# Patient Record
Sex: Male | Born: 1950 | Race: White | Hispanic: No | Marital: Married | Smoking: Former smoker
Health system: Southern US, Community
[De-identification: ages and names within clinical notes are randomized; demographics above are authoritative.]

## PROBLEM LIST (undated history)

## (undated) DIAGNOSIS — Z7982 Long term (current) use of aspirin: Secondary | ICD-10-CM

## (undated) DIAGNOSIS — I700 Atherosclerosis of aorta: Secondary | ICD-10-CM

## (undated) DIAGNOSIS — N186 End stage renal disease: Secondary | ICD-10-CM

## (undated) DIAGNOSIS — E119 Type 2 diabetes mellitus without complications: Secondary | ICD-10-CM

## (undated) DIAGNOSIS — Z7901 Long term (current) use of anticoagulants: Secondary | ICD-10-CM

## (undated) DIAGNOSIS — E7800 Pure hypercholesterolemia, unspecified: Secondary | ICD-10-CM

## (undated) DIAGNOSIS — D631 Anemia in chronic kidney disease: Secondary | ICD-10-CM

## (undated) DIAGNOSIS — N189 Chronic kidney disease, unspecified: Secondary | ICD-10-CM

## (undated) DIAGNOSIS — K7689 Other specified diseases of liver: Secondary | ICD-10-CM

## (undated) DIAGNOSIS — R160 Hepatomegaly, not elsewhere classified: Secondary | ICD-10-CM

## (undated) DIAGNOSIS — I10 Essential (primary) hypertension: Secondary | ICD-10-CM

## (undated) DIAGNOSIS — R011 Cardiac murmur, unspecified: Secondary | ICD-10-CM

## (undated) DIAGNOSIS — M109 Gout, unspecified: Secondary | ICD-10-CM

## (undated) DIAGNOSIS — Q245 Malformation of coronary vessels: Secondary | ICD-10-CM

## (undated) DIAGNOSIS — N281 Cyst of kidney, acquired: Secondary | ICD-10-CM

## (undated) DIAGNOSIS — I200 Unstable angina: Secondary | ICD-10-CM

## (undated) DIAGNOSIS — I4891 Unspecified atrial fibrillation: Secondary | ICD-10-CM

## (undated) DIAGNOSIS — I219 Acute myocardial infarction, unspecified: Secondary | ICD-10-CM

## (undated) DIAGNOSIS — I209 Angina pectoris, unspecified: Secondary | ICD-10-CM

## (undated) DIAGNOSIS — I6789 Other cerebrovascular disease: Secondary | ICD-10-CM

## (undated) DIAGNOSIS — K219 Gastro-esophageal reflux disease without esophagitis: Secondary | ICD-10-CM

## (undated) DIAGNOSIS — F32A Depression, unspecified: Secondary | ICD-10-CM

## (undated) DIAGNOSIS — I480 Paroxysmal atrial fibrillation: Secondary | ICD-10-CM

## (undated) DIAGNOSIS — I2510 Atherosclerotic heart disease of native coronary artery without angina pectoris: Secondary | ICD-10-CM

## (undated) DIAGNOSIS — F329 Major depressive disorder, single episode, unspecified: Secondary | ICD-10-CM

## (undated) DIAGNOSIS — N183 Chronic kidney disease, stage 3 unspecified: Secondary | ICD-10-CM

## (undated) DIAGNOSIS — E785 Hyperlipidemia, unspecified: Secondary | ICD-10-CM

## (undated) DIAGNOSIS — K5790 Diverticulosis of intestine, part unspecified, without perforation or abscess without bleeding: Secondary | ICD-10-CM

## (undated) DIAGNOSIS — Z992 Dependence on renal dialysis: Secondary | ICD-10-CM

## (undated) DIAGNOSIS — D494 Neoplasm of unspecified behavior of bladder: Secondary | ICD-10-CM

## (undated) DIAGNOSIS — D649 Anemia, unspecified: Secondary | ICD-10-CM

## (undated) HISTORY — PX: CARDIAC CATHETERIZATION: SHX172

## (undated) HISTORY — PX: OTHER SURGICAL HISTORY: SHX169

## (undated) HISTORY — PX: CORONARY ANGIOPLASTY: SHX604

---

## 2001-01-25 DIAGNOSIS — I2510 Atherosclerotic heart disease of native coronary artery without angina pectoris: Secondary | ICD-10-CM

## 2001-01-25 DIAGNOSIS — I219 Acute myocardial infarction, unspecified: Secondary | ICD-10-CM

## 2001-01-25 DIAGNOSIS — I213 ST elevation (STEMI) myocardial infarction of unspecified site: Secondary | ICD-10-CM

## 2001-01-25 DIAGNOSIS — I2119 ST elevation (STEMI) myocardial infarction involving other coronary artery of inferior wall: Secondary | ICD-10-CM

## 2001-01-25 HISTORY — DX: ST elevation (STEMI) myocardial infarction of unspecified site: I21.3

## 2001-01-25 HISTORY — DX: Acute myocardial infarction, unspecified: I21.9

## 2001-01-25 HISTORY — DX: ST elevation (STEMI) myocardial infarction involving other coronary artery of inferior wall: I21.19

## 2001-01-25 HISTORY — DX: Atherosclerotic heart disease of native coronary artery without angina pectoris: I25.10

## 2002-08-31 ENCOUNTER — Encounter: Admission: RE | Admit: 2002-08-31 | Discharge: 2002-08-31 | Payer: Self-pay | Admitting: Orthopedic Surgery

## 2002-08-31 ENCOUNTER — Encounter: Payer: Self-pay | Admitting: Orthopedic Surgery

## 2002-09-04 ENCOUNTER — Ambulatory Visit (HOSPITAL_BASED_OUTPATIENT_CLINIC_OR_DEPARTMENT_OTHER): Admission: RE | Admit: 2002-09-04 | Discharge: 2002-09-05 | Payer: Self-pay | Admitting: Orthopedic Surgery

## 2003-08-26 ENCOUNTER — Other Ambulatory Visit: Payer: Self-pay

## 2006-03-01 ENCOUNTER — Ambulatory Visit: Payer: Self-pay | Admitting: Gastroenterology

## 2007-02-11 ENCOUNTER — Other Ambulatory Visit: Payer: Self-pay

## 2007-02-11 ENCOUNTER — Emergency Department: Payer: Self-pay | Admitting: Emergency Medicine

## 2007-02-11 DIAGNOSIS — I6381 Other cerebral infarction due to occlusion or stenosis of small artery: Secondary | ICD-10-CM

## 2007-02-11 HISTORY — DX: Other cerebral infarction due to occlusion or stenosis of small artery: I63.81

## 2008-02-16 ENCOUNTER — Inpatient Hospital Stay: Payer: Self-pay | Admitting: Internal Medicine

## 2008-06-05 ENCOUNTER — Emergency Department: Payer: Self-pay | Admitting: Emergency Medicine

## 2009-05-26 ENCOUNTER — Inpatient Hospital Stay: Payer: Self-pay | Admitting: Internal Medicine

## 2010-02-20 ENCOUNTER — Emergency Department: Payer: Self-pay | Admitting: Internal Medicine

## 2012-09-26 ENCOUNTER — Observation Stay: Payer: Self-pay | Admitting: Internal Medicine

## 2012-09-26 LAB — CBC
HCT: 34.6 % — ABNORMAL LOW (ref 40.0–52.0)
MCHC: 35.1 g/dL (ref 32.0–36.0)
RBC: 3.84 10*6/uL — ABNORMAL LOW (ref 4.40–5.90)
WBC: 10.8 10*3/uL — ABNORMAL HIGH (ref 3.8–10.6)

## 2012-09-26 LAB — COMPREHENSIVE METABOLIC PANEL
Albumin: 3.1 g/dL — ABNORMAL LOW (ref 3.4–5.0)
BUN: 16 mg/dL (ref 7–18)
Bilirubin,Total: 0.3 mg/dL (ref 0.2–1.0)
Calcium, Total: 9.2 mg/dL (ref 8.5–10.1)
Chloride: 103 mmol/L (ref 98–107)
EGFR (African American): >60
EGFR (Non-African Amer.): >60
Glucose: 135 mg/dL — ABNORMAL HIGH (ref 65–99)
Osmolality: 275 (ref 275–301)

## 2012-09-26 LAB — TROPONIN I
Troponin-I: <0.02 ng/mL
Troponin-I: <0.02 ng/mL

## 2012-09-27 LAB — HEMOGLOBIN A1C: Hemoglobin A1C: 6.4 % — ABNORMAL HIGH (ref 4.2–6.3)

## 2012-09-27 LAB — LIPID PANEL
Cholesterol: 194 mg/dL (ref 0–200)
HDL Cholesterol: 49 mg/dL (ref 40–60)
Ldl Cholesterol, Calc: 98 mg/dL (ref 0–100)
VLDL Cholesterol, Calc: 47 mg/dL — ABNORMAL HIGH (ref 5–40)

## 2012-09-27 LAB — TSH: Thyroid Stimulating Horm: 1.11 u[IU]/mL

## 2012-09-27 LAB — TROPONIN I: Troponin-I: <0.02 ng/mL

## 2013-01-15 ENCOUNTER — Ambulatory Visit: Payer: Self-pay | Admitting: Gastroenterology

## 2013-01-25 HISTORY — PX: HAND SURGERY: SHX662

## 2013-03-23 ENCOUNTER — Emergency Department: Payer: Self-pay | Admitting: Emergency Medicine

## 2013-03-23 LAB — COMPREHENSIVE METABOLIC PANEL
AST: 45 U/L — AB (ref 15–37)
Albumin: 3.1 g/dL — ABNORMAL LOW (ref 3.4–5.0)
Alkaline Phosphatase: 79 U/L
Anion Gap: 7 (ref 7–16)
BILIRUBIN TOTAL: 0.2 mg/dL (ref 0.2–1.0)
BUN: 22 mg/dL — AB (ref 7–18)
CALCIUM: 8.7 mg/dL (ref 8.5–10.1)
CREATININE: 1.38 mg/dL — AB (ref 0.60–1.30)
Chloride: 104 mmol/L (ref 98–107)
Co2: 27 mmol/L (ref 21–32)
EGFR (Non-African Amer.): 55 — ABNORMAL LOW
Glucose: 117 mg/dL — ABNORMAL HIGH (ref 65–99)
OSMOLALITY: 280 (ref 275–301)
POTASSIUM: 3.4 mmol/L — AB (ref 3.5–5.1)
SGPT (ALT): 51 U/L (ref 12–78)
Sodium: 138 mmol/L (ref 136–145)
Total Protein: 7.3 g/dL (ref 6.4–8.2)

## 2013-03-23 LAB — CBC WITH DIFFERENTIAL/PLATELET
BASOS PCT: 0.9 %
Basophil #: 0.1 10*3/uL (ref 0.0–0.1)
EOS PCT: 1 %
Eosinophil #: 0.1 10*3/uL (ref 0.0–0.7)
HCT: 38.5 % — ABNORMAL LOW (ref 40.0–52.0)
HGB: 13 g/dL (ref 13.0–18.0)
Lymphocyte #: 2.8 10*3/uL (ref 1.0–3.6)
Lymphocyte %: 28.8 %
MCH: 30.8 pg (ref 26.0–34.0)
MCHC: 33.6 g/dL (ref 32.0–36.0)
MCV: 92 fL (ref 80–100)
MONO ABS: 0.8 x10 3/mm (ref 0.2–1.0)
Monocyte %: 8.8 %
NEUTROS ABS: 5.9 10*3/uL (ref 1.4–6.5)
Neutrophil %: 60.5 %
Platelet: 227 10*3/uL (ref 150–440)
RBC: 4.21 10*6/uL — ABNORMAL LOW (ref 4.40–5.90)
RDW: 13.1 % (ref 11.5–14.5)
WBC: 9.7 10*3/uL (ref 3.8–10.6)

## 2013-03-23 LAB — TROPONIN I: Troponin-I: <0.02 ng/mL

## 2013-03-23 LAB — CK TOTAL AND CKMB (NOT AT ARMC)
CK, TOTAL: 198 U/L
CK-MB: 4.6 ng/mL — AB (ref 0.5–3.6)

## 2013-03-24 ENCOUNTER — Encounter (HOSPITAL_COMMUNITY): Payer: Self-pay

## 2013-03-24 ENCOUNTER — Inpatient Hospital Stay (HOSPITAL_COMMUNITY)
Admission: EM | Admit: 2013-03-24 | Discharge: 2013-03-28 | DRG: 087 | Disposition: A | Discharge status: Home / Self Care | Payer: Medicare Other | Source: Other Acute Inpatient Hospital | Attending: Neurosurgery | Admitting: Neurosurgery

## 2013-03-24 DIAGNOSIS — S020XXA Fracture of vault of skull, initial encounter for closed fracture: Principal | ICD-10-CM | POA: Diagnosis present

## 2013-03-24 DIAGNOSIS — I2510 Atherosclerotic heart disease of native coronary artery without angina pectoris: Secondary | ICD-10-CM | POA: Diagnosis present

## 2013-03-24 DIAGNOSIS — I10 Essential (primary) hypertension: Secondary | ICD-10-CM | POA: Diagnosis present

## 2013-03-24 DIAGNOSIS — M109 Gout, unspecified: Secondary | ICD-10-CM | POA: Diagnosis present

## 2013-03-24 DIAGNOSIS — E7800 Pure hypercholesterolemia, unspecified: Secondary | ICD-10-CM | POA: Diagnosis present

## 2013-03-24 DIAGNOSIS — S0990XA Unspecified injury of head, initial encounter: Secondary | ICD-10-CM | POA: Diagnosis present

## 2013-03-24 DIAGNOSIS — S066XAA Traumatic subarachnoid hemorrhage with loss of consciousness status unknown, initial encounter: Secondary | ICD-10-CM

## 2013-03-24 DIAGNOSIS — S0291XA Unspecified fracture of skull, initial encounter for closed fracture: Secondary | ICD-10-CM

## 2013-03-24 DIAGNOSIS — Z823 Family history of stroke: Secondary | ICD-10-CM

## 2013-03-24 DIAGNOSIS — S06300A Unspecified focal traumatic brain injury without loss of consciousness, initial encounter: Principal | ICD-10-CM

## 2013-03-24 DIAGNOSIS — W010XXA Fall on same level from slipping, tripping and stumbling without subsequent striking against object, initial encounter: Secondary | ICD-10-CM | POA: Diagnosis present

## 2013-03-24 DIAGNOSIS — Y9329 Activity, other involving ice and snow: Secondary | ICD-10-CM

## 2013-03-24 DIAGNOSIS — E119 Type 2 diabetes mellitus without complications: Secondary | ICD-10-CM | POA: Diagnosis present

## 2013-03-24 HISTORY — DX: Essential (primary) hypertension: I10

## 2013-03-24 HISTORY — DX: Acute myocardial infarction, unspecified: I21.9

## 2013-03-24 HISTORY — DX: Traumatic subarachnoid hemorrhage with loss of consciousness status unknown, initial encounter: S06.6XAA

## 2013-03-24 HISTORY — DX: Type 2 diabetes mellitus without complications: E11.9

## 2013-03-24 HISTORY — DX: Unspecified fracture of skull, initial encounter for closed fracture: S02.91XA

## 2013-03-24 LAB — BASIC METABOLIC PANEL
BUN: 22 mg/dL (ref 6–23)
CO2: 24 mEq/L (ref 19–32)
Calcium: 9.2 mg/dL (ref 8.4–10.5)
Chloride: 103 mEq/L (ref 96–112)
Creatinine, Ser: 1.05 mg/dL (ref 0.50–1.35)
GFR calc Af Amer: 87 mL/min — ABNORMAL LOW (ref >90)
GFR calc non Af Amer: 75 mL/min — ABNORMAL LOW (ref >90)
Glucose, Bld: 150 mg/dL — ABNORMAL HIGH (ref 70–99)
Potassium: 3.8 mEq/L (ref 3.7–5.3)
Sodium: 142 mEq/L (ref 137–147)

## 2013-03-24 LAB — CBC
HCT: 34.1 % — ABNORMAL LOW (ref 39.0–52.0)
Hemoglobin: 11.9 g/dL — ABNORMAL LOW (ref 13.0–17.0)
MCH: 31.2 pg (ref 26.0–34.0)
MCHC: 34.9 g/dL (ref 30.0–36.0)
MCV: 89.5 fL (ref 78.0–100.0)
Platelets: 197 10*3/uL (ref 150–400)
RBC: 3.81 MIL/uL — ABNORMAL LOW (ref 4.22–5.81)
RDW: 12.9 % (ref 11.5–15.5)
WBC: 11.4 10*3/uL — ABNORMAL HIGH (ref 4.0–10.5)

## 2013-03-24 LAB — PROTIME-INR
INR: 1.1
Prothrombin Time: 13.7 secs (ref 11.5–14.7)

## 2013-03-24 LAB — MRSA PCR SCREENING: MRSA by PCR: NEGATIVE

## 2013-03-24 MED ORDER — ONDANSETRON HCL 4 MG PO TABS
4.0000 mg | ORAL_TABLET | Freq: Four times a day (QID) | ORAL | Status: DC | PRN
  Administered 2013-03-27 (×2): 4 mg via ORAL
  Filled 2013-03-24 (×3): qty 1

## 2013-03-24 MED ORDER — LISINOPRIL 2.5 MG PO TABS
2.5000 mg | ORAL_TABLET | Freq: Every day | ORAL | Status: DC
  Administered 2013-03-24 – 2013-03-28 (×5): 2.5 mg via ORAL
  Filled 2013-03-24 (×5): qty 1

## 2013-03-24 MED ORDER — METFORMIN HCL ER 500 MG PO TB24
500.0000 mg | ORAL_TABLET | Freq: Every day | ORAL | Status: DC
  Administered 2013-03-24 – 2013-03-28 (×5): 500 mg via ORAL
  Filled 2013-03-24 (×6): qty 1

## 2013-03-24 MED ORDER — SODIUM CHLORIDE 0.9 % IV SOLN
INTRAVENOUS | Status: DC
  Administered 2013-03-24 – 2013-03-27 (×5): via INTRAVENOUS
  Filled 2013-03-24 (×6): qty 1000

## 2013-03-24 MED ORDER — HYDROCHLOROTHIAZIDE 25 MG PO TABS
25.0000 mg | ORAL_TABLET | Freq: Every day | ORAL | Status: DC
  Administered 2013-03-24 – 2013-03-28 (×5): 25 mg via ORAL
  Filled 2013-03-24 (×5): qty 1

## 2013-03-24 MED ORDER — ISOSORBIDE MONONITRATE ER 60 MG PO TB24
60.0000 mg | ORAL_TABLET | Freq: Every day | ORAL | Status: DC
  Administered 2013-03-24 – 2013-03-28 (×5): 60 mg via ORAL
  Filled 2013-03-24 (×5): qty 1

## 2013-03-24 MED ORDER — ONDANSETRON 8 MG/NS 50 ML IVPB: 8.0000 mg | Freq: Four times a day (QID) | INTRAVENOUS | Status: DC | PRN

## 2013-03-24 MED ORDER — PANTOPRAZOLE SODIUM 40 MG PO TBEC
40.0000 mg | DELAYED_RELEASE_TABLET | Freq: Every day | ORAL | Status: DC
  Administered 2013-03-24 – 2013-03-28 (×5): 40 mg via ORAL
  Filled 2013-03-24 (×5): qty 1

## 2013-03-24 MED ORDER — ONDANSETRON HCL 4 MG/2ML IJ SOLN: 4.0000 mg | Freq: Four times a day (QID) | INTRAMUSCULAR | Status: DC | PRN

## 2013-03-24 MED ORDER — ALLOPURINOL 100 MG PO TABS
100.0000 mg | ORAL_TABLET | Freq: Every day | ORAL | Status: DC
  Administered 2013-03-24 – 2013-03-28 (×5): 100 mg via ORAL
  Filled 2013-03-24 (×5): qty 1

## 2013-03-24 MED ORDER — ACETAMINOPHEN 325 MG PO TABS
650.0000 mg | ORAL_TABLET | ORAL | Status: DC | PRN
  Administered 2013-03-25: 650 mg via ORAL
  Filled 2013-03-24: qty 2

## 2013-03-24 MED ORDER — ONDANSETRON HCL 4 MG/2ML IJ SOLN
4.0000 mg | Freq: Four times a day (QID) | INTRAMUSCULAR | Status: DC | PRN
  Administered 2013-03-24 – 2013-03-26 (×8): 4 mg via INTRAVENOUS
  Filled 2013-03-24 (×8): qty 2

## 2013-03-24 MED ORDER — ATORVASTATIN CALCIUM 40 MG PO TABS
40.0000 mg | ORAL_TABLET | Freq: Every day | ORAL | Status: DC
  Administered 2013-03-24 – 2013-03-27 (×4): 40 mg via ORAL
  Filled 2013-03-24 (×5): qty 1

## 2013-03-24 MED ORDER — METOPROLOL SUCCINATE ER 100 MG PO TB24
100.0000 mg | ORAL_TABLET | Freq: Every day | ORAL | Status: DC
  Administered 2013-03-24 – 2013-03-28 (×5): 100 mg via ORAL
  Filled 2013-03-24 (×5): qty 1

## 2013-03-24 MED ORDER — ONDANSETRON HCL 4 MG PO TABS: 4.0000 mg | ORAL_TABLET | Freq: Four times a day (QID) | ORAL | Status: DC | PRN

## 2013-03-24 MED ORDER — SERTRALINE HCL 100 MG PO TABS
200.0000 mg | ORAL_TABLET | Freq: Every day | ORAL | Status: DC
  Administered 2013-03-24 – 2013-03-28 (×5): 200 mg via ORAL
  Filled 2013-03-24 (×5): qty 2

## 2013-03-24 MED ORDER — MORPHINE SULFATE 2 MG/ML IJ SOLN: 1.0000 mg | INTRAMUSCULAR | Status: DC | PRN

## 2013-03-24 MED ORDER — HYDROCODONE-ACETAMINOPHEN 5-325 MG PO TABS
1.0000 | ORAL_TABLET | ORAL | Status: DC | PRN
  Administered 2013-03-24 (×4): 1 via ORAL
  Administered 2013-03-25: 2 via ORAL
  Administered 2013-03-25 – 2013-03-26 (×5): 1 via ORAL
  Filled 2013-03-24 (×3): qty 1
  Filled 2013-03-24: qty 2
  Filled 2013-03-24 (×7): qty 1

## 2013-03-24 NOTE — H&P (Signed)
Subjective: Patient is a 63 y.o. male who is admitted for treatment of closed head injury.  Patient was transferred from [facility] emergency room to the [facility] neurosurgical intensive care unit.  Patient explains that earlier this evening she slipped on the ice in his driveway and fell backwards striking the back of the right side of his head. He was evaluated in the emergency room by Dr. Harvest Dark, the emergency room physician. Workup included a CT scan of the head without contrast and a CT scan of the cervical spine. CT of the head showed a right parietal nondisplaced skull fracture and a small amount of left frontal traumatic subarachnoid hemorrhage consistent with a contrecoup injury.  CT scan of the cervical spine showed degenerative changes, but no evidence of fracture dislocation. Transfer to the neurosurgical service was requested.  Symptomatically the patient complains of pain in the right posterior aspect of the head, as well as nausea and vomiting. He was given Zofran 4 mg IV about 3 hours ago. He denies loss of consciousness associated with the fall and head injury. He denies weakness, seizures, or other neurologic phenomena.  Past medical history: Notable for history of hypertension, hypercholesterolemia, myocardial infarction, diabetes, and gout. He does not describe any history of cancer, stroke, or lung disease. Past surgical history: Notable for cardiac stents placed at the time of his myocardial infarction. Allergies: Patient denies allergies to medications. Medications: Allopurinol 100 mg daily, aspirin 81 mg daily, Crestor 20 mg daily, fish oil 1000mg  daily, HCTZ 25 mg daily, isosorbide mononitrate 60 mg daily, lisinopril 2.5 mg daily, metformin 500 mg daily, metoprolol ER 100 mg daily, omeprazole 20 mg daily, sertraline 200 mg daily.  Family history: Parents passed on, father suffered a stroke.  Social history: Patient is married, retired,  doesn't smoke tobacco, doesn't drink alcoholic beverages.  Review of Systems:  Notable for those difficulties described in his history of present illness and past history, but otherwise unremarkable.  Objective: Vital signs in last 24 hours: Temp:  [99.6 F (37.6 C)] 99.6 F (37.6 C) (02/28 0250) Pulse Rate:  [80-97] 80 (02/28 0300) Resp:  [18-19] 19 (02/28 0300) BP: (157-166)/(85) 166/85 mmHg (02/28 0300) SpO2:  [95 %-96 %] 96 % (02/28 0300) Weight:  [92.3 kg (203 lb 7.8 oz)] 92.3 kg (203 lb 7.8 oz) (02/28 0250)  EXAM: Patient a well-developed well nourished white male in no acute distress. Lungs are clear to auscultation , the patient has symmetrical respiratory excursion. Heart has a regular rate and rhythm normal S1 and S2 no murmur.   Abdomen is soft nontender nondistended bowel sounds are present. Extremity examination shows no clubbing cyanosis or edema.  Neurologic examination: Mental status examination:  Patient awake, alert, oriented to name, [facility] hospital, February 2015. Speech is fluent, he has good comprehension. He follows commands. Cranial nerves examination:  Pupils are equal, round, reactive to light, and about 3 mm bilaterally EOMI. Facial sensation intact. Facial movements symmetrical. Hearing present bilaterally. Shoulder shrug symmetrical. Palatal movement symmetrical. Tongue midline. Motor examination:  Patient is 5/5 strength in the upper and lower extremities. He has no drift of the upper extremities. Sensory examination: Intact to pinprick in the upper and lower extremities. Reflex examination: Symmetrical in the upper and lower extremities. Toes downgoing. Gait and stance examination: Not tested due to the nature the patient's condition.  Assessment/Plan: Patient who fell earlier this evening, striking the back of his head, not suffer loss of consciousness, but sustaining  a right parietal skull fracture, and a contrecoup injury with left frontal traumatic  subarachnoid hemorrhage. Neurologically intact on examination. He has suffered a closed head injury with a skull fracture or traumatic subarachnoid hemorrhage, with a Glasgow Coma Scale of 15/15.  He has a history of hypertension, hypercholesterolemia, coronary artery disease, diabetes, and gout.  Patient is admitted for supportive care with IV fluids, antiemetics, etc.  Repeat CT of the brain without contrast will be done tomorrow.   Hosie Spangle, MD 03/24/2013 3:18 AM

## 2013-03-25 ENCOUNTER — Inpatient Hospital Stay (HOSPITAL_COMMUNITY): Payer: Medicare Other

## 2013-03-25 NOTE — Progress Notes (Addendum)
Pt was complaining of headache and increased nausea after Tylenol and Zofran were already given. Pain was reassessed to be 5/10. Gave patient 1 Vicodin (5/325 mg tab) and prepared him to transfer to 4N. When patient was set up at the side of the bed, his nausea increased and he vomited. Dr. Sherwood Gambler was contacted and the patient's transfer orders were cancelled. Will continue to monitor patient and update as needed.

## 2013-03-25 NOTE — Progress Notes (Signed)
Subjective: Patient comfortable.  Nausea and vomiting has eased, but with even limited motion that he has significant nausea, and he has been very hesitant to get out of bed to the bathroom, and instead has been using a urinal. CT this morning shows some clearing of minimal traumatic subarachnoid hemorrhage.  Objective: Vital signs in last 24 hours: Filed Vitals:   03/25/13 0400 03/25/13 0500 03/25/13 0600 03/25/13 0700  BP: 121/70 124/74 126/66 123/64  Pulse: 69 65 65 63  Temp:      TempSrc:      Resp: 18 20 15 17   Height:      Weight:      SpO2: 94% 92% 91% 91%    Intake/Output from previous day: 02/28 0701 - 03/01 0700 In: 3340 [P.O.:940; I.V.:2400] Out: 680 [Urine:680] Intake/Output this shift:    Physical Exam:  Awake alert, oriented. Following commands. Moving all 4 extremities well.  CBC  Recent Labs  03/24/13 0514  WBC 11.4*  HGB 11.9*  HCT 34.1*  PLT 197   BMET  Recent Labs  03/24/13 0514  NA 142  K 3.8  CL 103  CO2 24  GLUCOSE 150*  BUN 22  CREATININE 1.05  CALCIUM 9.2    Studies/Results: Ct Head Wo Contrast  03/25/2013   CLINICAL DATA:  Status post fall; hit the posterior aspect of the head.  EXAM: CT HEAD WITHOUT CONTRAST  TECHNIQUE: Contiguous axial images were obtained from the base of the skull through the vertex without intravenous contrast.  COMPARISON:  CT of the head performed at [facility] on 03/23/2013  FINDINGS: There is apparent trace subarachnoid hemorrhage along the left frontal lobe, suspicious for a contrecoup injury.  The posterior fossa, including the cerebellum, brainstem and fourth ventricle, is within normal limits. The third and lateral ventricles, and basal ganglia are unremarkable in appearance. The cerebral hemispheres demonstrate grossly normal gray-white differentiation. No mass effect or midline shift is seen.  There is a nearly nondisplaced fracture extending across the right parietal calvarium and right  lateral occiput, extending to the medial aspect of the mastoid process along the base of the skull. The orbits are within normal limits. The paranasal sinuses and mastoid air cells are well-aerated. Minimal soft tissue swelling is noted overlying the right parietal calvarium.  IMPRESSION: 1. Apparent trace subarachnoid hemorrhage along the left frontal lobe, suspicious for a contrecoup injury. This is grossly unchanged from the prior outside study. 2. Nearly nondisplaced fracture extending across the right parietal calvarium and right lateral occiput, extending to the medial aspect of the mastoid process along the base of the skull. 3. Minimal soft tissue swelling overlying the right parietal calvarium.  These results were called by telephone at the time of interpretation on 03/25/2013 at 5:22 AM to Nursing on [facility], who verbally acknowledged these results.   Electronically Signed   By: Garald Balding M.D.   On: 03/25/2013 05:22    Assessment/Plan: Stable neurologically. CT improved. Suspect vestibular concussion, if nausea with motion does not resolve, will consult ENT. We'll progress to out of bed and ambulation as tolerated. Taking clear liquids well, will advance to carb modified diet as tolerated. Since taking liquids well, we'll decrease IVF to 50 cc an hour.   Hosie Spangle, MD 03/25/2013, 7:45 AM

## 2013-03-26 NOTE — Progress Notes (Signed)
Pt walked another lap around the unit (with 1 assist) and pt states he is feeling better at this time.  No complications.  Will continue to monitor.

## 2013-03-26 NOTE — Progress Notes (Signed)
Pt. States he is feeling better today with minimal nausea and pain.  Pt able to get up to chair with no complications and 1 assist.  Will continue to monitor pt.

## 2013-03-26 NOTE — Progress Notes (Signed)
UR completed.  Tahira Olivarez, RN BSN [facility] CCM Trauma/Neuro ICU Case Manager [phone]  

## 2013-03-26 NOTE — Progress Notes (Signed)
Subjective: Patient sitting up in chair, reports that he is able to turn his head and move about more easily. The nausea and vomiting that he experienced intensely yesterday afternoon with movement has eased. However he is not yet ambulating much. Appetite is limited as his by mouth intake.  Objective: Vital signs in last 24 hours: Filed Vitals:   03/26/13 0800 03/26/13 0900 03/26/13 0952 03/26/13 1142  BP: 164/93 166/89    Pulse: 67 64 80   Temp:    98.3 F (36.8 C)  TempSrc:    Oral  Resp: 25 20    Height:      Weight:      SpO2: 95% 92%      Intake/Output from previous day: 03/01 0701 - 03/02 0700 In: [id number] [P.O.:250; I.V.:1225] Out: 1950 [Urine:1950] Intake/Output this shift: Total I/O In: 200 [I.V.:200] Out: 600 [Urine:600]  Physical Exam:  Awake alert, oriented. Following commands. Speech fluent. Moving all 4 extremities well.  CBC  Recent Labs  03/24/13 0514  WBC 11.4*  HGB 11.9*  HCT 34.1*  PLT 197   BMET  Recent Labs  03/24/13 0514  NA 142  K 3.8  CL 103  CO2 24  GLUCOSE 150*  BUN 22  CREATININE 1.05  CALCIUM 9.2    Studies/Results: Ct Head Wo Contrast  03/25/2013   CLINICAL DATA:  Status post fall; hit the posterior aspect of the head.  EXAM: CT HEAD WITHOUT CONTRAST  TECHNIQUE: Contiguous axial images were obtained from the base of the skull through the vertex without intravenous contrast.  COMPARISON:  CT of the head performed at [facility] on 03/23/2013  FINDINGS: There is apparent trace subarachnoid hemorrhage along the left frontal lobe, suspicious for a contrecoup injury.  The posterior fossa, including the cerebellum, brainstem and fourth ventricle, is within normal limits. The third and lateral ventricles, and basal ganglia are unremarkable in appearance. The cerebral hemispheres demonstrate grossly normal gray-white differentiation. No mass effect or midline shift is seen.  There is a nearly nondisplaced fracture extending  across the right parietal calvarium and right lateral occiput, extending to the medial aspect of the mastoid process along the base of the skull. The orbits are within normal limits. The paranasal sinuses and mastoid air cells are well-aerated. Minimal soft tissue swelling is noted overlying the right parietal calvarium.  IMPRESSION: 1. Apparent trace subarachnoid hemorrhage along the left frontal lobe, suspicious for a contrecoup injury. This is grossly unchanged from the prior outside study. 2. Nearly nondisplaced fracture extending across the right parietal calvarium and right lateral occiput, extending to the medial aspect of the mastoid process along the base of the skull. 3. Minimal soft tissue swelling overlying the right parietal calvarium.  These results were called by telephone at the time of interpretation on 03/25/2013 at 5:22 AM to Nursing on [facility], who verbally acknowledged these results.   Electronically Signed   By: Garald Balding M.D.   On: 03/25/2013 05:22    Assessment/Plan: Have encouraged patient and his nurse to begin ambulation in the ICU. If he tolerates that well we may be able to transfer to a MedSurg unit.   Hosie Spangle, MD 03/26/2013, 12:42 PM

## 2013-03-26 NOTE — Progress Notes (Signed)
Pt stated he was feeling a little "queezy" 4mg  Zofran given and 1 tab of Norco given.  After medicine kicked in a little pt agreed to walk a little.  Pt walked 1 lap around unit with no complications.  Will continue to monitor.

## 2013-03-27 NOTE — Progress Notes (Signed)
Walked pt twice around the unit at 1700.  He tolerated it will no complaints of nausea.

## 2013-03-27 NOTE — Progress Notes (Signed)
Subjective: Patient overall improving. Was able to sit up yesterday, and ambulate in the halls. However moderate anorexia persists. Does better with liquids and solids.  Objective: Vital signs in last 24 hours: Filed Vitals:   03/27/13 0400 03/27/13 0500 03/27/13 0600 03/27/13 0700  BP: 165/80 162/78 160/77 160/85  Pulse: 61 62 61 64  Temp: 99.2 F (37.3 C)     TempSrc: Oral     Resp: 20 21 19 20   Height:      Weight:      SpO2: 94% 91% 90% 93%    Intake/Output from previous day: 03/02 0701 - 03/03 0700 In: 1050 [I.V.:1050] Out: 1725 [Urine:1725] Intake/Output this shift:    Physical Exam:  Awake alert, oriented. Following commands. Moving all 4 extremities well.  Assessment/Plan: We'll change IV to saline lock. Encourage by mouth intake. Encourage ambulation in ICU.   Hosie Spangle, MD 03/27/2013, 7:25 AM

## 2013-03-28 NOTE — Progress Notes (Signed)
Pt walked three times around the unit (1 assist). Pt had no complaints and states he is "feeling good:Marland Kitchen Will continue to monitor.

## 2013-03-28 NOTE — Discharge Summary (Signed)
Physician Discharge Summary  Patient ID: Casey French MRN: [id number] DOB/AGE: October 19, 1950 63 y.o.  Admit date: 03/24/2013 Discharge date: 03/28/2013  Admission Diagnoses:  Closed head injury with linear skull fracture and traumatic subarachnoid hemorrhage; vestibular concussion  Discharge Diagnoses:  Closed head injury with linear skull fracture and traumatic subarachnoid hemorrhage; vestibular concussion  Active Problems:   Closed head injury  Discharged Condition: good  Hospital Course: Patient fell and slipped on the ice 5 days ago, striking the back of his head. Patient was evaluated in the emergency room and found to have a linear skull fracture and a small amount of traumatic subarachnoid hemorrhage. Patient was transferred from [place] emergency room and has been managed in the neurosurgical intensive care. He has had significant difficulties with nausea and vomiting, typically precipitated by even minimal movement of the head and neck. Repeat CT scan of the head showed clearing of the small amount of traumatic subarachnoid hemorrhage. Patient's been supported with IV fluids and Zofran, and gradually the vestibular concussion symptoms have diminished and resolved. At this point is up and ambulate actively in the halls of the ICU, he seating a regular diet. He is being discharged home with instructions regarding activities and followup. He is to return to my office in 2 weeks with a CT the brain without contrast that day. He is to avoid heavy strenuous activities. He is not to drive until I see him back in 2 weeks. He is to use Tylenol when necessary for headaches.  Discharge Exam: Blood pressure 126/79, pulse 62, temperature 97.9 F (36.6 C), temperature source Oral, resp. rate 19, height 5\' 9"  (1.753 m), weight 92.3 kg (203 lb 7.8 oz), SpO2 94.00%.  Disposition: Home     Medication List         allopurinol 100 MG tablet  Commonly known as:  ZYLOPRIM  Take 100 mg by mouth  daily.     aspirin 81 MG tablet  Take 81 mg by mouth daily.     CRESTOR 20 MG tablet  Generic drug:  rosuvastatin  Take 20 mg by mouth daily.     Fish Oil 1000 MG Caps  Take 1,000 mg by mouth daily.     hydrochlorothiazide 25 MG tablet  Commonly known as:  HYDRODIURIL  Take 37.5 mg by mouth daily.     isosorbide mononitrate 60 MG 24 hr tablet  Commonly known as:  IMDUR  Take 60 mg by mouth daily.     lisinopril 2.5 MG tablet  Commonly known as:  PRINIVIL,ZESTRIL  Take 2.5 mg by mouth daily.     metFORMIN 500 MG 24 hr tablet  Commonly known as:  GLUCOPHAGE-XR  Take 500 mg by mouth daily.     metoprolol succinate 100 MG 24 hr tablet  Commonly known as:  TOPROL-XL  Take 100 mg by mouth daily.     omeprazole 20 MG capsule  Commonly known as:  PRILOSEC  Take 20 mg by mouth daily.     sertraline 100 MG tablet  Commonly known as:  ZOLOFT  Take 200 mg by mouth at bedtime.         Signed: Hosie Spangle, MD 03/28/2013, 8:23 AM

## 2013-03-28 NOTE — Progress Notes (Signed)
D/C'd IV. Pt being discharged home with his wife. Discharged instructions reviewed and signed; placed in patient's chart. Answered all questions. Care Plan and Patient Education has been completed.

## 2013-04-11 ENCOUNTER — Other Ambulatory Visit: Payer: Self-pay | Admitting: Neurosurgery

## 2013-04-11 DIAGNOSIS — I609 Nontraumatic subarachnoid hemorrhage, unspecified: Secondary | ICD-10-CM

## 2013-04-16 ENCOUNTER — Ambulatory Visit
Admission: RE | Admit: 2013-04-16 | Discharge: 2013-04-16 | Disposition: A | Discharge status: Home / Self Care | Payer: 59 | Source: Ambulatory Visit | Attending: Neurosurgery | Admitting: Neurosurgery

## 2013-04-16 DIAGNOSIS — I609 Nontraumatic subarachnoid hemorrhage, unspecified: Secondary | ICD-10-CM

## 2013-08-30 DIAGNOSIS — R0602 Shortness of breath: Secondary | ICD-10-CM | POA: Insufficient documentation

## 2013-08-30 DIAGNOSIS — W19XXXA Unspecified fall, initial encounter: Secondary | ICD-10-CM | POA: Insufficient documentation

## 2013-08-30 DIAGNOSIS — Y92009 Unspecified place in unspecified non-institutional (private) residence as the place of occurrence of the external cause: Secondary | ICD-10-CM | POA: Insufficient documentation

## 2014-03-18 DIAGNOSIS — R251 Tremor, unspecified: Secondary | ICD-10-CM | POA: Insufficient documentation

## 2014-05-17 NOTE — Consult Note (Signed)
PATIENT NAME:  Casey French, Casey French MR#:  [id number] DATE OF BIRTH:  Mar 18, 1950  DATE OF CONSULTATION:  09/26/2012  CARDIOLOGY CONSULTATION  REFERRING PHYSICIAN:  Dr. Bridgett Larsson CONSULTING PHYSICIAN:  Isaias Cowman, MD  PRIMARY CARE PHYSICIAN:  Dr. Kary Kos  CHIEF COMPLAINT:  Chest pain.   HISTORY OF PRESENT ILLNESS:  The patient is a 64 year old gentleman with known history of coronary artery disease status post prior coronary stents. The patient reports that he was in his usual state of health until this morning when he experienced left-sided chest discomfort. The patient reports chest discomfort when he takes a deep breath in. He describes it as a sharp discomfort rated 8/10. He presented to [facility] Emergency Room where EKG was nondiagnostic. Initial troponin was less than 0.02.   PAST MEDICAL HISTORY: 1.  Coronary artery disease, status post prior coronary stent.  2.  Hypertension.  3.  Hyperlipidemia.  4. Gastroesophageal reflux disease.  MEDICATIONS:  Lopressor 100 mg daily, lisinopril 2.5 mg daily, isosorbide 60 mg daily, hydrochlorothiazide 25 mg daily, Crestor 20 mg daily, sertraline 100 mg daily, omeprazole 20 mg daily, fish oil caps 1 daily, allopurinol 100 mg daily.   SOCIAL HISTORY: The patient is married and resides with his wife. He denies tobacco abuse.   FAMILY HISTORY: No immediate family history for coronary artery disease or myocardial infarction.  REVIEW OF SYSTEMS:   CONSTITUTIONAL:  No fever or chills.  EYES:  No blurry vision.  EARS:  No hearing loss.  RESPIRATORY:  No shortness of breath.  CARDIOVASCULAR:  Pleuritic-type chest pain as described above.  GASTROINTESTINAL:  No nausea, vomiting or diarrhea.  GENITOURINARY:  No dysuria or hematuria.  ENDOCRINE:  No polyuria or polydipsia.  MUSCULOSKELETAL:  No arthralgias or myalgias.  NEUROLOGICAL:  No focal muscle weakness or numbness.  PSYCHOLOGICAL:  No depression or anxiety.   PHYSICAL EXAMINATION: VITAL  SIGNS: Blood pressure 158/81, pulse 74, respirations 18, temperature 97.5, pulse oximetry 93%.  HEENT:  Pupils equal, reactive to light and accommodation.  NECK:  Supple without thyromegaly.  LUNGS:  Clear.  HEART:  Normal JVP. Normal PMI. Regular rate and rhythm. Normal S1, S2. No appreciable gallop, murmur or rub.  ABDOMEN:  Soft and nontender. Pulses were intact bilaterally.  MUSCULOSKELETAL:  Normal muscle tone.  NEUROLOGIC:  The patient is alert and oriented x 3. Motor and sensory both grossly intact.   IMPRESSION:  A 65 year old gentleman with known coronary artery disease as described above who presents with chest pain with atypical features. EKG is nondiagnostic. Initial troponin is negative.   RECOMMENDATIONS: 1.  Agree with overall current therapy.  2.  Would defer full dose anticoagulation at this time.  3.  Continue to cycle cardiac enzymes.  4.  Agree with Lexiscan sestamibi in the a.m.  ____________________________ Isaias Cowman, MD ap:ce D: 09/26/2012 13:44:00 ET T: 09/26/2012 13:53:42 ET JOB#: [id number]  cc: Isaias Cowman, MD, <Dictator> Isaias Cowman MD ELECTRONICALLY SIGNED 10/05/2012 7:59

## 2014-05-17 NOTE — Discharge Summary (Signed)
PATIENT NAME:  Casey French, Casey French MR#:  [id number] DATE OF BIRTH:  Jun 03, 1950  PRIMARY CARE PHYSICIAN: [name], MD.  DISCHARGE DIAGNOSES:  1.  Atypical chest pain on the left side.  2.  Right anterior 6th rib fracture.  3.  Diverticulosis.  4.  Hypertension.  5.  Diabetes.  6.  Coronary artery disease.  7.  Hyperlipidemia.   CONDITION: Stable.   CODE STATUS: FULL CODE.   HOME MEDICATIONS: Please refer to the medications in [facility] discharge instruction physician medication reconciliation list.   DIET: Low sodium, low fat, low cholesterol, ADA diet. Special instructions: No nuts.   ACTIVITY: As tolerated.   FOLLOW UP CARE: Follow with PCP within 1 to 2 weeks.   REASON FOR ADMISSION: Chest pain.   HOSPITAL COURSE: The patient is a 64 year old Caucasian male with a history of hypertension, diabetes, CAD, MI status post stent placement who presents in the ED with chest pain on the left backside, exacerbated by breathing movement. The patient also has mild shortness of breath.   For detailed history and physical examination, please refer to the admission note dictated by me. The patient was admitted for atypical chest pain to rule out ACS and needed to rule out nephrolithiasis. On admission, the patient has been treated with aspirin 325 mg p.o. with a statin and Lopressor, lisinopril. [name] evaluated the patient and suggested no further work-up. The patient is stable to be discharged and no ACS. The patient's troponin level is negative.   LABORATORY AND RADIOLOGICAL DATA: CAT scan of the abdomen and pelvis with stone protocol did not show any nephrolithiasis but has shown diverticulosis; advised the patient do not eat nuts.  For diabetes, we started a sliding scale. Checked hemoglobin A1c 6.4.   The patient has no symptoms. His vital signs are stable. He is clinically stable and will be discharged home today.   I discussed the patient's discharge plan with the patient, Dr.  Saralyn Pilar, the case manager and nurse.   TIME SPENT: About 33 minutes.    ____________________________ Demetrios Loll, MD qc:np D: 09/27/2012 13:40:28 ET T: 09/27/2012 15:25:07 ET JOB#: [id number]  cc: Demetrios Loll, MD, <Dictator> Demetrios Loll MD ELECTRONICALLY SIGNED 09/28/2012 15:35

## 2014-05-17 NOTE — H&P (Signed)
PATIENT NAME:  Casey French, Casey French MR#:  [id number] DATE OF BIRTH:  1950-03-31  DATE OF ADMISSION:  09/26/2012  PRIMARY CARE PHYSICIAN:  Dr. Kary Kos.   CHIEF COMPLAINT: Chest pain this morning.   HISTORY OF PRESENT ILLNESS: This is a 64 year old Caucasian male with a history of hypertension, diabetes,  CAD,  MI status post  stent placement presented to the ED with chest pain on the left side since this morning. The patient is alert, awake, oriented, in no acute distress. He had left chest pain on the back side which is 8 out of 10 sharp without radiation exacerbated by breathing and movement.  The patient also has shortness of breath while breathing.  The patient had 1 episode of nausea, vomiting and diarrhea. He denies any cough, sputum, no fever or chills. No orthopnea or nocturnal. No dyspnea or leg edema. No weight gain.  PAST MEDICAL HISTORY: CAD, status post a stent, hypertension, hyperlipidemia, GERD,  diabetes.   PAST SURGICAL HISTORY: Cardiac catheter in 2005 and 2003 with a stent placement.   SOCIAL HISTORY: No smoking or drinking or illicit drugs. Living with his wife.   ALLERGIES: None.  HOME MEDICATIONS: Sertraline 100 mg p.o. daily, omeprazole 20 mg p.o. daily, Lopressor 100 mg p.o. daily, lisinopril 2.5 mg p.o. daily, isosorbide 60 mg p.o. daily, HCTZ 12.5 mg p.o. daily, fish oil 1000 mg p.o. capsule 1 capsule a day, Crestor 20 mg p.o. daily, allopurinol 100 mg p.o. daily.   REVIEW OF SYSTEMS:  CONSTITUTIONAL: The patient denies any fever or chills. No headache or dizziness. No weakness.  EYES: No double vision or blurry vision.  ENT: No postnasal drip, slurred speech or dysphagia.  CARDIOVASCULAR: Positive for chest pain in the left backside. No palpitations. No orthopnea or nocturnal dyspnea. No leg edema.  PULMONARY: No cough, sputum, but has mild shortness of breath. No hemoptysis.  GASTROINTESTINAL: No abdominal pain, but has had some nausea, vomiting and diarrhea 1  episode. No melena or bloody stool.  GENITOURINARY: No dysuria, hematuria, or incontinence.  SKIN: No rash or jaundice.  HEMATOLOGIC: No easy bruising or bleeding.  ENDOCRINOLOGY: No polyuria, polydipsia, heat or cold intolerance.  NEUROLOGY: No syncope, loss of consciousness or seizure.   PHYSICAL EXAMINATION: VITAL SIGNS: Temperature 98, blood pressure was 180/104 and now is 142/82, pulse 75, respirations 18, oxygen saturation 96% on room air.  GENERAL: The patient is alert, awake, oriented, in no acute distress.  HEENT: Pupils round, equal and reactive to light and accommodation. Moist oral mucosa. Clear pharynx. NECK: Supple. No JVD or carotid bruits. No lymphadenopathy. No thyromegaly.  CARDIOVASCULAR: S1, S2 regular rate, rhythm. No murmurs or gallops.  PULMONARY: Bilateral air entry. No wheezing or rales. No use of accessory muscles to breathe.  ABDOMEN: Soft. No distention or tenderness. No organomegaly. Bowel sounds present.  EXTREMITIES: No edema, clubbing or cyanosis. No calf tenderness. Strong bilateral pedal pulses.  SKIN: No rash or jaundice.  NEUROLOGY: A and O x 3. No focal deficit. Power 5 out of 5. Sensation intact.   LABORATORY DATA: Showed WBC 10.8, hemoglobin 12.2, platelet 227. Glucose 135, BUN 16, creatinine 1.14. Electrolytes normal. Troponin less than 0.02. D-dimer 0.27. EKG showed normal sinus rhythm at 68 BPM.   IMPRESSIONS: 1.  Chest pain, atypical.  Need to rule out acute coronary syndrome.  2.  Also need to rule out nephrolithiasis.  3.  Hypertension.  4.  Diabetes.  5.  Coronary artery disease.  6.  Hyperlipidemia.  PLAN OF TREATMENT: 1.  The patient will be placed for observation. We will follow up troponin level, get a stress test and cardiology consult from [name].    2.  I will increase aspirin to 325 mg p.o. daily. Continue Crestor, Lopressor, lisinopril.  3.  For diabetes, we will start a insulin sliding scale and check hemoglobin A1c.  4.   GI and DVT prophylaxis. Discussed the patient's condition and plan of treatment with the patient and the patient's wife and daughter.   TIME SPENT: About 64 minutes.    ____________________________ Demetrios Loll, MD qc:dp D: 09/26/2012 11:44:22 ET T: 09/26/2012 12:19:22 ET JOB#: [id number]  cc: Demetrios Loll, MD, <Dictator> Demetrios Loll MD ELECTRONICALLY SIGNED 09/26/2012 15:03

## 2014-06-13 ENCOUNTER — Inpatient Hospital Stay
Admission: EM | Admit: 2014-06-13 | Discharge: 2014-06-16 | DRG: 305 | Disposition: A | Discharge status: Home / Self Care | Payer: Medicare Other | Attending: Internal Medicine | Admitting: Internal Medicine

## 2014-06-13 ENCOUNTER — Emergency Department: Payer: Medicare Other

## 2014-06-13 DIAGNOSIS — M109 Gout, unspecified: Secondary | ICD-10-CM | POA: Diagnosis present

## 2014-06-13 DIAGNOSIS — Z87891 Personal history of nicotine dependence: Secondary | ICD-10-CM

## 2014-06-13 DIAGNOSIS — K5732 Diverticulitis of large intestine without perforation or abscess without bleeding: Secondary | ICD-10-CM | POA: Diagnosis present

## 2014-06-13 DIAGNOSIS — I252 Old myocardial infarction: Secondary | ICD-10-CM

## 2014-06-13 DIAGNOSIS — Z79899 Other long term (current) drug therapy: Secondary | ICD-10-CM

## 2014-06-13 DIAGNOSIS — I10 Essential (primary) hypertension: Principal | ICD-10-CM | POA: Diagnosis present

## 2014-06-13 DIAGNOSIS — F329 Major depressive disorder, single episode, unspecified: Secondary | ICD-10-CM | POA: Diagnosis present

## 2014-06-13 DIAGNOSIS — E119 Type 2 diabetes mellitus without complications: Secondary | ICD-10-CM

## 2014-06-13 DIAGNOSIS — K219 Gastro-esophageal reflux disease without esophagitis: Secondary | ICD-10-CM | POA: Diagnosis present

## 2014-06-13 DIAGNOSIS — K5792 Diverticulitis of intestine, part unspecified, without perforation or abscess without bleeding: Secondary | ICD-10-CM | POA: Diagnosis present

## 2014-06-13 DIAGNOSIS — N179 Acute kidney failure, unspecified: Secondary | ICD-10-CM | POA: Diagnosis present

## 2014-06-13 DIAGNOSIS — R748 Abnormal levels of other serum enzymes: Secondary | ICD-10-CM | POA: Diagnosis present

## 2014-06-13 DIAGNOSIS — R1031 Right lower quadrant pain: Secondary | ICD-10-CM | POA: Diagnosis not present

## 2014-06-13 DIAGNOSIS — Z7982 Long term (current) use of aspirin: Secondary | ICD-10-CM

## 2014-06-13 DIAGNOSIS — I160 Hypertensive urgency: Secondary | ICD-10-CM | POA: Diagnosis present

## 2014-06-13 HISTORY — DX: Pure hypercholesterolemia, unspecified: E78.00

## 2014-06-13 HISTORY — DX: Gout, unspecified: M10.9

## 2014-06-13 HISTORY — DX: Depression, unspecified: F32.A

## 2014-06-13 HISTORY — DX: Major depressive disorder, single episode, unspecified: F32.9

## 2014-06-13 LAB — CBC
HEMATOCRIT: 41 % (ref 40.0–52.0)
HEMOGLOBIN: 14.2 g/dL (ref 13.0–18.0)
MCH: 30.9 pg (ref 26.0–34.0)
MCHC: 34.5 g/dL (ref 32.0–36.0)
MCV: 89.6 fL (ref 80.0–100.0)
Platelets: 285 10*3/uL (ref 150–440)
RBC: 4.57 MIL/uL (ref 4.40–5.90)
RDW: 14 % (ref 11.5–14.5)
WBC: 17.8 10*3/uL — AB (ref 3.8–10.6)

## 2014-06-13 LAB — TROPONIN I: Troponin I: 0.04 ng/mL — ABNORMAL HIGH (ref <0.031)

## 2014-06-13 LAB — LACTIC ACID, PLASMA: Lactic Acid, Venous: 1.7 mmol/L (ref 0.5–2.0)

## 2014-06-13 LAB — BASIC METABOLIC PANEL
Anion gap: 11 (ref 5–15)
BUN: 29 mg/dL — ABNORMAL HIGH (ref 6–20)
CALCIUM: 9.6 mg/dL (ref 8.9–10.3)
CO2: 26 mmol/L (ref 22–32)
CREATININE: 1.4 mg/dL — AB (ref 0.61–1.24)
Chloride: 101 mmol/L (ref 101–111)
GFR calc Af Amer: >60 mL/min (ref >60)
GFR calc non Af Amer: 52 mL/min — ABNORMAL LOW (ref >60)
Glucose, Bld: 131 mg/dL — ABNORMAL HIGH (ref 65–99)
Potassium: 3.9 mmol/L (ref 3.5–5.1)
Sodium: 138 mmol/L (ref 135–145)

## 2014-06-13 MED ORDER — IOHEXOL 300 MG/ML  SOLN
100.0000 mL | Freq: Once | INTRAMUSCULAR | Status: AC | PRN
  Administered 2014-06-13: 100 mL via INTRAVENOUS

## 2014-06-13 MED ORDER — IOHEXOL 240 MG/ML SOLN
25.0000 mL | Freq: Once | INTRAMUSCULAR | Status: AC | PRN
  Administered 2014-06-13: 25 mL via ORAL

## 2014-06-13 MED ORDER — MORPHINE SULFATE 4 MG/ML IJ SOLN
INTRAMUSCULAR | Status: AC
  Administered 2014-06-13: 4 mg via INTRAVENOUS
  Filled 2014-06-13: qty 1

## 2014-06-13 MED ORDER — ONDANSETRON HCL 4 MG/2ML IJ SOLN
INTRAMUSCULAR | Status: AC
  Administered 2014-06-13: 4 mg via INTRAVENOUS
  Filled 2014-06-13: qty 2

## 2014-06-13 MED ORDER — SODIUM CHLORIDE 0.9 % IV BOLUS (SEPSIS)
1000.0000 mL | Freq: Once | INTRAVENOUS | Status: AC
  Administered 2014-06-13: 1000 mL via INTRAVENOUS

## 2014-06-13 MED ORDER — ONDANSETRON HCL 4 MG/2ML IJ SOLN
4.0000 mg | Freq: Once | INTRAMUSCULAR | Status: AC
  Administered 2014-06-13: 4 mg via INTRAVENOUS

## 2014-06-13 MED ORDER — MORPHINE SULFATE 4 MG/ML IJ SOLN
4.0000 mg | Freq: Once | INTRAMUSCULAR | Status: AC
  Administered 2014-06-13: 4 mg via INTRAVENOUS

## 2014-06-13 NOTE — ED Notes (Signed)
Pt comes in complaining of abdominal pain, nausea, and vomiting. Pt unable to localize exact area of abdominal pain.

## 2014-06-13 NOTE — ED Notes (Signed)
Patient transported to CT 

## 2014-06-13 NOTE — ED Provider Notes (Signed)
[facility] Emergency Department Provider Note  ____________________________________________  Time seen: Approximately 9 PM  I have reviewed the triage vital signs and the nursing notes.   HISTORY  Chief Complaint Abdominal Pain    HPI Casey French is a 64 y.o. male with a history of hypertension and diabetes who presents today with 2 days of worsening right upper and lower quadrant abdominal pain. It is associated with nausea vomiting and diarrhea. The patient has had multiple episodes ofvomiting and is now having dry heaves. He did not note any blood in the vomitus or the stool. He says he is also having subjective fevers over the past 2 days. He is not eaten anything over the past days because of the vomiting and the nausea. Pain has been cramping and radiating to the right chest. He says that it doesn't go any further up than his right chest. The pain is been moderate to severe. Nothing makes the pain better or worse.   Past Medical History  Diagnosis Date  . Myocardial infarction   . Hypertension   . Diabetes mellitus without complication   . High cholesterol   . Depression   . Gout     Patient Active Problem List   Diagnosis Date Noted  . Closed head injury 03/24/2013    Past Surgical History  Procedure Laterality Date  . Cardiac catheterization    . Right hand surgery      3 screws in right hand    Current Outpatient Rx  Name  Route  Sig  Dispense  Refill  . allopurinol (ZYLOPRIM) 100 MG tablet   Oral   Take 100 mg by mouth daily.         Marland Kitchen aspirin 81 MG tablet   Oral   Take 81 mg by mouth daily.         . CRESTOR 20 MG tablet   Oral   Take 20 mg by mouth daily.         . hydrochlorothiazide (HYDRODIURIL) 25 MG tablet   Oral   Take 37.5 mg by mouth daily.          . isosorbide mononitrate (IMDUR) 60 MG 24 hr tablet   Oral   Take 60 mg by mouth daily.         Marland Kitchen lisinopril (PRINIVIL,ZESTRIL) 2.5 MG tablet  Oral   Take 2.5 mg by mouth daily.         . metFORMIN (GLUCOPHAGE-XR) 500 MG 24 hr tablet   Oral   Take 500 mg by mouth daily.         . metoprolol succinate (TOPROL-XL) 100 MG 24 hr tablet   Oral   Take 100 mg by mouth daily.         . Omega-3 Fatty Acids (FISH OIL) 1000 MG CAPS   Oral   Take 1,000 mg by mouth daily.         Marland Kitchen omeprazole (PRILOSEC) 20 MG capsule   Oral   Take 20 mg by mouth daily.         . sertraline (ZOLOFT) 100 MG tablet   Oral   Take 200 mg by mouth at bedtime.           Allergies Review of patient's allergies indicates no known allergies.  History reviewed. No pertinent family history.  Social History History  Substance Use Topics  . Smoking status: Former Smoker    Quit date: 03/24/2001  . Smokeless tobacco: Not  on file  . Alcohol Use: No    Review of Systems Constitutional: Subjective fever Eyes: No visual changes. ENT: No sore throat. Cardiovascular: Denies chest pain. Respiratory: Denies shortness of breath. Gastrointestinal: As above  Genitourinary: Negative for dysuria. Musculoskeletal: Negative for back pain. Skin: Negative for rash. Neurological: Negative for headaches, focal weakness or numbness.  10-point ROS otherwise negative.  ____________________________________________   PHYSICAL EXAM:  VITAL SIGNS: ED Triage Vitals  Enc Vitals Group     BP 06/13/14 1833 171/99 mmHg     Pulse Rate 06/13/14 1833 85     Resp 06/13/14 1833 22     Temp 06/13/14 1833 98.3 F (36.8 C)     Temp Source 06/13/14 1833 Oral     SpO2 06/13/14 1833 100 %     Weight 06/13/14 1833 174 lb (78.926 kg)     Height 06/13/14 1833 5\' 9"  (1.753 m)     Head Cir --      Peak Flow --      Pain Score 06/13/14 1833 10     Pain Loc --      Pain Edu? --      Excl. in [place]? --     Constitutional: Alert and oriented. Well appearing and in no acute distress. Eyes: Conjunctivae are normal. PERRL. EOMI. Head: Atraumatic. Nose: No  congestion/rhinnorhea. Mouth/Throat: Mucous membranes are moist.  Oropharynx non-erythematous. Neck: No stridor.   Cardiovascular: Normal rate, regular rhythm. Grossly normal heart sounds.  Good peripheral circulation. Respiratory: Normal respiratory effort.  No retractions. Lungs CTAB. Gastrointestinal: Soft with tenderness palpation of the right upper lower quadrants. There is negative Murphy sign however there is tenderness over the McBurney's point. There is no rebound or guarding. No distention.  Musculoskeletal: No lower extremity tenderness nor edema.  No joint effusions. Neurologic:  Normal speech and language. No gross focal neurologic deficits are appreciated. Speech is normal. No gait instability. Skin:  Skin is warm, dry and intact. No rash noted. Psychiatric: Mood and affect are normal. Speech and behavior are normal.  ____________________________________________   LABS (all labs ordered are listed, but only abnormal results are displayed)  Labs Reviewed  CBC - Abnormal; Notable for the following:    WBC 17.8 (*)    All other components within normal limits  BASIC METABOLIC PANEL - Abnormal; Notable for the following:    Glucose, Bld 131 (*)    BUN 29 (*)    Creatinine, Ser 1.40 (*)    GFR calc non Af Amer 52 (*)    All other components within normal limits  TROPONIN I   ____________________________________________  EKG  ED ECG REPORT I, Doran Stabler, the attending physician, personally viewed and interpreted this ECG.   Date: 06/13/2014  EKG Time: 1858  Rate: 77  Rhythm: normal EKG, normal sinus rhythm  Axis: Normal axis  Intervals:none  ST&T Change: No ST elevation or depressions. No T-wave inversions.  ____________________________________________  RADIOLOGY  Pending CAT scan of the abdomen and pelvis. ____________________________________________   PROCEDURES    ____________________________________________   INITIAL IMPRESSION /  ASSESSMENT AND PLAN / ED COURSE  Pertinent labs & imaging results that were available during my care of the patient were reviewed by me and considered in my medical decision making (see chart for details).  ----------------------------------------- 9:18 PM on 06/13/2014 -----------------------------------------  High suspicion for appendicitis or gallbladder disease. We'll give pain meds, antibiotics and fluids. Pending CAT scan of the abdomen. ____________________________________________  ----------------------------------------- 9:48 PM  on 06/13/2014 -----------------------------------------  Patient signed out to Dr. Karma Greaser.  We'll follow-up with patient's imaging. Patient does have an elevated troponin at .04. Feel like this is likely secondary to demand from intra-abdominal process. Plan is to follow up with imaging. Patient will be admitted to the hospital either for abdominal process or further cardiac management and workup. We'll hold further anticoagulation until CAT scan resulted in case patient will need to go to the operating room.  FINAL CLINICAL IMPRESSION(S) / ED DIAGNOSES  Acute abdominal pain, nausea, vomiting, diarrhea. Initial visit.    Orbie Pyo, MD 06/13/14 2156

## 2014-06-13 NOTE — ED Notes (Signed)
Pt also reports left sided neck pain that radiates up into head.

## 2014-06-14 ENCOUNTER — Encounter: Payer: Self-pay | Admitting: Internal Medicine

## 2014-06-14 DIAGNOSIS — I160 Hypertensive urgency: Secondary | ICD-10-CM | POA: Diagnosis present

## 2014-06-14 DIAGNOSIS — R1031 Right lower quadrant pain: Secondary | ICD-10-CM | POA: Diagnosis present

## 2014-06-14 DIAGNOSIS — R748 Abnormal levels of other serum enzymes: Secondary | ICD-10-CM | POA: Diagnosis present

## 2014-06-14 DIAGNOSIS — N179 Acute kidney failure, unspecified: Secondary | ICD-10-CM | POA: Diagnosis present

## 2014-06-14 DIAGNOSIS — I252 Old myocardial infarction: Secondary | ICD-10-CM | POA: Diagnosis not present

## 2014-06-14 DIAGNOSIS — K219 Gastro-esophageal reflux disease without esophagitis: Secondary | ICD-10-CM | POA: Insufficient documentation

## 2014-06-14 DIAGNOSIS — I10 Essential (primary) hypertension: Secondary | ICD-10-CM | POA: Diagnosis present

## 2014-06-14 DIAGNOSIS — E119 Type 2 diabetes mellitus without complications: Secondary | ICD-10-CM | POA: Diagnosis present

## 2014-06-14 DIAGNOSIS — Z79899 Other long term (current) drug therapy: Secondary | ICD-10-CM | POA: Diagnosis not present

## 2014-06-14 DIAGNOSIS — E785 Hyperlipidemia, unspecified: Secondary | ICD-10-CM | POA: Insufficient documentation

## 2014-06-14 DIAGNOSIS — F329 Major depressive disorder, single episode, unspecified: Secondary | ICD-10-CM | POA: Diagnosis present

## 2014-06-14 DIAGNOSIS — M109 Gout, unspecified: Secondary | ICD-10-CM | POA: Diagnosis present

## 2014-06-14 DIAGNOSIS — Z7982 Long term (current) use of aspirin: Secondary | ICD-10-CM | POA: Diagnosis not present

## 2014-06-14 DIAGNOSIS — I2510 Atherosclerotic heart disease of native coronary artery without angina pectoris: Secondary | ICD-10-CM | POA: Insufficient documentation

## 2014-06-14 DIAGNOSIS — K5792 Diverticulitis of intestine, part unspecified, without perforation or abscess without bleeding: Secondary | ICD-10-CM | POA: Diagnosis present

## 2014-06-14 DIAGNOSIS — Z87891 Personal history of nicotine dependence: Secondary | ICD-10-CM | POA: Diagnosis not present

## 2014-06-14 DIAGNOSIS — K5732 Diverticulitis of large intestine without perforation or abscess without bleeding: Secondary | ICD-10-CM | POA: Diagnosis present

## 2014-06-14 HISTORY — DX: Diverticulitis of intestine, part unspecified, without perforation or abscess without bleeding: K57.92

## 2014-06-14 LAB — CBC
HCT: 37.5 % — ABNORMAL LOW (ref 40.0–52.0)
HEMOGLOBIN: 12.6 g/dL — AB (ref 13.0–18.0)
MCH: 30.5 pg (ref 26.0–34.0)
MCHC: 33.7 g/dL (ref 32.0–36.0)
MCV: 90.6 fL (ref 80.0–100.0)
Platelets: 217 10*3/uL (ref 150–440)
RBC: 4.13 MIL/uL — ABNORMAL LOW (ref 4.40–5.90)
RDW: 13.8 % (ref 11.5–14.5)
WBC: 12.6 10*3/uL — ABNORMAL HIGH (ref 3.8–10.6)

## 2014-06-14 LAB — TROPONIN I
TROPONIN I: 0.05 ng/mL — AB (ref <0.031)
TROPONIN I: 0.04 ng/mL — AB (ref <0.031)
Troponin I: 0.05 ng/mL — ABNORMAL HIGH (ref <0.031)
Troponin I: 0.05 ng/mL — ABNORMAL HIGH (ref <0.031)

## 2014-06-14 LAB — CREATININE, SERUM
Creatinine, Ser: 1.21 mg/dL (ref 0.61–1.24)
GFR calc Af Amer: >60 mL/min (ref >60)
GFR calc non Af Amer: >60 mL/min (ref >60)

## 2014-06-14 LAB — GLUCOSE, CAPILLARY
GLUCOSE-CAPILLARY: 118 mg/dL — AB (ref 65–99)
Glucose-Capillary: 110 mg/dL — ABNORMAL HIGH (ref 65–99)
Glucose-Capillary: 108 mg/dL — ABNORMAL HIGH (ref 65–99)
Glucose-Capillary: 107 mg/dL — ABNORMAL HIGH (ref 65–99)

## 2014-06-14 MED ORDER — HYDRALAZINE HCL 20 MG/ML IJ SOLN
10.0000 mg | INTRAMUSCULAR | Status: DC | PRN
  Administered 2014-06-14 (×2): 10 mg via INTRAVENOUS
  Filled 2014-06-14: qty 1

## 2014-06-14 MED ORDER — METRONIDAZOLE 500 MG PO TABS
ORAL_TABLET | ORAL | Status: AC
  Administered 2014-06-14: 500 mg via ORAL
  Filled 2014-06-14: qty 1

## 2014-06-14 MED ORDER — CIPROFLOXACIN HCL 500 MG PO TABS
ORAL_TABLET | ORAL | Status: AC
  Administered 2014-06-14: 500 mg via ORAL
  Filled 2014-06-14: qty 1

## 2014-06-14 MED ORDER — HEPARIN SODIUM (PORCINE) 5000 UNIT/ML IJ SOLN
5000.0000 [IU] | Freq: Three times a day (TID) | INTRAMUSCULAR | Status: DC
  Administered 2014-06-14 – 2014-06-16 (×7): 5000 [IU] via SUBCUTANEOUS
  Filled 2014-06-14 (×7): qty 1

## 2014-06-14 MED ORDER — PANTOPRAZOLE SODIUM 40 MG PO TBEC
40.0000 mg | DELAYED_RELEASE_TABLET | Freq: Every day | ORAL | Status: DC
  Administered 2014-06-15 – 2014-06-16 (×2): 40 mg via ORAL
  Filled 2014-06-14 (×3): qty 1

## 2014-06-14 MED ORDER — ONDANSETRON HCL 4 MG/2ML IJ SOLN
4.0000 mg | INTRAMUSCULAR | Status: DC | PRN
  Administered 2014-06-14 – 2014-06-15 (×4): 4 mg via INTRAVENOUS
  Filled 2014-06-14 (×4): qty 2

## 2014-06-14 MED ORDER — PROMETHAZINE HCL 25 MG/ML IJ SOLN
12.5000 mg | Freq: Four times a day (QID) | INTRAMUSCULAR | Status: DC | PRN
  Administered 2014-06-14: 25 mg via INTRAVENOUS
  Administered 2014-06-15 (×2): 12.5 mg via INTRAVENOUS
  Filled 2014-06-14 (×3): qty 1

## 2014-06-14 MED ORDER — MORPHINE SULFATE 2 MG/ML IJ SOLN
INTRAMUSCULAR | Status: AC
  Administered 2014-06-14: 2 mg via INTRAVENOUS
  Filled 2014-06-14: qty 1

## 2014-06-14 MED ORDER — ONDANSETRON HCL 4 MG PO TABS: 4.0000 mg | ORAL_TABLET | ORAL | Status: DC

## 2014-06-14 MED ORDER — CIPROFLOXACIN HCL 500 MG PO TABS
500.0000 mg | ORAL_TABLET | Freq: Once | ORAL | Status: AC
  Administered 2014-06-14: 500 mg via ORAL

## 2014-06-14 MED ORDER — METRONIDAZOLE IN NACL 5-0.79 MG/ML-% IV SOLN
500.0000 mg | Freq: Three times a day (TID) | INTRAVENOUS | Status: DC
  Administered 2014-06-14 – 2014-06-16 (×7): 500 mg via INTRAVENOUS
  Filled 2014-06-14 (×11): qty 100

## 2014-06-14 MED ORDER — METRONIDAZOLE 500 MG PO TABS
500.0000 mg | ORAL_TABLET | Freq: Once | ORAL | Status: AC
  Administered 2014-06-14: 500 mg via ORAL

## 2014-06-14 MED ORDER — SODIUM CHLORIDE 0.9 % IV SOLN
INTRAVENOUS | Status: DC
  Administered 2014-06-14 – 2014-06-15 (×4): via INTRAVENOUS

## 2014-06-14 MED ORDER — INSULIN ASPART 100 UNIT/ML ~~LOC~~ SOLN
0.0000 [IU] | Freq: Three times a day (TID) | SUBCUTANEOUS | Status: DC
  Administered 2014-06-15: 1 [IU] via SUBCUTANEOUS
  Filled 2014-06-14: qty 1

## 2014-06-14 MED ORDER — MORPHINE SULFATE 2 MG/ML IJ SOLN
2.0000 mg | INTRAMUSCULAR | Status: DC | PRN
  Administered 2014-06-15: 2 mg via INTRAVENOUS
  Filled 2014-06-14: qty 1

## 2014-06-14 MED ORDER — MORPHINE SULFATE 2 MG/ML IJ SOLN
2.0000 mg | Freq: Once | INTRAMUSCULAR | Status: AC
  Administered 2014-06-14: 2 mg via INTRAVENOUS

## 2014-06-14 MED ORDER — INSULIN ASPART 100 UNIT/ML ~~LOC~~ SOLN: 0.0000 [IU] | Freq: Every day | SUBCUTANEOUS | Status: DC

## 2014-06-14 MED ORDER — ACETAMINOPHEN 650 MG RE SUPP: 650.0000 mg | Freq: Four times a day (QID) | RECTAL | Status: DC | PRN

## 2014-06-14 MED ORDER — ASPIRIN 81 MG PO CHEW
81.0000 mg | CHEWABLE_TABLET | Freq: Every day | ORAL | Status: DC
  Administered 2014-06-15 – 2014-06-16 (×2): 81 mg via ORAL
  Filled 2014-06-14 (×3): qty 1

## 2014-06-14 MED ORDER — METOPROLOL SUCCINATE ER 100 MG PO TB24
100.0000 mg | ORAL_TABLET | Freq: Every day | ORAL | Status: DC
  Administered 2014-06-15 – 2014-06-16 (×2): 100 mg via ORAL
  Filled 2014-06-14 (×4): qty 1

## 2014-06-14 MED ORDER — SODIUM CHLORIDE 0.9 % IJ SOLN
3.0000 mL | Freq: Two times a day (BID) | INTRAMUSCULAR | Status: DC
  Administered 2014-06-15 – 2014-06-16 (×2): 3 mL via INTRAVENOUS

## 2014-06-14 MED ORDER — ONDANSETRON HCL 4 MG/2ML IJ SOLN
4.0000 mg | INTRAMUSCULAR | Status: DC
  Administered 2014-06-14: 4 mg via INTRAVENOUS
  Filled 2014-06-14: qty 2

## 2014-06-14 MED ORDER — HYDRALAZINE HCL 20 MG/ML IJ SOLN
INTRAMUSCULAR | Status: AC
  Administered 2014-06-14: 10 mg via INTRAVENOUS
  Filled 2014-06-14: qty 1

## 2014-06-14 MED ORDER — CIPROFLOXACIN IN D5W 400 MG/200ML IV SOLN
400.0000 mg | Freq: Two times a day (BID) | INTRAVENOUS | Status: DC
  Administered 2014-06-14 – 2014-06-16 (×4): 400 mg via INTRAVENOUS
  Filled 2014-06-14 (×7): qty 200

## 2014-06-14 MED ORDER — ONDANSETRON HCL 4 MG/2ML IJ SOLN
4.0000 mg | Freq: Once | INTRAMUSCULAR | Status: AC
  Administered 2014-06-14: 4 mg via INTRAVENOUS

## 2014-06-14 MED ORDER — SERTRALINE HCL 50 MG PO TABS
200.0000 mg | ORAL_TABLET | Freq: Every day | ORAL | Status: DC
  Administered 2014-06-14 – 2014-06-15 (×2): 200 mg via ORAL
  Filled 2014-06-14 (×2): qty 4

## 2014-06-14 MED ORDER — METOPROLOL TARTRATE 1 MG/ML IV SOLN
5.0000 mg | Freq: Four times a day (QID) | INTRAVENOUS | Status: DC
  Administered 2014-06-14 – 2014-06-15 (×4): 5 mg via INTRAVENOUS
  Filled 2014-06-14 (×4): qty 5

## 2014-06-14 MED ORDER — ROSUVASTATIN CALCIUM 20 MG PO TABS
20.0000 mg | ORAL_TABLET | Freq: Every day | ORAL | Status: DC
  Administered 2014-06-15 – 2014-06-16 (×2): 20 mg via ORAL
  Filled 2014-06-14 (×3): qty 1

## 2014-06-14 MED ORDER — ALLOPURINOL 100 MG PO TABS
100.0000 mg | ORAL_TABLET | Freq: Every day | ORAL | Status: DC
  Administered 2014-06-15 – 2014-06-16 (×2): 100 mg via ORAL
  Filled 2014-06-14 (×3): qty 1

## 2014-06-14 MED ORDER — ACETAMINOPHEN 325 MG PO TABS: 650.0000 mg | ORAL_TABLET | Freq: Four times a day (QID) | ORAL | Status: DC | PRN

## 2014-06-14 MED ORDER — ISOSORBIDE MONONITRATE ER 60 MG PO TB24
60.0000 mg | ORAL_TABLET | Freq: Every day | ORAL | Status: DC
  Administered 2014-06-15 – 2014-06-16 (×2): 60 mg via ORAL
  Filled 2014-06-14 (×3): qty 1

## 2014-06-14 MED ORDER — ONDANSETRON HCL 4 MG/2ML IJ SOLN
INTRAMUSCULAR | Status: AC
  Filled 2014-06-14: qty 2

## 2014-06-14 NOTE — Care Management (Signed)
Admitted for abd pain, n/v -  sx concerning for diverticulitis and found to have significantly elevated blood pressure due to patient's inability to take oral meds.  On IV antibiotics.  Has required IV hydralazine to control blood pressure.  Heart rate is sustained > 100

## 2014-06-14 NOTE — Progress Notes (Signed)
[place] [facility] [place] NAME: Casey French    MR#:  [id number]  DATE OF BIRTH:  January 16, 1951  SUBJECTIVE:  CHIEF COMPLAINT:  Abdominal pain  During my examination today patient is very nauseous and retching. Zofran is not helping much. Requesting for Phenergan. Daughter and granddaughter were at bedside.  REVIEW OF SYSTEMS:  CONSTITUTIONAL: No fever, fatigue or weakness.  EYES: No blurred or double vision.  EARS, NOSE, AND THROAT: No tinnitus or ear pain.  RESPIRATORY: No cough, shortness of breath, wheezing or hemoptysis.  CARDIOVASCULAR: No chest pain, orthopnea, edema.  GASTROINTESTINAL: Reporting nausea, vomiting, denies diarrhea , has abdominal pain.  GENITOURINARY: No dysuria, hematuria.  ENDOCRINE: No polyuria, nocturia,  HEMATOLOGY: No anemia, easy bruising or bleeding SKIN: No rash or lesion. MUSCULOSKELETAL: No joint pain or arthritis.   NEUROLOGIC: No tingling, numbness, weakness.  PSYCHIATRY: No anxiety or depression.   DRUG ALLERGIES:  No Known Allergies  VITALS:  Blood pressure 184/95, pulse 89, temperature 98.3 F (36.8 C), temperature source Oral, resp. rate 19, height 5\' 9"  (1.753 m), weight 78.109 kg (172 lb 3.2 oz), SpO2 94 %.  PHYSICAL EXAMINATION:  GENERAL:  64 y.o.-year-old patient lying in the bed with no acute distress.  EYES: Pupils equal, round, reactive to light and accommodation. No scleral icterus. Extraocular muscles intact.  HEENT: Head atraumatic, normocephalic. Oropharynx and nasopharynx clear.  NECK:  Supple, no jugular venous distention. No thyroid enlargement, no tenderness.  LUNGS: Normal breath sounds bilaterally, no wheezing, rales,rhonchi or crepitation. No use of accessory muscles of respiration.  CARDIOVASCULAR: S1, S2 normal. No murmurs, rubs, or gallops.  ABDOMEN: Soft, tender, nondistended. Bowel sounds present. No organomegaly or mass.  EXTREMITIES: No pedal edema, cyanosis, or  clubbing.  NEUROLOGIC: Cranial nerves II through XII are intact. Muscle strength 5/5 in all extremities. Sensation intact. Gait not checked.  PSYCHIATRIC: The patient is alert and oriented x 3.  SKIN: No obvious rash, lesion, or ulcer.    LABORATORY PANEL:   CBC  Recent Labs Lab 06/14/14 0532  WBC 12.6*  HGB 12.6*  HCT 37.5*  PLT 217   ------------------------------------------------------------------------------------------------------------------  Chemistries   Recent Labs Lab 06/13/14 1849 06/14/14 0532  NA 138  --   K 3.9  --   CL 101  --   CO2 26  --   GLUCOSE 131*  --   BUN 29*  --   CREATININE 1.40* 1.21  CALCIUM 9.6  --    ------------------------------------------------------------------------------------------------------------------  Cardiac Enzymes  Recent Labs Lab 06/14/14 0913  TROPONINI 0.05*   ------------------------------------------------------------------------------------------------------------------  RADIOLOGY:  Ct Abdomen Pelvis W Contrast  06/13/2014   CLINICAL DATA:  Abdominal pain, nausea, and vomiting, history diabetes, hypertension, MI, former smoker  EXAM: CT ABDOMEN AND PELVIS WITH CONTRAST  TECHNIQUE: Multidetector CT imaging of the abdomen and pelvis was performed using the standard protocol following bolus administration of intravenous contrast. Sagittal and coronal MPR images reconstructed from axial data set.  CONTRAST:  161mL OMNIPAQUE IOHEXOL 300 MG/ML SOLN IV. Dilute oral contrast.  COMPARISON:  09/26/2012  FINDINGS: Atelectasis at RIGHT lung base.  Scattered atherosclerotic calcifications aorta and coronary arteries.  Tiny RIGHT renal cyst.  Liver, gallbladder, spleen, pancreas, kidneys, and adrenal glands otherwise normal.  Normal appendix.  Normal appearing bladder and ureters.  Descending and sigmoid colonic diverticulosis with minimal wall thickening of the mid to distal sigmoid colon similar to previous exam.  No definite  pericolic inflammatory changes to suggest acute  diverticulitis.  Small amount of nonspecific dependent free pelvic fluid.  Stomach and small bowel loops unremarkable.  No mass, adenopathy, free air, or acute osseous findings.  Tiny umbilical hernia containing fat.  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  IMPRESSION: Distal colonic diverticulosis without definite evidence of acute diverticulitis.  RIGHT basilar atelectasis.  Tiny umbilical hernia containing fat.  No definite acute intra-abdominal or intrapelvic abnormalities.   Electronically Signed   By: Lavonia Dana M.D.   On: 06/13/2014 22:28    EKG:   Orders placed or performed during the hospital encounter of 06/13/14  . EKG 12-Lead  . EKG 12-Lead  . EKG 12-Lead  . EKG 12-Lead    ASSESSMENT AND PLAN:   1. Hypertensive urgency: Place on telemetry, Restarted home medications, patient is currently nauseous and unable to tolerate by mouth medications will provide IV Lopressor as needed basis for elevated blood pressure .add when necessary hydralazine,  No trending of  cardiac enzymes 2. Diverticulitis: no market abnormality on CAT scan other than diverticulosis, however given symptoms with leukocytosis he was initiated on coverage for diverticulosis in the emergency department-will continue antibiotic coverage 3. Acute kidney injury, prerenal: Provide IV fluid hydration,  check BMP in a.m. follow urine output/renal function, avoid further nephrotoxic agents will hold ACE inhibitor and diuretic 4. Type 2 diabetes, uncomplicated: Hold oral agents, place on insulin sliding scale with every 6 hours Accu-Cheks 5.Venous thromboembolism prophylactic: Heparin subcutaneous     All the records are reviewed and case discussed with Care Management/Social Workerr. Management plans discussed with the patient, family and they are in agreement.  CODE STATUS: Full  TOTAL TIME TAKING CARE OF THIS PATIENT:  35 minutes.   POSSIBLE D/C IN  2-3  DAYS, DEPENDING ON CLINICAL CONDITION.   Nicholes Mango M.D on 06/14/2014 at 3:00 PM  Between 7am to 6pm - Pager - [phone] After 6pm go to [email or web address] - password [email or web address]  Office  [phone]  CC: Primary care physician; [name], MD

## 2014-06-14 NOTE — ED Notes (Signed)
Patient medicated for elevatd blood pressure, nausea and pain. Tolerating well. Wife at bedside. Awaiting bed assignment.

## 2014-06-14 NOTE — H&P (Signed)
[facility] [place] NAME: Casey French    MR#:  [id number]  DATE OF BIRTH:  Jan 07, 1951   DATE OF ADMISSION:  06/13/2014  PRIMARY CARE PHYSICIAN: [name], MD   REQUESTING/REFERRING PHYSICIAN: Owens Shark  CHIEF COMPLAINT:   Chief Complaint  Patient presents with  . Abdominal Pain    HISTORY OF PRESENT ILLNESS:  Casey French  is a 64 y.o. male with a known history of type 2 diabetes non-insulin-requiring, gastroesophageal reflux disease, essential hypertension presenting with abdominal pain. Describes 2 day duration of persistent nausea, vomiting of nonbloody nonbilious emesis this was preceded by one day duration of diarrhea which has stopped. He has associated abdominal pain suprapubic to right lower quadrant in location, dull in quality 8 out of 10 in intensity, no worsening or relieving factors, with associated subjective fever/chills. He is had poor by mouth intake the last 2 day duration and states he has not taken any of his medications the same time.  PAST MEDICAL HISTORY:   Past Medical History  Diagnosis Date  . Myocardial infarction   . Hypertension   . Diabetes mellitus without complication   . High cholesterol   . Depression   . Gout     PAST SURGICAL HISTORY:   Past Surgical History  Procedure Laterality Date  . Cardiac catheterization    . Right hand surgery      3 screws in right hand    SOCIAL HISTORY:   History  Substance Use Topics  . Smoking status: Former Smoker    Quit date: 03/24/2001  . Smokeless tobacco: Never Used  . Alcohol Use: No    FAMILY HISTORY:   Family History  Problem Relation Age of Onset  . CAD Other     DRUG ALLERGIES:  No Known Allergies  REVIEW OF SYSTEMS:  REVIEW OF SYSTEMS:  CONSTITUTIONAL:  Positive forfevers, chills, fatigue, weakness.  EYES: Denies blurred vision, double vision, or eye pain.  EARS, NOSE, THROAT: Denies tinnitus, ear pain,  hearing loss.  RESPIRATORY: denies cough, shortness of breath, wheezing  CARDIOVASCULAR: Denies chest pain, palpitations, edema.  GASTROINTESTINAL:  Positive fornausea, vomiting, diarrhea, abdominal pain.  GENITOURINARY: Denies dysuria, hematuria.  ENDOCRINE: Denies nocturia or thyroid problems. HEMATOLOGIC AND LYMPHATIC: Denies easy bruising or bleeding.  SKIN: Denies rash or lesions.  MUSCULOSKELETAL: Denies pain in neck, back, shoulder, knees, hips, or further arthritic symptoms.  NEUROLOGIC: Denies paralysis, paresthesias.  PSYCHIATRIC: Denies anxiety or depressive symptoms. Otherwise full review of systems performed by me is negative.   MEDICATIONS AT HOME:   Prior to Admission medications   Medication Sig Start Date End Date Taking? Authorizing Provider  allopurinol (ZYLOPRIM) 100 MG tablet Take 100 mg by mouth daily. 03/14/13   Historical Provider, MD  aspirin 81 MG tablet Take 81 mg by mouth daily.    Historical Provider, MD  CRESTOR 20 MG tablet Take 20 mg by mouth daily. 03/14/13   Historical Provider, MD  hydrochlorothiazide (HYDRODIURIL) 25 MG tablet Take 37.5 mg by mouth daily.  03/14/13   Historical Provider, MD  isosorbide mononitrate (IMDUR) 60 MG 24 hr tablet Take 60 mg by mouth daily. 03/14/13   Historical Provider, MD  lisinopril (PRINIVIL,ZESTRIL) 2.5 MG tablet Take 2.5 mg by mouth daily. 03/13/13   Historical Provider, MD  metFORMIN (GLUCOPHAGE-XR) 500 MG 24 hr tablet Take 500 mg by mouth daily. 03/14/13   Historical Provider, MD  metoprolol succinate (TOPROL-XL) 100 MG 24 hr tablet  Take 100 mg by mouth daily. 03/14/13   Historical Provider, MD  Omega-3 Fatty Acids (FISH OIL) 1000 MG CAPS Take 1,000 mg by mouth daily.    Historical Provider, MD  omeprazole (PRILOSEC) 20 MG capsule Take 20 mg by mouth daily. 03/14/13   Historical Provider, MD  sertraline (ZOLOFT) 100 MG tablet Take 200 mg by mouth at bedtime.    Historical Provider, MD      VITAL SIGNS:  Blood pressure  204/111, pulse 75, temperature 98.3 F (36.8 C), temperature source Oral, resp. rate 22, height 5\' 9"  (1.753 m), weight 174 lb (78.926 kg), SpO2 95 %.  PHYSICAL EXAMINATION:  VITAL SIGNS: Filed Vitals:   06/13/14 2345  BP:   Pulse: 75  Temp:   Resp:    GENERAL:64 y.o.male currently in no acute distress.  HEAD: Normocephalic, atraumatic.  EYES: Pupils equal, round, reactive to light. Extraocular muscles intact. No scleral icterus.  MOUTH:  Drymucosal membrane. Dentition intact. No abscess noted.  EAR, NOSE, THROAT: Clear without exudates. No external lesions.  NECK: Supple. No thyromegaly. No nodules. No JVD.  PULMONARY: Clear to ascultation, without wheeze rails or rhonci. No use of accessory muscles, Good respiratory effort. good air entry bilaterally CHEST: Nontender to palpation.  CARDIOVASCULAR: S1 and S2. Regular rate and rhythm. No murmurs, rubs, or gallops. No edema. Pedal pulses 2+ bilaterally.  GASTROINTESTINAL: Soft,  minimal tenderness to palpation right lower quadrant without rebound or guarding, nondistended. No masses. Positive bowel sounds. No hepatosplenomegaly.  MUSCULOSKELETAL: No swelling, clubbing, or edema. Range of motion full in all extremities.  NEUROLOGIC: Cranial nerves II through XII are intact. No gross focal neurological deficits. Sensation intact. Reflexes intact.  SKIN: No ulceration, lesions, rashes, or cyanosis. Skin warm and dry. Turgor intact.  PSYCHIATRIC: Mood, affect within normal limits. The patient is awake, alert and oriented x 3. Insight, judgment intact.    LABORATORY PANEL:   CBC  Recent Labs Lab 06/13/14 1849  WBC 17.8*  HGB 14.2  HCT 41.0  PLT 285   ------------------------------------------------------------------------------------------------------------------  Chemistries   Recent Labs Lab 06/13/14 1849  NA 138  K 3.9  CL 101  CO2 26  GLUCOSE 131*  BUN 29*  CREATININE 1.40*  CALCIUM 9.6    ------------------------------------------------------------------------------------------------------------------  Cardiac Enzymes  Recent Labs Lab 06/13/14 1849  TROPONINI 0.04*   ------------------------------------------------------------------------------------------------------------------  RADIOLOGY:  Ct Abdomen Pelvis W Contrast  06/13/2014   CLINICAL DATA:  Abdominal pain, nausea, and vomiting, history diabetes, hypertension, MI, former smoker  EXAM: CT ABDOMEN AND PELVIS WITH CONTRAST  TECHNIQUE: Multidetector CT imaging of the abdomen and pelvis was performed using the standard protocol following bolus administration of intravenous contrast. Sagittal and coronal MPR images reconstructed from axial data set.  CONTRAST:  146mL OMNIPAQUE IOHEXOL 300 MG/ML SOLN IV. Dilute oral contrast.  COMPARISON:  09/26/2012  FINDINGS: Atelectasis at RIGHT lung base.  Scattered atherosclerotic calcifications aorta and coronary arteries.  Tiny RIGHT renal cyst.  Liver, gallbladder, spleen, pancreas, kidneys, and adrenal glands otherwise normal.  Normal appendix.  Normal appearing bladder and ureters.  Descending and sigmoid colonic diverticulosis with minimal wall thickening of the mid to distal sigmoid colon similar to previous exam.  No definite pericolic inflammatory changes to suggest acute diverticulitis.  Small amount of nonspecific dependent free pelvic fluid.  Stomach and small bowel loops unremarkable.  No mass, adenopathy, free air, or acute osseous findings.  Tiny umbilical hernia containing fat.  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  IMPRESSION: Distal colonic diverticulosis  without definite evidence of acute diverticulitis.  RIGHT basilar atelectasis.  Tiny umbilical hernia containing fat.  No definite acute intra-abdominal or intrapelvic abnormalities.   Electronically Signed   By: Lavonia Dana M.D.   On: 06/13/2014 22:28    EKG:   Orders placed or performed during the  hospital encounter of 06/13/14  . EKG 12-Lead  . EKG 12-Lead  . EKG 12-Lead  . EKG 12-Lead    IMPRESSION AND PLAN:   64 year old gentleman history of hypertension essential as well as diabetes type 2 uncomplicated presenting for abdominal pain. He has describes subjective fevers chills and found to have leukocytosis.  1. Hypertensive urgency: Place on telemetry, Restart home medications, add when necessary hydralazine, trend cardiac enzymes 2. Diverticulitis: no market abnormality on CAT scan other than diverticulosis, however given symptoms with leukocytosis he was initiated on coverage for diverticulosis in the emergency department-will continue antibiotic coverage 3. Acute kidney injury, prerenal: Provide IV fluid hydration, follow urine output/renal function, avoid further nephrotoxic agents will hold ACE inhibitor and diuretic 4. Type 2 diabetes, uncomplicated: Hold oral agents, place on insulin sliding scale with every 6 hours Accu-Cheks 5.Venous thromboembolism prophylactic: Heparin subcutaneous  All the records are reviewed and case discussed with ED provider. Management plans discussed with the patient, family and they are in agreement.  CODE STATUS:  Full  TOTAL TIME TAKING CARE OF THIS PATIENT:  45 minutes.    Hower,  Karenann Cai.D on 06/14/2014 at 1:33 AM  Between 7am to 6pm - Pager - [phone]  After 6pm: House Pager: - [facility]  Office  [phone]  CC: Primary care physician; [name], MD

## 2014-06-14 NOTE — Progress Notes (Signed)
Initial Nutrition Assessment  DOCUMENTATION CODES:     INTERVENTION:   (Meals and Snacks: Cater to patient preferences)  NUTRITION DIAGNOSIS:  Inadequate oral intake related to acute illness as evidenced by per patient/family report, other (see comment), percent weight loss (Clear Liquid diet order).    GOAL:  Patient will meet greater than or equal to 90% of their needs once diet advanced to solid foods.    MONITOR:   (Energy Intake, Electrolyte and Renal Profile, Diet Advancement, Digestive Profile)  REASON FOR ASSESSMENT:  Malnutrition Screening Tool    ASSESSMENT:  Reason For Admission: Hypertensive urgency PMHx: Past Medical History  Diagnosis Date  . Myocardial infarction   . Hypertension   . Diabetes mellitus without complication   . High cholesterol   . Depression   . Gout     Typical Fluid/ Food Intake: 0% recorded per I/O; pt states that he "doesn't feel like eating anything" this AM. Meal/ Snack Patterns: Pt reports eating a regular diet with no restrictions PTA. Prior to "feeling bad", patient reports a good appetite and intake.  Supplements: None  Labs:  Electrolyte and Renal Profile:    Recent Labs Lab 06/13/14 1849 06/14/14 0532  BUN 29*  --   CREATININE 1.40* 1.21  NA 138  --   K 3.9  --    Glucose Profile:  Recent Labs  06/14/14 0734  GLUCAP 118*    Meds: Flagyl, Crestor, NS @ 100 ml/ hr  UOP:   Intake/Output Summary (Last 24 hours) at 06/14/14 1051 Last data filed at 06/14/14 0830  Gross per 24 hour  Intake 221.67 ml  Output    400 ml  Net -178.33 ml     Physical Findings:  Weight Changes: Patient reports weighing about 186# at MD office x 2-3 months ago. Current weight represents a 7.5% weight loss x 2-3 months- which is significant. Reviewed hospital records for Sept 2014- weight was 177.2; weight in Feb 2015 was 203. Some weight fluctuations noted x 2 years.  Height:  Ht Readings from Last 1 Encounters:   06/13/14 5\' 9"  (1.753 m)    Weight:  Wt Readings from Last 1 Encounters:  06/14/14 172 lb 3.2 oz (78.109 kg)    Ideal Body Weight:     Wt Readings from Last 10 Encounters:  06/14/14 172 lb 3.2 oz (78.109 kg)  03/24/13 203 lb 7.8 oz (92.3 kg)    BMI:  Body mass index is 25.42 kg/(m^2).  Skin:  Reviewed, no issues  Diet Order:  Diet clear liquid Room service appropriate?: Yes; Fluid consistency:: Thin  EDUCATION NEEDS:  No education needs identified at this time   Intake/Output Summary (Last 24 hours) at 06/14/14 1049 Last data filed at 06/14/14 0830  Gross per 24 hour  Intake 221.67 ml  Output    400 ml  Net -178.33 ml    Last BM:  None noted  Roda Shutters, RDN Pager: [phone] Office: [place]

## 2014-06-14 NOTE — Progress Notes (Signed)
   06/14/14 1700  Clinical Encounter Type  Visited With Patient  Visit Type Other (Comment) (Nurse was unsure of the nature of the visit.)  Referral From Nurse  Consult/Referral To Chaplain  Spiritual Encounters  Spiritual Needs Other (Comment) (Patient did not want to speak with Chaplain)  Stress Factors  Patient Stress Factors None identified  Family Stress Factors None identified  Advance Directives (For Healthcare)  Does patient have an advance directive? No   Chaplain visited patient. Patient said he did not want to see a chaplain. Chaplain offered to come back if patient wanted at any time.  AD [phone]

## 2014-06-14 NOTE — ED Provider Notes (Signed)
-----------------------------------------   1:05 AM on 06/14/2014 ------------------------------------   Patient reexamined: Left lower quadrant/periumbilical pain noted. CT scan revealed diverticulosis of the descending and sigmoid colon given elevated WBCs and pain at that location suspect diverticulitis. As such ciprofloxacin 500 mg and Flagyl 500 mg by mouth given. Patient also has an elevated troponin of 0.04, of note patient has a history of myocardial infarction and has been unable to tolerate taking his medications for the past 2 days. As such we'll admit the patient to the hospital for continued cardiac evaluation serial cardiac enzymes. Patient discussed with Dr. Lynn Ito, MD 06/14/14 [phone]

## 2014-06-15 LAB — GLUCOSE, CAPILLARY
GLUCOSE-CAPILLARY: 99 mg/dL (ref 65–99)
GLUCOSE-CAPILLARY: 104 mg/dL — AB (ref 65–99)
GLUCOSE-CAPILLARY: 132 mg/dL — AB (ref 65–99)
Glucose-Capillary: 106 mg/dL — ABNORMAL HIGH (ref 65–99)

## 2014-06-15 MED ORDER — METOPROLOL TARTRATE 1 MG/ML IV SOLN
10.0000 mg | Freq: Four times a day (QID) | INTRAVENOUS | Status: DC
  Administered 2014-06-15: 10 mg via INTRAVENOUS
  Filled 2014-06-15: qty 10

## 2014-06-15 MED ORDER — LORAZEPAM 2 MG/ML IJ SOLN
1.0000 mg | Freq: Once | INTRAMUSCULAR | Status: AC
  Administered 2014-06-15: 1 mg via INTRAVENOUS
  Filled 2014-06-15: qty 1

## 2014-06-15 MED ORDER — AMLODIPINE BESYLATE 10 MG PO TABS
10.0000 mg | ORAL_TABLET | Freq: Every day | ORAL | Status: DC
  Administered 2014-06-15 – 2014-06-16 (×2): 10 mg via ORAL
  Filled 2014-06-15 (×2): qty 1

## 2014-06-15 NOTE — Progress Notes (Signed)
Patient is alert and oriented. Has slept off and on today. Sinus rhythm on tele. No complaints of nausea or vomiting until late this afternoon. Per patient's wife, patient had one episode of a small amount of emesis. Nausea relieved with phenergan. IV antibiotics continued. Blood pressure is stable. Amlodipine PO added today to existing meds. Patient has had no complaints other than the one episode of nausea. Will continue to monitor.

## 2014-06-15 NOTE — Progress Notes (Signed)
[facility] at [place] NAME: Casey French    MR#:  [id number]  DATE OF BIRTH:  Jun 08, 1950  SUBJECTIVE:  CHIEF COMPLAINT:  Abdominal pain  Today patient is feeling much better. Denies nausea or vomiting. Abdominal pain is significantly improved. Tolerating clear liquid diet.  REVIEW OF SYSTEMS:  CONSTITUTIONAL: No fever, fatigue or weakness.  EYES: No blurred or double vision.  EARS, NOSE, AND THROAT: No tinnitus or ear pain.  RESPIRATORY: No cough, shortness of breath, wheezing or hemoptysis.  CARDIOVASCULAR: No chest pain, orthopnea, edema.  GASTROINTESTINAL: Denies nausea, vomiting, denies diarrhea , minimal abdominal pain.  GENITOURINARY: No dysuria, hematuria.  ENDOCRINE: No polyuria, nocturia,  HEMATOLOGY: No anemia, easy bruising or bleeding SKIN: No rash or lesion. MUSCULOSKELETAL: No joint pain or arthritis.   NEUROLOGIC: No tingling, numbness, weakness.  PSYCHIATRY: No anxiety or depression.   DRUG ALLERGIES:  No Known Allergies  VITALS:  Blood pressure 151/84, pulse 78, temperature 98.5 F (36.9 C), temperature source Oral, resp. rate 22, height 5\' 9"  (1.753 m), weight 78.109 kg (172 lb 3.2 oz), SpO2 95 %.  PHYSICAL EXAMINATION:  GENERAL:  64 y.o.-year-old patient lying in the bed with no acute distress.  EYES: Pupils equal, round, reactive to light and accommodation. No scleral icterus. Extraocular muscles intact.  HEENT: Head atraumatic, normocephalic. Oropharynx and nasopharynx clear.  NECK:  Supple, no jugular venous distention. No thyroid enlargement, no tenderness.  LUNGS: Normal breath sounds bilaterally, no wheezing, rales,rhonchi or crepitation. No use of accessory muscles of respiration.  CARDIOVASCULAR: S1, S2 normal. No murmurs, rubs, or gallops.  ABDOMEN: Soft, tender, nondistended. Bowel sounds present. No organomegaly or mass.  EXTREMITIES: No pedal edema, cyanosis, or clubbing.  NEUROLOGIC:  Cranial nerves II through XII are intact. Muscle strength 5/5 in all extremities. Sensation intact. Gait not checked.  PSYCHIATRIC: The patient is alert and oriented x 3.  SKIN: No obvious rash, lesion, or ulcer.    LABORATORY PANEL:   CBC  Recent Labs Lab 06/14/14 0532  WBC 12.6*  HGB 12.6*  HCT 37.5*  PLT 217   ------------------------------------------------------------------------------------------------------------------  Chemistries   Recent Labs Lab 06/13/14 1849 06/14/14 0532  NA 138  --   K 3.9  --   CL 101  --   CO2 26  --   GLUCOSE 131*  --   BUN 29*  --   CREATININE 1.40* 1.21  CALCIUM 9.6  --    ------------------------------------------------------------------------------------------------------------------  Cardiac Enzymes  Recent Labs Lab 06/14/14 1456  TROPONINI 0.05*   ------------------------------------------------------------------------------------------------------------------  RADIOLOGY:  Ct Abdomen Pelvis W Contrast  06/13/2014   CLINICAL DATA:  Abdominal pain, nausea, and vomiting, history diabetes, hypertension, MI, former smoker  EXAM: CT ABDOMEN AND PELVIS WITH CONTRAST  TECHNIQUE: Multidetector CT imaging of the abdomen and pelvis was performed using the standard protocol following bolus administration of intravenous contrast. Sagittal and coronal MPR images reconstructed from axial data set.  CONTRAST:  143mL OMNIPAQUE IOHEXOL 300 MG/ML SOLN IV. Dilute oral contrast.  COMPARISON:  09/26/2012  FINDINGS: Atelectasis at RIGHT lung base.  Scattered atherosclerotic calcifications aorta and coronary arteries.  Tiny RIGHT renal cyst.  Liver, gallbladder, spleen, pancreas, kidneys, and adrenal glands otherwise normal.  Normal appendix.  Normal appearing bladder and ureters.  Descending and sigmoid colonic diverticulosis with minimal wall thickening of the mid to distal sigmoid colon similar to previous exam.  No definite pericolic inflammatory  changes to suggest acute diverticulitis.  Small amount of  nonspecific dependent free pelvic fluid.  Stomach and small bowel loops unremarkable.  No mass, adenopathy, free air, or acute osseous findings.  Tiny umbilical hernia containing fat.  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  IMPRESSION: Distal colonic diverticulosis without definite evidence of acute diverticulitis.  RIGHT basilar atelectasis.  Tiny umbilical hernia containing fat.  No definite acute intra-abdominal or intrapelvic abnormalities.   Electronically Signed   By: Lavonia Dana M.D.   On: 06/13/2014 22:28    EKG:   Orders placed or performed during the hospital encounter of 06/13/14  . EKG 12-Lead  . EKG 12-Lead  . EKG 12-Lead  . EKG 12-Lead    ASSESSMENT AND PLAN:   1. Hypertensive urgency: Place on telemetry, Restarted home medications, continue metoprolol 100 mg by mouth once daily. Amlodipine 10 milligrams is added to the regimen  .add when necessary hydralazine,  No trending of  cardiac enzymes. Titrate medications as needed basis 2. Diverticulitis: no market abnormality on CAT scan other than diverticulosis, however given symptoms with leukocytosis he was initiated on coverage for diverticulosis in the emergency department-will continue antibiotic coverage will advance diet as tolerated. 3. Acute kidney injury, prerenal: Provide IV fluid hydration,  check BMP in a.m. follow urine output/renal function, avoid further nephrotoxic agents will hold ACE inhibitor and diuretic 4. Type 2 diabetes, uncomplicated: Hold oral agents, place on insulin sliding scale with every 6 hours Accu-Cheks 5.Venous thromboembolism prophylactic: Heparin subcutaneous     All the records are reviewed and case discussed with Care Management/Social Workerr. Management plans discussed with the patient, family and they are in agreement.  CODE STATUS: Full  TOTAL TIME TAKING CARE OF THIS PATIENT:  35 minutes.   POSSIBLE D/C IN  1-2   DAYS, DEPENDING ON CLINICAL CONDITION.   Nicholes Mango M.D on 06/15/2014 at 3:22 PM  Between 7am to 6pm - Pager - [phone] After 6pm go to [email or web address] - password [email or web address] [facility]  [facility]  Office  [phone]  CC: Primary care physician; [name], MD

## 2014-06-15 NOTE — Progress Notes (Signed)
Notified Dr. Marcille Blanco patient complaining of continued pain in legs but mostly agitation, can't sit still. Ativan ordered and given.

## 2014-06-16 LAB — CBC
HCT: 32 % — ABNORMAL LOW (ref 40.0–52.0)
HEMOGLOBIN: 10.9 g/dL — AB (ref 13.0–18.0)
MCH: 30.6 pg (ref 26.0–34.0)
MCHC: 33.9 g/dL (ref 32.0–36.0)
MCV: 90.4 fL (ref 80.0–100.0)
Platelets: 179 10*3/uL (ref 150–440)
RBC: 3.54 MIL/uL — AB (ref 4.40–5.90)
RDW: 13.9 % (ref 11.5–14.5)
WBC: 10.2 10*3/uL (ref 3.8–10.6)

## 2014-06-16 LAB — BASIC METABOLIC PANEL
ANION GAP: 7 (ref 5–15)
BUN: 26 mg/dL — AB (ref 6–20)
CO2: 25 mmol/L (ref 22–32)
CREATININE: 1.16 mg/dL (ref 0.61–1.24)
Calcium: 8.2 mg/dL — ABNORMAL LOW (ref 8.9–10.3)
Chloride: 104 mmol/L (ref 101–111)
GFR calc Af Amer: >60 mL/min (ref >60)
GLUCOSE: 95 mg/dL (ref 65–99)
POTASSIUM: 3 mmol/L — AB (ref 3.5–5.1)
SODIUM: 136 mmol/L (ref 135–145)

## 2014-06-16 LAB — GLUCOSE, CAPILLARY
GLUCOSE-CAPILLARY: 92 mg/dL (ref 65–99)
Glucose-Capillary: 107 mg/dL — ABNORMAL HIGH (ref 65–99)

## 2014-06-16 MED ORDER — CIPROFLOXACIN HCL 250 MG PO TABS: 250.0000 mg | ORAL_TABLET | Freq: Two times a day (BID) | ORAL | Status: AC

## 2014-06-16 MED ORDER — ACETAMINOPHEN 325 MG PO TABS: 650.0000 mg | ORAL_TABLET | Freq: Four times a day (QID) | ORAL | Status: DC | PRN

## 2014-06-16 MED ORDER — POTASSIUM CHLORIDE CRYS ER 20 MEQ PO TBCR
40.0000 meq | EXTENDED_RELEASE_TABLET | Freq: Two times a day (BID) | ORAL | Status: DC
  Administered 2014-06-16: 40 meq via ORAL
  Filled 2014-06-16 (×2): qty 2

## 2014-06-16 MED ORDER — AMLODIPINE BESYLATE 10 MG PO TABS: 10.0000 mg | ORAL_TABLET | Freq: Every day | ORAL | Status: DC

## 2014-06-16 MED ORDER — METRONIDAZOLE 500 MG PO TABS: 500.0000 mg | ORAL_TABLET | Freq: Three times a day (TID) | ORAL | Status: AC

## 2014-06-16 MED ORDER — LISINOPRIL 5 MG PO TABS
5.0000 mg | ORAL_TABLET | Freq: Every day | ORAL | Status: DC
Start: 2014-06-16 — End: 2019-05-15

## 2014-06-16 NOTE — Discharge Summary (Signed)
[facility] NAME: Casey French    MR#:  [id number]  DATE OF BIRTH:  1950/07/05  DATE OF ADMISSION:  06/13/2014 ADMITTING PHYSICIAN: [name], MD  DATE OF DISCHARGE: 06/16/2014 PRIMARY CARE PHYSICIAN: [name], MD    ADMISSION DIAGNOSIS:  abd pain  DISCHARGE DIAGNOSIS:  Principal Problem:   Hypertensive urgency Active Problems:   Diverticulitis   AKI (acute kidney injury)   DM type 2 (diabetes mellitus, type 2)   SECONDARY DIAGNOSIS:   Past Medical History  Diagnosis Date  . Myocardial infarction   . Hypertension   . Diabetes mellitus without complication   . High cholesterol   . Depression   . Gout     HOSPITAL COURSE:   Brief history and physical-patient came into the ED with a chief complaint of abdominal pain. Reporting two-day history of nausea or vomiting prior to admission and then subsequently developed abdominal pain in the suprapubic area radiating to the right lower quadrant. Please review history and physical for details. Patient had poor by mouth intake for 2 days prior to the admission in view of nausea and vomiting and was unable to take any of his medications. Next  Hospital course  #. Hypertensive urgency: Place on telemetry, Restarted home medications, continued metoprolol 100 mg by mouth once daily. Amlodipine 10 milligrams is added to the regimen .initially given IV hydralazine as needed basis as patient was nauseous and vomiting which is discontinued now.  Acute kidney injury is resolved. Patient is tolerating food. Increase lisinopril from 2.5-5 mg by mouth once daily for better blood pressure control. PCP to titrate as needed basis. No trending of cardiac enzymes.   #. Diverticulitis: no market abnormality on CAT scan other than diverticulosis, however given symptoms with leukocytosis he was initiated on coverage for diverticulosis in the emergency department-continued  antibiotic coverage, patient's clinical situation significantly improved. Patient is tolerating advanced diet. Feeling better today with no nausea vomiting or abdominal pain. Feels comfortable to be discharged home  #. Acute kidney injury, prerenal: Renal function improved with IV fluids, avoid new nephrotoxic agents   #. Type 2 diabetes, uncomplicated: Held oral agents during hospital course because of the renal insufficiency, resume metformin as renal function is back to normal   #.Venous thromboembolism prophylactic: Provided with Heparin subcutaneous during hospital course        DISCHARGE CONDITIONS:  Satisfactory  CONSULTS OBTAINED:  Treatment Team:  [name], MD Nicholes Mango, MD   PROCEDURES none  DRUG ALLERGIES:  No Known Allergies  DISCHARGE MEDICATIONS:   Current Discharge Medication List    START taking these medications   Details  acetaminophen (TYLENOL) 325 MG tablet Take 2 tablets (650 mg total) by mouth every 6 (six) hours as needed for mild pain (or Fever >/= 101).    amLODipine (NORVASC) 10 MG tablet Take 1 tablet (10 mg total) by mouth daily. Qty: 30 tablet, Refills: 0    ciprofloxacin (CIPRO) 250 MG tablet Take 1 tablet (250 mg total) by mouth 2 (two) times daily. Qty: 10 tablet, Refills: 0    metroNIDAZOLE (FLAGYL) 500 MG tablet Take 1 tablet (500 mg total) by mouth 3 (three) times daily. Qty: 15 tablet, Refills: 0      CONTINUE these medications which have CHANGED   Details  lisinopril (ZESTRIL) 5 MG tablet Take 1 tablet (5 mg total) by mouth daily. Qty: 30 tablet, Refills: 0  CONTINUE these medications which have NOT CHANGED   Details  allopurinol (ZYLOPRIM) 100 MG tablet Take 100 mg by mouth daily.    aspirin 81 MG tablet Take 81 mg by mouth daily.    CRESTOR 20 MG tablet Take 20 mg by mouth daily.    hydrochlorothiazide (HYDRODIURIL) 25 MG tablet Take 37.5 mg by mouth daily.     isosorbide mononitrate (IMDUR) 60 MG 24 hr  tablet Take 60 mg by mouth daily.    metFORMIN (GLUCOPHAGE-XR) 500 MG 24 hr tablet Take 500 mg by mouth daily.    metoprolol succinate (TOPROL-XL) 100 MG 24 hr tablet Take 100 mg by mouth daily.    Omega-3 Fatty Acids (FISH OIL) 1000 MG CAPS Take 1,000 mg by mouth daily.    omeprazole (PRILOSEC) 20 MG capsule Take 20 mg by mouth daily.    sertraline (ZOLOFT) 100 MG tablet Take 200 mg by mouth at bedtime.         DISCHARGE INSTRUCTIONS:    follow-up with primary care physician in a week  Follow up with outpatient diabetic clinic-lifestyle in a week  DIET:  Healthy heart, ADA   DISCHARGE CONDITION:  {Satisfactory  ACTIVITY:  As tolerated   OXYGEN:  Home Oxygen: no   Oxygen Delivery: no need   DISCHARGE LOCATION:  Home     If you experience worsening of your admission symptoms, develop shortness of breath, life threatening emergency, suicidal or homicidal thoughts you must seek medical attention immediately by calling 911 or calling your MD immediately  if symptoms less severe.  You Must read complete instructions/literature along with all the possible adverse reactions/side effects for all the Medicines you take and that have been prescribed to you. Take any new Medicines after you have completely understood and accpet all the possible adverse reactions/side effects.   Please note  You were cared for by a hospitalist during your hospital stay. If you have any questions about your discharge medications or the care you received while you were in the hospital after you are discharged, you can call the unit and asked to speak with the hospitalist on call if the hospitalist that took care of you is not available. Once you are discharged, your primary care physician will handle any further medical issues. Please note that NO REFILLS for any discharge medications will be authorized once you are discharged, as it is imperative that you return to your primary care physician (or  establish a relationship with a primary care physician if you do not have one) for your aftercare needs so that they can reassess your need for medications and monitor your lab values.     Today  Chief Complaint  Patient presents with  . Abdominal Pain    patient is feeling better. Denies any abdominal pain. Nausea vomiting is resolved. Tolerating food by mouth. Feels comfortable to be discharged   ROS:  CONSTITUTIONAL: Denies fevers, chills. Denies any fatigue, weakness.  EYES: Denies blurry vision, double vision, eye pain. EARS, NOSE, THROAT: Denies tinnitus, ear pain, hearing loss. RESPIRATORY: Denies cough, wheeze, shortness of breath.  CARDIOVASCULAR: Denies chest pain, palpitations, edema.  GASTROINTESTINAL: Denies nausea, vomiting, diarrhea, abdominal pain. Denies bright red blood per rectum. GENITOURINARY: Denies dysuria, hematuria. ENDOCRINE: Denies nocturia or thyroid problems. HEMATOLOGIC AND LYMPHATIC: Denies easy bruising or bleeding. SKIN: Denies rash or lesion. MUSCULOSKELETAL: Denies pain in neck, back, shoulder, knees, hips or arthritic symptoms.  NEUROLOGIC: Denies paralysis, paresthesias.  PSYCHIATRIC: Denies anxiety or depressive symptoms.  VITAL SIGNS:  Blood pressure 169/94, pulse 77, temperature 98.4 F (36.9 C), temperature source Oral, resp. rate 18, height 5\' 9"  (1.753 m), weight 78.654 kg (173 lb 6.4 oz), SpO2 93 %.  I/O:   Intake/Output Summary (Last 24 hours) at 06/16/14 1135 Last data filed at 06/16/14 1131  Gross per 24 hour  Intake   1500 ml  Output    775 ml  Net    725 ml    PHYSICAL EXAMINATION:  GENERAL:  63 y.o.-year-old patient lying in the bed with no acute distress.  EYES: Pupils equal, round, reactive to light and accommodation. No scleral icterus. Extraocular muscles intact.  HEENT: Head atraumatic, normocephalic. Oropharynx and nasopharynx clear.  NECK:  Supple, no jugular venous distention. No thyroid enlargement, no  tenderness.  LUNGS: Normal breath sounds bilaterally, no wheezing, rales,rhonchi or crepitation. No use of accessory muscles of respiration.  CARDIOVASCULAR: S1, S2 normal. No murmurs, rubs, or gallops.  ABDOMEN: Soft, non-tender, non-distended. Bowel sounds present. No organomegaly or mass.  EXTREMITIES: No pedal edema, cyanosis, or clubbing.  NEUROLOGIC: Cranial nerves II through XII are intact. Muscle strength 5/5 in all extremities. Sensation intact. Gait not checked.  PSYCHIATRIC: The patient is alert and oriented x 3.  SKIN: No obvious rash, lesion, or ulcer.   DATA REVIEW:   CBC  Recent Labs Lab 06/16/14 0409  WBC 10.2  HGB 10.9*  HCT 32.0*  PLT 179    Chemistries   Recent Labs Lab 06/16/14 0409  NA 136  K 3.0*  CL 104  CO2 25  GLUCOSE 95  BUN 26*  CREATININE 1.16  CALCIUM 8.2*    Cardiac Enzymes  Recent Labs Lab 06/14/14 1456  TROPONINI 0.05*    Microbiology Results  Results for orders placed or performed during the hospital encounter of 03/24/13  MRSA PCR Screening     Status: None   Collection Time: 03/24/13  2:48 AM  Result Value Ref Range Status   MRSA by PCR NEGATIVE NEGATIVE Final    Comment:        The GeneXpert MRSA Assay (FDA approved for NASAL specimens only), is one component of a comprehensive MRSA colonization surveillance program. It is not intended to diagnose MRSA infection nor to guide or monitor treatment for MRSA infections.    RADIOLOGY:  Ct Abdomen Pelvis W Contrast  06/13/2014   CLINICAL DATA:  Abdominal pain, nausea, and vomiting, history diabetes, hypertension, MI, former smoker  EXAM: CT ABDOMEN AND PELVIS WITH CONTRAST  TECHNIQUE: Multidetector CT imaging of the abdomen and pelvis was performed using the standard protocol following bolus administration of intravenous contrast. Sagittal and coronal MPR images reconstructed from axial data set.  CONTRAST:  112mL OMNIPAQUE IOHEXOL 300 MG/ML SOLN IV. Dilute oral contrast.   COMPARISON:  09/26/2012  FINDINGS: Atelectasis at RIGHT lung base.  Scattered atherosclerotic calcifications aorta and coronary arteries.  Tiny RIGHT renal cyst.  Liver, gallbladder, spleen, pancreas, kidneys, and adrenal glands otherwise normal.  Normal appendix.  Normal appearing bladder and ureters.  Descending and sigmoid colonic diverticulosis with minimal wall thickening of the mid to distal sigmoid colon similar to previous exam.  No definite pericolic inflammatory changes to suggest acute diverticulitis.  Small amount of nonspecific dependent free pelvic fluid.  Stomach and small bowel loops unremarkable.  No mass, adenopathy, free air, or acute osseous findings.  Tiny umbilical hernia containing fat.  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  IMPRESSION: Distal colonic diverticulosis without definite evidence of acute diverticulitis.  RIGHT basilar atelectasis.  Tiny umbilical hernia containing fat.  No definite acute intra-abdominal or intrapelvic abnormalities.   Electronically Signed   By: Lavonia Dana M.D.   On: 06/13/2014 22:28    EKG:   Orders placed or performed during the hospital encounter of 06/13/14  . EKG 12-Lead  . EKG 12-Lead  . EKG 12-Lead  . EKG 12-Lead      Management plans discussed with the patient, family and they are in agreement.  CODE STATUS:     Code Status Orders        Start     Ordered   06/14/14 0111  Full code   Continuous     06/14/14 0112      TOTAL TIME TAKING CARE OF THIS PATIENT: 45 minutes.    @[facility] @  on 06/16/2014 at 11:35 AM  Between 7am to 6pm - Pager - [phone]  After 6pm go to [email or web address] - password [email or web address] [facility]  [facility]  Office  [phone]  CC: Primary care physician; [name], MD

## 2014-06-16 NOTE — Progress Notes (Signed)
Pt slept well throughout night. Ambulated to BRx1 to void 375/urinal. Pt denied pain. No c/o nausea this shift. Lab results reported to Dr. Marcille Blanco. Potassium ordered per Dr. Marcille Blanco. Harlin Rain Pattijo Juste

## 2015-01-26 DIAGNOSIS — I219 Acute myocardial infarction, unspecified: Secondary | ICD-10-CM

## 2015-01-26 HISTORY — DX: Acute myocardial infarction, unspecified: I21.9

## 2015-04-30 ENCOUNTER — Observation Stay
Admission: EM | Admit: 2015-04-30 | Discharge: 2015-05-01 | Disposition: A | Discharge status: Home / Self Care | Payer: Medicare Other | Attending: Internal Medicine | Admitting: Internal Medicine

## 2015-04-30 ENCOUNTER — Emergency Department: Payer: Medicare Other

## 2015-04-30 ENCOUNTER — Encounter: Payer: Self-pay | Admitting: Emergency Medicine

## 2015-04-30 DIAGNOSIS — I2089 Other forms of angina pectoris: Secondary | ICD-10-CM | POA: Diagnosis present

## 2015-04-30 DIAGNOSIS — E119 Type 2 diabetes mellitus without complications: Secondary | ICD-10-CM | POA: Diagnosis not present

## 2015-04-30 DIAGNOSIS — I208 Other forms of angina pectoris: Secondary | ICD-10-CM | POA: Diagnosis present

## 2015-04-30 DIAGNOSIS — E876 Hypokalemia: Secondary | ICD-10-CM | POA: Insufficient documentation

## 2015-04-30 DIAGNOSIS — N179 Acute kidney failure, unspecified: Secondary | ICD-10-CM | POA: Diagnosis present

## 2015-04-30 DIAGNOSIS — Z7982 Long term (current) use of aspirin: Secondary | ICD-10-CM | POA: Insufficient documentation

## 2015-04-30 DIAGNOSIS — I4581 Long QT syndrome: Secondary | ICD-10-CM | POA: Diagnosis not present

## 2015-04-30 DIAGNOSIS — I252 Old myocardial infarction: Secondary | ICD-10-CM | POA: Diagnosis not present

## 2015-04-30 DIAGNOSIS — Z8249 Family history of ischemic heart disease and other diseases of the circulatory system: Secondary | ICD-10-CM | POA: Diagnosis not present

## 2015-04-30 DIAGNOSIS — I951 Orthostatic hypotension: Secondary | ICD-10-CM | POA: Diagnosis present

## 2015-04-30 DIAGNOSIS — M109 Gout, unspecified: Secondary | ICD-10-CM | POA: Diagnosis not present

## 2015-04-30 DIAGNOSIS — R9431 Abnormal electrocardiogram [ECG] [EKG]: Secondary | ICD-10-CM | POA: Insufficient documentation

## 2015-04-30 DIAGNOSIS — R55 Syncope and collapse: Secondary | ICD-10-CM | POA: Diagnosis present

## 2015-04-30 DIAGNOSIS — R0602 Shortness of breath: Secondary | ICD-10-CM | POA: Diagnosis not present

## 2015-04-30 DIAGNOSIS — K219 Gastro-esophageal reflux disease without esophagitis: Secondary | ICD-10-CM | POA: Diagnosis present

## 2015-04-30 DIAGNOSIS — R109 Unspecified abdominal pain: Secondary | ICD-10-CM | POA: Diagnosis not present

## 2015-04-30 DIAGNOSIS — R0789 Other chest pain: Secondary | ICD-10-CM | POA: Diagnosis not present

## 2015-04-30 DIAGNOSIS — Z885 Allergy status to narcotic agent status: Secondary | ICD-10-CM | POA: Diagnosis not present

## 2015-04-30 DIAGNOSIS — K5792 Diverticulitis of intestine, part unspecified, without perforation or abscess without bleeding: Secondary | ICD-10-CM | POA: Diagnosis not present

## 2015-04-30 DIAGNOSIS — I25119 Atherosclerotic heart disease of native coronary artery with unspecified angina pectoris: Secondary | ICD-10-CM | POA: Insufficient documentation

## 2015-04-30 DIAGNOSIS — F329 Major depressive disorder, single episode, unspecified: Secondary | ICD-10-CM | POA: Diagnosis not present

## 2015-04-30 DIAGNOSIS — I10 Essential (primary) hypertension: Secondary | ICD-10-CM | POA: Diagnosis present

## 2015-04-30 DIAGNOSIS — Z794 Long term (current) use of insulin: Secondary | ICD-10-CM | POA: Insufficient documentation

## 2015-04-30 DIAGNOSIS — Z87891 Personal history of nicotine dependence: Secondary | ICD-10-CM | POA: Diagnosis not present

## 2015-04-30 DIAGNOSIS — I2510 Atherosclerotic heart disease of native coronary artery without angina pectoris: Secondary | ICD-10-CM | POA: Diagnosis not present

## 2015-04-30 DIAGNOSIS — E7800 Pure hypercholesterolemia, unspecified: Secondary | ICD-10-CM | POA: Insufficient documentation

## 2015-04-30 DIAGNOSIS — R42 Dizziness and giddiness: Secondary | ICD-10-CM | POA: Insufficient documentation

## 2015-04-30 DIAGNOSIS — E860 Dehydration: Secondary | ICD-10-CM | POA: Diagnosis present

## 2015-04-30 LAB — COMPREHENSIVE METABOLIC PANEL
ALK PHOS: 47 U/L (ref 38–126)
ALT: 13 U/L — ABNORMAL LOW (ref 17–63)
ANION GAP: 10 (ref 5–15)
AST: 22 U/L (ref 15–41)
Albumin: 2.4 g/dL — ABNORMAL LOW (ref 3.5–5.0)
BILIRUBIN TOTAL: 0.5 mg/dL (ref 0.3–1.2)
BUN: 43 mg/dL — ABNORMAL HIGH (ref 6–20)
CALCIUM: 7.8 mg/dL — AB (ref 8.9–10.3)
CO2: 22 mmol/L (ref 22–32)
CREATININE: 2.05 mg/dL — AB (ref 0.61–1.24)
Chloride: 102 mmol/L (ref 101–111)
GFR, EST AFRICAN AMERICAN: 38 mL/min — AB (ref >60)
GFR, EST NON AFRICAN AMERICAN: 33 mL/min — AB (ref >60)
Glucose, Bld: 100 mg/dL — ABNORMAL HIGH (ref 65–99)
Potassium: 2.7 mmol/L — CL (ref 3.5–5.1)
SODIUM: 134 mmol/L — AB (ref 135–145)
TOTAL PROTEIN: 5.4 g/dL — AB (ref 6.5–8.1)

## 2015-04-30 LAB — CBC WITH DIFFERENTIAL/PLATELET
BASOS PCT: 1 %
Basophils Absolute: 0.1 10*3/uL (ref 0–0.1)
Eosinophils Absolute: 0 10*3/uL (ref 0–0.7)
Eosinophils Relative: 0 %
HEMATOCRIT: 31.7 % — AB (ref 40.0–52.0)
Hemoglobin: 11.1 g/dL — ABNORMAL LOW (ref 13.0–18.0)
LYMPHS ABS: 1.8 10*3/uL (ref 1.0–3.6)
Lymphocytes Relative: 14 %
MCH: 31.3 pg (ref 26.0–34.0)
MCHC: 34.9 g/dL (ref 32.0–36.0)
MCV: 89.7 fL (ref 80.0–100.0)
MONO ABS: 1.3 10*3/uL — AB (ref 0.2–1.0)
MONOS PCT: 10 %
NEUTROS ABS: 9.9 10*3/uL — AB (ref 1.4–6.5)
Neutrophils Relative %: 75 %
Platelets: 233 10*3/uL (ref 150–440)
RBC: 3.54 MIL/uL — ABNORMAL LOW (ref 4.40–5.90)
RDW: 13.3 % (ref 11.5–14.5)
WBC: 13.1 10*3/uL — ABNORMAL HIGH (ref 3.8–10.6)

## 2015-04-30 LAB — BASIC METABOLIC PANEL
ANION GAP: 7 (ref 5–15)
BUN: 43 mg/dL — ABNORMAL HIGH (ref 6–20)
CALCIUM: 7.6 mg/dL — AB (ref 8.9–10.3)
CO2: 21 mmol/L — ABNORMAL LOW (ref 22–32)
CREATININE: 1.84 mg/dL — AB (ref 0.61–1.24)
Chloride: 104 mmol/L (ref 101–111)
GFR, EST AFRICAN AMERICAN: 43 mL/min — AB (ref >60)
GFR, EST NON AFRICAN AMERICAN: 37 mL/min — AB (ref >60)
Glucose, Bld: 98 mg/dL (ref 65–99)
Potassium: 3.3 mmol/L — ABNORMAL LOW (ref 3.5–5.1)
SODIUM: 132 mmol/L — AB (ref 135–145)

## 2015-04-30 LAB — TROPONIN I: Troponin I: <0.03 ng/mL (ref <0.031)

## 2015-04-30 MED ORDER — POTASSIUM CHLORIDE 10 MEQ/100ML IV SOLN
10.0000 meq | INTRAVENOUS | Status: AC
  Administered 2015-04-30 (×2): 10 meq via INTRAVENOUS
  Filled 2015-04-30 (×2): qty 100

## 2015-04-30 MED ORDER — ASPIRIN 81 MG PO CHEW
324.0000 mg | CHEWABLE_TABLET | Freq: Once | ORAL | Status: DC
  Filled 2015-04-30: qty 4

## 2015-04-30 MED ORDER — POTASSIUM CHLORIDE CRYS ER 20 MEQ PO TBCR
40.0000 meq | EXTENDED_RELEASE_TABLET | Freq: Once | ORAL | Status: AC
  Administered 2015-04-30: 40 meq via ORAL
  Filled 2015-04-30: qty 2

## 2015-04-30 MED ORDER — SODIUM CHLORIDE 0.9 % IV BOLUS (SEPSIS)
1000.0000 mL | Freq: Once | INTRAVENOUS | Status: AC
  Administered 2015-04-30: 1000 mL via INTRAVENOUS

## 2015-04-30 NOTE — ED Provider Notes (Signed)
[facility] Emergency Department Provider Note ____________________________________________  Time seen: Approximately 5:40 PM  I have reviewed the triage vital signs and the nursing notes.   HISTORY  Chief Complaint Chest Pain and Dizziness   HPI Casey French is a 65 y.o. male with a history of coronary artery disease, diabetes, diverticulitis who presents to the ER after he had a near syncopal episode when he stood up to answer the door and noted fluttering in his chest with whole body tingling. The family interpreted this as chest pain and administered 2 nitroglycerin. On EMS arrival patient's blood pressure was 77/44; they gave an IV fluid bolus of which patient received approximately 700 mL resulting in a blood pressure of 95/55. Patient notes he has been lightheaded for a while. He states that he got ill on Friday, March 31 and had vomiting, dry heaves, and diarrhea with last episodes on Sunday, April 2; he states he vomited every time he tried to drink or eat. He initially thought he had diverticulitis because of abdominal pain but the pain has resolved. He did have some dark urine. Today is the first day he has been able to tolerate solid foods; prior to this he was eating Jell-O and drinking liquids.  No one else at home has had a similar illness.  On my history patient denies ever having chest pain or pressure. He states the symptom he was feeling in his chest was a "fluttery, skipping beats" feeling.  He states he otherwise felt better today and denies any abdominal pain, vomiting or or diarrhea in the past few days.  Past Medical History  Diagnosis Date  . Myocardial infarction ([place])   . Hypertension   . Diabetes mellitus without complication ([place])   . High cholesterol   . Depression   . Gout     Patient Active Problem List   Diagnosis Date Noted  . Hypertensive urgency 06/14/2014  . Diverticulitis 06/14/2014  . AKI (acute kidney injury) ([place])  06/14/2014  . Benign essential HTN 06/14/2014  . Hyperlipidemia 06/14/2014  . DM type 2 (diabetes mellitus, type 2) ([place]) 06/14/2014  . CAD (coronary artery disease) 06/14/2014  . GERD without esophagitis 06/14/2014  . Closed head injury 03/24/2013    Past Surgical History  Procedure Laterality Date  . Cardiac catheterization    . Right hand surgery      3 screws in right hand    Current Outpatient Rx  Name  Route  Sig  Dispense  Refill  . allopurinol (ZYLOPRIM) 100 MG tablet   Oral   Take 100 mg by mouth daily.         Marland Kitchen aspirin EC 81 MG tablet   Oral   Take 81 mg by mouth daily.         Marland Kitchen aspirin-acetaminophen-caffeine (EXCEDRIN MIGRAINE) 250-250-65 MG tablet   Oral   Take 1 tablet by mouth every 6 (six) hours as needed for headache.         . ciprofloxacin (CIPRO) 500 MG tablet   Oral   Take 500 mg by mouth 2 (two) times daily.         . hydrochlorothiazide (HYDRODIURIL) 12.5 MG tablet   Oral   Take 12.5 mg by mouth daily. Pt takes with a 25mg  tablet.         . hydrochlorothiazide (HYDRODIURIL) 25 MG tablet   Oral   Take 25 mg by mouth daily. Pt takes with a 12.5mg  tablet.         Marland Kitchen  isosorbide mononitrate (IMDUR) 60 MG 24 hr tablet   Oral   Take 60 mg by mouth daily.         Marland Kitchen lisinopril (ZESTRIL) 5 MG tablet   Oral   Take 1 tablet (5 mg total) by mouth daily.   30 tablet   0   . metFORMIN (GLUCOPHAGE-XR) 500 MG 24 hr tablet   Oral   Take 500 mg by mouth 2 (two) times daily.          . metoprolol succinate (TOPROL-XL) 100 MG 24 hr tablet   Oral   Take 100 mg by mouth daily.         . metroNIDAZOLE (FLAGYL) 500 MG tablet   Oral   Take 500 mg by mouth 3 (three) times daily.         . nitroGLYCERIN (NITROSTAT) 0.4 MG SL tablet   Sublingual   Place 0.4 mg under the tongue every 5 (five) minutes as needed for chest pain.         Marland Kitchen omeprazole (PRILOSEC) 20 MG capsule   Oral   Take 20 mg by mouth daily.         . rosuvastatin  (CRESTOR) 20 MG tablet   Oral   Take 20 mg by mouth daily.           Allergies Morphine and related  Family History  Problem Relation Age of Onset  . CAD Other     Social History Social History  Substance Use Topics  . Smoking status: Former Smoker    Quit date: 03/24/2001  . Smokeless tobacco: Never Used  . Alcohol Use: No    Review of Systems Constitutional: No fever Cardiovascular: Denies chest pain. Respiratory: Denies shortness of breath. Gastrointestinal: No abdominal pain.  No nausea, no vomiting.  No diarrhea.  No constipation. Genitourinary: Negative for dysuria. Musculoskeletal: Negative for back pain. Skin: Negative for rash. Neurological: Negative for headaches, focal weakness or numbness.  10-point ROS otherwise negative.  ____________________________________________   PHYSICAL EXAM:  VITAL SIGNS: ED Triage Vitals  Enc Vitals Group     BP 04/30/15 1734 96/65 mmHg     Pulse Rate 04/30/15 1734 82     Resp 04/30/15 1734 18     Temp --      Temp src --      SpO2 04/30/15 1734 97 %     Weight 04/30/15 1734 175 lb (79.379 kg)     Height 04/30/15 1734 5' 9.5" (1.765 m)     Head Cir --      Peak Flow --      Pain Score --      Pain Loc --      Pain Edu? --      Excl. in [place]? --    Constitutional: Alert and oriented. Well appearing and in no acute distress. Eyes: Conjunctivae are normal. PERRL. EOMI. Head: Atraumatic. Nose: No congestion/rhinnorhea. Mouth/Throat: Mucous membranes are moist.  Oropharynx non-erythematous. edentulous Neck: No stridor.   Cardiovascular: Normal rate, regular rhythm. Grossly normal heart sounds.  Good peripheral circulation. Respiratory: Normal respiratory effort.  No retractions. Lungs CTAB. Gastrointestinal: Soft and nontender. No distention. No abdominal bruits. No CVA tenderness. Musculoskeletal: No lower extremity tenderness nor edema.   Neurologic:  Normal speech and language. No gross focal neurologic deficits  are appreciated.  Skin:  Skin is warm, dry and intact. No rash noted. Psychiatric: Mood and affect are normal. Speech and behavior are normal.  ____________________________________________  LABS (all labs ordered are listed, but only abnormal results are displayed)  Labs Reviewed  CBC WITH DIFFERENTIAL/PLATELET - Abnormal; Notable for the following:    WBC 13.1 (*)    RBC 3.54 (*)    Hemoglobin 11.1 (*)    HCT 31.7 (*)    Neutro Abs 9.9 (*)    Monocytes Absolute 1.3 (*)    All other components within normal limits  COMPREHENSIVE METABOLIC PANEL - Abnormal; Notable for the following:    Sodium 134 (*)    Potassium 2.7 (*)    Glucose, Bld 100 (*)    BUN 43 (*)    Creatinine, Ser 2.05 (*)    Calcium 7.8 (*)    Total Protein 5.4 (*)    Albumin 2.4 (*)    ALT 13 (*)    GFR calc non Af Amer 33 (*)    GFR calc Af Amer 38 (*)    All other components within normal limits  BASIC METABOLIC PANEL - Abnormal; Notable for the following:    Sodium 132 (*)    Potassium 3.3 (*)    CO2 21 (*)    BUN 43 (*)    Creatinine, Ser 1.84 (*)    Calcium 7.6 (*)    GFR calc non Af Amer 37 (*)    GFR calc Af Amer 43 (*)    All other components within normal limits  TROPONIN I  TROPONIN I  URINALYSIS COMPLETEWITH MICROSCOPIC ([facility] ONLY)   ____________________________________________  EKG  ED ECG REPORT I, Ponciano Ort, the attending physician, personally viewed and interpreted this ECG.   Date: 04/30/2015  EKG Time: 1740  Rate: 78  Rhythm:nsr  Axis: nl  Intervals:qtc 540  ST&T Change: none  ED ECG REPORT I, Ponciano Ort, the attending physician, personally viewed and interpreted this ECG.   Date: 05/01/2015  EKG Time: 2355  Rate: 84  Rhythm: nsr  Axis: nl  Intervals:nl  ST&T Change: nonspec  ____________________________________________  RADIOLOGY  cxr-IMPRESSION: Stable right base linear scarring. No acute cardiopulmonary findings.   Electronically  Signed By: Andreas Newport M.D. On: 04/30/2015 23:56  ____________________________________________   INITIAL IMPRESSION / ASSESSMENT AND PLAN / ED COURSE  Pertinent labs & imaging results that were available during my care of the patient were reviewed by me and considered in my medical decision making (see chart for details).  Patient with significant AKI and hypokalemia. Will hydrate and replete potassium and reassess. ----------------------------------------- 11:27 PM on 04/30/2015 -----------------------------------------  Patient now reports that he is having some central chest tightness that started a few minutes ago.  He reports that he is been having more frequent episodes of chest pressure over the past 2 months and did discuss this with [name] who recently did a stress test that was negative. Patient states it has been years since he has had a cardiac catheterization.  Will admit the patient for cycling cardiac enzymes, continued IV hydration, and cardiology consultation.  Filed Vitals:   04/30/15 1734 04/30/15 2037  BP: 96/65 104/64  Pulse: 82 83  Resp: 18 16    ____________________________________________   FINAL CLINICAL IMPRESSION(S) / ED DIAGNOSES  Final diagnoses:  Near syncope  Orthostatic hypotension  Dehydration  Hypokalemia due to loss of potassium      New prescriptions started this visit New Prescriptions   No medications on file     Ponciano Ort, MD 05/01/15 [id number]

## 2015-04-30 NOTE — ED Notes (Signed)
Pt reports having a "heaviness" in chest. MD made aware.

## 2015-04-30 NOTE — ED Notes (Signed)
Patient transported to X-ray 

## 2015-04-30 NOTE — ED Notes (Signed)
Pt arrived via EMS from home for complaints of chest pain radiating to neck and jaw.Pt describes pain as tingling. Pt reports dizziness and shortness of breath. Pt family administered nitroglycerin x 2 and 162 mg ASA. Upon EMS arrival pt BP77/44.EMS administered IV fluids, BP up  To 95/55.

## 2015-05-01 ENCOUNTER — Encounter: Payer: Self-pay | Admitting: Internal Medicine

## 2015-05-01 DIAGNOSIS — I208 Other forms of angina pectoris: Secondary | ICD-10-CM | POA: Diagnosis present

## 2015-05-01 DIAGNOSIS — I2089 Other forms of angina pectoris: Secondary | ICD-10-CM | POA: Diagnosis present

## 2015-05-01 LAB — BASIC METABOLIC PANEL
Anion gap: 6 (ref 5–15)
BUN: 34 mg/dL — AB (ref 6–20)
CALCIUM: 7.8 mg/dL — AB (ref 8.9–10.3)
CO2: 22 mmol/L (ref 22–32)
CREATININE: 1.76 mg/dL — AB (ref 0.61–1.24)
Chloride: 106 mmol/L (ref 101–111)
GFR calc Af Amer: 45 mL/min — ABNORMAL LOW (ref >60)
GFR calc non Af Amer: 39 mL/min — ABNORMAL LOW (ref >60)
GLUCOSE: 94 mg/dL (ref 65–99)
POTASSIUM: 2.9 mmol/L — AB (ref 3.5–5.1)
Sodium: 134 mmol/L — ABNORMAL LOW (ref 135–145)

## 2015-05-01 LAB — URINALYSIS COMPLETE WITH MICROSCOPIC (ARMC ONLY)
Bacteria, UA: NONE SEEN
Bilirubin Urine: NEGATIVE
GLUCOSE, UA: NEGATIVE mg/dL
LEUKOCYTES UA: NEGATIVE
NITRITE: NEGATIVE
Protein, ur: 100 mg/dL — AB
SPECIFIC GRAVITY, URINE: 1.009 (ref 1.005–1.030)
Squamous Epithelial / LPF: NONE SEEN
pH: 6 (ref 5.0–8.0)

## 2015-05-01 LAB — CBC
HCT: 30 % — ABNORMAL LOW (ref 40.0–52.0)
Hemoglobin: 10.5 g/dL — ABNORMAL LOW (ref 13.0–18.0)
MCH: 31.1 pg (ref 26.0–34.0)
MCHC: 35 g/dL (ref 32.0–36.0)
MCV: 88.7 fL (ref 80.0–100.0)
PLATELETS: 211 10*3/uL (ref 150–440)
RBC: 3.38 MIL/uL — ABNORMAL LOW (ref 4.40–5.90)
RDW: 13.4 % (ref 11.5–14.5)
WBC: 13.1 10*3/uL — ABNORMAL HIGH (ref 3.8–10.6)

## 2015-05-01 LAB — GLUCOSE, CAPILLARY
GLUCOSE-CAPILLARY: 98 mg/dL (ref 65–99)
Glucose-Capillary: 90 mg/dL (ref 65–99)

## 2015-05-01 LAB — MAGNESIUM: Magnesium: 1.6 mg/dL — ABNORMAL LOW (ref 1.7–2.4)

## 2015-05-01 LAB — TROPONIN I

## 2015-05-01 LAB — POTASSIUM: Potassium: 3.1 mmol/L — ABNORMAL LOW (ref 3.5–5.1)

## 2015-05-01 MED ORDER — ASPIRIN EC 81 MG PO TBEC
81.0000 mg | DELAYED_RELEASE_TABLET | Freq: Every day | ORAL | Status: DC
  Administered 2015-05-01: 81 mg via ORAL
  Filled 2015-05-01: qty 1

## 2015-05-01 MED ORDER — INSULIN ASPART 100 UNIT/ML ~~LOC~~ SOLN: 0.0000 [IU] | Freq: Three times a day (TID) | SUBCUTANEOUS | Status: DC

## 2015-05-01 MED ORDER — ACETAMINOPHEN 650 MG RE SUPP: 650.0000 mg | Freq: Four times a day (QID) | RECTAL | Status: DC | PRN

## 2015-05-01 MED ORDER — MAGNESIUM SULFATE 2 GM/50ML IV SOLN
2.0000 g | Freq: Once | INTRAVENOUS | Status: AC
  Administered 2015-05-01: 2 g via INTRAVENOUS
  Filled 2015-05-01: qty 50

## 2015-05-01 MED ORDER — ACETAMINOPHEN 325 MG PO TABS: 650.0000 mg | ORAL_TABLET | Freq: Four times a day (QID) | ORAL | Status: DC | PRN

## 2015-05-01 MED ORDER — SODIUM CHLORIDE 0.9% FLUSH
3.0000 mL | Freq: Two times a day (BID) | INTRAVENOUS | Status: DC
  Administered 2015-05-01: 3 mL via INTRAVENOUS

## 2015-05-01 MED ORDER — HEPARIN SODIUM (PORCINE) 5000 UNIT/ML IJ SOLN
5000.0000 [IU] | Freq: Three times a day (TID) | INTRAMUSCULAR | Status: DC
  Administered 2015-05-01 (×2): 5000 [IU] via SUBCUTANEOUS
  Filled 2015-05-01 (×2): qty 1

## 2015-05-01 MED ORDER — METOPROLOL SUCCINATE ER 100 MG PO TB24
100.0000 mg | ORAL_TABLET | Freq: Every day | ORAL | Status: DC
  Administered 2015-05-01: 100 mg via ORAL
  Filled 2015-05-01: qty 1

## 2015-05-01 MED ORDER — ROSUVASTATIN CALCIUM 20 MG PO TABS
20.0000 mg | ORAL_TABLET | Freq: Every day | ORAL | Status: DC
Start: 2015-05-01 — End: 2015-05-01
  Administered 2015-05-01: 20 mg via ORAL
  Filled 2015-05-01: qty 1

## 2015-05-01 MED ORDER — POTASSIUM CHLORIDE CRYS ER 20 MEQ PO TBCR
60.0000 meq | EXTENDED_RELEASE_TABLET | Freq: Once | ORAL | Status: AC
  Administered 2015-05-01: 60 meq via ORAL
  Filled 2015-05-01: qty 3

## 2015-05-01 MED ORDER — NITROGLYCERIN 0.4 MG SL SUBL: 0.4000 mg | SUBLINGUAL_TABLET | SUBLINGUAL | Status: DC | PRN

## 2015-05-01 MED ORDER — ONDANSETRON HCL 4 MG/2ML IJ SOLN: 4.0000 mg | Freq: Four times a day (QID) | INTRAMUSCULAR | Status: DC | PRN

## 2015-05-01 MED ORDER — POTASSIUM CHLORIDE 10 MEQ/100ML IV SOLN
10.0000 meq | INTRAVENOUS | Status: DC
  Administered 2015-05-01: 10 meq via INTRAVENOUS
  Filled 2015-05-01 (×4): qty 100

## 2015-05-01 MED ORDER — ISOSORBIDE MONONITRATE ER 60 MG PO TB24
60.0000 mg | ORAL_TABLET | Freq: Every day | ORAL | Status: DC
Start: 2015-05-01 — End: 2015-05-01
  Administered 2015-05-01: 60 mg via ORAL
  Filled 2015-05-01: qty 1

## 2015-05-01 MED ORDER — PANTOPRAZOLE SODIUM 40 MG PO TBEC
40.0000 mg | DELAYED_RELEASE_TABLET | Freq: Every day | ORAL | Status: DC
  Administered 2015-05-01: 40 mg via ORAL
  Filled 2015-05-01: qty 1

## 2015-05-01 MED ORDER — SODIUM CHLORIDE 0.9 % IV SOLN
INTRAVENOUS | Status: AC
  Administered 2015-05-01: 03:00:00 via INTRAVENOUS

## 2015-05-01 MED ORDER — ONDANSETRON HCL 4 MG PO TABS: 4.0000 mg | ORAL_TABLET | Freq: Four times a day (QID) | ORAL | Status: DC | PRN

## 2015-05-01 MED ORDER — INSULIN ASPART 100 UNIT/ML ~~LOC~~ SOLN: 0.0000 [IU] | Freq: Every day | SUBCUTANEOUS | Status: DC

## 2015-05-01 NOTE — Discharge Summary (Signed)
[facility] [place] NAME: Casey French    MR#:  [id number]  DATE OF BIRTH:  1950-07-15  DATE OF ADMISSION:  04/30/2015 ADMITTING PHYSICIAN: Lance Coon, MD  DATE OF DISCHARGE: 05/01/2015  PRIMARY CARE PHYSICIAN: [name], MD    ADMISSION DIAGNOSIS:  Orthostatic hypotension [I95.1] Dehydration [E86.0] Near syncope [R55] Hypokalemia due to loss of potassium [E87.6]  DISCHARGE DIAGNOSIS:  Principal Problem:   Angina at rest [facility]) Active Problems:   AKI (acute kidney injury) ([place])   Benign essential HTN   DM type 2 (diabetes mellitus, type 2) (HCC)   CAD (coronary artery disease)   GERD without esophagitis   SECONDARY DIAGNOSIS:   Past Medical History  Diagnosis Date  . Myocardial infarction ([place])   . Hypertension   . Diabetes mellitus without complication ([place])   . High cholesterol   . Depression   . Gout     HOSPITAL COURSE:   65 year old male with a history of CAD and essential hypertension who presented with chest pain. For further details please refer the H&P.  1. Atypical chest pain: Troponins 3 were negative. Telemetry showed no arrhythmia. Patient had a recent stress test which essentially was normal. He was evaluated by Dr Clayborn Bigness during this stay. Since his cardiac workup was normal and he had no chest pain after admission, it was recommended that he  follow up with cardiology as outpatient.  2. Acute kidney injury: This is due to dehydration from recent acute diverticulitis flare and dehydration. Creatinine has improved with IV fluids.  3. Acute diverticulitis: Patient has been on ciprofloxacin and Flagyl which she will continue for total treatment of 14 days.  4. ASCVD: Patient will continue Imdur, lisinopril, Crestor and Toprol.  5. Diabetes: Patient continue on metformin and ADA diet.   DISCHARGE CONDITIONS AND DIET:   Stable for discharge on a heart healthy diabetic diet  CONSULTS  OBTAINED:  Treatment Team:  Yolonda Kida, MD  DRUG ALLERGIES:   Allergies  Allergen Reactions  . Morphine And Related Anxiety    DISCHARGE MEDICATIONS:   Current Discharge Medication List    CONTINUE these medications which have NOT CHANGED   Details  allopurinol (ZYLOPRIM) 100 MG tablet Take 100 mg by mouth daily.    aspirin EC 81 MG tablet Take 81 mg by mouth daily.    aspirin-acetaminophen-caffeine (EXCEDRIN MIGRAINE) 250-250-65 MG tablet Take 1 tablet by mouth every 6 (six) hours as needed for headache.    ciprofloxacin (CIPRO) 500 MG tablet Take 500 mg by mouth 2 (two) times daily.    !! hydrochlorothiazide (HYDRODIURIL) 12.5 MG tablet Take 12.5 mg by mouth daily. Pt takes with a 25mg  tablet.    !! hydrochlorothiazide (HYDRODIURIL) 25 MG tablet Take 25 mg by mouth daily. Pt takes with a 12.5mg  tablet.    isosorbide mononitrate (IMDUR) 60 MG 24 hr tablet Take 60 mg by mouth daily.    lisinopril (ZESTRIL) 5 MG tablet Take 1 tablet (5 mg total) by mouth daily. Qty: 30 tablet, Refills: 0    metFORMIN (GLUCOPHAGE-XR) 500 MG 24 hr tablet Take 500 mg by mouth 2 (two) times daily.     metoprolol succinate (TOPROL-XL) 100 MG 24 hr tablet Take 100 mg by mouth daily.    metroNIDAZOLE (FLAGYL) 500 MG tablet Take 500 mg by mouth 3 (three) times daily.    nitroGLYCERIN (NITROSTAT) 0.4 MG SL tablet Place 0.4 mg under the tongue every 5 (five) minutes  as needed for chest pain.    omeprazole (PRILOSEC) 20 MG capsule Take 20 mg by mouth daily.    rosuvastatin (CRESTOR) 20 MG tablet Take 20 mg by mouth daily.     !! - Potential duplicate medications found. Please discuss with provider.            Today   CHIEF COMPLAINT:  Patient is doing well this morning. Patient has no symptoms of shortness of breath or chest pain   VITAL SIGNS:  Blood pressure 136/76, pulse 81, temperature 97.3 F (36.3 C), temperature source Oral, resp. rate 18, height 5' 9.5" (1.765 m),  weight 73.029 kg (161 lb), SpO2 95 %.   REVIEW OF SYSTEMS:  Review of Systems  Constitutional: Negative for fever, chills and malaise/fatigue.  HENT: Negative for ear discharge, ear pain, hearing loss, nosebleeds and sore throat.   Eyes: Negative for blurred vision and pain.  Respiratory: Negative for cough, hemoptysis, shortness of breath and wheezing.   Cardiovascular: Negative for chest pain, palpitations and leg swelling.  Gastrointestinal: Negative for nausea, vomiting, abdominal pain, diarrhea and blood in stool.  Genitourinary: Negative for dysuria.  Musculoskeletal: Negative for back pain.  Neurological: Negative for dizziness, tremors, speech change, focal weakness, seizures and headaches.  Endo/Heme/Allergies: Does not bruise/bleed easily.  Psychiatric/Behavioral: Negative for depression, suicidal ideas and hallucinations.     PHYSICAL EXAMINATION:  GENERAL:  65 y.o.-year-old patient lying in the bed with no acute distress.  NECK:  Supple, no jugular venous distention. No thyroid enlargement, no tenderness.  LUNGS: Normal breath sounds bilaterally, no wheezing, rales,rhonchi  No use of accessory muscles of respiration.  CARDIOVASCULAR: S1, S2 normal. 2/6 SEM No rubs, or gallops.  ABDOMEN: Soft, non-tender, non-distended. Bowel sounds present. No organomegaly or mass.  EXTREMITIES: No pedal edema, cyanosis, or clubbing.  PSYCHIATRIC: The patient is alert and oriented x 3.  SKIN: No obvious rash, lesion, or ulcer.   DATA REVIEW:   CBC  Recent Labs Lab 05/01/15 0525  WBC 13.1*  HGB 10.5*  HCT 30.0*  PLT 211    Chemistries   Recent Labs Lab 04/30/15 1825  05/01/15 0525  NA 134*  < > 134*  K 2.7*  < > 2.9*  CL 102  < > 106  CO2 22  < > 22  GLUCOSE 100*  < > 94  BUN 43*  < > 34*  CREATININE 2.05*  < > 1.76*  CALCIUM 7.8*  < > 7.8*  MG  --   --  1.6*  AST 22  --   --   ALT 13*  --   --   ALKPHOS 47  --   --   BILITOT 0.5  --   --   < > = values in this  interval not displayed.  Cardiac Enzymes  Recent Labs Lab 04/30/15 1825 04/30/15 2133 05/01/15 0303  TROPONINI <0.03 <0.03 <0.03    Microbiology Results  @MICRORSLT48 @  RADIOLOGY:  Dg Chest 2 View  04/30/2015  CLINICAL DATA:  Chest pain radiating to the neck and jaw for 1 week. EXAM: CHEST  2 VIEW COMPARISON:  09/26/2012 FINDINGS: There is stable linear scarring in the right base. The lungs are otherwise clear. There is no pleural effusion. Pulmonary vasculature is normal. Hilar, mediastinal and cardiac contours are unremarkable and unchanged. IMPRESSION: Stable right base linear scarring. No acute cardiopulmonary findings. Electronically Signed   By: Andreas Newport M.D.   On: 04/30/2015 23:56      Management  plans discussed with the patient and he is in agreement. Stable for discharge home  Patient should follow up with CARDIOLOGY 1 week  CODE STATUS:     Code Status Orders        Start     Ordered   05/01/15 0229  Full code   Continuous     05/01/15 0229    Code Status History    Date Active Date Inactive Code Status Order ID Comments User Context   06/14/2014  1:13 AM 06/16/2014  4:50 PM Full Code [id number]  [name], MD ED   03/24/2013  3:52 AM 03/28/2013  2:02 PM Full Code [id number]  Hosie Spangle, MD Inpatient      TOTAL TIME TAKING CARE OF THIS PATIENT: 35 minutes.    Note: This dictation was prepared with Dragon dictation along with smaller phrase technology. Any transcriptional errors that result from this process are unintentional.  Lenette Rau M.D on 05/01/2015 at 12:03 PM  Between 7am to 6pm - Pager - [phone] After 6pm go to [email or web address] - password [email or web address] [facility]  [place]  Office  [phone]  CC: Primary care physician; [name], MD

## 2015-05-01 NOTE — Progress Notes (Signed)
Pharmacy Consult for electrolyte replacement   Recent Labs  04/30/15 1825 04/30/15 2133 05/01/15 0525 05/01/15 1336  NA 134* 132* 134*  --   K 2.7* 3.3* 2.9* 3.1*  CL 102 104 106  --   CO2 22 21* 22  --   GLUCOSE 100* 98 94  --   BUN 43* 43* 34*  --   CREATININE 2.05* 1.84* 1.76*  --   CALCIUM 7.8* 7.6* 7.8*  --   MG  --   --  1.6*  --   PROT 5.4*  --   --   --   ALBUMIN 2.4*  --   --   --   AST 22  --   --   --   ALT 13*  --   --   --   ALKPHOS 47  --   --   --   BILITOT 0.5  --   --   --    Estimated Creatinine Clearance: 43.1 mL/min (by C-G formula based on Cr of 1.76).    Assessment: Patient given 42mEq oral and 63mEq IV potassium as well as 2gm IV magnesium, repeat potassium 3.1. MD ordered additional 72mEq PO  Plan:  Will recheck with AM labs  Seydina Holliman C 05/01/2015,3:12 PM

## 2015-05-01 NOTE — Progress Notes (Signed)
Notified Dr. Benjie Karvonen of potassium level 3.1. Per MD give potassium 60 mEq once, okay for patient to discharge home. Patient scheduled for cardiology as outpatient.

## 2015-05-01 NOTE — Progress Notes (Signed)
Patient ambulated around the nursing station 2 times. Patient had no complaints of pain, no SOB.

## 2015-05-01 NOTE — Progress Notes (Signed)
Patient received discharge instructions, pt verbalized understanding. Patient made aware of follow up appointments. IV was removed with no signs of infection. Dressing clean, dry intact. No skin tears or wounds present. Patient was escorted out with staff member via wheelchair via private auto. No further needs from care management team.

## 2015-05-01 NOTE — H&P (Signed)
[facility] [facility] [place] NAME: Casey French    MR#:  [id number]  DATE OF BIRTH:  1950/06/12  DATE OF ADMISSION:  04/30/2015  PRIMARY CARE PHYSICIAN: [name], MD   REQUESTING/REFERRING PHYSICIAN: Robet Leu, MD  CHIEF COMPLAINT:   Chief Complaint  Patient presents with  . Chest Pain  . Dizziness    HISTORY OF PRESENT ILLNESS:  Casey French  is a 65 y.o. male who presents with dizziness, presyncopal episode. Patient states that he recently had a flare of his diverticulitis, and had very poor by mouth intake for several days due to nausea and vomiting.Patient states he does have a history of CAD with prior placement of one stent, which is now "completely blocked". He recently had a stress test done by his cardiologist and was told this was normal. This is in the last several weeks. While he was here in the ED he developed chest discomfort. He had 2 negative sets of cardiac enzymes in the ED. Hospitalists were called for admission.   PAST MEDICAL HISTORY:   Past Medical History  Diagnosis Date  . Myocardial infarction ([place])   . Hypertension   . Diabetes mellitus without complication ([place])   . High cholesterol   . Depression   . Gout     PAST SURGICAL HISTORY:   Past Surgical History  Procedure Laterality Date  . Cardiac catheterization    . Right hand surgery      3 screws in right hand    SOCIAL HISTORY:   Social History  Substance Use Topics  . Smoking status: Former Smoker    Quit date: 03/24/2001  . Smokeless tobacco: Never Used  . Alcohol Use: No    FAMILY HISTORY:   Family History  Problem Relation Age of Onset  . CAD Other     DRUG ALLERGIES:   Allergies  Allergen Reactions  . Morphine And Related Anxiety    MEDICATIONS AT HOME:   Prior to Admission medications   Medication Sig Start Date End Date Taking? Authorizing Provider  allopurinol (ZYLOPRIM) 100 MG tablet Take 100 mg by mouth  daily.   Yes Historical Provider, MD  aspirin EC 81 MG tablet Take 81 mg by mouth daily.   Yes Historical Provider, MD  aspirin-acetaminophen-caffeine (EXCEDRIN MIGRAINE) [phone] MG tablet Take 1 tablet by mouth every 6 (six) hours as needed for headache.   Yes Historical Provider, MD  ciprofloxacin (CIPRO) 500 MG tablet Take 500 mg by mouth 2 (two) times daily. 04/18/15 05/02/15 Yes Historical Provider, MD  hydrochlorothiazide (HYDRODIURIL) 12.5 MG tablet Take 12.5 mg by mouth daily. Pt takes with a 25mg  tablet.   Yes Historical Provider, MD  hydrochlorothiazide (HYDRODIURIL) 25 MG tablet Take 25 mg by mouth daily. Pt takes with a 12.5mg  tablet.   Yes Historical Provider, MD  isosorbide mononitrate (IMDUR) 60 MG 24 hr tablet Take 60 mg by mouth daily.   Yes Historical Provider, MD  lisinopril (ZESTRIL) 5 MG tablet Take 1 tablet (5 mg total) by mouth daily. 06/16/14  Yes Nicholes Mango, MD  metFORMIN (GLUCOPHAGE-XR) 500 MG 24 hr tablet Take 500 mg by mouth 2 (two) times daily.    Yes Historical Provider, MD  metoprolol succinate (TOPROL-XL) 100 MG 24 hr tablet Take 100 mg by mouth daily.   Yes Historical Provider, MD  metroNIDAZOLE (FLAGYL) 500 MG tablet Take 500 mg by mouth 3 (three) times daily. 04/25/15 05/02/15 Yes Historical Provider, MD  nitroGLYCERIN (NITROSTAT)  0.4 MG SL tablet Place 0.4 mg under the tongue every 5 (five) minutes as needed for chest pain.   Yes Historical Provider, MD  omeprazole (PRILOSEC) 20 MG capsule Take 20 mg by mouth daily.   Yes Historical Provider, MD  rosuvastatin (CRESTOR) 20 MG tablet Take 20 mg by mouth daily.   Yes Historical Provider, MD    REVIEW OF SYSTEMS:  Review of Systems  Constitutional: Negative for fever, chills, weight loss and malaise/fatigue.  HENT: Negative for ear pain, hearing loss and tinnitus.   Eyes: Negative for blurred vision, double vision, pain and redness.  Respiratory: Negative for cough, hemoptysis and shortness of breath.    Cardiovascular: Positive for chest pain. Negative for palpitations, orthopnea and leg swelling.  Gastrointestinal: Negative for nausea, vomiting, abdominal pain, diarrhea and constipation.  Genitourinary: Negative for dysuria, frequency and hematuria.  Musculoskeletal: Negative for back pain, joint pain and neck pain.  Skin:       No acne, rash, or lesions  Neurological: Negative for dizziness, tremors, focal weakness and weakness.  Endo/Heme/Allergies: Negative for polydipsia. Does not bruise/bleed easily.  Psychiatric/Behavioral: Negative for depression. The patient is not nervous/anxious and does not have insomnia.      VITAL SIGNS:   Filed Vitals:   04/30/15 1734 04/30/15 2037  BP: 96/65 104/64  Pulse: 82 83  Resp: 18 16  Height: 5' 9.5" (1.765 m)   Weight: 79.379 kg (175 lb)   SpO2: 97% 96%   Wt Readings from Last 3 Encounters:  04/30/15 79.379 kg (175 lb)  06/16/14 78.654 kg (173 lb 6.4 oz)  03/24/13 92.3 kg (203 lb 7.8 oz)    PHYSICAL EXAMINATION:  Physical Exam  Vitals reviewed. Constitutional: He is oriented to person, place, and time. He appears well-developed and well-nourished. No distress.  HENT:  Head: Normocephalic and atraumatic.  Mouth/Throat: Oropharynx is clear and moist.  Eyes: Conjunctivae and EOM are normal. Pupils are equal, round, and reactive to light. No scleral icterus.  Neck: Normal range of motion. Neck supple. No JVD present. No thyromegaly present.  Cardiovascular: Normal rate, regular rhythm and intact distal pulses.  Exam reveals no gallop and no friction rub.   No murmur heard. Respiratory: Effort normal and breath sounds normal. No respiratory distress. He has no wheezes. He has no rales.  GI: Soft. Bowel sounds are normal. He exhibits no distension. There is no tenderness.  Musculoskeletal: Normal range of motion. He exhibits no edema.  No arthritis, no gout  Lymphadenopathy:    He has no cervical adenopathy.  Neurological: He is  alert and oriented to person, place, and time. No cranial nerve deficit.  No dysarthria, no aphasia  Skin: Skin is warm and dry. No rash noted. No erythema.  Psychiatric: He has a normal mood and affect. His behavior is normal. Judgment and thought content normal.    LABORATORY PANEL:   CBC  Recent Labs Lab 04/30/15 1825  WBC 13.1*  HGB 11.1*  HCT 31.7*  PLT 233   ------------------------------------------------------------------------------------------------------------------  Chemistries   Recent Labs Lab 04/30/15 1825 04/30/15 2133  NA 134* 132*  K 2.7* 3.3*  CL 102 104  CO2 22 21*  GLUCOSE 100* 98  BUN 43* 43*  CREATININE 2.05* 1.84*  CALCIUM 7.8* 7.6*  AST 22  --   ALT 13*  --   ALKPHOS 47  --   BILITOT 0.5  --    ------------------------------------------------------------------------------------------------------------------  Cardiac Enzymes  Recent Labs Lab 04/30/15 2133  TROPONINI <0.03   ------------------------------------------------------------------------------------------------------------------  RADIOLOGY:  Dg Chest 2 View  04/30/2015  CLINICAL DATA:  Chest pain radiating to the neck and jaw for 1 week. EXAM: CHEST  2 VIEW COMPARISON:  09/26/2012 FINDINGS: There is stable linear scarring in the right base. The lungs are otherwise clear. There is no pleural effusion. Pulmonary vasculature is normal. Hilar, mediastinal and cardiac contours are unremarkable and unchanged. IMPRESSION: Stable right base linear scarring. No acute cardiopulmonary findings. Electronically Signed   By: Andreas Newport M.D.   On: 04/30/2015 23:56    EKG:   Orders placed or performed during the hospital encounter of 04/30/15  . EKG 12-Lead  . EKG 12-Lead  . Repeat EKG  . Repeat EKG    IMPRESSION AND PLAN:  Principal Problem:   Angina at rest [facility]) - unclear if truly cardiac or not. Patient states that he recently had a stress test done by his outpatient  cardiologist about 2 weeks ago and that this was "okay". Active Problems:   AKI (acute kidney injury) ([place]) - likely prerenal cause by very poor by mouth intake with superimposed nausea and vomiting. We'll hydrate him with IV fluids here tonight, avoid nephrotoxins, and watch for expected improvement.   Benign essential HTN - continue home meds   DM type 2 (diabetes mellitus, type 2) (HCC) - sliding scale insulin with corresponding glucose checks before meals at bedtime and carb modified diet   CAD (coronary artery disease) - continue home meds   GERD without esophagitis - home dose PPI  All the records are reviewed and case discussed with ED provider. Management plans discussed with the patient and/or family.  DVT PROPHYLAXIS: SubQ heparin  GI PROPHYLAXIS: PPI  ADMISSION STATUS: Observation  CODE STATUS: Full Code Status History    Date Active Date Inactive Code Status Order ID Comments User Context   06/14/2014  1:13 AM 06/16/2014  4:50 PM Full Code [id number]  [name], MD ED   03/24/2013  3:52 AM 03/28/2013  2:02 PM Full Code [id number]  Hosie Spangle, MD Inpatient      TOTAL TIME TAKING CARE OF THIS PATIENT: 45 minutes.    Beau Vanduzer Converse 05/01/2015, 1:27 AM  [facility]  Office  [phone]  CC: Primary care physician; [name], MD

## 2015-05-01 NOTE — Progress Notes (Signed)
Per Dr. Benjie Karvonen okay for patient to have a heart healthy diet.

## 2015-05-01 NOTE — Care Management Note (Signed)
Case Management Note  Patient Details  Name: Casey French MRN: [id number] Date of Birth: [birth year]  Subjective/Objective:   Cm consult for discharge planning. Met with patient at bedside. He is alert, oriented, independent, active and drives. Lives at home with his wife. PCP is Dr. Kary Kos. Denies issues accessing medical care, copays or transportation. No DC needs anticipated. Case Closed                 Action/Plan:   Expected Discharge Date:                  Expected Discharge Plan:  Home/Self Care  In-House Referral:     Discharge planning Services  CM Consult  Post Acute Care Choice:    Choice offered to:     DME Arranged:    DME Agency:     HH Arranged:    HH Agency:     Status of Service:  Completed, signed off  Medicare Important Message Given:    Date Medicare IM Given:    Medicare IM give by:    Date Additional Medicare IM Given:    Additional Medicare Important Message give by:     If discussed at [place] of Stay Meetings, dates discussed:    Additional Comments:  Jolly Mango, RN 05/01/2015, 10:04 AM

## 2015-05-01 NOTE — Care Management Obs Status (Signed)
[place] NOTIFICATION   Patient Details  Name: Casey French MRN: [id number] Date of Birth: 02/12/1950   Medicare Observation Status Notification Given:  Yes    Jolly Mango, RN 05/01/2015, 1:56 PM

## 2015-05-01 NOTE — Consult Note (Signed)
Reason for Consult: Abnormal EKG known coronary disease and angina prolonged QT Referring Physician: Dr. Lance French hospitalist Cardiologist Dr. Oran French is an 65 y.o. male.  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 Patient has been treated medically. Recently had a functional study because of vague chest discomfort functional study feels to show any evidence of significant ischemia so he's been treated medically. Patient is followed by Dr. Saralyn French and was seen less than a month ago. The patient presented with diverticulitis symptoms as well as vague chest pain symptoms being treated with antibiotic therapy states to be somewhat improved. Patient has significant renal insufficiency with slightly worsened because of dehydration with his brother diverticulitis. Denies any blackout spells or syncope no significant bleeding still has recurrent vague chest discomfort from time to time not related to exertion.  Past Medical History  Diagnosis Date  . Myocardial infarction ([place])   . Hypertension   . Diabetes mellitus without complication ([place])   . High cholesterol   . Depression   . Gout     Past Surgical History  Procedure Laterality Date  . Cardiac catheterization    . Right hand surgery      3 screws in right hand    Family History  Problem Relation Age of Onset  . CAD Other     Social History:  reports that he quit smoking about 14 years ago. He has never used smokeless tobacco. He reports that he does not drink alcohol or use illicit drugs.  Allergies:  Allergies  Allergen Reactions  . Morphine And Related Anxiety    Medications: I have reviewed the patient's current medications.  Results for orders placed or performed during the hospital encounter of 04/30/15 (from the past 48 hour(s))  CBC with Differential      Status: Abnormal   Collection Time: 04/30/15  6:25 PM  Result Value Ref Range   WBC 13.1 (H) 3.8 - 10.6 K/uL   RBC 3.54 (L) 4.40 - 5.90 MIL/uL   Hemoglobin 11.1 (L) 13.0 - 18.0 g/dL   HCT 31.7 (L) 40.0 - 52.0 %   MCV 89.7 80.0 - 100.0 fL   MCH 31.3 26.0 - 34.0 pg   MCHC 34.9 32.0 - 36.0 g/dL   RDW 13.3 11.5 - 14.5 %   Platelets 233 150 - 440 K/uL   Neutrophils Relative % 75 %   Neutro Abs 9.9 (H) 1.4 - 6.5 K/uL   Lymphocytes Relative 14 %   Lymphs Abs 1.8 1.0 - 3.6 K/uL   Monocytes Relative 10 %   Monocytes Absolute 1.3 (H) 0.2 - 1.0 K/uL   Eosinophils Relative 0 %   Eosinophils Absolute 0.0 0 - 0.7 K/uL   Basophils Relative 1 %   Basophils Absolute 0.1 0 - 0.1 K/uL  Troponin I     Status: None   Collection Time: 04/30/15  6:25 PM  Result Value Ref Range   Troponin I <0.03 <0.031 ng/mL    Comment:        NO INDICATION OF MYOCARDIAL INJURY.   Comprehensive metabolic panel     Status: Abnormal   Collection Time: 04/30/15  6:25 PM  Result Value Ref Range   Sodium 134 (L) 135 - 145 mmol/L   Potassium 2.7 (LL) 3.5 - 5.1 mmol/L    Comment: CRITICAL RESULT CALLED TO, READ BACK BY AND  VERIFIED WITH Casey Hollingshead RN AT 1900 04/30/15 MSS.    Chloride 102 101 - 111 mmol/L   CO2 22 22 - 32 mmol/L   Glucose, Bld 100 (H) 65 - 99 mg/dL   BUN 43 (H) 6 - 20 mg/dL   Creatinine, Ser 2.05 (H) 0.61 - 1.24 mg/dL   Calcium 7.8 (L) 8.9 - 10.3 mg/dL   Total Protein 5.4 (L) 6.5 - 8.1 g/dL   Albumin 2.4 (L) 3.5 - 5.0 g/dL   AST 22 15 - 41 U/L   ALT 13 (L) 17 - 63 U/L   Alkaline Phosphatase 47 38 - 126 U/L   Total Bilirubin 0.5 0.3 - 1.2 mg/dL   GFR calc non Af Amer 33 (L) >60 mL/min   GFR calc Af Amer 38 (L) >60 mL/min    Comment: (NOTE) The eGFR has been calculated using the CKD EPI equation. This calculation has not been validated in all clinical situations. eGFR's persistently <60 mL/min signify possible Chronic Kidney Disease.    Anion gap 10 5 - 15  Basic metabolic panel      Status: Abnormal   Collection Time: 04/30/15  9:33 PM  Result Value Ref Range   Sodium 132 (L) 135 - 145 mmol/L   Potassium 3.3 (L) 3.5 - 5.1 mmol/L   Chloride 104 101 - 111 mmol/L   CO2 21 (L) 22 - 32 mmol/L   Glucose, Bld 98 65 - 99 mg/dL   BUN 43 (H) 6 - 20 mg/dL   Creatinine, Ser 1.84 (H) 0.61 - 1.24 mg/dL   Calcium 7.6 (L) 8.9 - 10.3 mg/dL   GFR calc non Af Amer 37 (L) >60 mL/min   GFR calc Af Amer 43 (L) >60 mL/min    Comment: (NOTE) The eGFR has been calculated using the CKD EPI equation. This calculation has not been validated in all clinical situations. eGFR's persistently <60 mL/min signify possible Chronic Kidney Disease.    Anion gap 7 5 - 15  Troponin I     Status: None   Collection Time: 04/30/15  9:33 PM  Result Value Ref Range   Troponin I <0.03 <0.031 ng/mL    Comment:        NO INDICATION OF MYOCARDIAL INJURY.   Troponin I     Status: None   Collection Time: 05/01/15  3:03 AM  Result Value Ref Range   Troponin I <0.03 <0.031 ng/mL    Comment:        NO INDICATION OF MYOCARDIAL INJURY.   Basic metabolic panel     Status: Abnormal   Collection Time: 05/01/15  5:25 AM  Result Value Ref Range   Sodium 134 (L) 135 - 145 mmol/L   Potassium 2.9 (LL) 3.5 - 5.1 mmol/L    Comment: CRITICAL RESULT CALLED TO, READ BACK BY AND VERIFIED WITH Casey French 05/01/15 AT 0556 KLK    Chloride 106 101 - 111 mmol/L   CO2 22 22 - 32 mmol/L   Glucose, Bld 94 65 - 99 mg/dL   BUN 34 (H) 6 - 20 mg/dL   Creatinine, Ser 1.76 (H) 0.61 - 1.24 mg/dL   Calcium 7.8 (L) 8.9 - 10.3 mg/dL   GFR calc non Af Amer 39 (L) >60 mL/min   GFR calc Af Amer 45 (L) >60 mL/min    Comment: (NOTE) The eGFR has been calculated using the CKD EPI equation. This calculation has not been validated in all clinical situations. eGFR's persistently <60 mL/min signify  possible Chronic Kidney Disease.    Anion gap 6 5 - 15  CBC     Status: Abnormal   Collection Time: 05/01/15  5:25 AM  Result Value Ref  Range   WBC 13.1 (H) 3.8 - 10.6 K/uL   RBC 3.38 (L) 4.40 - 5.90 MIL/uL   Hemoglobin 10.5 (L) 13.0 - 18.0 g/dL   HCT 30.0 (L) 40.0 - 52.0 %   MCV 88.7 80.0 - 100.0 fL   MCH 31.1 26.0 - 34.0 pg   MCHC 35.0 32.0 - 36.0 g/dL   RDW 13.4 11.5 - 14.5 %   Platelets 211 150 - 440 K/uL  Magnesium     Status: Abnormal   Collection Time: 05/01/15  5:25 AM  Result Value Ref Range   Magnesium 1.6 (L) 1.7 - 2.4 mg/dL  Urinalysis complete, with microscopic     Status: Abnormal   Collection Time: 05/01/15  6:00 AM  Result Value Ref Range   Color, Urine STRAW (A) YELLOW   APPearance CLEAR (A) CLEAR   Glucose, UA NEGATIVE NEGATIVE mg/dL   Bilirubin Urine NEGATIVE NEGATIVE   Ketones, ur TRACE (A) NEGATIVE mg/dL   Specific Gravity, Urine 1.009 1.005 - 1.030   Hgb urine dipstick 1+ (A) NEGATIVE   pH 6.0 5.0 - 8.0   Protein, ur 100 (A) NEGATIVE mg/dL   Nitrite NEGATIVE NEGATIVE   Leukocytes, UA NEGATIVE NEGATIVE   RBC / HPF 0-5 0 - 5 RBC/hpf   WBC, UA 0-5 0 - 5 WBC/hpf   Bacteria, UA NONE SEEN NONE SEEN   Squamous Epithelial / LPF NONE SEEN NONE SEEN   Mucous PRESENT   Glucose, capillary     Status: None   Collection Time: 05/01/15  7:23 AM  Result Value Ref Range   Glucose-Capillary 98 65 - 99 mg/dL   Comment 1 Notify RN     Dg Chest 2 View  04/30/2015  CLINICAL DATA:  Chest pain radiating to the neck and jaw for 1 week. EXAM: CHEST  2 VIEW COMPARISON:  09/26/2012 FINDINGS: There is stable linear scarring in the right base. The lungs are otherwise clear. There is no pleural effusion. Pulmonary vasculature is normal. Hilar, mediastinal and cardiac contours are unremarkable and unchanged. IMPRESSION: Stable right base linear scarring. No acute cardiopulmonary findings. Electronically Signed   By: Andreas Newport M.D.   On: 04/30/2015 23:56    Review of Systems  Constitutional: Negative.   HENT: Negative.   Eyes: Negative.   Respiratory: Negative.   Cardiovascular: Negative.    Gastrointestinal: Positive for nausea, abdominal pain and diarrhea.  Genitourinary: Negative.   Musculoskeletal: Negative.   Skin: Negative.   Neurological: Negative.   Endo/Heme/Allergies: Negative.   Psychiatric/Behavioral: Negative.    Blood pressure 136/76, pulse 81, temperature 97.3 F (36.3 C), temperature source Oral, resp. rate 18, height 5' 9.5" (1.765 m), weight 73.029 kg (161 lb), SpO2 95 %. Physical Exam  Nursing note and vitals reviewed. Constitutional: He is oriented to person, place, and time. He appears well-developed and well-nourished.  HENT:  Head: Normocephalic and atraumatic.  Eyes: Conjunctivae and EOM are normal. Pupils are equal, round, and reactive to light.  Neck: Normal range of motion. Neck supple.  Cardiovascular: Normal rate and regular rhythm.   Respiratory: Effort normal and breath sounds normal.  GI: Soft. Bowel sounds are normal. He exhibits distension. There is tenderness.  Musculoskeletal: Normal range of motion.  Neurological: He is alert and oriented to person, place, and time.  He has normal reflexes.  Skin: Skin is warm.  Psychiatric: He has a normal mood and affect.    Assessment/Plan: Angina CAD Sob Abdominal pain Diverticulitis Hypokalemia DM 2 uncomplicated GERD AFI/CRI Hypertension Myocardial Infarction Abnormal EKg prolonged QT . PLAN Agree with rule out for myocardial infarction Corrected electrolytes especially potassium to above 4  Check magnesium and replace if necessary Agree with antibiotic therapy for diverticulitis Continue hypertension control Maintain diabetes management and control with NovoLog insulin Continue Protonix therapy for reflux symptoms Hyperlipidemia continue Crestor therapy  Continue metoprolol and Imdur for anginal symptoms Coronary disease management with aspirin and metoprolol imdur crestor Have the patient follow-up with cardiology as an outpatient in the next 2-3 weeks  Casey French  D. 05/01/2015, 2:06 PM

## 2015-05-02 ENCOUNTER — Encounter: Payer: Self-pay | Admitting: Emergency Medicine

## 2015-05-02 ENCOUNTER — Emergency Department
Admission: EM | Admit: 2015-05-02 | Discharge: 2015-05-02 | Disposition: A | Discharge status: Home / Self Care | Payer: Medicare Other | Attending: Emergency Medicine | Admitting: Emergency Medicine

## 2015-05-02 ENCOUNTER — Emergency Department: Payer: Medicare Other

## 2015-05-02 DIAGNOSIS — E785 Hyperlipidemia, unspecified: Secondary | ICD-10-CM | POA: Diagnosis not present

## 2015-05-02 DIAGNOSIS — E119 Type 2 diabetes mellitus without complications: Secondary | ICD-10-CM | POA: Insufficient documentation

## 2015-05-02 DIAGNOSIS — I213 ST elevation (STEMI) myocardial infarction of unspecified site: Secondary | ICD-10-CM | POA: Insufficient documentation

## 2015-05-02 DIAGNOSIS — Z87891 Personal history of nicotine dependence: Secondary | ICD-10-CM | POA: Diagnosis not present

## 2015-05-02 DIAGNOSIS — I10 Essential (primary) hypertension: Secondary | ICD-10-CM | POA: Diagnosis not present

## 2015-05-02 DIAGNOSIS — R079 Chest pain, unspecified: Secondary | ICD-10-CM | POA: Diagnosis present

## 2015-05-02 DIAGNOSIS — Z7982 Long term (current) use of aspirin: Secondary | ICD-10-CM | POA: Diagnosis not present

## 2015-05-02 DIAGNOSIS — R109 Unspecified abdominal pain: Secondary | ICD-10-CM | POA: Diagnosis not present

## 2015-05-02 DIAGNOSIS — M109 Gout, unspecified: Secondary | ICD-10-CM | POA: Insufficient documentation

## 2015-05-02 DIAGNOSIS — Z7984 Long term (current) use of oral hypoglycemic drugs: Secondary | ICD-10-CM | POA: Diagnosis not present

## 2015-05-02 DIAGNOSIS — K219 Gastro-esophageal reflux disease without esophagitis: Secondary | ICD-10-CM | POA: Diagnosis not present

## 2015-05-02 DIAGNOSIS — I2510 Atherosclerotic heart disease of native coronary artery without angina pectoris: Secondary | ICD-10-CM | POA: Insufficient documentation

## 2015-05-02 DIAGNOSIS — Z792 Long term (current) use of antibiotics: Secondary | ICD-10-CM | POA: Diagnosis not present

## 2015-05-02 LAB — CBC
HEMATOCRIT: 36.8 % — AB (ref 40.0–52.0)
HEMOGLOBIN: 12.7 g/dL — AB (ref 13.0–18.0)
MCH: 31.2 pg (ref 26.0–34.0)
MCHC: 34.5 g/dL (ref 32.0–36.0)
MCV: 90.6 fL (ref 80.0–100.0)
Platelets: 251 10*3/uL (ref 150–440)
RBC: 4.06 MIL/uL — ABNORMAL LOW (ref 4.40–5.90)
RDW: 13.5 % (ref 11.5–14.5)
WBC: 11.8 10*3/uL — ABNORMAL HIGH (ref 3.8–10.6)

## 2015-05-02 LAB — TROPONIN I
Troponin I: <0.03 ng/mL (ref <0.031)
Troponin I: <0.03 ng/mL (ref <0.031)

## 2015-05-02 LAB — BASIC METABOLIC PANEL
ANION GAP: 7 (ref 5–15)
BUN: 21 mg/dL — ABNORMAL HIGH (ref 6–20)
CO2: 23 mmol/L (ref 22–32)
Calcium: 9 mg/dL (ref 8.9–10.3)
Chloride: 102 mmol/L (ref 101–111)
Creatinine, Ser: 1.25 mg/dL — ABNORMAL HIGH (ref 0.61–1.24)
GFR calc Af Amer: >60 mL/min (ref >60)
GFR, EST NON AFRICAN AMERICAN: 59 mL/min — AB (ref >60)
GLUCOSE: 109 mg/dL — AB (ref 65–99)
POTASSIUM: 3.8 mmol/L (ref 3.5–5.1)
Sodium: 132 mmol/L — ABNORMAL LOW (ref 135–145)

## 2015-05-02 LAB — HEPATIC FUNCTION PANEL
ALBUMIN: 3.2 g/dL — AB (ref 3.5–5.0)
ALK PHOS: 53 U/L (ref 38–126)
ALT: 16 U/L — AB (ref 17–63)
AST: 23 U/L (ref 15–41)
Bilirubin, Direct: <0.1 mg/dL — ABNORMAL LOW (ref 0.1–0.5)
TOTAL PROTEIN: 7.1 g/dL (ref 6.5–8.1)
Total Bilirubin: 0.4 mg/dL (ref 0.3–1.2)

## 2015-05-02 LAB — LIPASE, BLOOD: Lipase: 27 U/L (ref 11–51)

## 2015-05-02 MED ORDER — METOPROLOL SUCCINATE ER 50 MG PO TB24
100.0000 mg | ORAL_TABLET | Freq: Every day | ORAL | Status: DC
  Administered 2015-05-02: 100 mg via ORAL
  Filled 2015-05-02: qty 2

## 2015-05-02 MED ORDER — ISOSORBIDE MONONITRATE ER 60 MG PO TB24
60.0000 mg | ORAL_TABLET | Freq: Every day | ORAL | Status: DC
  Administered 2015-05-02: 60 mg via ORAL
  Filled 2015-05-02: qty 1

## 2015-05-02 MED ORDER — HYDROCHLOROTHIAZIDE 25 MG PO TABS
25.0000 mg | ORAL_TABLET | Freq: Every day | ORAL | Status: DC
  Administered 2015-05-02: 25 mg via ORAL
  Filled 2015-05-02: qty 1

## 2015-05-02 NOTE — Discharge Instructions (Signed)
If you have any chest pain shortness of breath nausea vomiting back pain abdominal pain fever or you feel worse in any way return to the emergency department.

## 2015-05-02 NOTE — ED Provider Notes (Signed)
[facility] Emergency Department Provider Note  ____________________________________________   I have reviewed the triage vital signs and the nursing notes.   HISTORY  Chief Complaint Chest Pain    HPI Casey French is a 65 y.o. male who was recently discharged after a brief hospital stay for diverticulitis and dehydration. He has a history of coronary artery disease. He states that he woke today and had no chest pain or just a sense of warmness and fullness in his stomach really after he woke upand it was about lunchtime. He had not taken his blood pressure meds or any other medication yet today. He did not have any vomiting or nausea cannot have any chest pain or shortness of breath there was no radiation of the symptoms. He states that he burped a few times and started to feel better but he came in to be assessed. Had no exertional symptoms or shortness of breath, the location was in his epigastric reason, it was a "warmth and fullness, it lasted for a few hours, it was mild, no associated symptoms and he denies that it is anything like his prior angina      Past Medical History  Diagnosis Date  . Myocardial infarction ([place])   . Hypertension   . Diabetes mellitus without complication ([place])   . High cholesterol   . Depression   . Gout     Patient Active Problem List   Diagnosis Date Noted  . Angina at rest [facility]) 05/01/2015  . Hypertensive urgency 06/14/2014  . Diverticulitis 06/14/2014  . AKI (acute kidney injury) ([place]) 06/14/2014  . Benign essential HTN 06/14/2014  . Hyperlipidemia 06/14/2014  . DM type 2 (diabetes mellitus, type 2) ([place]) 06/14/2014  . CAD (coronary artery disease) 06/14/2014  . GERD without esophagitis 06/14/2014  . Closed head injury 03/24/2013    Past Surgical History  Procedure Laterality Date  . Cardiac catheterization    . Right hand surgery      3 screws in right hand    Current Outpatient Rx  Name  Route   Sig  Dispense  Refill  . allopurinol (ZYLOPRIM) 100 MG tablet   Oral   Take 100 mg by mouth daily.         Marland Kitchen aspirin EC 81 MG tablet   Oral   Take 81 mg by mouth daily.         Marland Kitchen aspirin-acetaminophen-caffeine (EXCEDRIN MIGRAINE) 250-250-65 MG tablet   Oral   Take 1 tablet by mouth every 6 (six) hours as needed for headache.         . ciprofloxacin (CIPRO) 500 MG tablet   Oral   Take 500 mg by mouth 2 (two) times daily.         . hydrochlorothiazide (HYDRODIURIL) 12.5 MG tablet   Oral   Take 12.5 mg by mouth daily. Pt takes with a 25mg  tablet.         . hydrochlorothiazide (HYDRODIURIL) 25 MG tablet   Oral   Take 25 mg by mouth daily. Pt takes with a 12.5mg  tablet.         . isosorbide mononitrate (IMDUR) 60 MG 24 hr tablet   Oral   Take 60 mg by mouth daily.         Marland Kitchen lisinopril (ZESTRIL) 5 MG tablet   Oral   Take 1 tablet (5 mg total) by mouth daily.   30 tablet   0   . metFORMIN (GLUCOPHAGE-XR) 500 MG 24  hr tablet   Oral   Take 500 mg by mouth 2 (two) times daily.          . metoprolol succinate (TOPROL-XL) 100 MG 24 hr tablet   Oral   Take 100 mg by mouth daily.         . metroNIDAZOLE (FLAGYL) 500 MG tablet   Oral   Take 500 mg by mouth 3 (three) times daily.         . nitroGLYCERIN (NITROSTAT) 0.4 MG SL tablet   Sublingual   Place 0.4 mg under the tongue every 5 (five) minutes as needed for chest pain.         Marland Kitchen omeprazole (PRILOSEC) 20 MG capsule   Oral   Take 20 mg by mouth daily.         . rosuvastatin (CRESTOR) 20 MG tablet   Oral   Take 20 mg by mouth daily.           Allergies Morphine and related  Family History  Problem Relation Age of Onset  . CAD Other     Social History Social History  Substance Use Topics  . Smoking status: Former Smoker    Quit date: 03/24/2001  . Smokeless tobacco: Never Used  . Alcohol Use: No    Review of Systems Constitutional: No fever/chills Eyes: No visual  changes. ENT: No sore throat. No stiff neck no neck pain Cardiovascular: Denies chest pain. Respiratory: Denies shortness of breath. Gastrointestinal:   no vomiting.  No diarrhea.  No constipation. Genitourinary: Negative for dysuria. Musculoskeletal: Negative lower extremity swelling Skin: Negative for rash. Neurological: Negative for headaches, focal weakness or numbness. 10-point ROS otherwise negative.  ____________________________________________   PHYSICAL EXAM:  VITAL SIGNS: ED Triage Vitals  Enc Vitals Group     BP 05/02/15 1730 178/108 mmHg     Pulse Rate 05/02/15 1730 85     Resp 05/02/15 1730 18     Temp 05/02/15 1730 98 F (36.7 C)     Temp Source 05/02/15 1730 Oral     SpO2 05/02/15 1730 100 %     Weight 05/02/15 1730 166 lb (75.297 kg)     Height 05/02/15 1730 5' 9.5" (1.765 m)     Head Cir --      Peak Flow --      Pain Score 05/02/15 1731 5     Pain Loc --      Pain Edu? --      Excl. in [place]? --     Constitutional: Alert and oriented. Well appearing and in no acute distress. Eyes: Conjunctivae are normal. PERRL. EOMI. Head: Atraumatic. Nose: No congestion/rhinnorhea. Mouth/Throat: Mucous membranes are moist.  Oropharynx non-erythematous. Neck: No stridor.   Nontender with no meningismus Cardiovascular: Normal rate, regular rhythm. Grossly normal heart sounds.  Good peripheral circulation. Respiratory: Normal respiratory effort.  No retractions. Lungs CTAB. Abdominal: Soft and nontender. No distention. No guarding no rebound Back:  There is no focal tenderness or step off there is no midline tenderness there are no lesions noted. there is no CVA tenderness Musculoskeletal: No lower extremity tenderness. No joint effusions, no DVT signs strong distal pulses no edema Neurologic:  Normal speech and language. No gross focal neurologic deficits are appreciated.  Skin:  Skin is warm, dry and intact. No rash noted. Psychiatric: Mood and affect are normal.  Speech and behavior are normal.  ____________________________________________   LABS (all labs ordered are listed, but only abnormal results are displayed)  Labs Reviewed  BASIC METABOLIC PANEL - Abnormal; Notable for the following:    Sodium 132 (*)    Glucose, Bld 109 (*)    BUN 21 (*)    Creatinine, Ser 1.25 (*)    GFR calc non Af Amer 59 (*)    All other components within normal limits  CBC - Abnormal; Notable for the following:    WBC 11.8 (*)    RBC 4.06 (*)    Hemoglobin 12.7 (*)    HCT 36.8 (*)    All other components within normal limits  HEPATIC FUNCTION PANEL - Abnormal; Notable for the following:    Albumin 3.2 (*)    ALT 16 (*)    Bilirubin, Direct <0.1 (*)    All other components within normal limits  TROPONIN I  LIPASE, BLOOD  TROPONIN I   ____________________________________________  EKG  I personally interpreted any EKGs ordered by me or triage Normal sinus rhythm at 79 bpm no acute ST elevation or acute ST depression in no acute ischemia noted ____________________________________________  RADIOLOGY  I reviewed any imaging ordered by me or triage that were performed during my shift and, if possible, patient and/or family made aware of any abnormal findings. ____________________________________________   PROCEDURES  Procedure(s) performed: None  Critical Care performed: None  ____________________________________________   INITIAL IMPRESSION / ASSESSMENT AND PLAN / ED COURSE  Pertinent labs & imaging results that were available during my care of the patient were reviewed by me and considered in my medical decision making (see chart for details).  Patient here for sensation of fullness in his stomach after he missed lunch. We did give him food here and he felt 100% better. No time did he have any chest pain or shortness of breath. This is nothing like his prior angina. At this time, there does not appear to be clinical evidence to support the  diagnosis of pulmonary embolus, dissection, myocarditis, endocarditis, pericarditis, pericardial tamponade, acute coronary syndrome, pneumothorax, pneumonia, or any other acute intrathoracic pathology that will require admission or acute intervention. Nor is there evidence of any significant intra-abdominal pathology causing this discomfort.we will send a second cardiac markers that is negative I think he can safely be discharged. Patient's breakup with this plan and eager to go home. He states he would prefer not to be admitted. Abdomen completely benign at this time ____________________________________________   FINAL CLINICAL IMPRESSION(S) / ED DIAGNOSES  Final diagnoses:  None      This chart was dictated using voice recognition software.  Despite best efforts to proofread,  errors can occur which can change meaning.     Schuyler Amor, MD 05/02/15 2133

## 2015-05-02 NOTE — ED Notes (Signed)
Pt. And wife verbalize understanding of discharge and follow-up instructions. Pt. Resting in bed watching TV prior to discharge teaching displaying no signs of distress and reports no concerns or pain, or any distress at this time. Pt. Ambulatory out of the unit with his wife.

## 2015-05-02 NOTE — ED Notes (Signed)
Patient states chest pain started this morning and woke him up.  Recently released from [facility].  Hx of HTN and did not take his medications today. Last dose was yesterday.  Patient reports increased weakness as well.  Pain is 5/10. Patient takes 81mg  ASA but did NOT take it today.  No acute distress at this time.

## 2015-05-02 NOTE — ED Notes (Signed)
Pt. Reports fullness and warm feeling in the chest that woke him up around 1200 today. He stated that he had a loss of appetite the past week. Pt. States empty sick feeling in stomach and fullness in upper abdomen. Last BM reported yesterday. Pt. Has meal tray now. Cardiac symptoms appear WNL.

## 2015-08-18 ENCOUNTER — Emergency Department
Admission: EM | Admit: 2015-08-18 | Discharge: 2015-08-18 | Payer: Medicare Other | Attending: Emergency Medicine | Admitting: Emergency Medicine

## 2015-08-18 ENCOUNTER — Emergency Department: Payer: Medicare Other

## 2015-08-18 ENCOUNTER — Encounter: Payer: Self-pay | Admitting: Emergency Medicine

## 2015-08-18 DIAGNOSIS — Z87891 Personal history of nicotine dependence: Secondary | ICD-10-CM | POA: Diagnosis not present

## 2015-08-18 DIAGNOSIS — Z7982 Long term (current) use of aspirin: Secondary | ICD-10-CM | POA: Insufficient documentation

## 2015-08-18 DIAGNOSIS — E860 Dehydration: Secondary | ICD-10-CM | POA: Diagnosis not present

## 2015-08-18 DIAGNOSIS — E876 Hypokalemia: Secondary | ICD-10-CM | POA: Diagnosis not present

## 2015-08-18 DIAGNOSIS — E119 Type 2 diabetes mellitus without complications: Secondary | ICD-10-CM | POA: Insufficient documentation

## 2015-08-18 DIAGNOSIS — R531 Weakness: Secondary | ICD-10-CM | POA: Diagnosis present

## 2015-08-18 DIAGNOSIS — I10 Essential (primary) hypertension: Secondary | ICD-10-CM | POA: Diagnosis not present

## 2015-08-18 DIAGNOSIS — I200 Unstable angina: Secondary | ICD-10-CM | POA: Diagnosis not present

## 2015-08-18 DIAGNOSIS — Z79899 Other long term (current) drug therapy: Secondary | ICD-10-CM | POA: Diagnosis not present

## 2015-08-18 DIAGNOSIS — Z7984 Long term (current) use of oral hypoglycemic drugs: Secondary | ICD-10-CM | POA: Diagnosis not present

## 2015-08-18 DIAGNOSIS — E871 Hypo-osmolality and hyponatremia: Secondary | ICD-10-CM | POA: Insufficient documentation

## 2015-08-18 DIAGNOSIS — F329 Major depressive disorder, single episode, unspecified: Secondary | ICD-10-CM | POA: Diagnosis not present

## 2015-08-18 DIAGNOSIS — Z5181 Encounter for therapeutic drug level monitoring: Secondary | ICD-10-CM | POA: Insufficient documentation

## 2015-08-18 DIAGNOSIS — I252 Old myocardial infarction: Secondary | ICD-10-CM | POA: Diagnosis not present

## 2015-08-18 LAB — BASIC METABOLIC PANEL
ANION GAP: 15 (ref 5–15)
BUN: 33 mg/dL — ABNORMAL HIGH (ref 6–20)
CHLORIDE: 96 mmol/L — AB (ref 101–111)
CO2: 21 mmol/L — AB (ref 22–32)
Calcium: 9.1 mg/dL (ref 8.9–10.3)
Creatinine, Ser: 1.86 mg/dL — ABNORMAL HIGH (ref 0.61–1.24)
GFR calc non Af Amer: 37 mL/min — ABNORMAL LOW (ref >60)
GFR, EST AFRICAN AMERICAN: 42 mL/min — AB (ref >60)
Glucose, Bld: 142 mg/dL — ABNORMAL HIGH (ref 65–99)
Potassium: 3.2 mmol/L — ABNORMAL LOW (ref 3.5–5.1)
SODIUM: 132 mmol/L — AB (ref 135–145)

## 2015-08-18 LAB — CBC
HCT: 38.3 % — ABNORMAL LOW (ref 40.0–52.0)
HEMOGLOBIN: 13 g/dL (ref 13.0–18.0)
MCH: 30.7 pg (ref 26.0–34.0)
MCHC: 34.1 g/dL (ref 32.0–36.0)
MCV: 90 fL (ref 80.0–100.0)
Platelets: 288 10*3/uL (ref 150–440)
RBC: 4.25 MIL/uL — AB (ref 4.40–5.90)
RDW: 13.8 % (ref 11.5–14.5)
WBC: 14.6 10*3/uL — AB (ref 3.8–10.6)

## 2015-08-18 LAB — PROTIME-INR
INR: 0.97
Prothrombin Time: 13.1 seconds (ref 11.4–15.0)

## 2015-08-18 LAB — APTT: APTT: 37 s — AB (ref 24–36)

## 2015-08-18 LAB — TROPONIN I: TROPONIN I: 0.04 ng/mL — AB (ref <0.03)

## 2015-08-18 MED ORDER — ONDANSETRON 4 MG PO TBDP
4.0000 mg | ORAL_TABLET | Freq: Once | ORAL | Status: AC
  Administered 2015-08-18: 4 mg via ORAL

## 2015-08-18 MED ORDER — POTASSIUM CHLORIDE CRYS ER 20 MEQ PO TBCR
40.0000 meq | EXTENDED_RELEASE_TABLET | Freq: Once | ORAL | Status: AC
  Administered 2015-08-18: 40 meq via ORAL
  Filled 2015-08-18: qty 2

## 2015-08-18 MED ORDER — SODIUM CHLORIDE 0.9 % IV BOLUS (SEPSIS)
1000.0000 mL | Freq: Once | INTRAVENOUS | Status: AC
  Administered 2015-08-18: 1000 mL via INTRAVENOUS

## 2015-08-18 MED ORDER — ONDANSETRON 4 MG PO TBDP
ORAL_TABLET | ORAL | Status: AC
  Administered 2015-08-18: 4 mg via ORAL
  Filled 2015-08-18: qty 1

## 2015-08-18 MED ORDER — HEPARIN SODIUM (PORCINE) 5000 UNIT/ML IJ SOLN: 4000.0000 [IU] | Freq: Once | INTRAMUSCULAR | Status: DC

## 2015-08-18 MED ORDER — NITROGLYCERIN 0.4 MG SL SUBL
0.4000 mg | SUBLINGUAL_TABLET | SUBLINGUAL | Status: DC | PRN
  Administered 2015-08-18: 0.4 mg via SUBLINGUAL
  Filled 2015-08-18: qty 1

## 2015-08-18 MED ORDER — HEPARIN BOLUS VIA INFUSION
4000.0000 [IU] | Freq: Once | INTRAVENOUS | Status: AC
  Administered 2015-08-18: 4000 [IU] via INTRAVENOUS
  Filled 2015-08-18: qty 4000

## 2015-08-18 MED ORDER — HEPARIN (PORCINE) IN NACL 100-0.45 UNIT/ML-% IJ SOLN
900.0000 [IU]/h | Freq: Once | INTRAMUSCULAR | Status: AC
  Administered 2015-08-18: 900 [IU]/h via INTRAVENOUS
  Filled 2015-08-18: qty 250

## 2015-08-18 MED ORDER — ASPIRIN 81 MG PO CHEW
244.0000 mg | CHEWABLE_TABLET | Freq: Once | ORAL | Status: AC
  Administered 2015-08-18: 244 mg via ORAL
  Filled 2015-08-18: qty 3

## 2015-08-18 NOTE — ED Provider Notes (Signed)
[facility] Emergency Department Provider Note  ____________________________________________  Time seen: Approximately 4:40 PM  I have reviewed the triage vital signs and the nursing notes.   HISTORY  Chief Complaint Weakness and Chest Pain    HPI Casey French is a 65 y.o. male w/ a hx of CAD status post stents on low-dose aspirin only, HTN, HL, DM presenting with chest pain. The patient reports that he awoke at noon today with a diffuse central chest tightness associated with shortness of breath, diaphoresis, and nausea without vomiting. The pain did not radiate and is nonpleuritic. This episode lasted 15-20 minutes, but since then he has had 2 additional episodes at rest today. He did take nitroglycerin which seemed to help.The patient states that he regularly has intermittent angina, but this feels worse and similar to his previous heart attack. The patient does note that last week he had several days of severe nausea and vomiting with diarrhea without abdominal pain or fever that have now completely resolved. The patient continues to have chest discomfort at this time.   Past Medical History:  Diagnosis Date  . Depression   . Diabetes mellitus without complication ([place])   . Gout   . High cholesterol   . Hypertension   . Myocardial infarction [facility])     Patient Active Problem List   Diagnosis Date Noted  . Angina at rest [facility]) 05/01/2015  . Hypertensive urgency 06/14/2014  . Diverticulitis 06/14/2014  . AKI (acute kidney injury) ([place]) 06/14/2014  . Benign essential HTN 06/14/2014  . Hyperlipidemia 06/14/2014  . DM type 2 (diabetes mellitus, type 2) ([place]) 06/14/2014  . CAD (coronary artery disease) 06/14/2014  . GERD without esophagitis 06/14/2014  . Closed head injury 03/24/2013    Past Surgical History:  Procedure Laterality Date  . CARDIAC CATHETERIZATION    . right hand surgery     3 screws in right hand    Current Outpatient Rx   . Order #: [id number] Class: Historical Med  . Order #: [id number] Class: Historical Med  . Order #: [id number] Class: Historical Med  . Order #: [id number] Class: Historical Med  . Order #: [id number] Class: Historical Med  . Order #: [id number] Class: Historical Med  . Order #: [id number] Class: Print  . Order #: [id number] Class: Historical Med  . Order #: [id number] Class: Historical Med  . Order #: [id number] Class: Historical Med  . Order #: [id number] Class: Historical Med  . Order #: [id number] Class: Historical Med    Allergies Morphine and related  Family History  Problem Relation Age of Onset  . CAD Other     Social History Social History  Substance Use Topics  . Smoking status: Former Smoker    Quit date: 03/24/2001  . Smokeless tobacco: Never Used  . Alcohol use No    Review of Systems Constitutional: No fever/chills.No lightheadedness or syncope. Positive diaphoresis Eyes: No visual changes. ENT: No sore throat. No congestion or rhinorrhea. Mildly dry mucous membranes. Cardiovascular: Positive central chest tightness. Denies palpitations. Respiratory: Positive shortness of breath.  No cough. Gastrointestinal: No abdominal pain.  Positive nausea, no vomiting.  No diarrhea.  No constipation. Genitourinary: Negative for dysuria. Musculoskeletal: Negative for back pain. Skin: Negative for rash. Neurological: Negative for headaches. No focal numbness, tingling or weakness.   10-point ROS otherwise negative.  ____________________________________________   PHYSICAL EXAM:  VITAL SIGNS: ED Triage Vitals [08/18/15 1509]  Enc Vitals Group     BP 98/72     Pulse Rate (!) 102     Resp  18     Temp 97.9 F (36.6 C)     Temp Source Oral     SpO2 100 %     Weight 164 lb (74.4 kg)     Height 5\' 9"  (1.753 m)     Head Circumference      Peak Flow      Pain Score 8     Pain Loc      Pain Edu?      Excl. in [place]?     Constitutional: Alert and oriented. Well appearing and in no  acute distress. Answers questions appropriately. Eyes: Conjunctivae are normal.  EOMI. No scleral icterus. Head: Atraumatic. Nose: No congestion/rhinnorhea. Mouth/Throat: Mucous membranes are moist.  Neck: No stridor.  Supple.  No JVD. Cardiovascular: Normal rate, regular rhythm. No murmurs, rubs or gallops.  Respiratory: Normal respiratory effort.  No accessory muscle use or retractions. Lungs CTAB.  No wheezes, rales or ronchi. Gastrointestinal: Soft, nontender and nondistended.  No guarding or rebound.  No peritoneal signs. Musculoskeletal: No LE edema. No ttp in the calves or palpable cords.  Negative Homan's sign. Neurologic:  A&Ox3.  Speech is clear.  Face and smile are symmetric.  EOMI.  Moves all extremities well. Skin:  Skin is warm, dry and intact. No rash noted. Psychiatric: Mood and affect are normal. Speech and behavior are normal.  Normal judgement.  ____________________________________________   LABS (all labs ordered are listed, but only abnormal results are displayed)  Labs Reviewed  BASIC METABOLIC PANEL - Abnormal; Notable for the following:       Result Value   Sodium 132 (*)    Potassium 3.2 (*)    Chloride 96 (*)    CO2 21 (*)    Glucose, Bld 142 (*)    BUN 33 (*)    Creatinine, Ser 1.86 (*)    GFR calc non Af Amer 37 (*)    GFR calc Af Amer 42 (*)    All other components within normal limits  CBC - Abnormal; Notable for the following:    WBC 14.6 (*)    RBC 4.25 (*)    HCT 38.3 (*)    All other components within normal limits  TROPONIN I - Abnormal; Notable for the following:    Troponin I 0.04 (*)    All other components within normal limits  APTT - Abnormal; Notable for the following:    aPTT 37 (*)    All other components within normal limits  PROTIME-INR   ____________________________________________  EKG  ED ECG REPORT I, Eula Listen, the attending physician, personally viewed and interpreted this ECG.   Date: 08/18/2015  EKG  Time: 1503  Rate: 105  Rhythm: sinus tachycardia  Axis: Normal  Intervals:Prolonged QTC  ST&T Change: No STEMI  ____________________________________________  RADIOLOGY  Dg Chest 2 View  Result Date: 08/18/2015 CLINICAL DATA:  Pt started having chest pain, weakenss, SHOB today around noon. Took nitro pill AND helped for about 30 min. But symptoms returned. Stent placement 2001, pt was scheduled to see Cardiologist 7/25. EXAM: CHEST  2 VIEW COMPARISON:  05/02/2015 FINDINGS: The cardiac silhouette is normal in size and configuration. Normal mediastinal and hilar contours. Lungs are mildly hyperexpanded but clear. No pleural effusion or pneumothorax. Skeletal structures are intact. IMPRESSION: No active cardiopulmonary disease. Electronically Signed   By: Lajean Manes M.D.   On: 08/18/2015 15:45   ____________________________________________   PROCEDURES  Procedure(s) performed: None  Procedures  Critical Care performed: Yes, see  critical care note(s) ____________________________________________   INITIAL IMPRESSION / ASSESSMENT AND PLAN / ED COURSE  Pertinent labs & imaging results that were available during my care of the patient were reviewed by me and considered in my medical decision making (see chart for details).  65 y.o. male with a history of CAD status post stents, HTN, HL, DM presenting with several episodes of chest pain associated with shortness of breath, diaphoresis and nausea. The patient does not have ischemic changes on his EKG, but he does have a troponin of 0.04 when he has always had negative troponins in the past. I am concerned about unstable angina in this patient and will initiate aspirin and heparin therapy at this time. Other possible causes include PE although the patient does not have a pleuritic pain, history of blood clots, evidence of DVT, hypoxia, or sustained tachycardia. He may also have a gastrointestinal cause including esophageal spasm or GERD for  his pain. I am concerned given his recent GI infection, that he may have become dehydrated as evidenced by his acute on chronic renal insufficiency and mildly dehydrated clinical examination, and that this has put additional strain on his heart.  At this time, [facility] does not have the capacity for admission, and a transfer to [facility] has been initiated.  ____________________________________________  FINAL CLINICAL IMPRESSION(S) / ED DIAGNOSES  Final diagnoses:  Unstable angina (HCC)  Dehydration  Hypokalemia  Hyponatremia    Clinical Course   The patient Remained hemodynamically stable throughout his emergency department stay. He was transfer to [facility] in stable condition. He had received aspirin and heparin in the emergency department.  CRITICAL CARE Performed by: Eula Listen   Total critical care time: 45 minutes  Critical care time was exclusive of separately billable procedures and treating other patients.  Critical care was necessary to treat or prevent imminent or life-threatening deterioration.  Critical care was time spent personally by me on the following activities: development of treatment plan with patient and/or surrogate as well as nursing, discussions with consultants, evaluation of patient's response to treatment, examination of patient, obtaining history from patient or surrogate, ordering and performing treatments and interventions, ordering and review of laboratory studies, ordering and review of radiographic studies, pulse oximetry and re-evaluation of patient's condition.  NEW MEDICATIONS STARTED DURING THIS VISIT:  Discharge Medication List as of 08/18/2015  8:30 PM        Eula Listen, MD 08/19/15 0009

## 2015-08-18 NOTE — ED Notes (Signed)
Pt reports decrease in chest pain in center of chest but reports nitro did not decrease right side pain.

## 2015-08-18 NOTE — ED Triage Notes (Signed)
C/O weakness, chest pains x 1 week.  Seen by PCP last Wednesday.  Continues to have diarrhea, fever.

## 2015-08-18 NOTE — ED Notes (Signed)
Patient transported to X-ray 

## 2015-08-18 NOTE — Progress Notes (Signed)
ANTICOAGULATION CONSULT NOTE - Initial Consult  Pharmacy Consult for heparin drip Indication: chest pain/ACS  Allergies  Allergen Reactions  . Morphine And Related Anxiety   Patient Measurements: Height: 5\' 9"  (175.3 cm) Weight: 164 lb (74.4 kg) IBW/kg (Calculated) : 70.7 Heparin Dosing Weight: 74 kg  Vital Signs: Temp: 97.9 F (36.6 C) (07/24 1509) Temp Source: Oral (07/24 1509) BP: 101/68 (07/24 1645) Pulse Rate: 87 (07/24 1645)  Labs:  Recent Labs  08/18/15 1511  HGB 13.0  HCT 38.3*  PLT 288  APTT 37*  LABPROT 13.1  INR 0.97  CREATININE 1.86*  TROPONINI 0.04*   Estimated Creatinine Clearance: 40.1 mL/min (by C-G formula based on SCr of 1.86 mg/dL).  Medical History: Past Medical History:  Diagnosis Date  . Depression   . Diabetes mellitus without complication ([place])   . Gout   . High cholesterol   . Hypertension   . Myocardial infarction [facility])     Assessment: Pharmacy consulted to dose and monitor heparin drip in this 65 year old male presenting with chest pain. Spoke with RN who confirmed with patient and family that he was not taking any oral anticoagulants prior to admission.   Baseline labs were obtained in ED.  Goal of Therapy:  Heparin level 0.3-0.7 units/ml Monitor platelets by anticoagulation protocol: Yes   Plan:  Give 4000 units bolus x 1 Start heparin infusion at 900 units/hr Check anti-Xa level in 6 hours and daily while on heparin Continue to monitor H&H and platelets  Lenis Noon, PharmD Clinical Pharmacist 08/18/2015,4:59 PM

## 2015-08-21 HISTORY — PX: CORONARY ANGIOPLASTY WITH STENT PLACEMENT: SHX49

## 2015-09-09 DIAGNOSIS — Z9889 Other specified postprocedural states: Secondary | ICD-10-CM | POA: Insufficient documentation

## 2015-12-25 ENCOUNTER — Other Ambulatory Visit: Payer: Self-pay | Admitting: Physical Medicine and Rehabilitation

## 2015-12-25 DIAGNOSIS — M5414 Radiculopathy, thoracic region: Secondary | ICD-10-CM

## 2016-02-23 ENCOUNTER — Ambulatory Visit
Admission: RE | Admit: 2016-02-23 | Discharge: 2016-02-23 | Disposition: A | Discharge status: Home / Self Care | Payer: Medicare Other | Source: Ambulatory Visit | Attending: Physical Medicine and Rehabilitation | Admitting: Physical Medicine and Rehabilitation

## 2016-02-23 DIAGNOSIS — M5414 Radiculopathy, thoracic region: Secondary | ICD-10-CM | POA: Insufficient documentation

## 2016-10-16 ENCOUNTER — Emergency Department: Payer: Medicare Other

## 2016-10-16 ENCOUNTER — Encounter: Payer: Self-pay | Admitting: Emergency Medicine

## 2016-10-16 ENCOUNTER — Emergency Department
Admission: EM | Admit: 2016-10-16 | Discharge: 2016-10-16 | Disposition: A | Discharge status: Home / Self Care | Payer: Medicare Other | Attending: Emergency Medicine | Admitting: Emergency Medicine

## 2016-10-16 DIAGNOSIS — R079 Chest pain, unspecified: Secondary | ICD-10-CM | POA: Diagnosis not present

## 2016-10-16 DIAGNOSIS — Z7901 Long term (current) use of anticoagulants: Secondary | ICD-10-CM | POA: Insufficient documentation

## 2016-10-16 DIAGNOSIS — Z87891 Personal history of nicotine dependence: Secondary | ICD-10-CM | POA: Diagnosis not present

## 2016-10-16 DIAGNOSIS — E119 Type 2 diabetes mellitus without complications: Secondary | ICD-10-CM | POA: Diagnosis not present

## 2016-10-16 DIAGNOSIS — R109 Unspecified abdominal pain: Secondary | ICD-10-CM | POA: Diagnosis present

## 2016-10-16 DIAGNOSIS — K5792 Diverticulitis of intestine, part unspecified, without perforation or abscess without bleeding: Secondary | ICD-10-CM | POA: Diagnosis not present

## 2016-10-16 DIAGNOSIS — Z79899 Other long term (current) drug therapy: Secondary | ICD-10-CM | POA: Diagnosis not present

## 2016-10-16 DIAGNOSIS — I2510 Atherosclerotic heart disease of native coronary artery without angina pectoris: Secondary | ICD-10-CM | POA: Insufficient documentation

## 2016-10-16 DIAGNOSIS — Z7984 Long term (current) use of oral hypoglycemic drugs: Secondary | ICD-10-CM | POA: Diagnosis not present

## 2016-10-16 DIAGNOSIS — I10 Essential (primary) hypertension: Secondary | ICD-10-CM | POA: Insufficient documentation

## 2016-10-16 LAB — URINALYSIS, COMPLETE (UACMP) WITH MICROSCOPIC
BILIRUBIN URINE: NEGATIVE
Bacteria, UA: NONE SEEN
GLUCOSE, UA: 50 mg/dL — AB
Ketones, ur: NEGATIVE mg/dL
LEUKOCYTES UA: NEGATIVE
NITRITE: NEGATIVE
PH: 5 (ref 5.0–8.0)
Protein, ur: >=300 mg/dL — AB
SPECIFIC GRAVITY, URINE: 1.03 (ref 1.005–1.030)

## 2016-10-16 LAB — COMPREHENSIVE METABOLIC PANEL
ALK PHOS: 59 U/L (ref 38–126)
ALT: 19 U/L (ref 17–63)
ANION GAP: 13 (ref 5–15)
AST: 50 U/L — ABNORMAL HIGH (ref 15–41)
Albumin: 3.9 g/dL (ref 3.5–5.0)
BUN: 34 mg/dL — ABNORMAL HIGH (ref 6–20)
CALCIUM: 9.7 mg/dL (ref 8.9–10.3)
CO2: 22 mmol/L (ref 22–32)
CREATININE: 1.66 mg/dL — AB (ref 0.61–1.24)
Chloride: 102 mmol/L (ref 101–111)
GFR calc non Af Amer: 42 mL/min — ABNORMAL LOW (ref >60)
GFR, EST AFRICAN AMERICAN: 48 mL/min — AB (ref >60)
Glucose, Bld: 123 mg/dL — ABNORMAL HIGH (ref 65–99)
Potassium: 3.5 mmol/L (ref 3.5–5.1)
Sodium: 137 mmol/L (ref 135–145)
TOTAL PROTEIN: 7.8 g/dL (ref 6.5–8.1)
Total Bilirubin: 1 mg/dL (ref 0.3–1.2)

## 2016-10-16 LAB — CBC
HCT: 37.5 % — ABNORMAL LOW (ref 40.0–52.0)
Hemoglobin: 12.9 g/dL — ABNORMAL LOW (ref 13.0–18.0)
MCH: 30.9 pg (ref 26.0–34.0)
MCHC: 34.5 g/dL (ref 32.0–36.0)
MCV: 89.7 fL (ref 80.0–100.0)
PLATELETS: 283 10*3/uL (ref 150–440)
RBC: 4.18 MIL/uL — AB (ref 4.40–5.90)
RDW: 14.4 % (ref 11.5–14.5)
WBC: 15.5 10*3/uL — AB (ref 3.8–10.6)

## 2016-10-16 LAB — LIPASE, BLOOD: Lipase: 43 U/L (ref 11–51)

## 2016-10-16 LAB — TROPONIN I: Troponin I: 0.04 ng/mL (ref <0.03)

## 2016-10-16 MED ORDER — METRONIDAZOLE 500 MG PO TABS: 500.0000 mg | ORAL_TABLET | Freq: Three times a day (TID) | ORAL | 0 refills | Status: AC

## 2016-10-16 MED ORDER — IOPAMIDOL (ISOVUE-300) INJECTION 61%
75.0000 mL | Freq: Once | INTRAVENOUS | Status: AC | PRN
  Administered 2016-10-16: 75 mL via INTRAVENOUS

## 2016-10-16 MED ORDER — METRONIDAZOLE 500 MG PO TABS
500.0000 mg | ORAL_TABLET | Freq: Once | ORAL | Status: AC
  Administered 2016-10-16: 500 mg via ORAL
  Filled 2016-10-16: qty 1

## 2016-10-16 MED ORDER — ONDANSETRON HCL 4 MG/2ML IJ SOLN
4.0000 mg | Freq: Once | INTRAMUSCULAR | Status: AC
  Administered 2016-10-16: 4 mg via INTRAVENOUS
  Filled 2016-10-16: qty 2

## 2016-10-16 MED ORDER — OXYCODONE-ACETAMINOPHEN 5-325 MG PO TABS: 1.0000 | ORAL_TABLET | Freq: Four times a day (QID) | ORAL | 0 refills | Status: DC | PRN

## 2016-10-16 MED ORDER — ONDANSETRON 4 MG PO TBDP: 4.0000 mg | ORAL_TABLET | Freq: Three times a day (TID) | ORAL | 0 refills | Status: DC | PRN

## 2016-10-16 MED ORDER — SODIUM CHLORIDE 0.9 % IV BOLUS (SEPSIS)
1000.0000 mL | Freq: Once | INTRAVENOUS | Status: AC
  Administered 2016-10-16: 1000 mL via INTRAVENOUS

## 2016-10-16 MED ORDER — MORPHINE SULFATE (PF) 4 MG/ML IV SOLN
4.0000 mg | Freq: Once | INTRAVENOUS | Status: AC
  Administered 2016-10-16: 4 mg via INTRAVENOUS
  Filled 2016-10-16: qty 1

## 2016-10-16 MED ORDER — ONDANSETRON 4 MG PO TBDP
4.0000 mg | ORAL_TABLET | Freq: Once | ORAL | Status: AC | PRN
  Administered 2016-10-16: 4 mg via ORAL
  Filled 2016-10-16: qty 1

## 2016-10-16 MED ORDER — AMOXICILLIN-POT CLAVULANATE 875-125 MG PO TABS: 1.0000 | ORAL_TABLET | Freq: Two times a day (BID) | ORAL | 0 refills | Status: AC

## 2016-10-16 MED ORDER — AMOXICILLIN-POT CLAVULANATE 875-125 MG PO TABS
1.0000 | ORAL_TABLET | Freq: Once | ORAL | Status: AC
  Administered 2016-10-16: 1 via ORAL
  Filled 2016-10-16: qty 1

## 2016-10-16 NOTE — ED Triage Notes (Signed)
Pt c/o generalized abdominal pain and nausea on and off since Wednesday.  Has chest pain when starts dry heaving.  Did have vomiting early per wife but now mostly dry heaves. No diarrhea.  No known fevers.  Describes chest pain as tight feeling.

## 2016-10-16 NOTE — ED Provider Notes (Signed)
[facility] Emergency Department Provider Note  ____________________________________________   First MD Initiated Contact with Patient 10/16/16 1502     (approximate)  I have reviewed the triage vital signs and the nursing notes.   HISTORY  Chief Complaint Chest Pain; Nausea; and Abdominal Pain    HPI Casey French is a 66 y.o. male comes to the emergency department with 4 days of abdominal pain. He says "I think my diverticulitis is flaring up again". He's had 3 episodes of diverticulitis in the past. Beginning on Wednesday his pain is been diffuse throughout his abdomen moderate severity constant. He has had multiple episodes of diarrhea which has subsequently passed. He's had no fevers or chills. He feels more than pain a significant amount of nausea. He is able to eat and drink although with difficulty. He has no history of abdominal surgeries.   Past Medical History:  Diagnosis Date  . Depression   . Diabetes mellitus without complication ([place])   . Gout   . High cholesterol   . Hypertension   . Myocardial infarction [facility])     Patient Active Problem List   Diagnosis Date Noted  . Angina at rest [facility]) 05/01/2015  . Hypertensive urgency 06/14/2014  . Diverticulitis 06/14/2014  . AKI (acute kidney injury) ([place]) 06/14/2014  . Benign essential HTN 06/14/2014  . Hyperlipidemia 06/14/2014  . DM type 2 (diabetes mellitus, type 2) ([place]) 06/14/2014  . CAD (coronary artery disease) 06/14/2014  . GERD without esophagitis 06/14/2014  . Closed head injury 03/24/2013    Past Surgical History:  Procedure Laterality Date  . CARDIAC CATHETERIZATION    . right hand surgery     3 screws in right hand    Prior to Admission medications   Medication Sig Start Date End Date Taking? Authorizing Provider  allopurinol (ZYLOPRIM) 100 MG tablet Take 100 mg by mouth daily.   Yes [provider]  aspirin EC 81 MG tablet Take 81 mg by mouth daily.    Yes [provider]  aspirin-acetaminophen-caffeine (EXCEDRIN MIGRAINE) [phone] MG tablet Take 1 tablet by mouth every 6 (six) hours as needed for headache.   Yes [provider]  ELIQUIS 5 MG TABS tablet Take 5 mg by mouth daily. 08/16/16  Yes [provider]  gabapentin (NEURONTIN) 300 MG capsule Take 300 mg by mouth at bedtime. 09/25/16  Yes [provider]  hydrochlorothiazide (HYDRODIURIL) 12.5 MG tablet Take 37.5 mg by mouth daily.    Yes [provider]  isosorbide mononitrate (IMDUR) 60 MG 24 hr tablet Take 60 mg by mouth daily.   Yes [provider]  lisinopril (ZESTRIL) 5 MG tablet Take 1 tablet (5 mg total) by mouth daily. Patient taking differently: Take 10 mg by mouth daily.  06/16/14  Yes Gouru, Illene Silver, MD  metFORMIN (GLUCOPHAGE-XR) 500 MG 24 hr tablet Take 500 mg by mouth 2 (two) times daily.    Yes [provider]  metoprolol succinate (TOPROL-XL) 100 MG 24 hr tablet Take 100 mg by mouth daily.   Yes [provider]  nitroGLYCERIN (NITROSTAT) 0.4 MG SL tablet Place 0.4 mg under the tongue every 5 (five) minutes as needed for chest pain.   Yes [provider]  omeprazole (PRILOSEC) 20 MG capsule Take 20 mg by mouth daily.   Yes [provider]  rosuvastatin (CRESTOR) 20 MG tablet Take 20 mg by mouth daily.   Yes [provider]  amoxicillin-clavulanate (AUGMENTIN) 875-125 MG tablet Take  1 tablet by mouth 2 (two) times daily. 10/16/16 10/30/16  Darel Hong, MD  metroNIDAZOLE (FLAGYL) 500 MG tablet Take 1 tablet (500 mg total) by mouth 3 (three) times daily. 10/16/16 10/30/16  Darel Hong, MD  ondansetron (ZOFRAN ODT) 4 MG disintegrating tablet Take 1 tablet (4 mg total) by mouth every 8 (eight) hours as needed for nausea or vomiting. 10/16/16   Darel Hong, MD  oxyCODONE-acetaminophen (ROXICET) 5-325 MG tablet Take 1 tablet by mouth every 6 (six) hours as needed for severe pain.  10/16/16   Darel Hong, MD    Allergies Morphine and related  Family History  Problem Relation Age of Onset  . CAD Other     Social History Social History  Substance Use Topics  . Smoking status: Former Smoker    Quit date: 03/24/2001  . Smokeless tobacco: Never Used  . Alcohol use No    Review of Systems Constitutional: No fever/chills Eyes: No visual changes. ENT: No sore throat. Cardiovascular: Denies chest pain. Respiratory: Denies shortness of breath. Gastrointestinal: positive abdominal pain.  Positive nausea, no vomiting.  Positive diarrhea.  No constipation. Genitourinary: Negative for dysuria. Musculoskeletal: Negative for back pain. Skin: Negative for rash. Neurological: Negative for headaches, focal weakness or numbness.   ____________________________________________   PHYSICAL EXAM:  VITAL SIGNS: ED Triage Vitals  Enc Vitals Group     BP 10/16/16 1210 140/83     Pulse Rate 10/16/16 1210 (!) 104     Resp 10/16/16 1210 18     Temp 10/16/16 1210 98 F (36.7 C)     Temp Source 10/16/16 1210 Oral     SpO2 10/16/16 1210 99 %     Weight 10/16/16 1210 164 lb (74.4 kg)     Height 10/16/16 1210 5\' 9"  (1.753 m)     Head Circumference --      Peak Flow --      Pain Score 10/16/16 1209 10     Pain Loc --      Pain Edu? --      Excl. in [place]? --     Constitutional: alert and oriented 4 appears somewhat uncomfortable nontoxic no diaphoresis speaks full clear sentences Eyes: PERRL EOMI. Head: Atraumatic. Nose: No congestion/rhinnorhea. Mouth/Throat: No trismus Neck: No stridor.   Cardiovascular: tachycardic rate, regular rhythm. Grossly normal heart sounds.  Good peripheral circulation. Respiratory: Normal respiratory effort.  No retractions. Lungs CTAB and moving good air Gastrointestinal: soft abdomen and no frank peritonitis mild diffuse tenderness with no focality no McBurney's tenderness negative Rovsing's Musculoskeletal: No lower extremity edema    Neurologic:  Normal speech and language. No gross focal neurologic deficits are appreciated. Skin:  Skin is warm, dry and intact. No rash noted. Psychiatric: Mood and affect are normal. Speech and behavior are normal.    ____________________________________________   DIFFERENTIAL includes but not limited to  diverticulitis, abscess, fistula, appendicitis, UTI, pyelonephritis ____________________________________________   LABS (all labs ordered are listed, but only abnormal results are displayed)  Labs Reviewed  COMPREHENSIVE METABOLIC PANEL - Abnormal; Notable for the following:       Result Value   Glucose, Bld 123 (*)    BUN 34 (*)    Creatinine, Ser 1.66 (*)    AST 50 (*)    GFR calc non Af Amer 42 (*)    GFR calc Af Amer 48 (*)    All other components within normal limits  CBC - Abnormal; Notable for the following:    WBC 15.5 (*)  RBC 4.18 (*)    Hemoglobin 12.9 (*)    HCT 37.5 (*)    All other components within normal limits  URINALYSIS, COMPLETE (UACMP) WITH MICROSCOPIC - Abnormal; Notable for the following:    Color, Urine YELLOW (*)    APPearance CLEAR (*)    Glucose, UA 50 (*)    Hgb urine dipstick MODERATE (*)    Protein, ur >=300 (*)    Squamous Epithelial / LPF 0-5 (*)    All other components within normal limits  TROPONIN I - Abnormal; Notable for the following:    Troponin I 0.04 (*)    All other components within normal limits  LIPASE, BLOOD    blood work interpreted by me: Elevated white count to suggest infection. Urinalysis with white blood cells but no bacteria. This is suggestive of a reactive process in the abdomen more than a urinary tract infection. Slightly elevated troponin however the patient has chronic kidney disease and this is nonspecific __________________________________________  EKG  ED ECG REPORT I, Darel Hong, the attending physician, personally viewed and interpreted this ECG.  Date: 10/16/2016 EKG Time:  Rate:  105 Rhythm: sinus tachycardia QRS Axis: normal Intervals: normal ST/T Wave abnormalities: normal Narrative Interpretation: no evidence of acute ischemia  ____________________________________________  RADIOLOGY  CT scan abdomen pelvis reviewed by me shows mild diverticulitis with no abscess ____________________________________________   PROCEDURES  Procedure(s) performed: no  Procedures  Critical Care performed: o  Observation: no ____________________________________________   INITIAL IMPRESSION / ASSESSMENT AND PLAN / ED COURSE  Pertinent labs & imaging results that were available during my care of the patient were reviewed by me and considered in my medical decision making (see chart for details).  the patient arrives tachycardic and uncomfortable appearing. His abdomen is diffusely tender. He has an elevated white count and white blood cells in his urine. His history is concerning for an intra-abdominal infection most likely diverticulitis. At this point he requires a CT scan with IV contrast. Morphine and Zofran as well as fluids and he is nothing by mouth.     ----------------------------------------- 4:26 PM on 10/16/2016 -----------------------------------------  The patient remains hemodynamically stable and his pain is adequately controlled. CT scan shows likely mild diverticulitis with no evidence of surgical complication. He is able to eat and drink. At this point I'm comfortable discharging him home with 2 day abdominal recheck and Augmentin and Flagyl for 2 weeks. The patient verbalizes understanding and agreement with the plan. ____________________________________________   FINAL CLINICAL IMPRESSION(S) / ED DIAGNOSES  Final diagnoses:  Diverticulitis      NEW MEDICATIONS STARTED DURING THIS VISIT:  New Prescriptions   AMOXICILLIN-CLAVULANATE (AUGMENTIN) 875-125 MG TABLET    Take 1 tablet by mouth 2 (two) times daily.   METRONIDAZOLE (FLAGYL) 500 MG  TABLET    Take 1 tablet (500 mg total) by mouth 3 (three) times daily.   ONDANSETRON (ZOFRAN ODT) 4 MG DISINTEGRATING TABLET    Take 1 tablet (4 mg total) by mouth every 8 (eight) hours as needed for nausea or vomiting.   OXYCODONE-ACETAMINOPHEN (ROXICET) 5-325 MG TABLET    Take 1 tablet by mouth every 6 (six) hours as needed for severe pain.     Note:  This document was prepared using Dragon voice recognition software and may include unintentional dictation errors.     Darel Hong, MD 10/16/16 1626

## 2016-10-16 NOTE — Discharge Instructions (Signed)
Fortunately today your diverticulitis is very mild. Please take all of your antibiotics as prescribed and make sure you follow up with a physician this coming Monday for a recheck of your stomach. This can be your primary care doctor, an urgent care doctor, or of course we are more than happy to see you in our emergency department. Return to the emergency department sooner for any new or worsening symptoms such as worsening pain, fevers, if you cannot eat or drink, or for any other concerns whatsoever.  It was a pleasure to take care of you today, and thank you for coming to our emergency department.  If you have any questions or concerns before leaving please ask the nurse to grab me and I'm more than happy to go through your aftercare instructions again.  If you were prescribed any opioid pain medication today such as Norco, Vicodin, Percocet, morphine, hydrocodone, or oxycodone please make sure you do not drive when you are taking this medication as it can alter your ability to drive safely.  If you have any concerns once you are home that you are not improving or are in fact getting worse before you can make it to your follow-up appointment, please do not hesitate to call 911 and come back for further evaluation.  Darel Hong, MD  Results for orders placed or performed during the hospital encounter of 10/16/16  Lipase, blood  Result Value Ref Range   Lipase 43 11 - 51 U/L  Comprehensive metabolic panel  Result Value Ref Range   Sodium 137 135 - 145 mmol/L   Potassium 3.5 3.5 - 5.1 mmol/L   Chloride 102 101 - 111 mmol/L   CO2 22 22 - 32 mmol/L   Glucose, Bld 123 (H) 65 - 99 mg/dL   BUN 34 (H) 6 - 20 mg/dL   Creatinine, Ser 1.66 (H) 0.61 - 1.24 mg/dL   Calcium 9.7 8.9 - 10.3 mg/dL   Total Protein 7.8 6.5 - 8.1 g/dL   Albumin 3.9 3.5 - 5.0 g/dL   AST 50 (H) 15 - 41 U/L   ALT 19 17 - 63 U/L   Alkaline Phosphatase 59 38 - 126 U/L   Total Bilirubin 1.0 0.3 - 1.2 mg/dL   GFR calc non Af  Amer 42 (L) >60 mL/min   GFR calc Af Amer 48 (L) >60 mL/min   Anion gap 13 5 - 15  CBC  Result Value Ref Range   WBC 15.5 (H) 3.8 - 10.6 K/uL   RBC 4.18 (L) 4.40 - 5.90 MIL/uL   Hemoglobin 12.9 (L) 13.0 - 18.0 g/dL   HCT 37.5 (L) 40.0 - 52.0 %   MCV 89.7 80.0 - 100.0 fL   MCH 30.9 26.0 - 34.0 pg   MCHC 34.5 32.0 - 36.0 g/dL   RDW 14.4 11.5 - 14.5 %   Platelets 283 150 - 440 K/uL  Urinalysis, Complete w Microscopic  Result Value Ref Range   Color, Urine YELLOW (A) YELLOW   APPearance CLEAR (A) CLEAR   Specific Gravity, Urine 1.030 1.005 - 1.030   pH 5.0 5.0 - 8.0   Glucose, UA 50 (A) NEGATIVE mg/dL   Hgb urine dipstick MODERATE (A) NEGATIVE   Bilirubin Urine NEGATIVE NEGATIVE   Ketones, ur NEGATIVE NEGATIVE mg/dL   Protein, ur >=300 (A) NEGATIVE mg/dL   Nitrite NEGATIVE NEGATIVE   Leukocytes, UA NEGATIVE NEGATIVE   RBC / HPF 0-5 0 - 5 RBC/hpf   WBC, UA 6-30 0 -  5 WBC/hpf   Bacteria, UA NONE SEEN NONE SEEN   Squamous Epithelial / LPF 0-5 (A) NONE SEEN   Mucus PRESENT    Hyaline Casts, UA PRESENT   Troponin I  Result Value Ref Range   Troponin I 0.04 (HH) <0.03 ng/mL   Dg Chest 2 View  Result Date: 10/16/2016 CLINICAL DATA:  Chest pain EXAM: CHEST  2 VIEW COMPARISON:  08/18/2015 FINDINGS: Cardiac shadow is stable. The lungs are hyper aerated bilaterally. No focal infiltrate or sizable effusion is seen. Minimal scarring is noted in the right base. No bony abnormality is noted. IMPRESSION: COPD without acute abnormality. Electronically Signed   By: Inez Catalina M.D.   On: 10/16/2016 13:47   Ct Abdomen Pelvis W Contrast  Result Date: 10/16/2016 CLINICAL DATA:  66 year old male with abdominal pain. Possible diverticulitis. EXAM: CT ABDOMEN AND PELVIS WITH CONTRAST TECHNIQUE: Multidetector CT imaging of the abdomen and pelvis was performed using the standard protocol following bolus administration of intravenous contrast. CONTRAST:  44mL ISOVUE-300 IOPAMIDOL (ISOVUE-300) INJECTION  61% COMPARISON:  06/13/2014 and earlier. FINDINGS: Lower chest: Chronic elevation of the right hemidiaphragm. Negative lung bases. No pericardial or pleural effusion. Hepatobiliary: Negative liver and gallbladder. Pancreas: Negative. Spleen: Negative. Adrenals/Urinary Tract: Normal adrenal glands. Bilateral renal enhancement and contrast excretion appears normal. Negative course of both ureters. Diminutive and unremarkable urinary bladder. Stomach/Bowel: Negative rectum. Diverticulosis throughout the sigmoid colon and continuing into the mid descending colon. The affected segment of large bowel has an indistinct appearance of the wall throughout, but there is no active mesenteric inflammation. Negative splenic flexure. Mild to moderate diverticulosis from the hepatic flexure to the cecum. Similar indistinct appearance of the bowel wall with no mesenteric stranding. Normal appendix (series 2, image 63). Negative terminal ileum. Chronic dystrophic calcification in the right pericolic gutter. Intermittent fluid-filled but no suspiciously dilated small bowel loops. Negative stomach and duodenum; I note that although the duodenum crosses midline to the left the proximal small bowel loops are mostly in the right upper quadrant. Mild swirling of the mesentery in the region is stable. No abdominal free fluid. Vascular/Lymphatic: Aortoiliac calcified atherosclerosis. Major arterial structures in the abdomen and pelvis are patent. Portal venous system is patent. New line no lymphadenopathy. Reproductive: Negative. Other: No pelvic free fluid. Musculoskeletal: Chronic right L5 pars fracture. No significant spondylolisthesis. No acute osseous abnormality identified. IMPRESSION: 1. Diverticulosis in the distal descending and sigmoid colon, and also in the right colon. Mild diverticulitis is difficult to exclude, but there is no mesenteric inflammation. 2. Normal appendix, and otherwise no acute or inflammatory process in the  abdomen or pelvis. 3.  Aortic Atherosclerosis (ICD10-I70.0). Electronically Signed   By: Genevie Ann M.D.   On: 10/16/2016 16:13

## 2016-11-10 ENCOUNTER — Inpatient Hospital Stay
Admission: EM | Admit: 2016-11-10 | Discharge: 2016-11-11 | DRG: 303 | Disposition: A | Discharge status: Home / Self Care | Payer: Medicare Other | Attending: Internal Medicine | Admitting: Internal Medicine

## 2016-11-10 ENCOUNTER — Encounter: Payer: Self-pay | Admitting: Emergency Medicine

## 2016-11-10 ENCOUNTER — Emergency Department: Payer: Medicare Other

## 2016-11-10 ENCOUNTER — Other Ambulatory Visit: Payer: Self-pay

## 2016-11-10 DIAGNOSIS — Z7984 Long term (current) use of oral hypoglycemic drugs: Secondary | ICD-10-CM

## 2016-11-10 DIAGNOSIS — Z23 Encounter for immunization: Secondary | ICD-10-CM | POA: Diagnosis not present

## 2016-11-10 DIAGNOSIS — I480 Paroxysmal atrial fibrillation: Secondary | ICD-10-CM | POA: Diagnosis not present

## 2016-11-10 DIAGNOSIS — Z7982 Long term (current) use of aspirin: Secondary | ICD-10-CM | POA: Diagnosis not present

## 2016-11-10 DIAGNOSIS — I25110 Atherosclerotic heart disease of native coronary artery with unstable angina pectoris: Principal | ICD-10-CM | POA: Diagnosis present

## 2016-11-10 DIAGNOSIS — I200 Unstable angina: Secondary | ICD-10-CM | POA: Diagnosis present

## 2016-11-10 DIAGNOSIS — Z955 Presence of coronary angioplasty implant and graft: Secondary | ICD-10-CM

## 2016-11-10 DIAGNOSIS — Z7901 Long term (current) use of anticoagulants: Secondary | ICD-10-CM | POA: Diagnosis not present

## 2016-11-10 DIAGNOSIS — E119 Type 2 diabetes mellitus without complications: Secondary | ICD-10-CM | POA: Diagnosis not present

## 2016-11-10 DIAGNOSIS — Z79899 Other long term (current) drug therapy: Secondary | ICD-10-CM

## 2016-11-10 DIAGNOSIS — I10 Essential (primary) hypertension: Secondary | ICD-10-CM | POA: Diagnosis present

## 2016-11-10 DIAGNOSIS — E785 Hyperlipidemia, unspecified: Secondary | ICD-10-CM | POA: Diagnosis not present

## 2016-11-10 DIAGNOSIS — I252 Old myocardial infarction: Secondary | ICD-10-CM

## 2016-11-10 DIAGNOSIS — Z87891 Personal history of nicotine dependence: Secondary | ICD-10-CM

## 2016-11-10 DIAGNOSIS — Z885 Allergy status to narcotic agent status: Secondary | ICD-10-CM | POA: Diagnosis not present

## 2016-11-10 DIAGNOSIS — M109 Gout, unspecified: Secondary | ICD-10-CM | POA: Diagnosis present

## 2016-11-10 LAB — BASIC METABOLIC PANEL
Anion gap: 14 (ref 5–15)
BUN: 18 mg/dL (ref 6–20)
CALCIUM: 9.3 mg/dL (ref 8.9–10.3)
CHLORIDE: 102 mmol/L (ref 101–111)
CO2: 19 mmol/L — ABNORMAL LOW (ref 22–32)
CREATININE: 1.48 mg/dL — AB (ref 0.61–1.24)
GFR calc non Af Amer: 48 mL/min — ABNORMAL LOW (ref >60)
GFR, EST AFRICAN AMERICAN: 56 mL/min — AB (ref >60)
Glucose, Bld: 187 mg/dL — ABNORMAL HIGH (ref 65–99)
Potassium: 3.5 mmol/L (ref 3.5–5.1)
SODIUM: 135 mmol/L (ref 135–145)

## 2016-11-10 LAB — CBC
HCT: 30.9 % — ABNORMAL LOW (ref 40.0–52.0)
Hemoglobin: 10.6 g/dL — ABNORMAL LOW (ref 13.0–18.0)
MCH: 31.8 pg (ref 26.0–34.0)
MCHC: 34.5 g/dL (ref 32.0–36.0)
MCV: 92.3 fL (ref 80.0–100.0)
PLATELETS: 221 10*3/uL (ref 150–440)
RBC: 3.35 MIL/uL — AB (ref 4.40–5.90)
RDW: 14.2 % (ref 11.5–14.5)
WBC: 9.2 10*3/uL (ref 3.8–10.6)

## 2016-11-10 LAB — LIPID PANEL
Cholesterol: 142 mg/dL (ref 0–200)
HDL: 45 mg/dL (ref >40)
LDL Cholesterol: 75 mg/dL (ref 0–99)
Total CHOL/HDL Ratio: 3.2 RATIO
Triglycerides: 109 mg/dL (ref <150)
VLDL: 22 mg/dL (ref 0–40)

## 2016-11-10 LAB — APTT: aPTT: 42 seconds — ABNORMAL HIGH (ref 24–36)

## 2016-11-10 LAB — HEPARIN LEVEL (UNFRACTIONATED): HEPARIN UNFRACTIONATED: 0.72 [IU]/mL — AB (ref 0.30–0.70)

## 2016-11-10 LAB — GLUCOSE, CAPILLARY
GLUCOSE-CAPILLARY: 129 mg/dL — AB (ref 65–99)
Glucose-Capillary: 106 mg/dL — ABNORMAL HIGH (ref 65–99)

## 2016-11-10 LAB — PROTIME-INR
INR: 1.14
PROTHROMBIN TIME: 14.5 s (ref 11.4–15.2)

## 2016-11-10 LAB — TROPONIN I

## 2016-11-10 MED ORDER — ONDANSETRON HCL 4 MG PO TABS: 4.0000 mg | ORAL_TABLET | Freq: Four times a day (QID) | ORAL | Status: DC | PRN

## 2016-11-10 MED ORDER — ROSUVASTATIN CALCIUM 20 MG PO TABS
20.0000 mg | ORAL_TABLET | Freq: Every day | ORAL | Status: DC
  Administered 2016-11-10 – 2016-11-11 (×2): 20 mg via ORAL
  Filled 2016-11-10: qty 1
  Filled 2016-11-10 (×2): qty 2
  Filled 2016-11-10: qty 1

## 2016-11-10 MED ORDER — INFLUENZA VAC SPLIT HIGH-DOSE 0.5 ML IM SUSY
0.5000 mL | PREFILLED_SYRINGE | INTRAMUSCULAR | Status: AC
  Administered 2016-11-11: 0.5 mL via INTRAMUSCULAR
  Filled 2016-11-10: qty 0.5

## 2016-11-10 MED ORDER — PANTOPRAZOLE SODIUM 40 MG PO TBEC
40.0000 mg | DELAYED_RELEASE_TABLET | Freq: Every day | ORAL | Status: DC
  Administered 2016-11-11: 40 mg via ORAL
  Filled 2016-11-10: qty 1

## 2016-11-10 MED ORDER — ASPIRIN 81 MG PO CHEW
324.0000 mg | CHEWABLE_TABLET | Freq: Once | ORAL | Status: AC
  Administered 2016-11-10: 324 mg via ORAL
  Filled 2016-11-10: qty 4

## 2016-11-10 MED ORDER — ASPIRIN EC 81 MG PO TBEC
81.0000 mg | DELAYED_RELEASE_TABLET | Freq: Every day | ORAL | Status: DC
  Administered 2016-11-11: 81 mg via ORAL
  Filled 2016-11-10: qty 1

## 2016-11-10 MED ORDER — PNEUMOCOCCAL VAC POLYVALENT 25 MCG/0.5ML IJ INJ
0.5000 mL | INJECTION | INTRAMUSCULAR | Status: AC
  Administered 2016-11-11: 0.5 mL via INTRAMUSCULAR
  Filled 2016-11-10: qty 0.5

## 2016-11-10 MED ORDER — ONDANSETRON HCL 4 MG/2ML IJ SOLN: 4.0000 mg | Freq: Four times a day (QID) | INTRAMUSCULAR | Status: DC | PRN

## 2016-11-10 MED ORDER — NITROGLYCERIN IN D5W 200-5 MCG/ML-% IV SOLN
0.0000 ug/min | INTRAVENOUS | Status: DC
  Administered 2016-11-10: 5 ug/min via INTRAVENOUS

## 2016-11-10 MED ORDER — ALLOPURINOL 100 MG PO TABS
100.0000 mg | ORAL_TABLET | Freq: Every day | ORAL | Status: DC
  Administered 2016-11-11: 100 mg via ORAL
  Filled 2016-11-10 (×2): qty 1

## 2016-11-10 MED ORDER — METOPROLOL SUCCINATE ER 50 MG PO TB24
100.0000 mg | ORAL_TABLET | Freq: Every day | ORAL | Status: DC
  Administered 2016-11-10 – 2016-11-11 (×2): 100 mg via ORAL
  Filled 2016-11-10 (×2): qty 2

## 2016-11-10 MED ORDER — GABAPENTIN 300 MG PO CAPS
300.0000 mg | ORAL_CAPSULE | Freq: Every day | ORAL | Status: DC
  Administered 2016-11-10: 300 mg via ORAL
  Filled 2016-11-10: qty 1

## 2016-11-10 MED ORDER — NITROGLYCERIN IN D5W 200-5 MCG/ML-% IV SOLN
INTRAVENOUS | Status: AC
  Filled 2016-11-10: qty 250

## 2016-11-10 MED ORDER — HEPARIN BOLUS VIA INFUSION
4000.0000 [IU] | Freq: Once | INTRAVENOUS | Status: AC
  Administered 2016-11-10: 4000 [IU] via INTRAVENOUS
  Filled 2016-11-10: qty 4000

## 2016-11-10 MED ORDER — HYDROCHLOROTHIAZIDE 25 MG PO TABS
37.5000 mg | ORAL_TABLET | Freq: Every day | ORAL | Status: DC
  Administered 2016-11-10 – 2016-11-11 (×2): 37.5 mg via ORAL
  Filled 2016-11-10 (×2): qty 2

## 2016-11-10 MED ORDER — INSULIN ASPART 100 UNIT/ML ~~LOC~~ SOLN
0.0000 [IU] | Freq: Three times a day (TID) | SUBCUTANEOUS | Status: DC
  Administered 2016-11-11: 1 [IU] via SUBCUTANEOUS
  Filled 2016-11-10: qty 1

## 2016-11-10 MED ORDER — LISINOPRIL 10 MG PO TABS
10.0000 mg | ORAL_TABLET | Freq: Every day | ORAL | Status: DC
  Administered 2016-11-10 – 2016-11-11 (×2): 10 mg via ORAL
  Filled 2016-11-10 (×2): qty 1

## 2016-11-10 MED ORDER — NITROGLYCERIN IN D5W 200-5 MCG/ML-% IV SOLN: 0.0000 ug/min | Freq: Once | INTRAVENOUS | Status: AC

## 2016-11-10 MED ORDER — ACETAMINOPHEN 650 MG RE SUPP: 650.0000 mg | Freq: Four times a day (QID) | RECTAL | Status: DC | PRN

## 2016-11-10 MED ORDER — NITROGLYCERIN 0.4 MG SL SUBL
0.4000 mg | SUBLINGUAL_TABLET | SUBLINGUAL | Status: DC | PRN
  Administered 2016-11-10: 0.4 mg via SUBLINGUAL
  Filled 2016-11-10 (×2): qty 1

## 2016-11-10 MED ORDER — SODIUM CHLORIDE 0.9% FLUSH: 3.0000 mL | Freq: Two times a day (BID) | INTRAVENOUS | Status: DC

## 2016-11-10 MED ORDER — ISOSORBIDE MONONITRATE ER 60 MG PO TB24
60.0000 mg | ORAL_TABLET | Freq: Every day | ORAL | Status: DC
  Administered 2016-11-11: 60 mg via ORAL
  Filled 2016-11-10: qty 1

## 2016-11-10 MED ORDER — ASPIRIN-ACETAMINOPHEN-CAFFEINE 250-250-65 MG PO TABS
1.0000 | ORAL_TABLET | Freq: Four times a day (QID) | ORAL | Status: DC | PRN
  Filled 2016-11-10: qty 1

## 2016-11-10 MED ORDER — ACETAMINOPHEN 325 MG PO TABS
650.0000 mg | ORAL_TABLET | Freq: Four times a day (QID) | ORAL | Status: DC | PRN
  Administered 2016-11-11: 650 mg via ORAL
  Filled 2016-11-10: qty 2

## 2016-11-10 MED ORDER — HEPARIN (PORCINE) IN NACL 100-0.45 UNIT/ML-% IJ SOLN
900.0000 [IU]/h | INTRAMUSCULAR | Status: DC
  Administered 2016-11-10: 900 [IU]/h via INTRAVENOUS
  Filled 2016-11-10 (×2): qty 250

## 2016-11-10 MED ORDER — TRAMADOL HCL 50 MG PO TABS
50.0000 mg | ORAL_TABLET | Freq: Four times a day (QID) | ORAL | Status: DC | PRN
Start: 2016-11-10 — End: 2016-11-11

## 2016-11-10 MED ORDER — SODIUM CHLORIDE 0.9% FLUSH: 3.0000 mL | INTRAVENOUS | Status: DC | PRN

## 2016-11-10 MED ORDER — POLYETHYLENE GLYCOL 3350 17 G PO PACK: 17.0000 g | PACK | Freq: Every day | ORAL | Status: DC | PRN

## 2016-11-10 MED ORDER — NITROGLYCERIN 0.4 MG SL SUBL: 0.4000 mg | SUBLINGUAL_TABLET | SUBLINGUAL | Status: DC | PRN

## 2016-11-10 NOTE — Consult Note (Addendum)
ANTICOAGULATION CONSULT NOTE - Initial Consult  Pharmacy Consult for Heparin Drip  Indication: chest pain/ACS  Allergies  Allergen Reactions  . Morphine And Related Anxiety   Patient Measurements: Height: 5\' 9"  (175.3 cm) Weight: 164 lb (74.4 kg) IBW/kg (Calculated) : 70.7  Vital Signs: Temp: 97.6 F (36.4 C) (10/17 1452) Temp Source: Oral (10/17 1452) BP: 146/91 (10/17 1715) Pulse Rate: 76 (10/17 1715)  Labs:  Recent Labs  11/10/16 1453  HGB 10.6*  HCT 30.9*  PLT 221  CREATININE 1.48*  TROPONINI <0.03    Estimated Creatinine Clearance: 49.8 mL/min (A) (by C-G formula based on SCr of 1.48 mg/dL (H)).   Medical History: Past Medical History:  Diagnosis Date  . Depression   . Diabetes mellitus without complication ([place])   . Gout   . High cholesterol   . Hypertension   . Myocardial infarction [facility])     Assessment: Pharmacy consulted for heparin drip dosing and monitoring in a 66 yo male with ACS/CHEST PAIN. Patient dose take Eliquis (Apixaban) at home, last dose was reported at 10/16 at 0800.   Goal of Therapy:  Heparin level 0.3-0.7 units/ml aPTT 66-102 seconds Monitor platelets by anticoagulation protocol: Yes   Plan:  Baseline labs ordered.  Give 4000 units bolus x 1 Start heparin infusion at 900 units/hr Will need to adjust heparin drip based on aPTT levels until aPTT and HL correlate if initial HL is elevated.  Check anti-Xa in level in 6 hours and daily while on heparin. Continue to monitor H&H and platelets   Pernell Dupre, PharmD, BCPS Clinical Pharmacist 11/10/2016 5:34 PM    10/18 0030 aPTT 80, heparin level 0.88. Continue current regimen. Recheck aPTT, CBC, and heparin level with tomorrow AM labs.  Sim Boast, PharmD, BCPS  11/11/16 1:29 AM

## 2016-11-10 NOTE — ED Provider Notes (Signed)
[facility] Emergency Department Provider Note  ____________________________________________  Time seen: Approximately 4:26 PM  I have reviewed the triage vital signs and the nursing notes.   HISTORY  Chief Complaint Chest Pain   HPI Casey French is a 66 y.o. male with a history of CAD status post stents, diabetes, hypertension, hyperlipidemia who presents for evaluation of chest pain. Patient's last left heart catheterizationwas in July 2017. At that time patient received a stent to the mid LAD. Since then patient reports not having any further episodes of chest pain. Today at 2:00 he was driving home when he started having chest pain. He describes the pain as a tightness that starts on the left side and radiates across his chest associated with the paresthesias of his bilateral upper extremities. He reports that his chest feels very heavy and tight and he has trouble breathing. He has had several episodes like this today. They usually last 10-15 minutes at a time. Currently he is describing 6 out of 10 chest pain. No fever or chills, no cough or congestion. Patient endorses compliance with his Eliquis and ASA. Patient is followed by [name]  Past Medical History:  Diagnosis Date  . Depression   . Diabetes mellitus without complication ([place])   . Gout   . High cholesterol   . Hypertension   . Myocardial infarction [facility])     Patient Active Problem List   Diagnosis Date Noted  . Unstable angina ([place]) 11/10/2016  . Angina at rest [facility]) 05/01/2015  . Hypertensive urgency 06/14/2014  . Diverticulitis 06/14/2014  . AKI (acute kidney injury) ([place]) 06/14/2014  . Benign essential HTN 06/14/2014  . Hyperlipidemia 06/14/2014  . DM type 2 (diabetes mellitus, type 2) ([place]) 06/14/2014  . CAD (coronary artery disease) 06/14/2014  . GERD without esophagitis 06/14/2014  . Closed head injury 03/24/2013    Past Surgical History:  Procedure Laterality  Date  . CARDIAC CATHETERIZATION    . right hand surgery     3 screws in right hand    Prior to Admission medications   Medication Sig Start Date End Date Taking? Authorizing Provider  allopurinol (ZYLOPRIM) 100 MG tablet Take 100 mg by mouth daily.   Yes [provider]  aspirin EC 81 MG tablet Take 81 mg by mouth daily.   Yes [provider]  aspirin-acetaminophen-caffeine (EXCEDRIN MIGRAINE) [phone] MG tablet Take 1 tablet by mouth every 6 (six) hours as needed for headache.   Yes [provider]  ELIQUIS 5 MG TABS tablet Take 5 mg by mouth daily. 08/16/16  Yes [provider]  gabapentin (NEURONTIN) 300 MG capsule Take 300 mg by mouth at bedtime. 09/25/16  Yes [provider]  hydrochlorothiazide (HYDRODIURIL) 12.5 MG tablet Take 37.5 mg by mouth daily.    Yes [provider]  isosorbide mononitrate (IMDUR) 60 MG 24 hr tablet Take 60 mg by mouth daily.   Yes [provider]  lisinopril (ZESTRIL) 5 MG tablet Take 1 tablet (5 mg total) by mouth daily. Patient taking differently: Take 10 mg by mouth daily.  06/16/14  Yes Gouru, Illene Silver, MD  metFORMIN (GLUCOPHAGE-XR) 500 MG 24 hr tablet Take 500 mg by mouth 2 (two) times daily.    Yes [provider]  metoprolol succinate (TOPROL-XL) 100 MG 24 hr tablet Take 100 mg by mouth daily.   Yes [provider]  nitroGLYCERIN (NITROSTAT) 0.4 MG SL tablet Place 0.4 mg under the tongue every 5 (five)  minutes as needed for chest pain.   Yes [provider]  omeprazole (PRILOSEC) 20 MG capsule Take 20 mg by mouth daily.   Yes [provider]  rosuvastatin (CRESTOR) 20 MG tablet Take 20 mg by mouth daily.   Yes [provider]  ondansetron (ZOFRAN ODT) 4 MG disintegrating tablet Take 1 tablet (4 mg total) by mouth every 8 (eight) hours as needed for nausea or vomiting. Patient not taking: Reported on 11/10/2016 10/16/16   Darel Hong, MD    oxyCODONE-acetaminophen (ROXICET) 5-325 MG tablet Take 1 tablet by mouth every 6 (six) hours as needed for severe pain. Patient not taking: Reported on 11/10/2016 10/16/16   Darel Hong, MD    Allergies Morphine and related  Family History  Problem Relation Age of Onset  . CAD Other     Social History Social History  Substance Use Topics  . Smoking status: Former Smoker    Quit date: 03/24/2001  . Smokeless tobacco: Never Used  . Alcohol use No    Review of Systems  Constitutional: Negative for fever. Eyes: Negative for visual changes. ENT: Negative for sore throat. Neck: No neck pain  Cardiovascular: + chest pain. Respiratory: + shortness of breath. Gastrointestinal: Negative for abdominal pain, vomiting or diarrhea. Genitourinary: Negative for dysuria. Musculoskeletal: Negative for back pain. Skin: Negative for rash. Neurological: Negative for headaches, weakness or numbness. Psych: No SI or HI  ____________________________________________   PHYSICAL EXAM:  VITAL SIGNS: ED Triage Vitals  Enc Vitals Group     BP 11/10/16 1452 137/74     Pulse Rate 11/10/16 1452 88     Resp 11/10/16 1452 16     Temp 11/10/16 1452 97.6 F (36.4 C)     Temp Source 11/10/16 1452 Oral     SpO2 11/10/16 1452 99 %     Weight 11/10/16 1454 164 lb (74.4 kg)     Height 11/10/16 1454 5\' 9"  (1.753 m)     Head Circumference --      Peak Flow --      Pain Score 11/10/16 1454 7     Pain Loc --      Pain Edu? --      Excl. in [place]? --     Constitutional: Alert and oriented. Well appearing and in no apparent distress. HEENT:      Head: Normocephalic and atraumatic.         Eyes: Conjunctivae are normal. Sclera is non-icteric.       Mouth/Throat: Mucous membranes are moist.       Neck: Supple with no signs of meningismus. Cardiovascular: Regular rate and rhythm. No murmurs, gallops, or rubs. 2+ symmetrical distal pulses are present in all extremities. No JVD. Respiratory: Normal  respiratory effort. Lungs are clear to auscultation bilaterally. No wheezes, crackles, or rhonchi.  Gastrointestinal: Soft, non tender, and non distended with positive bowel sounds. No rebound or guarding. rectal exam showed light brown stool guaiac negative. Musculoskeletal: Nontender with normal range of motion in all extremities. No edema, cyanosis, or erythema of extremities. Neurologic: Normal speech and language. Face is symmetric. Moving all extremities. No gross focal neurologic deficits are appreciated. Skin: Skin is warm, dry and intact. No rash noted. Psychiatric: Mood and affect are normal. Speech and behavior are normal.  ____________________________________________   LABS (all labs ordered are listed, but only abnormal results are displayed)  Labs Reviewed  BASIC METABOLIC PANEL - Abnormal; Notable for the following:  Result Value   CO2 19 (*)    Glucose, Bld 187 (*)    Creatinine, Ser 1.48 (*)    GFR calc non Af Amer 48 (*)    GFR calc Af Amer 56 (*)    All other components within normal limits  CBC - Abnormal; Notable for the following:    RBC 3.35 (*)    Hemoglobin 10.6 (*)    HCT 30.9 (*)    All other components within normal limits  TROPONIN I   ____________________________________________  EKG  ED ECG REPORT I, Rudene Re, the attending physician, personally viewed and interpreted this ECG.  14:46 - normal sinus rhythm, rate of 91, normal intervals, normal axis, no ST elevations or depressions.  16:25 - normal sinus rhythm, rate of 78, normal intervals, normal axis, no ST elevations or depressions. Unchanged from prior.  ____________________________________________  RADIOLOGY  CXR:  No acute abnormality noted. ____________________________________________   PROCEDURES  Procedure(s) performed: None Procedures Critical Care performed:  None ____________________________________________   INITIAL IMPRESSION / ASSESSMENT AND PLAN / ED  COURSE   66 y.o. male with a history of CAD status post stents, diabetes, hypertension, hyperlipidemia who presents for evaluation of chest pain.presentation concerning for ACS. 2 EKGs with no ischemic changes. First troponin is negative. We'll give a full dose of aspirin and nitroglycerin. We'll monitor on telemetry. Plan for admission.  patient also with worsening anemia with a hemoglobin drop from 12.9-10.6 in the last 30 days. Rectal exam is negative for blood or melena. Patient and his wife do endorse a couple episodes of bright red blood per rectum that happen over the last month. He is scheduled for a colonoscopy. remaining of his labs with no changes from baseline.   _________________________ 4:40 PM on 11/10/2016 -----------------------------------------  Pain free after one sublingual nitro. Repeat EKG with no evidence of ischemia. Discussed with Dr. Ubaldo Glassing who recommended holding heparin since patient is pain free and has had recent GIB. Discussed patient with hospitalist for admission.   _________________________ 5:14 PM on 11/10/2016 -----------------------------------------  Patient complaining of chest pain again. Will start on nitro gtt. Hospitalist at the bedside for admission     As part of my medical decision making, I reviewed the following data within the [place] History obtained from family, Nursing notes reviewed and incorporated, Labs reviewed , EKG interpreted , Old EKG reviewed, Old chart reviewed, Radiograph reviewed , Discussed with admitting physician , Notes from prior ED visits and [place] Controlled Substance Database    Pertinent labs & imaging results that were available during my care of the patient were reviewed by me and considered in my medical decision making (see chart for details).    ____________________________________________   FINAL CLINICAL IMPRESSION(S) / ED DIAGNOSES  Final diagnoses:  Unstable angina (HCC)      NEW  MEDICATIONS STARTED DURING THIS VISIT:  New Prescriptions   No medications on file     Note:  This document was prepared using Dragon voice recognition software and may include unintentional dictation errors.    Rudene Re, MD 11/10/16 [phone]

## 2016-11-10 NOTE — ED Notes (Signed)
Pt states that chest pain started today around 1430. Pain on the left side that radiates across tot he right side. Pt states the chest get tight then lets go. Pt also report "tingling in the feet and tingling that goes down arm to make fingers tingle. Tightness int he neck." VS stable Family at bedside RN will continue to monitor.

## 2016-11-10 NOTE — ED Triage Notes (Signed)
Arrives with c/o "tightness to chest and difficulty breathing" symptoms started at around 1400.

## 2016-11-10 NOTE — H&P (Signed)
[facility] NAME: Casey French    MR#:  [id number]  DATE OF BIRTH:  15-Aug-1950  DATE OF ADMISSION:  11/10/2016  PRIMARY CARE PHYSICIAN: [name], MD   REQUESTING/REFERRING PHYSICIAN: dr Alfred Levins  CHIEF COMPLAINT:   Chest pain HISTORY OF PRESENT ILLNESS:  Casey French  is a 66 y.o. male with a known history of CAD/cardiac stent, PAF , DM and hyperlipidemia who presents today with chest pain. Patient reports that approximately 2:00 PM I'll driving home he developed substernal chest pain radiating to bilateral upper extremities associated with numbness and tingling. He denies shortness of breath and nausea associated with the pain. He felt that is very heavy and tight. He has had a several episodes like this today. No aggravating factors however it was relieved with nitroglycerin. He does report his chest pain has come back and is currently a 5 out of 10. He takes aspirin and Eliquis.  He had cardiac catheterization July 2017 and received a stent to the mid LAD. However since that time he has not had chest pain.  He has also reported some blood in his stools at times. His guaiac was negative in the emergency room today. He is scheduled for an outpatient colonoscopy.  PAST MEDICAL HISTORY:   Past Medical History:  Diagnosis Date  . Depression   . Diabetes mellitus without complication ([place])   . Gout   . High cholesterol   . Hypertension   . Myocardial infarction ([place])    PAF PAST SURGICAL HISTORY:   Past Surgical History:  Procedure Laterality Date  . CARDIAC CATHETERIZATION    . right hand surgery     3 screws in right hand    SOCIAL HISTORY:   Social History  Substance Use Topics  . Smoking status: Former Smoker    Quit date: 03/24/2001  . Smokeless tobacco: Never Used  . Alcohol use No    FAMILY HISTORY:   Family History  Problem Relation Age of Onset  . CAD Other     DRUG ALLERGIES:    Allergies  Allergen Reactions  . Morphine And Related Anxiety    REVIEW OF SYSTEMS:   Review of Systems  Constitutional: Negative.  Negative for chills, fever and malaise/fatigue.  HENT: Negative.  Negative for ear discharge, ear pain, hearing loss, nosebleeds and sore throat.   Eyes: Negative.  Negative for blurred vision and pain.  Respiratory: Negative.  Negative for cough, hemoptysis, shortness of breath and wheezing.   Cardiovascular: Positive for chest pain. Negative for palpitations and leg swelling.  Gastrointestinal: Negative.  Negative for abdominal pain, blood in stool, diarrhea, nausea and vomiting.  Genitourinary: Negative.  Negative for dysuria.  Musculoskeletal: Negative.  Negative for back pain.  Skin: Negative.   Neurological: Negative for dizziness, tremors, speech change, focal weakness, seizures and headaches.  Endo/Heme/Allergies: Negative.  Does not bruise/bleed easily.  Psychiatric/Behavioral: Negative.  Negative for depression, hallucinations and suicidal ideas.    MEDICATIONS AT HOME:   Prior to Admission medications   Medication Sig Start Date End Date Taking? Authorizing Provider  allopurinol (ZYLOPRIM) 100 MG tablet Take 100 mg by mouth daily.   Yes [provider]  aspirin EC 81 MG tablet Take 81 mg by mouth daily.   Yes [provider]  aspirin-acetaminophen-caffeine (EXCEDRIN MIGRAINE) [phone] MG tablet Take 1 tablet by mouth every 6 (six) hours as needed for headache.   Yes [provider]  ELIQUIS 5 MG  TABS tablet Take 5 mg by mouth daily. 08/16/16  Yes [provider]  gabapentin (NEURONTIN) 300 MG capsule Take 300 mg by mouth at bedtime. 09/25/16  Yes [provider]  hydrochlorothiazide (HYDRODIURIL) 12.5 MG tablet Take 37.5 mg by mouth daily.    Yes [provider]  isosorbide mononitrate (IMDUR) 60 MG 24 hr tablet Take 60 mg by mouth daily.   Yes [provider]  lisinopril  (ZESTRIL) 5 MG tablet Take 1 tablet (5 mg total) by mouth daily. Patient taking differently: Take 10 mg by mouth daily.  06/16/14  Yes Gouru, Illene Silver, MD  metFORMIN (GLUCOPHAGE-XR) 500 MG 24 hr tablet Take 500 mg by mouth 2 (two) times daily.    Yes [provider]  metoprolol succinate (TOPROL-XL) 100 MG 24 hr tablet Take 100 mg by mouth daily.   Yes [provider]  nitroGLYCERIN (NITROSTAT) 0.4 MG SL tablet Place 0.4 mg under the tongue every 5 (five) minutes as needed for chest pain.   Yes [provider]  omeprazole (PRILOSEC) 20 MG capsule Take 20 mg by mouth daily.   Yes [provider]  rosuvastatin (CRESTOR) 20 MG tablet Take 20 mg by mouth daily.   Yes [provider]  ondansetron (ZOFRAN ODT) 4 MG disintegrating tablet Take 1 tablet (4 mg total) by mouth every 8 (eight) hours as needed for nausea or vomiting. Patient not taking: Reported on 11/10/2016 10/16/16   Darel Hong, MD  oxyCODONE-acetaminophen (ROXICET) 5-325 MG tablet Take 1 tablet by mouth every 6 (six) hours as needed for severe pain. Patient not taking: Reported on 11/10/2016 10/16/16   Darel Hong, MD      VITAL SIGNS:  Blood pressure 135/90, pulse 88, temperature 97.6 F (36.4 C), temperature source Oral, resp. rate (!) 22, height 5\' 9"  (1.753 m), weight 74.4 kg (164 lb), SpO2 99 %.  PHYSICAL EXAMINATION:   Physical Exam  Constitutional: He is oriented to person, place, and time and well-developed, well-nourished, and in no distress. No distress.  HENT:  Head: Normocephalic.  Eyes: No scleral icterus.  Neck: Normal range of motion. Neck supple. No JVD present. No tracheal deviation present.  Cardiovascular: Normal rate, regular rhythm and normal heart sounds.  Exam reveals no gallop and no friction rub.   No murmur heard. Pulmonary/Chest: Effort normal and breath sounds normal. No respiratory distress. He has no wheezes. He has no rales. He exhibits no tenderness.   Abdominal: Soft. Bowel sounds are normal. He exhibits no distension and no mass. There is no tenderness. There is no rebound and no guarding.  Musculoskeletal: Normal range of motion. He exhibits no edema.  Neurological: He is alert and oriented to person, place, and time.  Skin: Skin is warm. No rash noted. No erythema.  Psychiatric: Affect and judgment normal.      LABORATORY PANEL:   CBC  Recent Labs Lab 11/10/16 1453  WBC 9.2  HGB 10.6*  HCT 30.9*  PLT 221   ------------------------------------------------------------------------------------------------------------------  Chemistries   Recent Labs Lab 11/10/16 1453  NA 135  K 3.5  CL 102  CO2 19*  GLUCOSE 187*  BUN 18  CREATININE 1.48*  CALCIUM 9.3   ------------------------------------------------------------------------------------------------------------------  Cardiac Enzymes  Recent Labs Lab 11/10/16 1453  TROPONINI <0.03   ------------------------------------------------------------------------------------------------------------------  RADIOLOGY:  Dg Chest 2 View  Result Date: 11/10/2016 CLINICAL DATA:  Chest pain and shortness of Breath EXAM: CHEST  2 VIEW COMPARISON:  10/16/2016 FINDINGS: Cardiac shadow is within normal limits.  Lungs are well aerated bilaterally with mild scarring in the right base. No focal infiltrate or sizable effusion is seen. Degenerative changes of the thoracic spine are noted. IMPRESSION: No acute abnormality noted. Electronically Signed   By: Inez Catalina M.D.   On: 11/10/2016 15:33    EKG:   It should with her mother still be sure depression  IMPRESSION AND PLAN:   66 year old male with history of CAD/mid LAD stent, hypertension and hyperlipidemia who presents with chest pain associated with bilateral hand tingling.  1. Unstable angina with history of CAD and cardiac stent in 2017 Consult [facility] cardiology Start heparin drip and nitroglycerin drip due to ongoing  chest pain Stop Eliquis for now. Follow troponins Continue telemetry monitoring  Continue aspirin, lisinopril, isosorbide and metoprolol Continue statin Check lipid panel and A1c  2. Recent hypertension: Continue HCTZ, isosorbide, lisinopril and metoprolol  3. Diabetes: Hold metformin Start sliding scale  4. Hyperlipidemia: Continue statin Check lipid panel  5. Acute on chronic anemia with reported bloody stools however guaiac negative Monitor CBC and heparin drip Check anemia panel  6. PAF: Continue metoprolol for heart rate control I have stopped Eliquis for now as he will be started on Heparin gtt.       All the records are reviewed and case discussed with ED provider. Management plans discussed with the patient and wife and they are in agreement  CODE STATUS: FULL  TOTAL TIME TAKING CARE OF THIS PATIENT: 45 minutes.    Haileigh Pitz M.D on 11/10/2016 at 5:15 PM  Between 7am to 6pm - Pager - [phone]  After 6pm go to [email or web address] - password [email or web address] [facility]  Office  [phone]  CC: Primary care physician; [name], MD   '

## 2016-11-10 NOTE — Consult Note (Signed)
[facility]  CARDIOLOGY CONSULT NOTE  Patient ID: Casey French MRN: [id number] DOB/AGE: 1950-08-01 66 y.o.  Admit date: 11/10/2016 Referring Physician Dr. Benjie Karvonen Primary Physician  Dr. Kary Kos Primary Cardiologist [name] Reason for Consultation Chest pain with both typical and atypical features   HPI: Patient is a 66 year old male with history of coronary artery disease status post inferior ST elevation myocardial infarction in 2003 treated with a Cypher stent. An occluded distal RCA with residual 60% stenosis in the proximal LAD by catheter in 2005. He had the placement of a 3.5 x 12 mm Xience stent in the mid LAD at [facility] in June 2017. He was discharged on aspirin and Effient. He also has a diagnosis of intermittent atrial fibrillation with a chads score of 3. He was continued on dual antiplatelet therapy for approximately one year and then the Effient was discontinued and he was started on apixiban for anticoagulation therapy along with aspirin. He has done fairly well with this regimen as well as Crestor for anticoagulation. He noted episodes of bilateral upper extremity numbness and tingling as well as substernal chest pain as well as some numbness and tingling in both legs. This occurred while driving. It was nonexertional. He had several episodes similar to this. He stated it was relieved by nitroglycerin. He presented to the emergency room where his electrocardiogram showed sinus rhythm with no ischemia. Troponins have in negative. He was taken off eliquis placed on heparin and continue with aspirin.  Review of Systems  Constitutional: Negative.   HENT: Negative.   Eyes: Negative.  Photophobia: [email or web address].  Respiratory: Negative.   Cardiovascular: Positive for chest pain.  Gastrointestinal: Negative.   Genitourinary: Negative.   Musculoskeletal: Negative.   Skin: Negative.   Neurological: Positive for tingling and  sensory change.  Endo/Heme/Allergies: Negative.   Psychiatric/Behavioral: Negative.     Past Medical History:  Diagnosis Date  . Depression   . Diabetes mellitus without complication ([place])   . Gout   . High cholesterol   . Hypertension   . Myocardial infarction [facility])     Family History  Problem Relation Age of Onset  . CAD Other     Social History   Social History  . Marital status: Married    Spouse name: N/A  . Number of children: N/A  . Years of education: N/A   Occupational History  . Not on file.   Social History Main Topics  . Smoking status: Former Smoker    Quit date: 03/24/2001  . Smokeless tobacco: Never Used  . Alcohol use No  . Drug use: No  . Sexual activity: Not on file   Other Topics Concern  . Not on file   Social History Narrative  . No narrative on file    Past Surgical History:  Procedure Laterality Date  . CARDIAC CATHETERIZATION    . right hand surgery     3 screws in right hand     Prescriptions Prior to Admission  Medication Sig Dispense Refill Last Dose  . allopurinol (ZYLOPRIM) 100 MG tablet Take 100 mg by mouth daily.   11/09/2016 at 0800  . aspirin EC 81 MG tablet Take 81 mg by mouth daily.   11/09/2016 at 0800  . aspirin-acetaminophen-caffeine (EXCEDRIN MIGRAINE) 250-250-65 MG tablet Take 1 tablet by mouth every 6 (six) hours as needed for headache.   PRN at PRN  . ELIQUIS 5 MG TABS tablet Take 5  mg by mouth daily.  11 11/09/2016 at 0800  . gabapentin (NEURONTIN) 300 MG capsule Take 300 mg by mouth at bedtime.  11 11/09/2016 at 2000  . hydrochlorothiazide (HYDRODIURIL) 12.5 MG tablet Take 37.5 mg by mouth daily.    11/09/2016 at 0800  . isosorbide mononitrate (IMDUR) 60 MG 24 hr tablet Take 60 mg by mouth daily.   11/09/2016 at 0800  . lisinopril (ZESTRIL) 5 MG tablet Take 1 tablet (5 mg total) by mouth daily. (Patient taking differently: Take 10 mg by mouth daily. ) 30 tablet 0 11/09/2016 at 0800  . metFORMIN (GLUCOPHAGE-XR) 500  MG 24 hr tablet Take 500 mg by mouth 2 (two) times daily.    11/09/2016 at 0800  . metoprolol succinate (TOPROL-XL) 100 MG 24 hr tablet Take 100 mg by mouth daily.   11/09/2016 at 0800  . nitroGLYCERIN (NITROSTAT) 0.4 MG SL tablet Place 0.4 mg under the tongue every 5 (five) minutes as needed for chest pain.   PRN at PRN  . omeprazole (PRILOSEC) 20 MG capsule Take 20 mg by mouth daily.   11/09/2016 at 0800  . rosuvastatin (CRESTOR) 20 MG tablet Take 20 mg by mouth daily.   11/09/2016 at 0800  . ondansetron (ZOFRAN ODT) 4 MG disintegrating tablet Take 1 tablet (4 mg total) by mouth every 8 (eight) hours as needed for nausea or vomiting. (Patient not taking: Reported on 11/10/2016) 20 tablet 0 Completed Course at Unknown time  . oxyCODONE-acetaminophen (ROXICET) 5-325 MG tablet Take 1 tablet by mouth every 6 (six) hours as needed for severe pain. (Patient not taking: Reported on 11/10/2016) 11 tablet 0 Completed Course at Unknown time    Physical Exam: Blood pressure 132/82, pulse 87, temperature 97.7 F (36.5 C), temperature source Oral, resp. rate 18, height 5\' 9"  (1.753 m), weight 71.5 kg (157 lb 9.6 oz), SpO2 97 %.   Wt Readings from Last 1 Encounters:  11/10/16 71.5 kg (157 lb 9.6 oz)     General appearance: alert and cooperative Resp: clear to auscultation bilaterally Cardio: normal apical impulse and regular rate and rhythm GI: soft, non-tender; bowel sounds normal; no masses,  no organomegaly Extremities: extremities normal, atraumatic, no cyanosis or edema Neurologic: Grossly normal  Labs:   Lab Results  Component Value Date   WBC 9.2 11/10/2016   HGB 10.6 (L) 11/10/2016   HCT 30.9 (L) 11/10/2016   MCV 92.3 11/10/2016   PLT 221 11/10/2016    Recent Labs Lab 11/10/16 1453  NA 135  K 3.5  CL 102  CO2 19*  BUN 18  CREATININE 1.48*  CALCIUM 9.3  GLUCOSE 187*   Lab Results  Component Value Date   CKTOTAL 198 03/23/2013   CKMB 4.6 (H) 03/23/2013   TROPONINI <0.03  11/10/2016      Radiology: no acute abnormality EKG: nsr with no ischemia  ASSESSMENT AND PLAN:  Pt wth history of cad s/p pci in 7/17treated with asa and effient for 1 year and then effient was stopped, history of paroxysmal afib treated with rate control and anticoagulation with eliquis, Admitted with complaints of chest arm and leg tiingly and pain. Has ruled out for an mi thus far. Will hold eliquis, and continue with asa. Patient is ruled out for myocardial infarction. Will remain off" and consider functional study today for evaluation of ischemia. If abnormal would consider proceeding left heart catheter and not a Candidate for today secondary to being on L Aquinas chose been discontinued. Signed: Javier Docker  Fath MD, [facility] 11/10/2016, 10:19 PM

## 2016-11-11 ENCOUNTER — Inpatient Hospital Stay: Payer: Medicare Other

## 2016-11-11 DIAGNOSIS — Z23 Encounter for immunization: Secondary | ICD-10-CM | POA: Diagnosis not present

## 2016-11-11 DIAGNOSIS — I200 Unstable angina: Secondary | ICD-10-CM | POA: Diagnosis present

## 2016-11-11 DIAGNOSIS — I25110 Atherosclerotic heart disease of native coronary artery with unstable angina pectoris: Secondary | ICD-10-CM | POA: Diagnosis not present

## 2016-11-11 LAB — NM MYOCAR MULTI W/SPECT W/WALL MOTION / EF
CHL CUP NUCLEAR SRS: 1
CHL CUP NUCLEAR SSS: 0
CHL CUP RESTING HR STRESS: 73 {beats}/min
CSEPEDS: 0 s
CSEPEW: 1 METS
CSEPHR: 65 %
CSEPPHR: 101 {beats}/min
Exercise duration (min): 1 min
LV dias vol: 68 mL (ref 62–150)
LV sys vol: 36 mL
MPHR: 101 {beats}/min
NUC STRESS TID: 1.1
SDS: 0

## 2016-11-11 LAB — HEMOGLOBIN A1C
HEMOGLOBIN A1C: 6.4 % — AB (ref 4.8–5.6)
Mean Plasma Glucose: 136.98 mg/dL

## 2016-11-11 LAB — TROPONIN I
Troponin I: <0.03 ng/mL (ref <0.03)
Troponin I: <0.03 ng/mL (ref <0.03)

## 2016-11-11 LAB — CBC
HCT: 28.4 % — ABNORMAL LOW (ref 40.0–52.0)
HEMOGLOBIN: 9.8 g/dL — AB (ref 13.0–18.0)
MCH: 31.9 pg (ref 26.0–34.0)
MCHC: 34.5 g/dL (ref 32.0–36.0)
MCV: 92.5 fL (ref 80.0–100.0)
PLATELETS: 206 10*3/uL (ref 150–440)
RBC: 3.07 MIL/uL — AB (ref 4.40–5.90)
RDW: 14.1 % (ref 11.5–14.5)
WBC: 7 10*3/uL (ref 3.8–10.6)

## 2016-11-11 LAB — BASIC METABOLIC PANEL
ANION GAP: 8 (ref 5–15)
BUN: 17 mg/dL (ref 6–20)
CALCIUM: 8.8 mg/dL — AB (ref 8.9–10.3)
CO2: 24 mmol/L (ref 22–32)
Chloride: 107 mmol/L (ref 101–111)
Creatinine, Ser: 1.4 mg/dL — ABNORMAL HIGH (ref 0.61–1.24)
GFR, EST AFRICAN AMERICAN: 59 mL/min — AB (ref >60)
GFR, EST NON AFRICAN AMERICAN: 51 mL/min — AB (ref >60)
Glucose, Bld: 104 mg/dL — ABNORMAL HIGH (ref 65–99)
Potassium: 3.5 mmol/L (ref 3.5–5.1)
SODIUM: 139 mmol/L (ref 135–145)

## 2016-11-11 LAB — GLUCOSE, CAPILLARY
GLUCOSE-CAPILLARY: 110 mg/dL — AB (ref 65–99)
GLUCOSE-CAPILLARY: 128 mg/dL — AB (ref 65–99)

## 2016-11-11 LAB — APTT: aPTT: 80 seconds — ABNORMAL HIGH (ref 24–36)

## 2016-11-11 LAB — HEPARIN LEVEL (UNFRACTIONATED): HEPARIN UNFRACTIONATED: 0.88 [IU]/mL — AB (ref 0.30–0.70)

## 2016-11-11 MED ORDER — TECHNETIUM TC 99M TETROFOSMIN IV KIT
13.0450 | PACK | Freq: Once | INTRAVENOUS | Status: AC | PRN
  Administered 2016-11-11: 13.045 via INTRAVENOUS

## 2016-11-11 MED ORDER — TECHNETIUM TC 99M TETROFOSMIN IV KIT
30.9610 | PACK | Freq: Once | INTRAVENOUS | Status: AC | PRN
  Administered 2016-11-11: 30.961 via INTRAVENOUS

## 2016-11-11 MED ORDER — REGADENOSON 0.4 MG/5ML IV SOLN
0.4000 mg | Freq: Once | INTRAVENOUS | Status: AC
  Administered 2016-11-11: 0.4 mg via INTRAVENOUS
  Filled 2016-11-11: qty 5

## 2016-11-11 NOTE — Discharge Instructions (Signed)
Resume diet and activity as before ° ° °

## 2016-11-11 NOTE — Plan of Care (Signed)
Problem: Safety: Goal: Ability to remain free from injury will improve Outcome: Progressing Fall precautions in place non skid socks when oob  Problem: Pain Managment: Goal: General experience of comfort will improve Outcome: Not Progressing Remains on ntg gtt, prn medications   Problem: Cardiac: Goal: Ability to achieve and maintain adequate cardiovascular perfusion will improve Outcome: Not Progressing Remains on NTG gtt

## 2016-11-11 NOTE — Progress Notes (Signed)
       [facility]  SUBJECTIVE: no further chest pain.   Vitals:   11/10/16 2215 11/10/16 2230 11/10/16 2300 11/11/16 0534  BP: 132/82 130/75 105/64 109/66  Pulse: 87 88 82 90  Resp:    18  Temp:    97.9 F (36.6 C)  TempSrc:    Oral  SpO2:    96%  Weight:    71.5 kg (157 lb 11.2 oz)  Height:        Intake/Output Summary (Last 24 hours) at 11/11/16 0654 Last data filed at 11/11/16 0643  Gross per 24 hour  Intake           191.68 ml  Output             1300 ml  Net         -1108.32 ml    LABS: Basic Metabolic Panel:  Recent Labs  11/10/16 1453 11/11/16 0306  NA 135 139  K 3.5 3.5  CL 102 107  CO2 19* 24  GLUCOSE 187* 104*  BUN 18 17  CREATININE 1.48* 1.40*  CALCIUM 9.3 8.8*   Liver Function Tests: No results for input(s): AST, ALT, ALKPHOS, BILITOT, PROT, ALBUMIN in the last 72 hours. No results for input(s): LIPASE, AMYLASE in the last 72 hours. CBC:  Recent Labs  11/10/16 1453 11/11/16 0306  WBC 9.2 7.0  HGB 10.6* 9.8*  HCT 30.9* 28.4*  MCV 92.3 92.5  PLT 221 206   Cardiac Enzymes:  Recent Labs  11/10/16 1453 11/10/16 2103 11/11/16 0306  TROPONINI <0.03 <0.03 <0.03   BNP: Invalid input(s): POCBNP D-Dimer: No results for input(s): DDIMER in the last 72 hours. Hemoglobin A1C:  Recent Labs  11/10/16 2103  HGBA1C 6.4*   Fasting Lipid Panel:  Recent Labs  11/10/16 2103  CHOL 142  HDL 45  LDLCALC 75  TRIG 109  CHOLHDL 3.2   Thyroid Function Tests: No results for input(s): TSH, T4TOTAL, T3FREE, THYROIDAB in the last 72 hours.  Invalid input(s): FREET3 Anemia Panel: No results for input(s): VITAMINB12, FOLATE, FERRITIN, TIBC, IRON, RETICCTPCT in the last 72 hours.   Physical Exam: Blood pressure 109/66, pulse 90, temperature 97.9 F (36.6 C), temperature source Oral, resp. rate 18, height 5\' 9"  (1.753 m), weight 71.5 kg (157 lb 11.2 oz), SpO2 96 %.   Wt Readings from Last 1  Encounters:  11/11/16 71.5 kg (157 lb 11.2 oz)     General appearance: alert and cooperative Resp: clear to auscultation bilaterally Cardio: regular rate and rhythm GI: soft, non-tender; bowel sounds normal; no masses,  no organomegaly Extremities: extremities normal, atraumatic, no cyanosis or edema Neurologic: Grossly normal  TELEMETRY: Reviewed telemetry pt in nsr:  ASSESSMENT AND PLAN:  Active Problems:   Unstable angina (HCC)ruled out for an mi. No further chest pain. Will remain off of eliquis and proceed with lexiscan sestimibi. Not candidate for cath today due to being on Eliquis. If funcitonal study abnormal, would proceed with cath on Friday. Continue current meds including heparin, asa, nitrates, metoprolol     Teodoro Spray, MD, [facility] 11/11/2016 6:54 AM

## 2016-11-11 NOTE — Progress Notes (Signed)
Back from nuclear medicine 

## 2016-11-11 NOTE — Plan of Care (Signed)
Problem: Pain Managment: Goal: General experience of comfort will improve Outcome: Progressing Reports of dull, midsternal chest pain relieved with titration of Nitro drip to 25 mcg/min.  No changes noted on telemetry this shift.

## 2016-11-11 NOTE — Progress Notes (Signed)
To nuclear medicine via bed 

## 2016-11-12 LAB — HIV ANTIBODY (ROUTINE TESTING W REFLEX): HIV SCREEN 4TH GENERATION: NONREACTIVE

## 2016-11-22 NOTE — Discharge Summary (Signed)
[place] [facility] [place] NAME: Casey French    MR#:  [id number]  DATE OF BIRTH:  26-Aug-1950  DATE OF ADMISSION:  11/10/2016 ADMITTING PHYSICIAN: Bettey Costa, MD  DATE OF DISCHARGE: 11/11/2016  6:34 PM  PRIMARY CARE PHYSICIAN: [name], MD   ADMISSION DIAGNOSIS:  Unstable angina ([place]) [I20.0]  DISCHARGE DIAGNOSIS:  Active Problems:   Unstable angina ([place])   SECONDARY DIAGNOSIS:   Past Medical History:  Diagnosis Date  . Depression   . Diabetes mellitus without complication ([place])   . Gout   . High cholesterol   . Hypertension   . Myocardial infarction [facility])      ADMITTING HISTORY   CHIEF COMPLAINT:   Chest pain HISTORY OF PRESENT ILLNESS:  Casey French  is a 66 y.o. male with a known history of CAD/cardiac stent, PAF , DM and hyperlipidemia who presents today with chest pain. Patient reports that approximately 2:00 PM I'll driving home he developed substernal chest pain radiating to bilateral upper extremities associated with numbness and tingling. He denies shortness of breath and nausea associated with the pain. He felt that is very heavy and tight. He has had a several episodes like this today. No aggravating factors however it was relieved with nitroglycerin. He does report his chest pain has come back and is currently a 5 out of 10. He takes aspirin and Eliquis.  He had cardiac catheterization July 2017 and received a stent to the mid LAD. However since that time he has not had chest pain.  He has also reported some blood in his stools at times. His guaiac was negative in the emergency room today. He is scheduled for an outpatient colonoscopy.   HOSPITAL COURSE:   *Chronic angina Patient was admitted to telemetry floor to rule out for acute coronary syndrome.  Troponins remain negative.  Cardiac catheterization was considered but patient was on Eliquis.  This was held and a Carlton Adam was done which was low  risk study with no ischemic changes.  I discussed the case with Dr. Pilar Plate of cardiology.  With normal Lexiscan patient was thought to be stable to be discharged home to follow-up with his cardiologist within 1 week.  Patient was continued on all of his appropriate cardiac medications.  Stable for discharge home.  CONSULTS OBTAINED:  Treatment Team:  Teodoro Spray, MD Corey Skains, MD  DRUG ALLERGIES:   Allergies  Allergen Reactions  . Morphine And Related Anxiety    DISCHARGE MEDICATIONS:   Discharge Medication List as of 11/11/2016  5:48 PM    CONTINUE these medications which have NOT CHANGED   Details  allopurinol (ZYLOPRIM) 100 MG tablet Take 100 mg by mouth daily., Historical Med    aspirin EC 81 MG tablet Take 81 mg by mouth daily., Historical Med    aspirin-acetaminophen-caffeine (EXCEDRIN MIGRAINE) 250-250-65 MG tablet Take 1 tablet by mouth every 6 (six) hours as needed for headache., Historical Med    ELIQUIS 5 MG TABS tablet Take 5 mg by mouth daily., Starting Mon 08/16/2016, Historical Med    gabapentin (NEURONTIN) 300 MG capsule Take 300 mg by mouth at bedtime., Starting Sat 09/25/2016, Historical Med    hydrochlorothiazide (HYDRODIURIL) 12.5 MG tablet Take 37.5 mg by mouth daily. , Historical Med    isosorbide mononitrate (IMDUR) 60 MG 24 hr tablet Take 60 mg by mouth daily., Historical Med    lisinopril (ZESTRIL) 5 MG tablet Take 1 tablet (5 mg total)  by mouth daily., Starting Sun 06/16/2014, Print    metFORMIN (GLUCOPHAGE-XR) 500 MG 24 hr tablet Take 500 mg by mouth 2 (two) times daily. , Historical Med    metoprolol succinate (TOPROL-XL) 100 MG 24 hr tablet Take 100 mg by mouth daily., Historical Med    nitroGLYCERIN (NITROSTAT) 0.4 MG SL tablet Place 0.4 mg under the tongue every 5 (five) minutes as needed for chest pain., Historical Med    omeprazole (PRILOSEC) 20 MG capsule Take 20 mg by mouth daily., Historical Med    rosuvastatin (CRESTOR) 20 MG  tablet Take 20 mg by mouth daily., Historical Med    ondansetron (ZOFRAN ODT) 4 MG disintegrating tablet Take 1 tablet (4 mg total) by mouth every 8 (eight) hours as needed for nausea or vomiting., Starting Sat 10/16/2016, Print    oxyCODONE-acetaminophen (ROXICET) 5-325 MG tablet Take 1 tablet by mouth every 6 (six) hours as needed for severe pain., Starting Sat 10/16/2016, Print        Today   VITAL SIGNS:  Blood pressure 117/71, pulse 72, temperature 98.2 F (36.8 C), temperature source Oral, resp. rate 18, height 5\' 9"  (1.753 m), weight 71.5 kg (157 lb 11.2 oz), SpO2 94 %.  I/O:  No intake or output data in the 24 hours ending 11/22/16 1318  PHYSICAL EXAMINATION:  Physical Exam  GENERAL:  66 y.o.-year-old patient lying in the bed with no acute distress.  LUNGS: Normal breath sounds bilaterally, no wheezing, rales,rhonchi or crepitation. No use of accessory muscles of respiration.  CARDIOVASCULAR: S1, S2 normal. No murmurs, rubs, or gallops.  ABDOMEN: Soft, non-tender, non-distended. Bowel sounds present. No organomegaly or mass.  NEUROLOGIC: Moves all 4 extremities. PSYCHIATRIC: The patient is alert and oriented x 3.  SKIN: No obvious rash, lesion, or ulcer.   DATA REVIEW:   CBC No results for input(s): WBC, HGB, HCT, PLT in the last 168 hours.  Chemistries  No results for input(s): NA, K, CL, CO2, GLUCOSE, BUN, CREATININE, CALCIUM, MG, AST, ALT, ALKPHOS, BILITOT in the last 168 hours.  Invalid input(s): GFRCGP  Cardiac Enzymes No results for input(s): TROPONINI in the last 168 hours.  Microbiology Results  Results for orders placed or performed during the hospital encounter of 03/24/13  MRSA PCR Screening     Status: None   Collection Time: 03/24/13  2:48 AM  Result Value Ref Range Status   MRSA by PCR NEGATIVE NEGATIVE Final    Comment:        The GeneXpert MRSA Assay (FDA approved for NASAL specimens only), is one component of a comprehensive MRSA  colonization surveillance program. It is not intended to diagnose MRSA infection nor to guide or monitor treatment for MRSA infections.    RADIOLOGY:  No results found.  Follow up with PCP in 1 week.  Management plans discussed with the patient, family and they are in agreement.  CODE STATUS:  Code Status History    Date Active Date Inactive Code Status Order ID Comments User Context   11/10/2016  5:56 PM 11/11/2016  9:39 PM Full Code [id number]  Bettey Costa, MD ED   05/01/2015  2:29 AM 05/01/2015  7:04 PM Full Code [id number]  Lance Coon, MD ED   06/14/2014  1:13 AM 06/16/2014  4:50 PM Full Code [id number]  Hower, Aaron Mose, MD ED   03/24/2013  3:52 AM 03/28/2013  2:02 PM Full Code [id number]  Hosie Spangle, MD Inpatient      TOTAL TIME TAKING CARE OF  THIS PATIENT ON DAY OF DISCHARGE: more than 30 minutes.   Hillary Bow R M.D on 11/22/2016 at 1:18 PM  Between 7am to 6pm - Pager - [phone]  After 6pm go to [email or web address] - password [email or web address] [facility]  Office  [phone]  CC: Primary care physician; [name], MD  Note: This dictation was prepared with Dragon dictation along with smaller phrase technology. Any transcriptional errors that result from this process are unintentional.

## 2017-01-13 ENCOUNTER — Other Ambulatory Visit: Payer: Self-pay

## 2017-01-13 ENCOUNTER — Emergency Department
Admission: EM | Admit: 2017-01-13 | Discharge: 2017-01-13 | Disposition: A | Discharge status: Home / Self Care | Payer: Medicare Other | Attending: Emergency Medicine | Admitting: Emergency Medicine

## 2017-01-13 ENCOUNTER — Emergency Department: Payer: Medicare Other

## 2017-01-13 DIAGNOSIS — Z7901 Long term (current) use of anticoagulants: Secondary | ICD-10-CM | POA: Diagnosis not present

## 2017-01-13 DIAGNOSIS — R112 Nausea with vomiting, unspecified: Secondary | ICD-10-CM | POA: Diagnosis not present

## 2017-01-13 DIAGNOSIS — Z7982 Long term (current) use of aspirin: Secondary | ICD-10-CM | POA: Insufficient documentation

## 2017-01-13 DIAGNOSIS — Z79899 Other long term (current) drug therapy: Secondary | ICD-10-CM | POA: Insufficient documentation

## 2017-01-13 DIAGNOSIS — I252 Old myocardial infarction: Secondary | ICD-10-CM | POA: Diagnosis not present

## 2017-01-13 DIAGNOSIS — I10 Essential (primary) hypertension: Secondary | ICD-10-CM | POA: Diagnosis not present

## 2017-01-13 DIAGNOSIS — K5792 Diverticulitis of intestine, part unspecified, without perforation or abscess without bleeding: Secondary | ICD-10-CM | POA: Diagnosis not present

## 2017-01-13 DIAGNOSIS — R1032 Left lower quadrant pain: Secondary | ICD-10-CM | POA: Diagnosis present

## 2017-01-13 DIAGNOSIS — Z87891 Personal history of nicotine dependence: Secondary | ICD-10-CM | POA: Diagnosis not present

## 2017-01-13 DIAGNOSIS — E119 Type 2 diabetes mellitus without complications: Secondary | ICD-10-CM | POA: Diagnosis not present

## 2017-01-13 LAB — CBC
HEMATOCRIT: 37.5 % — AB (ref 40.0–52.0)
Hemoglobin: 12.5 g/dL — ABNORMAL LOW (ref 13.0–18.0)
MCH: 31.1 pg (ref 26.0–34.0)
MCHC: 33.3 g/dL (ref 32.0–36.0)
MCV: 93.4 fL (ref 80.0–100.0)
Platelets: 301 10*3/uL (ref 150–440)
RBC: 4.02 MIL/uL — ABNORMAL LOW (ref 4.40–5.90)
RDW: 13.5 % (ref 11.5–14.5)
WBC: 17.7 10*3/uL — ABNORMAL HIGH (ref 3.8–10.6)

## 2017-01-13 LAB — LIPASE, BLOOD: LIPASE: 53 U/L — AB (ref 11–51)

## 2017-01-13 LAB — COMPREHENSIVE METABOLIC PANEL
ALT: 17 U/L (ref 17–63)
ANION GAP: 17 — AB (ref 5–15)
AST: 51 U/L — ABNORMAL HIGH (ref 15–41)
Albumin: 3.8 g/dL (ref 3.5–5.0)
Alkaline Phosphatase: 72 U/L (ref 38–126)
BILIRUBIN TOTAL: 0.9 mg/dL (ref 0.3–1.2)
BUN: 37 mg/dL — AB (ref 6–20)
CALCIUM: 10 mg/dL (ref 8.9–10.3)
CO2: 18 mmol/L — ABNORMAL LOW (ref 22–32)
Chloride: 102 mmol/L (ref 101–111)
Creatinine, Ser: 1.85 mg/dL — ABNORMAL HIGH (ref 0.61–1.24)
GFR calc Af Amer: 42 mL/min — ABNORMAL LOW (ref >60)
GFR, EST NON AFRICAN AMERICAN: 37 mL/min — AB (ref >60)
Glucose, Bld: 152 mg/dL — ABNORMAL HIGH (ref 65–99)
POTASSIUM: 4 mmol/L (ref 3.5–5.1)
Sodium: 137 mmol/L (ref 135–145)
TOTAL PROTEIN: 8.2 g/dL — AB (ref 6.5–8.1)

## 2017-01-13 MED ORDER — CIPROFLOXACIN IN D5W 400 MG/200ML IV SOLN
400.0000 mg | Freq: Once | INTRAVENOUS | Status: AC
  Administered 2017-01-13: 400 mg via INTRAVENOUS
  Filled 2017-01-13: qty 200

## 2017-01-13 MED ORDER — CIPROFLOXACIN HCL 500 MG PO TABS: 500.0000 mg | ORAL_TABLET | Freq: Two times a day (BID) | ORAL | 0 refills | Status: AC

## 2017-01-13 MED ORDER — ONDANSETRON HCL 4 MG/2ML IJ SOLN
4.0000 mg | Freq: Once | INTRAMUSCULAR | Status: AC
  Administered 2017-01-13: 4 mg via INTRAVENOUS
  Filled 2017-01-13: qty 2

## 2017-01-13 MED ORDER — HYDROMORPHONE HCL 1 MG/ML IJ SOLN
1.0000 mg | Freq: Once | INTRAMUSCULAR | Status: AC
  Administered 2017-01-13: 1 mg via INTRAVENOUS
  Filled 2017-01-13: qty 1

## 2017-01-13 MED ORDER — IOPAMIDOL (ISOVUE-300) INJECTION 61%
75.0000 mL | Freq: Once | INTRAVENOUS | Status: AC | PRN
  Administered 2017-01-13: 75 mL via INTRAVENOUS

## 2017-01-13 MED ORDER — SODIUM CHLORIDE 0.9 % IV BOLUS (SEPSIS)
1000.0000 mL | Freq: Once | INTRAVENOUS | Status: AC
  Administered 2017-01-13: 1000 mL via INTRAVENOUS

## 2017-01-13 MED ORDER — OXYCODONE-ACETAMINOPHEN 5-325 MG PO TABS: 1.0000 | ORAL_TABLET | Freq: Four times a day (QID) | ORAL | 0 refills | Status: DC | PRN

## 2017-01-13 MED ORDER — METRONIDAZOLE IN NACL 5-0.79 MG/ML-% IV SOLN
500.0000 mg | Freq: Once | INTRAVENOUS | Status: AC
  Administered 2017-01-13: 500 mg via INTRAVENOUS
  Filled 2017-01-13: qty 100

## 2017-01-13 MED ORDER — METRONIDAZOLE 500 MG PO TABS: 500.0000 mg | ORAL_TABLET | Freq: Two times a day (BID) | ORAL | 0 refills | Status: AC

## 2017-01-13 MED ORDER — OXYCODONE-ACETAMINOPHEN 5-325 MG PO TABS
1.0000 | ORAL_TABLET | Freq: Once | ORAL | Status: AC
  Administered 2017-01-13: 1 via ORAL
  Filled 2017-01-13: qty 1

## 2017-01-13 NOTE — Discharge Instructions (Addendum)
Please follow up with your primary care physician  You are being treated for diverticulitis, return to the emergency room immediately for any worsening condition including fever, bloody stool, nor worsening or uncontrolled abdominal pain, fever, or any other symptoms concerning to you.

## 2017-01-13 NOTE — ED Notes (Signed)
Pt has Hx of diverticulitits. Pain started on Tuesday. Pt has been dry heaving constantly.

## 2017-01-13 NOTE — ED Provider Notes (Signed)
[facility] Emergency Department Provider Note   ____________________________________________   First MD Initiated Contact with Patient 01/13/17 [phone]     (approximate)  I have reviewed the triage vital signs and the nursing notes.   HISTORY  Chief Complaint Abdominal Pain    HPI Casey French is a 66 y.o. male who comes into the hospital today with dry heaves and abdominal pain.  He reports that he thinks he has diverticulitis.  The patient states that he had diverticulitis back in September.  He woke up approximately 2 days ago with some left lower quadrant pain.  He vomited once and then had some dry heaves ever since.  The patient took some Zofran as well as some leftover Flagyl and Augmentin around 1030.  He states that his pain is a 10 out of 10 in intensity.  He denies any diarrhea or constipation.  The patient is here today for evaluation of his symptoms.   Past Medical History:  Diagnosis Date  . Depression   . Diabetes mellitus without complication ([place])   . Gout   . High cholesterol   . Hypertension   . Myocardial infarction [facility])     Patient Active Problem List   Diagnosis Date Noted  . Unstable angina ([place]) 11/10/2016  . Angina at rest [facility]) 05/01/2015  . Hypertensive urgency 06/14/2014  . Diverticulitis 06/14/2014  . AKI (acute kidney injury) ([place]) 06/14/2014  . Benign essential HTN 06/14/2014  . Hyperlipidemia 06/14/2014  . DM type 2 (diabetes mellitus, type 2) ([place]) 06/14/2014  . CAD (coronary artery disease) 06/14/2014  . GERD without esophagitis 06/14/2014  . Closed head injury 03/24/2013    Past Surgical History:  Procedure Laterality Date  . CARDIAC CATHETERIZATION    . right hand surgery     3 screws in right hand    Prior to Admission medications   Medication Sig Start Date End Date Taking? Authorizing Provider  allopurinol (ZYLOPRIM) 100 MG tablet Take 100 mg by mouth daily.    [provider]    aspirin EC 81 MG tablet Take 81 mg by mouth daily.    [provider]  aspirin-acetaminophen-caffeine (EXCEDRIN MIGRAINE) [phone] MG tablet Take 1 tablet by mouth every 6 (six) hours as needed for headache.    [provider]  ciprofloxacin (CIPRO) 500 MG tablet Take 1 tablet (500 mg total) by mouth 2 (two) times daily for 7 days. 01/13/17 01/20/17  Loney Hering, MD  ELIQUIS 5 MG TABS tablet Take 5 mg by mouth daily. 08/16/16   [provider]  gabapentin (NEURONTIN) 300 MG capsule Take 300 mg by mouth at bedtime. 09/25/16   [provider]  hydrochlorothiazide (HYDRODIURIL) 12.5 MG tablet Take 37.5 mg by mouth daily.     [provider]  isosorbide mononitrate (IMDUR) 60 MG 24 hr tablet Take 60 mg by mouth daily.    [provider]  lisinopril (ZESTRIL) 5 MG tablet Take 1 tablet (5 mg total) by mouth daily. Patient taking differently: Take 10 mg by mouth daily.  06/16/14   Nicholes Mango, MD  metFORMIN (GLUCOPHAGE-XR) 500 MG 24 hr tablet Take 500 mg by mouth 2 (two) times daily.     [provider]  metoprolol succinate (TOPROL-XL) 100 MG 24 hr tablet Take 100 mg by mouth daily.    [provider]  metroNIDAZOLE (FLAGYL) 500 MG tablet Take 1 tablet (500 mg total) by mouth 2 (two) times daily for 7 days.  01/13/17 01/20/17  Loney Hering, MD  nitroGLYCERIN (NITROSTAT) 0.4 MG SL tablet Place 0.4 mg under the tongue every 5 (five) minutes as needed for chest pain.    [provider]  omeprazole (PRILOSEC) 20 MG capsule Take 20 mg by mouth daily.    [provider]  ondansetron (ZOFRAN ODT) 4 MG disintegrating tablet Take 1 tablet (4 mg total) by mouth every 8 (eight) hours as needed for nausea or vomiting. Patient not taking: Reported on 11/10/2016 10/16/16   Darel Hong, MD  oxyCODONE-acetaminophen (ROXICET) 5-325 MG tablet Take 1 tablet by mouth every 6 (six) hours as needed for severe pain. Patient  not taking: Reported on 11/10/2016 10/16/16   Darel Hong, MD  oxyCODONE-acetaminophen (ROXICET) 5-325 MG tablet Take 1 tablet by mouth every 6 (six) hours as needed. 01/13/17   Loney Hering, MD  rosuvastatin (CRESTOR) 20 MG tablet Take 20 mg by mouth daily.    [provider]    Allergies Morphine and related  Family History  Problem Relation Age of Onset  . CAD Other     Social History Social History   Tobacco Use  . Smoking status: Former Smoker    Last attempt to quit: 03/24/2001    Years since quitting: 15.8  . Smokeless tobacco: Never Used  Substance Use Topics  . Alcohol use: No    Alcohol/week: 0.0 oz  . Drug use: No    Review of Systems  Constitutional: No fever/chills Eyes: No visual changes. ENT: No sore throat. Cardiovascular: Denies chest pain. Respiratory: Denies shortness of breath. Gastrointestinal:  abdominal pain.   nausea,  vomiting.  No diarrhea.  No constipation. Genitourinary: Negative for dysuria. Musculoskeletal: Negative for back pain. Skin: Negative for rash. Neurological: Negative for headaches, focal weakness or numbness.   ____________________________________________   PHYSICAL EXAM:  VITAL SIGNS: ED Triage Vitals  Enc Vitals Group     BP 01/13/17 0333 (!) 173/103     Pulse Rate 01/13/17 0333 80     Resp 01/13/17 0333 (!) 22     Temp 01/13/17 0333 98 F (36.7 C)     Temp Source 01/13/17 0333 Oral     SpO2 01/13/17 0333 98 %     Weight 01/13/17 0332 155 lb (70.3 kg)     Height 01/13/17 0332 5\' 9"  (1.753 m)     Head Circumference --      Peak Flow --      Pain Score 01/13/17 0332 10     Pain Loc --      Pain Edu? --      Excl. in [place]? --     Constitutional: Alert and oriented. Well appearing and in moderate distress. Eyes: Conjunctivae are normal. PERRL. EOMI. Head: Atraumatic. Nose: No congestion/rhinnorhea. Mouth/Throat: Mucous membranes are moist.  Oropharynx non-erythematous. Cardiovascular: Normal  rate, regular rhythm. Grossly normal heart sounds.  Good peripheral circulation. Respiratory: Normal respiratory effort.  No retractions. Lungs CTAB. Gastrointestinal: Soft with some left lower quadrant abdominal pain to palpation. No distention.  Musculoskeletal: No lower extremity tenderness nor edema.  Neurologic:  Normal speech and language.  Skin:  Skin is warm, dry and intact.  Psychiatric: Mood and affect are normal.   ____________________________________________   LABS (all labs ordered are listed, but only abnormal results are displayed)  Labs Reviewed  CBC - Abnormal; Notable for the following components:      Result Value   WBC 17.7 (*)    RBC 4.02 (*)  Hemoglobin 12.5 (*)    HCT 37.5 (*)    All other components within normal limits  COMPREHENSIVE METABOLIC PANEL - Abnormal; Notable for the following components:   CO2 18 (*)    Glucose, Bld 152 (*)    BUN 37 (*)    Creatinine, Ser 1.85 (*)    Total Protein 8.2 (*)    AST 51 (*)    GFR calc non Af Amer 37 (*)    GFR calc Af Amer 42 (*)    Anion gap 17 (*)    All other components within normal limits  LIPASE, BLOOD - Abnormal; Notable for the following components:   Lipase 53 (*)    All other components within normal limits   ____________________________________________  EKG  none ____________________________________________  RADIOLOGY  Ct Abdomen Pelvis W Contrast  Result Date: 01/13/2017 CLINICAL DATA:  Abdominal pain.  Vomiting. EXAM: CT ABDOMEN AND PELVIS WITH CONTRAST TECHNIQUE: Multidetector CT imaging of the abdomen and pelvis was performed using the standard protocol following bolus administration of intravenous contrast. CONTRAST:  53mL ISOVUE-300 IOPAMIDOL (ISOVUE-300) INJECTION 61% COMPARISON:  CT 10/16/2016 FINDINGS: Lower chest: Subsegmental atelectasis in the right lower lobe. No pleural fluid or consolidation. Coronary artery calcifications versus stents. Hepatobiliary: No focal hepatic  lesion. Possible gallbladder sludge. No calcified stones. No pericholecystic inflammation. Pancreas: No ductal dilatation or inflammation. Spleen: Normal in size without focal abnormality. Adrenals/Urinary Tract: Normal adrenal glands. No hydronephrosis or perinephric edema. Homogeneous renal enhancement with symmetric excretion on delayed phase imaging. 11 mm cyst in the lower right kidney. Urinary bladder is physiologically distended without wall thickening. Stomach/Bowel: Stomach partially distended. No perigastric inflammation. No small bowel dilatation, inflammation or obstruction. Normal appendix. Diverticulosis in the descending and sigmoid colon. Possible mild diverticulitis at the descending/ sigmoid junction with subtle pericolonic stranding. No abscess or perforation. Vascular/Lymphatic: Advanced aortic atherosclerosis. Moderate branch atherosclerosis. No aneurysm. No enlarged abdominal or pelvic lymph nodes. Reproductive: Prostate is unremarkable. Other: No free air, free fluid, or intra-abdominal fluid collection. Small fat containing umbilical hernia. Musculoskeletal: Avascular necrosis of the left femoral head without subchondral collapse, unchanged. Chronic right L5 pars interarticularis defect. No acute osseous abnormality. IMPRESSION: 1. Distal colonic diverticulosis, with possible mild diverticulitis at the descending/sigmoid junction. 2. 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 3.  Aortic Atherosclerosis (ICD10-I70.0). Electronically Signed   By: Jeb Levering M.D.   On: 01/13/2017 05:42    ____________________________________________   PROCEDURES  Procedure(s) performed: None  Procedures  Critical Care performed: No  ____________________________________________   INITIAL IMPRESSION / ASSESSMENT AND PLAN / ED COURSE  As part of my medical decision making, I reviewed the following data within the electronic MEDICAL RECORD NUMBER Notes from  prior ED visits and [place] Controlled Substance Database   This is a 66 year old male who comes into the hospital today with some abdominal pain and dry heaving.  He does have a history of diverticulitis.  My differential diagnosis includes diverticulitis, colitis, appendicitis, pancreatitis   I did check some blood work and it was significant for white blood cell count of 17.7 bicarb of 18 and anion gap of 17 and a lipase of 53.  I will send the patient for a CT scan of his abdomen for further evaluation of his symptoms.  He did receive a dose of Dilaudid and Zofran for pain and nausea.    The patient feels improved. He will receive some ciprofloxacin and Flagyl.  He will also received some  oral Percocet and then he will be discharged home. ____________________________________________   FINAL CLINICAL IMPRESSION(S) / ED DIAGNOSES  Final diagnoses:  Diverticulitis  Non-intractable vomiting with nausea, unspecified vomiting type     ED Discharge Orders        Ordered    ciprofloxacin (CIPRO) 500 MG tablet  2 times daily     01/13/17 0741    metroNIDAZOLE (FLAGYL) 500 MG tablet  2 times daily     01/13/17 0741    oxyCODONE-acetaminophen (ROXICET) 5-325 MG tablet  Every 6 hours PRN     01/13/17 0741       Note:  This document was prepared using Dragon voice recognition software and may include unintentional dictation errors.    Loney Hering, MD 01/13/17 [phone]

## 2017-01-13 NOTE — ED Provider Notes (Signed)
I accepted care as patient was receiving ablation of antibiotics for diagnosis of diverticulitis.  Antibiotics outpatient Rx printed by Dr. Dahlia Client.  I did complete the discharge instructions.   Lisa Roca, MD 01/13/17 [phone]

## 2017-01-13 NOTE — ED Triage Notes (Addendum)
Pt states started having mid lower abd pain Tuesday with vomiting. Today pain is to RUQ and still has nausea. Pt has hx of diverticulitis has colonoscopy scheduled for Friday.

## 2017-01-14 ENCOUNTER — Encounter: Admission: RE | Payer: Self-pay | Source: Ambulatory Visit

## 2017-01-14 ENCOUNTER — Ambulatory Visit: Admission: RE | Admit: 2017-01-14 | Payer: Medicare Other | Source: Ambulatory Visit | Admitting: Gastroenterology

## 2017-01-14 SURGERY — COLONOSCOPY WITH PROPOFOL
Anesthesia: General

## 2017-04-01 ENCOUNTER — Ambulatory Visit: Payer: Medicare Other | Admitting: Certified Registered"

## 2017-04-01 ENCOUNTER — Ambulatory Visit
Admission: RE | Admit: 2017-04-01 | Discharge: 2017-04-01 | Disposition: A | Discharge status: Home / Self Care | Payer: Medicare Other | Source: Ambulatory Visit | Attending: Gastroenterology | Admitting: Gastroenterology

## 2017-04-01 ENCOUNTER — Encounter
Admission: RE | Disposition: A | Discharge status: Home / Self Care | Payer: Self-pay | Source: Ambulatory Visit | Attending: Gastroenterology

## 2017-04-01 ENCOUNTER — Encounter: Payer: Self-pay | Admitting: Anesthesiology

## 2017-04-01 DIAGNOSIS — Z7984 Long term (current) use of oral hypoglycemic drugs: Secondary | ICD-10-CM | POA: Diagnosis not present

## 2017-04-01 DIAGNOSIS — K644 Residual hemorrhoidal skin tags: Secondary | ICD-10-CM | POA: Diagnosis not present

## 2017-04-01 DIAGNOSIS — Z885 Allergy status to narcotic agent status: Secondary | ICD-10-CM | POA: Diagnosis not present

## 2017-04-01 DIAGNOSIS — I252 Old myocardial infarction: Secondary | ICD-10-CM | POA: Insufficient documentation

## 2017-04-01 DIAGNOSIS — E119 Type 2 diabetes mellitus without complications: Secondary | ICD-10-CM | POA: Insufficient documentation

## 2017-04-01 DIAGNOSIS — K621 Rectal polyp: Secondary | ICD-10-CM | POA: Insufficient documentation

## 2017-04-01 DIAGNOSIS — I10 Essential (primary) hypertension: Secondary | ICD-10-CM | POA: Diagnosis not present

## 2017-04-01 DIAGNOSIS — Z79899 Other long term (current) drug therapy: Secondary | ICD-10-CM | POA: Insufficient documentation

## 2017-04-01 DIAGNOSIS — M109 Gout, unspecified: Secondary | ICD-10-CM | POA: Insufficient documentation

## 2017-04-01 DIAGNOSIS — Z09 Encounter for follow-up examination after completed treatment for conditions other than malignant neoplasm: Secondary | ICD-10-CM | POA: Insufficient documentation

## 2017-04-01 DIAGNOSIS — K640 First degree hemorrhoids: Secondary | ICD-10-CM | POA: Diagnosis not present

## 2017-04-01 DIAGNOSIS — F329 Major depressive disorder, single episode, unspecified: Secondary | ICD-10-CM | POA: Insufficient documentation

## 2017-04-01 DIAGNOSIS — E7800 Pure hypercholesterolemia, unspecified: Secondary | ICD-10-CM | POA: Diagnosis not present

## 2017-04-01 DIAGNOSIS — K5730 Diverticulosis of large intestine without perforation or abscess without bleeding: Secondary | ICD-10-CM | POA: Diagnosis not present

## 2017-04-01 HISTORY — PX: COLONOSCOPY WITH PROPOFOL: SHX5780

## 2017-04-01 LAB — GLUCOSE, CAPILLARY: Glucose-Capillary: 112 mg/dL — ABNORMAL HIGH (ref 65–99)

## 2017-04-01 SURGERY — COLONOSCOPY WITH PROPOFOL
Anesthesia: General

## 2017-04-01 MED ORDER — PROPOFOL 500 MG/50ML IV EMUL
INTRAVENOUS | Status: DC | PRN
  Administered 2017-04-01: 150 ug/kg/min via INTRAVENOUS

## 2017-04-01 MED ORDER — PROPOFOL 500 MG/50ML IV EMUL
INTRAVENOUS | Status: AC
Start: 2017-04-01 — End: 2017-04-01
  Filled 2017-04-01: qty 50

## 2017-04-01 MED ORDER — PROPOFOL 10 MG/ML IV BOLUS
INTRAVENOUS | Status: AC
  Filled 2017-04-01: qty 20

## 2017-04-01 MED ORDER — LIDOCAINE HCL (PF) 2 % IJ SOLN
INTRAMUSCULAR | Status: AC
Start: 2017-04-01 — End: 2017-04-01
  Filled 2017-04-01: qty 10

## 2017-04-01 MED ORDER — SODIUM CHLORIDE 0.9 % IV SOLN: INTRAVENOUS | Status: DC

## 2017-04-01 MED ORDER — LIDOCAINE HCL (CARDIAC) 20 MG/ML IV SOLN
INTRAVENOUS | Status: DC | PRN
  Administered 2017-04-01: 50 mg via INTRAVENOUS

## 2017-04-01 MED ORDER — SODIUM CHLORIDE 0.9 % IV SOLN
INTRAVENOUS | Status: DC
  Administered 2017-04-01: 1000 mL via INTRAVENOUS

## 2017-04-01 MED ORDER — PROPOFOL 10 MG/ML IV BOLUS
INTRAVENOUS | Status: DC | PRN
  Administered 2017-04-01 (×3): 50 mg via INTRAVENOUS

## 2017-04-01 NOTE — Op Note (Signed)
[facility] Patient Name: Casey French Procedure Date: 04/01/2017 9:43 AM MRN: [id number] Account #: [id number] Date of Birth: [birth year] Admit Type: Outpatient Age: 67 Room: [facility] ENDO ROOM 3 Gender: Male Note Status: Finalized Procedure:            Colonoscopy Indications:          Follow-up of diverticulitis Providers:            Lollie Sails, MD Referring MD:         Irven Easterly. Kary Kos, MD (Referring MD) Medicines:            Monitored Anesthesia Care Complications:        No immediate complications. Procedure:            Pre-Anesthesia Assessment:                       - ASA Grade Assessment: III - A patient with severe                        systemic disease.                       After obtaining informed consent, the colonoscope was                        passed under direct vision. Throughout the procedure,                        the patient's blood pressure, pulse, and oxygen                        saturations were monitored continuously. The Olympus                        PCF-H180AL colonoscope ( S#: [id number] ) was introduced                        through the anus and advanced to the the cecum,                        identified by appendiceal orifice and ileocecal valve.                        The colonoscopy was performed with moderate difficulty                        due to a tortuous colon. Successful completion of the                        procedure was aided by changing the patient to a supine                        position, changing the patient to a prone position and                        using manual pressure. The patient tolerated the                        procedure well. The quality of the bowel preparation  was fair. Findings:      Two sessile polyps were found in the rectum. The polyps were 2 mm in       size. These polyps were removed with a cold biopsy forceps. Resection       and  retrieval were complete.      Many small to medium-mouthed diverticula were found in the sigmoid       colon, descending colon and transverse colon. Of note, the sigmoid colon       seems very redundant, some petechiae noted in the distal to mid sigmoid       without overt evidence of inflammation otherwise.      Non-bleeding external and internal hemorrhoids were found during       retroflexion, during perianal exam and during anoscopy. The hemorrhoids       were medium-sized and Grade II (internal hemorrhoids that prolapse but       reduce spontaneously). Impression:           - Preparation of the colon was fair.                       - Two 2 mm polyps in the rectum, removed with a cold                        biopsy forceps. Resected and retrieved.                       - Diverticulosis in the sigmoid colon, in the                        descending colon and in the transverse colon.                       - Non-bleeding external and internal hemorrhoids. Recommendation:       - Refer to a colo-rectal surgeon at appointment to be                        scheduled. Procedure Code(s):    --- Professional ---                       [phone], Colonoscopy, flexible; with biopsy, single or                        multiple Diagnosis Code(s):    --- Professional ---                       K64.1, Second degree hemorrhoids                       K62.1, Rectal polyp                       K57.32, Diverticulitis of large intestine without                        perforation or abscess without bleeding                       K57.30, Diverticulosis of large intestine without  perforation or abscess without bleeding CPT copyright 2016 American Medical Association. All rights reserved. The codes documented in this report are preliminary and upon coder review may  be revised to meet current compliance requirements. Lollie Sails, MD 04/01/2017 10:32:01 AM This report has been signed  electronically. Number of Addenda: 0 Note Initiated On: 04/01/2017 9:43 AM Scope Withdrawal Time: 0 hours 5 minutes 58 seconds  Total Procedure Duration: 0 hours 35 minutes 14 seconds       [facility]

## 2017-04-01 NOTE — Anesthesia Postprocedure Evaluation (Signed)
Anesthesia Post Note  Patient: Casey French  Procedure(s) Performed: COLONOSCOPY WITH PROPOFOL (N/A )  Patient location during evaluation: Endoscopy Anesthesia Type: General Level of consciousness: awake and alert, oriented and patient cooperative Pain management: pain level controlled Vital Signs Assessment: post-procedure vital signs reviewed and stable Respiratory status: spontaneous breathing and respiratory function stable Cardiovascular status: blood pressure returned to baseline and stable Postop Assessment: no headache, no backache, patient able to bend at knees, no apparent nausea or vomiting and adequate PO intake Anesthetic complications: no     Last Vitals:  Vitals:   04/01/17 0911 04/01/17 1030  BP: 133/89 (!) 95/55  Pulse: 87 90  Resp: 20 (!) 24  Temp: (!) 35.9 C (!) 36.2 C  SpO2: 99% 98%    Last Pain:  Vitals:   04/01/17 1030  TempSrc: Tympanic                 Margrete Delude H Lashina Milles

## 2017-04-01 NOTE — Transfer of Care (Signed)
Immediate Anesthesia Transfer of Care Note  Patient: Casey French  Procedure(s) Performed: COLONOSCOPY WITH PROPOFOL (N/A )  Patient Location: PACU  Anesthesia Type:General  Level of Consciousness: drowsy and patient cooperative  Airway & Oxygen Therapy: Patient Spontanous Breathing  Post-op Assessment: Report given to RN, Post -op Vital signs reviewed and stable and Patient moving all extremities X 4  Post vital signs: Reviewed and stable  Last Vitals:  Vitals:   04/01/17 0911 04/01/17 1030  BP: 133/89 (!) 95/55  Pulse: 87 90  Resp: 20 (!) 24  Temp: (!) 35.9 C (!) 36.2 C  SpO2: 99% 98%    Last Pain:  Vitals:   04/01/17 1030  TempSrc: Tympanic         Complications: No apparent anesthesia complications

## 2017-04-01 NOTE — Anesthesia Preprocedure Evaluation (Signed)
Anesthesia Evaluation  Patient identified by MRN, date of birth, ID band Patient awake    Reviewed: Allergy & Precautions, H&P , NPO status , Patient's Chart, lab work & pertinent test results, reviewed documented beta blocker date and time   History of Anesthesia Complications Negative for: history of anesthetic complications  Airway Mallampati: I  TM Distance: >3 FB Neck ROM: full    Dental  (+) Upper Dentures, Lower Dentures   Pulmonary neg pulmonary ROS, former smoker,           Cardiovascular Exercise Tolerance: Good hypertension, (-) angina+ CAD, + Past MI and + Cardiac Stents  (-) CABG (-) dysrhythmias + Valvular Problems/Murmurs      Neuro/Psych PSYCHIATRIC DISORDERS Depression negative neurological ROS     GI/Hepatic Neg liver ROS, GERD  ,  Endo/Other  diabetes, Type 2, Oral Hypoglycemic Agents  Renal/GU negative Renal ROS  negative genitourinary   Musculoskeletal   Abdominal   Peds  Hematology negative hematology ROS (+)   Anesthesia Other Findings Past Medical History: No date: Depression No date: Diabetes mellitus without complication (HCC) No date: Gout No date: High cholesterol No date: Hypertension No date: Myocardial infarction ([place])   Reproductive/Obstetrics negative OB ROS                             Anesthesia Physical Anesthesia Plan  ASA: III  Anesthesia Plan: General   Post-op Pain Management:    Induction: Intravenous  PONV Risk Score and Plan: 2 and Propofol infusion  Airway Management Planned: Nasal Cannula  Additional Equipment:   Intra-op Plan:   Post-operative Plan:   Informed Consent: I have reviewed the patients History and Physical, chart, labs and discussed the procedure including the risks, benefits and alternatives for the proposed anesthesia with the patient or authorized representative who has indicated his/her understanding and  acceptance.   Dental Advisory Given  Plan Discussed with: Anesthesiologist, CRNA and Surgeon  Anesthesia Plan Comments:         Anesthesia Quick Evaluation

## 2017-04-01 NOTE — Addendum Note (Signed)
Addendum  created 04/01/17 1134 by Martha Clan, MD   [place] Attestations filed

## 2017-04-01 NOTE — H&P (Signed)
Outpatient short stay form Pre-procedure 04/01/2017 9:26 AM Casey Sails MD  Primary Physician: Dr. [name]  Reason for visit: Colonoscopy  History of present illness: Patient is a 67 year old male presenting as above.  He has a history of recurrent diverticulitis.  His last episode was about 2 months ago.  Currently he is asymptomatic.  He tolerated his prep well.  He takes both Eliquis and a 81 mg aspirin these have been held for at least 3-4 days.    Current Facility-Administered Medications:  .  0.9 %  sodium chloride infusion, , Intravenous, Continuous, Casey Sails, MD, Last Rate: 20 mL/hr at 04/01/17 0923, 1,000 mL at 04/01/17 0923 .  0.9 %  sodium chloride infusion, , Intravenous, Continuous, Casey Sails, MD  Medications Prior to Admission  Medication Sig Dispense Refill Last Dose  . allopurinol (ZYLOPRIM) 100 MG tablet Take 100 mg by mouth daily.   03/31/2017 at Unknown time  . aspirin EC 81 MG tablet Take 81 mg by mouth daily.   03/31/2017 at Unknown time  . aspirin-acetaminophen-caffeine (EXCEDRIN MIGRAINE) 250-250-65 MG tablet Take 1 tablet by mouth every 6 (six) hours as needed for headache.   Past Week at Unknown time  . ELIQUIS 5 MG TABS tablet Take 5 mg by mouth daily.  11 Past Week at Unknown time  . gabapentin (NEURONTIN) 300 MG capsule Take 300 mg by mouth at bedtime.  11 Past Week at Unknown time  . hydrochlorothiazide (HYDRODIURIL) 12.5 MG tablet Take 37.5 mg by mouth daily.    03/31/2017 at Unknown time  . isosorbide mononitrate (IMDUR) 60 MG 24 hr tablet Take 60 mg by mouth daily.   03/31/2017 at Unknown time  . lisinopril (ZESTRIL) 5 MG tablet Take 1 tablet (5 mg total) by mouth daily. (Patient taking differently: Take 10 mg by mouth daily. ) 30 tablet 0 03/31/2017 at Unknown time  . metFORMIN (GLUCOPHAGE-XR) 500 MG 24 hr tablet Take 500 mg by mouth 2 (two) times daily.    03/31/2017 at Unknown time  . metoprolol succinate (TOPROL-XL) 100 MG 24 hr tablet  Take 100 mg by mouth daily.   03/31/2017 at Unknown time  . nitroGLYCERIN (NITROSTAT) 0.4 MG SL tablet Place 0.4 mg under the tongue every 5 (five) minutes as needed for chest pain.   Past Week at Unknown time  . omeprazole (PRILOSEC) 20 MG capsule Take 20 mg by mouth daily.   03/31/2017 at Unknown time  . ondansetron (ZOFRAN ODT) 4 MG disintegrating tablet Take 1 tablet (4 mg total) by mouth every 8 (eight) hours as needed for nausea or vomiting. 20 tablet 0 Past Week at Unknown time  . rosuvastatin (CRESTOR) 20 MG tablet Take 20 mg by mouth daily.   03/31/2017 at Unknown time  . oxyCODONE-acetaminophen (ROXICET) 5-325 MG tablet Take 1 tablet by mouth every 6 (six) hours as needed for severe pain. (Patient not taking: Reported on 11/10/2016) 11 tablet 0 Not Taking at Unknown time  . oxyCODONE-acetaminophen (ROXICET) 5-325 MG tablet Take 1 tablet by mouth every 6 (six) hours as needed. (Patient not taking: Reported on 04/01/2017) 12 tablet 0 Not Taking at Unknown time     Allergies  Allergen Reactions  . Morphine And Related Anxiety     Past Medical History:  Diagnosis Date  . Depression   . Diabetes mellitus without complication ([place])   . Gout   . High cholesterol   . Hypertension   . Myocardial infarction ([place])  Review of systems:      Physical Exam    Heart and lungs: Regular rate and rhythm without rub or gallop, lungs are bilaterally clear.    HEENT: Normocephalic atraumatic eyes are anicteric    Other:    Pertinant exam for procedure: Soft nontender nondistended bowel sounds positive normoactive    Planned proceedures: Colonoscopy and indicated procedures. I have discussed the risks benefits and complications of procedures to include not limited to bleeding, infection, perforation and the risk of sedation and the patient wishes to proceed.    Casey Sails, MD Gastroenterology 04/01/2017  9:26 AM

## 2017-04-01 NOTE — Anesthesia Post-op Follow-up Note (Signed)
Anesthesia QCDR form completed.        

## 2017-04-04 ENCOUNTER — Encounter: Payer: Self-pay | Admitting: Gastroenterology

## 2017-04-04 LAB — SURGICAL PATHOLOGY

## 2018-06-26 ENCOUNTER — Emergency Department: Payer: Medicare Other

## 2018-06-26 ENCOUNTER — Other Ambulatory Visit: Payer: Self-pay

## 2018-06-26 ENCOUNTER — Emergency Department
Admission: EM | Admit: 2018-06-26 | Discharge: 2018-06-26 | Disposition: A | Discharge status: Home / Self Care | Payer: Medicare Other | Attending: Emergency Medicine | Admitting: Emergency Medicine

## 2018-06-26 ENCOUNTER — Encounter: Payer: Self-pay | Admitting: Emergency Medicine

## 2018-06-26 DIAGNOSIS — Z87891 Personal history of nicotine dependence: Secondary | ICD-10-CM | POA: Diagnosis not present

## 2018-06-26 DIAGNOSIS — I2510 Atherosclerotic heart disease of native coronary artery without angina pectoris: Secondary | ICD-10-CM | POA: Diagnosis not present

## 2018-06-26 DIAGNOSIS — Z7982 Long term (current) use of aspirin: Secondary | ICD-10-CM | POA: Insufficient documentation

## 2018-06-26 DIAGNOSIS — Z7901 Long term (current) use of anticoagulants: Secondary | ICD-10-CM | POA: Insufficient documentation

## 2018-06-26 DIAGNOSIS — K5732 Diverticulitis of large intestine without perforation or abscess without bleeding: Secondary | ICD-10-CM | POA: Insufficient documentation

## 2018-06-26 DIAGNOSIS — K5792 Diverticulitis of intestine, part unspecified, without perforation or abscess without bleeding: Secondary | ICD-10-CM

## 2018-06-26 DIAGNOSIS — I10 Essential (primary) hypertension: Secondary | ICD-10-CM | POA: Diagnosis not present

## 2018-06-26 DIAGNOSIS — E119 Type 2 diabetes mellitus without complications: Secondary | ICD-10-CM | POA: Diagnosis not present

## 2018-06-26 DIAGNOSIS — Z79899 Other long term (current) drug therapy: Secondary | ICD-10-CM | POA: Diagnosis not present

## 2018-06-26 DIAGNOSIS — R0789 Other chest pain: Secondary | ICD-10-CM | POA: Diagnosis present

## 2018-06-26 LAB — COMPREHENSIVE METABOLIC PANEL
ALT: 15 U/L (ref 0–44)
AST: 20 U/L (ref 15–41)
Albumin: 3.1 g/dL — ABNORMAL LOW (ref 3.5–5.0)
Alkaline Phosphatase: 68 U/L (ref 38–126)
Anion gap: 15 (ref 5–15)
BUN: 30 mg/dL — ABNORMAL HIGH (ref 8–23)
CO2: 19 mmol/L — ABNORMAL LOW (ref 22–32)
Calcium: 8.9 mg/dL (ref 8.9–10.3)
Chloride: 102 mmol/L (ref 98–111)
Creatinine, Ser: 1.81 mg/dL — ABNORMAL HIGH (ref 0.61–1.24)
GFR calc Af Amer: 44 mL/min — ABNORMAL LOW (ref >60)
GFR calc non Af Amer: 38 mL/min — ABNORMAL LOW (ref >60)
Glucose, Bld: 121 mg/dL — ABNORMAL HIGH (ref 70–99)
Potassium: 2.9 mmol/L — ABNORMAL LOW (ref 3.5–5.1)
Sodium: 136 mmol/L (ref 135–145)
Total Bilirubin: 1.2 mg/dL (ref 0.3–1.2)
Total Protein: 7.3 g/dL (ref 6.5–8.1)

## 2018-06-26 LAB — TROPONIN I: Troponin I: <0.03 ng/mL (ref <0.03)

## 2018-06-26 LAB — CBC
HCT: 32.9 % — ABNORMAL LOW (ref 39.0–52.0)
Hemoglobin: 11.3 g/dL — ABNORMAL LOW (ref 13.0–17.0)
MCH: 30.6 pg (ref 26.0–34.0)
MCHC: 34.3 g/dL (ref 30.0–36.0)
MCV: 89.2 fL (ref 80.0–100.0)
Platelets: 262 10*3/uL (ref 150–400)
RBC: 3.69 MIL/uL — ABNORMAL LOW (ref 4.22–5.81)
RDW: 12.9 % (ref 11.5–15.5)
WBC: 17.4 10*3/uL — ABNORMAL HIGH (ref 4.0–10.5)
nRBC: 0 % (ref 0.0–0.2)

## 2018-06-26 MED ORDER — SODIUM CHLORIDE 0.9 % IV SOLN
1000.0000 mL | Freq: Once | INTRAVENOUS | Status: AC
  Administered 2018-06-26: 1000 mL via INTRAVENOUS

## 2018-06-26 MED ORDER — METRONIDAZOLE 500 MG PO TABS
500.0000 mg | ORAL_TABLET | Freq: Once | ORAL | Status: AC
  Administered 2018-06-26: 500 mg via ORAL
  Filled 2018-06-26: qty 1

## 2018-06-26 MED ORDER — TRAMADOL HCL 50 MG PO TABS: 50.0000 mg | ORAL_TABLET | Freq: Four times a day (QID) | ORAL | 0 refills | Status: DC | PRN

## 2018-06-26 MED ORDER — METRONIDAZOLE 500 MG PO TABS: 500.0000 mg | ORAL_TABLET | Freq: Two times a day (BID) | ORAL | 0 refills | Status: DC

## 2018-06-26 MED ORDER — CIPROFLOXACIN HCL 500 MG PO TABS
500.0000 mg | ORAL_TABLET | Freq: Once | ORAL | Status: AC
  Administered 2018-06-26: 12:00:00 500 mg via ORAL
  Filled 2018-06-26: qty 1

## 2018-06-26 MED ORDER — SODIUM CHLORIDE 0.9% FLUSH: 3.0000 mL | Freq: Once | INTRAVENOUS | Status: DC

## 2018-06-26 MED ORDER — ONDANSETRON HCL 4 MG/2ML IJ SOLN
INTRAMUSCULAR | Status: AC
  Filled 2018-06-26: qty 2

## 2018-06-26 MED ORDER — IOHEXOL 300 MG/ML  SOLN
75.0000 mL | Freq: Once | INTRAMUSCULAR | Status: AC | PRN
  Administered 2018-06-26: 75 mL via INTRAVENOUS

## 2018-06-26 MED ORDER — ONDANSETRON HCL 4 MG/2ML IJ SOLN
4.0000 mg | Freq: Once | INTRAMUSCULAR | Status: AC
  Administered 2018-06-26: 4 mg via INTRAVENOUS

## 2018-06-26 MED ORDER — CIPROFLOXACIN HCL 500 MG PO TABS: 500.0000 mg | ORAL_TABLET | Freq: Two times a day (BID) | ORAL | 0 refills | Status: DC

## 2018-06-26 NOTE — ED Notes (Signed)
This RN spoke with spouse per pt request about discharge instructions. Pt will wait in lobby for her ride, aprox 42min per spouse. PT in NAD

## 2018-06-26 NOTE — Discharge Instructions (Addendum)
Please return to the ED immediately if your pain worsens or if you develop fever chills nausea or vomiting

## 2018-06-26 NOTE — ED Notes (Signed)
Report chest pain decreased to 1/10. Cyt abd completed, awaiting results and disposition. Vss. Safety maintained.

## 2018-06-26 NOTE — ED Provider Notes (Signed)
[facility] Emergency Department Provider Note   ____________________________________________    I have reviewed the triage vital signs and the nursing notes.   HISTORY  Chief Complaint Chest Pain     HPI LEKEITH French is a 68 y.o. male with history of diabetes who presents with complaints of abdominal pain, mild nausea and some loose stools.  He also reports this morning he had some chest tightness.  Patient reports symptoms started several days ago primarily with nausea and feeling sick.  3 days ago he vomited and felt somewhat better and has been doing well this weekend until he started to feel sick again.  Denies myalgias fevers or chills.   Past Medical History:  Diagnosis Date  . Depression   . Diabetes mellitus without complication ([place])   . Gout   . High cholesterol   . Hypertension   . Myocardial infarction [facility])     Patient Active Problem List   Diagnosis Date Noted  . Unstable angina ([place]) 11/10/2016  . Angina at rest [facility]) 05/01/2015  . Hypertensive urgency 06/14/2014  . Diverticulitis 06/14/2014  . AKI (acute kidney injury) ([place]) 06/14/2014  . Benign essential HTN 06/14/2014  . Hyperlipidemia 06/14/2014  . DM type 2 (diabetes mellitus, type 2) ([place]) 06/14/2014  . CAD (coronary artery disease) 06/14/2014  . GERD without esophagitis 06/14/2014  . Closed head injury 03/24/2013    Past Surgical History:  Procedure Laterality Date  . CARDIAC CATHETERIZATION    . COLONOSCOPY WITH PROPOFOL N/A 04/01/2017   Procedure: COLONOSCOPY WITH PROPOFOL;  Surgeon: Lollie Sails, MD;  Location: [facility] ENDOSCOPY;  Service: Endoscopy;  Laterality: N/A;  . right hand surgery     3 screws in right hand    Prior to Admission medications   Medication Sig Start Date End Date Taking? Authorizing Provider  allopurinol (ZYLOPRIM) 100 MG tablet Take 100 mg by mouth daily.    [provider]  aspirin EC 81 MG tablet Take 81 mg by  mouth daily.    [provider]  aspirin-acetaminophen-caffeine (EXCEDRIN MIGRAINE) [phone] MG tablet Take 1 tablet by mouth every 6 (six) hours as needed for headache.    [provider]  ciprofloxacin (CIPRO) 500 MG tablet Take 1 tablet (500 mg total) by mouth 2 (two) times daily. 06/26/18   Lavonia Drafts, MD  ELIQUIS 5 MG TABS tablet Take 5 mg by mouth daily. 08/16/16   [provider]  gabapentin (NEURONTIN) 300 MG capsule Take 300 mg by mouth at bedtime. 09/25/16   [provider]  hydrochlorothiazide (HYDRODIURIL) 12.5 MG tablet Take 37.5 mg by mouth daily.     [provider]  isosorbide mononitrate (IMDUR) 60 MG 24 hr tablet Take 60 mg by mouth daily.    [provider]  lisinopril (ZESTRIL) 5 MG tablet Take 1 tablet (5 mg total) by mouth daily. Patient taking differently: Take 10 mg by mouth daily.  06/16/14   Nicholes Mango, MD  metFORMIN (GLUCOPHAGE-XR) 500 MG 24 hr tablet Take 500 mg by mouth 2 (two) times daily.     [provider]  metoprolol succinate (TOPROL-XL) 100 MG 24 hr tablet Take 100 mg by mouth daily.    [provider]  metroNIDAZOLE (FLAGYL) 500 MG tablet Take 1 tablet (500 mg total) by mouth 2 (two) times daily after a meal. 06/26/18   Lavonia Drafts, MD  nitroGLYCERIN (NITROSTAT) 0.4 MG SL tablet Place 0.4 mg under the tongue every 5 (  five) minutes as needed for chest pain.    [provider]  omeprazole (PRILOSEC) 20 MG capsule Take 20 mg by mouth daily.    [provider]  ondansetron (ZOFRAN ODT) 4 MG disintegrating tablet Take 1 tablet (4 mg total) by mouth every 8 (eight) hours as needed for nausea or vomiting. 10/16/16   Darel Hong, MD  oxyCODONE-acetaminophen (ROXICET) 5-325 MG tablet Take 1 tablet by mouth every 6 (six) hours as needed for severe pain. Patient not taking: Reported on 11/10/2016 10/16/16   Darel Hong, MD  oxyCODONE-acetaminophen (ROXICET) 5-325 MG tablet  Take 1 tablet by mouth every 6 (six) hours as needed. Patient not taking: Reported on 04/01/2017 01/13/17   Loney Hering, MD  rosuvastatin (CRESTOR) 20 MG tablet Take 20 mg by mouth daily.    [provider]  traMADol (ULTRAM) 50 MG tablet Take 1 tablet (50 mg total) by mouth every 6 (six) hours as needed. 06/26/18 06/26/19  Lavonia Drafts, MD     Allergies Morphine and related  Family History  Problem Relation Age of Onset  . CAD Other     Social History Social History   Tobacco Use  . Smoking status: Former Smoker    Last attempt to quit: 03/24/2001    Years since quitting: 17.2  . Smokeless tobacco: Never Used  Substance Use Topics  . Alcohol use: No    Alcohol/week: 0.0 standard drinks  . Drug use: No    Review of Systems  Constitutional: No fever/chills Eyes: No visual changes.  ENT: No sore throat. Cardiovascular: As above Respiratory: No shortness of breath or cough Gastrointestinal: As above Genitourinary: Negative for dysuria. Musculoskeletal: Negative for back pain. Skin: Negative for rash. Neurological: Negative for headaches or weakness   ____________________________________________   PHYSICAL EXAM:  VITAL SIGNS: ED Triage Vitals  Enc Vitals Group     BP 06/26/18 0926 (!) 176/102     Pulse Rate 06/26/18 0926 98     Resp 06/26/18 0926 16     Temp 06/26/18 0930 98.3 F (36.8 C)     Temp Source 06/26/18 0930 Oral     SpO2 06/26/18 0926 100 %     Weight --      Height --      Head Circumference --      Peak Flow --      Pain Score 06/26/18 0926 5     Pain Loc --      Pain Edu? --      Excl. in [place]? --     Constitutional: Alert and oriented.  Eyes: Conjunctivae are normal.   Nose: No congestion/rhinnorhea.  Cardiovascular: Normal rate, regular rhythm. Grossly normal heart sounds.  Good peripheral circulation. Respiratory: Normal respiratory effort.  No retractions. Lungs CTAB. Gastrointestinal: Tenderness palpation left lower  quadrant of the abdomen, distention  Musculoskeletal: Warm and well perfused Neurologic:  Normal speech and language. No gross focal neurologic deficits are appreciated.  Skin:  Skin is warm, dry and intact. No rash noted. Psychiatric: Mood and affect are normal. Speech and behavior are normal.  ____________________________________________   LABS (all labs ordered are listed, but only abnormal results are displayed)  Labs Reviewed  COMPREHENSIVE METABOLIC PANEL - Abnormal; Notable for the following components:      Result Value   Potassium 2.9 (*)    CO2 19 (*)    Glucose, Bld 121 (*)    BUN 30 (*)    Creatinine, Ser 1.81 (*)  Albumin 3.1 (*)    GFR calc non Af Amer 38 (*)    GFR calc Af Amer 44 (*)    All other components within normal limits  CBC - Abnormal; Notable for the following components:   WBC 17.4 (*)    RBC 3.69 (*)    Hemoglobin 11.3 (*)    HCT 32.9 (*)    All other components within normal limits  TROPONIN I   ____________________________________________  EKG  None ____________________________________________  RADIOLOGY  Chest x-ray unremarkable CT scan consistent with acute left sigmoid diverticulitis, no perforation or abscess ____________________________________________   PROCEDURES  Procedure(s) performed: No  Procedures   Critical Care performed: No ____________________________________________   INITIAL IMPRESSION / ASSESSMENT AND PLAN / ED COURSE  Pertinent labs & imaging results that were available during my care of the patient were reviewed by me and considered in my medical decision making (see chart for details).  Patient presents with primarily abdominal discomfort with decreased p.o. intake and some loose stools with nausea but also reports some chest discomfort this morning.  EKG and troponin are reassuring.  Patient is elevated white blood cell count, will obtain CT of the abdomen, suspicious for diverticulitis  CT confirms  diverticulitis, no abscess or perforation.  Recommend to the patient that he be admitted to the hospital for IV antibiotics however he would strongly like to go home on p.o. antibiotics.  I did detail to him that the risk of going home is much higher especially given his elevated white blood cell count but he states that he will return promptly if any worsening symptoms    ____________________________________________   FINAL CLINICAL IMPRESSION(S) / ED DIAGNOSES  Final diagnoses:  Diverticulitis        Note:  This document was prepared using Dragon voice recognition software and may include unintentional dictation errors.   Lavonia Drafts, MD 06/26/18 1250

## 2018-06-26 NOTE — ED Triage Notes (Signed)
Pt from home via EMS with c/o CP and weakness xfew days. Pt had 324mg  of Asprin and 3 nitro PTA. Per EMS pt NSR on 12lead and VSS. PT in NAD

## 2018-12-10 ENCOUNTER — Encounter: Payer: Self-pay | Admitting: Emergency Medicine

## 2018-12-10 ENCOUNTER — Emergency Department: Payer: Medicare Other

## 2018-12-10 ENCOUNTER — Emergency Department
Admission: EM | Admit: 2018-12-10 | Discharge: 2018-12-11 | Disposition: A | Discharge status: Home / Self Care | Payer: Medicare Other | Attending: Emergency Medicine | Admitting: Emergency Medicine

## 2018-12-10 ENCOUNTER — Other Ambulatory Visit: Payer: Self-pay

## 2018-12-10 DIAGNOSIS — Z7901 Long term (current) use of anticoagulants: Secondary | ICD-10-CM | POA: Insufficient documentation

## 2018-12-10 DIAGNOSIS — K5792 Diverticulitis of intestine, part unspecified, without perforation or abscess without bleeding: Secondary | ICD-10-CM | POA: Diagnosis not present

## 2018-12-10 DIAGNOSIS — Z87891 Personal history of nicotine dependence: Secondary | ICD-10-CM | POA: Diagnosis not present

## 2018-12-10 DIAGNOSIS — Z79899 Other long term (current) drug therapy: Secondary | ICD-10-CM | POA: Insufficient documentation

## 2018-12-10 DIAGNOSIS — R1030 Lower abdominal pain, unspecified: Secondary | ICD-10-CM | POA: Diagnosis present

## 2018-12-10 DIAGNOSIS — I10 Essential (primary) hypertension: Secondary | ICD-10-CM | POA: Insufficient documentation

## 2018-12-10 DIAGNOSIS — R1031 Right lower quadrant pain: Secondary | ICD-10-CM | POA: Diagnosis not present

## 2018-12-10 DIAGNOSIS — I252 Old myocardial infarction: Secondary | ICD-10-CM | POA: Diagnosis not present

## 2018-12-10 DIAGNOSIS — I2510 Atherosclerotic heart disease of native coronary artery without angina pectoris: Secondary | ICD-10-CM | POA: Insufficient documentation

## 2018-12-10 DIAGNOSIS — E119 Type 2 diabetes mellitus without complications: Secondary | ICD-10-CM | POA: Insufficient documentation

## 2018-12-10 DIAGNOSIS — R1032 Left lower quadrant pain: Secondary | ICD-10-CM | POA: Diagnosis not present

## 2018-12-10 DIAGNOSIS — Z7984 Long term (current) use of oral hypoglycemic drugs: Secondary | ICD-10-CM | POA: Diagnosis not present

## 2018-12-10 LAB — URINALYSIS, COMPLETE (UACMP) WITH MICROSCOPIC
Bacteria, UA: NONE SEEN
Bilirubin Urine: NEGATIVE
Glucose, UA: 150 mg/dL — AB
Hgb urine dipstick: NEGATIVE
Ketones, ur: NEGATIVE mg/dL
Leukocytes,Ua: NEGATIVE
Nitrite: NEGATIVE
Protein, ur: >=300 mg/dL — AB
Specific Gravity, Urine: 1.024 (ref 1.005–1.030)
pH: 6 (ref 5.0–8.0)

## 2018-12-10 LAB — COMPREHENSIVE METABOLIC PANEL
ALT: 13 U/L (ref 0–44)
AST: 16 U/L (ref 15–41)
Albumin: 3 g/dL — ABNORMAL LOW (ref 3.5–5.0)
Alkaline Phosphatase: 64 U/L (ref 38–126)
Anion gap: 10 (ref 5–15)
BUN: 28 mg/dL — ABNORMAL HIGH (ref 8–23)
CO2: 22 mmol/L (ref 22–32)
Calcium: 8.8 mg/dL — ABNORMAL LOW (ref 8.9–10.3)
Chloride: 106 mmol/L (ref 98–111)
Creatinine, Ser: 1.82 mg/dL — ABNORMAL HIGH (ref 0.61–1.24)
GFR calc Af Amer: 44 mL/min — ABNORMAL LOW (ref >60)
GFR calc non Af Amer: 38 mL/min — ABNORMAL LOW (ref >60)
Glucose, Bld: 129 mg/dL — ABNORMAL HIGH (ref 70–99)
Potassium: 3.6 mmol/L (ref 3.5–5.1)
Sodium: 138 mmol/L (ref 135–145)
Total Bilirubin: 0.4 mg/dL (ref 0.3–1.2)
Total Protein: 6.9 g/dL (ref 6.5–8.1)

## 2018-12-10 LAB — CBC
HCT: 33 % — ABNORMAL LOW (ref 39.0–52.0)
Hemoglobin: 11.1 g/dL — ABNORMAL LOW (ref 13.0–17.0)
MCH: 30.6 pg (ref 26.0–34.0)
MCHC: 33.6 g/dL (ref 30.0–36.0)
MCV: 90.9 fL (ref 80.0–100.0)
Platelets: 253 10*3/uL (ref 150–400)
RBC: 3.63 MIL/uL — ABNORMAL LOW (ref 4.22–5.81)
RDW: 12.7 % (ref 11.5–15.5)
WBC: 16.4 10*3/uL — ABNORMAL HIGH (ref 4.0–10.5)
nRBC: 0 % (ref 0.0–0.2)

## 2018-12-10 LAB — LIPASE, BLOOD: Lipase: 30 U/L (ref 11–51)

## 2018-12-10 MED ORDER — HYDROCODONE-ACETAMINOPHEN 5-325 MG PO TABS: 1.0000 | ORAL_TABLET | ORAL | 0 refills | Status: AC | PRN

## 2018-12-10 MED ORDER — SODIUM CHLORIDE 0.9 % IV BOLUS
500.0000 mL | Freq: Once | INTRAVENOUS | Status: AC
  Administered 2018-12-10: 21:00:00 500 mL via INTRAVENOUS

## 2018-12-10 MED ORDER — METRONIDAZOLE IN NACL 5-0.79 MG/ML-% IV SOLN
500.0000 mg | Freq: Once | INTRAVENOUS | Status: AC
  Administered 2018-12-11: 500 mg via INTRAVENOUS
  Filled 2018-12-10: qty 100

## 2018-12-10 MED ORDER — IOHEXOL 300 MG/ML  SOLN
80.0000 mL | Freq: Once | INTRAMUSCULAR | Status: AC | PRN
  Administered 2018-12-10: 22:00:00 100 mL via INTRAVENOUS

## 2018-12-10 MED ORDER — CIPRO 500 MG/5ML (10%) PO SUSR: 500.0000 mg | Freq: Two times a day (BID) | ORAL | 0 refills | Status: DC

## 2018-12-10 MED ORDER — HYDROMORPHONE HCL 1 MG/ML IJ SOLN
0.5000 mg | Freq: Once | INTRAMUSCULAR | Status: AC
  Administered 2018-12-10: 21:00:00 0.5 mg via INTRAVENOUS
  Filled 2018-12-10: qty 1

## 2018-12-10 MED ORDER — ONDANSETRON HCL 4 MG/2ML IJ SOLN
4.0000 mg | Freq: Once | INTRAMUSCULAR | Status: DC
  Filled 2018-12-10: qty 2

## 2018-12-10 MED ORDER — IOHEXOL 9 MG/ML PO SOLN
500.0000 mL | Freq: Two times a day (BID) | ORAL | Status: DC | PRN
  Administered 2018-12-10 (×2): 500 mL via ORAL
  Filled 2018-12-10 (×2): qty 500

## 2018-12-10 MED ORDER — CIPROFLOXACIN IN D5W 400 MG/200ML IV SOLN
400.0000 mg | Freq: Once | INTRAVENOUS | Status: AC
  Administered 2018-12-11: 400 mg via INTRAVENOUS
  Filled 2018-12-10: qty 200

## 2018-12-10 MED ORDER — CIPRO 500 MG/5ML (10%) PO SUSR: 500.0000 mg | Freq: Two times a day (BID) | ORAL | 0 refills | Status: AC

## 2018-12-10 MED ORDER — METRONIDAZOLE 500 MG PO TABS: 500.0000 mg | ORAL_TABLET | Freq: Two times a day (BID) | ORAL | 0 refills | Status: AC

## 2018-12-10 MED ORDER — METOCLOPRAMIDE HCL 5 MG/ML IJ SOLN
10.0000 mg | Freq: Once | INTRAMUSCULAR | Status: AC
  Administered 2018-12-10: 21:00:00 10 mg via INTRAVENOUS

## 2018-12-10 MED ORDER — ONDANSETRON 8 MG PO TBDP: 8.0000 mg | ORAL_TABLET | Freq: Three times a day (TID) | ORAL | 0 refills | Status: DC | PRN

## 2018-12-10 NOTE — ED Triage Notes (Signed)
FIRST NURSE NOTE:  Pt arrived via w/c from [facility] with reports of diverticulitis flare-up. No distress noted at this time.

## 2018-12-10 NOTE — ED Provider Notes (Signed)
[facility] Emergency Department Provider Note ____________________________________________   First MD Initiated Contact with Patient 12/10/18 1948     (approximate)  I have reviewed the triage vital signs and the nursing notes.   HISTORY  Chief Complaint Diverticulitis and Abdominal Pain    HPI Casey French is a 68 y.o. male with PMH as noted below who presents with lower abdominal pain since yesterday, initially in the left lower quadrant, and now also in the right lower quadrant. He states it feels similar to prior diverticulitis. He has associated vomiting and diarrhea, but no blood in the stool. He denies fever.  Past Medical History:  Diagnosis Date  . Depression   . Diabetes mellitus without complication ([place])   . Gout   . High cholesterol   . Hypertension   . Myocardial infarction [facility])     Patient Active Problem List   Diagnosis Date Noted  . Unstable angina ([place]) 11/10/2016  . Angina at rest [facility]) 05/01/2015  . Hypertensive urgency 06/14/2014  . Diverticulitis 06/14/2014  . AKI (acute kidney injury) ([place]) 06/14/2014  . Benign essential HTN 06/14/2014  . Hyperlipidemia 06/14/2014  . DM type 2 (diabetes mellitus, type 2) ([place]) 06/14/2014  . CAD (coronary artery disease) 06/14/2014  . GERD without esophagitis 06/14/2014  . Closed head injury 03/24/2013    Past Surgical History:  Procedure Laterality Date  . CARDIAC CATHETERIZATION    . COLONOSCOPY WITH PROPOFOL N/A 04/01/2017   Procedure: COLONOSCOPY WITH PROPOFOL;  Surgeon: Lollie Sails, MD;  Location: [facility] ENDOSCOPY;  Service: Endoscopy;  Laterality: N/A;  . right hand surgery     3 screws in right hand    Prior to Admission medications   Medication Sig Start Date End Date Taking? Authorizing Provider  allopurinol (ZYLOPRIM) 100 MG tablet Take 100 mg by mouth daily.    [provider]  aspirin EC 81 MG tablet Take 81 mg by mouth daily.    [provider]  aspirin-acetaminophen-caffeine (EXCEDRIN MIGRAINE) [phone] MG tablet Take 1 tablet by mouth every 6 (six) hours as needed for headache.    [provider]  ciprofloxacin (CIPRO) 500 MG tablet Take 1 tablet (500 mg total) by mouth 2 (two) times daily. 06/26/18   Lavonia Drafts, MD  ciprofloxacin (CIPRO) 500 MG/5ML (10%) suspension Take 5 mLs (500 mg total) by mouth 2 (two) times daily for 10 days. 12/10/18 12/20/18  Arta Silence, MD  ELIQUIS 5 MG TABS tablet Take 5 mg by mouth daily. 08/16/16   [provider]  gabapentin (NEURONTIN) 300 MG capsule Take 300 mg by mouth at bedtime. 09/25/16   [provider]  hydrochlorothiazide (HYDRODIURIL) 12.5 MG tablet Take 37.5 mg by mouth daily.     [provider]  HYDROcodone-acetaminophen (NORCO/VICODIN) 5-325 MG tablet Take 1 tablet by mouth every 4 (four) hours as needed for up to 3 days for severe pain. 12/10/18 12/13/18  Arta Silence, MD  isosorbide mononitrate (IMDUR) 60 MG 24 hr tablet Take 60 mg by mouth daily.    [provider]  lisinopril (ZESTRIL) 5 MG tablet Take 1 tablet (5 mg total) by mouth daily. Patient taking differently: Take 10 mg by mouth daily.  06/16/14   Nicholes Mango, MD  metFORMIN (GLUCOPHAGE-XR) 500 MG 24 hr tablet Take 500 mg by mouth 2 (two) times daily.     [provider]  metoprolol succinate (TOPROL-XL) 100 MG 24 hr tablet Take 100 mg by mouth daily.  [provider]  metroNIDAZOLE (FLAGYL) 500 MG tablet Take 1 tablet (500 mg total) by mouth 2 (two) times daily for 10 days. 12/10/18 12/20/18  Arta Silence, MD  nitroGLYCERIN (NITROSTAT) 0.4 MG SL tablet Place 0.4 mg under the tongue every 5 (five) minutes as needed for chest pain.    [provider]  omeprazole (PRILOSEC) 20 MG capsule Take 20 mg by mouth daily.    [provider]  ondansetron (ZOFRAN ODT) 8 MG disintegrating tablet Take 1 tablet (8 mg total) by  mouth every 8 (eight) hours as needed for nausea or vomiting. 12/10/18   Arta Silence, MD  oxyCODONE-acetaminophen (ROXICET) 5-325 MG tablet Take 1 tablet by mouth every 6 (six) hours as needed for severe pain. Patient not taking: Reported on 11/10/2016 10/16/16   Darel Hong, MD  oxyCODONE-acetaminophen (ROXICET) 5-325 MG tablet Take 1 tablet by mouth every 6 (six) hours as needed. Patient not taking: Reported on 04/01/2017 01/13/17   Loney Hering, MD  rosuvastatin (CRESTOR) 20 MG tablet Take 20 mg by mouth daily.    [provider]  traMADol (ULTRAM) 50 MG tablet Take 1 tablet (50 mg total) by mouth every 6 (six) hours as needed. 06/26/18 06/26/19  Lavonia Drafts, MD    Allergies Morphine and Morphine and related  Family History  Problem Relation Age of Onset  . CAD Other     Social History Social History   Tobacco Use  . Smoking status: Former Smoker    Quit date: 03/24/2001    Years since quitting: 17.7  . Smokeless tobacco: Never Used  Substance Use Topics  . Alcohol use: No    Alcohol/week: 0.0 standard drinks  . Drug use: No    Review of Systems  Constitutional: No fever. Eyes: No redness. ENT: No sore throat. Cardiovascular: Denies chest pain. Respiratory: Denies shortness of breath. Gastrointestinal: Positive for vomiting and diarrhea. Genitourinary: Negative for dysuria.  Musculoskeletal: Negative for back pain. Skin: Negative for rash. Neurological: Negative for headache.   ____________________________________________   PHYSICAL EXAM:  VITAL SIGNS: ED Triage Vitals [12/10/18 1630]  Enc Vitals Group     BP (!) 173/95     Pulse Rate 81     Resp 18     Temp 99 F (37.2 C)     Temp Source Oral     SpO2 96 %     Weight 166 lb (75.3 kg)     Height 5\' 9"  (1.753 m)     Head Circumference      Peak Flow      Pain Score 7     Pain Loc      Pain Edu?      Excl. in [place]?     Constitutional: Alert and oriented. Relatively well  appearing and in no acute distress. Eyes: Conjunctivae are normal. No scleral icterus. Head: Atraumatic. Nose: No congestion/rhinnorhea. Mouth/Throat: Mucous membranes are slightly dry.   Neck: Normal range of motion.  Cardiovascular: Good peripheral circulation. Respiratory: Normal respiratory effort.  No retractions. Gastrointestinal: Soft with mild bilateral lower quadrant tenderness. No distention.  Genitourinary: No flank tenderness. Musculoskeletal: No lower extremity edema.  Extremities warm and well perfused.  Neurologic:  Normal speech and language. No gross focal neurologic deficits are appreciated.  Skin:  Skin is warm and dry. No rash noted. Psychiatric: Mood and affect are normal. Speech and behavior are normal.  ____________________________________________   LABS (all labs ordered are listed, but only abnormal results are displayed)  Labs Reviewed  COMPREHENSIVE METABOLIC PANEL - Abnormal; Notable for the following components:      Result Value   Glucose, Bld 129 (*)    BUN 28 (*)    Creatinine, Ser 1.82 (*)    Calcium 8.8 (*)    Albumin 3.0 (*)    GFR calc non Af Amer 38 (*)    GFR calc Af Amer 44 (*)    All other components within normal limits  CBC - Abnormal; Notable for the following components:   WBC 16.4 (*)    RBC 3.63 (*)    Hemoglobin 11.1 (*)    HCT 33.0 (*)    All other components within normal limits  URINALYSIS, COMPLETE (UACMP) WITH MICROSCOPIC - Abnormal; Notable for the following components:   Color, Urine YELLOW (*)    APPearance CLEAR (*)    Glucose, UA 150 (*)    Protein, ur >=300 (*)    All other components within normal limits  LIPASE, BLOOD   ____________________________________________  EKG   ____________________________________________  RADIOLOGY  CT abdomen: Diverticulitis with no evidence of perforation or abscess  ____________________________________________   PROCEDURES  Procedure(s) performed: No  Procedures   Critical Care performed: No ____________________________________________   INITIAL IMPRESSION / ASSESSMENT AND PLAN / ED COURSE  Pertinent labs & imaging results that were available during my care of the patient were reviewed by me and considered in my medical decision making (see chart for details).  68 year old male with PMH as noted above including a prior history of diverticulitis presents with bilateral lower quadrant abdominal pain with vomiting and diarrhea since yesterday. He states it feels very similar to when he previously had diverticulitis.  I reviewed the past medical records in [place]. The patient was most recently seen in June of this year with diverticulitis diagnosed on CT, with no abscess or perforation. He was discharged with oral antibiotics and states that he did well with this.  On exam today, the patient is overall well-appearing. His vital signs are normal except for hypertension. He does have some bilateral lower abdominal tenderness.  Initial lab work-up reveals elevated WBC count, and creatinine which is elevated but consistent with patient's baseline over the last year.  Overall presentation is most consistent with diverticulitis, although differential also includes colitis, enteritis, or less likely UTI. We will obtain a CT abdomen and reassess.  ----------------------------------------- 11:03 PM on 12/10/2018 -----------------------------------------  CT is consistent with diverticulitis.  There is no evidence of perforation or abscess.  The patient appears comfortable on reassessment.  Given that he has had some vomiting and moderate pain, I offered admission.  However the patient states that he did very well at home after the last episode, and would prefer to be discharged.  I will order a dose of Cipro and Flagyl here before his discharge.  I gave him thorough return precautions and he expressed understanding.  ____________________________________________    FINAL CLINICAL IMPRESSION(S) / ED DIAGNOSES  Final diagnoses:  Diverticulitis      NEW MEDICATIONS STARTED DURING THIS VISIT:  New Prescriptions   CIPROFLOXACIN (CIPRO) 500 MG/5ML (10%) SUSPENSION    Take 5 mLs (500 mg total) by mouth 2 (two) times daily for 10 days.   HYDROCODONE-ACETAMINOPHEN (NORCO/VICODIN) 5-325 MG TABLET    Take 1 tablet by mouth every 4 (four) hours as needed for up to 3 days for severe pain.   METRONIDAZOLE (FLAGYL) 500 MG TABLET    Take 1 tablet (500 mg total) by mouth  2 (two) times daily for 10 days.   ONDANSETRON (ZOFRAN ODT) 8 MG DISINTEGRATING TABLET    Take 1 tablet (8 mg total) by mouth every 8 (eight) hours as needed for nausea or vomiting.     Note:  This document was prepared using Dragon voice recognition software and may include unintentional dictation errors.    Arta Silence, MD 12/10/18 [phone]

## 2018-12-10 NOTE — ED Notes (Signed)
CT notified of pt completing contrast 

## 2018-12-10 NOTE — ED Notes (Signed)
Pt reports nausea at this time. Pt has finished the first bottle. Advised wife to tell pt not to drink anymore and to take a break at this time.

## 2018-12-10 NOTE — ED Notes (Signed)
Pt to the er for lower abd pain that is consistent with diverticulitis that he has had in the past. Pt reports that he had eaten pecans this past week that could have aggravated it. Pt has received info on how to drink contrast and when to stop.

## 2018-12-10 NOTE — Discharge Instructions (Addendum)
Take the antibiotics as prescribed and finish the full course.  Return to the ER immediately for new, worsening, or persistent severe pain, fever, blood in the stool, vomiting, or if you are unable to take the medication.  Follow-up with your regular doctor in 1 to 2 weeks.

## 2018-12-10 NOTE — ED Triage Notes (Signed)
Pt arrived via POV with family with reports of RLQ and LLQ abd pain that started last night. Pt also reports vomiting and diarrhea.  Pt states pain feels similar to diverticulitis pain.

## 2018-12-11 DIAGNOSIS — K5792 Diverticulitis of intestine, part unspecified, without perforation or abscess without bleeding: Secondary | ICD-10-CM | POA: Diagnosis not present

## 2019-05-14 ENCOUNTER — Other Ambulatory Visit: Payer: Self-pay

## 2019-05-14 DIAGNOSIS — Z8249 Family history of ischemic heart disease and other diseases of the circulatory system: Secondary | ICD-10-CM | POA: Insufficient documentation

## 2019-05-14 DIAGNOSIS — Z885 Allergy status to narcotic agent status: Secondary | ICD-10-CM | POA: Insufficient documentation

## 2019-05-14 DIAGNOSIS — K625 Hemorrhage of anus and rectum: Secondary | ICD-10-CM | POA: Diagnosis present

## 2019-05-14 DIAGNOSIS — Z79899 Other long term (current) drug therapy: Secondary | ICD-10-CM | POA: Insufficient documentation

## 2019-05-14 DIAGNOSIS — N183 Chronic kidney disease, stage 3 unspecified: Secondary | ICD-10-CM | POA: Insufficient documentation

## 2019-05-14 DIAGNOSIS — I25110 Atherosclerotic heart disease of native coronary artery with unstable angina pectoris: Secondary | ICD-10-CM | POA: Diagnosis not present

## 2019-05-14 DIAGNOSIS — M109 Gout, unspecified: Secondary | ICD-10-CM | POA: Diagnosis not present

## 2019-05-14 DIAGNOSIS — K920 Hematemesis: Secondary | ICD-10-CM | POA: Diagnosis not present

## 2019-05-14 DIAGNOSIS — Z7901 Long term (current) use of anticoagulants: Secondary | ICD-10-CM | POA: Diagnosis not present

## 2019-05-14 DIAGNOSIS — Z20822 Contact with and (suspected) exposure to covid-19: Secondary | ICD-10-CM | POA: Diagnosis not present

## 2019-05-14 DIAGNOSIS — I482 Chronic atrial fibrillation, unspecified: Secondary | ICD-10-CM | POA: Diagnosis not present

## 2019-05-14 DIAGNOSIS — E1122 Type 2 diabetes mellitus with diabetic chronic kidney disease: Secondary | ICD-10-CM | POA: Diagnosis not present

## 2019-05-14 DIAGNOSIS — F329 Major depressive disorder, single episode, unspecified: Secondary | ICD-10-CM | POA: Diagnosis not present

## 2019-05-14 DIAGNOSIS — Z794 Long term (current) use of insulin: Secondary | ICD-10-CM | POA: Diagnosis not present

## 2019-05-14 DIAGNOSIS — E785 Hyperlipidemia, unspecified: Secondary | ICD-10-CM | POA: Insufficient documentation

## 2019-05-14 DIAGNOSIS — Z955 Presence of coronary angioplasty implant and graft: Secondary | ICD-10-CM | POA: Insufficient documentation

## 2019-05-14 DIAGNOSIS — I252 Old myocardial infarction: Secondary | ICD-10-CM | POA: Diagnosis not present

## 2019-05-14 DIAGNOSIS — I700 Atherosclerosis of aorta: Secondary | ICD-10-CM | POA: Diagnosis not present

## 2019-05-14 DIAGNOSIS — K5792 Diverticulitis of intestine, part unspecified, without perforation or abscess without bleeding: Principal | ICD-10-CM | POA: Insufficient documentation

## 2019-05-14 DIAGNOSIS — Z7982 Long term (current) use of aspirin: Secondary | ICD-10-CM | POA: Insufficient documentation

## 2019-05-14 DIAGNOSIS — I129 Hypertensive chronic kidney disease with stage 1 through stage 4 chronic kidney disease, or unspecified chronic kidney disease: Secondary | ICD-10-CM | POA: Diagnosis not present

## 2019-05-14 DIAGNOSIS — K219 Gastro-esophageal reflux disease without esophagitis: Secondary | ICD-10-CM | POA: Insufficient documentation

## 2019-05-14 DIAGNOSIS — N281 Cyst of kidney, acquired: Secondary | ICD-10-CM | POA: Insufficient documentation

## 2019-05-14 DIAGNOSIS — E7800 Pure hypercholesterolemia, unspecified: Secondary | ICD-10-CM | POA: Insufficient documentation

## 2019-05-14 DIAGNOSIS — Z87891 Personal history of nicotine dependence: Secondary | ICD-10-CM | POA: Insufficient documentation

## 2019-05-14 DIAGNOSIS — I480 Paroxysmal atrial fibrillation: Secondary | ICD-10-CM | POA: Diagnosis not present

## 2019-05-14 DIAGNOSIS — R110 Nausea: Secondary | ICD-10-CM | POA: Diagnosis present

## 2019-05-14 DIAGNOSIS — R1032 Left lower quadrant pain: Secondary | ICD-10-CM | POA: Insufficient documentation

## 2019-05-14 LAB — CBC
HCT: 32.5 % — ABNORMAL LOW (ref 39.0–52.0)
Hemoglobin: 11 g/dL — ABNORMAL LOW (ref 13.0–17.0)
MCH: 31.9 pg (ref 26.0–34.0)
MCHC: 33.8 g/dL (ref 30.0–36.0)
MCV: 94.2 fL (ref 80.0–100.0)
Platelets: 234 10*3/uL (ref 150–400)
RBC: 3.45 MIL/uL — ABNORMAL LOW (ref 4.22–5.81)
RDW: 13.2 % (ref 11.5–15.5)
WBC: 12.8 10*3/uL — ABNORMAL HIGH (ref 4.0–10.5)
nRBC: 0 % (ref 0.0–0.2)

## 2019-05-14 LAB — TYPE AND SCREEN
ABO/RH(D): O POS
Antibody Screen: NEGATIVE

## 2019-05-14 LAB — COMPREHENSIVE METABOLIC PANEL
ALT: 14 U/L (ref 0–44)
AST: 22 U/L (ref 15–41)
Albumin: 3 g/dL — ABNORMAL LOW (ref 3.5–5.0)
Alkaline Phosphatase: 72 U/L (ref 38–126)
Anion gap: 9 (ref 5–15)
BUN: 27 mg/dL — ABNORMAL HIGH (ref 8–23)
CO2: 21 mmol/L — ABNORMAL LOW (ref 22–32)
Calcium: 9 mg/dL (ref 8.9–10.3)
Chloride: 111 mmol/L (ref 98–111)
Creatinine, Ser: 1.9 mg/dL — ABNORMAL HIGH (ref 0.61–1.24)
GFR calc Af Amer: 41 mL/min — ABNORMAL LOW (ref >60)
GFR calc non Af Amer: 35 mL/min — ABNORMAL LOW (ref >60)
Glucose, Bld: 112 mg/dL — ABNORMAL HIGH (ref 70–99)
Potassium: 4.1 mmol/L (ref 3.5–5.1)
Sodium: 141 mmol/L (ref 135–145)
Total Bilirubin: 0.8 mg/dL (ref 0.3–1.2)
Total Protein: 6.7 g/dL (ref 6.5–8.1)

## 2019-05-14 NOTE — ED Triage Notes (Signed)
Pt arrives to ED via POV from home with c/o hematemesis and rectal bleeding x4 days. Pt reports both emesis and rectal bleeding has been a mixture of both bright red and dark red blood with small clots. Pt reports taking Eliquis and daily aspirin for h/x of Afib with previous stent placement approximately 2 yrs ago. Pt reports lower abdominal cramping, but denies CP or SHOB. Pt reports h/x of same without need for blood transfusion. Pt is A&O, in NAD; RR even, regular, and unlabored.

## 2019-05-15 ENCOUNTER — Other Ambulatory Visit: Payer: Self-pay

## 2019-05-15 ENCOUNTER — Encounter: Payer: Self-pay | Admitting: Internal Medicine

## 2019-05-15 ENCOUNTER — Observation Stay
Admission: EM | Admit: 2019-05-15 | Discharge: 2019-05-16 | Disposition: A | Discharge status: Home / Self Care | Payer: Medicare PPO | Attending: Internal Medicine | Admitting: Internal Medicine

## 2019-05-15 ENCOUNTER — Emergency Department: Payer: Medicare PPO

## 2019-05-15 DIAGNOSIS — I482 Chronic atrial fibrillation, unspecified: Secondary | ICD-10-CM

## 2019-05-15 DIAGNOSIS — Z7901 Long term (current) use of anticoagulants: Secondary | ICD-10-CM

## 2019-05-15 DIAGNOSIS — K922 Gastrointestinal hemorrhage, unspecified: Secondary | ICD-10-CM

## 2019-05-15 DIAGNOSIS — E119 Type 2 diabetes mellitus without complications: Secondary | ICD-10-CM

## 2019-05-15 DIAGNOSIS — K920 Hematemesis: Secondary | ICD-10-CM

## 2019-05-15 DIAGNOSIS — I2510 Atherosclerotic heart disease of native coronary artery without angina pectoris: Secondary | ICD-10-CM | POA: Diagnosis present

## 2019-05-15 DIAGNOSIS — K921 Melena: Secondary | ICD-10-CM

## 2019-05-15 DIAGNOSIS — I10 Essential (primary) hypertension: Secondary | ICD-10-CM

## 2019-05-15 DIAGNOSIS — K625 Hemorrhage of anus and rectum: Secondary | ICD-10-CM | POA: Diagnosis not present

## 2019-05-15 DIAGNOSIS — N189 Chronic kidney disease, unspecified: Secondary | ICD-10-CM

## 2019-05-15 DIAGNOSIS — R110 Nausea: Secondary | ICD-10-CM

## 2019-05-15 DIAGNOSIS — K5792 Diverticulitis of intestine, part unspecified, without perforation or abscess without bleeding: Secondary | ICD-10-CM | POA: Diagnosis not present

## 2019-05-15 DIAGNOSIS — N184 Chronic kidney disease, stage 4 (severe): Secondary | ICD-10-CM

## 2019-05-15 HISTORY — DX: Hematemesis: K92.0

## 2019-05-15 HISTORY — DX: Gastrointestinal hemorrhage, unspecified: K92.2

## 2019-05-15 LAB — HEMOGLOBIN AND HEMATOCRIT, BLOOD
HCT: 26.8 % — ABNORMAL LOW (ref 39.0–52.0)
Hemoglobin: 9.1 g/dL — ABNORMAL LOW (ref 13.0–17.0)

## 2019-05-15 LAB — GLUCOSE, CAPILLARY
Glucose-Capillary: 105 mg/dL — ABNORMAL HIGH (ref 70–99)
Glucose-Capillary: 105 mg/dL — ABNORMAL HIGH (ref 70–99)
Glucose-Capillary: 85 mg/dL (ref 70–99)
Glucose-Capillary: 91 mg/dL (ref 70–99)
Glucose-Capillary: 98 mg/dL (ref 70–99)

## 2019-05-15 LAB — RESPIRATORY PANEL BY RT PCR (FLU A&B, COVID)
Influenza A by PCR: NEGATIVE
Influenza B by PCR: NEGATIVE
SARS Coronavirus 2 by RT PCR: NEGATIVE

## 2019-05-15 LAB — PROTIME-INR
INR: 1 (ref 0.8–1.2)
Prothrombin Time: 13.3 seconds (ref 11.4–15.2)

## 2019-05-15 LAB — APTT: aPTT: 44 seconds — ABNORMAL HIGH (ref 24–36)

## 2019-05-15 LAB — HEMOGLOBIN: Hemoglobin: 10.7 g/dL — ABNORMAL LOW (ref 13.0–17.0)

## 2019-05-15 LAB — HIV ANTIBODY (ROUTINE TESTING W REFLEX): HIV Screen 4th Generation wRfx: NONREACTIVE

## 2019-05-15 MED ORDER — ONDANSETRON HCL 4 MG/2ML IJ SOLN
4.0000 mg | Freq: Once | INTRAMUSCULAR | Status: AC
  Administered 2019-05-15: 4 mg via INTRAVENOUS
  Filled 2019-05-15: qty 2

## 2019-05-15 MED ORDER — PIPERACILLIN-TAZOBACTAM 3.375 G IVPB
3.3750 g | Freq: Three times a day (TID) | INTRAVENOUS | Status: DC
  Administered 2019-05-16: 06:00:00 3.375 g via INTRAVENOUS
  Filled 2019-05-15: qty 50

## 2019-05-15 MED ORDER — LABETALOL HCL 5 MG/ML IV SOLN
10.0000 mg | Freq: Once | INTRAVENOUS | Status: AC
  Administered 2019-05-15: 08:00:00 10 mg via INTRAVENOUS
  Filled 2019-05-15: qty 4

## 2019-05-15 MED ORDER — SODIUM CHLORIDE 0.9 % IV BOLUS
1000.0000 mL | Freq: Once | INTRAVENOUS | Status: AC
  Administered 2019-05-15: 07:00:00 1000 mL via INTRAVENOUS

## 2019-05-15 MED ORDER — PIPERACILLIN-TAZOBACTAM 3.375 G IVPB 30 MIN
3.3750 g | Freq: Once | INTRAVENOUS | Status: AC
  Administered 2019-05-15: 08:00:00 3.375 g via INTRAVENOUS
  Filled 2019-05-15: qty 50

## 2019-05-15 MED ORDER — LABETALOL HCL 5 MG/ML IV SOLN
10.0000 mg | INTRAVENOUS | Status: DC | PRN
  Administered 2019-05-15 – 2019-05-16 (×2): 10 mg via INTRAVENOUS
  Filled 2019-05-15 (×2): qty 4

## 2019-05-15 MED ORDER — SODIUM CHLORIDE 0.9% IV SOLUTION
Freq: Once | INTRAVENOUS | Status: DC
  Filled 2019-05-15: qty 250

## 2019-05-15 MED ORDER — ACETAMINOPHEN 325 MG PO TABS
650.0000 mg | ORAL_TABLET | Freq: Four times a day (QID) | ORAL | Status: DC | PRN
Start: 2019-05-15 — End: 2019-05-16
  Administered 2019-05-15: 22:00:00 650 mg via ORAL
  Filled 2019-05-15: qty 2

## 2019-05-15 MED ORDER — ONDANSETRON HCL 4 MG/2ML IJ SOLN
4.0000 mg | Freq: Four times a day (QID) | INTRAMUSCULAR | Status: DC | PRN
  Administered 2019-05-15: 20:00:00 4 mg via INTRAVENOUS
  Filled 2019-05-15: qty 2

## 2019-05-15 MED ORDER — INSULIN ASPART 100 UNIT/ML ~~LOC~~ SOLN: 0.0000 [IU] | SUBCUTANEOUS | Status: DC

## 2019-05-15 MED ORDER — SODIUM CHLORIDE 0.9 % IV SOLN
80.0000 mg | Freq: Once | INTRAVENOUS | Status: AC
  Administered 2019-05-15: 80 mg via INTRAVENOUS
  Filled 2019-05-15: qty 80

## 2019-05-15 MED ORDER — SODIUM CHLORIDE 0.9 % IV SOLN
8.0000 mg/h | INTRAVENOUS | Status: DC
  Administered 2019-05-15 – 2019-05-16 (×3): 8 mg/h via INTRAVENOUS
  Filled 2019-05-15 (×3): qty 80

## 2019-05-15 MED ORDER — ONDANSETRON HCL 4 MG PO TABS: 4.0000 mg | ORAL_TABLET | Freq: Four times a day (QID) | ORAL | Status: DC | PRN

## 2019-05-15 MED ORDER — TRAMADOL HCL 50 MG PO TABS: 50.0000 mg | ORAL_TABLET | Freq: Four times a day (QID) | ORAL | Status: DC | PRN

## 2019-05-15 MED ORDER — SODIUM CHLORIDE 0.9 % IV SOLN: INTRAVENOUS | Status: DC

## 2019-05-15 NOTE — ED Notes (Signed)
Ready bed @ 1847, patient going to room 259, spoke with RN Marshell.

## 2019-05-15 NOTE — H&P (Signed)
History and Physical    CATCHER DEHOYOS ZOX:[id number] DOB: Jan 14, 1951 DOA: 05/15/2019  PCP: [name], MD   Patient coming from: Home  I have personally briefly reviewed patient's old medical records in [place]  Chief Complaint: Rectal bleeding                                Hematemesis HPI: Casey French is a 69 y.o. male with medical history significant for paroxysmal atrial fibrillation on chronic anticoagulation therapy with Eliquis, coronary artery disease status post stent angioplasty, depression and diabetes mellitus who presents to the emergency room for evaluation of a 4-day history of persistent and worsening rectal bleed. Patient states that he has hemorrhoids and noted blood each time he wiped after a bowel movement but then blood has continued to increase.  He describes it as bright red blood.  Associated with the rectal bleeding is nausea, decreased appetite and an episode of emesis that contained bright red blood.  He complains of abdominal discomfort mostly in the supra pubic /left lower quadrant but denies having any pain.  He also complains of generalized weakness associated with dizziness when he stands up too quickly. He also complains of low back pain which he states is new. He denies having any fever, sore throat, cough, shortness of breath, chest pain. He had a CT scan of abdomen and pelvis done in the emergency room which showed acute uncomplicated diverticulitis involving the upper sigmoid colon. Diffuse and advanced colonic diverticulosis.  ED Course: Patient seen in the emergency room for evaluation of hematochezia as well as hematemesis and is noted to be on Eliquis for his history of atrial fibrillation.  He received IV fluid hydration in the emergency room and was started on a Protonix drip.  Hemoglobin in the ER was 11 and vital signs appeared stable.  Patient will be admitted to the hospital for further evaluation.  Review of Systems: As per  HPI otherwise 10 point review of systems negative.   Past Medical History:  Diagnosis Date  . Depression   . Diabetes mellitus without complication ([place])   . Gout   . High cholesterol   . Hypertension   . Myocardial infarction [facility])     Past Surgical History:  Procedure Laterality Date  . CARDIAC CATHETERIZATION    . COLONOSCOPY WITH PROPOFOL N/A 04/01/2017   Procedure: COLONOSCOPY WITH PROPOFOL;  Surgeon: Lollie Sails, MD;  Location: [facility] ENDOSCOPY;  Service: Endoscopy;  Laterality: N/A;  . right hand surgery     3 screws in right hand     reports that he quit smoking about 18 years ago. He has never used smokeless tobacco. He reports that he does not drink alcohol or use drugs.  Allergies  Allergen Reactions  . Morphine Anxiety    Other reaction(s): Other (See Comments) agitation   . Morphine And Related Anxiety    Family History  Problem Relation Age of Onset  . CAD Other      Prior to Admission medications   Medication Sig Start Date End Date Taking? Authorizing Provider  fluticasone (FLONASE) 50 MCG/ACT nasal spray Place 2 sprays into both nostrils daily. 04/30/14  Yes [provider]  lisinopril (ZESTRIL) 10 MG tablet Take 10 mg by mouth daily. 06/30/18  Yes [provider]  psyllium (METAMUCIL) 58.6 % packet Take 1 packet by mouth in the morning and at bedtime.   Yes  [provider]  allopurinol (ZYLOPRIM) 100 MG tablet Take 100 mg by mouth daily.    [provider]  aspirin EC 81 MG tablet Take 81 mg by mouth daily.    [provider]  aspirin-acetaminophen-caffeine (EXCEDRIN MIGRAINE) [phone] MG tablet Take 1 tablet by mouth every 6 (six) hours as needed for headache.    [provider]  ciprofloxacin (CIPRO) 500 MG tablet Take 1 tablet (500 mg total) by mouth 2 (two) times daily. 06/26/18   Lavonia Drafts, MD  ELIQUIS 5 MG TABS tablet Take 5 mg by mouth daily. 08/16/16   [provider]    gabapentin (NEURONTIN) 300 MG capsule Take 300 mg by mouth at bedtime. 09/25/16   [provider]  hydrochlorothiazide (HYDRODIURIL) 25 MG tablet Take 25 mg by mouth daily. 03/31/19   [provider]  isosorbide mononitrate (IMDUR) 60 MG 24 hr tablet Take 60 mg by mouth daily.    [provider]  metFORMIN (GLUCOPHAGE-XR) 500 MG 24 hr tablet Take 500 mg by mouth 2 (two) times daily.     [provider]  metoprolol succinate (TOPROL-XL) 100 MG 24 hr tablet Take 100 mg by mouth daily.    [provider]  nitroGLYCERIN (NITROSTAT) 0.4 MG SL tablet Place 0.4 mg under the tongue every 5 (five) minutes as needed for chest pain.    [provider]  omeprazole (PRILOSEC) 20 MG capsule Take 20 mg by mouth daily.    [provider]  ondansetron (ZOFRAN ODT) 8 MG disintegrating tablet Take 1 tablet (8 mg total) by mouth every 8 (eight) hours as needed for nausea or vomiting. 12/10/18   Arta Silence, MD  oxyCODONE-acetaminophen (ROXICET) 5-325 MG tablet Take 1 tablet by mouth every 6 (six) hours as needed for severe pain. Patient not taking: Reported on 11/10/2016 10/16/16   Darel Hong, MD  oxyCODONE-acetaminophen (ROXICET) 5-325 MG tablet Take 1 tablet by mouth every 6 (six) hours as needed. Patient not taking: Reported on 04/01/2017 01/13/17   Loney Hering, MD  rosuvastatin (CRESTOR) 40 MG tablet Take 40 mg by mouth daily. 03/06/19   [provider]  traMADol (ULTRAM) 50 MG tablet Take 1 tablet (50 mg total) by mouth every 6 (six) hours as needed. 06/26/18 06/26/19  Lavonia Drafts, MD    Physical Exam: Vitals:   05/14/19 2150 05/14/19 2151 05/15/19 0440  BP: (!) 174/81  (!) 168/98  Pulse: 92  95  Resp: 16  18  Temp: 97.6 F (36.4 C)  98.3 F (36.8 C)  TempSrc: Oral  Oral  SpO2: 97%  95%  Weight:  73 kg   Height:  5\' 9"  (1.753 m)      Vitals:   05/14/19 2150 05/14/19 2151 05/15/19 0440  BP: (!) 174/81  (!) 168/98   Pulse: 92  95  Resp: 16  18  Temp: 97.6 F (36.4 C)  98.3 F (36.8 C)  TempSrc: Oral  Oral  SpO2: 97%  95%  Weight:  73 kg   Height:  5\' 9"  (1.753 m)     Constitutional: NAD, alert and oriented x 3 Eyes: PERRL, lids and conjunctivae normal ENMT: Mucous membranes are moist.  Neck: normal, supple, no masses, no thyromegaly Respiratory: clear to auscultation bilaterally, no wheezing, no crackles. Normal respiratory effort. No accessory muscle use.  Cardiovascular: Regular rate and rhythm, no murmurs / rubs / gallops. No extremity edema. 2+ pedal pulses. No carotid bruits.  Abdomen: no tenderness, no masses  palpated. No hepatosplenomegaly. Bowel sounds positive.  Musculoskeletal: no clubbing / cyanosis. No joint deformity upper and lower extremities.  Skin: no rashes, lesions, ulcers.  Neurologic: No gross focal neurologic deficit. Psychiatric: Normal mood and affect.   Labs on Admission: I have personally reviewed following labs and imaging studies  CBC: Recent Labs  Lab 05/14/19 2155  WBC 12.8*  HGB 11.0*  HCT 32.5*  MCV 94.2  PLT 944   Basic Metabolic Panel: Recent Labs  Lab 05/14/19 2155  NA 141  K 4.1  CL 111  CO2 21*  GLUCOSE 112*  BUN 27*  CREATININE 1.90*  CALCIUM 9.0   GFR: Estimated Creatinine Clearance: 37.2 mL/min (A) (by C-G formula based on SCr of 1.9 mg/dL (H)). Liver Function Tests: Recent Labs  Lab 05/14/19 2155  AST 22  ALT 14  ALKPHOS 72  BILITOT 0.8  PROT 6.7  ALBUMIN 3.0*   No results for input(s): LIPASE, AMYLASE in the last 168 hours. No results for input(s): AMMONIA in the last 168 hours. Coagulation Profile: No results for input(s): INR, PROTIME in the last 168 hours. Cardiac Enzymes: No results for input(s): CKTOTAL, CKMB, CKMBINDEX, TROPONINI in the last 168 hours. BNP (last 3 results) No results for input(s): PROBNP in the last 8760 hours. HbA1C: No results for input(s): HGBA1C in the last 72 hours. CBG: No results for  input(s): GLUCAP in the last 168 hours. Lipid Profile: No results for input(s): CHOL, HDL, LDLCALC, TRIG, CHOLHDL, LDLDIRECT in the last 72 hours. Thyroid Function Tests: No results for input(s): TSH, T4TOTAL, FREET4, T3FREE, THYROIDAB in the last 72 hours. Anemia Panel: No results for input(s): VITAMINB12, FOLATE, FERRITIN, TIBC, IRON, RETICCTPCT in the last 72 hours. Urine analysis:    Component Value Date/Time   COLORURINE YELLOW (A) 12/10/2018 1632   APPEARANCEUR CLEAR (A) 12/10/2018 1632   LABSPEC 1.024 12/10/2018 1632   PHURINE 6.0 12/10/2018 1632   GLUCOSEU 150 (A) 12/10/2018 1632   HGBUR NEGATIVE 12/10/2018 1632   BILIRUBINUR NEGATIVE 12/10/2018 1632   KETONESUR NEGATIVE 12/10/2018 1632   PROTEINUR >=300 (A) 12/10/2018 1632   NITRITE NEGATIVE 12/10/2018 1632   LEUKOCYTESUR NEGATIVE 12/10/2018 1632    Radiological Exams on Admission: CT ABDOMEN PELVIS WO CONTRAST  Result Date: 05/15/2019 CLINICAL DATA:  Hematemesis and rectal bleeding for 4 days. EXAM: CT ABDOMEN AND PELVIS WITHOUT CONTRAST TECHNIQUE: Multidetector CT imaging of the abdomen and pelvis was performed following the standard protocol without IV contrast. COMPARISON:  CT scan and 12/10/2018 FINDINGS: Lower chest: Mild eventration of the right hemidiaphragm with some overlying vascular crowding. No infiltrates, edema or effusions. The heart is normal in size. Age advanced coronary artery and aortic calcifications with dense calcifications involving the LAD and probable stent. Hepatobiliary: No focal hepatic lesions or intrahepatic biliary dilatation. Layering higher attenuation material in the gallbladder could be sludge or gallstones or both. No findings for acute cholecystitis. No common bile duct dilatation. Pancreas: No mass, inflammation or ductal dilatation. Spleen: Normal size. No focal lesions. Adrenals/Urinary Tract: The adrenal glands and kidneys are unremarkable. No renal, ureteral or bladder calculi or mass.  Simple right renal cyst is unchanged. The bladder is unremarkable. Stomach/Bowel: The stomach, duodenum and small bowel are grossly normal without oral contrast. No acute inflammatory process, mass lesion or obstructive findings. The terminal ileum and appendix appear normal. Diffuse and advanced colonic diverticulosis most severe in the sigmoid area. There are changes of acute uncomplicated diverticulitis involving the upper sigmoid colon. No evidence of colonic  mass or obstruction. Vascular/Lymphatic: Age advanced atherosclerotic calcifications involving the aorta and branch vessel ostia. No aneurysm. No mesenteric or retroperitoneal mass or adenopathy. Reproductive: The prostate gland and seminal vesicles are unremarkable. Other: No pelvic mass or adenopathy. No free pelvic fluid collections. No inguinal mass or adenopathy. No abdominal wall hernia or subcutaneous lesions. Musculoskeletal: No significant bony findings. Chronic left hip AVN. IMPRESSION: 1. Acute uncomplicated diverticulitis involving the upper sigmoid colon. 2. Diffuse and advanced colonic diverticulosis. 3. Age advanced atherosclerotic calcifications involving the aorta and branch vessels and coronary arteries. 4. Layering higher attenuation material in the gallbladder could be sludge or gallstones or both. 5. Chronic left hip AVN. Aortic Atherosclerosis (ICD10-I70.0). Electronically Signed   By: Marijo Sanes M.D.   On: 05/15/2019 07:00    EKG: Independently reviewed. Sinus rhythm LVH  Assessment/Plan Principal Problem:   GI bleed Active Problems:   Benign essential HTN   DM type 2 (diabetes mellitus, type 2) (HCC)   CAD (coronary artery disease)   Hematemesis   Hematochezia   Chronic anticoagulation   Atrial fibrillation, chronic (HCC)   CKD (chronic kidney disease), stage III    Acute Diverticulitis Patient presents for evaluation of rectal bleed and CT scan of abdomen and pelvis showed acute uncomplicated diverticulitis  involving the upper sigmoid colon. Diffuse and advanced colonic diverticulosis Continue empiric antibiotic therapy with Zosyn started in the ER    Bright red blood per rectum Secondary to diverticular disease Hold Eliquis Monitor serial H&H Keep patient n.p.o. for now We will request GI consult   Diabetes mellitus with chronic kidney disease stage III Keep patient n.p.o. IV fluid hydration Glycemic control   Paroxysmal atrial fibrillation on chronic anticoagulation therapy Hold Eliquis due to GI bleed    History of coronary artery disease Hold aspirin due to GI bleed   DVT prophylaxis:  SCD Code Status: Full Family Communication: Plan of care was discussed with patient and his wife at the bedside.  They verbalized understanding and agree with the plan Disposition Plan: Back to previous home environment Consults called: GI Does patient need palliative care consult : No Does patient require spiritual care/Chaplain services: Designated health care POA:   Collier Bullock MD [facility]     05/15/2019, 7:23 AM

## 2019-05-15 NOTE — ED Notes (Signed)
Report received from Oletta Darter., RN. Pt transported to room 34. Denies complaints at this time. Pt A&Ox4 and NAD. V/S WNL. ED stretcher locked and in lowest position. Call bell within reach.

## 2019-05-15 NOTE — ED Notes (Signed)
MD messaged regarding pt request for pain medication - headache.

## 2019-05-15 NOTE — ED Provider Notes (Signed)
[facility] Emergency Department Provider Note  ____________________________________________   First MD Initiated Contact with Patient 05/15/19 [phone]     (approximate)  I have reviewed the triage vital signs and the nursing notes.   HISTORY  Chief Complaint Hematemesis and Rectal Bleeding    HPI Casey French is a 69 y.o. male with medical history as listed below which includes coronary artery disease status post at least 1 and most likely 2 cardiac stents on Eliquis also with a history of at least paroxysmal atrial fibrillation.  He presents tonight for evaluation of about 4 days of persistent and worsening rectal bleeding.  He said that it started out being relatively normal by today it was a larger volume each time he had a bowel movement.  He describes it as bright red.  Additionally, over the last 2 or so days he has had persistent nausea and decreased appetite.  He had one episode of vomiting yesterday that also had bright red blood.  He has some vague abdominal discomfort but no pain.  He has felt generalized weakness over the last few days and a little bit dizzy if he stands up too quickly.  He denies fever, sore throat, chest pain, shortness of breath, cough.  He describes the symptoms overall as moderate in severity.  He has not had contact with anyone known to have COVID-19 and has had no respiratory symptoms.         Past Medical History:  Diagnosis Date  . Depression   . Diabetes mellitus without complication ([place])   . Gout   . High cholesterol   . Hypertension   . Myocardial infarction [facility])     Patient Active Problem List   Diagnosis Date Noted  . Hematemesis 05/15/2019  . Hematochezia 05/15/2019  . Chronic anticoagulation 05/15/2019  . Atrial fibrillation, chronic ([place]) 05/15/2019  . GI bleed 05/15/2019  . Unstable angina ([place]) 11/10/2016  . Angina at rest [facility]) 05/01/2015  . Hypertensive urgency 06/14/2014  . Diverticulitis  06/14/2014  . AKI (acute kidney injury) ([place]) 06/14/2014  . Benign essential HTN 06/14/2014  . Hyperlipidemia 06/14/2014  . DM type 2 (diabetes mellitus, type 2) ([place]) 06/14/2014  . CAD (coronary artery disease) 06/14/2014  . GERD without esophagitis 06/14/2014  . Closed head injury 03/24/2013    Past Surgical History:  Procedure Laterality Date  . CARDIAC CATHETERIZATION    . COLONOSCOPY WITH PROPOFOL N/A 04/01/2017   Procedure: COLONOSCOPY WITH PROPOFOL;  Surgeon: Lollie Sails, MD;  Location: [facility] ENDOSCOPY;  Service: Endoscopy;  Laterality: N/A;  . right hand surgery     3 screws in right hand    Prior to Admission medications   Medication Sig Start Date End Date Taking? Authorizing Provider  fluticasone (FLONASE) 50 MCG/ACT nasal spray Place 2 sprays into both nostrils daily. 04/30/14  Yes [provider]  lisinopril (ZESTRIL) 10 MG tablet Take 10 mg by mouth daily. 06/30/18  Yes [provider]  psyllium (METAMUCIL) 58.6 % packet Take 1 packet by mouth in the morning and at bedtime.   Yes [provider]  allopurinol (ZYLOPRIM) 100 MG tablet Take 100 mg by mouth daily.    [provider]  aspirin EC 81 MG tablet Take 81 mg by mouth daily.    [provider]  aspirin-acetaminophen-caffeine (EXCEDRIN MIGRAINE) [phone] MG tablet Take 1 tablet by mouth every 6 (six) hours as needed for headache.    [provider]  ciprofloxacin (CIPRO)  500 MG tablet Take 1 tablet (500 mg total) by mouth 2 (two) times daily. 06/26/18   Lavonia Drafts, MD  ELIQUIS 5 MG TABS tablet Take 5 mg by mouth daily. 08/16/16   [provider]  gabapentin (NEURONTIN) 300 MG capsule Take 300 mg by mouth at bedtime. 09/25/16   [provider]  hydrochlorothiazide (HYDRODIURIL) 25 MG tablet Take 25 mg by mouth daily. 03/31/19   [provider]  isosorbide mononitrate (IMDUR) 60 MG 24 hr tablet Take 60 mg by mouth daily.    [provider]  metFORMIN (GLUCOPHAGE-XR) 500 MG 24 hr tablet Take 500 mg by mouth 2 (two) times daily.     [provider]  metoprolol succinate (TOPROL-XL) 100 MG 24 hr tablet Take 100 mg by mouth daily.    [provider]  nitroGLYCERIN (NITROSTAT) 0.4 MG SL tablet Place 0.4 mg under the tongue every 5 (five) minutes as needed for chest pain.    [provider]  omeprazole (PRILOSEC) 20 MG capsule Take 20 mg by mouth daily.    [provider]  ondansetron (ZOFRAN ODT) 8 MG disintegrating tablet Take 1 tablet (8 mg total) by mouth every 8 (eight) hours as needed for nausea or vomiting. 12/10/18   Arta Silence, MD  oxyCODONE-acetaminophen (ROXICET) 5-325 MG tablet Take 1 tablet by mouth every 6 (six) hours as needed for severe pain. Patient not taking: Reported on 11/10/2016 10/16/16   Darel Hong, MD  oxyCODONE-acetaminophen (ROXICET) 5-325 MG tablet Take 1 tablet by mouth every 6 (six) hours as needed. Patient not taking: Reported on 04/01/2017 01/13/17   Loney Hering, MD  rosuvastatin (CRESTOR) 40 MG tablet Take 40 mg by mouth daily. 03/06/19   [provider]  traMADol (ULTRAM) 50 MG tablet Take 1 tablet (50 mg total) by mouth every 6 (six) hours as needed. 06/26/18 06/26/19  Lavonia Drafts, MD    Allergies Morphine and Morphine and related  Family History  Problem Relation Age of Onset  . CAD Other     Social History Social History   Tobacco Use  . Smoking status: Former Smoker    Quit date: 03/24/2001    Years since quitting: 18.1  . Smokeless tobacco: Never Used  Substance Use Topics  . Alcohol use: No    Alcohol/week: 0.0 standard drinks  . Drug use: No    Review of Systems Constitutional: No fever/chills Eyes: No visual changes. ENT: No sore throat. Cardiovascular: Denies chest pain. Respiratory: Denies shortness of breath. Gastrointestinal: About 4 days of bright red rectal bleeding, persistent nausea, decreased  appetite, and at least 1 episode of hematemesis (bright red). Genitourinary: Negative for dysuria. Musculoskeletal: Negative for neck pain.  Negative for back pain. Integumentary: Negative for rash. Neurological: Negative for headaches, focal weakness or numbness.   ____________________________________________   PHYSICAL EXAM:  VITAL SIGNS: ED Triage Vitals  Enc Vitals Group     BP 05/14/19 2150 (!) 174/81     Pulse Rate 05/14/19 2150 92     Resp 05/14/19 2150 16     Temp 05/14/19 2150 97.6 F (36.4 C)     Temp Source 05/14/19 2150 Oral     SpO2 05/14/19 2150 97 %     Weight 05/14/19 2151 73 kg (161 lb)     Height 05/14/19 2151 1.753 m (5\' 9" )     Head Circumference --      Peak Flow --      Pain Score 05/14/19 2151  7     Pain Loc --      Pain Edu? --      Excl. in [place]? --     Constitutional: Alert and oriented.  Eyes: Conjunctivae are normal.  Head: Atraumatic. Nose: No congestion/rhinnorhea. Mouth/Throat: Patient is wearing a mask. Neck: No stridor.  No meningeal signs.   Cardiovascular: Normal rate, regular rhythm. Good peripheral circulation. Grossly normal heart sounds. Respiratory: Normal respiratory effort.  No retractions. Gastrointestinal: Soft and nondistended.  Vague minimal tenderness to palpation throughout the abdomen, no focal tenderness/peritonitis Rectal:  Normal external exam.  Brown stool, no gross blood, Hemoccult positive.  Quality control passed.  Musculoskeletal: No lower extremity tenderness nor edema. No gross deformities of extremities. Neurologic:  Normal speech and language. No gross focal neurologic deficits are appreciated.  Skin:  Skin is warm, dry and intact. Psychiatric: Mood and affect are normal. Speech and behavior are normal.  ____________________________________________   LABS (all labs ordered are listed, but only abnormal results are displayed)  Labs Reviewed  COMPREHENSIVE METABOLIC PANEL - Abnormal; Notable for the  following components:      Result Value   CO2 21 (*)    Glucose, Bld 112 (*)    BUN 27 (*)    Creatinine, Ser 1.90 (*)    Albumin 3.0 (*)    GFR calc non Af Amer 35 (*)    GFR calc Af Amer 41 (*)    All other components within normal limits  CBC - Abnormal; Notable for the following components:   WBC 12.8 (*)    RBC 3.45 (*)    Hemoglobin 11.0 (*)    HCT 32.5 (*)    All other components within normal limits  RESPIRATORY PANEL BY RT PCR (FLU A&B, COVID)  PROTIME-INR  APTT  HEMOGLOBIN A1C  HIV ANTIBODY (ROUTINE TESTING W REFLEX)  HEMOGLOBIN  POC OCCULT BLOOD, ED  TYPE AND SCREEN   ____________________________________________  EKG  ED ECG REPORT I, Hinda Kehr, the attending physician, personally viewed and interpreted this ECG.  Date: 05/15/2019 EKG Time: 6:37 AM Rate: 90 Rhythm: normal sinus rhythm QRS Axis: normal Intervals: Probable LVH ST/T Wave abnormalities: normal Narrative Interpretation: no evidence of acute ischemia  ____________________________________________  RADIOLOGY I, Hinda Kehr, personally viewed and evaluated these images (plain radiographs) as part of my medical decision making, as well as reviewing the written report by the radiologist.  ED MD interpretation:  acute uncomplicated diverticulitis  Official radiology report(s): CT ABDOMEN PELVIS WO CONTRAST  Result Date: 05/15/2019 CLINICAL DATA:  Hematemesis and rectal bleeding for 4 days. EXAM: CT ABDOMEN AND PELVIS WITHOUT CONTRAST TECHNIQUE: Multidetector CT imaging of the abdomen and pelvis was performed following the standard protocol without IV contrast. COMPARISON:  CT scan and 12/10/2018 FINDINGS: Lower chest: Mild eventration of the right hemidiaphragm with some overlying vascular crowding. No infiltrates, edema or effusions. The heart is normal in size. Age advanced coronary artery and aortic calcifications with dense calcifications involving the LAD and probable stent. Hepatobiliary:  No focal hepatic lesions or intrahepatic biliary dilatation. Layering higher attenuation material in the gallbladder could be sludge or gallstones or both. No findings for acute cholecystitis. No common bile duct dilatation. Pancreas: No mass, inflammation or ductal dilatation. Spleen: Normal size. No focal lesions. Adrenals/Urinary Tract: The adrenal glands and kidneys are unremarkable. No renal, ureteral or bladder calculi or mass. Simple right renal cyst is unchanged. The bladder is unremarkable. Stomach/Bowel: The stomach, duodenum and small bowel are grossly normal without  oral contrast. No acute inflammatory process, mass lesion or obstructive findings. The terminal ileum and appendix appear normal. Diffuse and advanced colonic diverticulosis most severe in the sigmoid area. There are changes of acute uncomplicated diverticulitis involving the upper sigmoid colon. No evidence of colonic mass or obstruction. Vascular/Lymphatic: Age advanced atherosclerotic calcifications involving the aorta and branch vessel ostia. No aneurysm. No mesenteric or retroperitoneal mass or adenopathy. Reproductive: The prostate gland and seminal vesicles are unremarkable. Other: No pelvic mass or adenopathy. No free pelvic fluid collections. No inguinal mass or adenopathy. No abdominal wall hernia or subcutaneous lesions. Musculoskeletal: No significant bony findings. Chronic left hip AVN. IMPRESSION: 1. Acute uncomplicated diverticulitis involving the upper sigmoid colon. 2. Diffuse and advanced colonic diverticulosis. 3. Age advanced atherosclerotic calcifications involving the aorta and branch vessels and coronary arteries. 4. Layering higher attenuation material in the gallbladder could be sludge or gallstones or both. 5. Chronic left hip AVN. Aortic Atherosclerosis (ICD10-I70.0). Electronically Signed   By: Marijo Sanes M.D.   On: 05/15/2019 07:00     ____________________________________________   PROCEDURES   Procedure(s) performed (including Critical Care):  .1-3 Lead EKG Interpretation Performed by: Hinda Kehr, MD Authorized by: Hinda Kehr, MD     Interpretation: normal     ECG rate:  95   ECG rate assessment: normal     Rhythm: sinus rhythm     Ectopy: none     Conduction: normal       ____________________________________________   INITIAL IMPRESSION / MDM / ASSESSMENT AND PLAN / ED COURSE  As part of my medical decision making, I reviewed the following data within the [place] notes reviewed and incorporated, Labs reviewed , EKG interpreted , Old chart reviewed, Discussed with admitting physician (Dr. Damita Dunnings) and reviewed Notes from prior ED visits   Differential diagnosis includes, but is not limited to, AV malformation, variceal bleeding, neoplasm, diverticulosis.  The patient is on Eliquis which is likely complicating the presentation but it is somewhat unusual he is having both bright red hematochezia and bright red hematemesis.  Generally benign abdominal exam with no focal tenderness and no evidence of peritonitis or even localized peritonitis, but I will obtain a CT abdomen/pelvis without contrast given his chronic kidney disease and the need to evaluate any gross abnormalities.  He is quite nauseated at this time and would not be able to tolerate the oral contrast.  His vital signs are generally stable.  Of the reported bleeding his hemoglobin is still 11.0 which is reassuring but it may drop on repeat given that he has had several episodes of hematochezia today.  Creatinine is stable from prior but he does appear a little bit dry and admits to not eating or drinking much for a few days.  I have ordered 1 L of normal saline IV bolus followed by maintenance rate of 100 mL/h.  Given the unclear history I have also ordered pantoprazole 80 mg IV followed by a rate of 80 mg/h.  He  reports that he used to drink very heavily but that he stopped drinking back in the 1980s and he is unaware of having a diagnosis of cirrhosis.  I will not start octreotide at this time.  2 large-bore peripheral IVs are being established.  Patient will benefit from admission and gastroenterology consultation.  The patient is on the cardiac monitor to evaluate for evidence of arrhythmia and/or significant heart rate changes.       Clinical Course as of Apr  [place] May 15, 2019  0621 Discussed case by phone with Dr. Damita Dunnings with the hospitalist service who will admit.   [CF]  [phone] Patient did not receive any of his multiple anti-HTN meds and is now about 194/113.  Ordering labetalol 10 mg IV to avoid PO intake.  Deferring additional treatment to hospitalist service.   [CF]  [phone] CT notable for acute uncomplicated diverticulitis.  I updated the hospitalist, Dr. Damita Dunnings 1, with a secure [facility] text message to let her know about the results and I ordered Zosyn 3.375 g IV to begin treatment.   [CF]    Clinical Course User Index [CF] Hinda Kehr, MD     ____________________________________________  FINAL CLINICAL IMPRESSION(S) / ED DIAGNOSES  Final diagnoses:  Rectal bleeding  Hematemesis with nausea  Nausea  Current use of long term anticoagulation  Chronic kidney disease, unspecified CKD stage  Essential hypertension  Diverticulitis     MEDICATIONS GIVEN DURING THIS VISIT:  Medications  sodium chloride 0.9 % bolus 1,000 mL (1,000 mLs Intravenous New Bag/Given 05/15/19 0652)    And  0.9 %  sodium chloride infusion (has no administration in time range)  pantoprazole (PROTONIX) 80 mg in sodium chloride 0.9 % 100 mL IVPB (has no administration in time range)  pantoprazole (PROTONIX) 80 mg in sodium chloride 0.9 % 100 mL (0.8 mg/mL) infusion (8 mg/hr Intravenous New Bag/Given 05/15/19 0653)  0.9 %  sodium chloride infusion (Manually program via Guardrails IV Fluids) (has no  administration in time range)  insulin aspart (novoLOG) injection 0-9 Units (has no administration in time range)  ondansetron (ZOFRAN) tablet 4 mg (has no administration in time range)    Or  ondansetron (ZOFRAN) injection 4 mg (has no administration in time range)  labetalol (NORMODYNE) injection 10 mg (has no administration in time range)  piperacillin-tazobactam (ZOSYN) IVPB 3.375 g (has no administration in time range)  ondansetron (ZOFRAN) injection 4 mg (4 mg Intravenous Given 05/15/19 7680)     ED Discharge Orders    None      *Please note:  Casey French was evaluated in Emergency Department on 05/15/2019 for the symptoms described in the history of present illness. He was evaluated in the context of the global COVID-19 pandemic, which necessitated consideration that the patient might be at risk for infection with the SARS-CoV-2 virus that causes COVID-19. Institutional protocols and algorithms that pertain to the evaluation of patients at risk for COVID-19 are in a state of rapid change based on information released by regulatory bodies including the CDC and federal and state organizations. These policies and algorithms were followed during the patient's care in the ED.  Some ED evaluations and interventions may be delayed as a result of limited staffing during the pandemic.*  Note:  This document was prepared using Dragon voice recognition software and may include unintentional dictation errors.   Hinda Kehr, MD 05/15/19 [phone]

## 2019-05-15 NOTE — ED Notes (Signed)
Pt transported to CT ?

## 2019-05-15 NOTE — Consult Note (Signed)
Lucilla Lame, MD [facility]  [address], [place], [place] [address] Phone: [phone] Fax : [phone]  Consultation  Referring Provider:     Dr. Francine Graven Primary Care Physician:  [name], MD Primary Gastroenterologist:  [facility]         Reason for Consultation:     Hematochezia and hematemesis  Date of Admission:  05/15/2019 Date of Consultation:  05/15/2019         HPI:   Casey French is a 69 y.o. male Who comes in with a history of diverticulitis in the past and his last colonoscopy was in March 2019 showing diverticulosis. The patient reports that he started having left lower quadrant pain and some rectal bleeding which resulted in him feeling sick to his stomach and vomiting.  The patient had one episode of vomiting without any blood in it and then reports that he vomited shortly after that and there was a small amount of blood in the vomitus.  During the workup in the emergency room the patient underwent a CT scan of the abdomen that showed the patient to have diverticulitis.  The patient is on a liquids with a history of A. Fib.  He reports that the rectal bleeding has been persistent for approximately 4 days prior to being admitted.  He now reports that he has not had any further bleeding since he came into the hospital.  The patient's hemoglobin in November of last year was 11.1 with his hemoglobin yesterday being 11.0 and today being 10.7. He also had a white cell count on admission of 12.8.  It appears the patient had a previous admission on December 10, 2018 with a diagnosis of diverticulitis at that time.On of his the emergency room on December 2018 it appears that the patient had a CT scan for similar pain and was found to have findings consistent with possible diverticulitis.  Past Medical History:  Diagnosis Date  . Depression   . Diabetes mellitus without complication ([place])   . Gout   . High cholesterol   . Hypertension   . Myocardial infarction [facility])      Past Surgical History:  Procedure Laterality Date  . CARDIAC CATHETERIZATION    . COLONOSCOPY WITH PROPOFOL N/A 04/01/2017   Procedure: COLONOSCOPY WITH PROPOFOL;  Surgeon: Lollie Sails, MD;  Location: [facility] ENDOSCOPY;  Service: Endoscopy;  Laterality: N/A;  . right hand surgery     3 screws in right hand    Prior to Admission medications   Medication Sig Start Date End Date Taking? Authorizing Provider  allopurinol (ZYLOPRIM) 100 MG tablet Take 100 mg by mouth daily.   Yes [provider]  aspirin EC 81 MG tablet Take 81 mg by mouth daily.   Yes [provider]  ELIQUIS 5 MG TABS tablet Take 5 mg by mouth daily. 08/16/16  Yes [provider]  fluticasone (FLONASE) 50 MCG/ACT nasal spray Place 2 sprays into both nostrils daily. 04/30/14  Yes [provider]  gabapentin (NEURONTIN) 300 MG capsule Take 300 mg by mouth at bedtime. 09/25/16  Yes [provider]  hydrochlorothiazide (HYDRODIURIL) 25 MG tablet Take 37.5 mg by mouth daily.  03/31/19  Yes [provider]  isosorbide mononitrate (IMDUR) 60 MG 24 hr tablet Take 60 mg by mouth daily.   Yes [provider]  lisinopril (ZESTRIL) 10 MG tablet Take 10 mg by mouth daily. 06/30/18  Yes [provider]  metFORMIN (GLUCOPHAGE-XR) 500 MG 24  hr tablet Take 500 mg by mouth 2 (two) times daily.    Yes [provider]  metoprolol succinate (TOPROL-XL) 100 MG 24 hr tablet Take 100 mg by mouth daily.   Yes [provider]  omeprazole (PRILOSEC) 20 MG capsule Take 20 mg by mouth daily.   Yes [provider]  psyllium (METAMUCIL) 58.6 % packet Take 1 packet by mouth in the morning and at bedtime.   Yes [provider]  rosuvastatin (CRESTOR) 40 MG tablet Take 40 mg by mouth daily. 03/06/19  Yes [provider]  ciprofloxacin (CIPRO) 500 MG tablet Take 1 tablet (500 mg total) by mouth 2 (two) times daily. Patient not taking: Reported on  05/15/2019 06/26/18   Lavonia Drafts, MD  nitroGLYCERIN (NITROSTAT) 0.4 MG SL tablet Place 0.4 mg under the tongue every 5 (five) minutes as needed for chest pain.    [provider]  ondansetron (ZOFRAN ODT) 8 MG disintegrating tablet Take 1 tablet (8 mg total) by mouth every 8 (eight) hours as needed for nausea or vomiting. Patient not taking: Reported on 05/15/2019 12/10/18   Arta Silence, MD  oxyCODONE-acetaminophen (ROXICET) 5-325 MG tablet Take 1 tablet by mouth every 6 (six) hours as needed for severe pain. Patient not taking: Reported on 11/10/2016 10/16/16   Darel Hong, MD  oxyCODONE-acetaminophen (ROXICET) 5-325 MG tablet Take 1 tablet by mouth every 6 (six) hours as needed. Patient not taking: Reported on 04/01/2017 01/13/17   Loney Hering, MD  traMADol (ULTRAM) 50 MG tablet Take 1 tablet (50 mg total) by mouth every 6 (six) hours as needed. Patient not taking: Reported on 05/15/2019 06/26/18 06/26/19  Lavonia Drafts, MD    Family History  Problem Relation Age of Onset  . CAD Other      Social History   Tobacco Use  . Smoking status: Former Smoker    Quit date: 03/24/2001    Years since quitting: 18.1  . Smokeless tobacco: Never Used  Substance Use Topics  . Alcohol use: No    Alcohol/week: 0.0 standard drinks  . Drug use: No    Allergies as of 05/14/2019 - Review Complete 05/14/2019  Allergen Reaction Noted  . Morphine Anxiety 06/19/2014  . Morphine and related Anxiety 04/30/2015    Review of Systems:    All systems reviewed and negative except where noted in HPI.   Physical Exam:  Vital signs in last 24 hours: Temp:  [97.6 F (36.4 C)-98.3 F (36.8 C)] 98.3 F (36.8 C) (04/20 0440) Pulse Rate:  [75-95] 88 (04/20 1027) Resp:  [16-23] 20 (04/20 1027) BP: (140-174)/(66-98) 148/88 (04/20 1027) SpO2:  [94 %-100 %] 100 % (04/20 1027) Weight:  [73 kg] 73 kg (04/19 2151)   General:   Pleasant, cooperative in NAD Head:  Normocephalic and  atraumatic. Eyes:   No icterus.   Conjunctiva pink. PERRLA. Ears:  Normal auditory acuity. Neck:  Supple; no masses or thyroidomegaly Lungs: Respirations even and unlabored. Lungs clear to auscultation bilaterally.   No wheezes, crackles, or rhonchi.  Heart:  Regular rate and rhythm;  Without murmur, clicks, rubs or gallops Abdomen:  Soft, nondistended, Mild left lower quadrant tenderness. Normal bowel sounds. No appreciable masses or hepatomegaly.  No rebound or guarding.  Rectal:  Not performed. Msk:  Symmetrical without gross deformities.   Extremities:  Without edema, cyanosis or clubbing. Neurologic:  Alert and oriented x3;  grossly normal neurologically. Skin:  Intact without significant lesions or rashes. Cervical Nodes:  No  significant cervical adenopathy. Psych:  Alert and cooperative. Normal affect.  LAB RESULTS: Recent Labs    05/14/19 2155 05/15/19 0640  WBC 12.8*  --   HGB 11.0* 10.7*  HCT 32.5*  --   PLT 234  --    BMET Recent Labs    05/14/19 2155  NA 141  K 4.1  CL 111  CO2 21*  GLUCOSE 112*  BUN 27*  CREATININE 1.90*  CALCIUM 9.0   LFT Recent Labs    05/14/19 2155  PROT 6.7  ALBUMIN 3.0*  AST 22  ALT 14  ALKPHOS 72  BILITOT 0.8   PT/INR Recent Labs    05/15/19 0633  LABPROT 13.3  INR 1.0    STUDIES: CT ABDOMEN PELVIS WO CONTRAST  Result Date: 05/15/2019 CLINICAL DATA:  Hematemesis and rectal bleeding for 4 days. EXAM: CT ABDOMEN AND PELVIS WITHOUT CONTRAST TECHNIQUE: Multidetector CT imaging of the abdomen and pelvis was performed following the standard protocol without IV contrast. COMPARISON:  CT scan and 12/10/2018 FINDINGS: Lower chest: Mild eventration of the right hemidiaphragm with some overlying vascular crowding. No infiltrates, edema or effusions. The heart is normal in size. Age advanced coronary artery and aortic calcifications with dense calcifications involving the LAD and probable stent. Hepatobiliary: No focal hepatic  lesions or intrahepatic biliary dilatation. Layering higher attenuation material in the gallbladder could be sludge or gallstones or both. No findings for acute cholecystitis. No common bile duct dilatation. Pancreas: No mass, inflammation or ductal dilatation. Spleen: Normal size. No focal lesions. Adrenals/Urinary Tract: The adrenal glands and kidneys are unremarkable. No renal, ureteral or bladder calculi or mass. Simple right renal cyst is unchanged. The bladder is unremarkable. Stomach/Bowel: The stomach, duodenum and small bowel are grossly normal without oral contrast. No acute inflammatory process, mass lesion or obstructive findings. The terminal ileum and appendix appear normal. Diffuse and advanced colonic diverticulosis most severe in the sigmoid area. There are changes of acute uncomplicated diverticulitis involving the upper sigmoid colon. No evidence of colonic mass or obstruction. Vascular/Lymphatic: Age advanced atherosclerotic calcifications involving the aorta and branch vessel ostia. No aneurysm. No mesenteric or retroperitoneal mass or adenopathy. Reproductive: The prostate gland and seminal vesicles are unremarkable. Other: No pelvic mass or adenopathy. No free pelvic fluid collections. No inguinal mass or adenopathy. No abdominal wall hernia or subcutaneous lesions. Musculoskeletal: No significant bony findings. Chronic left hip AVN. IMPRESSION: 1. Acute uncomplicated diverticulitis involving the upper sigmoid colon. 2. Diffuse and advanced colonic diverticulosis. 3. Age advanced atherosclerotic calcifications involving the aorta and branch vessels and coronary arteries. 4. Layering higher attenuation material in the gallbladder could be sludge or gallstones or both. 5. Chronic left hip AVN. Aortic Atherosclerosis (ICD10-I70.0). Electronically Signed   By: Marijo Sanes M.D.   On: 05/15/2019 07:00      Impression / Plan:   Assessment: Principal Problem:   GI bleed Active Problems:    Benign essential HTN   DM type 2 (diabetes mellitus, type 2) (HCC)   CAD (coronary artery disease)   Hematemesis   Hematochezia   Chronic anticoagulation   Atrial fibrillation, chronic (HCC)   CKD (chronic kidney disease), stage III   Casey French is a 69 y.o. y/o male with Diverticulitis and hematochezia with hematemesis.  The patient reports that his first episode of vomiting did not have any blood with the vomitus and only saw blood on his second time of vomiting.  The patient has also had recurrent episodes of diverticulitis and  up to 10% of patients with diverticulitis can presents with rectal bleeding.   Plan:  The plan for this patient should be treated with diverticulitis and I do not recommend any endoscopic procedures since his upper GI bleed is likely from a Mallory-Weiss tear and has not reoccurred since coming to the hospital.  He has also had a stable hemoglobin.  As far as a colonoscopy the patient has acute diverticulitis which is a contraindication to doing any endoscopic procedures.  The patient should be treated with antibiotics for his uncomplicated diverticulitis and follow-up with his gastroenterologist at [facility] clinic after discharge.  The patient has been explained the plan and agrees with it.  Thank you for involving me in the care of this patient.      LOS: 0 days   Lucilla Lame, MD  05/15/2019, 4:31 PM Pager [phone] 7am-5pm  Check AMION for 5pm -7am coverage and on weekends   Note: This dictation was prepared with Dragon dictation along with smaller phrase technology. Any transcriptional errors that result from this process are unintentional.

## 2019-05-15 NOTE — ED Notes (Signed)
Attempted to call report, RN unavailable at this time.

## 2019-05-16 DIAGNOSIS — N189 Chronic kidney disease, unspecified: Secondary | ICD-10-CM

## 2019-05-16 DIAGNOSIS — I482 Chronic atrial fibrillation, unspecified: Secondary | ICD-10-CM | POA: Diagnosis not present

## 2019-05-16 DIAGNOSIS — I2510 Atherosclerotic heart disease of native coronary artery without angina pectoris: Secondary | ICD-10-CM | POA: Diagnosis not present

## 2019-05-16 DIAGNOSIS — K5792 Diverticulitis of intestine, part unspecified, without perforation or abscess without bleeding: Secondary | ICD-10-CM | POA: Diagnosis not present

## 2019-05-16 DIAGNOSIS — I10 Essential (primary) hypertension: Secondary | ICD-10-CM | POA: Diagnosis not present

## 2019-05-16 LAB — HEMOGLOBIN A1C
Hgb A1c MFr Bld: 6 % — ABNORMAL HIGH (ref 4.8–5.6)
Mean Plasma Glucose: 125.5 mg/dL

## 2019-05-16 LAB — GLUCOSE, CAPILLARY
Glucose-Capillary: 85 mg/dL (ref 70–99)
Glucose-Capillary: 85 mg/dL (ref 70–99)
Glucose-Capillary: 98 mg/dL (ref 70–99)

## 2019-05-16 MED ORDER — PANTOPRAZOLE SODIUM 40 MG PO TBEC
40.0000 mg | DELAYED_RELEASE_TABLET | Freq: Every day | ORAL | Status: DC
  Administered 2019-05-16: 40 mg via ORAL
  Filled 2019-05-16: qty 1

## 2019-05-16 MED ORDER — HYDROCHLOROTHIAZIDE 25 MG PO TABS
37.5000 mg | ORAL_TABLET | Freq: Every day | ORAL | Status: DC
  Administered 2019-05-16: 09:00:00 37.5 mg via ORAL
  Filled 2019-05-16: qty 2

## 2019-05-16 MED ORDER — METFORMIN HCL ER 500 MG PO TB24: 500.0000 mg | ORAL_TABLET | Freq: Every day | ORAL | 0 refills | Status: DC

## 2019-05-16 MED ORDER — AMOXICILLIN-POT CLAVULANATE 875-125 MG PO TABS
1.0000 | ORAL_TABLET | Freq: Two times a day (BID) | ORAL | Status: DC
  Administered 2019-05-16: 1 via ORAL
  Filled 2019-05-16: qty 1

## 2019-05-16 MED ORDER — AMOXICILLIN-POT CLAVULANATE 875-125 MG PO TABS: 1.0000 | ORAL_TABLET | Freq: Two times a day (BID) | ORAL | 0 refills | Status: AC

## 2019-05-16 MED ORDER — ISOSORBIDE MONONITRATE ER 60 MG PO TB24
60.0000 mg | ORAL_TABLET | Freq: Every day | ORAL | Status: DC
  Administered 2019-05-16: 09:00:00 60 mg via ORAL
  Filled 2019-05-16: qty 1

## 2019-05-16 MED ORDER — ALLOPURINOL 100 MG PO TABS
100.0000 mg | ORAL_TABLET | Freq: Every day | ORAL | Status: DC
  Administered 2019-05-16: 12:00:00 100 mg via ORAL
  Filled 2019-05-16: qty 1

## 2019-05-16 MED ORDER — ROSUVASTATIN CALCIUM 10 MG PO TABS
40.0000 mg | ORAL_TABLET | Freq: Every day | ORAL | Status: DC
  Administered 2019-05-16: 40 mg via ORAL
  Filled 2019-05-16: qty 4

## 2019-05-16 MED ORDER — NITROGLYCERIN 0.4 MG SL SUBL: 0.4000 mg | SUBLINGUAL_TABLET | SUBLINGUAL | Status: DC | PRN

## 2019-05-16 MED ORDER — GABAPENTIN 300 MG PO CAPS: 300.0000 mg | ORAL_CAPSULE | Freq: Every day | ORAL | Status: DC

## 2019-05-16 MED ORDER — METOPROLOL SUCCINATE ER 100 MG PO TB24
100.0000 mg | ORAL_TABLET | Freq: Every day | ORAL | Status: DC
  Administered 2019-05-16: 100 mg via ORAL
  Filled 2019-05-16: qty 1

## 2019-05-16 NOTE — Discharge Summary (Signed)
[facility] NAME: Casey French    MR#:  [id number]  DATE OF BIRTH:  [birth year]  DATE OF ADMISSION:  05/15/2019 ADMITTING PHYSICIAN: Athena Masse, MD  DATE OF DISCHARGE: 4/  PRIMARY CARE PHYSICIAN: [name], MD    ADMISSION DIAGNOSIS:  Diverticulitis [K57.92] Nausea [R11.0] Rectal bleeding [K62.5] GI bleed [K92.2] Current use of long term anticoagulation [Z79.01] Essential hypertension [I10] Hematemesis with nausea [K92.0] Chronic kidney disease, unspecified CKD stage [N18.9]  DISCHARGE DIAGNOSIS:  Acute Diverticulitis  SECONDARY DIAGNOSIS:   Past Medical History:  Diagnosis Date  . Depression   . Diabetes mellitus without complication ([place])   . Gout   . High cholesterol   . Hypertension   . Myocardial infarction [facility])     HOSPITAL COURSE:   Casey French is a 69 y.o. male with medical history significant for paroxysmal atrial fibrillation on chronic anticoagulation therapy with Eliquis, coronary artery disease status post stent angioplasty, depression and diabetes mellitus who presents to the emergency room for evaluation of a 4-day history of persistent and worsening rectal bleed. Patient states that he has hemorrhoids and noted blood each time he wiped after a bowel movement but then blood has continued to increase.  Acute Diverticulitis -Patient presents for evaluation of rectal bleed and CT scan of abdomen and pelvis showed acute uncomplicated diverticulitis involving the upper sigmoid colon. Diffuse and advanced colonic diverticulosis -Continue empiric antibiotic therapy with Zosyn --change to oral Augmentin -Seen by GI dr Monika Salk with cont abxs, ok to resume eliquis, and f/u GI in next few weeks. No GI evaluation needed -FLD for few days -Hold asa for 1 week and then resume -no BRPR or hemetemesis -hgb down to 9.1 due to GI bleed and IVF dilution. Pt asymptomatic  Diabetes mellitus  with chronic kidney disease stage III -decreased metforminXR 500 mg to qd Sugars well controlled -A1c 6.0 -Glycemic control  Paroxysmal atrial fibrillation on chronic anticoagulation therapy -ok to resume Eliquis per GI since no active bleeding -cont metoprolol  History of coronary artery disease Hold aspirin due to GI bleed -no cp or sob -resumed BB, lisinopril, statins  Overall feels ok. Mild weakness Tolerating po CLD--FLD later today and then d/c home   DVT prophylaxis:  SCD Code Status: Full Family Communication: Plan of care was discussed with patient and his wife is aware Disposition Plan: Back to previous home environment today Consults called: GI Does patient need palliative care consult : No  CONSULTS OBTAINED:  Treatment Team:  Lucilla Lame, MD Jonathon Bellows, MD  DRUG ALLERGIES:   Allergies  Allergen Reactions  . Morphine Anxiety    Other reaction(s): Other (See Comments) agitation   . Morphine And Related Anxiety    DISCHARGE MEDICATIONS:   Allergies as of 05/16/2019      Reactions   Morphine Anxiety   Other reaction(s): Other (See Comments) agitation   Morphine And Related Anxiety      Medication List    STOP taking these medications   aspirin EC 81 MG tablet   ciprofloxacin 500 MG tablet Commonly known as: Cipro   ondansetron 8 MG disintegrating tablet Commonly known as: Zofran ODT     TAKE these medications   allopurinol 100 MG tablet Commonly known as: ZYLOPRIM Take 100 mg by mouth daily.   amoxicillin-clavulanate 875-125 MG tablet Commonly known as: AUGMENTIN Take 1 tablet by mouth every 12 (twelve) hours for 8 days.   Eliquis 5 MG  Tabs tablet Generic drug: apixaban Take 5 mg by mouth daily.   fluticasone 50 MCG/ACT nasal spray Commonly known as: FLONASE Place 2 sprays into both nostrils daily.   gabapentin 300 MG capsule Commonly known as: NEURONTIN Take 300 mg by mouth at bedtime.   hydrochlorothiazide 25 MG  tablet Commonly known as: HYDRODIURIL Take 37.5 mg by mouth daily.   isosorbide mononitrate 60 MG 24 hr tablet Commonly known as: IMDUR Take 60 mg by mouth daily.   lisinopril 10 MG tablet Commonly known as: ZESTRIL Take 10 mg by mouth daily.   metFORMIN 500 MG 24 hr tablet Commonly known as: GLUCOPHAGE-XR Take 1 tablet (500 mg total) by mouth daily with breakfast. What changed: when to take this   metoprolol succinate 100 MG 24 hr tablet Commonly known as: TOPROL-XL Take 100 mg by mouth daily.   nitroGLYCERIN 0.4 MG SL tablet Commonly known as: NITROSTAT Place 0.4 mg under the tongue every 5 (five) minutes as needed for chest pain.   omeprazole 20 MG capsule Commonly known as: PRILOSEC Take 20 mg by mouth daily.   psyllium 58.6 % packet Commonly known as: METAMUCIL Take 1 packet by mouth in the morning and at bedtime.   rosuvastatin 40 MG tablet Commonly known as: CRESTOR Take 40 mg by mouth daily.       If you experience worsening of your admission symptoms, develop shortness of breath, life threatening emergency, suicidal or homicidal thoughts you must seek medical attention immediately by calling 911 or calling your MD immediately  if symptoms less severe.  You Must read complete instructions/literature along with all the possible adverse reactions/side effects for all the Medicines you take and that have been prescribed to you. Take any new Medicines after you have completely understood and accept all the possible adverse reactions/side effects.   Please note  You were cared for by a hospitalist during your hospital stay. If you have any questions about your discharge medications or the care you received while you were in the hospital after you are discharged, you can call the unit and asked to speak with the hospitalist on call if the hospitalist that took care of you is not available. Once you are discharged, your primary care physician will handle any further  medical issues. Please note that NO REFILLS for any discharge medications will be authorized once you are discharged, as it is imperative that you return to your primary care physician (or establish a relationship with a primary care physician if you do not have one) for your aftercare needs so that they can reassess your need for medications and monitor your lab values. Today   SUBJECTIVE   No BRBPR or vomiting tolerating CLD denies abd pain  VITAL SIGNS:  Blood pressure (!) 166/85, pulse 84, temperature 98.1 F (36.7 C), temperature source Oral, resp. rate 18, height 5\' 9"  (1.753 m), weight 70.9 kg, SpO2 96 %.  I/O:    Intake/Output Summary (Last 24 hours) at 05/16/2019 1009 Last data filed at 05/16/2019 1008 Gross per 24 hour  Intake 0 ml  Output 925 ml  Net -925 ml    PHYSICAL EXAMINATION:  GENERAL:  69 y.o.-year-old patient lying in the bed with no acute distress.  EYES: Pupils equal, round, reactive to light and accommodation. No scleral icterus.  HEENT: Head atraumatic, normocephalic. Oropharynx and nasopharynx clear.  NECK:  Supple, no jugular venous distention. No thyroid enlargement, no tenderness.  LUNGS: Normal breath sounds bilaterally, no wheezing, rales,rhonchi or  crepitation. No use of accessory muscles of respiration.  CARDIOVASCULAR: S1, S2 normal. No murmurs, rubs, or gallops.  ABDOMEN: Soft, non-tender, non-distended. Bowel sounds present. No organomegaly or mass.  EXTREMITIES: No pedal edema, cyanosis, or clubbing.  NEUROLOGIC: Cranial nerves II through XII are intact. Muscle strength 5/5 in all extremities. Sensation intact. Gait not checked.  PSYCHIATRIC: The patient is alert and oriented x 3.  SKIN: No obvious rash, lesion, or ulcer.   DATA REVIEW:   CBC  Recent Labs  Lab 05/14/19 2155 05/15/19 0640 05/15/19 1929  WBC 12.8*  --   --   HGB 11.0*   < > 9.1*  HCT 32.5*  --  26.8*  PLT 234  --   --    < > = values in this interval not displayed.     Chemistries  Recent Labs  Lab 05/14/19 2155  NA 141  K 4.1  CL 111  CO2 21*  GLUCOSE 112*  BUN 27*  CREATININE 1.90*  CALCIUM 9.0  AST 22  ALT 14  ALKPHOS 72  BILITOT 0.8    Microbiology Results   Recent Results (from the past 240 hour(s))  Respiratory Panel by RT PCR (Flu A&B, Covid) - Nasopharyngeal Swab     Status: None   Collection Time: 05/15/19  6:33 AM   Specimen: Nasopharyngeal Swab  Result Value Ref Range Status   SARS Coronavirus 2 by RT PCR NEGATIVE NEGATIVE Final    Comment: (NOTE) SARS-CoV-2 target nucleic acids are NOT DETECTED. The SARS-CoV-2 RNA is generally detectable in upper respiratoy specimens during the acute phase of infection. The lowest concentration of SARS-CoV-2 viral copies this assay can detect is 131 copies/mL. A negative result does not preclude SARS-Cov-2 infection and should not be used as the sole basis for treatment or other patient management decisions. A negative result may occur with  improper specimen collection/handling, submission of specimen other than nasopharyngeal swab, presence of viral mutation(s) within the areas targeted by this assay, and inadequate number of viral copies (<131 copies/mL). A negative result must be combined with clinical observations, patient history, and epidemiological information. The expected result is Negative. Fact Sheet for Patients:  PinkCheek.be Fact Sheet for Healthcare Providers:  GravelBags.it This test is not yet ap proved or cleared by the [place] FDA and  has been authorized for detection and/or diagnosis of SARS-CoV-2 by FDA under an Emergency Use Authorization (EUA). This EUA will remain  in effect (meaning this test can be used) for the duration of the COVID-19 declaration under Section 564(b)(1) of the Act, 21 U.S.C. section 360bbb-3(b)(1), unless the authorization is terminated or revoked sooner.    Influenza A  by PCR NEGATIVE NEGATIVE Final   Influenza B by PCR NEGATIVE NEGATIVE Final    Comment: (NOTE) The Xpert Xpress SARS-CoV-2/FLU/RSV assay is intended as an aid in  the diagnosis of influenza from Nasopharyngeal swab specimens and  should not be used as a sole basis for treatment. Nasal washings and  aspirates are unacceptable for Xpert Xpress SARS-CoV-2/FLU/RSV  testing. Fact Sheet for Patients: PinkCheek.be Fact Sheet for Healthcare Providers: GravelBags.it This test is not yet approved or cleared by the [place] FDA and  has been authorized for detection and/or diagnosis of SARS-CoV-2 by  FDA under an Emergency Use Authorization (EUA). This EUA will remain  in effect (meaning this test can be used) for the duration of the  Covid-19 declaration under Section 564(b)(1) of the Act, 21  U.S.C. section 360bbb-3(b)(1), unless  the authorization is  terminated or revoked. Performed at [facility], [address] [place], [place] [address]     RADIOLOGY:  CT ABDOMEN PELVIS WO CONTRAST  Result Date: 05/15/2019 CLINICAL DATA:  Hematemesis and rectal bleeding for 4 days. EXAM: CT ABDOMEN AND PELVIS WITHOUT CONTRAST TECHNIQUE: Multidetector CT imaging of the abdomen and pelvis was performed following the standard protocol without IV contrast. COMPARISON:  CT scan and 12/10/2018 FINDINGS: Lower chest: Mild eventration of the right hemidiaphragm with some overlying vascular crowding. No infiltrates, edema or effusions. The heart is normal in size. Age advanced coronary artery and aortic calcifications with dense calcifications involving the LAD and probable stent. Hepatobiliary: No focal hepatic lesions or intrahepatic biliary dilatation. Layering higher attenuation material in the gallbladder could be sludge or gallstones or both. No findings for acute cholecystitis. No common bile duct dilatation. Pancreas: No mass, inflammation  or ductal dilatation. Spleen: Normal size. No focal lesions. Adrenals/Urinary Tract: The adrenal glands and kidneys are unremarkable. No renal, ureteral or bladder calculi or mass. Simple right renal cyst is unchanged. The bladder is unremarkable. Stomach/Bowel: The stomach, duodenum and small bowel are grossly normal without oral contrast. No acute inflammatory process, mass lesion or obstructive findings. The terminal ileum and appendix appear normal. Diffuse and advanced colonic diverticulosis most severe in the sigmoid area. There are changes of acute uncomplicated diverticulitis involving the upper sigmoid colon. No evidence of colonic mass or obstruction. Vascular/Lymphatic: Age advanced atherosclerotic calcifications involving the aorta and branch vessel ostia. No aneurysm. No mesenteric or retroperitoneal mass or adenopathy. Reproductive: The prostate gland and seminal vesicles are unremarkable. Other: No pelvic mass or adenopathy. No free pelvic fluid collections. No inguinal mass or adenopathy. No abdominal wall hernia or subcutaneous lesions. Musculoskeletal: No significant bony findings. Chronic left hip AVN. IMPRESSION: 1. Acute uncomplicated diverticulitis involving the upper sigmoid colon. 2. Diffuse and advanced colonic diverticulosis. 3. Age advanced atherosclerotic calcifications involving the aorta and branch vessels and coronary arteries. 4. Layering higher attenuation material in the gallbladder could be sludge or gallstones or both. 5. Chronic left hip AVN. Aortic Atherosclerosis (ICD10-I70.0). Electronically Signed   By: Marijo Sanes M.D.   On: 05/15/2019 07:00     CODE STATUS:     Code Status Orders  (From admission, onward)         Start     Ordered   05/15/19 0632  Full code  Continuous     05/15/19 0633        Code Status History    Date Active Date Inactive Code Status Order ID Comments User Context   11/10/2016 1756 11/11/2016 2139 Full Code [id number]  Bettey [place],  MD ED   05/01/2015 0229 05/01/2015 1904 Full Code [id number]  Lance Coon, MD ED   06/14/2014 0113 06/16/2014 1650 Full Code [id number]  Hower, Aaron Mose, MD ED   03/24/2013 0352 03/28/2013 1402 Full Code [id number]  Hosie Spangle, MD Inpatient   Advance Care Planning Activity       TOTAL TIME TAKING CARE OF THIS PATIENT:  40* minutes.    Fritzi Mandes M.D  [facility]    CC: Primary care physician; [name], MD

## 2019-05-16 NOTE — Discharge Instructions (Signed)
Return to the ER if you notice significant BR bleeding. Avoid constipation

## 2019-05-22 IMAGING — CT CT ABD-PELV W/ CM
2 of 5 series · 15 of 46 positions shown, 17 images · IV contrast (APPLIED)
Comparison: 06/13/2014 and earlier.

CLINICAL DATA: 66-year-old male with abdominal pain. Possible
diverticulitis.

EXAM:
CT ABDOMEN AND PELVIS WITH CONTRAST
TECHNIQUE: Multidetector CT imaging of the abdomen and pelvis was performed
using the standard protocol following bolus administration of
intravenous contrast.
CONTRAST:  75mL [id number] IOPAMIDOL (IR89B5-0NN) INJECTION 61%

[Series 2: routine abd/pel with · axial · 0.76mm/px · z∈[-912,-512]mm · 12 of 90 slices shown, 14 images]
[im 5/90  soft-tissue]
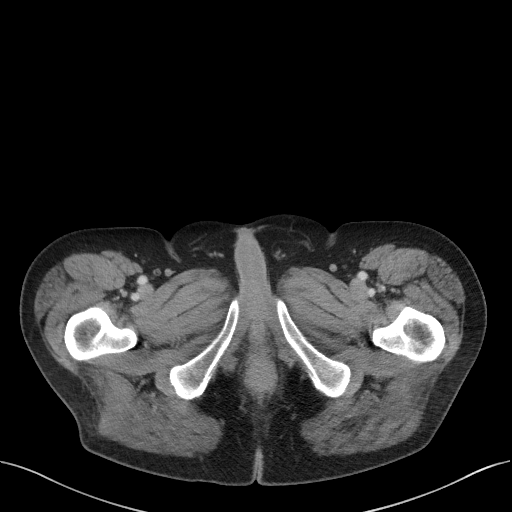
[im 5/90  bone]
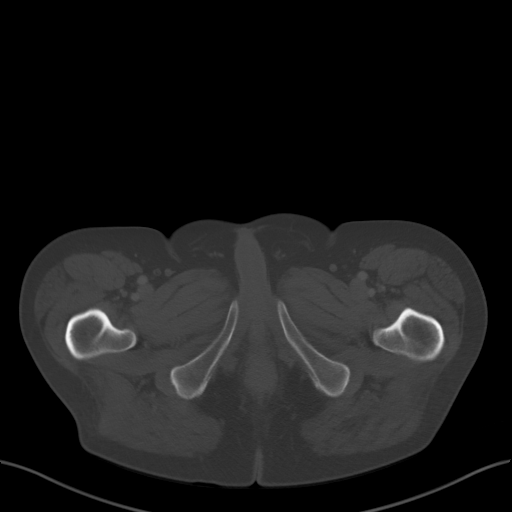
[im 15/90  soft-tissue]
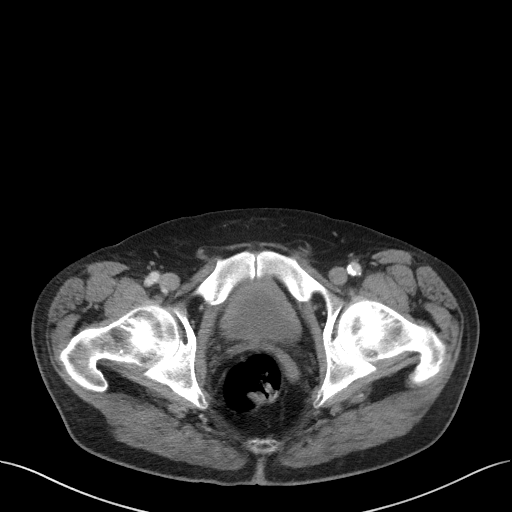
[im 20/90  soft-tissue]
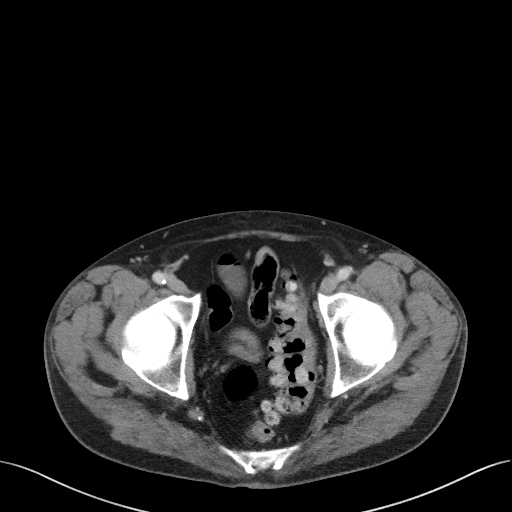
[im 25/90  soft-tissue]
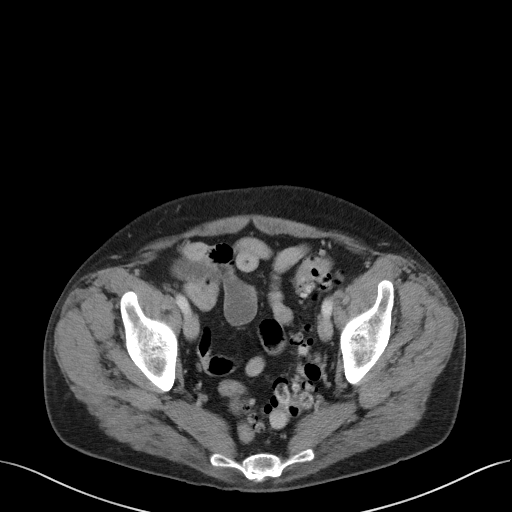
[im 35/90  soft-tissue]
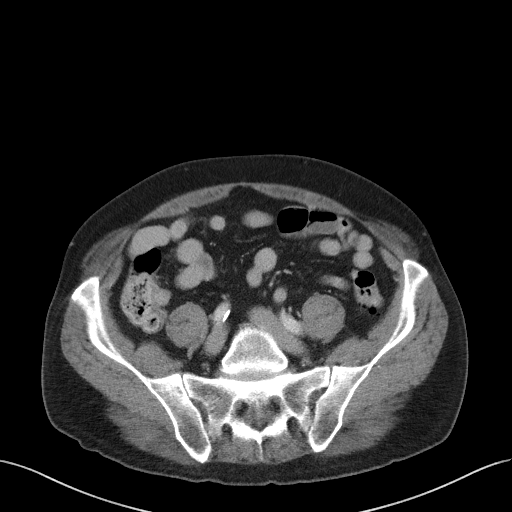
[im 40/90  soft-tissue]
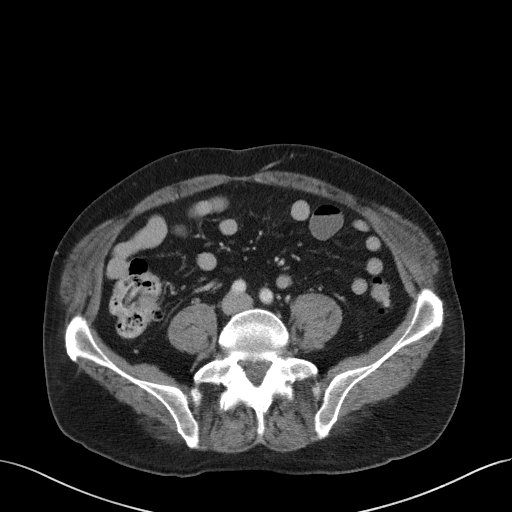
[im 50/90  soft-tissue]
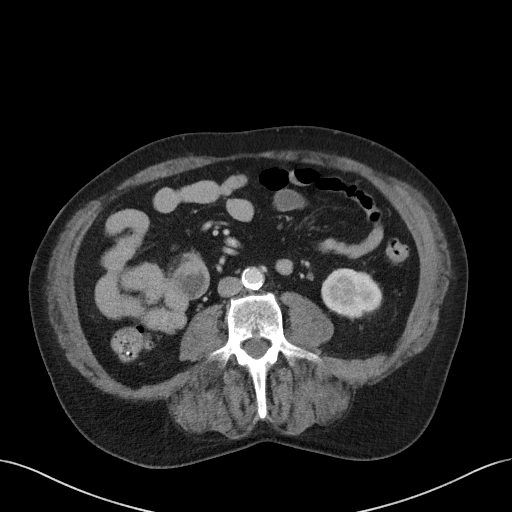
[im 55/90  soft-tissue]
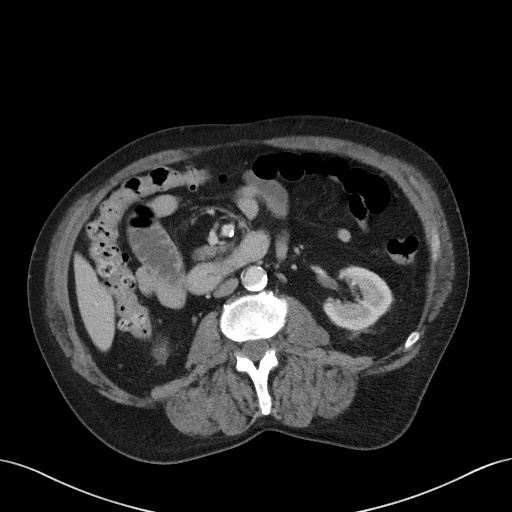
[im 65/90  soft-tissue]
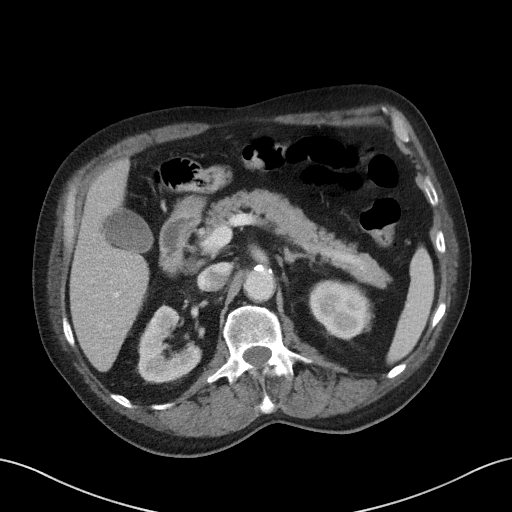
[im 65/90  bone]
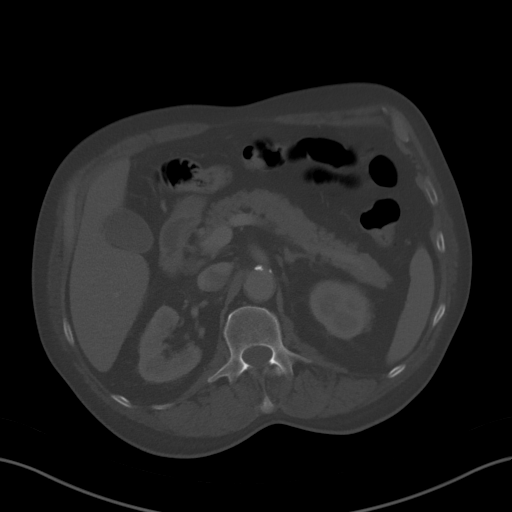
[im 70/90  soft-tissue]
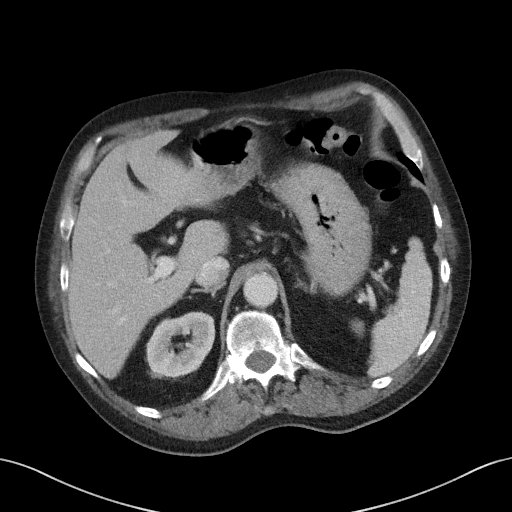
[im 75/90  soft-tissue]
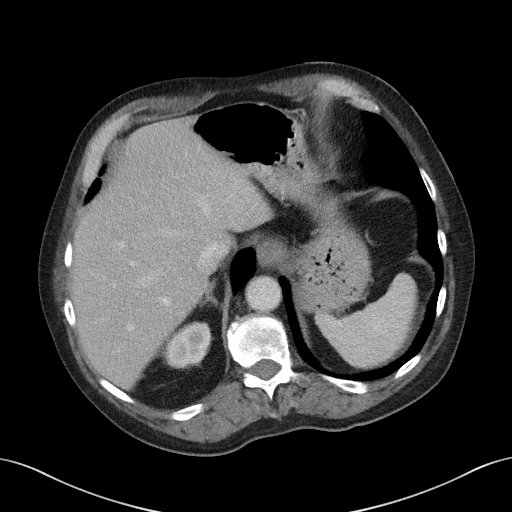
[im 85/90  soft-tissue]
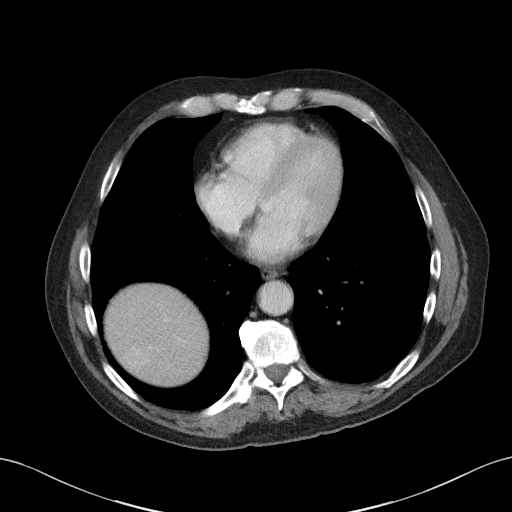

[Series 5: coronal st · coronal · 0.66mm/px · 3 of 109 slices shown]
[im 37/109  soft-tissue]
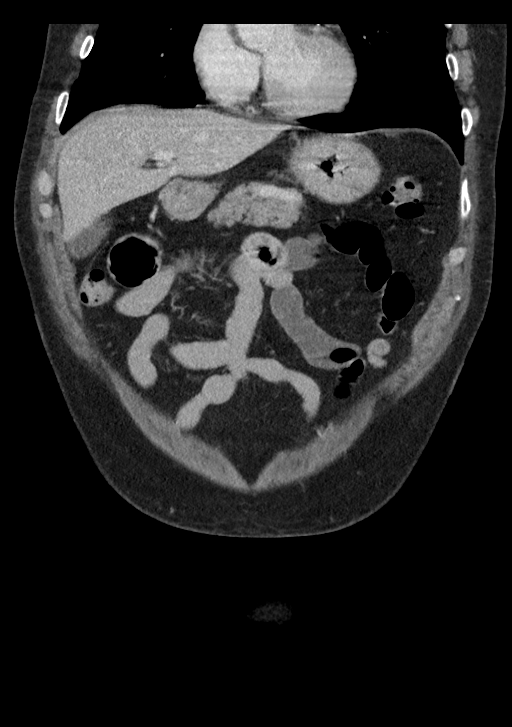
[im 49/109  soft-tissue]
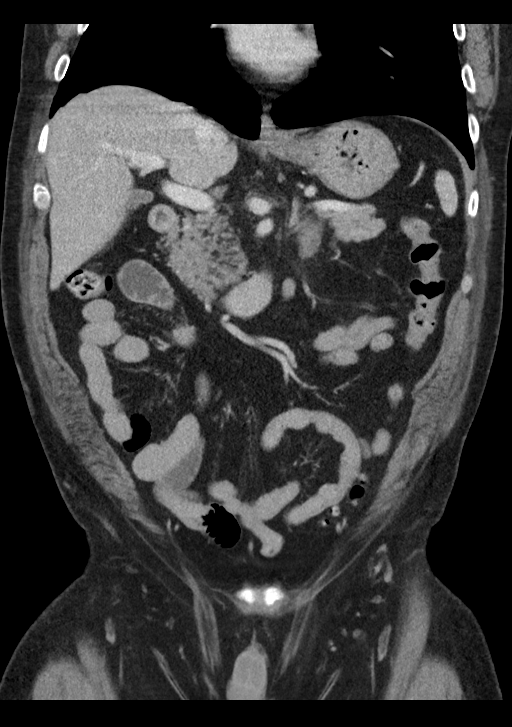
[im 61/109  soft-tissue]
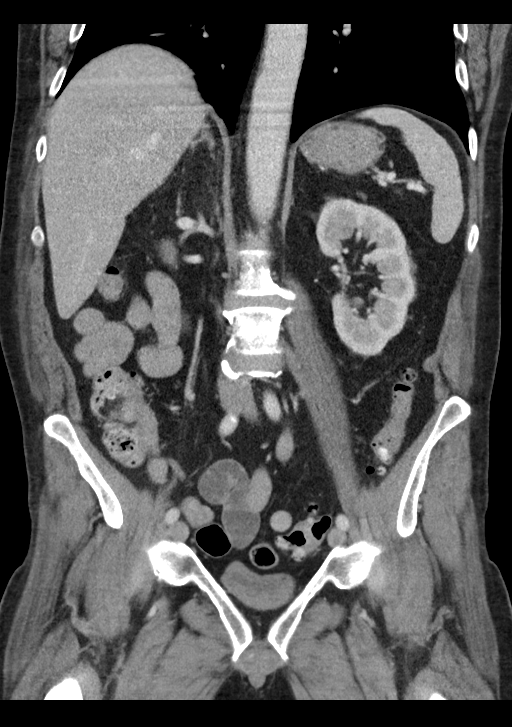

[15 of 46 positions shown; findings below may reference images not displayed]

FINDINGS: Lower chest: Chronic elevation of the right hemidiaphragm. Negative
lung bases. No pericardial or pleural effusion.

Hepatobiliary: Negative liver and gallbladder.

Pancreas: Negative.

Spleen: Negative.

Adrenals/Urinary Tract: Normal adrenal glands. Bilateral renal
enhancement and contrast excretion appears normal. Negative course
of both ureters.

Diminutive and unremarkable urinary bladder.

Stomach/Bowel: Negative rectum. Diverticulosis throughout the
sigmoid colon and continuing into the mid descending colon. The
affected segment of large bowel has an indistinct appearance of the
wall throughout, but there is no active mesenteric inflammation.

Negative splenic flexure. Mild to moderate diverticulosis from the
hepatic flexure to the cecum. Similar indistinct appearance of the
bowel wall with no mesenteric stranding. Normal appendix (series 2,
image 63). Negative terminal ileum. Chronic dystrophic calcification
in the right pericolic gutter.

Intermittent fluid-filled but no suspiciously dilated small bowel
loops. Negative stomach and duodenum; I note that although the
duodenum crosses midline to the left the proximal small bowel loops
are mostly in the right upper quadrant. Mild swirling of the
mesentery in the region is stable.

No abdominal free fluid.

Vascular/Lymphatic: Aortoiliac calcified atherosclerosis. Major
arterial structures in the abdomen and pelvis are patent. Portal
venous system is patent. New line no lymphadenopathy.

Reproductive: Negative.

Other: No pelvic free fluid.

Musculoskeletal: Chronic right L5 pars fracture. No significant
spondylolisthesis. No acute osseous abnormality identified.
IMPRESSION: 1. Diverticulosis in the distal descending and sigmoid colon, and
also in the right colon. Mild diverticulitis is difficult to
exclude, but there is no mesenteric inflammation.
2. Normal appendix, and otherwise no acute or inflammatory process
in the abdomen or pelvis.
3.  Aortic Atherosclerosis ([id number]-EJ1.1).

## 2019-05-22 IMAGING — CR DG CHEST 2V
1 series · 2 of 2 positions shown · non-contrast
Comparison: 08/18/2015

CLINICAL DATA: Chest pain

EXAM:
CHEST  2 VIEW

[Series 1: dg chest 2 view · 0.14mm/px · 2 of 2 slices shown]
[im 1/2]
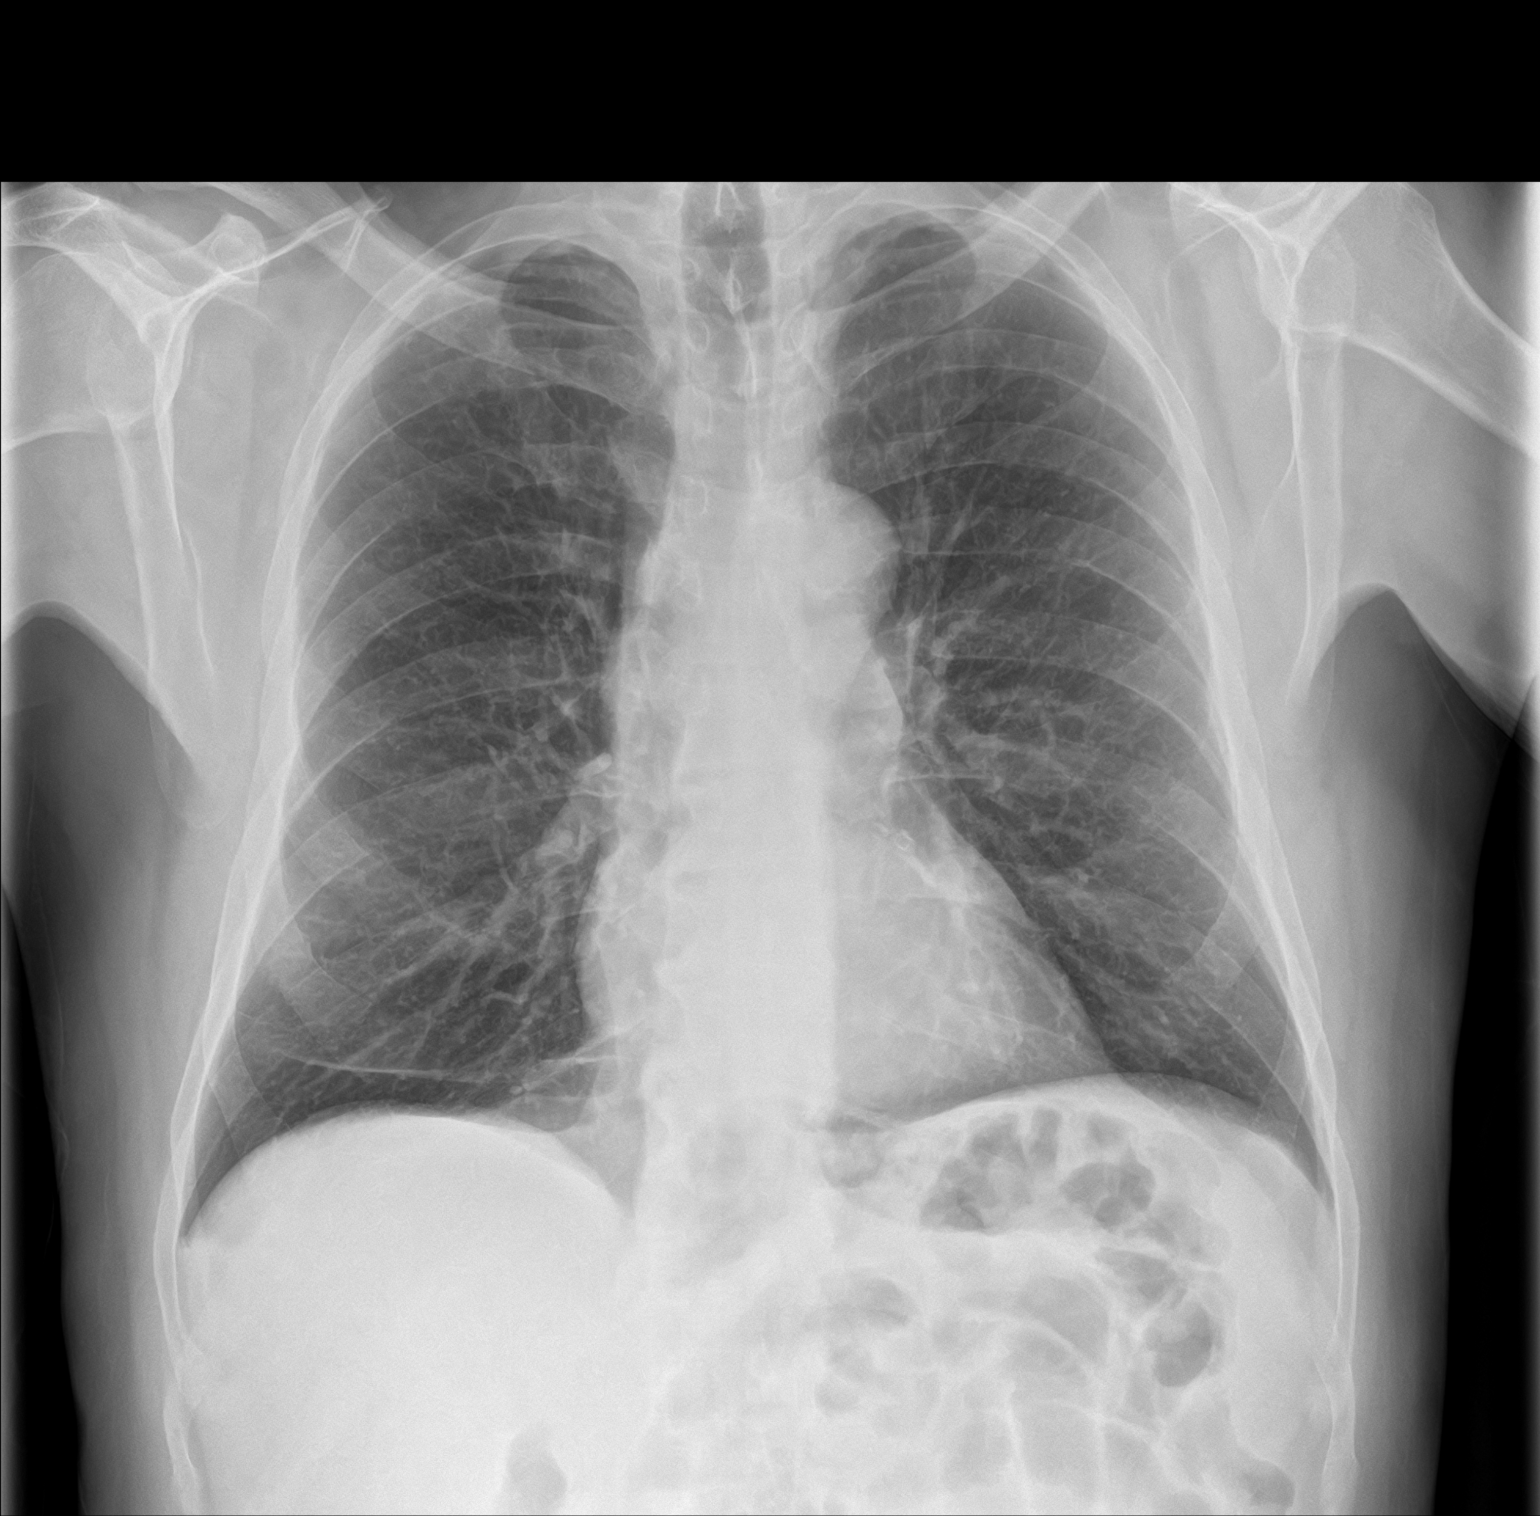
[im 2/2]
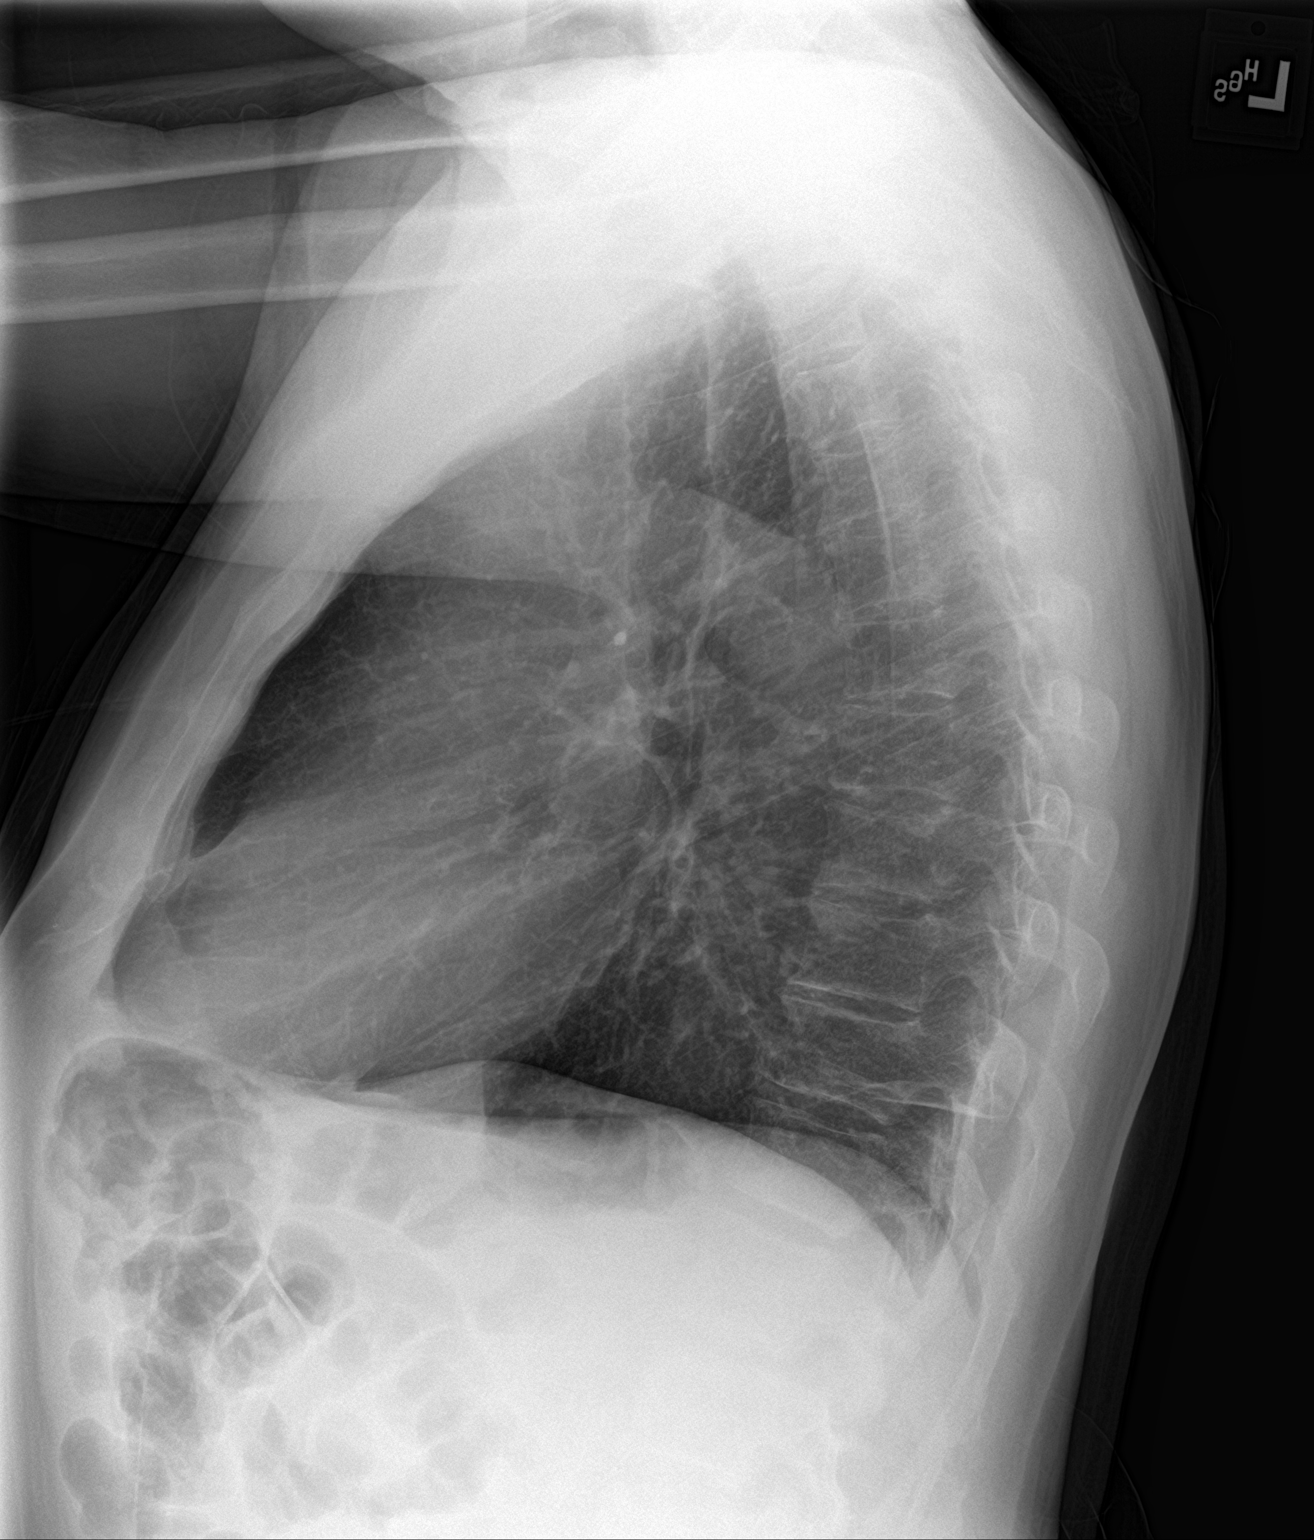

[2 of 2 positions shown; findings below may reference images not displayed]

FINDINGS: Cardiac shadow is stable. The lungs are hyper aerated bilaterally.
No focal infiltrate or sizable effusion is seen. Minimal scarring is
noted in the right base. No bony abnormality is noted.
IMPRESSION: COPD without acute abnormality.

## 2019-06-15 ENCOUNTER — Encounter: Payer: Self-pay | Admitting: Ophthalmology

## 2019-07-03 ENCOUNTER — Other Ambulatory Visit: Payer: Self-pay

## 2019-07-03 ENCOUNTER — Other Ambulatory Visit
Admission: RE | Admit: 2019-07-03 | Discharge: 2019-07-03 | Disposition: A | Discharge status: Home / Self Care | Payer: Medicare PPO | Source: Ambulatory Visit | Attending: Ophthalmology | Admitting: Ophthalmology

## 2019-07-03 DIAGNOSIS — Z20822 Contact with and (suspected) exposure to covid-19: Secondary | ICD-10-CM | POA: Insufficient documentation

## 2019-07-03 DIAGNOSIS — Z01812 Encounter for preprocedural laboratory examination: Secondary | ICD-10-CM | POA: Insufficient documentation

## 2019-07-04 LAB — SARS CORONAVIRUS 2 (TAT 6-24 HRS): SARS Coronavirus 2: NEGATIVE

## 2019-07-04 NOTE — Discharge Instructions (Addendum)
Eye Surgery Discharge Instructions  Expect mild scratchy sensation or mild soreness. DO NOT RUB YOUR EYE!  The day of surgery: . Minimal physical activity, but bed rest is not required . No reading, computer work, or close hand work . No bending, lifting, or straining. . May watch TV  For 24 hours: . No driving, legal decisions, or alcoholic beverages . Safety precautions . Eat anything you prefer: It is better to start with liquids, then soup then solid foods. . _____ Eye patch should be worn until postoperative exam tomorrow. . ____ Solar shield eyeglasses should be worn for comfort in the sunlight/patch while sleeping  Resume all regular medications including aspirin or Coumadin if these were discontinued prior to surgery. You may shower, bathe, shave, or wash your hair. Tylenol may be taken for mild discomfort. Follow Dr. Inda Coke eye drop instruction sheet as reviewed.  Call your doctor if you experience significant pain, nausea, or vomiting, fever > 101 or other signs of infection. [phone] or [phone] Specific instructions:  Follow-up Information    Birder Robson, MD Follow up.   Specialty: Ophthalmology Why: 07/06/19 @ 11:05 am Contact information: [place] [address] [phone]

## 2019-07-05 ENCOUNTER — Encounter
Admission: RE | Disposition: A | Discharge status: Home / Self Care | Payer: Self-pay | Source: Home / Self Care | Attending: Ophthalmology

## 2019-07-05 ENCOUNTER — Ambulatory Visit: Payer: Medicare PPO | Admitting: Anesthesiology

## 2019-07-05 ENCOUNTER — Other Ambulatory Visit: Payer: Self-pay

## 2019-07-05 ENCOUNTER — Ambulatory Visit
Admission: RE | Admit: 2019-07-05 | Discharge: 2019-07-05 | Disposition: A | Discharge status: Home / Self Care | Payer: Medicare PPO | Attending: Ophthalmology | Admitting: Ophthalmology

## 2019-07-05 ENCOUNTER — Encounter: Payer: Self-pay | Admitting: Ophthalmology

## 2019-07-05 DIAGNOSIS — I252 Old myocardial infarction: Secondary | ICD-10-CM | POA: Insufficient documentation

## 2019-07-05 DIAGNOSIS — I10 Essential (primary) hypertension: Secondary | ICD-10-CM | POA: Insufficient documentation

## 2019-07-05 DIAGNOSIS — Z79899 Other long term (current) drug therapy: Secondary | ICD-10-CM | POA: Insufficient documentation

## 2019-07-05 DIAGNOSIS — Z87891 Personal history of nicotine dependence: Secondary | ICD-10-CM | POA: Diagnosis not present

## 2019-07-05 DIAGNOSIS — Z7984 Long term (current) use of oral hypoglycemic drugs: Secondary | ICD-10-CM | POA: Diagnosis not present

## 2019-07-05 DIAGNOSIS — Z955 Presence of coronary angioplasty implant and graft: Secondary | ICD-10-CM | POA: Insufficient documentation

## 2019-07-05 DIAGNOSIS — E119 Type 2 diabetes mellitus without complications: Secondary | ICD-10-CM | POA: Insufficient documentation

## 2019-07-05 DIAGNOSIS — K219 Gastro-esophageal reflux disease without esophagitis: Secondary | ICD-10-CM | POA: Diagnosis not present

## 2019-07-05 DIAGNOSIS — H2511 Age-related nuclear cataract, right eye: Secondary | ICD-10-CM | POA: Insufficient documentation

## 2019-07-05 DIAGNOSIS — F329 Major depressive disorder, single episode, unspecified: Secondary | ICD-10-CM | POA: Insufficient documentation

## 2019-07-05 DIAGNOSIS — I2510 Atherosclerotic heart disease of native coronary artery without angina pectoris: Secondary | ICD-10-CM | POA: Insufficient documentation

## 2019-07-05 DIAGNOSIS — M109 Gout, unspecified: Secondary | ICD-10-CM | POA: Diagnosis not present

## 2019-07-05 HISTORY — DX: Anemia, unspecified: D64.9

## 2019-07-05 HISTORY — DX: Diverticulosis of intestine, part unspecified, without perforation or abscess without bleeding: K57.90

## 2019-07-05 HISTORY — PX: CATARACT EXTRACTION W/PHACO: SHX586

## 2019-07-05 HISTORY — DX: Chronic kidney disease, unspecified: N18.9

## 2019-07-05 HISTORY — DX: Angina pectoris, unspecified: I20.9

## 2019-07-05 HISTORY — DX: Atherosclerotic heart disease of native coronary artery without angina pectoris: I25.10

## 2019-07-05 HISTORY — DX: Gastro-esophageal reflux disease without esophagitis: K21.9

## 2019-07-05 HISTORY — DX: Cardiac murmur, unspecified: R01.1

## 2019-07-05 HISTORY — DX: Unspecified atrial fibrillation: I48.91

## 2019-07-05 LAB — GLUCOSE, CAPILLARY
Glucose-Capillary: 83 mg/dL (ref 70–99)
Glucose-Capillary: 96 mg/dL (ref 70–99)

## 2019-07-05 SURGERY — PHACOEMULSIFICATION, CATARACT, WITH IOL INSERTION
Anesthesia: Monitor Anesthesia Care | Site: Eye | Laterality: Right

## 2019-07-05 MED ORDER — ONDANSETRON HCL 4 MG/2ML IJ SOLN
INTRAMUSCULAR | Status: AC
  Filled 2019-07-05: qty 16

## 2019-07-05 MED ORDER — MIDAZOLAM HCL 2 MG/2ML IJ SOLN
INTRAMUSCULAR | Status: AC
  Filled 2019-07-05: qty 2

## 2019-07-05 MED ORDER — CHLORHEXIDINE GLUCONATE 0.12 % MT SOLN: 15.0000 mL | Freq: Once | OROMUCOSAL | Status: DC

## 2019-07-05 MED ORDER — ARMC OPHTHALMIC DILATING DROPS
OPHTHALMIC | Status: AC
  Filled 2019-07-05: qty 0.5

## 2019-07-05 MED ORDER — LIDOCAINE HCL (PF) 2 % IJ SOLN
INTRAMUSCULAR | Status: AC
  Filled 2019-07-05: qty 5

## 2019-07-05 MED ORDER — POVIDONE-IODINE 5 % OP SOLN
OPHTHALMIC | Status: DC | PRN
  Administered 2019-07-05: 1 via OPHTHALMIC

## 2019-07-05 MED ORDER — ONDANSETRON HCL 4 MG/2ML IJ SOLN
INTRAMUSCULAR | Status: DC | PRN
  Administered 2019-07-05: 4 mg via INTRAVENOUS

## 2019-07-05 MED ORDER — ARMC OPHTHALMIC DILATING DROPS
1.0000 "application " | OPHTHALMIC | Status: AC
  Administered 2019-07-05 (×3): 1 via OPHTHALMIC

## 2019-07-05 MED ORDER — MIDAZOLAM HCL 2 MG/2ML IJ SOLN
INTRAMUSCULAR | Status: DC | PRN
  Administered 2019-07-05 (×2): 1 mg via INTRAVENOUS

## 2019-07-05 MED ORDER — ORAL CARE MOUTH RINSE: 15.0000 mL | Freq: Once | OROMUCOSAL | Status: DC

## 2019-07-05 MED ORDER — SODIUM CHLORIDE 0.9 % IV SOLN: INTRAVENOUS | Status: DC

## 2019-07-05 MED ORDER — NA CHONDROIT SULF-NA HYALURON 40-17 MG/ML IO SOLN
INTRAOCULAR | Status: DC | PRN
  Administered 2019-07-05: 1 mL via INTRAOCULAR

## 2019-07-05 MED ORDER — EPINEPHRINE PF 1 MG/ML IJ SOLN
INTRAOCULAR | Status: DC | PRN
  Administered 2019-07-05: 200 mL via OPHTHALMIC

## 2019-07-05 MED ORDER — TETRACAINE HCL 0.5 % OP SOLN
OPHTHALMIC | Status: AC
  Administered 2019-07-05: 1 [drp] via OPHTHALMIC
  Filled 2019-07-05: qty 4

## 2019-07-05 MED ORDER — MOXIFLOXACIN HCL 0.5 % OP SOLN: 1.0000 [drp] | Freq: Once | OPHTHALMIC | Status: DC

## 2019-07-05 MED ORDER — FENTANYL CITRATE (PF) 100 MCG/2ML IJ SOLN
INTRAMUSCULAR | Status: AC
  Filled 2019-07-05: qty 2

## 2019-07-05 MED ORDER — MOXIFLOXACIN HCL 0.5 % OP SOLN
OPHTHALMIC | Status: DC | PRN
  Administered 2019-07-05: 0.2 mL via OPHTHALMIC

## 2019-07-05 MED ORDER — FENTANYL CITRATE (PF) 100 MCG/2ML IJ SOLN
INTRAMUSCULAR | Status: DC | PRN
  Administered 2019-07-05: 50 ug via INTRAVENOUS

## 2019-07-05 MED ORDER — EPHEDRINE 5 MG/ML INJ
INTRAVENOUS | Status: AC
  Filled 2019-07-05: qty 10

## 2019-07-05 MED ORDER — LIDOCAINE HCL (PF) 4 % IJ SOLN
INTRAOCULAR | Status: DC | PRN
  Administered 2019-07-05: 2 mL via OPHTHALMIC

## 2019-07-05 MED ORDER — TETRACAINE HCL 0.5 % OP SOLN: 1.0000 [drp] | Freq: Once | OPHTHALMIC | Status: AC

## 2019-07-05 MED ORDER — MOXIFLOXACIN HCL 0.5 % OP SOLN
OPHTHALMIC | Status: AC
  Filled 2019-07-05: qty 3

## 2019-07-05 MED ORDER — CARBACHOL 0.01 % IO SOLN
INTRAOCULAR | Status: DC | PRN
  Administered 2019-07-05: 0.5 mL via INTRAOCULAR

## 2019-07-05 SURGICAL SUPPLY — 16 items
GLOVE BIO SURGEON STRL SZ8 (GLOVE) ×3 IMPLANT
GLOVE BIOGEL M 6.5 STRL (GLOVE) ×3 IMPLANT
GLOVE SURG LX 8.0 MICRO (GLOVE) ×2
GLOVE SURG LX STRL 8.0 MICRO (GLOVE) ×1 IMPLANT
GOWN STRL REUS W/ TWL LRG LVL3 (GOWN DISPOSABLE) ×2 IMPLANT
GOWN STRL REUS W/TWL LRG LVL3 (GOWN DISPOSABLE) ×4
LABEL CATARACT MEDS ST (LABEL) ×3 IMPLANT
LENS IOL DIOP 25.0 (Intraocular Lens) ×3 IMPLANT
LENS IOL TECNIS MONO 25.0 (Intraocular Lens) IMPLANT
PACK CATARACT BRASINGTON LX (MISCELLANEOUS) ×3 IMPLANT
PACK EYE AFTER SURG (MISCELLANEOUS) ×3 IMPLANT
SOL BSS BAG (MISCELLANEOUS) ×3
SOLUTION BSS BAG (MISCELLANEOUS) ×1 IMPLANT
SYR 5ML LL (SYRINGE) ×3 IMPLANT
WATER STERILE IRR 250ML POUR (IV SOLUTION) ×3 IMPLANT
WIPE NON LINTING 3.25X3.25 (MISCELLANEOUS) ×3 IMPLANT

## 2019-07-05 NOTE — H&P (Signed)
All labs reviewed. Abnormal studies sent to patients PCP when indicated.  Previous H&P reviewed, patient examined, there are NO CHANGES.  Casey French Porfilio6/10/20218:59 AM

## 2019-07-05 NOTE — Anesthesia Procedure Notes (Signed)
Procedure Name: MAC Date/Time: 07/05/2019 9:05 AM Performed by: Kelton Pillar, CRNA Pre-anesthesia Checklist: Patient identified, Emergency Drugs available, Suction available, Patient being monitored and Timeout performed Patient Re-evaluated:Patient Re-evaluated prior to induction Oxygen Delivery Method: Nasal cannula Preoxygenation: Pre-oxygenation with 100% oxygen Induction Type: IV induction

## 2019-07-05 NOTE — Op Note (Signed)
PREOPERATIVE DIAGNOSIS:  Nuclear sclerotic cataract of the right eye.   POSTOPERATIVE DIAGNOSIS:  H25.11 Cataract   OPERATIVE PROCEDURE: Procedure(s): CATARACT EXTRACTION PHACO AND INTRAOCULAR LENS PLACEMENT (IOC) RIGHT DIABETIC   SURGEON:  Birder Robson, MD.   ANESTHESIA:  Anesthesiologist: Piscitello, Precious Haws, MD CRNA: Kelton Pillar, CRNA; Johnna Acosta, CRNA  1.      Managed anesthesia care. 2.      0.21ml of Shugarcaine was instilled in the eye following the paracentesis.   COMPLICATIONS:  None.   TECHNIQUE:   Stop and chop   DESCRIPTION OF PROCEDURE:  The patient was examined and consented in the preoperative holding area where the aforementioned topical anesthesia was applied to the right eye and then brought back to the Operating Room where the right eye was prepped and draped in the usual sterile ophthalmic fashion and a lid speculum was placed. A paracentesis was created with the side port blade and the anterior chamber was filled with viscoelastic. A near clear corneal incision was performed with the steel keratome. A continuous curvilinear capsulorrhexis was performed with a cystotome followed by the capsulorrhexis forceps. Hydrodissection and hydrodelineation were carried out with BSS on a blunt cannula. The lens was removed in a stop and chop  technique and the remaining cortical material was removed with the irrigation-aspiration handpiece. The capsular bag was inflated with viscoelastic and the Technis [id number]  lens was placed in the capsular bag without complication. The remaining viscoelastic was removed from the eye with the irrigation-aspiration handpiece. The wounds were hydrated. The anterior chamber was flushed with Miostat and the eye was inflated to physiologic pressure. 0.66ml of Vigamox was placed in the anterior chamber. The wounds were found to be water tight. The eye was dressed with Vigamox. The patient was given protective glasses to wear  throughout the day and a shield with which to sleep tonight. The patient was also given drops with which to begin a drop regimen today and will follow-up with me in one day. Implant Name Type Inv. Item Serial No. Manufacturer Lot No. LRB No. Used Action  LENS IOL DIOP 25.0 - [id number] Intraocular Lens LENS IOL DIOP 25.0 [id number] AMO  Right 1 Implanted   Procedure(s) with comments: CATARACT EXTRACTION PHACO AND INTRAOCULAR LENS PLACEMENT (IOC) RIGHT DIABETIC (Right) - US00:59.3 CDE8.41 LOT [id number]  Electronically signed: Birder Robson 07/05/2019 9:27 AM

## 2019-07-05 NOTE — Anesthesia Preprocedure Evaluation (Signed)
Anesthesia Evaluation  Patient identified by MRN, date of birth, ID band Patient awake    Reviewed: Allergy & Precautions, H&P , NPO status , Patient's Chart, lab work & pertinent test results, reviewed documented beta blocker date and time   History of Anesthesia Complications Negative for: history of anesthetic complications  Airway Mallampati: II  TM Distance: <3 FB Neck ROM: limited    Dental  (+) Upper Dentures, Lower Dentures   Pulmonary neg pulmonary ROS, former smoker,    Pulmonary exam normal        Cardiovascular Exercise Tolerance: Good hypertension, (-) angina+ CAD, + Past MI and + Cardiac Stents  (-) CABG Normal cardiovascular exam(-) dysrhythmias + Valvular Problems/Murmurs      Neuro/Psych PSYCHIATRIC DISORDERS Depression negative neurological ROS  negative psych ROS   GI/Hepatic Neg liver ROS, GERD  Medicated and Controlled,  Endo/Other  diabetes, Type 2, Oral Hypoglycemic Agents  Renal/GU Renal disease  negative genitourinary   Musculoskeletal   Abdominal   Peds  Hematology negative hematology ROS (+)   Anesthesia Other Findings Past Medical History: No date: Depression No date: Diabetes mellitus without complication (HCC) No date: Gout No date: High cholesterol No date: Hypertension No date: Myocardial infarction ([place])   Reproductive/Obstetrics negative OB ROS                             Anesthesia Physical  Anesthesia Plan  ASA: III  Anesthesia Plan: MAC   Post-op Pain Management:    Induction: Intravenous  PONV Risk Score and Plan: 2  Airway Management Planned: Nasal Cannula  Additional Equipment:   Intra-op Plan:   Post-operative Plan:   Informed Consent: I have reviewed the patients History and Physical, chart, labs and discussed the procedure including the risks, benefits and alternatives for the proposed anesthesia with the patient or  authorized representative who has indicated his/her understanding and acceptance.     Dental Advisory Given  Plan Discussed with: Anesthesiologist, CRNA and Surgeon  Anesthesia Plan Comments: (Patient consented for risks of anesthesia including but not limited to:  - adverse reactions to medications - damage to eyes, teeth, lips or other oral mucosa - nerve damage due to positioning  - sore throat or hoarseness - Damage to heart, brain, nerves, lungs, other parts of body or loss of life  Patient voiced understanding.)        Anesthesia Quick Evaluation

## 2019-07-05 NOTE — Anesthesia Postprocedure Evaluation (Signed)
Anesthesia Post Note  Patient: Casey French  Procedure(s) Performed: CATARACT EXTRACTION PHACO AND INTRAOCULAR LENS PLACEMENT (IOC) RIGHT DIABETIC (Right Eye)  Patient location during evaluation: Phase II Anesthesia Type: MAC Level of consciousness: awake and alert, oriented and patient cooperative Pain management: pain level controlled Vital Signs Assessment: post-procedure vital signs reviewed and stable Respiratory status: spontaneous breathing Cardiovascular status: stable Postop Assessment: no apparent nausea or vomiting Anesthetic complications: no   No complications documented.   Last Vitals:  Vitals:   07/05/19 0928 07/05/19 0937  BP: (!) 166/80 (!) 160/91  Pulse: 84 81  Resp: 16 16  Temp: 36.5 C   SpO2: 97% 97%    Last Pain:  Vitals:   07/05/19 0937  TempSrc:   PainSc: 0-No pain                 Armoni Kludt [place]

## 2019-07-05 NOTE — Transfer of Care (Signed)
Immediate Anesthesia Transfer of Care Note  Patient: Casey French  Procedure(s) Performed: CATARACT EXTRACTION PHACO AND INTRAOCULAR LENS PLACEMENT (IOC) RIGHT DIABETIC (Right Eye)  Patient Location: PACU  Anesthesia Type:General  Level of Consciousness: awake, alert , oriented and patient cooperative  Airway & Oxygen Therapy: Patient Spontanous Breathing  Post-op Assessment: Report given to RN and Post -op Vital signs reviewed and stable  Post vital signs: Reviewed and stable  Last Vitals:  Vitals Value Taken Time  BP    Temp    Pulse    Resp    SpO2      Last Pain:  Vitals:   07/05/19 0826  TempSrc: Tympanic  PainSc: 0-No pain         Complications: No complications documented.

## 2019-07-06 ENCOUNTER — Encounter: Payer: Self-pay | Admitting: Ophthalmology

## 2019-11-15 DIAGNOSIS — I129 Hypertensive chronic kidney disease with stage 1 through stage 4 chronic kidney disease, or unspecified chronic kidney disease: Secondary | ICD-10-CM | POA: Insufficient documentation

## 2019-11-15 DIAGNOSIS — D631 Anemia in chronic kidney disease: Secondary | ICD-10-CM | POA: Insufficient documentation

## 2019-11-15 DIAGNOSIS — N189 Chronic kidney disease, unspecified: Secondary | ICD-10-CM | POA: Insufficient documentation

## 2019-11-15 DIAGNOSIS — N1832 Chronic kidney disease, stage 3b: Secondary | ICD-10-CM | POA: Insufficient documentation

## 2019-11-15 DIAGNOSIS — R809 Proteinuria, unspecified: Secondary | ICD-10-CM | POA: Insufficient documentation

## 2019-11-16 ENCOUNTER — Other Ambulatory Visit: Payer: Self-pay | Admitting: Nephrology

## 2019-11-16 DIAGNOSIS — R809 Proteinuria, unspecified: Secondary | ICD-10-CM

## 2019-11-16 DIAGNOSIS — Z862 Personal history of diseases of the blood and blood-forming organs and certain disorders involving the immune mechanism: Secondary | ICD-10-CM

## 2019-11-16 DIAGNOSIS — I129 Hypertensive chronic kidney disease with stage 1 through stage 4 chronic kidney disease, or unspecified chronic kidney disease: Secondary | ICD-10-CM

## 2019-11-16 DIAGNOSIS — N1832 Chronic kidney disease, stage 3b: Secondary | ICD-10-CM

## 2019-11-16 DIAGNOSIS — E1122 Type 2 diabetes mellitus with diabetic chronic kidney disease: Secondary | ICD-10-CM

## 2020-01-09 DIAGNOSIS — Z01810 Encounter for preprocedural cardiovascular examination: Secondary | ICD-10-CM | POA: Insufficient documentation

## 2020-02-02 ENCOUNTER — Emergency Department: Payer: Medicare PPO

## 2020-02-02 ENCOUNTER — Other Ambulatory Visit: Payer: Self-pay

## 2020-02-02 DIAGNOSIS — D696 Thrombocytopenia, unspecified: Secondary | ICD-10-CM | POA: Diagnosis present

## 2020-02-02 DIAGNOSIS — Z79899 Other long term (current) drug therapy: Secondary | ICD-10-CM

## 2020-02-02 DIAGNOSIS — M109 Gout, unspecified: Secondary | ICD-10-CM | POA: Diagnosis present

## 2020-02-02 DIAGNOSIS — E1122 Type 2 diabetes mellitus with diabetic chronic kidney disease: Secondary | ICD-10-CM | POA: Diagnosis present

## 2020-02-02 DIAGNOSIS — N179 Acute kidney failure, unspecified: Principal | ICD-10-CM | POA: Diagnosis present

## 2020-02-02 DIAGNOSIS — I161 Hypertensive emergency: Secondary | ICD-10-CM | POA: Diagnosis not present

## 2020-02-02 DIAGNOSIS — G8929 Other chronic pain: Secondary | ICD-10-CM | POA: Diagnosis present

## 2020-02-02 DIAGNOSIS — F32A Depression, unspecified: Secondary | ICD-10-CM | POA: Diagnosis present

## 2020-02-02 DIAGNOSIS — D631 Anemia in chronic kidney disease: Secondary | ICD-10-CM | POA: Diagnosis present

## 2020-02-02 DIAGNOSIS — E7800 Pure hypercholesterolemia, unspecified: Secondary | ICD-10-CM | POA: Diagnosis present

## 2020-02-02 DIAGNOSIS — Z23 Encounter for immunization: Secondary | ICD-10-CM

## 2020-02-02 DIAGNOSIS — Z7901 Long term (current) use of anticoagulants: Secondary | ICD-10-CM

## 2020-02-02 DIAGNOSIS — Z955 Presence of coronary angioplasty implant and graft: Secondary | ICD-10-CM

## 2020-02-02 DIAGNOSIS — I482 Chronic atrial fibrillation, unspecified: Secondary | ICD-10-CM | POA: Diagnosis present

## 2020-02-02 DIAGNOSIS — N184 Chronic kidney disease, stage 4 (severe): Secondary | ICD-10-CM | POA: Diagnosis present

## 2020-02-02 DIAGNOSIS — Z87891 Personal history of nicotine dependence: Secondary | ICD-10-CM

## 2020-02-02 DIAGNOSIS — I248 Other forms of acute ischemic heart disease: Secondary | ICD-10-CM | POA: Diagnosis present

## 2020-02-02 DIAGNOSIS — I2510 Atherosclerotic heart disease of native coronary artery without angina pectoris: Secondary | ICD-10-CM | POA: Diagnosis present

## 2020-02-02 DIAGNOSIS — Z8249 Family history of ischemic heart disease and other diseases of the circulatory system: Secondary | ICD-10-CM

## 2020-02-02 DIAGNOSIS — Z7982 Long term (current) use of aspirin: Secondary | ICD-10-CM

## 2020-02-02 DIAGNOSIS — E872 Acidosis: Secondary | ICD-10-CM | POA: Diagnosis present

## 2020-02-02 DIAGNOSIS — E876 Hypokalemia: Secondary | ICD-10-CM | POA: Diagnosis present

## 2020-02-02 DIAGNOSIS — I252 Old myocardial infarction: Secondary | ICD-10-CM

## 2020-02-02 DIAGNOSIS — Z885 Allergy status to narcotic agent status: Secondary | ICD-10-CM

## 2020-02-02 DIAGNOSIS — I129 Hypertensive chronic kidney disease with stage 1 through stage 4 chronic kidney disease, or unspecified chronic kidney disease: Secondary | ICD-10-CM | POA: Diagnosis present

## 2020-02-02 DIAGNOSIS — Z20822 Contact with and (suspected) exposure to covid-19: Secondary | ICD-10-CM | POA: Diagnosis present

## 2020-02-02 DIAGNOSIS — K219 Gastro-esophageal reflux disease without esophagitis: Secondary | ICD-10-CM | POA: Diagnosis present

## 2020-02-02 DIAGNOSIS — Z7984 Long term (current) use of oral hypoglycemic drugs: Secondary | ICD-10-CM

## 2020-02-02 LAB — COMPREHENSIVE METABOLIC PANEL
ALT: 13 U/L (ref 0–44)
AST: 23 U/L (ref 15–41)
Albumin: 2.1 g/dL — ABNORMAL LOW (ref 3.5–5.0)
Alkaline Phosphatase: 99 U/L (ref 38–126)
Anion gap: 13 (ref 5–15)
BUN: 25 mg/dL — ABNORMAL HIGH (ref 8–23)
CO2: 16 mmol/L — ABNORMAL LOW (ref 22–32)
Calcium: 7.6 mg/dL — ABNORMAL LOW (ref 8.9–10.3)
Chloride: 112 mmol/L — ABNORMAL HIGH (ref 98–111)
Creatinine, Ser: 2.66 mg/dL — ABNORMAL HIGH (ref 0.61–1.24)
GFR, Estimated: 25 mL/min — ABNORMAL LOW (ref >60)
Glucose, Bld: 128 mg/dL — ABNORMAL HIGH (ref 70–99)
Potassium: 3.6 mmol/L (ref 3.5–5.1)
Sodium: 141 mmol/L (ref 135–145)
Total Bilirubin: 1.1 mg/dL (ref 0.3–1.2)
Total Protein: 5.9 g/dL — ABNORMAL LOW (ref 6.5–8.1)

## 2020-02-02 LAB — CBC
HCT: 28.7 % — ABNORMAL LOW (ref 39.0–52.0)
Hemoglobin: 9.4 g/dL — ABNORMAL LOW (ref 13.0–17.0)
MCH: 31.2 pg (ref 26.0–34.0)
MCHC: 32.8 g/dL (ref 30.0–36.0)
MCV: 95.3 fL (ref 80.0–100.0)
Platelets: 233 10*3/uL (ref 150–400)
RBC: 3.01 MIL/uL — ABNORMAL LOW (ref 4.22–5.81)
RDW: 13.9 % (ref 11.5–15.5)
WBC: 13.5 10*3/uL — ABNORMAL HIGH (ref 4.0–10.5)
nRBC: 0 % (ref 0.0–0.2)

## 2020-02-02 LAB — TROPONIN I (HIGH SENSITIVITY): Troponin I (High Sensitivity): 17 ng/L (ref <18)

## 2020-02-02 NOTE — ED Triage Notes (Signed)
FIRST NURSE NOTE: Pt here with reports of chest pain and elevated BP.  EMS applied 2inch NTG paste, gave 324 ASA, 4mg  Zofran, pt was vomiting and pale,   BP 221/104. Has hx of HTN  CBG 146

## 2020-02-02 NOTE — ED Triage Notes (Signed)
Pt states has been having central chest pain and vomiting since 1600 today. Pt states has felt shob.

## 2020-02-03 ENCOUNTER — Inpatient Hospital Stay: Payer: Medicare PPO

## 2020-02-03 ENCOUNTER — Emergency Department: Payer: Medicare PPO

## 2020-02-03 ENCOUNTER — Inpatient Hospital Stay
Admission: EM | Admit: 2020-02-03 | Discharge: 2020-02-08 | DRG: 683 | Disposition: A | Discharge status: Home / Self Care | Payer: Medicare PPO | Attending: Internal Medicine | Admitting: Internal Medicine

## 2020-02-03 DIAGNOSIS — I160 Hypertensive urgency: Secondary | ICD-10-CM | POA: Diagnosis not present

## 2020-02-03 DIAGNOSIS — Z87891 Personal history of nicotine dependence: Secondary | ICD-10-CM | POA: Diagnosis not present

## 2020-02-03 DIAGNOSIS — N179 Acute kidney failure, unspecified: Secondary | ICD-10-CM | POA: Diagnosis not present

## 2020-02-03 DIAGNOSIS — R809 Proteinuria, unspecified: Secondary | ICD-10-CM

## 2020-02-03 DIAGNOSIS — R112 Nausea with vomiting, unspecified: Secondary | ICD-10-CM

## 2020-02-03 DIAGNOSIS — I159 Secondary hypertension, unspecified: Secondary | ICD-10-CM

## 2020-02-03 DIAGNOSIS — I482 Chronic atrial fibrillation, unspecified: Secondary | ICD-10-CM | POA: Diagnosis not present

## 2020-02-03 DIAGNOSIS — E1122 Type 2 diabetes mellitus with diabetic chronic kidney disease: Secondary | ICD-10-CM | POA: Diagnosis present

## 2020-02-03 DIAGNOSIS — E876 Hypokalemia: Secondary | ICD-10-CM | POA: Diagnosis present

## 2020-02-03 DIAGNOSIS — E7800 Pure hypercholesterolemia, unspecified: Secondary | ICD-10-CM | POA: Diagnosis present

## 2020-02-03 DIAGNOSIS — I10 Essential (primary) hypertension: Secondary | ICD-10-CM | POA: Diagnosis not present

## 2020-02-03 DIAGNOSIS — F32A Depression, unspecified: Secondary | ICD-10-CM | POA: Diagnosis present

## 2020-02-03 DIAGNOSIS — D696 Thrombocytopenia, unspecified: Secondary | ICD-10-CM | POA: Diagnosis present

## 2020-02-03 DIAGNOSIS — Z7982 Long term (current) use of aspirin: Secondary | ICD-10-CM | POA: Diagnosis not present

## 2020-02-03 DIAGNOSIS — Z7901 Long term (current) use of anticoagulants: Secondary | ICD-10-CM | POA: Diagnosis not present

## 2020-02-03 DIAGNOSIS — I248 Other forms of acute ischemic heart disease: Secondary | ICD-10-CM | POA: Diagnosis present

## 2020-02-03 DIAGNOSIS — Z955 Presence of coronary angioplasty implant and graft: Secondary | ICD-10-CM | POA: Diagnosis not present

## 2020-02-03 DIAGNOSIS — I129 Hypertensive chronic kidney disease with stage 1 through stage 4 chronic kidney disease, or unspecified chronic kidney disease: Secondary | ICD-10-CM | POA: Diagnosis present

## 2020-02-03 DIAGNOSIS — R1030 Lower abdominal pain, unspecified: Secondary | ICD-10-CM

## 2020-02-03 DIAGNOSIS — I2510 Atherosclerotic heart disease of native coronary artery without angina pectoris: Secondary | ICD-10-CM | POA: Diagnosis present

## 2020-02-03 DIAGNOSIS — K219 Gastro-esophageal reflux disease without esophagitis: Secondary | ICD-10-CM | POA: Diagnosis present

## 2020-02-03 DIAGNOSIS — N184 Chronic kidney disease, stage 4 (severe): Secondary | ICD-10-CM | POA: Diagnosis present

## 2020-02-03 DIAGNOSIS — I252 Old myocardial infarction: Secondary | ICD-10-CM | POA: Diagnosis not present

## 2020-02-03 DIAGNOSIS — M109 Gout, unspecified: Secondary | ICD-10-CM | POA: Diagnosis present

## 2020-02-03 DIAGNOSIS — Z885 Allergy status to narcotic agent status: Secondary | ICD-10-CM | POA: Diagnosis not present

## 2020-02-03 DIAGNOSIS — Z23 Encounter for immunization: Secondary | ICD-10-CM | POA: Diagnosis present

## 2020-02-03 DIAGNOSIS — R197 Diarrhea, unspecified: Secondary | ICD-10-CM

## 2020-02-03 DIAGNOSIS — Z79899 Other long term (current) drug therapy: Secondary | ICD-10-CM | POA: Diagnosis not present

## 2020-02-03 DIAGNOSIS — Z20822 Contact with and (suspected) exposure to covid-19: Secondary | ICD-10-CM | POA: Diagnosis present

## 2020-02-03 DIAGNOSIS — N189 Chronic kidney disease, unspecified: Secondary | ICD-10-CM | POA: Diagnosis present

## 2020-02-03 DIAGNOSIS — R079 Chest pain, unspecified: Secondary | ICD-10-CM

## 2020-02-03 DIAGNOSIS — E119 Type 2 diabetes mellitus without complications: Secondary | ICD-10-CM

## 2020-02-03 DIAGNOSIS — E872 Acidosis: Secondary | ICD-10-CM | POA: Diagnosis present

## 2020-02-03 DIAGNOSIS — I161 Hypertensive emergency: Secondary | ICD-10-CM | POA: Diagnosis present

## 2020-02-03 LAB — RESP PANEL BY RT-PCR (FLU A&B, COVID) ARPGX2
Influenza A by PCR: NEGATIVE
Influenza B by PCR: NEGATIVE
SARS Coronavirus 2 by RT PCR: NEGATIVE

## 2020-02-03 LAB — TROPONIN I (HIGH SENSITIVITY)
Troponin I (High Sensitivity): 39 ng/L — ABNORMAL HIGH (ref <18)
Troponin I (High Sensitivity): 67 ng/L — ABNORMAL HIGH (ref <18)
Troponin I (High Sensitivity): 81 ng/L — ABNORMAL HIGH (ref <18)

## 2020-02-03 LAB — LIPASE, BLOOD: Lipase: 25 U/L (ref 11–51)

## 2020-02-03 MED ORDER — LABETALOL HCL 5 MG/ML IV SOLN
20.0000 mg | INTRAVENOUS | Status: DC | PRN
  Administered 2020-02-03: 20 mg via INTRAVENOUS
  Filled 2020-02-03: qty 4

## 2020-02-03 MED ORDER — SODIUM CHLORIDE 0.9 % IV BOLUS
1000.0000 mL | Freq: Once | INTRAVENOUS | Status: AC
  Administered 2020-02-03: 1000 mL via INTRAVENOUS

## 2020-02-03 MED ORDER — ISOSORBIDE MONONITRATE ER 30 MG PO TB24
60.0000 mg | ORAL_TABLET | Freq: Every day | ORAL | Status: DC
  Administered 2020-02-03 – 2020-02-08 (×5): 60 mg via ORAL
  Filled 2020-02-03 (×2): qty 2
  Filled 2020-02-03 (×2): qty 1
  Filled 2020-02-03: qty 2

## 2020-02-03 MED ORDER — ONDANSETRON HCL 4 MG/2ML IJ SOLN
4.0000 mg | Freq: Four times a day (QID) | INTRAMUSCULAR | Status: DC | PRN
  Administered 2020-02-04: 4 mg via INTRAVENOUS
  Filled 2020-02-03 (×2): qty 2

## 2020-02-03 MED ORDER — LABETALOL HCL 5 MG/ML IV SOLN
10.0000 mg | Freq: Once | INTRAVENOUS | Status: AC
  Administered 2020-02-03: 10 mg via INTRAVENOUS

## 2020-02-03 MED ORDER — PANTOPRAZOLE SODIUM 40 MG PO TBEC
40.0000 mg | DELAYED_RELEASE_TABLET | Freq: Two times a day (BID) | ORAL | Status: DC
  Administered 2020-02-03 – 2020-02-08 (×10): 40 mg via ORAL
  Filled 2020-02-03 (×10): qty 1

## 2020-02-03 MED ORDER — OXYCODONE HCL 5 MG PO TABS
5.0000 mg | ORAL_TABLET | Freq: Four times a day (QID) | ORAL | Status: DC | PRN
  Administered 2020-02-03 – 2020-02-07 (×3): 5 mg via ORAL
  Filled 2020-02-03 (×4): qty 1

## 2020-02-03 MED ORDER — ROSUVASTATIN CALCIUM 10 MG PO TABS
40.0000 mg | ORAL_TABLET | Freq: Every day | ORAL | Status: DC
  Administered 2020-02-03 – 2020-02-05 (×3): 40 mg via ORAL
  Filled 2020-02-03 (×3): qty 2
  Filled 2020-02-03 (×3): qty 4

## 2020-02-03 MED ORDER — SODIUM CHLORIDE 0.9 % IV SOLN: 250.0000 mL | INTRAVENOUS | Status: DC | PRN

## 2020-02-03 MED ORDER — SODIUM CHLORIDE 0.9% FLUSH: 3.0000 mL | INTRAVENOUS | Status: DC | PRN

## 2020-02-03 MED ORDER — ONDANSETRON HCL 4 MG/2ML IJ SOLN
4.0000 mg | Freq: Once | INTRAMUSCULAR | Status: AC
  Administered 2020-02-03: 4 mg via INTRAVENOUS
  Filled 2020-02-03: qty 2

## 2020-02-03 MED ORDER — APIXABAN 5 MG PO TABS: 5.0000 mg | ORAL_TABLET | Freq: Two times a day (BID) | ORAL | Status: DC

## 2020-02-03 MED ORDER — FENTANYL CITRATE (PF) 100 MCG/2ML IJ SOLN
50.0000 ug | Freq: Once | INTRAMUSCULAR | Status: AC
  Administered 2020-02-03: 50 ug via INTRAVENOUS
  Filled 2020-02-03: qty 2

## 2020-02-03 MED ORDER — METOPROLOL SUCCINATE ER 50 MG PO TB24
100.0000 mg | ORAL_TABLET | Freq: Every day | ORAL | Status: DC
  Administered 2020-02-03: 100 mg via ORAL
  Filled 2020-02-03: qty 2

## 2020-02-03 MED ORDER — GABAPENTIN 300 MG PO CAPS
300.0000 mg | ORAL_CAPSULE | Freq: Every day | ORAL | Status: DC
  Administered 2020-02-03 – 2020-02-07 (×5): 300 mg via ORAL
  Filled 2020-02-03 (×5): qty 1

## 2020-02-03 MED ORDER — APIXABAN 5 MG PO TABS
5.0000 mg | ORAL_TABLET | Freq: Two times a day (BID) | ORAL | Status: DC
  Administered 2020-02-03 – 2020-02-05 (×5): 5 mg via ORAL
  Filled 2020-02-03 (×5): qty 1

## 2020-02-03 MED ORDER — SODIUM CHLORIDE 0.9% FLUSH
3.0000 mL | Freq: Two times a day (BID) | INTRAVENOUS | Status: DC
  Administered 2020-02-03 – 2020-02-08 (×9): 3 mL via INTRAVENOUS

## 2020-02-03 MED ORDER — LABETALOL HCL 5 MG/ML IV SOLN
10.0000 mg | Freq: Once | INTRAVENOUS | Status: AC
  Administered 2020-02-03: 10 mg via INTRAVENOUS
  Filled 2020-02-03: qty 4

## 2020-02-03 MED ORDER — AMLODIPINE BESYLATE 5 MG PO TABS
5.0000 mg | ORAL_TABLET | Freq: Every day | ORAL | Status: DC
  Administered 2020-02-03 – 2020-02-08 (×5): 5 mg via ORAL
  Filled 2020-02-03 (×7): qty 1

## 2020-02-03 MED ORDER — CLONIDINE HCL 0.1 MG PO TABS
0.1000 mg | ORAL_TABLET | Freq: Two times a day (BID) | ORAL | Status: DC
  Administered 2020-02-03 – 2020-02-08 (×10): 0.1 mg via ORAL
  Filled 2020-02-03 (×10): qty 1

## 2020-02-03 MED ORDER — METOPROLOL SUCCINATE ER 50 MG PO TB24
200.0000 mg | ORAL_TABLET | Freq: Every day | ORAL | Status: DC
  Administered 2020-02-04 – 2020-02-08 (×4): 200 mg via ORAL
  Filled 2020-02-03 (×4): qty 4

## 2020-02-03 MED ORDER — ACETAMINOPHEN 500 MG PO TABS
1000.0000 mg | ORAL_TABLET | Freq: Four times a day (QID) | ORAL | Status: DC | PRN
  Administered 2020-02-03 – 2020-02-07 (×2): 1000 mg via ORAL
  Filled 2020-02-03 (×2): qty 2

## 2020-02-03 MED ORDER — IOHEXOL 9 MG/ML PO SOLN
500.0000 mL | ORAL | Status: AC
  Administered 2020-02-03 (×2): 500 mL via ORAL

## 2020-02-03 NOTE — ED Notes (Signed)
Nitro past patch removed from chest prior to going to MRI; pt reports the patch was placed last night by EMS en route to hospital. Pt denies any chest pain at this time.

## 2020-02-03 NOTE — ED Notes (Signed)
Patient transported to MRI 

## 2020-02-03 NOTE — Progress Notes (Signed)
[place] 02/03/20  Subjective:   LOS: 0 No intake/output data recorded.  Patient known to our practice from outpatient follow-up.  He was last seen by Dr. Juleen China in October 2021.  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  He has underlying problems of hypertension, diabetes and nonsteroidal use.  He was started on torsemide in October but not sure if he is taking it.  He presents to the emergency room today for chest pain and elevated blood pressure.  He also reported vomiting and feeling pale.  Initial blood pressure of 221/104.  Blood sugar of 146.  Patient was given IV labetalol with some improvement in blood pressure of 180/96.  Patient is also noted to have increased creatinine of 2.66.  His baseline appears to be 2.23 from November 15, 2019.  Outpatient urine protein to creatinine ratio of 15 g.  Hemoglobin A1c of 6.2%.  Today, patient reports some headache.  High blood pressure being managed with Nitropaste and oral medications Some shortness of breath also noted    Objective:  Vital signs in last 24 hours:  Temp:  [97.6 F (36.4 C)-99.1 F (37.3 C)] 99.1 F (37.3 C) (01/09 0544) Pulse Rate:  [72-105] 80 (01/09 1700) Resp:  [16-32] 26 (01/09 1700) BP: (131-235)/(78-129) 143/100 (01/09 1700) SpO2:  [94 %-100 %] 98 % (01/09 1700) Weight:  [70.3 kg] 70.3 kg (01/08 2236)  Weight change:  Filed Weights   02/02/20 2236  Weight: 70.3 kg    Intake/Output:    Intake/Output Summary (Last 24 hours) at 02/03/2020 1732 Last data filed at 02/03/2020 1014 Gross per 24 hour  Intake --  Output 725 ml  Net -725 ml    Physical Exam: General:  No acute distress, laying in the bed  HEENT  anicteric, moist oral mucous membrane  Pulm/lungs  normal breathing effort,  Mild diffuse creackles  CVS/Heart  irregular rhythm, no rub or gallop  Abdomen:   Soft, nontender  Extremities:  No peripheral edema  Neurologic:  Alert,  oriented, able to follow commands  Skin:  No acute rashes    Basic Metabolic Panel:  Recent Labs  Lab 02/02/20 2240  NA 141  K 3.6  CL 112*  CO2 16*  GLUCOSE 128*  BUN 25*  CREATININE 2.66*  CALCIUM 7.6*     CBC: Recent Labs  Lab 02/02/20 2240  WBC 13.5*  HGB 9.4*  HCT 28.7*  MCV 95.3  PLT 233     No results found for: HEPBSAG, HEPBSAB, HEPBIGM    Microbiology:  Recent Results (from the past 240 hour(s))  Resp Panel by RT-PCR (Flu A&B, Covid) Nasopharyngeal Swab     Status: None   Collection Time: 02/03/20  7:36 AM   Specimen: Nasopharyngeal Swab; Nasopharyngeal(NP) swabs in vial transport medium  Result Value Ref Range Status   SARS Coronavirus 2 by RT PCR NEGATIVE NEGATIVE Final    Comment: (NOTE) SARS-CoV-2 target nucleic acids are NOT DETECTED.  The SARS-CoV-2 RNA is generally detectable in upper respiratory specimens during the acute phase of infection. The lowest concentration of SARS-CoV-2 viral copies this assay can detect is 138 copies/mL. A negative result does not preclude SARS-Cov-2 infection and should not be used as the sole basis for treatment or other patient management decisions. A negative result may occur with  improper specimen collection/handling, submission of specimen other than nasopharyngeal swab, presence of viral mutation(s) within the areas targeted by this assay, and inadequate number  of viral copies(<138 copies/mL). A negative result must be combined with clinical observations, patient history, and epidemiological information. The expected result is Negative.  Fact Sheet for Patients:  EntrepreneurPulse.com.au  Fact Sheet for Healthcare Providers:  IncredibleEmployment.be  This test is no t yet approved or cleared by the [place] FDA and  has been authorized for detection and/or diagnosis of SARS-CoV-2 by FDA under an Emergency Use Authorization (EUA). This EUA will remain  in  effect (meaning this test can be used) for the duration of the COVID-19 declaration under Section 564(b)(1) of the Act, 21 U.S.C.section 360bbb-3(b)(1), unless the authorization is terminated  or revoked sooner.       Influenza A by PCR NEGATIVE NEGATIVE Final   Influenza B by PCR NEGATIVE NEGATIVE Final    Comment: (NOTE) The Xpert Xpress SARS-CoV-2/FLU/RSV plus assay is intended as an aid in the diagnosis of influenza from Nasopharyngeal swab specimens and should not be used as a sole basis for treatment. Nasal washings and aspirates are unacceptable for Xpert Xpress SARS-CoV-2/FLU/RSV testing.  Fact Sheet for Patients: EntrepreneurPulse.com.au  Fact Sheet for Healthcare Providers: IncredibleEmployment.be  This test is not yet approved or cleared by the [place] FDA and has been authorized for detection and/or diagnosis of SARS-CoV-2 by FDA under an Emergency Use Authorization (EUA). This EUA will remain in effect (meaning this test can be used) for the duration of the COVID-19 declaration under Section 564(b)(1) of the Act, 21 U.S.C. section 360bbb-3(b)(1), unless the authorization is terminated or revoked.  Performed at [facility], [address] [place], [place] [address]     Coagulation Studies: No results for input(s): LABPROT, INR in the last 72 hours.  Urinalysis: No results for input(s): COLORURINE, LABSPEC, PHURINE, GLUCOSEU, HGBUR, BILIRUBINUR, KETONESUR, PROTEINUR, UROBILINOGEN, NITRITE, LEUKOCYTESUR in the last 72 hours.  Invalid input(s): APPERANCEUR    Imaging: CT Abdomen Pelvis Wo Contrast  Result Date: 02/03/2020 CLINICAL DATA:  Left lower quadrant pain beginning yesterday. Nausea and vomiting. EXAM: CT ABDOMEN AND PELVIS WITHOUT CONTRAST TECHNIQUE: Multidetector CT imaging of the abdomen and pelvis was performed following the standard protocol without IV contrast. COMPARISON:  05/15/2019 FINDINGS:  Lower chest: No acute findings. Hepatobiliary: No mass visualized on this unenhanced exam. Gallbladder is unremarkable. No evidence of biliary ductal dilatation. Pancreas: No mass or inflammatory process visualized on this unenhanced exam. Spleen:  Within normal limits in size. Adrenals/Urinary tract: No evidence of urolithiasis or hydronephrosis. Stable small subcapsular cyst is seen in the lateral lower pole of the right kidney. Unremarkable unopacified urinary bladder. Stomach/Bowel: No evidence of obstruction, inflammatory process, or abnormal fluid collections. Normal appendix visualized. Severe sigmoid diverticulosis and associated muscular hypertrophy is again demonstrated, however there is no evidence of acute diverticulitis. Vascular/Lymphatic: No pathologically enlarged lymph nodes identified. No evidence of abdominal aortic aneurysm. Aortic atherosclerotic calcification noted. Reproductive:  No mass or other significant abnormality. Other:  None. Musculoskeletal: No suspicious bone lesions identified. Chronic avascular necrosis is again seen involving the left femoral head. IMPRESSION: No evidence of urolithiasis, hydronephrosis, or other acute findings. Severe sigmoid diverticulosis, without radiographic evidence of acute diverticulitis. 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 Aortic Atherosclerosis (ICD10-I70.0). Electronically Signed   By: Marlaine Hind M.D.   On: 02/03/2020 08:29   DG Chest 2 View  Result Date: 02/02/2020 CLINICAL DATA:  70 year old male with shortness of breath. EXAM: CHEST - 2 VIEW COMPARISON:  Chest radiograph dated 06/26/2018. FINDINGS: Left lung base linear atelectasis/scarring. No focal consolidation, pleural effusion  or pneumothorax. The cardiac silhouette is within limits. No acute osseous pathology. IMPRESSION: No active cardiopulmonary disease. Electronically Signed   By: Anner Crete M.D.   On: 02/02/2020 22:57   CT HEAD WO CONTRAST  Result Date:  02/03/2020 CLINICAL DATA:  Head EXAM: CT HEAD WITHOUT CONTRAST TECHNIQUE: Contiguous axial images were obtained from the base of the skull through the vertex without intravenous contrast. COMPARISON:  April 16, 2013 FINDINGS: Brain: There is hypodensity within the LEFT occipital lobe (series 3, image 13). This is increased in conspicuity in comparison to prior. No evidence of hemorrhage. Periventricular white matter hypodensities consistent with sequela of chronic microvascular ischemic disease. Global parenchymal volume loss. Remote RIGHT insular lacunar infarction versus dilated perivascular space. Vascular: Vascular calcifications of the carotid siphons. Skull: Normal. Negative for fracture or focal lesion. Sinuses/Orbits: No acute finding. Other: None. IMPRESSION: 1. Hypodensity within the LEFT occipital lobe is increased in conspicuity in comparison to prior study. This is nonspecific and could reflect age-indeterminate infarction versus artifact. Consider further evaluation with dedicated MRI if concern for infarction. 2. No evidence of hemorrhage. Electronically Signed   By: Valentino Saxon MD   On: 02/03/2020 14:10     Medications:    Scheduled Meds: . amLODipine  5 mg Oral Daily  . apixaban  5 mg Oral BID  . cloNIDine  0.1 mg Oral BID  . gabapentin  300 mg Oral QHS  . isosorbide mononitrate  60 mg Oral Daily  . [START ON 02/04/2020] metoprolol succinate  200 mg Oral Daily  . pantoprazole  40 mg Oral BID  . rosuvastatin  40 mg Oral Daily  . sodium chloride flush  3 mL Intravenous Q12H   Continuous Infusions: . sodium chloride     PRN Meds:.sodium chloride, acetaminophen, labetalol, ondansetron (ZOFRAN) IV, oxyCODONE, sodium chloride flush    Assessment/ Plan:  70 y.o. male with coronary artery disease, history of STEMI with stent in October 2003, PCI 2017.  Atrial fibrillation, diabetes, gout, chronic kidney disease  Principal Problem:   Hypertensive urgency Active Problems:    AKI (acute kidney injury) ([place])   Benign essential HTN   DM type 2 (diabetes mellitus, type 2) ([facility])   CAD (coronary artery disease)   Atrial fibrillation, chronic ([place])   Chronic kidney disease   #.  Acute kidney injury with CKD st 4 with baseline creatinine of 2.23, GFR 29 in October 2021.  Presenting creatinine of 2.66 Recent Labs    02/02/20 2240  CREATININE 2.66*  AKI likely secondary to accelerated hypertension Agree with restarting home medications Added clonidine 0.1 mg twice a day Follow blood pressure control closely   #Proteinuria Urine protein to creatinine ratio 15 g in October 2021 We will repeat Serum kappa lambda ratio of 1.35, SPEP negative in October 2021 Obtain screening serologies with a.m. labs  #. Anemia of CKD  Lab Results  Component Value Date   HGB 9.4 (L) 02/02/2020    #. HTN with CKD See above  #. Diabetes type 2 with CKD Hemoglobin A1C (%)  Date Value  09/27/2012 6.4 (H)   Hgb A1c MFr Bld (%)  Date Value  05/15/2019 6.0 (H)     LOS: 0 Erendira Crabtree 1/9/20225:32 PM  [facility] [place], [place]

## 2020-02-03 NOTE — H&P (Addendum)
History and Physical    Casey French QQP:[id number] DOB: [birth year] DOA: 02/03/2020  PCP: [name], MD  Patient coming from: home   Chief Complaint: chest pain  HPI: Casey French is a 70 y.o. male with medical history significant for htn, ckd 3/4, cad s/p stent 2003 and 2017, af on doac, anemia, who presents with the above.  Was in usual state of health when yesterday afternoon began to experience moderate substernal chest pain, unaffected by movement. Some mild associated shortness of breath. Around this time developed nausea with a few episodes nbnb small volume emesis as well as loose stools. Has chronic hrmorrhoids and chronic blood when stools, this has persisted. No mucous. No fevers. No abdominal pain. No back pain. Chest pain persists, is described as mild. Recent med change per nephro, torsemide substituted for hctz. Says Is compliant w/ home meds. No covid contacts.   ED Course:   Labetalol, zofran, 1 L, nephrology consult  Review of Systems: As per HPI otherwise 10 point review of systems negative.    Past Medical History:  Diagnosis Date  . Anemia   . Anginal pain ([place])   . Atrial fibrillation ([place])   . Chronic kidney disease   . Coronary artery disease   . Depression   . Diabetes mellitus without complication ([place])   . Diverticulosis   . GERD (gastroesophageal reflux disease)   . Gout   . Heart murmur   . High cholesterol   . Hypertension   . Myocardial infarction [facility])     Past Surgical History:  Procedure Laterality Date  . CARDIAC CATHETERIZATION    . CATARACT EXTRACTION W/PHACO Right 07/05/2019   Procedure: CATARACT EXTRACTION PHACO AND INTRAOCULAR LENS PLACEMENT ([place]) RIGHT DIABETIC;  Surgeon: Birder Robson, MD;  Location: [facility] ORS;  Service: Ophthalmology;  Laterality: Right;  US00:[id number] [id number] LOT [id number] h  . COLONOSCOPY WITH PROPOFOL N/A 04/01/2017   Procedure: COLONOSCOPY WITH PROPOFOL;  Surgeon: Lollie Sails, MD;   Location: [facility] ENDOSCOPY;  Service: Endoscopy;  Laterality: N/A;  . CORONARY ANGIOPLASTY    . right hand surgery     3 screws in right hand     reports that he quit smoking about 18 years ago. He has never used smokeless tobacco. He reports that he does not drink alcohol and does not use drugs.  Allergies  Allergen Reactions  . Morphine Anxiety    Other reaction(s): Other (See Comments) agitation   . Morphine And Related Anxiety    Family History  Problem Relation Age of Onset  . CAD Other     Prior to Admission medications   Medication Sig Start Date End Date Taking? Authorizing Provider  allopurinol (ZYLOPRIM) 100 MG tablet Take 100 mg by mouth daily.    [provider]  ASPIRIN 81 PO Take 1 tablet by mouth daily.    [provider]  ELIQUIS 5 MG TABS tablet Take 5 mg by mouth daily. 08/16/16   [provider]  fluticasone (FLONASE) 50 MCG/ACT nasal spray Place 2 sprays into both nostrils daily. 04/30/14   [provider]  gabapentin (NEURONTIN) 300 MG capsule Take 300 mg by mouth at bedtime. 09/25/16   [provider]  hydrochlorothiazide (HYDRODIURIL) 25 MG tablet Take 37.5 mg by mouth daily.  03/31/19   [provider]  isosorbide mononitrate (IMDUR) 60 MG 24 hr tablet Take 60 mg by mouth daily.    [provider]  lisinopril (ZESTRIL) 10 MG tablet Take 10 mg  by mouth daily. 06/30/18   [provider]  metFORMIN (GLUCOPHAGE-XR) 500 MG 24 hr tablet Take 1 tablet (500 mg total) by mouth daily with breakfast. 05/16/19   Fritzi Mandes, MD  metoprolol succinate (TOPROL-XL) 100 MG 24 hr tablet Take 100 mg by mouth daily.    [provider]  nitroGLYCERIN (NITROSTAT) 0.4 MG SL tablet Place 0.4 mg under the tongue every 5 (five) minutes as needed for chest pain.    [provider]  omeprazole (PRILOSEC) 20 MG capsule Take 20 mg by mouth daily.    [provider]  psyllium (METAMUCIL) 58.6 % packet  Take 1 packet by mouth in the morning and at bedtime.    [provider]  rosuvastatin (CRESTOR) 40 MG tablet Take 40 mg by mouth daily. 03/06/19   [provider]    Physical Exam: Vitals:   02/03/20 0730 02/03/20 0900 02/03/20 0930 02/03/20 0939  BP: (!) 201/100 (!) 218/95 (!) 215/122 (!) 180/96  Pulse: 73 83 81 90  Resp: (!) 23  18 16   Temp:      TempSrc:      SpO2: 95% 96% 96% 95%  Weight:      Height:        Constitutional: No acute distress Head: Atraumatic Eyes: Conjunctiva clear ENM: Moist mucous membranes. Normal dentition.  Neck: Supple Respiratory: Clear to auscultation bilaterally, no wheezing/rales/rhonchi. Normal respiratory effort. No accessory muscle use. . Cardiovascular: Regular rate and rhythm. No murmurs/rubs/gallops. Abdomen: Non-tender, non-distended. No masses. No rebound or guarding. Positive bowel sounds. Musculoskeletal: No joint deformity upper and lower extremities. Normal ROM, no contractures. Normal muscle tone.  Skin: No rashes, lesions, or ulcers.  Extremities: No peripheral edema. Palpable peripheral pulses. Neurologic: Alert, moving all 4 extremities. Psychiatric: Normal insight and judgement.   Labs on Admission: I have personally reviewed following labs and imaging studies  CBC: Recent Labs  Lab 02/02/20 2240  WBC 13.5*  HGB 9.4*  HCT 28.7*  MCV 95.3  PLT 093   Basic Metabolic Panel: Recent Labs  Lab 02/02/20 2240  NA 141  K 3.6  CL 112*  CO2 16*  GLUCOSE 128*  BUN 25*  CREATININE 2.66*  CALCIUM 7.6*   GFR: Estimated Creatinine Clearance: 26.4 mL/min (A) (by C-G formula based on SCr of 2.66 mg/dL (H)). Liver Function Tests: Recent Labs  Lab 02/02/20 2240  AST 23  ALT 13  ALKPHOS 99  BILITOT 1.1  PROT 5.9*  ALBUMIN 2.1*   Recent Labs  Lab 02/03/20 0531  LIPASE 25   No results for input(s): AMMONIA in the last 168 hours. Coagulation Profile: No results for input(s): INR, PROTIME in the last  168 hours. Cardiac Enzymes: No results for input(s): CKTOTAL, CKMB, CKMBINDEX, TROPONINI in the last 168 hours. BNP (last 3 results) No results for input(s): PROBNP in the last 8760 hours. HbA1C: No results for input(s): HGBA1C in the last 72 hours. CBG: No results for input(s): GLUCAP in the last 168 hours. Lipid Profile: No results for input(s): CHOL, HDL, LDLCALC, TRIG, CHOLHDL, LDLDIRECT in the last 72 hours. Thyroid Function Tests: No results for input(s): TSH, T4TOTAL, FREET4, T3FREE, THYROIDAB in the last 72 hours. Anemia Panel: No results for input(s): VITAMINB12, FOLATE, FERRITIN, TIBC, IRON, RETICCTPCT in the last 72 hours. Urine analysis:    Component Value Date/Time   COLORURINE YELLOW (A) 12/10/2018 1632   APPEARANCEUR CLEAR (A) 12/10/2018 1632   LABSPEC 1.024 12/10/2018 1632   PHURINE 6.0 12/10/2018 1632  GLUCOSEU 150 (A) 12/10/2018 1632   HGBUR NEGATIVE 12/10/2018 1632   BILIRUBINUR NEGATIVE 12/10/2018 [place] 12/10/2018 1632   PROTEINUR >=300 (A) 12/10/2018 1632   NITRITE NEGATIVE 12/10/2018 1632   LEUKOCYTESUR NEGATIVE 12/10/2018 1632    Radiological Exams on Admission: CT Abdomen Pelvis Wo Contrast  Result Date: 02/03/2020 CLINICAL DATA:  Left lower quadrant pain beginning yesterday. Nausea and vomiting. EXAM: CT ABDOMEN AND PELVIS WITHOUT CONTRAST TECHNIQUE: Multidetector CT imaging of the abdomen and pelvis was performed following the standard protocol without IV contrast. COMPARISON:  05/15/2019 FINDINGS: Lower chest: No acute findings. Hepatobiliary: No mass visualized on this unenhanced exam. Gallbladder is unremarkable. No evidence of biliary ductal dilatation. Pancreas: No mass or inflammatory process visualized on this unenhanced exam. Spleen:  Within normal limits in size. Adrenals/Urinary tract: No evidence of urolithiasis or hydronephrosis. Stable small subcapsular cyst is seen in the lateral lower pole of the right kidney. Unremarkable  unopacified urinary bladder. Stomach/Bowel: No evidence of obstruction, inflammatory process, or abnormal fluid collections. Normal appendix visualized. Severe sigmoid diverticulosis and associated muscular hypertrophy is again demonstrated, however there is no evidence of acute diverticulitis. Vascular/Lymphatic: No pathologically enlarged lymph nodes identified. No evidence of abdominal aortic aneurysm. Aortic atherosclerotic calcification noted. Reproductive:  No mass or other significant abnormality. Other:  None. Musculoskeletal: No suspicious bone lesions identified. Chronic avascular necrosis is again seen involving the left femoral head. IMPRESSION: No evidence of urolithiasis, hydronephrosis, or other acute findings. Severe sigmoid diverticulosis, without radiographic evidence of acute diverticulitis. 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 Aortic Atherosclerosis (ICD10-I70.0). Electronically Signed   By: Marlaine Hind M.D.   On: 02/03/2020 08:29   DG Chest 2 View  Result Date: 02/02/2020 CLINICAL DATA:  70 year old male with shortness of breath. EXAM: CHEST - 2 VIEW COMPARISON:  Chest radiograph dated 06/26/2018. FINDINGS: Left lung base linear atelectasis/scarring. No focal consolidation, pleural effusion or pneumothorax. The cardiac silhouette is within limits. No acute osseous pathology. IMPRESSION: No active cardiopulmonary disease. Electronically Signed   By: Anner Crete M.D.   On: 02/02/2020 22:57    EKG: Independently reviewed. Nsr, no overt ischemic changes  Assessment/Plan Principal Problem:   Hypertensive urgency Active Problems:   AKI (acute kidney injury) ([place])   Benign essential HTN   DM type 2 (diabetes mellitus, type 2) ([facility])   CAD (coronary artery disease)   Atrial fibrillation, chronic ([place])   Chronic kidney disease   # Hypertensive urgency Here bps elevated to 220s. Has had worsening kidney function as of late so cr today not significantly elevated from  baseline that's more in the low 2s. Think unlikely aortic dissection given mild chest pain. Second trop is mildly elevated to 39 so acs remains on ddx though at this point more likely demand. - labetalol prn, cont home metop - cont home imdur - added clonidine 0.1 bid as well as amlod 5 per cardiologya nd metoprolol increased to 200 per cardiology - will also check renal duplex per helpful cardiology recommendation  # AKI on ckd Most recent creatinine little above 2, here 2.66 in setting of severe htn. Followed outpt by nephrology, they are consulted - f/u nephrology recs - bp as above  # Headache # Abnormal neuroimaging A little while after admission patient complained of severe headache. He says he has a history of such headache. Neuro exam performed by me normal (cn2-12, 5/5 upper/lower strength, distal sensation intact). CTH shows non specific left sided lucency. Discussed w/ neuro ([facility])  who said this could be CVA and if so could explain hypertensive urgency, he advises non-con mri - ordering non-con mri UPDATE 1/9 18:52 - MRI shows PRES. Discussed w/ [facility] neurology. No change in current mgmt, bp now much improved, will add seizure precautions  # Nausea/vomiting Likely contributing to above. Likely viral. Hx diverticulitis but denies abd pain and none seen on non-con ct a/p which was unremarkable. covid negative - ppi bid - f/u gi pathogen panel - zofran prn  # Tropinemia # CAD Hx stemi with stent in 2003 and again in 2017. Here with trops 17>39>61. ekg w/o signs overt ischemia - monitor on tele - continue to trend troponins, will consult cardiology given symptoms and CAD history. - cont home statin, BB, eliquis  # DM Not on meds at home, here glucose not significantly elevated - daily fasting glucose  # GI bleeding Pt reports chronic bleeding from hemorrhoids. Hemoglobin is 9.4 which is stable from priors - monitor  # A fib Here sinus - cont metop, eliquis  #  Chronic pain - cont home gabapentin   DVT prophylaxis: eliquis Code Status: full  Family Communication: wife updated @ bedside  Consults called: nephrology    Status is: Inpatient  Remains inpatient appropriate because:Inpatient level of care appropriate due to severity of illness   Dispo: The patient is from: Home              Anticipated d/c is to: Home              Anticipated d/c date is: 2 days              Patient currently is not medically stable to d/c.        Desma Maxim MD [facility] Pager [phone]  If 7PM-7AM, please contact night-coverage [email or web address] Password [email or web address]  02/03/2020, 11:00 AM

## 2020-02-03 NOTE — ED Notes (Addendum)
Pt eating dinner at bedside. States pain is better.

## 2020-02-03 NOTE — ED Notes (Signed)
Patient transported to CT 

## 2020-02-03 NOTE — ED Notes (Signed)
Pt informed of his NPO after midnight status for planned renal ultrasound

## 2020-02-03 NOTE — ED Notes (Signed)
Taken to CT.

## 2020-02-03 NOTE — ED Notes (Signed)
Medication Reconciliation Report  For Home History Technicians  HIGHLIGHTS:  1. The patient WAS personally interviewed 2. If not, what was the main source used: PHARMACY RECORDS 3. Does the patient appear to take any anti-coagulation agents (e.g. warfarin, Eliquis or Xarelto): YES 4. Does the patient appear to take any anti-convulsant agents (e.g. divalproex, levetiracetam or phenytoin): NO 5. Does the patient appear to use any insulin products (e.g. Lantus, Novolin or Humalog): NO 6. Does the patient appear to take any "beta-blockers" (e.g. metoprolol, carvedilol or bisoprolol: YES  BARRIERS:  1. Were there any barriers that prevented or complicated the medication reconciliation process: YES 2. If yes, what was the primary barrier encountered: Poor historian 3. Does the patient appear compliant with prescribed medications: YES 4. Does the patient express any barriers with compliance: NO 5. What is the primary barrier the patient reports: None   NOTES:[Include any concerns, remarks or complaints the patient expresses regarding medication therapy. Any observations or other information that might be useful to the treatment team can also be included. Immediate needs or concerns should be referred to the RN or appropriate member of the treatment team.]  The patient reports taking APIXABAN and ROSUVASTATIN, but pharmacy records indicate former last filled for 30 day supply 09/2019 and the latter back in 2020. Patient's family at bedside reports an excess supply of APIXABAN they have been working through when asked why no refill since September.   Colen Darling, CPhT [place] [facility]. [place], [place] [address] [phone]  ** The above is intended solely for informational and/or communicative purposes. It should in no way be considered an endorsement of any specific treatment, therapy or action. **

## 2020-02-03 NOTE — ED Provider Notes (Signed)
[facility] Emergency Department Provider Note   ____________________________________________   Event Date/Time   First MD Initiated Contact with Patient 02/03/20 [phone]     (approximate)  I have reviewed the triage vital signs and the nursing notes.   HISTORY  Chief Complaint chest pain and Vomiting    HPI Casey French is a 70 y.o. male brought to the ED via EMS with a chief complaint of chest pain, nausea, vomiting, diarrhea, cough, shortness of breath, and abdominal pain. Onset of symptoms 1 day ago. Denies fever, dysuria. Patient is vaccinated against Covid-19. Denies recent travel or trauma.  Received nitroglycerin paste, aspirin and Zofran by EMS prior to arrival.     Past Medical History:  Diagnosis Date  . Anemia   . Anginal pain ([place])   . Atrial fibrillation ([place])   . Chronic kidney disease   . Coronary artery disease   . Depression   . Diabetes mellitus without complication ([place])   . Diverticulosis   . GERD (gastroesophageal reflux disease)   . Gout   . Heart murmur   . High cholesterol   . Hypertension   . Myocardial infarction [facility])     Patient Active Problem List   Diagnosis Date Noted  . Hematemesis 05/15/2019  . Hematochezia 05/15/2019  . Chronic anticoagulation 05/15/2019  . Atrial fibrillation, chronic ([place]) 05/15/2019  . GI bleed 05/15/2019  . Chronic kidney disease 05/15/2019  . Unstable angina ([place]) 11/10/2016  . Angina at rest [facility]) 05/01/2015  . Hypertensive urgency 06/14/2014  . Acute diverticulitis 06/14/2014  . AKI (acute kidney injury) ([place]) 06/14/2014  . Benign essential HTN 06/14/2014  . Hyperlipidemia 06/14/2014  . DM type 2 (diabetes mellitus, type 2) ([place]) 06/14/2014  . CAD (coronary artery disease) 06/14/2014  . GERD without esophagitis 06/14/2014  . Closed head injury 03/24/2013    Past Surgical History:  Procedure Laterality Date  . CARDIAC CATHETERIZATION    . CATARACT EXTRACTION W/PHACO  Right 07/05/2019   Procedure: CATARACT EXTRACTION PHACO AND INTRAOCULAR LENS PLACEMENT ([place]) RIGHT DIABETIC;  Surgeon: Birder Robson, MD;  Location: [facility] ORS;  Service: Ophthalmology;  Laterality: Right;  US00:59.3 CDE8.41 LOT [id number] h  . COLONOSCOPY WITH PROPOFOL N/A 04/01/2017   Procedure: COLONOSCOPY WITH PROPOFOL;  Surgeon: Lollie Sails, MD;  [facility] ENDOSCOPY;  Service: Endoscopy;  Laterality: N/A;  . CORONARY ANGIOPLASTY    . right hand surgery     3 screws in right hand    Prior to Admission medications   Medication Sig Start Date End Date Taking? Authorizing Provider  allopurinol (ZYLOPRIM) 100 MG tablet Take 100 mg by mouth daily.    [provider]  ASPIRIN 81 PO Take 1 tablet by mouth daily.    [provider]  ELIQUIS 5 MG TABS tablet Take 5 mg by mouth daily. 08/16/16   [provider]  fluticasone (FLONASE) 50 MCG/ACT nasal spray Place 2 sprays into both nostrils daily. 04/30/14   [provider]  gabapentin (NEURONTIN) 300 MG capsule Take 300 mg by mouth at bedtime. 09/25/16   [provider]  hydrochlorothiazide (HYDRODIURIL) 25 MG tablet Take 37.5 mg by mouth daily.  03/31/19   [provider]  isosorbide mononitrate (IMDUR) 60 MG 24 hr tablet Take 60 mg by mouth daily.    [provider]  lisinopril (ZESTRIL) 10 MG tablet Take 10 mg by mouth daily. 06/30/18   [provider]  metFORMIN (GLUCOPHAGE-XR) 500 MG 24 hr tablet Take  1 tablet (500 mg total) by mouth daily with breakfast. 05/16/19   Fritzi Mandes, MD  metoprolol succinate (TOPROL-XL) 100 MG 24 hr tablet Take 100 mg by mouth daily.    [provider]  nitroGLYCERIN (NITROSTAT) 0.4 MG SL tablet Place 0.4 mg under the tongue every 5 (five) minutes as needed for chest pain.    [provider]  omeprazole (PRILOSEC) 20 MG capsule Take 20 mg by mouth daily.    [provider]  psyllium (METAMUCIL) 58.6 % packet Take 1  packet by mouth in the morning and at bedtime.    [provider]  rosuvastatin (CRESTOR) 40 MG tablet Take 40 mg by mouth daily. 03/06/19   [provider]    Allergies Morphine and Morphine and related  Family History  Problem Relation Age of Onset  . CAD Other     Social History Social History   Tobacco Use  . Smoking status: Former Smoker    Quit date: 03/24/2001    Years since quitting: 18.8  . Smokeless tobacco: Never Used  Substance Use Topics  . Alcohol use: No    Alcohol/week: 0.0 standard drinks  . Drug use: No    Review of Systems  Constitutional: No fever/chills Eyes: No visual changes. ENT: No sore throat. Cardiovascular: Positive for chest pain. Respiratory: Positive for cough and shortness of breath. Gastrointestinal: Positive for abdominal pain, nausea, vomiting, and diarrhea.  No constipation. Genitourinary: Negative for dysuria. Musculoskeletal: Negative for back pain. Skin: Negative for rash. Neurological: Negative for headaches, focal weakness or numbness.   ____________________________________________   PHYSICAL EXAM:  VITAL SIGNS: ED Triage Vitals  Enc Vitals Group     BP 02/02/20 2235 (!) 137/92     Pulse Rate 02/02/20 2235 72     Resp 02/02/20 2235 16     Temp 02/02/20 2235 98.7 F (37.1 C)     Temp Source 02/02/20 2235 Oral     SpO2 02/02/20 2235 100 %     Weight 02/02/20 2236 155 lb (70.3 kg)     Height 02/02/20 2236 5\' 9"  (1.753 m)     Head Circumference --      Peak Flow --      Pain Score 02/02/20 2235 8     Pain Loc --      Pain Edu? --      Excl. in [place]? --     Constitutional: Alert and oriented.  Uncomfortable appearing and in mild acute distress. Eyes: Conjunctivae are normal. PERRL. EOMI. Head: Atraumatic. Nose: No congestion/rhinnorhea. Mouth/Throat: Mucous membranes are mildly dry.   Neck: No stridor.   Cardiovascular: Normal rate, regular rhythm. Grossly normal heart sounds.  Good peripheral  circulation. Respiratory: Normal respiratory effort.  No retractions. Lungs CTAB. Gastrointestinal: Soft and mildly tender to palpation umbilicus without rebound or guarding. No distention. No abdominal bruits. No CVA tenderness. Musculoskeletal: No lower extremity tenderness nor edema.  No joint effusions. Neurologic:  Normal speech and language. No gross focal neurologic deficits are appreciated.  Skin:  Skin is warm, dry and intact. No rash noted.  No petechiae. Psychiatric: Mood and affect are normal. Speech and behavior are normal.  ____________________________________________   LABS (all labs ordered are listed, but only abnormal results are displayed)  Labs Reviewed  CBC - Abnormal; Notable for the following components:      Result Value   WBC 13.5 (*)    RBC 3.01 (*)    Hemoglobin 9.4 (*)  HCT 28.7 (*)    All other components within normal limits  COMPREHENSIVE METABOLIC PANEL - Abnormal; Notable for the following components:   Chloride 112 (*)    CO2 16 (*)    Glucose, Bld 128 (*)    BUN 25 (*)    Creatinine, Ser 2.66 (*)    Calcium 7.6 (*)    Total Protein 5.9 (*)    Albumin 2.1 (*)    GFR, Estimated 25 (*)    All other components within normal limits  TROPONIN I (HIGH SENSITIVITY) - Abnormal; Notable for the following components:   Troponin I (High Sensitivity) 39 (*)    All other components within normal limits  RESP PANEL BY RT-PCR (FLU A&B, COVID) ARPGX2  LIPASE, BLOOD  TROPONIN I (HIGH SENSITIVITY)   ____________________________________________  EKG  ED ECG REPORT I, Raimundo Corbit J, the attending physician, personally viewed and interpreted this ECG.   Date: 02/03/2020  EKG Time: 2237  Rate: 78  Rhythm: normal EKG, normal sinus rhythm  Axis: Normal  Intervals:none  ST&T Change: Nonspecific  ____________________________________________  RADIOLOGY I, Wayden Schwertner J, personally viewed and evaluated these images (plain radiographs) as part of my medical  decision making, as well as reviewing the written report by the radiologist.  ED MD interpretation: No acute cardiopulmonary process  Official radiology report(s): DG Chest 2 View  Result Date: 02/02/2020 CLINICAL DATA:  70 year old male with shortness of breath. EXAM: CHEST - 2 VIEW COMPARISON:  Chest radiograph dated 06/26/2018. FINDINGS: Left lung base linear atelectasis/scarring. No focal consolidation, pleural effusion or pneumothorax. The cardiac silhouette is within limits. No acute osseous pathology. IMPRESSION: No active cardiopulmonary disease. Electronically Signed   By: Anner Crete M.D.   On: 02/02/2020 22:57    ____________________________________________   PROCEDURES  Procedure(s) performed (including Critical Care):  .1-3 Lead EKG Interpretation Performed by: Paulette Blanch, MD Authorized by: Paulette Blanch, MD     Interpretation: normal     ECG rate:  72   ECG rate assessment: normal     Rhythm: sinus rhythm     Ectopy: none     Conduction: normal   Comments:     Patient placed on cardiac monitor to evaluate for arrhythmias     ____________________________________________   INITIAL IMPRESSION / ASSESSMENT AND PLAN / ED COURSE  As part of my medical decision making, I reviewed the following data within the [place] notes reviewed and incorporated, Labs reviewed, EKG interpreted, Old chart reviewed, Radiograph reviewed and Notes from prior ED visits     70 year old male presenting with cough, chest pain, shortness of breath, abdominal pain, N/V/D. Differential includes, but is not limited to, viral syndrome, bronchitis including COPD exacerbation, pneumonia, reactive airway disease including asthma, CHF including exacerbation with or without pulmonary/interstitial edema, pneumothorax, ACS, thoracic trauma, pulmonary embolism, gastritis, gastroenteritis, SBO, diverticulitis, etc.  Laboratory results demonstrate mild to moderate  leukocytosis, acute on chronic kidney injury.  Troponin has increased from 17-39.  Remains hypertensive.  Will obtain CT abdomen/pelvis as patient has had history of diverticulitis.  Initiate IV fluid resuscitation, IV Fentanyl for pain, IV Zofran for nausea/vomiting.  Obtain COVID-19 swab.  Consider IV antihypertensive if blood pressure remains elevated despite pain control.  Anticipate hospitalization.  Clinical Course as of 02/03/20 0701  Sun Feb 03, 2020  0701 Care transferred to Dr. Quentin Cornwall at change of shift pending CT scan and admission. [JS]    Clinical Course User Index [JS] Paulette Blanch,  MD     ____________________________________________   FINAL CLINICAL IMPRESSION(S) / ED DIAGNOSES  Final diagnoses:  Lower abdominal pain  Nausea vomiting and diarrhea  Chest pain, unspecified type  Secondary hypertension  AKI (acute kidney injury) [facility])     ED Discharge Orders    None      *Please note:  Casey French was evaluated in Emergency Department on 02/03/2020 for the symptoms described in the history of present illness. He was evaluated in the context of the global COVID-19 pandemic, which necessitated consideration that the patient might be at risk for infection with the SARS-CoV-2 virus that causes COVID-19. Institutional protocols and algorithms that pertain to the evaluation of patients at risk for COVID-19 are in a state of rapid change based on information released by regulatory bodies including the CDC and federal and state organizations. These policies and algorithms were followed during the patient's care in the ED.  Some ED evaluations and interventions may be delayed as a result of limited staffing during and the pandemic.*   Note:  This document was prepared using Dragon voice recognition software and may include unintentional dictation errors.   Paulette Blanch, MD 02/03/20 [phone]

## 2020-02-03 NOTE — Progress Notes (Signed)
ANTICOAGULATION CONSULT NOTE - Initial Consult  Pharmacy Consult for apixaban  Indication: atrial fibrillation  Allergies  Allergen Reactions  . Morphine Anxiety    Other reaction(s): Other (See Comments) agitation   . Morphine And Related Anxiety    Patient Measurements: Height: 5\' 9"  (175.3 cm) Weight: 70.3 kg (155 lb) IBW/kg (Calculated) : 70.7  Vital Signs: Temp: 99.1 F (37.3 C) (01/09 0544) Temp Source: Oral (01/09 0544) BP: 180/96 (01/09 0939) Pulse Rate: 90 (01/09 0939)  Labs: Recent Labs    02/02/20 2240 02/03/20 0531 02/03/20 1026  HGB 9.4*  --   --   HCT 28.7*  --   --   PLT 233  --   --   CREATININE 2.66*  --   --   TROPONINIHS 17 39* 67*    Estimated Creatinine Clearance: 26.4 mL/min (A) (by C-G formula based on SCr of 2.66 mg/dL (H)).   Medical History: Past Medical History:  Diagnosis Date  . Anemia   . Anginal pain ([place])   . Atrial fibrillation ([place])   . Chronic kidney disease   . Coronary artery disease   . Depression   . Diabetes mellitus without complication ([place])   . Diverticulosis   . GERD (gastroesophageal reflux disease)   . Gout   . Heart murmur   . High cholesterol   . Hypertension   . Myocardial infarction [facility])     Assessment: 70 yo male with PMH of HTN, A. Fib,  CKD, and Anemia. Patient admitted with N/V/D, lower abdominal pain, and chest pain. Pharmacy has been consulted for apixaban dosing. Patient reports last dose was Friday.    Plan:  Will start Apixaban 5mg  twice daily  CBC at least every 3 days per protocol.   Pernell Dupre, PharmD, BCPS Clinical Pharmacist 02/03/2020 11:27 AM

## 2020-02-03 NOTE — ED Notes (Signed)
Pt assisted to toilet and back to bed. Pt had small BM in underwear. Soiled underwear thrown away and clean brief given to pt. Pt comfortable with no other needs at this time.

## 2020-02-03 NOTE — Consult Note (Signed)
[facility] Cardiology  CARDIOLOGY CONSULT NOTE  Patient ID: Casey French MRN: [id number] DOB/AGE: 1950-05-18 70 y.o.  Admit date: 02/03/2020 Referring Physician [facility] Primary Physician [facility] Primary Cardiologist [place] Reason for Consultation chest pain  HPI: 70 year old gentleman referred for evaluation of chest pain.  The patient has known coronary artery disease, status post inferior STEMI and Cypher stent RCA 11/22/2001.  Cardiac catheterization 09/03/2003 revealed occluded RCA.  The patient underwent PCI with Xience Alpine stent mid LAD 07/21/2015.  He was in his usual state of health yesterday, when he developed headache, nausea and vomiting with associated mild chest tightness and shortness of breath.  He presented to [facility] emergency room where he was noted to be markedly hypertensive with systolic blood pressures greater than 220.  ECG revealed sinus rhythm at a rate of 78 bpm without acute ischemic ST-T wave changes.  Admission labs were notable for mildly elevated high-sensitivity troponin (17, 39, 67, 81), and worsening renal function with BUN and creatinine 25 and 2.66, respectively.  The patient has a history of paroxysmal atrial fibrillation, on Eliquis for stroke prevention.  Patient has essential hypertension, on metoprolol succinate 150 mg daily, HCTZ 25 mg daily, and lisinopril 20 mg daily.  In the emergency room, the patient has received labetalol for blood pressure control.  Review of systems complete and found to be negative unless listed above     Past Medical History:  Diagnosis Date  . Anemia   . Anginal pain ([place])   . Atrial fibrillation ([place])   . Chronic kidney disease   . Coronary artery disease   . Depression   . Diabetes mellitus without complication ([place])   . Diverticulosis   . GERD (gastroesophageal reflux disease)   . Gout   . Heart murmur   . High cholesterol   . Hypertension   . Myocardial infarction [facility])     Past Surgical History:  Procedure  Laterality Date  . CARDIAC CATHETERIZATION    . CATARACT EXTRACTION W/PHACO Right 07/05/2019   Procedure: CATARACT EXTRACTION PHACO AND INTRAOCULAR LENS PLACEMENT ([place]) RIGHT DIABETIC;  Surgeon: Birder Robson, MD;  Location: [facility] ORS;  Service: Ophthalmology;  Laterality: Right;  US00:59.3 CDE8.41 LOT [id number] h  . COLONOSCOPY WITH PROPOFOL N/A 04/01/2017   Procedure: COLONOSCOPY WITH PROPOFOL;  Surgeon: Lollie Sails, MD;  Location: [facility] ENDOSCOPY;  Service: Endoscopy;  Laterality: N/A;  . CORONARY ANGIOPLASTY    . right hand surgery     3 screws in right hand    (Not in a hospital admission)  Social History   Socioeconomic History  . Marital status: Married    Spouse name: Not on file  . Number of children: Not on file  . Years of education: Not on file  . Highest education level: Not on file  Occupational History  . Not on file  Tobacco Use  . Smoking status: Former Smoker    Quit date: 03/24/2001    Years since quitting: 18.8  . Smokeless tobacco: Never Used  Substance and Sexual Activity  . Alcohol use: No    Alcohol/week: 0.0 standard drinks  . Drug use: No  . Sexual activity: Not on file  Other Topics Concern  . Not on file  Social History Narrative  . Not on file   Social Determinants of Health   Financial Resource Strain: Not on file  Food Insecurity: Not on file  Transportation Needs: Not on file  Physical Activity: Not on file  Stress: Not on file  Social  Connections: Not on file  Intimate Partner Violence: Not on file    Family History  Problem Relation Age of Onset  . CAD Other       Review of systems complete and found to be negative unless listed above      PHYSICAL EXAM  General: Well developed, well nourished, in no acute distress HEENT:  Normocephalic and atramatic Neck:  No JVD.  Lungs: Clear bilaterally to auscultation and percussion. Heart: HRRR . Normal S1 and S2 without gallops or murmurs.  Abdomen: Bowel sounds are  positive, abdomen soft and non-tender  Msk:  Back normal, normal gait. Normal strength and tone for age. Extremities: No clubbing, cyanosis or edema.   Neuro: Alert and oriented X 3. Psych:  Good affect, responds appropriately  Labs:   Lab Results  Component Value Date   WBC 13.5 (H) 02/02/2020   HGB 9.4 (L) 02/02/2020   HCT 28.7 (L) 02/02/2020   MCV 95.3 02/02/2020   PLT 233 02/02/2020    Recent Labs  Lab 02/02/20 2240  NA 141  K 3.6  CL 112*  CO2 16*  BUN 25*  CREATININE 2.66*  CALCIUM 7.6*  PROT 5.9*  BILITOT 1.1  ALKPHOS 99  ALT 13  AST 23  GLUCOSE 128*   Lab Results  Component Value Date   CKTOTAL 198 03/23/2013   CKMB 4.6 (H) 03/23/2013   TROPONINI <0.03 06/26/2018    Lab Results  Component Value Date   CHOL 142 11/10/2016   CHOL 194 09/27/2012   Lab Results  Component Value Date   HDL 45 11/10/2016   HDL 49 09/27/2012   Lab Results  Component Value Date   LDLCALC 75 11/10/2016   LDLCALC 98 09/27/2012   Lab Results  Component Value Date   TRIG 109 11/10/2016   TRIG 233 (H) 09/27/2012   Lab Results  Component Value Date   CHOLHDL 3.2 11/10/2016   No results found for: LDLDIRECT    Radiology: CT Abdomen Pelvis Wo Contrast  Result Date: 02/03/2020 CLINICAL DATA:  Left lower quadrant pain beginning yesterday. Nausea and vomiting. EXAM: CT ABDOMEN AND PELVIS WITHOUT CONTRAST TECHNIQUE: Multidetector CT imaging of the abdomen and pelvis was performed following the standard protocol without IV contrast. COMPARISON:  05/15/2019 FINDINGS: Lower chest: No acute findings. Hepatobiliary: No mass visualized on this unenhanced exam. Gallbladder is unremarkable. No evidence of biliary ductal dilatation. Pancreas: No mass or inflammatory process visualized on this unenhanced exam. Spleen:  Within normal limits in size. Adrenals/Urinary tract: No evidence of urolithiasis or hydronephrosis. Stable small subcapsular cyst is seen in the lateral lower pole of the  right kidney. Unremarkable unopacified urinary bladder. Stomach/Bowel: No evidence of obstruction, inflammatory process, or abnormal fluid collections. Normal appendix visualized. Severe sigmoid diverticulosis and associated muscular hypertrophy is again demonstrated, however there is no evidence of acute diverticulitis. Vascular/Lymphatic: No pathologically enlarged lymph nodes identified. No evidence of abdominal aortic aneurysm. Aortic atherosclerotic calcification noted. Reproductive:  No mass or other significant abnormality. Other:  None. Musculoskeletal: No suspicious bone lesions identified. Chronic avascular necrosis is again seen involving the left femoral head. IMPRESSION: No evidence of urolithiasis, hydronephrosis, or other acute findings. Severe sigmoid diverticulosis, without radiographic evidence of acute diverticulitis. 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 Aortic Atherosclerosis (ICD10-I70.0). Electronically Signed   By: Marlaine Hind M.D.   On: 02/03/2020 08:29   DG Chest 2 View  Result Date: 02/02/2020 CLINICAL DATA:  70 year old male with shortness of breath. EXAM: CHEST -  2 VIEW COMPARISON:  Chest radiograph dated 06/26/2018. FINDINGS: Left lung base linear atelectasis/scarring. No focal consolidation, pleural effusion or pneumothorax. The cardiac silhouette is within limits. No acute osseous pathology. IMPRESSION: No active cardiopulmonary disease. Electronically Signed   By: Anner Crete M.D.   On: 02/02/2020 22:57    EKG: Sinus rhythm with nonspecific ST-T wave abnormalities  ASSESSMENT AND PLAN:   1.  Chest pain, mild in nature, mildly elevated high-sensitivity troponin, nondiagnostic ECG, likely not due to to acute coronary syndrome, more likely due to hypertensive  urgency with systolic blood pressures greater than 220 2.  Coronary artery disease, history of STEMI and Cypher stent RCA 11/22/2001, Xience Alpine stent mid LAD 07/21/2015 3.  Hypertensive urgency,  medications include metoprolol succinate, lisinopril, HCTZ, blood pressure mildly improved after labetalol 4.  Acute kidney injury, with underlying chronic kidney disease, BUN and creatinine 28 and 2.66, respectively 5.  Paroxysmal atrial fibrillation, currently in sinus rhythm, on Eliquis for stroke prevention  Recommendations  1.  Agree with overall current therapy 2.  Defer full dose anticoagulation at this time 3.  Defer cardiac catheterization at this time 4.  Continue Eliquis for stroke prevention 5.  Continue isosorbide mononitrate 60 mg daily 6.  Uptitrate metoprolol succinate to 200 mg daily 7.  Add amlodipine 5 mg daily 8.  Hold lisinopril for now 9.  Consider renal consult 10.  Consider renal artery Doppler study 12.  Review 2D echocardiogram  Signed: Isaias Cowman MD,PhD, [facility] 02/03/2020, 2:06 PM

## 2020-02-03 NOTE — ED Provider Notes (Signed)
Case assumed at 7 AM.  Briefly, 70 year old male here with somewhat atypical chest pain, nausea, and weakness.  The patient is markedly hypertensive but nontoxic-appearing.  EKG is nonischemic but troponin uptrending.  CT abdomen and pelvis shows no evidence of diverticulitis.  Suspect hypertensive urgency with worsening renal function.  Patient sees Dr. Juleen China as an outpatient.  Will admit for blood pressure control and monitoring.   Duffy Bruce, MD 02/03/20 1259

## 2020-02-03 NOTE — ED Notes (Addendum)
Date and time results received: 02/03/20 6:04 AM (use smartphrase ".now" to insert current time)  Test: Troponin Critical Value: increased from 17 to 39  Name of Provider Notified: Dr. Beather Arbour  Orders Received? Or Actions Taken?: No new orders at this time

## 2020-02-03 NOTE — ED Notes (Signed)
Pt finished contrast. CT called. Pt denies further needs at this time.

## 2020-02-04 ENCOUNTER — Inpatient Hospital Stay: Admit: 2020-02-04 | Payer: Medicare PPO

## 2020-02-04 ENCOUNTER — Inpatient Hospital Stay
Admit: 2020-02-04 | Discharge: 2020-02-04 | Disposition: A | Discharge status: Home / Self Care | Payer: Medicare PPO | Attending: Cardiology | Admitting: Cardiology

## 2020-02-04 ENCOUNTER — Inpatient Hospital Stay: Payer: Medicare PPO

## 2020-02-04 ENCOUNTER — Encounter: Payer: Self-pay | Admitting: Obstetrics and Gynecology

## 2020-02-04 LAB — CBC
HCT: 23.7 % — ABNORMAL LOW (ref 39.0–52.0)
Hemoglobin: 8 g/dL — ABNORMAL LOW (ref 13.0–17.0)
MCH: 31.4 pg (ref 26.0–34.0)
MCHC: 33.8 g/dL (ref 30.0–36.0)
MCV: 92.9 fL (ref 80.0–100.0)
Platelets: 162 10*3/uL (ref 150–400)
RBC: 2.55 MIL/uL — ABNORMAL LOW (ref 4.22–5.81)
RDW: 14.2 % (ref 11.5–15.5)
WBC: 10.5 10*3/uL (ref 4.0–10.5)
nRBC: 0 % (ref 0.0–0.2)

## 2020-02-04 LAB — BASIC METABOLIC PANEL
Anion gap: 10 (ref 5–15)
BUN: 40 mg/dL — ABNORMAL HIGH (ref 8–23)
CO2: 19 mmol/L — ABNORMAL LOW (ref 22–32)
Calcium: 7 mg/dL — ABNORMAL LOW (ref 8.9–10.3)
Chloride: 110 mmol/L (ref 98–111)
Creatinine, Ser: 3.58 mg/dL — ABNORMAL HIGH (ref 0.61–1.24)
GFR, Estimated: 18 mL/min — ABNORMAL LOW (ref >60)
Glucose, Bld: 91 mg/dL (ref 70–99)
Potassium: 3.4 mmol/L — ABNORMAL LOW (ref 3.5–5.1)
Sodium: 139 mmol/L (ref 135–145)

## 2020-02-04 LAB — TROPONIN I (HIGH SENSITIVITY): Troponin I (High Sensitivity): 64 ng/L — ABNORMAL HIGH (ref <18)

## 2020-02-04 MED ORDER — INFLUENZA VAC A&B SA ADJ QUAD 0.5 ML IM PRSY
0.5000 mL | PREFILLED_SYRINGE | INTRAMUSCULAR | Status: AC
  Administered 2020-02-08: 0.5 mL via INTRAMUSCULAR
  Filled 2020-02-04 (×2): qty 0.5

## 2020-02-04 MED ORDER — PROMETHAZINE HCL 25 MG/ML IJ SOLN: 6.2500 mg | Freq: Four times a day (QID) | INTRAMUSCULAR | Status: DC | PRN

## 2020-02-04 MED ORDER — POTASSIUM CHLORIDE CRYS ER 20 MEQ PO TBCR
40.0000 meq | EXTENDED_RELEASE_TABLET | Freq: Once | ORAL | Status: AC
  Administered 2020-02-04: 40 meq via ORAL
  Filled 2020-02-04: qty 2

## 2020-02-04 NOTE — ED Notes (Signed)
Pt still in US

## 2020-02-04 NOTE — Progress Notes (Addendum)
PROGRESS NOTE    Casey French  CVE:[id number] DOB: [birth year] DOA: 02/03/2020 PCP: [name], MD   Chief Complaint  Patient presents with  . chest pain  . Vomiting   Brief Narrative: 70 year old male with history of hypertension, CKD stage III/IV, CAD status post stent in 2003/2017, atrial fibrillation on anticoagulation, anemia to the ED with complaint of substernal chest pain also with associated shortness of breath, nausea and few episodes of nonbloody nonbilious vomiting without abdominal pain back pain fever. He was seen in the ED blood pressure uncontrolled in 200s given IV labetalol, nephrology was consulted and was admitted.  Subjective: Seen this morning in the ED, he is alert awake oriented, he has no headache chest pain nausea.  He feels well.Blood pressure has been stable.   Assessment & Plan:  Hypertensive urgency/accelerated hypertension: Blood pressure in 220s on admission along with worsening renal failure.  Appreciate nephrology input, blood pressure 90 stabilized after adjusting patient regimen-added clonidine,  amlodipine 5 mg, metoprolol increased to 200 mg, on imdur.  Monitor blood pressure closely.  Follow-up echocardiogram, renal artery duplex.  Chest pain with mildly elevated high sensitive troponin EKG no ischemic changes likely in the setting of accelerated hypertension, cardiology on board.  Chest pain has resolved after blood pressure has improved.  Echocardiogram ordered follow-up report.  Nausea/Vomitting-from HTN- resolved.MRI brain done in ED shows "Extensive patchy T2/FLAIR signal abnormality involving the cortical and subcortical aspects of both frontal, parietal, and occipital lobes as well as the medial thalami and cerebellum.Findings are nonspecific, but favored to reflect changes of PRES."  CAD: History of ST elevation MI, stent. Continue Eliquis, Imdur, metoprolol and Crestor  Paroxysmal atrial fibrillation: In sinus rhythm, continue  Eliquis and metoprolol  AKI on CKD stage IV/metabolic acidosis with baseline creatinine 2.23 GFR 29 in October 2021, creatinine 2.6 on admission.  Likely in the setting of accelerated hypertension, optimizing blood pressure.  Monitor renal function as it it worsening further.  Nephrology on board Recent Labs  Lab 02/02/20 2240 02/04/20 0741  BUN 25* 40*  CREATININE 2.66* 3.58*   Proteinuria SPEP negative in October, serology is currently in process per nephrology  Hypokalemia mild we will replete.  Anemia of chronic kidney disease, hemoglobin downtrending monitor closely transfuse if needed.  Check anemia panel b12/foalte. Recent Labs  Lab 02/02/20 2240 02/04/20 0741  HGB 9.4* 8.0*  HCT 28.7* 23.7*   DM type 2 , hba1c 6.0 in 04/2019.sugar stable  Nutrition: Diet Order            Diet Heart Room service appropriate? Yes; Fluid consistency: Thin  Diet effective now                DVT prophylaxis: Eliquis Code Status:   Code Status: Full Code  Family Communication: plan of care discussed with patient at bedside. I CALLED AND UPDATED HIS WIFE.  Status is: Inpatient Remains inpatient appropriate because:Inpatient level of care appropriate due to severity of illness and For monitoring of patient's renal dysfunction  Dispo: The patient is from: Home              Anticipated d/c is to: Home              Anticipated d/c date is: 2 days              Patient currently is not medically stable to d/c.   Consultants:see note  Procedures:see note  Culture/Microbiology No results found for: SDES, Industry, CULT, REPTSTATUS  Other culture-see note  Medications: Scheduled Meds: . amLODipine  5 mg Oral Daily  . apixaban  5 mg Oral BID  . cloNIDine  0.1 mg Oral BID  . gabapentin  300 mg Oral QHS  . isosorbide mononitrate  60 mg Oral Daily  . metoprolol succinate  200 mg Oral Daily  . pantoprazole  40 mg Oral BID  . rosuvastatin  40 mg Oral Daily  . sodium chloride flush   3 mL Intravenous Q12H   Continuous Infusions: . sodium chloride      Antimicrobials: Anti-infectives (From admission, onward)   None     Objective: Vitals: Today's Vitals   02/04/20 0400 02/04/20 0700 02/04/20 1000 02/04/20 1300  BP: (!) 143/81 124/74 (!) 142/78 115/64  Pulse: 74 66 64 (!) 58  Resp: 19 (!) 21 (!) 21 (!) 22  Temp:      TempSrc:      SpO2: 96% 95% 94% 94%  Weight:      Height:      PainSc:       No intake or output data in the 24 hours ending 02/04/20 1310 Filed Weights   02/02/20 2236  Weight: 70.3 kg   Weight change:   Intake/Output from previous day: 01/09 0701 - 01/10 0700 In: -  Out: 725 [Urine:725] Intake/Output this shift: No intake/output data recorded.  Examination: General exam: AAOx3,NAD,weak appearing. HEENT:Oral mucosa moist, Ear/Nose WNL grossly,dentition normal. Respiratory system: bilaterally clear,no wheezing or crackles,no use of accessory muscle, non tender. Cardiovascular system: S1 & S2 +, regular, No JVD. Gastrointestinal system: Abdomen soft, NT,ND, BS+. Nervous System:Alert, awake, moving extremities and grossly nonfocal Extremities: No edema, distal peripheral pulses palpable.  Skin: No rashes,no icterus. MSK: Normal muscle bulk,tone, power  Data Reviewed: I have personally reviewed following labs and imaging studies CBC: Recent Labs  Lab 02/02/20 2240 02/04/20 0741  WBC 13.5* 10.5  HGB 9.4* 8.0*  HCT 28.7* 23.7*  MCV 95.3 92.9  PLT 233 573   Basic Metabolic Panel: Recent Labs  Lab 02/02/20 2240 02/04/20 0741  NA 141 139  K 3.6 3.4*  CL 112* 110  CO2 16* 19*  GLUCOSE 128* 91  BUN 25* 40*  CREATININE 2.66* 3.58*  CALCIUM 7.6* 7.0*   GFR: Estimated Creatinine Clearance: 19.6 mL/min (A) (by C-G formula based on SCr of 3.58 mg/dL (H)). Liver Function Tests: Recent Labs  Lab 02/02/20 2240  AST 23  ALT 13  ALKPHOS 99  BILITOT 1.1  PROT 5.9*  ALBUMIN 2.1*   Recent Labs  Lab 02/03/20 0531   LIPASE 25   No results for input(s): AMMONIA in the last 168 hours. Coagulation Profile: No results for input(s): INR, PROTIME in the last 168 hours. Cardiac Enzymes: No results for input(s): CKTOTAL, CKMB, CKMBINDEX, TROPONINI in the last 168 hours. BNP (last 3 results) No results for input(s): PROBNP in the last 8760 hours. HbA1C: No results for input(s): HGBA1C in the last 72 hours. CBG: No results for input(s): GLUCAP in the last 168 hours. Lipid Profile: No results for input(s): CHOL, HDL, LDLCALC, TRIG, CHOLHDL, LDLDIRECT in the last 72 hours. Thyroid Function Tests: No results for input(s): TSH, T4TOTAL, FREET4, T3FREE, THYROIDAB in the last 72 hours. Anemia Panel: No results for input(s): VITAMINB12, FOLATE, FERRITIN, TIBC, IRON, RETICCTPCT in the last 72 hours. Sepsis Labs: No results for input(s): PROCALCITON, LATICACIDVEN in the last 168 hours.  Recent Results (from the past 240 hour(s))  Resp Panel by RT-PCR (Flu A&B, Covid) Nasopharyngeal  Swab     Status: None   Collection Time: 02/03/20  7:36 AM   Specimen: Nasopharyngeal Swab; Nasopharyngeal(NP) swabs in vial transport medium  Result Value Ref Range Status   SARS Coronavirus 2 by RT PCR NEGATIVE NEGATIVE Final    Comment: (NOTE) SARS-CoV-2 target nucleic acids are NOT DETECTED.  The SARS-CoV-2 RNA is generally detectable in upper respiratory specimens during the acute phase of infection. The lowest concentration of SARS-CoV-2 viral copies this assay can detect is 138 copies/mL. A negative result does not preclude SARS-Cov-2 infection and should not be used as the sole basis for treatment or other patient management decisions. A negative result may occur with  improper specimen collection/handling, submission of specimen other than nasopharyngeal swab, presence of viral mutation(s) within the areas targeted by this assay, and inadequate number of viral copies(<138 copies/mL). A negative result must be combined  with clinical observations, patient history, and epidemiological information. The expected result is Negative.  Fact Sheet for Patients:  EntrepreneurPulse.com.au  Fact Sheet for Healthcare Providers:  IncredibleEmployment.be  This test is no t yet approved or cleared by the [place] FDA and  has been authorized for detection and/or diagnosis of SARS-CoV-2 by FDA under an Emergency Use Authorization (EUA). This EUA will remain  in effect (meaning this test can be used) for the duration of the COVID-19 declaration under Section 564(b)(1) of the Act, 21 U.S.C.section 360bbb-3(b)(1), unless the authorization is terminated  or revoked sooner.       Influenza A by PCR NEGATIVE NEGATIVE Final   Influenza B by PCR NEGATIVE NEGATIVE Final    Comment: (NOTE) The Xpert Xpress SARS-CoV-2/FLU/RSV plus assay is intended as an aid in the diagnosis of influenza from Nasopharyngeal swab specimens and should not be used as a sole basis for treatment. Nasal washings and aspirates are unacceptable for Xpert Xpress SARS-CoV-2/FLU/RSV testing.  Fact Sheet for Patients: EntrepreneurPulse.com.au  Fact Sheet for Healthcare Providers: IncredibleEmployment.be  This test is not yet approved or cleared by the [place] FDA and has been authorized for detection and/or diagnosis of SARS-CoV-2 by FDA under an Emergency Use Authorization (EUA). This EUA will remain in effect (meaning this test can be used) for the duration of the COVID-19 declaration under Section 564(b)(1) of the Act, 21 U.S.C. section 360bbb-3(b)(1), unless the authorization is terminated or revoked.  Performed at [facility], [address] [place], [place] [address]      Radiology Studies: CT Abdomen Pelvis Wo Contrast  Result Date: 02/03/2020 CLINICAL DATA:  Left lower quadrant pain beginning yesterday. Nausea and vomiting. EXAM: CT  ABDOMEN AND PELVIS WITHOUT CONTRAST TECHNIQUE: Multidetector CT imaging of the abdomen and pelvis was performed following the standard protocol without IV contrast. COMPARISON:  05/15/2019 FINDINGS: Lower chest: No acute findings. Hepatobiliary: No mass visualized on this unenhanced exam. Gallbladder is unremarkable. No evidence of biliary ductal dilatation. Pancreas: No mass or inflammatory process visualized on this unenhanced exam. Spleen:  Within normal limits in size. Adrenals/Urinary tract: No evidence of urolithiasis or hydronephrosis. Stable small subcapsular cyst is seen in the lateral lower pole of the right kidney. Unremarkable unopacified urinary bladder. Stomach/Bowel: No evidence of obstruction, inflammatory process, or abnormal fluid collections. Normal appendix visualized. Severe sigmoid diverticulosis and associated muscular hypertrophy is again demonstrated, however there is no evidence of acute diverticulitis. Vascular/Lymphatic: No pathologically enlarged lymph nodes identified. No evidence of abdominal aortic aneurysm. Aortic atherosclerotic calcification noted. Reproductive:  No mass or other significant abnormality. Other:  None.  Musculoskeletal: No suspicious bone lesions identified. Chronic avascular necrosis is again seen involving the left femoral head. IMPRESSION: No evidence of urolithiasis, hydronephrosis, or other acute findings. Severe sigmoid diverticulosis, without radiographic evidence of acute diverticulitis. 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 Aortic Atherosclerosis (ICD10-I70.0). Electronically Signed   By: Marlaine Hind M.D.   On: 02/03/2020 08:29   DG Chest 2 View  Result Date: 02/02/2020 CLINICAL DATA:  70 year old male with shortness of breath. EXAM: CHEST - 2 VIEW COMPARISON:  Chest radiograph dated 06/26/2018. FINDINGS: Left lung base linear atelectasis/scarring. No focal consolidation, pleural effusion or pneumothorax. The cardiac silhouette is within  limits. No acute osseous pathology. IMPRESSION: No active cardiopulmonary disease. Electronically Signed   By: Anner Crete M.D.   On: 02/02/2020 22:57   CT HEAD WO CONTRAST  Result Date: 02/03/2020 CLINICAL DATA:  Head EXAM: CT HEAD WITHOUT CONTRAST TECHNIQUE: Contiguous axial images were obtained from the base of the skull through the vertex without intravenous contrast. COMPARISON:  April 16, 2013 FINDINGS: Brain: There is hypodensity within the LEFT occipital lobe (series 3, image 13). This is increased in conspicuity in comparison to prior. No evidence of hemorrhage. Periventricular white matter hypodensities consistent with sequela of chronic microvascular ischemic disease. Global parenchymal volume loss. Remote RIGHT insular lacunar infarction versus dilated perivascular space. Vascular: Vascular calcifications of the carotid siphons. Skull: Normal. Negative for fracture or focal lesion. Sinuses/Orbits: No acute finding. Other: None. IMPRESSION: 1. Hypodensity within the LEFT occipital lobe is increased in conspicuity in comparison to prior study. This is nonspecific and could reflect age-indeterminate infarction versus artifact. Consider further evaluation with dedicated MRI if concern for infarction. 2. No evidence of hemorrhage. Electronically Signed   By: Valentino Saxon MD   On: 02/03/2020 14:10   MR BRAIN WO CONTRAST  Result Date: 02/03/2020 CLINICAL DATA:  Initial evaluation for acute stroke. EXAM: MRI HEAD WITHOUT CONTRAST TECHNIQUE: Multiplanar, multiecho pulse sequences of the brain and surrounding structures were obtained without intravenous contrast. COMPARISON:  Prior head CT from earlier the same day. FINDINGS: Brain: Cerebral volume within normal limits for age. Probable underlying patchy T2/FLAIR hyperintensity about the periventricular and deep white matter, most like related chronic microvascular ischemic disease. Suspected involvement of the pons as well. Superimposed remote  lacunar infarcts present at the right lentiform nucleus, right thalamus, and left middle cerebellar peduncle. There is fairly extensive patchy T2/FLAIR signal abnormality involving the cortical and subcortical aspects of both frontal, parietal, and occipital lobes. Patchy and fairly symmetric involvement of the medial thalami. Additional patchy multifocal involvement of the cerebellar hemispheres bilaterally. Findings are nonspecific, but favored to reflect changes of PRES. No significant mass effect, associated hemorrhage or other complication. No foci of restricted diffusion to suggest acute or subacute ischemia. Gray-white matter differentiation maintained. No other areas of remote cortical infarction. No evidence for acute or chronic intracranial hemorrhage. No mass lesion, midline shift or mass effect. No hydrocephalus or extra-axial fluid collection. Pituitary gland suprasellar region normal. Midline structures intact. Vascular: Major intracranial vascular flow voids are maintained. Skull and upper cervical spine: Craniocervical junction within normal limits. Bone marrow signal intensity normal. No scalp soft tissue abnormality. Sinuses/Orbits: Patient status post ocular lens replacement on the right. Globes and orbital soft tissues demonstrate no acute finding. Paranasal sinuses are largely clear. No mastoid effusion. Inner ear structures grossly normal. Other: None. IMPRESSION: 1. Extensive patchy T2/FLAIR signal abnormality involving the cortical and subcortical aspects of both frontal, parietal, and occipital lobes as well  as the medial thalami and cerebellum. Findings are nonspecific, but favored to reflect changes of PRES. 2. Underlying age-related cerebral atrophy with chronic microvascular ischemic disease, with multiple remote lacunar infarcts involving the right basal ganglia, right thalamus, and left middle cerebellar peduncle. Electronically Signed   By: Jeannine Boga M.D.   On: 02/03/2020  18:34   US RENAL ARTERY DUPLEX COMPLETE  Result Date: 02/04/2020 CLINICAL DATA:  Hypertensive urgency. Evaluate for renal artery stenosis. EXAM: RENAL/URINARY TRACT ULTRASOUND RENAL DUPLEX DOPPLER ULTRASOUND COMPARISON:  CT abdomen and pelvis-02/03/2020 FINDINGS: Right Kidney: Normal cortical thickness and size, measuring 11.2 cm in length, however there is diffuse increased cortical echogenicity. Note is made of an approximately 1.7 x 1.7 x 1.5 cm exophytic anechoic cyst arising from the inferior pole of the right kidney (images 31 and 32). No echogenic renal stones. No urinary obstruction. Left Kidney: Normal cortical thickness and size, measuring 11.4 cm in length, however there is diffuse increased cortical echogenicity. Note is made of an approximately 0.9 x 0.9 x 0.8 cm anechoic lesion arising from the inferior pole the left kidney, too small to adequately characterize though favored to represent a renal cyst. No echogenic renal stones. No urinary obstruction. Bladder:  Appears normal given degree of distention. _________________________________________________________ RENAL DUPLEX ULTRASOUND Right Renal Artery Velocities: Origin:  66 cm/sec Mid:  53 cm/sec Hilum:  43 cm/sec Interlobar:  21 cm/sec Arcuate:  23 cm/sec Left Renal Artery Velocities: Origin:  144 cm/sec Mid:  83 cm/sec Hilum:  73 cm/sec Interlobar:  25 cm/sec Arcuate:  22 cm/sec Aortic Velocity:  176 cm/sec Right Renal-Aortic Ratios: Origin: 0.4 Mid:  0.3 Hilum: 0.2 Interlobar: 0.1 Arcuate: 0.1 Left Renal-Aortic Ratios: Origin: 1 0.8 Mid: 0.5 Hilum: 0.2 Interlobar: 0.2 Arcuate: 0.1 Normal velocities and low resistance waveforms are demonstrated throughout the interrogated portions of the bilateral renal arteries and renal parenchyma. Bilateral renal veins appear patent where imaged. Scattered eccentric mixed echogenic plaque is seen throughout the normal caliber abdominal aorta. IMPRESSION: 1. No evidence of renal artery stenosis. 2. Bilateral  increased cortical echogenicity without evidence of atrophy, nonspecific though could be seen in the setting medical renal disease. 3. No evidence of urinary obstruction. 4.  Aortic Atherosclerosis (ICD10-I70.0). Electronically Signed   By: Sandi Mariscal M.D.   On: 02/04/2020 12:12     LOS: 1 day   Antonieta Pert, MD [facility]  02/04/2020, 1:10 PM

## 2020-02-04 NOTE — Progress Notes (Signed)
[facility]    SUBJECTIVE: The patient reports feeling well this morning with no recurrent chest discomfort and has no complaints today.   Vitals:   02/04/20 0200 02/04/20 0300 02/04/20 0400 02/04/20 0700  BP: (!) 148/89 118/74 (!) 143/81 124/74  Pulse: 69 66 74 66  Resp: (!) 25 (!) 21 19 (!) 21  Temp:      TempSrc:      SpO2: 95% 95% 96% 95%  Weight:      Height:         Intake/Output Summary (Last 24 hours) at 02/04/2020 0858 Last data filed at 02/03/2020 1014 Gross per 24 hour  Intake -  Output 725 ml  Net -725 ml      PHYSICAL EXAM  General: Well developed, well nourished, in no acute distress, lying in bed HEENT:  Normocephalic and atramatic Neck:  No JVD.  Lungs: Clear bilaterally to auscultation, normal effort of breathing on room air. Heart: HRRR . Normal S1 and S2 without gallops or murmurs.  Msk:  Back normal. Normal strength and tone for age. Extremities: No clubbing, cyanosis or edema.   Neuro: Alert and oriented X 3. Psych:  Good affect, responds appropriately   LABS: Basic Metabolic Panel: Recent Labs    02/02/20 2240  NA 141  K 3.6  CL 112*  CO2 16*  GLUCOSE 128*  BUN 25*  CREATININE 2.66*  CALCIUM 7.6*   Liver Function Tests: Recent Labs    02/02/20 2240  AST 23  ALT 13  ALKPHOS 99  BILITOT 1.1  PROT 5.9*  ALBUMIN 2.1*   Recent Labs    02/03/20 0531  LIPASE 25   CBC: Recent Labs    02/02/20 2240 02/04/20 0741  WBC 13.5* 10.5  HGB 9.4* 8.0*  HCT 28.7* 23.7*  MCV 95.3 92.9  PLT 233 162   Cardiac Enzymes: No results for input(s): CKTOTAL, CKMB, CKMBINDEX, TROPONINI in the last 72 hours. BNP: Invalid input(s): POCBNP D-Dimer: No results for input(s): DDIMER in the last 72 hours. Hemoglobin A1C: No results for input(s): HGBA1C in the last 72 hours. Fasting Lipid Panel: No results for input(s): CHOL, HDL, LDLCALC, TRIG, CHOLHDL, LDLDIRECT in the last 72 hours. Thyroid Function Tests: No results for input(s): TSH,  T4TOTAL, T3FREE, THYROIDAB in the last 72 hours.  Invalid input(s): FREET3 Anemia Panel: No results for input(s): VITAMINB12, FOLATE, FERRITIN, TIBC, IRON, RETICCTPCT in the last 72 hours.  CT Abdomen Pelvis Wo Contrast  Result Date: 02/03/2020 CLINICAL DATA:  Left lower quadrant pain beginning yesterday. Nausea and vomiting. EXAM: CT ABDOMEN AND PELVIS WITHOUT CONTRAST TECHNIQUE: Multidetector CT imaging of the abdomen and pelvis was performed following the standard protocol without IV contrast. COMPARISON:  05/15/2019 FINDINGS: Lower chest: No acute findings. Hepatobiliary: No mass visualized on this unenhanced exam. Gallbladder is unremarkable. No evidence of biliary ductal dilatation. Pancreas: No mass or inflammatory process visualized on this unenhanced exam. Spleen:  Within normal limits in size. Adrenals/Urinary tract: No evidence of urolithiasis or hydronephrosis. Stable small subcapsular cyst is seen in the lateral lower pole of the right kidney. Unremarkable unopacified urinary bladder. Stomach/Bowel: No evidence of obstruction, inflammatory process, or abnormal fluid collections. Normal appendix visualized. Severe sigmoid diverticulosis and associated muscular hypertrophy is again demonstrated, however there is no evidence of acute diverticulitis. Vascular/Lymphatic: No pathologically enlarged lymph nodes identified. No evidence of abdominal aortic aneurysm. Aortic atherosclerotic calcification noted. Reproductive:  No mass or other significant abnormality. Other:  None. Musculoskeletal: No suspicious bone lesions  identified. Chronic avascular necrosis is again seen involving the left femoral head. IMPRESSION: No evidence of urolithiasis, hydronephrosis, or other acute findings. Severe sigmoid diverticulosis, without radiographic evidence of acute diverticulitis. 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 Aortic Atherosclerosis (ICD10-I70.0). Electronically Signed   By: Marlaine Hind M.D.    On: 02/03/2020 08:29   DG Chest 2 View  Result Date: 02/02/2020 CLINICAL DATA:  70 year old male with shortness of breath. EXAM: CHEST - 2 VIEW COMPARISON:  Chest radiograph dated 06/26/2018. FINDINGS: Left lung base linear atelectasis/scarring. No focal consolidation, pleural effusion or pneumothorax. The cardiac silhouette is within limits. No acute osseous pathology. IMPRESSION: No active cardiopulmonary disease. Electronically Signed   By: Anner Crete M.D.   On: 02/02/2020 22:57   CT HEAD WO CONTRAST  Result Date: 02/03/2020 CLINICAL DATA:  Head EXAM: CT HEAD WITHOUT CONTRAST TECHNIQUE: Contiguous axial images were obtained from the base of the skull through the vertex without intravenous contrast. COMPARISON:  April 16, 2013 FINDINGS: Brain: There is hypodensity within the LEFT occipital lobe (series 3, image 13). This is increased in conspicuity in comparison to prior. No evidence of hemorrhage. Periventricular white matter hypodensities consistent with sequela of chronic microvascular ischemic disease. Global parenchymal volume loss. Remote RIGHT insular lacunar infarction versus dilated perivascular space. Vascular: Vascular calcifications of the carotid siphons. Skull: Normal. Negative for fracture or focal lesion. Sinuses/Orbits: No acute finding. Other: None. IMPRESSION: 1. Hypodensity within the LEFT occipital lobe is increased in conspicuity in comparison to prior study. This is nonspecific and could reflect age-indeterminate infarction versus artifact. Consider further evaluation with dedicated MRI if concern for infarction. 2. No evidence of hemorrhage. Electronically Signed   By: Valentino Saxon MD   On: 02/03/2020 14:10   MR BRAIN WO CONTRAST  Result Date: 02/03/2020 CLINICAL DATA:  Initial evaluation for acute stroke. EXAM: MRI HEAD WITHOUT CONTRAST TECHNIQUE: Multiplanar, multiecho pulse sequences of the brain and surrounding structures were obtained without intravenous contrast.  COMPARISON:  Prior head CT from earlier the same day. FINDINGS: Brain: Cerebral volume within normal limits for age. Probable underlying patchy T2/FLAIR hyperintensity about the periventricular and deep white matter, most like related chronic microvascular ischemic disease. Suspected involvement of the pons as well. Superimposed remote lacunar infarcts present at the right lentiform nucleus, right thalamus, and left middle cerebellar peduncle. There is fairly extensive patchy T2/FLAIR signal abnormality involving the cortical and subcortical aspects of both frontal, parietal, and occipital lobes. Patchy and fairly symmetric involvement of the medial thalami. Additional patchy multifocal involvement of the cerebellar hemispheres bilaterally. Findings are nonspecific, but favored to reflect changes of PRES. No significant mass effect, associated hemorrhage or other complication. No foci of restricted diffusion to suggest acute or subacute ischemia. Gray-white matter differentiation maintained. No other areas of remote cortical infarction. No evidence for acute or chronic intracranial hemorrhage. No mass lesion, midline shift or mass effect. No hydrocephalus or extra-axial fluid collection. Pituitary gland suprasellar region normal. Midline structures intact. Vascular: Major intracranial vascular flow voids are maintained. Skull and upper cervical spine: Craniocervical junction within normal limits. Bone marrow signal intensity normal. No scalp soft tissue abnormality. Sinuses/Orbits: Patient status post ocular lens replacement on the right. Globes and orbital soft tissues demonstrate no acute finding. Paranasal sinuses are largely clear. No mastoid effusion. Inner ear structures grossly normal. Other: None. IMPRESSION: 1. Extensive patchy T2/FLAIR signal abnormality involving the cortical and subcortical aspects of both frontal, parietal, and occipital lobes as well as the medial thalami and  cerebellum. Findings are  nonspecific, but favored to reflect changes of PRES. 2. Underlying age-related cerebral atrophy with chronic microvascular ischemic disease, with multiple remote lacunar infarcts involving the right basal ganglia, right thalamus, and left middle cerebellar peduncle. Electronically Signed   By: Jeannine Boga M.D.   On: 02/03/2020 18:34     Echo pending  TELEMETRY: sinus  ASSESSMENT AND PLAN:  Principal Problem:   Hypertensive urgency Active Problems:   AKI (acute kidney injury) ([place])   Benign essential HTN   DM type 2 (diabetes mellitus, type 2) (HCC)   CAD (coronary artery disease)   Atrial fibrillation, chronic (HCC)   Chronic kidney disease   1.  Chest pain, mild in nature, mildly elevated high-sensitivity troponin, nondiagnostic ECG, likely not due to to acute coronary syndrome, more likely due to hypertensive  urgency with systolic blood pressures greater than 220. Patient denies recurrent chest pain.  2.  Coronary artery disease, history of STEMI and Cypher stent RCA 11/22/2001, Xience Alpine stent mid LAD 07/21/2015 3.  Hypertensive urgency, medications include metoprolol succinate, lisinopril, HCTZ, blood pressure mildly improved after labetalol 4.  Acute kidney injury, with underlying chronic kidney disease, BUN and creatinine 28 and 2.66, respectively 5.  Paroxysmal atrial fibrillation, currently in sinus rhythm, on Eliquis for stroke prevention  Recommendations: 1. Continue isosorbide mononitrate 60 mg, metoprolol succinate 200 mg, and amlodipine 5 mg daily 2. Continue aspirin and statin 3. Continue Eliquis for stroke prevention 4. Review 2D echocardiogram and renal ultrasound 5. Recommend discharge today and follow-up with [name] in 1 week   Clabe Seal, PA-C 02/04/2020 8:58 AM

## 2020-02-04 NOTE — Progress Notes (Signed)
[place], [place] 02/04/20  Subjective:   LOS: 1 01/09 0701 - 01/10 0700 In: -  Out: 725 [Urine:725]  Patient known to our practice from outpatient follow-up.  He was last seen by Dr. Juleen China in October 2021.  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  He has underlying problems of hypertension, diabetes and nonsteroidal use.  He was started on torsemide in October but not sure if he is taking it.  He presents to the emergency room today for chest pain and elevated blood pressure.  He also reported vomiting and feeling pale.  Initial blood pressure of 221/104.  Blood sugar of 146.  Patient was given IV labetalol with some improvement in blood pressure of 180/96.  Patient is also noted to have increased creatinine of 2.66.  His baseline appears to be 2.23 from November 15, 2019.  Outpatient urine protein to creatinine ratio of 15 g.  Hemoglobin A1c of 6.2%.  Today, patient overall feels better Blood pressure control has improved Serum creatinine however is higher  Objective:  Vital signs in last 24 hours:  Temp:  [98.1 F (36.7 C)] 98.1 F (36.7 C) (01/09 2200) Pulse Rate:  [60-99] 64 (01/10 1000) Resp:  [16-30] 21 (01/10 1000) BP: (118-228)/(68-125) 142/78 (01/10 1000) SpO2:  [93 %-99 %] 94 % (01/10 1000)  Weight change:  Filed Weights   02/02/20 2236  Weight: 70.3 kg    Intake/Output:   No intake or output data in the 24 hours ending 02/04/20 1207  Physical Exam: General:  No acute distress, laying in the bed  HEENT  anicteric, moist oral mucous membrane  Pulm/lungs  normal breathing effort,  Mild diffuse creackles  CVS/Heart  irregular rhythm, no rub or gallop  Abdomen:   Soft, nontender  Extremities:  No peripheral edema  Neurologic:  Alert, oriented, able to follow commands  Skin:  No acute rashes    Basic Metabolic Panel:  Recent Labs  Lab 02/02/20 2240 02/04/20 0741  NA 141 139  K 3.6 3.4*  CL 112*  110  CO2 16* 19*  GLUCOSE 128* 91  BUN 25* 40*  CREATININE 2.66* 3.58*  CALCIUM 7.6* 7.0*     CBC: Recent Labs  Lab 02/02/20 2240 02/04/20 0741  WBC 13.5* 10.5  HGB 9.4* 8.0*  HCT 28.7* 23.7*  MCV 95.3 92.9  PLT 233 162     No results found for: HEPBSAG, HEPBSAB, HEPBIGM    Microbiology:  Recent Results (from the past 240 hour(s))  Resp Panel by RT-PCR (Flu A&B, Covid) Nasopharyngeal Swab     Status: None   Collection Time: 02/03/20  7:36 AM   Specimen: Nasopharyngeal Swab; Nasopharyngeal(NP) swabs in vial transport medium  Result Value Ref Range Status   SARS Coronavirus 2 by RT PCR NEGATIVE NEGATIVE Final    Comment: (NOTE) SARS-CoV-2 target nucleic acids are NOT DETECTED.  The SARS-CoV-2 RNA is generally detectable in upper respiratory specimens during the acute phase of infection. The lowest concentration of SARS-CoV-2 viral copies this assay can detect is 138 copies/mL. A negative result does not preclude SARS-Cov-2 infection and should not be used as the sole basis for treatment or other patient management decisions. A negative result may occur with  improper specimen collection/handling, submission of specimen other than nasopharyngeal swab, presence of viral mutation(s) within the areas targeted by this assay, and inadequate number of viral copies(<138 copies/mL). A negative result must be combined with clinical observations, patient history, and  epidemiological information. The expected result is Negative.  Fact Sheet for Patients:  EntrepreneurPulse.com.au  Fact Sheet for Healthcare Providers:  IncredibleEmployment.be  This test is no t yet approved or cleared by the [place] FDA and  has been authorized for detection and/or diagnosis of SARS-CoV-2 by FDA under an Emergency Use Authorization (EUA). This EUA will remain  in effect (meaning this test can be used) for the duration of the COVID-19 declaration  under Section 564(b)(1) of the Act, 21 U.S.C.section 360bbb-3(b)(1), unless the authorization is terminated  or revoked sooner.       Influenza A by PCR NEGATIVE NEGATIVE Final   Influenza B by PCR NEGATIVE NEGATIVE Final    Comment: (NOTE) The Xpert Xpress SARS-CoV-2/FLU/RSV plus assay is intended as an aid in the diagnosis of influenza from Nasopharyngeal swab specimens and should not be used as a sole basis for treatment. Nasal washings and aspirates are unacceptable for Xpert Xpress SARS-CoV-2/FLU/RSV testing.  Fact Sheet for Patients: EntrepreneurPulse.com.au  Fact Sheet for Healthcare Providers: IncredibleEmployment.be  This test is not yet approved or cleared by the [place] FDA and has been authorized for detection and/or diagnosis of SARS-CoV-2 by FDA under an Emergency Use Authorization (EUA). This EUA will remain in effect (meaning this test can be used) for the duration of the COVID-19 declaration under Section 564(b)(1) of the Act, 21 U.S.C. section 360bbb-3(b)(1), unless the authorization is terminated or revoked.  Performed at [facility], [address] [place], [place] [address]     Coagulation Studies: No results for input(s): LABPROT, INR in the last 72 hours.  Urinalysis: No results for input(s): COLORURINE, LABSPEC, PHURINE, GLUCOSEU, HGBUR, BILIRUBINUR, KETONESUR, PROTEINUR, UROBILINOGEN, NITRITE, LEUKOCYTESUR in the last 72 hours.  Invalid input(s): APPERANCEUR    Imaging: CT Abdomen Pelvis Wo Contrast  Result Date: 02/03/2020 CLINICAL DATA:  Left lower quadrant pain beginning yesterday. Nausea and vomiting. EXAM: CT ABDOMEN AND PELVIS WITHOUT CONTRAST TECHNIQUE: Multidetector CT imaging of the abdomen and pelvis was performed following the standard protocol without IV contrast. COMPARISON:  05/15/2019 FINDINGS: Lower chest: No acute findings. Hepatobiliary: No mass visualized on this unenhanced  exam. Gallbladder is unremarkable. No evidence of biliary ductal dilatation. Pancreas: No mass or inflammatory process visualized on this unenhanced exam. Spleen:  Within normal limits in size. Adrenals/Urinary tract: No evidence of urolithiasis or hydronephrosis. Stable small subcapsular cyst is seen in the lateral lower pole of the right kidney. Unremarkable unopacified urinary bladder. Stomach/Bowel: No evidence of obstruction, inflammatory process, or abnormal fluid collections. Normal appendix visualized. Severe sigmoid diverticulosis and associated muscular hypertrophy is again demonstrated, however there is no evidence of acute diverticulitis. Vascular/Lymphatic: No pathologically enlarged lymph nodes identified. No evidence of abdominal aortic aneurysm. Aortic atherosclerotic calcification noted. Reproductive:  No mass or other significant abnormality. Other:  None. Musculoskeletal: No suspicious bone lesions identified. Chronic avascular necrosis is again seen involving the left femoral head. IMPRESSION: No evidence of urolithiasis, hydronephrosis, or other acute findings. Severe sigmoid diverticulosis, without radiographic evidence of acute diverticulitis. 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 Aortic Atherosclerosis (ICD10-I70.0). Electronically Signed   By: Marlaine Hind M.D.   On: 02/03/2020 08:29   DG Chest 2 View  Result Date: 02/02/2020 CLINICAL DATA:  70 year old male with shortness of breath. EXAM: CHEST - 2 VIEW COMPARISON:  Chest radiograph dated 06/26/2018. FINDINGS: Left lung base linear atelectasis/scarring. No focal consolidation, pleural effusion or pneumothorax. The cardiac silhouette is within limits. No acute osseous pathology. IMPRESSION: No active cardiopulmonary  disease. Electronically Signed   By: Anner Crete M.D.   On: 02/02/2020 22:57   CT HEAD WO CONTRAST  Result Date: 02/03/2020 CLINICAL DATA:  Head EXAM: CT HEAD WITHOUT CONTRAST TECHNIQUE: Contiguous axial  images were obtained from the base of the skull through the vertex without intravenous contrast. COMPARISON:  April 16, 2013 FINDINGS: Brain: There is hypodensity within the LEFT occipital lobe (series 3, image 13). This is increased in conspicuity in comparison to prior. No evidence of hemorrhage. Periventricular white matter hypodensities consistent with sequela of chronic microvascular ischemic disease. Global parenchymal volume loss. Remote RIGHT insular lacunar infarction versus dilated perivascular space. Vascular: Vascular calcifications of the carotid siphons. Skull: Normal. Negative for fracture or focal lesion. Sinuses/Orbits: No acute finding. Other: None. IMPRESSION: 1. Hypodensity within the LEFT occipital lobe is increased in conspicuity in comparison to prior study. This is nonspecific and could reflect age-indeterminate infarction versus artifact. Consider further evaluation with dedicated MRI if concern for infarction. 2. No evidence of hemorrhage. Electronically Signed   By: Valentino Saxon MD   On: 02/03/2020 14:10   MR BRAIN WO CONTRAST  Result Date: 02/03/2020 CLINICAL DATA:  Initial evaluation for acute stroke. EXAM: MRI HEAD WITHOUT CONTRAST TECHNIQUE: Multiplanar, multiecho pulse sequences of the brain and surrounding structures were obtained without intravenous contrast. COMPARISON:  Prior head CT from earlier the same day. FINDINGS: Brain: Cerebral volume within normal limits for age. Probable underlying patchy T2/FLAIR hyperintensity about the periventricular and deep white matter, most like related chronic microvascular ischemic disease. Suspected involvement of the pons as well. Superimposed remote lacunar infarcts present at the right lentiform nucleus, right thalamus, and left middle cerebellar peduncle. There is fairly extensive patchy T2/FLAIR signal abnormality involving the cortical and subcortical aspects of both frontal, parietal, and occipital lobes. Patchy and fairly  symmetric involvement of the medial thalami. Additional patchy multifocal involvement of the cerebellar hemispheres bilaterally. Findings are nonspecific, but favored to reflect changes of PRES. No significant mass effect, associated hemorrhage or other complication. No foci of restricted diffusion to suggest acute or subacute ischemia. Gray-white matter differentiation maintained. No other areas of remote cortical infarction. No evidence for acute or chronic intracranial hemorrhage. No mass lesion, midline shift or mass effect. No hydrocephalus or extra-axial fluid collection. Pituitary gland suprasellar region normal. Midline structures intact. Vascular: Major intracranial vascular flow voids are maintained. Skull and upper cervical spine: Craniocervical junction within normal limits. Bone marrow signal intensity normal. No scalp soft tissue abnormality. Sinuses/Orbits: Patient status post ocular lens replacement on the right. Globes and orbital soft tissues demonstrate no acute finding. Paranasal sinuses are largely clear. No mastoid effusion. Inner ear structures grossly normal. Other: None. IMPRESSION: 1. Extensive patchy T2/FLAIR signal abnormality involving the cortical and subcortical aspects of both frontal, parietal, and occipital lobes as well as the medial thalami and cerebellum. Findings are nonspecific, but favored to reflect changes of PRES. 2. Underlying age-related cerebral atrophy with chronic microvascular ischemic disease, with multiple remote lacunar infarcts involving the right basal ganglia, right thalamus, and left middle cerebellar peduncle. Electronically Signed   By: Jeannine Boga M.D.   On: 02/03/2020 18:34     Medications:    Scheduled Meds: . amLODipine  5 mg Oral Daily  . apixaban  5 mg Oral BID  . cloNIDine  0.1 mg Oral BID  . gabapentin  300 mg Oral QHS  . isosorbide mononitrate  60 mg Oral Daily  . metoprolol succinate  200 mg Oral Daily  .  pantoprazole  40 mg  Oral BID  . rosuvastatin  40 mg Oral Daily  . sodium chloride flush  3 mL Intravenous Q12H   Continuous Infusions: . sodium chloride     PRN Meds:.sodium chloride, acetaminophen, labetalol, ondansetron (ZOFRAN) IV, oxyCODONE, sodium chloride flush    Assessment/ Plan:  70 y.o. male with coronary artery disease, history of STEMI with stent in October 2003, PCI 2017.  Atrial fibrillation, diabetes, gout, chronic kidney disease  Principal Problem:   Hypertensive urgency Active Problems:   AKI (acute kidney injury) ([place])   Benign essential HTN   DM type 2 (diabetes mellitus, type 2) (HCC)   CAD (coronary artery disease)   Atrial fibrillation, chronic ([place])   Chronic kidney disease   #.  Acute kidney injury with CKD st 4 with baseline creatinine of 2.23, GFR 29 in October 2021.  Presenting creatinine of 2.66 Recent Labs    02/02/20 2240 02/04/20 0741  CREATININE 2.66* 3.58*  AKI likely secondary to accelerated hypertension Agree with restarting home medications Blood pressure control has improved overall   #Proteinuria Urine protein to creatinine ratio 15 g in October 2021 Serum kappa lambda ratio of 1.35, SPEP negative in October 2021 Screening serologies ordered  #. Anemia of CKD  Lab Results  Component Value Date   HGB 8.0 (L) 02/04/2020    #. HTN with CKD Current regimen includes amlodipine 5 mg daily Clonidine 0.1 mg twice a day Isosorbide 60 mg daily Metoprolol 200 mg daily  #. Diabetes type 2 with CKD Hemoglobin A1C (%)  Date Value  09/27/2012 6.4 (H)   Hgb A1c MFr Bld (%)  Date Value  05/15/2019 6.0 (H)  Rosuvastatin 40 mg daily for cardiovascular risk reduction   LOS: [place] 1/10/202212:07 [place], [place]

## 2020-02-04 NOTE — Progress Notes (Signed)
*  PRELIMINARY RESULTS* Echocardiogram 2D Echocardiogram has been performed.  Casey French 02/04/2020, 2:19 PM

## 2020-02-04 NOTE — ED Notes (Signed)
Pt back from [facility]

## 2020-02-04 NOTE — ED Notes (Signed)
Pt to go to [place] in approx 15 minutes. Pt updated and continued to be kept NPO

## 2020-02-05 DIAGNOSIS — N179 Acute kidney failure, unspecified: Principal | ICD-10-CM

## 2020-02-05 DIAGNOSIS — I482 Chronic atrial fibrillation, unspecified: Secondary | ICD-10-CM

## 2020-02-05 DIAGNOSIS — I160 Hypertensive urgency: Secondary | ICD-10-CM

## 2020-02-05 LAB — BASIC METABOLIC PANEL
Anion gap: 13 (ref 5–15)
BUN: 48 mg/dL — ABNORMAL HIGH (ref 8–23)
CO2: 16 mmol/L — ABNORMAL LOW (ref 22–32)
Calcium: 7.4 mg/dL — ABNORMAL LOW (ref 8.9–10.3)
Chloride: 109 mmol/L (ref 98–111)
Creatinine, Ser: 4.02 mg/dL — ABNORMAL HIGH (ref 0.61–1.24)
GFR, Estimated: 15 mL/min — ABNORMAL LOW (ref >60)
Glucose, Bld: 71 mg/dL (ref 70–99)
Potassium: 3.5 mmol/L (ref 3.5–5.1)
Sodium: 138 mmol/L (ref 135–145)

## 2020-02-05 LAB — CBC
HCT: 24.6 % — ABNORMAL LOW (ref 39.0–52.0)
Hemoglobin: 8.2 g/dL — ABNORMAL LOW (ref 13.0–17.0)
MCH: 31.8 pg (ref 26.0–34.0)
MCHC: 33.3 g/dL (ref 30.0–36.0)
MCV: 95.3 fL (ref 80.0–100.0)
Platelets: 163 10*3/uL (ref 150–400)
RBC: 2.58 MIL/uL — ABNORMAL LOW (ref 4.22–5.81)
RDW: 14.3 % (ref 11.5–15.5)
WBC: 10.1 10*3/uL (ref 4.0–10.5)
nRBC: 0 % (ref 0.0–0.2)

## 2020-02-05 LAB — ECHOCARDIOGRAM COMPLETE
AR max vel: 2.88 cm2
AV Area VTI: 2.97 cm2
AV Area mean vel: 2.6 cm2
AV Mean grad: 7 mmHg
AV Peak grad: 10.1 mmHg
Ao pk vel: 1.59 m/s
Area-P 1/2: 3.28 cm2
S' Lateral: 2.57 cm

## 2020-02-05 LAB — MISC LABCORP TEST (SEND OUT): Labcorp test code: 141330

## 2020-02-05 LAB — C4 COMPLEMENT: Complement C4, Body Fluid: 17 mg/dL (ref 12–38)

## 2020-02-05 LAB — ANA W/REFLEX IF POSITIVE: Anti Nuclear Antibody (ANA): NEGATIVE

## 2020-02-05 LAB — C3 COMPLEMENT: C3 Complement: 106 mg/dL (ref 82–167)

## 2020-02-05 MED ORDER — ROSUVASTATIN CALCIUM 10 MG PO TABS
10.0000 mg | ORAL_TABLET | Freq: Every day | ORAL | Status: DC
  Administered 2020-02-06 – 2020-02-08 (×2): 10 mg via ORAL
  Filled 2020-02-05 (×2): qty 1

## 2020-02-05 MED ORDER — BOOST / RESOURCE BREEZE PO LIQD CUSTOM
1.0000 | Freq: Three times a day (TID) | ORAL | Status: DC
  Administered 2020-02-05 – 2020-02-07 (×5): 1 via ORAL

## 2020-02-05 NOTE — Progress Notes (Signed)
Initial Nutrition Assessment  DOCUMENTATION CODES:   Not applicable  INTERVENTION:  Boost Breeze po TID, each supplement provides 250 kcal and 9 grams of protein  Advance diet as medically feasible   NUTRITION DIAGNOSIS:   Inadequate oral intake related to vomiting,nausea as evidenced by per patient/family report (CLD insufficient to meet estimated needs).   GOAL:   Patient will meet greater than or equal to 90% of their needs    MONITOR:   PO intake,Supplement acceptance,Weight trends,Labs,I & O's,Diet advancement,Skin  REASON FOR ASSESSMENT:   Malnutrition Screening Tool    ASSESSMENT:  70 year old male admitted for hypertensive urgency presented with moderate substernal chest pain associated with mild SOB, nausea with a few episodes of small volume emesis and loose stools. Past medical history significant of chronic hemorrhoids, HTN, CKD stage III/IV, CAD s/p stents, atrial fibrillation, anemia, DM, diverticulosis, GERD, Gout, HLD, and depression.  RD working remotely.  Able to speak with pt via phone this afternoon, he reports feeling some better, denies having nausea or vomiting today. He recalls 100% po of breakfast (chicken broth, jello, grape juice) this morning. Pt says he is hungry, ready to drink his clear liquid lunch that arrived during conversation. Patient endorses good appetite at baseline, says he eats well at home and recalls usual weight around 155-160 lbs. Patient currently weighs 70.3 kg (161.7 lbs) and per chart, weights have been stable over the last 4 years. Currently on clears which is insufficient to meet estimated needs. Will monitor for diet advancement and will order Boost Breeze to aid with meeting needs.   Per notes: -baseline Cr 2.23, trending up daily - nephro following -hx proteinuria, SPEP negative in Oct; serology currently in process -well controlled DM; not on meds at home  Medications reviewed and include: Eliquis, Gabapentin,  Protonix  Labs: Glucose 71,91, BUN 48 (H), Cr 4.02 (H), Hgb 8.2 (L), HCT 24.6 (L) A1c 6.0 on 05/15/19 (well controlled)   NUTRITION - FOCUSED PHYSICAL EXAM: Unable to complete at this time   Diet Order:   Diet Order            Diet clear liquid Room service appropriate? Yes; Fluid consistency: Thin  Diet effective now                 EDUCATION NEEDS:   No education needs have been identified at this time  Skin:  Skin Assessment: Reviewed RN Assessment  Last BM:  1/10  Height:   Ht Readings from Last 1 Encounters:  02/02/20 5\' 9"  (1.753 m)    Weight:   Wt Readings from Last 1 Encounters:  02/02/20 70.3 kg    BMI:  Body mass index is 22.89 kg/m.  Estimated Nutritional Needs:   Kcal:  1900-2100  Protein:  77-84  Fluid:  >/=1.9 L    Lajuan Lines, RD, LDN Clinical Nutrition After Hours/Weekend Pager # [phone]

## 2020-02-05 NOTE — Consult Note (Signed)
Chief Complaint: Patient was seen in consultation today for  Chief Complaint  Patient presents with  . chest pain  . Vomiting    Referring Physician(s): Dr. Candiss Norse  Supervising Physician: Daryll Brod  Patient Status: [place] - In-pt  History of Present Illness: Casey French is a 70 y.o. male with a medical history significant for HTN, CKD stage IIIb, proteinuria, CAD s/p stents in 2003/2017 and atrial fibrillation on Eliquis. He presented to the [facility] ED 02/03/20 for chest pain, elevated blood pressure, vomiting and weakness. Blood pressure on admission was 221/104. Lab work was positive for increased creatinine levels (2.66; baseline 2.23). The creatinine level continues to rise and is now 4.02.   Interventional Radiology has been asked to evaluate this patient for a non-focal renal biopsy for further work up.   Past Medical History:  Diagnosis Date  . Anemia   . Anginal pain ([place])   . Atrial fibrillation ([place])   . Chronic kidney disease   . Coronary artery disease   . Depression   . Diabetes mellitus without complication ([place])   . Diverticulosis   . GERD (gastroesophageal reflux disease)   . Gout   . Heart murmur   . High cholesterol   . Hypertension   . Myocardial infarction [facility])     Past Surgical History:  Procedure Laterality Date  . CARDIAC CATHETERIZATION    . CATARACT EXTRACTION W/PHACO Right 07/05/2019   Procedure: CATARACT EXTRACTION PHACO AND INTRAOCULAR LENS PLACEMENT ([place]) RIGHT DIABETIC;  Surgeon: Birder Robson, MD;  Location: [facility] ORS;  Service: Ophthalmology;  Laterality: Right;  US00:59.3 CDE8.41 LOT [id number]  . COLONOSCOPY WITH PROPOFOL N/A 04/01/2017   Procedure: COLONOSCOPY WITH PROPOFOL;  Surgeon: Lollie Sails, MD;  Location: [facility] ENDOSCOPY;  Service: Endoscopy;  Laterality: N/A;  . CORONARY ANGIOPLASTY    . right hand surgery     3 screws in right hand    Allergies: Morphine and Morphine and related  Medications: Prior to  Admission medications   Medication Sig Start Date End Date Taking? Authorizing Provider  allopurinol (ZYLOPRIM) 100 MG tablet Take 100 mg by mouth daily.   Yes [provider]  apixaban (ELIQUIS) 5 MG TABS tablet Take 5 mg by mouth 2 (two) times daily.   Yes [provider]  aspirin 81 MG EC tablet Take 81 mg by mouth daily.   Yes [provider]  fluticasone (FLONASE) 50 MCG/ACT nasal spray Place 2 sprays into both nostrils daily. 04/30/14  Yes [provider]  gabapentin (NEURONTIN) 300 MG capsule Take 300 mg by mouth at bedtime. 09/25/16  Yes [provider]  isosorbide mononitrate (IMDUR) 60 MG 24 hr tablet Take 60 mg by mouth daily.   Yes [provider]  lisinopril (ZESTRIL) 20 MG tablet Take 20 mg by mouth daily.   Yes [provider]  metoprolol succinate (TOPROL-XL) 100 MG 24 hr tablet Take 150 mg by mouth daily.   Yes [provider]  nitroGLYCERIN (NITROSTAT) 0.4 MG SL tablet Place 0.4 mg under the tongue every 5 (five) minutes as needed for chest pain.   Yes [provider]  omeprazole (PRILOSEC) 20 MG capsule Take 20 mg by mouth daily.   Yes [provider]  psyllium (METAMUCIL) 58.6 % packet Take 1 packet by mouth 2 (two) times daily.   Yes [provider]  rosuvastatin (CRESTOR) 40 MG tablet Take 40 mg by mouth daily. 03/06/19  Yes [provider]  torsemide (DEMADEX) 10  MG tablet Take 10 mg by mouth daily. 11/15/19  Yes [provider]     Family History  Problem Relation Age of Onset  . CAD Other     Social History   Socioeconomic History  . Marital status: Married    Spouse name: Not on file  . Number of children: Not on file  . Years of education: Not on file  . Highest education level: Not on file  Occupational History  . Not on file  Tobacco Use  . Smoking status: Former Smoker    Quit date: 03/24/2001    Years since quitting: 18.8  . Smokeless  tobacco: Never Used  Vaping Use  . Vaping Use: Never used  Substance and Sexual Activity  . Alcohol use: No    Alcohol/week: 0.0 standard drinks  . Drug use: No  . Sexual activity: Not on file  Other Topics Concern  . Not on file  Social History Narrative  . Not on file   Social Determinants of Health   Financial Resource Strain: Not on file  Food Insecurity: Not on file  Transportation Needs: Not on file  Physical Activity: Not on file  Stress: Not on file  Social Connections: Not on file    Review of Systems: A 12 point ROS discussed and pertinent positives are indicated in the HPI above.  All other systems are negative.  Review of Systems  Constitutional: Positive for appetite change and fatigue.  Respiratory: Negative for cough and shortness of breath.   Cardiovascular: Negative for chest pain and leg swelling.  Gastrointestinal: Negative for abdominal pain, diarrhea, nausea and vomiting.  Genitourinary: Positive for flank pain.  Musculoskeletal: Positive for back pain.  Neurological: Negative for headaches.  Hematological: Does not bruise/bleed easily.    Vital Signs: BP 118/69 (BP Location: Left Arm)   Pulse (!) 58   Temp 98 F (36.7 C)   Resp (!) 24   Ht 5\' 9"  (1.753 m)   Wt 155 lb (70.3 kg)   SpO2 97%   BMI 22.89 kg/m   Physical Exam Constitutional:      General: He is not in acute distress. HENT:     Mouth/Throat:     Mouth: Mucous membranes are moist.     Pharynx: Oropharynx is clear.  Cardiovascular:     Rate and Rhythm: Normal rate and regular rhythm.     Pulses: Normal pulses.     Heart sounds: Normal heart sounds.  Pulmonary:     Effort: Pulmonary effort is normal.     Breath sounds: Normal breath sounds.  Abdominal:     General: Bowel sounds are normal.     Palpations: Abdomen is soft.  Musculoskeletal:        General: Normal range of motion.     Cervical back: Normal range of motion.  Skin:    General: Skin is warm and dry.   Neurological:     Mental Status: He is alert and oriented to person, place, and time.     Imaging: CT Abdomen Pelvis Wo Contrast  Result Date: 02/03/2020 CLINICAL DATA:  Left lower quadrant pain beginning yesterday. Nausea and vomiting. EXAM: CT ABDOMEN AND PELVIS WITHOUT CONTRAST TECHNIQUE: Multidetector CT imaging of the abdomen and pelvis was performed following the standard protocol without IV contrast. COMPARISON:  05/15/2019 FINDINGS: Lower chest: No acute findings. Hepatobiliary: No mass visualized on this unenhanced exam. Gallbladder is unremarkable. No evidence of biliary ductal dilatation. Pancreas: No mass or inflammatory process  visualized on this unenhanced exam. Spleen:  Within normal limits in size. Adrenals/Urinary tract: No evidence of urolithiasis or hydronephrosis. Stable small subcapsular cyst is seen in the lateral lower pole of the right kidney. Unremarkable unopacified urinary bladder. Stomach/Bowel: No evidence of obstruction, inflammatory process, or abnormal fluid collections. Normal appendix visualized. Severe sigmoid diverticulosis and associated muscular hypertrophy is again demonstrated, however there is no evidence of acute diverticulitis. Vascular/Lymphatic: No pathologically enlarged lymph nodes identified. No evidence of abdominal aortic aneurysm. Aortic atherosclerotic calcification noted. Reproductive:  No mass or other significant abnormality. Other:  None. Musculoskeletal: No suspicious bone lesions identified. Chronic avascular necrosis is again seen involving the left femoral head. IMPRESSION: No evidence of urolithiasis, hydronephrosis, or other acute findings. Severe sigmoid diverticulosis, without radiographic evidence of acute diverticulitis. 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 Aortic Atherosclerosis (ICD10-I70.0). Electronically Signed   By: Marlaine Hind M.D.   On: 02/03/2020 08:29   DG Chest 2 View  Result Date: 02/02/2020 CLINICAL DATA:   70 year old male with shortness of breath. EXAM: CHEST - 2 VIEW COMPARISON:  Chest radiograph dated 06/26/2018. FINDINGS: Left lung base linear atelectasis/scarring. No focal consolidation, pleural effusion or pneumothorax. The cardiac silhouette is within limits. No acute osseous pathology. IMPRESSION: No active cardiopulmonary disease. Electronically Signed   By: Anner Crete M.D.   On: 02/02/2020 22:57   CT HEAD WO CONTRAST  Result Date: 02/03/2020 CLINICAL DATA:  Head EXAM: CT HEAD WITHOUT CONTRAST TECHNIQUE: Contiguous axial images were obtained from the base of the skull through the vertex without intravenous contrast. COMPARISON:  April 16, 2013 FINDINGS: Brain: There is hypodensity within the LEFT occipital lobe (series 3, image 13). This is increased in conspicuity in comparison to prior. No evidence of hemorrhage. Periventricular white matter hypodensities consistent with sequela of chronic microvascular ischemic disease. Global parenchymal volume loss. Remote RIGHT insular lacunar infarction versus dilated perivascular space. Vascular: Vascular calcifications of the carotid siphons. Skull: Normal. Negative for fracture or focal lesion. Sinuses/Orbits: No acute finding. Other: None. IMPRESSION: 1. Hypodensity within the LEFT occipital lobe is increased in conspicuity in comparison to prior study. This is nonspecific and could reflect age-indeterminate infarction versus artifact. Consider further evaluation with dedicated MRI if concern for infarction. 2. No evidence of hemorrhage. Electronically Signed   By: Valentino Saxon MD   On: 02/03/2020 14:10   MR BRAIN WO CONTRAST  Result Date: 02/03/2020 CLINICAL DATA:  Initial evaluation for acute stroke. EXAM: MRI HEAD WITHOUT CONTRAST TECHNIQUE: Multiplanar, multiecho pulse sequences of the brain and surrounding structures were obtained without intravenous contrast. COMPARISON:  Prior head CT from earlier the same day. FINDINGS: Brain: Cerebral  volume within normal limits for age. Probable underlying patchy T2/FLAIR hyperintensity about the periventricular and deep white matter, most like related chronic microvascular ischemic disease. Suspected involvement of the pons as well. Superimposed remote lacunar infarcts present at the right lentiform nucleus, right thalamus, and left middle cerebellar peduncle. There is fairly extensive patchy T2/FLAIR signal abnormality involving the cortical and subcortical aspects of both frontal, parietal, and occipital lobes. Patchy and fairly symmetric involvement of the medial thalami. Additional patchy multifocal involvement of the cerebellar hemispheres bilaterally. Findings are nonspecific, but favored to reflect changes of PRES. No significant mass effect, associated hemorrhage or other complication. No foci of restricted diffusion to suggest acute or subacute ischemia. Gray-white matter differentiation maintained. No other areas of remote cortical infarction. No evidence for acute or chronic intracranial hemorrhage. No mass lesion, midline shift or mass  effect. No hydrocephalus or extra-axial fluid collection. Pituitary gland suprasellar region normal. Midline structures intact. Vascular: Major intracranial vascular flow voids are maintained. Skull and upper cervical spine: Craniocervical junction within normal limits. Bone marrow signal intensity normal. No scalp soft tissue abnormality. Sinuses/Orbits: Patient status post ocular lens replacement on the right. Globes and orbital soft tissues demonstrate no acute finding. Paranasal sinuses are largely clear. No mastoid effusion. Inner ear structures grossly normal. Other: None. IMPRESSION: 1. Extensive patchy T2/FLAIR signal abnormality involving the cortical and subcortical aspects of both frontal, parietal, and occipital lobes as well as the medial thalami and cerebellum. Findings are nonspecific, but favored to reflect changes of PRES. 2. Underlying age-related  cerebral atrophy with chronic microvascular ischemic disease, with multiple remote lacunar infarcts involving the right basal ganglia, right thalamus, and left middle cerebellar peduncle. Electronically Signed   By: Jeannine Boga M.D.   On: 02/03/2020 18:34   US RENAL ARTERY DUPLEX COMPLETE  Result Date: 02/04/2020 CLINICAL DATA:  Hypertensive urgency. Evaluate for renal artery stenosis. EXAM: RENAL/URINARY TRACT ULTRASOUND RENAL DUPLEX DOPPLER ULTRASOUND COMPARISON:  CT abdomen and pelvis-02/03/2020 FINDINGS: Right Kidney: Normal cortical thickness and size, measuring 11.2 cm in length, however there is diffuse increased cortical echogenicity. Note is made of an approximately 1.7 x 1.7 x 1.5 cm exophytic anechoic cyst arising from the inferior pole of the right kidney (images 31 and 32). No echogenic renal stones. No urinary obstruction. Left Kidney: Normal cortical thickness and size, measuring 11.4 cm in length, however there is diffuse increased cortical echogenicity. Note is made of an approximately 0.9 x 0.9 x 0.8 cm anechoic lesion arising from the inferior pole the left kidney, too small to adequately characterize though favored to represent a renal cyst. No echogenic renal stones. No urinary obstruction. Bladder:  Appears normal given degree of distention. _________________________________________________________ RENAL DUPLEX ULTRASOUND Right Renal Artery Velocities: Origin:  66 cm/sec Mid:  53 cm/sec [place]:  43 cm/sec Interlobar:  21 cm/sec Arcuate:  23 cm/sec Left Renal Artery Velocities: Origin:  144 cm/sec Mid:  83 cm/sec Hilum:  73 cm/sec Interlobar:  25 cm/sec Arcuate:  22 cm/sec Aortic Velocity:  176 cm/sec Right Renal-Aortic Ratios: Origin: 0.4 Mid:  0.3 Hilum: 0.2 Interlobar: 0.1 Arcuate: 0.1 Left Renal-Aortic Ratios: Origin: 1 0.8 Mid: 0.5 Hilum: 0.2 Interlobar: 0.2 Arcuate: 0.1 Normal velocities and low resistance waveforms are demonstrated throughout the interrogated portions of the  bilateral renal arteries and renal parenchyma. Bilateral renal veins appear patent where imaged. Scattered eccentric mixed echogenic plaque is seen throughout the normal caliber abdominal aorta. IMPRESSION: 1. No evidence of renal artery stenosis. 2. Bilateral increased cortical echogenicity without evidence of atrophy, nonspecific though could be seen in the setting medical renal disease. 3. No evidence of urinary obstruction. 4.  Aortic Atherosclerosis (ICD10-I70.0). Electronically Signed   By: Sandi Mariscal M.D.   On: 02/04/2020 12:12   ECHOCARDIOGRAM COMPLETE  Result Date: 02/05/2020    ECHOCARDIOGRAM REPORT   Patient Name:   GREGERY WALBERG Date of Exam: 02/04/2020 Medical Rec #:  [id number]           Height:       69.0 in Accession #:    [id number]          Weight:       155.0 lb Date of Birth:  09/15/1950           BSA:          1.854 m Patient Age:  69 years            BP:           143/81 mmHg Patient Gender: M                   HR:           74 bpm. Exam Location:  [facility] Procedure: 2D Echo, Color Doppler and Cardiac Doppler Indications:     Chest pain R07.9  History:         Patient has no prior history of Echocardiogram examinations.                  Previous Myocardial Infarction; Risk Factors:Diabetes and                  Hypertension.  Sonographer:     Sherrie Sport RDCS (AE) Referring Phys:  [place] Diagnosing Phys: Yolonda Kida MD IMPRESSIONS  1. Left ventricular ejection fraction, by estimation, is 60 to 65%. The left ventricle has normal function. The left ventricle has no regional wall motion abnormalities. Left ventricular diastolic parameters were normal.  2. Right ventricular systolic function is normal. The right ventricular size is normal.  3. The mitral valve is grossly normal. Trivial mitral valve regurgitation.  4. The aortic valve is normal in structure. Aortic valve regurgitation is not visualized. FINDINGS  Left Ventricle: Left ventricular ejection fraction, by  estimation, is 60 to 65%. The left ventricle has normal function. The left ventricle has no regional wall motion abnormalities. The left ventricular internal cavity size was normal in size. There is  borderline left ventricular hypertrophy. Left ventricular diastolic parameters were normal. Right Ventricle: The right ventricular size is normal. No increase in right ventricular wall thickness. Right ventricular systolic function is normal. Left Atrium: Left atrial size was normal in size. Right Atrium: Right atrial size was normal in size. Pericardium: Trivial pericardial effusion is present. Mitral Valve: The mitral valve is grossly normal. There is mild thickening of the mitral valve leaflet(s). There is mild calcification of the mitral valve leaflet(s). Mild mitral annular calcification. Trivial mitral valve regurgitation. Tricuspid Valve: The tricuspid valve is grossly normal. Tricuspid valve regurgitation is trivial. Aortic Valve: The aortic valve is normal in structure. Aortic valve regurgitation is not visualized. Aortic valve mean gradient measures 7.0 mmHg. Aortic valve peak gradient measures 10.1 mmHg. Aortic valve area, by VTI measures 2.97 cm. Pulmonic Valve: The pulmonic valve was normal in structure. Pulmonic valve regurgitation is not visualized. Aorta: The ascending aorta was not well visualized. IAS/Shunts: No atrial level shunt detected by color flow Doppler.  LEFT VENTRICLE PLAX 2D LVIDd:         3.68 cm  Diastology LVIDs:         2.57 cm  LV e' medial:    7.51 cm/s LV PW:         1.37 cm  LV E/e' medial:  12.5 LV IVS:        1.10 cm  LV e' lateral:   6.96 cm/s LVOT diam:     2.10 cm  LV E/e' lateral: 13.5 LV SV:         103 LV SV Index:   56 LVOT Area:     3.46 cm  RIGHT VENTRICLE RV Basal diam:  2.69 cm RV S prime:     16.00 cm/s TAPSE (M-mode): 4.1 cm LEFT ATRIUM  Index       RIGHT ATRIUM           Index LA diam:        3.60 cm 1.94 cm/m  RA Area:     18.80 cm LA Vol (A2C):   67.5  ml 36.41 ml/m RA Volume:   45.90 ml  24.76 ml/m LA Vol (A4C):   57.4 ml 30.96 ml/m LA Biplane Vol: 65.5 ml 35.33 ml/m  AORTIC VALVE                    PULMONIC VALVE AV Area (Vmax):    2.88 cm     PV Vmax:        0.88 m/s AV Area (Vmean):   2.60 cm     PV Peak grad:   3.1 mmHg AV Area (VTI):     2.97 cm     RVOT Peak grad: 3 mmHg AV Vmax:           159.00 cm/s AV Vmean:          127.000 cm/s AV VTI:            0.347 m AV Peak Grad:      10.1 mmHg AV Mean Grad:      7.0 mmHg LVOT Vmax:         132.00 cm/s LVOT Vmean:        95.500 cm/s LVOT VTI:          0.298 m LVOT/AV VTI ratio: 0.86  AORTA Ao Root diam: 3.80 cm MITRAL VALVE               TRICUSPID VALVE MV Area (PHT): 3.28 cm    TR Peak grad:   22.7 mmHg MV Decel Time: 231 msec    TR Vmax:        238.00 cm/s MV E velocity: 93.80 cm/s MV A velocity: 76.30 cm/s  SHUNTS MV E/A ratio:  1.23        Systemic VTI:  0.30 m                            Systemic Diam: 2.10 cm Yolonda Kida MD Electronically signed by Yolonda Kida MD Signature Date/Time: 02/05/2020/3:12:37 PM    Final     Labs:  CBC: Recent Labs    05/14/19 2155 05/15/19 0640 05/15/19 1929 02/02/20 2240 02/04/20 0741 02/05/20 0656  WBC 12.8*  --   --  13.5* 10.5 10.1  HGB 11.0*   < > 9.1* 9.4* 8.0* 8.2*  HCT 32.5*  --  26.8* 28.7* 23.7* 24.6*  PLT 234  --   --  233 162 163   < > = values in this interval not displayed.    COAGS: Recent Labs    05/15/19 0633  INR 1.0  APTT 44*    BMP: Recent Labs    05/14/19 2155 02/02/20 2240 02/04/20 0741 02/05/20 0656  NA 141 141 139 138  K 4.1 3.6 3.4* 3.5  CL 111 112* 110 109  CO2 21* 16* 19* 16*  GLUCOSE 112* 128* 91 71  BUN 27* 25* 40* 48*  CALCIUM 9.0 7.6* 7.0* 7.4*  CREATININE 1.90* 2.66* 3.58* 4.02*  GFRNONAA 35* 25* 18* 15*  GFRAA 41*  --   --   --     LIVER FUNCTION TESTS: Recent Labs    05/14/19 2155 02/02/20 2240  BILITOT 0.8 1.1  AST 22 23  ALT 14 13  ALKPHOS 72 99  PROT 6.7 5.9*  ALBUMIN  3.0* 2.1*    TUMOR MARKERS: No results for input(s): AFPTM, CEA, CA199, CHROMGRNA in the last 8760 hours.  Assessment and Plan:  Proteinuria; acute on chronic kidney injury: Leanor Kail. Thompson, 70 year old male, is tentatively scheduled for an image-guided non-focal kidney biopsy at the [facility] IR department 02/07/20.   Risks and benefits of this procedure was discussed with the patient and/or patient's family including, but not limited to bleeding, infection, damage to adjacent structures or low yield requiring additional tests.  All of the questions were answered and there is agreement to proceed.  The patient will be NPO after midnight 02/07/20. Labs have been ordered. Last dose of Eliquis was 0928 on 02/05/20.   Consent signed and in chart.  Thank you for this interesting consult.  I greatly enjoyed meeting DEUNTA BENEKE and look forward to participating in their care.  A copy of this report was sent to the requesting provider on this date.  Electronically Signed: Soyla Dryer, AGACNP-BC [phone] 02/05/2020, 3:55 PM   I spent a total of 20 Minutes    in face to face in clinical consultation, greater than 50% of which was counseling/coordinating care for image-guided non-focal renal biopsy.

## 2020-02-05 NOTE — Evaluation (Signed)
Physical Therapy Evaluation Patient Details Name: Casey French MRN: [id number] DOB: 18-Nov-1950 Today's Date: 02/05/2020   History of Present Illness  Pt is a 70 y/o M wtih PMH: HTN, CKD stage III/IV, CAD status post stent in 2003/2017, atrial fibrillation on anticoagulation, anemia to the ED with complaint of substernal chest pain also with associated shortness of breath, nausea and few episodes of nonbloody nonbilious vomiting. Pt with mildly elevated troponin which cardiology note indicates "likely not due to to acute coronary syndrome, more likely due to hypertensive  urgency with systolic blood pressures greater than 220. Pt admitted for hypertensive urgerncy. Pt's BP better controlled at time of therapy evaluation with most recent reading 148/80.  Clinical Impression  70 yo male reports functioning at baseline. He lives at home with his wife and reports being mostly independent with all ADLs. He does have a SPC which he purchased for after his hip surgery later this month. Patient has a PMH of chronic left hip pain and is supposed to have left hip surgery with Dr. Rudene Christians on 02/21/20. He is currently mod I for bed mobility and transfers. He ambulated >160 feet without AD with good reciprocal gait pattern. His 10 feet walk test was 1.9 feet/sec. Patient's vitals assessed after gait with SPO2 96%, HR 67 bpm. Patient was able to negotiate steps with intermittent rail assist, reciprocally mod I. Patient is currently functioning at baseline. He does not demonstrate need for additional skilled PT Intervention. Patient agreeable. Did educate patient to get up and move around while in hospital to keep strength and mobility up.     Follow Up Recommendations No PT follow up    Equipment Recommendations  None recommended by PT    Recommendations for Other Services       Precautions / Restrictions Precautions Precautions: Fall Precaution Comments: has had 1-2 falls in last 6 months as a result of  left hip pain; Restrictions Weight Bearing Restrictions: No      Mobility  Bed Mobility Overal bed mobility: Modified Independent             General bed mobility comments: exhibits good safety awareness and positioning; Patient Response: Cooperative  Transfers Overall transfer level: Modified independent Equipment used: None             General transfer comment: able to transfer without AD with good safety awareness and positioning;  Ambulation/Gait Ambulation/Gait assistance: Modified independent (Device/Increase time) Gait Distance (Feet): 175 Feet Assistive device: None Gait Pattern/deviations: WFL(Within Functional Limits) Gait velocity: 1.9 feet/sec on level surface; Gait velocity interpretation: 1.31 - 2.62 ft/sec, indicative of limited community ambulator General Gait Details: ambulates with reciprocal gait pattern, good foot clearance and step length.  Stairs Patient negotiated 4 steps, with intermittent rail assist, forward reciprocally with good safety awareness and positioning, mod Independent            Wheelchair Mobility    Modified Rankin (Stroke Patients Only)       Balance Overall balance assessment: No apparent balance deficits (not formally assessed)                                           Pertinent Vitals/Pain Pain Assessment: 0-10 Pain Score: 3  Faces Pain Scale: Hurts little more Pain Location: back pain; denies any hip pain currently; Pain Descriptors / Indicators: Sore;Discomfort Pain Intervention(s): Limited activity within patient's tolerance;Repositioned;Monitored  during session    [place] expects to be discharged to:: Private residence Living Arrangements: Spouse/significant other Available Help at Discharge: Family;Available PRN/intermittently (pt's spouse works in admissions at [employer] and isa student as well.) Type of Home: House Home Access: Stairs to enter Entrance  Stairs-Rails: Can reach both Entrance Stairs-Number of Steps: 4 in the front, 6-7 in the back entrance. Home Layout: One level Home Equipment: Cane - single point Additional Comments: got [facility] for left hip replacement with Dr. Rudene Christians that was supposed to happen 02/21/20    Prior Function Level of Independence: Independent         Comments: +drives and runs errands. endorses 1-2 falls in last 6 months in which he "stood up wrong and felt my left hip pop)     Hand Dominance   Dominant Hand: Right    Extremity/Trunk Assessment   Upper Extremity Assessment Upper Extremity Assessment: Overall WFL for tasks assessed;Generalized weakness (ROM WFL, MMT grossly 4-/5)    Lower Extremity Assessment Lower Extremity Assessment: Overall WFL for tasks assessed LLE Deficits / Details: decreased ROM in LLE due to chronic pain and OA; LLE Sensation: WNL LLE Coordination: WNL    Cervical / Trunk Assessment Cervical / Trunk Assessment: Normal  Communication   Communication: No difficulties  Cognition Arousal/Alertness: Awake/alert Behavior During Therapy: WFL for tasks assessed/performed Overall Cognitive Status: Within Functional Limits for tasks assessed                                        General Comments General comments (skin integrity, edema, etc.): no swelling noted, no skin breakdown noted    Exercises Other Exercises Other Exercises: OT facilitates ed re: role of OT in acute setting, importance of OOB activity. Pt agreeable and with good understanding.   Assessment/Plan    PT Assessment Patent does not need any further PT services  PT Problem List         PT Treatment Interventions      PT Goals (Current goals can be found in the Care Plan section)  Acute Rehab PT Goals Patient Stated Goal: to go home PT Goal Formulation: With patient Time For Goal Achievement: 02/05/20 Potential to Achieve Goals: Good    Frequency     Barriers to discharge         Co-evaluation               AM-PAC PT "6 Clicks" Mobility  Outcome Measure Help needed turning from your back to your side while in a flat bed without using bedrails?: None Help needed moving from lying on your back to sitting on the side of a flat bed without using bedrails?: None Help needed moving to and from a bed to a chair (including a wheelchair)?: None Help needed standing up from a chair using your arms (e.g., wheelchair or bedside chair)?: None Help needed to walk in hospital room?: None Help needed climbing 3-5 steps with a railing? : None 6 Click Score: 24    End of Session Equipment Utilized During Treatment: Gait belt Activity Tolerance: Patient tolerated treatment well Patient left: in bed;with call bell/phone within reach Nurse Communication: Mobility status PT Visit Diagnosis: Unsteadiness on feet (R26.81)    Time: 1210-1220 PT Time Calculation (min) (ACUTE ONLY): 10 min   Charges:   PT Evaluation $PT Eval Low Complexity: 1 Low  Eavan Gonterman PT, DPT 02/05/2020, 1:41 PM

## 2020-02-05 NOTE — Plan of Care (Signed)
Pt ambulating in the hallway with OT at this time,NAD noted.Pt A&Ox4,denies any pain or discomfort.Pt independent with ADLs,educated to call for assistance when needed. voids in the RR as needed.Appetite and fluid intake good.Pt seen by Dr Jimmye Norman earlier, no new orders at this time.will cont to monitor.

## 2020-02-05 NOTE — Progress Notes (Signed)
[facility], [place] 02/05/20  Subjective:   LOS: 2 No intake/output data recorded.  Patient known to our practice from outpatient follow-up.  He was last seen by Dr. Juleen China in October 2021.  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  He has underlying problems of hypertension, diabetes and nonsteroidal use.  He was started on torsemide in October but not sure if he is taking it.  He presents to the emergency room today for chest pain and elevated blood pressure.  He also reported vomiting and feeling pale.  Initial blood pressure of 221/104.  Blood sugar of 146.  Patient was given IV labetalol with some improvement in blood pressure of 180/96.  Patient is also noted to have increased creatinine of 2.66.  His baseline appears to be 2.23 from November 15, 2019.  Outpatient urine protein to creatinine ratio of 15 g.  Hemoglobin A1c of 6.2%.  Today, patient overall feels better Blood pressure control has improved Serum creatinine however is higher  Objective:  Vital signs in last 24 hours:  Temp:  [97.6 F (36.4 C)-99.2 F (37.3 C)] 98.4 F (36.9 C) (01/11 0852) Pulse Rate:  [58-78] 60 (01/11 1150) Resp:  [16-27] 16 (01/11 1150) BP: (115-165)/(64-89) 124/71 (01/11 1150) SpO2:  [94 %-97 %] 97 % (01/11 1150)  Weight change:  Filed Weights   02/02/20 2236  Weight: 70.3 kg    Intake/Output:    Intake/Output Summary (Last 24 hours) at 02/05/2020 1206 Last data filed at 02/05/2020 0350 Gross per 24 hour  Intake --  Output 0 ml  Net 0 ml    Physical Exam: General:  No acute distress, laying in the bed  HEENT  anicteric, moist oral mucous membrane  Pulm/lungs  normal breathing effort, clear to auscultation  CVS/Heart  irregular rhythm, no rub or gallop  Abdomen:   Soft, nontender  Extremities:  No peripheral edema  Neurologic:  Alert, oriented, able to follow commands  Skin:  No acute rashes    Basic Metabolic  Panel:  Recent Labs  Lab 02/02/20 2240 02/04/20 0741 02/05/20 0656  NA 141 139 138  K 3.6 3.4* 3.5  CL 112* 110 109  CO2 16* 19* 16*  GLUCOSE 128* 91 71  BUN 25* 40* 48*  CREATININE 2.66* 3.58* 4.02*  CALCIUM 7.6* 7.0* 7.4*     CBC: Recent Labs  Lab 02/02/20 2240 02/04/20 0741 02/05/20 0656  WBC 13.5* 10.5 10.1  HGB 9.4* 8.0* 8.2*  HCT 28.7* 23.7* 24.6*  MCV 95.3 92.9 95.3  PLT 233 162 163     No results found for: HEPBSAG, HEPBSAB, HEPBIGM    Microbiology:  Recent Results (from the past 240 hour(s))  Resp Panel by RT-PCR (Flu A&B, Covid) Nasopharyngeal Swab     Status: None   Collection Time: 02/03/20  7:36 AM   Specimen: Nasopharyngeal Swab; Nasopharyngeal(NP) swabs in vial transport medium  Result Value Ref Range Status   SARS Coronavirus 2 by RT PCR NEGATIVE NEGATIVE Final    Comment: (NOTE) SARS-CoV-2 target nucleic acids are NOT DETECTED.  The SARS-CoV-2 RNA is generally detectable in upper respiratory specimens during the acute phase of infection. The lowest concentration of SARS-CoV-2 viral copies this assay can detect is 138 copies/mL. A negative result does not preclude SARS-Cov-2 infection and should not be used as the sole basis for treatment or other patient management decisions. A negative result may occur with  improper specimen collection/handling, submission of specimen other than  nasopharyngeal swab, presence of viral mutation(s) within the areas targeted by this assay, and inadequate number of viral copies(<138 copies/mL). A negative result must be combined with clinical observations, patient history, and epidemiological information. The expected result is Negative.  Fact Sheet for Patients:  EntrepreneurPulse.com.au  Fact Sheet for Healthcare Providers:  IncredibleEmployment.be  This test is no t yet approved or cleared by the [place] FDA and  has been authorized for detection and/or  diagnosis of SARS-CoV-2 by FDA under an Emergency Use Authorization (EUA). This EUA will remain  in effect (meaning this test can be used) for the duration of the COVID-19 declaration under Section 564(b)(1) of the Act, 21 U.S.C.section 360bbb-3(b)(1), unless the authorization is terminated  or revoked sooner.       Influenza A by PCR NEGATIVE NEGATIVE Final   Influenza B by PCR NEGATIVE NEGATIVE Final    Comment: (NOTE) The Xpert Xpress SARS-CoV-2/FLU/RSV plus assay is intended as an aid in the diagnosis of influenza from Nasopharyngeal swab specimens and should not be used as a sole basis for treatment. Nasal washings and aspirates are unacceptable for Xpert Xpress SARS-CoV-2/FLU/RSV testing.  Fact Sheet for Patients: EntrepreneurPulse.com.au  Fact Sheet for Healthcare Providers: IncredibleEmployment.be  This test is not yet approved or cleared by the [place] FDA and has been authorized for detection and/or diagnosis of SARS-CoV-2 by FDA under an Emergency Use Authorization (EUA). This EUA will remain in effect (meaning this test can be used) for the duration of the COVID-19 declaration under Section 564(b)(1) of the Act, 21 U.S.C. section 360bbb-3(b)(1), unless the authorization is terminated or revoked.  Performed at [facility], [address] [place], [place] [address]     Coagulation Studies: No results for input(s): LABPROT, INR in the last 72 hours.  Urinalysis: No results for input(s): COLORURINE, LABSPEC, PHURINE, GLUCOSEU, HGBUR, BILIRUBINUR, KETONESUR, PROTEINUR, UROBILINOGEN, NITRITE, LEUKOCYTESUR in the last 72 hours.  Invalid input(s): APPERANCEUR    Imaging: CT HEAD WO CONTRAST  Result Date: 02/03/2020 CLINICAL DATA:  Head EXAM: CT HEAD WITHOUT CONTRAST TECHNIQUE: Contiguous axial images were obtained from the base of the skull through the vertex without intravenous contrast. COMPARISON:  April 16, 2013 FINDINGS: Brain: There is hypodensity within the LEFT occipital lobe (series 3, image 13). This is increased in conspicuity in comparison to prior. No evidence of hemorrhage. Periventricular white matter hypodensities consistent with sequela of chronic microvascular ischemic disease. Global parenchymal volume loss. Remote RIGHT insular lacunar infarction versus dilated perivascular space. Vascular: Vascular calcifications of the carotid siphons. Skull: Normal. Negative for fracture or focal lesion. Sinuses/Orbits: No acute finding. Other: None. IMPRESSION: 1. Hypodensity within the LEFT occipital lobe is increased in conspicuity in comparison to prior study. This is nonspecific and could reflect age-indeterminate infarction versus artifact. Consider further evaluation with dedicated MRI if concern for infarction. 2. No evidence of hemorrhage. Electronically Signed   By: Valentino Saxon MD   On: 02/03/2020 14:10   MR BRAIN WO CONTRAST  Result Date: 02/03/2020 CLINICAL DATA:  Initial evaluation for acute stroke. EXAM: MRI HEAD WITHOUT CONTRAST TECHNIQUE: Multiplanar, multiecho pulse sequences of the brain and surrounding structures were obtained without intravenous contrast. COMPARISON:  Prior head CT from earlier the same day. FINDINGS: Brain: Cerebral volume within normal limits for age. Probable underlying patchy T2/FLAIR hyperintensity about the periventricular and deep white matter, most like related chronic microvascular ischemic disease. Suspected involvement of the pons as well. Superimposed remote lacunar infarcts present at the right lentiform  nucleus, right thalamus, and left middle cerebellar peduncle. There is fairly extensive patchy T2/FLAIR signal abnormality involving the cortical and subcortical aspects of both frontal, parietal, and occipital lobes. Patchy and fairly symmetric involvement of the medial thalami. Additional patchy multifocal involvement of the cerebellar hemispheres  bilaterally. Findings are nonspecific, but favored to reflect changes of PRES. No significant mass effect, associated hemorrhage or other complication. No foci of restricted diffusion to suggest acute or subacute ischemia. Gray-white matter differentiation maintained. No other areas of remote cortical infarction. No evidence for acute or chronic intracranial hemorrhage. No mass lesion, midline shift or mass effect. No hydrocephalus or extra-axial fluid collection. Pituitary gland suprasellar region normal. Midline structures intact. Vascular: Major intracranial vascular flow voids are maintained. Skull and upper cervical spine: Craniocervical junction within normal limits. Bone marrow signal intensity normal. No scalp soft tissue abnormality. Sinuses/Orbits: Patient status post ocular lens replacement on the right. Globes and orbital soft tissues demonstrate no acute finding. Paranasal sinuses are largely clear. No mastoid effusion. Inner ear structures grossly normal. Other: None. IMPRESSION: 1. Extensive patchy T2/FLAIR signal abnormality involving the cortical and subcortical aspects of both frontal, parietal, and occipital lobes as well as the medial thalami and cerebellum. Findings are nonspecific, but favored to reflect changes of PRES. 2. Underlying age-related cerebral atrophy with chronic microvascular ischemic disease, with multiple remote lacunar infarcts involving the right basal ganglia, right thalamus, and left middle cerebellar peduncle. Electronically Signed   By: Jeannine Boga M.D.   On: 02/03/2020 18:34   US RENAL ARTERY DUPLEX COMPLETE  Result Date: 02/04/2020 CLINICAL DATA:  Hypertensive urgency. Evaluate for renal artery stenosis. EXAM: RENAL/URINARY TRACT ULTRASOUND RENAL DUPLEX DOPPLER ULTRASOUND COMPARISON:  CT abdomen and pelvis-02/03/2020 FINDINGS: Right Kidney: Normal cortical thickness and size, measuring 11.2 cm in length, however there is diffuse increased cortical  echogenicity. Note is made of an approximately 1.7 x 1.7 x 1.5 cm exophytic anechoic cyst arising from the inferior pole of the right kidney (images 31 and 32). No echogenic renal stones. No urinary obstruction. Left Kidney: Normal cortical thickness and size, measuring 11.4 cm in length, however there is diffuse increased cortical echogenicity. Note is made of an approximately 0.9 x 0.9 x 0.8 cm anechoic lesion arising from the inferior pole the left kidney, too small to adequately characterize though favored to represent a renal cyst. No echogenic renal stones. No urinary obstruction. Bladder:  Appears normal given degree of distention. _________________________________________________________ RENAL DUPLEX ULTRASOUND Right Renal Artery Velocities: Origin:  66 cm/sec Mid:  53 cm/sec [place]:  43 cm/sec Interlobar:  21 cm/sec Arcuate:  23 cm/sec Left Renal Artery Velocities: Origin:  144 cm/sec Mid:  83 cm/sec Hilum:  73 cm/sec Interlobar:  25 cm/sec Arcuate:  22 cm/sec Aortic Velocity:  176 cm/sec Right Renal-Aortic Ratios: Origin: 0.4 Mid:  0.3 Hilum: 0.2 Interlobar: 0.1 Arcuate: 0.1 Left Renal-Aortic Ratios: Origin: 1 0.8 Mid: 0.5 Hilum: 0.2 Interlobar: 0.2 Arcuate: 0.1 Normal velocities and low resistance waveforms are demonstrated throughout the interrogated portions of the bilateral renal arteries and renal parenchyma. Bilateral renal veins appear patent where imaged. Scattered eccentric mixed echogenic plaque is seen throughout the normal caliber abdominal aorta. IMPRESSION: 1. No evidence of renal artery stenosis. 2. Bilateral increased cortical echogenicity without evidence of atrophy, nonspecific though could be seen in the setting medical renal disease. 3. No evidence of urinary obstruction. 4.  Aortic Atherosclerosis (ICD10-I70.0). Electronically Signed   By: Sandi Mariscal M.D.   On: 02/04/2020 12:12  Medications:    Scheduled Meds: . amLODipine  5 mg Oral Daily  . apixaban  5 mg Oral BID  .  cloNIDine  0.1 mg Oral BID  . gabapentin  300 mg Oral QHS  . influenza vaccine adjuvanted  0.5 mL Intramuscular Tomorrow-1000  . isosorbide mononitrate  60 mg Oral Daily  . metoprolol succinate  200 mg Oral Daily  . pantoprazole  40 mg Oral BID  . rosuvastatin  40 mg Oral Daily  . sodium chloride flush  3 mL Intravenous Q12H   Continuous Infusions: . sodium chloride     PRN Meds:.sodium chloride, acetaminophen, labetalol, ondansetron (ZOFRAN) IV, oxyCODONE, promethazine, sodium chloride flush    Assessment/ Plan:  70 y.o. male with coronary artery disease, history of STEMI with stent in October 2003, PCI 2017.  Atrial fibrillation, diabetes, gout, chronic kidney disease  Principal Problem:   Hypertensive urgency Active Problems:   AKI (acute kidney injury) ([place])   Benign essential HTN   DM type 2 (diabetes mellitus, type 2) ([facility])   CAD (coronary artery disease)   Atrial fibrillation, chronic ([place])   Chronic kidney disease   #.  Acute kidney injury with CKD st 4 with baseline creatinine of 2.23, GFR 29 in October 2021.  Presenting creatinine of 2.66 Recent Labs    02/02/20 2240 02/04/20 0741 02/05/20 0656  CREATININE 2.66* 3.58* 4.02*  AKI likely secondary to accelerated hypertension Agree with restarting home medications Blood pressure control has improved overall   #Proteinuria Urine protein to creatinine ratio 15 g in October 2021 Serum kappa lambda ratio of 1.35, SPEP negative in October 2021 Screening serologies ordered, ANA neg, complements normal Proteinuria and CKD not explained by hypertension and diabetes. Recommend renal biopsy   #. Anemia of CKD  Lab Results  Component Value Date   HGB 8.2 (L) 02/05/2020    #. HTN with CKD Current regimen includes amlodipine 5 mg daily Clonidine 0.1 mg twice a day Isosorbide 60 mg daily Metoprolol 200 mg daily Renal artery duplex exam neg.   #. Diabetes type 2 with CKD Hemoglobin A1C (%)  Date Value   09/27/2012 6.4 (H)   Hgb A1c MFr Bld (%)  Date Value  05/15/2019 6.0 (H)  Rosuvastatin 40 mg daily for cardiovascular risk reduction  #Atrial fibrillation Anticoagulation with Eliquis On hold for renal biopsy   LOS: 2 Chelsei Mcchesney 1/11/202212:06 [place], [place]

## 2020-02-05 NOTE — Progress Notes (Signed)
PROGRESS NOTE    BARAN KUHRT  VOJ:[id number] DOB: 23-Jun-1950 DOA: 02/03/2020 PCP: [name], MD    Assessment & Plan:   Principal Problem:   Hypertensive urgency Active Problems:   AKI (acute kidney injury) ([place])   Benign essential HTN   DM type 2 (diabetes mellitus, type 2) ([place])   CAD (coronary artery disease)   Atrial fibrillation, chronic ([place])   Chronic kidney disease   Hypertensive urgency: resolved but still has HTN. Continue on clonidine, amlodipine, metoprolol, imdur.   Chest pain: with mildly elevated high sensitive troponin which is likely secondary to demand ischemia. Chest pain has since resolved   CAD: s/p stent. Continue on metoprolol, eliquis, imdur & statin   PAF: continue on metoprolol, eliquis   AKI on CKD stage IV: baseline Cr 2.23. Cr is trending up daily. Nephro following and recs appreciated. 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 No need for a transfusion at this time   DM2: well controlled w/ HbA1c 6.0. Not on any po anti-DM meds at home   DVT prophylaxis: eliquis  Code Status: full  Family Communication:  Disposition Plan: likely d/c back home  Status is: Inpatient  Remains inpatient appropriate because:IV treatments appropriate due to intensity of illness or inability to take PO and Inpatient level of care appropriate due to severity of illness   Dispo: The patient is from: Home              Anticipated d/c is to: Home              Anticipated d/c date is: 1 day              Patient currently is not medically stable to d/c.      Consultants:   nephro    Procedures:    Antimicrobials:    Subjective: Pt c/o malaise   Objective: Vitals:   02/04/20 1706 02/04/20 2006 02/04/20 2343 02/05/20 0350  BP: (!) 158/78 137/80 136/65 121/75  Pulse: 60 78 66 71  Resp: 16 17 17 16   Temp: 98.1 F (36.7 C) 97.6  F (36.4 C) 98.5 F (36.9 C) 99.2 F (37.3 C)  TempSrc:      SpO2: 97% 95% 97% 95%  Weight:      Height:        Intake/Output Summary (Last 24 hours) at 02/05/2020 0810 Last data filed at 02/05/2020 0350 Gross per 24 hour  Intake -  Output 0 ml  Net 0 ml   Filed Weights   02/02/20 2236  Weight: 70.3 kg    Examination:  General exam: Appears calm and comfortable  Respiratory system: Clear to auscultation. Respiratory effort normal. Cardiovascular system: S1 & S2 +. No  rubs, gallops or clicks.  Gastrointestinal system: Abdomen is nondistended, soft and nontender. Normal bowel sounds heard. Central nervous system: Alert and oriented. Moves all 4 extremities  Psychiatry: Judgement and insight appear normal. Flat mood and affect     Data Reviewed: I have personally reviewed following labs and imaging studies  CBC: Recent Labs  Lab 02/02/20 2240 02/04/20 0741 02/05/20 0656  WBC 13.5* 10.5 10.1  HGB 9.4* 8.0* 8.2*  HCT 28.7* 23.7* 24.6*  MCV 95.3 92.9 95.3  PLT 233 162 993   Basic Metabolic Panel: Recent Labs  Lab 02/02/20 2240 02/04/20 0741 02/05/20 0656  NA 141 139 138  K 3.6  3.4* 3.5  CL 112* 110 109  CO2 16* 19* 16*  GLUCOSE 128* 91 71  BUN 25* 40* 48*  CREATININE 2.66* 3.58* 4.02*  CALCIUM 7.6* 7.0* 7.4*   GFR: Estimated Creatinine Clearance: 17.5 mL/min (A) (by C-G formula based on SCr of 4.02 mg/dL (H)). Liver Function Tests: Recent Labs  Lab 02/02/20 2240  AST 23  ALT 13  ALKPHOS 99  BILITOT 1.1  PROT 5.9*  ALBUMIN 2.1*   Recent Labs  Lab 02/03/20 0531  LIPASE 25   No results for input(s): AMMONIA in the last 168 hours. Coagulation Profile: No results for input(s): INR, PROTIME in the last 168 hours. Cardiac Enzymes: No results for input(s): CKTOTAL, CKMB, CKMBINDEX, TROPONINI in the last 168 hours. BNP (last 3 results) No results for input(s): PROBNP in the last 8760 hours. HbA1C: No results for input(s): HGBA1C in the last 72  hours. CBG: No results for input(s): GLUCAP in the last 168 hours. Lipid Profile: No results for input(s): CHOL, HDL, LDLCALC, TRIG, CHOLHDL, LDLDIRECT in the last 72 hours. Thyroid Function Tests: No results for input(s): TSH, T4TOTAL, FREET4, T3FREE, THYROIDAB in the last 72 hours. Anemia Panel: No results for input(s): VITAMINB12, FOLATE, FERRITIN, TIBC, IRON, RETICCTPCT in the last 72 hours. Sepsis Labs: No results for input(s): PROCALCITON, LATICACIDVEN in the last 168 hours.  Recent Results (from the past 240 hour(s))  Resp Panel by RT-PCR (Flu A&B, Covid) Nasopharyngeal Swab     Status: None   Collection Time: 02/03/20  7:36 AM   Specimen: Nasopharyngeal Swab; Nasopharyngeal(NP) swabs in vial transport medium  Result Value Ref Range Status   SARS Coronavirus 2 by RT PCR NEGATIVE NEGATIVE Final    Comment: (NOTE) SARS-CoV-2 target nucleic acids are NOT DETECTED.  The SARS-CoV-2 RNA is generally detectable in upper respiratory specimens during the acute phase of infection. The lowest concentration of SARS-CoV-2 viral copies this assay can detect is 138 copies/mL. A negative result does not preclude SARS-Cov-2 infection and should not be used as the sole basis for treatment or other patient management decisions. A negative result may occur with  improper specimen collection/handling, submission of specimen other than nasopharyngeal swab, presence of viral mutation(s) within the areas targeted by this assay, and inadequate number of viral copies(<138 copies/mL). A negative result must be combined with clinical observations, patient history, and epidemiological information. The expected result is Negative.  Fact Sheet for Patients:  EntrepreneurPulse.com.au  Fact Sheet for Healthcare Providers:  IncredibleEmployment.be  This test is no t yet approved or cleared by the [place] FDA and  has been authorized for detection and/or  diagnosis of SARS-CoV-2 by FDA under an Emergency Use Authorization (EUA). This EUA will remain  in effect (meaning this test can be used) for the duration of the COVID-19 declaration under Section 564(b)(1) of the Act, 21 U.S.C.section 360bbb-3(b)(1), unless the authorization is terminated  or revoked sooner.       Influenza A by PCR NEGATIVE NEGATIVE Final   Influenza B by PCR NEGATIVE NEGATIVE Final    Comment: (NOTE) The Xpert Xpress SARS-CoV-2/FLU/RSV plus assay is intended as an aid in the diagnosis of influenza from Nasopharyngeal swab specimens and should not be used as a sole basis for treatment. Nasal washings and aspirates are unacceptable for Xpert Xpress SARS-CoV-2/FLU/RSV testing.  Fact Sheet for Patients: EntrepreneurPulse.com.au  Fact Sheet for Healthcare Providers: IncredibleEmployment.be  This test is not yet approved or cleared by the [place] and has been authorized for  detection and/or diagnosis of SARS-CoV-2 by FDA under an Emergency Use Authorization (EUA). This EUA will remain in effect (meaning this test can be used) for the duration of the COVID-19 declaration under Section 564(b)(1) of the Act, 21 U.S.C. section 360bbb-3(b)(1), unless the authorization is terminated or revoked.  Performed at [facility], [address] [place], [place] [address]          Radiology Studies: CT HEAD WO CONTRAST  Result Date: 02/03/2020 CLINICAL DATA:  Head EXAM: CT HEAD WITHOUT CONTRAST TECHNIQUE: Contiguous axial images were obtained from the base of the skull through the vertex without intravenous contrast. COMPARISON:  April 16, 2013 FINDINGS: Brain: There is hypodensity within the LEFT occipital lobe (series 3, image 13). This is increased in conspicuity in comparison to prior. No evidence of hemorrhage. Periventricular white matter hypodensities consistent with sequela of chronic microvascular ischemic  disease. Global parenchymal volume loss. Remote RIGHT insular lacunar infarction versus dilated perivascular space. Vascular: Vascular calcifications of the carotid siphons. Skull: Normal. Negative for fracture or focal lesion. Sinuses/Orbits: No acute finding. Other: None. IMPRESSION: 1. Hypodensity within the LEFT occipital lobe is increased in conspicuity in comparison to prior study. This is nonspecific and could reflect age-indeterminate infarction versus artifact. Consider further evaluation with dedicated MRI if concern for infarction. 2. No evidence of hemorrhage. Electronically Signed   By: Valentino Saxon MD   On: 02/03/2020 14:10   MR BRAIN WO CONTRAST  Result Date: 02/03/2020 CLINICAL DATA:  Initial evaluation for acute stroke. EXAM: MRI HEAD WITHOUT CONTRAST TECHNIQUE: Multiplanar, multiecho pulse sequences of the brain and surrounding structures were obtained without intravenous contrast. COMPARISON:  Prior head CT from earlier the same day. FINDINGS: Brain: Cerebral volume within normal limits for age. Probable underlying patchy T2/FLAIR hyperintensity about the periventricular and deep white matter, most like related chronic microvascular ischemic disease. Suspected involvement of the pons as well. Superimposed remote lacunar infarcts present at the right lentiform nucleus, right thalamus, and left middle cerebellar peduncle. There is fairly extensive patchy T2/FLAIR signal abnormality involving the cortical and subcortical aspects of both frontal, parietal, and occipital lobes. Patchy and fairly symmetric involvement of the medial thalami. Additional patchy multifocal involvement of the cerebellar hemispheres bilaterally. Findings are nonspecific, but favored to reflect changes of PRES. No significant mass effect, associated hemorrhage or other complication. No foci of restricted diffusion to suggest acute or subacute ischemia. Gray-white matter differentiation maintained. No other areas of  remote cortical infarction. No evidence for acute or chronic intracranial hemorrhage. No mass lesion, midline shift or mass effect. No hydrocephalus or extra-axial fluid collection. Pituitary gland suprasellar region normal. Midline structures intact. Vascular: Major intracranial vascular flow voids are maintained. Skull and upper cervical spine: Craniocervical junction within normal limits. Bone marrow signal intensity normal. No scalp soft tissue abnormality. Sinuses/Orbits: Patient status post ocular lens replacement on the right. Globes and orbital soft tissues demonstrate no acute finding. Paranasal sinuses are largely clear. No mastoid effusion. Inner ear structures grossly normal. Other: None. IMPRESSION: 1. Extensive patchy T2/FLAIR signal abnormality involving the cortical and subcortical aspects of both frontal, parietal, and occipital lobes as well as the medial thalami and cerebellum. Findings are nonspecific, but favored to reflect changes of PRES. 2. Underlying age-related cerebral atrophy with chronic microvascular ischemic disease, with multiple remote lacunar infarcts involving the right basal ganglia, right thalamus, and left middle cerebellar peduncle. Electronically Signed   By: Jeannine Boga M.D.   On: 02/03/2020 18:34   US RENAL  ARTERY DUPLEX COMPLETE  Result Date: 02/04/2020 CLINICAL DATA:  Hypertensive urgency. Evaluate for renal artery stenosis. EXAM: RENAL/URINARY TRACT ULTRASOUND RENAL DUPLEX DOPPLER ULTRASOUND COMPARISON:  CT abdomen and pelvis-02/03/2020 FINDINGS: Right Kidney: Normal cortical thickness and size, measuring 11.2 cm in length, however there is diffuse increased cortical echogenicity. Note is made of an approximately 1.7 x 1.7 x 1.5 cm exophytic anechoic cyst arising from the inferior pole of the right kidney (images 31 and 32). No echogenic renal stones. No urinary obstruction. Left Kidney: Normal cortical thickness and size, measuring 11.4 cm in length, however  there is diffuse increased cortical echogenicity. Note is made of an approximately 0.9 x 0.9 x 0.8 cm anechoic lesion arising from the inferior pole the left kidney, too small to adequately characterize though favored to represent a renal cyst. No echogenic renal stones. No urinary obstruction. Bladder:  Appears normal given degree of distention. _________________________________________________________ RENAL DUPLEX ULTRASOUND Right Renal Artery Velocities: Origin:  66 cm/sec Mid:  53 cm/sec [place]:  43 cm/sec Interlobar:  21 cm/sec Arcuate:  23 cm/sec Left Renal Artery Velocities: Origin:  144 cm/sec Mid:  83 cm/sec Hilum:  73 cm/sec Interlobar:  25 cm/sec Arcuate:  22 cm/sec Aortic Velocity:  176 cm/sec Right Renal-Aortic Ratios: Origin: 0.4 Mid:  0.3 Hilum: 0.2 Interlobar: 0.1 Arcuate: 0.1 Left Renal-Aortic Ratios: Origin: 1 0.8 Mid: 0.5 Hilum: 0.2 Interlobar: 0.2 Arcuate: 0.1 Normal velocities and low resistance waveforms are demonstrated throughout the interrogated portions of the bilateral renal arteries and renal parenchyma. Bilateral renal veins appear patent where imaged. Scattered eccentric mixed echogenic plaque is seen throughout the normal caliber abdominal aorta. IMPRESSION: 1. No evidence of renal artery stenosis. 2. Bilateral increased cortical echogenicity without evidence of atrophy, nonspecific though could be seen in the setting medical renal disease. 3. No evidence of urinary obstruction. 4.  Aortic Atherosclerosis (ICD10-I70.0). Electronically Signed   By: Sandi Mariscal M.D.   On: 02/04/2020 12:12        Scheduled Meds: . amLODipine  5 mg Oral Daily  . apixaban  5 mg Oral BID  . cloNIDine  0.1 mg Oral BID  . gabapentin  300 mg Oral QHS  . influenza vaccine adjuvanted  0.5 mL Intramuscular Tomorrow-1000  . isosorbide mononitrate  60 mg Oral Daily  . metoprolol succinate  200 mg Oral Daily  . pantoprazole  40 mg Oral BID  . rosuvastatin  40 mg Oral Daily  . sodium chloride flush  3  mL Intravenous Q12H   Continuous Infusions: . sodium chloride       LOS: 2 days    Time spent: 32 mins     Wyvonnia Dusky, MD [facility] Pager [phone]  If 7PM-7AM, please contact night-coverage 02/05/2020, 8:10 AM

## 2020-02-05 NOTE — Evaluation (Signed)
Occupational Therapy Evaluation Patient Details Name: Casey French MRN: [id number] DOB: [birth year] Today's Date: 02/05/2020    History of Present Illness Pt is a 70 y/o M wtih PMH: HTN, CKD stage III/IV, CAD status post stent in 2003/2017, atrial fibrillation on anticoagulation, anemia to the ED with complaint of substernal chest pain also with associated shortness of breath, nausea and few episodes of nonbloody nonbilious vomiting. Pt with mildly elevated troponin which cardiology note indicates "likely not due to to acute coronary syndrome, more likely due to hypertensive  urgency with systolic blood pressures greater than 220. Pt admitted for hypertensive urgerncy. Pt's BP better controlled at time of therapy evaluation with most recent reading 148/80.   Clinical Impression   Pt seen for OT evaluation this AM in setting of acute hospitalization d/t hypertensive urgency. Pt reports being completing INDEP at baseline with I/ADLs including driving and running his own errands. Pt performs bed mobility and transfers with INDEP with no AD. Pt demos G standing balance. He is able to take ~4-5 steps from bed to chair with no AD and SUPV/MOD I. Pt demos good tolerance for ADL mobility. He is slightly limited by some L hip pain that he reports is chronic and he has upcoming surgery scheduled later this month. Overall, pt tolerates all aspects of assessment well. Safety and OOB education completed with patient and no further OT needs identified. Will complete order at this time.     Follow Up Recommendations  No OT follow up    Equipment Recommendations  None recommended by OT    Recommendations for Other Services       Precautions / Restrictions Precautions Precautions: Fall Restrictions Weight Bearing Restrictions: No      Mobility Bed Mobility Overal bed mobility: Modified Independent                  Transfers Overall transfer level: Modified independent                General transfer comment: increased time, some L hip pain with mobilizing and imperfect step pattern, but overall safe and functional and pt reports this is baseline    Balance Overall balance assessment: Mild deficits observed, not formally tested                                         ADL either performed or assessed with clinical judgement   ADL                                         General ADL Comments: Pt able to perform seated UB ADLs with INDEP, MIN A for LB ADLs d/t some limitations in L hip/chronic pain. Has modified techniques he reports using at home.     Vision Baseline Vision/History:  (had cataract removal sx to R eye) Patient Visual Report: No change from baseline       Perception     Praxis      Pertinent Vitals/Pain Pain Assessment: Faces Faces Pain Scale: Hurts little more Pain Location: L hip, chronic pain, worse with mobilizing. Pain Descriptors / Indicators: Sore;Discomfort Pain Intervention(s): Limited activity within patient's tolerance;Monitored during session     Hand Dominance Right   Extremity/Trunk Assessment Upper Extremity Assessment Upper Extremity Assessment: Overall WFL for tasks assessed;Generalized  weakness (ROM WFL, MMT grossly 4-/5)   Lower Extremity Assessment Lower Extremity Assessment: Defer to PT evaluation;LLE deficits/detail LLE Deficits / Details: some decreased hip flexion and int/ext rotation d/t h/o chronic pain   Cervical / Trunk Assessment Cervical / Trunk Assessment: Normal   Communication Communication Communication: No difficulties   Cognition Arousal/Alertness: Awake/alert Behavior During Therapy: WFL for tasks assessed/performed Overall Cognitive Status: Within Functional Limits for tasks assessed                                     General Comments       Exercises Other Exercises Other Exercises: OT facilitates ed re: role of OT in acute setting,  importance of OOB activity. Pt agreeable and with good understanding.   Shoulder Instructions      Home Living Family/patient expects to be discharged to:: Private residence Living Arrangements: Spouse/significant other Available Help at Discharge: Family;Available PRN/intermittently (pt's spouse works in admissions at [employer] and isa student as well.) Type of Home: House Home Access: Stairs to enter [employer] of Steps: 4 in the front, 6-7 in the back entrance. Entrance Stairs-Rails: Can reach both Home Layout: One level               Home Equipment: Cane - single point   Additional Comments: got Se [facility] for left hip replacement with Dr. Rudene Christians that was supposed to happen 02/21/20      Prior Functioning/Environment Level of Independence: Independent        Comments: +drives and runs errands. endorses 1-2 falls in last 6 months in which he "stood up wrong and felt my left hip pop)        OT Problem List: Decreased strength;Pain      OT Treatment/Interventions: Self-care/ADL training;Therapeutic activities    OT Goals(Current goals can be found in the care plan section) Acute Rehab OT Goals Patient Stated Goal: to just feel better OT Goal Formulation: All assessment and education complete, DC therapy  OT Frequency:     Barriers to D/C:            Co-evaluation              AM-PAC OT "6 Clicks" Daily Activity     Outcome Measure Help from another person eating meals?: None Help from another person taking care of personal grooming?: None Help from another person toileting, which includes using toliet, bedpan, or urinal?: None Help from another person bathing (including washing, rinsing, drying)?: None Help from another person to put on and taking off regular upper body clothing?: None Help from another person to put on and taking off regular lower body clothing?: None 6 Click Score: 24   End of Session Nurse Communication: Mobility status  Activity  Tolerance: Patient tolerated treatment well Patient left: in chair;with call bell/phone within reach  OT Visit Diagnosis: Muscle weakness (generalized) (M62.81)                Time: 6629-4765 OT Time Calculation (min): 32 min Charges:  OT General Charges $OT Visit: 1 Visit OT Evaluation $OT Eval Low Complexity: 1 Low OT Treatments $Self Care/Home Management : 8-22 mins $Therapeutic Activity: 8-22 mins  Gerrianne Scale, MS, OTR/L ascom [phone] 02/05/20, 10:30 AM

## 2020-02-06 LAB — BASIC METABOLIC PANEL
Anion gap: 5 (ref 5–15)
BUN: 43 mg/dL — ABNORMAL HIGH (ref 8–23)
CO2: 21 mmol/L — ABNORMAL LOW (ref 22–32)
Calcium: 6.9 mg/dL — ABNORMAL LOW (ref 8.9–10.3)
Chloride: 109 mmol/L (ref 98–111)
Creatinine, Ser: 4 mg/dL — ABNORMAL HIGH (ref 0.61–1.24)
GFR, Estimated: 16 mL/min — ABNORMAL LOW (ref >60)
Glucose, Bld: 90 mg/dL (ref 70–99)
Potassium: 3.2 mmol/L — ABNORMAL LOW (ref 3.5–5.1)
Sodium: 135 mmol/L (ref 135–145)

## 2020-02-06 LAB — CBC
HCT: 22.6 % — ABNORMAL LOW (ref 39.0–52.0)
Hemoglobin: 7.5 g/dL — ABNORMAL LOW (ref 13.0–17.0)
MCH: 31.4 pg (ref 26.0–34.0)
MCHC: 33.2 g/dL (ref 30.0–36.0)
MCV: 94.6 fL (ref 80.0–100.0)
Platelets: 142 10*3/uL — ABNORMAL LOW (ref 150–400)
RBC: 2.39 MIL/uL — ABNORMAL LOW (ref 4.22–5.81)
RDW: 13.8 % (ref 11.5–15.5)
WBC: 9.3 10*3/uL (ref 4.0–10.5)
nRBC: 0 % (ref 0.0–0.2)

## 2020-02-06 LAB — PROTIME-INR
INR: 1.3 — ABNORMAL HIGH (ref 0.8–1.2)
Prothrombin Time: 15.2 seconds (ref 11.4–15.2)

## 2020-02-06 LAB — ANCA TITERS
Atypical P-ANCA titer: <1:20 {titer}
C-ANCA: <1:20 {titer}
P-ANCA: <1:20 {titer}

## 2020-02-06 NOTE — Progress Notes (Signed)
PROGRESS NOTE    Casey French  VHQ:[id number] DOB: [birth year] DOA: 02/03/2020 PCP: [name], MD    Assessment & Plan:   Principal Problem:   Hypertensive urgency Active Problems:   AKI (acute kidney injury) ([place])   Benign essential HTN   DM type 2 (diabetes mellitus, type 2) ([place])   CAD (coronary artery disease)   Atrial fibrillation, chronic ([place])   Chronic kidney disease   Hypertensive emergency: resolved but still has HTN. Continue on clonidine, amlodipine, metoprolol, imdur   Chest pain: with mildly elevated high sensitive troponin which is likely secondary to demand ischemia. Resolved  CAD: s/p stent. Continue on metoprolol, eliquis, imdur & statin.  PAF: continue on metoprolol, eliquis   AKI on CKD stage IV: baseline Cr 2.23. Cr is trending down slightly today. Kidney biopsy 02/07/20 & NPO after midnight as per nephro.  Nephro following and recs apprec  Thrombocytopenia: etiology unclear. Will continue to monitor   Hypokalemia: will hold off replacement as Cr/GFR are elevated. Will continue to monitor   ACD: likely secondary to CKD. Will transfuse if Hb <7.0   DM2: well controlled w/ HbA1c 6.0. Not on any po anti-DM meds at home   DVT prophylaxis: eliquis  Code Status: full  Family Communication:  Disposition Plan: likely d/c back home  Status is: Inpatient  Remains inpatient appropriate because:IV treatments appropriate due to intensity of illness or inability to take PO and Inpatient level of care appropriate due to severity of illness, kidney biopsy tomorrow    Dispo: The patient is from: Home              Anticipated d/c is to: Home              Anticipated d/c date is: 2 days              Patient currently is not medically stable to d/c.      Consultants:   nephro    Procedures:    Antimicrobials:    Subjective: Pt c/o fatigue   Objective: Vitals:   02/05/20 1531 02/05/20 2040 02/05/20 2319 02/06/20 0455  BP: 118/69  (!) 150/81 125/78 130/71  Pulse: (!) 58 60 65 71  Resp: (!) 24 16 16 16   Temp: 98 F (36.7 C) (!) 97.4 F (36.3 C) 97.6 F (36.4 C) 98.8 F (37.1 C)  TempSrc:      SpO2: 97% 97% 96% 96%  Weight:      Height:       No intake or output data in the 24 hours ending 02/06/20 0744 Filed Weights   02/02/20 2236  Weight: 70.3 kg    Examination:  General exam: Appears calm & comfortable  Respiratory system: clear breath sounds b/l. No wheezes, rales Cardiovascular system: S1/S2+. No rubs or gallops Gastrointestinal system: Abd is soft, NT, ND & hypoactive bowel sounds  Central nervous system: Alert and oriented. Moves all 4 extremities  Psychiatry: Judgement and insight appear normal. Flat mood and affect     Data Reviewed: I have personally reviewed following labs and imaging studies  CBC: Recent Labs  Lab 02/02/20 2240 02/04/20 0741 02/05/20 0656 02/06/20 0259  WBC 13.5* 10.5 10.1 9.3  HGB 9.4* 8.0* 8.2* 7.5*  HCT 28.7* 23.7* 24.6* 22.6*  MCV 95.3 92.9 95.3 94.6  PLT 233 162 163 413*   Basic Metabolic Panel: Recent Labs  Lab 02/02/20 2240 02/04/20 0741 02/05/20 0656 02/06/20 0259  NA 141 139 138 135  K 3.6  3.4* 3.5 3.2*  CL 112* 110 109 109  CO2 16* 19* 16* 21*  GLUCOSE 128* 91 71 90  BUN 25* 40* 48* 43*  CREATININE 2.66* 3.58* 4.02* 4.00*  CALCIUM 7.6* 7.0* 7.4* 6.9*   GFR: Estimated Creatinine Clearance: 17.6 mL/min (A) (by C-G formula based on SCr of 4 mg/dL (H)). Liver Function Tests: Recent Labs  Lab 02/02/20 2240  AST 23  ALT 13  ALKPHOS 99  BILITOT 1.1  PROT 5.9*  ALBUMIN 2.1*   Recent Labs  Lab 02/03/20 0531  LIPASE 25   No results for input(s): AMMONIA in the last 168 hours. Coagulation Profile: No results for input(s): INR, PROTIME in the last 168 hours. Cardiac Enzymes: No results for input(s): CKTOTAL, CKMB, CKMBINDEX, TROPONINI in the last 168 hours. BNP (last 3 results) No results for input(s): PROBNP in the last 8760  hours. HbA1C: No results for input(s): HGBA1C in the last 72 hours. CBG: No results for input(s): GLUCAP in the last 168 hours. Lipid Profile: No results for input(s): CHOL, HDL, LDLCALC, TRIG, CHOLHDL, LDLDIRECT in the last 72 hours. Thyroid Function Tests: No results for input(s): TSH, T4TOTAL, FREET4, T3FREE, THYROIDAB in the last 72 hours. Anemia Panel: No results for input(s): VITAMINB12, FOLATE, FERRITIN, TIBC, IRON, RETICCTPCT in the last 72 hours. Sepsis Labs: No results for input(s): PROCALCITON, LATICACIDVEN in the last 168 hours.  Recent Results (from the past 240 hour(s))  Resp Panel by RT-PCR (Flu A&B, Covid) Nasopharyngeal Swab     Status: None   Collection Time: 02/03/20  7:36 AM   Specimen: Nasopharyngeal Swab; Nasopharyngeal(NP) swabs in vial transport medium  Result Value Ref Range Status   SARS Coronavirus 2 by RT PCR NEGATIVE NEGATIVE Final    Comment: (NOTE) SARS-CoV-2 target nucleic acids are NOT DETECTED.  The SARS-CoV-2 RNA is generally detectable in upper respiratory specimens during the acute phase of infection. The lowest concentration of SARS-CoV-2 viral copies this assay can detect is 138 copies/mL. A negative result does not preclude SARS-Cov-2 infection and should not be used as the sole basis for treatment or other patient management decisions. A negative result may occur with  improper specimen collection/handling, submission of specimen other than nasopharyngeal swab, presence of viral mutation(s) within the areas targeted by this assay, and inadequate number of viral copies(<138 copies/mL). A negative result must be combined with clinical observations, patient history, and epidemiological information. The expected result is Negative.  Fact Sheet for Patients:  EntrepreneurPulse.com.au  Fact Sheet for Healthcare Providers:  IncredibleEmployment.be  This test is no t yet approved or cleared by the [place] FDA and  has been authorized for detection and/or diagnosis of SARS-CoV-2 by FDA under an Emergency Use Authorization (EUA). This EUA will remain  in effect (meaning this test can be used) for the duration of the COVID-19 declaration under Section 564(b)(1) of the Act, 21 U.S.C.section 360bbb-3(b)(1), unless the authorization is terminated  or revoked sooner.       Influenza A by PCR NEGATIVE NEGATIVE Final   Influenza B by PCR NEGATIVE NEGATIVE Final    Comment: (NOTE) The Xpert Xpress SARS-CoV-2/FLU/RSV plus assay is intended as an aid in the diagnosis of influenza from Nasopharyngeal swab specimens and should not be used as a sole basis for treatment. Nasal washings and aspirates are unacceptable for Xpert Xpress SARS-CoV-2/FLU/RSV testing.  Fact Sheet for Patients: EntrepreneurPulse.com.au  Fact Sheet for Healthcare Providers: IncredibleEmployment.be  This test is not yet approved or cleared by the [place]  States FDA and has been authorized for detection and/or diagnosis of SARS-CoV-2 by FDA under an Emergency Use Authorization (EUA). This EUA will remain in effect (meaning this test can be used) for the duration of the COVID-19 declaration under Section 564(b)(1) of the Act, 21 U.S.C. section 360bbb-3(b)(1), unless the authorization is terminated or revoked.  Performed at [facility], [address], [place], [place] [address]          Radiology Studies: US RENAL ARTERY DUPLEX COMPLETE  Result Date: 02/04/2020 CLINICAL DATA:  Hypertensive urgency. Evaluate for renal artery stenosis. EXAM: RENAL/URINARY TRACT ULTRASOUND RENAL DUPLEX DOPPLER ULTRASOUND COMPARISON:  CT abdomen and pelvis-02/03/2020 FINDINGS: Right Kidney: Normal cortical thickness and size, measuring 11.2 cm in length, however there is diffuse increased cortical echogenicity. Note is made of an approximately 1.7 x 1.7 x 1.5 cm exophytic anechoic cyst  arising from the inferior pole of the right kidney (images 31 and 32). No echogenic renal stones. No urinary obstruction. Left Kidney: Normal cortical thickness and size, measuring 11.4 cm in length, however there is diffuse increased cortical echogenicity. Note is made of an approximately 0.9 x 0.9 x 0.8 cm anechoic lesion arising from the inferior pole the left kidney, too small to adequately characterize though favored to represent a renal cyst. No echogenic renal stones. No urinary obstruction. Bladder:  Appears normal given degree of distention. _________________________________________________________ RENAL DUPLEX ULTRASOUND Right Renal Artery Velocities: Origin:  66 cm/sec Mid:  53 cm/sec Hil[place]:  43 cm/sec Interlobar:  21 cm/sec Arcuate:  23 cm/sec Left Renal Artery Velocities: Origin:  144 cm/sec Mid:  83 cm/sec Hilum:  73 cm/sec Interlobar:  25 cm/sec Arcuate:  22 cm/sec Aortic Velocity:  176 cm/sec Right Renal-Aortic Ratios: Origin: 0.4 Mid:  0.3 Hilum: 0.2 Interlobar: 0.1 Arcuate: 0.1 Left Renal-Aortic Ratios: Origin: 1 0.8 Mid: 0.5 Hilum: 0.2 Interlobar: 0.2 Arcuate: 0.1 Normal velocities and low resistance waveforms are demonstrated throughout the interrogated portions of the bilateral renal arteries and renal parenchyma. Bilateral renal veins appear patent where imaged. Scattered eccentric mixed echogenic plaque is seen throughout the normal caliber abdominal aorta. IMPRESSION: 1. No evidence of renal artery stenosis. 2. Bilateral increased cortical echogenicity without evidence of atrophy, nonspecific though could be seen in the setting medical renal disease. 3. No evidence of urinary obstruction. 4.  Aortic Atherosclerosis (ICD10-I70.0). Electronically Signed   By: Sandi Mariscal M.D.   On: 02/04/2020 12:12   ECHOCARDIOGRAM COMPLETE  Result Date: 02/05/2020    ECHOCARDIOGRAM REPORT   Patient Name:   Casey French Date of Exam: 02/04/2020 Medical Rec #:  [id number]           Height:       69.0 in  Accession #:    [id number]          Weight:       155.0 lb Date of Birth:  [birth year]           BSA:          1.854 m Patient Age:    68 years            BP:           143/81 mmHg Patient Gender: M                   HR:           74 bpm. Exam Location:  [facility] Procedure: 2D Echo, Color Doppler and Cardiac Doppler Indications:     Chest  pain R07.9  History:         Patient has no prior history of Echocardiogram examinations.                  Previous Myocardial Infarction; Risk Factors:Diabetes and                  Hypertension.  Sonographer:     Sherrie Sport RDCS (AE) Referring Phys:  [place] Diagnosing Phys: Yolonda Kida MD IMPRESSIONS  1. Left ventricular ejection fraction, by estimation, is 60 to 65%. The left ventricle has normal function. The left ventricle has no regional wall motion abnormalities. Left ventricular diastolic parameters were normal.  2. Right ventricular systolic function is normal. The right ventricular size is normal.  3. The mitral valve is grossly normal. Trivial mitral valve regurgitation.  4. The aortic valve is normal in structure. Aortic valve regurgitation is not visualized. FINDINGS  Left Ventricle: Left ventricular ejection fraction, by estimation, is 60 to 65%. The left ventricle has normal function. The left ventricle has no regional wall motion abnormalities. The left ventricular internal cavity size was normal in size. There is  borderline left ventricular hypertrophy. Left ventricular diastolic parameters were normal. Right Ventricle: The right ventricular size is normal. No increase in right ventricular wall thickness. Right ventricular systolic function is normal. Left Atrium: Left atrial size was normal in size. Right Atrium: Right atrial size was normal in size. Pericardium: Trivial pericardial effusion is present. Mitral Valve: The mitral valve is grossly normal. There is mild thickening of the mitral valve leaflet(s). There is mild calcification of the  mitral valve leaflet(s). Mild mitral annular calcification. Trivial mitral valve regurgitation. Tricuspid Valve: The tricuspid valve is grossly normal. Tricuspid valve regurgitation is trivial. Aortic Valve: The aortic valve is normal in structure. Aortic valve regurgitation is not visualized. Aortic valve mean gradient measures 7.0 mmHg. Aortic valve peak gradient measures 10.1 mmHg. Aortic valve area, by VTI measures 2.97 cm. Pulmonic Valve: The pulmonic valve was normal in structure. Pulmonic valve regurgitation is not visualized. Aorta: The ascending aorta was not well visualized. IAS/Shunts: No atrial level shunt detected by color flow Doppler.  LEFT VENTRICLE PLAX 2D LVIDd:         3.68 cm  Diastology LVIDs:         2.57 cm  LV e' medial:    7.51 cm/s LV PW:         1.37 cm  LV E/e' medial:  12.5 LV IVS:        1.10 cm  LV e' lateral:   6.96 cm/s LVOT diam:     2.10 cm  LV E/e' lateral: 13.5 LV SV:         103 LV SV Index:   56 LVOT Area:     3.46 cm  RIGHT VENTRICLE RV Basal diam:  2.69 cm RV S prime:     16.00 cm/s TAPSE (M-mode): 4.1 cm LEFT ATRIUM             Index       RIGHT ATRIUM           Index LA diam:        3.60 cm 1.94 cm/m  RA Area:     18.80 cm LA Vol (A2C):   67.5 ml 36.41 ml/m RA Volume:   45.90 ml  24.76 ml/m LA Vol (A4C):   57.4 ml 30.96 ml/m LA Biplane Vol: 65.5 ml 35.33 ml/m  AORTIC  VALVE                    PULMONIC VALVE AV Area (Vmax):    2.88 cm     PV Vmax:        0.88 m/s AV Area (Vmean):   2.60 cm     PV Peak grad:   3.1 mmHg AV Area (VTI):     2.97 cm     RVOT Peak grad: 3 mmHg AV Vmax:           159.00 cm/s AV Vmean:          127.000 cm/s AV VTI:            0.347 m AV Peak Grad:      10.1 mmHg AV Mean Grad:      7.0 mmHg LVOT Vmax:         132.00 cm/s LVOT Vmean:        95.500 cm/s LVOT VTI:          0.298 m LVOT/AV VTI ratio: 0.86  AORTA Ao Root diam: 3.80 cm MITRAL VALVE               TRICUSPID VALVE MV Area (PHT): 3.28 cm    TR Peak grad:   22.7 mmHg MV Decel Time:  231 msec    TR Vmax:        238.00 cm/s MV E velocity: 93.80 cm/s MV A velocity: 76.30 cm/s  SHUNTS MV E/A ratio:  1.23        Systemic VTI:  0.30 m                            Systemic Diam: 2.10 cm Dwayne Prince Rome MD Electronically signed by Yolonda Kida MD Signature Date/Time: 02/05/2020/3:12:37 PM    Final         Scheduled Meds: . amLODipine  5 mg Oral Daily  . cloNIDine  0.1 mg Oral BID  . feeding supplement  1 Container Oral TID BM  . gabapentin  300 mg Oral QHS  . influenza vaccine adjuvanted  0.5 mL Intramuscular Tomorrow-1000  . isosorbide mononitrate  60 mg Oral Daily  . metoprolol succinate  200 mg Oral Daily  . pantoprazole  40 mg Oral BID  . rosuvastatin  10 mg Oral Daily  . sodium chloride flush  3 mL Intravenous Q12H   Continuous Infusions: . sodium chloride       LOS: 3 days    Time spent: 33 mins     Wyvonnia Dusky, MD [facility] Pager [phone]  If 7PM-7AM, please contact night-coverage 02/06/2020, 7:44 AM

## 2020-02-06 NOTE — Care Management Important Message (Signed)
Important Message  Patient Details  Name: BRADIE LACOCK MRN: [id number] Date of Birth: 1950/06/18   Medicare Important Message Given:  Yes     Juliann Pulse A Darrin Koman 02/06/2020, 10:47 AM

## 2020-02-06 NOTE — Plan of Care (Signed)

## 2020-02-06 NOTE — Progress Notes (Signed)
[facility], [place] 02/06/20  Subjective:   LOS: 3 No intake/output data recorded.  Patient known to our practice from outpatient follow-up.  He was last seen by Dr. Juleen China in October 2021.  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  He has underlying problems of hypertension, diabetes and nonsteroidal use.  He was started on torsemide in October but not sure if he is taking it.  He presents to the emergency room today for chest pain and elevated blood pressure.  He also reported vomiting and feeling pale.  Initial blood pressure of 221/104.  Blood sugar of 146.  Patient was given IV labetalol with some improvement in blood pressure of 180/96.  Patient is also noted to have increased creatinine of 2.66.  His baseline appears to be 2.23 from November 15, 2019.  Outpatient urine protein to creatinine ratio of 15 g.  Hemoglobin A1c of 6.2%.  Today, patient overall feels better Blood pressure control has improved Serum creatinine appears to have peaked Able to eat without nausea and vomiting    Objective:  Vital signs in last 24 hours:  Temp:  [97.4 F (36.3 C)-98.8 F (37.1 C)] 98 F (36.7 C) (01/12 1155) Pulse Rate:  [60-71] 67 (01/12 1155) Resp:  [15-16] 15 (01/12 1155) BP: (113-150)/(67-81) 113/67 (01/12 1155) SpO2:  [96 %-97 %] 97 % (01/12 1155)  Weight change:  Filed Weights   02/02/20 2236  Weight: 70.3 kg    Intake/Output:   No intake or output data in the 24 hours ending 02/06/20 1555  Physical Exam: General:  No acute distress, laying in the bed  HEENT  anicteric, moist oral mucous membrane  Pulm/lungs  normal breathing effort, clear to auscultation  CVS/Heart  irregular rhythm, no rub or gallop  Abdomen:   Soft, nontender  Extremities:  No peripheral edema  Neurologic:  Alert, oriented, able to follow commands  Skin:  No acute rashes    Basic Metabolic Panel:  Recent Labs  Lab 02/02/20 2240  02/04/20 0741 02/05/20 0656 02/06/20 0259  NA 141 139 138 135  K 3.6 3.4* 3.5 3.2*  CL 112* 110 109 109  CO2 16* 19* 16* 21*  GLUCOSE 128* 91 71 90  BUN 25* 40* 48* 43*  CREATININE 2.66* 3.58* 4.02* 4.00*  CALCIUM 7.6* 7.0* 7.4* 6.9*     CBC: Recent Labs  Lab 02/02/20 2240 02/04/20 0741 02/05/20 0656 02/06/20 0259  WBC 13.5* 10.5 10.1 9.3  HGB 9.4* 8.0* 8.2* 7.5*  HCT 28.7* 23.7* 24.6* 22.6*  MCV 95.3 92.9 95.3 94.6  PLT 233 162 163 142*     No results found for: HEPBSAG, HEPBSAB, HEPBIGM    Microbiology:  Recent Results (from the past 240 hour(s))  Resp Panel by RT-PCR (Flu A&B, Covid) Nasopharyngeal Swab     Status: None   Collection Time: 02/03/20  7:36 AM   Specimen: Nasopharyngeal Swab; Nasopharyngeal(NP) swabs in vial transport medium  Result Value Ref Range Status   SARS Coronavirus 2 by RT PCR NEGATIVE NEGATIVE Final    Comment: (NOTE) SARS-CoV-2 target nucleic acids are NOT DETECTED.  The SARS-CoV-2 RNA is generally detectable in upper respiratory specimens during the acute phase of infection. The lowest concentration of SARS-CoV-2 viral copies this assay can detect is 138 copies/mL. A negative result does not preclude SARS-Cov-2 infection and should not be used as the sole basis for treatment or other patient management decisions. A negative result may occur with  improper  specimen collection/handling, submission of specimen other than nasopharyngeal swab, presence of viral mutation(s) within the areas targeted by this assay, and inadequate number of viral copies(<138 copies/mL). A negative result must be combined with clinical observations, patient history, and epidemiological information. The expected result is Negative.  Fact Sheet for Patients:  EntrepreneurPulse.com.au  Fact Sheet for Healthcare Providers:  IncredibleEmployment.be  This test is no t yet approved or cleared by the [place] FDA and   has been authorized for detection and/or diagnosis of SARS-CoV-2 by FDA under an Emergency Use Authorization (EUA). This EUA will remain  in effect (meaning this test can be used) for the duration of the COVID-19 declaration under Section 564(b)(1) of the Act, 21 U.S.C.section 360bbb-3(b)(1), unless the authorization is terminated  or revoked sooner.       Influenza A by PCR NEGATIVE NEGATIVE Final   Influenza B by PCR NEGATIVE NEGATIVE Final    Comment: (NOTE) The Xpert Xpress SARS-CoV-2/FLU/RSV plus assay is intended as an aid in the diagnosis of influenza from Nasopharyngeal swab specimens and should not be used as a sole basis for treatment. Nasal washings and aspirates are unacceptable for Xpert Xpress SARS-CoV-2/FLU/RSV testing.  Fact Sheet for Patients: EntrepreneurPulse.com.au  Fact Sheet for Healthcare Providers: IncredibleEmployment.be  This test is not yet approved or cleared by the [place] FDA and has been authorized for detection and/or diagnosis of SARS-CoV-2 by FDA under an Emergency Use Authorization (EUA). This EUA will remain in effect (meaning this test can be used) for the duration of the COVID-19 declaration under Section 564(b)(1) of the Act, 21 U.S.C. section 360bbb-3(b)(1), unless the authorization is terminated or revoked.  Performed at [facility], [address] [place],  [address]     Coagulation Studies: Recent Labs    02/06/20 0950  LABPROT 15.2  INR 1.3*    Urinalysis: No results for input(s): COLORURINE, LABSPEC, PHURINE, GLUCOSEU, HGBUR, BILIRUBINUR, KETONESUR, PROTEINUR, UROBILINOGEN, NITRITE, LEUKOCYTESUR in the last 72 hours.  Invalid input(s): APPERANCEUR    Imaging: No results found.   Medications:    Scheduled Meds: . amLODipine  5 mg Oral Daily  . cloNIDine  0.1 mg Oral BID  . feeding supplement  1 Container Oral TID BM  . gabapentin  300 mg Oral QHS  .  influenza vaccine adjuvanted  0.5 mL Intramuscular Tomorrow-1000  . isosorbide mononitrate  60 mg Oral Daily  . metoprolol succinate  200 mg Oral Daily  . pantoprazole  40 mg Oral BID  . rosuvastatin  10 mg Oral Daily  . sodium chloride flush  3 mL Intravenous Q12H   Continuous Infusions: . sodium chloride     PRN Meds:.sodium chloride, acetaminophen, labetalol, ondansetron (ZOFRAN) IV, oxyCODONE, promethazine, sodium chloride flush    Assessment/ Plan:  70 y.o. male with coronary artery disease, history of STEMI with stent in October 2003, PCI 2017.  Atrial fibrillation, diabetes, gout, chronic kidney disease  Principal Problem:   Hypertensive urgency Active Problems:   AKI (acute kidney injury) ([place])   Benign essential HTN   DM type 2 (diabetes mellitus, type 2) ([facility])   CAD (coronary artery disease)   Atrial fibrillation, chronic ([place])   Chronic kidney disease   #.  Acute kidney injury with CKD st 4 with baseline creatinine of 2.23, GFR 29 in October 2021.  Presenting creatinine of 2.66 Recent Labs    02/02/20 2240 02/04/20 0741 02/05/20 0656 02/06/20 0259  CREATININE 2.66* 3.58* 4.02* 4.00*  AKI likely secondary to  accelerated hypertension Agree with restarting home medications Blood pressure control has improved overall   #Proteinuria Urine protein to creatinine ratio 15 g in October 2021 Serum kappa lambda ratio of 1.35, SPEP negative in October 2021 Screening serologies ordered, ANA neg, complements normal Proteinuria and CKD not explained by hypertension and diabetes. Renal biopsy planned for Thursday morning by radiology   #. Anemia of CKD  Lab Results  Component Value Date   HGB 7.5 (L) 02/06/2020    #. HTN with CKD Current regimen includes amlodipine 5 mg daily Clonidine 0.1 mg twice a day Isosorbide 60 mg daily Metoprolol 200 mg daily Renal artery duplex exam neg.   #. Diabetes type 2 with CKD Hemoglobin A1C (%)  Date Value  09/27/2012 6.4  (H)   Hgb A1c MFr Bld (%)  Date Value  05/15/2019 6.0 (H)  Rosuvastatin 40 mg daily for cardiovascular risk reduction  #Atrial fibrillation Anticoagulation with Eliquis On hold for renal biopsy   Patient can be discharged on Thursday afternoon if no complications during biopsy.     LOS: [place] 1/12/20223:55 PM  [place], [place]

## 2020-02-07 ENCOUNTER — Inpatient Hospital Stay: Payer: Medicare PPO

## 2020-02-07 DIAGNOSIS — I10 Essential (primary) hypertension: Secondary | ICD-10-CM

## 2020-02-07 LAB — CBC
HCT: 24.6 % — ABNORMAL LOW (ref 39.0–52.0)
Hemoglobin: 8.4 g/dL — ABNORMAL LOW (ref 13.0–17.0)
MCH: 31.6 pg (ref 26.0–34.0)
MCHC: 34.1 g/dL (ref 30.0–36.0)
MCV: 92.5 fL (ref 80.0–100.0)
Platelets: 159 10*3/uL (ref 150–400)
RBC: 2.66 MIL/uL — ABNORMAL LOW (ref 4.22–5.81)
RDW: 13.9 % (ref 11.5–15.5)
WBC: 8.2 10*3/uL (ref 4.0–10.5)
nRBC: 0 % (ref 0.0–0.2)

## 2020-02-07 LAB — BASIC METABOLIC PANEL
Anion gap: 6 (ref 5–15)
BUN: 40 mg/dL — ABNORMAL HIGH (ref 8–23)
CO2: 21 mmol/L — ABNORMAL LOW (ref 22–32)
Calcium: 7.3 mg/dL — ABNORMAL LOW (ref 8.9–10.3)
Chloride: 109 mmol/L (ref 98–111)
Creatinine, Ser: 3.78 mg/dL — ABNORMAL HIGH (ref 0.61–1.24)
GFR, Estimated: 17 mL/min — ABNORMAL LOW (ref >60)
Glucose, Bld: 110 mg/dL — ABNORMAL HIGH (ref 70–99)
Potassium: 2.9 mmol/L — ABNORMAL LOW (ref 3.5–5.1)
Sodium: 136 mmol/L (ref 135–145)

## 2020-02-07 MED ORDER — POTASSIUM CHLORIDE CRYS ER 20 MEQ PO TBCR
20.0000 meq | EXTENDED_RELEASE_TABLET | Freq: Once | ORAL | Status: AC
  Administered 2020-02-07: 20 meq via ORAL
  Filled 2020-02-07: qty 1

## 2020-02-07 MED ORDER — FENTANYL CITRATE (PF) 100 MCG/2ML IJ SOLN
INTRAMUSCULAR | Status: AC
  Filled 2020-02-07: qty 2

## 2020-02-07 MED ORDER — MIDAZOLAM HCL 2 MG/2ML IJ SOLN
INTRAMUSCULAR | Status: DC | PRN
  Administered 2020-02-07 (×2): 1 mg via INTRAVENOUS

## 2020-02-07 MED ORDER — MIDAZOLAM HCL 2 MG/2ML IJ SOLN
INTRAMUSCULAR | Status: AC
  Filled 2020-02-07: qty 2

## 2020-02-07 MED ORDER — FENTANYL CITRATE (PF) 100 MCG/2ML IJ SOLN
INTRAMUSCULAR | Status: DC | PRN
  Administered 2020-02-07 (×2): 50 ug via INTRAVENOUS

## 2020-02-07 NOTE — Progress Notes (Signed)
Patient clinically stable post Kidney biopsy per Dr Anselm Pancoast, tolerated well. Vitals stable at this time, awake/alert and oriented post procedure. Report given to [facility] post procedure. Denies complaints at this time. Received Versed 2 mg along with Fentanyl 100 Mcg IV for procedure.

## 2020-02-07 NOTE — Procedures (Signed)
Interventional Radiology Procedure:   Indications: AKI and proteinuria  Procedure: US guided random renal biopsy  Findings: 2 cores from left kidney lower pole, no immediate bleeding or hematoma.  Complications: None     EBL: Minimal  Plan: Strict bedrest 4 hours   Selena Swaminathan R. Anselm Pancoast, MD  Pager: [phone]

## 2020-02-07 NOTE — Plan of Care (Signed)

## 2020-02-07 NOTE — Progress Notes (Signed)
[facility], [place] 02/07/20  Subjective:   LOS: 4 01/12 0701 - 01/13 0700 In: -  Out: 200 [Urine:200]  Patient known to our practice from outpatient follow-up.  He was last seen by Dr. Juleen China in October 2021.  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  He has underlying problems of hypertension, diabetes and nonsteroidal use.  He was started on torsemide in October but not sure if he is taking it.  He presents to the emergency room today for chest pain and elevated blood pressure.  He also reported vomiting and feeling pale.  Initial blood pressure of 221/104.  Blood sugar of 146.  Patient was given IV labetalol with some improvement in blood pressure of 180/96.  Patient is also noted to have increased creatinine of 2.66.  His baseline appears to be 2.23 from November 15, 2019.  Outpatient urine protein to creatinine ratio of 15 g.  Hemoglobin A1c of 6.2%.  1/13-no acute events.  Remains on room air.  Able to eat without nausea or vomiting.  Biopsy scheduled for later today hypokalemia noted.  Patient is getting oral potassium supplementation     Objective:  Vital signs in last 24 hours:  Temp:  [97.7 F (36.5 C)-98.3 F (36.8 C)] 98.1 F (36.7 C) (01/13 0743) Pulse Rate:  [64-75] 64 (01/13 0743) Resp:  [15-18] 16 (01/13 0743) BP: (113-149)/(67-87) 135/82 (01/13 0743) SpO2:  [94 %-97 %] 94 % (01/13 0743)  Weight change:  Filed Weights   02/02/20 2236  Weight: 70.3 kg    Intake/Output:    Intake/Output Summary (Last 24 hours) at 02/07/2020 1023 Last data filed at 02/06/2020 2319 Gross per 24 hour  Intake --  Output 200 ml  Net -200 ml    Physical Exam: General:  No acute distress, laying in the bed  HEENT  anicteric, moist oral mucous membrane  Pulm/lungs  normal breathing effort, clear to auscultation  CVS/Heart  irregular rhythm, no rub or gallop  Abdomen:   Soft, nontender  Extremities:  No peripheral edema   Neurologic:  Alert, oriented, able to follow commands  Skin:  No acute rashes    Basic Metabolic Panel:  Recent Labs  Lab 02/02/20 2240 02/04/20 0741 02/05/20 0656 02/06/20 0259 02/07/20 0239  NA 141 139 138 135 136  K 3.6 3.4* 3.5 3.2* 2.9*  CL 112* 110 109 109 109  CO2 16* 19* 16* 21* 21*  GLUCOSE 128* 91 71 90 110*  BUN 25* 40* 48* 43* 40*  CREATININE 2.66* 3.58* 4.02* 4.00* 3.78*  CALCIUM 7.6* 7.0* 7.4* 6.9* 7.3*     CBC: Recent Labs  Lab 02/02/20 2240 02/04/20 0741 02/05/20 0656 02/06/20 0259 02/07/20 0239  WBC 13.5* 10.5 10.1 9.3 8.2  HGB 9.4* 8.0* 8.2* 7.5* 8.4*  HCT 28.7* 23.7* 24.6* 22.6* 24.6*  MCV 95.3 92.9 95.3 94.6 92.5  PLT 233 162 163 142* 159     No results found for: HEPBSAG, HEPBSAB, HEPBIGM    Microbiology:  Recent Results (from the past 240 hour(s))  Resp Panel by RT-PCR (Flu A&B, Covid) Nasopharyngeal Swab     Status: None   Collection Time: 02/03/20  7:36 AM   Specimen: Nasopharyngeal Swab; Nasopharyngeal(NP) swabs in vial transport medium  Result Value Ref Range Status   SARS Coronavirus 2 by RT PCR NEGATIVE NEGATIVE Final    Comment: (NOTE) SARS-CoV-2 target nucleic acids are NOT DETECTED.  The SARS-CoV-2 RNA is generally detectable in upper respiratory  specimens during the acute phase of infection. The lowest concentration of SARS-CoV-2 viral copies this assay can detect is 138 copies/mL. A negative result does not preclude SARS-Cov-2 infection and should not be used as the sole basis for treatment or other patient management decisions. A negative result may occur with  improper specimen collection/handling, submission of specimen other than nasopharyngeal swab, presence of viral mutation(s) within the areas targeted by this assay, and inadequate number of viral copies(<138 copies/mL). A negative result must be combined with clinical observations, patient history, and epidemiological information. The expected result is  Negative.  Fact Sheet for Patients:  EntrepreneurPulse.com.au  Fact Sheet for Healthcare Providers:  IncredibleEmployment.be  This test is no t yet approved or cleared by the [place] FDA and  has been authorized for detection and/or diagnosis of SARS-CoV-2 by FDA under an Emergency Use Authorization (EUA). This EUA will remain  in effect (meaning this test can be used) for the duration of the COVID-19 declaration under Section 564(b)(1) of the Act, 21 U.S.C.section 360bbb-3(b)(1), unless the authorization is terminated  or revoked sooner.       Influenza A by PCR NEGATIVE NEGATIVE Final   Influenza B by PCR NEGATIVE NEGATIVE Final    Comment: (NOTE) The Xpert Xpress SARS-CoV-2/FLU/RSV plus assay is intended as an aid in the diagnosis of influenza from Nasopharyngeal swab specimens and should not be used as a sole basis for treatment. Nasal washings and aspirates are unacceptable for Xpert Xpress SARS-CoV-2/FLU/RSV testing.  Fact Sheet for Patients: EntrepreneurPulse.com.au  Fact Sheet for Healthcare Providers: IncredibleEmployment.be  This test is not yet approved or cleared by the [place] FDA and has been authorized for detection and/or diagnosis of SARS-CoV-2 by FDA under an Emergency Use Authorization (EUA). This EUA will remain in effect (meaning this test can be used) for the duration of the COVID-19 declaration under Section 564(b)(1) of the Act, 21 U.S.C. section 360bbb-3(b)(1), unless the authorization is terminated or revoked.  Performed at [facility], [address] [place], [place] [address]     Coagulation Studies: Recent Labs    02/06/20 0950  LABPROT 15.2  INR 1.3*    Urinalysis: No results for input(s): COLORURINE, LABSPEC, PHURINE, GLUCOSEU, HGBUR, BILIRUBINUR, KETONESUR, PROTEINUR, UROBILINOGEN, NITRITE, LEUKOCYTESUR in the last 72 hours.  Invalid  input(s): APPERANCEUR    Imaging: No results found.   Medications:    Scheduled Meds: . amLODipine  5 mg Oral Daily  . cloNIDine  0.1 mg Oral BID  . feeding supplement  1 Container Oral TID BM  . gabapentin  300 mg Oral QHS  . influenza vaccine adjuvanted  0.5 mL Intramuscular Tomorrow-1000  . isosorbide mononitrate  60 mg Oral Daily  . metoprolol succinate  200 mg Oral Daily  . pantoprazole  40 mg Oral BID  . potassium chloride  20 mEq Oral Once  . rosuvastatin  10 mg Oral Daily  . sodium chloride flush  3 mL Intravenous Q12H   Continuous Infusions: . sodium chloride     PRN Meds:.sodium chloride, acetaminophen, labetalol, ondansetron (ZOFRAN) IV, oxyCODONE, promethazine, sodium chloride flush    Assessment/ Plan:  70 y.o. male with coronary artery disease, history of STEMI with stent in October 2003, PCI 2017.  Atrial fibrillation, diabetes, gout, chronic kidney disease  Principal Problem:   Hypertensive urgency Active Problems:   AKI (acute kidney injury) ([place])   Benign essential HTN   DM type 2 (diabetes mellitus, type 2) ([facility])   CAD (coronary artery  disease)   Atrial fibrillation, chronic ([facility])   Chronic kidney disease   #.  Acute kidney injury with CKD st 4 with baseline creatinine of 2.23, GFR 29 in October 2021.  Presenting creatinine of 2.66 Recent Labs    02/04/20 0741 02/05/20 0656 02/06/20 0259 02/07/20 0239  CREATININE 3.58* 4.02* 4.00* 3.78*  AKI likely secondary to accelerated hypertension Agree with restarting home medications Blood pressure control has improved overall   #Proteinuria Urine protein to creatinine ratio 15 g in October 2021 Serum kappa lambda ratio of 1.35, SPEP negative in October 2021 Screening serologies ordered, ANA neg, complements normal Proteinuria and CKD not explained by hypertension and diabetes. Renal biopsy planned for later today   #. Anemia of CKD  Lab Results  Component Value Date   HGB 8.4 (L)  02/07/2020    #. HTN with CKD Current regimen includes amlodipine 5 mg daily Clonidine 0.1 mg twice a day Isosorbide 60 mg daily Metoprolol 200 mg daily Renal artery duplex exam neg.   #. Diabetes type 2 with CKD Hemoglobin A1C (%)  Date Value  09/27/2012 6.4 (H)   Hgb A1c MFr Bld (%)  Date Value  05/15/2019 6.0 (H)  Rosuvastatin 10 mg daily for cardiovascular risk reduction  #Atrial fibrillation Anticoagulation with Eliquis On hold for renal biopsy  #Hypokalemia Likely nutritional Will get aldosterone renin level in a.m. Replace orally as necessary  Potential discharge in the morning    LOS: [place] 1/13/202210:23 [place], [place]

## 2020-02-07 NOTE — Progress Notes (Signed)
PROGRESS NOTE    Casey French  NIO:[id number] DOB: 1950/08/01 DOA: 02/03/2020 PCP: [name], MD    Assessment & Plan:   Principal Problem:   Hypertensive urgency Active Problems:   AKI (acute kidney injury) ([place])   Benign essential HTN   DM type 2 (diabetes mellitus, type 2) ([place])   CAD (coronary artery disease)   Atrial fibrillation, chronic ([place])   Chronic kidney disease   Hypertensive emergency: resolved but still has HTN. Continue on clonidine, amlodipine, metoprolol & imdur   Chest pain: with mildly elevated high sensitive troponin which is likely secondary to demand ischemia. Resolved  CAD: s/p stent. Continue on metoprolol, eliquis & statin   PAF: continue on eliquis & metoprolol   AKI on CKD stage IV: baseline Cr 2.23. Cr is trending again today. Kidney biopsy 02/07/20.  Nephro following and recs apprec  Thrombocytopenia: resolved   Hypokalemia: KCl given. Will continue to monitor   ACD: likely secondary to CKD. H&H are labile   DM2: well controlled w/ HbA1c 6.0. Not on any po anti-DM meds at home   DVT prophylaxis: eliquis  Code Status: full  Family Communication:  Disposition Plan: likely d/c back home  Status is: Inpatient  Remains inpatient appropriate because:IV treatments appropriate due to intensity of illness or inability to take PO and Inpatient level of care appropriate due to severity of illness, kidney biopsy today    Dispo: The patient is from: Home              Anticipated d/c is to: Home              Anticipated d/c date is: 1 days              Patient currently is not medically stable to d/c.      Consultants:   nephro    Procedures:    Antimicrobials:    Subjective: Pt c/o malaise   Objective: Vitals:   02/06/20 1556 02/06/20 2044 02/07/20 0021 02/07/20 0558  BP: 123/74 (!) 149/87 122/86 129/72  Pulse: 67 75 66 65  Resp: 18 16 16 16   Temp: 98.1 F (36.7 C) 97.7 F (36.5 C) 97.9 F (36.6 C) 98.3 F  (36.8 C)  TempSrc:      SpO2: 96% 97% 96% 96%  Weight:      Height:        Intake/Output Summary (Last 24 hours) at 02/07/2020 0735 Last data filed at 02/06/2020 2319 Gross per 24 hour  Intake -  Output 200 ml  Net -200 ml   Filed Weights   02/02/20 2236  Weight: 70.3 kg    Examination:  General exam: Appears calm & comfortable  Respiratory system: clear breath sounds b/l  Cardiovascular system: S1 & S2+. No clicks or rubs  Gastrointestinal system: Abd is soft, NT, ND & hypoactive bowel sounds  Central nervous system: Alert and oriented. Moves all 4 extremities Psychiatry: Judgement and insight appear normal. Normal mood and affect    Data Reviewed: I have personally reviewed following labs and imaging studies  CBC: Recent Labs  Lab 02/02/20 2240 02/04/20 0741 02/05/20 0656 02/06/20 0259 02/07/20 0239  WBC 13.5* 10.5 10.1 9.3 8.2  HGB 9.4* 8.0* 8.2* 7.5* 8.4*  HCT 28.7* 23.7* 24.6* 22.6* 24.6*  MCV 95.3 92.9 95.3 94.6 92.5  PLT 233 162 163 142* 818   Basic Metabolic Panel: Recent Labs  Lab 02/02/20 2240 02/04/20 0741 02/05/20 0656 02/06/20 0259 02/07/20 0239  NA 141  139 138 135 136  K 3.6 3.4* 3.5 3.2* 2.9*  CL 112* 110 109 109 109  CO2 16* 19* 16* 21* 21*  GLUCOSE 128* 91 71 90 110*  BUN 25* 40* 48* 43* 40*  CREATININE 2.66* 3.58* 4.02* 4.00* 3.78*  CALCIUM 7.6* 7.0* 7.4* 6.9* 7.3*   GFR: Estimated Creatinine Clearance: 18.6 mL/min (A) (by C-G formula based on SCr of 3.78 mg/dL (H)). Liver Function Tests: Recent Labs  Lab 02/02/20 2240  AST 23  ALT 13  ALKPHOS 99  BILITOT 1.1  PROT 5.9*  ALBUMIN 2.1*   Recent Labs  Lab 02/03/20 0531  LIPASE 25   No results for input(s): AMMONIA in the last 168 hours. Coagulation Profile: Recent Labs  Lab 02/06/20 0950  INR 1.3*   Cardiac Enzymes: No results for input(s): CKTOTAL, CKMB, CKMBINDEX, TROPONINI in the last 168 hours. BNP (last 3 results) No results for input(s): PROBNP in the last  8760 hours. HbA1C: No results for input(s): HGBA1C in the last 72 hours. CBG: No results for input(s): GLUCAP in the last 168 hours. Lipid Profile: No results for input(s): CHOL, HDL, LDLCALC, TRIG, CHOLHDL, LDLDIRECT in the last 72 hours. Thyroid Function Tests: No results for input(s): TSH, T4TOTAL, FREET4, T3FREE, THYROIDAB in the last 72 hours. Anemia Panel: No results for input(s): VITAMINB12, FOLATE, FERRITIN, TIBC, IRON, RETICCTPCT in the last 72 hours. Sepsis Labs: No results for input(s): PROCALCITON, LATICACIDVEN in the last 168 hours.  Recent Results (from the past 240 hour(s))  Resp Panel by RT-PCR (Flu A&B, Covid) Nasopharyngeal Swab     Status: None   Collection Time: 02/03/20  7:36 AM   Specimen: Nasopharyngeal Swab; Nasopharyngeal(NP) swabs in vial transport medium  Result Value Ref Range Status   SARS Coronavirus 2 by RT PCR NEGATIVE NEGATIVE Final    Comment: (NOTE) SARS-CoV-2 target nucleic acids are NOT DETECTED.  The SARS-CoV-2 RNA is generally detectable in upper respiratory specimens during the acute phase of infection. The lowest concentration of SARS-CoV-2 viral copies this assay can detect is 138 copies/mL. A negative result does not preclude SARS-Cov-2 infection and should not be used as the sole basis for treatment or other patient management decisions. A negative result may occur with  improper specimen collection/handling, submission of specimen other than nasopharyngeal swab, presence of viral mutation(s) within the areas targeted by this assay, and inadequate number of viral copies(<138 copies/mL). A negative result must be combined with clinical observations, patient history, and epidemiological information. The expected result is Negative.  Fact Sheet for Patients:  EntrepreneurPulse.com.au  Fact Sheet for Healthcare Providers:  IncredibleEmployment.be  This test is no t yet approved or cleared by the  [place] FDA and  has been authorized for detection and/or diagnosis of SARS-CoV-2 by FDA under an Emergency Use Authorization (EUA). This EUA will remain  in effect (meaning this test can be used) for the duration of the COVID-19 declaration under Section 564(b)(1) of the Act, 21 U.S.C.section 360bbb-3(b)(1), unless the authorization is terminated  or revoked sooner.       Influenza A by PCR NEGATIVE NEGATIVE Final   Influenza B by PCR NEGATIVE NEGATIVE Final    Comment: (NOTE) The Xpert Xpress SARS-CoV-2/FLU/RSV plus assay is intended as an aid in the diagnosis of influenza from Nasopharyngeal swab specimens and should not be used as a sole basis for treatment. Nasal washings and aspirates are unacceptable for Xpert Xpress SARS-CoV-2/FLU/RSV testing.  Fact Sheet for Patients: EntrepreneurPulse.com.au  Fact Sheet for Healthcare  Providers: IncredibleEmployment.be  This test is not yet approved or cleared by the [place] and has been authorized for detection and/or diagnosis of SARS-CoV-2 by FDA under an Emergency Use Authorization (EUA). This EUA will remain in effect (meaning this test can be used) for the duration of the COVID-19 declaration under Section 564(b)(1) of the Act, 21 U.S.C. section 360bbb-3(b)(1), unless the authorization is terminated or revoked.  Performed at [facility], [address], [place], [place] [address]          Radiology Studies: No results found.      Scheduled Meds: . amLODipine  5 mg Oral Daily  . cloNIDine  0.1 mg Oral BID  . feeding supplement  1 Container Oral TID BM  . gabapentin  300 mg Oral QHS  . influenza vaccine adjuvanted  0.5 mL Intramuscular Tomorrow-1000  . isosorbide mononitrate  60 mg Oral Daily  . metoprolol succinate  200 mg Oral Daily  . pantoprazole  40 mg Oral BID  . potassium chloride  20 mEq Oral Once  . rosuvastatin  10 mg Oral Daily  . sodium  chloride flush  3 mL Intravenous Q12H   Continuous Infusions: . sodium chloride       LOS: 4 days    Time spent: 30 mins     Wyvonnia Dusky, MD [facility] Pager [phone]  If 7PM-7AM, please contact night-coverage 02/07/2020, 7:35 AM

## 2020-02-08 LAB — BASIC METABOLIC PANEL
Anion gap: 7 (ref 5–15)
BUN: 38 mg/dL — ABNORMAL HIGH (ref 8–23)
CO2: 23 mmol/L (ref 22–32)
Calcium: 6.9 mg/dL — ABNORMAL LOW (ref 8.9–10.3)
Chloride: 111 mmol/L (ref 98–111)
Creatinine, Ser: 3.61 mg/dL — ABNORMAL HIGH (ref 0.61–1.24)
GFR, Estimated: 18 mL/min — ABNORMAL LOW (ref >60)
Glucose, Bld: 126 mg/dL — ABNORMAL HIGH (ref 70–99)
Potassium: 3.6 mmol/L (ref 3.5–5.1)
Sodium: 141 mmol/L (ref 135–145)

## 2020-02-08 LAB — CBC
HCT: 24.9 % — ABNORMAL LOW (ref 39.0–52.0)
Hemoglobin: 8.3 g/dL — ABNORMAL LOW (ref 13.0–17.0)
MCH: 31.2 pg (ref 26.0–34.0)
MCHC: 33.3 g/dL (ref 30.0–36.0)
MCV: 93.6 fL (ref 80.0–100.0)
Platelets: 178 10*3/uL (ref 150–400)
RBC: 2.66 MIL/uL — ABNORMAL LOW (ref 4.22–5.81)
RDW: 13.8 % (ref 11.5–15.5)
WBC: 7.1 10*3/uL (ref 4.0–10.5)
nRBC: 0 % (ref 0.0–0.2)

## 2020-02-08 LAB — ALDOSTERONE + RENIN ACTIVITY W/ RATIO
ALDO / PRA Ratio: <3.8 (ref 0.0–30.0)
Aldosterone: <1 ng/dL (ref 0.0–30.0)
PRA LC/MS/MS: 0.262 ng/mL/hr (ref 0.167–5.380)

## 2020-02-08 MED ORDER — AMLODIPINE BESYLATE 5 MG PO TABS: 5.0000 mg | ORAL_TABLET | Freq: Every day | ORAL | 0 refills | Status: DC

## 2020-02-08 MED ORDER — CLONIDINE HCL 0.1 MG PO TABS: 0.1000 mg | ORAL_TABLET | Freq: Two times a day (BID) | ORAL | 0 refills | Status: DC

## 2020-02-08 MED ORDER — TORSEMIDE 10 MG PO TABS: 10.0000 mg | ORAL_TABLET | Freq: Every day | ORAL | Status: DC

## 2020-02-08 MED ORDER — METOPROLOL SUCCINATE ER 200 MG PO TB24: 200.0000 mg | ORAL_TABLET | Freq: Every day | ORAL | 0 refills | Status: DC

## 2020-02-08 NOTE — Discharge Summary (Signed)
Physician Discharge Summary  Casey French [id number] DOB: October 19, 1950 DOA: 02/03/2020  PCP: [name], MD  Admit date: 02/03/2020 Discharge date: 02/08/2020  Admitted From: home  Disposition:  Home   Recommendations for Outpatient Follow-up:  1. Follow up with PCP in 1-2 weeks 2. F/u w/ nephro, Dr. Juleen China, on 02/13/20 at 9am 3. F/u cardio in 1-2 weeks   Home Health: no  Equipment/Devices:  Discharge Condition: stable CODE STATUS: full  Diet recommendation: Heart Healthy / Carb Modified   Brief/Interim Summary: HPI was taken from Dr. Si Raider: Casey French is a 70 y.o. male with medical history significant for htn, ckd 3/4, cad s/p stent 2003 and 2017, af on doac, anemia, who presents with the above.  Was in usual state of health when yesterday afternoon began to experience moderate substernal chest pain, unaffected by movement. Some mild associated shortness of breath. Around this time developed nausea with a few episodes nbnb small volume emesis as well as loose stools. Has chronic hrmorrhoids and chronic blood when stools, this has persisted. No mucous. No fevers. No abdominal pain. No back pain. Chest pain persists, is described as mild. Recent med change per nephro, torsemide substituted for hctz. Says Is compliant w/ home meds. No covid contacts.   ED Course:   Labetalol, zofran, 1 L, nephrology consult  Hospital Course from Dr. Jimmye Norman 02/05/20: Pt evidently presented w/ HTN emergency which has since resolved. Pt did receive clonidine, amlodipine, metoprolol & imdur while inpatient for HTN. Furthermore, pt was found to have AKI on CKDIV of unknown etiology. Pt had a kidney biopsy on 02/07/20. Pt will f/u outpatient w/ Dr. Raiford Noble on 02/13/20 to get the results. Pt did not need any therapy as pt was able to ambulate and transfer independently. Please see previous progress/consult notes for more information.   Discharge Diagnoses:  Principal Problem:    Hypertensive urgency Active Problems:   AKI (acute kidney injury) ([place])   Benign essential HTN   DM type 2 (diabetes mellitus, type 2) (H[facility])   CAD (coronary artery disease)   Atrial fibrillation, chronic ([facility])   Chronic kidney disease  Hypertensive emergency: resolved but still has HTN. Continue on clonidine, amlodipine, metoprolol & imdur   Chest pain: with mildly elevated high sensitive troponin which is likely secondary to demand ischemia. Resolved  CAD:s/p stent. Continue on metoprolol, eliquis & statin   PAF: continue on eliquis & metoprolol   AKIon CKD stage IV: baseline Cr 2.23. Cr is trending again today. Kidney biopsy 02/07/20.  Nephro following and recs apprec  Thrombocytopenia: resolved   Hypokalemia: KCl given. Will continue to monitor   ACD: likely secondary to CKD. H&H are labile   DM2: well controlled w/ HbA1c 6.0. Not on any po anti-DM meds at home  Discharge Instructions  Discharge Instructions    Diet - low sodium heart healthy   Complete by: As directed    Discharge instructions   Complete by: As directed    F/u w/ nephro, Dr. Juleen China on Wednesday Jan 19, 9.40 am. F/u w/ PCP in 2 weeks. F/u w/ cardio in 2 weeks   Increase activity slowly   Complete by: As directed    No wound care   Complete by: As directed      Allergies as of 02/08/2020      Reactions   Morphine Anxiety   Other reaction(s): Other (See Comments) agitation   Morphine And Related Anxiety      Medication List  STOP taking these medications   lisinopril 20 MG tablet Commonly known as: ZESTRIL     TAKE these medications   allopurinol 100 MG tablet Commonly known as: ZYLOPRIM Take 100 mg by mouth daily.   amLODipine 5 MG tablet Commonly known as: NORVASC Take 1 tablet (5 mg total) by mouth daily. Start taking on: February 09, 2020   apixaban 5 MG Tabs tablet Commonly known as: ELIQUIS Take 5 mg by mouth 2 (two) times daily.   aspirin 81 MG EC tablet Take 81  mg by mouth daily.   cloNIDine 0.1 MG tablet Commonly known as: CATAPRES Take 1 tablet (0.1 mg total) by mouth 2 (two) times daily.   fluticasone 50 MCG/ACT nasal spray Commonly known as: FLONASE Place 2 sprays into both nostrils daily.   gabapentin 300 MG capsule Commonly known as: NEURONTIN Take 300 mg by mouth at bedtime.   isosorbide mononitrate 60 MG 24 hr tablet Commonly known as: IMDUR Take 60 mg by mouth daily.   metoprolol 200 MG 24 hr tablet Commonly known as: TOPROL-XL Take 1 tablet (200 mg total) by mouth daily. Take with or immediately following a meal. Start taking on: February 09, 2020 What changed:   medication strength  how much to take  additional instructions   nitroGLYCERIN 0.4 MG SL tablet Commonly known as: NITROSTAT Place 0.4 mg under the tongue every 5 (five) minutes as needed for chest pain.   omeprazole 20 MG capsule Commonly known as: PRILOSEC Take 20 mg by mouth daily.   psyllium 58.6 % packet Commonly known as: METAMUCIL Take 1 packet by mouth 2 (two) times daily.   rosuvastatin 40 MG tablet Commonly known as: CRESTOR Take 40 mg by mouth daily.   torsemide 10 MG tablet Commonly known as: DEMADEX Take 1 tablet (10 mg total) by mouth daily. Hold this medication until you see your nephrologist What changed: additional instructions       Allergies  Allergen Reactions  . Morphine Anxiety    Other reaction(s): Other (See Comments) agitation   . Morphine And Related Anxiety    Consultations:  nephro   cardio   Procedures/Studies: CT Abdomen Pelvis Wo Contrast  Result Date: 02/03/2020 CLINICAL DATA:  Left lower quadrant pain beginning yesterday. Nausea and vomiting. EXAM: CT ABDOMEN AND PELVIS WITHOUT CONTRAST TECHNIQUE: Multidetector CT imaging of the abdomen and pelvis was performed following the standard protocol without IV contrast. COMPARISON:  05/15/2019 FINDINGS: Lower chest: No acute findings. Hepatobiliary: No mass  visualized on this unenhanced exam. Gallbladder is unremarkable. No evidence of biliary ductal dilatation. Pancreas: No mass or inflammatory process visualized on this unenhanced exam. Spleen:  Within normal limits in size. Adrenals/Urinary tract: No evidence of urolithiasis or hydronephrosis. Stable small subcapsular cyst is seen in the lateral lower pole of the right kidney. Unremarkable unopacified urinary bladder. Stomach/Bowel: No evidence of obstruction, inflammatory process, or abnormal fluid collections. Normal appendix visualized. Severe sigmoid diverticulosis and associated muscular hypertrophy is again demonstrated, however there is no evidence of acute diverticulitis. Vascular/Lymphatic: No pathologically enlarged lymph nodes identified. No evidence of abdominal aortic aneurysm. Aortic atherosclerotic calcification noted. Reproductive:  No mass or other significant abnormality. Other:  None. Musculoskeletal: No suspicious bone lesions identified. Chronic avascular necrosis is again seen involving the left femoral head. IMPRESSION: No evidence of urolithiasis, hydronephrosis, or other acute findings. Severe sigmoid diverticulosis, without radiographic evidence of acute diverticulitis. 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 Aortic Atherosclerosis (ICD10-I70.0). Electronically Signed   By: Jenny Reichmann  Tereso Newcomer M.D.   On: 02/03/2020 08:29   DG Chest 2 View  Result Date: 02/02/2020 CLINICAL DATA:  70 year old male with shortness of breath. EXAM: CHEST - 2 VIEW COMPARISON:  Chest radiograph dated 06/26/2018. FINDINGS: Left lung base linear atelectasis/scarring. No focal consolidation, pleural effusion or pneumothorax. The cardiac silhouette is within limits. No acute osseous pathology. IMPRESSION: No active cardiopulmonary disease. Electronically Signed   By: Anner Crete M.D.   On: 02/02/2020 22:57   CT HEAD WO CONTRAST  Result Date: 02/03/2020 CLINICAL DATA:  Head EXAM: CT HEAD WITHOUT CONTRAST  TECHNIQUE: Contiguous axial images were obtained from the base of the skull through the vertex without intravenous contrast. COMPARISON:  April 16, 2013 FINDINGS: Brain: There is hypodensity within the LEFT occipital lobe (series 3, image 13). This is increased in conspicuity in comparison to prior. No evidence of hemorrhage. Periventricular white matter hypodensities consistent with sequela of chronic microvascular ischemic disease. Global parenchymal volume loss. Remote RIGHT insular lacunar infarction versus dilated perivascular space. Vascular: Vascular calcifications of the carotid siphons. Skull: Normal. Negative for fracture or focal lesion. Sinuses/Orbits: No acute finding. Other: None. IMPRESSION: 1. Hypodensity within the LEFT occipital lobe is increased in conspicuity in comparison to prior study. This is nonspecific and could reflect age-indeterminate infarction versus artifact. Consider further evaluation with dedicated MRI if concern for infarction. 2. No evidence of hemorrhage. Electronically Signed   By: Valentino Saxon MD   On: 02/03/2020 14:10   MR BRAIN WO CONTRAST  Result Date: 02/03/2020 CLINICAL DATA:  Initial evaluation for acute stroke. EXAM: MRI HEAD WITHOUT CONTRAST TECHNIQUE: Multiplanar, multiecho pulse sequences of the brain and surrounding structures were obtained without intravenous contrast. COMPARISON:  Prior head CT from earlier the same day. FINDINGS: Brain: Cerebral volume within normal limits for age. Probable underlying patchy T2/FLAIR hyperintensity about the periventricular and deep white matter, most like related chronic microvascular ischemic disease. Suspected involvement of the pons as well. Superimposed remote lacunar infarcts present at the right lentiform nucleus, right thalamus, and left middle cerebellar peduncle. There is fairly extensive patchy T2/FLAIR signal abnormality involving the cortical and subcortical aspects of both frontal, parietal, and occipital  lobes. Patchy and fairly symmetric involvement of the medial thalami. Additional patchy multifocal involvement of the cerebellar hemispheres bilaterally. Findings are nonspecific, but favored to reflect changes of PRES. No significant mass effect, associated hemorrhage or other complication. No foci of restricted diffusion to suggest acute or subacute ischemia. Gray-white matter differentiation maintained. No other areas of remote cortical infarction. No evidence for acute or chronic intracranial hemorrhage. No mass lesion, midline shift or mass effect. No hydrocephalus or extra-axial fluid collection. Pituitary gland suprasellar region normal. Midline structures intact. Vascular: Major intracranial vascular flow voids are maintained. Skull and upper cervical spine: Craniocervical junction within normal limits. Bone marrow signal intensity normal. No scalp soft tissue abnormality. Sinuses/Orbits: Patient status post ocular lens replacement on the right. Globes and orbital soft tissues demonstrate no acute finding. Paranasal sinuses are largely clear. No mastoid effusion. Inner ear structures grossly normal. Other: None. IMPRESSION: 1. Extensive patchy T2/FLAIR signal abnormality involving the cortical and subcortical aspects of both frontal, parietal, and occipital lobes as well as the medial thalami and cerebellum. Findings are nonspecific, but favored to reflect changes of PRES. 2. Underlying age-related cerebral atrophy with chronic microvascular ischemic disease, with multiple remote lacunar infarcts involving the right basal ganglia, right thalamus, and left middle cerebellar peduncle. Electronically Signed   By: Pincus Badder.D.  On: 02/03/2020 18:34   US RENAL ARTERY DUPLEX COMPLETE  Result Date: 02/04/2020 CLINICAL DATA:  Hypertensive urgency. Evaluate for renal artery stenosis. EXAM: RENAL/URINARY TRACT ULTRASOUND RENAL DUPLEX DOPPLER ULTRASOUND COMPARISON:  CT abdomen and pelvis-02/03/2020  FINDINGS: Right Kidney: Normal cortical thickness and size, measuring 11.2 cm in length, however there is diffuse increased cortical echogenicity. Note is made of an approximately 1.7 x 1.7 x 1.5 cm exophytic anechoic cyst arising from the inferior pole of the right kidney (images 31 and 32). No echogenic renal stones. No urinary obstruction. Left Kidney: Normal cortical thickness and size, measuring 11.4 cm in length, however there is diffuse increased cortical echogenicity. Note is made of an approximately 0.9 x 0.9 x 0.8 cm anechoic lesion arising from the inferior pole the left kidney, too small to adequately characterize though favored to represent a renal cyst. No echogenic renal stones. No urinary obstruction. Bladder:  Appears normal given degree of distention. _________________________________________________________ RENAL DUPLEX ULTRASOUND Right Renal Artery Velocities: Origin:  66 cm/sec Mid:  53 cm/sec Hilum:  43 cm/sec Interlobar:  21 cm/sec Arcuate:  23 cm/sec Left Renal Artery Velocities: Origin:  144 cm/sec Mid:  83 cm/sec Hilum:  73 cm/sec Interlobar:  25 cm/sec Arcuate:  22 cm/sec Aortic Velocity:  176 cm/sec Right Renal-Aortic Ratios: Origin: 0.4 Mid:  0.3 Hilum: 0.2 Interlobar: 0.1 Arcuate: 0.1 Left Renal-Aortic Ratios: Origin: 1 0.8 Mid: 0.5 Hilum: 0.2 Interlobar: 0.2 Arcuate: 0.1 Normal velocities and low resistance waveforms are demonstrated throughout the interrogated portions of the bilateral renal arteries and renal parenchyma. Bilateral renal veins appear patent where imaged. Scattered eccentric mixed echogenic plaque is seen throughout the normal caliber abdominal aorta. IMPRESSION: 1. No evidence of renal artery stenosis. 2. Bilateral increased cortical echogenicity without evidence of atrophy, nonspecific though could be seen in the setting medical renal disease. 3. No evidence of urinary obstruction. 4.  Aortic Atherosclerosis (ICD10-I70.0). Electronically Signed   By: Sandi Mariscal M.D.    On: 02/04/2020 12:12   ECHOCARDIOGRAM COMPLETE  Result Date: 02/05/2020    ECHOCARDIOGRAM REPORT   Patient Name:   Casey French Date of Exam: 02/04/2020 Medical Rec #:  [id number]           Height:       69.0 in Accession #:    [id number]          Weight:       155.0 lb Date of Birth:  [birth year]           BSA:          1.854 m Patient Age:    80 years            BP:           143/81 mmHg Patient Gender: M                   HR:           74 bpm. Exam Location:  [facility] Procedure: 2D Echo, Color Doppler and Cardiac Doppler Indications:     Chest pain R07.9  History:         Patient has no prior history of Echocardiogram examinations.                  Previous Myocardial Infarction; Risk Factors:Diabetes and                  Hypertension.  Sonographer:     Sherrie Sport RDCS (AE) Referring Phys:  [phone]  ALEXANDER PARASCHOS Diagnosing Phys: Yolonda Kida MD IMPRESSIONS  1. Left ventricular ejection fraction, by estimation, is 60 to 65%. The left ventricle has normal function. The left ventricle has no regional wall motion abnormalities. Left ventricular diastolic parameters were normal.  2. Right ventricular systolic function is normal. The right ventricular size is normal.  3. The mitral valve is grossly normal. Trivial mitral valve regurgitation.  4. The aortic valve is normal in structure. Aortic valve regurgitation is not visualized. FINDINGS  Left Ventricle: Left ventricular ejection fraction, by estimation, is 60 to 65%. The left ventricle has normal function. The left ventricle has no regional wall motion abnormalities. The left ventricular internal cavity size was normal in size. There is  borderline left ventricular hypertrophy. Left ventricular diastolic parameters were normal. Right Ventricle: The right ventricular size is normal. No increase in right ventricular wall thickness. Right ventricular systolic function is normal. Left Atrium: Left atrial size was normal in size. Right Atrium: Right  atrial size was normal in size. Pericardium: Trivial pericardial effusion is present. Mitral Valve: The mitral valve is grossly normal. There is mild thickening of the mitral valve leaflet(s). There is mild calcification of the mitral valve leaflet(s). Mild mitral annular calcification. Trivial mitral valve regurgitation. Tricuspid Valve: The tricuspid valve is grossly normal. Tricuspid valve regurgitation is trivial. Aortic Valve: The aortic valve is normal in structure. Aortic valve regurgitation is not visualized. Aortic valve mean gradient measures 7.0 mmHg. Aortic valve peak gradient measures 10.1 mmHg. Aortic valve area, by VTI measures 2.97 cm. Pulmonic Valve: The pulmonic valve was normal in structure. Pulmonic valve regurgitation is not visualized. Aorta: The ascending aorta was not well visualized. IAS/Shunts: No atrial level shunt detected by color flow Doppler.  LEFT VENTRICLE PLAX 2D LVIDd:         3.68 cm  Diastology LVIDs:         2.57 cm  LV e' medial:    7.51 cm/s LV PW:         1.37 cm  LV E/e' medial:  12.5 LV IVS:        1.10 cm  LV e' lateral:   6.96 cm/s LVOT diam:     2.10 cm  LV E/e' lateral: 13.5 LV SV:         103 LV SV Index:   56 LVOT Area:     3.46 cm  RIGHT VENTRICLE RV Basal diam:  2.69 cm RV S prime:     16.00 cm/s TAPSE (M-mode): 4.1 cm LEFT ATRIUM             Index       RIGHT ATRIUM           Index LA diam:        3.60 cm 1.94 cm/m  RA Area:     18.80 cm LA Vol (A2C):   67.5 ml 36.41 ml/m RA Volume:   45.90 ml  24.76 ml/m LA Vol (A4C):   57.4 ml 30.96 ml/m LA Biplane Vol: 65.5 ml 35.33 ml/m  AORTIC VALVE                    PULMONIC VALVE AV Area (Vmax):    2.88 cm     PV Vmax:        0.88 m/s AV Area (Vmean):   2.60 cm     PV Peak grad:   3.1 mmHg AV Area (VTI):     2.97 cm  RVOT Peak grad: 3 mmHg AV Vmax:           159.00 cm/s AV Vmean:          127.000 cm/s AV VTI:            0.347 m AV Peak Grad:      10.1 mmHg AV Mean Grad:      7.0 mmHg LVOT Vmax:         132.00  cm/s LVOT Vmean:        95.500 cm/s LVOT VTI:          0.298 m LVOT/AV VTI ratio: 0.86  AORTA Ao Root diam: 3.80 cm MITRAL VALVE               TRICUSPID VALVE MV Area (PHT): 3.28 cm    TR Peak grad:   22.7 mmHg MV Decel Time: 231 msec    TR Vmax:        238.00 cm/s MV E velocity: 93.80 cm/s MV A velocity: 76.30 cm/s  SHUNTS MV E/A ratio:  1.23        Systemic VTI:  0.30 m                            Systemic Diam: 2.10 cm Dwayne Prince Rome MD Electronically signed by Yolonda Kida MD Signature Date/Time: 02/05/2020/3:12:37 PM    Final    US BIOPSY (KIDNEY)  Result Date: 02/07/2020 INDICATION: 34 year old with acute kidney injury and proteinuria. Request for a random renal biopsy. EXAM: ULTRASOUND-GUIDED LEFT RENAL BIOPSY MEDICATIONS: None. ANESTHESIA/SEDATION: Moderate (conscious) sedation was employed during this procedure. A total of Versed 2.0 mg and Fentanyl 100 mcg was administered intravenously. Moderate Sedation Time: 12 minutes. The patient's level of consciousness and vital signs were monitored continuously by radiology nursing throughout the procedure under my direct supervision. FLUOROSCOPY TIME:  None COMPLICATIONS: None immediate. PROCEDURE: Informed written consent was obtained from the patient after a thorough discussion of the procedural risks, benefits and alternatives. All questions were addressed. A timeout was performed prior to the initiation of the procedure. Patient was placed prone. Both kidneys were evaluated with ultrasound. The left kidney was selected for biopsy. The left flank was prepped with chlorhexidine and sterile field was created. Skin and soft tissues were anesthetized using 1% lidocaine. A small incision was made. Using ultrasound guidance, 16 gauge core needle was directed into the left kidney lower pole cortex and a core biopsy was obtained. Core biopsy was placed on a Telfa pad with saline. A second 16 gauge core biopsy was obtained for the left kidney lower pole using  ultrasound guidance. Second core biopsy was also placed on Telfa pad with saline. Specimens were felt to be adequate and no additional samples were obtained. Left kidney was evaluated with ultrasound following the biopsy. Bandage placed over the puncture site. FINDINGS: Negative for hydronephrosis. Exophytic cyst along the right kidney lower pole. Two core biopsies were obtained from the left kidney lower pole. Two adequate specimens obtained. No immediate bleeding or hematoma formation. IMPRESSION: Successful ultrasound-guided core biopsies from the left kidney lower pole. Electronically Signed   By: Markus Daft M.D.   On: 02/07/2020 13:02      Subjective: Pt denies any complaints    Discharge Exam: Vitals:   02/08/20 0800 02/08/20 1207  BP: (!) 154/78 125/77  Pulse: 66 63  Resp: 18 18  Temp: 97.9 F (36.6 C) 97.8 F (36.6  C)  SpO2: 98% 95%   Vitals:   02/08/20 0112 02/08/20 0604 02/08/20 0800 02/08/20 1207  BP: (!) 144/75 (!) 156/89 (!) 154/78 125/77  Pulse: 71 68 66 63  Resp: 17 18 18 18   Temp: 98.1 F (36.7 C) 97.7 F (36.5 C) 97.9 F (36.6 C) 97.8 F (36.6 C)  TempSrc:    Oral  SpO2: 97% 98% 98% 95%  Weight:      Height:        General: Pt is alert, awake, not in acute distress Cardiovascular:  S1/S2 +, no rubs, no gallops Respiratory: CTA bilaterally, no wheezing, no rhonchi Abdominal: Soft, NT, ND, bowel sounds + Extremities: no cyanosis    The results of significant diagnostics from this hospitalization (including imaging, microbiology, ancillary and laboratory) are listed below for reference.     Microbiology: Recent Results (from the past 240 hour(s))  Resp Panel by RT-PCR (Flu A&B, Covid) Nasopharyngeal Swab     Status: None   Collection Time: 02/03/20  7:36 AM   Specimen: Nasopharyngeal Swab; Nasopharyngeal(NP) swabs in vial transport medium  Result Value Ref Range Status   SARS Coronavirus 2 by RT PCR NEGATIVE NEGATIVE Final    Comment:  (NOTE) SARS-CoV-2 target nucleic acids are NOT DETECTED.  The SARS-CoV-2 RNA is generally detectable in upper respiratory specimens during the acute phase of infection. The lowest concentration of SARS-CoV-2 viral copies this assay can detect is 138 copies/mL. A negative result does not preclude SARS-Cov-2 infection and should not be used as the sole basis for treatment or other patient management decisions. A negative result may occur with  improper specimen collection/handling, submission of specimen other than nasopharyngeal swab, presence of viral mutation(s) within the areas targeted by this assay, and inadequate number of viral copies(<138 copies/mL). A negative result must be combined with clinical observations, patient history, and epidemiological information. The expected result is Negative.  Fact Sheet for Patients:  EntrepreneurPulse.com.au  Fact Sheet for Healthcare Providers:  IncredibleEmployment.be  This test is no t yet approved or cleared by the [place] FDA and  has been authorized for detection and/or diagnosis of SARS-CoV-2 by FDA under an Emergency Use Authorization (EUA). This EUA will remain  in effect (meaning this test can be used) for the duration of the COVID-19 declaration under Section 564(b)(1) of the Act, 21 U.S.C.section 360bbb-3(b)(1), unless the authorization is terminated  or revoked sooner.       Influenza A by PCR NEGATIVE NEGATIVE Final   Influenza B by PCR NEGATIVE NEGATIVE Final    Comment: (NOTE) The Xpert Xpress SARS-CoV-2/FLU/RSV plus assay is intended as an aid in the diagnosis of influenza from Nasopharyngeal swab specimens and should not be used as a sole basis for treatment. Nasal washings and aspirates are unacceptable for Xpert Xpress SARS-CoV-2/FLU/RSV testing.  Fact Sheet for Patients: EntrepreneurPulse.com.au  Fact Sheet for Healthcare  Providers: IncredibleEmployment.be  This test is not yet approved or cleared by the [place] FDA and has been authorized for detection and/or diagnosis of SARS-CoV-2 by FDA under an Emergency Use Authorization (EUA). This EUA will remain in effect (meaning this test can be used) for the duration of the COVID-19 declaration under Section 564(b)(1) of the Act, 21 U.S.C. section 360bbb-3(b)(1), unless the authorization is terminated or revoked.  Performed at [facility], [address] [place], [place] [address]      Labs: BNP (last 3 results) No results for input(s): BNP in the last 8760 hours. Basic Metabolic Panel:  Recent Labs  Lab 02/04/20 0741 02/05/20 0656 02/06/20 0259 02/07/20 0239 02/08/20 0211  NA 139 138 135 136 141  K 3.4* 3.5 3.2* 2.9* 3.6  CL 110 109 109 109 111  CO2 19* 16* 21* 21* 23  GLUCOSE 91 71 90 110* 126*  BUN 40* 48* 43* 40* 38*  CREATININE 3.58* 4.02* 4.00* 3.78* 3.61*  CALCIUM 7.0* 7.4* 6.9* 7.3* 6.9*   Liver Function Tests: Recent Labs  Lab 02/02/20 2240  AST 23  ALT 13  ALKPHOS 99  BILITOT 1.1  PROT 5.9*  ALBUMIN 2.1*   Recent Labs  Lab 02/03/20 0531  LIPASE 25   No results for input(s): AMMONIA in the last 168 hours. CBC: Recent Labs  Lab 02/04/20 0741 02/05/20 0656 02/06/20 0259 02/07/20 0239 02/08/20 0211  WBC 10.5 10.1 9.3 8.2 7.1  HGB 8.0* 8.2* 7.5* 8.4* 8.3*  HCT 23.7* 24.6* 22.6* 24.6* 24.9*  MCV 92.9 95.3 94.6 92.5 93.6  PLT 162 163 142* 159 178   Cardiac Enzymes: No results for input(s): CKTOTAL, CKMB, CKMBINDEX, TROPONINI in the last 168 hours. BNP: Invalid input(s): POCBNP CBG: No results for input(s): GLUCAP in the last 168 hours. D-Dimer No results for input(s): DDIMER in the last 72 hours. Hgb A1c No results for input(s): HGBA1C in the last 72 hours. Lipid Profile No results for input(s): CHOL, HDL, LDLCALC, TRIG, CHOLHDL, LDLDIRECT in the last 72 hours. Thyroid  function studies No results for input(s): TSH, T4TOTAL, T3FREE, THYROIDAB in the last 72 hours.  Invalid input(s): FREET3 Anemia work up No results for input(s): VITAMINB12, FOLATE, FERRITIN, TIBC, IRON, RETICCTPCT in the last 72 hours. Urinalysis    Component Value Date/Time   COLORURINE YELLOW (A) 12/10/2018 1632   APPEARANCEUR CLEAR (A) 12/10/2018 1632   LABSPEC 1.024 12/10/2018 1632   PHURINE 6.0 12/10/2018 1632   GLUCOSEU 150 (A) 12/10/2018 1632   HGBUR NEGATIVE 12/10/2018 1632   BILIRUBINUR NEGATIVE 12/10/2018 1632   KETONESUR NEGATIVE 12/10/2018 1632   PROTEINUR >=300 (A) 12/10/2018 1632   NITRITE NEGATIVE 12/10/2018 1632   LEUKOCYTESUR NEGATIVE 12/10/2018 1632   Sepsis Labs Invalid input(s): PROCALCITONIN,  WBC,  LACTICIDVEN Microbiology Recent Results (from the past 240 hour(s))  Resp Panel by RT-PCR (Flu A&B, Covid) Nasopharyngeal Swab     Status: None   Collection Time: 02/03/20  7:36 AM   Specimen: Nasopharyngeal Swab; Nasopharyngeal(NP) swabs in vial transport medium  Result Value Ref Range Status   SARS Coronavirus 2 by RT PCR NEGATIVE NEGATIVE Final    Comment: (NOTE) SARS-CoV-2 target nucleic acids are NOT DETECTED.  The SARS-CoV-2 RNA is generally detectable in upper respiratory specimens during the acute phase of infection. The lowest concentration of SARS-CoV-2 viral copies this assay can detect is 138 copies/mL. A negative result does not preclude SARS-Cov-2 infection and should not be used as the sole basis for treatment or other patient management decisions. A negative result may occur with  improper specimen collection/handling, submission of specimen other than nasopharyngeal swab, presence of viral mutation(s) within the areas targeted by this assay, and inadequate number of viral copies(<138 copies/mL). A negative result must be combined with clinical observations, patient history, and epidemiological information. The expected result is  Negative.  Fact Sheet for Patients:  EntrepreneurPulse.com.au  Fact Sheet for Healthcare Providers:  IncredibleEmployment.be  This test is no t yet approved or cleared by the [place] FDA and  has been authorized for detection and/or diagnosis of SARS-CoV-2 by FDA under an Emergency Use  Authorization (EUA). This EUA will remain  in effect (meaning this test can be used) for the duration of the COVID-19 declaration under Section 564(b)(1) of the Act, 21 U.S.C.section 360bbb-3(b)(1), unless the authorization is terminated  or revoked sooner.       Influenza A by PCR NEGATIVE NEGATIVE Final   Influenza B by PCR NEGATIVE NEGATIVE Final    Comment: (NOTE) The Xpert Xpress SARS-CoV-2/FLU/RSV plus assay is intended as an aid in the diagnosis of influenza from Nasopharyngeal swab specimens and should not be used as a sole basis for treatment. Nasal washings and aspirates are unacceptable for Xpert Xpress SARS-CoV-2/FLU/RSV testing.  Fact Sheet for Patients: EntrepreneurPulse.com.au  Fact Sheet for Healthcare Providers: IncredibleEmployment.be  This test is not yet approved or cleared by the [place] FDA and has been authorized for detection and/or diagnosis of SARS-CoV-2 by FDA under an Emergency Use Authorization (EUA). This EUA will remain in effect (meaning this test can be used) for the duration of the COVID-19 declaration under Section 564(b)(1) of the Act, 21 U.S.C. section 360bbb-3(b)(1), unless the authorization is terminated or revoked.  Performed at [facility], [address] [place], [place] [address]      Time coordinating discharge: Over 30 minutes  SIGNED:   Wyvonnia Dusky, MD  [facility] 02/08/2020, 12:40 PM Pager   If 7PM-7AM, please contact night-coverage

## 2020-02-08 NOTE — Progress Notes (Signed)
[facility], [place] 02/08/20  Subjective:   LOS: 5 01/13 0701 - 01/14 0700 In: 240 [P.O.:240] Out: 4696 [EXBMW:4132]  Patient known to our practice from outpatient follow-up.  He was last seen by Dr. Juleen China in October 2021.  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  He has underlying problems of hypertension, diabetes and nonsteroidal use.  He was started on torsemide in October but not sure if he is taking it.  He presents to the emergency room today for chest pain and elevated blood pressure.  He also reported vomiting and feeling pale.  Initial blood pressure of 221/104.  Blood sugar of 146.  Patient was given IV labetalol with some improvement in blood pressure of 180/96.  Patient is also noted to have increased creatinine of 2.66.  His baseline appears to be 2.23 from November 15, 2019.  Outpatient urine protein to creatinine ratio of 15 g.  Hemoglobin A1c of 6.2%.  1/13-no acute events.  Remains on room air.  Able to eat without nausea or vomiting.  Biopsy scheduled for later today hypokalemia noted.  Patient is getting oral potassium supplementation  1/14: No acute events.  Room air.  Able to eat without nausea or vomiting.  Biopsy performed on January 13.  Patient tolerated well.  No complaints of hematuria or pain.     Objective:  Vital signs in last 24 hours:  Temp:  [97.7 F (36.5 C)-98.1 F (36.7 C)] 97.8 F (36.6 C) (01/14 1207) Pulse Rate:  [63-77] 63 (01/14 1207) Resp:  [17-18] 18 (01/14 1207) BP: (125-157)/(75-89) 125/77 (01/14 1207) SpO2:  [95 %-98 %] 95 % (01/14 1207)  Weight change:  Filed Weights   02/02/20 2236  Weight: 70.3 kg    Intake/Output:    Intake/Output Summary (Last 24 hours) at 02/08/2020 1701 Last data filed at 02/08/2020 0932 Gross per 24 hour  Intake 480 ml  Output 1875 ml  Net -1395 ml    Physical Exam: General:  No acute distress, laying in the bed  HEENT  anicteric, moist  oral mucous membrane  Pulm/lungs  normal breathing effort, clear to auscultation  CVS/Heart  irregular rhythm, no rub or gallop  Abdomen:   Soft, nontender  Extremities:  No peripheral edema  Neurologic:  Alert, oriented, able to follow commands  Skin:  No acute rashes    Basic Metabolic Panel:  Recent Labs  Lab 02/04/20 0741 02/05/20 0656 02/06/20 0259 02/07/20 0239 02/08/20 0211  NA 139 138 135 136 141  K 3.4* 3.5 3.2* 2.9* 3.6  CL 110 109 109 109 111  CO2 19* 16* 21* 21* 23  GLUCOSE 91 71 90 110* 126*  BUN 40* 48* 43* 40* 38*  CREATININE 3.58* 4.02* 4.00* 3.78* 3.61*  CALCIUM 7.0* 7.4* 6.9* 7.3* 6.9*     CBC: Recent Labs  Lab 02/04/20 0741 02/05/20 0656 02/06/20 0259 02/07/20 0239 02/08/20 0211  WBC 10.5 10.1 9.3 8.2 7.1  HGB 8.0* 8.2* 7.5* 8.4* 8.3*  HCT 23.7* 24.6* 22.6* 24.6* 24.9*  MCV 92.9 95.3 94.6 92.5 93.6  PLT 162 163 142* 159 178     No results found for: HEPBSAG, HEPBSAB, HEPBIGM    Microbiology:  Recent Results (from the past 240 hour(s))  Resp Panel by RT-PCR (Flu A&B, Covid) Nasopharyngeal Swab     Status: None   Collection Time: 02/03/20  7:36 AM   Specimen: Nasopharyngeal Swab; Nasopharyngeal(NP) swabs in vial transport medium  Result Value Ref Range  Status   SARS Coronavirus 2 by RT PCR NEGATIVE NEGATIVE Final    Comment: (NOTE) SARS-CoV-2 target nucleic acids are NOT DETECTED.  The SARS-CoV-2 RNA is generally detectable in upper respiratory specimens during the acute phase of infection. The lowest concentration of SARS-CoV-2 viral copies this assay can detect is 138 copies/mL. A negative result does not preclude SARS-Cov-2 infection and should not be used as the sole basis for treatment or other patient management decisions. A negative result may occur with  improper specimen collection/handling, submission of specimen other than nasopharyngeal swab, presence of viral mutation(s) within the areas targeted by this assay, and  inadequate number of viral copies(<138 copies/mL). A negative result must be combined with clinical observations, patient history, and epidemiological information. The expected result is Negative.  Fact Sheet for Patients:  EntrepreneurPulse.com.au  Fact Sheet for Healthcare Providers:  IncredibleEmployment.be  This test is no t yet approved or cleared by the [place] FDA and  has been authorized for detection and/or diagnosis of SARS-CoV-2 by FDA under an Emergency Use Authorization (EUA). This EUA will remain  in effect (meaning this test can be used) for the duration of the COVID-19 declaration under Section 564(b)(1) of the Act, 21 U.S.C.section 360bbb-3(b)(1), unless the authorization is terminated  or revoked sooner.       Influenza A by PCR NEGATIVE NEGATIVE Final   Influenza B by PCR NEGATIVE NEGATIVE Final    Comment: (NOTE) The Xpert Xpress SARS-CoV-2/FLU/RSV plus assay is intended as an aid in the diagnosis of influenza from Nasopharyngeal swab specimens and should not be used as a sole basis for treatment. Nasal washings and aspirates are unacceptable for Xpert Xpress SARS-CoV-2/FLU/RSV testing.  Fact Sheet for Patients: EntrepreneurPulse.com.au  Fact Sheet for Healthcare Providers: IncredibleEmployment.be  This test is not yet approved or cleared by the [place] FDA and has been authorized for detection and/or diagnosis of SARS-CoV-2 by FDA under an Emergency Use Authorization (EUA). This EUA will remain in effect (meaning this test can be used) for the duration of the COVID-19 declaration under Section 564(b)(1) of the Act, 21 U.S.C. section 360bbb-3(b)(1), unless the authorization is terminated or revoked.  Performed at [facility], [address] [place], [place] [address]     Coagulation Studies: Recent Labs    02/06/20 0950  LABPROT 15.2  INR 1.3*     Urinalysis: No results for input(s): COLORURINE, LABSPEC, PHURINE, GLUCOSEU, HGBUR, BILIRUBINUR, KETONESUR, PROTEINUR, UROBILINOGEN, NITRITE, LEUKOCYTESUR in the last 72 hours.  Invalid input(s): APPERANCEUR    Imaging: US BIOPSY (KIDNEY)  Result Date: 02/07/2020 INDICATION: 70 year old with acute kidney injury and proteinuria. Request for a random renal biopsy. EXAM: ULTRASOUND-GUIDED LEFT RENAL BIOPSY MEDICATIONS: None. ANESTHESIA/SEDATION: Moderate (conscious) sedation was employed during this procedure. A total of Versed 2.0 mg and Fentanyl 100 mcg was administered intravenously. Moderate Sedation Time: 12 minutes. The patient's level of consciousness and vital signs were monitored continuously by radiology nursing throughout the procedure under my direct supervision. FLUOROSCOPY TIME:  None COMPLICATIONS: None immediate. PROCEDURE: Informed written consent was obtained from the patient after a thorough discussion of the procedural risks, benefits and alternatives. All questions were addressed. A timeout was performed prior to the initiation of the procedure. Patient was placed prone. Both kidneys were evaluated with ultrasound. The left kidney was selected for biopsy. The left flank was prepped with chlorhexidine and sterile field was created. Skin and soft tissues were anesthetized using 1% lidocaine. A small incision was made. Using ultrasound guidance,  49 gauge core needle was directed into the left kidney lower pole cortex and a core biopsy was obtained. Core biopsy was placed on a Telfa pad with saline. A second 16 gauge core biopsy was obtained for the left kidney lower pole using ultrasound guidance. Second core biopsy was also placed on Telfa pad with saline. Specimens were felt to be adequate and no additional samples were obtained. Left kidney was evaluated with ultrasound following the biopsy. Bandage placed over the puncture site. FINDINGS: Negative for hydronephrosis. Exophytic cyst  along the right kidney lower pole. Two core biopsies were obtained from the left kidney lower pole. Two adequate specimens obtained. No immediate bleeding or hematoma formation. IMPRESSION: Successful ultrasound-guided core biopsies from the left kidney lower pole. Electronically Signed   By: Markus Daft M.D.   On: 02/07/2020 13:02     Medications:    Scheduled Meds: . amLODipine  5 mg Oral Daily  . cloNIDine  0.1 mg Oral BID  . feeding supplement  1 Container Oral TID BM  . gabapentin  300 mg Oral QHS  . isosorbide mononitrate  60 mg Oral Daily  . metoprolol succinate  200 mg Oral Daily  . pantoprazole  40 mg Oral BID  . rosuvastatin  10 mg Oral Daily  . sodium chloride flush  3 mL Intravenous Q12H   Continuous Infusions: . sodium chloride     PRN Meds:.sodium chloride, acetaminophen, fentaNYL, labetalol, midazolam, ondansetron (ZOFRAN) IV, oxyCODONE, promethazine, sodium chloride flush    Assessment/ Plan:  70 y.o. male with coronary artery disease, history of STEMI with stent in October 2003, PCI 2017.  Atrial fibrillation, diabetes, gout, chronic kidney disease  Principal Problem:   Hypertensive urgency Active Problems:   AKI (acute kidney injury) ([place])   Benign essential HTN   DM type 2 (diabetes mellitus, type 2) ([facility])   CAD (coronary artery disease)   Atrial fibrillation, chronic ([place])   Chronic kidney disease   #.  Acute kidney injury with CKD st 4 with baseline creatinine of 2.23, GFR 29 in October 2021.  Presenting creatinine of 2.66 Recent Labs    02/05/20 0656 02/06/20 0259 02/07/20 0239 02/08/20 0211  CREATININE 4.02* 4.00* 3.78* 3.61*  AKI likely secondary to accelerated hypertension Agree with restarting home medications Blood pressure control has improved overall   #Proteinuria Urine protein to creatinine ratio 15 g in October 2021 Serum kappa lambda ratio of 1.35, SPEP negative in October 2021 Screening serologies ordered, ANA neg, complements  normal Proteinuria and CKD not explained by hypertension and diabetes. Renal biopsy planned for later today   #. Anemia of CKD  Lab Results  Component Value Date   HGB 8.3 (L) 02/08/2020    #. HTN with CKD Current regimen includes amlodipine 5 mg daily Clonidine 0.1 mg twice a day Isosorbide 60 mg daily Metoprolol 200 mg daily Renal artery duplex exam neg.   #. Diabetes type 2 with CKD Hemoglobin A1C (%)  Date Value  09/27/2012 6.4 (H)   Hgb A1c MFr Bld (%)  Date Value  05/15/2019 6.0 (H)  Rosuvastatin 10 mg daily for cardiovascular risk reduction  #Atrial fibrillation Anticoagulation with Eliquis On hold for renal biopsy Can be restarted  #Hypokalemia Likely nutritional Aldosterone/renin levels pending Replace orally as necessary  Follow-up at [place] office on January 19 Wednesday at 9:40 AM    LOS: [place] 1/14/20225:01 PM  [place], [place]

## 2020-02-15 ENCOUNTER — Encounter: Payer: Self-pay | Admitting: Nephrology

## 2020-02-15 LAB — ALDOSTERONE + RENIN ACTIVITY W/ RATIO
ALDO / PRA Ratio: <6 (ref 0.0–30.0)
Aldosterone: <1 ng/dL (ref 0.0–30.0)
PRA LC/MS/MS: 0.168 ng/mL/hr (ref 0.167–5.380)

## 2020-02-15 LAB — SURGICAL PATHOLOGY

## 2020-02-19 DIAGNOSIS — D509 Iron deficiency anemia, unspecified: Secondary | ICD-10-CM | POA: Insufficient documentation

## 2020-02-19 DIAGNOSIS — Z09 Encounter for follow-up examination after completed treatment for conditions other than malignant neoplasm: Secondary | ICD-10-CM | POA: Insufficient documentation

## 2020-03-28 ENCOUNTER — Other Ambulatory Visit: Payer: Self-pay | Admitting: Orthopedic Surgery

## 2020-04-09 ENCOUNTER — Emergency Department
Admission: EM | Admit: 2020-04-09 | Discharge: 2020-04-09 | Disposition: A | Discharge status: Home / Self Care | Payer: Medicare PPO | Attending: Emergency Medicine | Admitting: Emergency Medicine

## 2020-04-09 ENCOUNTER — Encounter: Payer: Self-pay | Admitting: Emergency Medicine

## 2020-04-09 ENCOUNTER — Other Ambulatory Visit
Admission: RE | Admit: 2020-04-09 | Discharge: 2020-04-09 | Disposition: A | Discharge status: Home / Self Care | Payer: Medicare PPO | Source: Ambulatory Visit | Attending: Orthopedic Surgery | Admitting: Orthopedic Surgery

## 2020-04-09 ENCOUNTER — Other Ambulatory Visit: Payer: Self-pay

## 2020-04-09 DIAGNOSIS — E1122 Type 2 diabetes mellitus with diabetic chronic kidney disease: Secondary | ICD-10-CM | POA: Diagnosis not present

## 2020-04-09 DIAGNOSIS — Z955 Presence of coronary angioplasty implant and graft: Secondary | ICD-10-CM | POA: Insufficient documentation

## 2020-04-09 DIAGNOSIS — Z87891 Personal history of nicotine dependence: Secondary | ICD-10-CM | POA: Diagnosis not present

## 2020-04-09 DIAGNOSIS — Z79899 Other long term (current) drug therapy: Secondary | ICD-10-CM | POA: Insufficient documentation

## 2020-04-09 DIAGNOSIS — Z7982 Long term (current) use of aspirin: Secondary | ICD-10-CM | POA: Insufficient documentation

## 2020-04-09 DIAGNOSIS — R799 Abnormal finding of blood chemistry, unspecified: Secondary | ICD-10-CM | POA: Diagnosis present

## 2020-04-09 DIAGNOSIS — N189 Chronic kidney disease, unspecified: Secondary | ICD-10-CM | POA: Insufficient documentation

## 2020-04-09 DIAGNOSIS — Z96649 Presence of unspecified artificial hip joint: Secondary | ICD-10-CM | POA: Insufficient documentation

## 2020-04-09 DIAGNOSIS — Z01812 Encounter for preprocedural laboratory examination: Secondary | ICD-10-CM | POA: Insufficient documentation

## 2020-04-09 DIAGNOSIS — N179 Acute kidney failure, unspecified: Secondary | ICD-10-CM

## 2020-04-09 DIAGNOSIS — I129 Hypertensive chronic kidney disease with stage 1 through stage 4 chronic kidney disease, or unspecified chronic kidney disease: Secondary | ICD-10-CM | POA: Diagnosis not present

## 2020-04-09 DIAGNOSIS — Z96643 Presence of artificial hip joint, bilateral: Secondary | ICD-10-CM | POA: Insufficient documentation

## 2020-04-09 DIAGNOSIS — I25119 Atherosclerotic heart disease of native coronary artery with unspecified angina pectoris: Secondary | ICD-10-CM | POA: Diagnosis not present

## 2020-04-09 DIAGNOSIS — Z7901 Long term (current) use of anticoagulants: Secondary | ICD-10-CM | POA: Diagnosis not present

## 2020-04-09 LAB — COMPREHENSIVE METABOLIC PANEL
ALT: 15 U/L (ref 0–44)
AST: 15 U/L (ref 15–41)
Albumin: 2 g/dL — ABNORMAL LOW (ref 3.5–5.0)
Alkaline Phosphatase: 107 U/L (ref 38–126)
Anion gap: 9 (ref 5–15)
BUN: 59 mg/dL — ABNORMAL HIGH (ref 8–23)
CO2: 18 mmol/L — ABNORMAL LOW (ref 22–32)
Calcium: 6.7 mg/dL — ABNORMAL LOW (ref 8.9–10.3)
Chloride: 109 mmol/L (ref 98–111)
Creatinine, Ser: 4.59 mg/dL — ABNORMAL HIGH (ref 0.61–1.24)
GFR, Estimated: 13 mL/min — ABNORMAL LOW (ref >60)
Glucose, Bld: 152 mg/dL — ABNORMAL HIGH (ref 70–99)
Potassium: 3.4 mmol/L — ABNORMAL LOW (ref 3.5–5.1)
Sodium: 136 mmol/L (ref 135–145)
Total Bilirubin: 0.5 mg/dL (ref 0.3–1.2)
Total Protein: 5.3 g/dL — ABNORMAL LOW (ref 6.5–8.1)

## 2020-04-09 LAB — CBC
HCT: 25.3 % — ABNORMAL LOW (ref 39.0–52.0)
Hemoglobin: 8.3 g/dL — ABNORMAL LOW (ref 13.0–17.0)
MCH: 30.9 pg (ref 26.0–34.0)
MCHC: 32.8 g/dL (ref 30.0–36.0)
MCV: 94.1 fL (ref 80.0–100.0)
Platelets: 312 10*3/uL (ref 150–400)
RBC: 2.69 MIL/uL — ABNORMAL LOW (ref 4.22–5.81)
RDW: 13.6 % (ref 11.5–15.5)
WBC: 12.2 10*3/uL — ABNORMAL HIGH (ref 4.0–10.5)
nRBC: 0 % (ref 0.0–0.2)

## 2020-04-09 LAB — SURGICAL PCR SCREEN
MRSA, PCR: NEGATIVE
Staphylococcus aureus: NEGATIVE

## 2020-04-09 LAB — PROTIME-INR
INR: 1.1 (ref 0.8–1.2)
Prothrombin Time: 13.8 seconds (ref 11.4–15.2)

## 2020-04-09 LAB — APTT: aPTT: 39 seconds — ABNORMAL HIGH (ref 24–36)

## 2020-04-09 LAB — MAGNESIUM: Magnesium: 1.6 mg/dL — ABNORMAL LOW (ref 1.7–2.4)

## 2020-04-09 MED ORDER — SODIUM CHLORIDE 0.9 % IV BOLUS
500.0000 mL | Freq: Once | INTRAVENOUS | Status: AC
  Administered 2020-04-09: 500 mL via INTRAVENOUS

## 2020-04-09 MED ORDER — POTASSIUM CHLORIDE CRYS ER 20 MEQ PO TBCR
10.0000 meq | EXTENDED_RELEASE_TABLET | Freq: Once | ORAL | Status: AC
  Administered 2020-04-09: 10 meq via ORAL
  Filled 2020-04-09: qty 1

## 2020-04-09 NOTE — Discharge Instructions (Addendum)
Your kidney function was slightly worse than previous I given you some fluids and a little bit of potassium.  Follow-up with your nephrologist.  Return the ER for shortness of breath, leg swelling, dizziness or any other concerns

## 2020-04-09 NOTE — Patient Instructions (Addendum)
Your procedure is scheduled on: Tuesday, March 29 Report to the Registration Desk on the 1st floor of the [facility]. To find out your arrival time, please call [phone] between 1PM - 3PM on: Monday, March 28  REMEMBER: Instructions that are not followed completely may result in serious medical risk, up to and including death; or upon the discretion of your surgeon and anesthesiologist your surgery may need to be rescheduled.  Do not eat food after midnight the night before surgery.  No gum chewing, lozengers or hard candies.  You may however, drink water up to 2 hours before you are scheduled to arrive for your surgery. Do not drink anything within 2 hours of your scheduled arrival time.  TAKE THESE MEDICATIONS THE MORNING OF SURGERY WITH A SIP OF WATER:  1.  Amlodipine 2.  Clonidine 3.  Isosorbide mononitrate 4.  Metoprolol 5.  Omeprazole - (take one the night before and one on the morning of surgery - helps to prevent nausea after surgery.) 6.  Tramadol if needed for pain  Follow recommendations from Cardiologist regarding stopping Aspirin and Eliquis. Stop 3 days prior to surgery. Last day to take is Friday, March 25. Resume after surgery per Dr. Rudene Christians instuctions  One week prior to surgery: starting March 22 Stop Anti-inflammatories (NSAIDS) such as Advil, Aleve, Ibuprofen, Motrin, Naproxen, Naprosyn and Aspirin based products such as Excedrin, Goodys Powder, BC Powder. Stop ANY OVER THE COUNTER supplements until after surgery.  On the morning of surgery brush your teeth with toothpaste and water, you may rinse your mouth with mouthwash if you wish. Do not swallow any toothpaste or mouthwash.  Do not wear jewelry.  Do not wear lotions, powders, or perfumes.   Do not shave body from the neck down 48 hours prior to surgery just in case you cut yourself which could leave a site for infection.  Also, freshly shaved skin may become irritated if using the CHG soap.  Contact  lenses, hearing aids and dentures may not be worn into surgery.  Do not bring valuables to the hospital. [facility] is not responsible for any missing/lost belongings or valuables.   Use CHG Soap as directed on instruction sheet.  Notify your doctor if there is any change in your medical condition (cold, fever, infection).  Wear comfortable clothing (specific to your surgery type) to the hospital.  Plan for stool softeners for home use; pain medications have a tendency to cause constipation. You can also help prevent constipation by eating foods high in fiber such as fruits and vegetables and drinking plenty of fluids as your diet allows.  After surgery, you can help prevent lung complications by doing breathing exercises.  Take deep breaths and cough every 1-2 hours. Your doctor may order a device called an Incentive Spirometer to help you take deep breaths.  If you are being admitted to the hospital overnight, leave your suitcase in the car. After surgery it may be brought to your room.  If you are being discharged the day of surgery, you will not be allowed to drive home. You will need a responsible adult (18 years or older) to drive you home and stay with you that night.   If you are taking public transportation, you will need to have a responsible adult (18 years or older) with you. Please confirm with your physician that it is acceptable to use public transportation.   Please call the [facility]. at [phone] if you have any  questions about these instructions.  Surgery Visitation Policy:  Patients undergoing a surgery or procedure may have one family member or support person with them as long as that person is not COVID-19 positive or experiencing its symptoms.  That person may remain in the waiting area during the procedure.  Inpatient Visitation:    Visiting hours are 7 a.m. to 8 p.m. Inpatients will be allowed two visitors daily. The visitors may  change each day during the patient's stay. No visitors under the age of 33. Any visitor under the age of 76 must be accompanied by an adult. The visitor must pass COVID-19 screenings, use hand sanitizer when entering and exiting the patient's room and wear a mask at all times, including in the patient's room. Patients must also wear a mask when staff or their visitor are in the room. Masking is required regardless of vaccination status.

## 2020-04-09 NOTE — ED Provider Notes (Signed)
[facility] Emergency Department Provider Note  ____________________________________________   Event Date/Time   First MD Initiated Contact with Patient 04/09/20 2156     (approximate)  I have reviewed the triage vital signs and the nursing notes.   HISTORY  Chief Complaint Abnormal Lab    HPI Casey French is a 70 y.o. male with diabetes, CKD followed by Dr. Juleen China who comes in for abnormal labs.  Patient reports having nausea, vomiting, diarrhea for about a week about a week ago.  He is currently asymptomatic and feels himself except for a little bit of weakness.  Patient had labs done at his primary care doctor because he supposed to have surgery for a hip replacement and he wanted to have preop labs done to make sure that things are looking good.  Given his kidney function was elevated and his potassium was low they told him to come into the ER to be evaluated which is why patient is here today.  Patient fatigue is mild, constant, nothing makes it better, nothing makes it worse.  Patient has been compliant with his torsemide.  Still urinating well.  Denies any swelling          Past Medical History:  Diagnosis Date  . Anemia   . Anginal pain ([place])   . Atrial fibrillation ([place])   . Chronic kidney disease   . Coronary artery disease   . Depression   . Diabetes mellitus without complication ([place])   . Diverticulosis   . GERD (gastroesophageal reflux disease)   . Gout   . Heart murmur   . High cholesterol   . Hypertension   . Myocardial infarction [facility]) 2003    Patient Active Problem List   Diagnosis Date Noted  . Hematemesis 05/15/2019  . Hematochezia 05/15/2019  . Chronic anticoagulation 05/15/2019  . Atrial fibrillation, chronic ([place]) 05/15/2019  . GI bleed 05/15/2019  . Chronic kidney disease 05/15/2019  . Unstable angina ([place]) 11/10/2016  . Angina at rest [facility]) 05/01/2015  . Hypertensive urgency 06/14/2014  . Acute  diverticulitis 06/14/2014  . AKI (acute kidney injury) ([place]) 06/14/2014  . Benign essential HTN 06/14/2014  . Hyperlipidemia 06/14/2014  . DM type 2 (diabetes mellitus, type 2) ([place]) 06/14/2014  . CAD (coronary artery disease) 06/14/2014  . GERD without esophagitis 06/14/2014  . Closed head injury 03/24/2013    Past Surgical History:  Procedure Laterality Date  . CARDIAC CATHETERIZATION    . CATARACT EXTRACTION W/PHACO Right 07/05/2019   Procedure: CATARACT EXTRACTION PHACO AND INTRAOCULAR LENS PLACEMENT ([place]) RIGHT DIABETIC;  Surgeon: Birder Robson, MD;  Location: [facility] ORS;  Service: Ophthalmology;  Laterality: Right;  US00:59.3 CDE8.41 LOT [id number]  . COLONOSCOPY WITH PROPOFOL N/A 04/01/2017   Procedure: COLONOSCOPY WITH PROPOFOL;  Surgeon: Lollie Sails, MD;  Location: [facility] ENDOSCOPY;  Service: Endoscopy;  Laterality: N/A;  . CORONARY ANGIOPLASTY WITH STENT PLACEMENT  10/2001   RCA  . CORONARY ANGIOPLASTY WITH STENT PLACEMENT  07/2015   LAD  . right hand surgery  2015   3 screws in right hand    Prior to Admission medications   Medication Sig Start Date End Date Taking? Authorizing Provider  allopurinol (ZYLOPRIM) 100 MG tablet Take 100 mg by mouth daily.    [provider]  amLODipine (NORVASC) 10 MG tablet Take 10 mg by mouth daily.    [provider]  apixaban (ELIQUIS) 2.5 MG TABS tablet Take 2.5 mg by mouth daily.    [provider]  aspirin 81 MG EC tablet Take 81 mg by mouth daily.    [provider]  aspirin-acetaminophen-caffeine (EXCEDRIN MIGRAINE) [phone] MG tablet Take 1 tablet by mouth every 6 (six) hours as needed for headache.    [provider]  cloNIDine (CATAPRES) 0.1 MG tablet Take 1 tablet (0.1 mg total) by mouth 2 (two) times daily. 02/08/20 03/09/20  Wyvonnia Dusky, MD  fexofenadine (ALLEGRA) 180 MG tablet Take 180 mg by mouth daily as needed for allergies or rhinitis.    [provider]   fluticasone (FLONASE) 50 MCG/ACT nasal spray Place 2 sprays into both nostrils daily as needed for allergies. 04/30/14   [provider]  gabapentin (NEURONTIN) 300 MG capsule Take 300 mg by mouth at bedtime. 09/25/16   [provider]  hydrocortisone (ANUSOL-HC) 2.5 % rectal cream Place 1 application rectally 2 (two) times daily as needed for hemorrhoids or anal itching.    [provider]  isosorbide mononitrate (IMDUR) 60 MG 24 hr tablet Take 60 mg by mouth daily.    [provider]  lisinopril (ZESTRIL) 20 MG tablet Take 20 mg by mouth daily.    [provider]  metoprolol succinate (TOPROL-XL) 100 MG 24 hr tablet Take 150 mg by mouth daily. Take with or immediately following a meal.    [provider]  nitroGLYCERIN (NITROSTAT) 0.4 MG SL tablet Place 0.4 mg under the tongue every 5 (five) minutes as needed for chest pain.    [provider]  omeprazole (PRILOSEC) 20 MG capsule Take 20 mg by mouth daily.    [provider]  psyllium (METAMUCIL) 58.6 % packet Take 1 packet by mouth 2 (two) times daily.    [provider]  rosuvastatin (CRESTOR) 40 MG tablet Take 40 mg by mouth daily. 03/06/19   [provider]  torsemide (DEMADEX) 10 MG tablet Take 1 tablet (10 mg total) by mouth daily. Hold this medication until you see your nephrologist 02/08/20   Wyvonnia Dusky, MD  traMADol (ULTRAM) 50 MG tablet Take 50 mg by mouth every 6 (six) hours as needed for moderate pain.    [provider]    Allergies Morphine  Family History  Problem Relation Age of Onset  . CAD Other     Social History Social History   Tobacco Use  . Smoking status: Former Smoker    Types: Cigarettes    Quit date: 03/24/2001    Years since quitting: 19.0  . Smokeless tobacco: Never Used  Vaping Use  . Vaping Use: Never used  Substance Use Topics  . Alcohol use: No    Alcohol/week: 0.0 standard drinks  . Drug use: No       Review of Systems Constitutional: No fever/chills, mild fatigue Eyes: No visual changes. ENT: No sore throat. Cardiovascular: Denies chest pain. Respiratory: Denies shortness of breath. Gastrointestinal: No abdominal pain.  No nausea, no vomiting.  No diarrhea.  No constipation. Genitourinary: Negative for dysuria. Musculoskeletal: Negative for back pain. Skin: Negative for rash. Neurological: Negative for headaches, focal weakness or numbness. All other ROS negative ____________________________________________   PHYSICAL EXAM:  VITAL SIGNS: ED Triage Vitals  Enc Vitals Group     BP 04/09/20 1951 134/73     Pulse Rate 04/09/20 1951 80     Resp 04/09/20 1951 18     Temp 04/09/20 1951 97.8 F (36.6 C)     Temp Source 04/09/20 1951 Oral  SpO2 04/09/20 1951 100 %     Weight 04/09/20 1952 144 lb (65.3 kg)     Height 04/09/20 1952 '5\' 9"'$  (1.753 m)     Head Circumference --      Peak Flow --      Pain Score 04/09/20 1952 0     Pain Loc --      Pain Edu? --      Excl. in [place]? --     Constitutional: Alert and oriented. Well appearing and in no acute distress. Eyes: Conjunctivae are normal. EOMI. Head: Atraumatic. Nose: No congestion/rhinnorhea. Mouth/Throat: Mucous membranes are moist.   Neck: No stridor. Trachea Midline. FROM Cardiovascular: Normal rate, regular rhythm. Grossly normal heart sounds.  Good peripheral circulation. Respiratory: Normal respiratory effort.  No retractions. Lungs CTAB. Gastrointestinal: Soft and nontender. No distention. No abdominal bruits.  Musculoskeletal: No lower extremity tenderness nor edema.  No joint effusions. Neurologic:  Normal speech and language. No gross focal neurologic deficits are appreciated.  Skin:  Skin is warm, dry and intact. No rash noted. Psychiatric: Mood and affect are normal. Speech and behavior are normal. GU: Deferred   ____________________________________________   LABS (all labs ordered are listed,  but only abnormal results are displayed)  Labs Reviewed  CBC - Abnormal; Notable for the following components:      Result Value   WBC 12.2 (*)    RBC 2.69 (*)    Hemoglobin 8.3 (*)    HCT 25.3 (*)    All other components within normal limits  COMPREHENSIVE METABOLIC PANEL - Abnormal; Notable for the following components:   Potassium 3.4 (*)    CO2 18 (*)    Glucose, Bld 152 (*)    BUN 59 (*)    Creatinine, Ser 4.59 (*)    Calcium 6.7 (*)    Total Protein 5.3 (*)    Albumin 2.0 (*)    GFR, Estimated 13 (*)    All other components within normal limits  MAGNESIUM   ____________________________________________   ED ECG REPORT I, Vanessa [place], the attending physician, personally viewed and interpreted this ECG.  Normal sinus rate of 78, no ST elevation, little ST flattening in V4 through V6 but seems, similar to previous EKGs, LVH ____________________________________________  PROCEDURES  Procedure(s) performed (including Critical Care):  Procedures   ____________________________________________   INITIAL IMPRESSION / ASSESSMENT AND PLAN / ED COURSE  Casey French was evaluated in Emergency Department on 04/09/2020 for the symptoms described in the history of present illness. He was evaluated in the context of the global COVID-19 pandemic, which necessitated consideration that the patient might be at risk for infection with the SARS-CoV-2 virus that causes COVID-19. Institutional protocols and algorithms that pertain to the evaluation of patients at risk for COVID-19 are in a state of rapid change based on information released by regulatory bodies including the CDC and federal and state organizations. These policies and algorithms were followed during the patient's care in the ED.    Patient is a 70 year old with known CKD who comes in for concern for abnormal labs with slightly increased creatinine from baseline.  Patient normally runs at 4 now up to 4.5 in the setting  of having nausea vomiting diarrhea.  Symptoms have now resolved.  Most likely patient had gastroenteritis leading to some dehydration.   Denies any chest pain or shortness of breath to suggest PE or ACS  No evidence of swelling on exam.  We will give a little bit  of fluids.  He  Does not look fluid overloaded  Potassium also slightly low but again I do not want to overcorrect him given his poor kidneys only give 10 mEq.  We will add on a magnesium. Slightly low but dont want to give mgox and cause diarrhea. Not significantly low that needs repletion.  No indication for dialysis at this point.  I did discuss with Dr. Candiss Norse who agrees patient is otherwise well-appearing that he can follow-up outpatient with Dr. Juleen China.         ____________________________________________   FINAL CLINICAL IMPRESSION(S) / ED DIAGNOSES   Final diagnoses:  Acute renal failure superimposed on chronic kidney disease, unspecified CKD stage, unspecified acute renal failure type [facility])      MEDICATIONS GIVEN DURING THIS VISIT:  Medications  sodium chloride 0.9 % bolus 500 mL (0 mLs Intravenous Stopped 04/09/20 2309)  potassium chloride SA (KLOR-CON) CR tablet 10 mEq (10 mEq Oral Given 04/09/20 2240)     ED Discharge Orders    None       Note:  This document was prepared using Dragon voice recognition software and may include unintentional dictation errors.   Vanessa [place], MD 04/09/20 [phone]

## 2020-04-09 NOTE — ED Triage Notes (Signed)
Patient states that he was called by his PCP this evening and told to come here for a low potassium and IV fluids. Patient states that his PCP told him that he was dehydrated. Patient states that he had vomiting and diarrhea times one week but he is feeling better.

## 2020-04-14 ENCOUNTER — Encounter: Payer: Self-pay | Admitting: Orthopedic Surgery

## 2020-04-14 NOTE — Progress Notes (Signed)
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 Clinical nursing staff has updated patient's PMH/PSHx, current medication list, and drug allergies/intolerances to ensure comprehensive history available to assist in medical decision making as it pertains to the aforementioned surgical procedure and anticipated anesthetic course.   Clinical Discussion:  Casey French is a 70 y.o. male who is submitted for pre-surgical anesthesia review and clearance prior to him undergoing the above procedure. Patient is a Former Smoker (quit 02/2001). Pertinent PMH includes: CAD, inferior STEMI (2003) atrial fibrillation, cardiac murmur, angina, aortic atherosclerosis, HTN, HLD, T2DM, GERD (on daily PPI), CKD, anemia.  Patient is followed by cardiology Saralyn Pilar, MD). He was last seen in the cardiology clinic on 02/18/2020; notes reviewed.  At the time of his clinic visit, patient was being seen in follow-up following hospital admission to [facility] from 02/03/2020 through 02/08/2020 for hypertension urgency, acute on chronic kidney disease, chest pain, and GI bleeding.  Since discharge from the hospital, patient doing well overall; "feels better".  Patient denied any chest pain, however did report exertional dyspnea.  Patient denied PND, orthopnea, palpitations, vertiginous symptoms, and presyncope/syncope.  Patient with chronic lower extremity edema. Patient with a PMH significant for  cardiovascular disease (CAD).     Patient suffered an inferior wall STEMI in 2003 that was treated with PCI and Cypher stent placement to the RCA.    Repeat cardiac catheterization in 08/2003 revealed an occluded distal RCA with residual 60% stenosis of proximal LAD.   Last cardiac catheterization procedure was performed on 08/21/2015 revealing occlusion of the RCA at the site of his prior stenting, 70% mid LAD stenosis and 50% stenosis of the distal LAD.  PCI performed and DES placed to the mid LAD.   Last TTE performed on 02/04/2020 revealed normal left-ventricular systolic function with an EF of 60-65% and trivial mitral and tricuspid valve regurgitation.  Patient also has a history of atrial fibrillation.  CHA2DS2-VASc Score = 4 (age, HTN, previous MI, T2DM). Patient chronically anticoagulated using apixaban; compliant with therapy with no evidence of GI bleeding. Patient on GDMT for his HTN and HLD diagnoses.  Blood pressure moderately controlled at 140/72 on currently prescribed CCB, nitrate, ACEi, beta-blocker, and diuretic therapies.  Patient is on a statin for his HLD. T2DM well controlled on currently prescribed regimen; Hgb A1c 6.2% when last checked on 09/25/2019. Functional capacity, as defined by DASI, is documented as being >/= 4 METS.  Decision was made to repeat patient's myocardial perfusion imaging study in order to appropriately stratify his risk prior to upcoming orthopedic surgery. Myocardial perfusion imaging study performed on 03/06/2020 revealed small perfusion abnormality of mild intensity in the inferior region.  There were no wall motion abnormalities.  LVEF 57% (see full interpretation of cardiovascular test below). No changes were made to patient's medication regimen.  Patient to follow-up with outpatient cardiology in 3 months or sooner if needed.  Patient is scheduled to undergo an elective orthopedic procedure on 04/22/2020 with Dr.  Hessie Knows.  Given patient's past  medical history significant for cardiovascular and renovascular disease, presurgical cardiac and nephrology clearances were sought by the performing surgeon's office and PAT team.  Specialty clearances were obtained from the aforementioned specialties as follows:   Per cardiology, "this patient is optimized for surgery from a cardiovascular perspective.  He may proceed with the planned elective orthopedic procedure with an overall MODERATE risk for perioperative cardiovascular complications".  Again this patient is on daily anticoagulation therapy.  Patient has been on recommendations for holding his daily low-dose ASA and apixaban for 3 days prior to his procedure with plans to restart as soon as postoperative bleeding risk felt to be minimized by his attending surgeon.  Patient has been instructed that his last dose of anticoagulant will be on 04/18/2020.   Per nephrology, "this patient is optimized for surgery from a nephrology perspective and may proceed with an overall MODERATE risk of complications".  Patient denies previous perioperative complications with anesthesia in the past. In review of the available records, it is noted that patient underwent a MAC anesthetic course here (ASA III) in 06/2019 without documented complications.   Vitals with BMI 04/09/2020 04/09/2020 04/09/2020  Height - - _0   Weight - - 144 lbs  BMI - - 76.22  Systolic 633 354 -  Diastolic 76 78 -  Pulse 73 75 -    Providers/Specialists:   NOTE: Primary physician provider listed below. Patient may have been seen by APP or partner within same practice.   PROVIDER ROLE / SPECIALTY LAST Fabio Bering, MD Orthopedics (Surgeon)  04/10/2020  [name], MD Primary Care Provider  04/09/2020  Isaias Cowman, MD Cardiology  02/18/2020  Jac Canavan, MD Nephrology  02/08/2020   Allergies:  Morphine  Current Home Medications:   No current facility-administered medications for this encounter.   Marland Kitchen  allopurinol (ZYLOPRIM) 100 MG tablet  . amLODipine (NORVASC) 10 MG tablet  . apixaban (ELIQUIS) 2.5 MG TABS tablet  . aspirin 81 MG EC tablet  . aspirin-acetaminophen-caffeine (EXCEDRIN MIGRAINE) 250-250-65 MG tablet  . fexofenadine (ALLEGRA) 180 MG tablet  . fluticasone (FLONASE) 50 MCG/ACT nasal spray  . gabapentin (NEURONTIN) 300 MG capsule  . hydrocortisone (ANUSOL-HC) 2.5 % rectal cream  . isosorbide mononitrate (IMDUR) 60 MG 24 hr tablet  . lisinopril (ZESTRIL) 20 MG tablet  . metoprolol succinate (TOPROL-XL) 100 MG 24 hr tablet  . nitroGLYCERIN (NITROSTAT) 0.4 MG SL tablet  . omeprazole (PRILOSEC) 20 MG capsule  . psyllium (METAMUCIL) 58.6 % packet  . rosuvastatin (CRESTOR) 40 MG tablet  . torsemide (DEMADEX) 10 MG tablet  . traMADol (ULTRAM) 50 MG tablet  . cloNIDine (CATAPRES) 0.1 MG tablet   History:   Past Medical History:  Diagnosis Date  . Anemia   . Anginal pain ([place])   . Aortic atherosclerosis ([place])   . Atrial fibrillation ([place])   . CKD (chronic kidney disease) stage 3, GFR 30-59 ml/min (HCC)   . Coronary artery disease   . Depression   . Diabetes mellitus without complication ([place])   . Diverticulosis   . GERD (gastroesophageal reflux disease)   . Gout   . Heart murmur   . High cholesterol   . Hypertension   . STEMI (ST elevation myocardial infarction) [facility]) 2003   Inferior   Past Surgical History:  Procedure Laterality Date  . CARDIAC CATHETERIZATION    . CATARACT EXTRACTION W/PHACO Right 07/05/2019   Procedure: CATARACT EXTRACTION PHACO AND INTRAOCULAR LENS PLACEMENT (  IOC) RIGHT DIABETIC;  Surgeon: Birder Robson, MD;  Location: [facility] ORS;  Service: Ophthalmology;  Laterality: Right;  US00:59.3 CDE8.41 LOT [id number] h  . COLONOSCOPY WITH PROPOFOL N/A 04/01/2017   Procedure: COLONOSCOPY WITH PROPOFOL;  Surgeon: Lollie Sails, MD;  Location: [facility] ENDOSCOPY;  Service: Endoscopy;  Laterality: N/A;  . CORONARY ANGIOPLASTY WITH STENT PLACEMENT  10/2001    RCA  . CORONARY ANGIOPLASTY WITH STENT PLACEMENT  07/2015   LAD  . right hand surgery  2015   3 screws in right hand   Family History  Problem Relation Age of Onset  . CAD Other    Social History   Tobacco Use  . Smoking status: Former Smoker    Types: Cigarettes    Quit date: 03/24/2001    Years since quitting: 19.0  . Smokeless tobacco: Never Used  Vaping Use  . Vaping Use: Never used  Substance Use Topics  . Alcohol use: No    Alcohol/week: 0.0 standard drinks  . Drug use: No    Pertinent Clinical Results:  LABS: Labs reviewed: Acceptable for surgery.  No visits with results within 3 Day(s) from this visit.  Latest known visit with results is:  Admission on 04/09/2020, Discharged on 04/09/2020  Component Date Value Ref Range Status  . WBC 04/09/2020 12.2* 4.0 - 10.5 K/uL Final  . RBC 04/09/2020 2.69* 4.22 - 5.81 MIL/uL Final  . Hemoglobin 04/09/2020 8.3* 13.0 - 17.0 g/dL Final  . HCT 04/09/2020 25.3* 39.0 - 52.0 % Final  . MCV 04/09/2020 94.1  80.0 - 100.0 fL Final  . MCH 04/09/2020 30.9  26.0 - 34.0 pg Final  . MCHC 04/09/2020 32.8  30.0 - 36.0 g/dL Final  . RDW 04/09/2020 13.6  11.5 - 15.5 % Final  . Platelets 04/09/2020 312  150 - 400 K/uL Final  . nRBC 04/09/2020 0.0  0.0 - 0.2 % Final   Performed at [facility], [address], [place], [place] [address]  . Sodium 04/09/2020 136  135 - 145 mmol/L Final  . Potassium 04/09/2020 3.4* 3.5 - 5.1 mmol/L Final  . Chloride 04/09/2020 109  98 - 111 mmol/L Final  . CO2 04/09/2020 18* 22 - 32 mmol/L Final  . Glucose, Bld 04/09/2020 152* 70 - 99 mg/dL Final   Glucose reference range applies only to samples taken after fasting for at least 8 hours.  . BUN 04/09/2020 59* 8 - 23 mg/dL Final  . Creatinine, Ser 04/09/2020 4.59* 0.61 - 1.24 mg/dL Final  . Calcium 04/09/2020 6.7* 8.9 - 10.3 mg/dL Final  . Total Protein 04/09/2020 5.3* 6.5 - 8.1 g/dL Final  . Albumin 04/09/2020 2.0* 3.5 - 5.0 g/dL Final  . AST  04/09/2020 15  15 - 41 U/L Final  . ALT 04/09/2020 15  0 - 44 U/L Final  . Alkaline Phosphatase 04/09/2020 107  38 - 126 U/L Final  . Total Bilirubin 04/09/2020 0.5  0.3 - 1.2 mg/dL Final  . GFR, Estimated 04/09/2020 13* >60 mL/min Final   Comment: (NOTE) Calculated using the CKD-EPI Creatinine Equation (2021)   . Anion gap 04/09/2020 9  5 - 15 Final   Performed at [facility], [address] [place], [place] [address]  . Magnesium 04/09/2020 1.6* 1.7 - 2.4 mg/dL Final   Performed at [facility], [address] [place],  [address]    ECG: Date: 04/10/2020 Time ECG obtained: 1956 PM Rate: 78 bpm Rhythm: normal sinus Axis (leads I and aVF):  Normal Intervals: PR 138 ms. QRS 96 ms. QTc 458 ms. ST segment and T wave changes: Nonspecific ST and T wave abnormalities Comparison: Similar to previous tracing obtained on 02/02/2020   IMAGING / PROCEDURES: LEXISCAN performed on 03/06/2020 1. LVEF 57% 2. Regional wall motion reveals normal myocardial thickening and wall motion  3. There were no artifacts noted  4. Left ventricular cavity size normal 5. Small perfusion abnormality of mild intensity present in the inferior region on stress imaging 6. The overall quality of study is good  ECHOCARDIOGRAM performed on 02/04/2020 1. Left ventricular ejection fraction, by estimation, is 60 to 65%.  2. The left ventricle has normal function.  3. The left ventricle has no regional wall motion abnormalities.  4. Left ventricular diastolic parameters were normal.  5. Right ventricular systolic function is normal.  6. The right ventricular size is normal. 7. The mitral valve is grossly normal. Trivial mitral valve regurgitation.  8. The aortic valve is normal in structure. Aortic valve regurgitation is not visualized.   LEFT HEART CATHETERIZATION AND CORONARY ANGIOGRAPHY performed on 08/21/2015 1. RCA is occluded at the site of the prior stent 2. 70% mid LAD  stenosis and 50% distal LAD stenosis.    FFR in distal LAD was 0.70 and in the mid LAD was 0.79 3. Myocardial bridging and distal LAD in the segment of the artery within the distal LAD stenosis 4. Normal LVEDP of 9 mmHg 5. Successful PCI of the mid LAD with placement of an Xience drug-eluting stent  Impression and Plan:  TITAN KARNER has been referred for pre-anesthesia review and clearance prior to him undergoing the planned anesthetic and procedural courses. Available labs, pertinent testing, and imaging results were personally reviewed by me. This patient has been appropriately cleared by cardiology and nephrology with overall MODERATE risks of significant perioperative complications.  Based on clinical review performed today (04/14/20), barring any significant acute changes in the patient's overall condition, it is anticipated that he will be able to proceed with the planned surgical intervention. Any acute changes in clinical condition may necessitate his procedure being postponed and/or cancelled. Pre-surgical instructions were reviewed with the patient during his PAT appointment and questions were fielded by PAT clinical staff.  Honor Loh, MSN, APRN, FNP-C, CEN [facility]  Peri-operative Services Nurse Practitioner Phone: [phone] 04/14/20 12:35 PM  NOTE: This note has been prepared using Dragon dictation software. Despite my best ability to proofread, there is always the potential that unintentional transcriptional errors may still occur from this process.

## 2020-04-18 ENCOUNTER — Other Ambulatory Visit
Admission: RE | Admit: 2020-04-18 | Discharge: 2020-04-18 | Disposition: A | Discharge status: Home / Self Care | Payer: Medicare PPO | Source: Ambulatory Visit | Attending: Orthopedic Surgery | Admitting: Orthopedic Surgery

## 2020-04-18 ENCOUNTER — Other Ambulatory Visit: Payer: Self-pay

## 2020-04-18 DIAGNOSIS — Z01812 Encounter for preprocedural laboratory examination: Secondary | ICD-10-CM | POA: Diagnosis present

## 2020-04-18 DIAGNOSIS — Z20822 Contact with and (suspected) exposure to covid-19: Secondary | ICD-10-CM | POA: Diagnosis not present

## 2020-04-18 LAB — SARS CORONAVIRUS 2 (TAT 6-24 HRS): SARS Coronavirus 2: NEGATIVE

## 2020-04-21 MED ORDER — CEFAZOLIN SODIUM-DEXTROSE 2-4 GM/100ML-% IV SOLN
2.0000 g | INTRAVENOUS | Status: AC
  Administered 2020-04-22: 2 g via INTRAVENOUS

## 2020-04-21 MED ORDER — CHLORHEXIDINE GLUCONATE 0.12 % MT SOLN: 15.0000 mL | Freq: Once | OROMUCOSAL | Status: AC

## 2020-04-21 MED ORDER — ORAL CARE MOUTH RINSE: 15.0000 mL | Freq: Once | OROMUCOSAL | Status: AC

## 2020-04-21 MED ORDER — SODIUM CHLORIDE 0.9 % IV SOLN: INTRAVENOUS | Status: DC

## 2020-04-22 ENCOUNTER — Inpatient Hospital Stay: Payer: Medicare PPO | Admitting: Urgent Care

## 2020-04-22 ENCOUNTER — Inpatient Hospital Stay: Payer: Medicare PPO

## 2020-04-22 ENCOUNTER — Other Ambulatory Visit: Payer: Self-pay

## 2020-04-22 ENCOUNTER — Encounter: Payer: Self-pay | Admitting: Orthopedic Surgery

## 2020-04-22 ENCOUNTER — Inpatient Hospital Stay
Admission: RE | Admit: 2020-04-22 | Discharge: 2020-04-26 | DRG: 470 | Disposition: A | Discharge status: Home Health | Payer: Medicare PPO | Attending: Orthopedic Surgery | Admitting: Orthopedic Surgery

## 2020-04-22 ENCOUNTER — Encounter
Admission: RE | Disposition: A | Discharge status: Home Health | Payer: Self-pay | Source: Home / Self Care | Attending: Orthopedic Surgery

## 2020-04-22 DIAGNOSIS — G8918 Other acute postprocedural pain: Secondary | ICD-10-CM

## 2020-04-22 DIAGNOSIS — D62 Acute posthemorrhagic anemia: Secondary | ICD-10-CM | POA: Diagnosis not present

## 2020-04-22 DIAGNOSIS — Z961 Presence of intraocular lens: Secondary | ICD-10-CM | POA: Diagnosis present

## 2020-04-22 DIAGNOSIS — D631 Anemia in chronic kidney disease: Secondary | ICD-10-CM | POA: Diagnosis present

## 2020-04-22 DIAGNOSIS — Z8249 Family history of ischemic heart disease and other diseases of the circulatory system: Secondary | ICD-10-CM | POA: Diagnosis not present

## 2020-04-22 DIAGNOSIS — Z823 Family history of stroke: Secondary | ICD-10-CM | POA: Diagnosis not present

## 2020-04-22 DIAGNOSIS — E7800 Pure hypercholesterolemia, unspecified: Secondary | ICD-10-CM | POA: Diagnosis present

## 2020-04-22 DIAGNOSIS — N1832 Chronic kidney disease, stage 3b: Secondary | ICD-10-CM | POA: Diagnosis present

## 2020-04-22 DIAGNOSIS — Z9841 Cataract extraction status, right eye: Secondary | ICD-10-CM

## 2020-04-22 DIAGNOSIS — Z8719 Personal history of other diseases of the digestive system: Secondary | ICD-10-CM

## 2020-04-22 DIAGNOSIS — F32A Depression, unspecified: Secondary | ICD-10-CM | POA: Diagnosis present

## 2020-04-22 DIAGNOSIS — I700 Atherosclerosis of aorta: Secondary | ICD-10-CM | POA: Diagnosis present

## 2020-04-22 DIAGNOSIS — Z87891 Personal history of nicotine dependence: Secondary | ICD-10-CM

## 2020-04-22 DIAGNOSIS — Z419 Encounter for procedure for purposes other than remedying health state, unspecified: Secondary | ICD-10-CM

## 2020-04-22 DIAGNOSIS — Z7901 Long term (current) use of anticoagulants: Secondary | ICD-10-CM

## 2020-04-22 DIAGNOSIS — K219 Gastro-esophageal reflux disease without esophagitis: Secondary | ICD-10-CM | POA: Diagnosis present

## 2020-04-22 DIAGNOSIS — I2510 Atherosclerotic heart disease of native coronary artery without angina pectoris: Secondary | ICD-10-CM | POA: Diagnosis present

## 2020-04-22 DIAGNOSIS — Z885 Allergy status to narcotic agent status: Secondary | ICD-10-CM

## 2020-04-22 DIAGNOSIS — Z955 Presence of coronary angioplasty implant and graft: Secondary | ICD-10-CM

## 2020-04-22 DIAGNOSIS — E785 Hyperlipidemia, unspecified: Secondary | ICD-10-CM | POA: Diagnosis present

## 2020-04-22 DIAGNOSIS — I252 Old myocardial infarction: Secondary | ICD-10-CM | POA: Diagnosis not present

## 2020-04-22 DIAGNOSIS — Z96649 Presence of unspecified artificial hip joint: Secondary | ICD-10-CM

## 2020-04-22 DIAGNOSIS — M87852 Other osteonecrosis, left femur: Secondary | ICD-10-CM | POA: Diagnosis present

## 2020-04-22 DIAGNOSIS — I482 Chronic atrial fibrillation, unspecified: Secondary | ICD-10-CM | POA: Diagnosis present

## 2020-04-22 DIAGNOSIS — Z79899 Other long term (current) drug therapy: Secondary | ICD-10-CM | POA: Diagnosis not present

## 2020-04-22 DIAGNOSIS — Z7982 Long term (current) use of aspirin: Secondary | ICD-10-CM

## 2020-04-22 DIAGNOSIS — E1122 Type 2 diabetes mellitus with diabetic chronic kidney disease: Secondary | ICD-10-CM | POA: Diagnosis present

## 2020-04-22 DIAGNOSIS — I129 Hypertensive chronic kidney disease with stage 1 through stage 4 chronic kidney disease, or unspecified chronic kidney disease: Secondary | ICD-10-CM | POA: Diagnosis present

## 2020-04-22 DIAGNOSIS — Z801 Family history of malignant neoplasm of trachea, bronchus and lung: Secondary | ICD-10-CM

## 2020-04-22 HISTORY — PX: TOTAL HIP ARTHROPLASTY: SHX124

## 2020-04-22 HISTORY — DX: Chronic kidney disease, stage 3 unspecified: N18.30

## 2020-04-22 HISTORY — DX: Atherosclerosis of aorta: I70.0

## 2020-04-22 LAB — GLUCOSE, CAPILLARY
Glucose-Capillary: 88 mg/dL (ref 70–99)
Glucose-Capillary: 103 mg/dL — ABNORMAL HIGH (ref 70–99)

## 2020-04-22 SURGERY — ARTHROPLASTY, HIP, TOTAL, ANTERIOR APPROACH
Anesthesia: Spinal | Site: Hip | Laterality: Left

## 2020-04-22 MED ORDER — SODIUM CHLORIDE 0.9 % IV SOLN
INTRAVENOUS | Status: DC | PRN
  Administered 2020-04-22: 20 ug/min via INTRAVENOUS

## 2020-04-22 MED ORDER — FENTANYL CITRATE (PF) 100 MCG/2ML IJ SOLN
INTRAMUSCULAR | Status: AC
  Administered 2020-04-22: 50 ug via INTRAVENOUS
  Filled 2020-04-22: qty 2

## 2020-04-22 MED ORDER — LISINOPRIL 20 MG PO TABS
20.0000 mg | ORAL_TABLET | Freq: Every day | ORAL | Status: DC
  Administered 2020-04-23 – 2020-04-26 (×4): 20 mg via ORAL
  Filled 2020-04-22 (×4): qty 1

## 2020-04-22 MED ORDER — ACETAMINOPHEN 325 MG PO TABS
325.0000 mg | ORAL_TABLET | Freq: Four times a day (QID) | ORAL | Status: DC | PRN
Start: 2020-04-23 — End: 2020-04-26

## 2020-04-22 MED ORDER — ONDANSETRON HCL 4 MG/2ML IJ SOLN
4.0000 mg | Freq: Four times a day (QID) | INTRAMUSCULAR | Status: DC | PRN
  Administered 2020-04-23: 4 mg via INTRAVENOUS
  Filled 2020-04-22: qty 2

## 2020-04-22 MED ORDER — LORATADINE 10 MG PO TABS
10.0000 mg | ORAL_TABLET | Freq: Every day | ORAL | Status: DC
  Administered 2020-04-23 – 2020-04-26 (×4): 10 mg via ORAL
  Filled 2020-04-22 (×4): qty 1

## 2020-04-22 MED ORDER — HYDROCODONE-ACETAMINOPHEN 5-325 MG PO TABS
1.0000 | ORAL_TABLET | ORAL | Status: DC | PRN
Start: 2020-04-22 — End: 2020-04-26
  Administered 2020-04-22 – 2020-04-23 (×3): 2 via ORAL
  Administered 2020-04-24: 1 via ORAL
  Administered 2020-04-25 (×2): 2 via ORAL
  Filled 2020-04-22 (×6): qty 2
  Filled 2020-04-22: qty 1

## 2020-04-22 MED ORDER — ASPIRIN EC 81 MG PO TBEC
81.0000 mg | DELAYED_RELEASE_TABLET | Freq: Every day | ORAL | Status: DC
  Administered 2020-04-22 – 2020-04-26 (×5): 81 mg via ORAL
  Filled 2020-04-22 (×5): qty 1

## 2020-04-22 MED ORDER — CLONIDINE HCL 0.1 MG PO TABS
0.1000 mg | ORAL_TABLET | Freq: Two times a day (BID) | ORAL | Status: DC
  Administered 2020-04-22 – 2020-04-26 (×8): 0.1 mg via ORAL
  Filled 2020-04-22 (×8): qty 1

## 2020-04-22 MED ORDER — CEFAZOLIN SODIUM-DEXTROSE 2-4 GM/100ML-% IV SOLN
INTRAVENOUS | Status: AC
  Filled 2020-04-22: qty 100

## 2020-04-22 MED ORDER — PHENOL 1.4 % MT LIQD
1.0000 | OROMUCOSAL | Status: DC | PRN
  Filled 2020-04-22: qty 177

## 2020-04-22 MED ORDER — APIXABAN 2.5 MG PO TABS
2.5000 mg | ORAL_TABLET | Freq: Two times a day (BID) | ORAL | Status: DC
  Administered 2020-04-23 – 2020-04-26 (×7): 2.5 mg via ORAL
  Filled 2020-04-22 (×7): qty 1

## 2020-04-22 MED ORDER — CHLORHEXIDINE GLUCONATE 0.12 % MT SOLN
OROMUCOSAL | Status: AC
  Administered 2020-04-22: 15 mL via OROMUCOSAL
  Filled 2020-04-22: qty 15

## 2020-04-22 MED ORDER — PANTOPRAZOLE SODIUM 40 MG PO TBEC: 40.0000 mg | DELAYED_RELEASE_TABLET | Freq: Every day | ORAL | Status: DC

## 2020-04-22 MED ORDER — ROSUVASTATIN CALCIUM 10 MG PO TABS
40.0000 mg | ORAL_TABLET | Freq: Every day | ORAL | Status: DC
  Administered 2020-04-23 – 2020-04-26 (×4): 40 mg via ORAL
  Filled 2020-04-22 (×4): qty 4

## 2020-04-22 MED ORDER — PHENYLEPHRINE HCL (PRESSORS) 10 MG/ML IV SOLN
INTRAVENOUS | Status: DC | PRN
  Administered 2020-04-22 (×2): 100 ug via INTRAVENOUS

## 2020-04-22 MED ORDER — HYDROCORTISONE (PERIANAL) 2.5 % EX CREA
1.0000 "application " | TOPICAL_CREAM | Freq: Two times a day (BID) | CUTANEOUS | Status: DC | PRN
  Filled 2020-04-22: qty 28.35

## 2020-04-22 MED ORDER — OXYCODONE HCL 5 MG/5ML PO SOLN
5.0000 mg | Freq: Once | ORAL | Status: AC | PRN
Start: 2020-04-22 — End: 2020-04-22

## 2020-04-22 MED ORDER — HYDROCODONE-ACETAMINOPHEN 7.5-325 MG PO TABS
1.0000 | ORAL_TABLET | ORAL | Status: DC | PRN
  Filled 2020-04-22: qty 2

## 2020-04-22 MED ORDER — FLUTICASONE PROPIONATE 50 MCG/ACT NA SUSP
2.0000 | Freq: Every day | NASAL | Status: DC | PRN
  Filled 2020-04-22: qty 16

## 2020-04-22 MED ORDER — SODIUM CHLORIDE 0.9 % IV SOLN
INTRAVENOUS | Status: DC | PRN
  Administered 2020-04-22: 60 mL

## 2020-04-22 MED ORDER — TORSEMIDE 20 MG PO TABS
10.0000 mg | ORAL_TABLET | Freq: Every day | ORAL | Status: DC
  Administered 2020-04-23 – 2020-04-26 (×4): 10 mg via ORAL
  Filled 2020-04-22 (×4): qty 1

## 2020-04-22 MED ORDER — PROPOFOL 500 MG/50ML IV EMUL
INTRAVENOUS | Status: AC
  Filled 2020-04-22: qty 50

## 2020-04-22 MED ORDER — PSYLLIUM 95 % PO PACK
1.0000 | PACK | Freq: Every day | ORAL | Status: DC
  Administered 2020-04-23 – 2020-04-26 (×4): 1 via ORAL
  Filled 2020-04-22 (×5): qty 1

## 2020-04-22 MED ORDER — FENTANYL CITRATE (PF) 100 MCG/2ML IJ SOLN
25.0000 ug | INTRAMUSCULAR | Status: DC | PRN
Start: 2020-04-22 — End: 2020-04-22
  Administered 2020-04-22 (×2): 25 ug via INTRAVENOUS

## 2020-04-22 MED ORDER — ZOLPIDEM TARTRATE 5 MG PO TABS
5.0000 mg | ORAL_TABLET | Freq: Every evening | ORAL | Status: DC | PRN
  Administered 2020-04-24: 5 mg via ORAL
  Filled 2020-04-22: qty 1

## 2020-04-22 MED ORDER — ISOSORBIDE MONONITRATE ER 30 MG PO TB24
60.0000 mg | ORAL_TABLET | Freq: Every day | ORAL | Status: DC
  Administered 2020-04-23 – 2020-04-26 (×4): 60 mg via ORAL
  Filled 2020-04-22 (×4): qty 2

## 2020-04-22 MED ORDER — ALLOPURINOL 100 MG PO TABS
100.0000 mg | ORAL_TABLET | Freq: Every day | ORAL | Status: DC
  Administered 2020-04-23 – 2020-04-26 (×4): 100 mg via ORAL
  Filled 2020-04-22 (×5): qty 1

## 2020-04-22 MED ORDER — PANTOPRAZOLE SODIUM 40 MG PO TBEC
80.0000 mg | DELAYED_RELEASE_TABLET | Freq: Every day | ORAL | Status: DC
  Administered 2020-04-23 – 2020-04-26 (×4): 80 mg via ORAL
  Filled 2020-04-22 (×4): qty 2

## 2020-04-22 MED ORDER — MIDAZOLAM HCL 5 MG/5ML IJ SOLN
INTRAMUSCULAR | Status: DC | PRN
  Administered 2020-04-22: 2 mg via INTRAVENOUS

## 2020-04-22 MED ORDER — GABAPENTIN 300 MG PO CAPS
300.0000 mg | ORAL_CAPSULE | Freq: Every day | ORAL | Status: DC
  Administered 2020-04-22 – 2020-04-25 (×4): 300 mg via ORAL
  Filled 2020-04-22 (×4): qty 1

## 2020-04-22 MED ORDER — DOCUSATE SODIUM 100 MG PO CAPS
100.0000 mg | ORAL_CAPSULE | Freq: Two times a day (BID) | ORAL | Status: DC
  Administered 2020-04-22 – 2020-04-26 (×8): 100 mg via ORAL
  Filled 2020-04-22 (×8): qty 1

## 2020-04-22 MED ORDER — METOPROLOL SUCCINATE ER 50 MG PO TB24
150.0000 mg | ORAL_TABLET | Freq: Every day | ORAL | Status: DC
  Administered 2020-04-23 – 2020-04-26 (×4): 150 mg via ORAL
  Filled 2020-04-22 (×4): qty 3

## 2020-04-22 MED ORDER — AMLODIPINE BESYLATE 10 MG PO TABS
10.0000 mg | ORAL_TABLET | Freq: Every day | ORAL | Status: DC
  Administered 2020-04-23 – 2020-04-26 (×4): 10 mg via ORAL
  Filled 2020-04-22 (×4): qty 1

## 2020-04-22 MED ORDER — CEFAZOLIN SODIUM-DEXTROSE 2-4 GM/100ML-% IV SOLN
2.0000 g | Freq: Four times a day (QID) | INTRAVENOUS | Status: AC
  Administered 2020-04-22 – 2020-04-23 (×2): 2 g via INTRAVENOUS
  Filled 2020-04-22 (×2): qty 100

## 2020-04-22 MED ORDER — METOCLOPRAMIDE HCL 5 MG/ML IJ SOLN: 5.0000 mg | Freq: Three times a day (TID) | INTRAMUSCULAR | Status: DC | PRN

## 2020-04-22 MED ORDER — SODIUM CHLORIDE 0.9 % IV SOLN: INTRAVENOUS | Status: DC

## 2020-04-22 MED ORDER — METHOCARBAMOL 500 MG PO TABS
500.0000 mg | ORAL_TABLET | Freq: Four times a day (QID) | ORAL | Status: DC | PRN
  Administered 2020-04-24: 500 mg via ORAL
  Filled 2020-04-22: qty 1

## 2020-04-22 MED ORDER — OXYCODONE HCL 5 MG PO TABS
5.0000 mg | ORAL_TABLET | Freq: Once | ORAL | Status: AC | PRN
  Administered 2020-04-22: 5 mg via ORAL

## 2020-04-22 MED ORDER — OXYCODONE HCL 5 MG PO TABS
ORAL_TABLET | ORAL | Status: AC
  Filled 2020-04-22: qty 1

## 2020-04-22 MED ORDER — MAGNESIUM HYDROXIDE 400 MG/5ML PO SUSP
30.0000 mL | Freq: Every day | ORAL | Status: DC | PRN
  Administered 2020-04-25: 30 mL via ORAL
  Filled 2020-04-22: qty 30

## 2020-04-22 MED ORDER — BISACODYL 10 MG RE SUPP
10.0000 mg | Freq: Every day | RECTAL | Status: DC | PRN
  Filled 2020-04-22: qty 1

## 2020-04-22 MED ORDER — MENTHOL 3 MG MT LOZG
1.0000 | LOZENGE | OROMUCOSAL | Status: DC | PRN
  Filled 2020-04-22: qty 9

## 2020-04-22 MED ORDER — ALUM & MAG HYDROXIDE-SIMETH 200-200-20 MG/5ML PO SUSP: 30.0000 mL | ORAL | Status: DC | PRN

## 2020-04-22 MED ORDER — METHOCARBAMOL 1000 MG/10ML IJ SOLN
500.0000 mg | Freq: Four times a day (QID) | INTRAVENOUS | Status: DC | PRN
  Filled 2020-04-22: qty 5

## 2020-04-22 MED ORDER — BUPIVACAINE-EPINEPHRINE 0.25% -1:200000 IJ SOLN
INTRAMUSCULAR | Status: DC | PRN
  Administered 2020-04-22: 30 mL

## 2020-04-22 MED ORDER — MEPERIDINE HCL 25 MG/ML IJ SOLN
12.5000 mg | INTRAMUSCULAR | Status: DC | PRN
  Filled 2020-04-22: qty 1

## 2020-04-22 MED ORDER — TRAMADOL HCL 50 MG PO TABS
ORAL_TABLET | ORAL | Status: AC
  Filled 2020-04-22: qty 1

## 2020-04-22 MED ORDER — PROPOFOL 500 MG/50ML IV EMUL
INTRAVENOUS | Status: DC | PRN
  Administered 2020-04-22: 100 ug/kg/min via INTRAVENOUS

## 2020-04-22 MED ORDER — SODIUM CHLORIDE 0.9 % IV SOLN: INTRAVENOUS | Status: DC | PRN

## 2020-04-22 MED ORDER — MIDAZOLAM HCL 2 MG/2ML IJ SOLN
INTRAMUSCULAR | Status: AC
  Filled 2020-04-22: qty 2

## 2020-04-22 MED ORDER — NITROGLYCERIN 0.4 MG SL SUBL
0.4000 mg | SUBLINGUAL_TABLET | SUBLINGUAL | Status: DC | PRN
Start: 2020-04-22 — End: 2020-04-26

## 2020-04-22 MED ORDER — ONDANSETRON HCL 4 MG/2ML IJ SOLN: 4.0000 mg | Freq: Once | INTRAMUSCULAR | Status: DC | PRN

## 2020-04-22 MED ORDER — METOCLOPRAMIDE HCL 10 MG PO TABS
5.0000 mg | ORAL_TABLET | Freq: Three times a day (TID) | ORAL | Status: DC | PRN
Start: 2020-04-22 — End: 2020-04-26

## 2020-04-22 MED ORDER — ONDANSETRON HCL 4 MG PO TABS
4.0000 mg | ORAL_TABLET | Freq: Four times a day (QID) | ORAL | Status: DC | PRN
  Administered 2020-04-24: 4 mg via ORAL
  Filled 2020-04-22: qty 1

## 2020-04-22 MED ORDER — BUPIVACAINE HCL (PF) 0.5 % IJ SOLN
INTRAMUSCULAR | Status: DC | PRN
  Administered 2020-04-22: 3 mL

## 2020-04-22 MED ORDER — TRAMADOL HCL 50 MG PO TABS
50.0000 mg | ORAL_TABLET | Freq: Four times a day (QID) | ORAL | Status: DC
Start: 2020-04-22 — End: 2020-04-26
  Administered 2020-04-22 – 2020-04-26 (×14): 50 mg via ORAL
  Filled 2020-04-22 (×15): qty 1

## 2020-04-22 MED ORDER — MAGNESIUM CITRATE PO SOLN
1.0000 | Freq: Once | ORAL | Status: DC | PRN
  Filled 2020-04-22: qty 296

## 2020-04-22 SURGICAL SUPPLY — 61 items
BLADE SAGITTAL AGGR TOOTH XLG (BLADE) ×2 IMPLANT
BNDG COHESIVE 6X5 TAN STRL LF (GAUZE/BANDAGES/DRESSINGS) ×6 IMPLANT
CANISTER SUCT 1200ML W/VALVE (MISCELLANEOUS) ×2 IMPLANT
CANISTER WOUND CARE 500ML ATS (WOUND CARE) ×2 IMPLANT
CHLORAPREP W/TINT 26 (MISCELLANEOUS) ×2 IMPLANT
COVER BACK TABLE REUSABLE LG (DRAPES) ×2 IMPLANT
COVER WAND RF STERILE (DRAPES) ×2 IMPLANT
DRAPE 3/4 80X56 (DRAPES) ×6 IMPLANT
DRAPE C-ARM XRAY 36X54 (DRAPES) ×2 IMPLANT
DRAPE INCISE IOBAN 66X60 STRL (DRAPES) IMPLANT
DRAPE POUCH INSTRU U-SHP 10X18 (DRAPES) ×2 IMPLANT
DRESSING SURGICEL FIBRLLR 1X2 (HEMOSTASIS) ×2 IMPLANT
DRSG MEPILEX SACRM 8.7X9.8 (GAUZE/BANDAGES/DRESSINGS) ×2 IMPLANT
DRSG OPSITE POSTOP 4X8 (GAUZE/BANDAGES/DRESSINGS) ×4 IMPLANT
DRSG SURGICEL FIBRILLAR 1X2 (HEMOSTASIS) ×4
ELECT BLADE 6.5 EXT (BLADE) ×2 IMPLANT
ELECT REM PT RETURN 9FT ADLT (ELECTROSURGICAL) ×2
ELECTRODE REM PT RTRN 9FT ADLT (ELECTROSURGICAL) ×1 IMPLANT
GLOVE SURG SYN 9.0  PF PI (GLOVE) ×2
GLOVE SURG SYN 9.0 PF PI (GLOVE) ×2 IMPLANT
GLOVE SURG UNDER POLY LF SZ9 (GLOVE) ×2 IMPLANT
GOWN SRG 2XL LVL 4 RGLN SLV (GOWNS) ×1 IMPLANT
GOWN STRL NON-REIN 2XL LVL4 (GOWNS) ×1
GOWN STRL REUS W/ TWL LRG LVL3 (GOWN DISPOSABLE) ×1 IMPLANT
GOWN STRL REUS W/TWL LRG LVL3 (GOWN DISPOSABLE) ×1
HEMOVAC 400CC 10FR (MISCELLANEOUS) IMPLANT
HIP DBL LINER 54X28 (Liner) ×2 IMPLANT
HIP FEM HD M 28 (Head) ×2 IMPLANT
HOLDER FOLEY CATH W/STRAP (MISCELLANEOUS) ×2 IMPLANT
HOOD PEEL AWAY FLYTE STAYCOOL (MISCELLANEOUS) ×2 IMPLANT
IRRIGATION SURGIPHOR STRL (IV SOLUTION) IMPLANT
KIT PREVENA INCISION MGT 13 (CANNISTER) ×2 IMPLANT
MANIFOLD NEPTUNE II (INSTRUMENTS) ×2 IMPLANT
MAT ABSORB  FLUID 56X50 GRAY (MISCELLANEOUS) ×1
MAT ABSORB FLUID 56X50 GRAY (MISCELLANEOUS) ×1 IMPLANT
NDL SAFETY ECLIPSE 18X1.5 (NEEDLE) ×1 IMPLANT
NEEDLE HYPO 18GX1.5 SHARP (NEEDLE) ×2
NEEDLE SPNL 20GX3.5 QUINCKE YW (NEEDLE) ×4 IMPLANT
NS IRRIG 1000ML POUR BTL (IV SOLUTION) ×2 IMPLANT
PACK HIP COMPR (MISCELLANEOUS) ×2 IMPLANT
SCALPEL PROTECTED #10 DISP (BLADE) ×4 IMPLANT
SHELL ACETABULAR SZ 54 DM (Shell) ×2 IMPLANT
SOL PREP PVP 2OZ (MISCELLANEOUS) ×2
SOLUTION PREP PVP 2OZ (MISCELLANEOUS) ×1 IMPLANT
SPONGE DRAIN TRACH 4X4 STRL 2S (GAUZE/BANDAGES/DRESSINGS) ×2 IMPLANT
STAPLER SKIN PROX 35W (STAPLE) ×2 IMPLANT
STEM FEMORAL 4 STD COLLARED (Stem) ×2 IMPLANT
STRAP SAFETY 5IN WIDE (MISCELLANEOUS) ×2 IMPLANT
SUT DVC 2 QUILL PDO  T11 36X36 (SUTURE) ×2
SUT DVC 2 QUILL PDO T11 36X36 (SUTURE) ×1 IMPLANT
SUT SILK 0 (SUTURE) ×2
SUT SILK 0 30XBRD TIE 6 (SUTURE) ×1 IMPLANT
SUT V-LOC 90 ABS DVC 3-0 CL (SUTURE) ×2 IMPLANT
SUT VIC AB 1 CT1 36 (SUTURE) ×2 IMPLANT
SYR 20ML LL LF (SYRINGE) ×2 IMPLANT
SYR 30ML LL (SYRINGE) ×2 IMPLANT
SYR 50ML LL SCALE MARK (SYRINGE) ×4 IMPLANT
SYR BULB IRRIG 60ML STRL (SYRINGE) ×2 IMPLANT
TAPE MICROFOAM 4IN (TAPE) ×2 IMPLANT
TOWEL OR 17X26 4PK STRL BLUE (TOWEL DISPOSABLE) ×2 IMPLANT
TRAY FOLEY MTR SLVR 16FR STAT (SET/KITS/TRAYS/PACK) ×2 IMPLANT

## 2020-04-22 NOTE — Op Note (Signed)
04/22/2020  3:31 PM  PATIENT:  Casey French  70 y.o. 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  PROCEDURE:  Procedure(s): TOTAL HIP ARTHROPLASTY ANTERIOR APPROACH (Left)  SURGEON: Laurene Footman, MD  ASSISTANTS: None  ANESTHESIA:   spinal  EBL:  Total I/O In: 1000 [I.V.:1000] Out: 600 [Urine:450; Blood:150]  BLOOD ADMINISTERED:none  DRAINS: Incisional wound VAC   LOCAL MEDICATIONS USED:  MARCAINE    and OTHER Exparel  SPECIMEN:  Source of Specimen:  Left femoral head  DISPOSITION OF SPECIMEN:  PATHOLOGY  COUNTS:  YES  TOURNIQUET:  * No tourniquets in log *  IMPLANTS: Medacta AMIS 4 standard stem, 54 mm Mpact TM cup and liner with metal M 28 mm head  DICTATION: .Dragon Dictation   The patient was brought to the operating room and after spinal anesthesia was obtained patient was placed on the operative table with the ipsilateral foot into the Medacta attachment, contralateral leg on a well-padded table. C-arm was brought in and preop template x-ray taken. After prepping and draping in usual sterile fashion appropriate patient identification and timeout procedures were completed. Anterior approach to the hip was obtained and centered over the greater trochanter and TFL muscle. The subcutaneous tissue was incised hemostasis being achieved by electrocautery. TFL fascia was incised and the muscle retracted laterally deep retractor placed. The lateral femoral circumflex vessels were identified and ligated. The anterior capsule was exposed and a capsulotomy performed. The neck was identified and a femoral neck cut carried out with a saw. The head was removed without difficulty and showed sclerotic femoral head and acetabulum. Reaming was carried out to 54 mm and a 54 mm cup trial gave appropriate tightness to the acetabular component a 54 DM cup was impacted into  position. The leg was then externally rotated and ischiofemoral and pubofemoral releases carried out. The femur was sequentially broached to a size 4, size 4 standard with S and then M head trials were placed and the final components chosen. The 4 standard stem was inserted along with a metal M 28 mm head and 54 mm liner. The hip was reduced and was stable the wound was thoroughly irrigated with fibrillar placed along the posterior capsule and medial neck. The deep fascia ws closed using a heavy Quill after infiltration of 30 cc of quarter percent Sensorcaine with epinephrine diluted with Exparel throughout the case and .3-0 V-loc to close the skin with skin staples.  Incisional wound VAC applied and patient was sent to recovery in stable condition.   PLAN OF CARE: Admit to inpatient

## 2020-04-22 NOTE — Anesthesia Preprocedure Evaluation (Signed)
Anesthesia Evaluation  Patient identified by MRN, date of birth, ID band Patient awake    Reviewed: Allergy & Precautions, H&P , NPO status , Patient's Chart, lab work & pertinent test results  History of Anesthesia Complications Negative for: history of anesthetic complications  Airway Mallampati: II  TM Distance: >3 FB     Dental  (+) Edentulous Lower, Edentulous Upper   Pulmonary neg sleep apnea, neg COPD, former smoker,    breath sounds clear to auscultation       Cardiovascular hypertension, + angina (stable angina) with exertion + CAD, + Past MI and + Cardiac Stents  + dysrhythmias Atrial Fibrillation  Rate:Normal     Neuro/Psych PSYCHIATRIC DISORDERS Depression negative neurological ROS     GI/Hepatic Neg liver ROS, GERD  ,  Endo/Other  diabetes  Renal/GU Renal disease (CKD)     Musculoskeletal   Abdominal   Peds  Hematology  (+) Blood dyscrasia, anemia , Hgb 8.3   Anesthesia Other Findings Past Medical History: No date: Anemia No date: Anginal pain ([place]) No date: Aortic atherosclerosis (HCC) No date: Atrial fibrillation (HCC) No date: CKD (chronic kidney disease) stage 3, GFR 30-59 ml/min (HCC) No date: Coronary artery disease No date: Depression No date: Diabetes mellitus without complication (HCC) No date: Diverticulosis No date: GERD (gastroesophageal reflux disease) No date: Gout No date: Heart murmur No date: High cholesterol No date: Hypertension 2003: STEMI (ST elevation myocardial infarction) ([place])     Comment:  Inferior  Past Surgical History: No date: CARDIAC CATHETERIZATION 07/05/2019: CATARACT EXTRACTION W/PHACO; Right     Comment:  Procedure: CATARACT EXTRACTION PHACO AND INTRAOCULAR               LENS PLACEMENT ([place]) RIGHT DIABETIC;  Surgeon: Birder Robson, MD;  Location: [facility] ORS;  Service:               Ophthalmology;  Laterality: Right;                 US00:59.3 CDE8.41 LOT [id number] 04/01/2017: COLONOSCOPY WITH PROPOFOL; N/A     Comment:  Procedure: COLONOSCOPY WITH PROPOFOL;  Surgeon:               Lollie Sails, MD;  Location: [facility] ENDOSCOPY;                Service: Endoscopy;  Laterality: N/A; 10/2001: CORONARY ANGIOPLASTY WITH STENT PLACEMENT     Comment:  RCA 07/2015: CORONARY ANGIOPLASTY WITH STENT PLACEMENT     Comment:  LAD 2015: right hand surgery     Comment:  3 screws in right hand     Reproductive/Obstetrics negative OB ROS                             Anesthesia Physical Anesthesia Plan  ASA: III  Anesthesia Plan: Spinal   Post-op Pain Management:    Induction:   PONV Risk Score and Plan: Propofol infusion and Ondansetron  Airway Management Planned: Simple Face Mask  Additional Equipment:   Intra-op Plan:   Post-operative Plan:   Informed Consent: I have reviewed the patients History and Physical, chart, labs and discussed the procedure including the risks, benefits and alternatives for the proposed anesthesia with the patient or authorized representative who has indicated his/her understanding and acceptance.     Dental Advisory Given  Plan Discussed with: Anesthesiologist,  CRNA and Surgeon  Anesthesia Plan Comments:         Anesthesia Quick Evaluation

## 2020-04-22 NOTE — Transfer of Care (Signed)
Immediate Anesthesia Transfer of Care Note  Patient: Casey French  Procedure(s) Performed: TOTAL HIP ARTHROPLASTY ANTERIOR APPROACH (Left Hip)  Patient Location: PACU  Anesthesia Type:Spinal  Level of Consciousness: sedated  Airway & Oxygen Therapy: Patient Spontanous Breathing and Patient connected to face mask oxygen  Post-op Assessment: Report given to RN and Post -op Vital signs reviewed and stable  Post vital signs: Reviewed and stable  Last Vitals:  Vitals Value Taken Time  BP 106/69 04/22/20 1535  Temp 36.3 C 04/22/20 1535  Pulse 71 04/22/20 1541  Resp 23 04/22/20 1541  SpO2 96 % 04/22/20 1541  Vitals shown include unvalidated device data.  Last Pain:  Vitals:   04/22/20 1118  TempSrc: Temporal  PainSc: 3          Complications: No complications documented.

## 2020-04-22 NOTE — Anesthesia Procedure Notes (Signed)
Spinal  Patient location during procedure: OR Start time: 04/22/2020 2:00 PM End time: 04/22/2020 2:07 PM Reason for block: surgical anesthesia Staffing Performed: other anesthesia staff  Other anesthesia staff: Martie Round, RN Preanesthetic Checklist Completed: patient identified, IV checked, site marked, risks and benefits discussed, surgical consent, monitors and equipment checked, pre-op evaluation and timeout performed Spinal Block Patient position: sitting Prep: DuraPrep Patient monitoring: heart rate, cardiac monitor, continuous pulse ox and blood pressure Approach: midline Location: L3-4 Injection technique: single-shot Needle Needle type: Sprotte  Needle gauge: 24 G Needle length: 9 cm Assessment Sensory level: T4 Events: CSF return

## 2020-04-22 NOTE — H&P (Signed)
Back to top of Progress Notes  Feliberto Gottron, [place] - 04/10/2020 3:15 PM EDT Formatting of this note is different from the original. Images from the original note were not included. Chief Complaint: Chief Complaint  Patient presents with  . Pre-op Exam  H&P Left THA 04/22/20 Casey French is a 70 y.o. male who presents today for history and physical for left total hip arthroplasty with Dr. Hessie Knows on 04/22/2020. He has had left hip pain for several years that has increased over the last year significantly. Patient enjoys doing yard work, [employer] but recently has had significant limitations due to his hip pain. Some days he cannot complete his yard work due to pain in his groin, buttocks, lateral hip and thigh. At night after being on his feet during the day his hip will ache and throb along the groin. Patient denies any numbness tingling radicular symptoms. No back pain. No recent trauma or injury. He is diabetic, last A1c 6.2. No history of blood clots. He is ambulatory no assistive devices. States he will take an occasional Excedrin. Pain is currently mild. He does admit to having a hard time going up steps, picking up things off the floor as well as getting in and out of a low vehicle and putting on his socks and shoes.  Past Medical History: Past Medical History:  Diagnosis Date  . Anemia  . Atrial fibrillation (CMS-HCC)  . CAD (coronary artery disease)  . Diabetes mellitus type 2, uncomplicated (CMS-HCC)  . Diverticulitis  . HTN (hypertension)  . Hyperlipidemia  . Myocardial infarction (CMS-HCC) 2003  with a stent and RCA in 2003   Past Surgical History: Past Surgical History:  Procedure Laterality Date  . COLONOSCOPY 02/2006  . COLONOSCOPY 12/14/2012  3 yrs  . COLONOSCOPY 04/01/2017  Hyperplastic colon polyp/Repeat 26yr/MUS  . pneumovax 07/2011  . Right hand surgery  with some disability since that time   Past Family History: Family History   Problem Relation Age of Onset  . Stroke Mother  . Other Father  CVA  . Seizures Father  . Lung cancer Brother  . High blood pressure (Hypertension) Brother   Medications: Current Outpatient Medications Ordered in Epic  Medication Sig Dispense Refill  . allopurinoL (ZYLOPRIM) 100 MG tablet Take 1 tablet (100 mg total) by mouth once daily 30 tablet 11  . apixaban (ELIQUIS) 2.5 mg tablet Take 1 tablet (2.5 mg total) by mouth every 12 (twelve) hours 60 tablet 11  . aspirin 81 MG EC tablet Take 81 mg by mouth once daily.  .Marland Kitchenaspirin-acetaminophen-caffeine (EXCEDRIN MIGRAINE) 250-250-65 mg per tablet Take by mouth.  . fluticasone (FLONASE) 50 mcg/actuation nasal spray SHAKE WELL AND USE 2 SPRAYS IN EACH NOSTRIL EVERY DAY 16 g 5  . gabapentin (NEURONTIN) 300 MG capsule TAKE 1 CAPSULE(300 MG) BY MOUTH EVERY NIGHT 30 capsule 0  . hydrocortisone 2.5 % cream Apply topically 2 (two) times daily. Apply after BM 20 g 1  . isosorbide mononitrate (IMDUR) 60 MG ER tablet TAKE 1 TABLET BY MOUTH EVERY DAY 90 tablet 3  . lisinopriL (ZESTRIL) 20 MG tablet Take 1 tablet (20 mg total) by mouth once daily 90 tablet 3  . metoprolol succinate (TOPROL-XL) 100 MG XL tablet Take 1.5 tablets (150 mg total) by mouth once daily 45 tablet 11  . nitroGLYcerin (NITROSTAT) 0.4 MG SL tablet Place 1 tablet (0.4 mg total) under the tongue every 5 (five) minutes as needed for Chest  pain May take up to 3 doses. 25 tablet 4  . omeprazole (PRILOSEC) 20 MG DR capsule Take 1 capsule (20 mg total) by mouth once daily 90 capsule 3  . oxyCODONE-acetaminophen (PERCOCET) 5-325 mg tablet Take 1 tablet by mouth every 6 (six) hours as needed.  . psyllium husk, with sugar, (METAMUCIL) 3.4 gram/12 gram oral powder Take 3.4 g by mouth 2 (two) times daily Mix with a full glass (240 mL) of fluid.  . TORsemide (DEMADEX) 10 MG tablet Take 1 tablet (10 mg total) by mouth once daily 30 tablet 11  . amLODIPine (NORVASC) 5 MG tablet Take 5 mg by mouth  once daily  . cloNIDine HCL (CATAPRES) 0.1 MG tablet Take by mouth 2 (two) times daily  . rosuvastatin (CRESTOR) 40 MG tablet Take 1 tablet (40 mg total) by mouth once daily 90 tablet 3   No current Epic-ordered facility-administered medications on file.   Allergies: Allergies  Allergen Reactions  . Morphine Anxiety    Review of Systems:  A comprehensive 14 point ROS was performed, reviewed by me today, and the pertinent orthopaedic findings are documented in the HPI.  Exam: BP 120/80  Ht 175.3 cm ('5\' 9"'$ )  Wt 65.3 kg (143 lb 15.4 oz)  BMI 21.26 kg/m  General:  Well developed, well nourished, no apparent distress, normal affect,   HEENT: Head normocephalic, atraumatic, PERRL.   Abdomen: Soft, non tender, non distended, Bowel sounds present.  Heart: Examination of the heart reveals regular, rate, and rhythm. There is no murmur noted on ascultation. There is a normal apical pulse.  Lungs: Lungs are clear to auscultation. There is no wheeze, rhonchi, or crackles. There is normal expansion of bilateral chest walls.   Left lower extremity: Examination of the left hip shows patient has good hip range of motion with mild stiffness with internal rotation. Extremes of internal rotation reproduces groin, thigh and buttocks pain. He is nontender along the trochanteric bursa. Nontender along the lumbar spine, spinous process or SI joints or sacral region. No iliac crest discomfort with palpation. Neurovascular tact in left lower extremity with no swelling or edema.  AP pelvis and lateral view of the left hip reviewed by me today: Patient has sclerotic changes with subchondral cyst formation along the left femoral head consistent with avascular necrosis. Joint space narrowing noted in the superior and central joint space. Moderate sclerotic changes along the superior acetabulum with mild superior acetabular spurring. No evidence of acute bony abnormality or collapse. Pelvic rings are  intact.  EXAM:  CT ABDOMEN AND PELVIS WITHOUT CONTRAST   TECHNIQUE:  Multidetector CT imaging of the abdomen and pelvis was performed  following the standard protocol without IV contrast.   COMPARISON: 05/15/2019   FINDINGS:  Lower chest: No acute findings.   Hepatobiliary: No mass visualized on this unenhanced exam.  Gallbladder is unremarkable. No evidence of biliary ductal  dilatation.   Pancreas: No mass or inflammatory process visualized on this  unenhanced exam.   Spleen: Within normal limits in size.   Adrenals/Urinary tract: No evidence of urolithiasis or  hydronephrosis. Stable small subcapsular cyst is seen in the lateral  lower pole of the right kidney. Unremarkable unopacified urinary  bladder.   Stomach/Bowel: No evidence of obstruction, inflammatory process, or  abnormal fluid collections. Normal appendix visualized. Severe  sigmoid diverticulosis and associated muscular hypertrophy is again  demonstrated, however there is no evidence of acute diverticulitis.   Vascular/Lymphatic: No pathologically enlarged lymph nodes  identified. No  evidence of abdominal aortic aneurysm. Aortic  atherosclerotic calcification noted.   Reproductive: No mass or other significant abnormality.   Other: None.   Musculoskeletal: No suspicious bone lesions identified. Chronic  avascular necrosis is again seen involving the left femoral head.   IMPRESSION:  No evidence of urolithiasis, hydronephrosis, or other acute  findings.   Severe sigmoid diverticulosis, without radiographic evidence of  acute diverticulitis.   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   Aortic Atherosclerosis (ICD10-I70.0).   Impression: Avascular necrosis of bone of hip, left (CMS-HCC) [M87.052] Avascular necrosis of bone of hip, left (CMS-HCC) (primary encounter diagnosis)  Plan:  21. 70 year old male with severe debilitating left hip pain. X-rays and CT scan show AVN. Pain is  interfering with his quality of life and activities daily living. Risks, benefits, complications of a left total hip arthroplasty have been discussed with the patient. Patient has agreed and consented procedure with Dr. Hessie Knows on 04/22/2020.  This note was generated in part with voice recognition software and I apologize for any typographical errors that were not detected and corrected.  Feliberto Gottron MPA-C    Electronically signed by Feliberto Gottron, PA at 04/11/2020 8:35 AM EDT    Reviewed  H+P. No changes noted.

## 2020-04-23 LAB — BASIC METABOLIC PANEL
Anion gap: 6 (ref 5–15)
BUN: 30 mg/dL — ABNORMAL HIGH (ref 8–23)
CO2: 19 mmol/L — ABNORMAL LOW (ref 22–32)
Calcium: 7.6 mg/dL — ABNORMAL LOW (ref 8.9–10.3)
Chloride: 113 mmol/L — ABNORMAL HIGH (ref 98–111)
Creatinine, Ser: 2.92 mg/dL — ABNORMAL HIGH (ref 0.61–1.24)
GFR, Estimated: 23 mL/min — ABNORMAL LOW (ref >60)
Glucose, Bld: 119 mg/dL — ABNORMAL HIGH (ref 70–99)
Potassium: 4.4 mmol/L (ref 3.5–5.1)
Sodium: 138 mmol/L (ref 135–145)

## 2020-04-23 LAB — CBC
HCT: 21.1 % — ABNORMAL LOW (ref 39.0–52.0)
HCT: 19.7 % — ABNORMAL LOW (ref 39.0–52.0)
Hemoglobin: 7 g/dL — ABNORMAL LOW (ref 13.0–17.0)
Hemoglobin: 6.3 g/dL — ABNORMAL LOW (ref 13.0–17.0)
MCH: 31.4 pg (ref 26.0–34.0)
MCH: 31.3 pg (ref 26.0–34.0)
MCHC: 33.2 g/dL (ref 30.0–36.0)
MCHC: 32 g/dL (ref 30.0–36.0)
MCV: 94.6 fL (ref 80.0–100.0)
MCV: 98 fL (ref 80.0–100.0)
Platelets: 192 10*3/uL (ref 150–400)
Platelets: 186 10*3/uL (ref 150–400)
RBC: 2.23 MIL/uL — ABNORMAL LOW (ref 4.22–5.81)
RBC: 2.01 MIL/uL — ABNORMAL LOW (ref 4.22–5.81)
RDW: 14.6 % (ref 11.5–15.5)
RDW: 14.4 % (ref 11.5–15.5)
WBC: 6.5 10*3/uL (ref 4.0–10.5)
WBC: 9 10*3/uL (ref 4.0–10.5)
nRBC: 0 % (ref 0.0–0.2)
nRBC: 0 % (ref 0.0–0.2)

## 2020-04-23 LAB — PREPARE RBC (CROSSMATCH)

## 2020-04-23 MED ORDER — CHLORHEXIDINE GLUCONATE CLOTH 2 % EX PADS
6.0000 | MEDICATED_PAD | Freq: Every day | CUTANEOUS | Status: DC
  Administered 2020-04-24 – 2020-04-26 (×2): 6 via TOPICAL

## 2020-04-23 MED ORDER — SODIUM CHLORIDE 0.9% IV SOLUTION: Freq: Once | INTRAVENOUS | Status: AC

## 2020-04-23 MED ORDER — FE FUMARATE-B12-VIT C-FA-IFC PO CAPS
1.0000 | ORAL_CAPSULE | Freq: Two times a day (BID) | ORAL | Status: DC
  Administered 2020-04-23 – 2020-04-26 (×7): 1 via ORAL
  Filled 2020-04-23 (×10): qty 1

## 2020-04-23 NOTE — Progress Notes (Signed)
Physical Therapy Treatment Patient Details Name: Casey French MRN: [id number] DOB: Jun 06, 1950 Today's Date: 04/23/2020    History of Present Illness Pt admitted for L THR. History of CKD, depression, DM, GERD, gout, and HTN.    PT Comments    Pt is making good progress towards goals with ability to ambulate around RN station using RW. Reciprocal gait pattern performed with decreased speed. Good endurance with there-ex, with written HEP given and reviewed. Needs to perform stair training tomorrow prior to discharge.   Follow Up Recommendations  Home health PT     Equipment Recommendations  Rolling walker with 5" wheels;3in1 (PT)    Recommendations for Other Services       Precautions / Restrictions Precautions Precautions: Fall;Anterior Hip Precaution Booklet Issued: Yes (comment) Restrictions Weight Bearing Restrictions: Yes LLE Weight Bearing: Weight bearing as tolerated    Mobility  Bed Mobility Overal bed mobility: Needs Assistance Bed Mobility: Sit to Supine       Sit to supine: Min guard   General bed mobility comments: needed guidance for bringing surgical extremity back onto bed.    Transfers Overall transfer level: Needs assistance Equipment used: Rolling walker (2 wheeled) Transfers: Sit to/from Stand Sit to Stand: Min guard         General transfer comment: decreased cues for hand placement. Upright posture.  Ambulation/Gait Ambulation/Gait assistance: Min guard Gait Distance (Feet): 200 Feet Assistive device: Rolling walker (2 wheeled) Gait Pattern/deviations: Step-through pattern Gait velocity: 10' in 11 seconds   General Gait Details: ambulation performed in hallway with reciprocal gait pattern. Slow speed with cues for posture and looking forward. Fatigues with distance   Stairs             Wheelchair Mobility    Modified Rankin (Stroke Patients Only)       Balance Overall balance assessment: Needs  assistance Sitting-balance support: No upper extremity supported;Feet supported Sitting balance-Leahy Scale: Good     Standing balance support: Bilateral upper extremity supported Standing balance-Leahy Scale: Good Standing balance comment: Able to perform bimanual grooming tasks with UE unsupported. no LOB observed                            Cognition Arousal/Alertness: Awake/alert Behavior During Therapy: WFL for tasks assessed/performed Overall Cognitive Status: Within Functional Limits for tasks assessed                                 General Comments: A&Ox4. Pleasant and agreeable throughout      Exercises Other Exercises Other Exercises: Pt performed supine ther-ex on L LE including AP, quad sets, glut sets, SAQ, and hip add squeezes, and hip abd/add x 10 reps on L LE min assist required    General Comments        Pertinent Vitals/Pain Pain Assessment: 0-10 Pain Score: 4  Pain Location: L hip Pain Descriptors / Indicators: Aching;Operative site guarding Pain Intervention(s): Limited activity within patient's tolerance    Home Living Family/patient expects to be discharged to:: Private residence Living Arrangements: Spouse/significant other Available Help at Discharge: Family (multiple family members around) Type of Home: House Home Access: Stairs to enter Entrance Stairs-Rails: Can reach both Home Layout: One level Home Equipment: Cane - single point      Prior Function Level of Independence: Independent with assistive device(s)      Comments: Pt has been using  SPC occasionally. Endorses 2 falls in past 6 months. Pt is indep with all ADLs, drives, and works in the yard   PT Goals (current goals can now be found in the care plan section) Acute Rehab PT Goals Patient Stated Goal: to go home PT Goal Formulation: With patient Time For Goal Achievement: 05/07/20 Potential to Achieve Goals: Good Additional Goals Additional Goal #1: Pt  will be able to perform bed mobility/transfers with mod I and safe technique. Progress towards PT goals: Progressing toward goals    Frequency    BID      PT Plan Current plan remains appropriate    Co-evaluation              AM-PAC PT "6 Clicks" Mobility   Outcome Measure  Help needed turning from your back to your side while in a flat bed without using bedrails?: None Help needed moving from lying on your back to sitting on the side of a flat bed without using bedrails?: A Little Help needed moving to and from a bed to a chair (including a wheelchair)?: A Little Help needed standing up from a chair using your arms (e.g., wheelchair or bedside chair)?: A Little Help needed to walk in hospital room?: A Little Help needed climbing 3-5 steps with a railing? : A Lot 6 Click Score: 18    End of Session Equipment Utilized During Treatment: Gait belt Activity Tolerance: Patient tolerated treatment well Patient left: in bed;with bed alarm set;with SCD's reapplied Nurse Communication: Mobility status PT Visit Diagnosis: Muscle weakness (generalized) (M62.81);Difficulty in walking, not elsewhere classified (R26.2);Pain Pain - Right/Left: Left Pain - part of body: Hip     Time: [id number] PT Time Calculation (min) (ACUTE ONLY): 29 min  Charges:  $Gait Training: 8-22 mins $Therapeutic Exercise: 8-22 mins                     Greggory Stallion, PT, DPT [phone]    Daryle Boyington 04/23/2020, 4:06 PM

## 2020-04-23 NOTE — Evaluation (Signed)
Occupational Therapy Evaluation Patient Details Name: Casey French MRN: [id number] DOB: May 07, 1950 Today's Date: 04/23/2020    History of Present Illness Pt admitted for L THR. History of CKD, depression, DM, GERD, gout, and HTN.   Clinical Impression   Pt seen for OT evaluation this date, POD#1 from above surgery. Pt was independent in all ADLs prior to surgery, however occasionally using SPC for mobility due to L hip pain. Prior to admission, pt was living with wife in a one-story home, driving, and engaging in yard work. Pt endorses 2 falls within the past 6 months. Pt is eager to return to PLOF with less pain and improved safety and independence. Pt currently requires MOD assist for LB dressing while in seated position due to pain and limited AROM of L hip. Pt requires MIN GUARD for functional mobility of short household distances with RW and MIN GUARD for standing grooming tasks. Pt would benefit from additional skilled OT services to maximize return to PLOF and minimize risk of future falls, injury, caregiver burden, and readmission. Upon discharge, recommend HHOT and supervision/assistance from family PRN.     Follow Up Recommendations  Home health OT;Supervision - Intermittent    Equipment Recommendations  3 in 1 bedside commode       Precautions / Restrictions Precautions Precautions: Fall;Anterior Hip Precaution Booklet Issued: No Restrictions Weight Bearing Restrictions: Yes LLE Weight Bearing: Weight bearing as tolerated      Mobility Bed Mobility     General bed mobility comments: not assessed as pt was in recliner upon arrival    Transfers Overall transfer level: Needs assistance Equipment used: Rolling walker (2 wheeled) Transfers: Sit to/from Stand Sit to Stand: Min guard         General transfer comment: upright posture noted with cues for pushing from seated surface. RW used    Balance Overall balance assessment: Needs assistance Sitting-balance  support: No upper extremity supported;Feet supported Sitting balance-Leahy Scale: Good     Standing balance support: No upper extremity supported;During functional activity Standing balance-Leahy Scale: Fair Standing balance comment: Able to perform bimanual grooming tasks with UE unsupported. no LOB observed                           ADL either performed or assessed with clinical judgement   ADL Overall ADL's : Needs assistance/impaired     Grooming: Wash/dry hands;Brushing hair;Min guard;Standing               Lower Body Dressing: Moderate assistance;Sitting/lateral leans Lower Body Dressing Details (indicate cue type and reason): to don socks. Pt able to don/doff R sock independent. Assist required for L sock             Functional mobility during ADLs: Min guard;Rolling walker (short household distances)                    Pertinent Vitals/Pain Pain Assessment: 0-10 Pain Score: 6  Pain Location: L hip Pain Descriptors / Indicators: Aching;Operative site guarding Pain Intervention(s): Limited activity within patient's tolerance;Premedicated before session;Monitored during session        Extremity/Trunk Assessment Upper Extremity Assessment Upper Extremity Assessment: Overall WFL for tasks assessed   Lower Extremity Assessment Lower Extremity Assessment: Defer to PT evaluation       Communication Communication Communication: No difficulties   Cognition Arousal/Alertness: Awake/alert Behavior During Therapy: WFL for tasks assessed/performed Overall Cognitive Status: Within Functional Limits for tasks assessed  General Comments: A&Ox4. Pleasant and agreeable throughout              [place] expects to be discharged to:: Private residence Living Arrangements: Spouse/significant other Available Help at Discharge: Family (multiple family members around) Type of Home:  House Home Access: Stairs to enter [employer] of Steps: 5-6 in front Entrance Stairs-Rails: Can reach both Home Layout: One level     Bathroom Shower/Tub: Tub/shower unit         Home Equipment: Cane - single point          Prior Functioning/Environment Level of Independence: Independent with assistive device(s)        Comments: Pt has been using SPC occasionally. Endorses 2 falls in past 6 months. Pt is indep with all ADLs, drives, and works in the yard        OT Problem List: Decreased strength;Decreased activity tolerance;Decreased range of motion;Impaired balance (sitting and/or standing);Pain      OT Treatment/Interventions: Self-care/ADL training;Therapeutic exercise;DME and/or AE instruction;Therapeutic activities;Patient/family education;Balance training    OT Goals(Current goals can be found in the care plan section) Acute Rehab OT Goals Patient Stated Goal: to go home OT Goal Formulation: With patient Time For Goal Achievement: 05/07/20 Potential to Achieve Goals: Good ADL Goals Pt Will Perform Grooming: with modified independence;standing Pt Will Transfer to Toilet: with modified independence;ambulating;regular height toilet Pt Will Perform Toileting - Clothing Manipulation and hygiene: with modified independence;sitting/lateral leans  OT Frequency: Min 1X/week    AM-PAC OT "6 Clicks" Daily Activity     Outcome Measure Help from another person eating meals?: None Help from another person taking care of personal grooming?: A Little Help from another person toileting, which includes using toliet, bedpan, or urinal?: A Little Help from another person bathing (including washing, rinsing, drying)?: A Lot Help from another person to put on and taking off regular upper body clothing?: A Little Help from another person to put on and taking off regular lower body clothing?: A Lot 6 Click Score: 17   End of Session Equipment Utilized During Treatment:  Gait belt;Rolling walker Nurse Communication: Mobility status  Activity Tolerance: Patient tolerated treatment well Patient left: in chair;with call bell/phone within reach;with chair alarm set  OT Visit Diagnosis: Unsteadiness on feet (R26.81);History of falling (Z91.81)                Time: [id number] OT Time Calculation (min): 25 min Charges:  OT General Charges $OT Visit: 1 Visit OT Evaluation $OT Eval Moderate Complexity: 1 Mod OT Treatments $Self Care/Home Management : 8-22 mins  Fredirick Maudlin, OTR/L [place]

## 2020-04-23 NOTE — Anesthesia Postprocedure Evaluation (Signed)
Anesthesia Post Note  Patient: Casey French  Procedure(s) Performed: TOTAL HIP ARTHROPLASTY ANTERIOR APPROACH (Left Hip)  Patient location during evaluation: Nursing Unit Anesthesia Type: Spinal Level of consciousness: oriented and awake and alert Pain management: pain level controlled Vital Signs Assessment: post-procedure vital signs reviewed and stable Respiratory status: spontaneous breathing and respiratory function stable Cardiovascular status: blood pressure returned to baseline and stable Postop Assessment: no headache, no backache, no apparent nausea or vomiting and patient able to bend at knees Anesthetic complications: no   No complications documented.   Last Vitals:  Vitals:   04/23/20 0410 04/23/20 0756  BP: (!) 151/79 (!) 147/90  Pulse: 94 98  Resp: 16 16  Temp: 37.2 C 36.9 C  SpO2: 94% 93%    Last Pain:  Vitals:   04/23/20 0756  TempSrc: Oral  PainSc:                  Alison Stalling

## 2020-04-23 NOTE — Progress Notes (Signed)
Initial Nutrition Assessment  DOCUMENTATION CODES:   Not applicable  INTERVENTION:   -Nepro Shake po BID, each supplement provides 425 kcal and 19 grams protein -MVI with minerals daily  NUTRITION DIAGNOSIS:   Increased nutrient needs related to post-op healing as evidenced by estimated needs.  GOAL:   Patient will meet greater than or equal to 90% of their needs  MONITOR:   PO intake,Supplement acceptance,Labs,Weight trends,Skin,I & O's  REASON FOR ASSESSMENT:   Malnutrition Screening Tool    ASSESSMENT:   Casey French is a 70 y.o. male who presents today for history and physical for left total hip arthroplasty with Dr. Hessie Knows on 04/22/2020. He has had left hip pain for several years that has increased over the last year significantly. Patient enjoys doing yard work, [employer] but recently has had significant limitations due to his hip pain. Some days he cannot complete his yard work due to pain in his groin, buttocks, lateral hip and thigh. At night after being on his feet during the day his hip will ache and throb along the groin. Patient denies any numbness tingling radicular symptoms. No back pain. No recent trauma or injury. He is diabetic, last A1c 6.2. No history of blood clots. He is ambulatory no assistive devices. States he will take an occasional Excedrin. Pain is currently mild. He does admit to having a hard time going up steps, picking up things off the floor as well as getting in and out of a low vehicle and putting on his socks and shoes.  Pt admitted with lt hip pain.   3/29- s/p Procedure(s): TOTAL HIP ARTHROPLASTY ANTERIOR APPROACH (Left)  Reviewed I/O's: +130 ml x24 hours  UOP: 1.7 L x 24 hours  Spoke with pt, who reports decreased appetite today. Noted meal completion 30-50%. PTA pt reports good appetite, usually consuming 3 meals per day (Breakfast: eggs, toast, grits, and potatoes; Lunch: sandwich; Dinner: meat, starch, and vegetable).    Reviewed wt hx; pt has experienced a 7.1% wt loss over the past 3 months. Pt shares that he lost about 14 pounds over the past few weeks due to contracting norovirus about 2 weeks PTA (was unable to keep food or liquids down for 1 week).   Discussed importance of good meal and supplement intake to promote healing. Pt shares that he was consuming Boost supplements in the past, but stopped due to altered kidney function. He is interested in trying Nepro.   Medications reviewed and include 0.9% sodium chloride infusion @ 100 ml/hr.   Labs reviewed: CBGS: 103.  NUTRITION - FOCUSED PHYSICAL EXAM:  Flowsheet Row Most Recent Value  Orbital Region No depletion  Upper Arm Region No depletion  Thoracic and Lumbar Region No depletion  Buccal Region No depletion  Temple Region No depletion  Clavicle Bone Region No depletion  Clavicle and Acromion Bone Region No depletion  Scapular Bone Region No depletion  Dorsal Hand No depletion  Patellar Region No depletion  Anterior Thigh Region No depletion  Posterior Calf Region No depletion  Edema (RD Assessment) None  Hair Reviewed  Eyes Reviewed  Mouth Reviewed  Skin Reviewed  Nails Reviewed       Diet Order:   Diet Order            Diet regular Room service appropriate? Yes; Fluid consistency: Thin  Diet effective now                 EDUCATION NEEDS:   Education  needs have been addressed  Skin:  Skin Assessment: Skin Integrity Issues: Skin Integrity Issues:: Incisions,Wound VAC Wound Vac: lt leg Incisions: lt leg  Last BM:  04/21/20  Height:   Ht Readings from Last 1 Encounters:  04/22/20 '5\' 9"'$  (1.753 m)    Weight:   Wt Readings from Last 1 Encounters:  04/22/20 65.3 kg    Ideal Body Weight:  72.7 kg  BMI:  Body mass index is 21.26 kg/m.  Estimated Nutritional Needs:   Kcal:  2100-2300  Protein:  115-130 grams  Fluid:  > 2 L    Loistine Chance, RD, LDN, [place] Registered Dietitian II Certified Diabetes Care  and Education Specialist Please refer to [facility] for RD and/or RD on-call/weekend/after hours pager

## 2020-04-23 NOTE — TOC Initial Note (Signed)
Transition of Care [facility]) - Initial/Assessment Note    Patient Details  Name: Casey French MRN: [id number] Date of Birth: 07/18/1950  [facility] CM/SW Contact:    Pete Pelt, RN Phone Number: 04/23/2020, 3:23 PM  Clinical Narrative:   TOC in to see patient and spouse at bedside.  Purpose Discharge planning.  Patient lives at home with home health.  Patient lives at home and has no concerns about getting to appointments or getting prescriptions.  No further concerns about being home.  [facility] pre arranged by MD office.  Patient and spouse aware and amenable.  DME arranged with [employer].  No further questions, TOC contact information given, TOC to follow through discharge.                Expected Discharge Plan: [place] Barriers to Discharge: Continued Medical Work up   Patient Goals and CMS Choice     Choice offered to / list presented to : NA  Expected Discharge Plan and Services Expected Discharge Plan: [place] In-house Referral: NA   Post Acute Care Choice: [place] arrangements for the past 2 months: Single Family Home                 DME Arranged: 3-N-1,Walker rolling DME Agency: [employer] Date DME Agency Contacted: 04/23/20 Time DME Agency Contacted: [id number] Representative spoke with at DME Agency: [place]:  (Pre arranged with [facility] by MD office) [place] Agency: [facility] ([facility]        Prior Living Arrangements/Services Living arrangements for the past 2 months: [place] with:: Self,Spouse Patient language and need for interpreter reviewed:: Yes Do you feel safe going back to the place where you live?: Yes      Need for Family Participation in Patient Care: Yes (Comment) Care giver support system in place?: Yes (comment)   Criminal Activity/Legal Involvement Pertinent to Current Situation/Hospitalization: No - Comment as  needed  Activities of Daily Living Home Assistive Devices/Equipment: None ADL Screening (condition at time of admission) Patient's cognitive ability adequate to safely complete daily activities?: No Is the patient deaf or have difficulty hearing?: No Does the patient have difficulty seeing, even when wearing glasses/contacts?: No Does the patient have difficulty concentrating, remembering, or making decisions?: No Patient able to express need for assistance with ADLs?: Yes Does the patient have difficulty dressing or bathing?: No Independently performs ADLs?: Yes (appropriate for developmental age) Does the patient have difficulty walking or climbing stairs?: No Weakness of Legs: None Weakness of Arms/Hands: None  Permission Sought/Granted   Permission granted to share information with : Yes, Verbal Permission Granted     Permission granted to share info w AGENCY: [facility]        Emotional Assessment Appearance:: Appears stated age Attitude/Demeanor/Rapport: Gracious,Engaged Affect (typically observed): Pleasant,Appropriate Orientation: : Oriented to Self,Oriented to Place,Oriented to  Time,Oriented to Situation   Psych Involvement: No (comment)  Admission diagnosis:  S/P hip replacement [Z96.649] Patient Active Problem List   Diagnosis Date Noted  . S/P hip replacement 04/22/2020  . Hematemesis 05/15/2019  . Hematochezia 05/15/2019  . Chronic anticoagulation 05/15/2019  . Atrial fibrillation, chronic ([place]) 05/15/2019  . GI bleed 05/15/2019  . Chronic kidney disease 05/15/2019  . Unstable angina ([place]) 11/10/2016  . Angina at rest [facility]) 05/01/2015  . Hypertensive urgency 06/14/2014  . Acute diverticulitis 06/14/2014  . AKI (acute kidney injury) (  [place]) 06/14/2014  . Benign essential HTN 06/14/2014  . Hyperlipidemia 06/14/2014  . DM type 2 (diabetes mellitus, type 2) ([place]) 06/14/2014  . CAD (coronary artery disease) 06/14/2014  . GERD without esophagitis 06/14/2014  .  Closed head injury 03/24/2013   PCP:  [name], MD Pharmacy:   [facility] [phone] - [address], [address] AT [address] [place] [address] Phone: [phone] Fax: [phone]  [place] [id number], [place] - [address] [facility] [address] [place] [address] Phone: [phone] Fax: [phone]     Social Determinants of Health ([place]) Interventions    Readmission Risk Interventions No flowsheet data found.

## 2020-04-23 NOTE — Evaluation (Signed)
Physical Therapy Evaluation Patient Details Name: Casey French MRN: [id number] DOB: 11-30-1950 Today's Date: 04/23/2020   History of Present Illness  Pt admitted for L THR. History of CKD, depression, DM, GERD, gout, and HTN.  Clinical Impression  Pt is a pleasant 70 year old male who was admitted for L THR. Pt performs bed mobility, transfers, and ambulation with cga and RW. Pt demonstrates deficits with strength/mobility/pain. Would benefit from skilled PT to address above deficits and promote optimal return to PLOF. Recommend transition to [place] upon discharge from acute hospitalization. Will continue to monitor Hgb.     Follow Up Recommendations Home health PT    Equipment Recommendations  Rolling walker with 5" wheels;3in1 (PT)    Recommendations for Other Services       Precautions / Restrictions Precautions Precautions: Fall;Anterior Hip Precaution Booklet Issued: No Restrictions Weight Bearing Restrictions: Yes LLE Weight Bearing: Weight bearing as tolerated      Mobility  Bed Mobility Overal bed mobility: Needs Assistance Bed Mobility: Supine to Sit     Supine to sit: Min guard     General bed mobility comments: follows commands well. Able to easily transition to EOB    Transfers Overall transfer level: Needs assistance Equipment used: Rolling walker (2 wheeled) Transfers: Sit to/from Stand Sit to Stand: Min guard         General transfer comment: upright posture noted with cues for pushing from seated surface. RW used  Ambulation/Gait Ambulation/Gait assistance: Counsellor (Feet): 20 Feet Assistive device: Rolling walker (2 wheeled) Gait Pattern/deviations: Step-to pattern     General Gait Details: ambulated around bed to recliner. Distance limited to low Hgb, recheck in PM. Slow step to gait pattern noted. No reports of dizziness noted  Stairs            Wheelchair Mobility    Modified Rankin (Stroke Patients Only)        Balance Overall balance assessment: Needs assistance Sitting-balance support: Feet supported Sitting balance-Leahy Scale: Good     Standing balance support: Bilateral upper extremity supported Standing balance-Leahy Scale: Good                               Pertinent Vitals/Pain Pain Assessment: 0-10 Pain Score: 5  Pain Location: L hip Pain Descriptors / Indicators: Aching;Operative site guarding Pain Intervention(s): Limited activity within patient's tolerance;RN gave pain meds during session    Home Living Family/patient expects to be discharged to:: Private residence Living Arrangements: Spouse/significant other Available Help at Discharge: Family (multiple family members around) Type of Home: House Home Access: Stairs to enter Entrance Stairs-Rails: Can reach both Entrance Stairs-Number of Steps: 5-6 in front Home Layout: One level Home Equipment: Cane - single point      Prior Function Level of Independence: Independent with assistive device(s)         Comments: has been using SPC occasionally, however mostly indep with all ADLs.     Hand Dominance        Extremity/Trunk Assessment   Upper Extremity Assessment Upper Extremity Assessment: Overall WFL for tasks assessed    Lower Extremity Assessment Lower Extremity Assessment: Generalized weakness (L LE grossly 3/5; R LE grossly 4/5)       Communication   Communication: No difficulties  Cognition Arousal/Alertness: Awake/alert Behavior During Therapy: WFL for tasks assessed/performed Overall Cognitive Status: Within Functional Limits for tasks assessed  General Comments      Exercises Other Exercises Other Exercises: Pt performed supine ther-ex on L LE including AP, quad sets, glut sets, and hip abd/add x 10 reps on L LE min assist required   Assessment/Plan    PT Assessment Patient needs continued PT services  PT  Problem List Decreased strength;Decreased activity tolerance;Decreased mobility;Pain       PT Treatment Interventions DME instruction;Gait training;Stair training;Therapeutic exercise;Balance training    PT Goals (Current goals can be found in the Care Plan section)  Acute Rehab PT Goals Patient Stated Goal: to go home PT Goal Formulation: With patient Time For Goal Achievement: 05/07/20 Potential to Achieve Goals: Good    Frequency BID   Barriers to discharge        Co-evaluation               AM-PAC PT "6 Clicks" Mobility  Outcome Measure Help needed turning from your back to your side while in a flat bed without using bedrails?: None Help needed moving from lying on your back to sitting on the side of a flat bed without using bedrails?: A Little Help needed moving to and from a bed to a chair (including a wheelchair)?: A Little Help needed standing up from a chair using your arms (e.g., wheelchair or bedside chair)?: A Little Help needed to walk in hospital room?: A Little Help needed climbing 3-5 steps with a railing? : A Lot 6 Click Score: 18    End of Session Equipment Utilized During Treatment: Gait belt Activity Tolerance: Patient tolerated treatment well Patient left: in chair;with chair alarm set;with SCD's reapplied Nurse Communication: Mobility status PT Visit Diagnosis: Muscle weakness (generalized) (M62.81);Difficulty in walking, not elsewhere classified (R26.2);Pain Pain - Right/Left: Left Pain - part of body: Hip    Time: [id number] PT Time Calculation (min) (ACUTE ONLY): 24 min   Charges:   PT Evaluation $PT Eval Low Complexity: 1 Low PT Treatments $Therapeutic Exercise: 8-22 mins        Casey French, PT, DPT [phone]   Casey French 04/23/2020, 12:08 PM

## 2020-04-23 NOTE — Progress Notes (Signed)
   Subjective: 1 Day Post-Op Procedure(s) (LRB): TOTAL HIP ARTHROPLASTY ANTERIOR APPROACH (Left) Patient reports pain as 4 on 0-10 scale.   Patient is well, and has had no acute complaints or problems Denies any CP, SOB, ABD pain. We will continue therapy today.  Plan is to go Home after hospital stay.  Objective: Vital signs in last 24 hours: Temp:  [97.2 F (36.2 C)-98.9 F (37.2 C)] 98.9 F (37.2 C) (03/30 0410) Pulse Rate:  [63-94] 94 (03/30 0410) Resp:  [10-25] 16 (03/30 0410) BP: (98-151)/(69-88) 151/79 (03/30 0410) SpO2:  [92 %-100 %] 94 % (03/30 0410) Weight:  [65.3 kg] 65.3 kg (03/29 1732)  Intake/Output from previous day: 03/29 0701 - 03/30 0700 In: 1980 [P.O.:480; I.V.:1500] Out: 1850 [Urine:1700; Blood:150] Intake/Output this shift: No intake/output data recorded.  Recent Labs    04/23/20 0436  HGB 7.0*   Recent Labs    04/23/20 0436  WBC 6.5  RBC 2.23*  HCT 21.1*  PLT 192   Recent Labs    04/23/20 0436  NA 138  K 4.4  CL 113*  CO2 19*  BUN 30*  CREATININE 2.92*  GLUCOSE 119*  CALCIUM 7.6*   No results for input(s): LABPT, INR in the last 72 hours.  EXAM General - Patient is Alert, Appropriate and Oriented Extremity - Neurovascular intact Sensation intact distally Intact pulses distally Dorsiflexion/Plantar flexion intact No cellulitis present Compartment soft Dressing - dressing C/D/I and no drainage, prevena intact with out drainage Motor Function - intact, moving foot and toes well on exam.   Past Medical History:  Diagnosis Date  . Anemia   . Anginal pain ([place])   . Aortic atherosclerosis ([place])   . Atrial fibrillation ([place])   . CKD (chronic kidney disease) stage 3, GFR 30-59 ml/min (H[facility])   . Coronary artery disease   . Depression   . Diabetes mellitus without complication ([place])   . Diverticulosis   . GERD (gastroesophageal reflux disease)   . Gout   . Heart murmur   . High cholesterol   . Hypertension   . STEMI (ST  elevation myocardial infarction) ([place]) 2003   Inferior    Assessment/Plan:   1 Day Post-Op Procedure(s) (LRB): TOTAL HIP ARTHROPLASTY ANTERIOR APPROACH (Left) Active Problems:   S/P hip replacement  Estimated body mass index is 21.26 kg/m as calculated from the following:   Height as of this encounter: '5\' 9"'$  (1.753 m).   Weight as of this encounter: 65.3 kg. Advance diet Up with therapy  Acute post op blood loss anemia with underlying chronic anemia - Start Iron supplement. Recheck Hgb this afternoon. If Hgb <7, transfuse 1 unit of PRBC VSS Pain well controlled CM to assist with discharge  DVT Prophylaxis - Aspirin, Eliquis, TEDs, SCDs Weight-Bearing as tolerated to left leg   T. Rachelle Hora, PA-C [place] 04/23/2020, 7:55 AM

## 2020-04-23 NOTE — Progress Notes (Signed)
Hgb 6.3.  MD notified by secure chat.

## 2020-04-24 ENCOUNTER — Encounter: Payer: Self-pay | Admitting: Orthopedic Surgery

## 2020-04-24 LAB — CBC
HCT: 22.8 % — ABNORMAL LOW (ref 39.0–52.0)
Hemoglobin: 7.5 g/dL — ABNORMAL LOW (ref 13.0–17.0)
MCH: 30.9 pg (ref 26.0–34.0)
MCHC: 32.9 g/dL (ref 30.0–36.0)
MCV: 93.8 fL (ref 80.0–100.0)
Platelets: 181 10*3/uL (ref 150–400)
RBC: 2.43 MIL/uL — ABNORMAL LOW (ref 4.22–5.81)
RDW: 15.9 % — ABNORMAL HIGH (ref 11.5–15.5)
WBC: 8.5 10*3/uL (ref 4.0–10.5)
nRBC: 0 % (ref 0.0–0.2)

## 2020-04-24 LAB — TYPE AND SCREEN
ABO/RH(D): O POS
Antibody Screen: NEGATIVE
Unit division: 0

## 2020-04-24 LAB — BPAM RBC
Blood Product Expiration Date: 202204282359
ISSUE DATE / TIME: 202203301733
Unit Type and Rh: 5100

## 2020-04-24 LAB — HEMOGLOBIN: Hemoglobin: 7.5 g/dL — ABNORMAL LOW (ref 13.0–17.0)

## 2020-04-24 MED ORDER — METHOCARBAMOL 500 MG PO TABS: 500.0000 mg | ORAL_TABLET | Freq: Four times a day (QID) | ORAL | 0 refills | Status: DC | PRN

## 2020-04-24 MED ORDER — TRAMADOL HCL 50 MG PO TABS: 50.0000 mg | ORAL_TABLET | Freq: Four times a day (QID) | ORAL | 0 refills | Status: DC

## 2020-04-24 MED ORDER — MAGNESIUM HYDROXIDE 400 MG/5ML PO SUSP
30.0000 mL | Freq: Once | ORAL | Status: AC
  Administered 2020-04-24: 30 mL via ORAL
  Filled 2020-04-24: qty 30

## 2020-04-24 MED ORDER — HYDROCODONE-ACETAMINOPHEN 5-325 MG PO TABS: 1.0000 | ORAL_TABLET | ORAL | 0 refills | Status: DC | PRN

## 2020-04-24 NOTE — Discharge Summary (Signed)
Physician Discharge Summary  Patient ID: Casey French MRN: [id number] DOB/AGE: 1950/07/24 70 y.o.  Admit date: 04/22/2020 Discharge date: April 26, 2020 Admission Diagnoses:  S/P hip replacement [Z96.649]   Discharge Diagnoses: Patient Active Problem List   Diagnosis Date Noted  . S/P hip replacement 04/22/2020  . Hematemesis 05/15/2019  . Hematochezia 05/15/2019  . Chronic anticoagulation 05/15/2019  . Atrial fibrillation, chronic ([place]) 05/15/2019  . GI bleed 05/15/2019  . Chronic kidney disease 05/15/2019  . Unstable angina ([place]) 11/10/2016  . Angina at rest [facility]) 05/01/2015  . Hypertensive urgency 06/14/2014  . Acute diverticulitis 06/14/2014  . AKI (acute kidney injury) ([place]) 06/14/2014  . Benign essential HTN 06/14/2014  . Hyperlipidemia 06/14/2014  . DM type 2 (diabetes mellitus, type 2) ([place]) 06/14/2014  . CAD (coronary artery disease) 06/14/2014  . GERD without esophagitis 06/14/2014  . Closed head injury 03/24/2013    Past Medical History:  Diagnosis Date  . Anemia   . Anginal pain ([place])   . Aortic atherosclerosis ([place])   . Atrial fibrillation ([place])   . CKD (chronic kidney disease) stage 3, GFR 30-59 ml/min (HCC)   . Coronary artery disease   . Depression   . Diabetes mellitus without complication ([place])   . Diverticulosis   . GERD (gastroesophageal reflux disease)   . Gout   . Heart murmur   . High cholesterol   . Hypertension   . STEMI (ST elevation myocardial infarction) ([place]) 2003   Inferior     Transfusion: 1 unit of PRBC on 04/23/2020   Consultants (if any):   Discharged Condition: Improved  Hospital Course: Casey French is an 70 y.o. male who was admitted 04/22/2020 with a diagnosis of left hip osteoathritis and went to the operating room on 04/22/2020 and underwent the above named procedures.    Surgeries: Procedure(s): TOTAL HIP ARTHROPLASTY ANTERIOR APPROACH on 04/22/2020 Patient tolerated the surgery well. Taken to PACU where  she was stabilized and then transferred to the orthopedic floor.  Started on aspirin and eliquis. Foot pumps applied bilaterally at 80 mm. Heels elevated on bed with rolled towels. No evidence of DVT. Negative [place]. Physical therapy started on day #1 for gait training and transfer. OT started day #1 for ADL and assisted devices.  Patient's foley was d/c on day #1.  On postop day 1, patient was noted to have hemoglobin of 7.0.  Preoperative hemoglobin 8.3.  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  Later on on postop day 1, hemoglobin fell to 6.3, patient was transfused 1 unit of packed red blood cells and the following day hemoglobin was up to 7.5.  On postop day 3 patient's hemoglobin fell down to 6.5 and patient was given a second unit of packed red blood cells.  Patient was doing very well with physical therapy.  Vital signs are stable.   On postop day 4, patient was stable and ready for discharge to home with home health PT  Implants: Medacta AMIS 4 standard stem, 54 mm Mpact TM cup and liner with metal M 28 mm head  He was given perioperative antibiotics:  Anti-infectives (From admission, onward)   Start     Dose/Rate Route Frequency Ordered Stop   04/22/20 2000  ceFAZolin (ANCEF) IVPB 2g/100 mL premix        2 g 200 mL/hr over 30 Minutes Intravenous Every 6 hours 04/22/20 1701 04/23/20 0320   04/22/20 1108  ceFAZolin (ANCEF) 2-4 GM/100ML-% IVPB  Note to Pharmacy: Trudie Reed   : cabinet override      04/22/20 1108 04/22/20 1420   04/22/20 0600  ceFAZolin (ANCEF) IVPB 2g/100 mL premix        2 g 200 mL/hr over 30 Minutes Intravenous On call to O.R. 04/21/20 2302 04/22/20 1419    .  He was given sequential compression devices, early ambulation, and Eliquis for DVT prophylaxis.  He benefited maximally from the hospital stay and there were no complications.    Recent vital signs:  Vitals:   04/24/20 0354 04/24/20 0803  BP: 104/68 111/77  Pulse: 71  76  Resp: 16 17  Temp: 97.9 F (36.6 C) 98.2 F (36.8 C)  SpO2: 92% 93%    Recent laboratory studies:  Lab Results  Component Value Date   HGB 7.5 (L) 04/24/2020   HGB 6.3 (L) 04/23/2020   HGB 7.0 (L) 04/23/2020   Lab Results  Component Value Date   WBC 8.5 04/24/2020   PLT 181 04/24/2020   Lab Results  Component Value Date   INR 1.1 04/09/2020   Lab Results  Component Value Date   NA 138 04/23/2020   K 4.4 04/23/2020   CL 113 (H) 04/23/2020   CO2 19 (L) 04/23/2020   BUN 30 (H) 04/23/2020   CREATININE 2.92 (H) 04/23/2020   GLUCOSE 119 (H) 04/23/2020    Discharge Medications:   Allergies as of 04/24/2020      Reactions   Morphine Anxiety   agitation      Medication List    STOP taking these medications   aspirin-acetaminophen-caffeine 250-250-65 MG tablet Commonly known as: EXCEDRIN MIGRAINE     TAKE these medications   allopurinol 100 MG tablet Commonly known as: ZYLOPRIM Take 100 mg by mouth daily.   amLODipine 10 MG tablet Commonly known as: NORVASC Take 10 mg by mouth daily.   apixaban 2.5 MG Tabs tablet Commonly known as: ELIQUIS Take 2.5 mg by mouth daily.   aspirin 81 MG EC tablet Take 81 mg by mouth daily.   cloNIDine 0.1 MG tablet Commonly known as: CATAPRES Take 1 tablet (0.1 mg total) by mouth 2 (two) times daily.   fexofenadine 180 MG tablet Commonly known as: ALLEGRA Take 180 mg by mouth daily as needed for allergies or rhinitis.   fluticasone 50 MCG/ACT nasal spray Commonly known as: FLONASE Place 2 sprays into both nostrils daily as needed for allergies.   gabapentin 300 MG capsule Commonly known as: NEURONTIN Take 300 mg by mouth at bedtime.   HYDROcodone-acetaminophen 5-325 MG tablet Commonly known as: NORCO/VICODIN Take 1-2 tablets by mouth every 4 (four) hours as needed for moderate pain (pain score 4-6).   hydrocortisone 2.5 % rectal cream Commonly known as: ANUSOL-HC Place 1 application rectally 2 (two) times  daily as needed for hemorrhoids or anal itching.   isosorbide mononitrate 60 MG 24 hr tablet Commonly known as: IMDUR Take 60 mg by mouth daily.   lisinopril 20 MG tablet Commonly known as: ZESTRIL Take 20 mg by mouth daily.   methocarbamol 500 MG tablet Commonly known as: ROBAXIN Take 1 tablet (500 mg total) by mouth every 6 (six) hours as needed for muscle spasms.   metoprolol succinate 100 MG 24 hr tablet Commonly known as: TOPROL-XL Take 150 mg by mouth daily. Take with or immediately following a meal.   nitroGLYCERIN 0.4 MG SL tablet Commonly known as: NITROSTAT Place 0.4 mg under the tongue every 5 (five) minutes as  needed for chest pain.   omeprazole 20 MG capsule Commonly known as: PRILOSEC Take 20 mg by mouth daily.   psyllium 58.6 % packet Commonly known as: METAMUCIL Take 1 packet by mouth 2 (two) times daily.   rosuvastatin 40 MG tablet Commonly known as: CRESTOR Take 40 mg by mouth daily.   torsemide 10 MG tablet Commonly known as: DEMADEX Take 1 tablet (10 mg total) by mouth daily. Hold this medication until you see your nephrologist   traMADol 50 MG tablet Commonly known as: ULTRAM Take 1 tablet (50 mg total) by mouth every 6 (six) hours. What changed:   when to take this  reasons to take this            Durable Medical Equipment  (From admission, onward)         Start     Ordered   04/22/20 1734  DME Walker rolling  Once       Question Answer Comment  Walker: With 5 Inch Wheels   Patient needs a walker to treat with the following condition S/P hip replacement      04/22/20 1733   04/22/20 1734  DME 3 n 1  Once        04/22/20 1733   04/22/20 1734  DME Bedside commode  Once       Question:  Patient needs a bedside commode to treat with the following condition  Answer:  S/P hip replacement   04/22/20 1733          Diagnostic Studies: DG HIP OPERATIVE UNILAT W OR W/O PELVIS LEFT  Result Date: 04/22/2020 CLINICAL DATA:  Elective  surgery. EXAM: OPERATIVE LEFT HIP (WITH PELVIS IF PERFORMED) TECHNIQUE: Fluoroscopic spot image(s) were submitted for interpretation post-operatively. COMPARISON:  None. FINDINGS: Two fluoroscopic spot images of the hip obtained both before and after arthroplasty placement. Total fluoroscopy time 12 seconds. IMPRESSION: Procedural fluoroscopy during hip arthroplasty. Electronically Signed   By: Keith Rake M.D.   On: 04/22/2020 16:10   DG HIP UNILAT W OR W/O PELVIS 2-3 VIEWS LEFT  Result Date: 04/22/2020 CLINICAL DATA:  Status post left hip arthroplasty. EXAM: DG HIP (WITH OR WITHOUT PELVIS) 2-3V LEFT COMPARISON:  None. FINDINGS: Portable AP and cross-table lateral views of the left hip. Left hip arthroplasty in expected alignment. No periprosthetic lucency or fracture. Recent postsurgical change includes air and edema in the soft tissues. Lateral skin staples. Wound VAC is in place. IMPRESSION: Left hip arthroplasty without immediate postoperative complication. Electronically Signed   By: Keith Rake M.D.   On: 04/22/2020 16:08    Disposition:      Follow-up Information    Duanne Guess, PA-C Follow up in 2 week(s).   Specialties: Orthopedic Surgery, Emergency Medicine Contact information: [place] [address] [phone]                Signed: Feliberto Gottron 04/24/2020, 8:51 AM

## 2020-04-24 NOTE — Progress Notes (Signed)
   Subjective: 2 Days Post-Op Procedure(s) (LRB): TOTAL HIP ARTHROPLASTY ANTERIOR APPROACH (Left) Patient reports pain as mild.   Patient is well, and has had no acute complaints or problems Denies any CP, SOB, ABD pain. We will continue therapy today.  Plan is to go Home after hospital stay.  Objective: Vital signs in last 24 hours: Temp:  [97.5 F (36.4 C)-98.4 F (36.9 C)] 98.2 F (36.8 C) (03/31 0803) Pulse Rate:  [66-76] 76 (03/31 0803) Resp:  [15-18] 17 (03/31 0803) BP: (98-119)/(64-77) 111/77 (03/31 0803) SpO2:  [90 %-95 %] 93 % (03/31 0803)  Intake/Output from previous day: 03/30 0701 - 03/31 0700 In: 3354.1 [P.O.:600; I.V.:2350.1; Blood:404] Out: 800 [Urine:800] Intake/Output this shift: No intake/output data recorded.  Recent Labs    04/23/20 0436 04/23/20 1453 04/24/20 0528  HGB 7.0* 6.3* 7.5*   Recent Labs    04/23/20 1453 04/24/20 0528  WBC 9.0 8.5  RBC 2.01* 2.43*  HCT 19.7* 22.8*  PLT 186 181   Recent Labs    04/23/20 0436  NA 138  K 4.4  CL 113*  CO2 19*  BUN 30*  CREATININE 2.92*  GLUCOSE 119*  CALCIUM 7.6*   No results for input(s): LABPT, INR in the last 72 hours.  EXAM General - Patient is Alert, Appropriate and Oriented Extremity - Neurovascular intact Sensation intact distally Intact pulses distally Dorsiflexion/Plantar flexion intact No cellulitis present Compartment soft Dressing - dressing C/D/I and no drainage, prevena intact with out drainage Motor Function - intact, moving foot and toes well on exam.   Past Medical History:  Diagnosis Date  . Anemia   . Anginal pain ([place])   . Aortic atherosclerosis ([place])   . Atrial fibrillation ([place])   . CKD (chronic kidney disease) stage 3, GFR 30-59 ml/min (HCC)   . Coronary artery disease   . Depression   . Diabetes mellitus without complication ([place])   . Diverticulosis   . GERD (gastroesophageal reflux disease)   . Gout   . Heart murmur   . High cholesterol   .  Hypertension   . STEMI (ST elevation myocardial infarction) ([place]) 2003   Inferior    Assessment/Plan:   2 Days Post-Op Procedure(s) (LRB): TOTAL HIP ARTHROPLASTY ANTERIOR APPROACH (Left) Active Problems:   S/P hip replacement  Estimated body mass index is 21.26 kg/m as calculated from the following:   Height as of this encounter: '5\' 9"'$  (1.753 m).   Weight as of this encounter: 65.3 kg. Advance diet Up with therapy  Acute post op blood loss anemia with underlying chronic anemia - s/p 1 unit of PRBC 04/23/20. Continue Iron supplement. Recheck Hgb this afternoon. If Hgb <7, transfuse 1 unit of PRBC VSS Pain well controlled CM to assist with discharge to home with HHPT tomorrow  DVT Prophylaxis - Aspirin, Eliquis, TEDs, SCDs Weight-Bearing as tolerated to left leg   T. Rachelle Hora, PA-C [place] 04/24/2020, 8:47 AM

## 2020-04-24 NOTE — TOC Progression Note (Signed)
Transition of Care [facility]) - Progression Note    Patient Details  Name: Casey French MRN: [id number] Date of Birth: 04/24/1950  Transition of Care [facility]) CM/SW [place], RN Phone Number: 04/24/2020, 1:20 PM  Clinical Narrative:   TOC advised patient will remain hospitalized today.  Has [facility], [facility] advised.  DME was delivered by [employer].  TOC will continue to follow through discharge.    Expected Discharge Plan: [place] Barriers to Discharge: Continued Medical Work up  Expected Discharge Plan and Services Expected Discharge Plan: [place] In-house Referral: NA   Post Acute Care Choice: [place] arrangements for the past 2 months: Single Family Home                 DME Arranged: 3-N-1,Walker rolling DME Agency: [employer] Date DME Agency Contacted: 04/23/20 Time DME Agency Contacted: 38 Representative spoke with at DME Agency: [place]:  (Pre arranged with [facility] by MD office) [place]: [facility]         Social Determinants of Health ([place]) Interventions    Readmission Risk Interventions Readmission Risk Prevention Plan 04/24/2020  Transportation Screening Complete  PCP or Specialist Appt within 3-5 Days Complete  HRI or [place] Complete  Social Work Consult for [place] Planning/Counseling [place] Not Applicable  Medication Review Press photographer) Complete  Some recent data might be hidden

## 2020-04-24 NOTE — Progress Notes (Signed)
Occupational Therapy Treatment Patient Details Name: Casey French MRN: [id number] DOB: 1950/11/28 Today's Date: 04/24/2020    History of present illness Pt admitted for L THR. History of CKD, depression, DM, GERD, gout, and HTN.   OT comments  Pt seen for OT treatment on this date. Upon arrival to room, pt awake and seated upright in chair. Pt agreeable to tx and verbalizing desire to don clothes from home. Pt required SUPERVISION to don pants and overhead shirt while seated, SUPERVISION to perform toilet transfer, and SUPERVISION to walk household distances with RW. Pt is making good progress toward goals and continues to benefit from skilled OT services to maximize return to PLOF and minimize risk of future falls, injury, caregiver burden, and readmission. Will continue to follow POC. Discharge recommendation remains appropriate.    Follow Up Recommendations  Home health OT;Supervision - Intermittent    Equipment Recommendations  3 in 1 bedside commode       Precautions / Restrictions Precautions Precautions: Fall;Anterior Hip Precaution Booklet Issued: Yes (comment) Restrictions Weight Bearing Restrictions: Yes LLE Weight Bearing: Weight bearing as tolerated       Mobility Bed Mobility Overal bed mobility: Needs Assistance Bed Mobility: Supine to Sit     Supine to sit: Min guard     General bed mobility comments: safe technique    Transfers Overall transfer level: Needs assistance Equipment used: Rolling walker (2 wheeled) Transfers: Sit to/from Stand Sit to Stand: Supervision         General transfer comment: Safe hand placement    Balance Overall balance assessment: Needs assistance Sitting-balance support: No upper extremity supported;Feet supported Sitting balance-Leahy Scale: Good Sitting balance - Comments: Able reach outside of BOS to don LB pants, no LOB observed   Standing balance support: Bilateral upper extremity supported;During functional  activity Standing balance-Leahy Scale: Good                             ADL either performed or assessed with clinical judgement   ADL Overall ADL's : Needs assistance/impaired                 Upper Body Dressing : Supervision/safety;Sitting;Set up Upper Body Dressing Details (indicate cue type and reason): To don overhead shirt Lower Body Dressing: Supervision/safety;Sit to/from stand;Set up Lower Body Dressing Details (indicate cue type and reason): To don pants Toilet Transfer: Supervision/safety;Ambulation;RW;Comfort height toilet           Functional mobility during ADLs: Supervision/safety;Rolling walker                 Cognition Arousal/Alertness: Awake/alert Behavior During Therapy: WFL for tasks assessed/performed Overall Cognitive Status: Within Functional Limits for tasks assessed                                                     Pertinent Vitals/ Pain       Pain Assessment: 0-10 Pain Score: 6  Pain Location: L hip Pain Descriptors / Indicators: Aching;Operative site guarding Pain Intervention(s): Limited activity within patient's tolerance;Monitored during session         Frequency  Min 1X/week        Progress Toward Goals  OT Goals(current goals can now be found in the care plan section)  Progress towards OT goals: Progressing  toward goals  Acute Rehab OT Goals Patient Stated Goal: to go home OT Goal Formulation: With patient Time For Goal Achievement: 05/07/20 Potential to Achieve Goals: Good  Plan Discharge plan remains appropriate;Frequency remains appropriate       AM-PAC OT "6 Clicks" Daily Activity     Outcome Measure   Help from another person eating meals?: None Help from another person taking care of personal grooming?: A Little Help from another person toileting, which includes using toliet, bedpan, or urinal?: A Little Help from another person bathing (including washing, rinsing,  drying)?: A Lot Help from another person to put on and taking off regular upper body clothing?: A Little Help from another person to put on and taking off regular lower body clothing?: A Little 6 Click Score: 18    End of Session Equipment Utilized During Treatment: Rolling walker  OT Visit Diagnosis: Unsteadiness on feet (R26.81);History of falling (Z91.81)   Activity Tolerance Patient tolerated treatment well   Patient Left in chair;with call bell/phone within reach;with chair alarm set   Nurse Communication Mobility status        Time: [id number] OT Time Calculation (min): 17 min  Charges: OT General Charges $OT Visit: 1 Visit OT Treatments $Self Care/Home Management : 8-22 mins  Fredirick Maudlin, OTR/L [place]

## 2020-04-24 NOTE — Plan of Care (Signed)

## 2020-04-24 NOTE — Discharge Instructions (Signed)

## 2020-04-24 NOTE — Progress Notes (Signed)
PT Cancellation Note  Patient Details Name: Casey French MRN: [id number] DOB: 1950-04-05   Cancelled Treatment:    Reason Eval/Treat Not Completed: Other (comment). Upon arrival, pt just returned to bed. Reports he has no questions for therapist nor needs to review anything from previous session. Pt has met PT goals, treatment not needed in PM. Will plan for 1 more session in AM prior to discharge. Pt in agreement.   Cyan Clippinger 04/24/2020, 3:58 PM Greggory Stallion, PT, DPT [phone]

## 2020-04-24 NOTE — Progress Notes (Addendum)
Physical Therapy Treatment Patient Details Name: Casey French MRN: [id number] DOB: 04/01/1950 Today's Date: 04/24/2020    History of Present Illness Pt admitted for L THR. History of CKD, depression, DM, GERD, gout, and HTN.    PT Comments    Pt is making good efforts with therapy. Stable for dc from PT perspective, messaged PA. Able to ambulate nurses station, do stairs effectively, and good technique with HEP. Adjusted RW to correct height. Pain under control. Will continue to progress as able.   Follow Up Recommendations  Home health PT     Equipment Recommendations  Rolling walker with 5" wheels;3in1 (PT)    Recommendations for Other Services       Precautions / Restrictions Precautions Precautions: Fall;Anterior Hip Precaution Booklet Issued: Yes (comment) Restrictions Weight Bearing Restrictions: Yes LLE Weight Bearing: Weight bearing as tolerated    Mobility  Bed Mobility Overal bed mobility: Needs Assistance Bed Mobility: Supine to Sit     Supine to sit: Min guard     General bed mobility comments: safe technique    Transfers Overall transfer level: Needs assistance Equipment used: Rolling walker (2 wheeled) Transfers: Sit to/from Stand Sit to Stand: Min guard         General transfer comment: decreased cues for hand placement. Upright posture.  Ambulation/Gait Ambulation/Gait assistance: Min guard Gait Distance (Feet): 250 Feet Assistive device: Rolling walker (2 wheeled) Gait Pattern/deviations: Step-through pattern     General Gait Details: ambulation performed in hallway with reciprocal gait pattern and upright gaze. RW used. Still with slow speed.   Stairs Stairs: Yes Stairs assistance: Min guard Stair Management: Two rails;Step to pattern;Forwards Number of Stairs: 4 General stair comments: safe technique. No concerns. Follows directions well   Wheelchair Mobility    Modified Rankin (Stroke Patients Only)        Balance Overall balance assessment: Needs assistance Sitting-balance support: No upper extremity supported;Feet supported Sitting balance-Leahy Scale: Good     Standing balance support: Bilateral upper extremity supported Standing balance-Leahy Scale: Good                              Cognition Arousal/Alertness: Awake/alert Behavior During Therapy: WFL for tasks assessed/performed Overall Cognitive Status: Within Functional Limits for tasks assessed                                        Exercises Other Exercises Other Exercises: Pt performed supine ther-ex on L LE including AP, quad sets, glut sets, SAQ, heel slides, and hip add squeezes, and hip abd/add x 12 reps on L LE cga required    General Comments        Pertinent Vitals/Pain Pain Assessment: 0-10 Pain Score: 4  Pain Location: L hip Pain Descriptors / Indicators: Aching;Operative site guarding Pain Intervention(s): Limited activity within patient's tolerance    Home Living                      Prior Function            PT Goals (current goals can now be found in the care plan section) Acute Rehab PT Goals Patient Stated Goal: to go home PT Goal Formulation: With patient Time For Goal Achievement: 05/07/20 Potential to Achieve Goals: Good Progress towards PT goals: Progressing toward goals    Frequency  BID      PT Plan Current plan remains appropriate    Co-evaluation              AM-PAC PT "6 Clicks" Mobility   Outcome Measure  Help needed turning from your back to your side while in a flat bed without using bedrails?: None Help needed moving from lying on your back to sitting on the side of a flat bed without using bedrails?: A Little Help needed moving to and from a bed to a chair (including a wheelchair)?: A Little Help needed standing up from a chair using your arms (e.g., wheelchair or bedside chair)?: A Little Help needed to walk in hospital  room?: A Little Help needed climbing 3-5 steps with a railing? : A Lot 6 Click Score: 18    End of Session Equipment Utilized During Treatment: Gait belt Activity Tolerance: Patient tolerated treatment well Patient left: in chair;with chair alarm set Nurse Communication: Mobility status PT Visit Diagnosis: Muscle weakness (generalized) (M62.81);Difficulty in walking, not elsewhere classified (R26.2);Pain Pain - Right/Left: Left Pain - part of body: Hip     Time: [id number] PT Time Calculation (min) (ACUTE ONLY): 29 min  Charges:  $Gait Training: 8-22 mins $Therapeutic Exercise: 8-22 mins                     Greggory Stallion, PT, DPT [phone]    Kassidi Elza 04/24/2020, 12:54 PM

## 2020-04-25 LAB — SURGICAL PATHOLOGY

## 2020-04-25 LAB — CBC
HCT: 20.3 % — ABNORMAL LOW (ref 39.0–52.0)
Hemoglobin: 6.5 g/dL — ABNORMAL LOW (ref 13.0–17.0)
MCH: 30.2 pg (ref 26.0–34.0)
MCHC: 32 g/dL (ref 30.0–36.0)
MCV: 94.4 fL (ref 80.0–100.0)
Platelets: 151 10*3/uL (ref 150–400)
RBC: 2.15 MIL/uL — ABNORMAL LOW (ref 4.22–5.81)
RDW: 15.7 % — ABNORMAL HIGH (ref 11.5–15.5)
WBC: 6.6 10*3/uL (ref 4.0–10.5)
nRBC: 0 % (ref 0.0–0.2)

## 2020-04-25 LAB — HEMOGLOBIN AND HEMATOCRIT, BLOOD
HCT: 25 % — ABNORMAL LOW (ref 39.0–52.0)
Hemoglobin: 8 g/dL — ABNORMAL LOW (ref 13.0–17.0)

## 2020-04-25 LAB — PREPARE RBC (CROSSMATCH)

## 2020-04-25 MED ORDER — SODIUM CHLORIDE 0.9% IV SOLUTION: Freq: Once | INTRAVENOUS | Status: AC

## 2020-04-25 NOTE — Plan of Care (Signed)
Reviewed POC with patient.  Questions answered and understanding verbalized.

## 2020-04-25 NOTE — Progress Notes (Addendum)
   Subjective: 3 Days Post-Op Procedure(s) (LRB): TOTAL HIP ARTHROPLASTY ANTERIOR APPROACH (Left) Patient reports pain as mild.   Patient is well, and has had no acute complaints or problems Denies any CP, SOB, ABD pain. We will continue therapy today.  Plan is to go Home after hospital stay.  Objective: Vital signs in last 24 hours: Temp:  [97.9 F (36.6 C)-98.2 F (36.8 C)] 97.9 F (36.6 C) (04/01 0434) Pulse Rate:  [71-85] 85 (04/01 0434) Resp:  [17-20] 20 (04/01 0434) BP: (100-111)/(59-77) 107/64 (04/01 0434) SpO2:  [90 %-94 %] 90 % (04/01 0434)  Intake/Output from previous day: 03/31 0701 - 04/01 0700 In: 240 [P.O.:240] Out: 450 [Urine:450] Intake/Output this shift: No intake/output data recorded.  Recent Labs    04/23/20 0436 04/23/20 1453 04/24/20 0528 04/24/20 1442 04/25/20 0722  HGB 7.0* 6.3* 7.5* 7.5* 6.5*   Recent Labs    04/24/20 0528 04/25/20 0722  WBC 8.5 6.6  RBC 2.43* 2.15*  HCT 22.8* 20.3*  PLT 181 151   Recent Labs    04/23/20 0436  NA 138  K 4.4  CL 113*  CO2 19*  BUN 30*  CREATININE 2.92*  GLUCOSE 119*  CALCIUM 7.6*   No results for input(s): LABPT, INR in the last 72 hours.  EXAM General - Patient is Alert, Appropriate and Oriented Extremity - Neurovascular intact Sensation intact distally Intact pulses distally Dorsiflexion/Plantar flexion intact No cellulitis present Compartment soft Dressing - dressing C/D/I and no drainage, prevena intact with out drainage Motor Function - intact, moving foot and toes well on exam.   Past Medical History:  Diagnosis Date  . Anemia   . Anginal pain ([place])   . Aortic atherosclerosis ([place])   . Atrial fibrillation ([place])   . CKD (chronic kidney disease) stage 3, GFR 30-59 ml/min (HCC)   . Coronary artery disease   . Depression   . Diabetes mellitus without complication ([place])   . Diverticulosis   . GERD (gastroesophageal reflux disease)   . Gout   . Heart murmur   . High  cholesterol   . Hypertension   . STEMI (ST elevation myocardial infarction) ([place]) 2003   Inferior    Assessment/Plan:   3 Days Post-Op Procedure(s) (LRB): TOTAL HIP ARTHROPLASTY ANTERIOR APPROACH (Left) Active Problems:   S/P hip replacement  Estimated body mass index is 21.26 kg/m as calculated from the following:   Height as of this encounter: '5\' 9"'$  (1.753 m).   Weight as of this encounter: 65.3 kg. Advance diet Up with therapy  Acute post op blood loss anemia with underlying chronic anemia secondary to chronic kidney disease - s/p 1 unit of PRBC 04/23/20. Today Hgb 6.5. Transfuse 2nd unit of PRBC. VSS Pain well controlled CM to assist with discharge to home with HHPT  DVT Prophylaxis - Aspirin, Eliquis, TEDs, SCDs Weight-Bearing as tolerated to left leg   T. Rachelle Hora, PA-C [place] 04/25/2020, 7:48 AM

## 2020-04-25 NOTE — Progress Notes (Signed)
PT Cancellation Note  Patient Details Name: Casey French MRN: [id number] DOB: February 21, 1950   Cancelled Treatment:     PT hold till later this date. Will require transfusion.   Willette Pa 04/25/2020, 10:20 AM

## 2020-04-25 NOTE — Care Management Important Message (Signed)
Important Message  Patient Details  Name: ABRIEN SARKISYAN MRN: [id number] Date of Birth: 17-Dec-1950   Medicare Important Message Given:  Yes     Juliann Pulse A Delvon Chipps 04/25/2020, 11:06 AM

## 2020-04-25 NOTE — Progress Notes (Signed)
PT Cancellation Note  Patient Details Name: Casey French MRN: [id number] DOB: 1950-09-12   Cancelled Treatment:     Pt still awaiting transfusion. Will hold PT today and return tomorrow.    Willette Pa 04/25/2020, 1:52 PM

## 2020-04-26 LAB — CREATININE, SERUM
Creatinine, Ser: 4.1 mg/dL — ABNORMAL HIGH (ref 0.61–1.24)
GFR, Estimated: 15 mL/min — ABNORMAL LOW (ref >60)

## 2020-04-26 LAB — CBC
HCT: 23.9 % — ABNORMAL LOW (ref 39.0–52.0)
Hemoglobin: 7.8 g/dL — ABNORMAL LOW (ref 13.0–17.0)
MCH: 30.2 pg (ref 26.0–34.0)
MCHC: 32.6 g/dL (ref 30.0–36.0)
MCV: 92.6 fL (ref 80.0–100.0)
Platelets: 194 10*3/uL (ref 150–400)
RBC: 2.58 MIL/uL — ABNORMAL LOW (ref 4.22–5.81)
RDW: 14.7 % (ref 11.5–15.5)
WBC: 6.6 10*3/uL (ref 4.0–10.5)
nRBC: 0 % (ref 0.0–0.2)

## 2020-04-26 NOTE — Progress Notes (Signed)
   Subjective: 4 Days Post-Op Procedure(s) (LRB): TOTAL HIP ARTHROPLASTY ANTERIOR APPROACH (Left) Patient reports pain as mild.   Patient is well, and has had no acute complaints or problems Denies any CP, SOB, ABD pain. We will continue therapy today.  Plan is to go Home after hospital stay.  Objective: Vital signs in last 24 hours: Temp:  [98 F (36.7 C)-98.8 F (37.1 C)] 98 F (36.7 C) (04/02 0408) Pulse Rate:  [75-95] 86 (04/02 0408) Resp:  [16-18] 18 (04/02 0408) BP: (105-127)/(64-79) 112/79 (04/02 0408) SpO2:  [90 %-96 %] 94 % (04/02 0408)  Intake/Output from previous day: 04/01 0701 - 04/02 0700 In: 528.8 [P.O.:240; Blood:288.8] Out: 1950 [Urine:1950] Intake/Output this shift: Total I/O In: -  Out: 500 [Urine:500]  Recent Labs    04/23/20 1453 04/24/20 0528 04/24/20 1442 04/25/20 0722 04/25/20 1745  HGB 6.3* 7.5* 7.5* 6.5* 8.0*   Recent Labs    04/24/20 0528 04/25/20 0722 04/25/20 1745  WBC 8.5 6.6  --   RBC 2.43* 2.15*  --   HCT 22.8* 20.3* 25.0*  PLT 181 151  --    No results for input(s): NA, K, CL, CO2, BUN, CREATININE, GLUCOSE, CALCIUM in the last 72 hours. No results for input(s): LABPT, INR in the last 72 hours.  EXAM General - Patient is Alert, Appropriate and Oriented Extremity - Neurovascular intact Sensation intact distally Intact pulses distally Dorsiflexion/Plantar flexion intact No cellulitis present Compartment soft Dressing - dressing C/D/I and no drainage, prevena intact with out drainage Motor Function - intact, moving foot and toes well on exam.   Past Medical History:  Diagnosis Date  . Anemia   . Anginal pain ([place])   . Aortic atherosclerosis ([place])   . Atrial fibrillation ([place])   . CKD (chronic kidney disease) stage 3, GFR 30-59 ml/min (HCC)   . Coronary artery disease   . Depression   . Diabetes mellitus without complication ([place])   . Diverticulosis   . GERD (gastroesophageal reflux disease)   . Gout   . Heart  murmur   . High cholesterol   . Hypertension   . STEMI (ST elevation myocardial infarction) ([place]) 2003   Inferior    Assessment/Plan:   4 Days Post-Op Procedure(s) (LRB): TOTAL HIP ARTHROPLASTY ANTERIOR APPROACH (Left) Active Problems:   S/P hip replacement  Estimated body mass index is 21.26 kg/m as calculated from the following:   Height as of this encounter: '5\' 9"'$  (1.753 m).   Weight as of this encounter: 65.3 kg. Advance diet Up with therapy  Acute post op blood loss anemia with underlying chronic anemia secondary to chronic kidney disease - s/p 1 unit of PRBC 04/23/20.  Status post second unit of packed red blood cell on April 25, 2020 with hemoglobin of 8.0.  Recheck labs this morning.  VSS Pain well controlled CM to assist with discharge to home with HHPT  DVT Prophylaxis - Aspirin, Eliquis, TEDs, SCDs Weight-Bearing as tolerated to left leg  Reche Dixon, PA-C [place] 04/26/2020, 5:01 AM

## 2020-04-26 NOTE — TOC Progression Note (Signed)
Transition of Care [facility]) - Progression Note    Patient Details  Name: SUMEDH CARELOCK MRN: [id number] Date of Birth: Jun 23, 1950  Transition of [facility]) CM/SW [place], RN Phone Number: 04/26/2020, 9:08 AM  Clinical Narrative:   Patient is [facility] up with [facility] for [facility] services, DME  Has been delivered to the room, Ready for DC from [facility] standpoint    Expected Discharge Plan: [place] Barriers to Discharge: Continued Medical Work up  Expected Discharge Plan and Services Expected Discharge Plan: [place] In-house Referral: NA   Post Acute Care Choice: [place] arrangements for the past 2 months: Single Family Home Expected Discharge Date: 04/26/20               DME Arranged: 3-N-1,Walker rolling DME Agency: [employer] Date DME Agency Contacted: 04/23/20 Time DME Agency Contacted: 77 Representative spoke with at DME Agency: Lou Cal Arranged:  (Pre arranged with [facility] by MD office) [place] Agency: [facility] ([facility])         Social Determinants of Health ([place]) Interventions    Readmission Risk Interventions Readmission Risk Prevention Plan 04/24/2020  Transportation Screening Complete  PCP or Specialist Appt within 3-5 Days Complete  HRI or [place] Complete  Social Work Consult for [place] Planning/Counseling Complete  Palliative Care Screening Not Applicable  Medication Review Press photographer) Complete  Some recent data might be hidden

## 2020-04-26 NOTE — Progress Notes (Signed)
AVS reviewed with patient and wife. No questions or concerns at this time. Patient discharging home with walker and BSC. VSS, patient is adequate for discharge.

## 2020-04-26 NOTE — Progress Notes (Signed)
Physical Therapy Treatment Patient Details Name: Casey French MRN: [id number] DOB: Jan 08, 1951 Today's Date: 04/26/2020    History of Present Illness Pt admitted for L THR. History of CKD, depression, DM, GERD, gout, and HTN.    PT Comments    Patient received reclining in bed and is agreeable to physical therapy. Reports no pain at rest and states he has been doing bed exercises from his HEP. Patient completed bed mobility and sit <> stand mod I with HOB elevated and stood to RW. Ambulated ~ 250 feet with RW and supervision with cuing for improved gait pattern. Ascended/descended 4 steps with B UE support on handrails and supervision. Demo safe AD handling techniques and good carry over from previous sessions. Denies lightheadedness throughout session and HR/SpO2 [facility]. Patient has achieved all PT goals in this setting and is ready for discharge from a PT standpoint. Will continue to work with him on improved gait pattern, functional strength, and mobility during the remainder of his hospital stay to help him return to PLOF. Patient would benefit from continued skilled physical therapy to address remaining impairments and functional limitations to work towards stated goals and return to PLOF or maximal functional independence. Patient left in chair with needs within reach and alarm set.      Follow Up Recommendations  Home health PT     Equipment Recommendations  Rolling walker with 5" wheels;3in1 (PT)    Recommendations for Other Services       Precautions / Restrictions Precautions Precautions: Fall;Anterior Hip Restrictions Weight Bearing Restrictions: Yes LLE Weight Bearing: Weight bearing as tolerated    Mobility  Bed Mobility Overal bed mobility: Modified Independent Bed Mobility: Supine to Sit     Supine to sit: Modified independent (Device/Increase time);HOB elevated          Transfers Overall transfer level: Needs assistance Equipment used: Rolling walker (2  wheeled) Transfers: Sit to/from Stand Sit to Stand: Supervision         General transfer comment: Safe hand placement  Ambulation/Gait Ambulation/Gait assistance: Supervision Gait Distance (Feet): 250 Feet Assistive device: Rolling walker (2 wheeled) Gait Pattern/deviations: Step-through pattern     General Gait Details: ambulation performed in hallway with reciprocal gait pattern and upright gaze. RW used. Still with slow speed and decreased heel strike L LE, decreased R step length that improves with additional walking.   Stairs   Stairs assistance: Supervision Stair Management: Two rails;Step to pattern;Forwards   General stair comments: safe technique. No concerns. Remembers correct technique well   Wheelchair Mobility    Modified Rankin (Stroke Patients Only)       Balance Overall balance assessment: Needs assistance Sitting-balance support: No upper extremity supported;Feet supported Sitting balance-Leahy Scale: Good Sitting balance - Comments: Able reach outside of BOS to don LB pants, no LOB observed   Standing balance support: Bilateral upper extremity supported;During functional activity Standing balance-Leahy Scale: Good Standing balance comment: Needs B UE support on RW during ambulation                            Cognition Arousal/Alertness: Awake/alert Behavior During Therapy: WFL for tasks assessed/performed Overall Cognitive Status: Within Functional Limits for tasks assessed                                        Exercises  General Comments        Pertinent Vitals/Pain Pain Assessment: Faces Faces Pain Scale: Hurts a little bit Pain Location: L hip with walking Pain Intervention(s): Limited activity within patient's tolerance;Monitored during session;Repositioned    Home Living                      Prior Function            PT Goals (current goals can now be found in the care plan section)  Acute Rehab PT Goals Patient Stated Goal: to go home PT Goal Formulation: With patient Time For Goal Achievement: 05/07/20 Potential to Achieve Goals: Good Progress towards PT goals: Progressing toward goals    Frequency    BID      PT Plan Current plan remains appropriate    Co-evaluation              AM-PAC PT "6 Clicks" Mobility   Outcome Measure  Help needed turning from your back to your side while in a flat bed without using bedrails?: None Help needed moving from lying on your back to sitting on the side of a flat bed without using bedrails?: None Help needed moving to and from a bed to a chair (including a wheelchair)?: A Little Help needed standing up from a chair using your arms (e.g., wheelchair or bedside chair)?: A Little Help needed to walk in hospital room?: A Little Help needed climbing 3-5 steps with a railing? : A Little 6 Click Score: 20    End of Session Equipment Utilized During Treatment: Gait belt Activity Tolerance: Patient tolerated treatment well Patient left: in chair;with chair alarm set;with call bell/phone within reach Nurse Communication: Mobility status PT Visit Diagnosis: Muscle weakness (generalized) (M62.81);Difficulty in walking, not elsewhere classified (R26.2);Pain Pain - Right/Left: Left Pain - part of body: Hip     Time: [id number] PT Time Calculation (min) (ACUTE ONLY): 25 min  Charges:  $Therapeutic Activity: 23-37 mins                    Everlean Alstrom. Graylon Good, PT, DPT 04/26/20, 9:40 AM

## 2020-04-27 LAB — BPAM RBC
Blood Product Expiration Date: 202204292359
ISSUE DATE / TIME: 202204011357
Unit Type and Rh: 5100

## 2020-04-27 LAB — TYPE AND SCREEN
ABO/RH(D): O POS
Antibody Screen: NEGATIVE
Unit division: 0

## 2020-06-18 ENCOUNTER — Inpatient Hospital Stay: Payer: Medicare PPO

## 2020-06-18 ENCOUNTER — Other Ambulatory Visit: Payer: Self-pay

## 2020-06-18 ENCOUNTER — Other Ambulatory Visit: Payer: Self-pay | Admitting: Oncology

## 2020-06-18 ENCOUNTER — Inpatient Hospital Stay: Payer: Medicare PPO | Attending: Oncology | Admitting: Oncology

## 2020-06-18 ENCOUNTER — Encounter: Payer: Self-pay | Admitting: Oncology

## 2020-06-18 ENCOUNTER — Ambulatory Visit: Payer: Medicare PPO

## 2020-06-18 VITALS — BP 124/77 | HR 75 | Temp 96.8°F | Resp 16 | Wt 156.4 lb

## 2020-06-18 DIAGNOSIS — N051 Unspecified nephritic syndrome with focal and segmental glomerular lesions: Secondary | ICD-10-CM | POA: Diagnosis not present

## 2020-06-18 DIAGNOSIS — Z87891 Personal history of nicotine dependence: Secondary | ICD-10-CM | POA: Diagnosis not present

## 2020-06-18 DIAGNOSIS — Z7982 Long term (current) use of aspirin: Secondary | ICD-10-CM | POA: Diagnosis not present

## 2020-06-18 DIAGNOSIS — D631 Anemia in chronic kidney disease: Secondary | ICD-10-CM

## 2020-06-18 DIAGNOSIS — Z7901 Long term (current) use of anticoagulants: Secondary | ICD-10-CM

## 2020-06-18 DIAGNOSIS — I252 Old myocardial infarction: Secondary | ICD-10-CM

## 2020-06-18 DIAGNOSIS — N184 Chronic kidney disease, stage 4 (severe): Secondary | ICD-10-CM | POA: Diagnosis not present

## 2020-06-18 DIAGNOSIS — Z79620 Long term (current) use of immunosuppressive biologic: Secondary | ICD-10-CM

## 2020-06-18 DIAGNOSIS — Z79899 Other long term (current) drug therapy: Secondary | ICD-10-CM | POA: Diagnosis not present

## 2020-06-18 LAB — CBC WITH DIFFERENTIAL/PLATELET
Abs Immature Granulocytes: 0.05 10*3/uL (ref 0.00–0.07)
Basophils Absolute: 0.1 10*3/uL (ref 0.0–0.1)
Basophils Relative: 1 %
Eosinophils Absolute: 0.1 10*3/uL (ref 0.0–0.5)
Eosinophils Relative: 2 %
HCT: 24.2 % — ABNORMAL LOW (ref 39.0–52.0)
Hemoglobin: 7.5 g/dL — ABNORMAL LOW (ref 13.0–17.0)
Immature Granulocytes: 1 %
Lymphocytes Relative: 21 %
Lymphs Abs: 1.6 10*3/uL (ref 0.7–4.0)
MCH: 29.5 pg (ref 26.0–34.0)
MCHC: 31 g/dL (ref 30.0–36.0)
MCV: 95.3 fL (ref 80.0–100.0)
Monocytes Absolute: 0.6 10*3/uL (ref 0.1–1.0)
Monocytes Relative: 8 %
Neutro Abs: 5.1 10*3/uL (ref 1.7–7.7)
Neutrophils Relative %: 67 %
Platelets: 229 10*3/uL (ref 150–400)
RBC: 2.54 MIL/uL — ABNORMAL LOW (ref 4.22–5.81)
RDW: 15.4 % (ref 11.5–15.5)
WBC: 7.5 10*3/uL (ref 4.0–10.5)
nRBC: 0 % (ref 0.0–0.2)

## 2020-06-18 LAB — COMPREHENSIVE METABOLIC PANEL
ALT: 9 U/L (ref 0–44)
AST: 11 U/L — ABNORMAL LOW (ref 15–41)
Albumin: 1.9 g/dL — ABNORMAL LOW (ref 3.5–5.0)
Alkaline Phosphatase: 120 U/L (ref 38–126)
Anion gap: 4 — ABNORMAL LOW (ref 5–15)
BUN: 35 mg/dL — ABNORMAL HIGH (ref 8–23)
CO2: 21 mmol/L — ABNORMAL LOW (ref 22–32)
Calcium: 7.5 mg/dL — ABNORMAL LOW (ref 8.9–10.3)
Chloride: 114 mmol/L — ABNORMAL HIGH (ref 98–111)
Creatinine, Ser: 3.85 mg/dL — ABNORMAL HIGH (ref 0.61–1.24)
GFR, Estimated: 16 mL/min — ABNORMAL LOW (ref >60)
Glucose, Bld: 86 mg/dL (ref 70–99)
Potassium: 4.2 mmol/L (ref 3.5–5.1)
Sodium: 139 mmol/L (ref 135–145)
Total Bilirubin: 0.2 mg/dL — ABNORMAL LOW (ref 0.3–1.2)
Total Protein: 5.5 g/dL — ABNORMAL LOW (ref 6.5–8.1)

## 2020-06-18 LAB — RETICULOCYTES
Immature Retic Fract: 13.9 % (ref 2.3–15.9)
RBC.: 2.5 MIL/uL — ABNORMAL LOW (ref 4.22–5.81)
Retic Count, Absolute: 34 10*3/uL (ref 19.0–186.0)
Retic Ct Pct: 1.4 % (ref 0.4–3.1)

## 2020-06-18 LAB — IRON AND TIBC
Iron: 65 ug/dL (ref 45–182)
Saturation Ratios: 30 % (ref 17.9–39.5)
TIBC: 217 ug/dL — ABNORMAL LOW (ref 250–450)
UIBC: 152 ug/dL

## 2020-06-18 LAB — TSH: TSH: 2.113 u[IU]/mL (ref 0.350–4.500)

## 2020-06-18 LAB — FERRITIN: Ferritin: 129 ng/mL (ref 24–336)

## 2020-06-18 LAB — FOLATE: Folate: 5.7 ng/mL — ABNORMAL LOW (ref >5.9)

## 2020-06-18 LAB — VITAMIN B12: Vitamin B-12: 207 pg/mL (ref 180–914)

## 2020-06-18 MED ORDER — FOLIC ACID 1 MG PO TABS: 2.0000 mg | ORAL_TABLET | Freq: Every day | ORAL | 1 refills | Status: DC

## 2020-06-18 NOTE — Telephone Encounter (Signed)
Let pt know results and that folate will be sent to the pharmacy.SJC

## 2020-06-18 NOTE — Progress Notes (Addendum)
Hematology/Oncology Consult note [facility] Telephone:([phone] [phone]  Patient Care Team: [name], MD as PCP - General (Family Medicine) Ubaldo Glassing Javier Docker, MD as Consulting Physician (Cardiology) Isaias Cowman, MD as Consulting Physician (Cardiology)   Name of the patient: Casey French  [id number]  1950-04-03    Reason for referral-Dr. 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  He follows up with Dr. Zollie Scale.  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  Patient did not want to take steroids due to his diabetes.  He has been referred for consideration of Rituxan infusion from nephrology.  Patient is here with his wife and daughter today.  He has ongoing fatigue.  Denies any other specific complaints at this time  ECOG PS- 1  Pain scale- 0   Review of systems- Review of Systems  Constitutional: Negative for chills, fever, malaise/fatigue and weight loss.  HENT: Negative for congestion, ear discharge and nosebleeds.   Eyes: Negative for blurred vision.  Respiratory: Negative for cough, hemoptysis, sputum production, shortness of breath and wheezing.   Cardiovascular: Negative for chest pain, palpitations, orthopnea and claudication.  Gastrointestinal: Negative for abdominal pain, blood in stool, constipation, diarrhea, heartburn, melena, nausea and vomiting.  Genitourinary: Negative for dysuria, flank pain, frequency, hematuria and urgency.  Musculoskeletal: Negative for back pain, joint pain and myalgias.  Skin: Negative for rash.  Neurological: Negative for dizziness, tingling, focal weakness, seizures, weakness  and headaches.  Endo/Heme/Allergies: Does not bruise/bleed easily.  Psychiatric/Behavioral: Negative for depression and suicidal ideas. The patient does not have insomnia.     Allergies  Allergen Reactions  . Morphine Anxiety    agitation     Patient Active Problem List   Diagnosis Date Noted  . S/P hip replacement 04/22/2020  . Hematemesis 05/15/2019  . Hematochezia 05/15/2019  . Chronic anticoagulation 05/15/2019  . Atrial fibrillation, chronic ([place]) 05/15/2019  . GI bleed 05/15/2019  . Chronic kidney disease 05/15/2019  . Unstable angina ([place]) 11/10/2016  . Angina at rest [facility]) 05/01/2015  . Hypertensive urgency 06/14/2014  . Acute diverticulitis 06/14/2014  . AKI (acute kidney injury) ([place]) 06/14/2014  . Benign essential HTN 06/14/2014  . Hyperlipidemia 06/14/2014  . DM type 2 (diabetes mellitus, type 2) ([place]) 06/14/2014  . CAD (coronary artery disease) 06/14/2014  . GERD without esophagitis 06/14/2014  . Closed head injury 03/24/2013     Past Medical History:  Diagnosis Date  . Anemia   . Anginal pain ([place])   . Aortic atherosclerosis ([place])   . Atrial fibrillation ([place])   . CKD (chronic kidney disease) stage 3, GFR 30-59 ml/min (HCC)   . Coronary artery disease   . Depression   . Diabetes mellitus without complication ([place])   . Diverticulosis   . GERD (gastroesophageal reflux disease)   . Gout   . Heart murmur   . High cholesterol   . Hypertension   . STEMI (ST elevation myocardial infarction) [facility]) 2003   Inferior     Past Surgical History:  Procedure Laterality Date  . CARDIAC CATHETERIZATION    . CATARACT EXTRACTION W/PHACO Right 07/05/2019   Procedure: CATARACT EXTRACTION PHACO AND INTRAOCULAR LENS PLACEMENT ([place]) RIGHT DIABETIC;  Surgeon: Birder Robson, MD;  Location: [facility] ORS;  Service: Ophthalmology;  Laterality: Right;  US00:59.3 CDE8.41 LOT [id number]  . COLONOSCOPY WITH PROPOFOL N/A 04/01/2017   Procedure: COLONOSCOPY WITH PROPOFOL;   Surgeon: Lollie Sails, MD;  Location: [facility] ENDOSCOPY;  Service: Endoscopy;  Laterality: N/A;  . CORONARY ANGIOPLASTY WITH STENT PLACEMENT  10/2001   RCA  . CORONARY ANGIOPLASTY WITH STENT PLACEMENT  07/2015   LAD  . right hand surgery  2015   3 screws in right hand  . TOTAL HIP ARTHROPLASTY Left 04/22/2020   Procedure: TOTAL HIP ARTHROPLASTY ANTERIOR APPROACH;  Surgeon: Hessie Knows, MD;  Location: [facility] ORS;  Service: Orthopedics;  Laterality: Left;    Social History   Socioeconomic History  . Marital status: Married    Spouse name: Sherri  . Number of children: 2  . Years of education: Not on file  . Highest education level: Not on file  Occupational History  . Not on file  Tobacco Use  . Smoking status: Former Smoker    Types: Cigarettes    Quit date: 03/24/2001    Years since quitting: 19.2  . Smokeless tobacco: Never Used  Vaping Use  . Vaping Use: Never used  Substance and Sexual Activity  . Alcohol use: No    Alcohol/week: 0.0 standard drinks  . Drug use: No  . Sexual activity: Not on file  Other Topics Concern  . Not on file  Social History Narrative  . Not on file   Social Determinants of Health   Financial Resource Strain: Not on file  Food Insecurity: Not on file  Transportation Needs: Not on file  Physical Activity: Not on file  Stress: Not on file  Social Connections: Not on file  Intimate Partner Violence: Not on file     Family History  Problem Relation Age of Onset  . CAD Other      Current Outpatient Medications:  .  allopurinol (ZYLOPRIM) 100 MG tablet, Take 100 mg by mouth daily., Disp: , Rfl:  .  amLODipine (NORVASC) 10 MG tablet, Take 10 mg by mouth daily., Disp: , Rfl:  .  apixaban (ELIQUIS) 2.5 MG TABS tablet, Take 2.5 mg by mouth daily., Disp: , Rfl:  .  aspirin 81 MG EC tablet, Take 81 mg by mouth daily., Disp: , Rfl:  .  cloNIDine (CATAPRES) 0.1 MG tablet, Take 1 tablet (0.1 mg total) by mouth 2 (two) times daily., Disp: 60  tablet, Rfl: 0 .  fexofenadine (ALLEGRA) 180 MG tablet, Take 180 mg by mouth daily as needed for allergies or rhinitis., Disp: , Rfl:  .  fluticasone (FLONASE) 50 MCG/ACT nasal spray, Place 2 sprays into both nostrils daily as needed for allergies., Disp: , Rfl:  .  gabapentin (NEURONTIN) 300 MG capsule, Take 300 mg by mouth at bedtime., Disp: , Rfl: 11 .  HYDROcodone-acetaminophen (NORCO/VICODIN) 5-325 MG tablet, Take 1-2 tablets by mouth every 4 (four) hours as needed for moderate pain (pain score 4-6)., Disp: 30 tablet, Rfl: 0 .  hydrocortisone (ANUSOL-HC) 2.5 % rectal cream, Place 1 application rectally 2 (two) times daily as needed for hemorrhoids or anal itching., Disp: , Rfl:  .  isosorbide mononitrate (IMDUR) 60 MG 24 hr tablet, Take 60 mg by mouth daily., Disp: , Rfl:  .  lisinopril (ZESTRIL) 20 MG tablet, Take 20 mg by mouth daily., Disp: , Rfl:  .  methocarbamol (ROBAXIN) 500 MG tablet, Take 1 tablet (500 mg total) by mouth every 6 (six) hours as needed for muscle  spasms., Disp: 20 tablet, Rfl: 0 .  metoprolol succinate (TOPROL-XL) 100 MG 24 hr tablet, Take 150 mg by mouth daily. Take with or immediately following a meal., Disp: , Rfl:  .  nitroGLYCERIN (NITROSTAT) 0.4 MG SL tablet, Place 0.4 mg under the tongue every 5 (five) minutes as needed for chest pain. (Patient not taking: Reported on 04/22/2020), Disp: , Rfl:  .  omeprazole (PRILOSEC) 20 MG capsule, Take 20 mg by mouth daily., Disp: , Rfl:  .  psyllium (METAMUCIL) 58.6 % packet, Take 1 packet by mouth 2 (two) times daily., Disp: , Rfl:  .  rosuvastatin (CRESTOR) 40 MG tablet, Take 40 mg by mouth daily., Disp: , Rfl:  .  torsemide (DEMADEX) 10 MG tablet, Take 1 tablet (10 mg total) by mouth daily. Hold this medication until you see your nephrologist, Disp: , Rfl:  .  traMADol (ULTRAM) 50 MG tablet, Take 1 tablet (50 mg total) by mouth every 6 (six) hours., Disp: 30 tablet, Rfl: 0   Physical exam: There were no vitals filed for  this visit. Physical Exam HENT:     Head: Normocephalic and atraumatic.  Eyes:     Pupils: Pupils are equal, round, and reactive to light.  Cardiovascular:     Rate and Rhythm: Normal rate and regular rhythm.     Heart sounds: Normal heart sounds.  Pulmonary:     Effort: Pulmonary effort is normal.     Breath sounds: Normal breath sounds.  Abdominal:     General: Bowel sounds are normal.     Palpations: Abdomen is soft.  Musculoskeletal:     Cervical back: Normal range of motion.  Skin:    General: Skin is warm and dry.  Neurological:     Mental Status: He is alert and oriented to person, place, and time.        CMP Latest Ref Rng & Units 04/26/2020  Glucose 70 - 99 mg/dL -  BUN 8 - 23 mg/dL -  Creatinine 0.61 - 1.24 mg/dL 4.10(H)  Sodium 135 - 145 mmol/L -  Potassium 3.5 - 5.1 mmol/L -  Chloride 98 - 111 mmol/L -  CO2 22 - 32 mmol/L -  Calcium 8.9 - 10.3 mg/dL -  Total Protein 6.5 - 8.1 g/dL -  Total Bilirubin 0.3 - 1.2 mg/dL -  Alkaline Phos 38 - 126 U/L -  AST 15 - 41 U/L -  ALT 0 - 44 U/L -   CBC Latest Ref Rng & Units 04/26/2020  WBC 4.0 - 10.5 K/uL 6.6  Hemoglobin 13.0 - 17.0 g/dL 7.8(L)  Hematocrit 39.0 - 52.0 % 23.9(L)  Platelets 150 - 400 K/uL 194     Assessment and plan- Patient is a 70 y.o. 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  FSGS: Schedule and premeds for Rituxan to be decided by nephrology.  I will await orders and schedule for Rituxan from Dr. Assunta Gambles office before proceeding with Rituxan.  He should hopefully be able to start it next week.  Baseline hepatitis B and C testing was negative.  Discussed risks and benefits of Rituxan including all but not limited to possible risk of infections, low blood counts, risk of infusion reaction.  Patient understands and agrees to proceed as planned.  Anemia of chronic kidney disease: We will check CBC ferritin  and iron studies CMP B12.  TSH reticulocyte count haptoglobin and myeloma panel today.  I will see him in 2 to 3 weeks time for a video visit to discuss the results of blood work  I have also given him information about Evushield for COVID prophylaxis given that he will be immunocompromised secondary to rituximab.  We will make a referral for that as well  Thank you for this kind referral and the opportunity to participate in the care of this patient   Visit Diagnosis 1. FSGS (focal segmental glomerulosclerosis)   2. Anemia of chronic kidney failure, stage 4 (severe) ([place])   3. Encounter for monitoring rituximab therapy     Dr. Randa Evens, MD, MPH [facility] at [facility] [id number] 06/18/2020  10:30 AM

## 2020-06-18 NOTE — Progress Notes (Signed)
Per Nephrology, prescription for Rituxan '375mg'$ /m2 weekly for four doses for glomerulonephritis. Hep B core, and surface antigen negative per outside facility on 06/03/20.

## 2020-06-19 ENCOUNTER — Telehealth: Payer: Self-pay | Admitting: *Deleted

## 2020-06-19 LAB — HAPTOGLOBIN: Haptoglobin: 287 mg/dL (ref 32–363)

## 2020-06-19 NOTE — Telephone Encounter (Signed)
Anderson Malta from Dr Elwyn Lade office called Judeen Hammans back regarding this patient and pre medications for Rituxan. She said that she called Desiree with Dr Juleen China and said to use what ever our protocol is for premeds to use that. If you have questions, call Desiree at [phone] ext [phone]

## 2020-06-20 ENCOUNTER — Encounter: Payer: Self-pay | Admitting: Oncology

## 2020-06-20 DIAGNOSIS — N051 Unspecified nephritic syndrome with focal and segmental glomerular lesions: Secondary | ICD-10-CM | POA: Insufficient documentation

## 2020-06-20 NOTE — Progress Notes (Signed)
Called pt about the b12 level and he would like shots in cancer center, he was already aware of folate level and folic acid was sent to pharmacy and he is already taking it. We now have all we need to get pt set up for rituxan and he will get an appt for next week to get scheduled and he is ok with this

## 2020-06-20 NOTE — Addendum Note (Signed)
Addended by: Randa Evens C on: 06/20/2020 02:15 PM   Modules accepted: Orders, Level of Service

## 2020-06-24 LAB — MULTIPLE MYELOMA PANEL, SERUM
Albumin SerPl Elph-Mcnc: 2.2 g/dL — ABNORMAL LOW (ref 2.9–4.4)
Albumin/Glob SerPl: 0.9 (ref 0.7–1.7)
Alpha 1: 0.3 g/dL (ref 0.0–0.4)
Alpha2 Glob SerPl Elph-Mcnc: 0.8 g/dL (ref 0.4–1.0)
B-Globulin SerPl Elph-Mcnc: 0.7 g/dL (ref 0.7–1.3)
Gamma Glob SerPl Elph-Mcnc: 0.7 g/dL (ref 0.4–1.8)
Globulin, Total: 2.5 g/dL (ref 2.2–3.9)
IgA: 268 mg/dL (ref 61–437)
IgG (Immunoglobin G), Serum: 598 mg/dL — ABNORMAL LOW (ref 603–1613)
IgM (Immunoglobulin M), Srm: 220 mg/dL — ABNORMAL HIGH (ref 20–172)
Total Protein ELP: 4.7 g/dL — ABNORMAL LOW (ref 6.0–8.5)

## 2020-06-25 ENCOUNTER — Telehealth: Payer: Self-pay | Admitting: Oncology

## 2020-06-25 ENCOUNTER — Telehealth: Payer: Self-pay | Admitting: Adult Health

## 2020-06-25 NOTE — Telephone Encounter (Signed)
Spoke with patient to get him scheduled for Rituxan weekly x 4. Patient is agreeable to start this Friday and is aware that the following treatment will need to be in [place].

## 2020-06-25 NOTE — Telephone Encounter (Signed)
I called patient to discuss Evusheld, a long acting monoclonal antibody injection administered to patients with decreased immune systems or intolerance/allergy to the COVID 19 vaccine as COVID19 prevention.    Unable to reach patient.  LMOM to return my call.  Greig Altergott, NP  

## 2020-06-27 ENCOUNTER — Inpatient Hospital Stay: Payer: Medicare PPO | Attending: Internal Medicine

## 2020-06-27 ENCOUNTER — Other Ambulatory Visit: Payer: Self-pay

## 2020-06-27 VITALS — BP 152/82 | HR 89 | Temp 97.1°F | Resp 18

## 2020-06-27 DIAGNOSIS — I10 Essential (primary) hypertension: Secondary | ICD-10-CM | POA: Insufficient documentation

## 2020-06-27 DIAGNOSIS — D631 Anemia in chronic kidney disease: Secondary | ICD-10-CM | POA: Diagnosis not present

## 2020-06-27 DIAGNOSIS — E119 Type 2 diabetes mellitus without complications: Secondary | ICD-10-CM | POA: Diagnosis not present

## 2020-06-27 DIAGNOSIS — N031 Chronic nephritic syndrome with focal and segmental glomerular lesions: Secondary | ICD-10-CM | POA: Insufficient documentation

## 2020-06-27 DIAGNOSIS — N184 Chronic kidney disease, stage 4 (severe): Secondary | ICD-10-CM | POA: Diagnosis not present

## 2020-06-27 DIAGNOSIS — Z7901 Long term (current) use of anticoagulants: Secondary | ICD-10-CM | POA: Insufficient documentation

## 2020-06-27 DIAGNOSIS — Z87891 Personal history of nicotine dependence: Secondary | ICD-10-CM | POA: Insufficient documentation

## 2020-06-27 DIAGNOSIS — Z79899 Other long term (current) drug therapy: Secondary | ICD-10-CM | POA: Diagnosis not present

## 2020-06-27 DIAGNOSIS — N051 Unspecified nephritic syndrome with focal and segmental glomerular lesions: Secondary | ICD-10-CM

## 2020-06-27 DIAGNOSIS — E538 Deficiency of other specified B group vitamins: Secondary | ICD-10-CM | POA: Insufficient documentation

## 2020-06-27 DIAGNOSIS — Z7982 Long term (current) use of aspirin: Secondary | ICD-10-CM | POA: Diagnosis not present

## 2020-06-27 DIAGNOSIS — I4891 Unspecified atrial fibrillation: Secondary | ICD-10-CM | POA: Insufficient documentation

## 2020-06-27 MED ORDER — SODIUM CHLORIDE 0.9 % IV SOLN
375.0000 mg/m2 | Freq: Once | INTRAVENOUS | Status: AC
  Administered 2020-06-27: 700 mg via INTRAVENOUS
  Filled 2020-06-27: qty 20

## 2020-06-27 MED ORDER — CYANOCOBALAMIN 1000 MCG/ML IJ SOLN
1000.0000 ug | Freq: Once | INTRAMUSCULAR | Status: AC
  Administered 2020-06-27: 1000 ug via INTRAMUSCULAR
  Filled 2020-06-27: qty 1

## 2020-06-27 MED ORDER — SODIUM CHLORIDE 0.9 % IV SOLN
Freq: Once | INTRAVENOUS | Status: AC
  Filled 2020-06-27: qty 250

## 2020-06-27 MED ORDER — ACETAMINOPHEN 325 MG PO TABS
650.0000 mg | ORAL_TABLET | Freq: Once | ORAL | Status: AC
Start: 2020-06-27 — End: 2020-06-27
  Administered 2020-06-27: 650 mg via ORAL
  Filled 2020-06-27: qty 2

## 2020-06-27 MED ORDER — DIPHENHYDRAMINE HCL 25 MG PO CAPS
50.0000 mg | ORAL_CAPSULE | Freq: Once | ORAL | Status: AC
  Administered 2020-06-27: 50 mg via ORAL
  Filled 2020-06-27: qty 2

## 2020-06-27 NOTE — Patient Instructions (Signed)
[place]  Discharge Instructions: Thank you for choosing [facility] to provide your oncology and hematology care.  If you have a lab appointment with the [facility], please go directly to the [facility] and check in at the registration area.  Wear comfortable clothing and clothing appropriate for easy access to any Portacath or PICC line.   We strive to give you quality time with your provider. You may need to reschedule your appointment if you arrive late (15 or more minutes).  Arriving late affects you and other patients whose appointments are after yours.  Also, if you miss three or more appointments without notifying the office, you may be dismissed from the clinic at the provider's discretion.      For prescription refill requests, have your pharmacy contact our office and allow 72 hours for refills to be completed.    Today you received the following chemotherapy and/or immunotherapy agents Rituxan       To help prevent nausea and vomiting after your treatment, we encourage you to take your nausea medication as directed.  BELOW ARE SYMPTOMS THAT SHOULD BE REPORTED IMMEDIATELY: . *FEVER GREATER THAN 100.4 F (38 C) OR HIGHER . *CHILLS OR SWEATING . *NAUSEA AND VOMITING THAT IS NOT CONTROLLED WITH YOUR NAUSEA MEDICATION . *UNUSUAL SHORTNESS OF BREATH . *UNUSUAL BRUISING OR BLEEDING . *URINARY PROBLEMS (pain or burning when urinating, or frequent urination) . *BOWEL PROBLEMS (unusual diarrhea, constipation, pain near the anus) . TENDERNESS IN MOUTH AND THROAT WITH OR WITHOUT PRESENCE OF ULCERS (sore throat, sores in mouth, or a toothache) . UNUSUAL RASH, SWELLING OR PAIN  . UNUSUAL VAGINAL DISCHARGE OR ITCHING   Items with * indicate a potential emergency and should be followed up as soon as possible or go to the Emergency Department if any problems should occur.  Please show the CHEMOTHERAPY ALERT CARD or IMMUNOTHERAPY ALERT CARD at  check-in to the Emergency Department and triage nurse.  Should you have questions after your visit or need to cancel or reschedule your appointment, please contact [facility]  [phone] and follow the prompts.  Office hours are 8:00 a.m. to 4:30 p.m. Monday - Friday. Please note that voicemails left after 4:00 p.m. may not be returned until the following business day.  We are closed weekends and major holidays. You have access to a nurse at all times for urgent questions. Please call the main number to the clinic [phone] and follow the prompts.  For any non-urgent questions, you may also contact your provider using MyChart. We now offer e-Visits for anyone 73 and older to request care online for non-urgent symptoms. For details visit [email or web address].   Also download the MyChart app! Go to the app store, search "MyChart", open the app, select [place], and log in with your MyChart username and password.  Due to Covid, a mask is required upon entering the hospital/clinic. If you do not have a mask, one will be given to you upon arrival. For doctor visits, patients may have 1 support person aged 38 or older with them. For treatment visits, patients cannot have anyone with them due to current Covid guidelines and our immunocompromised population.

## 2020-07-04 ENCOUNTER — Inpatient Hospital Stay: Payer: Medicare PPO

## 2020-07-04 VITALS — BP 107/63 | HR 67 | Wt 157.0 lb

## 2020-07-04 DIAGNOSIS — N031 Chronic nephritic syndrome with focal and segmental glomerular lesions: Secondary | ICD-10-CM | POA: Diagnosis not present

## 2020-07-04 DIAGNOSIS — N051 Unspecified nephritic syndrome with focal and segmental glomerular lesions: Secondary | ICD-10-CM

## 2020-07-04 LAB — CBC WITH DIFFERENTIAL/PLATELET
Abs Immature Granulocytes: 0.05 10*3/uL (ref 0.00–0.07)
Basophils Absolute: 0 10*3/uL (ref 0.0–0.1)
Basophils Relative: 1 %
Eosinophils Absolute: 0.1 10*3/uL (ref 0.0–0.5)
Eosinophils Relative: 2 %
HCT: 21.9 % — ABNORMAL LOW (ref 39.0–52.0)
Hemoglobin: 7.2 g/dL — ABNORMAL LOW (ref 13.0–17.0)
Immature Granulocytes: 1 %
Lymphocytes Relative: 18 %
Lymphs Abs: 1.3 10*3/uL (ref 0.7–4.0)
MCH: 31.2 pg (ref 26.0–34.0)
MCHC: 32.9 g/dL (ref 30.0–36.0)
MCV: 94.8 fL (ref 80.0–100.0)
Monocytes Absolute: 0.8 10*3/uL (ref 0.1–1.0)
Monocytes Relative: 11 %
Neutro Abs: 4.9 10*3/uL (ref 1.7–7.7)
Neutrophils Relative %: 67 %
Platelets: 190 10*3/uL (ref 150–400)
RBC: 2.31 MIL/uL — ABNORMAL LOW (ref 4.22–5.81)
RDW: 15.9 % — ABNORMAL HIGH (ref 11.5–15.5)
WBC: 7.2 10*3/uL (ref 4.0–10.5)
nRBC: 0 % (ref 0.0–0.2)

## 2020-07-04 MED ORDER — RITUXIMAB-PVVR CHEMO 500 MG/50ML IV SOLN: 375.0000 mg/m2 | Freq: Once | INTRAVENOUS | Status: DC

## 2020-07-04 MED ORDER — SODIUM CHLORIDE 0.9 % IV SOLN
Freq: Once | INTRAVENOUS | Status: AC
  Filled 2020-07-04: qty 250

## 2020-07-04 MED ORDER — DIPHENHYDRAMINE HCL 25 MG PO CAPS
50.0000 mg | ORAL_CAPSULE | Freq: Once | ORAL | Status: AC
Start: 2020-07-04 — End: 2020-07-04
  Administered 2020-07-04: 50 mg via ORAL
  Filled 2020-07-04: qty 2

## 2020-07-04 MED ORDER — ACETAMINOPHEN 325 MG PO TABS
650.0000 mg | ORAL_TABLET | Freq: Once | ORAL | Status: AC
  Administered 2020-07-04: 650 mg via ORAL
  Filled 2020-07-04: qty 2

## 2020-07-04 MED ORDER — SODIUM CHLORIDE 0.9 % IV SOLN
375.0000 mg/m2 | Freq: Once | INTRAVENOUS | Status: AC
  Administered 2020-07-04: 700 mg via INTRAVENOUS
  Filled 2020-07-04: qty 10

## 2020-07-04 NOTE — Patient Instructions (Signed)
[facility] ONCOLOGY  Discharge Instructions: Thank you for choosing [facility] to provide your oncology and hematology care.  If you have a lab appointment with the [facility], please go directly to the [facility] and check in at the registration area.  Wear comfortable clothing and clothing appropriate for easy access to any Portacath or PICC line.   We strive to give you quality time with your provider. You may need to reschedule your appointment if you arrive late (15 or more minutes).  Arriving late affects you and other patients whose appointments are after yours.  Also, if you miss three or more appointments without notifying the office, you may be dismissed from the clinic at the provider's discretion.      For prescription refill requests, have your pharmacy contact our office and allow 72 hours for refills to be completed.    Today you received the following chemotherapy and/or immunotherapy agents Rituxan      To help prevent nausea and vomiting after your treatment, we encourage you to take your nausea medication as directed.  BELOW ARE SYMPTOMS THAT SHOULD BE REPORTED IMMEDIATELY: *FEVER GREATER THAN 100.4 F (38 C) OR HIGHER *CHILLS OR SWEATING *NAUSEA AND VOMITING THAT IS NOT CONTROLLED WITH YOUR NAUSEA MEDICATION *UNUSUAL SHORTNESS OF BREATH *UNUSUAL BRUISING OR BLEEDING *URINARY PROBLEMS (pain or burning when urinating, or frequent urination) *BOWEL PROBLEMS (unusual diarrhea, constipation, pain near the anus) TENDERNESS IN MOUTH AND THROAT WITH OR WITHOUT PRESENCE OF ULCERS (sore throat, sores in mouth, or a toothache) UNUSUAL RASH, SWELLING OR PAIN  UNUSUAL VAGINAL DISCHARGE OR ITCHING   Items with * indicate a potential emergency and should be followed up as soon as possible or go to the Emergency Department if any problems should occur.  Please show the CHEMOTHERAPY ALERT CARD or IMMUNOTHERAPY ALERT CARD at check-in to  the Emergency Department and triage nurse.  Should you have questions after your visit or need to cancel or reschedule your appointment, please contact [facility]  [phone] and follow the prompts.  Office hours are 8:00 a.m. to 4:30 p.m. Monday - Friday. Please note that voicemails left after 4:00 p.m. may not be returned until the following business day.  We are closed weekends and major holidays. You have access to a nurse at all times for urgent questions. Please call the main number to the clinic [phone] and follow the prompts.  For any non-urgent questions, you may also contact your provider using MyChart. We now offer e-Visits for anyone 47 and older to request care online for non-urgent symptoms. For details visit [email or web address].   Also download the MyChart app! Go to the app store, search "MyChart", open the app, select [place], and log in with your MyChart username and password.  Due to Covid, a mask is required upon entering the hospital/clinic. If you do not have a mask, one will be given to you upon arrival. For doctor visits, patients may have 1 support person aged 71 or older with them. For treatment visits, patients cannot have anyone with them due to current Covid guidelines and our immunocompromised population.

## 2020-07-10 ENCOUNTER — Inpatient Hospital Stay (HOSPITAL_BASED_OUTPATIENT_CLINIC_OR_DEPARTMENT_OTHER): Payer: Medicare PPO | Admitting: Oncology

## 2020-07-10 DIAGNOSIS — N184 Chronic kidney disease, stage 4 (severe): Secondary | ICD-10-CM

## 2020-07-10 DIAGNOSIS — D631 Anemia in chronic kidney disease: Secondary | ICD-10-CM | POA: Diagnosis not present

## 2020-07-10 DIAGNOSIS — E538 Deficiency of other specified B group vitamins: Secondary | ICD-10-CM | POA: Diagnosis not present

## 2020-07-10 NOTE — Progress Notes (Signed)
Pt doing good ? ?

## 2020-07-11 ENCOUNTER — Other Ambulatory Visit: Payer: Self-pay

## 2020-07-11 ENCOUNTER — Inpatient Hospital Stay: Payer: Medicare PPO

## 2020-07-11 ENCOUNTER — Encounter: Payer: Self-pay | Admitting: Oncology

## 2020-07-11 VITALS — BP 135/72 | HR 71 | Wt 152.4 lb

## 2020-07-11 DIAGNOSIS — N051 Unspecified nephritic syndrome with focal and segmental glomerular lesions: Secondary | ICD-10-CM

## 2020-07-11 DIAGNOSIS — E538 Deficiency of other specified B group vitamins: Secondary | ICD-10-CM

## 2020-07-11 DIAGNOSIS — N031 Chronic nephritic syndrome with focal and segmental glomerular lesions: Secondary | ICD-10-CM | POA: Diagnosis not present

## 2020-07-11 MED ORDER — SODIUM CHLORIDE 0.9 % IV SOLN
375.0000 mg/m2 | Freq: Once | INTRAVENOUS | Status: AC
Start: 2020-07-11 — End: 2020-07-11
  Administered 2020-07-11: 700 mg via INTRAVENOUS
  Filled 2020-07-11: qty 50

## 2020-07-11 MED ORDER — RITUXIMAB-PVVR CHEMO 500 MG/50ML IV SOLN: 375.0000 mg/m2 | Freq: Once | INTRAVENOUS | Status: DC

## 2020-07-11 MED ORDER — SODIUM CHLORIDE 0.9 % IV SOLN: 200.0000 mg | INTRAVENOUS | Status: DC

## 2020-07-11 MED ORDER — IRON SUCROSE 20 MG/ML IV SOLN
200.0000 mg | Freq: Once | INTRAVENOUS | Status: AC
  Administered 2020-07-11: 200 mg via INTRAVENOUS
  Filled 2020-07-11: qty 10

## 2020-07-11 MED ORDER — SODIUM CHLORIDE 0.9 % IV SOLN
Freq: Once | INTRAVENOUS | Status: AC
  Filled 2020-07-11: qty 250

## 2020-07-11 MED ORDER — ACETAMINOPHEN 325 MG PO TABS
650.0000 mg | ORAL_TABLET | Freq: Once | ORAL | Status: AC
  Administered 2020-07-11: 650 mg via ORAL
  Filled 2020-07-11: qty 2

## 2020-07-11 MED ORDER — SODIUM CHLORIDE 0.9 % IV SOLN: 375.0000 mg/m2 | Freq: Once | INTRAVENOUS | Status: DC

## 2020-07-11 MED ORDER — DIPHENHYDRAMINE HCL 25 MG PO CAPS
50.0000 mg | ORAL_CAPSULE | Freq: Once | ORAL | Status: AC
  Administered 2020-07-11: 50 mg via ORAL
  Filled 2020-07-11: qty 2

## 2020-07-11 MED ORDER — CYANOCOBALAMIN 1000 MCG/ML IJ SOLN
1000.0000 ug | INTRAMUSCULAR | Status: DC
  Administered 2020-07-11: 1000 ug via INTRAMUSCULAR

## 2020-07-11 NOTE — Patient Instructions (Signed)
[facility]  Discharge Instructions: Thank you for choosing [facility] to provide your oncology and hematology care.  If you have a lab appointment with the [facility], please go directly to the [facility] and check in at the registration area.  Wear comfortable clothing and clothing appropriate for easy access to any Portacath or PICC line.   We strive to give you quality time with your provider. You may need to reschedule your appointment if you arrive late (15 or more minutes).  Arriving late affects you and other patients whose appointments are after yours.  Also, if you miss three or more appointments without notifying the office, you may be dismissed from the clinic at the provider's discretion.      For prescription refill requests, have your pharmacy contact our office and allow 72 hours for refills to be completed.    Today you received the following chemotherapy and/or immunotherapy agents Rituxan      To help prevent nausea and vomiting after your treatment, we encourage you to take your nausea medication as directed.  BELOW ARE SYMPTOMS THAT SHOULD BE REPORTED IMMEDIATELY: *FEVER GREATER THAN 100.4 F (38 C) OR HIGHER *CHILLS OR SWEATING *NAUSEA AND VOMITING THAT IS NOT CONTROLLED WITH YOUR NAUSEA MEDICATION *UNUSUAL SHORTNESS OF BREATH *UNUSUAL BRUISING OR BLEEDING *URINARY PROBLEMS (pain or burning when urinating, or frequent urination) *BOWEL PROBLEMS (unusual diarrhea, constipation, pain near the anus) TENDERNESS IN MOUTH AND THROAT WITH OR WITHOUT PRESENCE OF ULCERS (sore throat, sores in mouth, or a toothache) UNUSUAL RASH, SWELLING OR PAIN  UNUSUAL VAGINAL DISCHARGE OR ITCHING   Items with * indicate a potential emergency and should be followed up as soon as possible or go to the Emergency Department if any problems should occur.  Please show the CHEMOTHERAPY ALERT CARD or IMMUNOTHERAPY ALERT CARD at check-in to the  Emergency Department and triage nurse.  Should you have questions after your visit or need to cancel or reschedule your appointment, please contact [facility]  [phone] and follow the prompts.  Office hours are 8:00 a.m. to 4:30 p.m. Monday - Friday. Please note that voicemails left after 4:00 p.m. may not be returned until the following business day.  We are closed weekends and major holidays. You have access to a nurse at all times for urgent questions. Please call the main number to the clinic [phone] and follow the prompts.  For any non-urgent questions, you may also contact your provider using MyChart. We now offer e-Visits for anyone 60 and older to request care online for non-urgent symptoms. For details visit [email or web address].   Also download the MyChart app! Go to the app store, search "MyChart", open the app, select [place], and log in with your MyChart username and password.  Due to Covid, a mask is required upon entering the hospital/clinic. If you do not have a mask, one will be given to you upon arrival. For doctor visits, patients may have 1 support person aged 69 or older with them. For treatment visits, patients cannot have anyone with them due to current Covid guidelines and our immunocompromised population.

## 2020-07-13 ENCOUNTER — Encounter: Payer: Self-pay | Admitting: Oncology

## 2020-07-13 NOTE — Progress Notes (Signed)
I connected with Casey French on 07/13/20 at  1:00 PM EDT by video enabled telemedicine visit and verified that I am speaking with the correct person using two identifiers.   I discussed the limitations, risks, security and privacy concerns of performing an evaluation and management service by telemedicine and the availability of in-person appointments. I also discussed with the patient that there may be a patient responsible charge related to this service. The patient expressed understanding and agreed to proceed.  Other persons participating in the visit and their role in the encounter:  Patients wife  Patient's location:  home Provider's location:  work  Risk analyst Complaint:  discuss results of bloodwork  History of present illness: Patient is a 70 year old male with a past medical history significant for stage IV CKD hypertension hyperlipidemia type 2 diabetes and atrial fibrillation.  He follows up with Dr. Zollie Scale.  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  Patient did not want to take steroids due to his diabetes.  He is on rituxan for FSGS  He was also referred to [place] for anemia and results of anemia work-up were as follows: White count and platelets were normal and H&H was 7.8/23.9.  Folate levels were low at 5.7, B12 low at 207.  Ferritin levels at 129 and iron studies showed low TIBC of 217 with an iron saturation of 30%.  Myeloma panel did not show any M protein.  Haptoglobin was normal and TSH was normal.     Interval history patient reports feeling at his baseline state of health.  He is ongoing fatigue.  Denies other complaints   Review of Systems  Constitutional:  Positive for malaise/fatigue. Negative for chills, fever and weight loss.  HENT:  Negative for congestion, ear discharge and nosebleeds.   Eyes:  Negative for blurred vision.  Respiratory:  Negative for cough, hemoptysis, sputum production, shortness of  breath and wheezing.   Cardiovascular:  Negative for chest pain, palpitations, orthopnea and claudication.  Gastrointestinal:  Negative for abdominal pain, blood in stool, constipation, diarrhea, heartburn, melena, nausea and vomiting.  Genitourinary:  Negative for dysuria, flank pain, frequency, hematuria and urgency.  Musculoskeletal:  Negative for back pain, joint pain and myalgias.  Skin:  Negative for rash.  Neurological:  Negative for dizziness, tingling, focal weakness, seizures, weakness and headaches.  Endo/Heme/Allergies:  Does not bruise/bleed easily.  Psychiatric/Behavioral:  Negative for depression and suicidal ideas. The patient does not have insomnia.    Allergies  Allergen Reactions   Morphine Anxiety    agitation     Past Medical History:  Diagnosis Date   Anemia    Anginal pain (HCC)    Aortic atherosclerosis (HCC)    Atrial fibrillation (HCC)    CKD (chronic kidney disease) stage 3, GFR 30-59 ml/min (HCC)    Coronary artery disease    Depression    Diabetes mellitus without complication (HCC)    Diverticulosis    GERD (gastroesophageal reflux disease)    Gout    Heart murmur    High cholesterol    Hypertension    STEMI (ST elevation myocardial infarction) ([place]) 2003   Inferior    Past Surgical History:  Procedure Laterality Date   CARDIAC CATHETERIZATION     CATARACT EXTRACTION W/PHACO Right 07/05/2019   Procedure: CATARACT EXTRACTION PHACO AND INTRAOCULAR LENS PLACEMENT ([place]) RIGHT DIABETIC;  Surgeon: Birder Robson, MD;  Location: [facility] ORS;  Service: Ophthalmology;  Laterality: Right;  US00:59.3 CDE8.41 LOT  [id number]   COLONOSCOPY WITH PROPOFOL N/A 04/01/2017   Procedure: COLONOSCOPY WITH PROPOFOL;  Surgeon: Lollie Sails, MD;  Location: [facility]COPY;  Service: Endoscopy;  Laterality: N/A;   CORONARY ANGIOPLASTY WITH STENT PLACEMENT  10/2001   RCA   CORONARY ANGIOPLASTY WITH STENT PLACEMENT  07/2015   LAD   right hand surgery  2015   3  screws in right hand   TOTAL HIP ARTHROPLASTY Left 04/22/2020   Procedure: TOTAL HIP ARTHROPLASTY ANTERIOR APPROACH;  Surgeon: Hessie Knows, MD;  Location: [facility] ORS;  Service: Orthopedics;  Laterality: Left;    Social History   Socioeconomic History   Marital status: Married    Spouse name: Sherri   Number of children: 2   Years of education: Not on file   Highest education level: Not on file  Occupational History   Not on file  Tobacco Use   Smoking status: Former    Pack years: 0.00    Types: Cigarettes    Quit date: 03/24/2001    Years since quitting: 19.3   Smokeless tobacco: Never  Vaping Use   Vaping Use: Never used  Substance and Sexual Activity   Alcohol use: No    Alcohol/week: 0.0 standard drinks   Drug use: No   Sexual activity: Not on file  Other Topics Concern   Not on file  Social History Narrative   Not on file   Social Determinants of Health   Financial Resource Strain: Not on file  Food Insecurity: Not on file  Transportation Needs: Not on file  Physical Activity: Not on file  Stress: Not on file  Social Connections: Not on file  Intimate Partner Violence: Not on file    Family History  Problem Relation Age of Onset   CAD Other      Current Outpatient Medications:    allopurinol (ZYLOPRIM) 100 MG tablet, Take 100 mg by mouth daily., Disp: , Rfl:    amLODipine (NORVASC) 10 MG tablet, Take 10 mg by mouth daily., Disp: , Rfl:    apixaban (ELIQUIS) 2.5 MG TABS tablet, Take 2.5 mg by mouth 2 (two) times daily., Disp: , Rfl:    aspirin 81 MG EC tablet, Take 81 mg by mouth daily., Disp: , Rfl:    calcitRIOL (ROCALTROL) 0.25 MCG capsule, Take 0.25 mcg by mouth daily., Disp: , Rfl:    cloNIDine (CATAPRES) 0.1 MG tablet, Take 1 tablet (0.1 mg total) by mouth 2 (two) times daily., Disp: 60 tablet, Rfl: 0   fexofenadine (ALLEGRA) 180 MG tablet, Take 180 mg by mouth daily as needed for allergies or rhinitis., Disp: , Rfl:    fluticasone (FLONASE) 50  MCG/ACT nasal spray, Place 2 sprays into both nostrils daily as needed for allergies., Disp: , Rfl:    folic acid (FOLVITE) 1 MG tablet, TAKE 2 TABLETS(2 MG) BY MOUTH DAILY, Disp: 180 tablet, Rfl: 2   gabapentin (NEURONTIN) 300 MG capsule, Take 300 mg by mouth at bedtime., Disp: , Rfl: 11   hydrocortisone (ANUSOL-HC) 2.5 % rectal cream, Place 1 application rectally 2 (two) times daily as needed for hemorrhoids or anal itching., Disp: , Rfl:    isosorbide mononitrate (IMDUR) 60 MG 24 hr tablet, Take 60 mg by mouth daily., Disp: , Rfl:    losartan (COZAAR) 100 MG tablet, Take 100 mg by mouth daily., Disp: , Rfl:    metoprolol succinate (TOPROL-XL) 100 MG 24 hr tablet, Take 150 mg by mouth daily. Take with or immediately following a  meal., Disp: , Rfl:    omeprazole (PRILOSEC) 20 MG capsule, Take 20 mg by mouth daily., Disp: , Rfl:    psyllium (METAMUCIL) 58.6 % packet, Take 1 packet by mouth 2 (two) times daily., Disp: , Rfl:    rosuvastatin (CRESTOR) 40 MG tablet, Take 40 mg by mouth daily., Disp: , Rfl:    torsemide (DEMADEX) 10 MG tablet, Take 1 tablet (10 mg total) by mouth daily. Hold this medication until you see your nephrologist, Disp: , Rfl:    traMADol (ULTRAM) 50 MG tablet, Take 1 tablet (50 mg total) by mouth every 6 (six) hours., Disp: 30 tablet, Rfl: 0   HYDROcodone-acetaminophen (NORCO/VICODIN) 5-325 MG tablet, Take 1-2 tablets by mouth every 4 (four) hours as needed for moderate pain (pain score 4-6). (Patient not taking: No sig reported), Disp: 30 tablet, Rfl: 0   nitroGLYCERIN (NITROSTAT) 0.4 MG SL tablet, Place 0.4 mg under the tongue every 5 (five) minutes as needed for chest pain. (Patient not taking: No sig reported), Disp: , Rfl:   No results found.  No images are attached to the encounter.   CMP Latest Ref Rng & Units 06/18/2020  Glucose 70 - 99 mg/dL 86  BUN 8 - 23 mg/dL 35(H)  Creatinine 0.61 - 1.24 mg/dL 3.85(H)  Sodium 135 - 145 mmol/L 139  Potassium 3.5 - 5.1 mmol/L  4.2  Chloride 98 - 111 mmol/L 114(H)  CO2 22 - 32 mmol/L 21(L)  Calcium 8.9 - 10.3 mg/dL 7.5(L)  Total Protein 6.5 - 8.1 g/dL 5.5(L)  Total Bilirubin 0.3 - 1.2 mg/dL 0.2(L)  Alkaline Phos 38 - 126 U/L 120  AST 15 - 41 U/L 11(L)  ALT 0 - 44 U/L 9   CBC Latest Ref Rng & Units 07/04/2020  WBC 4.0 - 10.5 K/uL 7.2  Hemoglobin 13.0 - 17.0 g/dL 7.2(L)  Hematocrit 39.0 - 52.0 % 21.9(L)  Platelets 150 - 400 K/uL 190     Observation/objective: Appears in no acute distress over video visit today.  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  He follows up with nephrology and has received 2 doses of Rituxan so far at the [facility] cancer center.  He will be receiving dose 3 tomorrow and dose for next week.  Anemia of chronic kidney disease: I will plan to give him 5 doses of Venofer considering that his ferritin is less than 200 although he is not on dialysis.  We will also offer him B12 shots along with Venofer.  He is on oral folate supplements.  I will repeat his H&H in 6 to 8 weeks time and if his hemoglobin is consistently less than 9 I will consider starting Retacrit at that time.  Discussed risks and benefits of IV iron including all but not limited to possible risk of infusion reaction, dizziness, leg spasms.  Patient understands and agrees to proceed as planned  Follow-up instructions:as above  I discussed the assessment and treatment plan with the patient. The patient was provided an opportunity to ask questions and all were answered. The patient agreed with the plan and demonstrated an understanding of the instructions.   The patient was advised to call back or seek an in-person evaluation if the symptoms worsen or if the condition fails to improve as anticipated.  Visit Diagnosis: 1. Folate deficiency  2. B12 deficiency   3. Anemia in stage 4 chronic kidney disease ([place])     Dr. Randa Evens, MD, MPH [facility] at [facility] Tel- [phone] 07/13/2020 10:34 AM

## 2020-07-18 ENCOUNTER — Inpatient Hospital Stay: Payer: Medicare PPO

## 2020-07-18 ENCOUNTER — Other Ambulatory Visit: Payer: Self-pay

## 2020-07-18 VITALS — BP 153/84 | HR 61 | Temp 96.3°F | Resp 16

## 2020-07-18 DIAGNOSIS — E538 Deficiency of other specified B group vitamins: Secondary | ICD-10-CM

## 2020-07-18 DIAGNOSIS — N031 Chronic nephritic syndrome with focal and segmental glomerular lesions: Secondary | ICD-10-CM | POA: Diagnosis not present

## 2020-07-18 DIAGNOSIS — N051 Unspecified nephritic syndrome with focal and segmental glomerular lesions: Secondary | ICD-10-CM

## 2020-07-18 MED ORDER — SODIUM CHLORIDE 0.9 % IV SOLN: 200.0000 mg | INTRAVENOUS | Status: DC

## 2020-07-18 MED ORDER — ACETAMINOPHEN 325 MG PO TABS
650.0000 mg | ORAL_TABLET | Freq: Once | ORAL | Status: AC
  Administered 2020-07-18: 650 mg via ORAL
  Filled 2020-07-18: qty 2

## 2020-07-18 MED ORDER — SODIUM CHLORIDE 0.9 % IV SOLN
375.0000 mg/m2 | Freq: Once | INTRAVENOUS | Status: AC
  Administered 2020-07-18: 700 mg via INTRAVENOUS
  Filled 2020-07-18: qty 70

## 2020-07-18 MED ORDER — DIPHENHYDRAMINE HCL 25 MG PO CAPS
50.0000 mg | ORAL_CAPSULE | Freq: Once | ORAL | Status: AC
  Administered 2020-07-18: 50 mg via ORAL
  Filled 2020-07-18: qty 2

## 2020-07-18 MED ORDER — SODIUM CHLORIDE 0.9 % IV SOLN
Freq: Once | INTRAVENOUS | Status: AC
  Filled 2020-07-18: qty 250

## 2020-07-18 MED ORDER — IRON SUCROSE 20 MG/ML IV SOLN
200.0000 mg | Freq: Once | INTRAVENOUS | Status: AC
  Administered 2020-07-18: 200 mg via INTRAVENOUS
  Filled 2020-07-18: qty 10

## 2020-07-18 NOTE — Progress Notes (Signed)
Pt received Rituximab and venofer today. Tolerated treatments well. No complaints at time of discharge, VSS.

## 2020-07-18 NOTE — Patient Instructions (Signed)
[facility]  Discharge Instructions: Thank you for choosing [facility] to provide your oncology and hematology care.  If you have a lab appointment with the [facility], please go directly to the [facility] and check in at the registration area.  Wear comfortable clothing and clothing appropriate for easy access to any Portacath or PICC line.   We strive to give you quality time with your provider. You may need to reschedule your appointment if you arrive late (15 or more minutes).  Arriving late affects you and other patients whose appointments are after yours.  Also, if you miss three or more appointments without notifying the office, you may be dismissed from the clinic at the provider's discretion.      For prescription refill requests, have your pharmacy contact our office and allow 72 hours for refills to be completed.    Today you received the following chemotherapy and/or immunotherapy agents Rituximab Rituximab; Hyaluronidase injection What is this medication? RITUXIMAB; HYALURONIDASE (ri TUX i mab / hye al ur ON i dase) is used to treat non-Hodgkin lymphoma and chronic lymphocytic leukemia. Rituximab is amonoclonal antibody. Hyaluronidase is used to improve the effects of rituximab. This medicine may be used for other purposes; ask your health care provider orpharmacist if you have questions. COMMON BRAND NAME(S): Rituxan Hycela What should I tell my care team before I take this medication? They need to know if you have any of these conditions: heart disease infection (especially a virus infection such as hepatitis B, chickenpox, cold sores, or herpes) immune system problems irregular heartbeat kidney disease lung or breathing disease, like asthma recently received or scheduled to receive a vaccine an unusual or allergic reaction to rituximab, rituximab/hyaluronidase, mouse proteins, other medicines, foods, dyes, or  preservatives pregnant or trying to get pregnant breast-feeding How should I use this medication? This medicine is for injection under the skin. It is given by a health careprofessional in a hospital or clinic setting. A special MedGuide will be given to you before each treatment. Be sure to readthis information carefully each time. Talk to your pediatrician regarding the use of this medicine in children.Special care may be needed. Overdosage: If you think you have taken too much of this medicine contact apoison control center or emergency room at once. NOTE: This medicine is only for you. Do not share this medicine with others. What if I miss a dose? It is important not to miss your dose. Call your doctor or health careprofessional if you are unable to keep an appointment. What may interact with this medication? This medicine may interact with the following medications: cisplatin live virus vaccines This list may not describe all possible interactions. Give your health care provider a list of all the medicines, herbs, non-prescription drugs, or dietary supplements you use. Also tell them if you smoke, drink alcohol, or use illegaldrugs. Some items may interact with your medicine. What should I watch for while using this medication? Your condition will be monitored carefully while you are receiving thismedicine. You may need blood work done while you are taking this medicine. This medicine can cause serious allergic reactions. To reduce your risk you may need to take medicine before treatment with this medicine. Take your medicineas directed. In some patients, this medicine may cause a serious brain infection that may cause death. If you have any problems seeing, thinking, speaking, walking, or standing, tell your doctor right away. If you cannot reach your doctor,urgently seek other  source of medical care. Call your doctor or health care professional for advice if you get a fever, chills or sore  throat, or other symptoms of a cold or flu. Do not treat yourself. This drug decreases your body's ability to fight infections. Try toavoid being around people who are sick. Do not become pregnant while taking this medicine or for 12 months after stopping it. Women should inform their doctor if they wish to become pregnant or think they might be pregnant. There is a potential for serious side effects to an unborn child. Talk to your health care professional or pharmacist for more information. Do not breast-feed an infant while taking this medicine orfor at least 6 months after stopping it. What side effects may I notice from receiving this medication? Side effects that you should report to your doctor or health care professionalas soon as possible: allergic reactions like skin rash, itching or hives; swelling of the face, lips, or tongue breathing problems chest pain changes in vision diarrhea dizziness headache with fever, neck stiffness, sensitivity to light, nausea, or confusion fast, irregular heartbeat loss of memory low blood counts - this medicine may decrease the number of white blood cells, red blood cells and platelets. You may be at increased risk for infections and bleeding. mouth sores problems with balance, talking, or walking redness, blistering, peeling or loosening of the skin, including inside the mouth signs of infection - fever or chills, cough, sore throat, pain or difficulty passing urine signs and symptoms of kidney injury like trouble passing urine or change in the amount of urine signs and symptoms of liver injury like dark yellow or brown urine; general ill feeling or flu-like symptoms; light-colored stools; loss of appetite; nausea; right upper belly pain; unusually weak or tired; yellowing of the eyes or skin stomach pain swelling of the ankles, feet, hands unusual bleeding or bruising vomiting Side effects that usually do not require medical attention (report  these toyour doctor or health care professional if they continue or are bothersome): headache joint pain muscle cramps or muscle pain nausea pain, redness, or irritation at site where injected tiredness This list may not describe all possible side effects. Call your doctor for medical advice about side effects. You may report side effects to FDA at1-800-FDA-1088. Where should I keep my medication? This drug is given in a hospital or clinic and will not be stored at home. NOTE: This sheet is a summary. It may not cover all possible information. If you have questions about this medicine, talk to your doctor, pharmacist, orhealth care provider.  2022 Elsevier/Gold Standard (2016-12-24 13:12:25)       To help prevent nausea and vomiting after your treatment, we encourage you to take your nausea medication as directed.  BELOW ARE SYMPTOMS THAT SHOULD BE REPORTED IMMEDIATELY: *FEVER GREATER THAN 100.4 F (38 C) OR HIGHER *CHILLS OR SWEATING *NAUSEA AND VOMITING THAT IS NOT CONTROLLED WITH YOUR NAUSEA MEDICATION *UNUSUAL SHORTNESS OF BREATH *UNUSUAL BRUISING OR BLEEDING *URINARY PROBLEMS (pain or burning when urinating, or frequent urination) *BOWEL PROBLEMS (unusual diarrhea, constipation, pain near the anus) TENDERNESS IN MOUTH AND THROAT WITH OR WITHOUT PRESENCE OF ULCERS (sore throat, sores in mouth, or a toothache) UNUSUAL RASH, SWELLING OR PAIN  UNUSUAL VAGINAL DISCHARGE OR ITCHING   Items with * indicate a potential emergency and should be followed up as soon as possible or go to the Emergency Department if any problems should occur.  Please show the CHEMOTHERAPY ALERT CARD or IMMUNOTHERAPY ALERT  CARD at check-in to the Emergency Department and triage nurse.  Should you have questions after your visit or need to cancel or reschedule your appointment, please contact [facility]  [phone] and follow the prompts.  Office hours are 8:00 a.m. to 4:30 p.m.  Monday - Friday. Please note that voicemails left after 4:00 p.m. may not be returned until the following business day.  We are closed weekends and major holidays. You have access to a nurse at all times for urgent questions. Please call the main number to the clinic [phone] and follow the prompts.  For any non-urgent questions, you may also contact your provider using MyChart. We now offer e-Visits for anyone 82 and older to request care online for non-urgent symptoms. For details visit [email or web address].   Also download the MyChart app! Go to the app store, search "MyChart", open the app, select [facility], and log in with your MyChart username and password.  Due to Covid, a mask is required upon entering the hospital/clinic. If you do not have a mask, one will be given to you upon arrival. For doctor visits, patients may have 1 support person aged 104 or older with them. For treatment visits, patients cannot have anyone with them due to current Covid guidelines and our immunocompromised population.

## 2020-07-30 ENCOUNTER — Telehealth: Payer: Self-pay | Admitting: Surgery

## 2020-07-30 NOTE — Telephone Encounter (Signed)
Outgoing call is made, left message for patient to call so that we can schedule evaluation for possible PD catheter placement with Dr. Christian Mate.

## 2020-08-05 ENCOUNTER — Other Ambulatory Visit: Payer: Self-pay

## 2020-08-05 ENCOUNTER — Telehealth: Payer: Self-pay

## 2020-08-05 ENCOUNTER — Encounter: Payer: Self-pay | Admitting: Surgery

## 2020-08-05 ENCOUNTER — Telehealth: Payer: Self-pay | Admitting: Surgery

## 2020-08-05 ENCOUNTER — Ambulatory Visit: Payer: Self-pay | Admitting: Surgery

## 2020-08-05 ENCOUNTER — Ambulatory Visit: Payer: Medicare PPO | Admitting: Surgery

## 2020-08-05 VITALS — BP 139/77 | HR 71 | Temp 97.9°F | Ht 69.0 in | Wt 149.8 lb

## 2020-08-05 DIAGNOSIS — N184 Chronic kidney disease, stage 4 (severe): Secondary | ICD-10-CM

## 2020-08-05 NOTE — Telephone Encounter (Signed)
Patient has been advised of Pre-Admission date/time, COVID Testing date and Surgery date.  Surgery Date: 08/13/20 Preadmission Testing Date: 08/08/20 (phone 1p-5p) Covid Testing Date: Not needed.     Patient has been made aware to call [phone], between 1-3:00pm the day before surgery, to find out what time to arrive for surgery.

## 2020-08-05 NOTE — Progress Notes (Signed)
Patient ID: Casey French, male   DOB: Jan 08, 1951, 70 y.o.   MRN: [id number]  Chief Complaint: Chronic renal failure, stage IV, impending end-stage renal disease.  History of Present Illness Casey French is a 70 y.o. male with the above diagnosis, referred for consideration of peritoneal dialysis catheter placement.  No prior history of abdominal surgery.  Current cardiac history keeps him taking Eliquis and baby aspirin.  He otherwise appears to ambulate a reasonable distances without complaints of shortness of breath or chest pain.  No prior history of inguinal hernia or recent history of groin bulges or groin pain.  No known history of abdominal issues i.e. diverticulitis or cholecystitis.  Past Medical History Past Medical History:  Diagnosis Date   Anemia    Anginal pain (HCC)    Aortic atherosclerosis (HCC)    Atrial fibrillation (HCC)    CKD (chronic kidney disease) stage 3, GFR 30-59 ml/min (HCC)    Coronary artery disease    Depression    Diabetes mellitus without complication (HCC)    Diverticulosis    GERD (gastroesophageal reflux disease)    Gout    Heart murmur    High cholesterol    Hypertension    STEMI (ST elevation myocardial infarction) ([place]) 2003   Inferior      Past Surgical History:  Procedure Laterality Date   CARDIAC CATHETERIZATION     CATARACT EXTRACTION W/PHACO Right 07/05/2019   Procedure: CATARACT EXTRACTION PHACO AND INTRAOCULAR LENS PLACEMENT ([place]) RIGHT DIABETIC;  Surgeon: Birder Robson, MD;  Location: [facility] ORS;  Service: Ophthalmology;  Laterality: Right;  US00:59.3 CDE8.41 LOT [id number]   COLONOSCOPY WITH PROPOFOL N/A 04/01/2017   Procedure: COLONOSCOPY WITH PROPOFOL;  Surgeon: Lollie Sails, MD;  Location: [facility] ENDOSCOPY;  Service: Endoscopy;  Laterality: N/A;   CORONARY ANGIOPLASTY WITH STENT PLACEMENT  10/2001   RCA   CORONARY ANGIOPLASTY WITH STENT PLACEMENT  07/2015   LAD   right hand surgery  2015   3 screws in right  hand   TOTAL HIP ARTHROPLASTY Left 04/22/2020   Procedure: TOTAL HIP ARTHROPLASTY ANTERIOR APPROACH;  Surgeon: Hessie Knows, MD;  Location: [facility] ORS;  Service: Orthopedics;  Laterality: Left;    Allergies  Allergen Reactions   Morphine Anxiety    agitation     Current Outpatient Medications  Medication Sig Dispense Refill   allopurinol (ZYLOPRIM) 100 MG tablet Take 100 mg by mouth daily.     amLODipine (NORVASC) 10 MG tablet Take 10 mg by mouth daily.     apixaban (ELIQUIS) 2.5 MG TABS tablet Take 2.5 mg by mouth 2 (two) times daily.     aspirin 81 MG EC tablet Take 81 mg by mouth daily.     calcitRIOL (ROCALTROL) 0.25 MCG capsule Take 0.25 mcg by mouth daily.     fexofenadine (ALLEGRA) 180 MG tablet Take 180 mg by mouth daily as needed for allergies or rhinitis.     fluticasone (FLONASE) 50 MCG/ACT nasal spray Place 2 sprays into both nostrils daily as needed for allergies.     folic acid (FOLVITE) 1 MG tablet TAKE 2 TABLETS(2 MG) BY MOUTH DAILY 180 tablet 2   gabapentin (NEURONTIN) 300 MG capsule Take 300 mg by mouth at bedtime.  11   hydrocortisone (ANUSOL-HC) 2.5 % rectal cream Place 1 application rectally 2 (two) times daily as needed for hemorrhoids or anal itching.     isosorbide mononitrate (IMDUR) 60 MG 24 hr tablet Take 60 mg by mouth daily.  losartan (COZAAR) 100 MG tablet Take 100 mg by mouth daily.     metoprolol succinate (TOPROL-XL) 100 MG 24 hr tablet Take 150 mg by mouth daily. Take with or immediately following a meal.     nitroGLYCERIN (NITROSTAT) 0.4 MG SL tablet Place 0.4 mg under the tongue every 5 (five) minutes as needed for chest pain.     omeprazole (PRILOSEC) 20 MG capsule Take 20 mg by mouth daily.     psyllium (METAMUCIL) 58.6 % packet Take 1 packet by mouth 2 (two) times daily.     rosuvastatin (CRESTOR) 40 MG tablet Take 40 mg by mouth daily.     torsemide (DEMADEX) 10 MG tablet Take 1 tablet (10 mg total) by mouth daily. Hold this medication until you  see your nephrologist     traMADol (ULTRAM) 50 MG tablet Take 1 tablet (50 mg total) by mouth every 6 (six) hours. 30 tablet 0   cloNIDine (CATAPRES) 0.1 MG tablet Take 1 tablet (0.1 mg total) by mouth 2 (two) times daily. 60 tablet 0   HYDROcodone-acetaminophen (NORCO/VICODIN) 5-325 MG tablet Take 1-2 tablets by mouth every 4 (four) hours as needed for moderate pain (pain score 4-6). 30 tablet 0   No current facility-administered medications for this visit.    Family History Family History  Problem Relation Age of Onset   Heart disease Mother    Heart disease Father    CAD Other       Social History Social History   Tobacco Use   Smoking status: Former    Pack years: 0.00    Types: Cigarettes    Quit date: 03/24/2001    Years since quitting: 19.3   Smokeless tobacco: Never  Vaping Use   Vaping Use: Never used  Substance Use Topics   Alcohol use: No    Alcohol/week: 0.0 standard drinks   Drug use: No        Review of Systems  Constitutional:  Positive for weight loss.  HENT:  Positive for hearing loss.   Eyes: Negative.   Respiratory:  Positive for shortness of breath.   Cardiovascular: Negative.   Gastrointestinal:  Positive for blood in stool (Negative colonoscopy March, 2019.). Negative for abdominal pain, constipation, diarrhea, heartburn, melena, nausea and vomiting.  Genitourinary: Negative.  Negative for dysuria, flank pain, frequency, hematuria and urgency.  Skin: Negative.   Neurological: Negative.   Psychiatric/Behavioral: Negative.       Physical Exam Blood pressure 139/77, pulse 71, temperature 97.9 F (36.6 C), temperature source Oral, height '5\' 9"'$  (1.753 m), weight 149 lb 12.8 oz (67.9 kg), SpO2 96 %. Last Weight  Most recent update: 08/05/2020  9:31 AM    Weight  67.9 kg (149 lb 12.8 oz)             CONSTITUTIONAL: Well developed, and nourished, appropriately responsive and aware without distress.   EYES: Sclera non-icteric.   EARS, NOSE,  MOUTH AND THROAT: Mask worn.  The oropharynx is clear. Oral mucosa is pink and moist.    Hearing is intact to voice.  NECK: Trachea is midline, and there is no jugular venous distension.  LYMPH NODES:  Lymph nodes in the neck are not enlarged. RESPIRATORY:  Lungs are clear, and breath sounds are equal bilaterally. Normal respiratory effort without pathologic use of accessory muscles. CARDIOVASCULAR: Heart is regular in rate and rhythm. GI: The abdomen is flat, soft, nontender, and nondistended. There were no palpable masses. I did not appreciate hepatosplenomegaly. There  were normal bowel sounds.  There were no scars. GU: No appreciable inguinal hernias.  Testicles descended. MUSCULOSKELETAL:  Symmetrical muscle tone appreciated in all four extremities.    SKIN: Skin turgor is normal. No pathologic skin lesions appreciated.  NEUROLOGIC:  Motor and sensation appear grossly normal.  Cranial nerves are grossly without defect. PSYCH:  Alert and oriented to person, place and time. Affect is appropriate for situation.  Data Reviewed I have personally reviewed what is currently available of the patient's imaging, recent labs and medical records.   Labs:  CBC Latest Ref Rng & Units 07/04/2020 06/18/2020 04/26/2020  WBC 4.0 - 10.5 K/uL 7.2 7.5 6.6  Hemoglobin 13.0 - 17.0 g/dL 7.2(L) 7.5(L) 7.8(L)  Hematocrit 39.0 - 52.0 % 21.9(L) 24.2(L) 23.9(L)  Platelets 150 - 400 K/uL 190 229 194   CMP Latest Ref Rng & Units 06/18/2020 04/26/2020 04/23/2020  Glucose 70 - 99 mg/dL 86 - 119(H)  BUN 8 - 23 mg/dL 35(H) - 30(H)  Creatinine 0.61 - 1.24 mg/dL 3.85(H) 4.10(H) 2.92(H)  Sodium 135 - 145 mmol/L 139 - 138  Potassium 3.5 - 5.1 mmol/L 4.2 - 4.4  Chloride 98 - 111 mmol/L 114(H) - 113(H)  CO2 22 - 32 mmol/L 21(L) - 19(L)  Calcium 8.9 - 10.3 mg/dL 7.5(L) - 7.6(L)  Total Protein 6.5 - 8.1 g/dL 5.5(L) - -  Total Bilirubin 0.3 - 1.2 mg/dL 0.2(L) - -  Alkaline Phos 38 - 126 U/L 120 - -  AST 15 - 41 U/L 11(L) - -  ALT  0 - 44 U/L 9 - -     Imaging:  Within last 24 hrs: No results found.  Assessment    Chronic kidney disease, stage IV. 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   S/P hip replacement 04/22/2020   Hospital discharge follow-up 02/19/2020   Iron deficiency anemia 02/19/2020   Preop cardiovascular exam 01/09/2020   Anemia in chronic kidney disease 11/15/2019   Benign hypertensive kidney disease with chronic kidney disease 11/15/2019   Chronic kidney disease, stage 3b ([place]) 11/15/2019   Proteinuria 11/15/2019   Hematemesis 05/15/2019   Hematochezia 05/15/2019   Chronic anticoagulation 05/15/2019   Atrial fibrillation, chronic ([place]) 05/15/2019   GI bleed 05/15/2019   Chronic kidney disease 05/15/2019   Unstable angina ([place]) 11/10/2016   S/P cardiac catheterization 09/09/2015   Angina at rest [facility]) 05/01/2015   Hypertensive urgency 06/14/2014   Acute diverticulitis 06/14/2014   AKI (acute kidney injury) ([place]) 06/14/2014   Benign essential HTN 06/14/2014   Hyperlipidemia 06/14/2014   DM type 2 (diabetes mellitus, type 2) ([place]) 06/14/2014   CAD (coronary artery disease) 06/14/2014   GERD without esophagitis 06/14/2014   Acute kidney failure ([place]) 06/14/2014   Tremor 03/18/2014   Fall at home 08/30/2013   SOB (shortness of breath) on exertion 08/30/2013   Closed head injury 03/24/2013   Unspecified injury of head, initial encounter 03/24/2013    Plan    Laparoscopic placement of continuous ambulatory peritoneal dialysis catheter. He has elected to proceed with peritoneal dialysis catheter placement, anticipating peritoneal dialysis.  We reviewed the risks of PD catheter placement, which include but are not limited to anesthesia, bleeding, infection, catheter dysfunction, need for revision, need for omentopexy, etc.  I believe he is also aware that due to other circumstances  complications may arise involving peritonitis or other intra-abdominal requirements of surgery that may challenge proper  function of PD catheter.  Were all in agreement that this is his first option and hopefully can have a long-lasting good function and success with this approach. He is well aware of alternatives of hemodialysis.  He presents with his wife and daughter, both well engaged in the discussion and choices being made. Face-to-face time spent with the patient and accompanying care providers(if present) was 45 minutes, with more than 50% of the time spent counseling, educating, and coordinating care of the patient.    These notes generated with voice recognition software. I apologize for typographical errors.  Ronny Bacon M.D., FACS 08/05/2020, 9:56 AM

## 2020-08-05 NOTE — Patient Instructions (Addendum)
Our surgery scheduler will call you within 24-48 hours to schedule your surgery. Please have the Blue surgery sheet available when speaking with her.  

## 2020-08-05 NOTE — H&P (View-Only) (Signed)
Patient ID: Casey French, male   DOB: 1950-04-03, 70 y.o.   MRN: [id number]  Chief Complaint: Chronic renal failure, stage IV, impending end-stage renal disease.  History of Present Illness Casey French is a 70 y.o. male with the above diagnosis, referred for consideration of peritoneal dialysis catheter placement.  No prior history of abdominal surgery.  Current cardiac history keeps him taking Eliquis and baby aspirin.  He otherwise appears to ambulate a reasonable distances without complaints of shortness of breath or chest pain.  No prior history of inguinal hernia or recent history of groin bulges or groin pain.  No known history of abdominal issues i.e. diverticulitis or cholecystitis.  Past Medical History Past Medical History:  Diagnosis Date   Anemia    Anginal pain (HCC)    Aortic atherosclerosis (HCC)    Atrial fibrillation (HCC)    CKD (chronic kidney disease) stage 3, GFR 30-59 ml/min (HCC)    Coronary artery disease    Depression    Diabetes mellitus without complication (HCC)    Diverticulosis    GERD (gastroesophageal reflux disease)    Gout    Heart murmur    High cholesterol    Hypertension    STEMI (ST elevation myocardial infarction) ([place]) 2003   Inferior      Past Surgical History:  Procedure Laterality Date   CARDIAC CATHETERIZATION     CATARACT EXTRACTION W/PHACO Right 07/05/2019   Procedure: CATARACT EXTRACTION PHACO AND INTRAOCULAR LENS PLACEMENT ([place]) RIGHT DIABETIC;  Surgeon: Birder Robson, MD;  Location: [facility] ORS;  Service: Ophthalmology;  Laterality: Right;  US00:59.3 CDE8.41 LOT [id number]   COLONOSCOPY WITH PROPOFOL N/A 04/01/2017   Procedure: COLONOSCOPY WITH PROPOFOL;  Surgeon: Lollie Sails, MD;  Location: [facility] ENDOSCOPY;  Service: Endoscopy;  Laterality: N/A;   CORONARY ANGIOPLASTY WITH STENT PLACEMENT  10/2001   RCA   CORONARY ANGIOPLASTY WITH STENT PLACEMENT  07/2015   LAD   right hand surgery  2015   3 screws in right  hand   TOTAL HIP ARTHROPLASTY Left 04/22/2020   Procedure: TOTAL HIP ARTHROPLASTY ANTERIOR APPROACH;  Surgeon: Hessie Knows, MD;  Location: [facility] ORS;  Service: Orthopedics;  Laterality: Left;    Allergies  Allergen Reactions   Morphine Anxiety    agitation     Current Outpatient Medications  Medication Sig Dispense Refill   allopurinol (ZYLOPRIM) 100 MG tablet Take 100 mg by mouth daily.     amLODipine (NORVASC) 10 MG tablet Take 10 mg by mouth daily.     apixaban (ELIQUIS) 2.5 MG TABS tablet Take 2.5 mg by mouth 2 (two) times daily.     aspirin 81 MG EC tablet Take 81 mg by mouth daily.     calcitRIOL (ROCALTROL) 0.25 MCG capsule Take 0.25 mcg by mouth daily.     fexofenadine (ALLEGRA) 180 MG tablet Take 180 mg by mouth daily as needed for allergies or rhinitis.     fluticasone (FLONASE) 50 MCG/ACT nasal spray Place 2 sprays into both nostrils daily as needed for allergies.     folic acid (FOLVITE) 1 MG tablet TAKE 2 TABLETS(2 MG) BY MOUTH DAILY 180 tablet 2   gabapentin (NEURONTIN) 300 MG capsule Take 300 mg by mouth at bedtime.  11   hydrocortisone (ANUSOL-HC) 2.5 % rectal cream Place 1 application rectally 2 (two) times daily as needed for hemorrhoids or anal itching.     isosorbide mononitrate (IMDUR) 60 MG 24 hr tablet Take 60 mg by mouth daily.  losartan (COZAAR) 100 MG tablet Take 100 mg by mouth daily.     metoprolol succinate (TOPROL-XL) 100 MG 24 hr tablet Take 150 mg by mouth daily. Take with or immediately following a meal.     nitroGLYCERIN (NITROSTAT) 0.4 MG SL tablet Place 0.4 mg under the tongue every 5 (five) minutes as needed for chest pain.     omeprazole (PRILOSEC) 20 MG capsule Take 20 mg by mouth daily.     psyllium (METAMUCIL) 58.6 % packet Take 1 packet by mouth 2 (two) times daily.     rosuvastatin (CRESTOR) 40 MG tablet Take 40 mg by mouth daily.     torsemide (DEMADEX) 10 MG tablet Take 1 tablet (10 mg total) by mouth daily. Hold this medication until you  see your nephrologist     traMADol (ULTRAM) 50 MG tablet Take 1 tablet (50 mg total) by mouth every 6 (six) hours. 30 tablet 0   cloNIDine (CATAPRES) 0.1 MG tablet Take 1 tablet (0.1 mg total) by mouth 2 (two) times daily. 60 tablet 0   HYDROcodone-acetaminophen (NORCO/VICODIN) 5-325 MG tablet Take 1-2 tablets by mouth every 4 (four) hours as needed for moderate pain (pain score 4-6). 30 tablet 0   No current facility-administered medications for this visit.    Family History Family History  Problem Relation Age of Onset   Heart disease Mother    Heart disease Father    CAD Other       Social History Social History   Tobacco Use   Smoking status: Former    Pack years: 0.00    Types: Cigarettes    Quit date: 03/24/2001    Years since quitting: 19.3   Smokeless tobacco: Never  Vaping Use   Vaping Use: Never used  Substance Use Topics   Alcohol use: No    Alcohol/week: 0.0 standard drinks   Drug use: No        Review of Systems  Constitutional:  Positive for weight loss.  HENT:  Positive for hearing loss.   Eyes: Negative.   Respiratory:  Positive for shortness of breath.   Cardiovascular: Negative.   Gastrointestinal:  Positive for blood in stool (Negative colonoscopy March, 2019.). Negative for abdominal pain, constipation, diarrhea, heartburn, melena, nausea and vomiting.  Genitourinary: Negative.  Negative for dysuria, flank pain, frequency, hematuria and urgency.  Skin: Negative.   Neurological: Negative.   Psychiatric/Behavioral: Negative.       Physical Exam Blood pressure 139/77, pulse 71, temperature 97.9 F (36.6 C), temperature source Oral, height '5\' 9"'$  (1.753 m), weight 149 lb 12.8 oz (67.9 kg), SpO2 96 %. Last Weight  Most recent update: 08/05/2020  9:31 AM    Weight  67.9 kg (149 lb 12.8 oz)             CONSTITUTIONAL: Well developed, and nourished, appropriately responsive and aware without distress.   EYES: Sclera non-icteric.   EARS, NOSE,  MOUTH AND THROAT: Mask worn.  The oropharynx is clear. Oral mucosa is pink and moist.    Hearing is intact to voice.  NECK: Trachea is midline, and there is no jugular venous distension.  LYMPH NODES:  Lymph nodes in the neck are not enlarged. RESPIRATORY:  Lungs are clear, and breath sounds are equal bilaterally. Normal respiratory effort without pathologic use of accessory muscles. CARDIOVASCULAR: Heart is regular in rate and rhythm. GI: The abdomen is flat, soft, nontender, and nondistended. There were no palpable masses. I did not appreciate hepatosplenomegaly. There  were normal bowel sounds.  There were no scars. GU: No appreciable inguinal hernias.  Testicles descended. MUSCULOSKELETAL:  Symmetrical muscle tone appreciated in all four extremities.    SKIN: Skin turgor is normal. No pathologic skin lesions appreciated.  NEUROLOGIC:  Motor and sensation appear grossly normal.  Cranial nerves are grossly without defect. PSYCH:  Alert and oriented to person, place and time. Affect is appropriate for situation.  Data Reviewed I have personally reviewed what is currently available of the patient's imaging, recent labs and medical records.   Labs:  CBC Latest Ref Rng & Units 07/04/2020 06/18/2020 04/26/2020  WBC 4.0 - 10.5 K/uL 7.2 7.5 6.6  Hemoglobin 13.0 - 17.0 g/dL 7.2(L) 7.5(L) 7.8(L)  Hematocrit 39.0 - 52.0 % 21.9(L) 24.2(L) 23.9(L)  Platelets 150 - 400 K/uL 190 229 194   CMP Latest Ref Rng & Units 06/18/2020 04/26/2020 04/23/2020  Glucose 70 - 99 mg/dL 86 - 119(H)  BUN 8 - 23 mg/dL 35(H) - 30(H)  Creatinine 0.61 - 1.24 mg/dL 3.85(H) 4.10(H) 2.92(H)  Sodium 135 - 145 mmol/L 139 - 138  Potassium 3.5 - 5.1 mmol/L 4.2 - 4.4  Chloride 98 - 111 mmol/L 114(H) - 113(H)  CO2 22 - 32 mmol/L 21(L) - 19(L)  Calcium 8.9 - 10.3 mg/dL 7.5(L) - 7.6(L)  Total Protein 6.5 - 8.1 g/dL 5.5(L) - -  Total Bilirubin 0.3 - 1.2 mg/dL 0.2(L) - -  Alkaline Phos 38 - 126 U/L 120 - -  AST 15 - 41 U/L 11(L) - -  ALT  0 - 44 U/L 9 - -     Imaging:  Within last 24 hrs: No results found.  Assessment    Chronic kidney disease, stage IV. 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   S/P hip replacement 04/22/2020   Hospital discharge follow-up 02/19/2020   Iron deficiency anemia 02/19/2020   Preop cardiovascular exam 01/09/2020   Anemia in chronic kidney disease 11/15/2019   Benign hypertensive kidney disease with chronic kidney disease 11/15/2019   Chronic kidney disease, stage 3b ([place]) 11/15/2019   Proteinuria 11/15/2019   Hematemesis 05/15/2019   Hematochezia 05/15/2019   Chronic anticoagulation 05/15/2019   Atrial fibrillation, chronic ([place]) 05/15/2019   GI bleed 05/15/2019   Chronic kidney disease 05/15/2019   Unstable angina ([place]) 11/10/2016   S/P cardiac catheterization 09/09/2015   Angina at rest [facility]) 05/01/2015   Hypertensive urgency 06/14/2014   Acute diverticulitis 06/14/2014   AKI (acute kidney injury) ([place]) 06/14/2014   Benign essential HTN 06/14/2014   Hyperlipidemia 06/14/2014   DM type 2 (diabetes mellitus, type 2) ([place]) 06/14/2014   CAD (coronary artery disease) 06/14/2014   GERD without esophagitis 06/14/2014   Acute kidney failure ([place]) 06/14/2014   Tremor 03/18/2014   Fall at home 08/30/2013   SOB (shortness of breath) on exertion 08/30/2013   Closed head injury 03/24/2013   Unspecified injury of head, initial encounter 03/24/2013    Plan    Laparoscopic placement of continuous ambulatory peritoneal dialysis catheter. He has elected to proceed with peritoneal dialysis catheter placement, anticipating peritoneal dialysis.  We reviewed the risks of PD catheter placement, which include but are not limited to anesthesia, bleeding, infection, catheter dysfunction, need for revision, need for omentopexy, etc.  I believe he is also aware that due to other circumstances  complications may arise involving peritonitis or other intra-abdominal requirements of surgery that may challenge proper  function of PD catheter.  Were all in agreement that this is his first option and hopefully can have a long-lasting good function and success with this approach. He is well aware of alternatives of hemodialysis.  He presents with his wife and daughter, both well engaged in the discussion and choices being made. Face-to-face time spent with the patient and accompanying care providers(if present) was 45 minutes, with more than 50% of the time spent counseling, educating, and coordinating care of the patient.    These notes generated with voice recognition software. I apologize for typographical errors.  Ronny Bacon M.D., FACS 08/05/2020, 9:56 AM

## 2020-08-05 NOTE — Telephone Encounter (Signed)
Cardiac and Anticoagulant clearance faxed to Dr.Paraschos at this [id number]. Patient taking Eliquis

## 2020-08-08 ENCOUNTER — Encounter
Admission: RE | Admit: 2020-08-08 | Discharge: 2020-08-08 | Disposition: A | Discharge status: Home / Self Care | Payer: Medicare PPO | Source: Ambulatory Visit | Attending: Surgery | Admitting: Surgery

## 2020-08-08 ENCOUNTER — Other Ambulatory Visit: Payer: Self-pay

## 2020-08-08 NOTE — Patient Instructions (Addendum)
Your procedure is scheduled on: August 13, 2020 [facility] Report to the Registration Desk on the 1st floor of the [facility]. To find out your arrival time, please call [phone] between 1PM - 3PM on: Tuesday August 12, 2020  REMEMBER: Instructions that are not followed completely may result in serious medical risk, up to and including death; or upon the discretion of your surgeon and anesthesiologist your surgery may need to be rescheduled.  DO NOT EAT OR DRINK after midnight the night before surgery.  No gum chewing, lozengers or hard candies.  TAKE THESE MEDICATIONS THE MORNING OF SURGERY WITH A SIP OF WATER: Metoprolol isosorbide Amlodipine Allopurinol Rosuvastatin omeprazole (take one the night before and one on the morning of surgery - helps to prevent nausea after surgery.)  FOLLOW THE RECOMMENDATION OF THE CARDIOLOGIST FOR STOPPING THE ASPIRIN AND APIXABAN( ELIQUIS). DR RODENBERG'S OFFICE HAS SENT A REQUEST TO YOUR CARDIOLOGIST TO OBTAIN THIS INFORMATION  One week prior to surgery: Stop Anti-inflammatories (NSAIDS) such as Advil, Aleve, Ibuprofen, Motrin, Naproxen, Naprosyn and Aspirin based products such as Excedrin, Goodys Powder, BC Powder. Stop ANY OVER THE COUNTER supplements until after surgery. You may however, continue to take Tylenol if needed for pain up until the day of surgery.  No Alcohol for 24 hours before or after surgery.  No Smoking including e-cigarettes for 24 hours prior to surgery.  No chewable tobacco products for at least 6 hours prior to surgery.  No nicotine patches on the day of surgery.  Do not use any "recreational" drugs for at least a week prior to your surgery.  Please be advised that the combination of cocaine and anesthesia may have negative outcomes, up to and including death. If you test positive for cocaine, your surgery will be cancelled.  On the morning of surgery brush your teeth with toothpaste and water, you may rinse your mouth  with mouthwash if you wish. Do not swallow any toothpaste or mouthwash.  Do not wear jewelry, make-up, hairpins, clips or nail polish.  Do not wear lotions, powders, or perfumes or deodorant   Do not shave body from the neck down 48 hours prior to surgery just in case you cut yourself which could leave a site for infection.  Also, freshly shaved skin may become irritated if using the CHG soap.  Contact lenses, hearing aids and dentures may not be worn into surgery.  Do not bring valuables to the hospital. [facility] is not responsible for any missing/lost belongings or valuables.   Use CHG Soap as directed on instruction sheet.  Notify your doctor if there is any change in your medical condition (cold, fever, infection).  Wear comfortable clothing (specific to your surgery type) to the hospital.  After surgery, you can help prevent lung complications by doing breathing exercises.  Take deep breaths and cough every 1-2 hours. Your doctor may order a device called an Incentive Spirometer to help you take deep breaths. When coughing or sneezing, hold a pillow firmly against your incision with both hands. This is called "splinting." Doing this helps protect your incision. It also decreases belly discomfort.  If you are being discharged the day of surgery, you will not be allowed to drive home. You will need a responsible adult (18 years or older) to drive you home and stay with you that night.   If you are taking public transportation, you will need to have a responsible adult (18 years or older) with you. Please confirm with your  physician that it is acceptable to use public transportation.   Please call the [place] [facility] at [phone] if you have any questions about these instructions.  Surgery Visitation Policy:  Patients undergoing a surgery or procedure may have one family member or support person with them as long as that person is not COVID-19 positive or  experiencing its symptoms.  That person may remain in the waiting area during the procedure.  Inpatient Visitation:    Visiting hours are 7 a.m. to 8 p.m. Inpatients will be allowed two visitors daily. The visitors may change each day during the patient's stay. No visitors under the age of 71. Any visitor under the age of 19 must be accompanied by an adult. The visitor must pass COVID-19 screenings, use hand sanitizer when entering and exiting the patient's room and wear a mask at all times, including in the patient's room. Patients must also wear a mask when staff or their visitor are in the room. Masking is required regardless of vaccination status.

## 2020-08-11 ENCOUNTER — Telehealth: Payer: Self-pay

## 2020-08-11 ENCOUNTER — Telehealth: Payer: Self-pay | Admitting: *Deleted

## 2020-08-11 NOTE — Telephone Encounter (Signed)
Anticoagulant /Cardiac Clearance received from Dr.Paraschos-patient optimized for surgery low risk-may hold Eliquis 3 days prior to procedure hold ASA 7 days prior to procedure .  Left message for patient to call office for the instructions listed above.

## 2020-08-11 NOTE — Progress Notes (Signed)
Perioperative Services Pre-Admission/Anesthesia Testing    Date: 08/11/20  Name: Casey French MRN:   [id number]  Re: Review and clearance for surgery   Case: [id number] Date/Time: 08/13/20 0815   Procedure: LAPAROSCOPIC INSERTION CONTINUOUS AMBULATORY PERITONEAL DIALYSIS  (CAPD) CATHETER   Anesthesia type: General   Pre-op diagnosis: Stage IV CRF   Location: [facility] OR ROOM 06 / [place] ORS FOR ANESTHESIA GROUP   Surgeons: Casey Bacon, MD   Patient scheduled for the above procedure on 08/13/2020 with Dr. Ronny Bacon, MD.  Patient previously reviewed by PAT APP prior to elective total hip arthroplasty that was performed on 04/22/2020; see my note dated 04/14/2020. Interval health history/records reviewed.   Patient underwent an uncomplicated LEFT total hip arthroplasty on 04/22/2020. He discharged home on POD-2.  Patient has been doing well overall since his hip surgery.  Patient currently on treatment for FSGS using rituximab infusions.  Additionally, he is receiving intravenous iron supplementation at this time.  In review of the notes from the cancer center, patient is tolerating treatments well with no adverse effects.  There have been no other interval changes to his health history that would need to be considered prior to patient proceeding with planned procedural/anesthetic course.  Patient previously cleared by cardiology for hip surgery, however given the length of time that is been since his procedure, presurgical cardiac clearance was sought by the performing surgeon's office and PAT team for upcoming procedure.  Per cardiology, "patient is optimized for surgery and may proceed at an overall LOW risk of significant perioperative cardiovascular complications". Patient has been informed of the on recommendations for holding his daily low-dose apixaban for 3 days (last dose 08/09/2020) and ASA for 7 days (last dose 08/05/2020) prior to his procedure with plans to restart as soon  as postoperative bleeding risk felt to be minimized by his attending surgeon.  Pertinent Clinical Results:   Lab Results  Component Value Date   WBC 7.2 07/04/2020   HGB 7.2 (L) 07/04/2020   HCT 21.9 (L) 07/04/2020   MCV 94.8 07/04/2020   PLT 190 07/04/2020   Lab Results  Component Value Date   NA 139 06/18/2020   K 4.2 06/18/2020   CO2 21 (L) 06/18/2020   GLUCOSE 86 06/18/2020   BUN 35 (H) 06/18/2020   CREATININE 3.85 (H) 06/18/2020   CALCIUM 7.5 (L) 06/18/2020   GFRNONAA 16 (L) 06/18/2020   GFRAA 41 (L) 05/14/2019    ECG: Date: 04/10/2020 Time ECG obtained: 1956 PM Rate: 78 bpm Rhythm: normal sinus Axis (leads I and aVF): Normal Intervals: PR 138 ms. QRS 96 ms. QTc 458 ms. ST segment and T wave changes: Nonspecific ST and T wave abnormalities Comparison: Similar to previous tracing obtained on 02/02/2020  IMAGING / PROCEDURES: LEXISCAN performed on 03/06/2020 LVEF 57% Regional wall motion reveals normal myocardial thickening and wall motion There were no artifacts noted Left ventricular cavity size normal Small perfusion abnormality of mild intensity present in the inferior region on stress imaging The overall quality of study is good   ECHOCARDIOGRAM performed on 02/04/2020 Left ventricular ejection fraction, by estimation, is 60 to 65%. The left ventricle has normal function. The left ventricle has no regional wall motion abnormalities. Left ventricular diastolic parameters were normal. Right ventricular systolic function is normal. The right ventricular size is normal. The mitral valve is grossly normal. Trivial mitral valve regurgitation. The aortic valve is normal in structure. Aortic valve regurgitation is not visualized.    LEFT HEART CATHETERIZATION  AND CORONARY ANGIOGRAPHY performed on 08/21/2015 RCA is occluded at the site of the prior stent 70% mid LAD stenosis and 50% distal LAD stenosis.   FFR in distal LAD was 0.70 and in the mid LAD was  0.79 Myocardial bridging and distal LAD in the segment of the artery within the distal LAD stenosis Normal LVEDP of 9 mmHg Successful PCI of the mid LAD with placement of an Xience drug-eluting stent  Impression and Plan:  Casey French has been referred for pre-anesthesia review and clearance prior to him undergoing the planned anesthetic and procedural courses. Available labs, pertinent testing, and imaging results were personally reviewed by me. This patient has been appropriately cleared by cardiology with an overall LOW risk of significant perioperative cardiovascular complications.  Based on clinical review performed today (08/11/20), barring any significant acute changes in the patient's overall condition, it is anticipated that he will be able to proceed with the planned surgical intervention. Any acute changes in clinical condition may necessitate his procedure being postponed and/or cancelled. Patient will meet with anesthesia team (MD and/or CRNA) on this day of his procedure for preoperative evaluation/assessment.   Pre-surgical instructions were reviewed with the patient during his PAT appointment and questions were fielded by PAT clinical staff. Patient was advised that if any questions or concerns arise prior to his procedure then he should return a call to PAT and/or his surgeon's office to discuss.  Honor Loh, MSN, APRN, FNP-C, CEN [facility]  Peri-operative Services Nurse Practitioner Phone: [phone] 08/11/20 12:05 PM  NOTE: This note has been prepared using Dragon dictation software. Despite my best ability to proofread, there is always the potential that unintentional transcriptional errors may still occur from this process.

## 2020-08-11 NOTE — Telephone Encounter (Signed)
Patient called back and is aware to hold Eliquis for 3 days prior and patient has already stopped the aspirin and is aware that it was 7 days prior

## 2020-08-12 MED ORDER — CEFAZOLIN SODIUM-DEXTROSE 2-4 GM/100ML-% IV SOLN
2.0000 g | INTRAVENOUS | Status: AC
  Administered 2020-08-13: 2 g via INTRAVENOUS

## 2020-08-12 MED ORDER — ACETAMINOPHEN 500 MG PO TABS: 1000.0000 mg | ORAL_TABLET | ORAL | Status: AC

## 2020-08-12 MED ORDER — CHLORHEXIDINE GLUCONATE CLOTH 2 % EX PADS
6.0000 | MEDICATED_PAD | Freq: Once | CUTANEOUS | Status: AC
Start: 2020-08-12 — End: 2020-08-12
  Administered 2020-08-12: 6 via TOPICAL

## 2020-08-12 MED ORDER — BUPIVACAINE LIPOSOME 1.3 % IJ SUSP
20.0000 mL | Freq: Once | INTRAMUSCULAR | Status: DC
Start: 2020-08-12 — End: 2020-08-13

## 2020-08-12 MED ORDER — SODIUM CHLORIDE 0.9 % IV SOLN: INTRAVENOUS | Status: DC

## 2020-08-12 MED ORDER — CHLORHEXIDINE GLUCONATE CLOTH 2 % EX PADS
6.0000 | MEDICATED_PAD | Freq: Once | CUTANEOUS | Status: DC
Start: 2020-08-12 — End: 2020-08-13

## 2020-08-12 MED ORDER — GABAPENTIN 300 MG PO CAPS
300.0000 mg | ORAL_CAPSULE | ORAL | Status: AC
Start: 2020-08-13 — End: 2020-08-13

## 2020-08-13 ENCOUNTER — Ambulatory Visit: Payer: Medicare PPO | Admitting: Urgent Care

## 2020-08-13 ENCOUNTER — Encounter: Payer: Self-pay | Admitting: Surgery

## 2020-08-13 ENCOUNTER — Encounter
Admission: RE | Disposition: A | Discharge status: Home / Self Care | Payer: Self-pay | Source: Home / Self Care | Attending: Surgery

## 2020-08-13 ENCOUNTER — Ambulatory Visit
Admission: RE | Admit: 2020-08-13 | Discharge: 2020-08-13 | Disposition: A | Discharge status: Home / Self Care | Payer: Medicare PPO | Attending: Surgery | Admitting: Surgery

## 2020-08-13 DIAGNOSIS — N184 Chronic kidney disease, stage 4 (severe): Secondary | ICD-10-CM | POA: Diagnosis not present

## 2020-08-13 DIAGNOSIS — E1122 Type 2 diabetes mellitus with diabetic chronic kidney disease: Secondary | ICD-10-CM | POA: Diagnosis not present

## 2020-08-13 DIAGNOSIS — N185 Chronic kidney disease, stage 5: Secondary | ICD-10-CM

## 2020-08-13 DIAGNOSIS — Z955 Presence of coronary angioplasty implant and graft: Secondary | ICD-10-CM | POA: Diagnosis not present

## 2020-08-13 DIAGNOSIS — Z79899 Other long term (current) drug therapy: Secondary | ICD-10-CM | POA: Diagnosis not present

## 2020-08-13 DIAGNOSIS — Z87891 Personal history of nicotine dependence: Secondary | ICD-10-CM | POA: Diagnosis not present

## 2020-08-13 DIAGNOSIS — I129 Hypertensive chronic kidney disease with stage 1 through stage 4 chronic kidney disease, or unspecified chronic kidney disease: Secondary | ICD-10-CM | POA: Diagnosis not present

## 2020-08-13 DIAGNOSIS — Z7901 Long term (current) use of anticoagulants: Secondary | ICD-10-CM | POA: Diagnosis not present

## 2020-08-13 DIAGNOSIS — Z96642 Presence of left artificial hip joint: Secondary | ICD-10-CM | POA: Diagnosis not present

## 2020-08-13 DIAGNOSIS — Z885 Allergy status to narcotic agent status: Secondary | ICD-10-CM | POA: Insufficient documentation

## 2020-08-13 DIAGNOSIS — Z7982 Long term (current) use of aspirin: Secondary | ICD-10-CM | POA: Insufficient documentation

## 2020-08-13 DIAGNOSIS — N1832 Chronic kidney disease, stage 3b: Secondary | ICD-10-CM

## 2020-08-13 HISTORY — PX: CAPD INSERTION: SHX5233

## 2020-08-13 LAB — GLUCOSE, CAPILLARY
Glucose-Capillary: 89 mg/dL (ref 70–99)
Glucose-Capillary: 145 mg/dL — ABNORMAL HIGH (ref 70–99)

## 2020-08-13 SURGERY — LAPAROSCOPIC INSERTION CONTINUOUS AMBULATORY PERITONEAL DIALYSIS  (CAPD) CATHETER
Anesthesia: General

## 2020-08-13 MED ORDER — ONDANSETRON HCL 4 MG/2ML IJ SOLN: 4.0000 mg | Freq: Once | INTRAMUSCULAR | Status: DC | PRN

## 2020-08-13 MED ORDER — PROPOFOL 10 MG/ML IV BOLUS
INTRAVENOUS | Status: DC | PRN
  Administered 2020-08-13: 150 mg via INTRAVENOUS

## 2020-08-13 MED ORDER — FENTANYL CITRATE (PF) 100 MCG/2ML IJ SOLN
INTRAMUSCULAR | Status: DC | PRN
  Administered 2020-08-13 (×2): 50 ug via INTRAVENOUS

## 2020-08-13 MED ORDER — CHLORHEXIDINE GLUCONATE 0.12 % MT SOLN
OROMUCOSAL | Status: AC
  Administered 2020-08-13: 15 mL via OROMUCOSAL
  Filled 2020-08-13: qty 15

## 2020-08-13 MED ORDER — DEXAMETHASONE SODIUM PHOSPHATE 10 MG/ML IJ SOLN
INTRAMUSCULAR | Status: DC | PRN
  Administered 2020-08-13: 10 mg via INTRAVENOUS

## 2020-08-13 MED ORDER — HYDROCODONE-ACETAMINOPHEN 5-325 MG PO TABS
ORAL_TABLET | ORAL | Status: AC
  Filled 2020-08-13: qty 1

## 2020-08-13 MED ORDER — HYDROCODONE-ACETAMINOPHEN 5-325 MG PO TABS: 1.0000 | ORAL_TABLET | Freq: Four times a day (QID) | ORAL | 0 refills | Status: DC | PRN

## 2020-08-13 MED ORDER — BUPIVACAINE-EPINEPHRINE (PF) 0.25% -1:200000 IJ SOLN
INTRAMUSCULAR | Status: DC | PRN
  Administered 2020-08-13: 23 mL

## 2020-08-13 MED ORDER — LIDOCAINE HCL (CARDIAC) PF 100 MG/5ML IV SOSY
PREFILLED_SYRINGE | INTRAVENOUS | Status: DC | PRN
  Administered 2020-08-13: 100 mg via INTRAVENOUS

## 2020-08-13 MED ORDER — SUGAMMADEX SODIUM 200 MG/2ML IV SOLN
INTRAVENOUS | Status: DC | PRN
  Administered 2020-08-13: 300 mg via INTRAVENOUS

## 2020-08-13 MED ORDER — MEPERIDINE HCL 25 MG/ML IJ SOLN: 6.2500 mg | INTRAMUSCULAR | Status: DC | PRN

## 2020-08-13 MED ORDER — FENTANYL CITRATE (PF) 100 MCG/2ML IJ SOLN
25.0000 ug | INTRAMUSCULAR | Status: DC | PRN
  Administered 2020-08-13: 25 ug via INTRAVENOUS

## 2020-08-13 MED ORDER — FENTANYL CITRATE (PF) 100 MCG/2ML IJ SOLN
INTRAMUSCULAR | Status: AC
  Filled 2020-08-13: qty 2

## 2020-08-13 MED ORDER — ONDANSETRON HCL 4 MG/2ML IJ SOLN
INTRAMUSCULAR | Status: DC | PRN
  Administered 2020-08-13: 4 mg via INTRAVENOUS

## 2020-08-13 MED ORDER — ROCURONIUM BROMIDE 100 MG/10ML IV SOLN
INTRAVENOUS | Status: DC | PRN
  Administered 2020-08-13: 50 mg via INTRAVENOUS

## 2020-08-13 MED ORDER — PHENYLEPHRINE HCL (PRESSORS) 10 MG/ML IV SOLN
INTRAVENOUS | Status: DC | PRN
  Administered 2020-08-13: 100 ug via INTRAVENOUS

## 2020-08-13 MED ORDER — ACETAMINOPHEN 500 MG PO TABS
ORAL_TABLET | ORAL | Status: AC
  Administered 2020-08-13: 1000 mg via ORAL
  Filled 2020-08-13: qty 2

## 2020-08-13 MED ORDER — SODIUM CHLORIDE 0.9 % IR SOLN
Status: DC | PRN
Start: 2020-08-13 — End: 2020-08-13
  Administered 2020-08-13: 1000 mL

## 2020-08-13 MED ORDER — BUPIVACAINE-EPINEPHRINE (PF) 0.25% -1:200000 IJ SOLN
INTRAMUSCULAR | Status: AC
  Filled 2020-08-13: qty 30

## 2020-08-13 MED ORDER — PROPOFOL 10 MG/ML IV BOLUS
INTRAVENOUS | Status: AC
  Filled 2020-08-13: qty 20

## 2020-08-13 MED ORDER — ORAL CARE MOUTH RINSE: 15.0000 mL | Freq: Once | OROMUCOSAL | Status: AC

## 2020-08-13 MED ORDER — CEFAZOLIN SODIUM-DEXTROSE 2-4 GM/100ML-% IV SOLN
INTRAVENOUS | Status: AC
  Filled 2020-08-13: qty 100

## 2020-08-13 MED ORDER — 0.9 % SODIUM CHLORIDE (POUR BTL) OPTIME
TOPICAL | Status: DC | PRN
  Administered 2020-08-13: 50 mL

## 2020-08-13 MED ORDER — MIDAZOLAM HCL 2 MG/2ML IJ SOLN
INTRAMUSCULAR | Status: AC
  Filled 2020-08-13: qty 2

## 2020-08-13 MED ORDER — HYDROCODONE-ACETAMINOPHEN 5-325 MG PO TABS
1.0000 | ORAL_TABLET | Freq: Four times a day (QID) | ORAL | Status: DC | PRN
  Administered 2020-08-13: 1 via ORAL

## 2020-08-13 MED ORDER — CHLORHEXIDINE GLUCONATE 0.12 % MT SOLN: 15.0000 mL | Freq: Once | OROMUCOSAL | Status: AC

## 2020-08-13 MED ORDER — FENTANYL CITRATE (PF) 100 MCG/2ML IJ SOLN
INTRAMUSCULAR | Status: AC
  Administered 2020-08-13: 25 ug via INTRAVENOUS
  Filled 2020-08-13: qty 2

## 2020-08-13 MED ORDER — GABAPENTIN 300 MG PO CAPS
ORAL_CAPSULE | ORAL | Status: AC
  Administered 2020-08-13: 300 mg via ORAL
  Filled 2020-08-13: qty 1

## 2020-08-13 MED ORDER — MIDAZOLAM HCL 2 MG/2ML IJ SOLN
INTRAMUSCULAR | Status: DC | PRN
  Administered 2020-08-13: 1 mg via INTRAVENOUS

## 2020-08-13 SURGICAL SUPPLY — 46 items
ADAPTER CATH DIALYSIS 4X8 IT L (MISCELLANEOUS) ×2 IMPLANT
ADH SKN CLS APL DERMABOND .7 (GAUZE/BANDAGES/DRESSINGS) ×1
APL PRP STRL LF DISP 70% ISPRP (MISCELLANEOUS) ×1
BIOPATCH WHT 1IN DISK W/4.0 H (GAUZE/BANDAGES/DRESSINGS) ×2 IMPLANT
BLADE CLIPPER SURG (BLADE) ×2 IMPLANT
CANISTER SUCT 1200ML W/VALVE (MISCELLANEOUS) ×2 IMPLANT
CATH EXTENDED DIALYSIS (CATHETERS) ×4 IMPLANT
CHLORAPREP W/TINT 26 (MISCELLANEOUS) ×2 IMPLANT
DEFOGGER SCOPE WARMER CLEARIFY (MISCELLANEOUS) ×2 IMPLANT
DERMABOND ADVANCED (GAUZE/BANDAGES/DRESSINGS) ×1
DERMABOND ADVANCED .7 DNX12 (GAUZE/BANDAGES/DRESSINGS) ×1 IMPLANT
ELECT REM PT RETURN 9FT ADLT (ELECTROSURGICAL) ×2
ELECTRODE REM PT RTRN 9FT ADLT (ELECTROSURGICAL) ×1 IMPLANT
GAUZE 4X4 16PLY ~~LOC~~+RFID DBL (SPONGE) ×2 IMPLANT
GAUZE SPONGE 4X4 12PLY STRL (GAUZE/BANDAGES/DRESSINGS) ×2 IMPLANT
GLOVE SURG ORTHO LTX SZ7.5 (GLOVE) ×2 IMPLANT
GOWN STRL REUS W/ TWL LRG LVL3 (GOWN DISPOSABLE) ×2 IMPLANT
GOWN STRL REUS W/TWL LRG LVL3 (GOWN DISPOSABLE) ×4
GRASPER SUT TROCAR 14GX15 (MISCELLANEOUS) ×2 IMPLANT
IV NS 1000ML (IV SOLUTION) ×2
IV NS 1000ML BAXH (IV SOLUTION) ×1 IMPLANT
KIT TURNOVER KIT A (KITS) ×2 IMPLANT
MANIFOLD NEPTUNE II (INSTRUMENTS) ×2 IMPLANT
MINICAP W/POVIDONE IODINE SOL (MISCELLANEOUS) ×2 IMPLANT
NEEDLE HYPO 22GX1.5 SAFETY (NEEDLE) ×2 IMPLANT
NEEDLE INSUFFLATION 14GA 120MM (NEEDLE) IMPLANT
NS IRRIG 500ML POUR BTL (IV SOLUTION) ×2 IMPLANT
PACK LAP CHOLECYSTECTOMY (MISCELLANEOUS) ×2 IMPLANT
SET CYSTO W/LG BORE CLAMP LF (SET/KITS/TRAYS/PACK) ×2 IMPLANT
SET TRANSFER 6 W/TWIST CLAMP 5 (SET/KITS/TRAYS/PACK) ×2 IMPLANT
SET TUBE SMOKE EVAC HIGH FLOW (TUBING) ×2 IMPLANT
SPONGE DRAIN TRACH 4X4 STRL 2S (GAUZE/BANDAGES/DRESSINGS) IMPLANT
STYLET FALLER (MISCELLANEOUS) ×4 IMPLANT
STYLET FALLER MEDIONICS (MISCELLANEOUS) ×2 IMPLANT
SUT MNCRL 4-0 (SUTURE) ×2
SUT MNCRL 4-0 27XMFL (SUTURE) ×1
SUT PROLENE 0 CT 2 (SUTURE) ×2 IMPLANT
SUT VIC AB 3-0 SH 27 (SUTURE) ×2
SUT VIC AB 3-0 SH 27X BRD (SUTURE) ×1 IMPLANT
SUT VIC AB 4-0 KS 27 (SUTURE) ×2 IMPLANT
SUT VICRYL 0 AB UR-6 (SUTURE) IMPLANT
SUT VICRYL 0 TIES 12 18 (SUTURE) ×2 IMPLANT
SUTURE MNCRL 4-0 27XMF (SUTURE) ×1 IMPLANT
SYS KII FIOS ACCESS ABD 5X100 (TROCAR) ×2
SYSTEM KII FIOS ACES ABD 5X100 (TROCAR) ×1 IMPLANT
TROCAR Z-THREAD OPTICAL 5X100M (TROCAR) ×6 IMPLANT

## 2020-08-13 NOTE — Discharge Instructions (Signed)

## 2020-08-13 NOTE — Anesthesia Postprocedure Evaluation (Signed)
Anesthesia Post Note  Patient: Casey French  Procedure(s) Performed: LAPAROSCOPIC INSERTION CONTINUOUS AMBULATORY PERITONEAL DIALYSIS  (CAPD) CATHETER  Patient location during evaluation: PACU Anesthesia Type: General Level of consciousness: awake and alert Pain management: pain level controlled Vital Signs Assessment: post-procedure vital signs reviewed and stable Respiratory status: spontaneous breathing, nonlabored ventilation, respiratory function stable and patient connected to face mask oxygen Cardiovascular status: blood pressure returned to baseline and stable Postop Assessment: no apparent nausea or vomiting Anesthetic complications: no   No notable events documented.   Last Vitals:  Vitals:   08/13/20 1135 08/13/20 1334  BP: 120/82 (!) 150/87  Pulse: 68 66  Resp: 20 16  Temp: 36.5 C (!) 36 C  SpO2: 98% 97%    Last Pain:  Vitals:   08/13/20 1135  TempSrc: Temporal  PainSc: 0-No pain                 Manasvini Whatley

## 2020-08-13 NOTE — Transfer of Care (Signed)
Immediate Anesthesia Transfer of Care Note  Patient: Casey French  Procedure(s) Performed: LAPAROSCOPIC INSERTION CONTINUOUS AMBULATORY PERITONEAL DIALYSIS  (CAPD) CATHETER  Patient Location: PACU  Anesthesia Type:General  Level of Consciousness: awake, alert  and oriented  Airway & Oxygen Therapy: Patient Spontanous Breathing and Patient connected to face mask oxygen  Post-op Assessment: Post -op Vital signs reviewed and stable  Post vital signs: stable  Last Vitals:  Vitals Value Taken Time  BP 150/87 08/13/20 1334  Temp 36 C 08/13/20 1334  Pulse 66 08/13/20 1335  Resp 17 08/13/20 1335  SpO2 100 % 08/13/20 1335  Vitals shown include unvalidated device data.  Last Pain:  Vitals:   08/13/20 1135  TempSrc: Temporal  PainSc: 0-No pain      Patients Stated Pain Goal: 0 ([id number])  Complications: No notable events documented.

## 2020-08-13 NOTE — Interval H&P Note (Signed)
History and Physical Interval Note:  08/13/2020 11:41 AM  Casey French  has presented today for surgery, with the diagnosis of Stage IV CRF.  The various methods of treatment have been discussed with the patient and family. After consideration of risks, benefits and other options for treatment, the patient has consented to  Procedure(s): [place]  (CAPD) CATHETER (N/A) as a surgical intervention.  The patient's history has been reviewed, patient examined, no change in status, stable for surgery.  I have reviewed the patient's chart and labs.  Questions were answered to the patient's satisfaction.     Ronny Bacon

## 2020-08-13 NOTE — Anesthesia Preprocedure Evaluation (Signed)
Anesthesia Evaluation  Patient identified by MRN, date of birth, ID band Patient awake    Reviewed: Allergy & Precautions, H&P , NPO status , Patient's Chart, lab work & pertinent test results, reviewed documented beta blocker date and time   History of Anesthesia Complications Negative for: history of anesthetic complications  Airway Mallampati: II  TM Distance: >3 FB     Dental  (+) Edentulous Lower, Edentulous Upper   Pulmonary neg sleep apnea, neg COPD, former smoker,    breath sounds clear to auscultation       Cardiovascular hypertension, Pt. on medications and Pt. on home beta blockers + angina (stable angina) with exertion + CAD, + Past MI and + Cardiac Stents  + dysrhythmias Atrial Fibrillation + Valvular Problems/Murmurs (Aortic atherosclerosis )  Rate:Normal     Neuro/Psych PSYCHIATRIC DISORDERS Depression negative neurological ROS     GI/Hepatic Neg liver ROS, GERD  Medicated and Controlled,  Endo/Other  diabetes, Well Controlled, Oral Hypoglycemic Agents  Renal/GU Renal disease (CKD)     Musculoskeletal   Abdominal   Peds  Hematology  (+) Blood dyscrasia, anemia , Hgb 8.3   Anesthesia Other Findings Acute diverticulitis 06/14/2014  Anemia    Anginal pain (HCC)    Aortic atherosclerosis (HCC)   Atrial fibrillation (HCC)    CKD (chronic kidney disease) stage 3, GFR 30-59 ml/min (HCC)   Coronary artery disease   Depression Left ventricular ejection fraction, by estimation, is 60 to 65%. The   Diabetes mellitus without complication ([place])    Diverticulosis    GERD (gastroesophageal reflux disease) GI bleed 05/15/2019   Gout    Heart murmur    Hematemesis 05/15/2019   High cholesterol    Hypertension    STEMI (ST elevation myocardial infarction) ([place]) 2003 Inferior     Reproductive/Obstetrics negative OB ROS                             Anesthesia Physical  Anesthesia  Plan  ASA: 4  Anesthesia Plan: General   Post-op Pain Management:    Induction: Intravenous  PONV Risk Score and Plan: 2 and Propofol infusion and Ondansetron  Airway Management Planned: Oral ETT  Additional Equipment:   Intra-op Plan:   Post-operative Plan: Extubation in OR  Informed Consent: I have reviewed the patients History and Physical, chart, labs and discussed the procedure including the risks, benefits and alternatives for the proposed anesthesia with the patient or authorized representative who has indicated his/her understanding and acceptance.     Dental Advisory Given  Plan Discussed with: Anesthesiologist, CRNA and Surgeon  Anesthesia Plan Comments:         Anesthesia Quick Evaluation

## 2020-08-13 NOTE — Op Note (Signed)
Laparoscopy, continuous ambulatory peritoneal dialysis catheter placement, laparoscopic omentopexy.  Pre-operative Diagnosis: Stage V chronic renal failure.   Post-operative Diagnosis: same.   Surgeon: Ronny Bacon, M.D., [facility]  Anesthesia: General endotracheal  Findings: Excellent dialysate flow, omentopexy left upper quadrant.  Estimated Blood Loss: 5 mL         Specimens: None          Complications: none              Procedure Details  The patient was seen again in the Holding Room. The benefits, complications, treatment options, and expected outcomes were discussed with the patient. The risks of bleeding, infection, recurrence of symptoms, failure to resolve symptoms, unanticipated injury, prosthetic placement, prosthetic infection, any of which could require further surgery were reviewed with the patient. The likelihood of improving the patient's symptoms with return to their baseline status is good.  The patient and/or family concurred with the proposed plan, giving informed consent.  The patient was taken to Operating Room, identified and the procedure verified.    Prior to the induction of general anesthesia, antibiotic prophylaxis was administered. VTE prophylaxis was in place.  General anesthesia was then administered and tolerated well. After the induction, the patient was positioned in the supine position and the abdomen was prepped with Chloraprep and draped in the sterile fashion.  A Time Out was held and the above information confirmed. After local infiltration of quarter percent Marcaine with epinephrine, stab incision was made left upper quadrant.  Just below the costal margin approximately midclavicular line the Veress needle is passed with sensation of the layers to penetrate the abdominal wall and into the peritoneum.  Saline drop test is confirmed peritoneal placement.  Insufflation is initiated with carbon dioxide to pressures of 15 mmHg. An optical 5 mm trocar was  placed under direct visualization in the right upper quadrant.  A second trocar was placed under direct visualization inferiorly on the right side.  With local infiltration of quarter percent Marcaine with epinephrine a transverse incision was made just cephalad to the umbilical level on the left patient's left rectus.  This allowed the top of the coil to align with the symphysis pubis.    Utilizing a 5 mm applied optical port, I passed the trocar first through the anterior rectus sheath and then tangentially within the rectus sheath the maximal distance prior to penetrating into the peritoneal cavity, all under direct visualization. I then placed the coil of the peritoneal dialysis catheter through this port into the pelvis and advanced the cuff under the anterior rectus sheath.  I then measured the angled catheter for the length to rise and make a lateral turn inferior to the left costal margin, and then to extend laterally and somewhat inferiorly from that point. I made a counterincision where the middle cuff would lie, connected the catheters, just cut after measuring and secured the catheters to the connector with 0-Prolene.  I proceeded with the omentopexy identifying a generous omentum and utilizing a PMI and a 0 Vicryl suture secured a wide swath of the free omentum to the left upper quadrant and secured it to the anterior abdominal wall. I then tunneled the catheter to the counterincision.  Ensuring the catheter remained without kinks or twists along the subcutaneous course.  I then tracked it in the same subcutaneous plane laterally parallel to the costal margin for skin penetration with the trocar alone.  The catheter was then brought out through that site. We then evaluated the  inflow of the dialysate and its gravitational drainage.  This was free flowing.   Once this was completed we then removed the remaining trochars, along with the CO2, and closed the skin incisions with interrupted and running  subcuticulars of 4-0 Monocryl, sealing them all with Dermabond. A biodisk was applied to the catheter exit site, and it was dressed with drain sponge and tape.       Ronny Bacon M.D., [facility] 06/10/2020 9:13 AM

## 2020-08-13 NOTE — Anesthesia Postprocedure Evaluation (Signed)
Anesthesia Post Note  Patient: MILFERD MALLARD  Procedure(s) Performed: LAPAROSCOPIC INSERTION CONTINUOUS AMBULATORY PERITONEAL DIALYSIS  (CAPD) CATHETER  Patient location during evaluation: PACU Anesthesia Type: General Level of consciousness: awake and alert, awake and oriented Pain management: pain level controlled Vital Signs Assessment: post-procedure vital signs reviewed and stable Respiratory status: spontaneous breathing, nonlabored ventilation and respiratory function stable Cardiovascular status: blood pressure returned to baseline and stable Postop Assessment: no apparent nausea or vomiting Anesthetic complications: no   No notable events documented.   Last Vitals:  Vitals:   08/13/20 1334 08/13/20 1345  BP: (!) 150/87 124/72  Pulse: 66 64  Resp: 16 15  Temp: (!) 36 C   SpO2: 97% 100%    Last Pain:  Vitals:   08/13/20 1345  TempSrc:   PainSc: 6                  Phill Mutter

## 2020-08-13 NOTE — Anesthesia Procedure Notes (Signed)
Procedure Name: Intubation Date/Time: 08/13/2020 12:27 PM Performed by: Phill Mutter, MD Pre-anesthesia Checklist: Patient identified, Patient being monitored, Timeout performed, Emergency Drugs available and Suction available Patient Re-evaluated:Patient Re-evaluated prior to induction Oxygen Delivery Method: Circle system utilized Preoxygenation: Pre-oxygenation with 100% oxygen Induction Type: IV induction Ventilation: Mask ventilation without difficulty Laryngoscope Size: 3 and Glidescope Grade View: Grade I Tube type: Oral Tube size: 7.5 mm Number of attempts: 1 Airway Equipment and Method: Stylet Placement Confirmation: ETT inserted through vocal cords under direct vision, positive ETCO2 and breath sounds checked- equal and bilateral Secured at: 22 cm Tube secured with: Tape Dental Injury: Teeth and Oropharynx as per pre-operative assessment

## 2020-08-14 ENCOUNTER — Telehealth: Payer: Self-pay

## 2020-08-14 ENCOUNTER — Encounter: Payer: Self-pay | Admitting: Surgery

## 2020-08-14 NOTE — Telephone Encounter (Signed)
Received cardiac clearance from Dr. Lorinda Creed. Pt's procedure risk assessment is low. Pt may hold Eliquis 3 days prior to procedure and ASA 7 days prior to procedure.

## 2020-08-30 ENCOUNTER — Other Ambulatory Visit: Payer: Self-pay | Admitting: *Deleted

## 2020-08-30 DIAGNOSIS — N184 Chronic kidney disease, stage 4 (severe): Secondary | ICD-10-CM

## 2020-08-30 DIAGNOSIS — D631 Anemia in chronic kidney disease: Secondary | ICD-10-CM

## 2020-09-06 ENCOUNTER — Other Ambulatory Visit: Payer: Self-pay | Admitting: Oncology

## 2020-09-12 ENCOUNTER — Other Ambulatory Visit: Payer: Medicare PPO

## 2020-09-12 ENCOUNTER — Other Ambulatory Visit: Payer: Self-pay

## 2020-09-12 ENCOUNTER — Inpatient Hospital Stay: Payer: Medicare PPO | Attending: Nurse Practitioner

## 2020-09-12 DIAGNOSIS — N184 Chronic kidney disease, stage 4 (severe): Secondary | ICD-10-CM | POA: Insufficient documentation

## 2020-09-12 DIAGNOSIS — D508 Other iron deficiency anemias: Secondary | ICD-10-CM | POA: Diagnosis present

## 2020-09-12 DIAGNOSIS — D631 Anemia in chronic kidney disease: Secondary | ICD-10-CM | POA: Insufficient documentation

## 2020-09-12 LAB — CBC
HCT: 22.6 % — ABNORMAL LOW (ref 39.0–52.0)
Hemoglobin: 7.3 g/dL — ABNORMAL LOW (ref 13.0–17.0)
MCH: 30.3 pg (ref 26.0–34.0)
MCHC: 32.3 g/dL (ref 30.0–36.0)
MCV: 93.8 fL (ref 80.0–100.0)
Platelets: 215 10*3/uL (ref 150–400)
RBC: 2.41 MIL/uL — ABNORMAL LOW (ref 4.22–5.81)
RDW: 14.5 % (ref 11.5–15.5)
WBC: 5.3 10*3/uL (ref 4.0–10.5)
nRBC: 0 % (ref 0.0–0.2)

## 2020-09-12 LAB — IRON AND TIBC
Iron: 92 ug/dL (ref 45–182)
Saturation Ratios: 49 % — ABNORMAL HIGH (ref 17.9–39.5)
TIBC: 186 ug/dL — ABNORMAL LOW (ref 250–450)
UIBC: 94 ug/dL

## 2020-09-12 LAB — VITAMIN B12: Vitamin B-12: 540 pg/mL (ref 180–914)

## 2020-09-12 LAB — FERRITIN: Ferritin: 296 ng/mL (ref 24–336)

## 2020-09-17 ENCOUNTER — Ambulatory Visit: Payer: Medicare PPO

## 2020-09-17 ENCOUNTER — Ambulatory Visit: Payer: Medicare PPO | Admitting: Oncology

## 2020-09-23 ENCOUNTER — Encounter: Payer: Self-pay | Admitting: Oncology

## 2020-09-23 ENCOUNTER — Inpatient Hospital Stay: Payer: Medicare PPO | Attending: Oncology | Admitting: Oncology

## 2020-09-23 ENCOUNTER — Inpatient Hospital Stay: Payer: Medicare PPO

## 2020-09-23 VITALS — BP 116/71 | HR 83 | Temp 97.6°F | Resp 17 | Wt 153.0 lb

## 2020-09-23 DIAGNOSIS — N184 Chronic kidney disease, stage 4 (severe): Secondary | ICD-10-CM | POA: Diagnosis present

## 2020-09-23 DIAGNOSIS — D649 Anemia, unspecified: Secondary | ICD-10-CM

## 2020-09-23 DIAGNOSIS — E538 Deficiency of other specified B group vitamins: Secondary | ICD-10-CM

## 2020-09-23 DIAGNOSIS — D631 Anemia in chronic kidney disease: Secondary | ICD-10-CM

## 2020-09-23 DIAGNOSIS — Z79899 Other long term (current) drug therapy: Secondary | ICD-10-CM

## 2020-09-23 MED ORDER — EPOETIN ALFA-EPBX 40000 UNIT/ML IJ SOLN
40000.0000 [IU] | INTRAMUSCULAR | Status: DC
  Administered 2020-09-23: 40000 [IU] via SUBCUTANEOUS
  Filled 2020-09-23: qty 1

## 2020-09-23 NOTE — Progress Notes (Signed)
Patient here for oncology follow-up appointment, expresses no complaints or concerns at this time.    

## 2020-09-23 NOTE — Progress Notes (Signed)
Patient tolerated retracrit injection today, no concerns voiced. Patient discharged, stable.

## 2020-09-24 ENCOUNTER — Ambulatory Visit: Payer: Medicare PPO

## 2020-09-24 ENCOUNTER — Ambulatory Visit: Payer: Medicare PPO | Admitting: Oncology

## 2020-09-28 ENCOUNTER — Encounter: Payer: Self-pay | Admitting: Oncology

## 2020-09-28 NOTE — Progress Notes (Signed)
Hematology/Oncology Consult note [facility]  Telephone:([phone] [phone]  Patient Care Team: [name], MD as PCP - General (Family Medicine) Ubaldo Glassing Javier Docker, MD as Consulting Physician (Cardiology) Isaias Cowman, MD as Consulting Physician (Cardiology)   Name of the patient: Casey French  [id number]  [birth year]   Date of visit: 09/28/20  Diagnosis-normocytic anemia multifactorial secondary to iron deficiency and anemia of chronic kidney disease  Chief complaint/ Reason for visit-routine follow-up of anemia  Heme/Onc history: Patient is a 70 year old male with a past medical history significant for stage IV CKD hypertension hyperlipidemia type 2 diabetes and atrial fibrillation.  He follows up with Dr. Zollie Scale.  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  Patient did not want to take steroids due to his diabetes.  He is on rituxan for FSGS   He was also referred to [place] for anemia and results of anemia work-up were as follows: White count and platelets were normal and H&H was 7.8/23.9.  Folate levels were low at 5.7, B12 low at 207.  Ferritin levels at 129 and iron studies showed low TIBC of 217 with an iron saturation of 30%.  Myeloma panel did not show any M protein.  Haptoglobin was normal and TSH was normal.    Patient received 2 doses of Venofer as well as B12 injections in June 2022.    Interval history-patient is currently in the process of starting home peritoneal dialysis.  He reports baseline fatigue.  Denies any new complaints at this time.  ECOG PS- 2 Pain scale- 0   Review of systems- Review of Systems  Constitutional:  Positive for malaise/fatigue. Negative for chills, fever and weight loss.  HENT:  Negative for congestion, ear discharge and nosebleeds.   Eyes:  Negative for blurred vision.  Respiratory:  Negative for cough, hemoptysis, sputum  production, shortness of breath and wheezing.   Cardiovascular:  Negative for chest pain, palpitations, orthopnea and claudication.  Gastrointestinal:  Negative for abdominal pain, blood in stool, constipation, diarrhea, heartburn, melena, nausea and vomiting.  Genitourinary:  Negative for dysuria, flank pain, frequency, hematuria and urgency.  Musculoskeletal:  Negative for back pain, joint pain and myalgias.  Skin:  Negative for rash.  Neurological:  Negative for dizziness, tingling, focal weakness, seizures, weakness and headaches.  Endo/Heme/Allergies:  Does not bruise/bleed easily.  Psychiatric/Behavioral:  Negative for depression and suicidal ideas. The patient does not have insomnia.      Allergies  Allergen Reactions   Morphine Anxiety    agitation      Past Medical History:  Diagnosis Date   Acute diverticulitis 06/14/2014   Anemia    Anginal pain (HCC)    Aortic atherosclerosis (HCC)    Atrial fibrillation (HCC)    CKD (chronic kidney disease) stage 3, GFR 30-59 ml/min (HCC)    Coronary artery disease    Depression    Diabetes mellitus without complication (HCC)    Diverticulosis    GERD (gastroesophageal reflux disease)    GI bleed 05/15/2019   Gout    Heart murmur    Hematemesis 05/15/2019   High cholesterol    Hypertension    STEMI (ST elevation myocardial infarction) ([place]) 2003   Inferior     Past Surgical History:  Procedure Laterality Date   CAPD INSERTION N/A 08/13/2020   Procedure: LAPAROSCOPIC INSERTION CONTINUOUS AMBULATORY PERITONEAL DIALYSIS  (CAPD) CATHETER;  Surgeon: Ronny Bacon, MD;  Location: [facility] ORS;  Service: General;  Laterality: N/A;   CARDIAC CATHETERIZATION     CATARACT EXTRACTION W/PHACO Right 07/05/2019   Procedure: CATARACT EXTRACTION PHACO AND INTRAOCULAR LENS PLACEMENT ([place]) RIGHT DIABETIC;  Surgeon: Birder Robson, MD;  Location: [facility] ORS;  Service: Ophthalmology;  Laterality: Right;  US00:59.3 CDE8.41 LOT [id number]    COLONOSCOPY WITH PROPOFOL N/A 04/01/2017   Procedure: COLONOSCOPY WITH PROPOFOL;  Surgeon: Lollie Sails, MD;  [facility] ENDOSCOPY;  Service: Endoscopy;  Laterality: N/A;   CORONARY ANGIOPLASTY WITH STENT PLACEMENT  10/2001   RCA   CORONARY ANGIOPLASTY WITH STENT PLACEMENT  07/2015   LAD   right hand surgery  2015   3 screws in right hand   TOTAL HIP ARTHROPLASTY Left 04/22/2020   Procedure: TOTAL HIP ARTHROPLASTY ANTERIOR APPROACH;  Surgeon: Hessie Knows, MD;  Location: [facility] ORS;  Service: Orthopedics;  Laterality: Left;    Social History   Socioeconomic History   Marital status: Married    Spouse name: Sherri   Number of children: 2   Years of education: Not on file   Highest education level: Not on file  Occupational History   Not on file  Tobacco Use   Smoking status: Former    Types: Cigarettes    Quit date: 03/24/2001    Years since quitting: 19.5   Smokeless tobacco: Never  Vaping Use   Vaping Use: Never used  Substance and Sexual Activity   Alcohol use: No    Alcohol/week: 0.0 standard drinks   Drug use: No   Sexual activity: Not on file  Other Topics Concern   Not on file  Social History Narrative   Not on file   Social Determinants of Health   Financial Resource Strain: Not on file  Food Insecurity: Not on file  Transportation Needs: Not on file  Physical Activity: Not on file  Stress: Not on file  Social Connections: Not on file  Intimate Partner Violence: Not on file    Family History  Problem Relation Age of Onset   Heart disease Mother    Heart disease Father    CAD Other      Current Outpatient Medications:    allopurinol (ZYLOPRIM) 100 MG tablet, Take 100 mg by mouth daily., Disp: , Rfl:    amLODipine (NORVASC) 10 MG tablet, Take 10 mg by mouth daily., Disp: , Rfl:    apixaban (ELIQUIS) 2.5 MG TABS tablet, Take 2.5 mg by mouth 2 (two) times daily., Disp: , Rfl:    aspirin 81 MG EC tablet, Take 81 mg by mouth daily., Disp: , Rfl:     calcitRIOL (ROCALTROL) 0.25 MCG capsule, Take 0.25 mcg by mouth daily., Disp: , Rfl:    cloNIDine (CATAPRES) 0.1 MG tablet, Take 1 tablet (0.1 mg total) by mouth 2 (two) times daily., Disp: 60 tablet, Rfl: 0   fexofenadine (ALLEGRA) 180 MG tablet, Take 180 mg by mouth daily as needed for allergies or rhinitis., Disp: , Rfl:    fluticasone (FLONASE) 50 MCG/ACT nasal spray, Place 2 sprays into both nostrils daily as needed for allergies., Disp: , Rfl:    folic acid (FOLVITE) 1 MG tablet, TAKE 2 TABLETS(2 MG) BY MOUTH DAILY, Disp: 180 tablet, Rfl: 2   gabapentin (NEURONTIN) 300 MG capsule, Take 300 mg by mouth at bedtime., Disp: , Rfl: 11   HYDROcodone-acetaminophen (NORCO/VICODIN) 5-325 MG tablet, Take 1 tablet by mouth every 6 (six) hours as needed for moderate pain., Disp: 15 tablet, Rfl: 0   hydrocortisone (ANUSOL-HC)  2.5 % rectal cream, Place 1 application rectally 2 (two) times daily as needed for hemorrhoids or anal itching., Disp: , Rfl:    isosorbide mononitrate (IMDUR) 60 MG 24 hr tablet, Take 60 mg by mouth daily., Disp: , Rfl:    lisinopril (ZESTRIL) 20 MG tablet, Take 20 mg by mouth daily., Disp: , Rfl:    losartan (COZAAR) 100 MG tablet, Take 100 mg by mouth daily., Disp: , Rfl:    metoprolol succinate (TOPROL-XL) 100 MG 24 hr tablet, Take 150 mg by mouth daily. Take with or immediately following a meal., Disp: , Rfl:    nitroGLYCERIN (NITROSTAT) 0.4 MG SL tablet, Place 0.4 mg under the tongue every 5 (five) minutes as needed for chest pain., Disp: , Rfl:    omeprazole (PRILOSEC) 20 MG capsule, Take 20 mg by mouth daily., Disp: , Rfl:    psyllium (METAMUCIL) 58.6 % packet, Take 1 packet by mouth 2 (two) times daily., Disp: , Rfl:    rosuvastatin (CRESTOR) 40 MG tablet, Take 40 mg by mouth daily., Disp: , Rfl:    torsemide (DEMADEX) 10 MG tablet, Take 1 tablet (10 mg total) by mouth daily. Hold this medication until you see your nephrologist, Disp: , Rfl:    traMADol (ULTRAM) 50 MG  tablet, Take 1 tablet (50 mg total) by mouth every 6 (six) hours., Disp: 30 tablet, Rfl: 0  Physical exam:  Vitals:   09/23/20 1511  BP: 116/71  Pulse: 83  Resp: 17  Temp: 97.6 F (36.4 C)  TempSrc: Tympanic  SpO2: 99%  Weight: 153 lb (69.4 kg)   Physical Exam Cardiovascular:     Rate and Rhythm: Normal rate and regular rhythm.     Heart sounds: Normal heart sounds.  Pulmonary:     Effort: Pulmonary effort is normal.     Breath sounds: Normal breath sounds.  Abdominal:     General: Bowel sounds are normal.     Palpations: Abdomen is soft.  Musculoskeletal:     Right lower leg: Edema present.     Left lower leg: Edema present.  Skin:    General: Skin is warm and dry.  Neurological:     Mental Status: He is alert and oriented to person, place, and time.     CMP Latest Ref Rng & Units 06/18/2020  Glucose 70 - 99 mg/dL 86  BUN 8 - 23 mg/dL 35(H)  Creatinine 0.61 - 1.24 mg/dL 3.85(H)  Sodium 135 - 145 mmol/L 139  Potassium 3.5 - 5.1 mmol/L 4.2  Chloride 98 - 111 mmol/L 114(H)  CO2 22 - 32 mmol/L 21(L)  Calcium 8.9 - 10.3 mg/dL 7.5(L)  Total Protein 6.5 - 8.1 g/dL 5.5(L)  Total Bilirubin 0.3 - 1.2 mg/dL 0.2(L)  Alkaline Phos 38 - 126 U/L 120  AST 15 - 41 U/L 11(L)  ALT 0 - 44 U/L 9   CBC Latest Ref Rng & Units 09/12/2020  WBC 4.0 - 10.5 K/uL 5.3  Hemoglobin 13.0 - 17.0 g/dL 7.3(L)  Hematocrit 39.0 - 52.0 % 22.6(L)  Platelets 150 - 400 K/uL 215     Assessment and plan- Patient is a 70 y.o. 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  Ferritin levels up to 96 and iron saturation of 49%.  B12 level 540.  He is therefore not overtly iron deficient or B12 deficient.  Goal ferritin  with chronic kidney disease on dialysis would be closer to 200 with an iron saturation of greater than 20%.  He therefore does not require any IV iron at this time.   Given that he has significant anemia despite receiving IV iron and B12 in the past I will proceed with Retacrit at this time which she will receive at 40,000 units every 3 weeks.  Goal hemoglobin is to keep it between 10-11.  Discussed risks and benefits of Retacrit including all but not limited to possible risk of thromboembolic events strokes and heart attacks especially when hemoglobin is At a higher level of greater than 12.  Patient will continue receiving Retacrit every 3 weeks and I will see him back in 3 months with CBC with differential ferritin and iron studies   Visit Diagnosis 1. Anemia of chronic kidney failure, stage 4 (severe) ([place])   2. B12 deficiency   3. Erythropoietin (EPO) stimulating agent anemia management patient      Dr. Randa Evens, MD, MPH [facility] [id number] 09/28/2020 10:15 AM

## 2020-10-14 ENCOUNTER — Inpatient Hospital Stay: Payer: Medicare PPO | Attending: Internal Medicine

## 2020-10-14 ENCOUNTER — Inpatient Hospital Stay: Payer: Medicare PPO

## 2020-10-14 VITALS — BP 92/54 | HR 80

## 2020-10-14 DIAGNOSIS — D631 Anemia in chronic kidney disease: Secondary | ICD-10-CM | POA: Insufficient documentation

## 2020-10-14 DIAGNOSIS — E538 Deficiency of other specified B group vitamins: Secondary | ICD-10-CM

## 2020-10-14 DIAGNOSIS — N184 Chronic kidney disease, stage 4 (severe): Secondary | ICD-10-CM | POA: Diagnosis not present

## 2020-10-14 LAB — HEMATOCRIT: HCT: 29.5 % — ABNORMAL LOW (ref 39.0–52.0)

## 2020-10-14 LAB — HEMOGLOBIN: Hemoglobin: 9.1 g/dL — ABNORMAL LOW (ref 13.0–17.0)

## 2020-10-14 MED ORDER — EPOETIN ALFA-EPBX 40000 UNIT/ML IJ SOLN
40000.0000 [IU] | INTRAMUSCULAR | Status: DC
  Administered 2020-10-14: 40000 [IU] via SUBCUTANEOUS
  Filled 2020-10-14: qty 1

## 2020-10-15 ENCOUNTER — Encounter: Payer: Self-pay | Admitting: Oncology

## 2020-11-04 ENCOUNTER — Inpatient Hospital Stay: Payer: Medicare PPO

## 2020-11-04 ENCOUNTER — Other Ambulatory Visit: Payer: Self-pay

## 2020-11-04 ENCOUNTER — Inpatient Hospital Stay: Payer: Medicare PPO | Attending: Internal Medicine

## 2020-11-04 VITALS — BP 129/78 | HR 71

## 2020-11-04 DIAGNOSIS — E538 Deficiency of other specified B group vitamins: Secondary | ICD-10-CM

## 2020-11-04 DIAGNOSIS — D631 Anemia in chronic kidney disease: Secondary | ICD-10-CM | POA: Diagnosis present

## 2020-11-04 DIAGNOSIS — N184 Chronic kidney disease, stage 4 (severe): Secondary | ICD-10-CM | POA: Diagnosis not present

## 2020-11-04 LAB — HEMOGLOBIN: Hemoglobin: 9.7 g/dL — ABNORMAL LOW (ref 13.0–17.0)

## 2020-11-04 LAB — HEMATOCRIT: HCT: 30.9 % — ABNORMAL LOW (ref 39.0–52.0)

## 2020-11-04 MED ORDER — EPOETIN ALFA-EPBX 40000 UNIT/ML IJ SOLN
40000.0000 [IU] | INTRAMUSCULAR | Status: DC
  Administered 2020-11-04: 40000 [IU] via SUBCUTANEOUS
  Filled 2020-11-04: qty 1

## 2020-11-25 ENCOUNTER — Other Ambulatory Visit: Payer: Self-pay

## 2020-11-25 ENCOUNTER — Inpatient Hospital Stay: Payer: Medicare PPO | Attending: Internal Medicine

## 2020-11-25 ENCOUNTER — Inpatient Hospital Stay: Payer: Medicare PPO

## 2020-11-25 DIAGNOSIS — I4891 Unspecified atrial fibrillation: Secondary | ICD-10-CM | POA: Diagnosis not present

## 2020-11-25 DIAGNOSIS — Z7982 Long term (current) use of aspirin: Secondary | ICD-10-CM | POA: Diagnosis not present

## 2020-11-25 DIAGNOSIS — E538 Deficiency of other specified B group vitamins: Secondary | ICD-10-CM

## 2020-11-25 DIAGNOSIS — Z992 Dependence on renal dialysis: Secondary | ICD-10-CM | POA: Diagnosis not present

## 2020-11-25 DIAGNOSIS — Z87891 Personal history of nicotine dependence: Secondary | ICD-10-CM | POA: Diagnosis not present

## 2020-11-25 DIAGNOSIS — D631 Anemia in chronic kidney disease: Secondary | ICD-10-CM | POA: Insufficient documentation

## 2020-11-25 DIAGNOSIS — Z7901 Long term (current) use of anticoagulants: Secondary | ICD-10-CM | POA: Insufficient documentation

## 2020-11-25 DIAGNOSIS — N184 Chronic kidney disease, stage 4 (severe): Secondary | ICD-10-CM | POA: Diagnosis not present

## 2020-11-25 DIAGNOSIS — E119 Type 2 diabetes mellitus without complications: Secondary | ICD-10-CM | POA: Diagnosis not present

## 2020-11-25 DIAGNOSIS — Z79899 Other long term (current) drug therapy: Secondary | ICD-10-CM | POA: Insufficient documentation

## 2020-11-25 LAB — HEMATOCRIT: HCT: 41.2 % (ref 39.0–52.0)

## 2020-11-25 LAB — HEMOGLOBIN: Hemoglobin: 13.2 g/dL (ref 13.0–17.0)

## 2020-12-02 ENCOUNTER — Encounter: Payer: Self-pay | Admitting: Oncology

## 2020-12-02 ENCOUNTER — Observation Stay
Admission: EM | Admit: 2020-12-02 | Discharge: 2020-12-03 | Disposition: A | Discharge status: Home / Self Care | Payer: Medicare PPO | Attending: Internal Medicine | Admitting: Internal Medicine

## 2020-12-02 ENCOUNTER — Other Ambulatory Visit: Payer: Self-pay

## 2020-12-02 ENCOUNTER — Emergency Department: Payer: Medicare PPO

## 2020-12-02 DIAGNOSIS — Z992 Dependence on renal dialysis: Secondary | ICD-10-CM | POA: Insufficient documentation

## 2020-12-02 DIAGNOSIS — Z79899 Other long term (current) drug therapy: Secondary | ICD-10-CM | POA: Diagnosis not present

## 2020-12-02 DIAGNOSIS — E785 Hyperlipidemia, unspecified: Secondary | ICD-10-CM | POA: Diagnosis present

## 2020-12-02 DIAGNOSIS — K219 Gastro-esophageal reflux disease without esophagitis: Secondary | ICD-10-CM | POA: Diagnosis present

## 2020-12-02 DIAGNOSIS — Z7901 Long term (current) use of anticoagulants: Secondary | ICD-10-CM | POA: Insufficient documentation

## 2020-12-02 DIAGNOSIS — M109 Gout, unspecified: Secondary | ICD-10-CM | POA: Diagnosis present

## 2020-12-02 DIAGNOSIS — I160 Hypertensive urgency: Secondary | ICD-10-CM | POA: Diagnosis not present

## 2020-12-02 DIAGNOSIS — I482 Chronic atrial fibrillation, unspecified: Secondary | ICD-10-CM | POA: Diagnosis present

## 2020-12-02 DIAGNOSIS — N186 End stage renal disease: Secondary | ICD-10-CM

## 2020-12-02 DIAGNOSIS — Z96642 Presence of left artificial hip joint: Secondary | ICD-10-CM | POA: Diagnosis not present

## 2020-12-02 DIAGNOSIS — Z7982 Long term (current) use of aspirin: Secondary | ICD-10-CM | POA: Diagnosis not present

## 2020-12-02 DIAGNOSIS — I10 Essential (primary) hypertension: Secondary | ICD-10-CM | POA: Diagnosis present

## 2020-12-02 DIAGNOSIS — I2510 Atherosclerotic heart disease of native coronary artery without angina pectoris: Secondary | ICD-10-CM | POA: Diagnosis present

## 2020-12-02 DIAGNOSIS — I120 Hypertensive chronic kidney disease with stage 5 chronic kidney disease or end stage renal disease: Secondary | ICD-10-CM | POA: Insufficient documentation

## 2020-12-02 DIAGNOSIS — Z87891 Personal history of nicotine dependence: Secondary | ICD-10-CM | POA: Diagnosis not present

## 2020-12-02 DIAGNOSIS — Z20822 Contact with and (suspected) exposure to covid-19: Secondary | ICD-10-CM | POA: Diagnosis not present

## 2020-12-02 DIAGNOSIS — R079 Chest pain, unspecified: Secondary | ICD-10-CM | POA: Diagnosis not present

## 2020-12-02 LAB — CBC WITH DIFFERENTIAL/PLATELET
Abs Immature Granulocytes: 0.04 10*3/uL (ref 0.00–0.07)
Basophils Absolute: 0 10*3/uL (ref 0.0–0.1)
Basophils Relative: 1 %
Eosinophils Absolute: 0.1 10*3/uL (ref 0.0–0.5)
Eosinophils Relative: 2 %
HCT: 38.9 % — ABNORMAL LOW (ref 39.0–52.0)
Hemoglobin: 12.7 g/dL — ABNORMAL LOW (ref 13.0–17.0)
Immature Granulocytes: 1 %
Lymphocytes Relative: 20 %
Lymphs Abs: 1.4 10*3/uL (ref 0.7–4.0)
MCH: 31.4 pg (ref 26.0–34.0)
MCHC: 32.6 g/dL (ref 30.0–36.0)
MCV: 96 fL (ref 80.0–100.0)
Monocytes Absolute: 0.8 10*3/uL (ref 0.1–1.0)
Monocytes Relative: 11 %
Neutro Abs: 4.5 10*3/uL (ref 1.7–7.7)
Neutrophils Relative %: 65 %
Platelets: 238 10*3/uL (ref 150–400)
RBC: 4.05 MIL/uL — ABNORMAL LOW (ref 4.22–5.81)
RDW: 14 % (ref 11.5–15.5)
WBC: 6.9 10*3/uL (ref 4.0–10.5)
nRBC: 0 % (ref 0.0–0.2)

## 2020-12-02 LAB — COMPREHENSIVE METABOLIC PANEL
ALT: 17 U/L (ref 0–44)
AST: 36 U/L (ref 15–41)
Albumin: 2.8 g/dL — ABNORMAL LOW (ref 3.5–5.0)
Alkaline Phosphatase: 108 U/L (ref 38–126)
Anion gap: 11 (ref 5–15)
BUN: 40 mg/dL — ABNORMAL HIGH (ref 8–23)
CO2: 21 mmol/L — ABNORMAL LOW (ref 22–32)
Calcium: 8.6 mg/dL — ABNORMAL LOW (ref 8.9–10.3)
Chloride: 106 mmol/L (ref 98–111)
Creatinine, Ser: 4.93 mg/dL — ABNORMAL HIGH (ref 0.61–1.24)
GFR, Estimated: 12 mL/min — ABNORMAL LOW (ref >60)
Glucose, Bld: 109 mg/dL — ABNORMAL HIGH (ref 70–99)
Potassium: 3.5 mmol/L (ref 3.5–5.1)
Sodium: 138 mmol/L (ref 135–145)
Total Bilirubin: 0.6 mg/dL (ref 0.3–1.2)
Total Protein: 6.3 g/dL — ABNORMAL LOW (ref 6.5–8.1)

## 2020-12-02 LAB — HIV ANTIBODY (ROUTINE TESTING W REFLEX): HIV Screen 4th Generation wRfx: NONREACTIVE

## 2020-12-02 LAB — LIPASE, BLOOD: Lipase: 40 U/L (ref 11–51)

## 2020-12-02 LAB — TROPONIN I (HIGH SENSITIVITY)
Troponin I (High Sensitivity): 17 ng/L (ref <18)
Troponin I (High Sensitivity): 16 ng/L (ref <18)
Troponin I (High Sensitivity): 14 ng/L (ref <18)
Troponin I (High Sensitivity): 13 ng/L (ref <18)

## 2020-12-02 LAB — RESP PANEL BY RT-PCR (FLU A&B, COVID) ARPGX2
Influenza A by PCR: NEGATIVE
Influenza B by PCR: NEGATIVE
SARS Coronavirus 2 by RT PCR: NEGATIVE

## 2020-12-02 MED ORDER — GENTAMICIN SULFATE 0.1 % EX CREA
1.0000 "application " | TOPICAL_CREAM | Freq: Every day | CUTANEOUS | Status: DC
  Administered 2020-12-03: 1 via TOPICAL
  Filled 2020-12-02: qty 15

## 2020-12-02 MED ORDER — METOPROLOL SUCCINATE ER 50 MG PO TB24
150.0000 mg | ORAL_TABLET | Freq: Every day | ORAL | Status: DC
  Administered 2020-12-02 – 2020-12-03 (×2): 150 mg via ORAL
  Filled 2020-12-02: qty 3
  Filled 2020-12-02: qty 1

## 2020-12-02 MED ORDER — NITROGLYCERIN 0.4 MG SL SUBL
0.4000 mg | SUBLINGUAL_TABLET | SUBLINGUAL | Status: DC | PRN
  Administered 2020-12-02: 0.4 mg via SUBLINGUAL
  Filled 2020-12-02: qty 1

## 2020-12-02 MED ORDER — TORSEMIDE 20 MG PO TABS
10.0000 mg | ORAL_TABLET | Freq: Every day | ORAL | Status: DC
  Administered 2020-12-02 – 2020-12-03 (×2): 10 mg via ORAL
  Filled 2020-12-02 (×2): qty 1

## 2020-12-02 MED ORDER — ACETAMINOPHEN 325 MG PO TABS: 650.0000 mg | ORAL_TABLET | Freq: Four times a day (QID) | ORAL | Status: DC | PRN

## 2020-12-02 MED ORDER — METHOCARBAMOL 500 MG PO TABS
500.0000 mg | ORAL_TABLET | Freq: Three times a day (TID) | ORAL | Status: DC | PRN
  Filled 2020-12-02: qty 1

## 2020-12-02 MED ORDER — ASPIRIN 81 MG PO CHEW
324.0000 mg | CHEWABLE_TABLET | Freq: Once | ORAL | Status: AC
  Administered 2020-12-02: 324 mg via ORAL
  Filled 2020-12-02: qty 4

## 2020-12-02 MED ORDER — CALCITRIOL 0.25 MCG PO CAPS
0.2500 ug | ORAL_CAPSULE | Freq: Every day | ORAL | Status: DC
  Administered 2020-12-02 – 2020-12-03 (×2): 0.25 ug via ORAL
  Filled 2020-12-02 (×2): qty 1

## 2020-12-02 MED ORDER — DELFLEX-LC/1.5% DEXTROSE 344 MOSM/L IP SOLN
INTRAPERITONEAL | Status: DC
  Filled 2020-12-02 (×2): qty 3000

## 2020-12-02 MED ORDER — FENTANYL CITRATE PF 50 MCG/ML IJ SOSY: 25.0000 ug | PREFILLED_SYRINGE | INTRAMUSCULAR | Status: DC | PRN

## 2020-12-02 MED ORDER — ACETAMINOPHEN 500 MG PO TABS
1000.0000 mg | ORAL_TABLET | Freq: Once | ORAL | Status: AC
  Administered 2020-12-02: 1000 mg via ORAL
  Filled 2020-12-02: qty 2

## 2020-12-02 MED ORDER — ASPIRIN EC 81 MG PO TBEC
81.0000 mg | DELAYED_RELEASE_TABLET | Freq: Every day | ORAL | Status: DC
  Administered 2020-12-03: 81 mg via ORAL
  Filled 2020-12-02: qty 1

## 2020-12-02 MED ORDER — ALBUTEROL SULFATE (2.5 MG/3ML) 0.083% IN NEBU: 2.5000 mg | INHALATION_SOLUTION | RESPIRATORY_TRACT | Status: DC | PRN

## 2020-12-02 MED ORDER — PANTOPRAZOLE SODIUM 40 MG PO TBEC
40.0000 mg | DELAYED_RELEASE_TABLET | Freq: Every day | ORAL | Status: DC
  Administered 2020-12-02 – 2020-12-03 (×2): 40 mg via ORAL
  Filled 2020-12-02 (×2): qty 1

## 2020-12-02 MED ORDER — CLONIDINE HCL 0.1 MG PO TABS
0.1000 mg | ORAL_TABLET | Freq: Two times a day (BID) | ORAL | Status: DC
  Administered 2020-12-02 – 2020-12-03 (×3): 0.1 mg via ORAL
  Filled 2020-12-02 (×3): qty 1

## 2020-12-02 MED ORDER — GABAPENTIN 300 MG PO CAPS
300.0000 mg | ORAL_CAPSULE | Freq: Every day | ORAL | Status: DC
  Administered 2020-12-02: 300 mg via ORAL
  Filled 2020-12-02: qty 1

## 2020-12-02 MED ORDER — LABETALOL HCL 5 MG/ML IV SOLN
10.0000 mg | Freq: Once | INTRAVENOUS | Status: AC
  Administered 2020-12-02: 10 mg via INTRAVENOUS
  Filled 2020-12-02: qty 4

## 2020-12-02 MED ORDER — LABETALOL HCL 5 MG/ML IV SOLN: 10.0000 mg | INTRAVENOUS | Status: DC | PRN

## 2020-12-02 MED ORDER — ONDANSETRON HCL 4 MG/2ML IJ SOLN: 4.0000 mg | Freq: Three times a day (TID) | INTRAMUSCULAR | Status: DC | PRN

## 2020-12-02 MED ORDER — ROSUVASTATIN CALCIUM 10 MG PO TABS
40.0000 mg | ORAL_TABLET | Freq: Every day | ORAL | Status: DC
  Administered 2020-12-02 – 2020-12-03 (×2): 40 mg via ORAL
  Filled 2020-12-02: qty 4
  Filled 2020-12-02: qty 2

## 2020-12-02 MED ORDER — PSYLLIUM 95 % PO PACK
1.0000 | PACK | Freq: Every day | ORAL | Status: DC
  Administered 2020-12-03: 1 via ORAL
  Filled 2020-12-02 (×2): qty 1

## 2020-12-02 MED ORDER — ALLOPURINOL 100 MG PO TABS
100.0000 mg | ORAL_TABLET | Freq: Every day | ORAL | Status: DC
  Administered 2020-12-02 – 2020-12-03 (×2): 100 mg via ORAL
  Filled 2020-12-02 (×2): qty 1

## 2020-12-02 MED ORDER — HEPARIN 1000 UNIT/ML FOR PERITONEAL DIALYSIS
500.0000 [IU] | INTRAMUSCULAR | Status: DC | PRN
  Filled 2020-12-02: qty 0.5

## 2020-12-02 MED ORDER — AMLODIPINE BESYLATE 10 MG PO TABS
10.0000 mg | ORAL_TABLET | Freq: Every day | ORAL | Status: DC
  Administered 2020-12-02 – 2020-12-03 (×2): 10 mg via ORAL
  Filled 2020-12-02: qty 1
  Filled 2020-12-02: qty 2

## 2020-12-02 MED ORDER — HEPARIN SODIUM (PORCINE) 5000 UNIT/ML IJ SOLN
5000.0000 [IU] | Freq: Three times a day (TID) | INTRAMUSCULAR | Status: DC
  Administered 2020-12-02 – 2020-12-03 (×2): 5000 [IU] via SUBCUTANEOUS
  Filled 2020-12-02 (×2): qty 1

## 2020-12-02 MED ORDER — ISOSORBIDE MONONITRATE ER 60 MG PO TB24
60.0000 mg | ORAL_TABLET | Freq: Every day | ORAL | Status: DC
  Administered 2020-12-02 – 2020-12-03 (×2): 60 mg via ORAL
  Filled 2020-12-02 (×2): qty 1

## 2020-12-02 MED ORDER — HYDROCODONE-ACETAMINOPHEN 5-325 MG PO TABS: 1.0000 | ORAL_TABLET | Freq: Four times a day (QID) | ORAL | Status: DC | PRN

## 2020-12-02 MED ORDER — LOSARTAN POTASSIUM 50 MG PO TABS
100.0000 mg | ORAL_TABLET | Freq: Every day | ORAL | Status: DC
  Administered 2020-12-02 – 2020-12-03 (×2): 100 mg via ORAL
  Filled 2020-12-02 (×2): qty 2

## 2020-12-02 MED ORDER — FOLIC ACID 1 MG PO TABS
2.0000 mg | ORAL_TABLET | Freq: Every day | ORAL | Status: DC
  Administered 2020-12-02 – 2020-12-03 (×2): 2 mg via ORAL
  Filled 2020-12-02 (×2): qty 2

## 2020-12-02 NOTE — ED Notes (Signed)
Pt's wife, Venida Jarvis, notified by this RN at this time of this pt's change of room and updated on pt's condition.

## 2020-12-02 NOTE — ED Provider Notes (Signed)
[facility] Emergency Department Provider Note   ____________________________________________   Event Date/Time   First MD Initiated Contact with Patient 12/02/20 [phone]     (approximate)  I have reviewed the triage vital signs and the nursing notes.   HISTORY  Chief Complaint Chest Pain    HPI Casey French is a 70 y.o. male with past medical history of hypertension, diabetes, CAD, hyperlipidemia, atrial fibrillation on Eliquis, and ESRD on peritoneal dialysis who presents to the ED complaining of chest pain.  Patient reports that he had sudden onset of severe sharp pain starting in his upper abdomen and radiating into his chest, back, and left neck around 6 AM this morning.  Pain persisted until EMS arrived and gave patient 1 dose of nitroglycerin, after which it has eased off.  He now rates it as more of a pressure and 4 out of 10 on the pain scale.  He states that the pain initially took his breath away, but his breathing now feels back to normal.  He was feeling fine when he went to bed last night with no recent fevers or cough, denies any pain or swelling in his legs.  He does nightly peritoneal dialysis at home, does state that he was unable to run dialysis 2 nights ago due to vomiting and diarrhea, but the symptoms have since resolved and he had a run of dialysis last night.        Past Medical History:  Diagnosis Date   Acute diverticulitis 06/14/2014   Anemia    Anginal pain (HCC)    Aortic atherosclerosis (HCC)    Atrial fibrillation (HCC)    CKD (chronic kidney disease) stage 3, GFR 30-59 ml/min (HCC)    Coronary artery disease    Depression    Diabetes mellitus without complication (HCC)    Diverticulosis    GERD (gastroesophageal reflux disease)    GI bleed 05/15/2019   Gout    Heart murmur    Hematemesis 05/15/2019   High cholesterol    Hypertension    STEMI (ST elevation myocardial infarction) ([place]) 2003   Inferior    Patient  Active Problem List   Diagnosis Date Noted   Chest pain 12/02/2020   B12 deficiency 07/10/2020   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   S/P hip replacement 04/22/2020   Hospital discharge follow-up 02/19/2020   Iron deficiency anemia 02/19/2020   Preop cardiovascular exam 01/09/2020   Anemia in chronic kidney disease 11/15/2019   Benign hypertensive kidney disease with chronic kidney disease 11/15/2019   Chronic kidney disease, stage 3b ([place]) 11/15/2019   Proteinuria 11/15/2019   Hematochezia 05/15/2019   Chronic anticoagulation 05/15/2019   Atrial fibrillation, chronic ([place]) 05/15/2019   Chronic renal disease, stage IV ([place]) 05/15/2019   Unstable angina ([place]) 11/10/2016   S/P cardiac catheterization 09/09/2015   Angina at rest [facility]) 05/01/2015   Hypertensive urgency 06/14/2014   AKI (acute kidney injury) ([place]) 06/14/2014   Benign essential HTN 06/14/2014   Hyperlipidemia 06/14/2014   DM type 2 (diabetes mellitus, type 2) ([place]) 06/14/2014   CAD (coronary artery disease) 06/14/2014   GERD without esophagitis 06/14/2014   Acute kidney failure ([place]) 06/14/2014   Tremor 03/18/2014   Fall at home 08/30/2013   SOB (shortness of breath) on exertion 08/30/2013   Closed head injury 03/24/2013   Unspecified injury of head, initial encounter 03/24/2013    Past Surgical History:  Procedure Laterality Date   CAPD INSERTION N/A 08/13/2020  Procedure: LAPAROSCOPIC INSERTION CONTINUOUS AMBULATORY PERITONEAL DIALYSIS  (CAPD) CATHETER;  Surgeon: Ronny Bacon, MD;  Location: [facility] ORS;  Service: General;  Laterality: N/A;   CARDIAC CATHETERIZATION     CATARACT EXTRACTION W/PHACO Right 07/05/2019   Procedure: CATARACT EXTRACTION PHACO AND INTRAOCULAR LENS PLACEMENT ([place]) RIGHT DIABETIC;  Surgeon: Birder Robson, MD;  Location: [facility] ORS;  Service: Ophthalmology;  Laterality: Right;  US00:59.3 CDE8.41 LOT [id number] h   COLONOSCOPY WITH PROPOFOL N/A 04/01/2017   Procedure:  COLONOSCOPY WITH PROPOFOL;  Surgeon: Lollie Sails, MD;  Location: [facility] ENDOSCOPY;  Service: Endoscopy;  Laterality: N/A;   CORONARY ANGIOPLASTY WITH STENT PLACEMENT  10/2001   RCA   CORONARY ANGIOPLASTY WITH STENT PLACEMENT  07/2015   LAD   right hand surgery  2015   3 screws in right hand   TOTAL HIP ARTHROPLASTY Left 04/22/2020   Procedure: TOTAL HIP ARTHROPLASTY ANTERIOR APPROACH;  Surgeon: Hessie Knows, MD;  Location: [facility] ORS;  Service: Orthopedics;  Laterality: Left;    Prior to Admission medications   Medication Sig Start Date End Date Taking? Authorizing Provider  allopurinol (ZYLOPRIM) 100 MG tablet Take 100 mg by mouth daily.    [provider]  amLODipine (NORVASC) 10 MG tablet Take 10 mg by mouth daily.    [provider]  apixaban (ELIQUIS) 2.5 MG TABS tablet Take 2.5 mg by mouth 2 (two) times daily.    [provider]  aspirin 81 MG EC tablet Take 81 mg by mouth daily.    [provider]  calcitRIOL (ROCALTROL) 0.25 MCG capsule Take 0.25 mcg by mouth daily. 06/10/20 06/10/21  [provider]  cloNIDine (CATAPRES) 0.1 MG tablet Take 1 tablet (0.1 mg total) by mouth 2 (two) times daily. 02/08/20 09/23/20  Wyvonnia Dusky, MD  fexofenadine (ALLEGRA) 180 MG tablet Take 180 mg by mouth daily as needed for allergies or rhinitis.    [provider]  fluticasone (FLONASE) 50 MCG/ACT nasal spray Place 2 sprays into both nostrils daily as needed for allergies. 04/30/14   [provider]  folic acid (FOLVITE) 1 MG tablet TAKE 2 TABLETS(2 MG) BY MOUTH DAILY 06/18/20   Sindy Guadeloupe, MD  gabapentin (NEURONTIN) 300 MG capsule Take 300 mg by mouth at bedtime. 09/25/16   [provider]  HYDROcodone-acetaminophen (NORCO/VICODIN) 5-325 MG tablet Take 1 tablet by mouth every 6 (six) hours as needed for moderate pain. 08/13/20   Ronny Bacon, MD  hydrocortisone (ANUSOL-HC) 2.5 % rectal cream Place 1 application rectally 2  (two) times daily as needed for hemorrhoids or anal itching.    [provider]  isosorbide mononitrate (IMDUR) 60 MG 24 hr tablet Take 60 mg by mouth daily.    [provider]  lisinopril (ZESTRIL) 20 MG tablet Take 20 mg by mouth daily. 07/10/20   [provider]  losartan (COZAAR) 100 MG tablet Take 100 mg by mouth daily. 04/28/20   [provider]  metoprolol succinate (TOPROL-XL) 100 MG 24 hr tablet Take 150 mg by mouth daily. Take with or immediately following a meal.    [provider]  nitroGLYCERIN (NITROSTAT) 0.4 MG SL tablet Place 0.4 mg under the tongue every 5 (five) minutes as needed for chest pain.    [provider]  omeprazole (PRILOSEC) 20 MG capsule Take 20 mg by mouth daily.    [provider]  psyllium (METAMUCIL) 58.6 % packet Take 1 packet by mouth 2 (two) times daily.    [provider]  rosuvastatin (CRESTOR) 40 MG tablet Take 40 mg by mouth daily. 03/06/19   [provider]  torsemide (DEMADEX) 10 MG tablet Take 1 tablet (10 mg total) by mouth daily. Hold this medication until you see your nephrologist 02/08/20   Wyvonnia Dusky, MD  traMADol (ULTRAM) 50 MG tablet Take 1 tablet (50 mg total) by mouth every 6 (six) hours. 04/24/20   Duanne Guess, PA-C    Allergies Morphine  Family History  Problem Relation Age of Onset   Heart disease Mother    Heart disease Father    CAD Other     Social History Social History   Tobacco Use   Smoking status: Former    Types: Cigarettes    Quit date: 03/24/2001    Years since quitting: 19.7   Smokeless tobacco: Never  Vaping Use   Vaping Use: Never used  Substance Use Topics   Alcohol use: No    Alcohol/week: 0.0 standard drinks   Drug use: No    Review of Systems  Constitutional: No fever/chills Eyes: No visual changes. ENT: No sore throat. Cardiovascular: Positive for chest pain. Respiratory: Positive for shortness of  breath. Gastrointestinal: No abdominal pain.  No nausea, no vomiting.  No diarrhea.  No constipation. Genitourinary: Negative for dysuria. Musculoskeletal: Negative for back pain. Skin: Negative for rash. Neurological: Negative for headaches, focal weakness or numbness.  ____________________________________________   PHYSICAL EXAM:  VITAL SIGNS: ED Triage Vitals  Enc Vitals Group     BP 12/02/20 0814 (!) 175/116     Pulse Rate 12/02/20 0814 80     Resp 12/02/20 0814 20     Temp 12/02/20 0814 97.9 F (36.6 C)     Temp Source 12/02/20 0814 Oral     SpO2 12/02/20 0814 96 %     Weight 12/02/20 0816 130 lb (59 kg)     Height 12/02/20 0816 5\' 9"  (1.753 m)     Head Circumference --      Peak Flow --      Pain Score --      Pain Loc --      Pain Edu? --      Excl. in [place]? --     Constitutional: Alert and oriented. Eyes: Conjunctivae are normal. Head: Atraumatic. Nose: No congestion/rhinnorhea. Mouth/Throat: Mucous membranes are moist. Neck: Normal ROM Cardiovascular: Normal rate, regular rhythm. Grossly normal heart sounds.  2+ radial and DP pulses bilaterally. Respiratory: Normal respiratory effort.  No retractions. Lungs CTAB.  No chest wall tenderness to palpation. Gastrointestinal: Soft and nontender. No distention.  PD catheter in place with no surrounding erythema, warmth, or tenderness. Genitourinary: deferred Musculoskeletal: No lower extremity tenderness nor edema. Neurologic:  Normal speech and language. No gross focal neurologic deficits are appreciated. Skin:  Skin is warm, dry and intact. No rash noted. Psychiatric: Mood and affect are normal. Speech and behavior are normal.  ____________________________________________   LABS (all labs ordered are listed, but only abnormal results are displayed)  Labs Reviewed  CBC WITH DIFFERENTIAL/PLATELET - Abnormal; Notable for the following components:      Result Value   RBC 4.05 (*)    Hemoglobin 12.7 (*)    HCT  38.9 (*)    All other components within normal limits  COMPREHENSIVE METABOLIC PANEL - Abnormal; Notable for the following components:   CO2 21 (*)    Glucose, Bld 109 (*)    BUN 40 (*)    Creatinine, Ser 4.93 (*)  Calcium 8.6 (*)    Total Protein 6.3 (*)    Albumin 2.8 (*)    GFR, Estimated 12 (*)    All other components within normal limits  RESP PANEL BY RT-PCR (FLU A&B, COVID) ARPGX2  LIPASE, BLOOD  TROPONIN I (HIGH SENSITIVITY)  TROPONIN I (HIGH SENSITIVITY)   ____________________________________________  EKG  ED ECG REPORT I, Blake Divine, the attending physician, personally viewed and interpreted this ECG.   Date: 12/02/2020  EKG Time: 8:29  Rate: 81  Rhythm: normal sinus rhythm  Axis: Normal  Intervals:none  ST&T Change: None    PROCEDURES  Procedure(s) performed (including Critical Care):  Procedures   ____________________________________________   INITIAL IMPRESSION / ASSESSMENT AND PLAN / ED COURSE      70 year old male with past medical history of hypertension, hyperlipidemia, diabetes, CAD, atrial fibrillation on Eliquis, and ESRD on PD who presents to the ED complaining of acute onset severe pain in his lower chest and upper abdomen that radiates towards his back and left neck.  Pain is improved following dose of nitroglycerin with EMS but remains present, initial EKG shows no evidence of arrhythmia or ischemia.  We will further assess with chest x-ray and labs, including 2 sets of troponin.  Initial work-up is reassuring, chest x-ray reviewed by me and shows no infiltrate, edema, or effusion.  Initial troponin is negative.  Differential also includes dissection, however in further discussion with Dr. Holley Raring of nephrology, we would like to hold off on contrast administration if at all possible given patient's tenuous renal function.  Overall, suspicion for dissection is lower at this time given patient's pain is significantly improved following  second dose of nitroglycerin, now rates as 2 out of 10.  Dr. Holley Raring suggested discussion with cardiology regarding possible echocardiogram to assess for dissection.  Case discussed with [name], unfortunately TTE would not adequately assess for dissection and TEE cannot be done on an emergent basis.  After discussion with multiple consultants, we agreed to hold off on CTA given improving pain and relatively low suspicion at this time.  If patient were to have worsening pain similar to his initial presentation, he would likely require CTA of his chest at that time.  Blood pressure is also improving following dose of IV labetalol and nitroglycerin.  Case discussed with hospitalist for admission for high risk chest pain.       ____________________________________________   FINAL CLINICAL IMPRESSION(S) / ED DIAGNOSES  Final diagnoses:  Nonspecific chest pain  ESRD on peritoneal dialysis [facility])     ED Discharge Orders     None        Note:  This document was prepared using Dragon voice recognition software and may include unintentional dictation errors.    Blake Divine, MD 12/02/20 1049

## 2020-12-02 NOTE — ED Triage Notes (Addendum)
Pt presents for chest pain that started approx.7 am.  Arrived via EMS from home. Hx: 2 MI's, stents, HTN and does home peritoneal dialysis.  Denies SOB or weakness and reports midsternal CP at 4/10. Pt reports pain in midsternal region that radiates up to left neck, shoulder, abdominal area and to back. Reports most severity in abdominal area. Patient took one Nitrogylcerin spray at home and reported relief and took 324 mg Aspirin prior to presentation. Pt had peritoneal dialysis last night and had machine issue and reports it is not draining as much. Pt reports missed one home dialysis 11/6 pm overnight. EMS vitals HR 88, BP originally 364 systolic and most recent 680 systolic prior to presentation. SPO2 on R/A 98%. Pt had vomiting two days ago and reports why missed/did not perform dialysis.

## 2020-12-02 NOTE — ED Notes (Signed)
Pt taken for xray

## 2020-12-02 NOTE — Progress Notes (Signed)
[facility]  ROUNDING NOTE   Subjective:   Casey French is a 70 year old male with past medical history of CAS, STEMI, hypertension, diabetes, hyperlipidemia, a fib on Eliquis and end stage renal disease on peritoneal dialysis. Patient presents to ED with complaints of chest pain. He has been admitted to observation for Chest pain [R07.9]  Patient is known to our clinic and is followed by Dr Juleen China. He states the chest pain developed during dialysis overnight. He was able to complete his treatment but unsure if he completed his last drain. He described pain at sharp, radiating pain to left neck and upper back. Denies nausea and vomiting. States that he's had diarrhea a couple days ago. He has had heart attacks in the past, but this pain was different from those. Troponin 17 on arrival. EKG normal   Objective:  Vital signs in last 24 hours:  Temp:  [97.9 F (36.6 C)] 97.9 F (36.6 C) (11/08 0814) Pulse Rate:  [71-102] 76 (11/08 1500) Resp:  [16-22] 20 (11/08 1445) BP: (99-179)/(64-116) 103/64 (11/08 1500) SpO2:  [93 %-97 %] 95 % (11/08 1500) Weight:  [59 kg] 59 kg (11/08 0816)  Weight change:  Filed Weights   12/02/20 0816  Weight: 59 kg    Intake/Output: No intake/output data recorded.   Intake/Output this shift:  No intake/output data recorded.  Physical Exam: General: NAD, resting on stretcher  Head: Normocephalic, atraumatic. Moist oral mucosal membranes  Eyes: Anicteric  Lungs:  Clear to auscultation, normal effort  Heart: Regular rate and rhythm  Abdomen:  Soft, nontender  Extremities:  no peripheral edema.  Neurologic: Nonfocal, moving all four extremities  Skin: No lesions  Access: PD catheter    Basic Metabolic Panel: Recent Labs  Lab 12/02/20 0818  NA 138  K 3.5  CL 106  CO2 21*  GLUCOSE 109*  BUN 40*  CREATININE 4.93*  CALCIUM 8.6*    Liver Function Tests: Recent Labs  Lab 12/02/20 0818  AST 36  ALT 17  ALKPHOS 108   BILITOT 0.6  PROT 6.3*  ALBUMIN 2.8*   Recent Labs  Lab 12/02/20 0818  LIPASE 40   No results for input(s): AMMONIA in the last 168 hours.  CBC: Recent Labs  Lab 12/02/20 0818  WBC 6.9  NEUTROABS 4.5  HGB 12.7*  HCT 38.9*  MCV 96.0  PLT 238    Cardiac Enzymes: No results for input(s): CKTOTAL, CKMB, CKMBINDEX, TROPONINI in the last 168 hours.  BNP: Invalid input(s): POCBNP  CBG: No results for input(s): GLUCAP in the last 168 hours.  Microbiology: Results for orders placed or performed during the hospital encounter of 12/02/20  Resp Panel by RT-PCR (Flu A&B, Covid) Nasopharyngeal Swab     Status: None   Collection Time: 12/02/20  9:01 AM   Specimen: Nasopharyngeal Swab; Nasopharyngeal(NP) swabs in vial transport medium  Result Value Ref Range Status   SARS Coronavirus 2 by RT PCR NEGATIVE NEGATIVE Final    Comment: (NOTE) SARS-CoV-2 target nucleic acids are NOT DETECTED.  The SARS-CoV-2 RNA is generally detectable in upper respiratory specimens during the acute phase of infection. The lowest concentration of SARS-CoV-2 viral copies this assay can detect is 138 copies/mL. A negative result does not preclude SARS-Cov-2 infection and should not be used as the sole basis for treatment or other patient management decisions. A negative result may occur with  improper specimen collection/handling, submission of specimen other than nasopharyngeal swab, presence of viral mutation(s) within  the areas targeted by this assay, and inadequate number of viral copies(<138 copies/mL). A negative result must be combined with clinical observations, patient history, and epidemiological information. The expected result is Negative.  Fact Sheet for Patients:  EntrepreneurPulse.com.au  Fact Sheet for Healthcare Providers:  IncredibleEmployment.be  This test is no t yet approved or cleared by the [place] FDA and  has been authorized for  detection and/or diagnosis of SARS-CoV-2 by FDA under an Emergency Use Authorization (EUA). This EUA will remain  in effect (meaning this test can be used) for the duration of the COVID-19 declaration under Section 564(b)(1) of the Act, 21 U.S.C.section 360bbb-3(b)(1), unless the authorization is terminated  or revoked sooner.       Influenza A by PCR NEGATIVE NEGATIVE Final   Influenza B by PCR NEGATIVE NEGATIVE Final    Comment: (NOTE) The Xpert Xpress SARS-CoV-2/FLU/RSV plus assay is intended as an aid in the diagnosis of influenza from Nasopharyngeal swab specimens and should not be used as a sole basis for treatment. Nasal washings and aspirates are unacceptable for Xpert Xpress SARS-CoV-2/FLU/RSV testing.  Fact Sheet for Patients: EntrepreneurPulse.com.au  Fact Sheet for Healthcare Providers: IncredibleEmployment.be  This test is not yet approved or cleared by the [place] FDA and has been authorized for detection and/or diagnosis of SARS-CoV-2 by FDA under an Emergency Use Authorization (EUA). This EUA will remain in effect (meaning this test can be used) for the duration of the COVID-19 declaration under Section 564(b)(1) of the Act, 21 U.S.C. section 360bbb-3(b)(1), unless the authorization is terminated or revoked.  Performed at [facility], [address] [place], [place] [address]     Coagulation Studies: No results for input(s): LABPROT, INR in the last 72 hours.  Urinalysis: No results for input(s): COLORURINE, LABSPEC, PHURINE, GLUCOSEU, HGBUR, BILIRUBINUR, KETONESUR, PROTEINUR, UROBILINOGEN, NITRITE, LEUKOCYTESUR in the last 72 hours.  Invalid input(s): APPERANCEUR    Imaging: DG Chest 2 View  Result Date: 12/02/2020 CLINICAL DATA:  Chest pain. EXAM: CHEST - 2 VIEW COMPARISON:  February 02, 2020. FINDINGS: The heart size and mediastinal contours are within normal limits. Left lung is clear. Mild right  basilar subsegmental atelectasis is noted. The visualized skeletal structures are unremarkable. IMPRESSION: Mild right basilar subsegmental atelectasis. Electronically Signed   By: Marijo Conception M.D.   On: 12/02/2020 08:52     Medications:     allopurinol  100 mg Oral Daily   amLODipine  10 mg Oral Daily   [START ON 12/03/2020] aspirin EC  81 mg Oral Daily   calcitRIOL  0.25 mcg Oral Daily   cloNIDine  0.1 mg Oral BID   folic acid  2 mg Oral Daily   gabapentin  300 mg Oral QHS   isosorbide mononitrate  60 mg Oral Daily   losartan  100 mg Oral Daily   metoprolol succinate  150 mg Oral Daily   pantoprazole  40 mg Oral Daily   psyllium  1 packet Oral Daily   rosuvastatin  40 mg Oral Daily   torsemide  10 mg Oral Daily   acetaminophen, albuterol, fentaNYL (SUBLIMAZE) injection, HYDROcodone-acetaminophen, labetalol, methocarbamol, nitroGLYCERIN, ondansetron (ZOFRAN) IV  Assessment/ Plan:  [name] is a 70 y.o.  male with past medical history of CAD, STEMI, hypertension, diabetes, hyperlipidemia, a fib on Eliquis and end stage renal disease on peritoneal dialysis. Patient admitted under observation for Chest pain [R07.9]  [facility] PD  Chest pain with history of CAD: normal EKG, Troponin  59. Cardiology following, medical management and observation at this time.   End stage renal disease on peritoneal dialysis: Will maintain outpatient nightly schedule.   Anemia of chronic kidney disease Lab Results  Component Value Date   HGB 12.7 (L) 12/02/2020   Hgb at goal  4. Secondary Hyperparathyroidism:  Lab Results  Component Value Date   CALCIUM 8.6 (L) 12/02/2020   Calcium below target,  Calcitriol daily outpatient   LOS: 0   11/8/20223:08 PM

## 2020-12-02 NOTE — Consult Note (Signed)
[facility]  CARDIOLOGY CONSULT NOTE  Patient ID: Casey French MRN: [id number] DOB/AGE: [birth year] 70 y.o.  Admit date: 12/02/2020 Referring Physician Blaine Hamper Primary Physician [facility] Primary Cardiologist Lavone Barrientes Reason for Consultation chest pain  HPI: 70 year old gentleman referred for evaluation of chest pain.  The patient has known coronary artery disease, with history of inferior STEMI and Cypher stent RCA 11/22/2001.  Patient underwent DES LAD 07/21/2015.  The patient has a history of paroxysmal atrial fibrillation on Eliquis for stroke prevention.  He has end-stage renal disease on peritoneal dialysis.  He was in his usual state of health till this past weekend when he developed flulike symptoms.  This morning at approximately 7 AM he developed 9-10 left sided chest discomfort with radiation to left neck and left upper back.  The patient presented to [facility] ED where ECG revealed sinus rhythm at 81 bpm without ischemic ST-T wave changes.  Chest x-ray was unremarkable without evidence for widened mediastinum.  Admission labs notable for normal high-sensitivity troponin of 17 and 16.  There was initial concern about the possibility of aortic dissection however chest pain improved and chest CT was deferred in light of concern of contrast induced nephrotoxicity and potential for conversion from peritoneal to hemodialysis.  The patient currently chest pain-free, clinically and hemodynamically stable.  Outpatient Lexiscan Myoview 03/06/2020 revealed LVEF of 57% with mixed inferior scar with ischemia which was felt to be consistent with his known coronary anatomy with chronically occluded RCA.  Review of systems complete and found to be negative unless listed above     Past Medical History:  Diagnosis Date   Acute diverticulitis 06/14/2014   Anemia    Anginal pain (HCC)    Aortic atherosclerosis (HCC)    Atrial fibrillation (HCC)    CKD (chronic kidney disease) stage 3, GFR 30-59 ml/min  (HCC)    Coronary artery disease    Depression    Diabetes mellitus without complication (HCC)    Diverticulosis    GERD (gastroesophageal reflux disease)    GI bleed 05/15/2019   Gout    Heart murmur    Hematemesis 05/15/2019   High cholesterol    Hypertension    STEMI (ST elevation myocardial infarction) ([place]) 2003   Inferior    Past Surgical History:  Procedure Laterality Date   CAPD INSERTION N/A 08/13/2020   Procedure: LAPAROSCOPIC INSERTION CONTINUOUS AMBULATORY PERITONEAL DIALYSIS  (CAPD) CATHETER;  Surgeon: Ronny Bacon, MD;  Location: [facility] ORS;  Service: General;  Laterality: N/A;   CARDIAC CATHETERIZATION     CATARACT EXTRACTION W/PHACO Right 07/05/2019   Procedure: CATARACT EXTRACTION PHACO AND INTRAOCULAR LENS PLACEMENT ([place]) RIGHT DIABETIC;  Surgeon: Birder Robson, MD;  Location: [facility] ORS;  Service: Ophthalmology;  Laterality: Right;  US00:59.3 CDE8.41 LOT [id number] h   COLONOSCOPY WITH PROPOFOL N/A 04/01/2017   Procedure: COLONOSCOPY WITH PROPOFOL;  Surgeon: Lollie Sails, MD;  Location: [facility] ENDOSCOPY;  Service: Endoscopy;  Laterality: N/A;   CORONARY ANGIOPLASTY WITH STENT PLACEMENT  10/2001   RCA   CORONARY ANGIOPLASTY WITH STENT PLACEMENT  07/2015   LAD   right hand surgery  2015   3 screws in right hand   TOTAL HIP ARTHROPLASTY Left 04/22/2020   Procedure: TOTAL HIP ARTHROPLASTY ANTERIOR APPROACH;  Surgeon: Hessie Knows, MD;  Location: [facility] ORS;  Service: Orthopedics;  Laterality: Left;    (Not in a hospital admission)  Social History   Socioeconomic History   Marital status: Married    Spouse name: Sherri   Number  of children: 2   Years of education: Not on file   Highest education level: Not on file  Occupational History   Not on file  Tobacco Use   Smoking status: Former    Types: Cigarettes    Quit date: 03/24/2001    Years since quitting: 19.7   Smokeless tobacco: Never  Vaping Use   Vaping Use: Never used  Substance and Sexual  Activity   Alcohol use: No    Alcohol/week: 0.0 standard drinks   Drug use: No   Sexual activity: Not on file  Other Topics Concern   Not on file  Social History Narrative   Not on file   Social Determinants of Health   Financial Resource Strain: Not on file  Food Insecurity: Not on file  Transportation Needs: Not on file  Physical Activity: Not on file  Stress: Not on file  Social Connections: Not on file  Intimate Partner Violence: Not on file    Family History  Problem Relation Age of Onset   Heart disease Mother    Heart disease Father    CAD Other       Review of systems complete and found to be negative unless listed above      PHYSICAL EXAM  General: Well developed, well nourished, in no acute distress HEENT:  Normocephalic and atramatic Neck:  No JVD.  Lungs: Clear bilaterally to auscultation and percussion. Heart: HRRR . Normal S1 and S2 without gallops or murmurs.  Abdomen: Bowel sounds are positive, abdomen soft and non-tender  Msk:  Back normal, normal gait. Normal strength and tone for age. Extremities: No clubbing, cyanosis or edema.   Neuro: Alert and oriented X 3. Psych:  Good affect, responds appropriately  Labs:   Lab Results  Component Value Date   WBC 6.9 12/02/2020   HGB 12.7 (L) 12/02/2020   HCT 38.9 (L) 12/02/2020   MCV 96.0 12/02/2020   PLT 238 12/02/2020    Recent Labs  Lab 12/02/20 0818  NA 138  K 3.5  CL 106  CO2 21*  BUN 40*  CREATININE 4.93*  CALCIUM 8.6*  PROT 6.3*  BILITOT 0.6  ALKPHOS 108  ALT 17  AST 36  GLUCOSE 109*   Lab Results  Component Value Date   CKTOTAL 198 03/23/2013   CKMB 4.6 (H) 03/23/2013   TROPONINI <0.03 06/26/2018    Lab Results  Component Value Date   CHOL 142 11/10/2016   CHOL 194 09/27/2012   Lab Results  Component Value Date   HDL 45 11/10/2016   HDL 49 09/27/2012   Lab Results  Component Value Date   LDLCALC 75 11/10/2016   LDLCALC 98 09/27/2012   Lab Results   Component Value Date   TRIG 109 11/10/2016   TRIG 233 (H) 09/27/2012   Lab Results  Component Value Date   CHOLHDL 3.2 11/10/2016   No results found for: LDLDIRECT    Radiology: DG Chest 2 View  Result Date: 12/02/2020 CLINICAL DATA:  Chest pain. EXAM: CHEST - 2 VIEW COMPARISON:  February 02, 2020. FINDINGS: The heart size and mediastinal contours are within normal limits. Left lung is clear. Mild right basilar subsegmental atelectasis is noted. The visualized skeletal structures are unremarkable. IMPRESSION: Mild right basilar subsegmental atelectasis. Electronically Signed   By: Marijo Conception M.D.   On: 12/02/2020 08:52    EKG: Sinus rhythm at 81 bpm  ASSESSMENT AND PLAN:   1.  Chest pain, with typical and  atypical features, with normal ECG, normal high-sensitivity troponin.  Chest pain now resolved.  Patient is hemodynamically stable.  Chest x-ray unremarkable does not reveal widened mediastinum.  Pulses are 2+ and equal bilaterally.  Likelihood of aortic dissection is low at this time. 2.  Coronary artery disease, with history of inferior STEMI and Cypher stent RCA 11/22/2001, chronically occluded distal RCA by cardiac catheterization 09/03/2003, [employer] [place] DES mid LAD 07/21/2015, [place] 03/09/2020 revealing mixed inferior scar with ischemia consistent with known coronary anatomy 3.  Paroxysmal atrial fibrillation, currently in sinus rhythm, on Eliquis for stroke prevention 4.  ESRD, on peritoneal dialysis 5.  Essential hypertension, blood pressure well controlled metoprolol succinate, losartan, amlodipine, torsemide and clonidine 6.  Hyperlipidemia, on high-dose rosuvastatin  Recommendations  1.  Agree with overall current therapy 2.  Defer full dose anticoagulation at this time 3.  Continue metoprolol succinate and isosorbide mononitrate 4.  Defer chest CT with contrast at this time 5.  Defer TEE 6.  Defer cardiac catheterization at this time 7.  The patient remains  clinically and hemodynamically stable, resume Eliquis in a.m. 8.  2D echocardiogram  Signed: Isaias Cowman MD,PhD, [facility] 12/02/2020, 2:40 PM

## 2020-12-02 NOTE — H&P (Addendum)
History and Physical    Casey French WLN:[id number] DOB: Dec 23, 1950 DOA: 12/02/2020  Referring MD/NP/PA:   PCP: [name], MD   Patient coming from:  The patient is coming from home.  At baseline, pt is independent for most of ADL.        Chief Complaint: chest pain  HPI: Casey French is a 70 y.o. male with medical history significant of ESRD on peritoneal dialysis, CAD, STEMI, stent placement, hypertension, hyperlipidemia, diabetes mellitus, GI bleeding, atrial fibrillation on Eliquis, diverticulitis, who presents with chest pain.  Patient states that his chest pain started at about 7 AM, which is located in the substernal area, initially 10 out of 10 severity, currently chest pain-free, sharp, radiating to the left neck, left upper back and left lower rib cage.  Associated with mild shortness of breath, no cough, fever or chills.  Chest pain is not pleuritic, not aggravated with deep breath.  Patient states that he had diarrhea 2 days ago, which has resolved.  Currently no nausea, vomiting, diarrhea or abdominal pain.  No symptoms of UTI.  He states that he had partial peritoneal dialysis last night.  He also states that he missed dialysis on 11/6.  Patient reported to pharmacist that he is taking 2.5 mg of Eliquis daily, but he reported to me that he has been taking 2.5 mg of Eliquis twice a day, last dose was 2 days ago.  ED Course: pt was found to have WBC 6.9, troponin level 17, negative COVID PCR, potassium 3.5, bicarbonate 21, creatinine 4.93, BUN 20, temperature normal, initial systolic blood pressure 740, which improved to 147/97 after giving 10 mg of IV labetalol in ED.  Heart rate 102, 71, RR 21, oxygen saturation 93-97% on room air.  Chest x-ray showed right basilar subsegmental atelectasis.  Patient is placed on progressive bed for observation, [name] of cardiology and Dr. Holley Raring of renal are consulted.  Review of Systems:   General: no fevers, chills, no  body weight gain, has fatigue HEENT: no blurry vision, hearing changes or sore throat Respiratory: has dyspnea, no coughing, wheezing CV: has chest pain, no palpitations GI: has nausea, no vomiting, abdominal pain, diarrhea, constipation GU: no dysuria, burning on urination, increased urinary frequency, hematuria  Ext: no leg edema Neuro: no unilateral weakness, numbness, or tingling, no vision change or hearing loss Skin: no rash, no skin tear. MSK: No muscle spasm, no deformity, no limitation of range of movement in spin Heme: No easy bruising.  Travel history: No recent long distant travel.  Allergy:  Allergies  Allergen Reactions   Morphine Anxiety    agitation     Past Medical History:  Diagnosis Date   Acute diverticulitis 06/14/2014   Anemia    Anginal pain (HCC)    Aortic atherosclerosis (HCC)    Atrial fibrillation (HCC)    CKD (chronic kidney disease) stage 3, GFR 30-59 ml/min (HCC)    Coronary artery disease    Depression    Diabetes mellitus without complication (HCC)    Diverticulosis    GERD (gastroesophageal reflux disease)    GI bleed 05/15/2019   Gout    Heart murmur    Hematemesis 05/15/2019   High cholesterol    Hypertension    STEMI (ST elevation myocardial infarction) ([place]) 2003   Inferior    Past Surgical History:  Procedure Laterality Date   CAPD INSERTION N/A 08/13/2020   Procedure: LAPAROSCOPIC INSERTION CONTINUOUS AMBULATORY PERITONEAL DIALYSIS  (CAPD) CATHETER;  Surgeon: Ronny Bacon, MD;  Location: [facility] ORS;  Service: General;  Laterality: N/A;   CARDIAC CATHETERIZATION     CATARACT EXTRACTION W/PHACO Right 07/05/2019   Procedure: CATARACT EXTRACTION PHACO AND INTRAOCULAR LENS PLACEMENT ([place]) RIGHT DIABETIC;  Surgeon: Birder Robson, MD;  Location: [facility] ORS;  Service: Ophthalmology;  Laterality: Right;  US00:59.3 CDE8.41 LOT [id number] h   COLONOSCOPY WITH PROPOFOL N/A 04/01/2017   Procedure: COLONOSCOPY WITH PROPOFOL;  Surgeon: Lollie Sails, MD;  Location: [facility] ENDOSCOPY;  Service: Endoscopy;  Laterality: N/A;   CORONARY ANGIOPLASTY WITH STENT PLACEMENT  10/2001   RCA   CORONARY ANGIOPLASTY WITH STENT PLACEMENT  07/2015   LAD   right hand surgery  2015   3 screws in right hand   TOTAL HIP ARTHROPLASTY Left 04/22/2020   Procedure: TOTAL HIP ARTHROPLASTY ANTERIOR APPROACH;  Surgeon: Hessie Knows, MD;  Location: [facility] ORS;  Service: Orthopedics;  Laterality: Left;    Social History:  reports that he quit smoking about 19 years ago. His smoking use included cigarettes. He has never used smokeless tobacco. He reports that he does not drink alcohol and does not use drugs.  Family History:  Family History  Problem Relation Age of Onset   Heart disease Mother    Heart disease Father    CAD Other      Prior to Admission medications   Medication Sig Start Date End Date Taking? Authorizing Provider  allopurinol (ZYLOPRIM) 100 MG tablet Take 100 mg by mouth daily.    [provider]  amLODipine (NORVASC) 10 MG tablet Take 10 mg by mouth daily.    [provider]  apixaban (ELIQUIS) 2.5 MG TABS tablet Take 2.5 mg by mouth 2 (two) times daily.    [provider]  aspirin 81 MG EC tablet Take 81 mg by mouth daily.    [provider]  calcitRIOL (ROCALTROL) 0.25 MCG capsule Take 0.25 mcg by mouth daily. 06/10/20 06/10/21  [provider]  cloNIDine (CATAPRES) 0.1 MG tablet Take 1 tablet (0.1 mg total) by mouth 2 (two) times daily. 02/08/20 09/23/20  Wyvonnia Dusky, MD  fexofenadine (ALLEGRA) 180 MG tablet Take 180 mg by mouth daily as needed for allergies or rhinitis.    [provider]  fluticasone (FLONASE) 50 MCG/ACT nasal spray Place 2 sprays into both nostrils daily as needed for allergies. 04/30/14   [provider]  folic acid (FOLVITE) 1 MG tablet TAKE 2 TABLETS(2 MG) BY MOUTH DAILY 06/18/20   Sindy Guadeloupe, MD  gabapentin (NEURONTIN) 300 MG capsule Take 300  mg by mouth at bedtime. 09/25/16   [provider]  HYDROcodone-acetaminophen (NORCO/VICODIN) 5-325 MG tablet Take 1 tablet by mouth every 6 (six) hours as needed for moderate pain. 08/13/20   Ronny Bacon, MD  hydrocortisone (ANUSOL-HC) 2.5 % rectal cream Place 1 application rectally 2 (two) times daily as needed for hemorrhoids or anal itching.    [provider]  isosorbide mononitrate (IMDUR) 60 MG 24 hr tablet Take 60 mg by mouth daily.    [provider]  lisinopril (ZESTRIL) 20 MG tablet Take 20 mg by mouth daily. 07/10/20   [provider]  losartan (COZAAR) 100 MG tablet Take 100 mg by mouth daily. 04/28/20   [provider]  metoprolol succinate (TOPROL-XL) 100 MG 24 hr tablet Take 150 mg by mouth daily. Take with or immediately following a meal.    [provider]  nitroGLYCERIN (NITROSTAT) 0.4 MG SL tablet Place  0.4 mg under the tongue every 5 (five) minutes as needed for chest pain.    [provider]  omeprazole (PRILOSEC) 20 MG capsule Take 20 mg by mouth daily.    [provider]  psyllium (METAMUCIL) 58.6 % packet Take 1 packet by mouth 2 (two) times daily.    [provider]  rosuvastatin (CRESTOR) 40 MG tablet Take 40 mg by mouth daily. 03/06/19   [provider]  torsemide (DEMADEX) 10 MG tablet Take 1 tablet (10 mg total) by mouth daily. Hold this medication until you see your nephrologist 02/08/20   Wyvonnia Dusky, MD  traMADol (ULTRAM) 50 MG tablet Take 1 tablet (50 mg total) by mouth every 6 (six) hours. 04/24/20   Duanne Guess, PA-C    Physical Exam: Vitals:   12/02/20 1415 12/02/20 1430 12/02/20 1445 12/02/20 1500  BP: 99/70 108/80 114/76 103/64  Pulse: 73 77 71 76  Resp: (!) 21 (!) 21 20   Temp:      TempSrc:      SpO2: 96% 97% 96% 95%  Weight:      Height:       General: Not in acute distress HEENT:       Eyes: PERRL, EOMI, no scleral icterus.       ENT: No discharge  from the ears and nose, no pharynx injection, no tonsillar enlargement.        Neck: No JVD, no bruit, no mass felt. Heme: No neck lymph node enlargement. Cardiac: S1/S2, RRR, has soft murmurs, No gallops or rubs. Respiratory: No rales, wheezing, rhonchi or rubs. GI: Soft, nondistended, nontender, no rebound pain, no organomegaly, BS present. Has peritoneal dialysis catheter in place with clean surrounding. GU: No hematuria Ext: No pitting leg edema bilaterally. 1+DP/PT pulse bilaterally. Musculoskeletal: No joint deformities, No joint redness or warmth, no limitation of ROM in spin. Skin: No rashes.  Neuro: Alert, oriented X3, cranial nerves II-XII grossly intact, moves all extremities normally. Psych: Patient is not psychotic, no suicidal or hemocidal ideation.  Labs on Admission: I have personally reviewed following labs and imaging studies  CBC: Recent Labs  Lab 12/02/20 0818  WBC 6.9  NEUTROABS 4.5  HGB 12.7*  HCT 38.9*  MCV 96.0  PLT 811   Basic Metabolic Panel: Recent Labs  Lab 12/02/20 0818  NA 138  K 3.5  CL 106  CO2 21*  GLUCOSE 109*  BUN 40*  CREATININE 4.93*  CALCIUM 8.6*   GFR: Estimated Creatinine Clearance: 11.8 mL/min (A) (by C-G formula based on SCr of 4.93 mg/dL (H)). Liver Function Tests: Recent Labs  Lab 12/02/20 0818  AST 36  ALT 17  ALKPHOS 108  BILITOT 0.6  PROT 6.3*  ALBUMIN 2.8*   Recent Labs  Lab 12/02/20 0818  LIPASE 40   No results for input(s): AMMONIA in the last 168 hours. Coagulation Profile: No results for input(s): INR, PROTIME in the last 168 hours. Cardiac Enzymes: No results for input(s): CKTOTAL, CKMB, CKMBINDEX, TROPONINI in the last 168 hours. BNP (last 3 results) No results for input(s): PROBNP in the last 8760 hours. HbA1C: No results for input(s): HGBA1C in the last 72 hours. CBG: No results for input(s): GLUCAP in the last 168 hours. Lipid Profile: No results for input(s): CHOL, HDL, LDLCALC, TRIG,  CHOLHDL, LDLDIRECT in the last 72 hours. Thyroid Function Tests: No results for input(s): TSH, T4TOTAL, FREET4, T3FREE, THYROIDAB in the last 72 hours. Anemia Panel: No results for input(s): VITAMINB12,  FOLATE, FERRITIN, TIBC, IRON, RETICCTPCT in the last 72 hours. Urine analysis:    Component Value Date/Time   COLORURINE YELLOW (A) 12/10/2018 1632   APPEARANCEUR CLEAR (A) 12/10/2018 1632   LABSPEC 1.024 12/10/2018 1632   PHURINE 6.0 12/10/2018 1632   GLUCOSEU 150 (A) 12/10/2018 1632   HGBUR NEGATIVE 12/10/2018 1632   [place] 12/10/2018 1632   KETONESUR NEGATIVE 12/10/2018 1632   PROTEINUR >=300 (A) 12/10/2018 1632   NITRITE NEGATIVE 12/10/2018 1632   LEUKOCYTESUR NEGATIVE 12/10/2018 1632   Sepsis Labs: @LABRCNTIP (procalcitonin:4,lacticidven:4) ) Recent Results (from the past 240 hour(s))  Resp Panel by RT-PCR (Flu A&B, Covid) Nasopharyngeal Swab     Status: None   Collection Time: 12/02/20  9:01 AM   Specimen: Nasopharyngeal Swab; Nasopharyngeal(NP) swabs in vial transport medium  Result Value Ref Range Status   SARS Coronavirus 2 by RT PCR NEGATIVE NEGATIVE Final    Comment: (NOTE) SARS-CoV-2 target nucleic acids are NOT DETECTED.  The SARS-CoV-2 RNA is generally detectable in upper respiratory specimens during the acute phase of infection. The lowest concentration of SARS-CoV-2 viral copies this assay can detect is 138 copies/mL. A negative result does not preclude SARS-Cov-2 infection and should not be used as the sole basis for treatment or other patient management decisions. A negative result may occur with  improper specimen collection/handling, submission of specimen other than nasopharyngeal swab, presence of viral mutation(s) within the areas targeted by this assay, and inadequate number of viral copies(<138 copies/mL). A negative result must be combined with clinical observations, patient history, and epidemiological information. The expected result  is Negative.  Fact Sheet for Patients:  EntrepreneurPulse.com.au  Fact Sheet for Healthcare Providers:  IncredibleEmployment.be  This test is no t yet approved or cleared by the [place] FDA and  has been authorized for detection and/or diagnosis of SARS-CoV-2 by FDA under an Emergency Use Authorization (EUA). This EUA will remain  in effect (meaning this test can be used) for the duration of the COVID-19 declaration under Section 564(b)(1) of the Act, 21 U.S.C.section 360bbb-3(b)(1), unless the authorization is terminated  or revoked sooner.       Influenza A by PCR NEGATIVE NEGATIVE Final   Influenza B by PCR NEGATIVE NEGATIVE Final    Comment: (NOTE) The Xpert Xpress SARS-CoV-2/FLU/RSV plus assay is intended as an aid in the diagnosis of influenza from Nasopharyngeal swab specimens and should not be used as a sole basis for treatment. Nasal washings and aspirates are unacceptable for Xpert Xpress SARS-CoV-2/FLU/RSV testing.  Fact Sheet for Patients: EntrepreneurPulse.com.au  Fact Sheet for Healthcare Providers: IncredibleEmployment.be  This test is not yet approved or cleared by the [place] FDA and has been authorized for detection and/or diagnosis of SARS-CoV-2 by FDA under an Emergency Use Authorization (EUA). This EUA will remain in effect (meaning this test can be used) for the duration of the COVID-19 declaration under Section 564(b)(1) of the Act, 21 U.S.C. section 360bbb-3(b)(1), unless the authorization is terminated or revoked.  Performed at [facility], [address] [place], [place] [address]      Radiological Exams on Admission: DG Chest 2 View  Result Date: 12/02/2020 CLINICAL DATA:  Chest pain. EXAM: CHEST - 2 VIEW COMPARISON:  February 02, 2020. FINDINGS: The heart size and mediastinal contours are within normal limits. Left lung is clear. Mild right basilar  subsegmental atelectasis is noted. The visualized skeletal structures are unremarkable. IMPRESSION: Mild right basilar subsegmental atelectasis. Electronically Signed   By: Sabino Dick  Jr M.D.   On: 12/02/2020 08:52     EKG: I have personally reviewed.  Sinus rhythm, QTC 461, LAE, early R wave progression  Assessment/Plan Principal Problem:   Chest pain Active Problems:   Hypertensive urgency   CAD (coronary artery disease)   GERD without esophagitis   Atrial fibrillation, chronic (HCC)   HTN (hypertension)   HLD (hyperlipidemia)   Gout   ESRD on peritoneal dialysis (HCC)   Chest pain and hx of CAD: s/p of stent.  Patient has elevated blood pressure and severe chest pain, radiating to the left neck and left upper back, initially suspected aortic dissection, but his chest pain has improved, currently chest pain-free.  Also chest pain is radiating to the left upper back, not to the midline of his upper back, suspicion for aortic dissection is low now.  Patient just newly started peritoneal dialysis, still urinating, will try to avoid contrast as long as dissection is not highly suspected. Patient has history of STEMI with stent placement, is high risk patient for ACS.  Demand ischemia is also possible due to elevated blood pressure.  [name] of cardiology is consulted.  - place to cardiac tele bed for observation - Trend Trop - Repeat EKG in the am  - prn Nitroglycerin, Fentanyl - ASA, crestor, Imdur - Risk factor stratification: will check FLP and A1C   Hypertensive urgency and HTN: SBP 188 initially --> 147/97 -prn labetalol IV -Continue home amlodipine, Cozaar, metoprolol,, imdur, clonidine  GERD without esophagitis -Protonix  Atrial fibrillation, chronic (HCC) -Metoprolol -per [name], "The patient remains clinically and hemodynamically stable, resume Eliquis in a.m".  I have not ordered Eliquis yet.  HLD (hyperlipidemia) -Crestor  Gout -Allopurinol  ESRD  on peritoneal dialysis (HCC) -Consulted Dr. Holley Raring for dialysis    DVT ppx: sq Heparin Code Status: Full code Family Communication:  Yes, patient's  wife  at bed side Disposition Plan:  Anticipate discharge back to previous environment Consults called:  [name] of cardiology and Dr. Holley Raring of renal are consulted. Admission status and Level of care: Telemetry Cardiac:    for obs    Status is: Observation  The patient remains OBS appropriate and will d/c before 2 midnights.         Date of Service 12/02/2020    Ivor Costa [facility]   If 7PM-7AM, please contact night-coverage [email or web address] 12/02/2020, 4:32 PM

## 2020-12-03 ENCOUNTER — Observation Stay
Admit: 2020-12-03 | Discharge: 2020-12-03 | Disposition: A | Discharge status: Home / Self Care | Payer: Medicare PPO | Attending: Internal Medicine | Admitting: Internal Medicine

## 2020-12-03 DIAGNOSIS — R079 Chest pain, unspecified: Secondary | ICD-10-CM | POA: Diagnosis not present

## 2020-12-03 LAB — BASIC METABOLIC PANEL
Anion gap: 9 (ref 5–15)
BUN: 34 mg/dL — ABNORMAL HIGH (ref 8–23)
CO2: 24 mmol/L (ref 22–32)
Calcium: 8.3 mg/dL — ABNORMAL LOW (ref 8.9–10.3)
Chloride: 108 mmol/L (ref 98–111)
Creatinine, Ser: 4.75 mg/dL — ABNORMAL HIGH (ref 0.61–1.24)
GFR, Estimated: 13 mL/min — ABNORMAL LOW (ref >60)
Glucose, Bld: 100 mg/dL — ABNORMAL HIGH (ref 70–99)
Potassium: 3.3 mmol/L — ABNORMAL LOW (ref 3.5–5.1)
Sodium: 141 mmol/L (ref 135–145)

## 2020-12-03 LAB — ECHOCARDIOGRAM COMPLETE
AR max vel: 3.5 cm2
AV Area VTI: 3.14 cm2
AV Area mean vel: 3.34 cm2
AV Mean grad: 4 mmHg
AV Peak grad: 5.9 mmHg
Ao pk vel: 1.21 m/s
Area-P 1/2: 3.11 cm2
Height: 69 in
MV VTI: 3.63 cm2
S' Lateral: 2.2 cm
Weight: 2296.31 oz

## 2020-12-03 LAB — GLUCOSE, CAPILLARY: Glucose-Capillary: 91 mg/dL (ref 70–99)

## 2020-12-03 MED ORDER — POTASSIUM CHLORIDE CRYS ER 20 MEQ PO TBCR
20.0000 meq | EXTENDED_RELEASE_TABLET | Freq: Once | ORAL | Status: AC
  Administered 2020-12-03: 20 meq via ORAL
  Filled 2020-12-03: qty 1

## 2020-12-03 MED ORDER — NEPRO/CARBSTEADY PO LIQD: 237.0000 mL | Freq: Two times a day (BID) | ORAL | Status: DC

## 2020-12-03 MED ORDER — PERFLUTREN LIPID MICROSPHERE
1.0000 mL | INTRAVENOUS | Status: AC | PRN
  Administered 2020-12-03: 3 mL via INTRAVENOUS
  Filled 2020-12-03: qty 10

## 2020-12-03 MED ORDER — RENA-VITE PO TABS
1.0000 | ORAL_TABLET | Freq: Every day | ORAL | Status: DC
  Filled 2020-12-03: qty 1

## 2020-12-03 MED ORDER — APIXABAN 2.5 MG PO TABS
2.5000 mg | ORAL_TABLET | Freq: Two times a day (BID) | ORAL | Status: DC
  Administered 2020-12-03: 2.5 mg via ORAL
  Filled 2020-12-03: qty 1

## 2020-12-03 NOTE — Progress Notes (Signed)
[facility] Cardiology    SUBJECTIVE: The patient reports feeling better. He denies recurrent chest pain, reports that he is breathing normally for him, and denies any recent peripheral edema.   Vitals:   12/02/20 1950 12/02/20 2233 12/02/20 2329 12/03/20 0350  BP: 107/74 121/73 118/81 108/76  Pulse: 75  74 72  Resp: 18  18 18   Temp: 97.7 F (36.5 C)  98 F (36.7 C) (!) 97.3 F (36.3 C)  TempSrc: Oral     SpO2: 100%  94% 96%  Weight:      Height:         Intake/Output Summary (Last 24 hours) at 12/03/2020 0819 Last data filed at 12/02/2020 1922 Gross per 24 hour  Intake 240 ml  Output 300 ml  Net -60 ml      PHYSICAL EXAM  General: Frail gentleman lying in bed in no acute distress HEENT:  Normocephalic and atramatic Neck:  No JVD.  Lungs: normal effort of breathing on room air. Heart: HRRR .  Abdomen: nondistended Extremities: No clubbing, cyanosis or edema.   Neuro: Alert and oriented X 3. Psych:  Good affect, responds appropriately   LABS: Basic Metabolic Panel: Recent Labs    12/02/20 0818 12/03/20 0320  NA 138 141  K 3.5 3.3*  CL 106 108  CO2 21* 24  GLUCOSE 109* 100*  BUN 40* 34*  CREATININE 4.93* 4.75*  CALCIUM 8.6* 8.3*   Liver Function Tests: Recent Labs    12/02/20 0818  AST 36  ALT 17  ALKPHOS 108  BILITOT 0.6  PROT 6.3*  ALBUMIN 2.8*   Recent Labs    12/02/20 0818  LIPASE 40   CBC: Recent Labs    12/02/20 0818  WBC 6.9  NEUTROABS 4.5  HGB 12.7*  HCT 38.9*  MCV 96.0  PLT 238   Cardiac Enzymes: No results for input(s): CKTOTAL, CKMB, CKMBINDEX, TROPONINI in the last 72 hours. BNP: Invalid input(s): POCBNP D-Dimer: No results for input(s): DDIMER in the last 72 hours. Hemoglobin A1C: No results for input(s): HGBA1C in the last 72 hours. Fasting Lipid Panel: No results for input(s): CHOL, HDL, LDLCALC, TRIG, CHOLHDL, LDLDIRECT in the last 72 hours. Thyroid Function Tests: No results for input(s): TSH, T4TOTAL, T3FREE,  THYROIDAB in the last 72 hours.  Invalid input(s): FREET3 Anemia Panel: No results for input(s): VITAMINB12, FOLATE, FERRITIN, TIBC, IRON, RETICCTPCT in the last 72 hours.  DG Chest 2 View  Result Date: 12/02/2020 CLINICAL DATA:  Chest pain. EXAM: CHEST - 2 VIEW COMPARISON:  February 02, 2020. FINDINGS: The heart size and mediastinal contours are within normal limits. Left lung is clear. Mild right basilar subsegmental atelectasis is noted. The visualized skeletal structures are unremarkable. IMPRESSION: Mild right basilar subsegmental atelectasis. Electronically Signed   By: Marijo Conception M.D.   On: 12/02/2020 08:52     Echo pending  TELEMETRY: sinus rhythm, 66 bpm  ASSESSMENT AND PLAN:  Principal Problem:   Chest pain Active Problems:   Hypertensive urgency   CAD (coronary artery disease)   GERD without esophagitis   Atrial fibrillation, chronic (HCC)   HTN (hypertension)   HLD (hyperlipidemia)   Gout   ESRD on peritoneal dialysis ([place])    Chest pain, with typical and atypical features, with normal ECG, normal high-sensitivity troponin x 4.  Chest pain now resolved.  Patient is hemodynamically stable.  Chest x-ray unremarkable does not reveal widened mediastinum.  Pulses are 2+ and equal bilaterally.  Likelihood of aortic dissection  is low at this time. 2.  Coronary artery disease, with history of inferior STEMI and Cypher stent RCA 11/22/2001, chronically occluded distal RCA by cardiac catheterization 09/03/2003, Xience [place] DES mid LAD 07/21/2015, [place] 03/09/2020 revealing mixed inferior scar with ischemia consistent with known coronary anatomy 3.  Paroxysmal atrial fibrillation, currently in sinus rhythm, on Eliquis for stroke prevention 4.  ESRD, on peritoneal dialysis 5.  Essential hypertension, blood pressure well controlled metoprolol succinate, losartan, amlodipine, torsemide and clonidine 6.  Hyperlipidemia, on high-dose rosuvastatin  Plan: Agree with  current plan Defer full dose anticoagulation at this time Continue metoprolol succinate, isosorbide mononitrate, Crestor, and aspirin Resume Eliquis today Review 2D echocardiogram Patient may be discharged today from cardiovascular perspective. Follow-up with [name] in 1 week.  Sign off for now; please call/[place] with any questions.  Clabe Seal, PA-C 12/03/2020 8:19 AM

## 2020-12-03 NOTE — Progress Notes (Signed)
Initial Nutrition Assessment  DOCUMENTATION CODES:  Severe malnutrition in context of chronic illness  INTERVENTION:  Add Nepro Shake po BID, each supplement provides 425 kcal and 19 grams protein.  Add Rena-Vite daily.   Encourage PO and supplement intake.  NUTRITION DIAGNOSIS:  Severe Malnutrition related to chronic illness (ESRD on PD) as evidenced by severe muscle depletion, percent weight loss.  GOAL:  Patient will meet greater than or equal to 90% of their needs  MONITOR:  PO intake, Supplement acceptance, Labs, Weight trends, I & O's  REASON FOR ASSESSMENT:  Malnutrition Screening Tool    ASSESSMENT:  70 yo male with a PMH of ESRD on peritoneal dialysis, CAD, STEMI s/p stent, HTN, HLD, T2DM, GI bleeding, A-fib, and diverticulitis who presents with chest pain.  Cardiology following, medical management and observation at this time.End stage renal disease on peritoneal dialysis - maintain outpatient nightly schedule.  Spoke with pt at bedside. Pt reports that he has been eating well at home with no differences on PD days. He reports that he has no recent changes in his appetite or PO intake.  Per Epic, pt ate 50% of dinner yesterday, and almost all of his breakfast this morning per RD observation.   Pt reports that his weight fluctuates, even with PD. He reports that he normally weighs about 150 lbs.  Per Epic, pt has lost ~9 lbs (6%) in the past 2.5 months, which is significant and severe for the time frame.  UOP: 300 ml + 1 unmeasured   Intake/Output Summary (Last 24 hours) at 12/03/2020 1130 Last data filed at 12/02/2020 1922 Gross per 24 hour  Intake 240 ml  Output 300 ml  Net -60 ml    Recommend Nepro shakes BID and Rena-Vite daily.  Medications: reviewed; calcitriol, folic acid, Protonix  Labs: reviewed; K 3.3 (L), CBG 88-145 HbA1c: 6% (05/15/2019)  NUTRITION - FOCUSED PHYSICAL EXAM: Flowsheet Row Most Recent Value  Orbital Region Moderate depletion   Upper Arm Region Moderate depletion  Thoracic and Lumbar Region Moderate depletion  Buccal Region Severe depletion  Temple Region Severe depletion  Clavicle Bone Region Moderate depletion  Clavicle and Acromion Bone Region Moderate depletion  Scapular Bone Region Severe depletion  Dorsal Hand Moderate depletion  Patellar Region Mild depletion  Anterior Thigh Region Mild depletion  Posterior Calf Region Mild depletion  Edema (RD Assessment) None  Hair Reviewed  Eyes Reviewed  Mouth Reviewed  Skin Reviewed  Nails Reviewed   Diet Order:   Diet Order             Diet renal with fluid restriction Fluid restriction: 1200 mL Fluid; Room service appropriate? Yes; Fluid consistency: Thin  Diet effective now                  EDUCATION NEEDS:  Education needs have been addressed  Skin:  Skin Assessment: Reviewed RN Assessment  Last BM:  11/30/20  Height:  Ht Readings from Last 1 Encounters:  12/02/20 5\' 9"  (1.753 m)   Weight:  Wt Readings from Last 1 Encounters:  12/02/20 65.1 kg   BMI:  Body mass index is 21.19 kg/m.  Estimated Nutritional Needs:  Kcal:  1950-2150 Protein:  85-100 grams Fluid:  1000 ml + UOP  Derrel Nip, RD, LDN (she/her/hers) Clinical Inpatient Dietitian RD Pager/After-Hours/Weekend Pager # in [place]

## 2020-12-03 NOTE — Discharge Summary (Signed)
Physician Discharge Summary  Casey French QIW:[id number] DOB: 1950-03-16 DOA: 12/02/2020  PCP: [name], MD  Admit date: 12/02/2020 Discharge date: 12/03/2020  Admitted From: Home Disposition:  Home  Discharge Condition:Stable CODE STATUS:FULL Diet recommendation: Heart Healthy    Brief/Interim Summary: HPI( as per Dr Blaine Hamper): Casey French is a 70 y.o. male with medical history significant of ESRD on peritoneal dialysis, CAD, STEMI, stent placement, hypertension, hyperlipidemia, diabetes mellitus, GI bleeding, atrial fibrillation on Eliquis, diverticulitis, who presents with chest pain.   Patient states that his chest pain started at about 7 AM, which is located in the substernal area, initially 10 out of 10 severity, currently chest pain-free, sharp, radiating to the left neck, left upper back and left lower rib cage.  Associated with mild shortness of breath, no cough, fever or chills.  Chest pain is not pleuritic, not aggravated with deep breath.  Patient states that he had diarrhea 2 days ago, which has resolved.  Currently no nausea, vomiting, diarrhea or abdominal pain.  No symptoms of UTI.  He states that he had partial peritoneal dialysis last night.  He also states that he missed dialysis on 11/6.  Patient reported to pharmacist that he is taking 2.5 mg of Eliquis daily, but he reported to me that he has been taking 2.5 mg of Eliquis twice a day, last dose was 2 days ago.   ED Course: pt was found to have WBC 6.9, troponin level 17, negative COVID PCR, potassium 3.5, bicarbonate 21, creatinine 4.93, BUN 20, temperature normal, initial systolic blood pressure 417, which improved to 147/97 after giving 10 mg of IV labetalol in ED.  Heart rate 102, 71, RR 21, oxygen saturation 93-97% on room air.  Chest x-ray showed right basilar subsegmental atelectasis.  Patient is placed on progressive bed for observation, [name] of cardiology and Dr. Holley Raring of renal are  consulted.  Hospital course:  His hospital course remained stable.  He initially presented with chest pain which has resolved.  Chest pain had both typical and atypical features.  ECG did not show any ischemic changes.  Troponins were nonreassuring for ACS.  Chest x-ray did not show any acute intrathoracic abnormality.  Patient remained hemodynamically stable.  Cardiology and nephrology were following here. Since patient has resolution of chest pain and there is low suspicion for ACS, cardiology has cleared him for discharge.  He underwent echocardiogram, the results will be reviewed by cardiology as an outpatient.  He will follow-up with his cardiologist in a week.  Patient is medically stable for discharge.  Discharge Diagnoses:  Principal Problem:   Chest pain Active Problems:   Hypertensive urgency   CAD (coronary artery disease)   GERD without esophagitis   Atrial fibrillation, chronic (HCC)   HTN (hypertension)   HLD (hyperlipidemia)   Gout   ESRD on peritoneal dialysis [facility])    Discharge Instructions  Discharge Instructions     Diet - low sodium heart healthy   Complete by: As directed    Discharge instructions   Complete by: As directed    1)Please continue taking your home medications 2)Follow up with cardiology in a week.  Name and number of the provider has been attached   Increase activity slowly   Complete by: As directed    No wound care   Complete by: As directed       Allergies as of 12/03/2020       Reactions   Morphine Anxiety   agitation  Medication List     STOP taking these medications    cloNIDine 0.1 MG tablet Commonly known as: CATAPRES   lisinopril 20 MG tablet Commonly known as: ZESTRIL   traMADol 50 MG tablet Commonly known as: ULTRAM       TAKE these medications    allopurinol 100 MG tablet Commonly known as: ZYLOPRIM Take 100 mg by mouth daily.   amLODipine 10 MG tablet Commonly known as: NORVASC Take 10 mg by  mouth daily.   apixaban 2.5 MG Tabs tablet Commonly known as: ELIQUIS Take 2.5 mg by mouth 2 (two) times daily.   aspirin 81 MG EC tablet Take 81 mg by mouth daily.   calcitRIOL 0.25 MCG capsule Commonly known as: ROCALTROL Take 0.25 mcg by mouth daily.   fexofenadine 180 MG tablet Commonly known as: ALLEGRA Take 180 mg by mouth daily as needed for allergies or rhinitis.   fluticasone 50 MCG/ACT nasal spray Commonly known as: FLONASE Place 2 sprays into both nostrils daily as needed for allergies.   folic acid 1 MG tablet Commonly known as: FOLVITE TAKE 2 TABLETS(2 MG) BY MOUTH DAILY   gabapentin 300 MG capsule Commonly known as: NEURONTIN Take 300 mg by mouth at bedtime.   HYDROcodone-acetaminophen 5-325 MG tablet Commonly known as: NORCO/VICODIN Take 1 tablet by mouth every 6 (six) hours as needed for moderate pain.   hydrocortisone 2.5 % rectal cream Commonly known as: ANUSOL-HC Place 1 application rectally 2 (two) times daily as needed for hemorrhoids or anal itching.   isosorbide mononitrate 60 MG 24 hr tablet Commonly known as: IMDUR Take 60 mg by mouth daily.   losartan 100 MG tablet Commonly known as: COZAAR Take 100 mg by mouth daily.   methocarbamol 500 MG tablet Commonly known as: ROBAXIN Take 500 mg by mouth 3 (three) times daily as needed.   metoprolol succinate 100 MG 24 hr tablet Commonly known as: TOPROL-XL Take 150 mg by mouth daily. Take with or immediately following a meal.   nitroGLYCERIN 0.4 MG SL tablet Commonly known as: NITROSTAT Place 0.4 mg under the tongue every 5 (five) minutes as needed for chest pain.   omeprazole 20 MG capsule Commonly known as: PRILOSEC Take 20 mg by mouth daily.   psyllium 58.6 % packet Commonly known as: METAMUCIL Take 1 packet by mouth 2 (two) times daily.   rosuvastatin 40 MG tablet Commonly known as: CRESTOR Take 40 mg by mouth daily.   torsemide 10 MG tablet Commonly known as: DEMADEX Take 1  tablet (10 mg total) by mouth daily. Hold this medication until you see your nephrologist   Vitamin D (Ergocalciferol) 1.25 MG (50000 UNIT) Caps capsule Commonly known as: DRISDOL Take 50,000 Units by mouth once a week.        Follow-up Information     Paraschos, Alexander, MD. Go in 1 week(s).   Specialty: Cardiology Contact information: [facility] [place] [address] [phone]                Allergies  Allergen Reactions   Morphine Anxiety    agitation     Consultations: Cardiology, nephrology   Procedures/Studies: DG Chest 2 View  Result Date: 12/02/2020 CLINICAL DATA:  Chest pain. EXAM: CHEST - 2 VIEW COMPARISON:  February 02, 2020. FINDINGS: The heart size and mediastinal contours are within normal limits. Left lung is clear. Mild right basilar subsegmental atelectasis is noted. The visualized skeletal structures are unremarkable. IMPRESSION: Mild right  basilar subsegmental atelectasis. Electronically Signed   By: Marijo Conception M.D.   On: 12/02/2020 08:52      Subjective:  Patient seen and examined the bedside this morning.  Hemodynamically stable for discharge.  Denies any chest pain  Discharge Exam: Vitals:   12/03/20 0839 12/03/20 1144  BP: 111/80 100/68  Pulse: 77 72  Resp: 18 18  Temp: 97.9 F (36.6 C) 97.8 F (36.6 C)  SpO2: 96% 97%   Vitals:   12/02/20 2329 12/03/20 0350 12/03/20 0839 12/03/20 1144  BP: 118/81 108/76 111/80 100/68  Pulse: 74 72 77 72  Resp: 18 18 18 18   Temp: 98 F (36.7 C) (!) 97.3 F (36.3 C) 97.9 F (36.6 C) 97.8 F (36.6 C)  TempSrc:   Oral   SpO2: 94% 96% 96% 97%  Weight:      Height:        General: Pt is alert, awake, not in acute distress Cardiovascular: RRR, S1/S2 +, no rubs, no gallops Respiratory: CTA bilaterally, no wheezing, no rhonchi Abdominal: Soft, NT, ND, bowel sounds + Extremities: no edema, no cyanosis    The results of significant  diagnostics from this hospitalization (including imaging, microbiology, ancillary and laboratory) are listed below for reference.     Microbiology: Recent Results (from the past 240 hour(s))  Resp Panel by RT-PCR (Flu A&B, Covid) Nasopharyngeal Swab     Status: None   Collection Time: 12/02/20  9:01 AM   Specimen: Nasopharyngeal Swab; Nasopharyngeal(NP) swabs in vial transport medium  Result Value Ref Range Status   SARS Coronavirus 2 by RT PCR NEGATIVE NEGATIVE Final    Comment: (NOTE) SARS-CoV-2 target nucleic acids are NOT DETECTED.  The SARS-CoV-2 RNA is generally detectable in upper respiratory specimens during the acute phase of infection. The lowest concentration of SARS-CoV-2 viral copies this assay can detect is 138 copies/mL. A negative result does not preclude SARS-Cov-2 infection and should not be used as the sole basis for treatment or other patient management decisions. A negative result may occur with  improper specimen collection/handling, submission of specimen other than nasopharyngeal swab, presence of viral mutation(s) within the areas targeted by this assay, and inadequate number of viral copies(<138 copies/mL). A negative result must be combined with clinical observations, patient history, and epidemiological information. The expected result is Negative.  Fact Sheet for Patients:  EntrepreneurPulse.com.au  Fact Sheet for Healthcare Providers:  IncredibleEmployment.be  This test is no t yet approved or cleared by the [place] FDA and  has been authorized for detection and/or diagnosis of SARS-CoV-2 by FDA under an Emergency Use Authorization (EUA). This EUA will remain  in effect (meaning this test can be used) for the duration of the COVID-19 declaration under Section 564(b)(1) of the Act, 21 U.S.C.section 360bbb-3(b)(1), unless the authorization is terminated  or revoked sooner.       Influenza A by PCR NEGATIVE  NEGATIVE Final   Influenza B by PCR NEGATIVE NEGATIVE Final    Comment: (NOTE) The Xpert Xpress SARS-CoV-2/FLU/RSV plus assay is intended as an aid in the diagnosis of influenza from Nasopharyngeal swab specimens and should not be used as a sole basis for treatment. Nasal washings and aspirates are unacceptable for Xpert Xpress SARS-CoV-2/FLU/RSV testing.  Fact Sheet for Patients: EntrepreneurPulse.com.au  Fact Sheet for Healthcare Providers: IncredibleEmployment.be  This test is not yet approved or cleared by the [place] FDA and has been authorized for detection and/or diagnosis of SARS-CoV-2 by FDA under an Emergency Use  Authorization (EUA). This EUA will remain in effect (meaning this test can be used) for the duration of the COVID-19 declaration under Section 564(b)(1) of the Act, 21 U.S.C. section 360bbb-3(b)(1), unless the authorization is terminated or revoked.  Performed at [facility], [address] [place], [place] [address]      Labs: BNP (last 3 results) No results for input(s): BNP in the last 8760 hours. Basic Metabolic Panel: Recent Labs  Lab 12/02/20 0818 12/03/20 0320  NA 138 141  K 3.5 3.3*  CL 106 108  CO2 21* 24  GLUCOSE 109* 100*  BUN 40* 34*  CREATININE 4.93* 4.75*  CALCIUM 8.6* 8.3*   Liver Function Tests: Recent Labs  Lab 12/02/20 0818  AST 36  ALT 17  ALKPHOS 108  BILITOT 0.6  PROT 6.3*  ALBUMIN 2.8*   Recent Labs  Lab 12/02/20 0818  LIPASE 40   No results for input(s): AMMONIA in the last 168 hours. CBC: Recent Labs  Lab 12/02/20 0818  WBC 6.9  NEUTROABS 4.5  HGB 12.7*  HCT 38.9*  MCV 96.0  PLT 238   Cardiac Enzymes: No results for input(s): CKTOTAL, CKMB, CKMBINDEX, TROPONINI in the last 168 hours. BNP: Invalid input(s): POCBNP CBG: Recent Labs  Lab 12/03/20 0931  GLUCAP 91   D-Dimer No results for input(s): DDIMER in the last 72 hours. Hgb A1c No  results for input(s): HGBA1C in the last 72 hours. Lipid Profile No results for input(s): CHOL, HDL, LDLCALC, TRIG, CHOLHDL, LDLDIRECT in the last 72 hours. Thyroid function studies No results for input(s): TSH, T4TOTAL, T3FREE, THYROIDAB in the last 72 hours.  Invalid input(s): FREET3 Anemia work up No results for input(s): VITAMINB12, FOLATE, FERRITIN, TIBC, IRON, RETICCTPCT in the last 72 hours. Urinalysis    Component Value Date/Time   COLORURINE YELLOW (A) 12/10/2018 1632   APPEARANCEUR CLEAR (A) 12/10/2018 1632   LABSPEC 1.024 12/10/2018 1632   PHURINE 6.0 12/10/2018 1632   GLUCOSEU 150 (A) 12/10/2018 1632   HGBUR NEGATIVE 12/10/2018 1632   BILIRUBINUR NEGATIVE 12/10/2018 1632   KETONESUR NEGATIVE 12/10/2018 1632   PROTEINUR >=300 (A) 12/10/2018 1632   NITRITE NEGATIVE 12/10/2018 1632   LEUKOCYTESUR NEGATIVE 12/10/2018 1632   Sepsis Labs Invalid input(s): PROCALCITONIN,  WBC,  LACTICIDVEN Microbiology Recent Results (from the past 240 hour(s))  Resp Panel by RT-PCR (Flu A&B, Covid) Nasopharyngeal Swab     Status: None   Collection Time: 12/02/20  9:01 AM   Specimen: Nasopharyngeal Swab; Nasopharyngeal(NP) swabs in vial transport medium  Result Value Ref Range Status   SARS Coronavirus 2 by RT PCR NEGATIVE NEGATIVE Final    Comment: (NOTE) SARS-CoV-2 target nucleic acids are NOT DETECTED.  The SARS-CoV-2 RNA is generally detectable in upper respiratory specimens during the acute phase of infection. The lowest concentration of SARS-CoV-2 viral copies this assay can detect is 138 copies/mL. A negative result does not preclude SARS-Cov-2 infection and should not be used as the sole basis for treatment or other patient management decisions. A negative result may occur with  improper specimen collection/handling, submission of specimen other than nasopharyngeal swab, presence of viral mutation(s) within the areas targeted by this assay, and inadequate number of  viral copies(<138 copies/mL). A negative result must be combined with clinical observations, patient history, and epidemiological information. The expected result is Negative.  Fact Sheet for Patients:  EntrepreneurPulse.com.au  Fact Sheet for Healthcare Providers:  IncredibleEmployment.be  This test is no t yet approved or cleared by the  [place] Architectural [employer] and  has been authorized for detection and/or diagnosis of SARS-CoV-2 by FDA under an Print production planner (EUA). This EUA will remain  in effect (meaning this test can be used) for the duration of the COVID-19 declaration under Section 564(b)(1) of the Act, 21 U.S.C.section 360bbb-3(b)(1), unless the authorization is terminated  or revoked sooner.       Influenza A by PCR NEGATIVE NEGATIVE Final   Influenza B by PCR NEGATIVE NEGATIVE Final    Comment: (NOTE) The Xpert Xpress SARS-CoV-2/FLU/RSV plus assay is intended as an aid in the diagnosis of influenza from Nasopharyngeal swab specimens and should not be used as a sole basis for treatment. Nasal washings and aspirates are unacceptable for Xpert Xpress SARS-CoV-2/FLU/RSV testing.  Fact Sheet for Patients: EntrepreneurPulse.com.au  Fact Sheet for Healthcare Providers: IncredibleEmployment.be  This test is not yet approved or cleared by the [place] FDA and has been authorized for detection and/or diagnosis of SARS-CoV-2 by FDA under an Emergency Use Authorization (EUA). This EUA will remain in effect (meaning this test can be used) for the duration of the COVID-19 declaration under Section 564(b)(1) of the Act, 21 U.S.C. section 360bbb-3(b)(1), unless the authorization is terminated or revoked.  Performed at [facility], [address], [place], [place] [address]     Please note: You were cared for by a hospitalist during your hospital stay. Once you are discharged, your  primary care physician will handle any further medical issues. Please note that NO REFILLS for any discharge medications will be authorized once you are discharged, as it is imperative that you return to your primary care physician (or establish a relationship with a primary care physician if you do not have one) for your post hospital discharge needs so that they can reassess your need for medications and monitor your lab values.    Time coordinating discharge: 40 minutes  SIGNED:   Shelly Coss, MD  [facility] 12/03/2020, 12:09 PM Pager [phone]  If 7PM-7AM, please contact night-coverage [email or web address] Password [email or web address]

## 2020-12-03 NOTE — Progress Notes (Signed)
*  PRELIMINARY RESULTS* Echocardiogram 2D Echocardiogram has been performed.  Casey French 12/03/2020, 9:40 AM

## 2020-12-03 NOTE — Progress Notes (Signed)
[facility]  ROUNDING NOTE   Subjective:   Casey French is a 70 year old male with past medical history of CAS, STEMI, hypertension, diabetes, hyperlipidemia, a fib on Eliquis and end stage renal disease on peritoneal dialysis. Patient presents to ED with complaints of chest pain. He has been admitted to observation for ESRD on peritoneal dialysis ([place]) [N18.6, Z99.2] Chest pain [R07.9] Nonspecific chest pain [R07.9]  Patient seen sitting up in bed, alert and oriented Tolerating meals, denies chest pain overnight, PD treatment tolerated well overnight.  Objective:  Vital signs in last 24 hours:  Temp:  [97.3 F (36.3 C)-98.2 F (36.8 C)] 97.8 F (36.6 C) (11/09 1144) Pulse Rate:  [72-77] 72 (11/09 1144) Resp:  [18-20] 18 (11/09 1144) BP: (100-121)/(68-81) 100/68 (11/09 1144) SpO2:  [94 %-100 %] 97 % (11/09 1144) Weight:  [65.1 kg] 65.1 kg (11/08 1948)  Weight change:  Filed Weights   12/02/20 0816 12/02/20 1948  Weight: 59 kg 65.1 kg    Intake/Output: I/O last 3 completed shifts: In: 240 [P.O.:240] Out: 300 [Urine:300]   Intake/Output this shift:  Total I/O In: 240 [P.O.:240] Out: 425 [Urine:425]  Physical Exam: General: NAD, sitting up in bed  Head: Normocephalic, atraumatic. Moist oral mucosal membranes  Eyes: Anicteric  Lungs:  Clear to auscultation, normal effort  Heart: Regular rate and rhythm  Abdomen:  Soft, nontender  Extremities:  no peripheral edema.  Neurologic: Nonfocal, moving all four extremities  Skin: No lesions  Access: PD catheter    Basic Metabolic Panel: Recent Labs  Lab 12/02/20 0818 12/03/20 0320  NA 138 141  K 3.5 3.3*  CL 106 108  CO2 21* 24  GLUCOSE 109* 100*  BUN 40* 34*  CREATININE 4.93* 4.75*  CALCIUM 8.6* 8.3*     Liver Function Tests: Recent Labs  Lab 12/02/20 0818  AST 36  ALT 17  ALKPHOS 108  BILITOT 0.6  PROT 6.3*  ALBUMIN 2.8*    Recent Labs  Lab 12/02/20 0818  LIPASE 40    No  results for input(s): AMMONIA in the last 168 hours.  CBC: Recent Labs  Lab 12/02/20 0818  WBC 6.9  NEUTROABS 4.5  HGB 12.7*  HCT 38.9*  MCV 96.0  PLT 238     Cardiac Enzymes: No results for input(s): CKTOTAL, CKMB, CKMBINDEX, TROPONINI in the last 168 hours.  BNP: Invalid input(s): POCBNP  CBG: Recent Labs  Lab 12/03/20 2440  NUUVOZ 36    Microbiology: Results for orders placed or performed during the hospital encounter of 12/02/20  Resp Panel by RT-PCR (Flu A&B, Covid) Nasopharyngeal Swab     Status: None   Collection Time: 12/02/20  9:01 AM   Specimen: Nasopharyngeal Swab; Nasopharyngeal(NP) swabs in vial transport medium  Result Value Ref Range Status   SARS Coronavirus 2 by RT PCR NEGATIVE NEGATIVE Final    Comment: (NOTE) SARS-CoV-2 target nucleic acids are NOT DETECTED.  The SARS-CoV-2 RNA is generally detectable in upper respiratory specimens during the acute phase of infection. The lowest concentration of SARS-CoV-2 viral copies this assay can detect is 138 copies/mL. A negative result does not preclude SARS-Cov-2 infection and should not be used as the sole basis for treatment or other patient management decisions. A negative result may occur with  improper specimen collection/handling, submission of specimen other than nasopharyngeal swab, presence of viral mutation(s) within the areas targeted by this assay, and inadequate number of viral copies(<138 copies/mL). A negative result must be combined  with clinical observations, patient history, and epidemiological information. The expected result is Negative.  Fact Sheet for Patients:  EntrepreneurPulse.com.au  Fact Sheet for Healthcare Providers:  IncredibleEmployment.be  This test is no t yet approved or cleared by the [place] FDA and  has been authorized for detection and/or diagnosis of SARS-CoV-2 by FDA under an Emergency Use Authorization (EUA). This EUA  will remain  in effect (meaning this test can be used) for the duration of the COVID-19 declaration under Section 564(b)(1) of the Act, 21 U.S.C.section 360bbb-3(b)(1), unless the authorization is terminated  or revoked sooner.       Influenza A by PCR NEGATIVE NEGATIVE Final   Influenza B by PCR NEGATIVE NEGATIVE Final    Comment: (NOTE) The Xpert Xpress SARS-CoV-2/FLU/RSV plus assay is intended as an aid in the diagnosis of influenza from Nasopharyngeal swab specimens and should not be used as a sole basis for treatment. Nasal washings and aspirates are unacceptable for Xpert Xpress SARS-CoV-2/FLU/RSV testing.  Fact Sheet for Patients: EntrepreneurPulse.com.au  Fact Sheet for Healthcare Providers: IncredibleEmployment.be  This test is not yet approved or cleared by the [place] FDA and has been authorized for detection and/or diagnosis of SARS-CoV-2 by FDA under an Emergency Use Authorization (EUA). This EUA will remain in effect (meaning this test can be used) for the duration of the COVID-19 declaration under Section 564(b)(1) of the Act, 21 U.S.C. section 360bbb-3(b)(1), unless the authorization is terminated or revoked.  Performed at [facility], [address] [place], [place] [address]     Coagulation Studies: No results for input(s): LABPROT, INR in the last 72 hours.  Urinalysis: No results for input(s): COLORURINE, LABSPEC, PHURINE, GLUCOSEU, HGBUR, BILIRUBINUR, KETONESUR, PROTEINUR, UROBILINOGEN, NITRITE, LEUKOCYTESUR in the last 72 hours.  Invalid input(s): APPERANCEUR    Imaging: DG Chest 2 View  Result Date: 12/02/2020 CLINICAL DATA:  Chest pain. EXAM: CHEST - 2 VIEW COMPARISON:  February 02, 2020. FINDINGS: The heart size and mediastinal contours are within normal limits. Left lung is clear. Mild right basilar subsegmental atelectasis is noted. The visualized skeletal structures are unremarkable.  IMPRESSION: Mild right basilar subsegmental atelectasis. Electronically Signed   By: Marijo Conception M.D.   On: 12/02/2020 08:52   ECHOCARDIOGRAM COMPLETE  Result Date: 12/03/2020    ECHOCARDIOGRAM REPORT   Patient Name:   Casey French Date of Exam: 12/03/2020 Medical Rec #:  [id number]           Height:       69.0 in Accession #:    [id number]          Weight:       143.5 lb Date of Birth:  [birth year]           BSA:          1.794 m Patient Age:    17 years            BP:           108/76 mmHg Patient Gender: M                   HR:           67 bpm. Exam Location:  [facility] Procedure: 2D Echo, Color Doppler, Cardiac Doppler and Intracardiac            Opacification Agent Indications:     R07.9 Chest Pain  History:         Patient has prior history of Echocardiogram  examinations, most                  recent 02/04/2020. CAD, ESRD; Risk Factors:Hypertension,                  Diabetes and HCL.  Sonographer:     Charmayne Sheer Referring Phys:  [id number] AMRIT ADHIKARI Diagnosing Phys: Isaias Cowman MD  Sonographer Comments: Technically difficult study due to poor echo windows. IMPRESSIONS  1. Left ventricular ejection fraction, by estimation, is 60 to 65%. The left ventricle has normal function. The left ventricle has no regional wall motion abnormalities. Left ventricular diastolic parameters were normal.  2. Right ventricular systolic function is normal. The right ventricular size is normal.  3. The mitral valve is normal in structure. Trivial mitral valve regurgitation. No evidence of mitral stenosis.  4. The aortic valve is normal in structure. Aortic valve regurgitation is not visualized. No aortic stenosis is present.  5. The inferior vena cava is normal in size with greater than 50% respiratory variability, suggesting right atrial pressure of 3 mmHg. FINDINGS  Left Ventricle: Left ventricular ejection fraction, by estimation, is 60 to 65%. The left ventricle has normal function. The left ventricle has no  regional wall motion abnormalities. Definity contrast agent was given IV to delineate the left ventricular  endocardial borders. The left ventricular internal cavity size was normal in size. There is no left ventricular hypertrophy. Left ventricular diastolic parameters were normal. Right Ventricle: The right ventricular size is normal. No increase in right ventricular wall thickness. Right ventricular systolic function is normal. Left Atrium: Left atrial size was normal in size. Right Atrium: Right atrial size was normal in size. Pericardium: There is no evidence of pericardial effusion. Mitral Valve: The mitral valve is normal in structure. Trivial mitral valve regurgitation. No evidence of mitral valve stenosis. MV peak gradient, 2.8 mmHg. The mean mitral valve gradient is 1.0 mmHg. Tricuspid Valve: The tricuspid valve is normal in structure. Tricuspid valve regurgitation is trivial. No evidence of tricuspid stenosis. Aortic Valve: The aortic valve is normal in structure. Aortic valve regurgitation is not visualized. No aortic stenosis is present. Aortic valve mean gradient measures 4.0 mmHg. Aortic valve peak gradient measures 5.9 mmHg. Aortic valve area, by VTI measures 3.14 cm. Pulmonic Valve: The pulmonic valve was normal in structure. Pulmonic valve regurgitation is not visualized. No evidence of pulmonic stenosis. Aorta: The aortic root is normal in size and structure. Venous: The inferior vena cava is normal in size with greater than 50% respiratory variability, suggesting right atrial pressure of 3 mmHg. IAS/Shunts: No atrial level shunt detected by color flow Doppler.  LEFT VENTRICLE PLAX 2D LVIDd:         4.00 cm   Diastology LVIDs:         2.20 cm   LV e' medial:    6.20 cm/s LV PW:         0.80 cm   LV E/e' medial:  10.3 LV IVS:        0.70 cm   LV e' lateral:   8.27 cm/s LVOT diam:     2.30 cm   LV E/e' lateral: 7.7 LV SV:         75 LV SV Index:   42 LVOT Area:     4.15 cm  RIGHT VENTRICLE RV  Basal diam:  4.50 cm LEFT ATRIUM           Index  RIGHT ATRIUM           Index LA diam:      3.10 cm 1.73 cm/m   RA Area:     17.00 cm LA Vol (A4C): 42.1 ml 23.46 ml/m  RA Volume:   50.60 ml  28.20 ml/m  AORTIC VALVE AV Area (Vmax):    3.50 cm AV Area (Vmean):   3.34 cm AV Area (VTI):     3.14 cm AV Vmax:           121.00 cm/s AV Vmean:          95.100 cm/s AV VTI:            0.238 m AV Peak Grad:      5.9 mmHg AV Mean Grad:      4.0 mmHg LVOT Vmax:         102.00 cm/s LVOT Vmean:        76.500 cm/s LVOT VTI:          0.180 m LVOT/AV VTI ratio: 0.76  AORTA Ao Root diam: 3.50 cm MITRAL VALVE MV Area (PHT): 3.11 cm    SHUNTS MV Area VTI:   3.63 cm    Systemic VTI:  0.18 m MV Peak grad:  2.8 mmHg    Systemic Diam: 2.30 cm MV Mean grad:  1.0 mmHg MV Vmax:       0.84 m/s MV Vmean:      50.4 cm/s MV Decel Time: 244 msec MV E velocity: 63.80 cm/s MV A velocity: 57.40 cm/s MV E/A ratio:  1.11 Isaias Cowman MD Electronically signed by Isaias Cowman MD Signature Date/Time: 12/03/2020/1:18:53 PM    Final      Medications:    dialysis solution 1.5% low-MG/low-CA      allopurinol  100 mg Oral Daily   amLODipine  10 mg Oral Daily   apixaban  2.5 mg Oral BID   aspirin EC  81 mg Oral Daily   calcitRIOL  0.25 mcg Oral Daily   cloNIDine  0.1 mg Oral BID   feeding supplement (NEPRO CARB STEADY)  237 mL Oral BID BM   folic acid  2 mg Oral Daily   gabapentin  300 mg Oral QHS   gentamicin cream  1 application Topical Daily   isosorbide mononitrate  60 mg Oral Daily   losartan  100 mg Oral Daily   metoprolol succinate  150 mg Oral Daily   multivitamin  1 tablet Oral QHS   pantoprazole  40 mg Oral Daily   psyllium  1 packet Oral Daily   rosuvastatin  40 mg Oral Daily   torsemide  10 mg Oral Daily   acetaminophen, albuterol, fentaNYL (SUBLIMAZE) injection, heparin, HYDROcodone-acetaminophen, labetalol, methocarbamol, nitroGLYCERIN, ondansetron (ZOFRAN) IV  Assessment/ Plan:  Mr. SHAYN MADOLE is a 70 y.o.  male with past medical history of CAD, STEMI, hypertension, diabetes, hyperlipidemia, a fib on Eliquis and end stage renal disease on peritoneal dialysis. Patient admitted under observation for ESRD on peritoneal dialysis ([place]) [N18.6, Z99.2] Chest pain [R07.9] Nonspecific chest pain [R07.9]  [facility] PD  Chest pain with history of CAD: normal EKG, Troponin 17. Cardiology requesting echo.  Echo completed and shows EF 60 to 65%.  Patient will follow up with cardiology outpatient  End stage renal disease on peritoneal dialysis: PD treatment completed overnight, tolerated well  Anemia of chronic kidney disease Lab Results  Component Value Date   HGB 12.7 (L) 12/02/2020  Hemoglobin above target  4. Secondary Hyperparathyroidism:  Lab Results  Component Value Date   CALCIUM 8.3 (L) 12/03/2020   Calcium not at goal Calcitriol daily outpatient   LOS: 0   11/9/20223:30 PM

## 2020-12-16 ENCOUNTER — Encounter: Payer: Self-pay | Admitting: Oncology

## 2020-12-16 ENCOUNTER — Other Ambulatory Visit: Payer: Self-pay

## 2020-12-16 ENCOUNTER — Inpatient Hospital Stay (HOSPITAL_BASED_OUTPATIENT_CLINIC_OR_DEPARTMENT_OTHER): Payer: Medicare PPO | Admitting: Oncology

## 2020-12-16 ENCOUNTER — Inpatient Hospital Stay: Payer: Medicare PPO

## 2020-12-16 VITALS — BP 134/88 | HR 71 | Temp 96.8°F | Resp 18 | Wt 145.2 lb

## 2020-12-16 DIAGNOSIS — D631 Anemia in chronic kidney disease: Secondary | ICD-10-CM

## 2020-12-16 DIAGNOSIS — N184 Chronic kidney disease, stage 4 (severe): Secondary | ICD-10-CM

## 2020-12-16 DIAGNOSIS — E538 Deficiency of other specified B group vitamins: Secondary | ICD-10-CM

## 2020-12-16 DIAGNOSIS — N051 Unspecified nephritic syndrome with focal and segmental glomerular lesions: Secondary | ICD-10-CM

## 2020-12-16 LAB — CBC WITH DIFFERENTIAL/PLATELET
Abs Immature Granulocytes: 0.06 10*3/uL (ref 0.00–0.07)
Basophils Absolute: 0 10*3/uL (ref 0.0–0.1)
Basophils Relative: 1 %
Eosinophils Absolute: 0.1 10*3/uL (ref 0.0–0.5)
Eosinophils Relative: 1 %
HCT: 37 % — ABNORMAL LOW (ref 39.0–52.0)
Hemoglobin: 11.8 g/dL — ABNORMAL LOW (ref 13.0–17.0)
Immature Granulocytes: 1 %
Lymphocytes Relative: 11 %
Lymphs Abs: 0.7 10*3/uL (ref 0.7–4.0)
MCH: 31.3 pg (ref 26.0–34.0)
MCHC: 31.9 g/dL (ref 30.0–36.0)
MCV: 98.1 fL (ref 80.0–100.0)
Monocytes Absolute: 0.6 10*3/uL (ref 0.1–1.0)
Monocytes Relative: 10 %
Neutro Abs: 4.8 10*3/uL (ref 1.7–7.7)
Neutrophils Relative %: 76 %
Platelets: 173 10*3/uL (ref 150–400)
RBC: 3.77 MIL/uL — ABNORMAL LOW (ref 4.22–5.81)
RDW: 14.2 % (ref 11.5–15.5)
WBC: 6.3 10*3/uL (ref 4.0–10.5)
nRBC: 0 % (ref 0.0–0.2)

## 2020-12-16 LAB — IRON AND TIBC
Iron: 76 ug/dL (ref 45–182)
Saturation Ratios: 31 % (ref 17.9–39.5)
TIBC: 245 ug/dL — ABNORMAL LOW (ref 250–450)
UIBC: 169 ug/dL

## 2020-12-16 LAB — COMPREHENSIVE METABOLIC PANEL
ALT: 11 U/L (ref 0–44)
AST: 14 U/L — ABNORMAL LOW (ref 15–41)
Albumin: 2.6 g/dL — ABNORMAL LOW (ref 3.5–5.0)
Alkaline Phosphatase: 106 U/L (ref 38–126)
Anion gap: 8 (ref 5–15)
BUN: 34 mg/dL — ABNORMAL HIGH (ref 8–23)
CO2: 27 mmol/L (ref 22–32)
Calcium: 8.2 mg/dL — ABNORMAL LOW (ref 8.9–10.3)
Chloride: 106 mmol/L (ref 98–111)
Creatinine, Ser: 3.94 mg/dL — ABNORMAL HIGH (ref 0.61–1.24)
GFR, Estimated: 16 mL/min — ABNORMAL LOW (ref >60)
Glucose, Bld: 92 mg/dL (ref 70–99)
Potassium: 4.5 mmol/L (ref 3.5–5.1)
Sodium: 141 mmol/L (ref 135–145)
Total Bilirubin: 0.2 mg/dL — ABNORMAL LOW (ref 0.3–1.2)
Total Protein: 5.8 g/dL — ABNORMAL LOW (ref 6.5–8.1)

## 2020-12-16 LAB — FERRITIN: Ferritin: 200 ng/mL (ref 24–336)

## 2020-12-16 NOTE — Progress Notes (Signed)
Wife will like to know if pt is able to drink ENSURE or BOOST shakes since his appetite has not been well.

## 2020-12-16 NOTE — Progress Notes (Signed)
Hematology/Oncology Consult note [facility]  [phone] [phone]  Patient Care Team: [name], MD as PCP - General (Family Medicine) Ubaldo Glassing Javier Docker, MD as Consulting Physician (Cardiology) Isaias Cowman, MD as Consulting Physician (Cardiology)   Name of the patient: Casey French  [id number]  Jan 24, 1951   Date of visit: 12/16/20  Diagnosis-normocytic anemia multifactorial secondary to iron deficiency and anemia of chronic kidney disease  Chief complaint/ Reason for visit-routine follow-up of anemia  Heme/Onc history: Patient is a 70 year old male with a past medical history significant for stage IV CKD hypertension hyperlipidemia type 2 diabetes and atrial fibrillation.  He follows up with Dr. Zollie Scale.  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  Patient did not want to take steroids due to his diabetes.  He is on rituxan for FSGS   He was also referred to [place] for anemia and results of anemia work-up were as follows: White count and platelets were normal and H&H was 7.8/23.9.  Folate levels were low at 5.7, B12 low at 207.  Ferritin levels at 129 and iron studies showed low TIBC of 217 with an iron saturation of 30%.  Myeloma panel did not show any M protein.  Haptoglobin was normal and TSH was normal.     Patient received 2 doses of Venofer as well as B12 injections in June 2022.    Interval history-patient reports doing well overall.  He is on home peritoneal dialysis and tolerating it well.  Leg swelling has improved.  He has some baseline fatigue but denies other complaints at this time  ECOG PS- 1 Pain scale- 0   Review of systems- Review of Systems  Constitutional:  Positive for malaise/fatigue. Negative for chills, fever and weight loss.  HENT:  Negative for congestion, ear discharge and nosebleeds.   Eyes:  Negative for blurred vision.   Respiratory:  Negative for cough, hemoptysis, sputum production, shortness of breath and wheezing.   Cardiovascular:  Negative for chest pain, palpitations, orthopnea and claudication.  Gastrointestinal:  Negative for abdominal pain, blood in stool, constipation, diarrhea, heartburn, melena, nausea and vomiting.  Genitourinary:  Negative for dysuria, flank pain, frequency, hematuria and urgency.  Musculoskeletal:  Negative for back pain, joint pain and myalgias.  Skin:  Negative for rash.  Neurological:  Negative for dizziness, tingling, focal weakness, seizures, weakness and headaches.  Endo/Heme/Allergies:  Does not bruise/bleed easily.  Psychiatric/Behavioral:  Negative for depression and suicidal ideas. The patient does not have insomnia.      Allergies  Allergen Reactions   Morphine Anxiety    agitation      Past Medical History:  Diagnosis Date   Acute diverticulitis 06/14/2014   Anemia    Anginal pain (HCC)    Aortic atherosclerosis (HCC)    Atrial fibrillation (HCC)    CKD (chronic kidney disease) stage 3, GFR 30-59 ml/min (HCC)    Coronary artery disease    Depression    Diabetes mellitus without complication (HCC)    Diverticulosis    GERD (gastroesophageal reflux disease)    GI bleed 05/15/2019   Gout    Heart murmur    Hematemesis 05/15/2019   High cholesterol    Hypertension    STEMI (ST elevation myocardial infarction) ([place]) 2003   Inferior     Past Surgical History:  Procedure Laterality Date   CAPD INSERTION N/A 08/13/2020   Procedure: LAPAROSCOPIC INSERTION CONTINUOUS AMBULATORY PERITONEAL DIALYSIS  (CAPD)  CATHETER;  Surgeon: Ronny Bacon, MD;  Location: [facility] ORS;  Service: General;  Laterality: N/A;   CARDIAC CATHETERIZATION     CATARACT EXTRACTION W/PHACO Right 07/05/2019   Procedure: CATARACT EXTRACTION PHACO AND INTRAOCULAR LENS PLACEMENT ([place]) RIGHT DIABETIC;  Surgeon: Birder Robson, MD;  Location: [facility] ORS;  Service: Ophthalmology;   Laterality: Right;  US00:59.3 CDE8.41 LOT [id number] h   COLONOSCOPY WITH PROPOFOL N/A 04/01/2017   Procedure: COLONOSCOPY WITH PROPOFOL;  Surgeon: Lollie Sails, MD;  Location: [facility] ENDOSCOPY;  Service: Endoscopy;  Laterality: N/A;   CORONARY ANGIOPLASTY WITH STENT PLACEMENT  10/2001   RCA   CORONARY ANGIOPLASTY WITH STENT PLACEMENT  07/2015   LAD   right hand surgery  2015   3 screws in right hand   TOTAL HIP ARTHROPLASTY Left 04/22/2020   Procedure: TOTAL HIP ARTHROPLASTY ANTERIOR APPROACH;  Surgeon: Hessie Knows, MD;  Location: [facility] ORS;  Service: Orthopedics;  Laterality: Left;    Social History   Socioeconomic History   Marital status: Married    Spouse name: Sherri   Number of children: 2   Years of education: Not on file   Highest education level: Not on file  Occupational History   Not on file  Tobacco Use   Smoking status: Former    Types: Cigarettes    Quit date: 03/24/2001    Years since quitting: 19.7   Smokeless tobacco: Never  Vaping Use   Vaping Use: Never used  Substance and Sexual Activity   Alcohol use: No    Alcohol/week: 0.0 standard drinks   Drug use: No   Sexual activity: Not on file  Other Topics Concern   Not on file  Social History Narrative   Not on file   Social Determinants of Health   Financial Resource Strain: Not on file  Food Insecurity: Not on file  Transportation Needs: Not on file  Physical Activity: Not on file  Stress: Not on file  Social Connections: Not on file  Intimate Partner Violence: Not on file    Family History  Problem Relation Age of Onset   Heart disease Mother    Heart disease Father    CAD Other      Current Outpatient Medications:    allopurinol (ZYLOPRIM) 100 MG tablet, Take 100 mg by mouth daily., Disp: , Rfl:    amLODipine (NORVASC) 10 MG tablet, Take 10 mg by mouth daily., Disp: , Rfl:    apixaban (ELIQUIS) 2.5 MG TABS tablet, Take 2.5 mg by mouth 2 (two) times daily., Disp: , Rfl:    aspirin 81  MG EC tablet, Take 81 mg by mouth daily., Disp: , Rfl:    calcitRIOL (ROCALTROL) 0.25 MCG capsule, Take 0.25 mcg by mouth daily., Disp: , Rfl:    folic acid (FOLVITE) 1 MG tablet, TAKE 2 TABLETS(2 MG) BY MOUTH DAILY, Disp: 180 tablet, Rfl: 2   gabapentin (NEURONTIN) 300 MG capsule, Take 300 mg by mouth at bedtime., Disp: , Rfl: 11   losartan (COZAAR) 100 MG tablet, Take 100 mg by mouth daily., Disp: , Rfl:    methocarbamol (ROBAXIN) 500 MG tablet, Take 500 mg by mouth 3 (three) times daily as needed., Disp: , Rfl:    metoprolol succinate (TOPROL-XL) 100 MG 24 hr tablet, Take 150 mg by mouth daily. Take with or immediately following a meal., Disp: , Rfl:    omeprazole (PRILOSEC) 20 MG capsule, Take 20 mg by mouth daily., Disp: , Rfl:    psyllium (METAMUCIL) 58.6 %  packet, Take 1 packet by mouth 2 (two) times daily., Disp: , Rfl:    rosuvastatin (CRESTOR) 40 MG tablet, Take 40 mg by mouth daily., Disp: , Rfl:    Vitamin D, Ergocalciferol, (DRISDOL) 1.25 MG (50000 UNIT) CAPS capsule, Take 50,000 Units by mouth once a week., Disp: , Rfl:    fexofenadine (ALLEGRA) 180 MG tablet, Take 180 mg by mouth daily as needed for allergies or rhinitis. (Patient not taking: Reported on 12/16/2020), Disp: , Rfl:    fluticasone (FLONASE) 50 MCG/ACT nasal spray, Place 2 sprays into both nostrils daily as needed for allergies. (Patient not taking: Reported on 12/16/2020), Disp: , Rfl:    HYDROcodone-acetaminophen (NORCO/VICODIN) 5-325 MG tablet, Take 1 tablet by mouth every 6 (six) hours as needed for moderate pain. (Patient not taking: Reported on 12/16/2020), Disp: 15 tablet, Rfl: 0   hydrocortisone (ANUSOL-HC) 2.5 % rectal cream, Place 1 application rectally 2 (two) times daily as needed for hemorrhoids or anal itching., Disp: , Rfl:    isosorbide mononitrate (IMDUR) 60 MG 24 hr tablet, Take 60 mg by mouth daily., Disp: , Rfl:    nitroGLYCERIN (NITROSTAT) 0.4 MG SL tablet, Place 0.4 mg under the tongue every 5 (five)  minutes as needed for chest pain. (Patient not taking: Reported on 12/16/2020), Disp: , Rfl:    torsemide (DEMADEX) 10 MG tablet, Take 1 tablet (10 mg total) by mouth daily. Hold this medication until you see your nephrologist (Patient not taking: Reported on 12/16/2020), Disp: , Rfl:   Physical exam:  Vitals:   12/16/20 1343  BP: 134/88  Pulse: 71  Resp: 18  Temp: (!) 96.8 F (36 C)  SpO2: 100%  Weight: 145 lb 3.2 oz (65.9 kg)   Physical Exam Cardiovascular:     Rate and Rhythm: Normal rate and regular rhythm.     Heart sounds: Normal heart sounds.  Pulmonary:     Effort: Pulmonary effort is normal.     Breath sounds: Normal breath sounds.  Musculoskeletal:     Comments: Trace peripheral edema  Skin:    General: Skin is warm and dry.  Neurological:     Mental Status: He is alert and oriented to person, place, and time.     CMP Latest Ref Rng & Units 12/16/2020  Glucose 70 - 99 mg/dL 92  BUN 8 - 23 mg/dL 34(H)  Creatinine 0.61 - 1.24 mg/dL 3.94(H)  Sodium 135 - 145 mmol/L 141  Potassium 3.5 - 5.1 mmol/L 4.5  Chloride 98 - 111 mmol/L 106  CO2 22 - 32 mmol/L 27  Calcium 8.9 - 10.3 mg/dL 8.2(L)  Total Protein 6.5 - 8.1 g/dL 5.8(L)  Total Bilirubin 0.3 - 1.2 mg/dL 0.2(L)  Alkaline Phos 38 - 126 U/L 106  AST 15 - 41 U/L 14(L)  ALT 0 - 44 U/L 11   CBC Latest Ref Rng & Units 12/16/2020  WBC 4.0 - 10.5 K/uL 6.3  Hemoglobin 13.0 - 17.0 g/dL 11.8(L)  Hematocrit 39.0 - 52.0 % 37.0(L)  Platelets 150 - 400 K/uL 173    No images are attached to the encounter.  DG Chest 2 View  Result Date: 12/02/2020 CLINICAL DATA:  Chest pain. EXAM: CHEST - 2 VIEW COMPARISON:  February 02, 2020. FINDINGS: The heart size and mediastinal contours are within normal limits. Left lung is clear. Mild right basilar subsegmental atelectasis is noted. The visualized skeletal structures are unremarkable. IMPRESSION: Mild right basilar subsegmental atelectasis. Electronically Signed   By: Marijo Conception  M.D.   On: 12/02/2020 08:52   ECHOCARDIOGRAM COMPLETE  Result Date: 12/03/2020    ECHOCARDIOGRAM REPORT   Patient Name:   CRAVEN CREAN Date of Exam: 12/03/2020 Medical Rec #:  [id number]           Height:       69.0 in Accession #:    [id number]          Weight:       143.5 lb Date of Birth:  1950/03/18           BSA:          1.794 m Patient Age:    70 years            BP:           108/76 mmHg Patient Gender: M                   HR:           67 bpm. Exam Location:  [facility] Procedure: 2D Echo, Color Doppler, Cardiac Doppler and Intracardiac            Opacification Agent Indications:     R07.9 Chest Pain  History:         Patient has prior history of Echocardiogram examinations, most                  recent 02/04/2020. CAD, ESRD; Risk Factors:Hypertension,                  Diabetes and HCL.  Sonographer:     Charmayne Sheer Referring Phys:  [id number] AMRIT ADHIKARI Diagnosing Phys: Isaias Cowman MD  Sonographer Comments: Technically difficult study due to poor echo windows. IMPRESSIONS  1. Left ventricular ejection fraction, by estimation, is 60 to 65%. The left ventricle has normal function. The left ventricle has no regional wall motion abnormalities. Left ventricular diastolic parameters were normal.  2. Right ventricular systolic function is normal. The right ventricular size is normal.  3. The mitral valve is normal in structure. Trivial mitral valve regurgitation. No evidence of mitral stenosis.  4. The aortic valve is normal in structure. Aortic valve regurgitation is not visualized. No aortic stenosis is present.  5. The inferior vena cava is normal in size with greater than 50% respiratory variability, suggesting right atrial pressure of 3 mmHg. FINDINGS  Left Ventricle: Left ventricular ejection fraction, by estimation, is 60 to 65%. The left ventricle has normal function. The left ventricle has no regional wall motion abnormalities. Definity contrast agent was given IV to delineate the left  ventricular  endocardial borders. The left ventricular internal cavity size was normal in size. There is no left ventricular hypertrophy. Left ventricular diastolic parameters were normal. Right Ventricle: The right ventricular size is normal. No increase in right ventricular wall thickness. Right ventricular systolic function is normal. Left Atrium: Left atrial size was normal in size. Right Atrium: Right atrial size was normal in size. Pericardium: There is no evidence of pericardial effusion. Mitral Valve: The mitral valve is normal in structure. Trivial mitral valve regurgitation. No evidence of mitral valve stenosis. MV peak gradient, 2.8 mmHg. The mean mitral valve gradient is 1.0 mmHg. Tricuspid Valve: The tricuspid valve is normal in structure. Tricuspid valve regurgitation is trivial. No evidence of tricuspid stenosis. Aortic Valve: The aortic valve is normal in structure. Aortic valve regurgitation is not visualized. No aortic stenosis is present. Aortic valve mean gradient measures 4.0 mmHg. Aortic valve peak  gradient measures 5.9 mmHg. Aortic valve area, by VTI measures 3.14 cm. Pulmonic Valve: The pulmonic valve was normal in structure. Pulmonic valve regurgitation is not visualized. No evidence of pulmonic stenosis. Aorta: The aortic root is normal in size and structure. Venous: The inferior vena cava is normal in size with greater than 50% respiratory variability, suggesting right atrial pressure of 3 mmHg. IAS/Shunts: No atrial level shunt detected by color flow Doppler.  LEFT VENTRICLE PLAX 2D LVIDd:         4.00 cm   Diastology LVIDs:         2.20 cm   LV e' medial:    6.20 cm/s LV PW:         0.80 cm   LV E/e' medial:  10.3 LV IVS:        0.70 cm   LV e' lateral:   8.27 cm/s LVOT diam:     2.30 cm   LV E/e' lateral: 7.7 LV SV:         75 LV SV Index:   42 LVOT Area:     4.15 cm  RIGHT VENTRICLE RV Basal diam:  4.50 cm LEFT ATRIUM           Index        RIGHT ATRIUM           Index LA diam:       3.10 cm 1.73 cm/m   RA Area:     17.00 cm LA Vol (A4C): 42.1 ml 23.46 ml/m  RA Volume:   50.60 ml  28.20 ml/m  AORTIC VALVE AV Area (Vmax):    3.50 cm AV Area (Vmean):   3.34 cm AV Area (VTI):     3.14 cm AV Vmax:           121.00 cm/s AV Vmean:          95.100 cm/s AV VTI:            0.238 m AV Peak Grad:      5.9 mmHg AV Mean Grad:      4.0 mmHg LVOT Vmax:         102.00 cm/s LVOT Vmean:        76.500 cm/s LVOT VTI:          0.180 m LVOT/AV VTI ratio: 0.76  AORTA Ao Root diam: 3.50 cm MITRAL VALVE MV Area (PHT): 3.11 cm    SHUNTS MV Area VTI:   3.63 cm    Systemic VTI:  0.18 m MV Peak grad:  2.8 mmHg    Systemic Diam: 2.30 cm MV Mean grad:  1.0 mmHg MV Vmax:       0.84 m/s MV Vmean:      50.4 cm/s MV Decel Time: 244 msec MV E velocity: 63.80 cm/s MV A velocity: 57.40 cm/s MV E/A ratio:  1.11 Isaias Cowman MD Electronically signed by Isaias Cowman MD Signature Date/Time: 12/03/2020/1:18:53 PM    Final      Assessment and plan- Patient is a 70 y.o. male with anemia of chronic kidney disease here for routine follow-up  Patient's hemoglobin was close to 7.3 in August 2022.  Around the same time he started dialysis and was also given IV iron and subsequently started on Retacrit 40,000 units every 3 weeks.He received 3 doses in August September and October 2022.  Subsequently his hemoglobin improved and is remained more than 11.  He therefore does not require any Retacrit at this time.  Continue H&H and possible Retacrit every 3 weeks and I will see him back in 12 weeks with CBC ferritin and iron studies   Visit Diagnosis 1. Anemia of chronic kidney failure, stage 4 (severe) (HCC)      Dr. Randa Evens, MD, MPH [facility] [phone] 12/16/2020 4:08 PM

## 2021-01-06 ENCOUNTER — Inpatient Hospital Stay: Payer: Medicare PPO

## 2021-01-06 ENCOUNTER — Inpatient Hospital Stay: Payer: Medicare PPO | Attending: Oncology

## 2021-01-20 ENCOUNTER — Other Ambulatory Visit: Payer: Self-pay | Admitting: *Deleted

## 2021-01-20 DIAGNOSIS — D631 Anemia in chronic kidney disease: Secondary | ICD-10-CM

## 2021-01-27 ENCOUNTER — Inpatient Hospital Stay: Payer: Medicare PPO

## 2021-01-27 ENCOUNTER — Inpatient Hospital Stay: Payer: Medicare PPO | Attending: Oncology

## 2021-02-17 ENCOUNTER — Inpatient Hospital Stay: Payer: Medicare PPO

## 2021-03-06 ENCOUNTER — Other Ambulatory Visit: Payer: Self-pay | Admitting: *Deleted

## 2021-03-06 DIAGNOSIS — D631 Anemia in chronic kidney disease: Secondary | ICD-10-CM

## 2021-03-06 DIAGNOSIS — R972 Elevated prostate specific antigen [PSA]: Secondary | ICD-10-CM

## 2021-03-07 ENCOUNTER — Other Ambulatory Visit: Payer: Self-pay

## 2021-03-07 ENCOUNTER — Inpatient Hospital Stay
Admission: EM | Admit: 2021-03-07 | Discharge: 2021-03-10 | DRG: 919 | Disposition: A | Discharge status: Home / Self Care | Payer: Medicare Other | Attending: Internal Medicine | Admitting: Internal Medicine

## 2021-03-07 DIAGNOSIS — N2581 Secondary hyperparathyroidism of renal origin: Secondary | ICD-10-CM | POA: Diagnosis present

## 2021-03-07 DIAGNOSIS — T85631A Leakage of intraperitoneal dialysis catheter, initial encounter: Principal | ICD-10-CM | POA: Diagnosis present

## 2021-03-07 DIAGNOSIS — I252 Old myocardial infarction: Secondary | ICD-10-CM | POA: Diagnosis not present

## 2021-03-07 DIAGNOSIS — Z7982 Long term (current) use of aspirin: Secondary | ICD-10-CM | POA: Diagnosis not present

## 2021-03-07 DIAGNOSIS — I480 Paroxysmal atrial fibrillation: Secondary | ICD-10-CM | POA: Diagnosis not present

## 2021-03-07 DIAGNOSIS — E785 Hyperlipidemia, unspecified: Secondary | ICD-10-CM | POA: Diagnosis present

## 2021-03-07 DIAGNOSIS — Z7901 Long term (current) use of anticoagulants: Secondary | ICD-10-CM | POA: Diagnosis not present

## 2021-03-07 DIAGNOSIS — I120 Hypertensive chronic kidney disease with stage 5 chronic kidney disease or end stage renal disease: Secondary | ICD-10-CM | POA: Diagnosis not present

## 2021-03-07 DIAGNOSIS — E1122 Type 2 diabetes mellitus with diabetic chronic kidney disease: Secondary | ICD-10-CM | POA: Diagnosis present

## 2021-03-07 DIAGNOSIS — Z992 Dependence on renal dialysis: Secondary | ICD-10-CM | POA: Diagnosis not present

## 2021-03-07 DIAGNOSIS — E119 Type 2 diabetes mellitus without complications: Secondary | ICD-10-CM

## 2021-03-07 DIAGNOSIS — Z79899 Other long term (current) drug therapy: Secondary | ICD-10-CM | POA: Diagnosis not present

## 2021-03-07 DIAGNOSIS — I2510 Atherosclerotic heart disease of native coronary artery without angina pectoris: Secondary | ICD-10-CM | POA: Diagnosis present

## 2021-03-07 DIAGNOSIS — T85611A Breakdown (mechanical) of intraperitoneal dialysis catheter, initial encounter: Secondary | ICD-10-CM | POA: Diagnosis present

## 2021-03-07 DIAGNOSIS — E7800 Pure hypercholesterolemia, unspecified: Secondary | ICD-10-CM | POA: Diagnosis not present

## 2021-03-07 DIAGNOSIS — Z20822 Contact with and (suspected) exposure to covid-19: Secondary | ICD-10-CM | POA: Diagnosis present

## 2021-03-07 DIAGNOSIS — I700 Atherosclerosis of aorta: Secondary | ICD-10-CM | POA: Diagnosis not present

## 2021-03-07 DIAGNOSIS — I10 Essential (primary) hypertension: Secondary | ICD-10-CM | POA: Diagnosis present

## 2021-03-07 DIAGNOSIS — Z8249 Family history of ischemic heart disease and other diseases of the circulatory system: Secondary | ICD-10-CM

## 2021-03-07 DIAGNOSIS — Z96642 Presence of left artificial hip joint: Secondary | ICD-10-CM | POA: Diagnosis present

## 2021-03-07 DIAGNOSIS — D631 Anemia in chronic kidney disease: Secondary | ICD-10-CM | POA: Diagnosis present

## 2021-03-07 DIAGNOSIS — K5730 Diverticulosis of large intestine without perforation or abscess without bleeding: Secondary | ICD-10-CM | POA: Diagnosis present

## 2021-03-07 DIAGNOSIS — Z87891 Personal history of nicotine dependence: Secondary | ICD-10-CM | POA: Diagnosis not present

## 2021-03-07 DIAGNOSIS — K219 Gastro-esophageal reflux disease without esophagitis: Secondary | ICD-10-CM | POA: Diagnosis not present

## 2021-03-07 DIAGNOSIS — Y812 Prosthetic and other implants, materials and accessory general- and plastic-surgery devices associated with adverse incidents: Secondary | ICD-10-CM | POA: Diagnosis present

## 2021-03-07 DIAGNOSIS — N189 Chronic kidney disease, unspecified: Secondary | ICD-10-CM | POA: Diagnosis present

## 2021-03-07 DIAGNOSIS — Z885 Allergy status to narcotic agent status: Secondary | ICD-10-CM

## 2021-03-07 DIAGNOSIS — E875 Hyperkalemia: Secondary | ICD-10-CM | POA: Diagnosis not present

## 2021-03-07 DIAGNOSIS — N186 End stage renal disease: Secondary | ICD-10-CM | POA: Diagnosis not present

## 2021-03-07 LAB — BASIC METABOLIC PANEL
Anion gap: 7 (ref 5–15)
BUN: 35 mg/dL — ABNORMAL HIGH (ref 8–23)
CO2: 23 mmol/L (ref 22–32)
Calcium: 8.4 mg/dL — ABNORMAL LOW (ref 8.9–10.3)
Chloride: 109 mmol/L (ref 98–111)
Creatinine, Ser: 4.46 mg/dL — ABNORMAL HIGH (ref 0.61–1.24)
GFR, Estimated: 14 mL/min — ABNORMAL LOW (ref >60)
Glucose, Bld: 125 mg/dL — ABNORMAL HIGH (ref 70–99)
Potassium: 4 mmol/L (ref 3.5–5.1)
Sodium: 139 mmol/L (ref 135–145)

## 2021-03-07 LAB — BODY FLUID CELL COUNT WITH DIFFERENTIAL
Eos, Fluid: 0 %
Lymphs, Fluid: 11 %
Monocyte-Macrophage-Serous Fluid: 67 %
Neutrophil Count, Fluid: 22 %
Total Nucleated Cell Count, Fluid: 52 cu mm

## 2021-03-07 LAB — CBC WITH DIFFERENTIAL/PLATELET
Abs Immature Granulocytes: 0.05 10*3/uL (ref 0.00–0.07)
Basophils Absolute: 0 10*3/uL (ref 0.0–0.1)
Basophils Relative: 1 %
Eosinophils Absolute: 0.1 10*3/uL (ref 0.0–0.5)
Eosinophils Relative: 2 %
HCT: 27.1 % — ABNORMAL LOW (ref 39.0–52.0)
Hemoglobin: 8.7 g/dL — ABNORMAL LOW (ref 13.0–17.0)
Immature Granulocytes: 1 %
Lymphocytes Relative: 13 %
Lymphs Abs: 0.8 10*3/uL (ref 0.7–4.0)
MCH: 31.5 pg (ref 26.0–34.0)
MCHC: 32.1 g/dL (ref 30.0–36.0)
MCV: 98.2 fL (ref 80.0–100.0)
Monocytes Absolute: 0.6 10*3/uL (ref 0.1–1.0)
Monocytes Relative: 9 %
Neutro Abs: 4.7 10*3/uL (ref 1.7–7.7)
Neutrophils Relative %: 74 %
Platelets: 162 10*3/uL (ref 150–400)
RBC: 2.76 MIL/uL — ABNORMAL LOW (ref 4.22–5.81)
RDW: 14.6 % (ref 11.5–15.5)
WBC: 6.3 10*3/uL (ref 4.0–10.5)
nRBC: 0 % (ref 0.0–0.2)

## 2021-03-07 LAB — RESP PANEL BY RT-PCR (FLU A&B, COVID) ARPGX2
Influenza A by PCR: NEGATIVE
Influenza B by PCR: NEGATIVE
SARS Coronavirus 2 by RT PCR: NEGATIVE

## 2021-03-07 LAB — HEPARIN LEVEL (UNFRACTIONATED): Heparin Unfractionated: 0.78 IU/mL — ABNORMAL HIGH (ref 0.30–0.70)

## 2021-03-07 LAB — APTT: aPTT: 44 seconds — ABNORMAL HIGH (ref 24–36)

## 2021-03-07 MED ORDER — ALLOPURINOL 100 MG PO TABS
100.0000 mg | ORAL_TABLET | Freq: Every day | ORAL | Status: DC
  Administered 2021-03-08 – 2021-03-10 (×2): 100 mg via ORAL
  Filled 2021-03-07 (×2): qty 1

## 2021-03-07 MED ORDER — ALBUTEROL SULFATE (2.5 MG/3ML) 0.083% IN NEBU: 2.5000 mg | INHALATION_SOLUTION | Freq: Four times a day (QID) | RESPIRATORY_TRACT | Status: DC | PRN

## 2021-03-07 MED ORDER — VANCOMYCIN HCL IN DEXTROSE 1-5 GM/200ML-% IV SOLN
1000.0000 mg | Freq: Once | INTRAVENOUS | Status: AC
  Administered 2021-03-07: 1000 mg via INTRAVENOUS
  Filled 2021-03-07: qty 200

## 2021-03-07 MED ORDER — ACETAMINOPHEN 325 MG PO TABS: 650.0000 mg | ORAL_TABLET | Freq: Four times a day (QID) | ORAL | Status: DC | PRN

## 2021-03-07 MED ORDER — AMLODIPINE BESYLATE 10 MG PO TABS
10.0000 mg | ORAL_TABLET | Freq: Every day | ORAL | Status: DC
  Administered 2021-03-08 – 2021-03-10 (×2): 10 mg via ORAL
  Filled 2021-03-07 (×2): qty 1

## 2021-03-07 MED ORDER — ACETAMINOPHEN 650 MG RE SUPP: 650.0000 mg | Freq: Four times a day (QID) | RECTAL | Status: DC | PRN

## 2021-03-07 MED ORDER — DELFLEX-LC/1.5% DEXTROSE 344 MOSM/L IP SOLN
INTRAPERITONEAL | Status: DC
  Filled 2021-03-07 (×4): qty 3000

## 2021-03-07 MED ORDER — ISOSORBIDE MONONITRATE ER 30 MG PO TB24
60.0000 mg | ORAL_TABLET | Freq: Every day | ORAL | Status: DC
  Administered 2021-03-08 – 2021-03-10 (×2): 60 mg via ORAL
  Filled 2021-03-07 (×2): qty 2

## 2021-03-07 MED ORDER — LOSARTAN POTASSIUM 50 MG PO TABS: 100.0000 mg | ORAL_TABLET | Freq: Every day | ORAL | Status: DC

## 2021-03-07 MED ORDER — GABAPENTIN 300 MG PO CAPS
300.0000 mg | ORAL_CAPSULE | Freq: Every day | ORAL | Status: DC
  Administered 2021-03-07 – 2021-03-09 (×3): 300 mg via ORAL
  Filled 2021-03-07 (×3): qty 1

## 2021-03-07 MED ORDER — SODIUM CHLORIDE 0.9 % IV SOLN
1.0000 g | Freq: Once | INTRAVENOUS | Status: AC
  Administered 2021-03-08: 1 g via INTRAVENOUS
  Filled 2021-03-07: qty 1

## 2021-03-07 MED ORDER — CALCITRIOL 0.25 MCG PO CAPS
0.2500 ug | ORAL_CAPSULE | Freq: Every day | ORAL | Status: DC
  Administered 2021-03-08 – 2021-03-10 (×2): 0.25 ug via ORAL
  Filled 2021-03-07 (×3): qty 1

## 2021-03-07 MED ORDER — GENTAMICIN SULFATE 0.1 % EX CREA
1.0000 "application " | TOPICAL_CREAM | Freq: Every day | CUTANEOUS | Status: DC
  Administered 2021-03-09 – 2021-03-10 (×2): 1 via TOPICAL
  Filled 2021-03-07: qty 15

## 2021-03-07 MED ORDER — ROSUVASTATIN CALCIUM 10 MG PO TABS
40.0000 mg | ORAL_TABLET | Freq: Every day | ORAL | Status: DC
  Administered 2021-03-08 – 2021-03-10 (×2): 40 mg via ORAL
  Filled 2021-03-07 (×2): qty 4

## 2021-03-07 MED ORDER — METOPROLOL SUCCINATE ER 50 MG PO TB24
150.0000 mg | ORAL_TABLET | Freq: Every day | ORAL | Status: DC
  Administered 2021-03-08 – 2021-03-10 (×2): 150 mg via ORAL
  Filled 2021-03-07 (×2): qty 1

## 2021-03-07 MED ORDER — PANTOPRAZOLE SODIUM 40 MG PO TBEC
40.0000 mg | DELAYED_RELEASE_TABLET | Freq: Every day | ORAL | Status: DC
  Administered 2021-03-08 – 2021-03-10 (×2): 40 mg via ORAL
  Filled 2021-03-07 (×2): qty 1

## 2021-03-07 MED ORDER — METHOCARBAMOL 500 MG PO TABS: 500.0000 mg | ORAL_TABLET | Freq: Three times a day (TID) | ORAL | Status: DC | PRN

## 2021-03-07 MED ORDER — SODIUM CHLORIDE 0.9 % IV SOLN
1.0000 g | Freq: Once | INTRAVENOUS | Status: AC
  Administered 2021-03-07: 1 g via INTRAVENOUS
  Filled 2021-03-07: qty 10

## 2021-03-07 MED ORDER — HEPARIN (PORCINE) 25000 UT/250ML-% IV SOLN
1000.0000 [IU]/h | INTRAVENOUS | Status: DC
  Administered 2021-03-07: 1000 [IU]/h via INTRAVENOUS
  Filled 2021-03-07: qty 250

## 2021-03-07 MED ORDER — SODIUM CHLORIDE 0.9% FLUSH
3.0000 mL | Freq: Two times a day (BID) | INTRAVENOUS | Status: DC
  Administered 2021-03-07 – 2021-03-10 (×4): 3 mL via INTRAVENOUS

## 2021-03-07 NOTE — Plan of Care (Signed)

## 2021-03-07 NOTE — ED Notes (Signed)
Transport came down to get PT came back and said "Pt said he would like a minute to eat. Then she stated she didn't want to take food in the elevator with her. RN notified.

## 2021-03-07 NOTE — Progress Notes (Signed)
[facility]  ROUNDING NOTE   Subjective:   [name] was admitted to [facility] on 03/07/2021 for Peritoneal dialysis catheter dysfunction [facility]) [Z61.096E]  Last hemodialysis treatment was last night. When patient's wife was changing the dressings, she accidentally cut the PD catheter line.   Dr. Lysle Pearl has applied dermabond and tegaderm to the catheter and flushed it with no evidence of leak.   Empiric ceftriaxone and vancomycin ordered.    Objective:  Vital signs in last 24 hours:  Temp:  [98 F (36.7 C)] 98 F (36.7 C) (02/11 1334) Pulse Rate:  [74] 74 (02/11 1334) Resp:  [16] 16 (02/11 1334) BP: (136)/(75) 136/75 (02/11 1334) SpO2:  [96 %] 96 % (02/11 1334) Weight:  [68 kg] 68 kg (02/11 1430)  Weight change:  Filed Weights   03/07/21 1430  Weight: 68 kg    Intake/Output: No intake/output data recorded.   Intake/Output this shift:  Total I/O In: 300 [IV Piggyback:300] Out: -   Physical Exam: General: NAD, sitting up in stretcher  Head: Normocephalic, atraumatic. Moist oral mucosal membranes  Eyes: Anicteric, PERRL  Neck: Supple, trachea midline  Lungs:  Clear to auscultation  Heart: Regular rate and rhythm  Abdomen:  Soft, nontender,   Extremities:  no peripheral edema.  Neurologic: Nonfocal, moving all four extremities  Skin: No lesions  Access: PD catheter - exit site and tunnel wnl    Basic Metabolic Panel: Recent Labs  Lab 03/07/21 1426  NA 139  K 4.0  CL 109  CO2 23  GLUCOSE 125*  BUN 35*  CREATININE 4.46*  CALCIUM 8.4*    Liver Function Tests: No results for input(s): AST, ALT, ALKPHOS, BILITOT, PROT, ALBUMIN in the last 168 hours. No results for input(s): LIPASE, AMYLASE in the last 168 hours. No results for input(s): AMMONIA in the last 168 hours.  CBC: Recent Labs  Lab 03/07/21 1426  WBC 6.3  NEUTROABS 4.7  HGB 8.7*  HCT 27.1*  MCV 98.2  PLT 162    Cardiac Enzymes: No results for input(s): CKTOTAL,  CKMB, CKMBINDEX, TROPONINI in the last 168 hours.  BNP: Invalid input(s): POCBNP  CBG: No results for input(s): GLUCAP in the last 168 hours.  Microbiology: Results for orders placed or performed during the hospital encounter of 03/07/21  Resp Panel by RT-PCR (Flu A&B, Covid) Nasopharyngeal Swab     Status: None   Collection Time: 03/07/21  2:26 PM   Specimen: Nasopharyngeal Swab; Nasopharyngeal(NP) swabs in vial transport medium  Result Value Ref Range Status   SARS Coronavirus 2 by RT PCR NEGATIVE NEGATIVE Final    Comment: (NOTE) SARS-CoV-2 target nucleic acids are NOT DETECTED.  The SARS-CoV-2 RNA is generally detectable in upper respiratory specimens during the acute phase of infection. The lowest concentration of SARS-CoV-2 viral copies this assay can detect is 138 copies/mL. A negative result does not preclude SARS-Cov-2 infection and should not be used as the sole basis for treatment or other patient management decisions. A negative result may occur with  improper specimen collection/handling, submission of specimen other than nasopharyngeal swab, presence of viral mutation(s) within the areas targeted by this assay, and inadequate number of viral copies(<138 copies/mL). A negative result must be combined with clinical observations, patient history, and epidemiological information. The expected result is Negative.  Fact Sheet for Patients:  EntrepreneurPulse.com.au  Fact Sheet for Healthcare Providers:  IncredibleEmployment.be  This test is no t yet approved or cleared by the [place] and  has been authorized for detection and/or diagnosis of SARS-CoV-2 by FDA under an Emergency Use Authorization (EUA). This EUA will remain  in effect (meaning this test can be used) for the duration of the COVID-19 declaration under Section 564(b)(1) of the Act, 21 U.S.C.section 360bbb-3(b)(1), unless the authorization is terminated  or  revoked sooner.       Influenza A by PCR NEGATIVE NEGATIVE Final   Influenza B by PCR NEGATIVE NEGATIVE Final    Comment: (NOTE) The Xpert Xpress SARS-CoV-2/FLU/RSV plus assay is intended as an aid in the diagnosis of influenza from Nasopharyngeal swab specimens and should not be used as a sole basis for treatment. Nasal washings and aspirates are unacceptable for Xpert Xpress SARS-CoV-2/FLU/RSV testing.  Fact Sheet for Patients: EntrepreneurPulse.com.au  Fact Sheet for Healthcare Providers: IncredibleEmployment.be  This test is not yet approved or cleared by the [place] FDA and has been authorized for detection and/or diagnosis of SARS-CoV-2 by FDA under an Emergency Use Authorization (EUA). This EUA will remain in effect (meaning this test can be used) for the duration of the COVID-19 declaration under Section 564(b)(1) of the Act, 21 U.S.C. section 360bbb-3(b)(1), unless the authorization is terminated or revoked.  Performed at [facility], [address] [place], [place] [address]     Coagulation Studies: No results for input(s): LABPROT, INR in the last 72 hours.  Urinalysis: No results for input(s): COLORURINE, LABSPEC, PHURINE, GLUCOSEU, HGBUR, BILIRUBINUR, KETONESUR, PROTEINUR, UROBILINOGEN, NITRITE, LEUKOCYTESUR in the last 72 hours.  Invalid input(s): APPERANCEUR    Imaging: No results found.   Medications:     sodium chloride flush  3 mL Intravenous Q12H   acetaminophen **OR** acetaminophen, albuterol  Assessment/ Plan:  [name] is a 71 y.o. White male with end stage renal disease on peritoneal dialysis, atrial fibrillation, coronary artery disease, diabetes mellitus type II, diverticulosis, gout, hyperlipidemia who presents to [facility] on 03/07/2021 for Peritoneal dialysis catheter dysfunction [facility]) [T85.611A]  [facility] 67kg CCPD 9 hours 4 exchanges 2520mL fills   End stage  renal disease on peritoneal dialysis: now with complication of dialysis device. Last peritoneal dialysis was last night. Will prepare for peritoneal dialysis tonight.  - Orders prepared.  - Appreciate surgery input. Plan for PD catheter revision once apixaban has been washed out.  - Empiric ceftriaxone 1g daily and vancomycin 1g three times a week.  - Send cell count and diff and culture  Hypertension: blood pressure 136/75. Home regimen of amlodipine, losartan, isosorbide mononitrate, metoprolol and torsemide.   Anemia of chronic kidney disease: hemoglobin 8.7. Mircera as outpatient.   Secondary Hyperparathyroidism: outpatient PTH, calcium and phos at goal.    LOS: 0 Dijon Cosens 2/11/20234:07 PM

## 2021-03-07 NOTE — Assessment & Plan Note (Addendum)
Diet controlled.  Currently not on any medication for treatment.

## 2021-03-07 NOTE — Assessment & Plan Note (Signed)
Home medication regimen includes amlodipine 10 mg daily, isosorbide mononitrate 60 mg daily, losartan 100 mg daily, and metoprolol 150 mg daily.

## 2021-03-07 NOTE — Assessment & Plan Note (Addendum)
Acute on chronic.  On admission hemoglobin 8.7 g/dL, but previously had been 11.8 back in 11/2020.  Patient has prior history of GI bleed, but denies any reports of bleeding or black/dark stools. --Monitor CBC in follow up

## 2021-03-07 NOTE — H&P (Addendum)
History and Physical    Patient: Casey French WFU:[id number] DOB: 1950/10/24 DOA: 03/07/2021 DOS: the patient was seen and examined on 03/07/2021 PCP: [name], MD  Patient coming from: Home  Chief Complaint:  Chief Complaint  Patient presents with   Vascular Access Problem    HPI: Casey French is a 71 y.o. male with medical history significant of hypertension, hyperlipidemia, atrial fibrillation on Eliquis, CAD, DM type II,  ESRD on PD, and GI bleed who presents with complaints of peritoneal dialysis catheter leaking.  History is obtained from the patient present at bedside.  Last night while his wife accidentally cut the catheter while she was attempting to change the dressing.  Thereafter started leaking and had to be clamped for which he was unable to have dialysis last night.  Denies have any significant fever, chills, shortness of breath, chest pain, nausea, vomiting, leg swelling, redness or irritation around PD catheter site, or complaints of blood in stool.  Upon admission to the emergency department patient was noted to be afebrile with stable vital signs.  Labs significant for hemoglobin 8.7, potassium 4, BUN 35, creatinine 4.46, and glucose 125.  Nephrology and general surgery was consulted.  General surgery placed Dermabond and Tegaderm over the area that was leaking and was able to manually flush and pull from the catheter without signs of leaking.  TRH called to admit overnight for with plans to attempt dialysis.    If successful patient may be able to be discharged tomorrow. He has been given empiric antibiotics of vancomycin and cefepime x1 dose.  Review of Systems: As mentioned in the history of present illness. All other systems reviewed and are negative. Past Medical History:  Diagnosis Date   Acute diverticulitis 06/14/2014   Anemia    Anginal pain (HCC)    Aortic atherosclerosis (HCC)    Atrial fibrillation (HCC)    CKD (chronic kidney disease) stage  3, GFR 30-59 ml/min (HCC)    Coronary artery disease    Depression    Diabetes mellitus without complication (HCC)    Diverticulosis    GERD (gastroesophageal reflux disease)    GI bleed 05/15/2019   Gout    Heart murmur    Hematemesis 05/15/2019   High cholesterol    Hypertension    STEMI (ST elevation myocardial infarction) ([place]) 2003   Inferior   Past Surgical History:  Procedure Laterality Date   CAPD INSERTION N/A 08/13/2020   Procedure: LAPAROSCOPIC INSERTION CONTINUOUS AMBULATORY PERITONEAL DIALYSIS  (CAPD) CATHETER;  Surgeon: Ronny Bacon, MD;  Location: [facility] ORS;  Service: General;  Laterality: N/A;   CARDIAC CATHETERIZATION     CATARACT EXTRACTION W/PHACO Right 07/05/2019   Procedure: CATARACT EXTRACTION PHACO AND INTRAOCULAR LENS PLACEMENT ([place]) RIGHT DIABETIC;  Surgeon: Birder Robson, MD;  Location: [facility] ORS;  Service: Ophthalmology;  Laterality: Right;  US00:59.3 CDE8.41 LOT [id number] h   COLONOSCOPY WITH PROPOFOL N/A 04/01/2017   Procedure: COLONOSCOPY WITH PROPOFOL;  Surgeon: Lollie Sails, MD;  Location: [facility]COPY;  Service: Endoscopy;  Laterality: N/A;   CORONARY ANGIOPLASTY WITH STENT PLACEMENT  10/2001   RCA   CORONARY ANGIOPLASTY WITH STENT PLACEMENT  07/2015   LAD   right hand surgery  2015   3 screws in right hand   TOTAL HIP ARTHROPLASTY Left 04/22/2020   Procedure: TOTAL HIP ARTHROPLASTY ANTERIOR APPROACH;  Surgeon: Hessie Knows, MD;  Location: [facility] ORS;  Service: Orthopedics;  Laterality: Left;   Social History:  reports that he quit smoking  about 19 years ago. His smoking use included cigarettes. He has never used smokeless tobacco. He reports that he does not drink alcohol and does not use drugs.  Allergies  Allergen Reactions   Morphine Anxiety    agitation     Family History  Problem Relation Age of Onset   Heart disease Mother    Heart disease Father    CAD Other     Prior to Admission medications   Medication Sig Start Date  End Date Taking? Authorizing Provider  allopurinol (ZYLOPRIM) 100 MG tablet Take 100 mg by mouth daily.    [provider]  amLODipine (NORVASC) 10 MG tablet Take 10 mg by mouth daily.    [provider]  apixaban (ELIQUIS) 2.5 MG TABS tablet Take 2.5 mg by mouth 2 (two) times daily.    [provider]  aspirin 81 MG EC tablet Take 81 mg by mouth daily.    [provider]  calcitRIOL (ROCALTROL) 0.25 MCG capsule Take 0.25 mcg by mouth daily. 06/10/20 06/10/21  [provider]  fexofenadine (ALLEGRA) 180 MG tablet Take 180 mg by mouth daily as needed for allergies or rhinitis. Patient not taking: Reported on 12/16/2020    [provider]  fluticasone (FLONASE) 50 MCG/ACT nasal spray Place 2 sprays into both nostrils daily as needed for allergies. Patient not taking: Reported on 12/16/2020 04/30/14   [provider]  folic acid (FOLVITE) 1 MG tablet TAKE 2 TABLETS(2 MG) BY MOUTH DAILY 06/18/20   Casey Guadeloupe, MD  gabapentin (NEURONTIN) 300 MG capsule Take 300 mg by mouth at bedtime. 09/25/16   [provider]  HYDROcodone-acetaminophen (NORCO/VICODIN) 5-325 MG tablet Take 1 tablet by mouth every 6 (six) hours as needed for moderate pain. Patient not taking: Reported on 12/16/2020 08/13/20   Ronny Bacon, MD  hydrocortisone (ANUSOL-HC) 2.5 % rectal cream Place 1 application rectally 2 (two) times daily as needed for hemorrhoids or anal itching.    [provider]  isosorbide mononitrate (IMDUR) 60 MG 24 hr tablet Take 60 mg by mouth daily.    [provider]  losartan (COZAAR) 100 MG tablet Take 100 mg by mouth daily. 04/28/20   [provider]  methocarbamol (ROBAXIN) 500 MG tablet Take 500 mg by mouth 3 (three) times daily as needed. 11/13/20   [provider]  metoprolol succinate (TOPROL-XL) 100 MG 24 hr tablet Take 150 mg by mouth daily. Take with or immediately following a meal.    [provider]  nitroGLYCERIN (NITROSTAT) 0.4 MG SL tablet Place 0.4 mg under the tongue every 5 (five) minutes as needed for chest pain. Patient not taking: Reported on 12/16/2020    [provider]  omeprazole (PRILOSEC) 20 MG capsule Take 20 mg by mouth daily.    [provider]  psyllium (METAMUCIL) 58.6 % packet Take 1 packet by mouth 2 (two) times daily.    [provider]  rosuvastatin (CRESTOR) 40 MG tablet Take 40 mg by mouth daily. 03/06/19   [provider]  torsemide (DEMADEX) 10 MG tablet Take 1 tablet (10 mg total) by mouth daily. Hold this medication until you see your nephrologist Patient not taking: Reported on 12/16/2020 02/08/20   Wyvonnia Dusky, MD  Vitamin D, Ergocalciferol, (DRISDOL) 1.25 MG (50000 UNIT) CAPS capsule Take 50,000 Units by mouth once a week. 10/23/20   [provider]    Physical Exam: Vitals:   03/07/21 1334 03/07/21 1430  BP:  136/75   Pulse: 74   Resp: 16   Temp: 98 F (36.7 C)   TempSrc: Oral   SpO2: 96%   Weight:  68 kg  Height:  5\' 9"  (1.753 m)   Constitutional: Elderly male currently in NAD, calm, comfortable Eyes: PERRL, lids and conjunctivae normal ENMT: Mucous membranes are moist.  Neck: normal, supple, no masses, no thyromegaly Respiratory: clear to auscultation bilaterally, no wheezing, no crackles. Normal respiratory effort. No accessory muscle use.  Cardiovascular: Regular rate and rhythm, no murmurs / rubs / gallops. No extremity edema. 2+ pedal pulses.   Abdomen: no tenderness, no masses palpated.  Bowel sounds positive. Peritoneal dialysis catheter in place currently wrapped.  Musculoskeletal: no cyanosis. No joint deformity upper and lower extremities. Skin: No erythema appreciated around peritoneal catheter Neurologic: CN 2-12 grossly intact.  Able to move all extremities Psychiatric: Normal judgment and insight. Alert and oriented x 3. Normal mood.     Data Reviewed:  Reviewed  noting acute on chronic anemia with hemoglobin 8.7.  Assessment and Plan: * Peritoneal dialysis catheter dysfunction ([place])  ESRD on peritoneal dialysis- (present on admission) Patient's wife accidentally nicked his peritoneal dialysis catheter last night and was leaking for which she was unable to dialyze.  Patient denied any complaints of shortness of breath, fever, chills, abdominal pain leg swelling, or signs of infection near catheter site.  Labs noted to be stable with potassium 4, BUN 35, and creatinine 4.46.  General surgery and nephrology were consulted in the emergency department.  Peritoneal catheter was patched with Dermabond.  Plan was to admit overnight and attempt dialysis.  Patient had empirically been given 1 dose of vancomycin and Rocephin IV.  Culture and cell count was to be obtained. -Admit to a MedSurg bed -Follow-up cell count and culture  -Continue Rocephin IV   -Appreciate consultative services nephrology and general surgery -Peritoneal dialysis per nephrology  PAF (paroxysmal atrial fibrillation) ([place]) on chronic anticoagulation- (present on admission) Patient appears to be in sinus rhythm at this time and rate controlled.  Home medication regimen includes Eliquis which she last took this morning. -Hold Eliquis in case of need of procedure -Heparin per pharmacy   Anemia in chronic kidney disease- (present on admission) Acute on chronic.  On admission hemoglobin 8.7 g/dL, but previously had been 11.8 back in 11/2020.  Patient has prior history of GI bleed, but denies any reports of bleeding or black/dark stools. -Recheck CBC tomorrow morning  Benign essential HTN- (present on admission) Home medication regimen includes amlodipine 10 mg daily, isosorbide mononitrate 60 mg daily, losartan 100 mg daily, and metoprolol 150 mg daily.  Hyperlipidemia- (present on admission) - Continue Crestor   Advance Care Planning:   Code Status: Full Code   Consults:   nephrology,  general surgery  Family Communication: Wife updated at bedside  Severity of Illness: The appropriate patient status for this patient is OBSERVATION. Observation status is judged to be reasonable and necessary in order to provide the required intensity of service to ensure the patient's safety. The patient's presenting symptoms, physical exam findings, and initial radiographic and laboratory data in the context of their medical condition is felt to place them at decreased risk for further clinical deterioration. Furthermore, it is anticipated that the patient will be medically stable for discharge from the hospital within 2 midnights of admission.   Author: Norval Morton, MD 03/07/2021 3:39 PM  For on call review [email or web address].

## 2021-03-07 NOTE — Assessment & Plan Note (Signed)
Continue Crestor 

## 2021-03-07 NOTE — ED Provider Notes (Signed)
[facility] Provider Note    Event Date/Time   First MD Initiated Contact with Patient 03/07/21 1354     (approximate)   History   Chief Complaint Vascular Access Problem   HPI  Casey French is a 71 y.o. male with past medical history of hypertension, hyperlipidemia, diabetes, CAD, atrial fibrillation on Eliquis, and ESRD on PD who presents to the ED complaining of PD catheter problem.  Patient reports that last night his wife was attempting to perform a dressing change of his PD catheter site when she accidentally cut the catheter with a pair of scissors.  He states that he had leaking from the catheter was able to stop this with a clamp more proximally on the catheter.  He was unable to perform his usual peritoneal dialysis last night.  He denies any abdominal pain, fevers, nausea, or vomiting since then.  He spoke with his nephrologist, who recommended he come to the ED for further evaluation.     Physical Exam   Triage Vital Signs: ED Triage Vitals  Enc Vitals Group     BP 03/07/21 1334 136/75     Pulse Rate 03/07/21 1334 74     Resp 03/07/21 1334 16     Temp 03/07/21 1334 98 F (36.7 C)     Temp Source 03/07/21 1334 Oral     SpO2 03/07/21 1334 96 %     Weight --      Height --      Head Circumference --      Peak Flow --      Pain Score 03/07/21 1248 0     Pain Loc --      Pain Edu? --      Excl. in [place]? --     Most recent vital signs: Vitals:   03/07/21 1334  BP: 136/75  Pulse: 74  Resp: 16  Temp: 98 F (36.7 C)  SpO2: 96%    Constitutional: Alert and oriented. Eyes: Conjunctivae are normal. Head: Atraumatic. Nose: No congestion/rhinnorhea. Mouth/Throat: Mucous membranes are moist.  Cardiovascular: Normal rate, regular rhythm. Grossly normal heart sounds.  2+ radial pulses bilaterally. Respiratory: Normal respiratory effort.  No retractions. Lungs CTAB. Gastrointestinal: Soft and nontender. No distention.  PD catheter  site clean, dry, and intact.  Catheter with small nick through the entire wall of the catheter about 5 to 8 cm from entry site at his abdomen. Musculoskeletal: No lower extremity tenderness nor edema.  Neurologic:  Normal speech and language. No gross focal neurologic deficits are appreciated.    ED Results / Procedures / Treatments   Labs (all labs ordered are listed, but only abnormal results are displayed) Labs Reviewed  CBC WITH DIFFERENTIAL/PLATELET - Abnormal; Notable for the following components:      Result Value   RBC 2.76 (*)    Hemoglobin 8.7 (*)    HCT 27.1 (*)    All other components within normal limits  BASIC METABOLIC PANEL - Abnormal; Notable for the following components:   Glucose, Bld 125 (*)    BUN 35 (*)    Creatinine, Ser 4.46 (*)    Calcium 8.4 (*)    GFR, Estimated 14 (*)    All other components within normal limits  RESP PANEL BY RT-PCR (FLU A&B, COVID) [id number]     PROCEDURES:  Critical Care performed: No  Procedures   MEDICATIONS ORDERED IN ED: Medications  vancomycin (VANCOCIN) IVPB 1000 mg/200 mL premix (1,000 mg Intravenous  New Bag/Given 03/07/21 1453)  cefTRIAXone (ROCEPHIN) 1 g in sodium chloride 0.9 % 100 mL IVPB (0 g Intravenous Stopped 03/07/21 1458)     IMPRESSION / MDM / ASSESSMENT AND PLAN / ED COURSE  I reviewed the triage vital signs and the nursing notes.                              71 y.o. male with past medical history of hypertension, hyperlipidemia, diabetes, CAD, atrial fibrillation on Eliquis, and ESRD on PD who presents to the ED complaining of dysfunctional PD catheter after his wife accidentally cut the catheter while performing dressing change last night.  Differential diagnosis includes, but is not limited to, dysfunctional PD catheter, peritonitis, electrolyte abnormality.  Patient is nontoxic-appearing and in no acute distress, vital signs are reassuring and he has benign abdominal exam.  He does have cut through  the wall of the proximal portion of his PD catheter, is at risk for developing peritonitis but currently has no symptoms concerning for this.  Case discussed with Dr. Juleen China of nephrology, who recommends prophylactic antibiotics and we will screen labs.  Case also discussed with Dr. Lysle Pearl of general surgery, who will assess the catheter and determine if it will require replacement.  Dr. Lysle Pearl was able to repair catheter temporarily with Dermabond and tegaderm.  Nephrology recommends admission to ensure PD catheter functions appropriately until Eliquis washes out and catheter is able to be replaced in 3 days.  Labs are unremarkable as CBC shows no leukocytosis and BMP with no hyperkalemia or other electrolyte abnormality.  Case discussed with hospitalist for admission.      FINAL CLINICAL IMPRESSION(S) / ED DIAGNOSES   Final diagnoses:  Peritoneal dialysis catheter dysfunction, initial encounter [facility])     Rx / DC Orders   ED Discharge Orders     None        Note:  This document was prepared using Dragon voice recognition software and may include unintentional dictation errors.   Blake Divine, MD 03/07/21 1536

## 2021-03-07 NOTE — Assessment & Plan Note (Addendum)
Patient's wife accidentally nicked his peritoneal dialysis catheter last night and was leaking for which she was unable to dialyze.  No signs of infection near catheter site.   -- General surgery and nephrology consulted --PD catheter was patched with Dermabond, but had leaking when PD attempted 1st night.  --Empirically got Vanc and Rocephin in ED, continue these per nephrology --Follow peritoneal fluid culture - neg to date  --Dialysis per nephrology

## 2021-03-07 NOTE — Consult Note (Signed)
ANTICOAGULATION CONSULT NOTE - Initial Consult  Pharmacy Consult for heparin infusion Indication: atrial fibrillation - apixaban PTA   Allergies  Allergen Reactions   Morphine Anxiety    agitation     Patient Measurements: Height: 5\' 9"  (175.3 cm) Weight: 68 kg (150 lb) IBW/kg (Calculated) : 70.7 Heparin Dosing Weight: 68 kg  Vital Signs: Temp: 98 F (36.7 C) (02/11 1334) Temp Source: Oral (02/11 1334) BP: 136/75 (02/11 1334) Pulse Rate: 74 (02/11 1334)  Labs: Recent Labs    03/07/21 1426  HGB 8.7*  HCT 27.1*  PLT 162  CREATININE 4.46*    Estimated Creatinine Clearance: 15 mL/min (A) (by C-G formula based on SCr of 4.46 mg/dL (H)).   Medical History: Past Medical History:  Diagnosis Date   Acute diverticulitis 06/14/2014   Anemia    Anginal pain (HCC)    Aortic atherosclerosis (HCC)    Atrial fibrillation (HCC)    CKD (chronic kidney disease) stage 3, GFR 30-59 ml/min (HCC)    Coronary artery disease    Depression    Diabetes mellitus without complication (HCC)    Diverticulosis    GERD (gastroesophageal reflux disease)    GI bleed 05/15/2019   Gout    Heart murmur    Hematemesis 05/15/2019   High cholesterol    Hypertension    STEMI (ST elevation myocardial infarction) ([place]) 2003   Inferior    Medications:  Apixaban 2.5 mg PTA: last dose 03/07/21 AM  Assessment: 71 y.o. male with past medical history of atrial fibrillation on Eliquis, and ESRD on PD presented to ED 03/07/21 complaining of PD catheter malfunction. Surgery consulted for replacement. Pharmacy has been consulted to transition from apixaban to heparin infusion for possible procedure.  Baseline: aPTT 44, HL 0.74 (elevated based on prior apixaban exposure)  Goal of Therapy:  Heparin level 0.3-0.7 units/ml aPTT 66-102 seconds Monitor platelets by anticoagulation protocol: Yes   Plan:  Start heparin infusion at 1000 units/hr Check aPTT level in 8 hours. Will transition to heparin level  for monitoring once HL therapeutic and correlating with aPTT. Continue to monitor H&H and platelets  Dorothe Pea, PharmD, BCPS Clinical Pharmacist   03/07/2021,4:47 PM

## 2021-03-07 NOTE — Assessment & Plan Note (Addendum)
Patient appears to be in sinus rhythm at this time and rate controlled.  Home medication regimen includes Eliquis which she last took this morning. -Resume Eliquis (held for procedure) -stop heparin

## 2021-03-07 NOTE — Progress Notes (Signed)
Initiation of PD treatment, catheter intact, no fibrin seen in initial drain. Received call from primary nurse stating that patient reported that catheter is leaking, upon inspection, PD catheter is leaking from previously patched areas. On call Nephrologist notified, fluid sample collected, and treatment terminated. Patient will be seen on 03/09/21 for replacement. Patient in no distress, understands the plan.

## 2021-03-07 NOTE — ED Triage Notes (Signed)
Pt accidentally cut his PD catheter while changing the bandage today. Still intact and inside pt. Just has a hole and kidney MD sent pt here.

## 2021-03-07 NOTE — Consult Note (Signed)
Subjective:   CC: PD cath malfunction  HPI:  Casey French is a 71 y.o. male who was consulted by [facility] for evaluation of above.  Wife accidentally knicked the PD cath and was leaking afterwards during session.  He otherwise is asymptomatic.   Past Medical History:  has a past medical history of Acute diverticulitis (06/14/2014), Anemia, Anginal pain ([place]), Aortic atherosclerosis ([place]), Atrial fibrillation ([place]), CKD (chronic kidney disease) stage 3, GFR 30-59 ml/min ([place]), Coronary artery disease, Depression, Diabetes mellitus without complication ([place]), Diverticulosis, GERD (gastroesophageal reflux disease), GI bleed (05/15/2019), Gout, Heart murmur, Hematemesis (05/15/2019), High cholesterol, Hypertension, and STEMI (ST elevation myocardial infarction) ([place]) (2003).  Past Surgical History:  has a past surgical history that includes right hand surgery (2015); Colonoscopy with propofol (N/A, 04/01/2017); Cataract extraction w/PHACO (Right, 07/05/2019); Cardiac catheterization; Coronary angioplasty with stent (10/2001); Coronary angioplasty with stent (07/2015); Total hip arthroplasty (Left, 04/22/2020); and CAPD insertion (N/A, 08/13/2020).  Family History: family history includes CAD in an other family member; Heart disease in his father and mother.  Social History:  reports that he quit smoking about 19 years ago. His smoking use included cigarettes. He has never used smokeless tobacco. He reports that he does not drink alcohol and does not use drugs.  Current Medications:  Prior to Admission medications   Medication Sig Start Date End Date Taking? Authorizing Provider  allopurinol (ZYLOPRIM) 100 MG tablet Take 100 mg by mouth daily.    [provider]  amLODipine (NORVASC) 10 MG tablet Take 10 mg by mouth daily.    [provider]  apixaban (ELIQUIS) 2.5 MG TABS tablet Take 2.5 mg by mouth 2 (two) times daily.    [provider]  aspirin 81 MG EC tablet Take 81 mg  by mouth daily.    [provider]  calcitRIOL (ROCALTROL) 0.25 MCG capsule Take 0.25 mcg by mouth daily. 06/10/20 06/10/21  [provider]  fexofenadine (ALLEGRA) 180 MG tablet Take 180 mg by mouth daily as needed for allergies or rhinitis. Patient not taking: Reported on 12/16/2020    [provider]  fluticasone (FLONASE) 50 MCG/ACT nasal spray Place 2 sprays into both nostrils daily as needed for allergies. Patient not taking: Reported on 12/16/2020 04/30/14   [provider]  folic acid (FOLVITE) 1 MG tablet TAKE 2 TABLETS(2 MG) BY MOUTH DAILY 06/18/20   Sindy Guadeloupe, MD  gabapentin (NEURONTIN) 300 MG capsule Take 300 mg by mouth at bedtime. 09/25/16   [provider]  HYDROcodone-acetaminophen (NORCO/VICODIN) 5-325 MG tablet Take 1 tablet by mouth every 6 (six) hours as needed for moderate pain. Patient not taking: Reported on 12/16/2020 08/13/20   Ronny Bacon, MD  hydrocortisone (ANUSOL-HC) 2.5 % rectal cream Place 1 application rectally 2 (two) times daily as needed for hemorrhoids or anal itching.    [provider]  isosorbide mononitrate (IMDUR) 60 MG 24 hr tablet Take 60 mg by mouth daily.    [provider]  losartan (COZAAR) 100 MG tablet Take 100 mg by mouth daily. 04/28/20   [provider]  methocarbamol (ROBAXIN) 500 MG tablet Take 500 mg by mouth 3 (three) times daily as needed. 11/13/20   [provider]  metoprolol succinate (TOPROL-XL) 100 MG 24 hr tablet Take 150 mg by mouth daily. Take with or immediately following a meal.    [provider]  nitroGLYCERIN (NITROSTAT) 0.4 MG SL tablet Place 0.4 mg under the tongue every 5 (five) minutes as needed  for chest pain. Patient not taking: Reported on 12/16/2020    [provider]  omeprazole (PRILOSEC) 20 MG capsule Take 20 mg by mouth daily.    [provider]  psyllium (METAMUCIL) 58.6 % packet Take 1 packet by mouth 2 (two)  times daily.    [provider]  rosuvastatin (CRESTOR) 40 MG tablet Take 40 mg by mouth daily. 03/06/19   [provider]  torsemide (DEMADEX) 10 MG tablet Take 1 tablet (10 mg total) by mouth daily. Hold this medication until you see your nephrologist Patient not taking: Reported on 12/16/2020 02/08/20   Wyvonnia Dusky, MD  Vitamin D, Ergocalciferol, (DRISDOL) 1.25 MG (50000 UNIT) CAPS capsule Take 50,000 Units by mouth once a week. 10/23/20   [provider]    Allergies:  Allergies  Allergen Reactions   Morphine Anxiety    agitation     ROS:  General: Denies weight loss, weight gain, fatigue, fevers, chills, and night sweats. Eyes: Denies blurry vision, double vision, eye pain, itchy eyes, and tearing. Ears: Denies hearing loss, earache, and ringing in ears. Nose: Denies sinus pain, congestion, infections, runny nose, and nosebleeds. Mouth/throat: Denies hoarseness, sore throat, bleeding gums, and difficulty swallowing. Heart: Denies chest pain, palpitations, racing heart, irregular heartbeat, leg pain or swelling, and decreased activity tolerance. Respiratory: Denies breathing difficulty, shortness of breath, wheezing, cough, and sputum. GI: Denies change in appetite, heartburn, nausea, vomiting, constipation, diarrhea, and blood in stool. GU: Denies difficulty urinating, pain with urinating, urgency, frequency, blood in urine. Musculoskeletal: Denies joint stiffness, pain, swelling, muscle weakness. Skin: Denies rash, itching, mass, tumors, sores, and boils Neurologic: Denies headache, fainting, dizziness, seizures, numbness, and tingling. Psychiatric: Denies depression, anxiety, difficulty sleeping, and memory loss. Endocrine: Denies heat or cold intolerance, and increased thirst or urination. Blood/lymph: Denies easy bruising, easy bruising, and swollen glands     Objective:     BP 136/75 (BP Location: Left Arm)    Pulse 74    Temp 98 F (36.7  C) (Oral)    Resp 16    Ht 5\' 9"  (1.753 m)    Wt 68 kg    SpO2 96%    BMI 22.15 kg/m   Constitutional :  alert, cooperative, appears stated age, and no distress  Lymphatics/Throat:  no asymmetry, masses, or scars  Respiratory:  clear to auscultation bilaterally  Cardiovascular:  regularly irregular rhythm  Gastrointestinal: soft, non-tender; bowel sounds normal; no masses,  no organomegaly.  Musculoskeletal: Steady gait and movement  Skin: Cool and moist, PD cath in place with small knick.  Psychiatric: Normal affect, non-agitated, not confused       LABS:  CMP Latest Ref Rng & Units 12/16/2020 12/03/2020 12/02/2020  Glucose 70 - 99 mg/dL 92 100(H) 109(H)  BUN 8 - 23 mg/dL 34(H) 34(H) 40(H)  Creatinine 0.61 - 1.24 mg/dL 3.94(H) 4.75(H) 4.93(H)  Sodium 135 - 145 mmol/L 141 141 138  Potassium 3.5 - 5.1 mmol/L 4.5 3.3(L) 3.5  Chloride 98 - 111 mmol/L 106 108 106  CO2 22 - 32 mmol/L 27 24 21(L)  Calcium 8.9 - 10.3 mg/dL 8.2(L) 8.3(L) 8.6(L)  Total Protein 6.5 - 8.1 g/dL 5.8(L) - 6.3(L)  Total Bilirubin 0.3 - 1.2 mg/dL 0.2(L) - 0.6  Alkaline Phos 38 - 126 U/L 106 - 108  AST 15 - 41 U/L 14(L) - 36  ALT 0 - 44 U/L 11 - 17   CBC Latest Ref Rng & Units 12/16/2020 12/02/2020 11/25/2020  WBC  4.0 - 10.5 K/uL 6.3 6.9 -  Hemoglobin 13.0 - 17.0 g/dL 11.8(L) 12.7(L) 13.2  Hematocrit 39.0 - 52.0 % 37.0(L) 38.9(L) 41.2  Platelets 150 - 400 K/uL 173 238 -    RADS: N/a  Assessment:      PD Cath dysfunction Afib on eliquis  Plan:    Applied dermabond and tegaderm personally over the visible knick and seems to prevent any leakage from it after several manual flush and pull.  Will still recommend admission and monitoring to see able to complete session.  Will still schedule replacement once eliquis wears off to safely perform procedure.  Plan discussed with nephrology

## 2021-03-08 DIAGNOSIS — E875 Hyperkalemia: Secondary | ICD-10-CM | POA: Diagnosis not present

## 2021-03-08 DIAGNOSIS — T85611A Breakdown (mechanical) of intraperitoneal dialysis catheter, initial encounter: Secondary | ICD-10-CM | POA: Diagnosis not present

## 2021-03-08 LAB — CBC
HCT: 27.1 % — ABNORMAL LOW (ref 39.0–52.0)
Hemoglobin: 8.7 g/dL — ABNORMAL LOW (ref 13.0–17.0)
MCH: 30.4 pg (ref 26.0–34.0)
MCHC: 32.1 g/dL (ref 30.0–36.0)
MCV: 94.8 fL (ref 80.0–100.0)
Platelets: 149 10*3/uL — ABNORMAL LOW (ref 150–400)
RBC: 2.86 MIL/uL — ABNORMAL LOW (ref 4.22–5.81)
RDW: 14.6 % (ref 11.5–15.5)
WBC: 7.2 10*3/uL (ref 4.0–10.5)
nRBC: 0 % (ref 0.0–0.2)

## 2021-03-08 LAB — APTT
aPTT: 132 seconds — ABNORMAL HIGH (ref 24–36)
aPTT: 105 seconds — ABNORMAL HIGH (ref 24–36)

## 2021-03-08 LAB — RENAL FUNCTION PANEL
Albumin: 2.4 g/dL — ABNORMAL LOW (ref 3.5–5.0)
Anion gap: 4 — ABNORMAL LOW (ref 5–15)
BUN: 35 mg/dL — ABNORMAL HIGH (ref 8–23)
CO2: 24 mmol/L (ref 22–32)
Calcium: 8.3 mg/dL — ABNORMAL LOW (ref 8.9–10.3)
Chloride: 111 mmol/L (ref 98–111)
Creatinine, Ser: 4.47 mg/dL — ABNORMAL HIGH (ref 0.61–1.24)
GFR, Estimated: 14 mL/min — ABNORMAL LOW (ref >60)
Glucose, Bld: 90 mg/dL (ref 70–99)
Phosphorus: 4.5 mg/dL (ref 2.5–4.6)
Potassium: 5.2 mmol/L — ABNORMAL HIGH (ref 3.5–5.1)
Sodium: 139 mmol/L (ref 135–145)

## 2021-03-08 LAB — HEPARIN LEVEL (UNFRACTIONATED): Heparin Unfractionated: 0.76 IU/mL — ABNORMAL HIGH (ref 0.30–0.70)

## 2021-03-08 MED ORDER — HEPARIN (PORCINE) 25000 UT/250ML-% IV SOLN
1000.0000 [IU]/h | INTRAVENOUS | Status: DC
  Administered 2021-03-09: 03:00:00 750 [IU]/h via INTRAVENOUS
  Administered 2021-03-10: 1000 [IU]/h via INTRAVENOUS
  Filled 2021-03-08 (×2): qty 250

## 2021-03-08 MED ORDER — SODIUM ZIRCONIUM CYCLOSILICATE 10 G PO PACK
10.0000 g | PACK | Freq: Once | ORAL | Status: AC
  Administered 2021-03-08: 10 g via ORAL
  Filled 2021-03-08: qty 1

## 2021-03-08 NOTE — Progress Notes (Signed)
PD catheter not working and patient unable to receive PD tonight.  Confirmed with Colon Flattery, NP

## 2021-03-08 NOTE — Assessment & Plan Note (Signed)
On Eliquis for A-fib ?

## 2021-03-08 NOTE — Assessment & Plan Note (Addendum)
K 5.2 on 2/12, resolved with single dose Lokelma.  BMP in follow up.  Dialysis per nephrology.

## 2021-03-08 NOTE — Hospital Course (Signed)
Casey French is a 71 y.o. male with medical history significant of hypertension, hyperlipidemia, atrial fibrillation on Eliquis, CAD, DM type II,  ESRD on PD, and GI bleed who presents with complaints of peritoneal dialysis catheter leaking.  He was seen by general surgery and the leak was patched using Dermabond and Tegaderm.  Patient was admitted to attempt PD overnight, but the catheter again had leaking.    He will require the PD catheter likely to be exchanged. Nephrology is following for dialysis.

## 2021-03-08 NOTE — Progress Notes (Signed)
[facility]  ROUNDING NOTE   Subjective:   [name] was admitted to [facility] on 03/07/2021 for Peritoneal dialysis catheter dysfunction [facility]) [T85.611A] Peritoneal dialysis catheter dysfunction, initial encounter [facility]) [T85.611A]  Patient resting quietly  Alert and oriented Tolerating meals Denies abdominal pain and shortness of breath   Objective:  Vital signs in last 24 hours:  Temp:  [97.9 F (36.6 C)-98.1 F (36.7 C)] 98.1 F (36.7 C) (02/12 0801) Pulse Rate:  [72-84] 84 (02/12 0801) Resp:  [16-20] 18 (02/12 0801) BP: (136-166)/(75-88) 166/82 (02/12 0801) SpO2:  [95 %-99 %] 95 % (02/12 0801) Weight:  [68 kg] 68 kg (02/11 1430)  Weight change:  Filed Weights   03/07/21 1430  Weight: 68 kg    Intake/Output: I/O last 3 completed shifts: In: 3014.2 [P.O.:120; I.V.:94.2; Other:2500; IV Piggyback:300] Out: 2773 [Other:2773]   Intake/Output this shift:  Total I/O In: 480 [P.O.:480] Out: -   Physical Exam: General: NAD, resting in bed  Head: Normocephalic, atraumatic. Moist oral mucosal membranes  Eyes: Anicteric  Lungs:  Clear to auscultation, normal effort  Heart: Regular rate and rhythm  Abdomen:  Soft, nontender  Extremities:  no peripheral edema.  Neurologic: Nonfocal, moving all four extremities  Skin: No lesions  Access: PD catheter - exit site and tunnel wnl    Basic Metabolic Panel: Recent Labs  Lab 03/07/21 1426 03/08/21 0408  NA 139 139  K 4.0 5.2*  CL 109 111  CO2 23 24  GLUCOSE 125* 90  BUN 35* 35*  CREATININE 4.46* 4.47*  CALCIUM 8.4* 8.3*  PHOS  --  4.5     Liver Function Tests: Recent Labs  Lab 03/08/21 0408  ALBUMIN 2.4*   No results for input(s): LIPASE, AMYLASE in the last 168 hours. No results for input(s): AMMONIA in the last 168 hours.  CBC: Recent Labs  Lab 03/07/21 1426 03/08/21 0408  WBC 6.3 7.2  NEUTROABS 4.7  --   HGB 8.7* 8.7*  HCT 27.1* 27.1*  MCV 98.2 94.8  PLT 162 149*      Cardiac Enzymes: No results for input(s): CKTOTAL, CKMB, CKMBINDEX, TROPONINI in the last 168 hours.  BNP: Invalid input(s): POCBNP  CBG: No results for input(s): GLUCAP in the last 168 hours.  Microbiology: Results for orders placed or performed during the hospital encounter of 03/07/21  Resp Panel by RT-PCR (Flu A&B, Covid) Nasopharyngeal Swab     Status: None   Collection Time: 03/07/21  2:26 PM   Specimen: Nasopharyngeal Swab; Nasopharyngeal(NP) swabs in vial transport medium  Result Value Ref Range Status   SARS Coronavirus 2 by RT PCR NEGATIVE NEGATIVE Final    Comment: (NOTE) SARS-CoV-2 target nucleic acids are NOT DETECTED.  The SARS-CoV-2 RNA is generally detectable in upper respiratory specimens during the acute phase of infection. The lowest concentration of SARS-CoV-2 viral copies this assay can detect is 138 copies/mL. A negative result does not preclude SARS-Cov-2 infection and should not be used as the sole basis for treatment or other patient management decisions. A negative result may occur with  improper specimen collection/handling, submission of specimen other than nasopharyngeal swab, presence of viral mutation(s) within the areas targeted by this assay, and inadequate number of viral copies(<138 copies/mL). A negative result must be combined with clinical observations, patient history, and epidemiological information. The expected result is Negative.  Fact Sheet for Patients:  EntrepreneurPulse.com.au  Fact Sheet for Healthcare Providers:  IncredibleEmployment.be  This test is no t yet approved  or cleared by the [place] and  has been authorized for detection and/or diagnosis of SARS-CoV-2 by FDA under an Emergency Use Authorization (EUA). This EUA will remain  in effect (meaning this test can be used) for the duration of the COVID-19 declaration under Section 564(b)(1) of the Act, 21 U.S.C.section  360bbb-3(b)(1), unless the authorization is terminated  or revoked sooner.       Influenza A by PCR NEGATIVE NEGATIVE Final   Influenza B by PCR NEGATIVE NEGATIVE Final    Comment: (NOTE) The Xpert Xpress SARS-CoV-2/FLU/RSV plus assay is intended as an aid in the diagnosis of influenza from Nasopharyngeal swab specimens and should not be used as a sole basis for treatment. Nasal washings and aspirates are unacceptable for Xpert Xpress SARS-CoV-2/FLU/RSV testing.  Fact Sheet for Patients: EntrepreneurPulse.com.au  Fact Sheet for Healthcare Providers: IncredibleEmployment.be  This test is not yet approved or cleared by the [place] FDA and has been authorized for detection and/or diagnosis of SARS-CoV-2 by FDA under an Emergency Use Authorization (EUA). This EUA will remain in effect (meaning this test can be used) for the duration of the COVID-19 declaration under Section 564(b)(1) of the Act, 21 U.S.C. section 360bbb-3(b)(1), unless the authorization is terminated or revoked.  Performed at [facility], [address] [place],  [address]     Coagulation Studies: No results for input(s): LABPROT, INR in the last 72 hours.  Urinalysis: No results for input(s): COLORURINE, LABSPEC, PHURINE, GLUCOSEU, HGBUR, BILIRUBINUR, KETONESUR, PROTEINUR, UROBILINOGEN, NITRITE, LEUKOCYTESUR in the last 72 hours.  Invalid input(s): APPERANCEUR    Imaging: No results found.   Medications:    cefTRIAXone (ROCEPHIN)  IV     dialysis solution 1.5% low-MG/low-CA     heparin 800 Units/hr (03/08/21 6761)    allopurinol  100 mg Oral Daily   amLODipine  10 mg Oral Daily   calcitRIOL  0.25 mcg Oral Daily   gabapentin  300 mg Oral QHS   gentamicin cream  1 application Topical Daily   isosorbide mononitrate  60 mg Oral Daily   metoprolol succinate  150 mg Oral Daily   pantoprazole  40 mg Oral Daily   rosuvastatin  40 mg Oral  Daily   sodium chloride flush  3 mL Intravenous Q12H   sodium zirconium cyclosilicate  10 g Oral Once   acetaminophen **OR** acetaminophen, albuterol, methocarbamol  Assessment/ Plan:  [name] is a 71 y.o. White male with end stage renal disease on peritoneal dialysis, atrial fibrillation, coronary artery disease, diabetes mellitus type II, diverticulosis, gout, hyperlipidemia who presents to [facility] on 03/07/2021 for Peritoneal dialysis catheter dysfunction [facility]) [P50.932I] Peritoneal dialysis catheter dysfunction, initial encounter [facility]) [T85.611A]  [facility] 67kg CCPD 9 hours 4 exchanges 255mL fills   End stage renal disease on peritoneal dialysis: now with complication of dialysis device. Last peritoneal dialysis was last night. Will prepare for peritoneal dialysis tonight.  - Surgery attempted to repair catheter, but continued to leak at initiation of treatment. Will hold treatment tonight -Surgery consulted and plan to repair or replace tomorrow. Will make patient NPO past midnight tonight - Continue empiric ceftriaxone 1g daily and vancomycin 1g three times a week.  - Send cell count and diff normal. Culture pending  Hypertension: Home regimen of amlodipine, losartan, isosorbide mononitrate, metoprolol and torsemide.  BP 166/82  Anemia of chronic kidney disease: hemoglobin remains 8.7. Mircera as outpatient.   Secondary Hyperparathyroidism: outpatient PTH, calcium and phos at goal.  LOS: 0   2/12/202310:11 AM

## 2021-03-08 NOTE — Consult Note (Signed)
ANTICOAGULATION CONSULT NOTE  Pharmacy Consult for heparin infusion Indication: atrial fibrillation - apixaban PTA   Allergies  Allergen Reactions   Morphine Anxiety    agitation     Patient Measurements: Height: 5\' 9"  (175.3 cm) Weight: 68 kg (150 lb) IBW/kg (Calculated) : 70.7 Heparin Dosing Weight: 68 kg  Vital Signs: Temp: 98.1 F (36.7 C) (02/12 0801) Temp Source: Oral (02/12 0801) BP: 166/82 (02/12 0801) Pulse Rate: 84 (02/12 0801)  Labs: Recent Labs    03/07/21 1426 03/07/21 1713 03/08/21 0408 03/08/21 1421  HGB 8.7*  --  8.7*  --   HCT 27.1*  --  27.1*  --   PLT 162  --  149*  --   APTT  --  44* 132* 105*  HEPARINUNFRC  --  0.78* 0.76*  --   CREATININE 4.46*  --  4.47*  --      Estimated Creatinine Clearance: 15 mL/min (A) (by C-G formula based on SCr of 4.47 mg/dL (H)).   Medical History: Past Medical History:  Diagnosis Date   Acute diverticulitis 06/14/2014   Anemia    Anginal pain (HCC)    Aortic atherosclerosis (HCC)    Atrial fibrillation (HCC)    CKD (chronic kidney disease) stage 3, GFR 30-59 ml/min (HCC)    Coronary artery disease    Depression    Diabetes mellitus without complication (HCC)    Diverticulosis    GERD (gastroesophageal reflux disease)    GI bleed 05/15/2019   Gout    Heart murmur    Hematemesis 05/15/2019   High cholesterol    Hypertension    STEMI (ST elevation myocardial infarction) ([place]) 2003   Inferior    Medications:  Apixaban 2.5 mg PTA: last dose 03/07/21 AM  Assessment: 71 y.o. male with past medical history of atrial fibrillation on Eliquis, and ESRD on PD presented to ED 03/07/21 complaining of PD catheter malfunction. Surgery consulted for replacement. Pharmacy has been consulted to transition from apixaban to heparin infusion for possible procedure.  Baseline: aPTT 44, HL 0.74 (elevated based on prior apixaban exposure)  Date Time aPTT/HL Rate/comment  2/12  0408 132/0.76 1000 un/hr,  supratherapeutic 2/12 1421 105  800 un/hr, supratherapeutic  Goal of Therapy:  Heparin level 0.3-0.7 units/ml aPTT 66-102 seconds Monitor platelets by anticoagulation protocol: Yes   Plan:  Decrease heparin infusion rate to 750 units/hr Recheck aPTT level in 8 hr after rate change. Will transition to heparin level for monitoring once HL therapeutic and correlating with aPTT. HL and CBC daily while on heparin.  Darnelle Bos, PharmD 03/08/2021 3:01 PM

## 2021-03-08 NOTE — Progress Notes (Addendum)
Progress Note   Patient: Casey French PIR:[id number] DOB: 09/12/1950 DOA: 03/07/2021     0 DOS: the patient was seen and examined on 03/08/2021   Brief hospital course: DEMARION PONDEXTER is a 71 y.o. male with medical history significant of hypertension, hyperlipidemia, atrial fibrillation on Eliquis, CAD, DM type II,  ESRD on PD, and GI bleed who presents with complaints of peritoneal dialysis catheter leaking.  He was seen by general surgery and the leak was patched using Dermabond and Tegaderm.  Patient was admitted to attempt PD overnight, but the catheter again had leaking.    He will require the PD catheter likely to be exchanged. Nephrology is following for dialysis.  Assessment and Plan: * Peritoneal dialysis catheter dysfunction ([place])  ESRD on peritoneal dialysis- (present on admission) Patient's wife accidentally nicked his peritoneal dialysis catheter last night and was leaking for which she was unable to dialyze.  No signs of infection near catheter site.   -- General surgery and nephrology consulted --PD catheter was patched with Dermabond, but had leaking when PD attempted last night.  --Empirically got Vanc and Rocephin in ED. --Follow peritoneal fluid culture. --Continue empiric Rocephin IV   --Dialysis per nephrology  Hyperkalemia K 5.2 this AM, mildly elevated. Single dose Lokelma today and recheck BMP in AM. Dialysis when able.  PAF (paroxysmal atrial fibrillation) (HCC) on chronic anticoagulation- (present on admission) Patient appears to be in sinus rhythm at this time and rate controlled.  Home medication regimen includes Eliquis which she last took this morning. -Hold Eliquis in case of need of procedure -Heparin per pharmacy   ESRD on peritoneal dialysis [facility]) Patient had recurrent leaking of PD catheter last night with PD was tried. Nephrology following. Monitor volume status and electrolytes.  Anemia in chronic kidney disease- (present on  admission) Acute on chronic.  On admission hemoglobin 8.7 g/dL, but previously had been 11.8 back in 11/2020.  Patient has prior history of GI bleed, but denies any reports of bleeding or black/dark stools. -Recheck CBC tomorrow morning  Chronic anticoagulation On Eliquis for A-fib  DM type 2 (diabetes mellitus, type 2) ([place]) Diet controlled.  Currently not on any medication for treatment.  Hyperlipidemia- (present on admission) - Continue Crestor  Benign essential HTN- (present on admission) Home medication regimen includes amlodipine 10 mg daily, isosorbide mononitrate 60 mg daily, losartan 100 mg daily, and metoprolol 150 mg daily.        Subjective: Pt sleeping soundly when seen this AM.  Wakes briefly and reports very tired, little sleep overnight.  No acute complaints including any abdominal pain.  Physical Exam: Vitals:   03/07/21 1430 03/07/21 1927 03/08/21 0504 03/08/21 0801  BP:  (!) 146/81 (!) 144/88 (!) 166/82  Pulse:  72 81 84  Resp:  20 16 18   Temp:  98 F (36.7 C) 97.9 F (36.6 C) 98.1 F (36.7 C)  TempSrc:  Oral Oral Oral  SpO2:  99% 97% 95%  Weight: 68 kg     Height: 5\' 9"  (1.753 m)      General exam: awake, alert, no acute distress Respiratory system: CTAB, no wheezes, rales or rhonchi, normal respiratory effort. Cardiovascular system: normal S1/S2, RRR Gastrointestinal system: soft, PD catheter in place Central nervous system: no gross focal neurologic deficits, normal speech Skin: dry, intact, normal temperature Psychiatry: normal mood, congruent affect, judgement and insight appear normal   Data Reviewed:  Labs reviewed, notable for K 5.2, BUN 35, Cr 4.47, Albumin 2.4, Ca  8.3, Hbg stable 8.7, Plt 149k, peritonal fluid culture no growth to date  Family Communication: none at bedside, will attempt to call wife  Disposition: Status is: Observation The patient remains OBS appropriate and will d/c before 2 midnights.        Planned  Discharge Destination: Home     Time spent: 35 minutes  Author: Ezekiel Slocumb, DO 03/08/2021 12:36 PM  For on call review [email or web address].

## 2021-03-08 NOTE — Assessment & Plan Note (Addendum)
Patient had recurrent leaking of PD catheter last night with PD first night of admission Nephrology following. General surgery consult to address PD cath. Monitor volume status and electrolytes.

## 2021-03-08 NOTE — Consult Note (Signed)
ANTICOAGULATION CONSULT NOTE  Pharmacy Consult for heparin infusion Indication: atrial fibrillation - apixaban PTA   Allergies  Allergen Reactions   Morphine Anxiety    agitation     Patient Measurements: Height: 5\' 9"  (175.3 cm) Weight: 68 kg (150 lb) IBW/kg (Calculated) : 70.7 Heparin Dosing Weight: 68 kg  Vital Signs: Temp: 97.9 F (36.6 C) (02/12 0504) Temp Source: Oral (02/12 0504) BP: 144/88 (02/12 0504) Pulse Rate: 81 (02/12 0504)  Labs: Recent Labs    03/07/21 1426 03/07/21 1713 03/08/21 0408  HGB 8.7*  --  8.7*  HCT 27.1*  --  27.1*  PLT 162  --  149*  APTT  --  44* 132*  HEPARINUNFRC  --  0.78* 0.76*  CREATININE 4.46*  --  4.47*     Estimated Creatinine Clearance: 15 mL/min (A) (by C-G formula based on SCr of 4.47 mg/dL (H)).   Medical History: Past Medical History:  Diagnosis Date   Acute diverticulitis 06/14/2014   Anemia    Anginal pain (HCC)    Aortic atherosclerosis (HCC)    Atrial fibrillation (HCC)    CKD (chronic kidney disease) stage 3, GFR 30-59 ml/min (HCC)    Coronary artery disease    Depression    Diabetes mellitus without complication (HCC)    Diverticulosis    GERD (gastroesophageal reflux disease)    GI bleed 05/15/2019   Gout    Heart murmur    Hematemesis 05/15/2019   High cholesterol    Hypertension    STEMI (ST elevation myocardial infarction) ([place]) 2003   Inferior    Medications:  Apixaban 2.5 mg PTA: last dose 03/07/21 AM  Assessment: 71 y.o. male with past medical history of atrial fibrillation on Eliquis, and ESRD on PD presented to ED 03/07/21 complaining of PD catheter malfunction. Surgery consulted for replacement. Pharmacy has been consulted to transition from apixaban to heparin infusion for possible procedure.  Baseline: aPTT 44, HL 0.74 (elevated based on prior apixaban exposure)  Goal of Therapy:  Heparin level 0.3-0.7 units/ml aPTT 66-102 seconds Monitor platelets by anticoagulation protocol:  Yes  2/12 0408 aPTT 132, supratherapeutic, HL 0.76   Plan:  Decrease heparin infusion rate to 800 units/hr Recheck aPTT level in 8 hr after rate change. Will transition to heparin level for monitoring once HL therapeutic and correlating with aPTT. HL and CBC daily while on heparin.  Renda Rolls, PharmD, [facility] 03/08/2021 6:17 AM

## 2021-03-09 ENCOUNTER — Telehealth: Payer: Self-pay | Admitting: *Deleted

## 2021-03-09 ENCOUNTER — Encounter
Admission: EM | Disposition: A | Discharge status: Home / Self Care | Payer: Self-pay | Source: Home / Self Care | Attending: Internal Medicine

## 2021-03-09 ENCOUNTER — Encounter: Payer: Self-pay | Admitting: Registered Nurse

## 2021-03-09 DIAGNOSIS — N186 End stage renal disease: Secondary | ICD-10-CM | POA: Diagnosis not present

## 2021-03-09 DIAGNOSIS — E1122 Type 2 diabetes mellitus with diabetic chronic kidney disease: Secondary | ICD-10-CM | POA: Diagnosis not present

## 2021-03-09 DIAGNOSIS — I120 Hypertensive chronic kidney disease with stage 5 chronic kidney disease or end stage renal disease: Secondary | ICD-10-CM | POA: Diagnosis not present

## 2021-03-09 DIAGNOSIS — N2581 Secondary hyperparathyroidism of renal origin: Secondary | ICD-10-CM | POA: Diagnosis not present

## 2021-03-09 DIAGNOSIS — Y812 Prosthetic and other implants, materials and accessory general- and plastic-surgery devices associated with adverse incidents: Secondary | ICD-10-CM | POA: Diagnosis present

## 2021-03-09 DIAGNOSIS — Z7982 Long term (current) use of aspirin: Secondary | ICD-10-CM | POA: Diagnosis not present

## 2021-03-09 DIAGNOSIS — Z20822 Contact with and (suspected) exposure to covid-19: Secondary | ICD-10-CM | POA: Diagnosis not present

## 2021-03-09 DIAGNOSIS — I252 Old myocardial infarction: Secondary | ICD-10-CM | POA: Diagnosis not present

## 2021-03-09 DIAGNOSIS — T85631A Leakage of intraperitoneal dialysis catheter, initial encounter: Secondary | ICD-10-CM | POA: Diagnosis not present

## 2021-03-09 DIAGNOSIS — Z992 Dependence on renal dialysis: Secondary | ICD-10-CM | POA: Diagnosis not present

## 2021-03-09 DIAGNOSIS — Z7901 Long term (current) use of anticoagulants: Secondary | ICD-10-CM | POA: Diagnosis not present

## 2021-03-09 DIAGNOSIS — Z885 Allergy status to narcotic agent status: Secondary | ICD-10-CM | POA: Diagnosis not present

## 2021-03-09 DIAGNOSIS — I700 Atherosclerosis of aorta: Secondary | ICD-10-CM | POA: Diagnosis not present

## 2021-03-09 DIAGNOSIS — D631 Anemia in chronic kidney disease: Secondary | ICD-10-CM | POA: Diagnosis not present

## 2021-03-09 DIAGNOSIS — E875 Hyperkalemia: Secondary | ICD-10-CM | POA: Diagnosis not present

## 2021-03-09 DIAGNOSIS — Z96642 Presence of left artificial hip joint: Secondary | ICD-10-CM | POA: Diagnosis not present

## 2021-03-09 DIAGNOSIS — K219 Gastro-esophageal reflux disease without esophagitis: Secondary | ICD-10-CM | POA: Diagnosis not present

## 2021-03-09 DIAGNOSIS — I2510 Atherosclerotic heart disease of native coronary artery without angina pectoris: Secondary | ICD-10-CM | POA: Diagnosis not present

## 2021-03-09 DIAGNOSIS — K5730 Diverticulosis of large intestine without perforation or abscess without bleeding: Secondary | ICD-10-CM | POA: Diagnosis not present

## 2021-03-09 DIAGNOSIS — Z79899 Other long term (current) drug therapy: Secondary | ICD-10-CM | POA: Diagnosis not present

## 2021-03-09 DIAGNOSIS — T85611A Breakdown (mechanical) of intraperitoneal dialysis catheter, initial encounter: Secondary | ICD-10-CM | POA: Diagnosis present

## 2021-03-09 DIAGNOSIS — E7800 Pure hypercholesterolemia, unspecified: Secondary | ICD-10-CM | POA: Diagnosis not present

## 2021-03-09 DIAGNOSIS — I480 Paroxysmal atrial fibrillation: Secondary | ICD-10-CM | POA: Diagnosis not present

## 2021-03-09 DIAGNOSIS — Z87891 Personal history of nicotine dependence: Secondary | ICD-10-CM | POA: Diagnosis not present

## 2021-03-09 DIAGNOSIS — Z8249 Family history of ischemic heart disease and other diseases of the circulatory system: Secondary | ICD-10-CM | POA: Diagnosis not present

## 2021-03-09 LAB — CBC
HCT: 28.6 % — ABNORMAL LOW (ref 39.0–52.0)
Hemoglobin: 9.4 g/dL — ABNORMAL LOW (ref 13.0–17.0)
MCH: 31.3 pg (ref 26.0–34.0)
MCHC: 32.9 g/dL (ref 30.0–36.0)
MCV: 95.3 fL (ref 80.0–100.0)
Platelets: 157 10*3/uL (ref 150–400)
RBC: 3 MIL/uL — ABNORMAL LOW (ref 4.22–5.81)
RDW: 14.3 % (ref 11.5–15.5)
WBC: 6 10*3/uL (ref 4.0–10.5)
nRBC: 0 % (ref 0.0–0.2)

## 2021-03-09 LAB — HEPARIN LEVEL (UNFRACTIONATED)
Heparin Unfractionated: 0.32 IU/mL (ref 0.30–0.70)
Heparin Unfractionated: 0.33 IU/mL (ref 0.30–0.70)

## 2021-03-09 LAB — PATHOLOGIST SMEAR REVIEW

## 2021-03-09 LAB — BASIC METABOLIC PANEL
Anion gap: 7 (ref 5–15)
BUN: 38 mg/dL — ABNORMAL HIGH (ref 8–23)
CO2: 20 mmol/L — ABNORMAL LOW (ref 22–32)
Calcium: 8.5 mg/dL — ABNORMAL LOW (ref 8.9–10.3)
Chloride: 112 mmol/L — ABNORMAL HIGH (ref 98–111)
Creatinine, Ser: 5.11 mg/dL — ABNORMAL HIGH (ref 0.61–1.24)
GFR, Estimated: 12 mL/min — ABNORMAL LOW (ref >60)
Glucose, Bld: 93 mg/dL (ref 70–99)
Potassium: 4.2 mmol/L (ref 3.5–5.1)
Sodium: 139 mmol/L (ref 135–145)

## 2021-03-09 LAB — APTT
aPTT: 69 seconds — ABNORMAL HIGH (ref 24–36)
aPTT: 74 seconds — ABNORMAL HIGH (ref 24–36)

## 2021-03-09 SURGERY — LAPAROSCOPIC REVISION CONTINUOUS AMBULATORY PERITONEAL DIALYSIS  (CAPD) CATHETER
Anesthesia: General

## 2021-03-09 MED ORDER — SODIUM CHLORIDE 0.9 % IV SOLN
2.0000 g | INTRAVENOUS | Status: DC
  Filled 2021-03-09: qty 20

## 2021-03-09 MED ORDER — SODIUM CHLORIDE 0.9 % IV SOLN
1.0000 g | INTRAVENOUS | Status: DC
  Administered 2021-03-09: 1 g via INTRAVENOUS
  Filled 2021-03-09: qty 10
  Filled 2021-03-09: qty 1

## 2021-03-09 MED ORDER — VANCOMYCIN HCL IN DEXTROSE 1-5 GM/200ML-% IV SOLN
1000.0000 mg | INTRAVENOUS | Status: DC
  Administered 2021-03-09: 1000 mg via INTRAVENOUS
  Filled 2021-03-09: qty 200

## 2021-03-09 SURGICAL SUPPLY — 39 items
ADAPTER CATH DIALYSIS 4X8 IT L (MISCELLANEOUS) ×2 IMPLANT
BIOPATCH WHT 1IN DISK W/4.0 H (GAUZE/BANDAGES/DRESSINGS) ×2 IMPLANT
BLADE CLIPPER SURG (BLADE) ×2 IMPLANT
CATH EXTENDED DIALYSIS (CATHETERS) ×2 IMPLANT
DERMABOND ADVANCED (GAUZE/BANDAGES/DRESSINGS) ×1
DERMABOND ADVANCED .7 DNX12 (GAUZE/BANDAGES/DRESSINGS) ×1 IMPLANT
ELECT REM PT RETURN 9FT ADLT (ELECTROSURGICAL) ×2
ELECTRODE REM PT RTRN 9FT ADLT (ELECTROSURGICAL) ×1 IMPLANT
GLOVE SURG ORTHO LTX SZ7.5 (GLOVE) ×2 IMPLANT
GOWN STRL REUS W/ TWL LRG LVL3 (GOWN DISPOSABLE) ×2 IMPLANT
GOWN STRL REUS W/TWL LRG LVL3 (GOWN DISPOSABLE) ×4
GRASPER SUT TROCAR 14GX15 (MISCELLANEOUS) IMPLANT
IV NS 1000ML (IV SOLUTION) ×2
IV NS 1000ML BAXH (IV SOLUTION) ×1 IMPLANT
KIT TURNOVER KIT A (KITS) ×2 IMPLANT
MANIFOLD NEPTUNE II (INSTRUMENTS) ×2 IMPLANT
MINICAP W/POVIDONE IODINE SOL (MISCELLANEOUS) ×2 IMPLANT
NEEDLE HYPO 22GX1.5 SAFETY (NEEDLE) ×2 IMPLANT
NEEDLE INSUFFLATION 14GA 120MM (NEEDLE) IMPLANT
NS IRRIG 500ML POUR BTL (IV SOLUTION) ×2 IMPLANT
PACK LAP CHOLECYSTECTOMY (MISCELLANEOUS) ×2 IMPLANT
SET CYSTO W/LG BORE CLAMP LF (SET/KITS/TRAYS/PACK) ×2 IMPLANT
SET TRANSFER 6 W/TWIST CLAMP 5 (SET/KITS/TRAYS/PACK) ×2 IMPLANT
SET TUBE SMOKE EVAC HIGH FLOW (TUBING) ×2 IMPLANT
SPONGE DRAIN TRACH 4X4 STRL 2S (GAUZE/BANDAGES/DRESSINGS) ×2 IMPLANT
STYLET FALLER (MISCELLANEOUS) IMPLANT
STYLET FALLER MEDIONICS (MISCELLANEOUS) IMPLANT
SUT MNCRL 4-0 (SUTURE) ×2
SUT MNCRL 4-0 27XMFL (SUTURE) ×1
SUT PROLENE 0 CT 2 (SUTURE) ×2 IMPLANT
SUT VIC AB 3-0 SH 27 (SUTURE) ×2
SUT VIC AB 3-0 SH 27X BRD (SUTURE) ×1 IMPLANT
SUT VICRYL 0 AB UR-6 (SUTURE) IMPLANT
SUT VICRYL 0 TIES 12 18 (SUTURE) ×2 IMPLANT
SUTURE MNCRL 4-0 27XMF (SUTURE) ×1 IMPLANT
SYS KII FIOS ACCESS ABD 5X100 (TROCAR) ×2
SYSTEM KII FIOS ACES ABD 5X100 (TROCAR) ×1 IMPLANT
TROCAR Z-THREAD OPTICAL 5X100M (TROCAR) ×4 IMPLANT
WATER STERILE IRR 500ML POUR (IV SOLUTION) ×2 IMPLANT

## 2021-03-09 NOTE — Progress Notes (Signed)
[facility]  ROUNDING NOTE   Subjective:   [name] was admitted to [facility] on 03/07/2021 for Peritoneal dialysis catheter dysfunction [facility]) [U93.235T] Peritoneal dialysis catheter dysfunction, initial encounter ([place]) [T85.611A]  Resting quietly Currently NPO for procedure Denies pain and discomfort Denies shortness of breath   Objective:  Vital signs in last 24 hours:  Temp:  [98.1 F (36.7 C)-98.4 F (36.9 C)] 98.1 F (36.7 C) (02/13 0809) Pulse Rate:  [71-82] 74 (02/13 0809) Resp:  [16-18] 18 (02/13 0809) BP: (126-141)/(76-89) 141/89 (02/13 0809) SpO2:  [96 %-97 %] 97 % (02/13 0809) Weight:  [65.3 kg] 65.3 kg (02/13 0419)  Weight change: -2.739 kg Filed Weights   03/07/21 1430 03/09/21 0419  Weight: 68 kg 65.3 kg    Intake/Output: I/O last 3 completed shifts: In: 3853.7 [P.O.:1080; I.V.:273.7; Other:2500] Out: 2773 [Other:2773]   Intake/Output this shift:  Total I/O In: 26.9 [I.V.:26.9] Out: -   Physical Exam: General: NAD, resting in bed  Head: Normocephalic, atraumatic. Moist oral mucosal membranes  Eyes: Anicteric  Lungs:  Clear to auscultation, normal effort  Heart: Regular rate and rhythm  Abdomen:  Soft, nontender  Extremities:  no peripheral edema.  Neurologic: Nonfocal, moving all four extremities  Skin: No lesions  Access: PD catheter - exit site and tunnel wnl    Basic Metabolic Panel: Recent Labs  Lab 03/07/21 1426 03/08/21 0408 03/09/21 0839  NA 139 139 139  K 4.0 5.2* 4.2  CL 109 111 112*  CO2 23 24 20*  GLUCOSE 125* 90 93  BUN 35* 35* 38*  CREATININE 4.46* 4.47* 5.11*  CALCIUM 8.4* 8.3* 8.5*  PHOS  --  4.5  --      Liver Function Tests: Recent Labs  Lab 03/08/21 0408  ALBUMIN 2.4*    No results for input(s): LIPASE, AMYLASE in the last 168 hours. No results for input(s): AMMONIA in the last 168 hours.  CBC: Recent Labs  Lab 03/07/21 1426 03/08/21 0408 03/09/21 0839  WBC 6.3 7.2 6.0   NEUTROABS 4.7  --   --   HGB 8.7* 8.7* 9.4*  HCT 27.1* 27.1* 28.6*  MCV 98.2 94.8 95.3  PLT 162 149* 157     Cardiac Enzymes: No results for input(s): CKTOTAL, CKMB, CKMBINDEX, TROPONINI in the last 168 hours.  BNP: Invalid input(s): POCBNP  CBG: No results for input(s): GLUCAP in the last 168 hours.  Microbiology: Results for orders placed or performed during the hospital encounter of 03/07/21  Resp Panel by RT-PCR (Flu A&B, Covid) Nasopharyngeal Swab     Status: None   Collection Time: 03/07/21  2:26 PM   Specimen: Nasopharyngeal Swab; Nasopharyngeal(NP) swabs in vial transport medium  Result Value Ref Range Status   SARS Coronavirus 2 by RT PCR NEGATIVE NEGATIVE Final    Comment: (NOTE) SARS-CoV-2 target nucleic acids are NOT DETECTED.  The SARS-CoV-2 RNA is generally detectable in upper respiratory specimens during the acute phase of infection. The lowest concentration of SARS-CoV-2 viral copies this assay can detect is 138 copies/mL. A negative result does not preclude SARS-Cov-2 infection and should not be used as the sole basis for treatment or other patient management decisions. A negative result may occur with  improper specimen collection/handling, submission of specimen other than nasopharyngeal swab, presence of viral mutation(s) within the areas targeted by this assay, and inadequate number of viral copies(<138 copies/mL). A negative result must be combined with clinical observations, patient history, and epidemiological information. The expected result  is Negative.  Fact Sheet for Patients:  EntrepreneurPulse.com.au  Fact Sheet for Healthcare Providers:  IncredibleEmployment.be  This test is no t yet approved or cleared by the [place] FDA and  has been authorized for detection and/or diagnosis of SARS-CoV-2 by FDA under an Emergency Use Authorization (EUA). This EUA will remain  in effect (meaning this test can  be used) for the duration of the COVID-19 declaration under Section 564(b)(1) of the Act, 21 U.S.C.section 360bbb-3(b)(1), unless the authorization is terminated  or revoked sooner.       Influenza A by PCR NEGATIVE NEGATIVE Final   Influenza B by PCR NEGATIVE NEGATIVE Final    Comment: (NOTE) The Xpert Xpress SARS-CoV-2/FLU/RSV plus assay is intended as an aid in the diagnosis of influenza from Nasopharyngeal swab specimens and should not be used as a sole basis for treatment. Nasal washings and aspirates are unacceptable for Xpert Xpress SARS-CoV-2/FLU/RSV testing.  Fact Sheet for Patients: EntrepreneurPulse.com.au  Fact Sheet for Healthcare Providers: IncredibleEmployment.be  This test is not yet approved or cleared by the [place] FDA and has been authorized for detection and/or diagnosis of SARS-CoV-2 by FDA under an Emergency Use Authorization (EUA). This EUA will remain in effect (meaning this test can be used) for the duration of the COVID-19 declaration under Section 564(b)(1) of the Act, 21 U.S.C. section 360bbb-3(b)(1), unless the authorization is terminated or revoked.  Performed at [facility], [address] [place], [place] [address]   Body fluid culture w Gram Stain     Status: None (Preliminary result)   Collection Time: 03/07/21  9:30 PM   Specimen: Peritoneal Washings; Body Fluid  Result Value Ref Range Status   Specimen Description   Final    PERITONEAL Performed at [facility], [address] [place], [place] [address]    Special Requests   Final    NONE Performed at [facility], [place], [place] [address]    Gram Stain NO WBC SEEN NO ORGANISMS SEEN   Final   Culture   Final    NO GROWTH < 24 HOURS Performed at [facility], [address] [place], [place] [address]    Report Status PENDING  Incomplete    Coagulation Studies: No results for  input(s): LABPROT, INR in the last 72 hours.  Urinalysis: No results for input(s): COLORURINE, LABSPEC, PHURINE, GLUCOSEU, HGBUR, BILIRUBINUR, KETONESUR, PROTEINUR, UROBILINOGEN, NITRITE, LEUKOCYTESUR in the last 72 hours.  Invalid input(s): APPERANCEUR    Imaging: No results found.   Medications:    dialysis solution 1.5% low-MG/low-CA     heparin 750 Units/hr (03/09/21 0649)    allopurinol  100 mg Oral Daily   amLODipine  10 mg Oral Daily   calcitRIOL  0.25 mcg Oral Daily   gabapentin  300 mg Oral QHS   gentamicin cream  1 application Topical Daily   isosorbide mononitrate  60 mg Oral Daily   metoprolol succinate  150 mg Oral Daily   pantoprazole  40 mg Oral Daily   rosuvastatin  40 mg Oral Daily   sodium chloride flush  3 mL Intravenous Q12H   acetaminophen **OR** acetaminophen, albuterol, methocarbamol  Assessment/ Plan:  [name] is a 71 y.o. White male with end stage renal disease on peritoneal dialysis, atrial fibrillation, coronary artery disease, diabetes mellitus type II, diverticulosis, gout, hyperlipidemia who presents to [facility] on 03/07/2021 for Peritoneal dialysis catheter dysfunction ([place]) [Z85.885O] Peritoneal dialysis catheter dysfunction, initial  encounter [facility]) [T85.611A]  [facility] 67kg CCPD 9 hours 4 exchanges 2559mL fills   End stage renal disease on peritoneal dialysis: now with complication of dialysis device. Last peritoneal dialysis was last night. Will prepare for peritoneal dialysis tonight.  -Plan for surgery to exchange [place] today.  - Continue empiric ceftriaxone 1g daily and vancomycin 1g three times a week.  - Cell count and diff normal. Culture shows no growth at this time, still evaluating.   Hypertension: Home regimen of amlodipine, losartan, isosorbide mononitrate, metoprolol and torsemide.  BP 141/89, stable for this patient   Anemia of chronic kidney disease: hemoglobin 9.4 [place]a as outpatient.   Secondary  Hyperparathyroidism: outpatient PTH, calcium and phos at goal.  Continue calcitriol.    LOS: 0   2/13/202311:56 AM

## 2021-03-09 NOTE — Telephone Encounter (Signed)
Casey French called me about if I knew the pt was in the hospital and it was because peritoneal tube that he has for dialysis is not working and they are trying to do a procedure that may help it and if not they will take him to surgery for a new tube for him. We will need to cancel the scan and the fluids around the scan. We will make a new appt but his dialysis is a priority for now. Wife agrees and we will get in touch with her when we get new appt and make sure he is good with dialysis is back on tract

## 2021-03-09 NOTE — Progress Notes (Signed)
Progress Note   Patient: Casey French DOB: 1951/01/23 DOA: 03/07/2021     0 DOS: the patient was seen and examined on 03/09/2021   Brief hospital course: Casey French is a 71 y.o. male with medical history significant of hypertension, hyperlipidemia, atrial fibrillation on Eliquis, CAD, DM type II,  ESRD on PD, and GI bleed who presents with complaints of peritoneal dialysis catheter leaking.  He was seen by general surgery and the leak was patched using Dermabond and Tegaderm.  Patient was admitted to attempt PD overnight, but the catheter again had leaking.    He will require the PD catheter likely to be exchanged. Nephrology is following for dialysis.  Assessment and Plan: * Peritoneal dialysis catheter dysfunction ([place])  ESRD on peritoneal dialysis- (present on admission) Patient's wife accidentally nicked his peritoneal dialysis catheter last night and was leaking for which she was unable to dialyze.  No signs of infection near catheter site.   -- General surgery and nephrology consulted --PD catheter was patched with Dermabond, but had leaking when PD attempted 1st night.  --Empirically got Vanc and Rocephin in ED, continue these per nephrology --Follow peritoneal fluid culture - neg to date  --Dialysis per nephrology  Hyperkalemia K 5.2 on 2/12, resolved with single dose Lokelma.  BMP in AM. Dialysis planned for tonight.  PAF (paroxysmal atrial fibrillation) (HCC) on chronic anticoagulation- (present on admission) Patient appears to be in sinus rhythm at this time and rate controlled.  Home medication regimen includes Eliquis which she last took this morning. -Hold Eliquis until PD cath exchange done -Heparin per pharmacy   ESRD on peritoneal dialysis [facility]) Patient had recurrent leaking of PD catheter last night with PD first night of admission Nephrology following. General surgery consult to address PD cath. Monitor volume status and  electrolytes.  Anemia in chronic kidney disease- (present on admission) Acute on chronic.  On admission hemoglobin 8.7 g/dL, but previously had been 11.8 back in 11/2020.  Patient has prior history of GI bleed, but denies any reports of bleeding or black/dark stools. -Recheck CBC tomorrow morning  Chronic anticoagulation On Eliquis for A-fib  DM type 2 (diabetes mellitus, type 2) ([place]) Diet controlled.  Currently not on any medication for treatment.  Hyperlipidemia- (present on admission) - Continue Crestor  Benign essential HTN- (present on admission) Home medication regimen includes amlodipine 10 mg daily, isosorbide mononitrate 60 mg daily, losartan 100 mg daily, and metoprolol 150 mg daily.        Subjective: Patient was awake sitting up in bed when seen today.  He denies any acute issues or complaints including fevers or chills, abdominal pain, nausea vomiting.  Says he is overall feeling very well.  Physical Exam: Vitals:   03/08/21 1918 03/09/21 0416 03/09/21 0419 03/09/21 0809  BP: 134/82 132/84  (!) 141/89  Pulse: 71 82  74  Resp: 16 18  18   Temp: 98.2 F (36.8 C) 98.4 F (36.9 C)  98.1 F (36.7 C)  TempSrc: Oral Oral  Oral  SpO2: 96% 96%  97%  Weight:   65.3 kg   Height:       General exam: awake, alert, no acute distress HEENT: atraumatic, clear conjunctiva, anicteric sclera, moist mucus membranes, hearing grossly normal  Respiratory system: CTAB, no wheezes, rales or rhonchi, normal respiratory effort. Cardiovascular system: normal S1/S2,  RRR, no JVD, murmurs, rubs, gallops,  no pedal edema.   Gastrointestinal system: Sunken abdomen, PD catheter noted, nontender. Central nervous system:  A&O x3. no gross focal neurologic deficits, normal speech Extremities: moves all, no edema, normal tone Skin: dry, intact, normal temperature Psychiatry: normal mood, congruent affect, judgement and insight appear normal   Data Reviewed:  Labs reviewed and notable for  chloride 112, CO2 20, BUN 38, creatinine 5.11, calcium 8.5, hemoglobin improved from 8.7-9.4, platelets improved to 157 from 149  Family Communication: None  Disposition: Status is: Inpatient Remains inpatient appropriate because: Pending trial of peritoneal dialysis tonight after PD catheter exchange done today by surgery      Planned Discharge Destination: Home     Time spent: 35 minutes  Author: Ezekiel Slocumb, DO 03/09/2021 2:50 PM  For on call review [email or web address].

## 2021-03-09 NOTE — Progress Notes (Signed)
[place] Hospital Day(s): 0.   Interval History:  Patient seen and examined No acute events or new complaints overnight.  Patient reports he is doing well No abdominal pain, nausea, emesis Labs are consistent with ESRD Plan for PD catheter revision today with Dr Christian Mate   Vital signs in last 24 hours: [min-max] current  Temp:  [98.1 F (36.7 C)-98.4 F (36.9 C)] 98.4 F (36.9 C) (02/13 0416) Pulse Rate:  [71-84] 82 (02/13 0416) Resp:  [16-18] 18 (02/13 0416) BP: (126-166)/(76-84) 132/84 (02/13 0416) SpO2:  [95 %-97 %] 96 % (02/13 0416) Weight:  [65.3 kg] 65.3 kg (02/13 0419)     Height: 5\' 9"  (175.3 cm) Weight: 65.3 kg BMI (Calculated): 21.25   Intake/Output last 2 shifts:  02/12 0701 - 02/13 0700 In: 1139.5 [P.O.:960; I.V.:179.5] Out: -    Physical Exam:  Constitutional: alert, cooperative and no distress  Respiratory: breathing non-labored at rest  Cardiovascular: regular rate and sinus rhythm  Gastrointestinal: soft, non-tender, and non-distended, PD catheter in left abdomen, site CDI Integumentary: warm, dry   Labs:  CBC Latest Ref Rng & Units 03/08/2021 03/07/2021 12/16/2020  WBC 4.0 - 10.5 K/uL 7.2 6.3 6.3  Hemoglobin 13.0 - 17.0 g/dL 8.7(L) 8.7(L) 11.8(L)  Hematocrit 39.0 - 52.0 % 27.1(L) 27.1(L) 37.0(L)  Platelets 150 - 400 K/uL 149(L) 162 173   CMP Latest Ref Rng & Units 03/08/2021 03/07/2021 12/16/2020  Glucose 70 - 99 mg/dL 90 125(H) 92  BUN 8 - 23 mg/dL 35(H) 35(H) 34(H)  Creatinine 0.61 - 1.24 mg/dL 4.47(H) 4.46(H) 3.94(H)  Sodium 135 - 145 mmol/L 139 139 141  Potassium 3.5 - 5.1 mmol/L 5.2(H) 4.0 4.5  Chloride 98 - 111 mmol/L 111 109 106  CO2 22 - 32 mmol/L 24 23 27   Calcium 8.9 - 10.3 mg/dL 8.3(L) 8.4(L) 8.2(L)  Total Protein 6.5 - 8.1 g/dL - - 5.8(L)  Total Bilirubin 0.3 - 1.2 mg/dL - - 0.2(L)  Alkaline Phos 38 - 126 U/L - - 106  AST 15 - 41 U/L - - 14(L)  ALT 0 - 44 U/L - - 11    Imaging studies: No  new pertinent imaging studies   Assessment/Plan:  71 y.o. male with PD Catheter issue   - PD Exchange scheduled for today with Dr Christian Mate pending OR/anesthesia availability   - All risks, benefits, and alternatives to above procedure(s) were discussed with the patient, all of his questions were answered to his expressed satisfaction, patient expresses he wishes to proceed, and informed consent was obtained.   - NPO + IVF Resuscitation  - Monitor abdominal examination   - Further management per primary service; we will follow    All of the above findings and recommendations were discussed with the patient, and the medical team, and all of patient's questions were answered to his expressed satisfaction.  -- Edison Simon, PA-C [facility] 03/09/2021, 7:56 AM [phone] M-F: 7am - 4pm

## 2021-03-09 NOTE — Consult Note (Signed)
ANTICOAGULATION CONSULT NOTE  Pharmacy Consult for heparin infusion Indication: atrial fibrillation - apixaban PTA   Allergies  Allergen Reactions   Morphine Anxiety    agitation     Patient Measurements: Height: 5\' 9"  (175.3 cm) Weight: 65.3 kg (143 lb 15.4 oz) IBW/kg (Calculated) : 70.7 Heparin Dosing Weight: 68 kg  Vital Signs: Temp: 98.1 F (36.7 C) (02/13 0809) Temp Source: Oral (02/13 0809) BP: 141/89 (02/13 0809) Pulse Rate: 74 (02/13 0809)  Labs: Recent Labs    03/07/21 1426 03/07/21 1713 03/08/21 0408 03/08/21 1421 03/09/21 0001 03/09/21 0839  HGB 8.7*  --  8.7*  --   --  9.4*  HCT 27.1*  --  27.1*  --   --  28.6*  PLT 162  --  149*  --   --  157  APTT  --    < > 132* 105* 74* 69*  HEPARINUNFRC  --    < > 0.76*  --  0.32 0.33  CREATININE 4.46*  --  4.47*  --   --  5.11*   < > = values in this interval not displayed.     Estimated Creatinine Clearance: 12.6 mL/min (A) (by C-G formula based on SCr of 5.11 mg/dL (H)).   Medical History: Past Medical History:  Diagnosis Date   Acute diverticulitis 06/14/2014   Anemia    Anginal pain (HCC)    Aortic atherosclerosis (HCC)    Atrial fibrillation (HCC)    CKD (chronic kidney disease) stage 3, GFR 30-59 ml/min (HCC)    Coronary artery disease    Depression    Diabetes mellitus without complication (HCC)    Diverticulosis    GERD (gastroesophageal reflux disease)    GI bleed 05/15/2019   Gout    Heart murmur    Hematemesis 05/15/2019   High cholesterol    Hypertension    STEMI (ST elevation myocardial infarction) ([place]) 2003   Inferior    Medications:  Apixaban 2.5 mg PTA: last dose 03/07/21 AM  Assessment: 71 y.o. male with past medical history of atrial fibrillation on Eliquis, and ESRD on PD presented to ED 03/07/21 complaining of PD catheter malfunction. Surgery consulted for replacement. Pharmacy has been consulted to transition from apixaban to heparin infusion for possible  procedure.  Baseline: aPTT 44, HL 0.74 (elevated based on prior apixaban exposure)  Date Time aPTT/HL Rate/comment  2/12  0408 132/0.76 1000 un/hr, supratherapeutic 2/12 1421 105  800 un/hr, supratherapeutic 2/13 0001 74/0.32 750 un/hr, therapeutic x 1 2/13 0839 69/0.33 750 un/hr, therapeutic x 2   Goal of Therapy:  Heparin level 0.3-0.7 units/ml aPTT 66-102 seconds Monitor platelets by anticoagulation protocol: Yes   Plan:  Continue heparin infusion rate at 750 units/hr Transitioning to HL monitoring/dosing as aPTT is in correlation. Recheck HL level with AM labs.  CBC daily while on heparin.  Pearla Dubonnet, PharmD Clinical Pharmacist 03/09/2021 12:05 PM

## 2021-03-09 NOTE — Progress Notes (Signed)
Patient consent signed for Peritoneal Dialysis Catheter Repair. 03/09/21, 4417.

## 2021-03-09 NOTE — Consult Note (Signed)
ANTICOAGULATION CONSULT NOTE  Pharmacy Consult for heparin infusion Indication: atrial fibrillation - apixaban PTA   Allergies  Allergen Reactions   Morphine Anxiety    agitation     Patient Measurements: Height: 5\' 9"  (175.3 cm) Weight: 68 kg (150 lb) IBW/kg (Calculated) : 70.7 Heparin Dosing Weight: 68 kg  Vital Signs: Temp: 98.2 F (36.8 C) (02/12 1918) Temp Source: Oral (02/12 1918) BP: 134/82 (02/12 1918) Pulse Rate: 71 (02/12 1918)  Labs: Recent Labs    03/07/21 1426 03/07/21 1713 03/07/21 1713 03/08/21 0408 03/08/21 1421 03/09/21 0001  HGB 8.7*  --   --  8.7*  --   --   HCT 27.1*  --   --  27.1*  --   --   PLT 162  --   --  149*  --   --   APTT  --  44*   < > 132* 105* 74*  HEPARINUNFRC  --  0.78*  --  0.76*  --  0.32  CREATININE 4.46*  --   --  4.47*  --   --    < > = values in this interval not displayed.     Estimated Creatinine Clearance: 15 mL/min (A) (by C-G formula based on SCr of 4.47 mg/dL (H)).   Medical History: Past Medical History:  Diagnosis Date   Acute diverticulitis 06/14/2014   Anemia    Anginal pain (HCC)    Aortic atherosclerosis (HCC)    Atrial fibrillation (HCC)    CKD (chronic kidney disease) stage 3, GFR 30-59 ml/min (HCC)    Coronary artery disease    Depression    Diabetes mellitus without complication (HCC)    Diverticulosis    GERD (gastroesophageal reflux disease)    GI bleed 05/15/2019   Gout    Heart murmur    Hematemesis 05/15/2019   High cholesterol    Hypertension    STEMI (ST elevation myocardial infarction) ([place]) 2003   Inferior    Medications:  Apixaban 2.5 mg PTA: last dose 03/07/21 AM  Assessment: 71 y.o. male with past medical history of atrial fibrillation on Eliquis, and ESRD on PD presented to ED 03/07/21 complaining of PD catheter malfunction. Surgery consulted for replacement. Pharmacy has been consulted to transition from apixaban to heparin infusion for possible procedure.  Baseline: aPTT 44,  HL 0.74 (elevated based on prior apixaban exposure)  Date Time aPTT/HL Rate/comment  2/12  0408 132/0.76 1000 un/hr, supratherapeutic 2/12 1421 105  800 un/hr, supratherapeutic 2/13 0001 74/0.32 750 un/hr, therapeutic x 1   Goal of Therapy:  Heparin level 0.3-0.7 units/ml aPTT 66-102 seconds Monitor platelets by anticoagulation protocol: Yes   Plan:  Continue heparin infusion rate at 750 units/hr Recheck aPTT/HL level in 8 hr to confirm.  Will transition to HL once correlation w/ aPTT confirmed. CBC daily while on heparin.  Renda Rolls, PharmD, [facility] 03/09/2021 12:56 AM

## 2021-03-09 NOTE — Consult Note (Signed)
Pharmacy Antibiotic Note  Casey French is a 71 y.o. male admitted on 03/07/2021 with  PD catheter site infection .  Pharmacy has been consulted for Vancomycin dosing. Patient also has ceftriaxone 1g IV daily.  Plan: Per Nephrology's recommendation, will order Vancomycin 1g IV 3 times a week(MWF) since patient is receiving peritoneal dialysis  Will order Vancomycin levels when appropriate.   Height: 5\' 9"  (175.3 cm) Weight: 65.3 kg (143 lb 15.4 oz) IBW/kg (Calculated) : 70.7  Temp (24hrs), Avg:98.3 F (36.8 C), Min:98.1 F (36.7 C), Max:98.4 F (36.9 C)  Recent Labs  Lab 03/07/21 1426 03/08/21 0408 03/09/21 0839  WBC 6.3 7.2 6.0  CREATININE 4.46* 4.47* 5.11*    Estimated Creatinine Clearance: 12.6 mL/min (A) (by C-G formula based on SCr of 5.11 mg/dL (H)).    Allergies  Allergen Reactions   Morphine Anxiety    agitation     Antimicrobials this admission: Ceftriaxone 2/12 >>  Vancomycin 2/13 >>   Microbiology results: 2/11 Peritoneal fluid cx: NGTD   Thank you for allowing pharmacy to be a part of this patients care.  Lance Coon A Casey French 03/09/2021 2:19 PM

## 2021-03-10 ENCOUNTER — Inpatient Hospital Stay: Payer: Medicare PPO

## 2021-03-10 ENCOUNTER — Inpatient Hospital Stay: Payer: Medicare PPO | Admitting: Oncology

## 2021-03-10 ENCOUNTER — Ambulatory Visit: Admission: RE | Admit: 2021-03-10 | Payer: Medicare PPO | Source: Ambulatory Visit

## 2021-03-10 DIAGNOSIS — T85631A Leakage of intraperitoneal dialysis catheter, initial encounter: Secondary | ICD-10-CM | POA: Diagnosis not present

## 2021-03-10 DIAGNOSIS — T85611A Breakdown (mechanical) of intraperitoneal dialysis catheter, initial encounter: Secondary | ICD-10-CM | POA: Diagnosis not present

## 2021-03-10 LAB — BASIC METABOLIC PANEL
Anion gap: 7 (ref 5–15)
BUN: 47 mg/dL — ABNORMAL HIGH (ref 8–23)
CO2: 20 mmol/L — ABNORMAL LOW (ref 22–32)
Calcium: 8.4 mg/dL — ABNORMAL LOW (ref 8.9–10.3)
Chloride: 114 mmol/L — ABNORMAL HIGH (ref 98–111)
Creatinine, Ser: 5.34 mg/dL — ABNORMAL HIGH (ref 0.61–1.24)
GFR, Estimated: 11 mL/min — ABNORMAL LOW (ref >60)
Glucose, Bld: 92 mg/dL (ref 70–99)
Potassium: 4.9 mmol/L (ref 3.5–5.1)
Sodium: 141 mmol/L (ref 135–145)

## 2021-03-10 LAB — HEPARIN LEVEL (UNFRACTIONATED): Heparin Unfractionated: 0.16 IU/mL — ABNORMAL LOW (ref 0.30–0.70)

## 2021-03-10 MED ORDER — HEPARIN BOLUS VIA INFUSION
2000.0000 [IU] | Freq: Once | INTRAVENOUS | Status: AC
  Administered 2021-03-10: 2000 [IU] via INTRAVENOUS
  Filled 2021-03-10: qty 2000

## 2021-03-10 MED ORDER — GENTAMICIN SULFATE 0.1 % EX CREA: 1.0000 "application " | TOPICAL_CREAM | Freq: Every day | CUTANEOUS | 0 refills | Status: DC

## 2021-03-10 NOTE — Progress Notes (Signed)
Dialysis on-call is notified to ensure that no current treatment was scheduled for tonight. On call Nurse verifies that no orders are for PD on 03/09/21.

## 2021-03-10 NOTE — Progress Notes (Signed)
[facility]  ROUNDING NOTE   Subjective:   [name] was admitted to [facility] on 03/07/2021 for Peritoneal dialysis catheter dysfunction [facility]) [G25.427C] Peritoneal dialysis catheter dysfunction, initial encounter [facility]) [T85.611A]  Patient seen resting quietly, alert and oriented Tolerating meals without nausea and vomiting Denies shortness of breath and cough    Objective:  Vital signs in last 24 hours:  Temp:  [97.5 F (36.4 C)-98.5 F (36.9 C)] 98.5 F (36.9 C) (02/14 0813) Pulse Rate:  [82-89] 86 (02/14 0813) Resp:  [18-20] 18 (02/14 0813) BP: (136-160)/(81-93) 148/93 (02/14 0813) SpO2:  [94 %-99 %] 94 % (02/14 0813)  Weight change:  Filed Weights   03/07/21 1430 03/09/21 0419  Weight: 68 kg 65.3 kg    Intake/Output: I/O last 3 completed shifts: In: 429.7 [P.O.:120; I.V.:206.4; IV Piggyback:103.3] Out: -    Intake/Output this shift:  Total I/O In: 613 [P.O.:240; I.V.:176.3; IV Piggyback:196.7] Out: -   Physical Exam: General: NAD, resting in bed  Head: Normocephalic, atraumatic. Moist oral mucosal membranes  Eyes: Anicteric  Lungs:  Clear to auscultation, normal effort  Heart: Regular rate and rhythm  Abdomen:  Soft, nontender  Extremities:  no peripheral edema.  Neurologic: Nonfocal, moving all four extremities  Skin: No lesions  Access: PD catheter - exit site and tunnel wnl    Basic Metabolic Panel: Recent Labs  Lab 03/07/21 1426 03/08/21 0408 03/09/21 0839 03/10/21 0452  NA 139 139 139 141  K 4.0 5.2* 4.2 4.9  CL 109 111 112* 114*  CO2 23 24 20* 20*  GLUCOSE 125* 90 93 92  BUN 35* 35* 38* 47*  CREATININE 4.46* 4.47* 5.11* 5.34*  CALCIUM 8.4* 8.3* 8.5* 8.4*  PHOS  --  4.5  --   --      Liver Function Tests: Recent Labs  Lab 03/08/21 0408  ALBUMIN 2.4*    No results for input(s): LIPASE, AMYLASE in the last 168 hours. No results for input(s): AMMONIA in the last 168 hours.  CBC: Recent Labs  Lab  03/07/21 1426 03/08/21 0408 03/09/21 0839  WBC 6.3 7.2 6.0  NEUTROABS 4.7  --   --   HGB 8.7* 8.7* 9.4*  HCT 27.1* 27.1* 28.6*  MCV 98.2 94.8 95.3  PLT 162 149* 157     Cardiac Enzymes: No results for input(s): CKTOTAL, CKMB, CKMBINDEX, TROPONINI in the last 168 hours.  BNP: Invalid input(s): POCBNP  CBG: No results for input(s): GLUCAP in the last 168 hours.  Microbiology: Results for orders placed or performed during the hospital encounter of 03/07/21  Resp Panel by RT-PCR (Flu A&B, Covid) Nasopharyngeal Swab     Status: None   Collection Time: 03/07/21  2:26 PM   Specimen: Nasopharyngeal Swab; Nasopharyngeal(NP) swabs in vial transport medium  Result Value Ref Range Status   SARS Coronavirus 2 by RT PCR NEGATIVE NEGATIVE Final    Comment: (NOTE) SARS-CoV-2 target nucleic acids are NOT DETECTED.  The SARS-CoV-2 RNA is generally detectable in upper respiratory specimens during the acute phase of infection. The lowest concentration of SARS-CoV-2 viral copies this assay can detect is 138 copies/mL. A negative result does not preclude SARS-Cov-2 infection and should not be used as the sole basis for treatment or other patient management decisions. A negative result may occur with  improper specimen collection/handling, submission of specimen other than nasopharyngeal swab, presence of viral mutation(s) within the areas targeted by this assay, and inadequate number of viral copies(<138 copies/mL). A negative result  must be combined with clinical observations, patient history, and epidemiological information. The expected result is Negative.  Fact Sheet for Patients:  EntrepreneurPulse.com.au  Fact Sheet for Healthcare Providers:  IncredibleEmployment.be  This test is no t yet approved or cleared by the [place] FDA and  has been authorized for detection and/or diagnosis of SARS-CoV-2 by FDA under an Emergency Use Authorization  (EUA). This EUA will remain  in effect (meaning this test can be used) for the duration of the COVID-19 declaration under Section 564(b)(1) of the Act, 21 U.S.C.section 360bbb-3(b)(1), unless the authorization is terminated  or revoked sooner.       Influenza A by PCR NEGATIVE NEGATIVE Final   Influenza B by PCR NEGATIVE NEGATIVE Final    Comment: (NOTE) The Xpert Xpress SARS-CoV-2/FLU/RSV plus assay is intended as an aid in the diagnosis of influenza from Nasopharyngeal swab specimens and should not be used as a sole basis for treatment. Nasal washings and aspirates are unacceptable for Xpert Xpress SARS-CoV-2/FLU/RSV testing.  Fact Sheet for Patients: EntrepreneurPulse.com.au  Fact Sheet for Healthcare Providers: IncredibleEmployment.be  This test is not yet approved or cleared by the [place] FDA and has been authorized for detection and/or diagnosis of SARS-CoV-2 by FDA under an Emergency Use Authorization (EUA). This EUA will remain in effect (meaning this test can be used) for the duration of the COVID-19 declaration under Section 564(b)(1) of the Act, 21 U.S.C. section 360bbb-3(b)(1), unless the authorization is terminated or revoked.  Performed at [facility], [address] [place], [place] [address]   Body fluid culture w Gram Stain     Status: None (Preliminary result)   Collection Time: 03/07/21  9:30 PM   Specimen: Peritoneal Washings; Body Fluid  Result Value Ref Range Status   Specimen Description   Final    PERITONEAL Performed at [facility], [address], [place], [place] [address]    Special Requests   Final    NONE Performed at [facility], [place], [place] [address]    Gram Stain NO WBC SEEN NO ORGANISMS SEEN   Final   Culture   Final    NO GROWTH 2 DAYS Performed at [facility], [place] [address], [place],  [address]    Report Status PENDING   Incomplete    Coagulation Studies: No results for input(s): LABPROT, INR in the last 72 hours.  Urinalysis: No results for input(s): COLORURINE, LABSPEC, PHURINE, GLUCOSEU, HGBUR, BILIRUBINUR, KETONESUR, PROTEINUR, UROBILINOGEN, NITRITE, LEUKOCYTESUR in the last 72 hours.  Invalid input(s): APPERANCEUR    Imaging: No results found.   Medications:    cefTRIAXone (ROCEPHIN)  IV 1 g (03/09/21 1421)   dialysis solution 1.5% low-MG/low-CA     heparin 1,000 Units/hr (03/10/21 0630)   vancomycin 1,000 mg (03/09/21 1759)    allopurinol  100 mg Oral Daily   amLODipine  10 mg Oral Daily   calcitRIOL  0.25 mcg Oral Daily   gabapentin  300 mg Oral QHS   gentamicin cream  1 application Topical Daily   isosorbide mononitrate  60 mg Oral Daily   metoprolol succinate  150 mg Oral Daily   pantoprazole  40 mg Oral Daily   rosuvastatin  40 mg Oral Daily   sodium chloride flush  3 mL Intravenous Q12H   acetaminophen **OR** acetaminophen, albuterol, methocarbamol  Assessment/ Plan:  [name] is a 71 y.o. White male with end stage renal disease on peritoneal dialysis,  atrial fibrillation, coronary artery disease, diabetes mellitus type II, diverticulosis, gout, hyperlipidemia who presents to [facility] on 03/07/[facility]21 for Peritoneal dialysis catheter dysfunction [facility]) [I50.277A] Peritoneal dialysis catheter dysfunction, initial encounter [facility]) [T85.611A]  [facility] 67kg CCPD 9 hours 4 exchanges 2580mL fills   End stage renal disease on peritoneal dialysis: now with complication of dialysis device. Last peritoneal dialysis was last night. Will prepare for peritoneal dialysis tonight.  -Surgery modified PD catheter at bedside yesterday. - HD RN was able to flush PD catheter today without leaking. - Patient cleared to discharge from our stance to resume nightly PD treatments at home.  Hypertension: Home regimen of amlodipine, losartan, isosorbide mononitrate, metoprolol and  torsemide.  BP 148/93 Stable for this patient   Anemia of chronic kidney disease: hemoglobin 9.4 Mircera as outpatient.   Secondary Hyperparathyroidism: outpatient PTH, calcium and phos at goal.  Continue calcitriol.    LOS: 1 [place] 2/14/20233:47 PM

## 2021-03-10 NOTE — Consult Note (Signed)
ANTICOAGULATION CONSULT NOTE  Pharmacy Consult for heparin infusion Indication: atrial fibrillation - apixaban PTA   Allergies  Allergen Reactions   Morphine Anxiety    agitation     Patient Measurements: Height: 5\' 9"  (175.3 cm) Weight: 65.3 kg (143 lb 15.4 oz) IBW/kg (Calculated) : 70.7 Heparin Dosing Weight: 68 kg  Vital Signs: Temp: 98.4 F (36.9 C) (02/14 0535) Temp Source: Oral (02/13 2021) BP: 136/84 (02/14 0535) Pulse Rate: 84 (02/14 0535)  Labs: Recent Labs    03/07/21 1426 03/07/21 1713 03/08/21 0408 03/08/21 1421 03/09/21 0001 03/09/21 0839 03/10/21 0452  HGB 8.7*  --  8.7*  --   --  9.4*  --   HCT 27.1*  --  27.1*  --   --  28.6*  --   PLT 162  --  149*  --   --  157  --   APTT  --    < > 132* 105* 74* 69*  --   HEPARINUNFRC  --    < > 0.76*  --  0.32 0.33 0.16*  CREATININE 4.46*  --  4.47*  --   --  5.11* 5.34*   < > = values in this interval not displayed.     Estimated Creatinine Clearance: 12.1 mL/min (A) (by C-G formula based on SCr of 5.34 mg/dL (H)).   Medical History: Past Medical History:  Diagnosis Date   Acute diverticulitis 06/14/2014   Anemia    Anginal pain (HCC)    Aortic atherosclerosis (HCC)    Atrial fibrillation (HCC)    CKD (chronic kidney disease) stage 3, GFR 30-59 ml/min (HCC)    Coronary artery disease    Depression    Diabetes mellitus without complication (HCC)    Diverticulosis    GERD (gastroesophageal reflux disease)    GI bleed 05/15/2019   Gout    Heart murmur    Hematemesis 05/15/2019   High cholesterol    Hypertension    STEMI (ST elevation myocardial infarction) ([place]) 2003   Inferior    Medications:  Apixaban 2.5 mg PTA: last dose 03/07/21 AM  Assessment: 71 y.o. male with past medical history of atrial fibrillation on Eliquis, and ESRD on PD presented to ED 03/07/21 complaining of PD catheter malfunction. Surgery consulted for replacement. Pharmacy has been consulted to transition from apixaban to  heparin infusion for possible procedure.  Baseline: aPTT 44, HL 0.74 (elevated based on prior apixaban exposure)  Date Time aPTT/HL Rate/comment  2/12  0408 132/0.76 1000 un/hr, supratherapeutic 2/12 1421 105  800 un/hr, supratherapeutic 2/13 0001 74/0.32 750 un/hr, therapeutic x 1 2/13 0839 69/0.33 750 un/hr, therapeutic x 2  2/14     0452     0.16               750 un/hr, SUBtherapeutic,  RN confirms no interruptions in heparin gtt   Goal of Therapy:  Heparin level 0.3-0.7 units/ml aPTT 66-102 seconds Monitor platelets by anticoagulation protocol: Yes   Plan:  2/14:  HL @ 0452 = 0.16, SUBtherapeutic  Will order heparin 2000 units IV X 1 and increase drip rate to 1000 units/hr.  Will recheck HL 8 hrs after rate change.   Orene Desanctis, PharmD Clinical Pharmacist 03/10/2021 6:16 AM

## 2021-03-10 NOTE — Discharge Summary (Signed)
Physician Discharge Summary   Patient: Casey French MRN: [id number] DOB: [birth year]  Admit date:     03/07/2021  Discharge date: 03/25/21  Discharge Physician: Ezekiel Slocumb   PCP: [name], MD   Recommendations at discharge:    Resume PD at home tonight Follow up with nephrology as scheduled Follow up with PCP in 1-2 weeks Repeat CBC/BMP in 1-2 weeks  Discharge Diagnoses: Principal Problem:   Peritoneal dialysis catheter dysfunction ([place])  ESRD on peritoneal dialysis Active Problems:   Benign essential HTN   Hyperlipidemia   DM type 2 (diabetes mellitus, type 2) ([place])   Chronic anticoagulation   Anemia in chronic kidney disease   ESRD on peritoneal dialysis ([place])   PAF (paroxysmal atrial fibrillation) ([place]) on chronic anticoagulation   Hyperkalemia    Hospital Course: KORBAN SHEARER is a 71 y.o. male with medical history significant of hypertension, hyperlipidemia, atrial fibrillation on Eliquis, CAD, DM type II,  ESRD on PD, and GI bleed who presents with complaints of peritoneal dialysis catheter leaking.  He was seen by general surgery and the leak was patched using Dermabond and Tegaderm.  Patient was admitted to attempt PD overnight, but the catheter again had leaking.    He will require the PD catheter likely to be exchanged. Nephrology is following for dialysis.  Assessment and Plan: * Peritoneal dialysis catheter dysfunction ([place])  ESRD on peritoneal dialysis- (present on admission) Patient's wife accidentally nicked his peritoneal dialysis catheter last night and was leaking for which she was unable to dialyze.  No signs of infection near catheter site.   -- General surgery and nephrology consulted --PD catheter was patched with Dermabond, but had leaking when PD attempted 1st night.  --Empirically got Vanc and Rocephin in ED, continue these per nephrology --Follow peritoneal fluid culture - neg to date  --Dialysis per  nephrology  Hyperkalemia K 5.2 on 2/12, resolved with single dose Lokelma.  BMP in follow up.  Dialysis per nephrology.  PAF (paroxysmal atrial fibrillation) (HCC) on chronic anticoagulation- (present on admission) Patient appears to be in sinus rhythm at this time and rate controlled.  Home medication regimen includes Eliquis which she last took this morning. -Resume Eliquis (held for procedure) -stop heparin   ESRD on peritoneal dialysis [facility]) Patient had recurrent leaking of PD catheter last night with PD first night of admission Nephrology following. General surgery consult to address PD cath. Monitor volume status and electrolytes.  Anemia in chronic kidney disease- (present on admission) Acute on chronic.  On admission hemoglobin 8.7 g/dL, but previously had been 11.8 back in 11/2020.  Patient has prior history of GI bleed, but denies any reports of bleeding or black/dark stools. --Monitor CBC in follow up  Chronic anticoagulation On Eliquis for A-fib  DM type 2 (diabetes mellitus, type 2) ([place]) Diet controlled.  Currently not on any medication for treatment.  Hyperlipidemia- (present on admission) - Continue Crestor  Benign essential HTN- (present on admission) Home medication regimen includes amlodipine 10 mg daily, isosorbide mononitrate 60 mg daily, losartan 100 mg daily, and metoprolol 150 mg daily.           Consultants: general surgery, nephrology Procedures performed: PD catheter patch, repair Disposition: Home Diet recommendation:  Discharge Diet Orders (From admission, onward)     Start     Ordered   03/10/21 0000  Diet - low sodium heart healthy        03/10/21 1121  Renal diet  DISCHARGE MEDICATION: Allergies as of 03/10/2021       Reactions   Morphine Anxiety   agitation        Medication List     STOP taking these medications    torsemide 10 MG tablet Commonly known as: DEMADEX       TAKE these medications     allopurinol 100 MG tablet Commonly known as: ZYLOPRIM Take 100 mg by mouth daily.   amLODipine 10 MG tablet Commonly known as: NORVASC Take 10 mg by mouth daily.   apixaban 2.5 MG Tabs tablet Commonly known as: ELIQUIS Take 2.5 mg by mouth 2 (two) times daily.   aspirin 81 MG EC tablet Take 81 mg by mouth daily.   calcitRIOL 0.25 MCG capsule Commonly known as: ROCALTROL Take 0.25 mcg by mouth daily.   folic acid 1 MG tablet Commonly known as: FOLVITE TAKE 2 TABLETS(2 MG) BY MOUTH DAILY   gabapentin 300 MG capsule Commonly known as: NEURONTIN Take 300 mg by mouth at bedtime.   gentamicin cream 0.1 % Commonly known as: GARAMYCIN Apply 1 application topically daily.   hydrocortisone 2.5 % rectal cream Commonly known as: ANUSOL-HC Place 1 application rectally 2 (two) times daily as needed for hemorrhoids or anal itching.   isosorbide mononitrate 60 MG 24 hr tablet Commonly known as: IMDUR Take 60 mg by mouth daily.   losartan 100 MG tablet Commonly known as: COZAAR Take 100 mg by mouth daily.   methocarbamol 500 MG tablet Commonly known as: ROBAXIN Take 500 mg by mouth 3 (three) times daily as needed.   metoprolol succinate 100 MG 24 hr tablet Commonly known as: TOPROL-XL Take 150 mg by mouth daily. Take with or immediately following a meal.   nitroGLYCERIN 0.4 MG SL tablet Commonly known as: NITROSTAT Place 0.4 mg under the tongue every 5 (five) minutes as needed for chest pain.   omeprazole 20 MG capsule Commonly known as: PRILOSEC Take 20 mg by mouth daily.   psyllium 58.6 % packet Commonly known as: METAMUCIL Take 1 packet by mouth 2 (two) times daily.   rosuvastatin 40 MG tablet Commonly known as: CRESTOR Take 40 mg by mouth daily.   Vitamin D (Ergocalciferol) 1.25 MG (50000 UNIT) Caps capsule Commonly known as: DRISDOL Take 50,000 Units by mouth once a week.               Discharge Care Instructions  (From admission, onward)            Start     Ordered   03/10/21 0000  Discharge wound care:       Comments: As above   03/10/21 1121             Discharge Exam: Filed Weights   03/07/21 1430 03/09/21 0419  Weight: 68 kg 65.3 kg   General exam: awake, alert, no acute distress HEENT: moist mucus membranes, hearing grossly normal  Respiratory system: CTAB, no wheezes, rales or rhonchi, normal respiratory effort. Cardiovascular system: normal S1/S2, RRR, no pedal edema.   Gastrointestinal system: PD catherter in place, soft, NT, ND, no HSM felt, +bowel sounds. Central nervous system: A&O x3. no gross focal neurologic deficits, normal speech Extremities: moves all, no edema, normal tone Skin: dry, intact, normal temperature Psychiatry: normal mood, congruent affect, judgement and insight appear normal   Condition at discharge: stable  The results of significant diagnostics from this hospitalization (including imaging, microbiology, ancillary and laboratory) are listed below for reference.  Imaging Studies: CT CHEST ABDOMEN PELVIS W CONTRAST  Result Date: 03/12/2021 CLINICAL DATA:  Elevated PSA, stage IV CKD. EXAM: CT CHEST, ABDOMEN, AND PELVIS WITH CONTRAST TECHNIQUE: Multidetector CT imaging of the chest, abdomen and pelvis was performed following the standard protocol during bolus administration of intravenous contrast. RADIATION DOSE REDUCTION: This exam was performed according to the departmental dose-optimization program which includes automated exposure control, adjustment of the mA and/or kV according to patient size and/or use of iterative reconstruction technique. CONTRAST:  11mL OMNIPAQUE IOHEXOL 300 MG/ML  SOLN COMPARISON:  CT February 03, 2020 FINDINGS: CT CHEST FINDINGS Cardiovascular: Aortic and branch vessel atherosclerosis without aneurysmal dilation. No central pulmonary embolus on this nondedicated study. Coronary artery calcifications. Normal size heart. No significant pericardial  effusion/thickening. Mediastinum/Nodes: No supraclavicular adenopathy. No discrete thyroid nodule. No pathologically enlarged mediastinal, hilar or axillary lymph nodes. Retained versus refluxed contrast in the esophagus. Lungs/Pleura: Paraseptal emphysema. Mild biapical pleuroparenchymal scarring. No suspicious pulmonary nodule. Linear band like atelectasis in the right middle lobe. No pleural effusion or pneumothorax. Musculoskeletal: Multilevel degenerative changes spine. No aggressive lytic or blastic lesion of bone. CT ABDOMEN PELVIS FINDINGS Hepatobiliary: No suspicious hepatic lesion. Gallbladder is unremarkable. No biliary ductal dilation. Pancreas: No pancreatic ductal dilation or evidence of acute inflammation. Spleen: Normal in size without focal abnormality. Adrenals/Urinary Tract: Bilateral adrenal glands appear normal. No hydronephrosis. Hypodense exophytic right lower pole renal cyst measuring 17 mm. Symmetric bilateral renal enhancement. Evaluation of the urinary bladder is limited by incomplete distension and streak artifact from left hip arthroplasty. Stomach/Bowel: Radiopaque enteric contrast material traverses the descending colon. There is severe sigmoid colonic diverticulosis with similar sigmoid colonic wall thickening likely reflecting chronic segmental colitis associated with diverticulosis. No significant adjacent inflammatory stranding although evaluation of the sigmoid colon is limited by streak artifact from left hip arthroplasty and there is gas within the sigmoid mesentery. Additionally, there is small volume perihepatic pneumoperitoneum. Vascular/Lymphatic: Aortic and branch vessel atherosclerosis without abdominal aortic aneurysm. No pathologically enlarged abdominal or pelvic lymph nodes. Reproductive: Prostate is unremarkable. Other: No significant abdominopelvic free fluid. Peritoneal dialysis catheter with tip coiled in the pelvis. No walled off fluid collections.  Musculoskeletal: Left total hip arthroplasty. No aggressive lytic or blastic lesion of bone. No acute osseous abnormality. IMPRESSION: 1. No evidence of primary malignancy or metastatic disease in the chest, abdomen or pelvis. 2. Severe sigmoid colonic diverticulosis with similar chronic sigmoid colonic wall thickening without discrete evidence of acute inflammation although evaluation is limited by streak artifact from left hip arthroplasty. There is gas within the sigmoid mesentery the majority of which can be located within colonic diverticula, but there is also a small volume perihepatic pneumoperitoneum. However, there is a peritoneal dialysis catheter with tip coiled in the pelvis. The above findings are favored to reflect chronic segmental colitis associated with diverticulosis without acute diverticulitis, with the pneumoperitoneum related to the peritoneal dialysis catheter. However, as the sigmoid colon is incompletely evaluated in this area, secondary to streak artifact, suggest clinical correlation for signs/symptoms of diverticulitis and/or perforated hollow viscus. 3. Aortic Atherosclerosis (ICD10-I70.0) and Emphysema (ICD10-J43.9). These results will be called to the ordering clinician or representative by the Radiologist Assistant, and communication documented in the PACS or [employer]. Electronically Signed   By: Dahlia Bailiff M.D.   On: 03/12/2021 11:43    Microbiology: Results for orders placed or performed during the hospital encounter of 03/07/21  Resp Panel by RT-PCR (Flu A&B, Covid) Nasopharyngeal Swab  Status: None   Collection Time: 03/07/21  2:26 PM   Specimen: Nasopharyngeal Swab; Nasopharyngeal(NP) swabs in vial transport medium  Result Value Ref Range Status   SARS Coronavirus 2 by RT PCR NEGATIVE NEGATIVE Final    Comment: (NOTE) SARS-CoV-2 target nucleic acids are NOT DETECTED.  The SARS-CoV-2 RNA is generally detectable in upper respiratory specimens during the  acute phase of infection. The lowest concentration of SARS-CoV-2 viral copies this assay can detect is 138 copies/mL. A negative result does not preclude SARS-Cov-2 infection and should not be used as the sole basis for treatment or other patient management decisions. A negative result may occur with  improper specimen collection/handling, submission of specimen other than nasopharyngeal swab, presence of viral mutation(s) within the areas targeted by this assay, and inadequate number of viral copies(<138 copies/mL). A negative result must be combined with clinical observations, patient history, and epidemiological information. The expected result is Negative.  Fact Sheet for Patients:  EntrepreneurPulse.com.au  Fact Sheet for Healthcare Providers:  IncredibleEmployment.be  This test is no t yet approved or cleared by the [place] FDA and  has been authorized for detection and/or diagnosis of SARS-CoV-2 by FDA under an Emergency Use Authorization (EUA). This EUA will remain  in effect (meaning this test can be used) for the duration of the COVID-19 declaration under Section 564(b)(1) of the Act, 21 U.S.C.section 360bbb-3(b)(1), unless the authorization is terminated  or revoked sooner.       Influenza A by PCR NEGATIVE NEGATIVE Final   Influenza B by PCR NEGATIVE NEGATIVE Final    Comment: (NOTE) The Xpert Xpress SARS-CoV-2/FLU/RSV plus assay is intended as an aid in the diagnosis of influenza from Nasopharyngeal swab specimens and should not be used as a sole basis for treatment. Nasal washings and aspirates are unacceptable for Xpert Xpress SARS-CoV-2/FLU/RSV testing.  Fact Sheet for Patients: EntrepreneurPulse.com.au  Fact Sheet for Healthcare Providers: IncredibleEmployment.be  This test is not yet approved or cleared by the [place] FDA and has been authorized for detection and/or  diagnosis of SARS-CoV-2 by FDA under an Emergency Use Authorization (EUA). This EUA will remain in effect (meaning this test can be used) for the duration of the COVID-19 declaration under Section 564(b)(1) of the Act, 21 U.S.C. section 360bbb-3(b)(1), unless the authorization is terminated or revoked.  Performed at [facility], [address] [place], [place] [address]   Body fluid culture w Gram Stain     Status: None   Collection Time: 03/07/21  9:30 PM   Specimen: Peritoneal Washings; Body Fluid  Result Value Ref Range Status   Specimen Description   Final    PERITONEAL Performed at [facility], [address], [place], [place] [address]    Special Requests   Final    NONE Performed at [facility], [place], [place] [address]    Gram Stain NO WBC SEEN NO ORGANISMS SEEN   Final   Culture   Final    NO GROWTH 3 DAYS Performed at [facility], [place] [address], [place], [place] [address]    Report Status 03/11/2021 FINAL  Final    Labs: CBC: No results for input(s): WBC, NEUTROABS, HGB, HCT, MCV, PLT in the last 168 hours.  Basic Metabolic Panel: No results for input(s): NA, K, CL, CO2, GLUCOSE, BUN, CREATININE, CALCIUM, MG, PHOS in the last 168 hours.  Liver Function Tests: No results for input(s): AST, ALT, ALKPHOS, BILITOT, PROT, ALBUMIN in  the last 168 hours.  CBG: No results for input(s): GLUCAP in the last 168 hours.  Discharge time spent: less than 30 minutes.  Signed: Ezekiel Slocumb, DO [facility] 03/25/2021

## 2021-03-10 NOTE — Progress Notes (Signed)
Patient tolerated PD catheter flush without incident. No leaking noted from PD catheter. Sterility maintained. Patient looking forward to discharge.

## 2021-03-10 NOTE — TOC Transition Note (Signed)
Transition of Care [facility]) - CM/SW Discharge Note   Patient Details  Name: Casey French MRN: [id number] Date of Birth: Apr 19, 1950  Transition of Care [facility]) CM/SW Contact:  Candie Chroman, LCSW Phone Number: 03/10/2021, 11:29 AM   Clinical Narrative:  Patient has orders to discharge home today. No further concerns. CSW signing off.   Final next level of care: Home/Self Care Barriers to Discharge: No Barriers Identified   Patient Goals and CMS Choice        Discharge Placement                    Patient and family notified of of transfer: 03/10/21  Discharge Plan and Services     Post Acute Care Choice: NA                               Social Determinants of Health (SDOH) Interventions     Readmission Risk Interventions Readmission Risk Prevention Plan 03/10/2021 04/24/2020  Transportation Screening Complete Complete  PCP or Specialist Appt within 3-5 Days Complete Complete  HRI or [place] - Complete  Social Work Consult for [place] Planning/Counseling Complete Complete  Palliative Care Screening Not Applicable Not Applicable  Medication Review Press photographer) Complete Complete  Some recent data might be hidden

## 2021-03-10 NOTE — TOC Initial Note (Signed)
Transition of Care [facility]) - Initial/Assessment Note    Patient Details  Name: Casey French MRN: [id number] Date of Birth: [birth year]  [facility]) CM/SW Contact:    Candie Chroman, LCSW Phone Number: 03/10/2021, 9:23 AM  Clinical Narrative:   Readmission prevention screen complete. CSW met with patient. No supports at bedside. CSW introduced role and explained that discharge planning would be discussed. Patient lives home with his wife and pets. PCP is [name], MD. Patient drives himself to his appointments. Pharmacy is [facility] on [address]. No issues obtaining medications. No home health or DME use prior to admission. No further concerns. CSW encouraged patient to contact CSW as needed. CSW will continue to follow patient for support and facilitate return home when stable.               Expected Discharge Plan: Home/Self Care Barriers to Discharge: Continued Medical Work up   Patient Goals and CMS Choice        Expected Discharge Plan and Services Expected Discharge Plan: Home/Self Care     Post Acute Care Choice: NA Living arrangements for the past 2 months: Single Family Home                                      Prior Living Arrangements/Services Living arrangements for the past 2 months: Single Family Home Lives with:: Spouse, Pets Patient language and need for interpreter reviewed:: Yes Do you feel safe going back to the place where you live?: Yes      Need for Family Participation in Patient Care: Yes (Comment) Care giver support system in place?: Yes (comment)   Criminal Activity/Legal Involvement Pertinent to Current Situation/Hospitalization: No - Comment as needed  Activities of Daily Living Home Assistive Devices/Equipment: Grab bars in shower, Cane (specify quad or straight) ADL Screening (condition at time of admission) Patient's cognitive ability adequate to safely complete daily activities?: Yes Is the patient  deaf or have difficulty hearing?: No Does the patient have difficulty seeing, even when wearing glasses/contacts?: No Does the patient have difficulty concentrating, remembering, or making decisions?: No Patient able to express need for assistance with ADLs?: Yes Does the patient have difficulty dressing or bathing?: No Independently performs ADLs?: Yes (appropriate for developmental age) Does the patient have difficulty walking or climbing stairs?: No Weakness of Legs: None Weakness of Arms/Hands: None  Permission Sought/Granted                  Emotional Assessment Appearance:: Appears stated age Attitude/Demeanor/Rapport: Engaged, Gracious Affect (typically observed): Accepting, Appropriate, Calm, Pleasant Orientation: : Oriented to Self, Oriented to Place, Oriented to  Time, Oriented to Situation Alcohol / Substance Use: Not Applicable Psych Involvement: No (comment)  Admission diagnosis:  Peritoneal dialysis catheter dysfunction ([place]) [T85.611A] Peritoneal dialysis catheter dysfunction, initial encounter [facility]) [T85.611A] Patient Active Problem List   Diagnosis Date Noted   Hyperkalemia 03/08/2021   Peritoneal dialysis catheter dysfunction ([place])  ESRD on peritoneal dialysis 03/07/2021   PAF (paroxysmal atrial fibrillation) (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   S/P hip replacement 04/22/2020   Hospital discharge follow-up 02/19/2020   Iron deficiency anemia 02/19/2020  Preop cardiovascular exam 01/09/2020   Anemia in chronic kidney disease 11/15/2019   Benign hypertensive kidney disease with chronic kidney disease 11/15/2019   Chronic kidney disease, stage 3b ([place]) 11/15/2019   Proteinuria 11/15/2019   Hematochezia  05/15/2019   Chronic anticoagulation 05/15/2019   Atrial fibrillation, chronic ([place]) 05/15/2019   Chronic renal disease, stage IV ([place]) 05/15/2019   Unstable angina ([place]) 11/10/2016   S/P cardiac catheterization 09/09/2015   Angina at rest [facility]) 05/01/2015   Hypertensive urgency 06/14/2014   AKI (acute kidney injury) ([place]) 06/14/2014   Benign essential HTN 06/14/2014   Hyperlipidemia 06/14/2014   DM type 2 (diabetes mellitus, type 2) ([place]) 06/14/2014   CAD (coronary artery disease) 06/14/2014   GERD without esophagitis 06/14/2014   Acute kidney failure ([place]) 06/14/2014   Tremor 03/18/2014   Fall at home 08/30/2013   SOB (shortness of breath) on exertion 08/30/2013   Closed head injury 03/24/2013   Unspecified injury of head, initial encounter 03/24/2013   PCP:  [name], MD Pharmacy:   [facility] [place] [address] [facility] [address] [place] [address] Phone: [phone] Fax: [id number], [place] AT [place] [address] Phone: [phone] Fax: [phone]     Social Determinants of Health ([place]) Interventions    Readmission Risk Interventions Readmission Risk Prevention Plan 03/10/2021 04/24/2020  Transportation Screening Complete Complete  PCP or Specialist Appt within 3-5 Days Complete Complete  HRI or [place] - Complete  Social Work Consult for [place] Planning/Counseling Complete Complete  Palliative Care Screening Not Applicable Not Applicable  Medication Review Press photographer) Complete Complete  Some recent data might be hidden

## 2021-03-11 ENCOUNTER — Inpatient Hospital Stay: Payer: Medicare PPO | Attending: Oncology

## 2021-03-11 ENCOUNTER — Other Ambulatory Visit: Payer: Self-pay

## 2021-03-11 ENCOUNTER — Ambulatory Visit
Admission: RE | Admit: 2021-03-11 | Discharge: 2021-03-11 | Disposition: A | Discharge status: Home / Self Care | Payer: Medicare PPO | Source: Ambulatory Visit | Attending: Oncology | Admitting: Oncology

## 2021-03-11 VITALS — BP 148/82 | HR 72 | Temp 96.7°F | Resp 16

## 2021-03-11 DIAGNOSIS — I700 Atherosclerosis of aorta: Secondary | ICD-10-CM | POA: Diagnosis not present

## 2021-03-11 DIAGNOSIS — Z87891 Personal history of nicotine dependence: Secondary | ICD-10-CM | POA: Insufficient documentation

## 2021-03-11 DIAGNOSIS — D631 Anemia in chronic kidney disease: Secondary | ICD-10-CM | POA: Diagnosis present

## 2021-03-11 DIAGNOSIS — R972 Elevated prostate specific antigen [PSA]: Secondary | ICD-10-CM | POA: Insufficient documentation

## 2021-03-11 DIAGNOSIS — N184 Chronic kidney disease, stage 4 (severe): Secondary | ICD-10-CM | POA: Insufficient documentation

## 2021-03-11 DIAGNOSIS — J439 Emphysema, unspecified: Secondary | ICD-10-CM | POA: Diagnosis not present

## 2021-03-11 DIAGNOSIS — K5730 Diverticulosis of large intestine without perforation or abscess without bleeding: Secondary | ICD-10-CM | POA: Insufficient documentation

## 2021-03-11 LAB — BODY FLUID CULTURE W GRAM STAIN
Culture: NO GROWTH
Gram Stain: NONE SEEN

## 2021-03-11 MED ORDER — SODIUM CHLORIDE 0.9 % IV SOLN
INTRAVENOUS | Status: DC
  Filled 2021-03-11: qty 250

## 2021-03-11 MED ORDER — IOHEXOL 300 MG/ML  SOLN
80.0000 mL | Freq: Once | INTRAMUSCULAR | Status: AC | PRN
  Administered 2021-03-11: 80 mL via INTRAVENOUS

## 2021-03-11 MED ORDER — SODIUM CHLORIDE 0.9 % IV SOLN
INTRAVENOUS | Status: DC
  Filled 2021-03-11 (×2): qty 250

## 2021-03-11 NOTE — Progress Notes (Signed)
Pt received 223mL NS prior to CT scan. Will be returning after scan for additional 278mL NS. Saline lock left in place for scan.

## 2021-03-12 ENCOUNTER — Telehealth: Payer: Self-pay | Admitting: *Deleted

## 2021-03-12 NOTE — Telephone Encounter (Signed)
Called report  IMPRESSION: 1. No evidence of primary malignancy or metastatic disease in the chest, abdomen or pelvis. 2. Severe sigmoid colonic diverticulosis with similar chronic sigmoid colonic wall thickening without discrete evidence of acute inflammation although evaluation is limited by streak artifact from left hip arthroplasty. There is gas within the sigmoid mesentery the majority of which can be located within colonic diverticula, but there is also a small volume perihepatic pneumoperitoneum. However, there is a peritoneal dialysis catheter with tip coiled in the pelvis. The above findings are favored to reflect chronic segmental colitis associated with diverticulosis without acute diverticulitis, with the pneumoperitoneum related to the peritoneal dialysis catheter. However, as the sigmoid colon is incompletely evaluated in this area, secondary to streak artifact, suggest clinical correlation for signs/symptoms of diverticulitis and/or perforated hollow viscus. 3. Aortic Atherosclerosis (ICD10-I70.0) and Emphysema (ICD10-J43.9).   These results will be called to the ordering clinician or representative by the Radiologist Assistant, and communication documented in the PACS or [employer].     Electronically Signed   By: Dahlia Bailiff M.D.   On: 03/12/2021 11:43

## 2021-03-13 ENCOUNTER — Inpatient Hospital Stay: Payer: Medicare PPO | Admitting: Oncology

## 2021-03-13 ENCOUNTER — Ambulatory Visit: Payer: Medicare PPO | Admitting: Oncology

## 2021-03-13 ENCOUNTER — Encounter: Payer: Self-pay | Admitting: Oncology

## 2021-03-13 ENCOUNTER — Inpatient Hospital Stay: Payer: Medicare PPO | Attending: Oncology | Admitting: Oncology

## 2021-03-13 DIAGNOSIS — R972 Elevated prostate specific antigen [PSA]: Secondary | ICD-10-CM | POA: Diagnosis not present

## 2021-03-13 DIAGNOSIS — N184 Chronic kidney disease, stage 4 (severe): Secondary | ICD-10-CM

## 2021-03-13 DIAGNOSIS — D631 Anemia in chronic kidney disease: Secondary | ICD-10-CM | POA: Diagnosis not present

## 2021-03-13 NOTE — Progress Notes (Signed)
Patient called/ pre- screened for virtual appoinment today with oncologist. No new concerns  

## 2021-03-14 ENCOUNTER — Encounter: Payer: Self-pay | Admitting: Oncology

## 2021-03-14 NOTE — Progress Notes (Signed)
I connected with Casey French on 03/14/21 at 10:30 AM EST by video enabled telemedicine visit and verified that I am speaking with the correct person using two identifiers.   I discussed the limitations, risks, security and privacy concerns of performing an evaluation and management service by telemedicine and the availability of in-person appointments. I also discussed with the patient that there may be a patient responsible charge related to this service. The patient expressed understanding and agreed to proceed.  Other persons participating in the visit and their role in the encounter:  patients wife  Patient's location:  home Provider's location:  home  Chief Complaint:  discuss ct scan results and further management  Diagnosis: 1.  Anemia of chronic kidney disease 2.  Elevated PSA  History of present illness: Patient is a 71 year old male with a past medical history significant for stage IV CKD hypertension hyperlipidemia type 2 diabetes and atrial fibrillation.  He follows up with Dr. Zollie Scale.  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  Patient did not want to take steroids due to his diabetes.  He is on rituxan for FSGS   He was also referred to [place] for anemia and results of anemia work-up were as follows: White count and platelets were normal and H&H was 7.8/23.9.  Folate levels were low at 5.7, B12 low at 207.  Ferritin levels at 129 and iron studies showed low TIBC of 217 with an iron saturation of 30%.  Myeloma panel did not show any M protein.  Haptoglobin was normal and TSH was normal.     Patient received 2 doses of Venofer as well as B12 injections in June 2022.      Interval history patient is tolerating home peritoneal dialysis well.  Denies any significant abdominal pain or changes in his bowel habits.  Denies any blood in stool.   Review of Systems  Constitutional:  Positive for malaise/fatigue. Negative for  chills, fever and weight loss.  HENT:  Negative for congestion, ear discharge and nosebleeds.   Eyes:  Negative for blurred vision.  Respiratory:  Negative for cough, hemoptysis, sputum production, shortness of breath and wheezing.   Cardiovascular:  Negative for chest pain, palpitations, orthopnea and claudication.  Gastrointestinal:  Negative for abdominal pain, blood in stool, constipation, diarrhea, heartburn, melena, nausea and vomiting.  Genitourinary:  Negative for dysuria, flank pain, frequency, hematuria and urgency.  Musculoskeletal:  Negative for back pain, joint pain and myalgias.  Skin:  Negative for rash.  Neurological:  Negative for dizziness, tingling, focal weakness, seizures, weakness and headaches.  Endo/Heme/Allergies:  Does not bruise/bleed easily.  Psychiatric/Behavioral:  Negative for depression and suicidal ideas. The patient does not have insomnia.    Allergies  Allergen Reactions   Morphine Anxiety    agitation     Past Medical History:  Diagnosis Date   Acute diverticulitis 06/14/2014   Anemia    Anginal pain (HCC)    Aortic atherosclerosis (HCC)    Atrial fibrillation (HCC)    CKD (chronic kidney disease) stage 3, GFR 30-59 ml/min (HCC)    Coronary artery disease    Depression    Diabetes mellitus without complication (HCC)    Diverticulosis    GERD (gastroesophageal reflux disease)    GI bleed 05/15/2019   Gout    Heart murmur    Hematemesis 05/15/2019   High cholesterol    Hypertension    STEMI (ST elevation myocardial infarction) ([place]) 2003  Inferior    Past Surgical History:  Procedure Laterality Date   CAPD INSERTION N/A 08/13/2020   Procedure: LAPAROSCOPIC INSERTION CONTINUOUS AMBULATORY PERITONEAL DIALYSIS  (CAPD) CATHETER;  Surgeon: Ronny Bacon, MD;  Location: [facility];  Service: General;  Laterality: N/A;   CARDIAC CATHETERIZATION     CATARACT EXTRACTION W/PHACO Right 07/05/2019   Procedure: CATARACT EXTRACTION PHACO AND  INTRAOCULAR LENS PLACEMENT ([place]) RIGHT DIABETIC;  Surgeon: Birder Robson, MD;  Location: [facility];  Service: Ophthalmology;  Laterality: Right;  US00:59.3 CDE8.41 LOT [id number] h   COLONOSCOPY WITH PROPOFOL N/A 04/01/2017   Procedure: COLONOSCOPY WITH PROPOFOL;  Surgeon: Lollie Sails, MD;  Location: [facility];  Service: Endoscopy;  Laterality: N/A;   CORONARY ANGIOPLASTY WITH STENT PLACEMENT  10/2001   RCA   CORONARY ANGIOPLASTY WITH STENT PLACEMENT  07/2015   LAD   right hand surgery  2015   3 screws in right hand   TOTAL HIP ARTHROPLASTY Left 04/22/2020   Procedure: TOTAL HIP ARTHROPLASTY ANTERIOR APPROACH;  Surgeon: Hessie Knows, MD;  Location: [facility];  Service: Orthopedics;  Laterality: Left;    Social History   Socioeconomic History   Marital status: Married    Spouse name: Sherri   Number of children: 2   Years of education: Not on file   Highest education level: Not on file  Occupational History   Not on file  Tobacco Use   Smoking status: Former    Types: Cigarettes    Quit date: 03/24/2001    Years since quitting: 19.9   Smokeless tobacco: Never  Vaping Use   Vaping Use: Never used  Substance and Sexual Activity   Alcohol use: No    Alcohol/week: 0.0 standard drinks   Drug use: No   Sexual activity: Not on file  Other Topics Concern   Not on file  Social History Narrative   Not on file   Social Determinants of Health   Financial Resource Strain: Not on file  Food Insecurity: Not on file  Transportation Needs: Not on file  Physical Activity: Not on file  Stress: Not on file  Social Connections: Not on file  Intimate Partner Violence: Not on file    Family History  Problem Relation Age of Onset   Heart disease Mother    Heart disease Father    CAD Other      Current Outpatient Medications:    allopurinol (ZYLOPRIM) 100 MG tablet, Take 100 mg by mouth daily., Disp: , Rfl:    amLODipine (NORVASC) 10 MG tablet, Take 10 mg by mouth daily.,  Disp: , Rfl:    apixaban (ELIQUIS) 2.5 MG TABS tablet, Take 2.5 mg by mouth 2 (two) times daily., Disp: , Rfl:    aspirin 81 MG EC tablet, Take 81 mg by mouth daily., Disp: , Rfl:    calcitRIOL (ROCALTROL) 0.25 MCG capsule, Take 0.25 mcg by mouth daily., Disp: , Rfl:    folic acid (FOLVITE) 1 MG tablet, TAKE 2 TABLETS(2 MG) BY MOUTH DAILY, Disp: 180 tablet, Rfl: 2   gabapentin (NEURONTIN) 300 MG capsule, Take 300 mg by mouth at bedtime., Disp: , Rfl: 11   gentamicin cream (GARAMYCIN) 0.1 %, Apply 1 application topically daily., Disp: 15 g, Rfl: 0   hydrocortisone (ANUSOL-HC) 2.5 % rectal cream, Place 1 application rectally 2 (two) times daily as needed for hemorrhoids or anal itching., Disp: , Rfl:    isosorbide mononitrate (IMDUR) 60 MG 24 hr tablet, Take 60 mg by mouth daily., Disp: ,  Rfl:    losartan (COZAAR) 100 MG tablet, Take 100 mg by mouth daily., Disp: , Rfl:    methocarbamol (ROBAXIN) 500 MG tablet, Take 500 mg by mouth 3 (three) times daily as needed., Disp: , Rfl:    metoprolol succinate (TOPROL-XL) 100 MG 24 hr tablet, Take 150 mg by mouth daily. Take with or immediately following a meal., Disp: , Rfl:    nitroGLYCERIN (NITROSTAT) 0.4 MG SL tablet, Place 0.4 mg under the tongue every 5 (five) minutes as needed for chest pain., Disp: , Rfl:    omeprazole (PRILOSEC) 20 MG capsule, Take 20 mg by mouth daily., Disp: , Rfl:    psyllium (METAMUCIL) 58.6 % packet, Take 1 packet by mouth 2 (two) times daily., Disp: , Rfl:    rosuvastatin (CRESTOR) 40 MG tablet, Take 40 mg by mouth daily., Disp: , Rfl:    Vitamin D, Ergocalciferol, (DRISDOL) 1.25 MG (50000 UNIT) CAPS capsule, Take 50,000 Units by mouth once a week., Disp: , Rfl:   CT CHEST ABDOMEN PELVIS W CONTRAST  Result Date: 03/12/2021 CLINICAL DATA:  Elevated PSA, stage IV CKD. EXAM: CT CHEST, ABDOMEN, AND PELVIS WITH CONTRAST TECHNIQUE: Multidetector CT imaging of the chest, abdomen and pelvis was performed following the standard  protocol during bolus administration of intravenous contrast. RADIATION DOSE REDUCTION: This exam was performed according to the departmental dose-optimization program which includes automated exposure control, adjustment of the mA and/or kV according to patient size and/or use of iterative reconstruction technique. CONTRAST:  22mL OMNIPAQUE IOHEXOL 300 MG/ML  SOLN COMPARISON:  CT February 03, 2020 FINDINGS: CT CHEST FINDINGS Cardiovascular: Aortic and branch vessel atherosclerosis without aneurysmal dilation. No central pulmonary embolus on this nondedicated study. Coronary artery calcifications. Normal size heart. No significant pericardial effusion/thickening. Mediastinum/Nodes: No supraclavicular adenopathy. No discrete thyroid nodule. No pathologically enlarged mediastinal, hilar or axillary lymph nodes. Retained versus refluxed contrast in the esophagus. Lungs/Pleura: Paraseptal emphysema. Mild biapical pleuroparenchymal scarring. No suspicious pulmonary nodule. Linear band like atelectasis in the right middle lobe. No pleural effusion or pneumothorax. Musculoskeletal: Multilevel degenerative changes spine. No aggressive lytic or blastic lesion of bone. CT ABDOMEN PELVIS FINDINGS Hepatobiliary: No suspicious hepatic lesion. Gallbladder is unremarkable. No biliary ductal dilation. Pancreas: No pancreatic ductal dilation or evidence of acute inflammation. Spleen: Normal in size without focal abnormality. Adrenals/Urinary Tract: Bilateral adrenal glands appear normal. No hydronephrosis. Hypodense exophytic right lower pole renal cyst measuring 17 mm. Symmetric bilateral renal enhancement. Evaluation of the urinary bladder is limited by incomplete distension and streak artifact from left hip arthroplasty. Stomach/Bowel: Radiopaque enteric contrast material traverses the descending colon. There is severe sigmoid colonic diverticulosis with similar sigmoid colonic wall thickening likely reflecting chronic segmental  colitis associated with diverticulosis. No significant adjacent inflammatory stranding although evaluation of the sigmoid colon is limited by streak artifact from left hip arthroplasty and there is gas within the sigmoid mesentery. Additionally, there is small volume perihepatic pneumoperitoneum. Vascular/Lymphatic: Aortic and branch vessel atherosclerosis without abdominal aortic aneurysm. No pathologically enlarged abdominal or pelvic lymph nodes. Reproductive: Prostate is unremarkable. Other: No significant abdominopelvic free fluid. Peritoneal dialysis catheter with tip coiled in the pelvis. No walled off fluid collections. Musculoskeletal: Left total hip arthroplasty. No aggressive lytic or blastic lesion of bone. No acute osseous abnormality. IMPRESSION: 1. No evidence of primary malignancy or metastatic disease in the chest, abdomen or pelvis. 2. Severe sigmoid colonic diverticulosis with similar chronic sigmoid colonic wall thickening without discrete evidence of acute inflammation although evaluation is limited  by streak artifact from left hip arthroplasty. There is gas within the sigmoid mesentery the majority of which can be located within colonic diverticula, but there is also a small volume perihepatic pneumoperitoneum. However, there is a peritoneal dialysis catheter with tip coiled in the pelvis. The above findings are favored to reflect chronic segmental colitis associated with diverticulosis without acute diverticulitis, with the pneumoperitoneum related to the peritoneal dialysis catheter. However, as the sigmoid colon is incompletely evaluated in this area, secondary to streak artifact, suggest clinical correlation for signs/symptoms of diverticulitis and/or perforated hollow viscus. 3. Aortic Atherosclerosis (ICD10-I70.0) and Emphysema (ICD10-J43.9). These results will be called to the ordering clinician or representative by the Radiologist Assistant, and communication documented in the PACS or  [employer]. Electronically Signed   By: Dahlia Bailiff M.D.   On: 03/12/2021 11:43    No images are attached to the encounter.   CMP Latest Ref Rng & Units 03/10/2021  Glucose 70 - 99 mg/dL 92  BUN 8 - 23 mg/dL 47(H)  Creatinine 0.61 - 1.24 mg/dL 5.34(H)  Sodium 135 - 145 mmol/L 141  Potassium 3.5 - 5.1 mmol/L 4.9  Chloride 98 - 111 mmol/L 114(H)  CO2 22 - 32 mmol/L 20(L)  Calcium 8.9 - 10.3 mg/dL 8.4(L)  Total Protein 6.5 - 8.1 g/dL -  Total Bilirubin 0.3 - 1.2 mg/dL -  Alkaline Phos 38 - 126 U/L -  AST 15 - 41 U/L -  ALT 0 - 44 U/L -   CBC Latest Ref Rng & Units 03/09/2021  WBC 4.0 - 10.5 K/uL 6.0  Hemoglobin 13.0 - 17.0 g/dL 9.4(L)  Hematocrit 39.0 - 52.0 % 28.6(L)  Platelets 150 - 400 K/uL 157     Observation/objective: Appears in no acute distress over video visit today.  Breathing is unlabored  Assessment and plan: Patient is a 71 year old male with history of end-stage renal disease on peritoneal dialysis here for following issues:  Anemia of chronic kidney disease: When patient was last seen by me November 2022 his hemoglobin was elevated at11.2 and therefore he did not receive any Retacrit at that time.  It is unclear as to why patient did not receive any further doses of Retacrit.  His H&H on 03/09/2021 was 9.4/28.6.  I will therefore resume his Retacrit at this time which she will receive every 3 weeks.  We will check ferritin and iron studies with his next blood draw.  I will see him back in 3 months with CBC ferritin and iron studies.  2 patient is now on renal transplant list and as a part of work-up had a PSA checked which was elevatedIn the [facility] system at 3.05.  He does not follow-up with urology.  Looking back at his prior PSA, PSA was noted to be 3.73 in September 2022 and 1.67 in February 2021.  His recent CT chest abdomen pelvis does not show any evidence of abnormality in his prostate or metastatic disease.  There was small amount of perihepatic  pneumoperitoneum noted which is likely secondary to his peritoneal dialysis catheter.  Patient does not report any new worsening abdominal pain or signs and symptoms of diverticulitis.  I will refer him to urology for his elevated PSA  Follow-up instructions: H&H and Retacrit every 3 weeks.  See me in 3 months with labs  I discussed the assessment and treatment plan with the patient. The patient was provided an opportunity to ask questions and all were answered. The patient agreed with the plan  and demonstrated an understanding of the instructions.   The patient was advised to call back or seek an in-person evaluation if the symptoms worsen or if the condition fails to improve as anticipated.   Visit Diagnosis: 1. Anemia in stage 4 chronic kidney disease (HCC)   2. Elevated PSA     Dr. Randa Evens, MD, MPH [place] [facility] Tel- [phone] 03/14/2021 10:25 AM

## 2021-03-17 ENCOUNTER — Inpatient Hospital Stay: Payer: Medicare PPO

## 2021-03-24 ENCOUNTER — Other Ambulatory Visit: Payer: Medicare PPO

## 2021-03-24 ENCOUNTER — Ambulatory Visit: Payer: Medicare PPO

## 2021-03-26 ENCOUNTER — Other Ambulatory Visit: Payer: Self-pay | Admitting: Oncology

## 2021-03-27 ENCOUNTER — Encounter: Payer: Self-pay | Admitting: Oncology

## 2021-03-31 ENCOUNTER — Other Ambulatory Visit: Payer: Self-pay | Admitting: *Deleted

## 2021-03-31 DIAGNOSIS — D631 Anemia in chronic kidney disease: Secondary | ICD-10-CM

## 2021-03-31 DIAGNOSIS — N184 Chronic kidney disease, stage 4 (severe): Secondary | ICD-10-CM

## 2021-04-07 ENCOUNTER — Inpatient Hospital Stay: Payer: Medicare PPO

## 2021-04-08 ENCOUNTER — Ambulatory Visit (INDEPENDENT_AMBULATORY_CARE_PROVIDER_SITE_OTHER): Payer: Medicare PPO | Admitting: Urology

## 2021-04-08 ENCOUNTER — Inpatient Hospital Stay: Payer: Medicare PPO | Attending: Oncology

## 2021-04-08 ENCOUNTER — Encounter: Payer: Self-pay | Admitting: Urology

## 2021-04-08 ENCOUNTER — Inpatient Hospital Stay: Payer: Medicare PPO

## 2021-04-08 ENCOUNTER — Other Ambulatory Visit: Payer: Self-pay

## 2021-04-08 VITALS — BP 174/88 | HR 79 | Ht 69.0 in | Wt 148.9 lb

## 2021-04-08 DIAGNOSIS — D631 Anemia in chronic kidney disease: Secondary | ICD-10-CM | POA: Diagnosis present

## 2021-04-08 DIAGNOSIS — N400 Enlarged prostate without lower urinary tract symptoms: Secondary | ICD-10-CM

## 2021-04-08 DIAGNOSIS — R972 Elevated prostate specific antigen [PSA]: Secondary | ICD-10-CM | POA: Diagnosis not present

## 2021-04-08 DIAGNOSIS — N184 Chronic kidney disease, stage 4 (severe): Secondary | ICD-10-CM | POA: Insufficient documentation

## 2021-04-08 DIAGNOSIS — N051 Unspecified nephritic syndrome with focal and segmental glomerular lesions: Secondary | ICD-10-CM

## 2021-04-08 LAB — CBC WITH DIFFERENTIAL/PLATELET
Abs Immature Granulocytes: 0.05 10*3/uL (ref 0.00–0.07)
Basophils Absolute: 0 10*3/uL (ref 0.0–0.1)
Basophils Relative: 1 %
Eosinophils Absolute: 0.1 10*3/uL (ref 0.0–0.5)
Eosinophils Relative: 2 %
HCT: 33.7 % — ABNORMAL LOW (ref 39.0–52.0)
Hemoglobin: 10.6 g/dL — ABNORMAL LOW (ref 13.0–17.0)
Immature Granulocytes: 1 %
Lymphocytes Relative: 11 %
Lymphs Abs: 0.5 10*3/uL — ABNORMAL LOW (ref 0.7–4.0)
MCH: 32.2 pg (ref 26.0–34.0)
MCHC: 31.5 g/dL (ref 30.0–36.0)
MCV: 102.4 fL — ABNORMAL HIGH (ref 80.0–100.0)
Monocytes Absolute: 0.5 10*3/uL (ref 0.1–1.0)
Monocytes Relative: 10 %
Neutro Abs: 3.7 10*3/uL (ref 1.7–7.7)
Neutrophils Relative %: 75 %
Platelets: 184 10*3/uL (ref 150–400)
RBC: 3.29 MIL/uL — ABNORMAL LOW (ref 4.22–5.81)
RDW: 16.1 % — ABNORMAL HIGH (ref 11.5–15.5)
WBC: 4.9 10*3/uL (ref 4.0–10.5)
nRBC: 0 % (ref 0.0–0.2)

## 2021-04-08 LAB — COMPREHENSIVE METABOLIC PANEL
ALT: 15 U/L (ref 0–44)
AST: 16 U/L (ref 15–41)
Albumin: 2.9 g/dL — ABNORMAL LOW (ref 3.5–5.0)
Alkaline Phosphatase: 99 U/L (ref 38–126)
Anion gap: 6 (ref 5–15)
BUN: 50 mg/dL — ABNORMAL HIGH (ref 8–23)
CO2: 20 mmol/L — ABNORMAL LOW (ref 22–32)
Calcium: 8.3 mg/dL — ABNORMAL LOW (ref 8.9–10.3)
Chloride: 112 mmol/L — ABNORMAL HIGH (ref 98–111)
Creatinine, Ser: 5.81 mg/dL — ABNORMAL HIGH (ref 0.61–1.24)
GFR, Estimated: 10 mL/min — ABNORMAL LOW (ref >60)
Glucose, Bld: 94 mg/dL (ref 70–99)
Potassium: 5 mmol/L (ref 3.5–5.1)
Sodium: 138 mmol/L (ref 135–145)
Total Bilirubin: 0.4 mg/dL (ref 0.3–1.2)
Total Protein: 6.3 g/dL — ABNORMAL LOW (ref 6.5–8.1)

## 2021-04-08 LAB — IRON AND TIBC
Iron: 77 ug/dL (ref 45–182)
Saturation Ratios: 29 % (ref 17.9–39.5)
TIBC: 265 ug/dL (ref 250–450)
UIBC: 188 ug/dL

## 2021-04-08 NOTE — Progress Notes (Signed)
? ?04/08/2021 ?1:45 PM  ? ?Casey French ?Apr 24, 1950 ?[id number] ? ?Referring provider: Sindy Guadeloupe, MD ?[place] [address] ? ?Chief Complaint  ?Patient presents with  ? Elevated PSA  ? ? ?HPI: ?Casey French is a 71 y.o. male referred for evaluation of an elevated PSA.  He presents today with his spouse. ? ?Referred for PSA 3.05 ?Prior PSA 10/24/2020 2.73 and has fluctuated since 2016 as below ?No bothersome LUTS ?Denies dysuria, gross hematuria, recurrent UTI ?Denies flank, abdominal or pelvic pain ? ? ? ?PMH: ?Past Medical History:  ?Diagnosis Date  ? Acute diverticulitis 06/14/2014  ? Anemia   ? Anginal pain ([place])   ? Aortic atherosclerosis ([place])   ? Atrial fibrillation ([place])   ? CKD (chronic kidney disease) stage 3, GFR 30-59 ml/min (HCC)   ? Coronary artery disease   ? Depression   ? Diabetes mellitus without complication ([place])   ? Diverticulosis   ? GERD (gastroesophageal reflux disease)   ? GI bleed 05/15/2019  ? Gout   ? Heart murmur   ? Hematemesis 05/15/2019  ? High cholesterol   ? Hypertension   ? STEMI (ST elevation myocardial infarction) ([place]) 2003  ? Inferior  ? ? ?Surgical History: ?Past Surgical History:  ?Procedure Laterality Date  ? CAPD INSERTION N/A 08/13/2020  ? Procedure: LAPAROSCOPIC INSERTION CONTINUOUS AMBULATORY PERITONEAL DIALYSIS  (CAPD) CATHETER;  Surgeon: Ronny Bacon, MD;  Location: [facility];  Service: General;  Laterality: N/A;  ? CARDIAC CATHETERIZATION    ? CATARACT EXTRACTION W/PHACO Right 07/05/2019  ? Procedure: CATARACT EXTRACTION PHACO AND INTRAOCULAR LENS PLACEMENT ([place]) RIGHT DIABETIC;  Surgeon: Birder Robson, MD;  Location: [facility];  Service: Ophthalmology;  Laterality: Right;  US00:59.3 ?CDE8.41 ?LOT [id number] h  ? COLONOSCOPY WITH PROPOFOL N/A 04/01/2017  ? Procedure: COLONOSCOPY WITH PROPOFOL;  Surgeon: Lollie Sails, MD;  Location: [facility];  Service: Endoscopy;  Laterality: N/A;  ? CORONARY ANGIOPLASTY WITH STENT  PLACEMENT  10/2001  ? RCA  ? CORONARY ANGIOPLASTY WITH STENT PLACEMENT  07/2015  ? LAD  ? right hand surgery  2015  ? 3 screws in right hand  ? TOTAL HIP ARTHROPLASTY Left 04/22/2020  ? Procedure: TOTAL HIP ARTHROPLASTY ANTERIOR APPROACH;  Surgeon: Hessie Knows, MD;  Location: [facility];  Service: Orthopedics;  Laterality: Left;  ? ? ?Home Medications:  ?Allergies as of 04/08/2021   ? ?   Reactions  ? Morphine Anxiety  ? agitation  ? ?  ? ?  ?Medication List  ?  ? ?  ? Accurate as of April 08, 2021  1:45 PM. If you have any questions, ask your nurse or doctor.  ?  ?  ? ?  ? ?allopurinol 100 MG tablet ?Commonly known as: ZYLOPRIM ?Take 100 mg by mouth daily. ?  ?amLODipine 5 MG tablet ?Commonly known as: NORVASC ?Take by mouth. ?What changed: Another medication with the same name was removed. Continue taking this medication, and follow the directions you see here. ?Changed by: Abbie Sons, MD ?  ?apixaban 2.5 MG Tabs tablet ?Commonly known as: ELIQUIS ?Take 2.5 mg by mouth 2 (two) times daily. ?  ?aspirin 81 MG EC tablet ?Take 81 mg by mouth daily. ?  ?calcitRIOL 0.25 MCG capsule ?Commonly known as: ROCALTROL ?Take 0.25 mcg by mouth daily. ?  ?folic acid 1 MG tablet ?Commonly known as: FOLVITE ?TAKE 2 TABLETS(2 MG) BY MOUTH DAILY ?  ?gabapentin 300 MG capsule ?Commonly known as: NEURONTIN ?  Take 300 mg by mouth at bedtime. ?  ?gentamicin cream 0.1 % ?Commonly known as: GARAMYCIN ?Apply 1 application topically daily. ?  ?hydrocortisone 2.5 % rectal cream ?Commonly known as: ANUSOL-HC ?Place 1 application rectally 2 (two) times daily as needed for hemorrhoids or anal itching. ?  ?isosorbide mononitrate 60 MG 24 hr tablet ?Commonly known as: IMDUR ?Take 60 mg by mouth daily. ?  ?losartan 100 MG tablet ?Commonly known as: COZAAR ?Take 100 mg by mouth daily. ?  ?methocarbamol 500 MG tablet ?Commonly known as: ROBAXIN ?Take 500 mg by mouth 3 (three) times daily as needed. ?  ?metoprolol succinate 100 MG 24 hr  tablet ?Commonly known as: TOPROL-XL ?Take 150 mg by mouth daily. Take with or immediately following a meal. ?  ?nitroGLYCERIN 0.4 MG SL tablet ?Commonly known as: NITROSTAT ?Place 0.4 mg under the tongue every 5 (five) minutes as needed for chest pain. ?  ?omeprazole 20 MG capsule ?Commonly known as: PRILOSEC ?Take 20 mg by mouth daily. ?  ?psyllium 58.6 % packet ?Commonly known as: METAMUCIL ?Take 1 packet by mouth 2 (two) times daily. ?  ?rosuvastatin 40 MG tablet ?Commonly known as: CRESTOR ?Take 40 mg by mouth daily. ?  ?Vitamin D (Ergocalciferol) 1.25 MG (50000 UNIT) Caps capsule ?Commonly known as: DRISDOL ?Take 50,000 Units by mouth once a week. ?  ? ?  ? ? ?Allergies:  ?Allergies  ?Allergen Reactions  ? Morphine Anxiety  ?  agitation ?  ? ? ?Family History: ?Family History  ?Problem Relation Age of Onset  ? Heart disease Mother   ? Heart disease Father   ? CAD Other   ? ? ?Social History:  reports that he quit smoking about 20 years ago. His smoking use included cigarettes. He has never used smokeless tobacco. He reports that he does not drink alcohol and does not use drugs. ? ? ?Physical Exam: ?BP (!) 174/88   Pulse 79   Ht 5\' 9"  (1.753 m)   Wt 148 lb 14.4 oz (67.5 kg)   BMI 21.99 kg/m?   ?Constitutional:  Alert, no acute distress. ?HEENT: [place] AT, moist mucus membranes.  Trachea midline, no masses. ?Cardiovascular: No clubbing, cyanosis, or edema. ?Respiratory: Normal respiratory effort, no increased work of breathing. ?GI: Abdomen is soft, nontender, nondistended, no abdominal masses ?GU: Prostate 35 g, smooth without nodules ? ? ?Assessment & Plan:   ?We discussed there are varying reference ranges for PSA results and the [facility] system has a lower reference range.  Discussed there are age specific guidelines and for a patient in his 34s < 6.5 is considered normal. ?Looking at his PSA trend I do not think a PSA of 3.05 is abnormally elevated ?Recommend lab visit 6 months for a follow-up PSA.  Will  contact patient for further recommendations at that time ? ? ?Abbie Sons, MD ? ?[place] ?[address] ?[place], [place] [address] ?[phone] ? ?

## 2021-04-28 ENCOUNTER — Inpatient Hospital Stay: Payer: Medicare PPO | Attending: Oncology

## 2021-04-28 ENCOUNTER — Inpatient Hospital Stay: Payer: Medicare PPO

## 2021-05-13 ENCOUNTER — Encounter: Payer: Self-pay | Admitting: Oncology

## 2021-05-19 ENCOUNTER — Inpatient Hospital Stay: Payer: Medicare PPO

## 2021-06-09 ENCOUNTER — Inpatient Hospital Stay: Payer: Medicare PPO | Attending: Oncology

## 2021-06-09 ENCOUNTER — Inpatient Hospital Stay: Payer: Medicare PPO

## 2021-06-30 ENCOUNTER — Inpatient Hospital Stay: Payer: Medicare PPO

## 2021-06-30 ENCOUNTER — Inpatient Hospital Stay: Payer: Medicare PPO | Attending: Oncology

## 2021-07-21 ENCOUNTER — Inpatient Hospital Stay: Payer: Medicare PPO | Admitting: Oncology

## 2021-07-21 ENCOUNTER — Inpatient Hospital Stay: Payer: Medicare PPO

## 2021-10-08 ENCOUNTER — Other Ambulatory Visit: Payer: Self-pay | Admitting: *Deleted

## 2021-10-08 DIAGNOSIS — N400 Enlarged prostate without lower urinary tract symptoms: Secondary | ICD-10-CM

## 2021-10-09 ENCOUNTER — Other Ambulatory Visit: Payer: Medicare PPO

## 2021-10-09 ENCOUNTER — Encounter: Payer: Self-pay | Admitting: Urology

## 2021-11-08 ENCOUNTER — Telehealth: Payer: Self-pay | Admitting: Urology

## 2021-11-08 NOTE — Telephone Encounter (Signed)
Patient was due for a follow-up PSA September 2023 and was a no-show for his lab visit.  Recommend rescheduling

## 2021-11-09 NOTE — Telephone Encounter (Signed)
Notified patient as instructed, patient pleased. Discussed follow-up appointments, patient agrees  

## 2021-11-11 ENCOUNTER — Other Ambulatory Visit: Payer: Medicare PPO

## 2021-11-11 DIAGNOSIS — N400 Enlarged prostate without lower urinary tract symptoms: Secondary | ICD-10-CM

## 2021-11-12 ENCOUNTER — Telehealth: Payer: Self-pay | Admitting: *Deleted

## 2021-11-12 LAB — PSA: Prostate Specific Ag, Serum: 2.9 ng/mL (ref 0.0–4.0)

## 2021-11-12 NOTE — Telephone Encounter (Signed)
-----   Message from Abbie Sons, MD sent at 11/12/2021  7:49 AM EDT ----- Repeat PSA is normal at 2.9.  Recommend 1 year follow-up with PSA.

## 2021-11-13 NOTE — Telephone Encounter (Signed)
Notified patient as instructed, patient pleased. Discussed follow-up appointments, patient agrees  

## 2021-12-15 ENCOUNTER — Encounter: Payer: Self-pay | Admitting: Oncology

## 2022-03-26 DIAGNOSIS — K5732 Diverticulitis of large intestine without perforation or abscess without bleeding: Secondary | ICD-10-CM

## 2022-03-26 HISTORY — DX: Diverticulitis of large intestine without perforation or abscess without bleeding: K57.32

## 2022-04-18 ENCOUNTER — Inpatient Hospital Stay
Admission: EM | Admit: 2022-04-18 | Discharge: 2022-04-24 | DRG: 391 | Disposition: A | Discharge status: Home / Self Care | Payer: Medicare PPO | Attending: Internal Medicine | Admitting: Internal Medicine

## 2022-04-18 ENCOUNTER — Emergency Department: Payer: Medicare PPO

## 2022-04-18 ENCOUNTER — Other Ambulatory Visit: Payer: Self-pay

## 2022-04-18 DIAGNOSIS — Z87891 Personal history of nicotine dependence: Secondary | ICD-10-CM

## 2022-04-18 DIAGNOSIS — I4819 Other persistent atrial fibrillation: Secondary | ICD-10-CM | POA: Diagnosis present

## 2022-04-18 DIAGNOSIS — R7881 Bacteremia: Secondary | ICD-10-CM | POA: Diagnosis not present

## 2022-04-18 DIAGNOSIS — I120 Hypertensive chronic kidney disease with stage 5 chronic kidney disease or end stage renal disease: Secondary | ICD-10-CM | POA: Diagnosis present

## 2022-04-18 DIAGNOSIS — E876 Hypokalemia: Secondary | ICD-10-CM | POA: Diagnosis present

## 2022-04-18 DIAGNOSIS — I482 Chronic atrial fibrillation, unspecified: Secondary | ICD-10-CM | POA: Diagnosis present

## 2022-04-18 DIAGNOSIS — E872 Acidosis, unspecified: Secondary | ICD-10-CM | POA: Diagnosis present

## 2022-04-18 DIAGNOSIS — Z885 Allergy status to narcotic agent status: Secondary | ICD-10-CM | POA: Diagnosis not present

## 2022-04-18 DIAGNOSIS — Z1152 Encounter for screening for COVID-19: Secondary | ICD-10-CM

## 2022-04-18 DIAGNOSIS — N186 End stage renal disease: Secondary | ICD-10-CM | POA: Diagnosis present

## 2022-04-18 DIAGNOSIS — E119 Type 2 diabetes mellitus without complications: Secondary | ICD-10-CM

## 2022-04-18 DIAGNOSIS — R55 Syncope and collapse: Secondary | ICD-10-CM | POA: Diagnosis present

## 2022-04-18 DIAGNOSIS — D631 Anemia in chronic kidney disease: Secondary | ICD-10-CM | POA: Diagnosis present

## 2022-04-18 DIAGNOSIS — E7800 Pure hypercholesterolemia, unspecified: Secondary | ICD-10-CM | POA: Diagnosis present

## 2022-04-18 DIAGNOSIS — K219 Gastro-esophageal reflux disease without esophagitis: Secondary | ICD-10-CM | POA: Diagnosis present

## 2022-04-18 DIAGNOSIS — E1122 Type 2 diabetes mellitus with diabetic chronic kidney disease: Secondary | ICD-10-CM | POA: Diagnosis present

## 2022-04-18 DIAGNOSIS — K5732 Diverticulitis of large intestine without perforation or abscess without bleeding: Secondary | ICD-10-CM | POA: Diagnosis present

## 2022-04-18 DIAGNOSIS — E860 Dehydration: Secondary | ICD-10-CM | POA: Diagnosis present

## 2022-04-18 DIAGNOSIS — B957 Other staphylococcus as the cause of diseases classified elsewhere: Secondary | ICD-10-CM

## 2022-04-18 DIAGNOSIS — K5792 Diverticulitis of intestine, part unspecified, without perforation or abscess without bleeding: Principal | ICD-10-CM | POA: Diagnosis present

## 2022-04-18 DIAGNOSIS — Z8249 Family history of ischemic heart disease and other diseases of the circulatory system: Secondary | ICD-10-CM

## 2022-04-18 DIAGNOSIS — N184 Chronic kidney disease, stage 4 (severe): Secondary | ICD-10-CM | POA: Diagnosis present

## 2022-04-18 DIAGNOSIS — I160 Hypertensive urgency: Secondary | ICD-10-CM | POA: Diagnosis present

## 2022-04-18 DIAGNOSIS — I2510 Atherosclerotic heart disease of native coronary artery without angina pectoris: Secondary | ICD-10-CM | POA: Diagnosis present

## 2022-04-18 DIAGNOSIS — Z7901 Long term (current) use of anticoagulants: Secondary | ICD-10-CM | POA: Diagnosis not present

## 2022-04-18 DIAGNOSIS — I252 Old myocardial infarction: Secondary | ICD-10-CM

## 2022-04-18 DIAGNOSIS — Z992 Dependence on renal dialysis: Secondary | ICD-10-CM | POA: Diagnosis not present

## 2022-04-18 DIAGNOSIS — N2581 Secondary hyperparathyroidism of renal origin: Secondary | ICD-10-CM | POA: Diagnosis present

## 2022-04-18 DIAGNOSIS — N179 Acute kidney failure, unspecified: Secondary | ICD-10-CM | POA: Diagnosis present

## 2022-04-18 DIAGNOSIS — Z7982 Long term (current) use of aspirin: Secondary | ICD-10-CM

## 2022-04-18 DIAGNOSIS — Z79899 Other long term (current) drug therapy: Secondary | ICD-10-CM

## 2022-04-18 DIAGNOSIS — R7989 Other specified abnormal findings of blood chemistry: Secondary | ICD-10-CM | POA: Diagnosis present

## 2022-04-18 DIAGNOSIS — I10 Essential (primary) hypertension: Secondary | ICD-10-CM | POA: Diagnosis present

## 2022-04-18 DIAGNOSIS — M109 Gout, unspecified: Secondary | ICD-10-CM | POA: Diagnosis present

## 2022-04-18 DIAGNOSIS — Z96642 Presence of left artificial hip joint: Secondary | ICD-10-CM | POA: Diagnosis present

## 2022-04-18 DIAGNOSIS — E785 Hyperlipidemia, unspecified: Secondary | ICD-10-CM | POA: Diagnosis present

## 2022-04-18 DIAGNOSIS — Z955 Presence of coronary angioplasty implant and graft: Secondary | ICD-10-CM | POA: Diagnosis not present

## 2022-04-18 LAB — TROPONIN I (HIGH SENSITIVITY)
Troponin I (High Sensitivity): 28 ng/L — ABNORMAL HIGH (ref <18)
Troponin I (High Sensitivity): 28 ng/L — ABNORMAL HIGH (ref <18)

## 2022-04-18 LAB — COMPREHENSIVE METABOLIC PANEL
ALT: 14 U/L (ref 0–44)
AST: 26 U/L (ref 15–41)
Albumin: 3 g/dL — ABNORMAL LOW (ref 3.5–5.0)
Alkaline Phosphatase: 124 U/L (ref 38–126)
Anion gap: 11 (ref 5–15)
BUN: 45 mg/dL — ABNORMAL HIGH (ref 8–23)
CO2: 16 mmol/L — ABNORMAL LOW (ref 22–32)
Calcium: 7.8 mg/dL — ABNORMAL LOW (ref 8.9–10.3)
Chloride: 108 mmol/L (ref 98–111)
Creatinine, Ser: 5.28 mg/dL — ABNORMAL HIGH (ref 0.61–1.24)
GFR, Estimated: 11 mL/min — ABNORMAL LOW (ref >60)
Glucose, Bld: 120 mg/dL — ABNORMAL HIGH (ref 70–99)
Potassium: 3.3 mmol/L — ABNORMAL LOW (ref 3.5–5.1)
Sodium: 135 mmol/L (ref 135–145)
Total Bilirubin: 1.1 mg/dL (ref 0.3–1.2)
Total Protein: 6.8 g/dL (ref 6.5–8.1)

## 2022-04-18 LAB — URINALYSIS, W/ REFLEX TO CULTURE (INFECTION SUSPECTED)
Bacteria, UA: NONE SEEN
Bilirubin Urine: NEGATIVE
Glucose, UA: 150 mg/dL — AB
Ketones, ur: 20 mg/dL — AB
Leukocytes,Ua: NEGATIVE
Nitrite: NEGATIVE
Protein, ur: >=300 mg/dL — AB
Specific Gravity, Urine: 1.015 (ref 1.005–1.030)
Squamous Epithelial / HPF: NONE SEEN /HPF (ref 0–5)
pH: 6 (ref 5.0–8.0)

## 2022-04-18 LAB — RESP PANEL BY RT-PCR (RSV, FLU A&B, COVID)  RVPGX2
Influenza A by PCR: NEGATIVE
Influenza B by PCR: NEGATIVE
Resp Syncytial Virus by PCR: NEGATIVE
SARS Coronavirus 2 by RT PCR: NEGATIVE

## 2022-04-18 LAB — MAGNESIUM: Magnesium: 1.9 mg/dL (ref 1.7–2.4)

## 2022-04-18 LAB — CBC
HCT: 37 % — ABNORMAL LOW (ref 39.0–52.0)
Hemoglobin: 12.3 g/dL — ABNORMAL LOW (ref 13.0–17.0)
MCH: 31.7 pg (ref 26.0–34.0)
MCHC: 33.2 g/dL (ref 30.0–36.0)
MCV: 95.4 fL (ref 80.0–100.0)
Platelets: 288 10*3/uL (ref 150–400)
RBC: 3.88 MIL/uL — ABNORMAL LOW (ref 4.22–5.81)
RDW: 12.6 % (ref 11.5–15.5)
WBC: 9.2 10*3/uL (ref 4.0–10.5)
nRBC: 0 % (ref 0.0–0.2)

## 2022-04-18 LAB — LIPASE, BLOOD: Lipase: 63 U/L — ABNORMAL HIGH (ref 11–51)

## 2022-04-18 LAB — LACTIC ACID, PLASMA
Lactic Acid, Venous: 2.4 mmol/L (ref 0.5–1.9)
Lactic Acid, Venous: 1.5 mmol/L (ref 0.5–1.9)

## 2022-04-18 MED ORDER — ONDANSETRON HCL 4 MG/2ML IJ SOLN
4.0000 mg | Freq: Once | INTRAMUSCULAR | Status: AC
  Administered 2022-04-18: 4 mg via INTRAVENOUS
  Filled 2022-04-18: qty 2

## 2022-04-18 MED ORDER — PANTOPRAZOLE SODIUM 40 MG PO TBEC
40.0000 mg | DELAYED_RELEASE_TABLET | Freq: Every day | ORAL | Status: DC
  Administered 2022-04-19 – 2022-04-24 (×6): 40 mg via ORAL
  Filled 2022-04-18 (×6): qty 1

## 2022-04-18 MED ORDER — PSYLLIUM 95 % PO PACK
1.0000 | PACK | Freq: Two times a day (BID) | ORAL | Status: DC
  Administered 2022-04-19 – 2022-04-24 (×11): 1 via ORAL
  Filled 2022-04-18 (×11): qty 1

## 2022-04-18 MED ORDER — ISOSORBIDE MONONITRATE ER 30 MG PO TB24
60.0000 mg | ORAL_TABLET | Freq: Every day | ORAL | Status: DC
  Administered 2022-04-19 – 2022-04-24 (×6): 60 mg via ORAL
  Filled 2022-04-18 (×2): qty 2
  Filled 2022-04-18: qty 1
  Filled 2022-04-18 (×3): qty 2

## 2022-04-18 MED ORDER — ONDANSETRON HCL 4 MG PO TABS: 4.0000 mg | ORAL_TABLET | Freq: Four times a day (QID) | ORAL | Status: DC | PRN

## 2022-04-18 MED ORDER — ONDANSETRON HCL 4 MG/2ML IJ SOLN
4.0000 mg | Freq: Four times a day (QID) | INTRAMUSCULAR | Status: DC | PRN
  Administered 2022-04-19 – 2022-04-22 (×6): 4 mg via INTRAVENOUS
  Filled 2022-04-18 (×6): qty 2

## 2022-04-18 MED ORDER — MORPHINE SULFATE (PF) 2 MG/ML IV SOLN
2.0000 mg | INTRAVENOUS | Status: DC | PRN
  Administered 2022-04-19 – 2022-04-24 (×6): 2 mg via INTRAVENOUS
  Filled 2022-04-18 (×6): qty 1

## 2022-04-18 MED ORDER — INSULIN ASPART 100 UNIT/ML IJ SOLN: 0.0000 [IU] | Freq: Every day | INTRAMUSCULAR | Status: DC

## 2022-04-18 MED ORDER — ROSUVASTATIN CALCIUM 10 MG PO TABS
40.0000 mg | ORAL_TABLET | Freq: Every day | ORAL | Status: DC
  Administered 2022-04-19 – 2022-04-24 (×6): 40 mg via ORAL
  Filled 2022-04-18 (×4): qty 4
  Filled 2022-04-18: qty 2
  Filled 2022-04-18: qty 4

## 2022-04-18 MED ORDER — GABAPENTIN 300 MG PO CAPS
300.0000 mg | ORAL_CAPSULE | Freq: Every day | ORAL | Status: DC
  Administered 2022-04-19 – 2022-04-22 (×5): 300 mg via ORAL
  Filled 2022-04-18 (×5): qty 1

## 2022-04-18 MED ORDER — MORPHINE SULFATE (PF) 4 MG/ML IV SOLN
4.0000 mg | Freq: Once | INTRAVENOUS | Status: AC
  Administered 2022-04-18: 4 mg via INTRAVENOUS
  Filled 2022-04-18: qty 1

## 2022-04-18 MED ORDER — APIXABAN 2.5 MG PO TABS
2.5000 mg | ORAL_TABLET | Freq: Two times a day (BID) | ORAL | Status: DC
  Administered 2022-04-19 – 2022-04-24 (×12): 2.5 mg via ORAL
  Filled 2022-04-18 (×12): qty 1

## 2022-04-18 MED ORDER — SODIUM CHLORIDE 0.9 % IV SOLN
2.0000 g | INTRAVENOUS | Status: DC
  Administered 2022-04-19 – 2022-04-20 (×2): 2 g via INTRAVENOUS
  Filled 2022-04-18: qty 2
  Filled 2022-04-18: qty 20

## 2022-04-18 MED ORDER — METRONIDAZOLE 500 MG/100ML IV SOLN
500.0000 mg | Freq: Two times a day (BID) | INTRAVENOUS | Status: DC
  Administered 2022-04-19 – 2022-04-24 (×11): 500 mg via INTRAVENOUS
  Filled 2022-04-18 (×11): qty 100

## 2022-04-18 MED ORDER — SODIUM CHLORIDE 0.9 % IV SOLN
1.0000 g | Freq: Once | INTRAVENOUS | Status: AC
  Administered 2022-04-18: 1 g via INTRAVENOUS
  Filled 2022-04-18: qty 10

## 2022-04-18 MED ORDER — METOPROLOL SUCCINATE ER 100 MG PO TB24
100.0000 mg | ORAL_TABLET | Freq: Every day | ORAL | Status: DC
  Administered 2022-04-19 – 2022-04-24 (×6): 100 mg via ORAL
  Filled 2022-04-18: qty 2
  Filled 2022-04-18 (×5): qty 1

## 2022-04-18 MED ORDER — METRONIDAZOLE 500 MG/100ML IV SOLN
500.0000 mg | Freq: Once | INTRAVENOUS | Status: AC
  Administered 2022-04-18: 500 mg via INTRAVENOUS
  Filled 2022-04-18: qty 100

## 2022-04-18 MED ORDER — NITROGLYCERIN 0.4 MG SL SUBL: 0.4000 mg | SUBLINGUAL_TABLET | SUBLINGUAL | Status: DC | PRN

## 2022-04-18 MED ORDER — INSULIN ASPART 100 UNIT/ML IJ SOLN
0.0000 [IU] | Freq: Three times a day (TID) | INTRAMUSCULAR | Status: DC
  Administered 2022-04-20 – 2022-04-22 (×2): 2 [IU] via SUBCUTANEOUS
  Filled 2022-04-18 (×3): qty 1

## 2022-04-18 MED ORDER — SODIUM CHLORIDE 0.9 % IV BOLUS
500.0000 mL | Freq: Once | INTRAVENOUS | Status: AC
  Administered 2022-04-18: 500 mL via INTRAVENOUS

## 2022-04-18 MED ORDER — LACTATED RINGERS IV SOLN: INTRAVENOUS | Status: DC

## 2022-04-18 NOTE — ED Provider Notes (Signed)
[facility] Provider Note    Event Date/Time   First MD Initiated Contact with Patient 04/18/22 1815     (approximate)   History   Emesis and Abdominal Pain   HPI  Casey French is a 72 y.o. male   Past medical history of peritoneal dialysis, atrial fibrillation on Eliquis, GI bleeding, CAD, CKD, diabetes, hypertension hyperlipidemia here w 5 days of generalized weakness, fatigue, nausea, abdominal pain, chest pain, HA, shortness of breath.  Also had subjective fevers and chills.  Given his feeling unwell over the past 5 days he has been consistently doing his peritoneal dialysis and he has been inconsistent with taking his home medications.  He denies any further respiratory infectious symptoms like cough, congestion, sore throat.  He does have nonradiating nonexertional chest discomfort as well as shortness of breath.  He denies diarrhea or blood in the stool.  He denies urinary symptoms.   External Medical Documents Reviewed: Discharge summary from 03/10/2021 for peritoneal dialysis catheter dysfunction      Physical Exam   Triage Vital Signs: ED Triage Vitals [04/18/22 1804]  Enc Vitals Group     BP      Pulse      Resp      Temp      Temp src      SpO2      Weight 145 lb (65.8 kg)     Height 5\' 9"  (1.753 m)     Head Circumference      Peak Flow      Pain Score 10     Pain Loc      Pain Edu?      Excl. in [place]?     Most recent vital signs: Vitals:   04/18/22 1838 04/18/22 1930  BP: (!) 187/98 (!) 191/99  Pulse: 83 69  Resp: 20 (!) 34  Temp: 98.5 F (36.9 C)   SpO2: 100% 100%    General: Awake, medically ill-appearing, tired appearing CV:  Is slightly dehydrated with dry mucous membrane Resp:  Normal effort.  Abd:  No distention.  Other:  Abdomen is soft but he does have some tenderness to palpation diffusely.  No rigidity or guarding.  He is hypertensive 190s over 90 and afebrile.  His lungs are clear without focality or  wheezing   ED Results / Procedures / Treatments   Labs (all labs ordered are listed, but only abnormal results are displayed) Labs Reviewed  LIPASE, BLOOD - Abnormal; Notable for the following components:      Result Value   Lipase 63 (*)    All other components within normal limits  COMPREHENSIVE METABOLIC PANEL - Abnormal; Notable for the following components:   Potassium 3.3 (*)    CO2 16 (*)    Glucose, Bld 120 (*)    BUN 45 (*)    Creatinine, Ser 5.28 (*)    Calcium 7.8 (*)    Albumin 3.0 (*)    GFR, Estimated 11 (*)    All other components within normal limits  CBC - Abnormal; Notable for the following components:   RBC 3.88 (*)    Hemoglobin 12.3 (*)    HCT 37.0 (*)    All other components within normal limits  LACTIC ACID, PLASMA - Abnormal; Notable for the following components:   Lactic Acid, Venous 2.4 (*)    All other components within normal limits  URINALYSIS, W/ REFLEX TO CULTURE (INFECTION SUSPECTED) - Abnormal; Notable for the  following components:   Color, Urine YELLOW (*)    APPearance CLEAR (*)    Glucose, UA 150 (*)    Hgb urine dipstick MODERATE (*)    Ketones, ur 20 (*)    Protein, ur >=300 (*)    All other components within normal limits  TROPONIN I (HIGH SENSITIVITY) - Abnormal; Notable for the following components:   Troponin I (High Sensitivity) 28 (*)    All other components within normal limits  RESP PANEL BY RT-PCR (RSV, FLU A&B, COVID)  [id number]  CULTURE, BLOOD (ROUTINE X 2)  CULTURE, BLOOD (ROUTINE X 2)  MAGNESIUM  LACTIC ACID, PLASMA  TROPONIN I (HIGH SENSITIVITY)     I ordered and reviewed the above labs they are notable for white blood cell count is normal.  Lactic acidosis in the low twos  EKG  ED ECG REPORT I, Lucillie Garfinkel, the attending physician, personally viewed and interpreted this ECG.   Date: 04/18/2022  EKG Time: 1811  Rate: 90  Rhythm: nsr  Axis: nl  Intervals:none  ST&T Change: No acute ischemic  changes    RADIOLOGY I independently reviewed and interpreted CT of the head see no obvious bleeding or midline shift   PROCEDURES:  Critical Care performed: No  Procedures   MEDICATIONS ORDERED IN ED: Medications  morphine (PF) 4 MG/ML injection 4 mg (has no administration in time range)  cefTRIAXone (ROCEPHIN) 1 g in sodium chloride 0.9 % 100 mL IVPB (has no administration in time range)  metroNIDAZOLE (FLAGYL) IVPB 500 mg (has no administration in time range)  sodium chloride 0.9 % bolus 500 mL (has no administration in time range)  ondansetron (ZOFRAN) injection 4 mg (4 mg Intravenous Given 04/18/22 1853)    External physician / consultants:  I spoke with hospitalist for admission regarding care plan for this patient.   IMPRESSION / MDM / ASSESSMENT AND PLAN / ED COURSE  I reviewed the triage vital signs and the nursing notes.                                Patient's presentation is most consistent with acute presentation with potential threat to life or bodily function.  Differential diagnosis includes, but is not limited to, sepsis, intra-abdominal infection, colitis, appendicitis, diverticulitis, respiratory infection with viral URI, bacterial pneumonia, urinary tract infection, SBP   The patient is on the cardiac monitor to evaluate for evidence of arrhythmia and/or significant heart rate changes.  MDM: This is a patient with multiple complaints but generally feeling unwell with a multitude of symptoms including pain in the chest, abdomen, nausea, subjective fevers and chills and found to have uncomplicated diverticulitis on CT scan.  Has a elevated lactic as well.  Given his renal disease with peritoneal dialysis I will hydrate him gentle fluids to be careful not to overload him.  Fortunately he is not tachycardic and his blood pressure is high, will order 500 cc as well as intra-abdominal infection IV antibiotics ceftriaxone Flagyl.  Given his multiple medical  comorbidities lactic acidosis I will admit him         FINAL CLINICAL IMPRESSION(S) / ED DIAGNOSES   Final diagnoses:  Diverticulitis     Rx / DC Orders   ED Discharge Orders     None        Note:  This document was prepared using Dragon voice recognition software and may include unintentional dictation errors.  Lucillie Garfinkel, MD 04/18/22 2038

## 2022-04-18 NOTE — ED Triage Notes (Addendum)
Pt to ED from home via [facility] for multiple complaints including N/V/D, chest pain, abdominal pain, etc. Pt has had poor intake at home per wife. Pt is CAOx4. Vomiting in triage.  12lead NS 4mg  Zofran 200/110 BGL 178 20 LAC 500 NS  Pt had witness near syncope in triage room with this RN. Pt does have abdominal dialysis port. Pt does home dialysis. Pt has not has his treatment in the last 4 days due to feeling sick.

## 2022-04-18 NOTE — H&P (Signed)
History and Physical    Patient: Casey French [id number] DOB: 02-Jul-1950 DOA: 04/18/2022 DOS: the patient was seen and examined on 04/18/2022 PCP: [name], MD  Patient coming from: Home  Chief Complaint:  Chief Complaint  Patient presents with   Emesis   Abdominal Pain   HPI: Casey French is a 72 y.o. male with medical history significant of coronary artery disease, diabetes, GERD, history of GI bleed, essential hypertension, hyperlipidemia, gout, depression, persistent atrial fibrillation on chronic anticoagulation who was brought in from home with nausea vomiting diarrhea as well as abdominal pain.  Patient also complained of chest pain and pain all over.  He also has some near syncope.  He is on home peritoneal dialysis apparently.  Patient came to the ER where his blood pressure was 200/110.  Wife at bedside.  Workup initiated with no obvious fever or chills.  His white count is normal.  Hemoglobin 12.3.  Patient has creatinine of 5.28 again on peritoneal dialysis.  Potassium 3.3.  Also elevated troponin.  Imaging showed acute uncomplicated sigmoid diverticulitis.  Patient is being admitted to the hospital for further evaluation and treatment. . Review of Systems: As mentioned in the history of present illness. All other systems reviewed and are negative. Past Medical History:  Diagnosis Date   Acute diverticulitis 06/14/2014   Anemia    Anginal pain (HCC)    Aortic atherosclerosis (HCC)    Atrial fibrillation (HCC)    CKD (chronic kidney disease) stage 3, GFR 30-59 ml/min (HCC)    Coronary artery disease    Depression    Diabetes mellitus without complication (HCC)    Diverticulosis    GERD (gastroesophageal reflux disease)    GI bleed 05/15/2019   Gout    Heart murmur    Hematemesis 05/15/2019   High cholesterol    Hypertension    STEMI (ST elevation myocardial infarction) ([place]) 2003   Inferior   Past Surgical History:  Procedure Laterality Date    CAPD INSERTION N/A 08/13/2020   Procedure: LAPAROSCOPIC INSERTION CONTINUOUS AMBULATORY PERITONEAL DIALYSIS  (CAPD) CATHETER;  Surgeon: Ronny Bacon, MD;  Location: [facility] ORS;  Service: General;  Laterality: N/A;   CARDIAC CATHETERIZATION     CATARACT EXTRACTION W/PHACO Right 07/05/2019   Procedure: CATARACT EXTRACTION PHACO AND INTRAOCULAR LENS PLACEMENT ([place]) RIGHT DIABETIC;  Surgeon: Birder Robson, MD;  Location: [facility] ORS;  Service: Ophthalmology;  Laterality: Right;  US00:59.3 CDE8.41 LOT [id number]   COLONOSCOPY WITH PROPOFOL N/A 04/01/2017   Procedure: COLONOSCOPY WITH PROPOFOL;  Surgeon: Lollie Sails, MD;  Location[facility] ENDOSCOPY;  Service: Endoscopy;  Laterality: N/A;   CORONARY ANGIOPLASTY WITH STENT PLACEMENT  10/2001   RCA   CORONARY ANGIOPLASTY WITH STENT PLACEMENT  07/2015   LAD   right hand surgery  2015   3 screws in right hand   TOTAL HIP ARTHROPLASTY Left 04/22/2020   Procedure: TOTAL HIP ARTHROPLASTY ANTERIOR APPROACH;  Surgeon: Hessie Knows, MD;  Location: [facility] ORS;  Service: Orthopedics;  Laterality: Left;   Social History:  reports that he quit smoking about 21 years ago. His smoking use included cigarettes. He has never used smokeless tobacco. He reports that he does not drink alcohol and does not use drugs.  Allergies  Allergen Reactions   Morphine Anxiety    agitation     Family History  Problem Relation Age of Onset   Heart disease Mother    Heart disease Father    CAD Other  Prior to Admission medications   Medication Sig Start Date End Date Taking? Authorizing Provider  allopurinol (ZYLOPRIM) 100 MG tablet Take 100 mg by mouth daily.    [provider]  amLODipine (NORVASC) 5 MG tablet Take by mouth. 04/03/21   [provider]  apixaban (ELIQUIS) 2.5 MG TABS tablet Take 2.5 mg by mouth 2 (two) times daily.    [provider]  aspirin 81 MG EC tablet Take 81 mg by mouth daily.    [provider]  folic  acid (FOLVITE) 1 MG tablet TAKE 2 TABLETS(2 MG) BY MOUTH DAILY 03/27/21   Sindy Guadeloupe, MD  gabapentin (NEURONTIN) 300 MG capsule Take 300 mg by mouth at bedtime. 09/25/16   [provider]  gentamicin cream (GARAMYCIN) 0.1 % Apply 1 application topically daily. 03/11/21   Ezekiel Slocumb, DO  hydrocortisone (ANUSOL-HC) 2.5 % rectal cream Place 1 application rectally 2 (two) times daily as needed for hemorrhoids or anal itching.    [provider]  isosorbide mononitrate (IMDUR) 60 MG 24 hr tablet Take 60 mg by mouth daily.    [provider]  losartan (COZAAR) 100 MG tablet Take 100 mg by mouth daily. 04/28/20   [provider]  methocarbamol (ROBAXIN) 500 MG tablet Take 500 mg by mouth 3 (three) times daily as needed. 11/13/20   [provider]  metoprolol succinate (TOPROL-XL) 100 MG 24 hr tablet Take 150 mg by mouth daily. Take with or immediately following a meal.    [provider]  nitroGLYCERIN (NITROSTAT) 0.4 MG SL tablet Place 0.4 mg under the tongue every 5 (five) minutes as needed for chest pain.    [provider]  omeprazole (PRILOSEC) 20 MG capsule Take 20 mg by mouth daily.    [provider]  psyllium (METAMUCIL) 58.6 % packet Take 1 packet by mouth 2 (two) times daily.    [provider]  rosuvastatin (CRESTOR) 40 MG tablet Take 40 mg by mouth daily. 03/06/19   [provider]  Vitamin D, Ergocalciferol, (DRISDOL) 1.25 MG (50000 UNIT) CAPS capsule Take 50,000 Units by mouth once a week. 10/23/20   [provider]    Physical Exam: Vitals:   04/18/22 1804 04/18/22 1838 04/18/22 1930 04/18/22 2035  BP:  (!) 187/98 (!) 191/99 (!) 186/94  Pulse:  83 69 81  Resp:  20 (!) 34 (!) 24  Temp:  98.5 F (36.9 C)  97.9 F (36.6 C)  TempSrc:  Oral  Oral  SpO2:  100% 100% 100%  Weight: 65.8 kg     Height: 5\' 9"  (1.753 m)      Constitutional: Acutely ill looking, anxious no distress NAD, calm,  comfortable Eyes: PERRL, lids and conjunctivae normal ENMT: Mucous membranes are moist. Posterior pharynx clear of any exudate or lesions.Normal dentition.  Neck: normal, supple, no masses, no thyromegaly Respiratory: clear to auscultation bilaterally, no wheezing, no crackles. Normal respiratory effort. No accessory muscle use.  Cardiovascular: Regular rate and rhythm, no murmurs / rubs / gallops. No extremity edema. 2+ pedal pulses. No carotid bruits.  Abdomen: Mild left lower quadrant tenderness no masses palpated. No hepatosplenomegaly. Bowel sounds positive.  Musculoskeletal: Good range of motion, no joint swelling or tenderness, Skin: no rashes, lesions, ulcers. No induration Neurologic: CN 2-12 grossly intact. Sensation intact, DTR normal. Strength 5/5 in all 4.  Psychiatric: Normal judgment and insight. Alert and oriented x 3. Normal mood  Data Reviewed:  White count 9.2 hemoglobin  12.3 and platelets 288.  Sodium 135 potassium 3.3 chloride 108 CO2 16 BUN 45.  Creatinine is 5.2 and calcium 7.8.  Lactic acid 2.4.  Magnesium 1.9.  Troponin 28.  Acute viral screen is negative.  Urinalysis essentially negative.  Chest x-ray showed no active disease.  CT head without contrast is negative.  CT chest abdomen showed right basilar atelectasis changes.  Also mild diverticulitis in the sigmoid colon no perforation or abscess.  Assessment and Plan:  #1 acute uncomplicated sigmoid diverticulitis: Patient with multiple comorbidities.  Will admit the patient for IV antibiotics.  Pain management and nausea management.  If and when diverticulitis got better transition to oral therapy when he is able to take without problem.  In the meantime keep on clear liquid diet.  #2 end-stage renal disease on peritoneal dialysis: Consult nephrology for dialysis in the hospital.  #3 hypokalemia: Replete per dialysis protocol.  #4 lactic acidosis: Most likely as a result of infection.  Also possible dehydration.   Hydrate and monitor lactate level.  #5 diabetes: Initiate sliding scale insulin.  #6 GERD: Continue PPIs.  #7 hypertensive urgency: Systolic of [id number].  Coming down now.  Use IV medicines.  IV hydralazine or labetalol.  #8 hyperlipidemia: Resume statin  #9 coronary artery disease: No acute decompensation.   Advance Care Planning:   Code Status: Full Code   Consults: None but GI and nephrology consult in the morning  Family Communication: Wife  Severity of Illness: The appropriate patient status for this patient is INPATIENT. Inpatient status is judged to be reasonable and necessary in order to provide the required intensity of service to ensure the patient's safety. The patient's presenting symptoms, physical exam findings, and initial radiographic and laboratory data in the context of their chronic comorbidities is felt to place them at high risk for further clinical deterioration. Furthermore, it is not anticipated that the patient will be medically stable for discharge from the hospital within 2 midnights of admission.   * I certify that at the point of admission it is my clinical judgment that the patient will require inpatient hospital care spanning beyond 2 midnights from the point of admission due to high intensity of service, high risk for further deterioration and high frequency of surveillance required.*  AuthorBarbette Merino, MD 04/18/2022 8:54 PM  For on call review [email or web address].

## 2022-04-19 ENCOUNTER — Encounter: Payer: Self-pay | Admitting: Internal Medicine

## 2022-04-19 DIAGNOSIS — K5792 Diverticulitis of intestine, part unspecified, without perforation or abscess without bleeding: Secondary | ICD-10-CM | POA: Diagnosis not present

## 2022-04-19 LAB — CBG MONITORING, ED
Glucose-Capillary: 101 mg/dL — ABNORMAL HIGH (ref 70–99)
Glucose-Capillary: 124 mg/dL — ABNORMAL HIGH (ref 70–99)
Glucose-Capillary: 99 mg/dL (ref 70–99)

## 2022-04-19 LAB — CBC
HCT: 31.9 % — ABNORMAL LOW (ref 39.0–52.0)
Hemoglobin: 10.6 g/dL — ABNORMAL LOW (ref 13.0–17.0)
MCH: 31.9 pg (ref 26.0–34.0)
MCHC: 33.2 g/dL (ref 30.0–36.0)
MCV: 96.1 fL (ref 80.0–100.0)
Platelets: 240 10*3/uL (ref 150–400)
RBC: 3.32 MIL/uL — ABNORMAL LOW (ref 4.22–5.81)
RDW: 12.7 % (ref 11.5–15.5)
WBC: 9.4 10*3/uL (ref 4.0–10.5)
nRBC: 0 % (ref 0.0–0.2)

## 2022-04-19 LAB — BLOOD CULTURE ID PANEL (REFLEXED) - BCID2

## 2022-04-19 LAB — COMPREHENSIVE METABOLIC PANEL
ALT: 12 U/L (ref 0–44)
AST: 18 U/L (ref 15–41)
Albumin: 2.6 g/dL — ABNORMAL LOW (ref 3.5–5.0)
Alkaline Phosphatase: 103 U/L (ref 38–126)
Anion gap: 15 (ref 5–15)
BUN: 44 mg/dL — ABNORMAL HIGH (ref 8–23)
CO2: 21 mmol/L — ABNORMAL LOW (ref 22–32)
Calcium: 8.5 mg/dL — ABNORMAL LOW (ref 8.9–10.3)
Chloride: 107 mmol/L (ref 98–111)
Creatinine, Ser: 5.58 mg/dL — ABNORMAL HIGH (ref 0.61–1.24)
GFR, Estimated: 10 mL/min — ABNORMAL LOW (ref >60)
Glucose, Bld: 102 mg/dL — ABNORMAL HIGH (ref 70–99)
Potassium: 3.9 mmol/L (ref 3.5–5.1)
Sodium: 140 mmol/L (ref 135–145)
Total Bilirubin: 0.4 mg/dL (ref 0.3–1.2)
Total Protein: 5.9 g/dL — ABNORMAL LOW (ref 6.5–8.1)

## 2022-04-19 LAB — BODY FLUID CELL COUNT WITH DIFFERENTIAL
Eos, Fluid: 0 %
Lymphs, Fluid: 17 %
Monocyte-Macrophage-Serous Fluid: 49 %
Neutrophil Count, Fluid: 34 %
Total Nucleated Cell Count, Fluid: 248 cu mm

## 2022-04-19 LAB — HEPATITIS B SURFACE ANTIGEN: Hepatitis B Surface Ag: NONREACTIVE

## 2022-04-19 LAB — GLUCOSE, CAPILLARY
Glucose-Capillary: 114 mg/dL — ABNORMAL HIGH (ref 70–99)
Glucose-Capillary: 148 mg/dL — ABNORMAL HIGH (ref 70–99)

## 2022-04-19 MED ORDER — DELFLEX-LC/1.5% DEXTROSE 344 MOSM/L IP SOLN
INTRAPERITONEAL | Status: DC
  Administered 2022-04-19: 1000 mL via INTRAPERITONEAL

## 2022-04-19 MED ORDER — GENTAMICIN SULFATE 0.1 % EX CREA
1.0000 | TOPICAL_CREAM | Freq: Every day | CUTANEOUS | Status: DC
  Administered 2022-04-20 – 2022-04-23 (×5): 1 via TOPICAL
  Filled 2022-04-19: qty 15

## 2022-04-19 MED ORDER — VANCOMYCIN HCL 1500 MG/300ML IV SOLN
1500.0000 mg | Freq: Once | INTRAVENOUS | Status: AC
  Administered 2022-04-19: 1500 mg via INTRAVENOUS
  Filled 2022-04-19: qty 300

## 2022-04-19 MED ORDER — VANCOMYCIN VARIABLE DOSE PER UNSTABLE RENAL FUNCTION (PHARMACIST DOSING): Status: DC

## 2022-04-19 NOTE — Progress Notes (Signed)
Peritoneal Dialysis Treatment Initiation Note   Pre Treatment Weight: 61.6 kg  Consent signed and in chart.  PD treatment initiated via aseptic technique.   Patient is awake and alert. No complaints of pain.   PD exit site clean, dry and intact.   PD samples taken and sent to lab.  Hand-off given to the patient's nurse.  Education provided to dept staff  regarding PD machine and how  to contact tech support if machine  alarms.    [place] Constable RN [facility]

## 2022-04-19 NOTE — Progress Notes (Signed)
PHARMACY - PHYSICIAN COMMUNICATION CRITICAL VALUE ALERT - BLOOD CULTURE IDENTIFICATION (BCID)  Casey French is an 72 y.o. male who presented to [facility] on 04/18/2022 with a chief complaint of N/V/D.   Assessment:  Blood cultures from 3/24 with GPC in 1 of 4 bottles, BCID detects Staphylococcus species (NOT S. Aureus or epidermidis).  On antibiotics for possible diverticulitis.   Name of physician (or Provider) Contacted: Dr Rande Brunt  Current antibiotics: Ceftriaxone/metronidazole  Changes to prescribed antibiotics recommended:  Recommendations accepted by provider - monitor on current therapy as this blood culture represents likely contaminant  Results for orders placed or performed during the hospital encounter of 04/18/22  Blood Culture ID Panel (Reflexed) (Collected: 04/18/2022  6:40 PM)  Result Value Ref Range   Enterococcus faecalis NOT DETECTED NOT DETECTED   Enterococcus Faecium NOT DETECTED NOT DETECTED   Listeria monocytogenes NOT DETECTED NOT DETECTED   Staphylococcus species DETECTED (A) NOT DETECTED   Staphylococcus aureus (BCID) NOT DETECTED NOT DETECTED   Staphylococcus epidermidis NOT DETECTED NOT DETECTED   Staphylococcus lugdunensis NOT DETECTED NOT DETECTED   Streptococcus species NOT DETECTED NOT DETECTED   Streptococcus agalactiae NOT DETECTED NOT DETECTED   Streptococcus pneumoniae NOT DETECTED NOT DETECTED   Streptococcus pyogenes NOT DETECTED NOT DETECTED   A.calcoaceticus-baumannii NOT DETECTED NOT DETECTED   Bacteroides fragilis NOT DETECTED NOT DETECTED   Enterobacterales NOT DETECTED NOT DETECTED   Enterobacter cloacae complex NOT DETECTED NOT DETECTED   Escherichia coli NOT DETECTED NOT DETECTED   Klebsiella aerogenes NOT DETECTED NOT DETECTED   Klebsiella oxytoca NOT DETECTED NOT DETECTED   Klebsiella pneumoniae NOT DETECTED NOT DETECTED   Proteus species NOT DETECTED NOT DETECTED   Salmonella species NOT DETECTED NOT DETECTED   Serratia  marcescens NOT DETECTED NOT DETECTED   Haemophilus influenzae NOT DETECTED NOT DETECTED   Neisseria meningitidis NOT DETECTED NOT DETECTED   Pseudomonas aeruginosa NOT DETECTED NOT DETECTED   Stenotrophomonas maltophilia NOT DETECTED NOT DETECTED   Candida albicans NOT DETECTED NOT DETECTED   Candida auris NOT DETECTED NOT DETECTED   Candida glabrata NOT DETECTED NOT DETECTED   Candida krusei NOT DETECTED NOT DETECTED   Candida parapsilosis NOT DETECTED NOT DETECTED   Candida tropicalis NOT DETECTED NOT DETECTED   Cryptococcus neoformans/gattii NOT DETECTED NOT DETECTED    Doreene Eland, PharmD, BCPS, BCIDP Work Cell: [phone] 04/19/2022 9:29 AM

## 2022-04-19 NOTE — ED Notes (Signed)
This RN, spoke with Gala Romney, MD., concerning the pt's peritoneal dialysis schedule while admitted to the hospital. Per the provider a consult to nephrology has been placed and they will follow-up with the pt concerning dialysis. Close monitoring continued.

## 2022-04-19 NOTE — Progress Notes (Signed)
Pharmacy Antibiotic Note  Casey French is a 72 y.o. male admitted on 04/18/2022 with possible bacteremia.  Patient presented with N/V/D and abdominal pain.  CT revealed mild diveriticulitis and patient started on ceftriaxone and metronidazole.  Pharmacy has been consulted for vancomycin dosing for possible bacteremia (staphylococcus species).  Today, 04/19/2022 Day 1 ceftriaxone/metronidazole Renal: ESRD on PD WBC WNL Afebrile 3/24 blood cultures initially 1/4 bottles GPC, but now 3/4 bottles with GPC, BCID detects Staphylococcus species (not S aureus or S epidermidis) Note both sets drawn at same time, ?? Drawn from same site (despite documentation saying from two different sites) Nephrology ordering peritoneal fluid analysis and culture - ? Concerns for peritonitis  Plan: Vancomycin 1500mg  IV x 1 today after repeat blood cultures collected Expect to check random vancomycin level with morning labs Wednesday at earliest  Follow-up culture results  Height: 5\' 9"  (175.3 cm) Weight: 65.8 kg (145 lb) IBW/kg (Calculated) : 70.7  Temp (24hrs), Avg:98.2 F (36.8 C), Min:97.9 F (36.6 C), Max:98.5 F (36.9 C)  Recent Labs  Lab 04/18/22 1809 04/18/22 1840 04/18/22 2030 04/19/22 0506  WBC 9.2  --   --  9.4  CREATININE 5.28*  --   --  5.58*  LATICACIDVEN  --  2.4* 1.5  --     Estimated Creatinine Clearance: 11.3 mL/min (A) (by C-G formula based on SCr of 5.58 mg/dL (H)).    Allergies  Allergen Reactions   Morphine Anxiety    agitation     Antimicrobials this admission: 3/24 ceftriaxone >> 3/24 metronidazole >> 3/25 vancomycin >>  Dose adjustments this admission:   Microbiology results: 3/24 BCx: 3/4 bottles with GPC- BCID staph species 3/25 bcx 3/25 peritoneal fluid:  Thank you for allowing pharmacy to be a part of this patient's care.  Doreene Eland, PharmD, BCPS, BCIDP Work Cell: [phone] 04/19/2022 1:56 PM

## 2022-04-19 NOTE — ED Notes (Signed)
Pt with ~262mLs emesis at this time, medicated per [facility]

## 2022-04-19 NOTE — Progress Notes (Signed)
  Progress Note   Patient: Casey French [id number] DOB: 1950-06-04 DOA: 04/18/2022     1 DOS: the patient was seen and examined on 04/19/2022    Subjective:  Patient seen and examined at bedside this morning He admits to improvement in his abdominal pain Denies nausea vomiting chest pain or cough   Brief hospital course: DADE KRUPNICK is a 72 y.o. male with medical history significant of coronary artery disease, diabetes, GERD, history of GI bleed, essential hypertension, hyperlipidemia, gout, depression, persistent atrial fibrillation on chronic anticoagulation who was brought in from home with nausea vomiting diarrhea as well as abdominal pain. Patient came to the ER where his blood pressure was 200/110.   Imaging showed acute uncomplicated sigmoid diverticulitis.     Assessment and Plan:  #1 acute uncomplicated sigmoid diverticulitis: Patient with multiple comorbidities.   Continue current antibiotic therapy Continue pain management and nausea management.   Advancement of diet as tolerated  #2 end-stage renal disease on peritoneal dialysis:  Consult ed nephrology for dialysis needs   #3 hypokalemia: Continue repletion and monitoring   # Syncopal episode likely secondary to above Treatment as above   #4 lactic acidosis: Most likely as a result of infection.  Also possible dehydration.  Hydrate and monitor lactate level.   #5 diabetes:  Continue sliding scale insulin.   #6 GERD: Continue PPIs.   #7 hypertensive urgency: Systolic of [id number] on presentation.   Blood pressure improved  Continue IV hydralazine or labetalol.   #8 hyperlipidemia: Continue statin   #9 coronary artery disease: No acute decompensation.    Advance Care Planning:   Code Status: Full Code    Consults: Nephrology was consulted   Family Communication: Wife    Physical Exam: Constitutional: Elderly male lying in bed, in no acute distress Eyes: PERRL, lids and conjunctivae  normal ENMT: Mucous membranes are moist.  Neck: normal, supple, no masses, no thyromegaly Respiratory: clear to auscultation bilaterally,  Cardiovascular: Regular rate and rhythm,  Abdomen: Mild left lower quadrant tenderness no masses palpated. Peritoneal dialysis catheter in place Musculoskeletal: Good range of motion, no joint swelling or tenderness, Skin: no rashes, lesions, ulcers. No induration Neurologic: CN 2-12 grossly intact.   Vitals:   04/19/22 1400 04/19/22 1415 04/19/22 1520 04/19/22 1546  BP: 109/67   125/82  Pulse: (!) 59 (!) 59  63  Resp: 17 17  16   Temp:   98 F (36.7 C) (!) 96.6 F (35.9 C)  TempSrc:   Oral Axillary  SpO2: 98% 98%  99%  Weight:      Height:        Data Reviewed: CBC and BMP  reviewed by me today   Family Communication: No family member at bedside  Disposition: Status is: Inpatient Team continues to meet inpatient criteria as he is currently requiring IV antibiotics   Planned Discharge Destination: Prior living condition  Time spent: 40 minutes  Author: Verline Lema, MD 04/19/2022 5:43 PM  For on call review [email or web address].

## 2022-04-19 NOTE — Progress Notes (Signed)
[facility]  ROUNDING NOTE   Subjective:   Casey French is a 72 year old male with past medical conditions including GERD, diabetes, CAD, hypertension, gout, hyperlipidemia, atrial fibrillation, and end-stage renal disease on peritoneal dialysis.  Patient presents to the emergency department with complaints of nausea, vomiting, diarrhea, and abdominal pain.  Patient has been admitted for Acute diverticulitis [K57.92]  Patient is known to our practice and is followed by [facility], Dr. Juleen China for peritoneal dialysis.  Patient reports he missed 2-3 treatments last week due to not feeling well.  Patient reports receiving treatment on Saturday night.  He states his abdominal pain got worse after completing treatment Sunday morning.  States he has had nausea, vomiting, and some diarrhea.  Abdominal pain this is main concern.  Labs on ED arrival acceptable for renal patient.  Lactic acid 2.4. CT Head/Abd/Pelvis shows mild diverticulitis.  We have been consulted to continue peritoneal dialysis treatment during this admission.   Objective:  Vital signs in last 24 hours:  Temp:  [97.9 F (36.6 C)-98.5 F (36.9 C)] 98.1 F (36.7 C) (03/25 1028) Pulse Rate:  [63-91] 63 (03/25 1136) Resp:  [16-34] 19 (03/25 1136) BP: (133-191)/(76-99) 173/87 (03/25 1133) SpO2:  [93 %-100 %] 93 % (03/25 1136) Weight:  [65.8 kg] 65.8 kg (03/24 1804)  Weight change:  Filed Weights   04/18/22 1804  Weight: 65.8 kg    Intake/Output: I/O last 3 completed shifts: In: 100 [IV Piggyback:100] Out: -    Intake/Output this shift:  Total I/O In: 834.3 [I.V.:834.3] Out: 100 [Emesis/NG output:100]  Physical Exam: General: NAD  Head: Normocephalic, atraumatic. Dry oral mucosal membranes  Eyes: Anicteric  Lungs:  Clear to auscultation, normal effort  Heart: Regular rate and rhythm  Abdomen:  Soft, nontender. [place]  Extremities:  No peripheral edema.  Neurologic: Alert and oriented,  moving all four extremities  Skin: No lesions  Access: [facility]    Basic Metabolic Panel: Recent Labs  Lab 04/18/22 1809 04/18/22 1825 04/19/22 0506  NA 135  --  140  K 3.3*  --  3.9  CL 108  --  107  CO2 16*  --  21*  GLUCOSE 120*  --  102*  BUN 45*  --  44*  CREATININE 5.28*  --  5.58*  CALCIUM 7.8*  --  8.5*  MG  --  1.9  --     Liver Function Tests: Recent Labs  Lab 04/18/22 1809 04/19/22 0506  AST 26 18  ALT 14 12  ALKPHOS 124 103  BILITOT 1.1 0.4  PROT 6.8 5.9*  ALBUMIN 3.0* 2.6*   Recent Labs  Lab 04/18/22 1809  LIPASE 63*   No results for input(s): "AMMONIA" in the last 168 hours.  CBC: Recent Labs  Lab 04/18/22 1809 04/19/22 0506  WBC 9.2 9.4  HGB 12.3* 10.6*  HCT 37.0* 31.9*  MCV 95.4 96.1  PLT 288 240    Cardiac Enzymes: No results for input(s): "CKTOTAL", "CKMB", "CKMBINDEX", "TROPONINI" in the last 168 hours.  BNP: Invalid input(s): "POCBNP"  CBG: Recent Labs  Lab 04/19/22 0004 04/19/22 0905 04/19/22 1131  GLUCAP 101* 99 124*    Microbiology: Results for orders placed or performed during the hospital encounter of 04/18/22  Resp panel by RT-PCR (RSV, Flu A&B, Covid) Anterior Nasal Swab     Status: None   Collection Time: 04/18/22  6:14 PM   Specimen: Anterior Nasal Swab  Result Value Ref Range Status   SARS Coronavirus 2  by RT PCR NEGATIVE NEGATIVE Final    Comment: (NOTE) SARS-CoV-2 target nucleic acids are NOT DETECTED.  The SARS-CoV-2 RNA is generally detectable in upper respiratory specimens during the acute phase of infection. The lowest concentration of SARS-CoV-2 viral copies this assay can detect is 138 copies/mL. A negative result does not preclude SARS-Cov-2 infection and should not be used as the sole basis for treatment or other patient management decisions. A negative result may occur with  improper specimen collection/handling, submission of specimen other than nasopharyngeal swab, presence of viral mutation(s)  within the areas targeted by this assay, and inadequate number of viral copies(<138 copies/mL). A negative result must be combined with clinical observations, patient history, and epidemiological information. The expected result is Negative.  Fact Sheet for Patients:  EntrepreneurPulse.com.au  Fact Sheet for Healthcare Providers:  IncredibleEmployment.be  This test is no t yet approved or cleared by the [place] FDA and  has been authorized for detection and/or diagnosis of SARS-CoV-2 by FDA under an Emergency Use Authorization (EUA). This EUA will remain  in effect (meaning this test can be used) for the duration of the COVID-19 declaration under Section 564(b)(1) of the Act, 21 U.S.C.section 360bbb-3(b)(1), unless the authorization is terminated  or revoked sooner.       Influenza A by PCR NEGATIVE NEGATIVE Final   Influenza B by PCR NEGATIVE NEGATIVE Final    Comment: (NOTE) The Xpert Xpress SARS-CoV-2/FLU/RSV plus assay is intended as an aid in the diagnosis of influenza from Nasopharyngeal swab specimens and should not be used as a sole basis for treatment. Nasal washings and aspirates are unacceptable for Xpert Xpress SARS-CoV-2/FLU/RSV testing.  Fact Sheet for Patients: EntrepreneurPulse.com.au  Fact Sheet for Healthcare Providers: IncredibleEmployment.be  This test is not yet approved or cleared by the [place] FDA and has been authorized for detection and/or diagnosis of SARS-CoV-2 by FDA under an Emergency Use Authorization (EUA). This EUA will remain in effect (meaning this test can be used) for the duration of the COVID-19 declaration under Section 564(b)(1) of the Act, 21 U.S.C. section 360bbb-3(b)(1), unless the authorization is terminated or revoked.     Resp Syncytial Virus by PCR NEGATIVE NEGATIVE Final    Comment: (NOTE) Fact Sheet for  Patients: EntrepreneurPulse.com.au  Fact Sheet for Healthcare Providers: IncredibleEmployment.be  This test is not yet approved or cleared by the [place] FDA and has been authorized for detection and/or diagnosis of SARS-CoV-2 by FDA under an Emergency Use Authorization (EUA). This EUA will remain in effect (meaning this test can be used) for the duration of the COVID-19 declaration under Section 564(b)(1) of the Act, 21 U.S.C. section 360bbb-3(b)(1), unless the authorization is terminated or revoked.  Performed at [facility], [address] [place],  [address]   Blood Culture (routine x 2)     Status: None (Preliminary result)   Collection Time: 04/18/22  6:40 PM   Specimen: BLOOD  Result Value Ref Range Status   Specimen Description BLOOD BLOOD RIGHT HAND  Final   Special Requests   Final    BOTTLES DRAWN AEROBIC AND ANAEROBIC Blood Culture adequate volume   Culture  Setup Time   Final    GRAM POSITIVE COCCI IN BOTH AEROBIC AND ANAEROBIC BOTTLES Organism ID to follow CRITICAL RESULT CALLED TO, READ BACK BY AND VERIFIED WITH: C/DEVON [facility] 04/19/22 0924 SLM Performed at [facility], [address], [place],  [address]    Culture [place]  Final  Report Status PENDING  Incomplete  Blood Culture (routine x 2)     Status: None (Preliminary result)   Collection Time: 04/18/22  6:40 PM   Specimen: BLOOD  Result Value Ref Range Status   Specimen Description BLOOD BLOOD LEFT ARM  Final   Special Requests   Final    BOTTLES DRAWN AEROBIC AND ANAEROBIC Blood Culture adequate volume   Culture  Setup Time   Final    GRAM POSITIVE COCCI AEROBIC BOTTLE ONLY CRITICAL VALUE NOTED.  VALUE IS CONSISTENT WITH PREVIOUSLY REPORTED AND CALLED VALUE. Performed at [facility], [address] [place], [place] [address]    Culture [facility] POSITIVE COCCI  Final   Report Status PENDING   Incomplete  Blood Culture ID Panel (Reflexed)     Status: Abnormal   Collection Time: 04/18/22  6:40 PM  Result Value Ref Range Status   Enterococcus faecalis NOT DETECTED NOT DETECTED Final   Enterococcus Faecium NOT DETECTED NOT DETECTED Final   Listeria monocytogenes NOT DETECTED NOT DETECTED Final   Staphylococcus species DETECTED (A) NOT DETECTED Final    Comment: CRITICAL RESULT CALLED TO, READ BACK BY AND VERIFIED WITH: C/DEVON MITCHELL 04/19/22 0924 SLM    Staphylococcus aureus (BCID) NOT DETECTED NOT DETECTED Final   Staphylococcus epidermidis NOT DETECTED NOT DETECTED Final   Staphylococcus lugdunensis NOT DETECTED NOT DETECTED Final   Streptococcus species NOT DETECTED NOT DETECTED Final   Streptococcus agalactiae NOT DETECTED NOT DETECTED Final   Streptococcus pneumoniae NOT DETECTED NOT DETECTED Final   Streptococcus pyogenes NOT DETECTED NOT DETECTED Final   A.calcoaceticus-baumannii NOT DETECTED NOT DETECTED Final   Bacteroides fragilis NOT DETECTED NOT DETECTED Final   Enterobacterales NOT DETECTED NOT DETECTED Final   Enterobacter cloacae complex NOT DETECTED NOT DETECTED Final   Escherichia coli NOT DETECTED NOT DETECTED Final   Klebsiella aerogenes NOT DETECTED NOT DETECTED Final   Klebsiella oxytoca NOT DETECTED NOT DETECTED Final   Klebsiella pneumoniae NOT DETECTED NOT DETECTED Final   Proteus species NOT DETECTED NOT DETECTED Final   Salmonella species NOT DETECTED NOT DETECTED Final   Serratia marcescens NOT DETECTED NOT DETECTED Final   Haemophilus influenzae NOT DETECTED NOT DETECTED Final   Neisseria meningitidis NOT DETECTED NOT DETECTED Final   Pseudomonas aeruginosa NOT DETECTED NOT DETECTED Final   Stenotrophomonas maltophilia NOT DETECTED NOT DETECTED Final   Candida albicans NOT DETECTED NOT DETECTED Final   Candida auris NOT DETECTED NOT DETECTED Final   Candida glabrata NOT DETECTED NOT DETECTED Final   Candida krusei NOT DETECTED NOT DETECTED  Final   Candida parapsilosis NOT DETECTED NOT DETECTED Final   Candida tropicalis NOT DETECTED NOT DETECTED Final   Cryptococcus neoformans/gattii NOT DETECTED NOT DETECTED Final    Comment: Performed at [facility], [address] [place], [place] [address]    Coagulation Studies: No results for input(s): "LABPROT", "INR" in the last 72 hours.  Urinalysis: Recent Labs    04/18/22 1840  COLORURINE YELLOW*  LABSPEC 1.015  PHURINE 6.0  GLUCOSEU 150*  HGBUR MODERATE*  BILIRUBINUR NEGATIVE  KETONESUR 20*  PROTEINUR >=300*  NITRITE NEGATIVE  LEUKOCYTESUR NEGATIVE      Imaging: CT CHEST ABDOMEN PELVIS WO CONTRAST  Result Date: 04/18/2022 CLINICAL DATA:  Chest and abdominal pain with nausea and vomiting, initial encounter EXAM: CT CHEST, ABDOMEN AND PELVIS WITHOUT CONTRAST TECHNIQUE: Multidetector CT imaging of the chest, abdomen and pelvis was performed following the standard protocol without IV contrast. RADIATION DOSE REDUCTION:  This exam was performed according to the departmental dose-optimization program which includes automated exposure control, adjustment of the mA and/or kV according to patient size and/or use of iterative reconstruction technique. COMPARISON:  03/11/2021 CT, chest x-ray from earlier in the same day. FINDINGS: CT CHEST FINDINGS Cardiovascular: Limited due to lack of IV contrast. Atherosclerotic calcifications of the thoracic aorta are noted. No aneurysmal dilatation is seen. Heavy coronary calcifications are noted. No cardiac enlargement is seen. Mediastinum/Nodes: Thoracic inlet is within normal limits. No hilar or mediastinal adenopathy is noted. The esophagus as visualized is within normal limits. Lungs/Pleura: The lungs are well aerated bilaterally. Mild emphysematous changes are seen. Atelectatic changes in the right lower lobe are noted slightly more prominent than that seen on prior CT. Musculoskeletal: No acute rib abnormality is noted. No  compression deformity is seen. CT ABDOMEN PELVIS FINDINGS Hepatobiliary: No focal liver abnormality is seen. No gallstones, gallbladder wall thickening, or biliary dilatation. A small amount of perihepatic fluid is noted likely related to the patient's known peritoneal dialysis. Pancreas: Unremarkable. No pancreatic ductal dilatation or surrounding inflammatory changes. Spleen: Normal in size without focal abnormality. Adrenals/Urinary Tract: Adrenal glands are within normal limits. Kidneys are well visualized bilaterally. Simple appearing right renal cyst is noted. No follow-up is recommended. No obstructive changes are seen. The bladder is decompressed. Peritoneal dialysis catheter is noted deep within the pelvis. Stomach/Bowel: Diffuse diverticular change of the colon is noted particularly in the sigmoid. Some mild pericolonic inflammatory changes are seen consistent with early diverticulitis. No perforation or abscess formation is seen. The appendix is within normal limits. Small bowel and stomach are unremarkable. Vascular/Lymphatic: Aortic atherosclerosis. No enlarged abdominal or pelvic lymph nodes. Reproductive: Prostate is unremarkable. Other: No abdominal wall hernia or abnormality. Minimal free fluid is noted within the pelvis consistent with the known peritoneal dialysis. Musculoskeletal: Left hip prosthesis is seen. No acute bony abnormality is noted. IMPRESSION: CT of the chest: Right basilar atelectatic changes CT of the abdomen and pelvis: Mild diverticulitis in the sigmoid colon. No perforation or abscess is noted. Minimal residual free fluid consistent with patient's known peritoneal dialysis. Aortic Atherosclerosis (ICD10-I70.0) and Emphysema (ICD10-J43.9). Electronically Signed   By: Inez Catalina M.D.   On: 04/18/2022 19:59   CT Head Wo Contrast  Result Date: 04/18/2022 CLINICAL DATA:  Near syncope, vomiting EXAM: CT HEAD WITHOUT CONTRAST TECHNIQUE: Contiguous axial images were obtained from  the base of the skull through the vertex without intravenous contrast. RADIATION DOSE REDUCTION: This exam was performed according to the departmental dose-optimization program which includes automated exposure control, adjustment of the mA and/or kV according to patient size and/or use of iterative reconstruction technique. COMPARISON:  02/03/2020 FINDINGS: Brain: No evidence of acute infarction, hemorrhage, mass, mass effect, or midline shift. No hydrocephalus or extra-axial fluid collection. Periventricular white matter changes, likely the sequela of chronic small vessel ischemic disease. Vascular: No hyperdense vessel. Atherosclerotic calcifications in the intracranial carotid and vertebral arteries. Skull: Negative for fracture or focal lesion. Sinuses/Orbits: No acute finding.  Prior right lens replacement. Other: The mastoid air cells are well aerated. IMPRESSION: No acute intracranial process. Electronically Signed   By: Merilyn Baba M.D.   On: 04/18/2022 19:51   DG Chest Port 1 View  Result Date: 04/18/2022 CLINICAL DATA:  Question of sepsis to evaluate for abnormality. Nausea, vomiting, diarrhea, chest pain, abdominal pain. Poor intake. Near syncope. EXAM: PORTABLE CHEST 1 VIEW COMPARISON:  12/02/2020 FINDINGS: Linear scarring in the lung bases and nodular scarring  in the apices is similar to prior study. No evidence of developing airspace disease or consolidation. No pleural effusions. No pneumothorax. Mediastinal contours appear intact. Heart size and pulmonary vascularity are normal. Shallow inspiration. Calcification of the aorta. IMPRESSION: No evidence of active pulmonary disease. Chronic scarring in the lungs is similar to prior study. Electronically Signed   By: Lucienne Capers M.D.   On: 04/18/2022 19:09     Medications:    cefTRIAXone (ROCEPHIN)  IV Stopped (04/19/22 1122)   dialysis solution 1.5% low-MG/low-CA Stopped (04/19/22 1201)   lactated ringers 100 mL/hr at 04/19/22 0847    metronidazole 500 mg (04/19/22 1127)    apixaban  2.5 mg Oral BID   gabapentin  300 mg Oral QHS   gentamicin cream  1 Application Topical Daily   insulin aspart  0-5 Units Subcutaneous QHS   insulin aspart  0-9 Units Subcutaneous TID WC   isosorbide mononitrate  60 mg Oral Daily   metoprolol succinate  100 mg Oral Daily   pantoprazole  40 mg Oral Daily   psyllium  1 packet Oral BID   rosuvastatin  40 mg Oral Daily   morphine injection, nitroGLYCERIN, ondansetron **OR** ondansetron (ZOFRAN) IV  Assessment/ Plan:  [name] is a 72 y.o.  male with past medical conditions including GERD, diabetes, CAD, hypertension, gout, hyperlipidemia, atrial fibrillation, and end-stage renal disease on peritoneal dialysis.  Patient presents to the emergency department with complaints of nausea, vomiting, diarrhea, and abdominal pain.  Patient has been admitted for Acute diverticulitis [K57.92]  [facility] CCPD Fills: 2.5L/ 4 exchanges/ 1.5hr dwell. No last fill  End stage renal disease on peritoneal dialysis.   Last treatment completed on Saturday. Will continue nightly peritoneal dialysis treatments per outpatient orders.   2. Anemia of chronic kidney disease Lab Results  Component Value Date   HGB 10.6 (L) 04/19/2022  Patient receives Mircera monthly at outpatient clinic. Hemoglobin at goal.   3. Secondary Hyperparathyroidism: with outpatient labs: PTH 854, phosphorus 5.3, calcium 7.8 on 03/26/22.   Lab Results  Component Value Date   CALCIUM 8.5 (L) 04/19/2022   PHOS 4.5 03/08/2021    Patient receives calcitriol, cholecalciferol, and calcium carbonate outpatient.  4. Hypertension with chronic kidney disease. Home regimen includes losartan, amlodipine, isosorbide, and metoprolol. Amlodipine and losartan held  5. Diverticulitis, uncomplicated. Mild diverticulitis seen on CT. Will order PD cell count and culture to rule out other factors.    LOS: 1   3/25/202412:05  PM

## 2022-04-20 DIAGNOSIS — K5792 Diverticulitis of intestine, part unspecified, without perforation or abscess without bleeding: Secondary | ICD-10-CM | POA: Diagnosis not present

## 2022-04-20 LAB — BASIC METABOLIC PANEL
Anion gap: 7 (ref 5–15)
BUN: 35 mg/dL — ABNORMAL HIGH (ref 8–23)
CO2: 23 mmol/L (ref 22–32)
Calcium: 7.5 mg/dL — ABNORMAL LOW (ref 8.9–10.3)
Chloride: 107 mmol/L (ref 98–111)
Creatinine, Ser: 4.8 mg/dL — ABNORMAL HIGH (ref 0.61–1.24)
GFR, Estimated: 12 mL/min — ABNORMAL LOW (ref >60)
Glucose, Bld: 94 mg/dL (ref 70–99)
Potassium: 2.9 mmol/L — ABNORMAL LOW (ref 3.5–5.1)
Sodium: 137 mmol/L (ref 135–145)

## 2022-04-20 LAB — CBC WITH DIFFERENTIAL/PLATELET
Abs Immature Granulocytes: 0.09 10*3/uL — ABNORMAL HIGH (ref 0.00–0.07)
Basophils Absolute: 0 10*3/uL (ref 0.0–0.1)
Basophils Relative: 0 %
Eosinophils Absolute: 0.1 10*3/uL (ref 0.0–0.5)
Eosinophils Relative: 1 %
HCT: 27.7 % — ABNORMAL LOW (ref 39.0–52.0)
Hemoglobin: 9.1 g/dL — ABNORMAL LOW (ref 13.0–17.0)
Immature Granulocytes: 1 %
Lymphocytes Relative: 12 %
Lymphs Abs: 1.2 10*3/uL (ref 0.7–4.0)
MCH: 31.7 pg (ref 26.0–34.0)
MCHC: 32.9 g/dL (ref 30.0–36.0)
MCV: 96.5 fL (ref 80.0–100.0)
Monocytes Absolute: 0.9 10*3/uL (ref 0.1–1.0)
Monocytes Relative: 9 %
Neutro Abs: 7.3 10*3/uL (ref 1.7–7.7)
Neutrophils Relative %: 77 %
Platelets: 211 10*3/uL (ref 150–400)
RBC: 2.87 MIL/uL — ABNORMAL LOW (ref 4.22–5.81)
RDW: 13 % (ref 11.5–15.5)
WBC: 9.6 10*3/uL (ref 4.0–10.5)
nRBC: 0 % (ref 0.0–0.2)

## 2022-04-20 LAB — HEMOGLOBIN A1C
Hgb A1c MFr Bld: 5.4 % (ref 4.8–5.6)
Mean Plasma Glucose: 108 mg/dL

## 2022-04-20 LAB — GLUCOSE, CAPILLARY
Glucose-Capillary: 91 mg/dL (ref 70–99)
Glucose-Capillary: 127 mg/dL — ABNORMAL HIGH (ref 70–99)
Glucose-Capillary: 154 mg/dL — ABNORMAL HIGH (ref 70–99)
Glucose-Capillary: 148 mg/dL — ABNORMAL HIGH (ref 70–99)

## 2022-04-20 LAB — HEPATITIS B SURFACE ANTIBODY, QUANTITATIVE: Hep B S AB Quant (Post): 5.8 m[IU]/mL — ABNORMAL LOW (ref >9.9)

## 2022-04-20 LAB — PATHOLOGIST SMEAR REVIEW

## 2022-04-20 MED ORDER — DELFLEX-LC/1.5% DEXTROSE 344 MOSM/L IP SOLN
Freq: Every day | INTRAPERITONEAL | Status: DC
  Filled 2022-04-20: qty 2500

## 2022-04-20 MED ORDER — POTASSIUM CHLORIDE CRYS ER 20 MEQ PO TBCR
40.0000 meq | EXTENDED_RELEASE_TABLET | ORAL | Status: AC
  Administered 2022-04-20 (×2): 40 meq via ORAL
  Filled 2022-04-20 (×2): qty 2

## 2022-04-20 MED ORDER — DELFLEX-LC/1.5% DEXTROSE 344 MOSM/L IP SOLN
Freq: Every day | INTRAPERITONEAL | Status: DC
  Filled 2022-04-20 (×3): qty 3000

## 2022-04-20 MED ORDER — CALCIUM CARBONATE ANTACID 500 MG PO CHEW
2.0000 | CHEWABLE_TABLET | Freq: Every day | ORAL | Status: DC
  Administered 2022-04-20 – 2022-04-24 (×5): 400 mg via ORAL
  Filled 2022-04-20 (×5): qty 2

## 2022-04-20 MED ORDER — CALCITRIOL 0.25 MCG PO CAPS
0.2500 ug | ORAL_CAPSULE | Freq: Every day | ORAL | Status: DC
  Administered 2022-04-20 – 2022-04-24 (×5): 0.25 ug via ORAL
  Filled 2022-04-20 (×5): qty 1

## 2022-04-20 NOTE — Progress Notes (Signed)
Pharmacy Antibiotic Note  Casey French is a 72 y.o. male admitted on 04/18/2022 with possible bacteremia.  Patient presented with N/V/D and abdominal pain.  CT revealed mild diveriticulitis and patient started on ceftriaxone and metronidazole.  Pharmacy has been consulted for vancomycin dosing for possible bacteremia (staphylococcus species) as well as intra-peritoneal cefepime dosing.  Today, 04/20/2022 Day 1 ceftriaxone/metronidazole Renal: ESRD on PD WBC WNL Afebrile 3/24 blood cultures initially 1/4 bottles GPC, but now 3/4 bottles with GPC, S. hominis Note both sets drawn at same time, ?? Drawn from same site (despite documentation saying from two different sites) Nephrology ordering peritoneal fluid analysis and culture - fluid analysis not glaring for peritonitis but nephrology concerned for translocation of bacteria in setting of diverticulitis.   3/25 peritoneal fluid analysis: 248 Nucleated cells, 34% neutrophils  Plan: Given 1500mg  IV x 1 3/25 with vancomycin level ordered for tomorrow morning.   Stop ceftriaxone IV and start cefepime 1200mg  added to a 3L dialysate bag with patient receiving 2.5L during day dwell (will receive PD during the night).  Patient will receive cefepime 1gm w/ the 2.5L of dialysate.  Follow-up culture results  Height: 5\' 9"  (175.3 cm) Weight: 65.8 kg (145 lb 1 oz) IBW/kg (Calculated) : 70.7  Temp (24hrs), Avg:97.6 F (36.4 C), Min:96.6 F (35.9 C), Max:98.1 F (36.7 C)  Recent Labs  Lab 04/18/22 1809 04/18/22 1840 04/18/22 2030 04/19/22 0506 04/20/22 0600  WBC 9.2  --   --  9.4 9.6  CREATININE 5.28*  --   --  5.58* 4.80*  LATICACIDVEN  --  2.4* 1.5  --   --      Estimated Creatinine Clearance: 13.1 mL/min (A) (by C-G formula based on SCr of 4.8 mg/dL (H)).    Allergies  Allergen Reactions   Morphine Anxiety    agitation     Antimicrobials this admission: 3/24 ceftriaxone >>3/26 3/24 metronidazole >> 3/25 vancomycin >> 3/26  cefepime IP >>  Dose adjustments this admission:   Microbiology results: 3/24 BCx: 3/4 bottles with GPC- S hominis in both sets 3/25 bcx: NGTD 3/25 peritoneal fluid:NG  Thank you for allowing pharmacy to be a part of this patient's care.  Doreene Eland, PharmD, BCPS, BCIDP Work Cell: [phone] 04/20/2022 1:38 PM

## 2022-04-20 NOTE — Progress Notes (Signed)
     Peritoneal Dialysis Treatment Initiation Note     Pre Treatment Weight:64.1kg   Consent signed and in chart.  PD treatment initiated via aseptic technique.    Patient is awake and alert. No complaints of pain.     Dressing changed PD exit site clean, dry and intact, Gentamicin applied      Hand-off given to the patient's nurse.  Education provided to dept staff  regarding PD machine and how  to contact tech support if machine  alarms.      Ilda Mori RN [facility]

## 2022-04-20 NOTE — Progress Notes (Signed)
Progress Note   Patient: Casey French [id number] DOB: 10-01-1950 DOA: 04/18/2022     2 DOS: the patient was seen and examined on 04/20/2022    Subjective:  Patient seen and examined at bedside this morning He admits to improvement in his abdominal pain Still continues to require IV antibiotics, will follow-up culture results Denies nausea vomiting chest pain or cough     Brief hospital course: Casey French is a 72 y.o. male with medical history significant of coronary artery disease, diabetes, GERD, history of GI bleed, essential hypertension, hyperlipidemia, gout, depression, persistent atrial fibrillation on chronic anticoagulation who was brought in from home with nausea vomiting diarrhea as well as abdominal pain. Patient came to the ER where his blood pressure was 200/110.   Imaging showed acute uncomplicated sigmoid diverticulitis.       Assessment and Plan:   #acute uncomplicated sigmoid diverticulitis: Patient with multiple comorbidities.   Continue current antibiotic therapy Continue pain management and nausea management.   Advancement of diet as tolerated   # Gram-positive bacteremia Patient received IV vancomycin dosing being followed by pharmacist awaiting repeat cultures Cefepime has been changed to ceftriaxone Nephrologist plans to complete 3 weeks course of ceftriaxone to cover for possible peritonitis in the setting of peritoneal dialysis catheter being in place  # end-stage renal disease on peritoneal dialysis:  Consult ed nephrology for dialysis needs   #hypokalemia: Continue repletion and monitoring   # Syncopal episode likely secondary to above Treatment as above    # lactic acidosis: Most likely as a result of infection. Patient received a course of IV fluid which is currently discontinued   # diabetes:  Continue sliding scale insulin.   # GERD: Continue PPIs.   # hypertensive urgency: Systolic of [id number] on presentation.   Blood  pressure improved  Continue IV hydralazine or labetalol. Continue metoprolol, isosorbide mononitrate Monitor blood pressure closely   # hyperlipidemia: Continue statin   #coronary artery disease: No acute decompensation.    Advance Care Planning:   Code Status: Full Code    Consults: Nephrology consulted as well as clinical pharmacist   Family Communication: Wife     Physical Exam: Constitutional: Elderly male lying in bed, in no acute distress Eyes: PERRL, lids and conjunctivae normal ENMT: Mucous membranes are moist.  Neck: normal, supple, no masses, no thyromegaly Respiratory: clear to auscultation bilaterally,  Cardiovascular: Regular rate and rhythm,  Abdomen: Abdominal tenderness resolved no masses palpated. Peritoneal dialysis catheter in place Musculoskeletal: Good range of motion, no joint swelling or tenderness, Skin: no rashes, lesions, ulcers. No induration Neurologic: CN 2-12 grossly intact.     Data Reviewed: CBC and BMP  reviewed by me today showing severe hypokalemia of 2.9     Family Communication: No family member at bedside   Disposition: Status is: Inpatient Team continues to meet inpatient criteria as he is currently requiring IV antibiotics    Planned Discharge Destination: Prior living condition   Time spent: 45 minutes     Vitals:   04/19/22 1935 04/20/22 0500 04/20/22 0523 04/20/22 0749  BP: 125/80  130/72 122/70  Pulse: 62  68 65  Resp: 16  18 18   Temp: 97.6 F (36.4 C)  97.9 F (36.6 C) 98.1 F (36.7 C)  TempSrc: Axillary     SpO2: 99%  96% 98%  Weight:  65.8 kg    Height:         Author: Verline Lema, MD 04/20/2022 1:47 PM  For on call review [email or web address].

## 2022-04-20 NOTE — Progress Notes (Signed)
[facility]  ROUNDING NOTE   Subjective:   Casey French is a 72 year old male with past medical conditions including GERD, diabetes, CAD, hypertension, gout, hyperlipidemia, atrial fibrillation, and end-stage renal disease on peritoneal dialysis.  Patient presents to the emergency department with complaints of nausea, vomiting, diarrhea, and abdominal pain.  Patient has been admitted for Diverticulitis [K57.92] Acute diverticulitis [K57.92]  Patient is known to our practice and is followed by [facility], Dr. Juleen China for peritoneal dialysis.    Patient seen sitting up in bed Alert and oriented States peritoneal dialysis went well overnight, alarms during fill cycles Complains of nausea, denies vomiting and diarrhea since admission  Objective:  Vital signs in last 24 hours:  Temp:  [96.6 F (35.9 C)-98.1 F (36.7 C)] 98.1 F (36.7 C) (03/26 0749) Pulse Rate:  [59-68] 65 (03/26 0749) Resp:  [16-20] 18 (03/26 0749) BP: (109-130)/(67-82) 122/70 (03/26 0749) SpO2:  [96 %-99 %] 98 % (03/26 0749) Weight:  [61.6 kg-65.8 kg] 65.8 kg (03/26 0500)  Weight change: -4.172 kg Filed Weights   04/18/22 1804 04/19/22 1831 04/20/22 0500  Weight: 65.8 kg 61.6 kg 65.8 kg    Intake/Output: I/O last 3 completed shifts: In: 2168.1 [I.V.:1545; IV Piggyback:623.1] Out: [id number] [Urine:1000; Emesis/NG output:100; Other:437]   Intake/Output this shift:  Total I/O In: 480 [P.O.:480] Out: -   Physical Exam: General: NAD  Head: Normocephalic, atraumatic. Dry oral mucosal membranes  Eyes: Anicteric  Lungs:  Clear to auscultation, normal effort  Heart: Regular rate and rhythm  Abdomen:  Soft, nontender. [place]  Extremities:  No peripheral edema.  Neurologic: Alert and oriented, moving all four extremities  Skin: No lesions  Access: [facility]    Basic Metabolic Panel: Recent Labs  Lab 04/18/22 1809 04/18/22 1825 04/19/22 0506 04/20/22 0600  NA 135  --  140 137  K 3.3*  --   3.9 2.9*  CL 108  --  107 107  CO2 16*  --  21* 23  GLUCOSE 120*  --  102* 94  BUN 45*  --  44* 35*  CREATININE 5.28*  --  5.58* 4.80*  CALCIUM 7.8*  --  8.5* 7.5*  MG  --  1.9  --   --      Liver Function Tests: Recent Labs  Lab 04/18/22 1809 04/19/22 0506  AST 26 18  ALT 14 12  ALKPHOS 124 103  BILITOT 1.1 0.4  PROT 6.8 5.9*  ALBUMIN 3.0* 2.6*    Recent Labs  Lab 04/18/22 1809  LIPASE 63*    No results for input(s): "AMMONIA" in the last 168 hours.  CBC: Recent Labs  Lab 04/18/22 1809 04/19/22 0506 04/20/22 0600  WBC 9.2 9.4 9.6  NEUTROABS  --   --  7.3  HGB 12.3* 10.6* 9.1*  HCT 37.0* 31.9* 27.7*  MCV 95.4 96.1 96.5  PLT 288 240 211     Cardiac Enzymes: No results for input(s): "CKTOTAL", "CKMB", "CKMBINDEX", "TROPONINI" in the last 168 hours.  BNP: Invalid input(s): "POCBNP"  CBG: Recent Labs  Lab 04/19/22 1131 04/19/22 1634 04/19/22 2109 04/20/22 0748 04/20/22 1138  GLUCAP 124* 114* 148* 91 127*     Microbiology: Results for orders placed or performed during the hospital encounter of 04/18/22  Resp panel by RT-PCR (RSV, Flu A&B, Covid) Anterior Nasal Swab     Status: None   Collection Time: 04/18/22  6:14 PM   Specimen: Anterior Nasal Swab  Result Value Ref Range Status   SARS  Coronavirus 2 by RT PCR NEGATIVE NEGATIVE Final    Comment: (NOTE) SARS-CoV-2 target nucleic acids are NOT DETECTED.  The SARS-CoV-2 RNA is generally detectable in upper respiratory specimens during the acute phase of infection. The lowest concentration of SARS-CoV-2 viral copies this assay can detect is 138 copies/mL. A negative result does not preclude SARS-Cov-2 infection and should not be used as the sole basis for treatment or other patient management decisions. A negative result may occur with  improper specimen collection/handling, submission of specimen other than nasopharyngeal swab, presence of viral mutation(s) within the areas targeted by this  assay, and inadequate number of viral copies(<138 copies/mL). A negative result must be combined with clinical observations, patient history, and epidemiological information. The expected result is Negative.  Fact Sheet for Patients:  EntrepreneurPulse.com.au  Fact Sheet for Healthcare Providers:  IncredibleEmployment.be  This test is no t yet approved or cleared by the [place] FDA and  has been authorized for detection and/or diagnosis of SARS-CoV-2 by FDA under an Emergency Use Authorization (EUA). This EUA will remain  in effect (meaning this test can be used) for the duration of the COVID-19 declaration under Section 564(b)(1) of the Act, 21 U.S.C.section 360bbb-3(b)(1), unless the authorization is terminated  or revoked sooner.       Influenza A by PCR NEGATIVE NEGATIVE Final   Influenza B by PCR NEGATIVE NEGATIVE Final    Comment: (NOTE) The Xpert Xpress SARS-CoV-2/FLU/RSV plus assay is intended as an aid in the diagnosis of influenza from Nasopharyngeal swab specimens and should not be used as a sole basis for treatment. Nasal washings and aspirates are unacceptable for Xpert Xpress SARS-CoV-2/FLU/RSV testing.  Fact Sheet for Patients: EntrepreneurPulse.com.au  Fact Sheet for Healthcare Providers: IncredibleEmployment.be  This test is not yet approved or cleared by the [place] FDA and has been authorized for detection and/or diagnosis of SARS-CoV-2 by FDA under an Emergency Use Authorization (EUA). This EUA will remain in effect (meaning this test can be used) for the duration of the COVID-19 declaration under Section 564(b)(1) of the Act, 21 U.S.C. section 360bbb-3(b)(1), unless the authorization is terminated or revoked.     Resp Syncytial Virus by PCR NEGATIVE NEGATIVE Final    Comment: (NOTE) Fact Sheet for Patients: EntrepreneurPulse.com.au  Fact Sheet for  Healthcare Providers: IncredibleEmployment.be  This test is not yet approved or cleared by the [place] FDA and has been authorized for detection and/or diagnosis of SARS-CoV-2 by FDA under an Emergency Use Authorization (EUA). This EUA will remain in effect (meaning this test can be used) for the duration of the COVID-19 declaration under Section 564(b)(1) of the Act, 21 U.S.C. section 360bbb-3(b)(1), unless the authorization is terminated or revoked.  Performed at [facility], [address] [place], [place] [address]   Blood Culture (routine x 2)     Status: Abnormal (Preliminary result)   Collection Time: 04/18/22  6:40 PM   Specimen: BLOOD  Result Value Ref Range Status   Specimen Description   Final    BLOOD BLOOD RIGHT HAND Performed at [facility], [address], [place], [place] [address]    Special Requests   Final    BOTTLES DRAWN AEROBIC AND ANAEROBIC Blood Culture adequate volume Performed at [facility], [address], [place], [place] [address]    Culture  Setup Time   Final    GRAM POSITIVE COCCI IN BOTH AEROBIC AND ANAEROBIC BOTTLES Organism ID to follow CRITICAL RESULT CALLED TO, READ  BACK BY AND VERIFIED WITH: C/DEVON [facility] 04/19/22 [id number] SLM Performed at [facility], [address], [place], [place] [address]    Culture (A)  Final    STAPHYLOCOCCUS HOMINIS CULTURE REINCUBATED FOR BETTER GROWTH Performed at [facility], [place] [address] [place], [place] [address]    Report Status PENDING  Incomplete  Blood Culture (routine x 2)     Status: Abnormal (Preliminary result)   Collection Time: 04/18/22  6:40 PM   Specimen: BLOOD  Result Value Ref Range Status   Specimen Description   Final    BLOOD BLOOD LEFT ARM Performed at [facility], [address], [place], [place] [address]    Special Requests   Final    BOTTLES DRAWN AEROBIC AND ANAEROBIC Blood Culture adequate  volume Performed at [facility], [address] [place], [place] [address]    Culture  Setup Time   Final    GRAM POSITIVE COCCI AEROBIC BOTTLE ONLY CRITICAL VALUE NOTED.  VALUE IS CONSISTENT WITH PREVIOUSLY REPORTED AND CALLED VALUE. Performed at [facility] [address] [place], [place] [address]    Culture STAPHYLOCOCCUS HOMINIS (A)  Final   Report Status PENDING  Incomplete  Blood Culture ID Panel (Reflexed)     Status: Abnormal   Collection Time: 04/18/22  6:40 PM  Result Value Ref Range Status   Enterococcus faecalis NOT DETECTED NOT DETECTED Final   Enterococcus Faecium NOT DETECTED NOT DETECTED Final   Listeria monocytogenes NOT DETECTED NOT DETECTED Final   Staphylococcus species DETECTED (A) NOT DETECTED Final    Comment: CRITICAL RESULT CALLED TO, READ BACK BY AND VERIFIED WITH: C/DEVON MITCHELL 04/19/22 0924 SLM    Staphylococcus aureus (BCID) NOT DETECTED NOT DETECTED Final   Staphylococcus epidermidis NOT DETECTED NOT DETECTED Final   Staphylococcus lugdunensis NOT DETECTED NOT DETECTED Final   Streptococcus species NOT DETECTED NOT DETECTED Final   Streptococcus agalactiae NOT DETECTED NOT DETECTED Final   Streptococcus pneumoniae NOT DETECTED NOT DETECTED Final   Streptococcus pyogenes NOT DETECTED NOT DETECTED Final   A.calcoaceticus-baumannii NOT DETECTED NOT DETECTED Final   Bacteroides fragilis NOT DETECTED NOT DETECTED Final   Enterobacterales NOT DETECTED NOT DETECTED Final   Enterobacter cloacae complex NOT DETECTED NOT DETECTED Final   Escherichia coli NOT DETECTED NOT DETECTED Final   Klebsiella aerogenes NOT DETECTED NOT DETECTED Final   Klebsiella oxytoca NOT DETECTED NOT DETECTED Final   Klebsiella pneumoniae NOT DETECTED NOT DETECTED Final   Proteus species NOT DETECTED NOT DETECTED Final   Salmonella species NOT DETECTED NOT DETECTED Final   Serratia marcescens NOT DETECTED NOT DETECTED Final   Haemophilus influenzae NOT  DETECTED NOT DETECTED Final   Neisseria meningitidis NOT DETECTED NOT DETECTED Final   Pseudomonas aeruginosa NOT DETECTED NOT DETECTED Final   Stenotrophomonas maltophilia NOT DETECTED NOT DETECTED Final   Candida albicans NOT DETECTED NOT DETECTED Final   Candida auris NOT DETECTED NOT DETECTED Final   Candida glabrata NOT DETECTED NOT DETECTED Final   Candida krusei NOT DETECTED NOT DETECTED Final   Candida parapsilosis NOT DETECTED NOT DETECTED Final   Candida tropicalis NOT DETECTED NOT DETECTED Final   Cryptococcus neoformans/gattii NOT DETECTED NOT DETECTED Final    Comment: Performed at [facility], [address] [place],  [address]  Culture, blood (Routine X 2) w Reflex to ID Panel     Status: None (Preliminary result)   Collection Time: 04/19/22  3:56 PM  Specimen: BLOOD  Result Value Ref Range Status   Specimen Description BLOOD BLOOD RIGHT ARM  Final   Special Requests   Final    BOTTLES DRAWN AEROBIC AND ANAEROBIC Blood Culture results may not be optimal due to an excessive volume of blood received in culture bottles   Culture   Final    NO GROWTH < 12 HOURS Performed at [facility], [address], [place], [place] [address]    Report Status PENDING  Incomplete  Culture, blood (Routine X 2) w Reflex to ID Panel     Status: None (Preliminary result)   Collection Time: 04/19/22  3:58 PM   Specimen: BLOOD  Result Value Ref Range Status   Specimen Description BLOOD BLOOD RIGHT WRIST  Final   Special Requests   Final    BOTTLES DRAWN AEROBIC AND ANAEROBIC Blood Culture results may not be optimal due to an inadequate volume of blood received in culture bottles   Culture   Final    NO GROWTH < 12 HOURS Performed at [facility], [address] [place], [place] [address]    Report Status PENDING  Incomplete  Body fluid culture w Gram Stain     Status: None (Preliminary result)   Collection Time: 04/19/22  6:54 PM   Specimen:  Peritoneal Washings; Body Fluid  Result Value Ref Range Status   Specimen Description   Final    PERITONEAL Performed at [facility], [address], [place], [place] [address]    Special Requests   Final    PERITONEAL Performed at [facility], [address] [place], [place] [address]    Gram Stain   Final    RARE WBC PRESENT, PREDOMINANTLY MONONUCLEAR NO ORGANISMS SEEN CYTOSPIN SMEAR Performed at [facility], [place] [address], [place], [place] [address]    Culture PENDING  Incomplete   Report Status PENDING  Incomplete    Coagulation Studies: No results for input(s): "LABPROT", "INR" in the last 72 hours.  Urinalysis: Recent Labs    04/18/22 1840  COLORURINE YELLOW*  LABSPEC 1.015  PHURINE 6.0  GLUCOSEU 150*  HGBUR MODERATE*  BILIRUBINUR NEGATIVE  KETONESUR 20*  PROTEINUR >=300*  NITRITE NEGATIVE  LEUKOCYTESUR NEGATIVE       Imaging: CT CHEST ABDOMEN PELVIS WO CONTRAST  Result Date: 04/18/2022 CLINICAL DATA:  Chest and abdominal pain with nausea and vomiting, initial encounter EXAM: CT CHEST, ABDOMEN AND PELVIS WITHOUT CONTRAST TECHNIQUE: Multidetector CT imaging of the chest, abdomen and pelvis was performed following the standard protocol without IV contrast. RADIATION DOSE REDUCTION: This exam was performed according to the departmental dose-optimization program which includes automated exposure control, adjustment of the mA and/or kV according to patient size and/or use of iterative reconstruction technique. COMPARISON:  03/11/2021 CT, chest x-ray from earlier in the same day. FINDINGS: CT CHEST FINDINGS Cardiovascular: Limited due to lack of IV contrast. Atherosclerotic calcifications of the thoracic aorta are noted. No aneurysmal dilatation is seen. Heavy coronary calcifications are noted. No cardiac enlargement is seen. Mediastinum/Nodes: Thoracic inlet is within normal limits. No hilar or mediastinal adenopathy is noted. The esophagus  as visualized is within normal limits. Lungs/Pleura: The lungs are well aerated bilaterally. Mild emphysematous changes are seen. Atelectatic changes in the right lower lobe are noted slightly more prominent than that seen on prior CT. Musculoskeletal: No acute rib abnormality is noted. No compression deformity is seen. CT ABDOMEN PELVIS FINDINGS Hepatobiliary: No focal liver abnormality is seen.  No gallstones, gallbladder wall thickening, or biliary dilatation. A small amount of perihepatic fluid is noted likely related to the patient's known peritoneal dialysis. Pancreas: Unremarkable. No pancreatic ductal dilatation or surrounding inflammatory changes. Spleen: Normal in size without focal abnormality. Adrenals/Urinary Tract: Adrenal glands are within normal limits. Kidneys are well visualized bilaterally. Simple appearing right renal cyst is noted. No follow-up is recommended. No obstructive changes are seen. The bladder is decompressed. Peritoneal dialysis catheter is noted deep within the pelvis. Stomach/Bowel: Diffuse diverticular change of the colon is noted particularly in the sigmoid. Some mild pericolonic inflammatory changes are seen consistent with early diverticulitis. No perforation or abscess formation is seen. The appendix is within normal limits. Small bowel and stomach are unremarkable. Vascular/Lymphatic: Aortic atherosclerosis. No enlarged abdominal or pelvic lymph nodes. Reproductive: Prostate is unremarkable. Other: No abdominal wall hernia or abnormality. Minimal free fluid is noted within the pelvis consistent with the known peritoneal dialysis. Musculoskeletal: Left hip prosthesis is seen. No acute bony abnormality is noted. IMPRESSION: CT of the chest: Right basilar atelectatic changes CT of the abdomen and pelvis: Mild diverticulitis in the sigmoid colon. No perforation or abscess is noted. Minimal residual free fluid consistent with patient's known peritoneal dialysis. Aortic  Atherosclerosis (ICD10-I70.0) and Emphysema (ICD10-J43.9). Electronically Signed   By: Inez Catalina M.D.   On: 04/18/2022 19:59   CT Head Wo Contrast  Result Date: 04/18/2022 CLINICAL DATA:  Near syncope, vomiting EXAM: CT HEAD WITHOUT CONTRAST TECHNIQUE: Contiguous axial images were obtained from the base of the skull through the vertex without intravenous contrast. RADIATION DOSE REDUCTION: This exam was performed according to the departmental dose-optimization program which includes automated exposure control, adjustment of the mA and/or kV according to patient size and/or use of iterative reconstruction technique. COMPARISON:  02/03/2020 FINDINGS: Brain: No evidence of acute infarction, hemorrhage, mass, mass effect, or midline shift. No hydrocephalus or extra-axial fluid collection. Periventricular white matter changes, likely the sequela of chronic small vessel ischemic disease. Vascular: No hyperdense vessel. Atherosclerotic calcifications in the intracranial carotid and vertebral arteries. Skull: Negative for fracture or focal lesion. Sinuses/Orbits: No acute finding.  Prior right lens replacement. Other: The mastoid air cells are well aerated. IMPRESSION: No acute intracranial process. Electronically Signed   By: Merilyn Baba M.D.   On: 04/18/2022 19:51   DG Chest Port 1 View  Result Date: 04/18/2022 CLINICAL DATA:  Question of sepsis to evaluate for abnormality. Nausea, vomiting, diarrhea, chest pain, abdominal pain. Poor intake. Near syncope. EXAM: PORTABLE CHEST 1 VIEW COMPARISON:  12/02/2020 FINDINGS: Linear scarring in the lung bases and nodular scarring in the apices is similar to prior study. No evidence of developing airspace disease or consolidation. No pleural effusions. No pneumothorax. Mediastinal contours appear intact. Heart size and pulmonary vascularity are normal. Shallow inspiration. Calcification of the aorta. IMPRESSION: No evidence of active pulmonary disease. Chronic scarring  in the lungs is similar to prior study. Electronically Signed   By: Lucienne Capers M.D.   On: 04/18/2022 19:09     Medications:    cefTRIAXone (ROCEPHIN)  IV 2 g (04/20/22 0903)   dialysis solution 1.5% low-MG/low-CA 1,000 mL (04/19/22 1900)   metronidazole 500 mg (04/20/22 1208)    apixaban  2.5 mg Oral BID   gabapentin  300 mg Oral QHS   gentamicin cream  1 Application Topical Daily   insulin aspart  0-5 Units Subcutaneous QHS   insulin aspart  0-9 Units Subcutaneous TID WC   isosorbide mononitrate  60  mg Oral Daily   metoprolol succinate  100 mg Oral Daily   pantoprazole  40 mg Oral Daily   psyllium  1 packet Oral BID   rosuvastatin  40 mg Oral Daily   vancomycin variable dose per unstable renal function (pharmacist dosing)   Does not apply See admin instructions   morphine injection, nitroGLYCERIN, ondansetron **OR** ondansetron (ZOFRAN) IV  Assessment/ Plan:  [name] is a 72 y.o.  male with past medical conditions including GERD, diabetes, CAD, hypertension, gout, hyperlipidemia, atrial fibrillation, and end-stage renal disease on peritoneal dialysis.  Patient presents to the emergency department with complaints of nausea, vomiting, diarrhea, and abdominal pain.  Patient has been admitted for Diverticulitis [K57.92] Acute diverticulitis [K57.92]  [facility] Fills: 2.5L/ 4 exchanges/ 1.5hr dwell. No last fill  End stage renal disease on peritoneal dialysis.   Patient received treatment overnight, UF 426ml. Will continue nightly treatments with 1.5% dialysate.   2. Anemia of chronic kidney disease Lab Results  Component Value Date   HGB 9.1 (L) 04/20/2022  Patient receives Mircera monthly at outpatient clinic. Hemoglobin stable  3. Secondary Hyperparathyroidism: with outpatient labs: PTH 854, phosphorus 5.3, calcium 7.8 on 03/26/22.   Lab Results  Component Value Date   CALCIUM 7.5 (L) 04/20/2022   PHOS 4.5 03/08/2021    Patient receives calcitriol,  cholecalciferol, and calcium carbonate outpatient. Will restart these due to decreased calcium.  4. Hypertension with chronic kidney disease. Home regimen includes losartan, amlodipine, isosorbide, and metoprolol. Amlodipine and losartan held  5. Diverticulitis, uncomplicated. Mild diverticulitis seen on CT. Cell count borderline, due to risk of infection, will order prophylactic antibiotics for peritonitis. Will order Vancomycin 1g and Cefepime 1g intraperitoneally for daytime dwell.    LOS: 2   3/26/20241:15 PM

## 2022-04-20 NOTE — TOC Initial Note (Signed)
Transition of Care [facility]) - Initial/Assessment Note    Patient Details  Name: [name] MRN: [id number] Date of Birth: August 08, 1950  Transition of Care [facility]) CM/SW Contact:    Beverly Sessions, RN Phone Number: 04/20/2022, 3:12 PM  Clinical Narrative:                   Transition of Care [facility]) Screening Note   Patient Details  Name: Casey French Date of Birth: [birth year]   Transition of Care [facility]) CM/SW Contact:    Beverly Sessions, RN Phone Number: 04/20/2022, 3:12 PM    Transition of Care Department [facility]) has reviewed patient and no TOC needs have been identified at this time. We will continue to monitor patient advancement through interdisciplinary progression rounds. If new patient transition needs arise, please place a TOC consult.  Please consult TOC if IV antibiotics indicated at time of discharge       Patient Goals and CMS Choice            Expected Discharge Plan and Services                                              Prior Living Arrangements/Services                       Activities of Daily Living Home Assistive Devices/Equipment: Other (Comment) (dialysis info) ADL Screening (condition at time of admission) Patient's cognitive ability adequate to safely complete daily activities?: Yes Is the patient deaf or have difficulty hearing?: No Does the patient have difficulty seeing, even when wearing glasses/contacts?: No Does the patient have difficulty concentrating, remembering, or making decisions?: No Patient able to express need for assistance with ADLs?: Yes Does the patient have difficulty dressing or bathing?: No Independently performs ADLs?: Yes (appropriate for developmental age) Does the patient have difficulty walking or climbing stairs?: No Weakness of Legs: None Weakness of Arms/Hands: None  Permission Sought/Granted                  Emotional Assessment              Admission  diagnosis:  Diverticulitis [K57.92] Acute diverticulitis [K57.92] Patient Active Problem List   Diagnosis Date Noted   Acute diverticulitis 04/18/2022   Hypokalemia 04/18/2022   Hyperkalemia 03/08/2021   Peritoneal dialysis catheter dysfunction ([place]) ESRD on peritoneal dialysis 03/07/2021   PAF (paroxysmal atrial fibrillation) ([place]) on chronic anticoagulation 03/07/2021   Chest pain 12/02/2020   HTN (hypertension) 12/02/2020   HLD (hyperlipidemia) 12/02/2020   Gout 12/02/2020   ESRD on peritoneal dialysis ([place]) 12/02/2020   B12 deficiency 07/10/2020   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   S/P hip replacement 04/22/2020   Hospital discharge follow-up 02/19/2020   Iron deficiency anemia 02/19/2020   Preop cardiovascular exam 01/09/2020   Anemia in chronic kidney disease 11/15/2019   Benign hypertensive kidney disease with chronic kidney disease 11/15/2019   Chronic kidney disease, stage 3b ([place]) 11/15/2019   Proteinuria 11/15/2019   Hematochezia 05/15/2019   Chronic anticoagulation 05/15/2019   Atrial fibrillation, chronic ([place]) 05/15/2019   Chronic renal disease, stage IV ([place]) 05/15/2019   Unstable angina ([place]) 11/10/2016   S/P cardiac catheterization 09/09/2015   Angina at rest 05/01/2015   Hypertensive urgency 06/14/2014   AKI (acute kidney injury) ([place]) 06/14/2014  Benign essential HTN 06/14/2014   Hyperlipidemia 06/14/2014   DM type 2 (diabetes mellitus, type 2) ([place]) 06/14/2014   CAD (coronary artery disease) 06/14/2014   GERD without esophagitis 06/14/2014   Acute kidney failure ([place]) 06/14/2014   Tremor 03/18/2014   Fall at home 08/30/2013   SOB (shortness of breath) on exertion 08/30/2013   Closed head injury 03/24/2013   Unspecified injury of head, initial encounter 03/24/2013   PCP:  [name], MD Pharmacy:   [place], [place] - [address] AT [facility] [place] [address] Phone:  [phone] Fax: [place] [id number] [place] [address] Phone: [phone] Fax: [phone]     Social Determinants of Health (SDOH) Social History: SDOH Screenings   Food Insecurity: No Food Insecurity (04/19/2022)  Housing: Low Risk  (04/19/2022)  Transportation Needs: No Transportation Needs (04/19/2022)  Utilities: Not At Risk (04/19/2022)  Tobacco Use: Medium Risk (04/19/2022)   SDOH Interventions:     Readmission Risk Interventions    03/10/2021    9:22 AM 04/24/2020    8:02 AM  Readmission Risk Prevention Plan  Transportation Screening Complete Complete  PCP or Specialist Appt within 3-5 Days Complete Complete  HRI or [place]  Complete  Social Work Consult for [place] Planning/Counseling Complete Complete  Palliative Care Screening Not Applicable Not Applicable  Medication Review Press photographer) Complete Complete

## 2022-04-20 NOTE — Progress Notes (Signed)
  Peritoneal Dialysis Treatment Disconnect note     Post Treatment Weight: 65.8kg   Consent signed and in chart.  PD treatment Disconnected via aseptic technique.    Patient is awake and alert. No complaints of pain.    PD exit site clean, dry and intact.     Hand-off given to the patient's nurse.     Darrol Jump LPN Kidney Dialysis Unit

## 2022-04-21 DIAGNOSIS — R7881 Bacteremia: Secondary | ICD-10-CM

## 2022-04-21 DIAGNOSIS — K5792 Diverticulitis of intestine, part unspecified, without perforation or abscess without bleeding: Secondary | ICD-10-CM | POA: Diagnosis not present

## 2022-04-21 DIAGNOSIS — B957 Other staphylococcus as the cause of diseases classified elsewhere: Secondary | ICD-10-CM | POA: Diagnosis not present

## 2022-04-21 LAB — CBC WITH DIFFERENTIAL/PLATELET
Abs Immature Granulocytes: 0.11 10*3/uL — ABNORMAL HIGH (ref 0.00–0.07)
Basophils Absolute: 0 10*3/uL (ref 0.0–0.1)
Basophils Relative: 0 %
Eosinophils Absolute: 0.2 10*3/uL (ref 0.0–0.5)
Eosinophils Relative: 2 %
HCT: 28.3 % — ABNORMAL LOW (ref 39.0–52.0)
Hemoglobin: 9.4 g/dL — ABNORMAL LOW (ref 13.0–17.0)
Immature Granulocytes: 1 %
Lymphocytes Relative: 9 %
Lymphs Abs: 0.9 10*3/uL (ref 0.7–4.0)
MCH: 31.9 pg (ref 26.0–34.0)
MCHC: 33.2 g/dL (ref 30.0–36.0)
MCV: 95.9 fL (ref 80.0–100.0)
Monocytes Absolute: 0.8 10*3/uL (ref 0.1–1.0)
Monocytes Relative: 8 %
Neutro Abs: 8.1 10*3/uL — ABNORMAL HIGH (ref 1.7–7.7)
Neutrophils Relative %: 80 %
Platelets: 213 10*3/uL (ref 150–400)
RBC: 2.95 MIL/uL — ABNORMAL LOW (ref 4.22–5.81)
RDW: 12.7 % (ref 11.5–15.5)
WBC: 10.1 10*3/uL (ref 4.0–10.5)
nRBC: 0 % (ref 0.0–0.2)

## 2022-04-21 LAB — BASIC METABOLIC PANEL
Anion gap: 13 (ref 5–15)
BUN: 25 mg/dL — ABNORMAL HIGH (ref 8–23)
CO2: 25 mmol/L (ref 22–32)
Calcium: 7.9 mg/dL — ABNORMAL LOW (ref 8.9–10.3)
Chloride: 102 mmol/L (ref 98–111)
Creatinine, Ser: 4.39 mg/dL — ABNORMAL HIGH (ref 0.61–1.24)
GFR, Estimated: 14 mL/min — ABNORMAL LOW (ref >60)
Glucose, Bld: 95 mg/dL (ref 70–99)
Potassium: 3.5 mmol/L (ref 3.5–5.1)
Sodium: 140 mmol/L (ref 135–145)

## 2022-04-21 LAB — GLUCOSE, CAPILLARY
Glucose-Capillary: 88 mg/dL (ref 70–99)
Glucose-Capillary: 102 mg/dL — ABNORMAL HIGH (ref 70–99)
Glucose-Capillary: 105 mg/dL — ABNORMAL HIGH (ref 70–99)
Glucose-Capillary: 94 mg/dL (ref 70–99)

## 2022-04-21 LAB — VANCOMYCIN, RANDOM: Vancomycin Rm: 14 ug/mL

## 2022-04-21 MED ORDER — VANCOMYCIN HCL IN DEXTROSE 1-5 GM/200ML-% IV SOLN
1000.0000 mg | Freq: Once | INTRAVENOUS | Status: AC
  Administered 2022-04-21: 1000 mg via INTRAVENOUS
  Filled 2022-04-21: qty 200

## 2022-04-21 NOTE — Progress Notes (Signed)
Progress Note   Patient: Casey French [id number] DOB: 09/28/1950 DOA: 04/18/2022     3 DOS: the patient was seen and examined on 04/21/2022     Subjective:  Patient seen and examined at bedside this morning Patient has no more abdominal pain Given his bacteremia infectious disease has been consulted Denies nausea vomiting chest pain or cough     Brief hospital course: Casey French is a 72 y.o. male with medical history significant of coronary artery disease, diabetes, GERD, history of GI bleed, essential hypertension, hyperlipidemia, gout, depression, persistent atrial fibrillation on chronic anticoagulation who was brought in from home with nausea vomiting diarrhea as well as abdominal pain. Patient came to the ER where his French pressure was 200/110.   Imaging showed acute uncomplicated sigmoid diverticulitis.       Assessment and Plan:   #acute uncomplicated sigmoid diverticulitis: Patient with multiple comorbidities.   Continue current antibiotic therapy Continue pain management and nausea management.   Advancement of diet as tolerated   # Gram-positive bacteremia Patient received IV vancomycin dosing being followed by pharmacist awaiting repeat cultures Cefepime has been changed to ceftriaxone Nephrologist plans to complete 3 weeks course of ceftriaxone to cover for possible peritonitis in the setting of peritoneal dialysis catheter being in place Infectious disease consulted we appreciate input  # end-stage renal disease on peritoneal dialysis:  Consult ed nephrology for dialysis needs   #hypokalemia: Continue repletion and monitoring   # Syncopal episode likely secondary to above Treatment as above    # lactic acidosis: Most likely as a result of infection. Patient received a course of IV fluid which is currently discontinued   # diabetes:  Continue sliding scale insulin.   # GERD: Continue PPIs.   # hypertensive urgency: Systolic of [id number] on  presentation.   French pressure improved  Continue IV hydralazine or labetalol. Continue metoprolol, isosorbide mononitrate Monitor French pressure closely   # hyperlipidemia: Continue statin   #coronary artery disease: No acute decompensation.    Advance Care Planning:   Code Status: Full Code    Consults: Nephrology consulted as well as clinical pharmacist   Family Communication: Wife     Physical Exam: Constitutional: Elderly male lying in bed, in no acute distress Eyes: PERRL, lids and conjunctivae normal ENMT: Mucous membranes are moist.  Neck: normal, supple, no masses, no thyromegaly Respiratory: clear to auscultation bilaterally,  Cardiovascular: Regular rate and rhythm,  Abdomen: Abdominal tenderness resolved no masses palpated. Peritoneal dialysis catheter in place Musculoskeletal: Good range of motion, no joint swelling or tenderness, Skin: no rashes, lesions, ulcers. No induration Neurologic: CN 2-12 grossly intact.      Data Reviewed: CBC and BMP  reviewed by me today showing improvement hypokalemia with potassium of 3.5     Family Communication: No family member at bedside   Disposition: Status is: Inpatient Team continues to meet inpatient criteria as he is currently requiring IV antibiotics    Planned Discharge Destination: Prior living condition   Time spent: 40 minutes      Physical Exam: Vitals:   04/21/22 0500 04/21/22 0747 04/21/22 0837 04/21/22 1452  BP:  (!) 141/72 136/84 135/81  Pulse:  72 69 68  Resp:  18 18 18   Temp:  97.8 F (36.6 C) 98 F (36.7 C) 98.1 F (36.7 C)  TempSrc:  Oral Oral Oral  SpO2:  96% 97% 97%  Weight: 65.7 kg     Height:  Author: Verline Lema, MD 04/21/2022 3:44 PM  For on call review [email or web address].

## 2022-04-21 NOTE — Care Management Important Message (Signed)
Important Message  Patient Details  Name: Casey French MRN: [id number] Date of Birth: 1950-05-01   Medicare Important Message Given:  Yes     Dannette Barbara 04/21/2022, 11:59 AM

## 2022-04-21 NOTE — Consult Note (Signed)
NAME: Casey French  DOB: 09-24-1950  MRN: [id number]  Date/Time: 04/21/2022 11:32 AM  REQUESTING PROVIDER: Dr.Djan Subjective:  REASON FOR CONSULT: staph hominis bacteremia ?pts wife at bedside- history from patient, wife and chart Casey French is a 72 y.o. 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 Preceding week he had many episodes of diarrhea In the Ed vitals  04/18/22  BP 178/83 !  Temp 97.9 F (36.6 C)  Pulse Rate 75  Resp 29 !  SpO2 97 %  Weight 145 lb    Latest Reference Range & Units 04/18/22  WBC 4.0 - 10.5 K/uL 9.2  Hemoglobin 13.0 - 17.0 g/dL 12.3 (L)  HCT 39.0 - 52.0 % 37.0 (L)  Platelets 150 - 400 K/uL 288  Creatinine 0.61 - 1.24 mg/dL 5.28 (H)   CXR no infiltrate 2 sets of blood culture sent CT abdomen showed diverticulitis of sigmoid colon/descending colon Started broad spectrum antibiotic Blood culture positive for staph hominis and I am seeing the patient for the same Peritoneal dialysis fluid was   Latest Reference Range & Units 04/19/22 18:54  Color, Fluid YELLOW  YELLOW  Total Nucleated Cell Count, Fluid cu mm 248  Fluid Type-FCT  Peritoneal  Lymphs, Fluid % 17  Eos, Fluid % 0  Appearance, Fluid CLEAR  CLEAR  Neutrophil Count, Fluid % 34  Monocyte-Macrophage-Serous Fluid % 49  Culture pending  Blood culture positive for staph hominis and I am seeing the patient for the same Pt has mid thoracic back pain which gets worse with deep breathing or movt Left hip arthroplasty - has no pain in the hip joints  Past Medical History:  Diagnosis Date   Acute diverticulitis 06/14/2014   Anemia    Anginal pain (HCC)    Aortic atherosclerosis (HCC)    Atrial fibrillation (HCC)    CKD (chronic kidney disease) stage 3, GFR 30-59 ml/min (HCC)    Coronary artery disease    Depression    Diabetes mellitus without complication (HCC)    Diverticulosis    GERD  (gastroesophageal reflux disease)    GI bleed 05/15/2019   Gout    Heart murmur    Hematemesis 05/15/2019   High cholesterol    Hypertension    STEMI (ST elevation myocardial infarction) ([place]) 2003   Inferior    Past Surgical History:  Procedure Laterality Date   CAPD INSERTION N/A 08/13/2020   Procedure: LAPAROSCOPIC INSERTION CONTINUOUS AMBULATORY PERITONEAL DIALYSIS  (CAPD) CATHETER;  Surgeon: Ronny Bacon, MD;  Location: [facility] ORS;  Service: General;  Laterality: N/A;   CARDIAC CATHETERIZATION     CATARACT EXTRACTION W/PHACO Right 07/05/2019   Procedure: CATARACT EXTRACTION PHACO AND INTRAOCULAR LENS PLACEMENT ([place]) RIGHT DIABETIC;  Surgeon: Birder Robson, MD;  Location: [facility] OR[facility];  Service: Ophthalmology;  Laterality: Right;  US00:59.3 CDE8.41 LOT [id number]   COLONOSCOPY WITH PROPOFOL N/A 04/01/2017   Procedure: COLONOSCOPY WITH PROPOFOL;  Surgeon: Lollie Sails, MD;  Location: [facility];  Service: Endoscopy;  Laterality: N/A;   CORONARY ANGIOPLASTY WITH STENT PLACEMENT  10/2001   RCA   CORONARY ANGIOPLASTY WITH STENT PLACEMENT  07/2015   LAD   right hand surgery  2015   3 screws in right hand   TOTAL HIP ARTHROPLASTY Left 04/22/2020   Procedure: TOTAL HIP ARTHROPLASTY ANTERIOR APPROACH;  Surgeon: Hessie Knows, MD;  Location: [facility] ORS;  Service: Orthopedics;  Laterality: Left;  Social History   Socioeconomic History   Marital status: Married    Spouse name: Sherri   Number of children: 2   Years of education: Not on file   Highest education level: Not on file  Occupational History   Not on file  Tobacco Use   Smoking status: Former    Types: Cigarettes    Quit date: 03/24/2001    Years since quitting: 21.0   Smokeless tobacco: Never  Vaping Use   Vaping Use: Never used  Substance and Sexual Activity   Alcohol use: No    Alcohol/week: 0.0 standard drinks of alcohol   Drug use: No   Sexual activity: Not on file  Other Topics Concern   Not on file   Social History Narrative   Not on file   Social Determinants of Health   Financial Resource Strain: Not on file  Food Insecurity: No Food Insecurity (04/19/2022)   Hunger Vital Sign    Worried About Running Out of Food in the Last Year: Never true    Ran Out of Food in the Last Year: Never true  Transportation Needs: No Transportation Needs (04/19/2022)   PRAPARE - Hydrologist (Medical): No    Lack of Transportation (Non-Medical): No  Physical Activity: Not on file  Stress: Not on file  Social Connections: Not on file  Intimate Partner Violence: Not At Risk (04/19/2022)   Humiliation, Afraid, Rape, and Kick questionnaire    Fear of Current or Ex-Partner: No    Emotionally Abused: No    Physically Abused: No    Sexually Abused: No    Family History  Problem Relation Age of Onset   Heart disease Mother    Heart disease Father    CAD Other    Allergies  Allergen Reactions   Morphine Anxiety    agitation    I? Current Facility-Administered Medications  Medication Dose Route Frequency Provider Last Rate Last Admin   apixaban (ELIQUIS) tablet 2.5 mg  2.5 mg Oral BID Gala Romney L, MD   2.5 mg at 04/21/22 0911   calcitRIOL (ROCALTROL) capsule 0.25 mcg  0.25 mcg Oral Daily Colon Flattery, NP   0.25 mcg at 04/21/22 [id number]   calcium carbonate (TUMS - dosed in mg elemental calcium) chewable tablet 400 mg of elemental calcium  2 tablet Oral Daily Colon Flattery, NP   400 mg of elemental calcium at 04/21/22 0909   ceFEPIme (MAXIPIME) 1,200 mg in dialysis solution 1.5% low-MG/low-CA 3,000 mL dialysis solution   Peritoneal Dialysis Daily Berton Mount, [facility]   New Bag at 04/20/22 1530   dialysis solution 1.5% low-MG/low-CA dianeal solution   Intraperitoneal Q24H Colon Flattery, NP 0 mL/hr at 04/19/22 1900 1,000 mL at 04/19/22 1900   gabapentin (NEURONTIN) capsule 300 mg  300 mg Oral QHS Gala Romney L, MD   300 mg at 04/20/22 2118   gentamicin  cream (GARAMYCIN) 0.1 % 1 Application  1 Application Topical Daily Colon Flattery, NP   1 Application at 0000000 0945   insulin aspart (novoLOG) injection 0-5 Units  0-5 Units Subcutaneous QHS Garba, Mohammad L, MD       insulin aspart (novoLOG) injection 0-9 Units  0-9 Units Subcutaneous TID WC Elwyn Reach, MD   2 Units at 04/20/22 1726   isosorbide mononitrate (IMDUR) 24 hr tablet 60 mg  60 mg Oral Daily Elwyn Reach, MD   60 mg at 04/21/22 0911   metoprolol succinate (TOPROL-XL)  24 hr tablet 100 mg  100 mg Oral Daily Gala Romney L, MD   100 mg at 04/21/22 0946   metroNIDAZOLE (FLAGYL) IVPB 500 mg  500 mg Intravenous Q12H Gala Romney L, MD 100 mL/hr at 04/21/22 1103 500 mg at 04/21/22 1103   morphine (PF) 2 MG/ML injection 2 mg  2 mg Intravenous Q2H PRN Elwyn Reach, MD   2 mg at 04/21/22 0943   nitroGLYCERIN (NITROSTAT) SL tablet 0.4 mg  0.4 mg Sublingual Q5 min PRN Elwyn Reach, MD       ondansetron (ZOFRAN) tablet 4 mg  4 mg Oral Q6H PRN Elwyn Reach, MD       Or   ondansetron (ZOFRAN) injection 4 mg  4 mg Intravenous Q6H PRN Elwyn Reach, MD   4 mg at 04/20/22 2320   pantoprazole (PROTONIX) EC tablet 40 mg  40 mg Oral Daily Elwyn Reach, MD   40 mg at 04/21/22 0911   psyllium (HYDROCIL/METAMUCIL) 1 packet  1 packet Oral BID Elwyn Reach, MD   1 packet at 04/21/22 0912   rosuvastatin (CRESTOR) tablet 40 mg  40 mg Oral Daily Elwyn Reach, MD   40 mg at 04/21/22 0911   vancomycin variable dose per unstable renal function (pharmacist dosing)   Does not apply See admin instructions Berton Mount, RPH         Abtx:  Anti-infectives (From admission, onward)    Start     Dose/Rate Route Frequency Ordered Stop   04/20/22 1415  ceFEPIme (MAXIPIME) 1,000 mg in dialysis solution 1.5% low-MG/low-CA 2,500 mL dialysis solution  Status:  Discontinued         Peritoneal Dialysis Daily 04/20/22 1320 04/20/22 1334   04/20/22 1415  ceFEPIme  (MAXIPIME) 1,200 mg in dialysis solution 1.5% low-MG/low-CA 3,000 mL dialysis solution         Peritoneal Dialysis Daily 04/20/22 1334     04/20/22 0000  vancomycin variable dose per unstable renal function (pharmacist dosing)         Does not apply See admin instructions 04/19/22 1551     04/19/22 1700  vancomycin (VANCOREADY) IVPB 1500 mg/300 mL        1,500 mg 150 mL/hr over 120 Minutes Intravenous  Once 04/19/22 1551 04/20/22 0956   04/19/22 1100  metroNIDAZOLE (FLAGYL) IVPB 500 mg        500 mg 100 mL/hr over 60 Minutes Intravenous Every 12 hours 04/18/22 2338     04/19/22 1000  cefTRIAXone (ROCEPHIN) 2 g in sodium chloride 0.9 % 100 mL IVPB  Status:  Discontinued        2 g 200 mL/hr over 30 Minutes Intravenous Every 24 hours 04/18/22 2338 04/20/22 1332   04/18/22 2045  cefTRIAXone (ROCEPHIN) 1 g in sodium chloride 0.9 % 100 mL IVPB        1 g 200 mL/hr over 30 Minutes Intravenous  Once 04/18/22 2031 04/18/22 2152   04/18/22 2045  metroNIDAZOLE (FLAGYL) IVPB 500 mg        500 mg 100 mL/hr over 60 Minutes Intravenous  Once 04/18/22 2031 04/18/22 2356       REVIEW OF SYSTEMS:  Const: negative fever, negative chills, negative weight loss Eyes: negative diplopia or visual changes, negative eye pain ENT: negative coryza, negative sore throat Resp: negative cough, hemoptysis, dyspnea Cards: negative for chest pain, palpitations, lower extremity edema GU: negative for frequency, dysuria and hematuria GI: lower abdominal pain,  nausea Diarrhea a week before Skin: negative for rash and pruritus Heme: negative for easy bruising and gum/nose bleeding MS: mid back pain Neurolo:negative for headaches, dizziness, vertigo, memory problems  Psych: negative for feelings of anxiety, depression  Endocrine: , diabetes Allergy/Immunology- morphine Objective:  VITALS:  BP 136/84 (BP Location: Right Arm)   Pulse 69   Temp 98 F (36.7 C) (Oral)   Resp 18   Ht 5\' 9"  (1.753 m)   Wt 65.7 kg    SpO2 97%   BMI 21.39 kg/m  LDA PD cath PHYSICAL EXAM:  General: Alert, cooperative, no distress,  Head: Normocephalic, without obvious abnormality, atraumatic. Eyes: Conjunctivae clear, anicteric sclerae. Pupils are equal ENT Nares normal. No drainage or sinus tenderness. Lips, mucosa, and tongue normal. No Thrush Neck: Supple, symmetrical, no adenopathy, thyroid: non tender no carotid bruit and no JVD.  Lungs: b/l air entry Heart: Regular rate and rhythm, no murmur, rub or gallop. Abdomen: Soft, non-tender,not distended. Bowel sounds normal. No masses, PD cath left side Extremities: atraumatic, no cyanosis. No edema. No clubbing Skin: No rashes or lesions. Or bruising Lymph: Cervical, supraclavicular normal. Neurologic: Grossly non-focal Pertinent Labs Lab Results CBC    Component Value Date/Time   WBC 10.1 04/21/2022 0604   RBC 2.95 (L) 04/21/2022 0604   HGB 9.4 (L) 04/21/2022 0604   HGB 13.0 03/23/2013 2250   HCT 28.3 (L) 04/21/2022 0604   HCT 38.5 (L) 03/23/2013 2250   PLT 213 04/21/2022 0604   PLT 227 03/23/2013 2250   MCV 95.9 04/21/2022 0604   MCV 92 03/23/2013 2250   MCH 31.9 04/21/2022 0604   MCHC 33.2 04/21/2022 0604   RDW 12.7 04/21/2022 0604   RDW 13.1 03/23/2013 2250   LYMPHSABS 0.9 04/21/2022 0604   LYMPHSABS 2.8 03/23/2013 2250   MONOABS 0.8 04/21/2022 0604   MONOABS 0.8 03/23/2013 2250   EOSABS 0.2 04/21/2022 0604   EOSABS 0.1 03/23/2013 2250   BASOSABS 0.0 04/21/2022 0604   BASOSABS 0.1 03/23/2013 2250       Latest Ref Rng & Units 04/21/2022    6:04 AM 04/20/2022    6:00 AM 04/19/2022    5:06 AM  CMP  Glucose 70 - 99 mg/dL 95  94  102   BUN 8 - 23 mg/dL 25  35  44   Creatinine 0.61 - 1.24 mg/dL 4.39  4.80  5.58   Sodium 135 - 145 mmol/L 140  137  140   Potassium 3.5 - 5.1 mmol/L 3.5  2.9  3.9   Chloride 98 - 111 mmol/L 102  107  107   CO2 22 - 32 mmol/L 25  23  21    Calcium 8.9 - 10.3 mg/dL 7.9  7.5  8.5   Total Protein 6.5 - 8.1 g/dL    5.9   Total Bilirubin 0.3 - 1.2 mg/dL   0.4   Alkaline Phos 38 - 126 U/L   103   AST 15 - 41 U/L   18   ALT 0 - 44 U/L   12       Microbiology: Recent Results (from the past 240 hour(s))  Resp panel by RT-PCR (RSV, Flu A&B, Covid) Anterior Nasal Swab     Status: None   Collection Time: 04/18/22  6:14 PM   Specimen: Anterior Nasal Swab  Result Value Ref Range Status   SARS Coronavirus 2 by RT PCR NEGATIVE NEGATIVE Final    Comment: (NOTE) SARS-CoV-2 target nucleic acids are NOT DETECTED.  The SARS-CoV-2 RNA is generally detectable in upper respiratory specimens during the acute phase of infection. The lowest concentration of SARS-CoV-2 viral copies this assay can detect is 138 copies/mL. A negative result does not preclude SARS-Cov-2 infection and should not be used as the sole basis for treatment or other patient management decisions. A negative result may occur with  improper specimen collection/handling, submission of specimen other than nasopharyngeal swab, presence of viral mutation(s) within the areas targeted by this assay, and inadequate number of viral copies(<138 copies/mL). A negative result must be combined with clinical observations, patient history, and epidemiological information. The expected result is Negative.  Fact Sheet for Patients:  EntrepreneurPulse.com.au  Fact Sheet for Healthcare Providers:  IncredibleEmployment.be  This test is no t yet approved or cleared by the [place] FDA and  has been authorized for detection and/or diagnosis of SARS-CoV-2 by FDA under an Emergency Use Authorization (EUA). This EUA will remain  in effect (meaning this test can be used) for the duration of the COVID-19 declaration under Section 564(b)(1) of the Act, 21 U.S.C.section 360bbb-3(b)(1), unless the authorization is terminated  or revoked sooner.       Influenza A by PCR NEGATIVE NEGATIVE Final   Influenza B by PCR  NEGATIVE NEGATIVE Final    Comment: (NOTE) The Xpert Xpress SARS-CoV-2/FLU/RSV plus assay is intended as an aid in the diagnosis of influenza from Nasopharyngeal swab specimens and should not be used as a sole basis for treatment. Nasal washings and aspirates are unacceptable for Xpert Xpress SARS-CoV-2/FLU/RSV testing.  Fact Sheet for Patients: EntrepreneurPulse.com.au  Fact Sheet for Healthcare Providers: IncredibleEmployment.be  This test is not yet approved or cleared by the [place] FDA and has been authorized for detection and/or diagnosis of SARS-CoV-2 by FDA under an Emergency Use Authorization (EUA). This EUA will remain in effect (meaning this test can be used) for the duration of the COVID-19 declaration under Section 564(b)(1) of the Act, 21 U.S.C. section 360bbb-3(b)(1), unless the authorization is terminated or revoked.     Resp Syncytial Virus by PCR NEGATIVE NEGATIVE Final    Comment: (NOTE) Fact Sheet for Patients: EntrepreneurPulse.com.au  Fact Sheet for Healthcare Providers: IncredibleEmployment.be  This test is not yet approved or cleared by the [place] FDA and has been authorized for detection and/or diagnosis of SARS-CoV-2 by FDA under an Emergency Use Authorization (EUA). This EUA will remain in effect (meaning this test can be used) for the duration of the COVID-19 declaration under Section 564(b)(1) of the Act, 21 U.S.C. section 360bbb-3(b)(1), unless the authorization is terminated or revoked.  Performed at [facility], [address], [place], [place] [address]   Blood Culture (routine x 2)     Status: Abnormal (Preliminary result)   Collection Time: 04/18/22  6:40 PM   Specimen: BLOOD  Result Value Ref Range Status   Specimen Description   Final    BLOOD BLOOD RIGHT HAND Performed at [facility], [address], [place], [place] [address]     Special Requests   Final    BOTTLES DRAWN AEROBIC AND ANAEROBIC Blood Culture adequate volume Performed at [facility], [address] [place], [place] [address]    Culture  Setup Time   Final    GRAM POSITIVE COCCI IN BOTH AEROBIC AND ANAEROBIC BOTTLES CRITICAL RESULT CALLED TO, READ BACK BY AND VERIFIED WITH: C/DEVON MITCHELL 04/19/22 0924 SLM    Culture (A)  Final    STAPHYLOCOCCUS HOMINIS SUSCEPTIBILITIES TO FOLLOW  Performed at [facility], [place] [address], [place], [place] [address]    Report Status PENDING  Incomplete  Blood Culture (routine x 2)     Status: Abnormal (Preliminary result)   Collection Time: 04/18/22  6:40 PM   Specimen: BLOOD  Result Value Ref Range Status   Specimen Description   Final    BLOOD BLOOD LEFT ARM Performed at [facility], [address], [place], [place] [address]    Special Requests   Final    BOTTLES DRAWN AEROBIC AND ANAEROBIC Blood Culture adequate volume Performed at [facility], [address] [place], [place] [address]    Culture  Setup Time   Final    GRAM POSITIVE COCCI AEROBIC BOTTLE ONLY CRITICAL VALUE NOTED.  VALUE IS CONSISTENT WITH PREVIOUSLY REPORTED AND CALLED VALUE. Performed at [facility], [address] [place], [place] [address]    Culture STAPHYLOCOCCUS HOMINIS (A)  Final   Report Status PENDING  Incomplete  Blood Culture ID Panel (Reflexed)     Status: Abnormal   Collection Time: 04/18/22  6:40 PM  Result Value Ref Range Status   Enterococcus faecalis NOT DETECTED NOT DETECTED Final   Enterococcus Faecium NOT DETECTED NOT DETECTED Final   Listeria monocytogenes NOT DETECTED NOT DETECTED Final   Staphylococcus species DETECTED (A) NOT DETECTED Final    Comment: CRITICAL RESULT CALLED TO, READ BACK BY AND VERIFIED WITH: C/DEVON MITCHELL 04/19/22 0924 SLM    Staphylococcus aureus (BCID) NOT DETECTED NOT DETECTED Final   Staphylococcus epidermidis NOT DETECTED NOT  DETECTED Final   Staphylococcus lugdunensis NOT DETECTED NOT DETECTED Final   Streptococcus species NOT DETECTED NOT DETECTED Final   Streptococcus agalactiae NOT DETECTED NOT DETECTED Final   Streptococcus pneumoniae NOT DETECTED NOT DETECTED Final   Streptococcus pyogenes NOT DETECTED NOT DETECTED Final   A.calcoaceticus-baumannii NOT DETECTED NOT DETECTED Final   Bacteroides fragilis NOT DETECTED NOT DETECTED Final   Enterobacterales NOT DETECTED NOT DETECTED Final   Enterobacter cloacae complex NOT DETECTED NOT DETECTED Final   Escherichia coli NOT DETECTED NOT DETECTED Final   Klebsiella aerogenes NOT DETECTED NOT DETECTED Final   Klebsiella oxytoca NOT DETECTED NOT DETECTED Final   Klebsiella pneumoniae NOT DETECTED NOT DETECTED Final   Proteus species NOT DETECTED NOT DETECTED Final   Salmonella species NOT DETECTED NOT DETECTED Final   Serratia marcescens NOT DETECTED NOT DETECTED Final   Haemophilus influenzae NOT DETECTED NOT DETECTED Final   Neisseria meningitidis NOT DETECTED NOT DETECTED Final   Pseudomonas aeruginosa NOT DETECTED NOT DETECTED Final   Stenotrophomonas maltophilia NOT DETECTED NOT DETECTED Final   Candida albicans NOT DETECTED NOT DETECTED Final   Candida auris NOT DETECTED NOT DETECTED Final   Candida glabrata NOT DETECTED NOT DETECTED Final   Candida krusei NOT DETECTED NOT DETECTED Final   Candida parapsilosis NOT DETECTED NOT DETECTED Final   Candida tropicalis NOT DETECTED NOT DETECTED Final   Cryptococcus neoformans/gattii NOT DETECTED NOT DETECTED Final    Comment: Performed at [facility], [address] [place],  [address]  Culture, blood (Routine X 2) w Reflex to ID Panel     Status: None (Preliminary result)   Collection Time: 04/19/22  3:56 PM   Specimen: BLOOD  Result Value Ref Range Status   Specimen Description BLOOD BLOOD RIGHT ARM  Final   Special Requests   Final    BOTTLES DRAWN AEROBIC AND ANAEROBIC Blood Culture  results may not be  optimal due to an excessive volume of blood received in culture bottles   Culture   Final    NO GROWTH 2 DAYS Performed at [facility], [address] [place], [place] [address]    Report Status PENDING  Incomplete  Culture, blood (Routine X 2) w Reflex to ID Panel     Status: None (Preliminary result)   Collection Time: 04/19/22  3:58 PM   Specimen: BLOOD  Result Value Ref Range Status   Specimen Description BLOOD BLOOD RIGHT WRIST  Final   Special Requests   Final    BOTTLES DRAWN AEROBIC AND ANAEROBIC Blood Culture results may not be optimal due to an inadequate volume of blood received in culture bottles   Culture   Final    NO GROWTH 2 DAYS Performed at [facility], [address], [place], [place] [address]    Report Status PENDING  Incomplete  Body fluid culture w Gram Stain     Status: None (Preliminary result)   Collection Time: 04/19/22  6:54 PM   Specimen: Peritoneal Washings; Body Fluid  Result Value Ref Range Status   Specimen Description   Final    PERITONEAL Performed at [facility], [address] [place], [place] [address]    Special Requests   Final    PERITONEAL Performed at [facility], [address] [place], [place] [address]    Gram Stain   Final    RARE WBC PRESENT, PREDOMINANTLY MONONUCLEAR NO ORGANISMS SEEN CYTOSPIN SMEAR    Culture   Final    NO GROWTH 1 DAY Performed at [facility], [place] [address] [place], [place] [address]    Report Status PENDING  Incomplete    IMAGING RESULTS: Ct abdomen and pelvis Some mild pericolonic inflammatory changes are seen consistent with early diverticulitis in the sigmoid colon. I have personally reviewed the films ? Impression/Recommendation ? Diverticulitis of sigmoid - uncomplicated Pt currently on cefepime thru peritoneal dialysis catheter? On IV flagyl  Staph hominis bacteremia both sets- peritoneal fluid cell count bland-  culture pending. This organism is a skin bacteria and can be a contaminant, but in patents with PD catheter may be a true pathogen Pt c/o thoracic mid back pain-  Also has low back pain Because of bacteremia Need MRI of the thoracic and lumbar spine Continue IV vancomycin  ESRD on PD  Anemia  DM on sliding scale ? _HTN on admission was very high. Management as per primary team  H/o L THA __________________________________________________ Discussed with patient, and wife in detail Note:  This document was prepared using Dragon voice recognition software and may include unintentional dictation errors.

## 2022-04-21 NOTE — Progress Notes (Addendum)
Pharmacy Antibiotic Note  Casey French is a 72 y.o. male admitted on 04/18/2022 with possible bacteremia.  Patient presented with N/V/D and abdominal pain.  CT revealed mild diveriticulitis and patient started on ceftriaxone and metronidazole.  Pharmacy has been consulted for vancomycin dosing for possible bacteremia (staphylococcus species) as well as intra-peritoneal cefepime dosing.  Today, 04/21/2022 Day 4 antibiotics - ceftriaxone to cefepime IP plus metronidazole Day 3 vancomycin  Renal: ESRD on PD WBC WNL Afebrile 3/24 blood cultures initially 1/4 bottles GPC, but now 3/4 bottles with GPC, S. Hominis (susc pending) Note both sets drawn at same time, ?? Drawn from same site (despite documentation saying from two different sites) Nephrology ordering peritoneal fluid analysis and culture - fluid analysis not glaring for peritonitis but nephrology concerned for translocation of bacteria in setting of diverticulitis.   3/25 peritoneal fluid analysis: 248 Nucleated cells, 34% neutrophils Vancomycin random level 2 06:04 3/27 = 14 (vancomycin 1500mg  IV x 1 given 3/25 at 16:43) ID consulted 3/27  Plan: While awaiting S. Hominis susceptibilities, continue Vancomycin IV.  Based on today's random vancomycin level, give vancomycin 1 gm IV x1 today.  Discussed with ID and prefer IV over intraperitoneal antibiotics at this time  If S hominis is methicillin resistant, likely represents contaminant as repeat blood cx are NGTD and were drawn prior to start of vancomycin  Continue cefepime 1200mg  added to a 3L dialysate bag with patient receiving 2.5L. Patient will receive cefepime 1gm w/ the 2.5L of dialysate.  Follow-up culture results  Height: 5\' 9"  (175.3 cm) Weight: 65.7 kg (144 lb 13.5 oz) IBW/kg (Calculated) : 70.7  Temp (24hrs), Avg:97.9 F (36.6 C), Min:97.7 F (36.5 C), Max:98 F (36.7 C)  Recent Labs  Lab 04/18/22 1809 04/18/22 1840 04/18/22 2030 04/19/22 0506 04/20/22 0600  04/21/22 0604  WBC 9.2  --   --  9.4 9.6 10.1  CREATININE 5.28*  --   --  5.58* 4.80* 4.39*  LATICACIDVEN  --  2.4* 1.5  --   --   --   VANCORANDOM  --   --   --   --   --  14     Estimated Creatinine Clearance: 14.3 mL/min (A) (by C-G formula based on SCr of 4.39 mg/dL (H)).    Allergies  Allergen Reactions   Morphine Anxiety    agitation     Antimicrobials this admission: 3/24 ceftriaxone >>3/26 3/24 metronidazole >> 3/25 vancomycin >> 3/26 cefepime IP >>  Dose adjustments this admission:   Microbiology results: 3/24 BCx: 3/4 bottles with GPC- S hominis in both sets 3/25 bcx: NGTD 3/25 peritoneal fluid:NG  Thank you for allowing pharmacy to be a part of this patient's care.  Doreene Eland, PharmD, BCPS, BCIDP Work Cell: [phone] 04/21/2022 2:32 PM

## 2022-04-21 NOTE — Progress Notes (Signed)
Peritoneal Dialysis Treatment Initiation Note   Pre Treatment Weight: 60.8 kg  Consent signed and in chart.  PD treatment initiated via aseptic technique.   Patient is awake and alert. No complaints of pain.   PD exit site clean, dry and intact.  Gentamicin and new dressing applied.   Hand-off given to the patient's nurse.  Education provided to dept staff  regarding PD machine and how  to contact tech support if machine  alarms.    [place] Constable RN Kidney Dialysis Unit

## 2022-04-21 NOTE — Progress Notes (Signed)
PD post treatment note  PD treatment completed. Patient tolerated treatment well. PD effluent is clear. No specimen collected.  PD exit site clean, dry and intact. Patient is awake, oriented and in no acute distress.  Report given to bedside nurse.   Post treatment VS within patient baseline.   Total UF removed:  633ml   Post treatment weight: 62.1 kg.

## 2022-04-21 NOTE — Progress Notes (Signed)
[facility]  ROUNDING NOTE   Subjective:   Casey French is a 72 year old male with past medical conditions including GERD, diabetes, CAD, hypertension, gout, hyperlipidemia, atrial fibrillation, and end-stage renal disease on peritoneal dialysis.  Patient presents to the emergency department with complaints of nausea, vomiting, diarrhea, and abdominal pain.  Patient has been admitted for Diverticulitis [K57.92] Acute diverticulitis [K57.92]  Patient is known to our practice and is followed by [facility], Dr. Juleen China for peritoneal dialysis.    Patient seen sitting up in bed, breakfast tray at bedside Patient reports "getting sick" yesterday Continues to deny diarrhea since admission  PD treatment went well overnight  Objective:  Vital signs in last 24 hours:  Temp:  [97.7 F (36.5 C)-98 F (36.7 C)] 98 F (36.7 C) (03/27 0837) Pulse Rate:  [69-76] 69 (03/27 0837) Resp:  [16-20] 18 (03/27 0837) BP: (136-148)/(72-85) 136/84 (03/27 0837) SpO2:  [96 %-97 %] 97 % (03/27 0837) Weight:  [64.1 kg-65.7 kg] 65.7 kg (03/27 0500)  Weight change: 2.5 kg Filed Weights   04/20/22 0500 04/20/22 1849 04/21/22 0500  Weight: 65.8 kg 64.1 kg 65.7 kg    Intake/Output: I/O last 3 completed shifts: In: [id number] [P.O.:1080; I.V.:545; IV Piggyback:100] Out: 2387 [id number]; Other:437]   Intake/Output this shift:  Total I/O In: 240 [P.O.:240] Out: 637 [Other:637]  Physical Exam: General: NAD  Head: Normocephalic, atraumatic. Dry oral mucosal membranes  Eyes: Anicteric  Lungs:  Clear to auscultation, normal effort  Heart: Regular rate and rhythm  Abdomen:  Soft, nontender. [place]  Extremities:  No peripheral edema.  Neurologic: Alert and oriented, moving all four extremities  Skin: No lesions  Access: [facility]    Basic Metabolic Panel: Recent Labs  Lab 04/18/22 1809 04/18/22 1825 04/19/22 0506 04/20/22 0600 04/21/22 0604  NA 135  --  140 137 140  K 3.3*  --  3.9  2.9* 3.5  CL 108  --  107 107 102  CO2 16*  --  21* 23 25  GLUCOSE 120*  --  102* 94 95  BUN 45*  --  44* 35* 25*  CREATININE 5.28*  --  5.58* 4.80* 4.39*  CALCIUM 7.8*  --  8.5* 7.5* 7.9*  MG  --  1.9  --   --   --      Liver Function Tests: Recent Labs  Lab 04/18/22 1809 04/19/22 0506  AST 26 18  ALT 14 12  ALKPHOS 124 103  BILITOT 1.1 0.4  PROT 6.8 5.9*  ALBUMIN 3.0* 2.6*    Recent Labs  Lab 04/18/22 1809  LIPASE 63*    No results for input(s): "AMMONIA" in the last 168 hours.  CBC: Recent Labs  Lab 04/18/22 1809 04/19/22 0506 04/20/22 0600 04/21/22 0604  WBC 9.2 9.4 9.6 10.1  NEUTROABS  --   --  7.3 8.1*  HGB 12.3* 10.6* 9.1* 9.4*  HCT 37.0* 31.9* 27.7* 28.3*  MCV 95.4 96.1 96.5 95.9  PLT 288 240 211 213     Cardiac Enzymes: No results for input(s): "CKTOTAL", "CKMB", "CKMBINDEX", "TROPONINI" in the last 168 hours.  BNP: Invalid input(s): "POCBNP"  CBG: Recent Labs  Lab 04/20/22 0748 04/20/22 1138 04/20/22 1633 04/20/22 2015 04/21/22 0752  GLUCAP 91 127* 154* 148* 66     Microbiology: Results for orders placed or performed during the hospital encounter of 04/18/22  Resp panel by RT-PCR (RSV, Flu A&B, Covid) Anterior Nasal Swab     Status: None   Collection Time:  04/18/22  6:14 PM   Specimen: Anterior Nasal Swab  Result Value Ref Range Status   SARS Coronavirus 2 by RT PCR NEGATIVE NEGATIVE Final    Comment: (NOTE) SARS-CoV-2 target nucleic acids are NOT DETECTED.  The SARS-CoV-2 RNA is generally detectable in upper respiratory specimens during the acute phase of infection. The lowest concentration of SARS-CoV-2 viral copies this assay can detect is 138 copies/mL. A negative result does not preclude SARS-Cov-2 infection and should not be used as the sole basis for treatment or other patient management decisions. A negative result may occur with  improper specimen collection/handling, submission of specimen other than nasopharyngeal  swab, presence of viral mutation(s) within the areas targeted by this assay, and inadequate number of viral copies(<138 copies/mL). A negative result must be combined with clinical observations, patient history, and epidemiological information. The expected result is Negative.  Fact Sheet for Patients:  EntrepreneurPulse.com.au  Fact Sheet for Healthcare Providers:  IncredibleEmployment.be  This test is no t yet approved or cleared by the [place] FDA and  has been authorized for detection and/or diagnosis of SARS-CoV-2 by FDA under an Emergency Use Authorization (EUA). This EUA will remain  in effect (meaning this test can be used) for the duration of the COVID-19 declaration under Section 564(b)(1) of the Act, 21 U.S.C.section 360bbb-3(b)(1), unless the authorization is terminated  or revoked sooner.       Influenza A by PCR NEGATIVE NEGATIVE Final   Influenza B by PCR NEGATIVE NEGATIVE Final    Comment: (NOTE) The Xpert Xpress SARS-CoV-2/FLU/RSV plus assay is intended as an aid in the diagnosis of influenza from Nasopharyngeal swab specimens and should not be used as a sole basis for treatment. Nasal washings and aspirates are unacceptable for Xpert Xpress SARS-CoV-2/FLU/RSV testing.  Fact Sheet for Patients: EntrepreneurPulse.com.au  Fact Sheet for Healthcare Providers: IncredibleEmployment.be  This test is not yet approved or cleared by the [place] FDA and has been authorized for detection and/or diagnosis of SARS-CoV-2 by FDA under an Emergency Use Authorization (EUA). This EUA will remain in effect (meaning this test can be used) for the duration of the COVID-19 declaration under Section 564(b)(1) of the Act, 21 U.S.C. section 360bbb-3(b)(1), unless the authorization is terminated or revoked.     Resp Syncytial Virus by PCR NEGATIVE NEGATIVE Final    Comment: (NOTE) Fact Sheet for  Patients: EntrepreneurPulse.com.au  Fact Sheet for Healthcare Providers: IncredibleEmployment.be  This test is not yet approved or cleared by the [place] FDA and has been authorized for detection and/or diagnosis of SARS-CoV-2 by FDA under an Emergency Use Authorization (EUA). This EUA will remain in effect (meaning this test can be used) for the duration of the COVID-19 declaration under Section 564(b)(1) of the Act, 21 U.S.C. section 360bbb-3(b)(1), unless the authorization is terminated or revoked.  Performed at [facility], [address] [place], [place] [address]   Blood Culture (routine x 2)     Status: Abnormal (Preliminary result)   Collection Time: 04/18/22  6:40 PM   Specimen: BLOOD  Result Value Ref Range Status   Specimen Description   Final    BLOOD BLOOD RIGHT HAND Performed at [facility], [address] [place], [place] [address]    Special Requests   Final    BOTTLES DRAWN AEROBIC AND ANAEROBIC Blood Culture adequate volume Performed at [facility], [address] [place], [place] [address]    Culture  Setup Time   Final  GRAM POSITIVE COCCI IN BOTH AEROBIC AND ANAEROBIC BOTTLES CRITICAL RESULT CALLED TO, READ BACK BY AND VERIFIED WITH: C/DEVON MITCHELL 04/19/22 0924 SLM    Culture (A)  Final    STAPHYLOCOCCUS HOMINIS SUSCEPTIBILITIES TO FOLLOW Performed at [facility], [place] [address] [place], [place]  [address]    Report Status PENDING  Incomplete  Blood Culture (routine x 2)     Status: Abnormal (Preliminary result)   Collection Time: 04/18/22  6:40 PM   Specimen: BLOOD  Result Value Ref Range Status   Specimen Description   Final    BLOOD BLOOD LEFT ARM Performed at [facility], [address], [place], [place] [address]    Special Requests   Final    BOTTLES DRAWN AEROBIC AND ANAEROBIC Blood Culture adequate volume Performed at [facility],  [address] [place], [place] [address]    Culture  Setup Time   Final    GRAM POSITIVE COCCI AEROBIC BOTTLE ONLY CRITICAL VALUE NOTED.  VALUE IS CONSISTENT WITH PREVIOUSLY REPORTED AND CALLED VALUE. Performed at [facility], [address] [place], [place] [address]    Culture STAPHYLOCOCCUS HOMINIS (A)  Final   Report Status PENDING  Incomplete  Blood Culture ID Panel (Reflexed)     Status: Abnormal   Collection Time: 04/18/22  6:40 PM  Result Value Ref Range Status   Enterococcus faecalis NOT DETECTED NOT DETECTED Final   Enterococcus Faecium NOT DETECTED NOT DETECTED Final   Listeria monocytogenes NOT DETECTED NOT DETECTED Final   Staphylococcus species DETECTED (A) NOT DETECTED Final    Comment: CRITICAL RESULT CALLED TO, READ BACK BY AND VERIFIED WITH: C/DEVON MITCHELL 04/19/22 0924 SLM    Staphylococcus aureus (BCID) NOT DETECTED NOT DETECTED Final   Staphylococcus epidermidis NOT DETECTED NOT DETECTED Final   Staphylococcus lugdunensis NOT DETECTED NOT DETECTED Final   Streptococcus species NOT DETECTED NOT DETECTED Final   Streptococcus agalactiae NOT DETECTED NOT DETECTED Final   Streptococcus pneumoniae NOT DETECTED NOT DETECTED Final   Streptococcus pyogenes NOT DETECTED NOT DETECTED Final   A.calcoaceticus-baumannii NOT DETECTED NOT DETECTED Final   Bacteroides fragilis NOT DETECTED NOT DETECTED Final   Enterobacterales NOT DETECTED NOT DETECTED Final   Enterobacter cloacae complex NOT DETECTED NOT DETECTED Final   Escherichia coli NOT DETECTED NOT DETECTED Final   Klebsiella aerogenes NOT DETECTED NOT DETECTED Final   Klebsiella oxytoca NOT DETECTED NOT DETECTED Final   Klebsiella pneumoniae NOT DETECTED NOT DETECTED Final   Proteus species NOT DETECTED NOT DETECTED Final   Salmonella species NOT DETECTED NOT DETECTED Final   Serratia marcescens NOT DETECTED NOT DETECTED Final   Haemophilus influenzae NOT DETECTED NOT DETECTED Final   Neisseria  meningitidis NOT DETECTED NOT DETECTED Final   Pseudomonas aeruginosa NOT DETECTED NOT DETECTED Final   Stenotrophomonas maltophilia NOT DETECTED NOT DETECTED Final   Candida albicans NOT DETECTED NOT DETECTED Final   Candida auris NOT DETECTED NOT DETECTED Final   Candida glabrata NOT DETECTED NOT DETECTED Final   Candida krusei NOT DETECTED NOT DETECTED Final   Candida parapsilosis NOT DETECTED NOT DETECTED Final   Candida tropicalis NOT DETECTED NOT DETECTED Final   Cryptococcus neoformans/gattii NOT DETECTED NOT DETECTED Final    Comment: Performed at [facility], [address] [place],  [address]  Culture, blood (Routine X 2) w Reflex to ID Panel     Status: None (Preliminary result)   Collection Time: 04/19/22  3:56 PM  Specimen: BLOOD  Result Value Ref Range Status   Specimen Description BLOOD BLOOD RIGHT ARM  Final   Special Requests   Final    BOTTLES DRAWN AEROBIC AND ANAEROBIC Blood Culture results may not be optimal due to an excessive volume of blood received in culture bottles   Culture   Final    NO GROWTH 2 DAYS Performed at [facility], [address], [place], [place] [address]    Report Status PENDING  Incomplete  Culture, blood (Routine X 2) w Reflex to ID Panel     Status: None (Preliminary result)   Collection Time: 04/19/22  3:58 PM   Specimen: BLOOD  Result Value Ref Range Status   Specimen Description BLOOD BLOOD RIGHT WRIST  Final   Special Requests   Final    BOTTLES DRAWN AEROBIC AND ANAEROBIC Blood Culture results may not be optimal due to an inadequate volume of blood received in culture bottles   Culture   Final    NO GROWTH 2 DAYS Performed at [facility], [address], [place], [place] [address]    Report Status PENDING  Incomplete  Body fluid culture w Gram Stain     Status: None (Preliminary result)   Collection Time: 04/19/22  6:54 PM   Specimen: Peritoneal Washings; Body Fluid  Result Value Ref  Range Status   Specimen Description   Final    PERITONEAL Performed at [facility], [address] [place], [place] [address]    Special Requests   Final    PERITONEAL Performed at [facility], [address] [place], [place] [address]    Gram Stain   Final    RARE WBC PRESENT, PREDOMINANTLY MONONUCLEAR NO ORGANISMS SEEN CYTOSPIN SMEAR    Culture   Final    NO GROWTH 1 DAY Performed at [facility], [place] [address] [place],  [address]    Report Status PENDING  Incomplete    Coagulation Studies: No results for input(s): "LABPROT", "INR" in the last 72 hours.  Urinalysis: Recent Labs    04/18/22 1840  COLORURINE YELLOW*  LABSPEC 1.015  PHURINE 6.0  GLUCOSEU 150*  HGBUR MODERATE*  BILIRUBINUR NEGATIVE  KETONESUR 20*  PROTEINUR >=300*  NITRITE NEGATIVE  LEUKOCYTESUR NEGATIVE       Imaging: No results found.   Medications:    ceFEPIme (MAXIPIME) 1,200 mg in dialysis solution 1.5% low-MG/low-CA 3,000 mL dialysis solution     dialysis solution 1.5% low-MG/low-CA 1,000 mL (04/19/22 1900)   metronidazole 500 mg (04/21/22 1103)    apixaban  2.5 mg Oral BID   calcitRIOL  0.25 mcg Oral Daily   calcium carbonate  2 tablet Oral Daily   gabapentin  300 mg Oral QHS   gentamicin cream  1 Application Topical Daily   insulin aspart  0-5 Units Subcutaneous QHS   insulin aspart  0-9 Units Subcutaneous TID WC   isosorbide mononitrate  60 mg Oral Daily   metoprolol succinate  100 mg Oral Daily   pantoprazole  40 mg Oral Daily   psyllium  1 packet Oral BID   rosuvastatin  40 mg Oral Daily   vancomycin variable dose per unstable renal function (pharmacist dosing)   Does not apply See admin instructions   morphine injection, nitroGLYCERIN, ondansetron **OR** ondansetron (ZOFRAN) IV  Assessment/ Plan:  Casey French is a 72 y.o.  male with past medical conditions including GERD, diabetes, CAD, hypertension, gout, hyperlipidemia,  atrial  fibrillation, and end-stage renal disease on peritoneal dialysis.  Patient presents to the emergency department with complaints of nausea, vomiting, diarrhea, and abdominal pain.  Patient has been admitted for Diverticulitis [K57.92] Acute diverticulitis [K57.92]  [facility] CCPD Fills: 2.5L/ 4 exchanges/ 1.5hr dwell. No last fill  End stage renal disease on peritoneal dialysis.   Patient received treatment overnight, UF 641ml.  Patient continues to have adequate urine output as well, 950 mL recorded in previous 24 hours.  Will continue nightly treatments with 1.5% dialysate.   2. Anemia of chronic kidney disease Lab Results  Component Value Date   HGB 9.4 (L) 04/21/2022  Patient receives Mircera monthly at outpatient clinic. Hemoglobin remains within desired range.  3. Secondary Hyperparathyroidism: with outpatient labs: PTH 854, phosphorus 5.3, calcium 7.8 on 03/26/22.   Lab Results  Component Value Date   CALCIUM 7.9 (L) 04/21/2022   PHOS 4.5 03/08/2021    Patient receives calcitriol, cholecalciferol, and calcium carbonate outpatient.  Calcitriol and calcium carbonate resumed.  4. Hypertension with chronic kidney disease. Home regimen includes losartan, amlodipine, isosorbide, and metoprolol. Amlodipine and losartan held  Blood pressure acceptable at 136/84.  5. Diverticulitis, uncomplicated. Mild diverticulitis seen on CT. Cell count borderline, due to risk of infection, will order prophylactic antibiotics for peritonitis.   Patient received antibiotic daytime dwell of vancomycin 1 g and cefepime 1 g yesterday.  Will continue these nightly antibiotics during PD treatments.   LOS: 3   3/27/202411:30 AM

## 2022-04-22 ENCOUNTER — Inpatient Hospital Stay: Payer: Medicare PPO

## 2022-04-22 DIAGNOSIS — B957 Other staphylococcus as the cause of diseases classified elsewhere: Secondary | ICD-10-CM | POA: Diagnosis not present

## 2022-04-22 DIAGNOSIS — K5792 Diverticulitis of intestine, part unspecified, without perforation or abscess without bleeding: Secondary | ICD-10-CM | POA: Diagnosis not present

## 2022-04-22 DIAGNOSIS — R7881 Bacteremia: Secondary | ICD-10-CM

## 2022-04-22 LAB — CULTURE, BLOOD (ROUTINE X 2): Special Requests: ADEQUATE

## 2022-04-22 LAB — BASIC METABOLIC PANEL
Anion gap: 9 (ref 5–15)
BUN: 21 mg/dL (ref 8–23)
CO2: 24 mmol/L (ref 22–32)
Calcium: 7.8 mg/dL — ABNORMAL LOW (ref 8.9–10.3)
Chloride: 107 mmol/L (ref 98–111)
Creatinine, Ser: 4.32 mg/dL — ABNORMAL HIGH (ref 0.61–1.24)
GFR, Estimated: 14 mL/min — ABNORMAL LOW (ref >60)
Glucose, Bld: 89 mg/dL (ref 70–99)
Potassium: 3.1 mmol/L — ABNORMAL LOW (ref 3.5–5.1)
Sodium: 140 mmol/L (ref 135–145)

## 2022-04-22 LAB — CBC WITH DIFFERENTIAL/PLATELET
Abs Immature Granulocytes: 0.07 10*3/uL (ref 0.00–0.07)
Basophils Absolute: 0 10*3/uL (ref 0.0–0.1)
Basophils Relative: 1 %
Eosinophils Absolute: 0.2 10*3/uL (ref 0.0–0.5)
Eosinophils Relative: 3 %
HCT: 27.4 % — ABNORMAL LOW (ref 39.0–52.0)
Hemoglobin: 9.1 g/dL — ABNORMAL LOW (ref 13.0–17.0)
Immature Granulocytes: 1 %
Lymphocytes Relative: 12 %
Lymphs Abs: 0.9 10*3/uL (ref 0.7–4.0)
MCH: 31.9 pg (ref 26.0–34.0)
MCHC: 33.2 g/dL (ref 30.0–36.0)
MCV: 96.1 fL (ref 80.0–100.0)
Monocytes Absolute: 0.7 10*3/uL (ref 0.1–1.0)
Monocytes Relative: 10 %
Neutro Abs: 5.4 10*3/uL (ref 1.7–7.7)
Neutrophils Relative %: 73 %
Platelets: 222 10*3/uL (ref 150–400)
RBC: 2.85 MIL/uL — ABNORMAL LOW (ref 4.22–5.81)
RDW: 12.7 % (ref 11.5–15.5)
WBC: 7.3 10*3/uL (ref 4.0–10.5)
nRBC: 0 % (ref 0.0–0.2)

## 2022-04-22 LAB — GLUCOSE, CAPILLARY
Glucose-Capillary: 83 mg/dL (ref 70–99)
Glucose-Capillary: 156 mg/dL — ABNORMAL HIGH (ref 70–99)
Glucose-Capillary: 69 mg/dL — ABNORMAL LOW (ref 70–99)

## 2022-04-22 MED ORDER — POTASSIUM CHLORIDE CRYS ER 20 MEQ PO TBCR
40.0000 meq | EXTENDED_RELEASE_TABLET | Freq: Two times a day (BID) | ORAL | Status: AC
  Administered 2022-04-22 (×2): 40 meq via ORAL
  Filled 2022-04-22 (×2): qty 2

## 2022-04-22 MED ORDER — DELFLEX-LC/1.5% DEXTROSE 344 MOSM/L IP SOLN: INTRAPERITONEAL | Status: DC

## 2022-04-22 NOTE — Progress Notes (Signed)
Peritoneal Dialysis  Post Treatment Note:  PD treatment completed. Patient tolerated treatment well.   PD effluent is clear. PD exit site clean, dry and intact.   No specimen collected.  Patient is awake and alert and no acute distress.  Hand-off given to patient's nurse.   Total UF removed: 347 ml Post treatment weight: 60.5 kg   Casey Constable RN Kidney Dialysis Unit

## 2022-04-22 NOTE — Progress Notes (Signed)
Peritoneal Dialysis Treatment Initiation Note  Pre Treatment Weight: 61.6 kg  Consent signed and in chart.  PD treatment initiated via aseptic technique.   Patient is awake and alert. No complaints of pain.   PD exit site clean, dry and intact.  Gentamicin and new dressing applied.   Hand-off given to the patient's nurse.  Education provided to dept staff  regarding PD machine and how  to contact tech support if machine  alarms.    [place] Constable RN Kidney Dialysis Unit

## 2022-04-22 NOTE — Progress Notes (Signed)
Progress Note   Patient: Casey French [id number] DOB: June 28, 1950 DOA: 04/18/2022     4 DOS: the patient was seen and examined on 04/22/2022    Subjective:  Patient seen and examined at bedside this morning Patient has no more abdominal pain He complained of some back pain In the setting of bacteremia infectious disease has recommended MRI of the thoracic lumbar spine Denies nausea vomiting chest pain or cough     Brief hospital course: Casey French is a 72 y.o. male with medical history significant of coronary artery disease, diabetes, GERD, history of GI bleed, essential hypertension, hyperlipidemia, gout, depression, persistent atrial fibrillation on chronic anticoagulation who was brought in from home with nausea vomiting diarrhea as well as abdominal pain. Patient came to the ER where his blood pressure was 200/110.   Imaging showed acute uncomplicated sigmoid diverticulitis.       Assessment and Plan:   #acute uncomplicated sigmoid diverticulitis: Patient with multiple comorbidities.   Continue current antibiotic therapy Continue pain management and nausea management.   Abdominal pain improved   # Gram-positive bacteremia Patient received IV vancomycin dosing being followed by pharmacist awaiting repeat cultures Cefepime has been changed to ceftriaxone Nephrologist plans to complete 3 weeks course of ceftriaxone to cover for possible peritonitis in the setting of peritoneal dialysis catheter being in place Infectious disease consulted we appreciate input  In the setting of bacteremia infectious disease has recommended MRI of the thoracic lumbar spine  # end-stage renal disease on peritoneal dialysis:  Consult ed nephrology for dialysis needs   #hypokalemia: Continue repletion and monitoring   # Syncopal episode likely secondary to above Treatment as above    # lactic acidosis: Most likely as a result of infection. Patient received a course of IV fluid  which is currently discontinued   # diabetes:  Continue sliding scale insulin.   # GERD: Continue PPIs.   # hypertensive urgency: Systolic of [id number] on presentation.   Blood pressure improved  Continue IV hydralazine or labetalol. Continue metoprolol, isosorbide mononitrate Monitor blood pressure closely   # hyperlipidemia: Continue statin   #coronary artery disease: No acute decompensation.    Advance Care Planning:   Code Status: Full Code    Consults: Nephrology consulted as well as clinical pharmacist   Family Communication: Wife     Physical Exam: Constitutional: Elderly male lying in bed, in no acute distress Eyes: PERRL, lids and conjunctivae normal ENMT: Mucous membranes are moist.  Neck: normal, supple, no masses, no thyromegaly Respiratory: clear to auscultation bilaterally,  Cardiovascular: Regular rate and rhythm,  Abdomen: Abdominal tenderness resolved no masses palpated. Peritoneal dialysis catheter in place Musculoskeletal: Good range of motion, no joint swelling or tenderness, Skin: no rashes, lesions, ulcers. No induration Neurologic: CN 2-12 grossly intact.      Data Reviewed: CBC and BMP  reviewed by me today showing improvement hypokalemia with potassium of 3.1     Family Communication: No family member at bedside   Disposition: Status is: Inpatient Team continues to meet inpatient criteria as he is currently requiring IV antibiotics infectious disease input pending culture results    Planned Discharge Destination: Prior living condition   Time spent: 35 minutes        Vitals:   04/21/22 2003 04/22/22 0500 04/22/22 0541 04/22/22 0730  BP: 122/82  124/74 (!) 144/83  Pulse: 72  72 78  Resp: 18  18 18   Temp: 97.8 F (36.6 C)  97.6 F (36.4 C)  98 F (36.7 C)  TempSrc: Oral  Oral Oral  SpO2: 96%  95% 96%  Weight:  65.8 kg  60.5 kg  Height:         Author: Verline Lema, MD 04/22/2022 2:53 PM  For on call review [email or web address].

## 2022-04-22 NOTE — Progress Notes (Signed)
[facility]  ROUNDING NOTE   Subjective:   Casey French is a 72 year old male with past medical conditions including GERD, diabetes, CAD, hypertension, gout, hyperlipidemia, atrial fibrillation, and end-stage renal disease on peritoneal dialysis.  Patient presents to the emergency department with complaints of nausea, vomiting, diarrhea, and abdominal pain.  Patient has been admitted for Diverticulitis [K57.92] Acute diverticulitis [K57.92]  Patient is known to our practice and is followed by [facility], Dr. Juleen China for peritoneal dialysis.    Patient resting in bed Alert and oriented Continues to have mild nausea, no vomiting  UOP 1.1L PD UF 362ml  Objective:  Vital signs in last 24 hours:  Temp:  [97.6 F (36.4 C)-98.1 F (36.7 C)] 98 F (36.7 C) (03/28 0730) Pulse Rate:  [68-78] 78 (03/28 0730) Resp:  [18] 18 (03/28 0730) BP: (122-144)/(74-83) 144/83 (03/28 0730) SpO2:  [95 %-97 %] 96 % (03/28 0730) Weight:  [60.5 kg-65.8 kg] 60.5 kg (03/28 0730)  Weight change: -3.3 kg Filed Weights   04/21/22 1732 04/22/22 0500 04/22/22 0730  Weight: 60.8 kg 65.8 kg 60.5 kg    Intake/Output: I/O last 3 completed shifts: In: 1380 [P.O.:1080; IV Piggyback:300] Out: 2287 [Urine:1650; Other:637]   Intake/Output this shift:  Total I/O In: 240 [P.O.:240] Out: 897 [Urine:550; Other:347]  Physical Exam: General: NAD  Head: Normocephalic, atraumatic. Dry oral mucosal membranes  Eyes: Anicteric  Lungs:  Clear to auscultation, normal effort  Heart: Regular rate and rhythm  Abdomen:  Soft, nontender. [place]  Extremities:  No peripheral edema.  Neurologic: Alert and oriented, moving all four extremities  Skin: No lesions  Access: [facility]    Basic Metabolic Panel: Recent Labs  Lab 04/18/22 1809 04/18/22 1825 04/19/22 0506 04/20/22 0600 04/21/22 0604 04/22/22 0636  NA 135  --  140 137 140 140  K 3.3*  --  3.9 2.9* 3.5 3.1*  CL 108  --  107 107 102 107   CO2 16*  --  21* 23 25 24   GLUCOSE 120*  --  102* 94 95 89  BUN 45*  --  44* 35* 25* 21  CREATININE 5.28*  --  5.58* 4.80* 4.39* 4.32*  CALCIUM 7.8*  --  8.5* 7.5* 7.9* 7.8*  MG  --  1.9  --   --   --   --      Liver Function Tests: Recent Labs  Lab 04/18/22 1809 04/19/22 0506  AST 26 18  ALT 14 12  ALKPHOS 124 103  BILITOT 1.1 0.4  PROT 6.8 5.9*  ALBUMIN 3.0* 2.6*    Recent Labs  Lab 04/18/22 1809  LIPASE 63*    No results for input(s): "AMMONIA" in the last 168 hours.  CBC: Recent Labs  Lab 04/18/22 1809 04/19/22 0506 04/20/22 0600 04/21/22 0604 04/22/22 0636  WBC 9.2 9.4 9.6 10.1 7.3  NEUTROABS  --   --  7.3 8.1* 5.4  HGB 12.3* 10.6* 9.1* 9.4* 9.1*  HCT 37.0* 31.9* 27.7* 28.3* 27.4*  MCV 95.4 96.1 96.5 95.9 96.1  PLT 288 240 211 213 222     Cardiac Enzymes: No results for input(s): "CKTOTAL", "CKMB", "CKMBINDEX", "TROPONINI" in the last 168 hours.  BNP: Invalid input(s): "POCBNP"  CBG: Recent Labs  Lab 04/21/22 0752 04/21/22 1232 04/21/22 1640 04/21/22 2129 04/22/22 0731  GLUCAP 88 102* 105* 94 9     Microbiology: Results for orders placed or performed during the hospital encounter of 04/18/22  Resp panel by RT-PCR (RSV, Flu A&B,  Covid) Anterior Nasal Swab     Status: None   Collection Time: 04/18/22  6:14 PM   Specimen: Anterior Nasal Swab  Result Value Ref Range Status   SARS Coronavirus 2 by RT PCR NEGATIVE NEGATIVE Final    Comment: (NOTE) SARS-CoV-2 target nucleic acids are NOT DETECTED.  The SARS-CoV-2 RNA is generally detectable in upper respiratory specimens during the acute phase of infection. The lowest concentration of SARS-CoV-2 viral copies this assay can detect is 138 copies/mL. A negative result does not preclude SARS-Cov-2 infection and should not be used as the sole basis for treatment or other patient management decisions. A negative result may occur with  improper specimen collection/handling, submission of  specimen other than nasopharyngeal swab, presence of viral mutation(s) within the areas targeted by this assay, and inadequate number of viral copies(<138 copies/mL). A negative result must be combined with clinical observations, patient history, and epidemiological information. The expected result is Negative.  Fact Sheet for Patients:  EntrepreneurPulse.com.au  Fact Sheet for Healthcare Providers:  IncredibleEmployment.be  This test is no t yet approved or cleared by the [place] FDA and  has been authorized for detection and/or diagnosis of SARS-CoV-2 by FDA under an Emergency Use Authorization (EUA). This EUA will remain  in effect (meaning this test can be used) for the duration of the COVID-19 declaration under Section 564(b)(1) of the Act, 21 U.S.C.section 360bbb-3(b)(1), unless the authorization is terminated  or revoked sooner.       Influenza A by PCR NEGATIVE NEGATIVE Final   Influenza B by PCR NEGATIVE NEGATIVE Final    Comment: (NOTE) The Xpert Xpress SARS-CoV-2/FLU/RSV plus assay is intended as an aid in the diagnosis of influenza from Nasopharyngeal swab specimens and should not be used as a sole basis for treatment. Nasal washings and aspirates are unacceptable for Xpert Xpress SARS-CoV-2/FLU/RSV testing.  Fact Sheet for Patients: EntrepreneurPulse.com.au  Fact Sheet for Healthcare Providers: IncredibleEmployment.be  This test is not yet approved or cleared by the [place] FDA and has been authorized for detection and/or diagnosis of SARS-CoV-2 by FDA under an Emergency Use Authorization (EUA). This EUA will remain in effect (meaning this test can be used) for the duration of the COVID-19 declaration under Section 564(b)(1) of the Act, 21 U.S.C. section 360bbb-3(b)(1), unless the authorization is terminated or revoked.     Resp Syncytial Virus by PCR NEGATIVE NEGATIVE Final     Comment: (NOTE) Fact Sheet for Patients: EntrepreneurPulse.com.au  Fact Sheet for Healthcare Providers: IncredibleEmployment.be  This test is not yet approved or cleared by the [place] FDA and has been authorized for detection and/or diagnosis of SARS-CoV-2 by FDA under an Emergency Use Authorization (EUA). This EUA will remain in effect (meaning this test can be used) for the duration of the COVID-19 declaration under Section 564(b)(1) of the Act, 21 U.S.C. section 360bbb-3(b)(1), unless the authorization is terminated or revoked.  Performed at [facility], [address] [place], [place] [address]   Blood Culture (routine x 2)     Status: Abnormal   Collection Time: 04/18/22  6:40 PM   Specimen: BLOOD  Result Value Ref Range Status   Specimen Description   Final    BLOOD BLOOD RIGHT HAND Performed at [facility], [address] [place], [place] [address]    Special Requests   Final    BOTTLES DRAWN AEROBIC AND ANAEROBIC Blood Culture adequate volume Performed at [facility], [address], [place], [place] [address]  Culture  Setup Time   Final    GRAM POSITIVE COCCI IN BOTH AEROBIC AND ANAEROBIC BOTTLES CRITICAL RESULT CALLED TO, READ BACK BY AND VERIFIED WITH: C/DEVON MITCHELL 04/19/22 0924 SLM    Culture (A)  Final    STAPHYLOCOCCUS HOMINIS Two isolates with different morphologies were identified as the same organism.The most resistant organism was reported. Performed at [facility], [address] [place], [place] [address]    Report Status 04/22/2022 FINAL  Final   Organism ID, Bacteria STAPHYLOCOCCUS HOMINIS  Final      Susceptibility   Staphylococcus hominis - MIC*    CIPROFLOXACIN 2 INTERMEDIATE Intermediate     ERYTHROMYCIN >=8 RESISTANT Resistant     GENTAMICIN <=0.5 SENSITIVE Sensitive     OXACILLIN <=0.25 SENSITIVE Sensitive     TETRACYCLINE >=16 RESISTANT Resistant      VANCOMYCIN <=0.5 SENSITIVE Sensitive     TRIMETH/SULFA <=10 SENSITIVE Sensitive     CLINDAMYCIN <=0.25 SENSITIVE Sensitive     RIFAMPIN <=0.5 SENSITIVE Sensitive     Inducible Clindamycin NEGATIVE Sensitive     * STAPHYLOCOCCUS HOMINIS  Blood Culture (routine x 2)     Status: Abnormal   Collection Time: 04/18/22  6:40 PM   Specimen: BLOOD  Result Value Ref Range Status   Specimen Description   Final    BLOOD BLOOD LEFT ARM Performed at [facility], [address] [place], [place] [address]    Special Requests   Final    BOTTLES DRAWN AEROBIC AND ANAEROBIC Blood Culture adequate volume Performed at [facility], [address] [place], [place] [address]    Culture  Setup Time   Final    GRAM POSITIVE COCCI AEROBIC BOTTLE ONLY CRITICAL VALUE NOTED.  VALUE IS CONSISTENT WITH PREVIOUSLY REPORTED AND CALLED VALUE. Performed at [facility], [address] [place], [place] [address]    Culture (A)  Final    STAPHYLOCOCCUS HOMINIS SUSCEPTIBILITIES PERFORMED ON PREVIOUS CULTURE WITHIN THE LAST 5 DAYS. Performed at [facility], [address] [place], [place] [address]    Report Status 04/22/2022 FINAL  Final  Blood Culture ID Panel (Reflexed)     Status: Abnormal   Collection Time: 04/18/22  6:40 PM  Result Value Ref Range Status   Enterococcus faecalis NOT DETECTED NOT DETECTED Final   Enterococcus Faecium NOT DETECTED NOT DETECTED Final   Listeria monocytogenes NOT DETECTED NOT DETECTED Final   Staphylococcus species DETECTED (A) NOT DETECTED Final    Comment: CRITICAL RESULT CALLED TO, READ BACK BY AND VERIFIED WITH: C/DEVON MITCHELL 04/19/22 0924 SLM    Staphylococcus aureus (BCID) NOT DETECTED NOT DETECTED Final   Staphylococcus epidermidis NOT DETECTED NOT DETECTED Final   Staphylococcus lugdunensis NOT DETECTED NOT DETECTED Final   Streptococcus species NOT DETECTED NOT DETECTED Final   Streptococcus agalactiae NOT DETECTED NOT  DETECTED Final   Streptococcus pneumoniae NOT DETECTED NOT DETECTED Final   Streptococcus pyogenes NOT DETECTED NOT DETECTED Final   A.calcoaceticus-baumannii NOT DETECTED NOT DETECTED Final   Bacteroides fragilis NOT DETECTED NOT DETECTED Final   Enterobacterales NOT DETECTED NOT DETECTED Final   Enterobacter cloacae complex NOT DETECTED NOT DETECTED Final   Escherichia coli NOT DETECTED NOT DETECTED Final   Klebsiella aerogenes NOT DETECTED NOT DETECTED Final   Klebsiella oxytoca NOT DETECTED NOT DETECTED Final   Klebsiella pneumoniae NOT DETECTED NOT DETECTED Final   Proteus species NOT DETECTED NOT DETECTED Final   Salmonella species NOT  DETECTED NOT DETECTED Final   Serratia marcescens NOT DETECTED NOT DETECTED Final   Haemophilus influenzae NOT DETECTED NOT DETECTED Final   Neisseria meningitidis NOT DETECTED NOT DETECTED Final   Pseudomonas aeruginosa NOT DETECTED NOT DETECTED Final   Stenotrophomonas maltophilia NOT DETECTED NOT DETECTED Final   Candida albicans NOT DETECTED NOT DETECTED Final   Candida auris NOT DETECTED NOT DETECTED Final   Candida glabrata NOT DETECTED NOT DETECTED Final   Candida krusei NOT DETECTED NOT DETECTED Final   Candida parapsilosis NOT DETECTED NOT DETECTED Final   Candida tropicalis NOT DETECTED NOT DETECTED Final   Cryptococcus neoformans/gattii NOT DETECTED NOT DETECTED Final    Comment: Performed at [facility], [address] [place], [place] [address]  Culture, blood (Routine X 2) w Reflex to ID Panel     Status: None (Preliminary result)   Collection Time: 04/19/22  3:56 PM   Specimen: BLOOD  Result Value Ref Range Status   Specimen Description BLOOD BLOOD RIGHT ARM  Final   Special Requests   Final    BOTTLES DRAWN AEROBIC AND ANAEROBIC Blood Culture results may not be optimal due to an excessive volume of blood received in culture bottles   Culture   Final    NO GROWTH 3 DAYS Performed at [facility], [address] [place], [place] [address]    Report Status PENDING  Incomplete  Culture, blood (Routine X 2) w Reflex to ID Panel     Status: None (Preliminary result)   Collection Time: 04/19/22  3:58 PM   Specimen: BLOOD  Result Value Ref Range Status   Specimen Description BLOOD BLOOD RIGHT WRIST  Final   Special Requests   Final    BOTTLES DRAWN AEROBIC AND ANAEROBIC Blood Culture results may not be optimal due to an inadequate volume of blood received in culture bottles   Culture   Final    NO GROWTH 3 DAYS Performed at [facility], [address] [place], [place] [address]    Report Status PENDING  Incomplete  Body fluid culture w Gram Stain     Status: None (Preliminary result)   Collection Time: 04/19/22  6:54 PM   Specimen: Peritoneal Washings; Body Fluid  Result Value Ref Range Status   Specimen Description   Final    PERITONEAL Performed at [facility], [address] [place], [place] [address]    Special Requests   Final    PERITONEAL Performed at [facility], [address] [place], [place] [address]    Gram Stain   Final    RARE WBC PRESENT, PREDOMINANTLY MONONUCLEAR NO ORGANISMS SEEN CYTOSPIN SMEAR    Culture   Final    NO GROWTH 2 DAYS Performed at [facility], [place] [address] [place],  [address]    Report Status PENDING  Incomplete    Coagulation Studies: No results for input(s): "LABPROT", "INR" in the last 72 hours.  Urinalysis: No results for input(s): "COLORURINE", "LABSPEC", "PHURINE", "GLUCOSEU", "HGBUR", "BILIRUBINUR", "KETONESUR", "PROTEINUR", "UROBILINOGEN", "NITRITE", "LEUKOCYTESUR" in the last 72 hours.  Invalid input(s): "APPERANCEUR"     Imaging: No results found.   Medications:    ceFEPIme (MAXIPIME) 1,200 mg in dialysis solution 1.5% low-MG/low-CA 3,000 mL dialysis solution     metronidazole 500 mg (04/21/22 2229)    apixaban  2.5 mg Oral BID   calcitRIOL  0.25 mcg Oral Daily   calcium  carbonate  2 tablet Oral Daily  gabapentin  300 mg Oral QHS   gentamicin cream  1 Application Topical Daily   insulin aspart  0-5 Units Subcutaneous QHS   insulin aspart  0-9 Units Subcutaneous TID WC   isosorbide mononitrate  60 mg Oral Daily   metoprolol succinate  100 mg Oral Daily   pantoprazole  40 mg Oral Daily   psyllium  1 packet Oral BID   rosuvastatin  40 mg Oral Daily   vancomycin variable dose per unstable renal function (pharmacist dosing)   Does not apply See admin instructions   morphine injection, nitroGLYCERIN, ondansetron **OR** ondansetron (ZOFRAN) IV  Assessment/ Plan:  [name] is a 72 y.o.  male with past medical conditions including GERD, diabetes, CAD, hypertension, gout, hyperlipidemia, atrial fibrillation, and end-stage renal disease on peritoneal dialysis.  Patient presents to the emergency department with complaints of nausea, vomiting, diarrhea, and abdominal pain.  Patient has been admitted for Diverticulitis [K57.92] Acute diverticulitis [K57.92]  [facility] C[facility] Fills: 2.5L/ 4 exchanges/ 1.5hr dwell. No last fill  End stage renal disease on peritoneal dialysis.   Patient received treatment overnight, UF 313ml.  Patient continues to have adequate urine output, 1.1L Will continue nightly treatments with 1.5% dialysate.   2. Anemia of chronic kidney disease Lab Results  Component Value Date   HGB 9.1 (L) 04/22/2022  Patient receives Mircera monthly at outpatient clinic. Hemoglobin stable  3. Secondary Hyperparathyroidism: with outpatient labs: PTH 854, phosphorus 5.3, calcium 7.8 on 03/26/22.   Lab Results  Component Value Date   CALCIUM 7.8 (L) 04/22/2022   PHOS 4.5 03/08/2021    Patient receives calcitriol, cholecalciferol, and calcium carbonate outpatient.  Calcitriol and calcium carbonate resumed.  4. Hypertension with chronic kidney disease. Home regimen includes losartan, amlodipine, isosorbide, and metoprolol. Amlodipine and losartan  held  Blood pressure stable for this patient  5. Diverticulitis, uncomplicated. Mild diverticulitis seen on CT. Cell count borderline, due to risk of infection, will order prophylactic antibiotics for peritonitis.   Patient received antibiotic daytime dwell of vancomycin 1 g and cefepime 1 g on 04/20/22  Will continue these nightly cefepime 1g with PD treatments.   LOS: 4   3/28/202411:15 AM

## 2022-04-22 NOTE — Progress Notes (Signed)
Date of Admission:  04/18/2022   Principal Problem:   Acute diverticulitis Active Problems:   Hypertensive urgency   AKI (acute kidney injury) ([place])   Benign essential HTN   Hyperlipidemia   DM type 2 (diabetes mellitus, type 2) (HCC)   CAD (coronary artery disease)   GERD without esophagitis   Atrial fibrillation, chronic (HCC)   Chronic renal disease, stage IV (HCC)   Hypokalemia    Subjective: Pt c/o room spinning , nausea on moving head and especially when he stands up  Medications:   apixaban  2.5 mg Oral BID   calcitRIOL  0.25 mcg Oral Daily   calcium carbonate  2 tablet Oral Daily   gabapentin  300 mg Oral QHS   gentamicin cream  1 Application Topical Daily   insulin aspart  0-5 Units Subcutaneous QHS   insulin aspart  0-9 Units Subcutaneous TID WC   isosorbide mononitrate  60 mg Oral Daily   metoprolol succinate  100 mg Oral Daily   pantoprazole  40 mg Oral Daily   potassium chloride  40 mEq Oral BID   psyllium  1 packet Oral BID   rosuvastatin  40 mg Oral Daily   vancomycin variable dose per unstable renal function (pharmacist dosing)   Does not apply See admin instructions    Objective: Vital signs in last 24 hours: Patient Vitals for the past 24 hrs:  BP Temp Temp src Pulse Resp SpO2 Weight  04/22/22 1700 -- -- -- -- -- -- 61.6 kg  04/22/22 1531 123/76 98.1 F (36.7 C) -- 70 18 97 % --  04/22/22 0730 (!) 144/83 98 F (36.7 C) Oral 78 18 96 % 60.5 kg  04/22/22 0541 124/74 97.6 F (36.4 C) Oral 72 18 95 % --  04/22/22 0500 -- -- -- -- -- -- 65.8 kg  04/21/22 2003 122/82 97.8 F (36.6 C) Oral 72 18 96 % --  04/21/22 1732 -- -- -- -- -- -- 60.8 kg      PHYSICAL EXAM:  General: Alert, cooperative, no distress, appears stated age. In bed Head: Normocephalic, without obvious abnormality, atraumatic. Lungs: Clear to auscultation bilaterally. No Wheezing or Rhonchi. No rales. Heart: Regular rate and rhythm, no murmur, rub or gallop. Abdomen: Soft, PD  cath Extremities: atraumatic, no cyanosis. No edema. No clubbing Skin: No rashes or lesions. Or bruising Lymph: Cervical, supraclavicular normal. Neurologic: Grossly non-focal  Lab Results    Latest Ref Rng & Units 04/22/2022    6:36 AM 04/21/2022    6:04 AM 04/20/2022    6:00 AM  CBC  WBC 4.0 - 10.5 K/uL 7.3  10.1  9.6   Hemoglobin 13.0 - 17.0 g/dL 9.1  9.4  9.1   Hematocrit 39.0 - 52.0 % 27.4  28.3  27.7   Platelets 150 - 400 K/uL 222  213  211        Latest Ref Rng & Units 04/22/2022    6:36 AM 04/21/2022    6:04 AM 04/20/2022    6:00 AM  CMP  Glucose 70 - 99 mg/dL 89  95  94   BUN 8 - 23 mg/dL 21  25  35   Creatinine 0.61 - 1.24 mg/dL 4.32  4.39  4.80   Sodium 135 - 145 mmol/L 140  140  137   Potassium 3.5 - 5.1 mmol/L 3.1  3.5  2.9   Chloride 98 - 111 mmol/L 107  102  107   CO2 22 -  32 mmol/L 24  25  23    Calcium 8.9 - 10.3 mg/dL 7.8  7.9  7.5       Microbiology: 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 EXAM: MRI THORACIC AND LUMBAR SPINE WITHOUT CONTRAST TECHNIQUE: Multiplanar and multiecho pulse sequences of the thoracic and lumbar spine were obtained without intravenous contrast. COMPARISON:  MRI thoracic spine 02/22/2016. CT chest/abdomen/pelvis 04/18/2022. FINDINGS: MRI THORACIC SPINE FINDINGS Alignment:  Normal. Vertebrae: No fracture, evidence of discitis, or bone lesion. Cord:  Normal cord signal and volume. Paraspinal and other soft tissues: Trace bilateral pleural effusions. Disc levels: No significant degenerative change. MRI LUMBAR SPINE FINDINGS Segmentation:  Standard. Alignment:  Normal. Vertebrae:  No fracture, evidence of  discitis, or bone lesion. Conus medullaris and cauda equina: Conus extends to the T12-L1 level. Conus and cauda equina appear normal. Paraspinal and other soft tissues: Atrophic kidneys. Moderate fatty atrophy of the paraspinal muscles. Disc levels: No disc herniation or spinal canal stenosis. Mild bilateral facet arthropathy at L5-S1. IMPRESSION: 1. No evidence of discitis-osteomyelitis in the thoracic or lumbar spine. 2. No spinal canal or neural foraminal stenosis. 3. Trace bilateral pleural effusions. Electronically Signed   By: Emmit Alexanders M.D.   On: 04/22/2022 11:55   MR LUMBAR SPINE WO CONTRAST  Result Date: 04/22/2022 CLINICAL DATA:  Mid-back pain, infection suspected, no prior imaging; Low back pain, infection suspected, no prior imaging. EXAM: MRI THORACIC AND LUMBAR SPINE WITHOUT CONTRAST TECHNIQUE: Multiplanar and multiecho pulse sequences of the thoracic and lumbar spine were obtained without intravenous contrast. COMPARISON:  MRI thoracic spine 02/22/2016. CT chest/abdomen/pelvis 04/18/2022. FINDINGS: MRI THORACIC SPINE FINDINGS Alignment:  Normal. Vertebrae: No fracture, evidence of discitis, or bone lesion. Cord:  Normal cord signal and volume. Paraspinal and other soft tissues: Trace bilateral pleural effusions. Disc levels: No significant degenerative change. MRI LUMBAR SPINE FINDINGS Segmentation:  Standard. Alignment:  Normal. Vertebrae:  No fracture, evidence of discitis, or bone lesion. Conus medullaris and cauda equina: Conus extends to the T12-L1 level. Conus and cauda equina appear normal. Paraspinal and other soft tissues: Atrophic kidneys. Moderate fatty atrophy of the paraspinal muscles. Disc levels: No disc herniation or spinal canal stenosis. Mild bilateral facet arthropathy at L5-S1. IMPRESSION: 1. No evidence of discitis-osteomyelitis in the thoracic or lumbar spine. 2. No spinal canal or neural foraminal stenosis. 3. Trace bilateral pleural effusions. Electronically Signed    By: Emmit Alexanders M.D.   On: 04/22/2022 11:55     Assessment/Plan: Diverticulitis of sigmoid - uncomplicated Pt currently on cefepime thru peritoneal dialysis catheter?- if ceftriaxone is posisble to do by PD I would prefer that On IV flagyl Will need antibiotics for 10 days a sit is uncomplicated diverticulitis On discharge can even do just augmentin PO adjusted for PD   Staph hominis bacteremia both sets- very likely contaminant as both sets have 2 different strains- also patient says taken for the same site  peritoneal fluid cell count not indicative of SBP - culture NG. So the staph hominis being a pathogen from the PD source is unlikely Pt c/o thoracic mid back pain-  MRI thoracic and lumbar spine no evidence of discitis  or osteomyelitis Can DC vanco  C/o ron spinning and nasuea on standing up- vertigo-    ESRD on PD   Anemia   DM on sliding scale ? _HTN on admission was very high. Management as per primary team   H/o L THA  Discussed the management with the patient

## 2022-04-22 NOTE — Progress Notes (Signed)
Pharmacy Antibiotic Note  Casey French is a 72 y.o. male admitted on 04/18/2022 with possible bacteremia.  Patient presented with N/V/D and abdominal pain.  CT revealed mild diveriticulitis.  Pharmacy has been consulted for vancomycin dosing for possible bacteremia (staphylococcus species) as well as intra-peritoneal cefepime dosing.  ID consulted 04/21/22.  Day 6 antibiotics - cefepime IP plus metronidazole for uncomplicated diverticulitis. Renal: ESRD on PD WBC WNL Afebrile   Staph hominis in blood culture determined to be a contaminant as 2 different strains growing across both sets and drawn at same site per patient. Unlikely for S. Hominis to be pathogen from PD source, as peritoneal fluid count is not indicative of PD (PMN < 250) and peritoneal fluid culture no growth x 2 days. Therefore, vancomycin was discontinued on 04/22/22.  Plan:  Switching cefepime to ceftriaxone. Start ceftriaxone IP 1200 mg in 2.5L bag Follow PD schedule per nephrology  Height: 5\' 9"  (175.3 cm) Weight: 60.5 kg (133 lb 6.1 oz) IBW/kg (Calculated) : 70.7  Temp (24hrs), Avg:97.9 F (36.6 C), Min:97.6 F (36.4 C), Max:98.1 F (36.7 C)  Recent Labs  Lab 04/18/22 1809 04/18/22 1840 04/18/22 2030 04/19/22 0506 04/20/22 0600 04/21/22 0604 04/22/22 0636  WBC 9.2  --   --  9.4 9.6 10.1 7.3  CREATININE 5.28*  --   --  5.58* 4.80* 4.39* 4.32*  LATICACIDVEN  --  2.4* 1.5  --   --   --   --   VANCORANDOM  --   --   --   --   --  14  --      Estimated Creatinine Clearance: 13.4 mL/min (A) (by C-G formula based on SCr of 4.32 mg/dL (H)).    Allergies  Allergen Reactions   Morphine Anxiety    agitation     Antimicrobials this admission: 3/24 ceftriaxone >>3/26 3/24 metronidazole >> 3/25 vancomycin >> 3/28 3/26 cefepime IP >>  Dose adjustments this admission:   Microbiology results: 3/24 BCx: S hominis in both sets growing in 3/4 bottles (drawn from same site; 2 different strains) 3/25  Bcx: NG x 4 days 3/25 peritoneal fluid: NG x 2 days  Thank you for allowing pharmacy to be a part of this patient's care.  Glean Salvo, PharmD, BCPS Clinical Pharmacist  04/22/2022 10:52 AM

## 2022-04-23 DIAGNOSIS — K5792 Diverticulitis of intestine, part unspecified, without perforation or abscess without bleeding: Secondary | ICD-10-CM | POA: Diagnosis not present

## 2022-04-23 LAB — BASIC METABOLIC PANEL
Anion gap: 9 (ref 5–15)
BUN: 18 mg/dL (ref 8–23)
CO2: 24 mmol/L (ref 22–32)
Calcium: 8 mg/dL — ABNORMAL LOW (ref 8.9–10.3)
Chloride: 104 mmol/L (ref 98–111)
Creatinine, Ser: 4.29 mg/dL — ABNORMAL HIGH (ref 0.61–1.24)
GFR, Estimated: 14 mL/min — ABNORMAL LOW (ref >60)
Glucose, Bld: 97 mg/dL (ref 70–99)
Potassium: 3.2 mmol/L — ABNORMAL LOW (ref 3.5–5.1)
Sodium: 137 mmol/L (ref 135–145)

## 2022-04-23 LAB — BODY FLUID CULTURE W GRAM STAIN: Culture: NO GROWTH

## 2022-04-23 LAB — CBC WITH DIFFERENTIAL/PLATELET
Abs Immature Granulocytes: 0.09 10*3/uL — ABNORMAL HIGH (ref 0.00–0.07)
Basophils Absolute: 0 10*3/uL (ref 0.0–0.1)
Basophils Relative: 1 %
Eosinophils Absolute: 0.2 10*3/uL (ref 0.0–0.5)
Eosinophils Relative: 2 %
HCT: 29.2 % — ABNORMAL LOW (ref 39.0–52.0)
Hemoglobin: 9.6 g/dL — ABNORMAL LOW (ref 13.0–17.0)
Immature Granulocytes: 1 %
Lymphocytes Relative: 11 %
Lymphs Abs: 0.8 10*3/uL (ref 0.7–4.0)
MCH: 31.4 pg (ref 26.0–34.0)
MCHC: 32.9 g/dL (ref 30.0–36.0)
MCV: 95.4 fL (ref 80.0–100.0)
Monocytes Absolute: 0.8 10*3/uL (ref 0.1–1.0)
Monocytes Relative: 10 %
Neutro Abs: 6 10*3/uL (ref 1.7–7.7)
Neutrophils Relative %: 75 %
Platelets: 254 10*3/uL (ref 150–400)
RBC: 3.06 MIL/uL — ABNORMAL LOW (ref 4.22–5.81)
RDW: 12.7 % (ref 11.5–15.5)
WBC: 7.8 10*3/uL (ref 4.0–10.5)
nRBC: 0 % (ref 0.0–0.2)

## 2022-04-23 LAB — CULTURE, BLOOD (ROUTINE X 2): Special Requests: ADEQUATE

## 2022-04-23 LAB — GLUCOSE, CAPILLARY
Glucose-Capillary: 115 mg/dL — ABNORMAL HIGH (ref 70–99)
Glucose-Capillary: 116 mg/dL — ABNORMAL HIGH (ref 70–99)
Glucose-Capillary: 141 mg/dL — ABNORMAL HIGH (ref 70–99)
Glucose-Capillary: 84 mg/dL (ref 70–99)
Glucose-Capillary: 85 mg/dL (ref 70–99)

## 2022-04-23 LAB — VANCOMYCIN, RANDOM: Vancomycin Rm: 20 ug/mL

## 2022-04-23 LAB — CORTISOL: Cortisol, Plasma: 9.6 ug/dL

## 2022-04-23 MED ORDER — FOLIC ACID 1 MG PO TABS
1.0000 mg | ORAL_TABLET | Freq: Every day | ORAL | Status: DC
  Administered 2022-04-23 – 2022-04-24 (×2): 1 mg via ORAL
  Filled 2022-04-23 (×2): qty 1

## 2022-04-23 MED ORDER — POTASSIUM CHLORIDE CRYS ER 20 MEQ PO TBCR: 40.0000 meq | EXTENDED_RELEASE_TABLET | Freq: Once | ORAL | Status: DC

## 2022-04-23 MED ORDER — MECLIZINE HCL 25 MG PO TABS
25.0000 mg | ORAL_TABLET | Freq: Two times a day (BID) | ORAL | Status: DC
  Administered 2022-04-23 – 2022-04-24 (×3): 25 mg via ORAL
  Filled 2022-04-23 (×3): qty 1

## 2022-04-23 MED ORDER — SODIUM CHLORIDE 0.9 % IV SOLN: INTRAVENOUS | Status: DC | PRN

## 2022-04-23 MED ORDER — DELFLEX-LC/1.5% DEXTROSE 344 MOSM/L IP SOLN
Freq: Every day | INTRAPERITONEAL | Status: DC
  Filled 2022-04-23 (×2): qty 3000

## 2022-04-23 MED ORDER — ASPIRIN 81 MG PO TBEC
81.0000 mg | DELAYED_RELEASE_TABLET | Freq: Every day | ORAL | Status: DC
  Administered 2022-04-23 – 2022-04-24 (×2): 81 mg via ORAL
  Filled 2022-04-23 (×2): qty 1

## 2022-04-23 MED ORDER — DELFLEX-LC/1.5% DEXTROSE 344 MOSM/L IP SOLN
Freq: Every day | INTRAPERITONEAL | Status: DC
  Filled 2022-04-23: qty 3000

## 2022-04-23 MED ORDER — POTASSIUM CHLORIDE 20 MEQ PO PACK
40.0000 meq | PACK | Freq: Once | ORAL | Status: AC
  Administered 2022-04-23: 40 meq via ORAL
  Filled 2022-04-23: qty 2

## 2022-04-23 NOTE — Progress Notes (Addendum)
Pt unable to swallow K+ pills and requests granules if K+ ordered again.  Notified Sharion Settler, NP of new lab K+ 3.2. No new orders, she will defer to day team.

## 2022-04-23 NOTE — Progress Notes (Signed)
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 bacteremia    Subjective: Pt c/o room spinning , nausea on moving head and especially when he stands up  Medications:   apixaban  2.5 mg Oral BID   aspirin EC  81 mg Oral Daily   calcitRIOL  0.25 mcg Oral Daily   calcium carbonate  2 tablet Oral Daily   folic acid  1 mg Oral Daily   gentamicin cream  1 Application Topical Daily   insulin aspart  0-5 Units Subcutaneous QHS   insulin aspart  0-9 Units Subcutaneous TID WC   isosorbide mononitrate  60 mg Oral Daily   meclizine  25 mg Oral BID   metoprolol succinate  100 mg Oral Daily   pantoprazole  40 mg Oral Daily   psyllium  1 packet Oral BID   rosuvastatin  40 mg Oral Daily    Objective: Vital signs in last 24 hours: Patient Vitals for the past 24 hrs:  BP Temp Temp src Pulse Resp SpO2  04/23/22 1923 104/61 97.9 F (36.6 C) Oral 89 20 96 %  04/23/22 1520 126/89 98 F (36.7 C) -- 63 18 --  04/23/22 0731 136/74 97.7 F (36.5 C) -- 70 18 95 %  04/23/22 0420 120/77 98.2 F (36.8 C) -- 69 20 97 %      PHYSICAL EXAM:  General: Alert, cooperative, no distress, appears stated age. In bed Head: Normocephalic, without obvious abnormality, atraumatic. Lungs: Clear to auscultation bilaterally. No Wheezing or Rhonchi. No rales. Heart: Regular rate and rhythm, no murmur, rub or gallop. Abdomen: Soft, PD cath Extremities: atraumatic, no cyanosis. No edema. No clubbing Skin: No rashes or lesions. Or bruising Lymph: Cervical, supraclavicular normal. Neurologic: Grossly non-focal  Lab Results    Latest Ref Rng &  Units 04/23/2022    5:18 AM 04/22/2022    6:36 AM 04/21/2022    6:04 AM  CBC  WBC 4.0 - 10.5 K/uL 7.8  7.3  10.1   Hemoglobin 13.0 - 17.0 g/dL 9.6  9.1  9.4   Hematocrit 39.0 - 52.0 % 29.2  27.4  28.3   Platelets 150 - 400 K/uL 254  222  213        Latest Ref Rng & Units 04/23/2022    5:18 AM 04/22/2022    6:36 AM 04/21/2022    6:04 AM  CMP  Glucose 70 - 99 mg/dL 97  89  95   BUN 8 - 23 mg/dL 18  21  25    Creatinine 0.61 - 1.24 mg/dL 4.29  4.32  4.39   Sodium 135 - 145 mmol/L 137  140  140   Potassium 3.5 - 5.1 mmol/L 3.2  3.1  3.5   Chloride 98 - 111 mmol/L 104  107  102   CO2 22 - 32 mmol/L 24  24  25    Calcium 8.9 - 10.3 mg/dL 8.0  7.8  7.9       Microbiology: Staph hominis bacteremia in 2 sets- but looks like they are 2 different strains Also patient says all the 4  bottle of blood culture taken from the same site which was left arm IV line    Studies/Results: MR THORACIC SPINE WO CONTRAST  Result Date: 04/22/2022 CLINICAL DATA:  Mid-back pain, infection suspected, no prior imaging; Low back pain, infection suspected, no prior imaging. EXAM: MRI THORACIC AND LUMBAR SPINE WITHOUT CONTRAST TECHNIQUE: Multiplanar and multiecho pulse sequences of the thoracic and lumbar spine were obtained without intravenous contrast. COMPARISON:  MRI thoracic spine 02/22/2016. CT chest/abdomen/pelvis 04/18/2022. FINDINGS: MRI THORACIC SPINE FINDINGS Alignment:  Normal. Vertebrae: No fracture, evidence of discitis, or bone lesion. Cord:  Normal cord signal and volume. Paraspinal and other soft tissues: Trace bilateral pleural effusions. Disc levels: No significant degenerative change. MRI LUMBAR SPINE FINDINGS Segmentation:  Standard. Alignment:  Normal. Vertebrae:  No fracture, evidence of discitis, or bone lesion. Conus medullaris and cauda equina: Conus extends to the T12-L1 level. Conus and cauda equina appear normal. Paraspinal and other soft tissues: Atrophic kidneys. Moderate fatty atrophy of the  paraspinal muscles. Disc levels: No disc herniation or spinal canal stenosis. Mild bilateral facet arthropathy at L5-S1. IMPRESSION: 1. No evidence of discitis-osteomyelitis in the thoracic or lumbar spine. 2. No spinal canal or neural foraminal stenosis. 3. Trace bilateral pleural effusions. Electronically Signed   By: Emmit Alexanders M.D.   On: 04/22/2022 11:55   MR LUMBAR SPINE WO CONTRAST  Result Date: 04/22/2022 CLINICAL DATA:  Mid-back pain, infection suspected, no prior imaging; Low back pain, infection suspected, no prior imaging. EXAM: MRI THORACIC AND LUMBAR SPINE WITHOUT CONTRAST TECHNIQUE: Multiplanar and multiecho pulse sequences of the thoracic and lumbar spine were obtained without intravenous contrast. COMPARISON:  MRI thoracic spine 02/22/2016. CT chest/abdomen/pelvis 04/18/2022. FINDINGS: MRI THORACIC SPINE FINDINGS Alignment:  Normal. Vertebrae: No fracture, evidence of discitis, or bone lesion. Cord:  Normal cord signal and volume. Paraspinal and other soft tissues: Trace bilateral pleural effusions. Disc levels: No significant degenerative change. MRI LUMBAR SPINE FINDINGS Segmentation:  Standard. Alignment:  Normal. Vertebrae:  No fracture, evidence of discitis, or bone lesion. Conus medullaris and cauda equina: Conus extends to the T12-L1 level. Conus and cauda equina appear normal. Paraspinal and other soft tissues: Atrophic kidneys. Moderate fatty atrophy of the paraspinal muscles. Disc levels: No disc herniation or spinal canal stenosis. Mild bilateral facet arthropathy at L5-S1. IMPRESSION: 1. No evidence of discitis-osteomyelitis in the thoracic or lumbar spine. 2. No spinal canal or neural foraminal stenosis. 3. Trace bilateral pleural effusions. Electronically Signed   By: Emmit Alexanders M.D.   On: 04/22/2022 11:55     Assessment/Plan: Diverticulitis of sigmoid - uncomplicated Cefepime changed to ceftriaxone  thru peritoneal dialysis catheter? On IV flagyl antibiotics for 10  days total as this is uncomplicated diverticulitis On discharge can even do just augmentin PO adjusted for PD   Staph hominis bacteremia both sets- y contaminant as both sets have 2 different strains/ with different susceptibility- also patient says they were  taken for the same site  peritoneal fluid cell count not indicative of SBP - culture NG. So the staph hominis being a pathogen from the PD source is unlikely Pt c/o thoracic mid back pain-  MRI thoracic and lumbar spine no evidence of discitis or osteomyelitis   C/o roon spinning and nasuea on standing up- vertigo-  Which is better today Management as per primary team   ESRD on PD   Anemia   DM on sliding scale ? _HTN on admission was very high. Management as per primary team   H/o L  THA  Discussed the management with the patient and nephrologist  ID will sign off- call if needed

## 2022-04-23 NOTE — Evaluation (Signed)
Physical Therapy Evaluation Patient Details Name: Casey French MRN: [id number] DOB: 04/16/1950 Today's Date: 04/23/2022  History of Present Illness  Pt is a 72 y.o. male presenting to hospital 04/18/22 with c/o 5 days of generalized weakness, fatigue, nausea, abdominal pain, chest pain, HA, and SOB; also some near syncope.  On peritoneal dialysis.  Pt admitted with acute uncomplicated sigmoid diverticulitis, gram-positive bacteremia, hypokalemia, and sycopal episode.  PMH includes CAD, DM, h/o GI bleed, htn, HLD, gout, depression, persistent a-fib on chronic anticoagulation, CKD, STEMI, L THA (anterior approach).  Clinical Impression  Prior to hospital admission, pt was independent with functional mobility; lives with his wife (who works) in 1 level home with 4 STE B railings.  No c/o pain during session but pt reporting dizziness with mobility (requiring assist for balance).  Currently pt is SBA with bed mobility; CGA to min assist with transfers; and CGA to min assist to ambulate 30 feet with RW use (limited distance d/t dizziness and feeling of generalized weakness;  BP 140/88 with HR 67 bpm prior to mobility and BP 158/87 with HR 69 bpm post ambulation; symptoms resolved with sitting rest break).  Pt reports concerns regarding going home d/t dizziness (fall risk with mobility).  Pt would currently benefit from skilled PT to address noted impairments and functional limitations (see below for any additional details).  Upon hospital discharge, pt would benefit from ongoing therapy.    Recommendations for follow up therapy are one component of a multi-disciplinary discharge planning process, led by the attending physician.  Recommendations may be updated based on patient status, additional functional criteria and insurance authorization.  Follow Up Recommendations Can patient physically be transported by private vehicle: Yes     Assistance Recommended at Discharge Frequent or constant  Supervision/Assistance  Patient can return home with the following  A little help with walking and/or transfers;A little help with bathing/dressing/bathroom;Assistance with cooking/housework;Assist for transportation;Help with stairs or ramp for entrance    Equipment Recommendations Rolling walker (2 wheels);Wheelchair (measurements PT);Wheelchair cushion (measurements PT)  Recommendations for Other Services  OT consult    Functional Status Assessment Patient has had a recent decline in their functional status and demonstrates the ability to make significant improvements in function in a reasonable and predictable amount of time.     Precautions / Restrictions Precautions Precautions: Fall Precaution Comments: Peritoneal dialysis catheter L lower abdomen Restrictions Weight Bearing Restrictions: No      Mobility  Bed Mobility Overal bed mobility: Needs Assistance Bed Mobility: Supine to Sit     Supine to sit: Supervision, HOB elevated     General bed mobility comments: SBA for safety d/t dizziness    Transfers Overall transfer level: Needs assistance Equipment used: Rolling walker (2 wheels), None Transfers: Sit to/from Stand, Bed to chair/wheelchair/BSC Sit to Stand: Min guard, Min assist   Step pivot transfers: Min assist       General transfer comment: CGA to min assist for sit to stand transfers (x1 trial each from bed and from recliner); min assist stand step turn bed to recliner (no AD use); assist to steady d/t dizziness    Ambulation/Gait Ambulation/Gait assistance: Min guard, Min assist Gait Distance (Feet): 30 Feet Assistive device: Rolling walker (2 wheels)   Gait velocity: decreased     General Gait Details: partial step through gait pattern; assist to steady (d/t dizziness)  Stairs            Wheelchair Mobility    Modified Rankin (Stroke Patients  Only)       Balance Overall balance assessment: Needs assistance Sitting-balance  support: No upper extremity supported, Feet supported Sitting balance-Leahy Scale: Good Sitting balance - Comments: steady sitting reaching within BOS   Standing balance support: Bilateral upper extremity supported, During functional activity, Reliant on assistive device for balance Standing balance-Leahy Scale: Poor Standing balance comment: assist to steady d/t dizziness                             Pertinent Vitals/Pain Pain Assessment Pain Assessment: No/denies pain Vitals (HR and O2 on room air) stable and WFL throughout treatment session.    Home Living Family/patient expects to be discharged to:: Private residence Living Arrangements: Spouse/significant other Available Help at Discharge: Family;Available PRN/intermittently (wife works) Type of Home: House Home Access: Stairs to enter Entrance Stairs-Rails: Right;Left;Can reach both Technical brewer of Steps: 4   Home Layout: One level Home Equipment: [place] - single Barista (2 wheels);BSC/3in1      Prior Function Prior Level of Function : Independent/Modified Independent             Mobility Comments: Independent with ambulation; no recent falls reported.       Hand Dominance        Extremity/Trunk Assessment   Upper Extremity Assessment Upper Extremity Assessment: Generalized weakness    Lower Extremity Assessment Lower Extremity Assessment: Generalized weakness    Cervical / Trunk Assessment Cervical / Trunk Assessment: Normal  Communication   Communication: No difficulties  Cognition Arousal/Alertness: Awake/alert Behavior During Therapy: WFL for tasks assessed/performed Overall Cognitive Status: Within Functional Limits for tasks assessed                                          General Comments  Nursing cleared pt for participation in physical therapy.  Pt agreeable to PT session.    Exercises     Assessment/Plan    PT Assessment Patient  needs continued PT services  PT Problem List Decreased strength;Decreased activity tolerance;Decreased balance;Decreased mobility;Decreased knowledge of use of DME;Decreased knowledge of precautions       PT Treatment Interventions DME instruction;Gait training;Stair training;Functional mobility training;Therapeutic activities;Therapeutic exercise;Balance training;Patient/family education    PT Goals (Current goals can be found in the Care Plan section)  Acute Rehab PT Goals Patient Stated Goal: to improve dizziness and mobility PT Goal Formulation: With patient Time For Goal Achievement: 05/07/22 Potential to Achieve Goals: Good    Frequency Min 2X/week     Co-evaluation               AM-PAC PT "6 Clicks" Mobility  Outcome Measure Help needed turning from your back to your side while in a flat bed without using bedrails?: None Help needed moving from lying on your back to sitting on the side of a flat bed without using bedrails?: A Little Help needed moving to and from a bed to a chair (including a wheelchair)?: A Little Help needed standing up from a chair using your arms (e.g., wheelchair or bedside chair)?: A Little Help needed to walk in hospital room?: A Little Help needed climbing 3-5 steps with a railing? : A Lot 6 Click Score: 18    End of Session Equipment Utilized During Treatment: Gait belt (up high away from peritoneal dialysis site) Activity Tolerance: Other (comment) (Limited d/t weakness and  dizziness) Patient left: in chair;with call bell/phone within reach;with chair alarm set Nurse Communication: Mobility status;Precautions;Other (comment) (pt's dizziness and BP) PT Visit Diagnosis: Unsteadiness on feet (R26.81);Muscle weakness (generalized) (M62.81);Other abnormalities of gait and mobility (R26.89);Dizziness and giddiness (R42)    Time: [id number] PT Time Calculation (min) (ACUTE ONLY): 26 min   Charges:   PT Evaluation $PT Eval Low Complexity: 1  Low PT Treatments $Therapeutic Activity: 8-22 mins       Leitha Bleak, PT 04/23/22, 12:34 PM

## 2022-04-23 NOTE — Plan of Care (Signed)
  Problem: Coping: Goal: Ability to adjust to condition or change in health will improve Outcome: Progressing   Problem: Fluid Volume: Goal: Ability to maintain a balanced intake and output will improve Outcome: Progressing   Problem: Health Behavior/Discharge Planning: Goal: Ability to manage health-related needs will improve Outcome: Progressing   Problem: Skin Integrity: Goal: Risk for impaired skin integrity will decrease Outcome: Progressing   Problem: Nutritional: Goal: Maintenance of adequate nutrition will improve Outcome: Progressing   Problem: Activity: Goal: Risk for activity intolerance will decrease Outcome: Progressing   Problem: Nutrition: Goal: Adequate nutrition will be maintained Outcome: Progressing   Problem: Pain Managment: Goal: General experience of comfort will improve Outcome: Progressing   Problem: Safety: Goal: Ability to remain free from injury will improve Outcome: Progressing   Problem: Skin Integrity: Goal: Risk for impaired skin integrity will decrease Outcome: Progressing

## 2022-04-23 NOTE — Care Management Important Message (Signed)
Important Message  Patient Details  Name: Casey French MRN: [id number] Date of Birth: [birth year]   Medicare Important Message Given:  Yes     Dannette Barbara 04/23/2022, 11:28 AM

## 2022-04-23 NOTE — Progress Notes (Signed)
   Peritoneal Dialysis Treatment Disconnect Note     Post Treatment Weight: 61..2kg   Consent signed and in chart.  PD treatment Disconnected via aseptic technique.    Patient is awake and alert. No complaints of pain.    PD exit site clean, dry and intact.     Hand-off given to the patient's nurse.       Darrol Jump LPN [facility]

## 2022-04-23 NOTE — Progress Notes (Signed)
Progress Note   Patient: Casey French [id number] DOB: [birth year] DOA: 04/18/2022     5 DOS: the patient was seen and examined on 04/23/2022   Subjective:  Patient seen and examined at bedside this morning Patient has no more abdominal pain Patient complained of dizziness today Denies nausea vomiting chest pain or cough     Brief hospital course: SABREE ZAVALA is a 72 y.o. male with medical history significant of coronary artery disease, diabetes, GERD, history of GI bleed, essential hypertension, hyperlipidemia, gout, depression, persistent atrial fibrillation on chronic anticoagulation who was brought in from home with nausea vomiting diarrhea as well as abdominal pain. Patient came to the ER where his blood pressure was 200/110.   Imaging showed acute uncomplicated sigmoid diverticulitis.       Assessment and Plan:   #acute uncomplicated sigmoid diverticulitis: Patient with multiple comorbidities.   Continue current antibiotic therapy Continue pain management and nausea management.   Abdominal pain improved Infectious disease recommends Augmentin at discharge  # Gram-positive bacteremia Vancomycin have been discontinued Staph hominis bacteremia thought to be due to contaminant by infectious disease MRI of the thoracic and lumbar spine did not show any evidence of discitis or osteomyelitis Infectious disease recommended discontinuation of vancomycin   Dizziness of unclear etiology Meclizine has been added PT have been requested We will check orthostatic vitals  # end-stage renal disease on peritoneal dialysis:  Consulted nephrology for dialysis needs   #hypokalemia: Continue repletion and monitoring   # Syncopal episode likely secondary to above Treatment as above    # lactic acidosis: Most likely as a result of infection. Patient received a course of IV fluid which is currently discontinued   # diabetes:  Continue sliding scale insulin.   # GERD:  Continue PPIs.   # hypertensive urgency: Systolic of [id number] on presentation.   Blood pressure improved  Continue IV hydralazine or labetalol. Continue metoprolol, isosorbide mononitrate Monitor blood pressure closely   # hyperlipidemia: Continue statin   #coronary artery disease: No acute decompensation.    Advance Care Planning:   Code Status: Full Code    Consults: Nephrology consulted as well as clinical pharmacist   Family Communication: Wife     Physical Exam: Constitutional: Elderly male lying in bed, in no acute distress Eyes: PERRL, lids and conjunctivae normal ENMT: Mucous membranes are moist.  Neck: normal, supple, no masses, no thyromegaly Respiratory: clear to auscultation bilaterally,  Cardiovascular: Regular rate and rhythm,  Abdomen: Abdominal tenderness resolved no masses palpated. Peritoneal dialysis catheter in place Musculoskeletal: Good range of motion, no joint swelling or tenderness, Skin: no rashes, lesions, ulcers. No induration Neurologic: CN 2-12 grossly intact.      Data Reviewed: CBC and BMP  reviewed by me today showing improvement hypokalemia with potassium of 3.1     Family Communication: No family member at bedside   Disposition: Status is: Inpatient Team continues to meet inpatient criteria as he is currently requiring IV antibiotics     Planned Discharge Destination: Prior living condition   Time spent: 38 minutes     Vitals:   04/22/22 1700 04/22/22 2016 04/23/22 0420 04/23/22 0731  BP:  124/73 120/77 136/74  Pulse:  72 69 70  Resp:  18 20 18   Temp:  98.1 F (36.7 C) 98.2 F (36.8 C) 97.7 F (36.5 C)  TempSrc:  Oral    SpO2:  98% 97% 95%  Weight: 61.6 kg     Height:  Author: Verline Lema, MD 04/23/2022 2:26 PM  For on call review [email or web address].

## 2022-04-23 NOTE — Progress Notes (Signed)
PD tx initation note  Pre TX VS:see table below   04/23/22 2150  Vitals  Temp 99.1 F (37.3 C)  Temp Source Oral  BP 123/71  MAP (mmHg) 87  BP Location Left Arm  BP Method Automatic  Patient Position (if appropriate) Lying  Pulse Rate 75  Pulse Rate Source Monitor  ECG Heart Rate 74  Resp (!) 22  Level of Consciousness  Level of Consciousness Alert  MEWS COLOR  MEWS Score Color Green  MEWS Score  MEWS Temp 0  MEWS Systolic 0  MEWS Pulse 0  MEWS RR 1  MEWS LOC 0  MEWS Score 1     Pre TX weight: unable to get weight  PD treatment initiated via aseptic technique. Consent signed and in chart. Patient is alert and oriented. No complaints of pain. No specimen collected. PD exit site clean, dry and intact. Gentamycin and new dressing applied. Bedside RN educated on PD machine and how to contact tech support when PD machine alarms.  Riccardo Dubin RN [facility]

## 2022-04-23 NOTE — Progress Notes (Signed)
[facility]  ROUNDING NOTE   Subjective:   Casey French is a 72 year old male with past medical conditions including GERD, diabetes, CAD, hypertension, gout, hyperlipidemia, atrial fibrillation, and end-stage renal disease on peritoneal dialysis.  Patient presents to the emergency department with complaints of nausea, vomiting, diarrhea, and abdominal pain.  Patient has been admitted for Diverticulitis [K57.92] Acute diverticulitis [K57.92]  Patient is known to our practice and is followed by [facility], Dr. Juleen China for peritoneal dialysis.    Patient resting in bed Alert and oriented Continues to have dizziness.   Peritoneal dialysis last night. Tolerated treatment well.   Objective:  Vital signs in last 24 hours:  Temp:  [97.7 F (36.5 C)-98.2 F (36.8 C)] 97.7 F (36.5 C) (03/29 0731) Pulse Rate:  [69-72] 70 (03/29 0731) Resp:  [18-20] 18 (03/29 0731) BP: (120-136)/(73-77) 136/74 (03/29 0731) SpO2:  [95 %-98 %] 95 % (03/29 0731) Weight:  [61.6 kg] 61.6 kg (03/28 1700)  Weight change: -0.3 kg Filed Weights   04/22/22 0500 04/22/22 0730 04/22/22 1700  Weight: 65.8 kg 60.5 kg 61.6 kg    Intake/Output: I/O last 3 completed shifts: In: 1360 [P.O.:960; IV Piggyback:400] Out: 2097 [Urine:1750; Other:347]   Intake/Output this shift:  Total I/O In: 347 [P.O.:240; IV Piggyback:107] Out: 453 [Urine:350; Other:103]  Physical Exam: General: NAD, laying in bed  Head: Normocephalic, atraumatic. Dry oral mucosal membranes  Eyes: Anicteric  Lungs:  Clear to auscultation, normal effort  Heart: Regular rate and rhythm  Abdomen:  Soft, nontender. [place]  Extremities:  No peripheral edema.  Neurologic: Alert and oriented, moving all four extremities  Skin: No lesions  Access: [facility]    Basic Metabolic Panel: Recent Labs  Lab 04/18/22 1825 04/19/22 0506 04/20/22 0600 04/21/22 0604 04/22/22 0636 04/23/22 0518  NA  --  140 137 140 140 137  K  --  3.9  2.9* 3.5 3.1* 3.2*  CL  --  107 107 102 107 104  CO2  --  21* 23 25 24 24   GLUCOSE  --  102* 94 95 89 97  BUN  --  44* 35* 25* 21 18  CREATININE  --  5.58* 4.80* 4.39* 4.32* 4.29*  CALCIUM  --  8.5* 7.5* 7.9* 7.8* 8.0*  MG 1.9  --   --   --   --   --      Liver Function Tests: Recent Labs  Lab 04/18/22 1809 04/19/22 0506  AST 26 18  ALT 14 12  ALKPHOS 124 103  BILITOT 1.1 0.4  PROT 6.8 5.9*  ALBUMIN 3.0* 2.6*    Recent Labs  Lab 04/18/22 1809  LIPASE 63*    No results for input(s): "AMMONIA" in the last 168 hours.  CBC: Recent Labs  Lab 04/19/22 0506 04/20/22 0600 04/21/22 0604 04/22/22 0636 04/23/22 0518  WBC 9.4 9.6 10.1 7.3 7.8  NEUTROABS  --  7.3 8.1* 5.4 6.0  HGB 10.6* 9.1* 9.4* 9.1* 9.6*  HCT 31.9* 27.7* 28.3* 27.4* 29.2*  MCV 96.1 96.5 95.9 96.1 95.4  PLT 240 211 213 222 254     Cardiac Enzymes: No results for input(s): "CKTOTAL", "CKMB", "CKMBINDEX", "TROPONINI" in the last 168 hours.  BNP: Invalid input(s): "POCBNP"  CBG: Recent Labs  Lab 04/22/22 1132 04/22/22 1627 04/23/22 0010 04/23/22 0816 04/23/22 1147  GLUCAP 156* 69* 116* 61 115*     Microbiology: Results for orders placed or performed during the hospital encounter of 04/18/22  Resp panel by RT-PCR (  RSV, Flu A&B, Covid) Anterior Nasal Swab     Status: None   Collection Time: 04/18/22  6:14 PM   Specimen: Anterior Nasal Swab  Result Value Ref Range Status   SARS Coronavirus 2 by RT PCR NEGATIVE NEGATIVE Final    Comment: (NOTE) SARS-CoV-2 target nucleic acids are NOT DETECTED.  The SARS-CoV-2 RNA is generally detectable in upper respiratory specimens during the acute phase of infection. The lowest concentration of SARS-CoV-2 viral copies this assay can detect is 138 copies/mL. A negative result does not preclude SARS-Cov-2 infection and should not be used as the sole basis for treatment or other patient management decisions. A negative result may occur with  improper  specimen collection/handling, submission of specimen other than nasopharyngeal swab, presence of viral mutation(s) within the areas targeted by this assay, and inadequate number of viral copies(<138 copies/mL). A negative result must be combined with clinical observations, patient history, and epidemiological information. The expected result is Negative.  Fact Sheet for Patients:  EntrepreneurPulse.com.au  Fact Sheet for Healthcare Providers:  IncredibleEmployment.be  This test is no t yet approved or cleared by the [place] FDA and  has been authorized for detection and/or diagnosis of SARS-CoV-2 by FDA under an Emergency Use Authorization (EUA). This EUA will remain  in effect (meaning this test can be used) for the duration of the COVID-19 declaration under Section 564(b)(1) of the Act, 21 U.S.C.section 360bbb-3(b)(1), unless the authorization is terminated  or revoked sooner.       Influenza A by PCR NEGATIVE NEGATIVE Final   Influenza B by PCR NEGATIVE NEGATIVE Final    Comment: (NOTE) The Xpert Xpress SARS-CoV-2/FLU/RSV plus assay is intended as an aid in the diagnosis of influenza from Nasopharyngeal swab specimens and should not be used as a sole basis for treatment. Nasal washings and aspirates are unacceptable for Xpert Xpress SARS-CoV-2/FLU/RSV testing.  Fact Sheet for Patients: EntrepreneurPulse.com.au  Fact Sheet for Healthcare Providers: IncredibleEmployment.be  This test is not yet approved or cleared by the [place] FDA and has been authorized for detection and/or diagnosis of SARS-CoV-2 by FDA under an Emergency Use Authorization (EUA). This EUA will remain in effect (meaning this test can be used) for the duration of the COVID-19 declaration under Section 564(b)(1) of the Act, 21 U.S.C. section 360bbb-3(b)(1), unless the authorization is terminated or revoked.     Resp  Syncytial Virus by PCR NEGATIVE NEGATIVE Final    Comment: (NOTE) Fact Sheet for Patients: EntrepreneurPulse.com.au  Fact Sheet for Healthcare Providers: IncredibleEmployment.be  This test is not yet approved or cleared by the [place] FDA and has been authorized for detection and/or diagnosis of SARS-CoV-2 by FDA under an Emergency Use Authorization (EUA). This EUA will remain in effect (meaning this test can be used) for the duration of the COVID-19 declaration under Section 564(b)(1) of the Act, 21 U.S.C. section 360bbb-3(b)(1), unless the authorization is terminated or revoked.  Performed at [facility], [address] [place], [place] [address]   Blood Culture (routine x 2)     Status: Abnormal   Collection Time: 04/18/22  6:40 PM   Specimen: BLOOD  Result Value Ref Range Status   Specimen Description   Final    BLOOD BLOOD RIGHT HAND Performed at [facility], [address] [place], [place] [address]    Special Requests   Final    BOTTLES DRAWN AEROBIC AND ANAEROBIC Blood Culture adequate volume Performed at [facility], [place],  [place], [place] [address]    Culture  Setup Time   Final    GRAM POSITIVE COCCI IN BOTH AEROBIC AND ANAEROBIC BOTTLES CRITICAL RESULT CALLED TO, READ BACK BY AND VERIFIED WITH: C/DEVON MITCHELL 04/19/22 0924 SLM    Culture (A)  Final    STAPHYLOCOCCUS HOMINIS Two isolates with different morphologies were identified as the same organism.The most resistant organism was reported. Performed at [facility], [place] [address] [place], [place] [address]    Report Status 04/22/2022 FINAL  Final   Organism ID, Bacteria STAPHYLOCOCCUS HOMINIS  Final      Susceptibility   Staphylococcus hominis - MIC*    CIPROFLOXACIN 2 INTERMEDIATE Intermediate     ERYTHROMYCIN >=8 RESISTANT Resistant     GENTAMICIN <=0.5 SENSITIVE Sensitive     OXACILLIN <=0.25 SENSITIVE  Sensitive     TETRACYCLINE >=16 RESISTANT Resistant     VANCOMYCIN <=0.5 SENSITIVE Sensitive     TRIMETH/SULFA <=10 SENSITIVE Sensitive     CLINDAMYCIN <=0.25 SENSITIVE Sensitive     RIFAMPIN <=0.5 SENSITIVE Sensitive     Inducible Clindamycin NEGATIVE Sensitive     * STAPHYLOCOCCUS HOMINIS  Blood Culture (routine x 2)     Status: Abnormal   Collection Time: 04/18/22  6:40 PM   Specimen: BLOOD  Result Value Ref Range Status   Specimen Description   Final    BLOOD BLOOD LEFT ARM Performed at [facility], [address] [place], [place] [address]    Special Requests   Final    BOTTLES DRAWN AEROBIC AND ANAEROBIC Blood Culture adequate volume Performed at [facility], [address] [place], [place] [address]    Culture  Setup Time   Final    GRAM POSITIVE COCCI AEROBIC BOTTLE ONLY CRITICAL VALUE NOTED.  VALUE IS CONSISTENT WITH PREVIOUSLY REPORTED AND CALLED VALUE. Performed at [facility], [address] [place], [place] [address]    Culture STAPHYLOCOCCUS HOMINIS (A)  Final   Report Status 04/23/2022 FINAL  Final   Organism ID, Bacteria STAPHYLOCOCCUS HOMINIS  Final      Susceptibility   Staphylococcus hominis - MIC*    CIPROFLOXACIN <=0.5 SENSITIVE Sensitive     ERYTHROMYCIN >=8 RESISTANT Resistant     GENTAMICIN <=0.5 SENSITIVE Sensitive     OXACILLIN <=0.25 SENSITIVE Sensitive     TETRACYCLINE <=1 SENSITIVE Sensitive     VANCOMYCIN <=0.5 SENSITIVE Sensitive     TRIMETH/SULFA <=10 SENSITIVE Sensitive     CLINDAMYCIN <=0.25 SENSITIVE Sensitive     RIFAMPIN <=0.5 SENSITIVE Sensitive     Inducible Clindamycin NEGATIVE Sensitive     * STAPHYLOCOCCUS HOMINIS  Blood Culture ID Panel (Reflexed)     Status: Abnormal   Collection Time: 04/18/22  6:40 PM  Result Value Ref Range Status   Enterococcus faecalis NOT DETECTED NOT DETECTED Final   Enterococcus Faecium NOT DETECTED NOT DETECTED Final   Listeria monocytogenes NOT DETECTED NOT  DETECTED Final   Staphylococcus species DETECTED (A) NOT DETECTED Final    Comment: CRITICAL RESULT CALLED TO, READ BACK BY AND VERIFIED WITH: C/DEVON MITCHELL 04/19/22 0924 SLM    Staphylococcus aureus (BCID) NOT DETECTED NOT DETECTED Final   Staphylococcus epidermidis NOT DETECTED NOT DETECTED Final   Staphylococcus lugdunensis NOT DETECTED NOT DETECTED Final   Streptococcus species NOT DETECTED NOT DETECTED Final   Streptococcus agalactiae NOT DETECTED NOT DETECTED Final   Streptococcus pneumoniae NOT DETECTED NOT DETECTED Final   Streptococcus pyogenes NOT DETECTED NOT DETECTED  Final   A.calcoaceticus-baumannii NOT DETECTED NOT DETECTED Final   Bacteroides fragilis NOT DETECTED NOT DETECTED Final   Enterobacterales NOT DETECTED NOT DETECTED Final   Enterobacter cloacae complex NOT DETECTED NOT DETECTED Final   Escherichia coli NOT DETECTED NOT DETECTED Final   Klebsiella aerogenes NOT DETECTED NOT DETECTED Final   Klebsiella oxytoca NOT DETECTED NOT DETECTED Final   Klebsiella pneumoniae NOT DETECTED NOT DETECTED Final   Proteus species NOT DETECTED NOT DETECTED Final   Salmonella species NOT DETECTED NOT DETECTED Final   Serratia marcescens NOT DETECTED NOT DETECTED Final   Haemophilus influenzae NOT DETECTED NOT DETECTED Final   Neisseria meningitidis NOT DETECTED NOT DETECTED Final   Pseudomonas aeruginosa NOT DETECTED NOT DETECTED Final   Stenotrophomonas maltophilia NOT DETECTED NOT DETECTED Final   Candida albicans NOT DETECTED NOT DETECTED Final   Candida auris NOT DETECTED NOT DETECTED Final   Candida glabrata NOT DETECTED NOT DETECTED Final   Candida krusei NOT DETECTED NOT DETECTED Final   Candida parapsilosis NOT DETECTED NOT DETECTED Final   Candida tropicalis NOT DETECTED NOT DETECTED Final   Cryptococcus neoformans/gattii NOT DETECTED NOT DETECTED Final    Comment: Performed at [facility], [address] [place], [place] [address]  Culture, blood  (Routine X 2) w Reflex to ID Panel     Status: None (Preliminary result)   Collection Time: 04/19/22  3:56 PM   Specimen: BLOOD  Result Value Ref Range Status   Specimen Description BLOOD BLOOD RIGHT ARM  Final   Special Requests   Final    BOTTLES DRAWN AEROBIC AND ANAEROBIC Blood Culture results may not be optimal due to an excessive volume of blood received in culture bottles   Culture   Final    NO GROWTH 4 DAYS Performed at [facility], [address] [place], [place] [address]    Report Status PENDING  Incomplete  Culture, blood (Routine X 2) w Reflex to ID Panel     Status: None (Preliminary result)   Collection Time: 04/19/22  3:58 PM   Specimen: BLOOD  Result Value Ref Range Status   Specimen Description BLOOD BLOOD RIGHT WRIST  Final   Special Requests   Final    BOTTLES DRAWN AEROBIC AND ANAEROBIC Blood Culture results may not be optimal due to an inadequate volume of blood received in culture bottles   Culture   Final    NO GROWTH 4 DAYS Performed at [facility], [address] [place], [place] [address]    Report Status PENDING  Incomplete  Body fluid culture w Gram Stain     Status: None   Collection Time: 04/19/22  6:54 PM   Specimen: Peritoneal Washings; Body Fluid  Result Value Ref Range Status   Specimen Description   Final    PERITONEAL Performed at [facility], [address] [place], [place] [address]    Special Requests   Final    PERITONEAL Performed at [facility], [address] [place], [place] [address]    Gram Stain   Final    RARE WBC PRESENT, PREDOMINANTLY MONONUCLEAR NO ORGANISMS SEEN CYTOSPIN SMEAR    Culture   Final    NO GROWTH 3 DAYS Performed at [facility], [place] [address] [place], [place] [address]    Report Status 04/23/2022 FINAL  Final    Coagulation Studies: No results for input(s): "LABPROT", "INR" in the last 72 hours.  Urinalysis: No results for  input(s):  "COLORURINE", "LABSPEC", "PHURINE", "GLUCOSEU", "HGBUR", "BILIRUBINUR", "KETONESUR", "PROTEINUR", "UROBILINOGEN", "NITRITE", "LEUKOCYTESUR" in the last 72 hours.  Invalid input(s): "APPERANCEUR"     Imaging: MR THORACIC SPINE WO CONTRAST  Result Date: 04/22/2022 CLINICAL DATA:  Mid-back pain, infection suspected, no prior imaging; Low back pain, infection suspected, no prior imaging. EXAM: MRI THORACIC AND LUMBAR SPINE WITHOUT CONTRAST TECHNIQUE: Multiplanar and multiecho pulse sequences of the thoracic and lumbar spine were obtained without intravenous contrast. COMPARISON:  MRI thoracic spine 02/22/2016. CT chest/abdomen/pelvis 04/18/2022. FINDINGS: MRI THORACIC SPINE FINDINGS Alignment:  Normal. Vertebrae: No fracture, evidence of discitis, or bone lesion. Cord:  Normal cord signal and volume. Paraspinal and other soft tissues: Trace bilateral pleural effusions. Disc levels: No significant degenerative change. MRI LUMBAR SPINE FINDINGS Segmentation:  Standard. Alignment:  Normal. Vertebrae:  No fracture, evidence of discitis, or bone lesion. Conus medullaris and cauda equina: Conus extends to the T12-L1 level. Conus and cauda equina appear normal. Paraspinal and other soft tissues: Atrophic kidneys. Moderate fatty atrophy of the paraspinal muscles. Disc levels: No disc herniation or spinal canal stenosis. Mild bilateral facet arthropathy at L5-S1. IMPRESSION: 1. No evidence of discitis-osteomyelitis in the thoracic or lumbar spine. 2. No spinal canal or neural foraminal stenosis. 3. Trace bilateral pleural effusions. Electronically Signed   By: [place]it Alexanders M.D.   On: 04/22/2022 11:55   MR LUMBAR SPINE WO CONTRAST  Result Date: 04/22/2022 CLINICAL DATA:  Mid-back pain, infection suspected, no prior imaging; Low back pain, infection suspected, no prior imaging. EXAM: MRI THORACIC AND LUMBAR SPINE WITHOUT CONTRAST TECHNIQUE: Multiplanar and multiecho pulse sequences of the thoracic and lumbar spine  were obtained without intravenous contrast. COMPARISON:  MRI thoracic spine 02/22/2016. CT chest/abdomen/pelvis 04/18/2022. FINDINGS: MRI THORACIC SPINE FINDINGS Alignment:  Normal. Vertebrae: No fracture, evidence of discitis, or bone lesion. Cord:  Normal cord signal and volume. Paraspinal and other soft tissues: Trace bilateral pleural effusions. Disc levels: No significant degenerative change. MRI LUMBAR SPINE FINDINGS Segmentation:  Standard. Alignment:  Normal. Vertebrae:  No fracture, evidence of discitis, or bone lesion. Conus medullaris and cauda equina: Conus extends to the T12-L1 level. Conus and cauda equina appear normal. Paraspinal and other soft tissues: Atrophic kidneys. Moderate fatty atrophy of the paraspinal muscles. Disc levels: No disc herniation or spinal canal stenosis. Mild bilateral facet arthropathy at L5-S1. IMPRESSION: 1. No evidence of discitis-osteomyelitis in the thoracic or lumbar spine. 2. No spinal canal or neural foraminal stenosis. 3. Trace bilateral pleural effusions. Electronically Signed   By: Emmit Alexanders M.D.   On: 04/22/2022 11:55     Medications:    cefTRIAXone (ROCEPHIN) 1,200 mg in dialysis solution 1.5% low-MG/low-CA 3,000 mL dialysis solution     dialysis solution 1.5% low-MG/low-CA     metronidazole 500 mg (04/23/22 1038)    apixaban  2.5 mg Oral BID   aspirin EC  81 mg Oral Daily   calcitRIOL  0.25 mcg Oral Daily   calcium carbonate  2 tablet Oral Daily   folic acid  1 mg Oral Daily   gentamicin cream  1 Application Topical Daily   insulin aspart  0-5 Units Subcutaneous QHS   insulin aspart  0-9 Units Subcutaneous TID WC   isosorbide mononitrate  60 mg Oral Daily   meclizine  25 mg Oral BID   metoprolol succinate  100 mg Oral Daily   pantoprazole  40 mg Oral Daily   psyllium  1 packet Oral BID   rosuvastatin  40 mg Oral Daily  morphine injection, nitroGLYCERIN, ondansetron **OR** ondansetron (ZOFRAN) IV  Assessment/ Plan:  Casey French is a 72 y.o.  male with past medical conditions including GERD, diabetes, CAD, hypertension, gout, hyperlipidemia, atrial fibrillation, and end-stage renal disease on peritoneal dialysis.  Patient presents to the emergency department with complaints of nausea, vomiting, diarrhea, and abdominal pain.  Patient has been admitted for Diverticulitis [K57.92] Acute diverticulitis [K57.92]  [facility] C[facility]D Fills: 2.5L/ 4 exchanges/ 1.5hr dwell. No last fill  End stage renal disease on peritoneal dialysis.   Peritoneal dialysis for tonight. Orders prepared Will continue nightly treatments with 1.5% dialysate.   2. Anemia of chronic kidney disease Lab Results  Component Value Date   HGB 9.6 (L) 04/23/2022  Patient receives Mircera monthly at outpatient clinic. Hemoglobin stable  3. Secondary Hyperparathyroidism: with outpatient labs: PTH 854, phosphorus 5.3, calcium 7.8 on 03/26/22.   Lab Results  Component Value Date   CALCIUM 8.0 (L) 04/23/2022   PHOS 4.5 03/08/2021    Patient receives calcitriol, cholecalciferol, and calcium carbonate outpatient.  Calcitriol and calcium carbonate resumed.  4. Hypertension with chronic kidney disease. Home regimen includes losartan, amlodipine, isosorbide, and metoprolol. Amlodipine and losartan held  Blood pressure stable for this patient  5. Diverticulitis, uncomplicated. Mild diverticulitis seen on CT. Cell count borderline, due to risk of infection,   Change ceftriaxone 1g IP daily for a total of 3 weeks.    LOS: 5 Carold Eisner 3/29/20242:11 PM

## 2022-04-24 DIAGNOSIS — K5792 Diverticulitis of intestine, part unspecified, without perforation or abscess without bleeding: Secondary | ICD-10-CM | POA: Diagnosis not present

## 2022-04-24 LAB — CULTURE, BLOOD (ROUTINE X 2)
Culture: NO GROWTH
Culture: NO GROWTH

## 2022-04-24 LAB — CBC WITH DIFFERENTIAL/PLATELET
Abs Immature Granulocytes: 0.14 10*3/uL — ABNORMAL HIGH (ref 0.00–0.07)
Basophils Absolute: 0.1 10*3/uL (ref 0.0–0.1)
Basophils Relative: 1 %
Eosinophils Absolute: 0.2 10*3/uL (ref 0.0–0.5)
Eosinophils Relative: 2 %
HCT: 29.2 % — ABNORMAL LOW (ref 39.0–52.0)
Hemoglobin: 9.4 g/dL — ABNORMAL LOW (ref 13.0–17.0)
Immature Granulocytes: 2 %
Lymphocytes Relative: 11 %
Lymphs Abs: 0.9 10*3/uL (ref 0.7–4.0)
MCH: 31.5 pg (ref 26.0–34.0)
MCHC: 32.2 g/dL (ref 30.0–36.0)
MCV: 98 fL (ref 80.0–100.0)
Monocytes Absolute: 0.8 10*3/uL (ref 0.1–1.0)
Monocytes Relative: 10 %
Neutro Abs: 5.8 10*3/uL (ref 1.7–7.7)
Neutrophils Relative %: 74 %
Platelets: 265 10*3/uL (ref 150–400)
RBC: 2.98 MIL/uL — ABNORMAL LOW (ref 4.22–5.81)
RDW: 12.8 % (ref 11.5–15.5)
WBC: 7.9 10*3/uL (ref 4.0–10.5)
nRBC: 0 % (ref 0.0–0.2)

## 2022-04-24 LAB — BASIC METABOLIC PANEL
Anion gap: 11 (ref 5–15)
BUN: 18 mg/dL (ref 8–23)
CO2: 23 mmol/L (ref 22–32)
Calcium: 8.1 mg/dL — ABNORMAL LOW (ref 8.9–10.3)
Chloride: 105 mmol/L (ref 98–111)
Creatinine, Ser: 4.32 mg/dL — ABNORMAL HIGH (ref 0.61–1.24)
GFR, Estimated: 14 mL/min — ABNORMAL LOW (ref >60)
Glucose, Bld: 106 mg/dL — ABNORMAL HIGH (ref 70–99)
Potassium: 3.6 mmol/L (ref 3.5–5.1)
Sodium: 139 mmol/L (ref 135–145)

## 2022-04-24 LAB — GLUCOSE, CAPILLARY
Glucose-Capillary: 72 mg/dL (ref 70–99)
Glucose-Capillary: 117 mg/dL — ABNORMAL HIGH (ref 70–99)

## 2022-04-24 MED ORDER — CALCITRIOL 0.25 MCG PO CAPS: 0.2500 ug | ORAL_CAPSULE | Freq: Every day | ORAL | 2 refills | Status: DC

## 2022-04-24 MED ORDER — MECLIZINE HCL 25 MG PO TABS: 25.0000 mg | ORAL_TABLET | Freq: Two times a day (BID) | ORAL | 0 refills | Status: DC

## 2022-04-24 MED ORDER — AMOXICILLIN-POT CLAVULANATE 875-125 MG PO TABS: 1.0000 | ORAL_TABLET | Freq: Two times a day (BID) | ORAL | 0 refills | Status: DC

## 2022-04-24 MED ORDER — AMOXICILLIN-POT CLAVULANATE 500-125 MG PO TABS
1.0000 | ORAL_TABLET | Freq: Two times a day (BID) | ORAL | Status: DC
  Administered 2022-04-24: 1 via ORAL
  Filled 2022-04-24: qty 1

## 2022-04-24 MED ORDER — AMOXICILLIN-POT CLAVULANATE 875-125 MG PO TABS: 1.0000 | ORAL_TABLET | Freq: Two times a day (BID) | ORAL | Status: DC

## 2022-04-24 MED ORDER — AMOXICILLIN-POT CLAVULANATE 250-62.5 MG/5ML PO SUSR: 500.0000 mg | Freq: Two times a day (BID) | ORAL | 0 refills | Status: AC

## 2022-04-24 NOTE — Discharge Summary (Signed)
Physician Discharge Summary   Patient: Casey French MRN: [id number] DOB: 1950-09-08  Admit date:     04/18/2022  Discharge date: 04/24/22  Discharge Physician: Verline Lema   PCP: [name], MD    Discharge Diagnoses: #acute uncomplicated sigmoid diverticulitis: # Gram-positive bacteremia likely contaminant Dizziness of unclear etiology # end-stage renal disease on peritoneal dialysis:  #hypokalemia:  # Syncopal episode likely secondary to above # lactic acidosis:  # diabetes:  # GERD:  # hypertensive urgency:  # hyperlipidemia:  #coronary artery disease:      Hospital Course: Casey French is a 72 y.o. male with medical history significant of coronary artery disease, diabetes, GERD, history of GI bleed, essential hypertension, hyperlipidemia, gout, depression, persistent atrial fibrillation on chronic anticoagulation who was brought in from home with nausea vomiting diarrhea as well as abdominal pain. Patient came to the ER where his blood pressure was 200/110.   Imaging showed acute uncomplicated sigmoid diverticulitis.  Blood culture shows Staph hominis which was interpreted by infectious disease to be contaminant.  Patient improved on hospitalization and therefore being discharged to complete a course of Augmentin for his acute diverticulitis and he will follow-up with nephrology.   Consultants: Infectious disease, nephrology Procedures performed: Peritoneal dialysis Disposition: Home Diet recommendation:  Discharge Diet Orders (From admission, onward)     Start     Ordered   04/24/22 0000  Diet - low sodium heart healthy        04/24/22 1233           Cardiac diet DISCHARGE MEDICATION: Allergies as of 04/24/2022       Reactions   Morphine Anxiety   agitation        Medication List     STOP taking these medications    amLODipine 5 MG tablet Commonly known as: NORVASC   losartan 100 MG tablet Commonly known as: COZAAR        TAKE these medications    allopurinol 100 MG tablet Commonly known as: ZYLOPRIM Take 100 mg by mouth daily.   amoxicillin-clavulanate 875-125 MG tablet Commonly known as: AUGMENTIN Take 1 tablet by mouth every 12 (twelve) hours for 10 days.   apixaban 2.5 MG Tabs tablet Commonly known as: ELIQUIS Take 2.5 mg by mouth 2 (two) times daily.   aspirin EC 81 MG tablet Take 81 mg by mouth daily.   calcitRIOL 0.25 MCG capsule Commonly known as: ROCALTROL Take 1 capsule (0.25 mcg total) by mouth daily. Start taking on: March 31, 123456   folic acid 1 MG tablet Commonly known as: FOLVITE TAKE 2 TABLETS(2 MG) BY MOUTH DAILY What changed: See the new instructions.   gabapentin 300 MG capsule Commonly known as: NEURONTIN Take 300 mg by mouth at bedtime.   gentamicin cream 0.1 % Commonly known as: GARAMYCIN Apply 1 application topically daily.   hydrocortisone 2.5 % rectal cream Commonly known as: ANUSOL-HC Place 1 application rectally 2 (two) times daily as needed for hemorrhoids or anal itching.   isosorbide mononitrate 60 MG 24 hr tablet Commonly known as: IMDUR Take 60 mg by mouth daily.   meclizine 25 MG tablet Commonly known as: ANTIVERT Take 1 tablet (25 mg total) by mouth 2 (two) times daily.   methocarbamol 500 MG tablet Commonly known as: ROBAXIN Take 500 mg by mouth 3 (three) times daily as needed.   metoprolol succinate 100 MG 24 hr tablet Commonly known as: TOPROL-XL Take 100 mg by mouth daily. Take with or  immediately following a meal.   nitroGLYCERIN 0.4 MG SL tablet Commonly known as: NITROSTAT Place 0.4 mg under the tongue every 5 (five) minutes as needed for chest pain.   omeprazole 20 MG capsule Commonly known as: PRILOSEC Take 20 mg by mouth daily.   psyllium 58.6 % packet Commonly known as: METAMUCIL Take 1 packet by mouth 2 (two) times daily.   rosuvastatin 40 MG tablet Commonly known as: CRESTOR Take 40 mg by mouth daily.   Vitamin D  (Ergocalciferol) 1.25 MG (50000 UNIT) Caps capsule Commonly known as: DRISDOL Take 50,000 Units by mouth once a week.        Follow-up Information     Kolluru, Lurena Nida, MD. Schedule an appointment as soon as possible for a visit.   Specialty: Nephrology Contact information: [address] [place] [address] [phone]                Discharge Exam: Danley Danker Weights   04/22/22 0730 04/22/22 1700 04/24/22 0339  Weight: 60.5 kg 61.6 kg 67 kg   Constitutional: Elderly male lying in bed, in no acute distress Eyes: PERRL, lids and conjunctivae normal ENMT: Mucous membranes are moist.  Neck: normal, supple, no masses, no thyromegaly Respiratory: clear to auscultation bilaterally,  Cardiovascular: Regular rate and rhythm,  Abdomen: Abdominal tenderness resolved no masses palpated. Peritoneal dialysis catheter in place Musculoskeletal: Good range of motion, no joint swelling or tenderness, Skin: no rashes, lesions, ulcers. No induration Neurologic: CN 2-12 grossly intact.   Condition at discharge: good  Discharge time spent: greater than 30 minutes.  Signed: Verline Lema, MD [facility] 04/24/2022

## 2022-04-24 NOTE — Progress Notes (Signed)
PHARMACY NOTE:  ANTIMICROBIAL RENAL DOSAGE ADJUSTMENT  Current antimicrobial regimen includes a mismatch between antimicrobial dosage and estimated renal function.  As per policy approved by the Pharmacy & Therapeutics and Medical Executive Committees, the antimicrobial dosage will be adjusted accordingly.  Current antimicrobial dosage:  Augmentin 750mg  PO every 12 hours  Indication:  Diverticulitis and peritonitis   Renal Function:  Estimated Creatinine Clearance: 14.9 mL/min (A) (by C-G formula based on SCr of 4.32 mg/dL (H)). [x]      On intermittent PD    Antimicrobial dosage has been changed to:  Augmentin 500mg  every 12 hours  Additional comments:   Thank you for allowing pharmacy to be a part of this patient's care.  Pernell Dupre, PharmD, BCPS Clinical Pharmacist 04/24/2022 12:44 PM

## 2022-04-24 NOTE — Evaluation (Signed)
Occupational Therapy Evaluation Patient Details Name: Casey French MRN: [id number] DOB: [birth year] Today's Date: 04/24/2022   History of Present Illness Pt is a 72 y.o. male presenting to hospital 04/18/22 with c/o 5 days of generalized weakness, fatigue, nausea, abdominal pain, chest pain, HA, and SOB; also some near syncope.  On peritoneal dialysis.  Pt admitted with acute uncomplicated sigmoid diverticulitis, gram-positive bacteremia, hypokalemia, and sycopal episode.  PMH includes CAD, DM, h/o GI bleed, htn, HLD, gout, depression, persistent a-fib on chronic anticoagulation, CKD, STEMI, L THA (anterior approach).   Clinical Impression   Patient agreeable to OT evaluation. Pt presenting with decreased independence in self care, balance, functional mobility/transfers, and endurance. PTA pt was independent with ADLs, IADLs, and functional mobility without an AD. Pt lives with spouse. Pt currently functioning at Mod I for bed mobility, CGA-supervision for functional mobility to the bathroom using a RW, supervision for toilet transfer, and set up-supervision for sinkside grooming tasks. Pt denied dizziness t/o session. Pt will benefit from skilled acute OT services to address deficits noted below. OT recommends ongoing therapy upon discharge to maximize safety and independence with ADLs, decrease fall risk, decrease caregiver burden, and promote return to PLOF.     Recommendations for follow up therapy are one component of a multi-disciplinary discharge planning process, led by the attending physician.  Recommendations may be updated based on patient status, additional functional criteria and insurance authorization.   Assistance Recommended at Discharge Intermittent Supervision/Assistance  Patient can return home with the following A little help with walking and/or transfers;A little help with bathing/dressing/bathroom;Assistance with cooking/housework;Assist for transportation;Help with stairs  or ramp for entrance    Functional Status Assessment  Patient has had a recent decline in their functional status and demonstrates the ability to make significant improvements in function in a reasonable and predictable amount of time.  Equipment Recommendations  None recommended by OT    Recommendations for Other Services       Precautions / Restrictions Precautions Precautions: Fall Precaution Comments: Peritoneal dialysis catheter L lower abdomen Restrictions Weight Bearing Restrictions: No      Mobility Bed Mobility Overal bed mobility: Modified Independent        Transfers Overall transfer level: Needs assistance Equipment used: Rolling walker (2 wheels), None Transfers: Sit to/from Stand Sit to Stand: Supervision        Balance Overall balance assessment: Needs assistance Sitting-balance support: No upper extremity supported, Feet supported Sitting balance-Leahy Scale: Good     Standing balance support: Single extremity supported, Bilateral upper extremity supported, During functional activity Standing balance-Leahy Scale: Fair           ADL either performed or assessed with clinical judgement   ADL Overall ADL's : Needs assistance/impaired     Grooming: Set up;Supervision/safety;Standing;Wash/dry hands       Toilet Transfer: Regular Toilet;Grab bars;Rolling walker (2 wheels);Supervision/safety           Functional mobility during ADLs: Min guard;Rolling walker (2 wheels);Supervision/safety (~74ft to the bathroom)       Vision Patient Visual Report: No change from baseline       Perception     Praxis      Pertinent Vitals/Pain Pain Assessment Pain Assessment: No/denies pain     Hand Dominance Right   Extremity/Trunk Assessment Upper Extremity Assessment Upper Extremity Assessment: Generalized weakness   Lower Extremity Assessment Lower Extremity Assessment: Generalized weakness   Cervical / Trunk Assessment Cervical /  Trunk Assessment: Normal   Communication Communication Communication:  No difficulties   Cognition Arousal/Alertness: Awake/alert Behavior During Therapy: WFL for tasks assessed/performed Overall Cognitive Status: Within Functional Limits for tasks assessed       General Comments  Pt stating "My dizziness is much better, I think the medicine helped." No s/s during session, tolerated well.    Exercises Other Exercises Other Exercises: OT provided education re: role of OT, OT POC, post acute recs, sitting up for all meals, EOB/OOB mobility with assistance, home/fall safety.     Shoulder Instructions      Home Living Family/patient expects to be discharged to:: Private residence Living Arrangements: Spouse/significant other Available Help at Discharge: Family;Available PRN/intermittently (wife works) Type of Home: House Home Access: Stairs to enter Technical brewer of Steps: 4 Entrance Stairs-Rails: Right;Left;Can reach both [place]: One level     Bathroom Shower/Tub: Teacher, early years/pre: [place]: Kasandra Knudsen - single Barista (2 wheels);BSC/3in1          Prior Functioning/Environment Prior Level of Function : Independent/Modified Independent;Driving             Mobility Comments: Independent with ambulation; no recent falls reported. ADLs Comments: Independent for ADLs, shared responsibility with wife for IADLs, still driving, IND for med mgmt using pill box        OT Problem List: Decreased strength;Decreased activity tolerance;Impaired balance (sitting and/or standing);Decreased knowledge of use of DME or AE;Decreased knowledge of precautions      OT Treatment/Interventions: Self-care/ADL training;Neuromuscular education;Energy conservation;DME and/or AE instruction;Manual therapy;Modalities;Balance training;Patient/family education;Visual/perceptual remediation/compensation;Cognitive  remediation/compensation;Therapeutic exercise;Therapeutic activities;Splinting    OT Goals(Current goals can be found in the care plan section) Acute Rehab OT Goals Patient Stated Goal: return home OT Goal Formulation: With patient Time For Goal Achievement: 05/08/22 Potential to Achieve Goals: Good   OT Frequency: Min 2X/week    Co-evaluation              AM-PAC OT "6 Clicks" Daily Activity     Outcome Measure Help from another person eating meals?: None Help from another person taking care of personal grooming?: None Help from another person toileting, which includes using toliet, bedpan, or urinal?: A Little Help from another person bathing (including washing, rinsing, drying)?: A Little Help from another person to put on and taking off regular upper body clothing?: None Help from another person to put on and taking off regular lower body clothing?: A Little 6 Click Score: 21   End of Session Equipment Utilized During Treatment: Gait belt;Rolling walker (2 wheels) Nurse Communication: Mobility status  Activity Tolerance: Patient tolerated treatment well Patient left: in chair;with call bell/phone within reach;with chair alarm set  OT Visit Diagnosis: Unsteadiness on feet (R26.81);Muscle weakness (generalized) (M62.81)                Time: [id number] OT Time Calculation (min): 12 min Charges:  OT General Charges $OT Visit: 1 Visit OT Evaluation $OT Eval Low Complexity: 1 Low  [facility] MS, OTR/L ascom [phone]  04/24/22, 1:34 PM

## 2022-04-24 NOTE — Progress Notes (Signed)
  Peritoneal Dialysis Treatment Disconnect Note     Post Treatment Weight: 60.9kg   Consent signed and in chart.  PD treatment disconnected via aseptic technique.    Patient is awake and alert. No complaints of pain.    PD exit site clean, dry and intact.     Hand-off given to the patient's nurse.       Darrol Jump LPN [facility]

## 2022-04-24 NOTE — Progress Notes (Signed)
[facility]  ROUNDING NOTE   Subjective:   Casey French admitted to [facility] on 04/18/2022 for Diverticulitis [K57.92] Acute diverticulitis [K57.92]  Patient is known to our practice and is followed by [facility], Dr. Juleen China for peritoneal dialysis.    Patient states his dizziness is much improved.   Continues to complain of lower back pain.   Peritoneal dialysis last night. Tolerated treatment well.   Objective:  Vital signs in last 24 hours:  Temp:  [97.9 F (36.6 C)-99.1 F (37.3 C)] 98.4 F (36.9 C) (03/30 0836) Pulse Rate:  [63-89] 73 (03/30 0836) Resp:  [16-22] 16 (03/30 0836) BP: (104-146)/(61-89) 146/85 (03/30 0836) SpO2:  [96 %-97 %] 96 % (03/30 0836) Weight:  [67 kg] 67 kg (03/30 0339)  Weight change: 6.5 kg Filed Weights   04/22/22 0730 04/22/22 1700 04/24/22 0339  Weight: 60.5 kg 61.6 kg 67 kg    Intake/Output: I/O last 3 completed shifts: In: 1381.9 [P.O.:960; I.V.:14.9; IV Piggyback:407] Out: 2203 [Urine:2100; Other:103]   Intake/Output this shift:  Total I/O In: -  Out: 431 [Other:431]  Physical Exam: General: NAD, laying in bed  Head: Normocephalic, atraumatic. moist oral mucosal membranes  Eyes: Anicteric  Lungs:  Clear to auscultation, normal effort  Heart: Regular rate and rhythm  Abdomen:  Soft, nontender. [place]  Extremities:  No peripheral edema.  Neurologic: Alert and oriented, moving all four extremities  Skin: No lesions  Access: [facility]    Basic Metabolic Panel: Recent Labs  Lab 04/18/22 1825 04/19/22 0506 04/20/22 0600 04/21/22 0604 04/22/22 0636 04/23/22 0518 04/24/22 0336  NA  --    < > 137 140 140 137 139  K  --    < > 2.9* 3.5 3.1* 3.2* 3.6  CL  --    < > 107 102 107 104 105  CO2  --    < > 23 25 24 24 23   GLUCOSE  --    < > 94 95 89 97 106*  BUN  --    < > 35* 25* 21 18 18   CREATININE  --    < > 4.80* 4.39* 4.32* 4.29* 4.32*  CALCIUM  --    < > 7.5* 7.9* 7.8* 8.0* 8.1*  MG 1.9  --   --   --   --    --   --    < > = values in this interval not displayed.     Liver Function Tests: Recent Labs  Lab 04/18/22 1809 04/19/22 0506  AST 26 18  ALT 14 12  ALKPHOS 124 103  BILITOT 1.1 0.4  PROT 6.8 5.9*  ALBUMIN 3.0* 2.6*    Recent Labs  Lab 04/18/22 1809  LIPASE 63*    No results for input(s): "AMMONIA" in the last 168 hours.  CBC: Recent Labs  Lab 04/20/22 0600 04/21/22 0604 04/22/22 0636 04/23/22 0518 04/24/22 0336  WBC 9.6 10.1 7.3 7.8 7.9  NEUTROABS 7.3 8.1* 5.4 6.0 5.8  HGB 9.1* 9.4* 9.1* 9.6* 9.4*  HCT 27.7* 28.3* 27.4* 29.2* 29.2*  MCV 96.5 95.9 96.1 95.4 98.0  PLT 211 213 222 254 265     Cardiac Enzymes: No results for input(s): "CKTOTAL", "CKMB", "CKMBINDEX", "TROPONINI" in the last 168 hours.  BNP: Invalid input(s): "POCBNP"  CBG: Recent Labs  Lab 04/23/22 0816 04/23/22 1147 04/23/22 1735 04/23/22 2201 04/24/22 0833  GLUCAP 84 115* 85 141* 72     Microbiology: Results for orders placed or performed during the  hospital encounter of 04/18/22  Resp panel by RT-PCR (RSV, Flu A&B, Covid) Anterior Nasal Swab     Status: None   Collection Time: 04/18/22  6:14 PM   Specimen: Anterior Nasal Swab  Result Value Ref Range Status   SARS Coronavirus 2 by RT PCR NEGATIVE NEGATIVE Final    Comment: (NOTE) SARS-CoV-2 target nucleic acids are NOT DETECTED.  The SARS-CoV-2 RNA is generally detectable in upper respiratory specimens during the acute phase of infection. The lowest concentration of SARS-CoV-2 viral copies this assay can detect is 138 copies/mL. A negative result does not preclude SARS-Cov-2 infection and should not be used as the sole basis for treatment or other patient management decisions. A negative result may occur with  improper specimen collection/handling, submission of specimen other than nasopharyngeal swab, presence of viral mutation(s) within the areas targeted by this assay, and inadequate number of viral copies(<138  copies/mL). A negative result must be combined with clinical observations, patient history, and epidemiological information. The expected result is Negative.  Fact Sheet for Patients:  EntrepreneurPulse.com.au  Fact Sheet for Healthcare Providers:  IncredibleEmployment.be  This test is no t yet approved or cleared by the [place] FDA and  has been authorized for detection and/or diagnosis of SARS-CoV-2 by FDA under an Emergency Use Authorization (EUA). This EUA will remain  in effect (meaning this test can be used) for the duration of the COVID-19 declaration under Section 564(b)(1) of the Act, 21 U.S.C.section 360bbb-3(b)(1), unless the authorization is terminated  or revoked sooner.       Influenza A by PCR NEGATIVE NEGATIVE Final   Influenza B by PCR NEGATIVE NEGATIVE Final    Comment: (NOTE) The Xpert Xpress SARS-CoV-2/FLU/RSV plus assay is intended as an aid in the diagnosis of influenza from Nasopharyngeal swab specimens and should not be used as a sole basis for treatment. Nasal washings and aspirates are unacceptable for Xpert Xpress SARS-CoV-2/FLU/RSV testing.  Fact Sheet for Patients: EntrepreneurPulse.com.au  Fact Sheet for Healthcare Providers: IncredibleEmployment.be  This test is not yet approved or cleared by the [place] FDA and has been authorized for detection and/or diagnosis of SARS-CoV-2 by FDA under an Emergency Use Authorization (EUA). This EUA will remain in effect (meaning this test can be used) for the duration of the COVID-19 declaration under Section 564(b)(1) of the Act, 21 U.S.C. section 360bbb-3(b)(1), unless the authorization is terminated or revoked.     Resp Syncytial Virus by PCR NEGATIVE NEGATIVE Final    Comment: (NOTE) Fact Sheet for Patients: EntrepreneurPulse.com.au  Fact Sheet for Healthcare  Providers: IncredibleEmployment.be  This test is not yet approved or cleared by the [place] FDA and has been authorized for detection and/or diagnosis of SARS-CoV-2 by FDA under an Emergency Use Authorization (EUA). This EUA will remain in effect (meaning this test can be used) for the duration of the COVID-19 declaration under Section 564(b)(1) of the Act, 21 U.S.C. section 360bbb-3(b)(1), unless the authorization is terminated or revoked.  Performed at [facility], [address] [place], [place] [address]   Blood Culture (routine x 2)     Status: Abnormal   Collection Time: 04/18/22  6:40 PM   Specimen: BLOOD  Result Value Ref Range Status   Specimen Description   Final    BLOOD BLOOD RIGHT HAND Performed at [facility], [address], [place], [place] [address]    Special Requests   Final    BOTTLES DRAWN AEROBIC AND ANAEROBIC Blood Culture adequate volume  Performed at [facility], [address] [place], [place] [address]    Culture  Setup Time   Final    GRAM POSITIVE COCCI IN BOTH AEROBIC AND ANAEROBIC BOTTLES CRITICAL RESULT CALLED TO, READ BACK BY AND VERIFIED WITH: C/DEVON MITCHELL 04/19/22 0924 SLM    Culture (A)  Final    STAPHYLOCOCCUS HOMINIS Two isolates with different morphologies were identified as the same organism.The most resistant organism was reported. Performed at [facility], [place] [address] [place], [place] [address]    Report Status 04/22/2022 FINAL  Final   Organism ID, Bacteria STAPHYLOCOCCUS HOMINIS  Final      Susceptibility   Staphylococcus hominis - MIC*    CIPROFLOXACIN 2 INTERMEDIATE Intermediate     ERYTHROMYCIN >=8 RESISTANT Resistant     GENTAMICIN <=0.5 SENSITIVE Sensitive     OXACILLIN <=0.25 SENSITIVE Sensitive     TETRACYCLINE >=16 RESISTANT Resistant     VANCOMYCIN <=0.5 SENSITIVE Sensitive     TRIMETH/SULFA <=10 SENSITIVE Sensitive     CLINDAMYCIN <=0.25  SENSITIVE Sensitive     RIFAMPIN <=0.5 SENSITIVE Sensitive     Inducible Clindamycin NEGATIVE Sensitive     * STAPHYLOCOCCUS HOMINIS  Blood Culture (routine x 2)     Status: Abnormal   Collection Time: 04/18/22  6:40 PM   Specimen: BLOOD  Result Value Ref Range Status   Specimen Description   Final    BLOOD BLOOD LEFT ARM Performed at [facility], [address] [place], [place] [address]    Special Requests   Final    BOTTLES DRAWN AEROBIC AND ANAEROBIC Blood Culture adequate volume Performed at [facility], [address] [place], [place] [address]    Culture  Setup Time   Final    GRAM POSITIVE COCCI AEROBIC BOTTLE ONLY CRITICAL VALUE NOTED.  VALUE IS CONSISTENT WITH PREVIOUSLY REPORTED AND CALLED VALUE. Performed at [facility], [address] [place], [place] [address]    Culture STAPHYLOCOCCUS HOMINIS (A)  Final   Report Status 04/23/2022 FINAL  Final   Organism ID, Bacteria STAPHYLOCOCCUS HOMINIS  Final      Susceptibility   Staphylococcus hominis - MIC*    CIPROFLOXACIN <=0.5 SENSITIVE Sensitive     ERYTHROMYCIN >=8 RESISTANT Resistant     GENTAMICIN <=0.5 SENSITIVE Sensitive     OXACILLIN <=0.25 SENSITIVE Sensitive     TETRACYCLINE <=1 SENSITIVE Sensitive     VANCOMYCIN <=0.5 SENSITIVE Sensitive     TRIMETH/SULFA <=10 SENSITIVE Sensitive     CLINDAMYCIN <=0.25 SENSITIVE Sensitive     RIFAMPIN <=0.5 SENSITIVE Sensitive     Inducible Clindamycin NEGATIVE Sensitive     * STAPHYLOCOCCUS HOMINIS  Blood Culture ID Panel (Reflexed)     Status: Abnormal   Collection Time: 04/18/22  6:40 PM  Result Value Ref Range Status   Enterococcus faecalis NOT DETECTED NOT DETECTED Final   Enterococcus Faecium NOT DETECTED NOT DETECTED Final   Listeria monocytogenes NOT DETECTED NOT DETECTED Final   Staphylococcus species DETECTED (A) NOT DETECTED Final    Comment: CRITICAL RESULT CALLED TO, READ BACK BY AND VERIFIED WITH: C/DEVON MITCHELL 04/19/22  0924 SLM    Staphylococcus aureus (BCID) NOT DETECTED NOT DETECTED Final   Staphylococcus epidermidis NOT DETECTED NOT DETECTED Final   Staphylococcus lugdunensis NOT DETECTED NOT DETECTED Final   Streptococcus species NOT DETECTED NOT DETECTED Final   Streptococcus agalactiae NOT DETECTED NOT DETECTED Final   Streptococcus pneumoniae NOT DETECTED NOT DETECTED  Final   Streptococcus pyogenes NOT DETECTED NOT DETECTED Final   A.calcoaceticus-baumannii NOT DETECTED NOT DETECTED Final   Bacteroides fragilis NOT DETECTED NOT DETECTED Final   Enterobacterales NOT DETECTED NOT DETECTED Final   Enterobacter cloacae complex NOT DETECTED NOT DETECTED Final   Escherichia coli NOT DETECTED NOT DETECTED Final   Klebsiella aerogenes NOT DETECTED NOT DETECTED Final   Klebsiella oxytoca NOT DETECTED NOT DETECTED Final   Klebsiella pneumoniae NOT DETECTED NOT DETECTED Final   Proteus species NOT DETECTED NOT DETECTED Final   Salmonella species NOT DETECTED NOT DETECTED Final   Serratia marcescens NOT DETECTED NOT DETECTED Final   Haemophilus influenzae NOT DETECTED NOT DETECTED Final   Neisseria meningitidis NOT DETECTED NOT DETECTED Final   Pseudomonas aeruginosa NOT DETECTED NOT DETECTED Final   Stenotrophomonas maltophilia NOT DETECTED NOT DETECTED Final   Candida albicans NOT DETECTED NOT DETECTED Final   Candida auris NOT DETECTED NOT DETECTED Final   Candida glabrata NOT DETECTED NOT DETECTED Final   Candida krusei NOT DETECTED NOT DETECTED Final   Candida parapsilosis NOT DETECTED NOT DETECTED Final   Candida tropicalis NOT DETECTED NOT DETECTED Final   Cryptococcus neoformans/gattii NOT DETECTED NOT DETECTED Final    Comment: Performed at [facility], [address] [place], [place] [address]  Culture, blood (Routine X 2) w Reflex to ID Panel     Status: None   Collection Time: 04/19/22  3:56 PM   Specimen: BLOOD  Result Value Ref Range Status   Specimen Description BLOOD BLOOD  RIGHT ARM  Final   Special Requests   Final    BOTTLES DRAWN AEROBIC AND ANAEROBIC Blood Culture results may not be optimal due to an excessive volume of blood received in culture bottles   Culture   Final    NO GROWTH 5 DAYS Performed at [facility], [address] [place], [place] [address]    Report Status 04/24/2022 FINAL  Final  Culture, blood (Routine X 2) w Reflex to ID Panel     Status: None   Collection Time: 04/19/22  3:58 PM   Specimen: BLOOD  Result Value Ref Range Status   Specimen Description BLOOD BLOOD RIGHT WRIST  Final   Special Requests   Final    BOTTLES DRAWN AEROBIC AND ANAEROBIC Blood Culture results may not be optimal due to an inadequate volume of blood received in culture bottles   Culture   Final    NO GROWTH 5 DAYS Performed at [facility], [address], [place], [place] [address]    Report Status 04/24/2022 FINAL  Final  Body fluid culture w Gram Stain     Status: None   Collection Time: 04/19/22  6:54 PM   Specimen: Peritoneal Washings; Body Fluid  Result Value Ref Range Status   Specimen Description   Final    PERITONEAL Performed at [facility], [address] [place], [place] [address]    Special Requests   Final    PERITONEAL Performed at [facility], [address] [place], [place] [address]    Gram Stain   Final    RARE WBC PRESENT, PREDOMINANTLY MONONUCLEAR NO ORGANISMS SEEN CYTOSPIN SMEAR    Culture   Final    NO GROWTH 3 DAYS Performed at [facility], [place] [address], [place],  [address]    Report Status 04/23/2022 FINAL  Final    Coagulation Studies: No results for input(s): "LABPROT", "INR" in the last  72 hours.  Urinalysis: No results for input(s): "COLORURINE", "LABSPEC", "PHURINE", "GLUCOSEU", "HGBUR", "BILIRUBINUR", "KETONESUR", "PROTEINUR", "UROBILINOGEN", "NITRITE", "LEUKOCYTESUR" in the last 72 hours.  Invalid input(s): "APPERANCEUR"     Imaging: MR  THORACIC SPINE WO CONTRAST  Result Date: 04/22/2022 CLINICAL DATA:  Mid-back pain, infection suspected, no prior imaging; Low back pain, infection suspected, no prior imaging. EXAM: MRI THORACIC AND LUMBAR SPINE WITHOUT CONTRAST TECHNIQUE: Multiplanar and multiecho pulse sequences of the thoracic and lumbar spine were obtained without intravenous contrast. COMPARISON:  MRI thoracic spine 02/22/2016. CT chest/abdomen/pelvis 04/18/2022. FINDINGS: MRI THORACIC SPINE FINDINGS Alignment:  Normal. Vertebrae: No fracture, evidence of discitis, or bone lesion. Cord:  Normal cord signal and volume. Paraspinal and other soft tissues: Trace bilateral pleural effusions. Disc levels: No significant degenerative change. MRI LUMBAR SPINE FINDINGS Segmentation:  Standard. Alignment:  Normal. Vertebrae:  No fracture, evidence of discitis, or bone lesion. Conus medullaris and cauda equina: Conus extends to the T12-L1 level. Conus and cauda equina appear normal. Paraspinal and other soft tissues: Atrophic kidneys. Moderate fatty atrophy of the paraspinal muscles. Disc levels: No disc herniation or spinal canal stenosis. Mild bilateral facet arthropathy at L5-S1. IMPRESSION: 1. No evidence of discitis-osteomyelitis in the thoracic or lumbar spine. 2. No spinal canal or neural foraminal stenosis. 3. Trace bilateral pleural effusions. Electronically Signed   By: Emmit Alexanders M.D.   On: 04/22/2022 11:55   MR LUMBAR SPINE WO CONTRAST  Result Date: 04/22/2022 CLINICAL DATA:  Mid-back pain, infection suspected, no prior imaging; Low back pain, infection suspected, no prior imaging. EXAM: MRI THORACIC AND LUMBAR SPINE WITHOUT CONTRAST TECHNIQUE: Multiplanar and multiecho pulse sequences of the thoracic and lumbar spine were obtained without intravenous contrast. COMPARISON:  MRI thoracic spine 02/22/2016. CT chest/abdomen/pelvis 04/18/2022. FINDINGS: MRI THORACIC SPINE FINDINGS Alignment:  Normal. Vertebrae: No fracture, evidence of  discitis, or bone lesion. Cord:  Normal cord signal and volume. Paraspinal and other soft tissues: Trace bilateral pleural effusions. Disc levels: No significant degenerative change. MRI LUMBAR SPINE FINDINGS Segmentation:  Standard. Alignment:  Normal. Vertebrae:  No fracture, evidence of discitis, or bone lesion. Conus medullaris and cauda equina: Conus extends to the T12-L1 level. Conus and cauda equina appear normal. Paraspinal and other soft tissues: Atrophic kidneys. Moderate fatty atrophy of the paraspinal muscles. Disc levels: No disc herniation or spinal canal stenosis. Mild bilateral facet arthropathy at L5-S1. IMPRESSION: 1. No evidence of discitis-osteomyelitis in the thoracic or lumbar spine. 2. No spinal canal or neural foraminal stenosis. 3. Trace bilateral pleural effusions. Electronically Signed   By: Emmit Alexanders M.D.   On: 04/22/2022 11:55     Medications:    sodium chloride Stopped (04/24/22 0036)   cefTRIAXone (ROCEPHIN) 1,200 mg in dialysis solution 1.5% low-MG/low-CA 3,000 mL dialysis solution     dialysis solution 1.5% low-MG/low-CA     metronidazole 500 mg (04/24/22 1021)    apixaban  2.5 mg Oral BID   aspirin EC  81 mg Oral Daily   calcitRIOL  0.25 mcg Oral Daily   calcium carbonate  2 tablet Oral Daily   folic acid  1 mg Oral Daily   gentamicin cream  1 Application Topical Daily   insulin aspart  0-5 Units Subcutaneous QHS   insulin aspart  0-9 Units Subcutaneous TID WC   isosorbide mononitrate  60 mg Oral Daily   meclizine  25 mg Oral BID   metoprolol succinate  100 mg Oral Daily   pantoprazole  40 mg Oral Daily  psyllium  1 packet Oral BID   rosuvastatin  40 mg Oral Daily   sodium chloride, morphine injection, nitroGLYCERIN, ondansetron **OR** ondansetron (ZOFRAN) IV  Assessment/ Plan:  [name] is a 72 y.o.  male with past medical conditions including GERD, diabetes, CAD, hypertension, gout, hyperlipidemia, atrial fibrillation, and  end-stage renal disease on peritoneal dialysis.  Patient presents to the emergency department with complaints of nausea, vomiting, diarrhea, and abdominal pain.  Patient has been admitted for Diverticulitis [K57.92] Acute diverticulitis [K57.92]  [facility] 68 kg CCPD Fills: 2.5L/ 4 exchanges/ 1.5hr dwell. No last fill  End stage renal disease on peritoneal dialysis.   Tolerating peritoneal dialysis treatment well.    2. Anemia of chronic kidney disease Lab Results  Component Value Date   HGB 9.4 (L) 04/24/2022  Patient receives Mircera monthly at outpatient clinic.  3. Secondary Hyperparathyroidism: with outpatient labs: PTH 854, phosphorus 5.3, calcium 7.8 on 03/26/22.   Lab Results  Component Value Date   CALCIUM 8.1 (L) 04/24/2022   PHOS 4.5 03/08/2021    Patient receives calcitriol, cholecalciferol, and calcium carbonate outpatient.     4. Hypertension with chronic kidney disease. Home regimen includes losartan, amlodipine, isosorbide, and metoprolol. Amlodipine and losartan held  Blood pressure stable for this patient  5. Diverticulitis and peritonitis:  Continue ceftriaxone 1g IP daily for a total of 3 weeks. Appreciate ID input.    LOS: 6 Casey French 3/30/202410:36 AM

## 2022-06-26 ENCOUNTER — Encounter: Payer: Self-pay | Admitting: Oncology

## 2022-06-26 ENCOUNTER — Other Ambulatory Visit: Payer: Self-pay

## 2022-06-26 ENCOUNTER — Inpatient Hospital Stay
Admission: EM | Admit: 2022-06-26 | Discharge: 2022-06-28 | DRG: 640 | Disposition: A | Discharge status: Home / Self Care | Payer: Medicare PPO | Attending: Internal Medicine | Admitting: Internal Medicine

## 2022-06-26 ENCOUNTER — Emergency Department: Payer: Medicare PPO

## 2022-06-26 DIAGNOSIS — Z7901 Long term (current) use of anticoagulants: Secondary | ICD-10-CM | POA: Diagnosis not present

## 2022-06-26 DIAGNOSIS — Z7982 Long term (current) use of aspirin: Secondary | ICD-10-CM

## 2022-06-26 DIAGNOSIS — M109 Gout, unspecified: Secondary | ICD-10-CM | POA: Diagnosis present

## 2022-06-26 DIAGNOSIS — I6783 Posterior reversible encephalopathy syndrome: Secondary | ICD-10-CM | POA: Diagnosis present

## 2022-06-26 DIAGNOSIS — Z1152 Encounter for screening for COVID-19: Secondary | ICD-10-CM

## 2022-06-26 DIAGNOSIS — F4321 Adjustment disorder with depressed mood: Secondary | ICD-10-CM | POA: Diagnosis present

## 2022-06-26 DIAGNOSIS — Z79899 Other long term (current) drug therapy: Secondary | ICD-10-CM

## 2022-06-26 DIAGNOSIS — J810 Acute pulmonary edema: Secondary | ICD-10-CM | POA: Diagnosis present

## 2022-06-26 DIAGNOSIS — Z992 Dependence on renal dialysis: Secondary | ICD-10-CM | POA: Diagnosis not present

## 2022-06-26 DIAGNOSIS — Z955 Presence of coronary angioplasty implant and graft: Secondary | ICD-10-CM

## 2022-06-26 DIAGNOSIS — I252 Old myocardial infarction: Secondary | ICD-10-CM

## 2022-06-26 DIAGNOSIS — Z87891 Personal history of nicotine dependence: Secondary | ICD-10-CM

## 2022-06-26 DIAGNOSIS — I120 Hypertensive chronic kidney disease with stage 5 chronic kidney disease or end stage renal disease: Secondary | ICD-10-CM | POA: Diagnosis present

## 2022-06-26 DIAGNOSIS — Z885 Allergy status to narcotic agent status: Secondary | ICD-10-CM

## 2022-06-26 DIAGNOSIS — E7800 Pure hypercholesterolemia, unspecified: Secondary | ICD-10-CM | POA: Diagnosis present

## 2022-06-26 DIAGNOSIS — R4182 Altered mental status, unspecified: Secondary | ICD-10-CM | POA: Diagnosis not present

## 2022-06-26 DIAGNOSIS — R410 Disorientation, unspecified: Secondary | ICD-10-CM

## 2022-06-26 DIAGNOSIS — N2581 Secondary hyperparathyroidism of renal origin: Secondary | ICD-10-CM | POA: Diagnosis present

## 2022-06-26 DIAGNOSIS — Z8249 Family history of ischemic heart disease and other diseases of the circulatory system: Secondary | ICD-10-CM | POA: Diagnosis not present

## 2022-06-26 DIAGNOSIS — J9601 Acute respiratory failure with hypoxia: Secondary | ICD-10-CM | POA: Diagnosis present

## 2022-06-26 DIAGNOSIS — E1122 Type 2 diabetes mellitus with diabetic chronic kidney disease: Secondary | ICD-10-CM | POA: Diagnosis present

## 2022-06-26 DIAGNOSIS — N186 End stage renal disease: Secondary | ICD-10-CM

## 2022-06-26 DIAGNOSIS — E8779 Other fluid overload: Secondary | ICD-10-CM | POA: Diagnosis present

## 2022-06-26 DIAGNOSIS — F32A Depression, unspecified: Secondary | ICD-10-CM | POA: Diagnosis present

## 2022-06-26 DIAGNOSIS — E119 Type 2 diabetes mellitus without complications: Secondary | ICD-10-CM | POA: Diagnosis not present

## 2022-06-26 DIAGNOSIS — I2510 Atherosclerotic heart disease of native coronary artery without angina pectoris: Secondary | ICD-10-CM | POA: Diagnosis present

## 2022-06-26 DIAGNOSIS — J189 Pneumonia, unspecified organism: Secondary | ICD-10-CM

## 2022-06-26 DIAGNOSIS — I160 Hypertensive urgency: Secondary | ICD-10-CM

## 2022-06-26 DIAGNOSIS — D631 Anemia in chronic kidney disease: Secondary | ICD-10-CM | POA: Diagnosis present

## 2022-06-26 DIAGNOSIS — Z91158 Patient's noncompliance with renal dialysis for other reason: Secondary | ICD-10-CM

## 2022-06-26 DIAGNOSIS — I480 Paroxysmal atrial fibrillation: Secondary | ICD-10-CM | POA: Diagnosis present

## 2022-06-26 DIAGNOSIS — G9341 Metabolic encephalopathy: Secondary | ICD-10-CM

## 2022-06-26 DIAGNOSIS — Z91128 Patient's intentional underdosing of medication regimen for other reason: Secondary | ICD-10-CM | POA: Diagnosis not present

## 2022-06-26 DIAGNOSIS — K219 Gastro-esophageal reflux disease without esophagitis: Secondary | ICD-10-CM | POA: Diagnosis present

## 2022-06-26 DIAGNOSIS — Z96642 Presence of left artificial hip joint: Secondary | ICD-10-CM | POA: Diagnosis present

## 2022-06-26 HISTORY — DX: Pneumonia, unspecified organism: J18.9

## 2022-06-26 LAB — COMPREHENSIVE METABOLIC PANEL
ALT: 25 U/L (ref 0–44)
AST: 47 U/L — ABNORMAL HIGH (ref 15–41)
Albumin: 2.9 g/dL — ABNORMAL LOW (ref 3.5–5.0)
Alkaline Phosphatase: 109 U/L (ref 38–126)
Anion gap: 11 (ref 5–15)
BUN: 26 mg/dL — ABNORMAL HIGH (ref 8–23)
CO2: 20 mmol/L — ABNORMAL LOW (ref 22–32)
Calcium: 8.1 mg/dL — ABNORMAL LOW (ref 8.9–10.3)
Chloride: 110 mmol/L (ref 98–111)
Creatinine, Ser: 5.34 mg/dL — ABNORMAL HIGH (ref 0.61–1.24)
GFR, Estimated: 11 mL/min — ABNORMAL LOW (ref >60)
Glucose, Bld: 213 mg/dL — ABNORMAL HIGH (ref 70–99)
Potassium: 4.1 mmol/L (ref 3.5–5.1)
Sodium: 141 mmol/L (ref 135–145)
Total Bilirubin: 0.9 mg/dL (ref 0.3–1.2)
Total Protein: 6.2 g/dL — ABNORMAL LOW (ref 6.5–8.1)

## 2022-06-26 LAB — URINALYSIS, COMPLETE (UACMP) WITH MICROSCOPIC
Bacteria, UA: NONE SEEN
Bilirubin Urine: NEGATIVE
Glucose, UA: 150 mg/dL — AB
Ketones, ur: 5 mg/dL — AB
Leukocytes,Ua: NEGATIVE
Nitrite: NEGATIVE
Protein, ur: >=300 mg/dL — AB
Specific Gravity, Urine: 1.007 (ref 1.005–1.030)
Squamous Epithelial / HPF: NONE SEEN /HPF (ref 0–5)
pH: 8 (ref 5.0–8.0)

## 2022-06-26 LAB — CBC
HCT: 34.1 % — ABNORMAL LOW (ref 39.0–52.0)
Hemoglobin: 10.6 g/dL — ABNORMAL LOW (ref 13.0–17.0)
MCH: 31.3 pg (ref 26.0–34.0)
MCHC: 31.1 g/dL (ref 30.0–36.0)
MCV: 100.6 fL — ABNORMAL HIGH (ref 80.0–100.0)
Platelets: 143 10*3/uL — ABNORMAL LOW (ref 150–400)
RBC: 3.39 MIL/uL — ABNORMAL LOW (ref 4.22–5.81)
RDW: 14.9 % (ref 11.5–15.5)
WBC: 10.6 10*3/uL — ABNORMAL HIGH (ref 4.0–10.5)
nRBC: 0 % (ref 0.0–0.2)

## 2022-06-26 LAB — MRSA NEXT GEN BY PCR, NASAL: MRSA by PCR Next Gen: NOT DETECTED

## 2022-06-26 LAB — SARS CORONAVIRUS 2 BY RT PCR: SARS Coronavirus 2 by RT PCR: NEGATIVE

## 2022-06-26 LAB — PROCALCITONIN: Procalcitonin: 0.19 ng/mL

## 2022-06-26 LAB — AMMONIA: Ammonia: 14 umol/L (ref 9–35)

## 2022-06-26 LAB — GLUCOSE, CAPILLARY: Glucose-Capillary: 116 mg/dL — ABNORMAL HIGH (ref 70–99)

## 2022-06-26 MED ORDER — HALOPERIDOL LACTATE 5 MG/ML IJ SOLN: 5.0000 mg | Freq: Once | INTRAMUSCULAR | Status: AC

## 2022-06-26 MED ORDER — ACETAMINOPHEN 650 MG RE SUPP: 650.0000 mg | Freq: Four times a day (QID) | RECTAL | Status: DC | PRN

## 2022-06-26 MED ORDER — SODIUM CHLORIDE 0.9 % IV SOLN
2.0000 g | INTRAVENOUS | Status: DC
  Administered 2022-06-26 – 2022-06-27 (×2): 2 g via INTRAVENOUS
  Filled 2022-06-26 (×2): qty 20

## 2022-06-26 MED ORDER — ONDANSETRON HCL 4 MG/2ML IJ SOLN
4.0000 mg | Freq: Four times a day (QID) | INTRAMUSCULAR | Status: DC | PRN
  Administered 2022-06-26: 4 mg via INTRAVENOUS
  Filled 2022-06-26: qty 2

## 2022-06-26 MED ORDER — ALBUTEROL SULFATE (2.5 MG/3ML) 0.083% IN NEBU: 2.5000 mg | INHALATION_SOLUTION | RESPIRATORY_TRACT | Status: DC | PRN

## 2022-06-26 MED ORDER — GENTAMICIN SULFATE 0.1 % EX CREA
1.0000 | TOPICAL_CREAM | Freq: Every day | CUTANEOUS | Status: DC
  Administered 2022-06-26 – 2022-06-28 (×3): 1 via TOPICAL
  Filled 2022-06-26 (×2): qty 15

## 2022-06-26 MED ORDER — LABETALOL HCL 5 MG/ML IV SOLN
10.0000 mg | Freq: Once | INTRAVENOUS | Status: AC
  Administered 2022-06-26: 10 mg via INTRAVENOUS
  Filled 2022-06-26: qty 4

## 2022-06-26 MED ORDER — HYDRALAZINE HCL 20 MG/ML IJ SOLN
10.0000 mg | INTRAMUSCULAR | Status: DC | PRN
  Filled 2022-06-26: qty 1

## 2022-06-26 MED ORDER — LORAZEPAM 2 MG/ML IJ SOLN: 2.0000 mg | Freq: Once | INTRAMUSCULAR | Status: AC

## 2022-06-26 MED ORDER — LORAZEPAM 2 MG/ML IJ SOLN: 0.5000 mg | Freq: Four times a day (QID) | INTRAMUSCULAR | Status: DC | PRN

## 2022-06-26 MED ORDER — FUROSEMIDE 10 MG/ML IJ SOLN
60.0000 mg | Freq: Once | INTRAMUSCULAR | Status: AC
  Administered 2022-06-26: 60 mg via INTRAVENOUS
  Filled 2022-06-26: qty 8

## 2022-06-26 MED ORDER — ONDANSETRON HCL 4 MG PO TABS: 4.0000 mg | ORAL_TABLET | Freq: Four times a day (QID) | ORAL | Status: DC | PRN

## 2022-06-26 MED ORDER — NICARDIPINE HCL IN NACL 20-0.86 MG/200ML-% IV SOLN
3.0000 mg/h | INTRAVENOUS | Status: DC
  Administered 2022-06-26: 5 mg/h via INTRAVENOUS
  Administered 2022-06-26: 4 mg/h via INTRAVENOUS
  Administered 2022-06-27: 3 mg/h via INTRAVENOUS
  Filled 2022-06-26 (×3): qty 200

## 2022-06-26 MED ORDER — APIXABAN 2.5 MG PO TABS
2.5000 mg | ORAL_TABLET | Freq: Two times a day (BID) | ORAL | Status: DC
  Administered 2022-06-26 – 2022-06-28 (×4): 2.5 mg via ORAL
  Filled 2022-06-26 (×5): qty 1

## 2022-06-26 MED ORDER — SODIUM CHLORIDE 0.9 % IV SOLN
500.0000 mg | INTRAVENOUS | Status: DC
  Administered 2022-06-26 – 2022-06-27 (×2): 500 mg via INTRAVENOUS
  Filled 2022-06-26 (×2): qty 5

## 2022-06-26 MED ORDER — ACETAMINOPHEN 325 MG PO TABS
650.0000 mg | ORAL_TABLET | Freq: Four times a day (QID) | ORAL | Status: DC | PRN
  Administered 2022-06-27 (×3): 650 mg via ORAL
  Filled 2022-06-26 (×3): qty 2

## 2022-06-26 MED ORDER — METOPROLOL TARTRATE 25 MG PO TABS
25.0000 mg | ORAL_TABLET | Freq: Two times a day (BID) | ORAL | Status: DC
  Administered 2022-06-26 – 2022-06-27 (×2): 25 mg via ORAL
  Filled 2022-06-26 (×2): qty 1

## 2022-06-26 MED ORDER — HALOPERIDOL LACTATE 5 MG/ML IJ SOLN
INTRAMUSCULAR | Status: AC
  Administered 2022-06-26: 5 mg via INTRAMUSCULAR
  Filled 2022-06-26: qty 1

## 2022-06-26 MED ORDER — LOSARTAN POTASSIUM 50 MG PO TABS
100.0000 mg | ORAL_TABLET | Freq: Every day | ORAL | Status: DC
  Administered 2022-06-27 – 2022-06-28 (×2): 100 mg via ORAL
  Filled 2022-06-26 (×3): qty 2

## 2022-06-26 MED ORDER — CHLORHEXIDINE GLUCONATE CLOTH 2 % EX PADS
6.0000 | MEDICATED_PAD | Freq: Every day | CUTANEOUS | Status: DC
  Administered 2022-06-26 – 2022-06-28 (×3): 6 via TOPICAL

## 2022-06-26 MED ORDER — CLONIDINE HCL 0.2 MG/24HR TD PTWK
0.2000 mg | MEDICATED_PATCH | TRANSDERMAL | Status: DC
  Filled 2022-06-26: qty 1

## 2022-06-26 MED ORDER — ASPIRIN 81 MG PO TBEC: 81.0000 mg | DELAYED_RELEASE_TABLET | Freq: Every day | ORAL | Status: DC

## 2022-06-26 MED ORDER — AMLODIPINE BESYLATE 10 MG PO TABS
10.0000 mg | ORAL_TABLET | Freq: Every day | ORAL | Status: DC
  Administered 2022-06-27 – 2022-06-28 (×2): 10 mg via ORAL
  Filled 2022-06-26 (×2): qty 1

## 2022-06-26 MED ORDER — LABETALOL HCL 5 MG/ML IV SOLN
20.0000 mg | Freq: Once | INTRAVENOUS | Status: AC
  Administered 2022-06-26: 20 mg via INTRAVENOUS
  Filled 2022-06-26: qty 4

## 2022-06-26 MED ORDER — DELFLEX-LC/1.5% DEXTROSE 344 MOSM/L IP SOLN
INTRAPERITONEAL | Status: DC
  Filled 2022-06-26 (×3): qty 3000

## 2022-06-26 MED ORDER — HYDRALAZINE HCL 20 MG/ML IJ SOLN: 5.0000 mg | INTRAMUSCULAR | Status: DC | PRN

## 2022-06-26 MED ORDER — LORAZEPAM 2 MG/ML IJ SOLN
INTRAMUSCULAR | Status: AC
  Administered 2022-06-26: 2 mg via INTRAMUSCULAR
  Filled 2022-06-26: qty 1

## 2022-06-26 MED ORDER — LORAZEPAM 2 MG/ML IJ SOLN
2.0000 mg | Freq: Once | INTRAMUSCULAR | Status: AC
  Administered 2022-06-26: 2 mg via INTRAVENOUS
  Filled 2022-06-26: qty 1

## 2022-06-26 NOTE — Assessment & Plan Note (Signed)
Etiology uncertain, hypertensive encephalopathy versus related to possible pneumonia Head CT was negative Will keep n.p.o. due to lethargy Fall and aspiration precautions

## 2022-06-26 NOTE — Progress Notes (Signed)
[facility]  ROUNDING NOTE   Subjective:   [name] was admitted to [facility] on 06/26/2022 for Acute metabolic encephalopathy [G93.41] Hypertensive urgency, malignant [I16.0]  Patient has missed four days of peritoneal dialysis.   Patient is not taking his medications.   Confused on admission, given lorazepam and placed on nicardipine gtt.   Daughter at bedside.   Objective:  Vital signs in last 24 hours:  Temp:  [97.7 F (36.5 C)-97.9 F (36.6 C)] 97.9 F (36.6 C) (06/01 1419) Pulse Rate:  [25-111] 106 (06/01 1430) Resp:  [18-38] 29 (06/01 1430) BP: (134-255)/(63-136) 164/88 (06/01 1430) SpO2:  [86 %-100 %] 96 % (06/01 1430) Weight:  [141.5 kg] 141.5 kg (06/01 0116)  Weight change:  Filed Weights   06/26/22 0116  Weight: (!) 141.5 kg    Intake/Output: No intake/output data recorded.   Intake/Output this shift:  No intake/output data recorded.  Physical Exam: General: NAD, laying on stretcher  Head: Normocephalic, atraumatic. Moist oral mucosal membranes  Eyes: Anicteric, PERRL  Neck: Supple, trachea midline  Lungs:  Clear to auscultation  Heart: Regular rate and rhythm  Abdomen:  Soft, nontender,   Extremities:  no peripheral edema.  Neurologic: Somnolent.   Skin: No lesions  Access: PD catheter    Basic Metabolic Panel: Recent Labs  Lab 06/26/22 0209  NA 141  K 4.1  CL 110  CO2 20*  GLUCOSE 213*  BUN 26*  CREATININE 5.34*  CALCIUM 8.1*    Liver Function Tests: Recent Labs  Lab 06/26/22 0209  AST 47*  ALT 25  ALKPHOS 109  BILITOT 0.9  PROT 6.2*  ALBUMIN 2.9*   No results for input(s): "LIPASE", "AMYLASE" in the last 168 hours. Recent Labs  Lab 06/26/22 0209  AMMONIA 14    CBC: Recent Labs  Lab 06/26/22 0209  WBC 10.6*  HGB 10.6*  HCT 34.1*  MCV 100.6*  PLT 143*    Cardiac Enzymes: No results for input(s): "CKTOTAL", "CKMB", "CKMBINDEX", "TROPONINI" in the last 168 hours.  BNP: Invalid  input(s): "POCBNP"  CBG: No results for input(s): "GLUCAP" in the last 168 hours.  Microbiology: Results for orders placed or performed during the hospital encounter of 04/18/22  Resp panel by RT-PCR (RSV, Flu A&B, Covid) Anterior Nasal Swab     Status: None   Collection Time: 04/18/22  6:14 PM   Specimen: Anterior Nasal Swab  Result Value Ref Range Status   SARS Coronavirus 2 by RT PCR NEGATIVE NEGATIVE Final    Comment: (NOTE) SARS-CoV-2 target nucleic acids are NOT DETECTED.  The SARS-CoV-2 RNA is generally detectable in upper respiratory specimens during the acute phase of infection. The lowest concentration of SARS-CoV-2 viral copies this assay can detect is 138 copies/mL. A negative result does not preclude SARS-Cov-2 infection and should not be used as the sole basis for treatment or other patient management decisions. A negative result may occur with  improper specimen collection/handling, submission of specimen other than nasopharyngeal swab, presence of viral mutation(s) within the areas targeted by this assay, and inadequate number of viral copies(<138 copies/mL). A negative result must be combined with clinical observations, patient history, and epidemiological information. The expected result is Negative.  Fact Sheet for Patients:  BloggerCourse.com  Fact Sheet for Healthcare Providers:  SeriousBroker.it  This test is no t yet approved or cleared by the [place] FDA and  has been authorized for detection and/or diagnosis of SARS-CoV-2 by FDA under an Emergency Use Authorization (  EUA). This EUA will remain  in effect (meaning this test can be used) for the duration of the COVID-19 declaration under Section 564(b)(1) of the Act, 21 U.S.C.section 360bbb-3(b)(1), unless the authorization is terminated  or revoked sooner.       Influenza A by PCR NEGATIVE NEGATIVE Final   Influenza B by PCR NEGATIVE NEGATIVE  Final    Comment: (NOTE) The Xpert Xpress SARS-CoV-2/FLU/RSV plus assay is intended as an aid in the diagnosis of influenza from Nasopharyngeal swab specimens and should not be used as a sole basis for treatment. Nasal washings and aspirates are unacceptable for Xpert Xpress SARS-CoV-2/FLU/RSV testing.  Fact Sheet for Patients: BloggerCourse.com  Fact Sheet for Healthcare Providers: SeriousBroker.it  This test is not yet approved or cleared by the [place] FDA and has been authorized for detection and/or diagnosis of SARS-CoV-2 by FDA under an Emergency Use Authorization (EUA). This EUA will remain in effect (meaning this test can be used) for the duration of the COVID-19 declaration under Section 564(b)(1) of the Act, 21 U.S.C. section 360bbb-3(b)(1), unless the authorization is terminated or revoked.     Resp Syncytial Virus by PCR NEGATIVE NEGATIVE Final    Comment: (NOTE) Fact Sheet for Patients: BloggerCourse.com  Fact Sheet for Healthcare Providers: SeriousBroker.it  This test is not yet approved or cleared by the [place] FDA and has been authorized for detection and/or diagnosis of SARS-CoV-2 by FDA under an Emergency Use Authorization (EUA). This EUA will remain in effect (meaning this test can be used) for the duration of the COVID-19 declaration under Section 564(b)(1) of the Act, 21 U.S.C. section 360bbb-3(b)(1), unless the authorization is terminated or revoked.  Performed at [facility], [address], [place], [place] [address]   Blood Culture (routine x 2)     Status: Abnormal   Collection Time: 04/18/22  6:40 PM   Specimen: BLOOD  Result Value Ref Range Status   Specimen Description   Final    BLOOD BLOOD RIGHT HAND Performed at [facility], [address], [place], [place] [address]    Special Requests   Final     BOTTLES DRAWN AEROBIC AND ANAEROBIC Blood Culture adequate volume Performed at [facility], [address] [place], [place] [address]    Culture  Setup Time   Final    GRAM POSITIVE COCCI IN BOTH AEROBIC AND ANAEROBIC BOTTLES CRITICAL RESULT CALLED TO, READ BACK BY AND VERIFIED WITH: C/DEVON MITCHELL 04/19/22 0924 SLM    Culture (A)  Final    STAPHYLOCOCCUS HOMINIS Two isolates with different morphologies were identified as the same organism.The most resistant organism was reported. Performed at [facility], [address], [place], [place] [address]    Report Status 04/22/2022 FINAL  Final   Organism ID, Bacteria STAPHYLOCOCCUS HOMINIS  Final      Susceptibility   Staphylococcus hominis - MIC*    CIPROFLOXACIN 2 INTERMEDIATE Intermediate     ERYTHROMYCIN >=8 RESISTANT Resistant     GENTAMICIN <=0.5 SENSITIVE Sensitive     OXACILLIN <=0.25 SENSITIVE Sensitive     TETRACYCLINE >=16 RESISTANT Resistant     VANCOMYCIN <=0.5 SENSITIVE Sensitive     TRIMETH/SULFA <=10 SENSITIVE Sensitive     CLINDAMYCIN <=0.25 SENSITIVE Sensitive     RIFAMPIN <=0.5 SENSITIVE Sensitive     Inducible Clindamycin NEGATIVE Sensitive     * STAPHYLOCOCCUS HOMINIS  Blood Culture (routine x 2)     Status: Abnormal   Collection Time:  04/18/22  6:40 PM   Specimen: BLOOD  Result Value Ref Range Status   Specimen Description   Final    BLOOD BLOOD LEFT ARM Performed at [facility], [address], [place], [place] [address]    Special Requests   Final    BOTTLES DRAWN AEROBIC AND ANAEROBIC Blood Culture adequate volume Performed at [facility], [address] [place], [place] [address]    Culture  Setup Time   Final    GRAM POSITIVE COCCI AEROBIC BOTTLE ONLY CRITICAL VALUE NOTED.  VALUE IS CONSISTENT WITH PREVIOUSLY REPORTED AND CALLED VALUE. Performed at [facility], [address] [place], [place] [address]    Culture STAPHYLOCOCCUS HOMINIS (A)   Final   Report Status 04/23/2022 FINAL  Final   Organism ID, Bacteria STAPHYLOCOCCUS HOMINIS  Final      Susceptibility   Staphylococcus hominis - MIC*    CIPROFLOXACIN <=0.5 SENSITIVE Sensitive     ERYTHROMYCIN >=8 RESISTANT Resistant     GENTAMICIN <=0.5 SENSITIVE Sensitive     OXACILLIN <=0.25 SENSITIVE Sensitive     TETRACYCLINE <=1 SENSITIVE Sensitive     VANCOMYCIN <=0.5 SENSITIVE Sensitive     TRIMETH/SULFA <=10 SENSITIVE Sensitive     CLINDAMYCIN <=0.25 SENSITIVE Sensitive     RIFAMPIN <=0.5 SENSITIVE Sensitive     Inducible Clindamycin NEGATIVE Sensitive     * STAPHYLOCOCCUS HOMINIS  Blood Culture ID Panel (Reflexed)     Status: Abnormal   Collection Time: 04/18/22  6:40 PM  Result Value Ref Range Status   Enterococcus faecalis NOT DETECTED NOT DETECTED Final   Enterococcus Faecium NOT DETECTED NOT DETECTED Final   Listeria monocytogenes NOT DETECTED NOT DETECTED Final   Staphylococcus species DETECTED (A) NOT DETECTED Final    Comment: CRITICAL RESULT CALLED TO, READ BACK BY AND VERIFIED WITH: C/DEVON MITCHELL 04/19/22 0924 SLM    Staphylococcus aureus (BCID) NOT DETECTED NOT DETECTED Final   Staphylococcus epidermidis NOT DETECTED NOT DETECTED Final   Staphylococcus lugdunensis NOT DETECTED NOT DETECTED Final   Streptococcus species NOT DETECTED NOT DETECTED Final   Streptococcus agalactiae NOT DETECTED NOT DETECTED Final   Streptococcus pneumoniae NOT DETECTED NOT DETECTED Final   Streptococcus pyogenes NOT DETECTED NOT DETECTED Final   A.calcoaceticus-baumannii NOT DETECTED NOT DETECTED Final   Bacteroides fragilis NOT DETECTED NOT DETECTED Final   Enterobacterales NOT DETECTED NOT DETECTED Final   Enterobacter cloacae complex NOT DETECTED NOT DETECTED Final   Escherichia coli NOT DETECTED NOT DETECTED Final   Klebsiella aerogenes NOT DETECTED NOT DETECTED Final   Klebsiella oxytoca NOT DETECTED NOT DETECTED Final   Klebsiella pneumoniae NOT DETECTED NOT DETECTED  Final   Proteus species NOT DETECTED NOT DETECTED Final   Salmonella species NOT DETECTED NOT DETECTED Final   Serratia marcescens NOT DETECTED NOT DETECTED Final   Haemophilus influenzae NOT DETECTED NOT DETECTED Final   Neisseria meningitidis NOT DETECTED NOT DETECTED Final   Pseudomonas aeruginosa NOT DETECTED NOT DETECTED Final   Stenotrophomonas maltophilia NOT DETECTED NOT DETECTED Final   Candida albicans NOT DETECTED NOT DETECTED Final   Candida auris NOT DETECTED NOT DETECTED Final   Candida glabrata NOT DETECTED NOT DETECTED Final   Candida krusei NOT DETECTED NOT DETECTED Final   Candida parapsilosis NOT DETECTED NOT DETECTED Final   Candida tropicalis NOT DETECTED NOT DETECTED Final   Cryptococcus neoformans/gattii NOT DETECTED NOT DETECTED Final    Comment: Performed at [facility], [address] [place], [place]  [address]  Culture, blood (Routine X 2) w Reflex to ID Panel     Status: None   Collection Time: 04/19/22  3:56 PM   Specimen: BLOOD  Result Value Ref Range Status   Specimen Description BLOOD BLOOD RIGHT ARM  Final   Special Requests   Final    BOTTLES DRAWN AEROBIC AND ANAEROBIC Blood Culture results may not be optimal due to an excessive volume of blood received in culture bottles   Culture   Final    NO GROWTH 5 DAYS Performed at [facility], [address] [place], [place] [address]    Report Status 04/24/2022 FINAL  Final  Culture, blood (Routine X 2) w Reflex to ID Panel     Status: None   Collection Time: 04/19/22  3:58 PM   Specimen: BLOOD  Result Value Ref Range Status   Specimen Description BLOOD BLOOD RIGHT WRIST  Final   Special Requests   Final    BOTTLES DRAWN AEROBIC AND ANAEROBIC Blood Culture results may not be optimal due to an inadequate volume of blood received in culture bottles   Culture   Final    NO GROWTH 5 DAYS Performed at [facility], [address], [place], [place] [address]    Report  Status 04/24/2022 FINAL  Final  Body fluid culture w Gram Stain     Status: None   Collection Time: 04/19/22  6:54 PM   Specimen: Peritoneal Washings; Body Fluid  Result Value Ref Range Status   Specimen Description   Final    PERITONEAL Performed at [facility], [address] [place], [place] [address]    Special Requests   Final    PERITONEAL Performed at [facility], [address] [place], [place] [address]    Gram Stain   Final    RARE WBC PRESENT, PREDOMINANTLY MONONUCLEAR NO ORGANISMS SEEN CYTOSPIN SMEAR    Culture   Final    NO GROWTH 3 DAYS Performed at [facility], [address] [place], [place] [address]    Report Status 04/23/2022 FINAL  Final    Coagulation Studies: No results for input(s): "LABPROT", "INR" in the last 72 hours.  Urinalysis: Recent Labs    06/26/22 0727  COLORURINE STRAW*  LABSPEC 1.007  PHURINE 8.0  GLUCOSEU 150*  HGBUR SMALL*  BILIRUBINUR NEGATIVE  KETONESUR 5*  PROTEINUR >=300*  NITRITE NEGATIVE  LEUKOCYTESUR NEGATIVE      Imaging: CT HEAD WO CONTRAST ( )  Result Date: 06/26/2022 CLINICAL DATA:  Head trauma, minor (Age >= 65y) Possible unwitnessed fall, found on the floor. EXAM: CT HEAD WITHOUT CONTRAST TECHNIQUE: Contiguous axial images were obtained from the base of the skull through the vertex without intravenous contrast. RADIATION DOSE REDUCTION: This exam was performed according to the departmental dose-optimization program which includes automated exposure control, adjustment of the mA and/or kV according to patient size and/or use of iterative reconstruction technique. COMPARISON:  04/18/2022 FINDINGS: Brain: No intracranial hemorrhage, mass effect, or midline shift. Normal for age atrophy. No hydrocephalus. The basilar cisterns are patent. Unchanged periventricular and deep white matter hypodensity typical of chronic small vessel ischemia. Remote right basal gangliar lacunar infarct. No  evidence of territorial infarct or acute ischemia. No extra-axial or intracranial fluid collection. Vascular: Atherosclerosis of skullbase vasculature without hyperdense vessel or abnormal calcification. Skull: No fracture or focal lesion. Sinuses/Orbits: Paranasal sinuses and mastoid air cells are clear. The visualized orbits are unremarkable. Other: None. IMPRESSION: 1.  No acute intracranial abnormality. No skull fracture. 2. Unchanged chronic small vessel ischemia and remote right basal gangliar lacunar infarct. Electronically Signed   By: Narda Rutherford M.D.   On: 06/26/2022 02:41   DG Chest Portable 1 View  Result Date: 06/26/2022 CLINICAL DATA:  Hypoxia. Patient was found on the floor. Unwitnessed fall. EXAM: PORTABLE CHEST 1 VIEW COMPARISON:  04/18/2022 FINDINGS: Shallow inspiration. Linear atelectasis in the right lung base. Heart size is normal for technique. Since the previous study, there is increased perihilar hazy opacities suggesting developing infiltration or edema. Possible small right pleural effusion. No pneumothorax. Mediastinal contours appear intact. Calcification of the aorta. IMPRESSION: 1. Developing hazy perihilar infiltrates possibly edema or pneumonia. 2. Linear atelectasis in the right lung base. 3. Possible small right pleural effusion. Electronically Signed   By: Burman Nieves M.D.   On: 06/26/2022 01:29     Medications:    azithromycin Stopped (06/26/22 0844)   cefTRIAXone (ROCEPHIN)  IV Stopped (06/26/22 0736)   niCARDipine 4 mg/hr (06/26/22 1417)    apixaban  2.5 mg Oral BID   acetaminophen **OR** acetaminophen, albuterol, hydrALAZINE, LORazepam, ondansetron **OR** ondansetron (ZOFRAN) IV  Assessment/ Plan:  [name] is a 72 y.o.  male with end stage renal disease on peritoneal dialysis, hypertension, GERD, coronary artery disease, gout, hyperlipidemia, atrial fibrillation, who is admitted to [facility] on 06/26/2022 for Acute metabolic encephalopathy  [G93.41] Hypertensive urgency, malignant [I16.0]  [facility] 62kg CCPD:  9 hours 4 exchanges fills  End Stage Renal Disease: restart peritoneal dialysis tonight.   Hypertension with chronic kidney disease: uncontrolled and concern for hypertensive encephalopathy. Placed on nicardipine gtt. Restart home medication of isosorbide mononitrate and  metoprolol. Previously on amlodipine and losartan as well.   Anemia of chronic kidney disease: hemoglobin 10.6  Secondary Hyperparathyroidism: has not been taking binder   LOS: 0 Janalyn Higby 6/1/20242:49 PM

## 2022-06-26 NOTE — Assessment & Plan Note (Addendum)
Anemia of CKD Nephrology consult Hemoglobin stable

## 2022-06-26 NOTE — ED Triage Notes (Signed)
Pt arrives via [facility] with c/o possible fall as pt was found on the floor by wife. She did not witness him falling, pt not answering questions upon arrival, mumbles occasionally. Wife is poor historian, EMS advises that pt is supposed to do peritoneal dialysis at home every night, unknown last time pt did same. EMS unable to obtain full set of vitals due to pt being combative. Pt responsive to painful stimuli, able to open eyes when asked but no other commands carried out.   230 bgl

## 2022-06-26 NOTE — ED Notes (Signed)
Pt cleaned of stool and urine by this RN and Marylene Land, Charity fundraiser. Sheets changed, new brief placed. MD at bedside

## 2022-06-26 NOTE — Assessment & Plan Note (Signed)
Sliding scale insulin coverage 

## 2022-06-26 NOTE — ED Notes (Signed)
Pt cleaned of urine, bed sheet changed, new brief placed, new chucks placed. Pt shirt taken off and placed in gown, condom cath hooked back to suction.

## 2022-06-26 NOTE — ED Triage Notes (Signed)
Pt reportedly A&O x4 at baseline. MD being notified at this time

## 2022-06-26 NOTE — H&P (Addendum)
History and Physical    Patient: Casey French BJY:[id number] DOB: 04/25/1950 DOA: 06/26/2022 DOS: the patient was seen and examined on 06/26/2022 PCP: Jerl Mina, MD  Patient coming from: Home  Chief Complaint:  Chief Complaint  Patient presents with   Altered Mental Status    HPI: Casey French is a 72 y.o. male with medical history significant for CAD, DM, HTN, A-fib on Eliquis, history of GI bleed and ESRD on PD who presents to the ED with altered mental status described as confusion and combativeness that developed over the past 3 to 4 days which is out of character for him.  Most of the history is given by the wife at the bedside who stated that he stopped doing the PD about 4 days ago.  Usually he is compliant and does it on his own. ED course and data review: BP 208/92 on arrival with O2 sat 86% on room air, pulse 93, afebrile.  Labs notable for WBC 10,600, Hemoglobin at baseline at 10.6, blood glucose 213 ammonia 14. EKG, personally viewed and interpreted showing sinus at 93 CT head nonacute showing chronic small vessel ischemia and remote right basal ganglia lacunar infarct. Chest x-ray showing developing hazy perihilar infiltrates possibly edema or pneumonia and a possible small right pleural effusion Patient was given Haldol due to combativeness in the ED and also given a dose of Lasix 60 mg. Hospitalist consulted for admission.     Past Medical History:  Diagnosis Date   Acute diverticulitis 06/14/2014   Anemia    Anginal pain (HCC)    Aortic atherosclerosis (HCC)    Atrial fibrillation (HCC)    CKD (chronic kidney disease) stage 3, GFR 30-59 ml/min (HCC)    Coronary artery disease    Depression    Diabetes mellitus without complication (HCC)    Diverticulosis    GERD (gastroesophageal reflux disease)    GI bleed 05/15/2019   Gout    Heart murmur    Hematemesis 05/15/2019   High cholesterol    Hypertension    STEMI (ST elevation myocardial infarction)  (HCC) 2003   Inferior   Past Surgical History:  Procedure Laterality Date   CAPD INSERTION N/A 08/13/2020   Procedure: LAPAROSCOPIC INSERTION CONTINUOUS AMBULATORY PERITONEAL DIALYSIS  (CAPD) CATHETER;  Surgeon: Campbell Lerner, MD;  Location: [facility] ORS;  Service: General;  Laterality: N/A;   CARDIAC CATHETERIZATION     CATARACT EXTRACTION W/PHACO Right 07/05/2019   Procedure: CATARACT EXTRACTION PHACO AND INTRAOCULAR LENS PLACEMENT (IOC) RIGHT DIABETIC;  Surgeon: Galen Manila, MD;  Location: [facility] ORS;  Service: Ophthalmology;  Laterality: Right;  US00:59.3 CDE8.41 LOT [id number] h   COLONOSCOPY WITH PROPOFOL N/A 04/01/2017   Procedure: COLONOSCOPY WITH PROPOFOL;  Surgeon: Christena Deem, MD;  Location: [facility] ENDOSCOPY;  Service: Endoscopy;  Laterality: N/A;   CORONARY ANGIOPLASTY WITH STENT PLACEMENT  10/2001   RCA   CORONARY ANGIOPLASTY WITH STENT PLACEMENT  07/2015   LAD   right hand surgery  2015   3 screws in right hand   TOTAL HIP ARTHROPLASTY Left 04/22/2020   Procedure: TOTAL HIP ARTHROPLASTY ANTERIOR APPROACH;  Surgeon: Kennedy Bucker, MD;  Location: [facility] ORS;  Service: Orthopedics;  Laterality: Left;   Social History:  reports that he quit smoking about 21 years ago. His smoking use included cigarettes. He has never used smokeless tobacco. He reports that he does not drink alcohol and does not use drugs.  Allergies  Allergen Reactions   Morphine Anxiety    agitation  Family History  Problem Relation Age of Onset   Heart disease Mother    Heart disease Father    CAD Other     Prior to Admission medications   Medication Sig Start Date End Date Taking? Authorizing Provider  allopurinol (ZYLOPRIM) 100 MG tablet Take 100 mg by mouth daily.   Yes [provider]  apixaban (ELIQUIS) 2.5 MG TABS tablet Take 2.5 mg by mouth 2 (two) times daily.   Yes [provider]  aspirin 81 MG EC tablet Take 81 mg by mouth daily.   Yes [provider]   calcitRIOL (ROCALTROL) 0.25 MCG capsule Take 1 capsule (0.25 mcg total) by mouth daily. 04/25/22  Yes Loyce Dys, MD  escitalopram (LEXAPRO) 10 MG tablet Take 10 mg by mouth daily. 05/25/22 05/20/23 Yes [provider]  folic acid (FOLVITE) 1 MG tablet TAKE 2 TABLETS(2 MG) BY MOUTH DAILY Patient taking differently: 1 mg. Take 1 tablet daily 03/27/21  Yes Creig Hines, MD  gabapentin (NEURONTIN) 300 MG capsule Take 300 mg by mouth at bedtime. 09/25/16  Yes [provider]  hydrocortisone (ANUSOL-HC) 2.5 % rectal cream Place 1 application rectally 2 (two) times daily as needed for hemorrhoids or anal itching.   Yes [provider]  isosorbide mononitrate (IMDUR) 60 MG 24 hr tablet Take 60 mg by mouth daily.   Yes [provider]  meclizine (ANTIVERT) 25 MG tablet Take 1 tablet (25 mg total) by mouth 2 (two) times daily. 04/24/22  Yes Loyce Dys, MD  methocarbamol (ROBAXIN) 500 MG tablet Take 500 mg by mouth 3 (three) times daily as needed. 11/13/20  Yes [provider]  metoprolol succinate (TOPROL-XL) 100 MG 24 hr tablet Take 100 mg by mouth daily. Take with or immediately following a meal.   Yes [provider]  omeprazole (PRILOSEC) 20 MG capsule Take 20 mg by mouth daily.   Yes [provider]  psyllium (METAMUCIL) 58.6 % packet Take 1 packet by mouth 2 (two) times daily.   Yes [provider]  rosuvastatin (CRESTOR) 40 MG tablet Take 40 mg by mouth daily. 03/06/19  Yes [provider]  Vitamin D, Ergocalciferol, (DRISDOL) 1.25 MG (50000 UNIT) CAPS capsule Take 50,000 Units by mouth once a week. 10/23/20  Yes [provider]  gentamicin cream (GARAMYCIN) 0.1 % Apply 1 application topically daily. Patient not taking: Reported on 06/26/2022 03/11/21   Esaw Grandchild A, DO  nitroGLYCERIN (NITROSTAT) 0.4 MG SL tablet Place 0.4 mg under the tongue every 5 (five) minutes as needed for chest pain.    [provider]    Physical Exam: Vitals:   06/26/22 0355 06/26/22 0400 06/26/22 0430 06/26/22 0451  BP:  (!) 155/86 (!) 185/93   Pulse:  (!) 25  64  Resp:      Temp: 97.8 F (36.6 C)     TempSrc: Axillary     SpO2:  100%    Weight:      Height:       Physical Exam Vitals and nursing note reviewed.  Constitutional:      General: He is not in acute distress. HENT:     Head: Normocephalic and atraumatic.  Cardiovascular:     Rate and Rhythm: Normal rate and regular rhythm.     Heart sounds: Normal heart sounds.  Pulmonary:     Effort: Pulmonary effort is normal.     Breath sounds: Normal breath sounds.  Abdominal:  Palpations: Abdomen is soft.     Tenderness: There is no abdominal tenderness.  Neurological:     General: No focal deficit present.     Mental Status: He is lethargic and disoriented.     Labs on Admission: I have personally reviewed following labs and imaging studies  CBC: Recent Labs  Lab 06/26/22 0209  WBC 10.6*  HGB 10.6*  HCT 34.1*  MCV 100.6*  PLT 143*   Basic Metabolic Panel: Recent Labs  Lab 06/26/22 0209  NA 141  K 4.1  CL 110  CO2 20*  GLUCOSE 213*  BUN 26*  CREATININE 5.34*  CALCIUM 8.1*   GFR: Estimated Creatinine Clearance: 17.8 mL/min (A) (by C-G formula based on SCr of 5.34 mg/dL (H)). Liver Function Tests: Recent Labs  Lab 06/26/22 0209  AST 47*  ALT 25  ALKPHOS 109  BILITOT 0.9  PROT 6.2*  ALBUMIN 2.9*   No results for input(s): "LIPASE", "AMYLASE" in the last 168 hours. Recent Labs  Lab 06/26/22 0209  AMMONIA 14   Coagulation Profile: No results for input(s): "INR", "PROTIME" in the last 168 hours. Cardiac Enzymes: No results for input(s): "CKTOTAL", "CKMB", "CKMBINDEX", "TROPONINI" in the last 168 hours. BNP (last 3 results) No results for input(s): "PROBNP" in the last 8760 hours. HbA1C: No results for input(s): "HGBA1C" in the last 72 hours. CBG: No results for input(s): "GLUCAP" in the last 168  hours. Lipid Profile: No results for input(s): "CHOL", "HDL", "LDLCALC", "TRIG", "CHOLHDL", "LDLDIRECT" in the last 72 hours. Thyroid Function Tests: No results for input(s): "TSH", "T4TOTAL", "FREET4", "T3FREE", "THYROIDAB" in the last 72 hours. Anemia Panel: No results for input(s): "VITAMINB12", "FOLATE", "FERRITIN", "TIBC", "IRON", "RETICCTPCT" in the last 72 hours. Urine analysis:    Component Value Date/Time   COLORURINE YELLOW (A) 04/18/2022 1840   APPEARANCEUR CLEAR (A) 04/18/2022 1840   LABSPEC 1.015 04/18/2022 1840   PHURINE 6.0 04/18/2022 1840   GLUCOSEU 150 (A) 04/18/2022 1840   HGBUR MODERATE (A) 04/18/2022 1840   BILIRUBINUR NEGATIVE 04/18/2022 1840   KETONESUR 20 (A) 04/18/2022 1840   PROTEINUR >=300 (A) 04/18/2022 1840   NITRITE NEGATIVE 04/18/2022 1840   LEUKOCYTESUR NEGATIVE 04/18/2022 1840    Radiological Exams on Admission: CT HEAD WO CONTRAST ( )  Result Date: 06/26/2022 CLINICAL DATA:  Head trauma, minor (Age >= 65y) Possible unwitnessed fall, found on the floor. EXAM: CT HEAD WITHOUT CONTRAST TECHNIQUE: Contiguous axial images were obtained from the base of the skull through the vertex without intravenous contrast. RADIATION DOSE REDUCTION: This exam was performed according to the departmental dose-optimization program which includes automated exposure control, adjustment of the mA and/or kV according to patient size and/or use of iterative reconstruction technique. COMPARISON:  04/18/2022 FINDINGS: Brain: No intracranial hemorrhage, mass effect, or midline shift. Normal for age atrophy. No hydrocephalus. The basilar cisterns are patent. Unchanged periventricular and deep white matter hypodensity typical of chronic small vessel ischemia. Remote right basal gangliar lacunar infarct. No evidence of territorial infarct or acute ischemia. No extra-axial or intracranial fluid collection. Vascular: Atherosclerosis of skullbase vasculature without hyperdense vessel or  abnormal calcification. Skull: No fracture or focal lesion. Sinuses/Orbits: Paranasal sinuses and mastoid air cells are clear. The visualized orbits are unremarkable. Other: None. IMPRESSION: 1. No acute intracranial abnormality. No skull fracture. 2. Unchanged chronic small vessel ischemia and remote right basal gangliar lacunar infarct. Electronically Signed   By: Narda Rutherford M.D.   On: 06/26/2022 02:41   DG Chest Portable 1 View  Result Date: 06/26/2022 CLINICAL DATA:  Hypoxia. Patient was found on the floor. Unwitnessed fall. EXAM: PORTABLE CHEST 1 VIEW COMPARISON:  04/18/2022 FINDINGS: Shallow inspiration. Linear atelectasis in the right lung base. Heart size is normal for technique. Since the previous study, there is increased perihilar hazy opacities suggesting developing infiltration or edema. Possible small right pleural effusion. No pneumothorax. Mediastinal contours appear intact. Calcification of the aorta. IMPRESSION: 1. Developing hazy perihilar infiltrates possibly edema or pneumonia. 2. Linear atelectasis in the right lung base. 3. Possible small right pleural effusion. Electronically Signed   By: Burman Nieves M.D.   On: 06/26/2022 01:29     Data Reviewed: Relevant notes from primary care and specialist visits, past discharge summaries as available in EHR, including Care Everywhere. Prior diagnostic testing as pertinent to current admission diagnoses Updated medications and problem lists for reconciliation ED course, including vitals, labs, imaging, treatment and response to treatment Triage notes, nursing and pharmacy notes and ED provider's notes Notable results as noted in HPI   Assessment and Plan: * CAP (community acquired pneumonia) Rocephin and azithromycin Supplemental oxygen Antitussives, DuoNebs as needed N.p.o. until more alert  Acute respiratory failure with hypoxia (HCC) Possible fluid overload Possible pneumonia Patient hypoxic to 86% on room air in the  setting of missed dialysis x 4 days Chest x-ray with edema versus infiltrate as well as right pleural effusion Received Lasix in the ED Nephrology consult for dialysis Treat possible pneumonia  Acute metabolic encephalopathy Etiology uncertain, hypertensive encephalopathy versus related to possible pneumonia Head CT was negative Will keep n.p.o. due to lethargy Fall and aspiration precautions  Hypertensive urgency Secondary to missed dialysis As needed hydralazine Renal consult for assistance with peritoneal dialysis  PAF (paroxysmal atrial fibrillation) (HCC) on chronic anticoagulation Continue apixaban and metoprolol  ESRD on peritoneal dialysis (HCC) Anemia of CKD Nephrology consult Hemoglobin stable  CAD (coronary artery disease) No complaints of chest pain, EKG nonacute  DM type 2 (diabetes mellitus, type 2) (HCC) Sliding scale insulin coverage   DVT prophylaxis: Eliquis  Consults: Nephrology, Dr. Wynelle Link  Advance Care Planning:   Code Status: Prior   Family Communication: Wife at bedside  Disposition Plan: Back to previous home environment  Severity of Illness: The appropriate patient status for this patient is INPATIENT. Inpatient status is judged to be reasonable and necessary in order to provide the required intensity of service to ensure the patient's safety. The patient's presenting symptoms, physical exam findings, and initial radiographic and laboratory data in the context of their chronic comorbidities is felt to place them at high risk for further clinical deterioration. Furthermore, it is not anticipated that the patient will be medically stable for discharge from the hospital within 2 midnights of admission.   * I certify that at the point of admission it is my clinical judgment that the patient will require inpatient hospital care spanning beyond 2 midnights from the point of admission due to high intensity of service, high risk for further deterioration  and high frequency of surveillance required.*  Author: Andris Baumann, MD 06/26/2022 5:48 AM  For on call review [email or web address].

## 2022-06-26 NOTE — ED Notes (Signed)
Per Family at bedside pt will stir occasionally but remains asleep. Will cont to monitor

## 2022-06-26 NOTE — Assessment & Plan Note (Addendum)
Rocephin and azithromycin Supplemental oxygen Antitussives, DuoNebs as needed N.p.o. until more alert

## 2022-06-26 NOTE — Progress Notes (Signed)
PD tx initation note  Pre TX VS:see table below  Pre TX weight:61kg bed sclae  PD treatment initiated via aseptic technique. Consent signed and in chart. Patient is alert and oriented. No complaints of pain. No specimen collected. PD exit site clean, dry and intact. Gentamycin and new dressing applied. Bedside RN educated on PD machine and how to contact tech support when PD machine alarms.    06/26/22 1955  Vitals  Temp 98.8 F (37.1 C)  Temp Source Oral  BP (!) 153/84  MAP (mmHg) 103  BP Method Automatic  Patient Position (if appropriate) Lying  Pulse Rate 79  Pulse Rate Source Monitor  ECG Heart Rate (!) 108  Resp (!) 29  Level of Consciousness  Level of Consciousness Responds to Voice  MEWS COLOR  MEWS Score Color Red  Oxygen Therapy  SpO2 92 %  O2 Device Room Air  Pain Assessment  Pain Scale 0-10  Pain Score 0  PCA/Epidural/Spinal Assessment  Respiratory Pattern Regular  Height and Weight  Weight 61 kg  Type of Scale Used Bed  Type of Weight Pre-Dialysis  BMI (Calculated) 19.85  ECG Monitoring  Cardiac Rhythm ST  MEWS Score  MEWS Temp 0  MEWS Systolic 0  MEWS Pulse 1  MEWS RR 2  MEWS LOC 1  MEWS Score 4     Hulen Shouts RN [facility]

## 2022-06-26 NOTE — ED Notes (Signed)
Pt placed on [place] 2L due to poor O2 sats at 85%. Increased to 3L with O2 sats at 89%

## 2022-06-26 NOTE — Assessment & Plan Note (Signed)
No complaints of chest pain, EKG nonacute

## 2022-06-26 NOTE — ED Notes (Signed)
Pt cleaned of stool by this RN and Biochemist, clinical. Pt sheets changed, brief placed, condom catheter placed, pt given warm blankets. Pt is agitated and per Kylie pt is pulling at cords and lines. MD to be notified.

## 2022-06-26 NOTE — Assessment & Plan Note (Signed)
Possible fluid overload Possible pneumonia Patient hypoxic to 86% on room air in the setting of missed dialysis x 4 days Chest x-ray with edema versus infiltrate as well as right pleural effusion Received Lasix in the ED Nephrology consult for dialysis Treat possible pneumonia

## 2022-06-26 NOTE — Assessment & Plan Note (Signed)
Secondary to missed dialysis As needed hydralazine Renal consult for assistance with peritoneal dialysis

## 2022-06-26 NOTE — Assessment & Plan Note (Addendum)
Continue apixaban and metoprolol 

## 2022-06-26 NOTE — Progress Notes (Signed)
SLP Cancellation Note  Patient Details Name: Casey French MRN: [id number] DOB: Aug 25, 1950   Cancelled treatment:       Reason Eval/Treat Not Completed: Patient not medically ready  Pt currently sleeping d/t recent administration of ativan. In speaking with pt's nurse, recommend nurse administer swallow screen once pt is fully awake and less combative. Secure chat sent to pt's attending (Dr Enedina Finner).   If pt fails screen or additional aspiration/dysphagia concerns arise, ST can evaluate/treat at next available time.   Zachary Lovins B. Dreama Saa, M.S., CCC-SLP, CBIS Speech-Language Pathologist Certified Brain Injury Specialist [facility] [phone] Ascom [phone] Fax [phone]  [name] Dreama Saa 06/26/2022, 10:12 AM

## 2022-06-26 NOTE — Progress Notes (Addendum)
[facility]  - [place] [facility]   PATIENT NAME: Casey French    MR#:  [id number]  DATE OF BIRTH:  01-Nov-1950  SUBJECTIVE:   Daughter at bedside. Patient sedated since he was combative earlier received Ativan. Came in apparently with altered mental status had stopped PD and PO blood pressure meds at home. Blood pressure significantly high.   VITALS:  Blood pressure (!) 149/73, pulse (!) 104, temperature 97.9 F (36.6 C), temperature source Axillary, resp. rate (!) 29, height 5\' 9"  (1.753 m), weight (!) 141.5 kg, SpO2 98 %.  PHYSICAL EXAMINATION:  Limited due to being sedated GENERAL:  72 y.o.-year-old patient with no acute distress.  LUNGS: Normal breath sounds bilaterally, no wheezing CARDIOVASCULAR: S1, S2 normal. No murmur   ABDOMEN: Soft, nontender, nondistended.PD cath+ EXTREMITIES: No  edema b/l.    NEUROLOGIC: nonfocal  patient is sedated   LABORATORY PANEL:  CBC Recent Labs  Lab 06/26/22 0209  WBC 10.6*  HGB 10.6*  HCT 34.1*  PLT 143*    Chemistries  Recent Labs  Lab 06/26/22 0209  NA 141  K 4.1  CL 110  CO2 20*  GLUCOSE 213*  BUN 26*  CREATININE 5.34*  CALCIUM 8.1*  AST 47*  ALT 25  ALKPHOS 109  BILITOT 0.9    RADIOLOGY:  CT HEAD WO CONTRAST ( )  Result Date: 06/26/2022 CLINICAL DATA:  Head trauma, minor (Age >= 65y) Possible unwitnessed fall, found on the floor. EXAM: CT HEAD WITHOUT CONTRAST TECHNIQUE: Contiguous axial images were obtained from the base of the skull through the vertex without intravenous contrast. RADIATION DOSE REDUCTION: This exam was performed according to the departmental dose-optimization program which includes automated exposure control, adjustment of the mA and/or kV according to patient size and/or use of iterative reconstruction technique. COMPARISON:  04/18/2022 FINDINGS: Brain: No intracranial hemorrhage, mass effect, or midline shift. Normal for age atrophy. No hydrocephalus. The basilar  cisterns are patent. Unchanged periventricular and deep white matter hypodensity typical of chronic small vessel ischemia. Remote right basal gangliar lacunar infarct. No evidence of territorial infarct or acute ischemia. No extra-axial or intracranial fluid collection. Vascular: Atherosclerosis of skullbase vasculature without hyperdense vessel or abnormal calcification. Skull: No fracture or focal lesion. Sinuses/Orbits: Paranasal sinuses and mastoid air cells are clear. The visualized orbits are unremarkable. Other: None. IMPRESSION: 1. No acute intracranial abnormality. No skull fracture. 2. Unchanged chronic small vessel ischemia and remote right basal gangliar lacunar infarct. Electronically Signed   By: Narda Rutherford M.D.   On: 06/26/2022 02:41   DG Chest Portable 1 View  Result Date: 06/26/2022 CLINICAL DATA:  Hypoxia. Patient was found on the floor. Unwitnessed fall. EXAM: PORTABLE CHEST 1 VIEW COMPARISON:  04/18/2022 FINDINGS: Shallow inspiration. Linear atelectasis in the right lung base. Heart size is normal for technique. Since the previous study, there is increased perihilar hazy opacities suggesting developing infiltration or edema. Possible small right pleural effusion. No pneumothorax. Mediastinal contours appear intact. Calcification of the aorta. IMPRESSION: 1. Developing hazy perihilar infiltrates possibly edema or pneumonia. 2. Linear atelectasis in the right lung base. 3. Possible small right pleural effusion. Electronically Signed   By: Burman Nieves M.D.   On: 06/26/2022 01:29    Assessment and Plan VIOLA MICHALCZYK is a 71 y.o. male with medical history significant for CAD, DM, HTN, A-fib on Eliquis, history of GI bleed and ESRD on PD who presents to the ED with altered mental status described as confusion and combativeness that  developed over the past 3 to 4 days which is unusual for him. Per family patient had stopped doing [facility] dialysis and also stopped taking his  medications for last four days.  Patient was combative in the ER. Had to give him IV Ativan. During my evaluation daughter was at bedside. Patient sedated unable to give any history review of systems.  Acute metabolic encephalopathy suspected due to hypertensive urgency/rebound hypertension with not taking BP meds, suspected uremia with not doing PD for four days -- patient systolic blood pressure was more than 200 persistently along with diastolic>100-110.  --Patient  on nicardipine gtt. -- Resumed his home meds when able to take. -- Patient's PD will be resumed tonight.  Acute hypoxic respiratory failure with possible fluid overload in the setting of not doing PD and possible pneumonia (radiological) --Patient hypoxic to 86% on room air in the setting of missed dialysis x 4 days --Chest x-ray with edema versus infiltrate as well as right pleural effusion --Received Lasix in the ED --Nephrology consult for dialysis-- per Dr. Wynelle Link PD will be started tonight -- continue antibiotics -- check Pro calcitonin  PAF (paroxysmal atrial fibrillation) (HCC) on chronic anticoagulation Continue apixaban and metoprolol   ESRD on peritoneal dialysis (HCC) Anemia of CKD Nephrology consult Hemoglobin stable   CAD (coronary artery disease) No complaints of chest pain, EKG nonacute   DM type 2 (diabetes mellitus, type 2) (HCC) Sliding scale insulin coverage  Given multiple medical problems will have palliative care see patient and discuss goals of care with patient and family      Procedures: Family communication : daughter at bedside Consults : nephrology CODE STATUS: full DVT Prophylaxis : heparin Level of care: Stepdown Status is: Inpatient Remains inpatient appropriate because: metabolic encephalopathy, hypertensive urgency    TOTAL critical TIME TAKING CARE OF THIS PATIENT: 50 minutes.  >50% time spent on counselling and coordination of care  Note: This dictation was prepared  with Dragon dictation along with smaller phrase technology. Any transcriptional errors that result from this process are unintentional.  Enedina Finner M.D    [facility]   CC: Primary care physician; Jerl Mina, MD

## 2022-06-26 NOTE — ED Provider Notes (Signed)
[facility] Provider Note    Event Date/Time   First MD Initiated Contact with Patient 06/26/22 [phone]     (approximate)  History   Chief Complaint: Weakness  HPI  Casey French is a 72 y.o. male with a past medical history of CKD on peritoneal dialysis, diabetes, gastric reflux, hypertension, anemia, presents to the emergency department for a possible fall confusion and combativeness.  According to EMS they state per wife patient is usually alert and oriented x 4 is ESRD on peritoneal dialysis but per wife likely has not performed peritoneal dialysis in 3 to 4 days.  And route to the hospital patient moans at times combative pulls off monitors, etc.  More of the same currently unable to obtain vital signs due to combativeness.  Patient does appear to be somewhat hypoxic when we are able to get a pulse ox around 89%.  Unwilling to wear supplemental oxygen.  Physical Exam   Most recent vital signs: There were no vitals filed for this visit.  General: Awake, combative at times. CV:  Good peripheral perfusion.  Regular rate and rhythm  Resp:  Normal effort.  Equal breath sounds bilaterally.  Abd:  No distention.  Soft, nontender.  Peritoneal dialysis catheter present.  No obvious reaction to abdominal palpation.   ED Results / Procedures / Treatments   EKG  EKG viewed and interpreted by myself shows a sinus rhythm at 93 bpm with a narrow QRS, normal axis, normal intervals, no concerning ST changes  RADIOLOGY  I have reviewed and interpreted chest x-ray images.  Patient appears to have some haziness bilaterally and a pleural effusion on the right side. Radiology has read the x-ray as perihilar infiltrates edema versus pneumonia with linear atelectasis of the right lung base possible right pleural effusion   MEDICATIONS ORDERED IN ED: Medications  haloperidol lactate (HALDOL) injection 5 mg (has no administration in time range)  LORazepam (ATIVAN)  injection 2 mg (has no administration in time range)     IMPRESSION / MDM / ASSESSMENT AND PLAN / ED COURSE  I reviewed the triage vital signs and the nursing notes.  Patient's presentation is most consistent with acute presentation with potential threat to life or bodily function.  Patient presents to the emergency department with confusion and combativeness possible fall.  Per EMS per wife patient is typically alert and oriented x 4, cooperative.  Unable to obtain vital signs or IV at this time due to combative behavior with attempts.  We will dose IM Haldol and Ativan so that we may obtain further workup such as lab work chest x-ray and a CT scan of the head.  Per report patient has not had peritoneal dialysis in 3 to 4 days concern for possible hyperkalemia, no obvious EKG findings at this time.  Concern for fluid overload will obtain labs and chest x-ray to evaluate as the patient at times appears to be hypoxic although difficult to assess due to combative behaviors at times.  Wife is here who states the patient is normally alert and oriented x 4 and usually calm cooperative at times will get slightly confused but is never got combative or confused like this.  Patient is mildly hypoxic at times into the 80s currently is on room air satting in the low 90s.  Given his chest x-ray showing possible edema versus pneumonia with hypoxia we will dose IV Lasix.  Wife states it has been approximately 4 days since the patient last did  peritoneal dialysis at home.  No other infectious symptoms such as fever or cough per wife.  CBC shows no significant finding besides slight leukocytosis, chemistry shows creatinine 5.3 but reassuringly normal potassium and a BUN only 26.  Ammonia is normal at 14.  CT scan of the head shows no acute finding.  Given the patient's continued altered mental status/confusion we will admit to the hospital service for further workup and treatment.  FINAL CLINICAL IMPRESSION(S) / ED  DIAGNOSES   Altered mental status Confusion   Note:  This document was prepared using Dragon voice recognition software and may include unintentional dictation errors.   Minna Antis, MD 06/26/22 [phone]

## 2022-06-27 DIAGNOSIS — I160 Hypertensive urgency: Secondary | ICD-10-CM | POA: Diagnosis not present

## 2022-06-27 DIAGNOSIS — E119 Type 2 diabetes mellitus without complications: Secondary | ICD-10-CM

## 2022-06-27 DIAGNOSIS — N186 End stage renal disease: Secondary | ICD-10-CM | POA: Diagnosis not present

## 2022-06-27 DIAGNOSIS — G9341 Metabolic encephalopathy: Secondary | ICD-10-CM | POA: Diagnosis not present

## 2022-06-27 DIAGNOSIS — Z992 Dependence on renal dialysis: Secondary | ICD-10-CM

## 2022-06-27 MED ORDER — ISOSORBIDE MONONITRATE ER 30 MG PO TB24
60.0000 mg | ORAL_TABLET | Freq: Every day | ORAL | Status: DC
  Administered 2022-06-27 – 2022-06-28 (×2): 60 mg via ORAL
  Filled 2022-06-27 (×2): qty 2

## 2022-06-27 MED ORDER — CALCITRIOL 0.25 MCG PO CAPS
0.2500 ug | ORAL_CAPSULE | Freq: Every day | ORAL | Status: DC
  Administered 2022-06-27 – 2022-06-28 (×2): 0.25 ug via ORAL
  Filled 2022-06-27 (×2): qty 1

## 2022-06-27 MED ORDER — GABAPENTIN 300 MG PO CAPS
300.0000 mg | ORAL_CAPSULE | Freq: Every day | ORAL | Status: DC
  Administered 2022-06-27: 300 mg via ORAL
  Filled 2022-06-27: qty 1

## 2022-06-27 MED ORDER — FOLIC ACID 1 MG PO TABS
1.0000 mg | ORAL_TABLET | Freq: Every day | ORAL | Status: DC
  Administered 2022-06-27 – 2022-06-28 (×2): 1 mg via ORAL
  Filled 2022-06-27 (×2): qty 1

## 2022-06-27 MED ORDER — METOPROLOL TARTRATE 50 MG PO TABS
50.0000 mg | ORAL_TABLET | Freq: Two times a day (BID) | ORAL | Status: DC
  Administered 2022-06-27 – 2022-06-28 (×2): 50 mg via ORAL
  Filled 2022-06-27 (×2): qty 1

## 2022-06-27 MED ORDER — LORAZEPAM 2 MG/ML IJ SOLN: 0.5000 mg | Freq: Every day | INTRAMUSCULAR | Status: DC | PRN

## 2022-06-27 MED ORDER — ESCITALOPRAM OXALATE 10 MG PO TABS
10.0000 mg | ORAL_TABLET | Freq: Every day | ORAL | Status: DC
  Administered 2022-06-27 – 2022-06-28 (×2): 10 mg via ORAL
  Filled 2022-06-27 (×2): qty 1

## 2022-06-27 MED ORDER — ALLOPURINOL 100 MG PO TABS
100.0000 mg | ORAL_TABLET | Freq: Every day | ORAL | Status: DC
  Administered 2022-06-27 – 2022-06-28 (×2): 100 mg via ORAL
  Filled 2022-06-27 (×2): qty 1

## 2022-06-27 NOTE — Progress Notes (Signed)
PD post treatment note  PD treatment completed. Patient tolerated treatment well. PD effluent is clear. No specimen collected. PD exit site clean, dry and intact. Patient is oriented and in no acute distress. Report given to bedside nurse.  Post treatment VS:see table below  Total UF removed: -597   Post treatment weight: 60.4kg bed scale    06/27/22 0456  Vitals  Temp 98.3 F (36.8 C)  Temp Source Oral  BP 133/76  MAP (mmHg) 92  BP Method Automatic  Patient Position (if appropriate) Lying  Pulse Rate 93  Pulse Rate Source Monitor  ECG Heart Rate 96  Resp (!) 23  MEWS COLOR  MEWS Score Color Green  Oxygen Therapy  SpO2 92 %  O2 Device Room Air  Height and Weight  Weight 60.4 kg  Type of Scale Used Bed  Type of Weight Post-Dialysis  BMI (Calculated) 19.66  MEWS Score  MEWS Temp 0  MEWS Systolic 0  MEWS Pulse 0  MEWS RR 1  MEWS LOC 0  MEWS Score 1     Hulen Shouts RN [facility]

## 2022-06-27 NOTE — Progress Notes (Signed)
Peritoneal Dialysis Treatment Initiation Note   Pre Treatment Weight: 68.6 kg  Consent signed and in chart.  PD treatment initiated via aseptic technique.   Patient is awake and alert. No complaints of pain.   PD exit site clean, dry and intact.  Gentamicin and new dressing applied.   Hand-off given to the patient's nurse.  Education provided to dept staff  regarding PD machine and how  to contact tech support if machine  alarms.    Ralene Muskrat RN [facility]

## 2022-06-27 NOTE — Progress Notes (Signed)
[facility]  ROUNDING NOTE   Subjective:   [name] was admitted to [facility] on 06/26/2022 for Confusion [R41.0] Acute pulmonary edema (HCC) [J81.0] Hypertensive urgency, malignant [I16.0] Altered mental status, unspecified altered mental status type [R41.82] Acute metabolic encephalopathy [G93.41]  Peritoneal dialysis last night. Tolerated treatment well.   Patient states he does not want to harm himself.   Weaned off nicardipine gtt.   Objective:  Vital signs in last 24 hours:  Temp:  [97.7 F (36.5 C)-98.8 F (37.1 C)] 97.9 F (36.6 C) (06/02 1200) Pulse Rate:  [70-119] 81 (06/02 1200) Resp:  [20-35] 35 (06/02 1200) BP: (116-199)/(69-96) 145/75 (06/02 1100) SpO2:  [90 %-100 %] 93 % (06/02 1200) Weight:  [60.4 kg-61 kg] 60.4 kg (06/02 0500)  Weight change: -80.5 kg Filed Weights   06/26/22 1955 06/27/22 0456 06/27/22 0500  Weight: 61 kg 60.4 kg 60.4 kg    Intake/Output: I/O last 3 completed shifts: In: 756.4 [P.O.:120; I.V.:508.4; IV Piggyback:725] Out: 300 [Urine:300]   Intake/Output this shift:  Total I/O In: 240 [P.O.:240] Out: 200 [Urine:200]  Physical Exam: General: NAD, laying in bed  Head: Normocephalic, atraumatic. Moist oral mucosal membranes  Eyes: Anicteric, PERRL  Neck: Supple, trachea midline  Lungs:  Clear to auscultation  Heart: Regular rate and rhythm  Abdomen:  Soft, nontender,   Extremities:  no peripheral edema.  Neurologic: Somnolent.   Skin: No lesions  Access: PD catheter    Basic Metabolic Panel: Recent Labs  Lab 06/26/22 0209  NA 141  K 4.1  CL 110  CO2 20*  GLUCOSE 213*  BUN 26*  CREATININE 5.34*  CALCIUM 8.1*     Liver Function Tests: Recent Labs  Lab 06/26/22 0209  AST 47*  ALT 25  ALKPHOS 109  BILITOT 0.9  PROT 6.2*  ALBUMIN 2.9*    No results for input(s): "LIPASE", "AMYLASE" in the last 168 hours. Recent Labs  Lab 06/26/22 0209  AMMONIA 14     CBC: Recent Labs  Lab  06/26/22 0209  WBC 10.6*  HGB 10.6*  HCT 34.1*  MCV 100.6*  PLT 143*     Cardiac Enzymes: No results for input(s): "CKTOTAL", "CKMB", "CKMBINDEX", "TROPONINI" in the last 168 hours.  BNP: Invalid input(s): "POCBNP"  CBG: Recent Labs  Lab 06/26/22 1735  GLUCAP 116*    Microbiology: Results for orders placed or performed during the hospital encounter of 06/26/22  SARS Coronavirus 2 by RT PCR (hospital order, performed in [facility] hospital lab) *cepheid single result test* Anterior Nasal Swab     Status: None   Collection Time: 06/26/22  5:13 PM   Specimen: Anterior Nasal Swab  Result Value Ref Range Status   SARS Coronavirus 2 by RT PCR NEGATIVE NEGATIVE Final    Comment: (NOTE) SARS-CoV-2 target nucleic acids are NOT DETECTED.  The SARS-CoV-2 RNA is generally detectable in upper and lower respiratory specimens during the acute phase of infection. The lowest concentration of SARS-CoV-2 viral copies this assay can detect is 250 copies / mL. A negative result does not preclude SARS-CoV-2 infection and should not be used as the sole basis for treatment or other patient management decisions.  A negative result may occur with improper specimen collection / handling, submission of specimen other than nasopharyngeal swab, presence of viral mutation(s) within the areas targeted by this assay, and inadequate number of viral copies (<250 copies / mL). A negative result must be combined with clinical observations, patient history, and epidemiological  information.  Fact Sheet for Patients:   RoadLapTop.co.za  Fact Sheet for Healthcare Providers: http://kim-miller.com/  This test is not yet approved or  cleared by the [place] FDA and has been authorized for detection and/or diagnosis of SARS-CoV-2 by FDA under an Emergency Use Authorization (EUA).  This EUA will remain in effect (meaning this test can be used) for the  duration of the COVID-19 declaration under Section 564(b)(1) of the Act, 21 U.S.C. section 360bbb-3(b)(1), unless the authorization is terminated or revoked sooner.  Performed at [facility], [address] [place], [place] [address]   MRSA Next Gen by PCR, Nasal     Status: None   Collection Time: 06/26/22  5:31 PM   Specimen: Nasal Mucosa; Nasal Swab  Result Value Ref Range Status   MRSA by PCR Next Gen NOT DETECTED NOT DETECTED Final    Comment: (NOTE) The GeneXpert MRSA Assay (FDA approved for NASAL specimens only), is one component of a comprehensive MRSA colonization surveillance program. It is not intended to diagnose MRSA infection nor to guide or monitor treatment for MRSA infections. Test performance is not FDA approved in patients less than 13 years old. Performed at [facility], [address] [place], [place] [address]     Coagulation Studies: No results for input(s): "LABPROT", "INR" in the last 72 hours.  Urinalysis: Recent Labs    06/26/22 0727  COLORURINE STRAW*  LABSPEC 1.007  PHURINE 8.0  GLUCOSEU 150*  HGBUR SMALL*  BILIRUBINUR NEGATIVE  KETONESUR 5*  PROTEINUR >=300*  NITRITE NEGATIVE  LEUKOCYTESUR NEGATIVE       Imaging: CT HEAD WO CONTRAST ( )  Result Date: 06/26/2022 CLINICAL DATA:  Head trauma, minor (Age >= 65y) Possible unwitnessed fall, found on the floor. EXAM: CT HEAD WITHOUT CONTRAST TECHNIQUE: Contiguous axial images were obtained from the base of the skull through the vertex without intravenous contrast. RADIATION DOSE REDUCTION: This exam was performed according to the departmental dose-optimization program which includes automated exposure control, adjustment of the mA and/or kV according to patient size and/or use of iterative reconstruction technique. COMPARISON:  04/18/2022 FINDINGS: Brain: No intracranial hemorrhage, mass effect, or midline shift. Normal for age atrophy. No hydrocephalus. The basilar  cisterns are patent. Unchanged periventricular and deep white matter hypodensity typical of chronic small vessel ischemia. Remote right basal gangliar lacunar infarct. No evidence of territorial infarct or acute ischemia. No extra-axial or intracranial fluid collection. Vascular: Atherosclerosis of skullbase vasculature without hyperdense vessel or abnormal calcification. Skull: No fracture or focal lesion. Sinuses/Orbits: Paranasal sinuses and mastoid air cells are clear. The visualized orbits are unremarkable. Other: None. IMPRESSION: 1. No acute intracranial abnormality. No skull fracture. 2. Unchanged chronic small vessel ischemia and remote right basal gangliar lacunar infarct. Electronically Signed   By: Narda Rutherford M.D.   On: 06/26/2022 02:41   DG Chest Portable 1 View  Result Date: 06/26/2022 CLINICAL DATA:  Hypoxia. Patient was found on the floor. Unwitnessed fall. EXAM: PORTABLE CHEST 1 VIEW COMPARISON:  04/18/2022 FINDINGS: Shallow inspiration. Linear atelectasis in the right lung base. Heart size is normal for technique. Since the previous study, there is increased perihilar hazy opacities suggesting developing infiltration or edema. Possible small right pleural effusion. No pneumothorax. Mediastinal contours appear intact. Calcification of the aorta. IMPRESSION: 1. Developing hazy perihilar infiltrates possibly edema or pneumonia. 2. Linear atelectasis in the right lung base. 3. Possible small right pleural effusion. Electronically Signed   By: Burman Nieves M.D.   On:  06/26/2022 01:29     Medications:    dialysis solution 1.5% low-MG/low-CA      allopurinol  100 mg Oral Daily   amLODipine  10 mg Oral Daily   apixaban  2.5 mg Oral BID   calcitRIOL  0.25 mcg Oral Daily   Chlorhexidine Gluconate Cloth  6 each Topical Daily   escitalopram  10 mg Oral Daily   folic acid  1 mg Oral Daily   gabapentin  300 mg Oral QHS   gentamicin cream  1 Application Topical Daily   isosorbide  mononitrate  60 mg Oral Daily   losartan  100 mg Oral Daily   metoprolol tartrate  50 mg Oral BID   acetaminophen **OR** acetaminophen, albuterol, hydrALAZINE, LORazepam, ondansetron **OR** ondansetron (ZOFRAN) IV  Assessment/ Plan:  [name] is a 72 y.o.  male with end stage renal disease on peritoneal dialysis, hypertension, GERD, coronary artery disease, gout, hyperlipidemia, atrial fibrillation, who is admitted to [facility] on 06/26/2022 for Confusion [R41.0] Acute pulmonary edema (HCC) [J81.0] Hypertensive urgency, malignant [I16.0] Altered mental status, unspecified altered mental status type [R41.82] Acute metabolic encephalopathy [G93.41]  [facility] 62kg CCPD:  9 hours 4 exchanges fills  End Stage Renal Disease:  - peritoneal dialysis tonight. Dextrose 1.5%  Hypertension with chronic kidney disease: uncontrolled on admission and concerned for hypertensive encephalopathy. Placed on nicardipine gtt on admission. Restarted home medications of isosorbide mononitrate, losartan, amlodipine and  metoprolol. Not currently on a diuretic  Anemia of chronic kidney disease: hemoglobin stable. Macrocytic.   Secondary Hyperparathyroidism: has not been taking binder   LOS: 1 Richele Strand 6/2/20242:05 PM

## 2022-06-27 NOTE — Plan of Care (Signed)
Discussed with patient plan of care for the evening, pain management and clustering care with some teach back displayed at this time.  The patient's wife gave verbal authorization for peritoneal dialysis after husband asked for his wife to give consent.  Problem: Education: Goal: Knowledge of General Education information will improve Description: Including pain rating scale, medication(s)/side effects and non-pharmacologic comfort measures Outcome: Progressing   Problem: Pain Managment: Goal: General experience of comfort will improve Outcome: Progressing

## 2022-06-27 NOTE — Progress Notes (Addendum)
[facility]  - [place] at [facility]   PATIENT NAME: Casey French    MR#:  [id number]  DATE OF BIRTH:  1951/01/01  SUBJECTIVE:  no family at bedside. Spoke with daughter on the phone. Patient overall improved. Of IV nicardipine gtt. took his BP meds. PT went well last night. Patient does not remember events from yesterday's visit to ER. Denies any headache or chest pain. Patient tells me he stopped doing PD four days ago due to headache. Did not give me the exact reason why he did not take his BP meds and or resume PD.  VITALS:  Blood pressure (!) 167/89, pulse 97, temperature 97.9 F (36.6 C), temperature source Oral, resp. rate (!) 33, height 5\' 9"  (1.753 m), weight 60.4 kg, SpO2 92 %.  PHYSICAL EXAMINATION:   GENERAL:  72 y.o.-year-old patient with no acute distress.  LUNGS: Normal breath sounds bilaterally, no wheezing CARDIOVASCULAR: S1, S2 normal. No murmur   ABDOMEN: Soft, nontender, nondistended.PD cath+ EXTREMITIES: No  edema b/l.    NEUROLOGIC: nonfocal  patient is alert and oriented times three   LABORATORY PANEL:  CBC Recent Labs  Lab 06/26/22 0209  WBC 10.6*  HGB 10.6*  HCT 34.1*  PLT 143*     Chemistries  Recent Labs  Lab 06/26/22 0209  NA 141  K 4.1  CL 110  CO2 20*  GLUCOSE 213*  BUN 26*  CREATININE 5.34*  CALCIUM 8.1*  AST 47*  ALT 25  ALKPHOS 109  BILITOT 0.9     RADIOLOGY:  CT HEAD WO CONTRAST ( )  Result Date: 06/26/2022 CLINICAL DATA:  Head trauma, minor (Age >= 65y) Possible unwitnessed fall, found on the floor. EXAM: CT HEAD WITHOUT CONTRAST TECHNIQUE: Contiguous axial images were obtained from the base of the skull through the vertex without intravenous contrast. RADIATION DOSE REDUCTION: This exam was performed according to the departmental dose-optimization program which includes automated exposure control, adjustment of the mA and/or kV according to patient size and/or use of iterative reconstruction  technique. COMPARISON:  04/18/2022 FINDINGS: Brain: No intracranial hemorrhage, mass effect, or midline shift. Normal for age atrophy. No hydrocephalus. The basilar cisterns are patent. Unchanged periventricular and deep white matter hypodensity typical of chronic small vessel ischemia. Remote right basal gangliar lacunar infarct. No evidence of territorial infarct or acute ischemia. No extra-axial or intracranial fluid collection. Vascular: Atherosclerosis of skullbase vasculature without hyperdense vessel or abnormal calcification. Skull: No fracture or focal lesion. Sinuses/Orbits: Paranasal sinuses and mastoid air cells are clear. The visualized orbits are unremarkable. Other: None. IMPRESSION: 1. No acute intracranial abnormality. No skull fracture. 2. Unchanged chronic small vessel ischemia and remote right basal gangliar lacunar infarct. Electronically Signed   By: Narda Rutherford M.D.   On: 06/26/2022 02:41   DG Chest Portable 1 View  Result Date: 06/26/2022 CLINICAL DATA:  Hypoxia. Patient was found on the floor. Unwitnessed fall. EXAM: PORTABLE CHEST 1 VIEW COMPARISON:  04/18/2022 FINDINGS: Shallow inspiration. Linear atelectasis in the right lung base. Heart size is normal for technique. Since the previous study, there is increased perihilar hazy opacities suggesting developing infiltration or edema. Possible small right pleural effusion. No pneumothorax. Mediastinal contours appear intact. Calcification of the aorta. IMPRESSION: 1. Developing hazy perihilar infiltrates possibly edema or pneumonia. 2. Linear atelectasis in the right lung base. 3. Possible small right pleural effusion. Electronically Signed   By: Burman Nieves M.D.   On: 06/26/2022 01:29    Assessment and Plan Casey French  is a 72 y.o. male with medical history significant for CAD, DM, HTN, A-fib on Eliquis, history of GI bleed and ESRD on PD who presents to the ED with altered mental status described as confusion and  combativeness that developed over the past 3 to 4 days which is unusual for him. Per family patient had stopped doing patriot dialysis and also stopped taking his medications for last four days.  Patient was combative in the ER. Had to give him IV Ativan. During my evaluation daughter was at bedside. Patient sedated unable to give any history review of systems.  Acute metabolic encephalopathy suspected due to hypertensive urgency/rebound hypertension /PRES syndrome with not taking BP meds -- patient systolic blood pressure was more than 200 persistently along with diastolic>100-110.  --Patient  on nicardipine gtt -- Resumed his home meds when able to take. -- Patient's PD will be resumed tonight. --6/2-- mentation much improved back to baseline. Blood pressure stable. Off drip. Resumed oral meds. Will adjust BP meds accordingly.  Acute hypoxic respiratory failure with possible fluid overload in the setting of not doing PD and possible pneumonia (radiological) --Patient hypoxic to 86% on room air in the setting of missed dialysis x 4 days --Chest x-ray with edema versus infiltrate as well as right pleural effusion --Received Lasix in the ED --Nephrology consult for dialysis-- per Dr. Wynelle Link PD will be started tonight --Pro calcitonin 0.19-- clinically does not seem to have pneumonia. Will discontinue antibiotics.  PAF (paroxysmal atrial fibrillation) (HCC) on chronic anticoagulation --Continue apixaban and metoprolol   ESRD on peritoneal dialysis (HCC) Anemia of CKD Nephrology consult appreciated Hemoglobin stable   CAD (coronary artery disease) No complaints of chest pain, EKG nonacute   DM type 2 (diabetes mellitus, type 2) (HCC) Sliding scale insulin coverage Sugars well-controlled. Patient not on any meds at home  Depression (Situational) --on lexapro (per PCP)  Given multiple medical problems will have palliative care see patient and discuss goals of care with patient and  family if continues to show improvement likely discharge tomorrow. Daughter aware.   Family communication : daughter on the phone Consults : nephrology CODE STATUS: full DVT Prophylaxis : heparin Level of care: Stepdown Status is: Inpatient Remains inpatient appropriate because: metabolic encephalopathy, hypertensive urgency    TOTAL TIME TAKING CARE OF THIS PATIENT: 35 minutes.  >50% time spent on counselling and coordination of care  Note: This dictation was prepared with Dragon dictation along with smaller phrase technology. Any transcriptional errors that result from this process are unintentional.  Enedina Finner M.D    [facility]   CC: Primary care physician; Jerl Mina, MD

## 2022-06-27 NOTE — Progress Notes (Signed)
SLP Cancellation Note  Patient Details Name: Casey French MRN: [id number] DOB: 1950-09-23   Cancelled treatment:       Reason Eval/Treat Not Completed: SLP screened, no needs identified, will sign off (Per discussion with RN and chart review, pt with marked improvement in mentation after PD. Pt has since been started on a diet and has no concerns for oropharyngeal dysphagia.)  Casey French, M.S., CCC-SLP Speech-Language Pathologist [facility] [phone] (ASCOM)  Woodroe Chen 06/27/2022, 8:40 AM

## 2022-06-28 DIAGNOSIS — D631 Anemia in chronic kidney disease: Secondary | ICD-10-CM

## 2022-06-28 DIAGNOSIS — N186 End stage renal disease: Secondary | ICD-10-CM | POA: Diagnosis not present

## 2022-06-28 DIAGNOSIS — I160 Hypertensive urgency: Secondary | ICD-10-CM | POA: Diagnosis not present

## 2022-06-28 DIAGNOSIS — J810 Acute pulmonary edema: Secondary | ICD-10-CM | POA: Diagnosis not present

## 2022-06-28 DIAGNOSIS — G9341 Metabolic encephalopathy: Secondary | ICD-10-CM | POA: Diagnosis not present

## 2022-06-28 LAB — CBC WITH DIFFERENTIAL/PLATELET
Abs Immature Granulocytes: 0.04 10*3/uL (ref 0.00–0.07)
Basophils Absolute: 0 10*3/uL (ref 0.0–0.1)
Basophils Relative: 0 %
Eosinophils Absolute: 0.1 10*3/uL (ref 0.0–0.5)
Eosinophils Relative: 1 %
HCT: 28.4 % — ABNORMAL LOW (ref 39.0–52.0)
Hemoglobin: 9.2 g/dL — ABNORMAL LOW (ref 13.0–17.0)
Immature Granulocytes: 1 %
Lymphocytes Relative: 12 %
Lymphs Abs: 0.9 10*3/uL (ref 0.7–4.0)
MCH: 31.3 pg (ref 26.0–34.0)
MCHC: 32.4 g/dL (ref 30.0–36.0)
MCV: 96.6 fL (ref 80.0–100.0)
Monocytes Absolute: 0.8 10*3/uL (ref 0.1–1.0)
Monocytes Relative: 11 %
Neutro Abs: 5.3 10*3/uL (ref 1.7–7.7)
Neutrophils Relative %: 75 %
Platelets: 112 10*3/uL — ABNORMAL LOW (ref 150–400)
RBC: 2.94 MIL/uL — ABNORMAL LOW (ref 4.22–5.81)
RDW: 15 % (ref 11.5–15.5)
WBC: 7.2 10*3/uL (ref 4.0–10.5)
nRBC: 0 % (ref 0.0–0.2)

## 2022-06-28 LAB — RENAL FUNCTION PANEL
Albumin: 2.2 g/dL — ABNORMAL LOW (ref 3.5–5.0)
Anion gap: 6 (ref 5–15)
BUN: 26 mg/dL — ABNORMAL HIGH (ref 8–23)
CO2: 24 mmol/L (ref 22–32)
Calcium: 7.8 mg/dL — ABNORMAL LOW (ref 8.9–10.3)
Chloride: 109 mmol/L (ref 98–111)
Creatinine, Ser: 5.07 mg/dL — ABNORMAL HIGH (ref 0.61–1.24)
GFR, Estimated: 11 mL/min — ABNORMAL LOW (ref >60)
Glucose, Bld: 104 mg/dL — ABNORMAL HIGH (ref 70–99)
Phosphorus: 3.7 mg/dL (ref 2.5–4.6)
Potassium: 3 mmol/L — ABNORMAL LOW (ref 3.5–5.1)
Sodium: 139 mmol/L (ref 135–145)

## 2022-06-28 MED ORDER — POTASSIUM CHLORIDE CRYS ER 20 MEQ PO TBCR
20.0000 meq | EXTENDED_RELEASE_TABLET | Freq: Once | ORAL | Status: AC
  Administered 2022-06-28: 20 meq via ORAL
  Filled 2022-06-28: qty 1

## 2022-06-28 MED ORDER — LOSARTAN POTASSIUM 100 MG PO TABS: 100.0000 mg | ORAL_TABLET | Freq: Every day | ORAL | 1 refills | Status: DC

## 2022-06-28 MED ORDER — AMLODIPINE BESYLATE 10 MG PO TABS: 10.0000 mg | ORAL_TABLET | Freq: Every day | ORAL | 1 refills | Status: DC

## 2022-06-28 NOTE — Progress Notes (Signed)
Discharge instructions reviewed with patient including followup visits and new medications.  Understanding was verbalized and all questions were answered.  IV removed without complication; patient tolerated well.  Patient awaiting ride who is bringing his clothes for discharge.

## 2022-06-28 NOTE — TOC Transition Note (Signed)
Transition of Care [facility]) - CM/SW Discharge Note   Patient Details  Name: Casey French MRN: [id number] Date of Birth: [birth year]  Transition of Care [facility]) CM/SW Contact:  Margarito Liner, LCSW Phone Number: 06/28/2022, 10:16 AM   Clinical Narrative:   Patient has orders to discharge home today. Readmission prevention screen complete. CSW met with patient. No supports at bedside. CSW introduced role and explained that discharge planning would be discussed. PCP is Jerl Mina, MD. Patient drives himself to appointments. Pharmacy is [facility] near [facility]. No issues obtaining medications. Patient lives home with his wife, 4 cats, and one dog. No home health or DME use prior to admission. His wife will pick him up today. No further concerns. CSW signing off.  Final next level of care: Home/Self Care Barriers to Discharge: No Barriers Identified   Patient Goals and CMS Choice      Discharge Placement                  Patient to be transferred to facility by: Wife   Patient and family notified of of transfer: 06/28/22  Discharge Plan and Services Additional resources added to the After Visit Summary for                                       Social Determinants of Health (SDOH) Interventions SDOH Screenings   Food Insecurity: No Food Insecurity (06/27/2022)  Housing: Low Risk  (06/27/2022)  Transportation Needs: No Transportation Needs (06/27/2022)  Utilities: Not At Risk (06/27/2022)  Tobacco Use: Medium Risk (06/26/2022)     Readmission Risk Interventions    06/28/2022   10:16 AM 03/10/2021    9:22 AM 04/24/2020    8:02 AM  Readmission Risk Prevention Plan  Transportation Screening Complete Complete Complete  PCP or Specialist Appt within 3-5 Days Complete Complete Complete  HRI or Home Care Consult   Complete  Social Work Consult for Recovery Care Planning/Counseling Complete Complete Complete  Palliative Care Screening Not Applicable Not Applicable Not  Applicable  Medication Review Oceanographer) Complete Complete Complete

## 2022-06-28 NOTE — Discharge Instructions (Addendum)
Keep log of BP at home Resume PD as before

## 2022-06-28 NOTE — Discharge Summary (Signed)
Physician Discharge Summary   Patient: Casey French MRN: [id number] DOB: 22-Aug-1950  Admit date:     06/26/2022  Discharge date: 06/28/22  Discharge Physician: Enedina Finner   PCP: Jerl Mina, MD   Recommendations at discharge:    F/u PCP in 1-2 weeks Keep log of BP at home F/.u Dr Wynelle Link in 1-2 weeks  Discharge Diagnoses: Principal Problem:   CAP (community acquired pneumonia) Active Problems:   Acute metabolic encephalopathy   Acute respiratory failure with hypoxia (HCC)   Hypertensive urgency   ESRD on peritoneal dialysis (HCC)   PAF (paroxysmal atrial fibrillation) (HCC) on chronic anticoagulation   Anemia in chronic kidney disease   DM type 2 (diabetes mellitus, type 2) (HCC)   CAD (coronary artery disease)   Hypertensive urgency, malignant   Acute pulmonary edema (HCC)   Casey French is a 72 y.o. male with medical history significant for CAD, DM, HTN, A-fib on Eliquis, history of GI bleed and ESRD on PD who presents to the ED with altered mental status described as confusion and combativeness that developed over the past 3 to 4 days which is unusual for him. Per family patient had stopped doing patriot dialysis and also stopped taking his medications for last four days.   Patient was combative in the ER. Had to give him IV Ativan. During my evaluation daughter was at bedside. Patient sedated unable to give any history review of systems.   Acute metabolic encephalopathy suspected due to hypertensive urgency/rebound hypertension /PRES syndrome with not taking BP meds -- patient systolic blood pressure was more than 200 persistently along with diastolic>100-110.  --Patient  on nicardipine gtt -- Resumed his home meds when able to take. -- Patient's PD will be resumed tonight. --6/2-- mentation much improved back to baseline. Blood pressure stable. Off drip. Resumed oral meds. Will adjust BP meds accordingly. --6/3--bp fairly well controlled. Cont present  meds. Pt denies any symptoms   Acute hypoxic respiratory failure with possible fluid overload in the setting of not doing PD and possible pneumonia (radiological) --Patient hypoxic to 86% on room air in the setting of missed dialysis x 4 days --Chest x-ray with edema versus infiltrate as well as right pleural effusion --Received Lasix in the ED --Nephrology consult for dialysis-- per Dr. Wynelle Link PD will be started tonight --Pro calcitonin 0.19-- clinically does not seem to have pneumonia. Will discontinue antibiotics.   PAF (paroxysmal atrial fibrillation) (HCC) on chronic anticoagulation --Continue apixaban and metoprolol   ESRD on peritoneal dialysis (HCC) Anemia of CKD Nephrology consult appreciated--Tolerating PD well Hemoglobin stable   CAD (coronary artery disease) stable   DM type 2 (diabetes mellitus, type 2) (HCC) Sliding scale insulin coverage Sugars well-controlled. Patient not on any meds at home   Depression (Situational) --on lexapro (per PCP)   Given multiple medical problems will have palliative care see patient and discuss goals of care with patient and family Overall improved. D/c home. Dr Wynelle Link aware    Family communication : left VM for Nicole,daughter on the phone Consults : nephrology CODE STATUS: full DVT Prophylaxis : heparin    Diet recommendation:  Discharge Diet Orders (From admission, onward)     Start     Ordered   06/28/22 0000  Diet - low sodium heart healthy        06/28/22 0848            DISCHARGE MEDICATION: Allergies as of 06/28/2022       Reactions  Morphine Anxiety   agitation        Medication List     TAKE these medications    allopurinol 100 MG tablet Commonly known as: ZYLOPRIM Take 100 mg by mouth daily.   amLODipine 10 MG tablet Commonly known as: NORVASC Take 1 tablet (10 mg total) by mouth daily.   apixaban 2.5 MG Tabs tablet Commonly known as: ELIQUIS Take 2.5 mg by mouth 2 (two) times daily.    aspirin EC 81 MG tablet Take 81 mg by mouth daily.   calcitRIOL 0.25 MCG capsule Commonly known as: ROCALTROL Take 1 capsule (0.25 mcg total) by mouth daily.   escitalopram 10 MG tablet Commonly known as: LEXAPRO Take 10 mg by mouth daily.   folic acid 1 MG tablet Commonly known as: FOLVITE TAKE 2 TABLETS(2 MG) BY MOUTH DAILY What changed: See the new instructions.   gabapentin 300 MG capsule Commonly known as: NEURONTIN Take 300 mg by mouth at bedtime.   hydrocortisone 2.5 % rectal cream Commonly known as: ANUSOL-HC Place 1 application rectally 2 (two) times daily as needed for hemorrhoids or anal itching.   isosorbide mononitrate 60 MG 24 hr tablet Commonly known as: IMDUR Take 60 mg by mouth daily.   losartan 100 MG tablet Commonly known as: COZAAR Take 1 tablet (100 mg total) by mouth daily.   meclizine 25 MG tablet Commonly known as: ANTIVERT Take 1 tablet (25 mg total) by mouth 2 (two) times daily.   methocarbamol 500 MG tablet Commonly known as: ROBAXIN Take 500 mg by mouth 3 (three) times daily as needed.   metoprolol succinate 100 MG 24 hr tablet Commonly known as: TOPROL-XL Take 100 mg by mouth daily. Take with or immediately following a meal.   nitroGLYCERIN 0.4 MG SL tablet Commonly known as: NITROSTAT Place 0.4 mg under the tongue every 5 (five) minutes as needed for chest pain.   omeprazole 20 MG capsule Commonly known as: PRILOSEC Take 20 mg by mouth daily.   psyllium 58.6 % packet Commonly known as: METAMUCIL Take 1 packet by mouth 2 (two) times daily.   rosuvastatin 40 MG tablet Commonly known as: CRESTOR Take 40 mg by mouth daily.   Vitamin D (Ergocalciferol) 1.25 MG (50000 UNIT) Caps capsule Commonly known as: DRISDOL Take 50,000 Units by mouth once a week.               Discharge Care Instructions  (From admission, onward)           Start     Ordered   06/28/22 0000  Discharge wound care:       Comments: Comments:  Clean skin near exit site with chloraprep swab sticks.  Starting at catheter, use circular pattern around exit site, moving towards outer edges of area covered by dressing.  Apply gentamicin cream to site once daily.  Cover with dry dressing.  06/26/22 1503   06/28/22 0848            Follow-up Information     Jerl Mina, MD. Schedule an appointment as soon as possible for a visit in 1 week(s).   Specialty: Family Medicine Why: hospital f/u Contact information: [address] [place] [address] [phone]         Lamont Dowdy, MD Follow up in 10 day(s).   Specialty: Nephrology Why: ESRD and HTN f/u Contact information: [address] [employer] [address] [place] [address] [phone]  Discharge Exam: Filed Weights   06/27/22 0500 06/27/22 2020 06/27/22 2040  Weight: 60.4 kg 60.8 kg 68.6 kg  GENERAL:  72 y.o.-year-old patient with no acute distress.  LUNGS: Normal breath sounds bilaterally, no wheezing CARDIOVASCULAR: S1, S2 normal. No murmur   ABDOMEN: Soft, nontender, nondistended.PD cath+ EXTREMITIES: No  edema b/l.    NEUROLOGIC: nonfocal  patient is alert and oriented times three   Condition at discharge: fair  The results of significant diagnostics from this hospitalization (including imaging, microbiology, ancillary and laboratory) are listed below for reference.   Imaging Studies: CT HEAD WO CONTRAST ( )  Result Date: 06/26/2022 CLINICAL DATA:  Head trauma, minor (Age >= 65y) Possible unwitnessed fall, found on the floor. EXAM: CT HEAD WITHOUT CONTRAST TECHNIQUE: Contiguous axial images were obtained from the base of the skull through the vertex without intravenous contrast. RADIATION DOSE REDUCTION: This exam was performed according to the departmental dose-optimization program which includes automated exposure control, adjustment of the mA and/or kV according to patient size and/or use of iterative reconstruction technique.  COMPARISON:  04/18/2022 FINDINGS: Brain: No intracranial hemorrhage, mass effect, or midline shift. Normal for age atrophy. No hydrocephalus. The basilar cisterns are patent. Unchanged periventricular and deep white matter hypodensity typical of chronic small vessel ischemia. Remote right basal gangliar lacunar infarct. No evidence of territorial infarct or acute ischemia. No extra-axial or intracranial fluid collection. Vascular: Atherosclerosis of skullbase vasculature without hyperdense vessel or abnormal calcification. Skull: No fracture or focal lesion. Sinuses/Orbits: Paranasal sinuses and mastoid air cells are clear. The visualized orbits are unremarkable. Other: None. IMPRESSION: 1. No acute intracranial abnormality. No skull fracture. 2. Unchanged chronic small vessel ischemia and remote right basal gangliar lacunar infarct. Electronically Signed   By: Narda Rutherford M.D.   On: 06/26/2022 02:41   DG Chest Portable 1 View  Result Date: 06/26/2022 CLINICAL DATA:  Hypoxia. Patient was found on the floor. Unwitnessed fall. EXAM: PORTABLE CHEST 1 VIEW COMPARISON:  04/18/2022 FINDINGS: Shallow inspiration. Linear atelectasis in the right lung base. Heart size is normal for technique. Since the previous study, there is increased perihilar hazy opacities suggesting developing infiltration or edema. Possible small right pleural effusion. No pneumothorax. Mediastinal contours appear intact. Calcification of the aorta. IMPRESSION: 1. Developing hazy perihilar infiltrates possibly edema or pneumonia. 2. Linear atelectasis in the right lung base. 3. Possible small right pleural effusion. Electronically Signed   By: Burman Nieves M.D.   On: 06/26/2022 01:29    Microbiology: Results for orders placed or performed during the hospital encounter of 06/26/22  SARS Coronavirus 2 by RT PCR (hospital order, performed in [facility] hospital lab) *cepheid single result test* Anterior Nasal Swab     Status: None    Collection Time: 06/26/22  5:13 PM   Specimen: Anterior Nasal Swab  Result Value Ref Range Status   SARS Coronavirus 2 by RT PCR NEGATIVE NEGATIVE Final    Comment: (NOTE) SARS-CoV-2 target nucleic acids are NOT DETECTED.  The SARS-CoV-2 RNA is generally detectable in upper and lower respiratory specimens during the acute phase of infection. The lowest concentration of SARS-CoV-2 viral copies this assay can detect is 250 copies / mL. A negative result does not preclude SARS-CoV-2 infection and should not be used as the sole basis for treatment or other patient management decisions.  A negative result may occur with improper specimen collection / handling, submission of specimen other than nasopharyngeal swab, presence of viral mutation(s) within the areas targeted by this assay,  and inadequate number of viral copies (<250 copies / mL). A negative result must be combined with clinical observations, patient history, and epidemiological information.  Fact Sheet for Patients:   RoadLapTop.co.za  Fact Sheet for Healthcare Providers: http://kim-miller.com/  This test is not yet approved or  cleared by the [place] FDA and has been authorized for detection and/or diagnosis of SARS-CoV-2 by FDA under an Emergency Use Authorization (EUA).  This EUA will remain in effect (meaning this test can be used) for the duration of the COVID-19 declaration under Section 564(b)(1) of the Act, 21 U.S.C. section 360bbb-3(b)(1), unless the authorization is terminated or revoked sooner.  Performed at [facility], [address] [place], [place] [address]   MRSA Next Gen by PCR, Nasal     Status: None   Collection Time: 06/26/22  5:31 PM   Specimen: Nasal Mucosa; Nasal Swab  Result Value Ref Range Status   MRSA by PCR Next Gen NOT DETECTED NOT DETECTED Final    Comment: (NOTE) The GeneXpert MRSA Assay (FDA approved for NASAL specimens  only), is one component of a comprehensive MRSA colonization surveillance program. It is not intended to diagnose MRSA infection nor to guide or monitor treatment for MRSA infections. Test performance is not FDA approved in patients less than 91 years old. Performed at [facility], [address] [place], [place] [address]     Labs: CBC: Recent Labs  Lab 06/26/22 0209 06/28/22 0644  WBC 10.6* 7.2  NEUTROABS  --  5.3  HGB 10.6* 9.2*  HCT 34.1* 28.4*  MCV 100.6* 96.6  PLT 143* 112*   Basic Metabolic Panel: Recent Labs  Lab 06/26/22 0209 06/28/22 0644  NA 141 139  K 4.1 3.0*  CL 110 109  CO2 20* 24  GLUCOSE 213* 104*  BUN 26* 26*  CREATININE 5.34* 5.07*  CALCIUM 8.1* 7.8*  PHOS  --  3.7   Liver Function Tests: Recent Labs  Lab 06/26/22 0209 06/28/22 0644  AST 47*  --   ALT 25  --   ALKPHOS 109  --   BILITOT 0.9  --   PROT 6.2*  --   ALBUMIN 2.9* 2.2*   CBG: Recent Labs  Lab 06/26/22 1735  GLUCAP 116*    Discharge time spent: greater than 30 minutes.  Signed: Enedina Finner, MD [facility] 06/28/2022

## 2022-06-28 NOTE — Discharge Planning (Signed)
ESTABLISHED PERITONE[facility] DIALYSIS Outpatient Facility [facility] [address] [place], [place] [address] [phone] 

## 2022-09-26 DIAGNOSIS — K5100 Ulcerative (chronic) pancolitis without complications: Secondary | ICD-10-CM

## 2022-09-26 HISTORY — DX: Ulcerative (chronic) pancolitis without complications: K51.00

## 2022-10-07 ENCOUNTER — Inpatient Hospital Stay
Admission: EM | Admit: 2022-10-07 | Discharge: 2022-10-11 | DRG: 391 | Disposition: A | Discharge status: Home / Self Care | Payer: Medicare PPO | Attending: Internal Medicine | Admitting: Internal Medicine

## 2022-10-07 ENCOUNTER — Other Ambulatory Visit: Payer: Self-pay

## 2022-10-07 ENCOUNTER — Emergency Department: Payer: Medicare PPO

## 2022-10-07 DIAGNOSIS — Z96642 Presence of left artificial hip joint: Secondary | ICD-10-CM | POA: Diagnosis present

## 2022-10-07 DIAGNOSIS — Z7982 Long term (current) use of aspirin: Secondary | ICD-10-CM

## 2022-10-07 DIAGNOSIS — E119 Type 2 diabetes mellitus without complications: Secondary | ICD-10-CM

## 2022-10-07 DIAGNOSIS — Z955 Presence of coronary angioplasty implant and graft: Secondary | ICD-10-CM | POA: Diagnosis not present

## 2022-10-07 DIAGNOSIS — I480 Paroxysmal atrial fibrillation: Secondary | ICD-10-CM | POA: Diagnosis present

## 2022-10-07 DIAGNOSIS — Z87891 Personal history of nicotine dependence: Secondary | ICD-10-CM

## 2022-10-07 DIAGNOSIS — Z8249 Family history of ischemic heart disease and other diseases of the circulatory system: Secondary | ICD-10-CM

## 2022-10-07 DIAGNOSIS — E876 Hypokalemia: Secondary | ICD-10-CM | POA: Diagnosis present

## 2022-10-07 DIAGNOSIS — I2510 Atherosclerotic heart disease of native coronary artery without angina pectoris: Secondary | ICD-10-CM | POA: Diagnosis present

## 2022-10-07 DIAGNOSIS — N2581 Secondary hyperparathyroidism of renal origin: Secondary | ICD-10-CM | POA: Diagnosis present

## 2022-10-07 DIAGNOSIS — Z885 Allergy status to narcotic agent status: Secondary | ICD-10-CM

## 2022-10-07 DIAGNOSIS — E8721 Acute metabolic acidosis: Secondary | ICD-10-CM | POA: Diagnosis present

## 2022-10-07 DIAGNOSIS — E1122 Type 2 diabetes mellitus with diabetic chronic kidney disease: Secondary | ICD-10-CM | POA: Diagnosis present

## 2022-10-07 DIAGNOSIS — Z91158 Patient's noncompliance with renal dialysis for other reason: Secondary | ICD-10-CM

## 2022-10-07 DIAGNOSIS — Z992 Dependence on renal dialysis: Secondary | ICD-10-CM | POA: Diagnosis not present

## 2022-10-07 DIAGNOSIS — Z79899 Other long term (current) drug therapy: Secondary | ICD-10-CM | POA: Diagnosis not present

## 2022-10-07 DIAGNOSIS — E7800 Pure hypercholesterolemia, unspecified: Secondary | ICD-10-CM | POA: Diagnosis present

## 2022-10-07 DIAGNOSIS — R079 Chest pain, unspecified: Secondary | ICD-10-CM | POA: Diagnosis present

## 2022-10-07 DIAGNOSIS — I252 Old myocardial infarction: Secondary | ICD-10-CM

## 2022-10-07 DIAGNOSIS — D631 Anemia in chronic kidney disease: Secondary | ICD-10-CM | POA: Diagnosis present

## 2022-10-07 DIAGNOSIS — K529 Noninfective gastroenteritis and colitis, unspecified: Principal | ICD-10-CM

## 2022-10-07 DIAGNOSIS — N186 End stage renal disease: Secondary | ICD-10-CM

## 2022-10-07 DIAGNOSIS — E8729 Other acidosis: Secondary | ICD-10-CM

## 2022-10-07 DIAGNOSIS — Z7901 Long term (current) use of anticoagulants: Secondary | ICD-10-CM

## 2022-10-07 DIAGNOSIS — I160 Hypertensive urgency: Secondary | ICD-10-CM | POA: Diagnosis present

## 2022-10-07 DIAGNOSIS — I120 Hypertensive chronic kidney disease with stage 5 chronic kidney disease or end stage renal disease: Secondary | ICD-10-CM | POA: Diagnosis present

## 2022-10-07 DIAGNOSIS — Z1152 Encounter for screening for COVID-19: Secondary | ICD-10-CM | POA: Diagnosis not present

## 2022-10-07 DIAGNOSIS — R7989 Other specified abnormal findings of blood chemistry: Secondary | ICD-10-CM

## 2022-10-07 DIAGNOSIS — Z9841 Cataract extraction status, right eye: Secondary | ICD-10-CM

## 2022-10-07 LAB — COMPREHENSIVE METABOLIC PANEL
ALT: 25 U/L (ref 0–44)
AST: 34 U/L (ref 15–41)
Albumin: 3.1 g/dL — ABNORMAL LOW (ref 3.5–5.0)
Alkaline Phosphatase: 90 U/L (ref 38–126)
Anion gap: 16 — ABNORMAL HIGH (ref 5–15)
BUN: 42 mg/dL — ABNORMAL HIGH (ref 8–23)
CO2: 15 mmol/L — ABNORMAL LOW (ref 22–32)
Calcium: 8.6 mg/dL — ABNORMAL LOW (ref 8.9–10.3)
Chloride: 108 mmol/L (ref 98–111)
Creatinine, Ser: 6.83 mg/dL — ABNORMAL HIGH (ref 0.61–1.24)
GFR, Estimated: 8 mL/min — ABNORMAL LOW (ref >60)
Glucose, Bld: 143 mg/dL — ABNORMAL HIGH (ref 70–99)
Potassium: 3.9 mmol/L (ref 3.5–5.1)
Sodium: 139 mmol/L (ref 135–145)
Total Bilirubin: 0.9 mg/dL (ref 0.3–1.2)
Total Protein: 6.1 g/dL — ABNORMAL LOW (ref 6.5–8.1)

## 2022-10-07 LAB — CBC WITH DIFFERENTIAL/PLATELET
Abs Immature Granulocytes: 0.05 10*3/uL (ref 0.00–0.07)
Basophils Absolute: 0 10*3/uL (ref 0.0–0.1)
Basophils Relative: 0 %
Eosinophils Absolute: 0 10*3/uL (ref 0.0–0.5)
Eosinophils Relative: 0 %
HCT: 44.2 % (ref 39.0–52.0)
Hemoglobin: 14.3 g/dL (ref 13.0–17.0)
Immature Granulocytes: 1 %
Lymphocytes Relative: 3 %
Lymphs Abs: 0.3 10*3/uL — ABNORMAL LOW (ref 0.7–4.0)
MCH: 29.7 pg (ref 26.0–34.0)
MCHC: 32.4 g/dL (ref 30.0–36.0)
MCV: 91.9 fL (ref 80.0–100.0)
Monocytes Absolute: 0.3 10*3/uL (ref 0.1–1.0)
Monocytes Relative: 3 %
Neutro Abs: 8.7 10*3/uL — ABNORMAL HIGH (ref 1.7–7.7)
Neutrophils Relative %: 93 %
Platelets: 168 10*3/uL (ref 150–400)
RBC: 4.81 MIL/uL (ref 4.22–5.81)
RDW: 15.8 % — ABNORMAL HIGH (ref 11.5–15.5)
WBC: 9.4 10*3/uL (ref 4.0–10.5)
nRBC: 0 % (ref 0.0–0.2)

## 2022-10-07 LAB — URINALYSIS, ROUTINE W REFLEX MICROSCOPIC
Bacteria, UA: NONE SEEN
Bilirubin Urine: NEGATIVE
Glucose, UA: >=500 mg/dL — AB
Ketones, ur: 5 mg/dL — AB
Leukocytes,Ua: NEGATIVE
Nitrite: NEGATIVE
Protein, ur: >=300 mg/dL — AB
Specific Gravity, Urine: 1.012 (ref 1.005–1.030)
Squamous Epithelial / HPF: 0 /HPF (ref 0–5)
pH: 8 (ref 5.0–8.0)

## 2022-10-07 LAB — BLOOD GAS, VENOUS
Acid-base deficit: 5.1 mmol/L — ABNORMAL HIGH (ref 0.0–2.0)
Bicarbonate: 18.8 mmol/L — ABNORMAL LOW (ref 20.0–28.0)
O2 Saturation: 94.4 %
Patient temperature: 37
pCO2, Ven: 31 mmHg — ABNORMAL LOW (ref 44–60)
pH, Ven: 7.39 (ref 7.25–7.43)
pO2, Ven: 64 mmHg — ABNORMAL HIGH (ref 32–45)

## 2022-10-07 LAB — TROPONIN I (HIGH SENSITIVITY)
Troponin I (High Sensitivity): 39 ng/L — ABNORMAL HIGH (ref <18)
Troponin I (High Sensitivity): 49 ng/L — ABNORMAL HIGH (ref <18)

## 2022-10-07 LAB — BETA-HYDROXYBUTYRIC ACID: Beta-Hydroxybutyric Acid: 1.49 mmol/L — ABNORMAL HIGH (ref 0.05–0.27)

## 2022-10-07 LAB — GLUCOSE, CAPILLARY: Glucose-Capillary: 101 mg/dL — ABNORMAL HIGH (ref 70–99)

## 2022-10-07 LAB — MAGNESIUM: Magnesium: 1.8 mg/dL (ref 1.7–2.4)

## 2022-10-07 LAB — LACTIC ACID, PLASMA: Lactic Acid, Venous: 1.6 mmol/L (ref 0.5–1.9)

## 2022-10-07 MED ORDER — SODIUM BICARBONATE 8.4 % IV SOLN
Freq: Once | INTRAVENOUS | Status: DC
  Filled 2022-10-07: qty 50

## 2022-10-07 MED ORDER — ACETAMINOPHEN 325 MG PO TABS
650.0000 mg | ORAL_TABLET | Freq: Four times a day (QID) | ORAL | Status: DC | PRN
  Administered 2022-10-07 – 2022-10-09 (×3): 650 mg via ORAL
  Filled 2022-10-07 (×3): qty 2

## 2022-10-07 MED ORDER — SODIUM CHLORIDE 0.9 % IV BOLUS
500.0000 mL | Freq: Once | INTRAVENOUS | Status: AC
  Administered 2022-10-07: 500 mL via INTRAVENOUS

## 2022-10-07 MED ORDER — INSULIN ASPART 100 UNIT/ML IJ SOLN: 0.0000 [IU] | Freq: Three times a day (TID) | INTRAMUSCULAR | Status: DC

## 2022-10-07 MED ORDER — ASPIRIN 81 MG PO CHEW
324.0000 mg | CHEWABLE_TABLET | Freq: Once | ORAL | Status: AC
  Administered 2022-10-07: 324 mg via ORAL
  Filled 2022-10-07: qty 4

## 2022-10-07 MED ORDER — APIXABAN 2.5 MG PO TABS
2.5000 mg | ORAL_TABLET | Freq: Two times a day (BID) | ORAL | Status: DC
  Administered 2022-10-07 – 2022-10-11 (×8): 2.5 mg via ORAL
  Filled 2022-10-07 (×8): qty 1

## 2022-10-07 MED ORDER — STERILE WATER FOR INJECTION IV SOLN
INTRAVENOUS | Status: DC
  Filled 2022-10-07: qty 1000
  Filled 2022-10-07: qty 150

## 2022-10-07 MED ORDER — PANTOPRAZOLE SODIUM 40 MG PO TBEC
40.0000 mg | DELAYED_RELEASE_TABLET | Freq: Every day | ORAL | Status: DC
  Administered 2022-10-08 – 2022-10-11 (×4): 40 mg via ORAL
  Filled 2022-10-07 (×4): qty 1

## 2022-10-07 MED ORDER — AMLODIPINE BESYLATE 10 MG PO TABS
10.0000 mg | ORAL_TABLET | Freq: Every day | ORAL | Status: DC
  Administered 2022-10-08 – 2022-10-11 (×4): 10 mg via ORAL
  Filled 2022-10-07: qty 1
  Filled 2022-10-07: qty 2
  Filled 2022-10-07 (×2): qty 1

## 2022-10-07 MED ORDER — HYDROMORPHONE HCL 1 MG/ML IJ SOLN: 0.5000 mg | INTRAMUSCULAR | Status: DC | PRN

## 2022-10-07 MED ORDER — ONDANSETRON HCL 4 MG/2ML IJ SOLN
4.0000 mg | Freq: Four times a day (QID) | INTRAMUSCULAR | Status: DC | PRN
  Administered 2022-10-09 – 2022-10-11 (×3): 4 mg via INTRAVENOUS
  Filled 2022-10-07 (×3): qty 2

## 2022-10-07 MED ORDER — FUROSEMIDE 10 MG/ML IJ SOLN
120.0000 mg | Freq: Once | INTRAVENOUS | Status: AC
  Administered 2022-10-07: 120 mg via INTRAVENOUS
  Filled 2022-10-07: qty 10

## 2022-10-07 MED ORDER — ISOSORBIDE MONONITRATE ER 30 MG PO TB24
60.0000 mg | ORAL_TABLET | Freq: Every day | ORAL | Status: DC
  Administered 2022-10-08 – 2022-10-11 (×4): 60 mg via ORAL
  Filled 2022-10-07: qty 1
  Filled 2022-10-07: qty 2
  Filled 2022-10-07: qty 1
  Filled 2022-10-07: qty 2

## 2022-10-07 MED ORDER — ONDANSETRON HCL 4 MG PO TABS
4.0000 mg | ORAL_TABLET | Freq: Four times a day (QID) | ORAL | Status: DC | PRN
  Administered 2022-10-09: 4 mg via ORAL
  Filled 2022-10-07: qty 1

## 2022-10-07 MED ORDER — ONDANSETRON HCL 4 MG/2ML IJ SOLN
4.0000 mg | Freq: Once | INTRAMUSCULAR | Status: AC
  Administered 2022-10-07: 4 mg via INTRAVENOUS
  Filled 2022-10-07: qty 2

## 2022-10-07 MED ORDER — METRONIDAZOLE 500 MG/100ML IV SOLN
500.0000 mg | Freq: Two times a day (BID) | INTRAVENOUS | Status: DC
  Administered 2022-10-08 – 2022-10-10 (×7): 500 mg via INTRAVENOUS
  Filled 2022-10-07 (×8): qty 100

## 2022-10-07 MED ORDER — LOSARTAN POTASSIUM 50 MG PO TABS
100.0000 mg | ORAL_TABLET | Freq: Every day | ORAL | Status: DC
  Administered 2022-10-08 – 2022-10-11 (×4): 100 mg via ORAL
  Filled 2022-10-07 (×4): qty 2

## 2022-10-07 MED ORDER — SODIUM CHLORIDE 0.9 % IV SOLN
2.0000 g | INTRAVENOUS | Status: DC
  Administered 2022-10-08 – 2022-10-10 (×4): 2 g via INTRAVENOUS
  Filled 2022-10-07 (×4): qty 20

## 2022-10-07 MED ORDER — CALCITRIOL 0.25 MCG PO CAPS
0.2500 ug | ORAL_CAPSULE | Freq: Every day | ORAL | Status: DC
  Administered 2022-10-08 – 2022-10-11 (×4): 0.25 ug via ORAL
  Filled 2022-10-07 (×4): qty 1

## 2022-10-07 MED ORDER — NITROGLYCERIN 2 % TD OINT
1.0000 [in_us] | TOPICAL_OINTMENT | Freq: Four times a day (QID) | TRANSDERMAL | Status: DC
  Administered 2022-10-07 – 2022-10-08 (×3): 1 [in_us] via TOPICAL
  Filled 2022-10-07 (×3): qty 1

## 2022-10-07 MED ORDER — METOPROLOL SUCCINATE ER 100 MG PO TB24
100.0000 mg | ORAL_TABLET | Freq: Every day | ORAL | Status: DC
  Administered 2022-10-08 – 2022-10-11 (×4): 100 mg via ORAL
  Filled 2022-10-07 (×3): qty 1
  Filled 2022-10-07: qty 2

## 2022-10-07 MED ORDER — ACETAMINOPHEN 650 MG RE SUPP: 650.0000 mg | Freq: Four times a day (QID) | RECTAL | Status: DC | PRN

## 2022-10-07 MED ORDER — SODIUM BICARBONATE 8.4 % IV SOLN
Freq: Once | INTRAVENOUS | Status: AC
  Filled 2022-10-07: qty 50

## 2022-10-07 MED ORDER — IOHEXOL 300 MG/ML  SOLN
100.0000 mL | Freq: Once | INTRAMUSCULAR | Status: AC | PRN
  Administered 2022-10-07: 100 mL via INTRAVENOUS

## 2022-10-07 MED ORDER — ESCITALOPRAM OXALATE 10 MG PO TABS
10.0000 mg | ORAL_TABLET | Freq: Every day | ORAL | Status: DC
  Administered 2022-10-08 – 2022-10-11 (×4): 10 mg via ORAL
  Filled 2022-10-07 (×4): qty 1

## 2022-10-07 MED ORDER — NITROGLYCERIN 0.4 MG SL SUBL: 0.4000 mg | SUBLINGUAL_TABLET | SUBLINGUAL | Status: DC | PRN

## 2022-10-07 MED ORDER — INSULIN ASPART 100 UNIT/ML IJ SOLN: 0.0000 [IU] | Freq: Every day | INTRAMUSCULAR | Status: DC

## 2022-10-07 MED ORDER — ROSUVASTATIN CALCIUM 10 MG PO TABS
40.0000 mg | ORAL_TABLET | Freq: Every day | ORAL | Status: DC
  Administered 2022-10-08 – 2022-10-11 (×4): 40 mg via ORAL
  Filled 2022-10-07 (×2): qty 4
  Filled 2022-10-07: qty 2
  Filled 2022-10-07: qty 4

## 2022-10-07 NOTE — Assessment & Plan Note (Addendum)
History of STEMI with RCA stent 2003, LAD stent 2017 Elevated troponin Chest pain  Patient was seen by his cardiologist, Dr. Darrold Junker the day prior on 10/06/22 and had no complaints( BP was 140/80) Last Myoview 04/02/2021 without chest pain and showing mild inferior ischemia similar to prior Troponin 39--> 49 and EKG nonacute Will continue to trend troponin Received aspirin 325 in the ED Continue nitroglycerin sublingual as needed chest pain Continue home rosuvastatin, metoprolol, Imdur and aspirin We will get echo to evaluate for wall motion abnormality Cardiology consult

## 2022-10-07 NOTE — Assessment & Plan Note (Addendum)
BP 220/110 with EMS, possibly related to pain as well as missed dialysis BP was normal at cardiologist appointment the day prior at 140/80 IV hydralazine as needed., nitropaste Lasix 120 mg per Dr. Cherylann Ratel, as patient still produces urine Continue home losartan, metoprolol, isosorbide and amlodipine Expecting additional improvement with resumption of dialysis

## 2022-10-07 NOTE — ED Triage Notes (Signed)
EMS states called out to patient's residence for weakness, vomiting, chest pain and hypertension; per patient last peritoneal dialysis was 2 days ago, wife states last treatment was 1 week ago.  CBG 124 BP 220/110 CO2 22 O2 95% RR 35 Temp 97.1 oral

## 2022-10-07 NOTE — Assessment & Plan Note (Signed)
Diet controlled.  

## 2022-10-07 NOTE — ED Provider Notes (Signed)
[facility] Provider Note    Event Date/Time   First MD Initiated Contact with Patient 10/07/22 1607     (approximate)   History   N/V abd pain   HPI  Casey French is a 72 y.o. male  who presents to the emergency department today because of concern for nausea, vomiting and abdominal pain. Symptoms started last night.  Has had multiple episodes of non bloody emesis. Was accompanied by right sided abdominal pain. Last vomiting just prior to arrival to the emergency department.  Patient denies any fevers.  States that this reminds him somewhat of when he was diagnosed with diverticulitis.  Patient states he last did his peritoneal dialysis 2 nights ago.  He denies any abnormal effluent. States he still makes urine.       Physical Exam   Triage Vital Signs: ED Triage Vitals  Encounter Vitals Group     BP 10/07/22 1447 (!) 173/94     Systolic BP Percentile --      Diastolic BP Percentile --      Pulse Rate 10/07/22 1447 94     Resp 10/07/22 1447 (!) 30     Temp 10/07/22 1450 98 F (36.7 C)     Temp Source 10/07/22 1450 Oral     SpO2 10/07/22 1449 97 %     Weight 10/07/22 1446 135 lb (61.2 kg)     Height 10/07/22 1446 5\' 9"  (1.753 m)     Head Circumference --      Peak Flow --      Pain Score 10/07/22 1447 7     Pain Loc --      Pain Education --      Exclude from Growth Chart --     Most recent vital signs: Vitals:   10/07/22 1449 10/07/22 1450  BP:    Pulse:    Resp:    Temp:  98 F (36.7 C)  SpO2: 97%     General: Awake, alert, oriented. CV:  Good peripheral perfusion. Regular rate and rhythm. Resp:  Normal effort. Lungs clear. Abd:  No distention. Non tender.   ED Results / Procedures / Treatments   Labs (all labs ordered are listed, but only abnormal results are displayed) Labs Reviewed  CBC WITH DIFFERENTIAL/PLATELET - Abnormal; Notable for the following components:      Result Value   RDW 15.8 (*)    Neutro Abs 8.7  (*)    Lymphs Abs 0.3 (*)    All other components within normal limits  COMPREHENSIVE METABOLIC PANEL - Abnormal; Notable for the following components:   CO2 15 (*)    Glucose, Bld 143 (*)    BUN 42 (*)    Creatinine, Ser 6.83 (*)    Calcium 8.6 (*)    Total Protein 6.1 (*)    Albumin 3.1 (*)    GFR, Estimated 8 (*)    Anion gap 16 (*)    All other components within normal limits  BETA-HYDROXYBUTYRIC ACID - Abnormal; Notable for the following components:   Beta-Hydroxybutyric Acid 1.49 (*)    All other components within normal limits  BLOOD GAS, VENOUS - Abnormal; Notable for the following components:   pCO2, Ven 31 (*)    pO2, Ven 64 (*)    Bicarbonate 18.8 (*)    Acid-base deficit 5.1 (*)    All other components within normal limits  TROPONIN I (HIGH SENSITIVITY) - Abnormal; Notable for the following components:  Troponin I (High Sensitivity) 39 (*)    All other components within normal limits  LACTIC ACID, PLASMA  TROPONIN I (HIGH SENSITIVITY)     EKG  I, Phineas Semen, attending physician, personally viewed and interpreted this EKG  EKG Time: 1447 Rate: 94 Rhythm: sinus rhythm Axis: normal Intervals: qtc 484 QRS: narrow, LVH ST changes: no st elevation Impression: abnormal ekg   RADIOLOGY I independently interpreted and visualized the CT abd/pel. My interpretation: colitis Radiology interpretation:  IMPRESSION:  1. Pancolitis, severe in the sigmoid colon.  2. Colonic diverticulosis without evidence for acute diverticulitis.  3. Bilateral perinephric fat stranding, nonspecific. Correlate  clinically for infection.    I independently interpreted and visualized the CXR. My interpretation: No pneumonia Radiology interpretation:  IMPRESSION:  No active disease. Linear atelectasis or scar at the right base.     PROCEDURES:  Critical Care performed: No    MEDICATIONS ORDERED IN ED: Medications - No data to display   IMPRESSION / MDM / ASSESSMENT  AND PLAN / ED COURSE  I reviewed the triage vital signs and the nursing notes.                              Differential diagnosis includes, but is not limited to, diverticulitis, gastroenteritis, pancreatitis  Patient's presentation is most consistent with acute presentation with potential threat to life or bodily function.   The patient is on the cardiac monitor to evaluate for evidence of arrhythmia and/or significant heart rate changes.  Patient presented to the emergency department today because of concerns for nausea vomiting and abdominal pain.  On exam patient is non tender to the abdomen.  Nonfebrile.  Patient is on peritoneal dialysis. blood work without concerning leukocytosis.  At this time I have low concern for peritonitis. Blood work though did show slight elevated in troponin and this did increase. EKG without st elevation. I do have low concern for ACS and think elevation could be related to kidney disease. CT scan was concerning for pancolitis. Will plan on admission for further work up and management. Discussed with Dr. Para March with the hospitalst service.       FINAL CLINICAL IMPRESSION(S) / ED DIAGNOSES   Final diagnoses:  Colitis     Rx / DC Orders   ED Discharge Orders     None        Note:  This document was prepared using Dragon voice recognition software and may include unintentional dictation errors.    Phineas Semen, MD 10/08/22 [phone]

## 2022-10-07 NOTE — Assessment & Plan Note (Deleted)
History of missed PD Nephrology consult for continuation of PD

## 2022-10-07 NOTE — Assessment & Plan Note (Addendum)
ESRD on PD with missed PD sessions Bicarb 15 with anion gap 16 Will add bicarb to IV hydration Continue to monitor Nephrology consult for resumption of peritoneal dialysis Discussed with Dr. Cherylann Ratel via secure chat with following recommendations - IV Lasix 120 mg x 1 - Slow amp of bicarb x 1 to continue with bicarb infusion at 30 mL/h - Monitor output - Will do PD in the a.m. but call if any problems during the night requiring more urgent dialysis

## 2022-10-07 NOTE — Assessment & Plan Note (Signed)
Continue metoprolol and Eliquis

## 2022-10-07 NOTE — Assessment & Plan Note (Addendum)
Patient presents with a several hour history of nausea vomiting and abdominal pain without diarrhea Had clear effluent with PD when last done a couple days prior CT abdomen and pelvis showing pancolitis severe in sigmoid colon Clear liquid diet, pain control, IV antiemetics, IV fluids Discussed with Dr. Criss Rosales who recommends antibiotics to decrease chance of translocation of bacteria Rocephin and Flagyl ordered Per Dr. Cherylann Ratel, might need to consider hemodialysis if still having abdominal pain

## 2022-10-07 NOTE — Progress Notes (Signed)
   10/07/22 2315  Vitals  Temp 98 F (36.7 C)  Temp Source Oral  BP (!) 200/86  MAP (mmHg) 124  BP Location Left Arm  BP Method Automatic  Patient Position (if appropriate) Lying  Pulse Rate 79  Pulse Rate Source Dinamap  Resp 16  Level of Consciousness  Level of Consciousness Alert  MEWS COLOR  MEWS Score Color Green  MEWS Score  MEWS Temp 0  MEWS Systolic 0  MEWS Pulse 0  MEWS RR 0  MEWS LOC 0  MEWS Score 0   Provider aware. IV lasix, and nitroglycerin paste administered.

## 2022-10-07 NOTE — H&P (Signed)
History and Physical    Patient: Casey French WUJ:[id number] DOB: February 08, 1950 DOA: 10/07/2022 DOS: the patient was seen and examined on 10/07/2022 PCP: Jerl Mina, MD  Patient coming from: Home  Chief Complaint:  Chief Complaint  Patient presents with   Weakness    HPI: Casey French is a 72 y.o. male with medical history significant for CAD s/p stent angioplasty LAD and RCA, DM, HTN, A-fib on Eliquis, history of GI bleed ,ESRD on PD who still produces urine, hospitalized in June 2024 with altered mental status and hypertensive emergency related to missing dialysis for 4 days, requiring urgent dialysis who was brought to the ED by EMS with a 24 hour complaint of nonbloody, nonbilious and non-coffee-ground , vomiting and abdominal pain later followed chest pain and weakness.  He says the abdominal pain was similar to when he had diverticulitis in the past.  He denies diarrhea or black or bloody stool.  Patient reports last PD session was 2 days prior with clear effluent .  He denies fever or chills.  Denies cough or shortness of breath.  BP with EMS was 220/110. ED course and data Review: BP remained high with systolic in the high 180s, tachypneic to 30, tachycardic to the low teens, O2 sat mid 90s on room air. Labs: VBG with pH 7.39 and pCO2 31 Troponin 39-->49 CBC unremarkable with normal WBC, hemoglobin.  Lactic acid normal at 1.6 CMP notable for bicarb of 15 anion gap 16 and renal function in keeping with dialysis status.  Blood glucose 143.  Beta hydroxybutyric acid 1.49. Urinalysis greater than 500 glucose, 5 ketones EKG, personally viewed and interpreted showing sinus at 94 with LVH and borderline prolonged QT Chest x-ray with no active disease CT abdomen and pelvis showing pancolitis severe in sigmoid region and nonspecific bilateral perinephric fat stranding as further detailed below: IMPRESSION: 1. Pancolitis, severe in the sigmoid colon. 2. Colonic diverticulosis  without evidence for acute diverticulitis. 3. Bilateral perinephric fat stranding, nonspecific. Correlate clinically for infection.    Patient was given a 500 cc NS bolus x2, chewable aspirin 324 and Zofran Hospitalist consulted for admission.   Review of Systems: As mentioned in the history of present illness. All other systems reviewed and are negative.  Past Medical History:  Diagnosis Date   Acute diverticulitis 06/14/2014   Anemia    Anginal pain (HCC)    Aortic atherosclerosis (HCC)    Atrial fibrillation (HCC)    CKD (chronic kidney disease) stage 3, GFR 30-59 ml/min (HCC)    Coronary artery disease    Depression    Diabetes mellitus without complication (HCC)    Diverticulosis    GERD (gastroesophageal reflux disease)    GI bleed 05/15/2019   Gout    Heart murmur    Hematemesis 05/15/2019   High cholesterol    Hypertension    STEMI (ST elevation myocardial infarction) (HCC) 2003   Inferior   Past Surgical History:  Procedure Laterality Date   CAPD INSERTION N/A 08/13/2020   Procedure: LAPAROSCOPIC INSERTION CONTINUOUS AMBULATORY PERITONEAL DIALYSIS  (CAPD) CATHETER;  Surgeon: Campbell Lerner, MD;  Location: [facility] ORS;  Service: General;  Laterality: N/A;   CARDIAC CATHETERIZATION     CATARACT EXTRACTION W/PHACO Right 07/05/2019   Procedure: CATARACT EXTRACTION PHACO AND INTRAOCULAR LENS PLACEMENT (IOC) RIGHT DIABETIC;  Surgeon: Galen Manila, MD;  Location: [facility] ORS;  Service: Ophthalmology;  Laterality: Right;  US00:59.3 CDE8.41 LOT [id number] h   COLONOSCOPY WITH PROPOFOL N/A 04/01/2017   Procedure:  COLONOSCOPY WITH PROPOFOL;  Surgeon: Christena Deem, MD;  Location: [facility] ENDOSCOPY;  Service: Endoscopy;  Laterality: N/A;   CORONARY ANGIOPLASTY WITH STENT PLACEMENT  10/2001   RCA   CORONARY ANGIOPLASTY WITH STENT PLACEMENT  07/2015   LAD   right hand surgery  2015   3 screws in right hand   TOTAL HIP ARTHROPLASTY Left 04/22/2020   Procedure: TOTAL HIP  ARTHROPLASTY ANTERIOR APPROACH;  Surgeon: Kennedy Bucker, MD;  Location: [facility] ORS;  Service: Orthopedics;  Laterality: Left;   Social History:  reports that he quit smoking about 21 years ago. His smoking use included cigarettes. He has never used smokeless tobacco. He reports that he does not drink alcohol and does not use drugs.  Allergies  Allergen Reactions   Morphine Anxiety    agitation     Family History  Problem Relation Age of Onset   Heart disease Mother    Heart disease Father    CAD Other     Prior to Admission medications   Medication Sig Start Date End Date Taking? Authorizing Provider  allopurinol (ZYLOPRIM) 100 MG tablet Take 100 mg by mouth daily.    [provider]  amLODipine (NORVASC) 10 MG tablet Take 1 tablet (10 mg total) by mouth daily. 06/28/22   Enedina Finner, MD  apixaban (ELIQUIS) 2.5 MG TABS tablet Take 2.5 mg by mouth 2 (two) times daily.    [provider]  aspirin 81 MG EC tablet Take 81 mg by mouth daily.    [provider]  calcitRIOL (ROCALTROL) 0.25 MCG capsule Take 1 capsule (0.25 mcg total) by mouth daily. 04/25/22   Loyce Dys, MD  escitalopram (LEXAPRO) 10 MG tablet Take 10 mg by mouth daily. 05/25/22 05/20/23  [provider]  folic acid (FOLVITE) 1 MG tablet TAKE 2 TABLETS(2 MG) BY MOUTH DAILY Patient taking differently: 1 mg. Take 1 tablet daily 03/27/21   Creig Hines, MD  gabapentin (NEURONTIN) 300 MG capsule Take 300 mg by mouth at bedtime. 09/25/16   [provider]  hydrocortisone (ANUSOL-HC) 2.5 % rectal cream Place 1 application rectally 2 (two) times daily as needed for hemorrhoids or anal itching.    [provider]  isosorbide mononitrate (IMDUR) 60 MG 24 hr tablet Take 60 mg by mouth daily.    [provider]  losartan (COZAAR) 100 MG tablet Take 1 tablet (100 mg total) by mouth daily. 06/28/22   Enedina Finner, MD  meclizine (ANTIVERT) 25 MG tablet Take 1 tablet (25 mg total) by  mouth 2 (two) times daily. 04/24/22   Loyce Dys, MD  methocarbamol (ROBAXIN) 500 MG tablet Take 500 mg by mouth 3 (three) times daily as needed. 11/13/20   [provider]  metoprolol succinate (TOPROL-XL) 100 MG 24 hr tablet Take 100 mg by mouth daily. Take with or immediately following a meal.    [provider]  nitroGLYCERIN (NITROSTAT) 0.4 MG SL tablet Place 0.4 mg under the tongue every 5 (five) minutes as needed for chest pain.    [provider]  omeprazole (PRILOSEC) 20 MG capsule Take 20 mg by mouth daily.    [provider]  psyllium (METAMUCIL) 58.6 % packet Take 1 packet by mouth 2 (two) times daily.    [provider]  rosuvastatin (CRESTOR) 40 MG tablet Take 40 mg by mouth daily. 03/06/19   [provider]  Vitamin D, Ergocalciferol, (DRISDOL) 1.25 MG (50000 UNIT) CAPS capsule Take 50,000 Units  by mouth once a week. 10/23/20   [provider]    Physical Exam: Vitals:   10/07/22 1450 10/07/22 1906 10/07/22 1945 10/07/22 2000  BP:   (!) 186/100 (!) 188/101  Pulse:   (!) 111 85  Resp:   (!) 26 (!) 25  Temp: 98 F (36.7 C) 98 F (36.7 C) 98.8 F (37.1 C)   TempSrc: Oral Oral Oral   SpO2:   95% 94%  Weight:      Height:       Physical Exam Vitals and nursing note reviewed.  Constitutional:      General: He is not in acute distress. HENT:     Head: Normocephalic and atraumatic.  Cardiovascular:     Rate and Rhythm: Regular rhythm. Tachycardia present.     Heart sounds: Normal heart sounds.  Pulmonary:     Effort: Tachypnea present.     Breath sounds: Normal breath sounds.  Abdominal:     Palpations: Abdomen is soft.     Tenderness: There is no abdominal tenderness.  Neurological:     Mental Status: Mental status is at baseline.     Labs on Admission: I have personally reviewed following labs and imaging studies  CBC: Recent Labs  Lab 10/07/22 1507  WBC 9.4  NEUTROABS 8.7*  HGB 14.3  HCT  44.2  MCV 91.9  PLT 168   Basic Metabolic Panel: Recent Labs  Lab 10/07/22 1507  NA 139  K 3.9  CL 108  CO2 15*  GLUCOSE 143*  BUN 42*  CREATININE 6.83*  CALCIUM 8.6*   GFR: Estimated Creatinine Clearance: 8.6 mL/min (A) (by C-G formula based on SCr of 6.83 mg/dL (H)). Liver Function Tests: Recent Labs  Lab 10/07/22 1507  AST 34  ALT 25  ALKPHOS 90  BILITOT 0.9  PROT 6.1*  ALBUMIN 3.1*   No results for input(s): "LIPASE", "AMYLASE" in the last 168 hours. No results for input(s): "AMMONIA" in the last 168 hours. Coagulation Profile: No results for input(s): "INR", "PROTIME" in the last 168 hours. Cardiac Enzymes: No results for input(s): "CKTOTAL", "CKMB", "CKMBINDEX", "TROPONINI" in the last 168 hours. BNP (last 3 results) No results for input(s): "PROBNP" in the last 8760 hours. HbA1C: No results for input(s): "HGBA1C" in the last 72 hours. CBG: No results for input(s): "GLUCAP" in the last 168 hours. Lipid Profile: No results for input(s): "CHOL", "HDL", "LDLCALC", "TRIG", "CHOLHDL", "LDLDIRECT" in the last 72 hours. Thyroid Function Tests: No results for input(s): "TSH", "T4TOTAL", "FREET4", "T3FREE", "THYROIDAB" in the last 72 hours. Anemia Panel: No results for input(s): "VITAMINB12", "FOLATE", "FERRITIN", "TIBC", "IRON", "RETICCTPCT" in the last 72 hours. Urine analysis:    Component Value Date/Time   COLORURINE STRAW (A) 10/07/2022 2013   APPEARANCEUR CLEAR (A) 10/07/2022 2013   LABSPEC 1.012 10/07/2022 2013   PHURINE 8.0 10/07/2022 2013   GLUCOSEU >=500 (A) 10/07/2022 2013   HGBUR SMALL (A) 10/07/2022 2013   BILIRUBINUR NEGATIVE 10/07/2022 2013   KETONESUR 5 (A) 10/07/2022 2013   PROTEINUR >=300 (A) 10/07/2022 2013   NITRITE NEGATIVE 10/07/2022 2013   LEUKOCYTESUR NEGATIVE 10/07/2022 2013    Radiological Exams on Admission: CT ABDOMEN PELVIS W CONTRAST  Result Date: 10/07/2022 CLINICAL DATA:  Abdominal pain with vomiting EXAM: CT ABDOMEN AND  PELVIS WITH CONTRAST TECHNIQUE: Multidetector CT imaging of the abdomen and pelvis was performed using the standard protocol following bolus administration of intravenous contrast. RADIATION DOSE REDUCTION: This exam was performed according to the departmental dose-optimization  program which includes automated exposure control, adjustment of the mA and/or kV according to patient size and/or use of iterative reconstruction technique. CONTRAST:  OMNIPAQUE IOHEXOL 300 MG/ML  SOLN COMPARISON:  CT chest abdomen and pelvis 04/18/2022 FINDINGS: Lower chest: There is atelectasis in the lung bases. Hepatobiliary: No focal liver abnormality is seen. No gallstones, gallbladder wall thickening, or biliary dilatation. Pancreas: Unremarkable. No pancreatic ductal dilatation or surrounding inflammatory changes. Spleen: Normal in size without focal abnormality. Adrenals/Urinary Tract: There is a 2 cm cyst in the right kidney. There is mild nonspecific bilateral perinephric fat stranding. There is a subcentimeter rounded hypodensity in the lower pole the left kidney which is too small to characterize, but favored as cysts. There is no hydronephrosis. The adrenal glands and bladder are within normal limits. Stomach/Bowel: There is diffuse wall thickening of the colon, severe in the sigmoid colon. There is colonic diverticulosis as well without evidence for acute diverticulitis. The appendix is within normal limits. No bowel obstruction, pneumatosis or free air. The stomach is within normal limits. Vascular/Lymphatic: Aortic atherosclerosis. No enlarged abdominal or pelvic lymph nodes. Reproductive: Prostate is unremarkable. Other: No abdominal wall hernia or abnormality. No abdominopelvic ascites. Musculoskeletal: No acute or significant osseous findings. Left hip arthroplasty is present. IMPRESSION: 1. Pancolitis, severe in the sigmoid colon. 2. Colonic diverticulosis without evidence for acute diverticulitis. 3. Bilateral  perinephric fat stranding, nonspecific. Correlate clinically for infection. Electronically Signed   By: Darliss Cheney M.D.   On: 10/07/2022 19:31   DG Chest Port 1 View  Result Date: 10/07/2022 CLINICAL DATA:  Weakness vomiting and chest pain EXAM: PORTABLE CHEST 1 VIEW COMPARISON:  06/26/2022 FINDINGS: No acute airspace disease or pleural effusion. Linear atelectasis or scar at the right base. Stable cardiomediastinal silhouette. No pneumothorax IMPRESSION: No active disease. Linear atelectasis or scar at the right base. Electronically Signed   By: Jasmine Pang M.D.   On: 10/07/2022 16:44     Data Reviewed: Relevant notes from primary care and specialist visits, past discharge summaries as available in EHR, including Care Everywhere. Prior diagnostic testing as pertinent to current admission diagnoses Updated medications and problem lists for reconciliation ED course, including vitals, labs, imaging, treatment and response to treatment Triage notes, nursing and pharmacy notes and ED provider's notes Notable results as noted in HPI   Assessment and Plan: * Acute colitis Patient presents with a several hour history of nausea vomiting and abdominal pain without diarrhea Had clear effluent with PD when last done a couple days prior CT abdomen and pelvis showing pancolitis severe in sigmoid colon Clear liquid diet, pain control, IV antiemetics, IV fluids Discussed with Dr. Criss Rosales who recommends antibiotics to decrease chance of translocation of bacteria Rocephin and Flagyl ordered Per Dr. Cherylann Ratel, might need to consider hemodialysis if still having abdominal pain  CAD S/P percutaneous coronary angioplasty History of STEMI with RCA stent 2003, LAD stent 2017 Elevated troponin Chest pain  Patient was seen by his cardiologist, Dr. Darrold Junker the day prior on 10/06/22 and had no complaints( BP was 140/80) Last Myoview 04/02/2021 without chest pain and showing mild inferior ischemia similar to  prior Troponin 39--> 49 and EKG nonacute Will continue to trend troponin Received aspirin 325 in the ED Continue nitroglycerin sublingual as needed chest pain Continue home rosuvastatin, metoprolol, Imdur and aspirin We will get echo to evaluate for wall motion abnormality Cardiology consult  High anion gap metabolic acidosis ESRD on PD with missed PD sessions Bicarb 15 with anion  gap 16 Will add bicarb to IV hydration Continue to monitor Nephrology consult for resumption of peritoneal dialysis Discussed with Dr. Cherylann Ratel via secure chat with following recommendations - IV Lasix 120 mg x 1 - Slow amp of bicarb x 1 to continue with bicarb infusion at 30 mL/h - Monitor output - Will do PD in the a.m. but call if any problems during the night requiring more urgent dialysis  Hypertensive urgency, malignant BP 220/110 with EMS, possibly related to pain as well as missed dialysis BP was normal at cardiologist appointment the day prior at 140/80 IV hydralazine as needed., nitropaste Lasix 120 mg per Dr. Cherylann Ratel, as patient still produces urine Continue home losartan, metoprolol, isosorbide and amlodipine Expecting additional improvement with resumption of dialysis  PAF (paroxysmal atrial fibrillation) (HCC) on chronic anticoagulation Continue metoprolol and Eliquis  DM type 2 (diabetes mellitus, type 2) (HCC) Diet controlled    DVT prophylaxis: Eliquis  Consults: Cardiology, nephrology  Advance Care Planning:   Code Status: Prior   Family Communication: none  Disposition Plan: Back to previous home environment  Severity of Illness: The appropriate patient status for this patient is INPATIENT. Inpatient status is judged to be reasonable and necessary in order to provide the required intensity of service to ensure the patient's safety. The patient's presenting symptoms, physical exam findings, and initial radiographic and laboratory data in the context of their chronic  comorbidities is felt to place them at high risk for further clinical deterioration. Furthermore, it is not anticipated that the patient will be medically stable for discharge from the hospital within 2 midnights of admission.   * I certify that at the point of admission it is my clinical judgment that the patient will require inpatient hospital care spanning beyond 2 midnights from the point of admission due to high intensity of service, high risk for further deterioration and high frequency of surveillance required.*  Author: Andris Baumann, MD 10/07/2022 9:33 PM  For on call review [email or web address].

## 2022-10-08 ENCOUNTER — Inpatient Hospital Stay: Admit: 2022-10-08 | Payer: Medicare PPO

## 2022-10-08 DIAGNOSIS — K529 Noninfective gastroenteritis and colitis, unspecified: Secondary | ICD-10-CM | POA: Diagnosis not present

## 2022-10-08 LAB — BASIC METABOLIC PANEL
Anion gap: 10 (ref 5–15)
BUN: 48 mg/dL — ABNORMAL HIGH (ref 8–23)
CO2: 21 mmol/L — ABNORMAL LOW (ref 22–32)
Calcium: 8.3 mg/dL — ABNORMAL LOW (ref 8.9–10.3)
Chloride: 111 mmol/L (ref 98–111)
Creatinine, Ser: 7.09 mg/dL — ABNORMAL HIGH (ref 0.61–1.24)
GFR, Estimated: 8 mL/min — ABNORMAL LOW (ref >60)
Glucose, Bld: 92 mg/dL (ref 70–99)
Potassium: 3.7 mmol/L (ref 3.5–5.1)
Sodium: 142 mmol/L (ref 135–145)

## 2022-10-08 LAB — GLUCOSE, CAPILLARY
Glucose-Capillary: 70 mg/dL (ref 70–99)
Glucose-Capillary: 122 mg/dL — ABNORMAL HIGH (ref 70–99)
Glucose-Capillary: 116 mg/dL — ABNORMAL HIGH (ref 70–99)

## 2022-10-08 LAB — CBC
HCT: 40.1 % (ref 39.0–52.0)
Hemoglobin: 13.3 g/dL (ref 13.0–17.0)
MCH: 30.1 pg (ref 26.0–34.0)
MCHC: 33.2 g/dL (ref 30.0–36.0)
MCV: 90.7 fL (ref 80.0–100.0)
Platelets: 172 10*3/uL (ref 150–400)
RBC: 4.42 MIL/uL (ref 4.22–5.81)
RDW: 15.9 % — ABNORMAL HIGH (ref 11.5–15.5)
WBC: 9.7 10*3/uL (ref 4.0–10.5)
nRBC: 0 % (ref 0.0–0.2)

## 2022-10-08 MED ORDER — GENTAMICIN SULFATE 0.1 % EX CREA
1.0000 | TOPICAL_CREAM | Freq: Every day | CUTANEOUS | Status: DC
  Administered 2022-10-09 – 2022-10-11 (×2): 1 via TOPICAL
  Filled 2022-10-08 (×2): qty 15

## 2022-10-08 MED ORDER — DELFLEX-LC/1.5% DEXTROSE 344 MOSM/L IP SOLN
INTRAPERITONEAL | Status: DC
  Filled 2022-10-08 (×4): qty 3000

## 2022-10-08 NOTE — Progress Notes (Signed)
[facility]  ROUNDING NOTE   Subjective:  Patient well-known to [place] as we follow him for outpatient peritoneal dialysis at [facility], monitored by Dr. Thedore Mins.  He has missed PD for the past 4 days. He apparently has been having vomiting for 4 days prior to admission. In addition he has had abdominal pain. CT scan abdomen pelvis performed which shows pancolitis. Patient states that he has not had cloudy fluid recently.  Objective:  Vital signs in last 24 hours:  Temp:  [98 F (36.7 C)-98.8 F (37.1 C)] 98.4 F (36.9 C) (09/13 1238) Pulse Rate:  [68-111] 72 (09/13 1238) Resp:  [15-30] 18 (09/13 1238) BP: (164-200)/(86-101) 172/87 (09/13 1238) SpO2:  [90 %-98 %] 98 % (09/13 1238) Weight:  [59.5 kg-61.2 kg] 59.5 kg (09/13 0458)  Weight change:  Filed Weights   10/07/22 1446 10/08/22 0458  Weight: 61.2 kg 59.5 kg    Intake/Output: I/O last 3 completed shifts: In: 334.5 [I.V.:134.5; IV Piggyback:200] Out: 650 [Urine:650]   Intake/Output this shift:  Total I/O In: 120 [P.O.:120] Out: 300 [Urine:300]  Physical Exam: General: No acute distress  Head: Normocephalic, atraumatic. Moist oral mucosal membranes  Neck: Supple  Lungs:  Clear to auscultation, normal effort  Heart: S1S2 no rubs  Abdomen:  Soft, mild diffuse tenderness.  Extremities: No peripheral edema.  Neurologic: Awake, alert, following commands  Skin: No acute rash  Access: PD catheter in place    Basic Metabolic Panel: Recent Labs  Lab 10/07/22 1507 10/07/22 1510 10/08/22 0501  NA 139  --  142  K 3.9  --  3.7  CL 108  --  111  CO2 15*  --  21*  GLUCOSE 143*  --  92  BUN 42*  --  48*  CREATININE 6.83*  --  7.09*  CALCIUM 8.6*  --  8.3*  MG  --  1.8  --     Liver Function Tests: Recent Labs  Lab 10/07/22 1507  AST 34  ALT 25  ALKPHOS 90  BILITOT 0.9  PROT 6.1*  ALBUMIN 3.1*   No results for input(s): "LIPASE", "AMYLASE" in the last 168 hours. No results for input(s):  "AMMONIA" in the last 168 hours.  CBC: Recent Labs  Lab 10/07/22 1507 10/08/22 0501  WBC 9.4 9.7  NEUTROABS 8.7*  --   HGB 14.3 13.3  HCT 44.2 40.1  MCV 91.9 90.7  PLT 168 172    Cardiac Enzymes: No results for input(s): "CKTOTAL", "CKMB", "CKMBINDEX", "TROPONINI" in the last 168 hours.  BNP: Invalid input(s): "POCBNP"  CBG: Recent Labs  Lab 10/07/22 2326 10/08/22 1239  GLUCAP 101* 70    Microbiology: Results for orders placed or performed during the hospital encounter of 06/26/22  SARS Coronavirus 2 by RT PCR (hospital order, performed in [facility] hospital lab) *cepheid single result test* Anterior Nasal Swab     Status: None   Collection Time: 06/26/22  5:13 PM   Specimen: Anterior Nasal Swab  Result Value Ref Range Status   SARS Coronavirus 2 by RT PCR NEGATIVE NEGATIVE Final    Comment: (NOTE) SARS-CoV-2 target nucleic acids are NOT DETECTED.  The SARS-CoV-2 RNA is generally detectable in upper and lower respiratory specimens during the acute phase of infection. The lowest concentration of SARS-CoV-2 viral copies this assay can detect is 250 copies / mL. A negative result does not preclude SARS-CoV-2 infection and should not be used as the sole basis for treatment or other patient management decisions.  A negative result may occur with improper specimen collection / handling, submission of specimen other than nasopharyngeal swab, presence of viral mutation(s) within the areas targeted by this assay, and inadequate number of viral copies (<250 copies / mL). A negative result must be combined with clinical observations, patient history, and epidemiological information.  Fact Sheet for Patients:   RoadLapTop.co.za  Fact Sheet for Healthcare Providers: http://kim-miller.com/  This test is not yet approved or  cleared by the [place] FDA and has been authorized for detection and/or diagnosis of SARS-CoV-2  by FDA under an Emergency Use Authorization (EUA).  This EUA will remain in effect (meaning this test can be used) for the duration of the COVID-19 declaration under Section 564(b)(1) of the Act, 21 U.S.C. section 360bbb-3(b)(1), unless the authorization is terminated or revoked sooner.  Performed at [facility], [address] [place], [place] [address]   MRSA Next Gen by PCR, Nasal     Status: None   Collection Time: 06/26/22  5:31 PM   Specimen: Nasal Mucosa; Nasal Swab  Result Value Ref Range Status   MRSA by PCR Next Gen NOT DETECTED NOT DETECTED Final    Comment: (NOTE) The GeneXpert MRSA Assay (FDA approved for NASAL specimens only), is one component of a comprehensive MRSA colonization surveillance program. It is not intended to diagnose MRSA infection nor to guide or monitor treatment for MRSA infections. Test performance is not FDA approved in patients less than 36 years old. Performed at [facility], [address] [place], [place] [address]     Coagulation Studies: No results for input(s): "LABPROT", "INR" in the last 72 hours.  Urinalysis: Recent Labs    10/07/22 2013  COLORURINE STRAW*  LABSPEC 1.012  PHURINE 8.0  GLUCOSEU >=500*  HGBUR SMALL*  BILIRUBINUR NEGATIVE  KETONESUR 5*  PROTEINUR >=300*  NITRITE NEGATIVE  LEUKOCYTESUR NEGATIVE      Imaging: CT ABDOMEN PELVIS W CONTRAST  Result Date: 10/07/2022 CLINICAL DATA:  Abdominal pain with vomiting EXAM: CT ABDOMEN AND PELVIS WITH CONTRAST TECHNIQUE: Multidetector CT imaging of the abdomen and pelvis was performed using the standard protocol following bolus administration of intravenous contrast. RADIATION DOSE REDUCTION: This exam was performed according to the departmental dose-optimization program which includes automated exposure control, adjustment of the mA and/or kV according to patient size and/or use of iterative reconstruction technique. CONTRAST:  OMNIPAQUE IOHEXOL  300 MG/ML  SOLN COMPARISON:  CT chest abdomen and pelvis 04/18/2022 FINDINGS: Lower chest: There is atelectasis in the lung bases. Hepatobiliary: No focal liver abnormality is seen. No gallstones, gallbladder wall thickening, or biliary dilatation. Pancreas: Unremarkable. No pancreatic ductal dilatation or surrounding inflammatory changes. Spleen: Normal in size without focal abnormality. Adrenals/Urinary Tract: There is a 2 cm cyst in the right kidney. There is mild nonspecific bilateral perinephric fat stranding. There is a subcentimeter rounded hypodensity in the lower pole the left kidney which is too small to characterize, but favored as cysts. There is no hydronephrosis. The adrenal glands and bladder are within normal limits. Stomach/Bowel: There is diffuse wall thickening of the colon, severe in the sigmoid colon. There is colonic diverticulosis as well without evidence for acute diverticulitis. The appendix is within normal limits. No bowel obstruction, pneumatosis or free air. The stomach is within normal limits. Vascular/Lymphatic: Aortic atherosclerosis. No enlarged abdominal or pelvic lymph nodes. Reproductive: Prostate is unremarkable. Other: No abdominal wall hernia or abnormality. No abdominopelvic ascites. Musculoskeletal: No acute or significant osseous findings. Left hip  arthroplasty is present. IMPRESSION: 1. Pancolitis, severe in the sigmoid colon. 2. Colonic diverticulosis without evidence for acute diverticulitis. 3. Bilateral perinephric fat stranding, nonspecific. Correlate clinically for infection. Electronically Signed   By: Darliss Cheney M.D.   On: 10/07/2022 19:31   DG Chest Port 1 View  Result Date: 10/07/2022 CLINICAL DATA:  Weakness vomiting and chest pain EXAM: PORTABLE CHEST 1 VIEW COMPARISON:  06/26/2022 FINDINGS: No acute airspace disease or pleural effusion. Linear atelectasis or scar at the right base. Stable cardiomediastinal silhouette. No pneumothorax IMPRESSION: No  active disease. Linear atelectasis or scar at the right base. Electronically Signed   By: Jasmine Pang M.D.   On: 10/07/2022 16:44     Medications:    cefTRIAXone (ROCEPHIN)  IV Stopped (10/08/22 0123)   dialysis solution 1.5% low-MG/low-CA     metronidazole 500 mg (10/08/22 1127)   sodium bicarbonate 150 mEq in sterile water 1,150 mL infusion 30 mL/hr at 10/08/22 0029    amLODipine  10 mg Oral Daily   apixaban  2.5 mg Oral BID   calcitRIOL  0.25 mcg Oral Daily   escitalopram  10 mg Oral Daily   gentamicin cream  1 Application Topical Daily   insulin aspart  0-5 Units Subcutaneous QHS   insulin aspart  0-6 Units Subcutaneous TID WC   isosorbide mononitrate  60 mg Oral Daily   losartan  100 mg Oral Daily   metoprolol succinate  100 mg Oral Daily   nitroGLYCERIN  1 inch Topical Q6H   pantoprazole  40 mg Oral Daily   rosuvastatin  40 mg Oral Daily   acetaminophen **OR** acetaminophen, HYDROmorphone (DILAUDID) injection, nitroGLYCERIN, ondansetron **OR** ondansetron (ZOFRAN) IV  Assessment/ Plan:  72 y.o. male with past medical history of coronary artery disease status post stent placement, diabetes mellitus type 2, pretension, atrial fibrillation, history of GI bleed, anemia of chronic kidney disease, secondary hyperparathyroidism, hyperlipidemia, gout, GERD, diverticulosis who presented with nausea, vomiting and acute pancolitis most severe in the sigmoid colon.  1.  ESRD on peritoneal dialysis.  Patient reports that he did not perform treatment for 4 days prior to admission.  Prior to this he has not had any sign of cloudy fluid.  We will plan for PD treatment today during the daytime to make sure he is not having excessive amounts of pain with the treatment.  We will use a lower flow volume of 2 L and use 1.5% dextrose solution.  2.  Pancolitis.  He has history of known diverticulosis but did not have acute diverticulitis.  He did have colon wall thickening.  Has been started on  ceftriaxone as well as metronidazole.  Additional workup as per hospitalist.  3.  Acute metabolic acidosis.  Patient started on sodium bicarbonate solution at 30 cc/h.  Okay to continue for now.  4.  Hypertension.  Continue amlodipine, losartan, Imdur.  5.  Secondary hyperparathyroidism.  Maintain the patient Calcitrol 0.25 mcg p.o. daily.   LOS: 1 Dan Dissinger 9/13/20241:47 PM

## 2022-10-08 NOTE — Progress Notes (Signed)
  Peritoneal Dialysis Treatment Initiation Note     Pre Treatment Weight: 54.7KG   Consent signed and in chart.  PD treatment initiated via aseptic technique.    Patient is awake and alert. No complaints of pain.    PD exit site clean, dry and intact.     Hand-off given to the patient's nurse.  Education provided to dept staff  regarding PD machine and how  to contact tech support if machine  alarms.      Lynann Beaver LPN [facility]

## 2022-10-08 NOTE — Progress Notes (Addendum)
Progress Note    Casey French  ZOX:[id number] DOB: [birth year]  DOA: 10/07/2022 PCP: Jerl Mina, MD      Brief Narrative:    Medical records reviewed and are as summarized below:  Casey French is a 72 y.o. male  with medical history significant for CAD s/p stent angioplasty LAD and RCA, DM, HTN, A-fib on Eliquis, history of GI bleed , history of diverticulitis, ESRD on PD who still produces urine, hospitalized in June 2024 with altered mental status and hypertensive emergency related to missing dialysis for 4 days and required urgent dialysis at that time.  He presented to the hospital because of general weakness, vomiting, abdominal pain and chest pain.  His BP was 220/110 when EMS picked him up.  In the ED, his BP was 209/100.  CT abdomen showed severe pancolitis in the sigmoid colon.   He was admitted to the hospital for hypertensive urgency and sigmoid colon pancolitis.      Assessment/Plan:   Principal Problem:   Acute colitis Active Problems:   CAD S/P percutaneous coronary angioplasty   Chest pain   Elevated troponin   ESRD on peritoneal dialysis (HCC)   High anion gap metabolic acidosis   Hypertensive urgency, malignant   PAF (paroxysmal atrial fibrillation) (HCC) on chronic anticoagulation   DM type 2 (diabetes mellitus, type 2) (HCC)    Body mass index is 19.37 kg/m.   Acute sigmoid colon pancolitis: Continue empiric IV ceftriaxone and Flagyl.  Analgesics as needed for pain.   Hypertensive urgency: BP is better.  Continue antihypertensives.  (Amlodipine, losartan, metoprolol and isosorbide mononitrate)   ESRD on PD at home: Follow-up with nephrologist for dialysis.   Paroxysmal atrial fibrillation: Continue Eliquis   Mildly elevated and flat troponins and chest pain in a patient with CAD s/p RCA (2003) and LAD (2017) stents: On Eliquis.  Follow-up with cardiologist.   Type II DM: NovoLog as needed for hyperglycemia.  Diet  Order             Diet clear liquid Room service appropriate? Yes; Fluid consistency: Thin  Diet effective now                            Consultants: Nephrologist  Procedures: None    Medications:    amLODipine  10 mg Oral Daily   apixaban  2.5 mg Oral BID   calcitRIOL  0.25 mcg Oral Daily   escitalopram  10 mg Oral Daily   gentamicin cream  1 Application Topical Daily   insulin aspart  0-5 Units Subcutaneous QHS   insulin aspart  0-6 Units Subcutaneous TID WC   isosorbide mononitrate  60 mg Oral Daily   losartan  100 mg Oral Daily   metoprolol succinate  100 mg Oral Daily   nitroGLYCERIN  1 inch Topical Q6H   pantoprazole  40 mg Oral Daily   rosuvastatin  40 mg Oral Daily   Continuous Infusions:  cefTRIAXone (ROCEPHIN)  IV Stopped (10/08/22 0123)   dialysis solution 1.5% low-MG/low-CA     metronidazole 500 mg (10/08/22 1127)   sodium bicarbonate 150 mEq in sterile water 1,150 mL infusion 30 mL/hr at 10/08/22 0029     Anti-infectives (From admission, onward)    Start     Dose/Rate Route Frequency Ordered Stop   10/07/22 2230  cefTRIAXone (ROCEPHIN) 2 g in sodium chloride 0.9 % 100 mL IVPB  2 g 200 mL/hr over 30 Minutes Intravenous Every 24 hours 10/07/22 2219     10/07/22 2230  metroNIDAZOLE (FLAGYL) IVPB 500 mg        500 mg 100 mL/hr over 60 Minutes Intravenous Every 12 hours 10/07/22 2219                Family Communication/Anticipated D/C date and plan/Code Status   DVT prophylaxis: apixaban (ELIQUIS) tablet 2.5 mg Start: 10/07/22 2245 apixaban (ELIQUIS) tablet 2.5 mg     Code Status: Full Code  Family Communication: None Disposition Plan: Plan to charge home   Status is: Inpatient Remains inpatient appropriate because: Acute colitis       Subjective:   Interval events noted.  Abdominal pain has improved.  No vomiting or diarrhea.  Objective:    Vitals:   10/08/22 0030 10/08/22 0248 10/08/22 0458 10/08/22  0507  BP: (!) 178/86 (!) 169/92  (!) 164/92  Pulse: 77 68  70  Resp:    16  Temp:    98.3 F (36.8 C)  TempSrc:    Oral  SpO2:    97%  Weight:   59.5 kg   Height:       No data found.   Intake/Output Summary (Last 24 hours) at 10/08/2022 1135 Last data filed at 10/08/2022 1043 Gross per 24 hour  Intake 454.51 ml  Output 950 ml  Net -495.49 ml   Filed Weights   10/07/22 1446 10/08/22 0458  Weight: 61.2 kg 59.5 kg    Exam:  GEN: NAD SKIN: Warm and dry EYES: No pallor or icterus ENT: MMM CV: RRR PULM: CTA B ABD: soft, ND, NT, +BS, + peritoneal cath left side of abdomen CNS: AAO x 3, non focal EXT: No edema or tenderness        Data Reviewed:   I have personally reviewed following labs and imaging studies:  Labs: Labs show the following:   Basic Metabolic Panel: Recent Labs  Lab 10/07/22 1507 10/07/22 1510 10/08/22 0501  NA 139  --  142  K 3.9  --  3.7  CL 108  --  111  CO2 15*  --  21*  GLUCOSE 143*  --  92  BUN 42*  --  48*  CREATININE 6.83*  --  7.09*  CALCIUM 8.6*  --  8.3*  MG  --  1.8  --    GFR Estimated Creatinine Clearance: 8 mL/min (A) (by C-G formula based on SCr of 7.09 mg/dL (H)). Liver Function Tests: Recent Labs  Lab 10/07/22 1507  AST 34  ALT 25  ALKPHOS 90  BILITOT 0.9  PROT 6.1*  ALBUMIN 3.1*   No results for input(s): "LIPASE", "AMYLASE" in the last 168 hours. No results for input(s): "AMMONIA" in the last 168 hours. Coagulation profile No results for input(s): "INR", "PROTIME" in the last 168 hours.  CBC: Recent Labs  Lab 10/07/22 1507 10/08/22 0501  WBC 9.4 9.7  NEUTROABS 8.7*  --   HGB 14.3 13.3  HCT 44.2 40.1  MCV 91.9 90.7  PLT 168 172   Cardiac Enzymes: No results for input(s): "CKTOTAL", "CKMB", "CKMBINDEX", "TROPONINI" in the last 168 hours. BNP (last 3 results) No results for input(s): "PROBNP" in the last 8760 hours. CBG: Recent Labs  Lab 10/07/22 2326  GLUCAP 101*   D-Dimer: No results  for input(s): "DDIMER" in the last 72 hours. Hgb A1c: No results for input(s): "HGBA1C" in the last 72 hours. Lipid Profile: No  results for input(s): "CHOL", "HDL", "LDLCALC", "TRIG", "CHOLHDL", "LDLDIRECT" in the last 72 hours. Thyroid function studies: No results for input(s): "TSH", "T4TOTAL", "T3FREE", "THYROIDAB" in the last 72 hours.  Invalid input(s): "FREET3" Anemia work up: No results for input(s): "VITAMINB12", "FOLATE", "FERRITIN", "TIBC", "IRON", "RETICCTPCT" in the last 72 hours. Sepsis Labs: Recent Labs  Lab 10/07/22 1507 10/08/22 0501  WBC 9.4 9.7  LATICACIDVEN 1.6  --     Microbiology No results found for this or any previous visit (from the past 240 hour(s)).  Procedures and diagnostic studies:  CT ABDOMEN PELVIS W CONTRAST  Result Date: 10/07/2022 CLINICAL DATA:  Abdominal pain with vomiting EXAM: CT ABDOMEN AND PELVIS WITH CONTRAST TECHNIQUE: Multidetector CT imaging of the abdomen and pelvis was performed using the standard protocol following bolus administration of intravenous contrast. RADIATION DOSE REDUCTION: This exam was performed according to the departmental dose-optimization program which includes automated exposure control, adjustment of the mA and/or kV according to patient size and/or use of iterative reconstruction technique. CONTRAST:  OMNIPAQUE IOHEXOL 300 MG/ML  SOLN COMPARISON:  CT chest abdomen and pelvis 04/18/2022 FINDINGS: Lower chest: There is atelectasis in the lung bases. Hepatobiliary: No focal liver abnormality is seen. No gallstones, gallbladder wall thickening, or biliary dilatation. Pancreas: Unremarkable. No pancreatic ductal dilatation or surrounding inflammatory changes. Spleen: Normal in size without focal abnormality. Adrenals/Urinary Tract: There is a 2 cm cyst in the right kidney. There is mild nonspecific bilateral perinephric fat stranding. There is a subcentimeter rounded hypodensity in the lower pole the left kidney which is  too small to characterize, but favored as cysts. There is no hydronephrosis. The adrenal glands and bladder are within normal limits. Stomach/Bowel: There is diffuse wall thickening of the colon, severe in the sigmoid colon. There is colonic diverticulosis as well without evidence for acute diverticulitis. The appendix is within normal limits. No bowel obstruction, pneumatosis or free air. The stomach is within normal limits. Vascular/Lymphatic: Aortic atherosclerosis. No enlarged abdominal or pelvic lymph nodes. Reproductive: Prostate is unremarkable. Other: No abdominal wall hernia or abnormality. No abdominopelvic ascites. Musculoskeletal: No acute or significant osseous findings. Left hip arthroplasty is present. IMPRESSION: 1. Pancolitis, severe in the sigmoid colon. 2. Colonic diverticulosis without evidence for acute diverticulitis. 3. Bilateral perinephric fat stranding, nonspecific. Correlate clinically for infection. Electronically Signed   By: Darliss Cheney M.D.   On: 10/07/2022 19:31   DG Chest Port 1 View  Result Date: 10/07/2022 CLINICAL DATA:  Weakness vomiting and chest pain EXAM: PORTABLE CHEST 1 VIEW COMPARISON:  06/26/2022 FINDINGS: No acute airspace disease or pleural effusion. Linear atelectasis or scar at the right base. Stable cardiomediastinal silhouette. No pneumothorax IMPRESSION: No active disease. Linear atelectasis or scar at the right base. Electronically Signed   By: Jasmine Pang M.D.   On: 10/07/2022 16:44               LOS: 1 day   C[place] Paiz  [facility]   Pager on [email or web address]. If 7PM-7AM, please contact night-coverage at [email or web address]     10/08/2022, 11:35 AM

## 2022-10-08 NOTE — Plan of Care (Signed)

## 2022-10-08 NOTE — Plan of Care (Signed)
Patient progressing well

## 2022-10-09 ENCOUNTER — Inpatient Hospital Stay
Admit: 2022-10-09 | Discharge: 2022-10-09 | Disposition: A | Discharge status: Home / Self Care | Payer: Medicare PPO | Attending: Internal Medicine | Admitting: Internal Medicine

## 2022-10-09 DIAGNOSIS — K529 Noninfective gastroenteritis and colitis, unspecified: Secondary | ICD-10-CM | POA: Diagnosis not present

## 2022-10-09 LAB — GLUCOSE, CAPILLARY
Glucose-Capillary: 91 mg/dL (ref 70–99)
Glucose-Capillary: 92 mg/dL (ref 70–99)
Glucose-Capillary: 95 mg/dL (ref 70–99)
Glucose-Capillary: 143 mg/dL — ABNORMAL HIGH (ref 70–99)

## 2022-10-09 LAB — BASIC METABOLIC PANEL
Anion gap: 12 (ref 5–15)
BUN: 43 mg/dL — ABNORMAL HIGH (ref 8–23)
CO2: 22 mmol/L (ref 22–32)
Calcium: 7.3 mg/dL — ABNORMAL LOW (ref 8.9–10.3)
Chloride: 102 mmol/L (ref 98–111)
Creatinine, Ser: 6.92 mg/dL — ABNORMAL HIGH (ref 0.61–1.24)
GFR, Estimated: 8 mL/min — ABNORMAL LOW (ref >60)
Glucose, Bld: 87 mg/dL (ref 70–99)
Potassium: 3 mmol/L — ABNORMAL LOW (ref 3.5–5.1)
Sodium: 136 mmol/L (ref 135–145)

## 2022-10-09 LAB — ECHOCARDIOGRAM COMPLETE
AR max vel: 1.97 cm2
AV Peak grad: 13 mmHg
Ao pk vel: 1.8 m/s
Area-P 1/2: 2.55 cm2
Height: 69 in
S' Lateral: 2.2 cm
Weight: 2021.18 [oz_av]

## 2022-10-09 LAB — MAGNESIUM: Magnesium: 1.5 mg/dL — ABNORMAL LOW (ref 1.7–2.4)

## 2022-10-09 MED ORDER — HYDROMORPHONE HCL 1 MG/ML IJ SOLN: 0.5000 mg | INTRAMUSCULAR | Status: DC | PRN

## 2022-10-09 MED ORDER — OXYCODONE HCL 5 MG PO TABS
5.0000 mg | ORAL_TABLET | Freq: Three times a day (TID) | ORAL | Status: DC | PRN
  Administered 2022-10-09 – 2022-10-10 (×3): 5 mg via ORAL
  Filled 2022-10-09 (×3): qty 1

## 2022-10-09 MED ORDER — POTASSIUM CHLORIDE CRYS ER 20 MEQ PO TBCR
40.0000 meq | EXTENDED_RELEASE_TABLET | Freq: Once | ORAL | Status: AC
  Administered 2022-10-09: 40 meq via ORAL
  Filled 2022-10-09: qty 2

## 2022-10-09 MED ORDER — MAGNESIUM SULFATE IN D5W 1-5 GM/100ML-% IV SOLN
1.0000 g | Freq: Once | INTRAVENOUS | Status: AC
  Administered 2022-10-09: 1 g via INTRAVENOUS
  Filled 2022-10-09: qty 100

## 2022-10-09 NOTE — TOC Initial Note (Signed)
Transition of Care [facility]) - Initial/Assessment Note    Patient Details  Name: Casey French MRN: [id number] Date of Birth: 01/28/1950  Transition of Care [facility]) CM/SW Contact:    Liliana Cline, LCSW Phone Number: 10/09/2022, 11:59 AM  Clinical Narrative:                 Met with patient at bedside for assessment. Patient is from home with his wife. Patient drives. PCP is Dr. Burnett Sheng. Pharmacy is [facility]. Patient has a cane and walker at home. Patient states he has had HH in the past. No SNF history. Denies needs at this time.   Expected Discharge Plan: Home/Self Care Barriers to Discharge: Continued Medical Work up   Patient Goals and CMS Choice Patient states their goals for this hospitalization and ongoing recovery are:: home with spouse CMS Medicare.gov Compare Post Acute Care list provided to:: Patient Choice offered to / list presented to : Patient      Expected Discharge Plan and Services       Living arrangements for the past 2 months: Single Family Home                                      Prior Living Arrangements/Services Living arrangements for the past 2 months: Single Family Home Lives with:: Spouse Patient language and need for interpreter reviewed:: Yes Do you feel safe going back to the place where you live?: Yes      Need for Family Participation in Patient Care: Yes (Comment) Care giver support system in place?: Yes (comment) Current home services: DME Criminal Activity/Legal Involvement Pertinent to Current Situation/Hospitalization: No - Comment as needed  Activities of Daily Living Home Assistive Devices/Equipment: None ADL Screening (condition at time of admission) Patient's cognitive ability adequate to safely complete daily activities?: Yes Is the patient deaf or have difficulty hearing?: No Does the patient have difficulty seeing, even when wearing glasses/contacts?: No Does the patient have difficulty concentrating,  remembering, or making decisions?: No Patient able to express need for assistance with ADLs?: Yes Does the patient have difficulty dressing or bathing?: No Independently performs ADLs?: Yes (appropriate for developmental age) Does the patient have difficulty walking or climbing stairs?: No Weakness of Legs: None Weakness of Arms/Hands: None  Permission Sought/Granted Permission sought to share information with : Facility Industrial/product designer granted to share information with : Yes, Verbal Permission Granted     Permission granted to share info w AGENCY: as needed        Emotional Assessment       Orientation: : Oriented to Self, Oriented to Place, Oriented to  Time, Oriented to Situation Alcohol / Substance Use: Not Applicable Psych Involvement: No (comment)  Admission diagnosis:  Colitis [K52.9] Acute colitis [K52.9] Patient Active Problem List   Diagnosis Date Noted   Acute colitis 10/07/2022   Elevated troponin 10/07/2022   High anion gap metabolic acidosis 10/07/2022   Acute metabolic encephalopathy 06/26/2022   CAP (community acquired pneumonia) 06/26/2022   Acute respiratory failure with hypoxia (HCC) 06/26/2022   Hypertensive urgency, malignant 06/26/2022   Acute pulmonary edema (HCC) 06/26/2022   Coag negative Staphylococcus bacteremia 04/22/2022   Diverticulitis 04/18/2022   Hypokalemia 04/18/2022   Hyperkalemia 03/08/2021   Peritoneal dialysis catheter dysfunction (HCC) ESRD on peritoneal dialysis 03/07/2021   PAF (paroxysmal atrial fibrillation) (HCC) on chronic anticoagulation 03/07/2021   Chest pain 12/02/2020  HTN (hypertension) 12/02/2020   HLD (hyperlipidemia) 12/02/2020   Gout 12/02/2020   ESRD on peritoneal dialysis (HCC) 12/02/2020   B12 deficiency 07/10/2020   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   S/P hip replacement 04/22/2020   Hospital discharge follow-up 02/19/2020   Iron deficiency anemia 02/19/2020   Preop  cardiovascular exam 01/09/2020   Anemia in chronic kidney disease 11/15/2019   Benign hypertensive kidney disease with chronic kidney disease 11/15/2019   Chronic kidney disease, stage 3b (HCC) 11/15/2019   Proteinuria 11/15/2019   Hematochezia 05/15/2019   Chronic anticoagulation 05/15/2019   Atrial fibrillation, chronic (HCC) 05/15/2019   Chronic renal disease, stage IV (HCC) 05/15/2019   Unstable angina (HCC) 11/10/2016   S/P cardiac catheterization 09/09/2015   Angina at rest 05/01/2015   Hypertensive urgency 06/14/2014   AKI (acute kidney injury) (HCC) 06/14/2014   Benign essential HTN 06/14/2014   Hyperlipidemia 06/14/2014   DM type 2 (diabetes mellitus, type 2) (HCC) 06/14/2014   CAD S/P percutaneous coronary angioplasty 06/14/2014   GERD without esophagitis 06/14/2014   Acute kidney failure (HCC) 06/14/2014   Tremor 03/18/2014   Fall at home 08/30/2013   SOB (shortness of breath) on exertion 08/30/2013   Closed head injury 03/24/2013   Unspecified injury of head, initial encounter 03/24/2013   PCP:  Jerl Mina, MD Pharmacy:   [facility] [phone] Nicholes Rough, [place] - [address] AT [facility] [address] [place] [address] Phone: [phone] Fax: [phone]  [facility] #[id number] Cheree Ditto, [place] - [address] AT [facility] [address] [place] [address] [place] [address] Phone: [phone] Fax: [phone]     Social Determinants of Health (SDOH) Social History: SDOH Screenings   Food Insecurity: No Food Insecurity (10/07/2022)  Housing: Low Risk  (10/07/2022)  Transportation Needs: No Transportation Needs (10/07/2022)  Utilities: Not At Risk (10/07/2022)  Tobacco Use: Medium Risk (10/07/2022)   SDOH Interventions:     Readmission Risk Interventions    10/09/2022   11:58 AM 06/28/2022   10:16 AM 03/10/2021    9:22 AM  Readmission Risk Prevention Plan  Transportation Screening Complete Complete Complete  PCP or  Specialist Appt within 3-5 Days Complete Complete Complete  HRI or Home Care Consult Complete    Social Work Consult for Recovery Care Planning/Counseling Complete Complete Complete  Palliative Care Screening Not Applicable Not Applicable Not Applicable  Medication Review Oceanographer) Complete Complete Complete

## 2022-10-09 NOTE — Progress Notes (Signed)
Peritoneal Dialysis Treatment Initiation Note  Pre TX VS:see table below  Pre TX weight:57.0kg  Consent signed and in chart. PD treatment initiated via aseptic technique.  Patient is alert and oriented.   No specimen collected. PD exit site clean, dry and intact. Gentamycin and new dressing applied.   Hand-off given to the patient's nurse.  Bedside RN educated on PD machine and how to contact tech support when PD machine alarms.    10/09/22 2055  Vitals  Temp 98.3 F (36.8 C)  Temp Source Oral  BP (!) 143/78  MAP (mmHg) 99  BP Location Right Arm  BP Method Automatic  Patient Position (if appropriate) Lying  Pulse Rate 63  Pulse Rate Source Dinamap  Resp 18  MEWS COLOR  MEWS Score Color Green  Oxygen Therapy  SpO2 97 %  O2 Device Room Air  Pain Assessment  Pain Scale 0-10  Pain Score 6  MEWS Score  MEWS Temp 0  MEWS Systolic 0  MEWS Pulse 0  MEWS RR 0  MEWS LOC 0  MEWS Score 0      Hulen Shouts RN Kidney Dialysis Unit

## 2022-10-09 NOTE — Progress Notes (Signed)
Progress Note    Casey French  QMV:[id number] DOB: 07/30/1950  DOA: 10/07/2022 PCP: Jerl Mina, MD      Brief Narrative:    Medical records reviewed and are as summarized below:  Casey French is a 72 y.o. male  with medical history significant for CAD s/p stent angioplasty LAD and RCA, DM, HTN, A-fib on Eliquis, history of GI bleed , history of diverticulitis, ESRD on PD who still produces urine, hospitalized in June 2024 with altered mental status and hypertensive emergency related to missing dialysis for 4 days and required urgent dialysis at that time.  He presented to the hospital because of general weakness, vomiting, abdominal pain and chest pain.  His BP was 220/110 when EMS picked him up.  In the ED, his BP was 209/100.  CT abdomen showed severe pancolitis in the sigmoid colon.   He was admitted to the hospital for hypertensive urgency and sigmoid colon pancolitis.      Assessment/Plan:   Principal Problem:   Acute colitis Active Problems:   CAD S/P percutaneous coronary angioplasty   Chest pain   Elevated troponin   ESRD on peritoneal dialysis (HCC)   High anion gap metabolic acidosis   Hypertensive urgency, malignant   PAF (paroxysmal atrial fibrillation) (HCC) on chronic anticoagulation   DM type 2 (diabetes mellitus, type 2) (HCC)    Body mass index is 18.65 kg/m.   Acute sigmoid colon pancolitis: Improving.  Continue IV ceftriaxone and Flagyl.  Analgesics as needed for pain.     Hypertensive urgency: BP is better.  Continue antihypertensives.  (Amlodipine, losartan, metoprolol and isosorbide mononitrate)   ESRD on PD at home: He had peritoneal dialysis overnight.  Follow-up with nephrologist.   Hypokalemia: Replete potassium and monitor levels   Paroxysmal atrial fibrillation: Continue Eliquis   Mildly elevated and flat troponins and chest pain in a patient with CAD s/p RCA (2003) and LAD (2017) stents: Chest pain has  resolved.  2D echo is pending.  Continue Eliquis.     Type II DM: NovoLog as needed for hyperglycemia.    Diet Order             Diet clear liquid Room service appropriate? Yes; Fluid consistency: Thin  Diet effective now                            Consultants: Nephrologist  Procedures: None    Medications:    amLODipine  10 mg Oral Daily   apixaban  2.5 mg Oral BID   calcitRIOL  0.25 mcg Oral Daily   escitalopram  10 mg Oral Daily   gentamicin cream  1 Application Topical Daily   insulin aspart  0-5 Units Subcutaneous QHS   insulin aspart  0-6 Units Subcutaneous TID WC   isosorbide mononitrate  60 mg Oral Daily   losartan  100 mg Oral Daily   metoprolol succinate  100 mg Oral Daily   nitroGLYCERIN  1 inch Topical Q6H   pantoprazole  40 mg Oral Daily   potassium chloride  40 mEq Oral Once   rosuvastatin  40 mg Oral Daily   Continuous Infusions:  cefTRIAXone (ROCEPHIN)  IV 2 g (10/08/22 2359)   dialysis solution 1.5% low-MG/low-CA     metronidazole 500 mg (10/09/22 0924)   sodium bicarbonate 150 mEq in sterile water 1,150 mL infusion 30 mL/hr at 10/08/22 0029     Anti-infectives (From admission,  onward)    Start     Dose/Rate Route Frequency Ordered Stop   10/07/22 2230  cefTRIAXone (ROCEPHIN) 2 g in sodium chloride 0.9 % 100 mL IVPB        2 g 200 mL/hr over 30 Minutes Intravenous Every 24 hours 10/07/22 2219     10/07/22 2230  metroNIDAZOLE (FLAGYL) IVPB 500 mg        500 mg 100 mL/hr over 60 Minutes Intravenous Every 12 hours 10/07/22 2219                Family Communication/Anticipated D/C date and plan/Code Status   DVT prophylaxis: apixaban (ELIQUIS) tablet 2.5 mg Start: 10/07/22 2245 apixaban (ELIQUIS) tablet 2.5 mg     Code Status: Full Code  Family Communication: None Disposition Plan: Plan to discharge home   Status is: Inpatient Remains inpatient appropriate because: Acute colitis       Subjective:   No  acute events overnight.  He complains of feeling "queasy".  He has some abdominal pain but overall abdominal pain is better.  Objective:    Vitals:   10/09/22 0036 10/09/22 0110 10/09/22 0327 10/09/22 0741  BP:  (!) 143/83 136/78 (!) 144/63  Pulse:  71 65 64  Resp: (!) 21 (!) 24 19 18   Temp:  98.7 F (37.1 C) 99.1 F (37.3 C) 97.9 F (36.6 C)  TempSrc:  Oral Oral   SpO2:  96% 96% 96%  Weight:  57.3 kg    Height:       No data found.   Intake/Output Summary (Last 24 hours) at 10/09/2022 1003 Last data filed at 10/09/2022 0924 Gross per 24 hour  Intake 960 ml  Output 803 ml  Net 157 ml   Filed Weights   10/08/22 0458 10/08/22 1411 10/09/22 0110  Weight: 59.5 kg 54.7 kg 57.3 kg    Exam:  GEN: NAD SKIN: Warm and dry EYES: No pallor or icterus ENT: MMM CV: RRR PULM: CTA B ABD: soft, ND, NT, +BS, + peritoneal catheter in side of abdomen place CNS: AAO x 3, non focal EXT: No edema or tenderness       Data Reviewed:   I have personally reviewed following labs and imaging studies:  Labs: Labs show the following:   Basic Metabolic Panel: Recent Labs  Lab 10/07/22 1507 10/07/22 1510 10/08/22 0501 10/09/22 0812  NA 139  --  142 136  K 3.9  --  3.7 3.0*  CL 108  --  111 102  CO2 15*  --  21* 22  GLUCOSE 143*  --  92 87  BUN 42*  --  48* 43*  CREATININE 6.83*  --  7.09* 6.92*  CALCIUM 8.6*  --  8.3* 7.3*  MG  --  1.8  --   --    GFR Estimated Creatinine Clearance: 7.9 mL/min (A) (by C-G formula based on SCr of 6.92 mg/dL (H)). Liver Function Tests: Recent Labs  Lab 10/07/22 1507  AST 34  ALT 25  ALKPHOS 90  BILITOT 0.9  PROT 6.1*  ALBUMIN 3.1*   No results for input(s): "LIPASE", "AMYLASE" in the last 168 hours. No results for input(s): "AMMONIA" in the last 168 hours. Coagulation profile No results for input(s): "INR", "PROTIME" in the last 168 hours.  CBC: Recent Labs  Lab 10/07/22 1507 10/08/22 0501  WBC 9.4 9.7  NEUTROABS 8.7*  --    HGB 14.3 13.3  HCT 44.2 40.1  MCV 91.9 90.7  PLT  168 172   Cardiac Enzymes: No results for input(s): "CKTOTAL", "CKMB", "CKMBINDEX", "TROPONINI" in the last 168 hours. BNP (last 3 results) No results for input(s): "PROBNP" in the last 8760 hours. CBG: Recent Labs  Lab 10/07/22 2326 10/08/22 1239 10/08/22 1624 10/08/22 2157 10/09/22 0740  GLUCAP 101* 70 122* 116* 91   D-Dimer: No results for input(s): "DDIMER" in the last 72 hours. Hgb A1c: No results for input(s): "HGBA1C" in the last 72 hours. Lipid Profile: No results for input(s): "CHOL", "HDL", "LDLCALC", "TRIG", "CHOLHDL", "LDLDIRECT" in the last 72 hours. Thyroid function studies: No results for input(s): "TSH", "T4TOTAL", "T3FREE", "THYROIDAB" in the last 72 hours.  Invalid input(s): "FREET3" Anemia work up: No results for input(s): "VITAMINB12", "FOLATE", "FERRITIN", "TIBC", "IRON", "RETICCTPCT" in the last 72 hours. Sepsis Labs: Recent Labs  Lab 10/07/22 1507 10/08/22 0501  WBC 9.4 9.7  LATICACIDVEN 1.6  --     Microbiology No results found for this or any previous visit (from the past 240 hour(s)).  Procedures and diagnostic studies:  CT ABDOMEN PELVIS W CONTRAST  Result Date: 10/07/2022 CLINICAL DATA:  Abdominal pain with vomiting EXAM: CT ABDOMEN AND PELVIS WITH CONTRAST TECHNIQUE: Multidetector CT imaging of the abdomen and pelvis was performed using the standard protocol following bolus administration of intravenous contrast. RADIATION DOSE REDUCTION: This exam was performed according to the departmental dose-optimization program which includes automated exposure control, adjustment of the mA and/or kV according to patient size and/or use of iterative reconstruction technique. CONTRAST:  OMNIPAQUE IOHEXOL 300 MG/ML  SOLN COMPARISON:  CT chest abdomen and pelvis 04/18/2022 FINDINGS: Lower chest: There is atelectasis in the lung bases. Hepatobiliary: No focal liver abnormality is seen. No gallstones,  gallbladder wall thickening, or biliary dilatation. Pancreas: Unremarkable. No pancreatic ductal dilatation or surrounding inflammatory changes. Spleen: Normal in size without focal abnormality. Adrenals/Urinary Tract: There is a 2 cm cyst in the right kidney. There is mild nonspecific bilateral perinephric fat stranding. There is a subcentimeter rounded hypodensity in the lower pole the left kidney which is too small to characterize, but favored as cysts. There is no hydronephrosis. The adrenal glands and bladder are within normal limits. Stomach/Bowel: There is diffuse wall thickening of the colon, severe in the sigmoid colon. There is colonic diverticulosis as well without evidence for acute diverticulitis. The appendix is within normal limits. No bowel obstruction, pneumatosis or free air. The stomach is within normal limits. Vascular/Lymphatic: Aortic atherosclerosis. No enlarged abdominal or pelvic lymph nodes. Reproductive: Prostate is unremarkable. Other: No abdominal wall hernia or abnormality. No abdominopelvic ascites. Musculoskeletal: No acute or significant osseous findings. Left hip arthroplasty is present. IMPRESSION: 1. Pancolitis, severe in the sigmoid colon. 2. Colonic diverticulosis without evidence for acute diverticulitis. 3. Bilateral perinephric fat stranding, nonspecific. Correlate clinically for infection. Electronically Signed   By: Darliss Cheney M.D.   On: 10/07/2022 19:31   DG Chest Port 1 View  Result Date: 10/07/2022 CLINICAL DATA:  Weakness vomiting and chest pain EXAM: PORTABLE CHEST 1 VIEW COMPARISON:  06/26/2022 FINDINGS: No acute airspace disease or pleural effusion. Linear atelectasis or scar at the right base. Stable cardiomediastinal silhouette. No pneumothorax IMPRESSION: No active disease. Linear atelectasis or scar at the right base. Electronically Signed   By: Jasmine Pang M.D.   On: 10/07/2022 16:44               LOS: 2 days   Eloni Darius  [facility]   Pager on [email or web address]. If 7PM-7AM, please  contact night-coverage at [email or web address]     10/09/2022, 10:03 AM

## 2022-10-09 NOTE — Progress Notes (Signed)
  Echocardiogram 2D Echocardiogram has been performed.  Casey French 10/09/2022, 10:11 AM

## 2022-10-09 NOTE — Plan of Care (Signed)
Problem: Education: Goal: Knowledge of General Education information will improve Description: Including pain rating scale, medication(s)/side effects and non-pharmacologic comfort measures Outcome: Progressing   Problem: Health Behavior/Discharge Planning: Goal: Ability to manage health-related needs will improve Outcome: Progressing   Problem: Clinical Measurements: Goal: Ability to maintain clinical measurements within normal limits will improve Outcome: Progressing Goal: Respiratory complications will improve Outcome: Progressing   Problem: Activity: Goal: Risk for activity intolerance will decrease Outcome: Progressing   Problem: Pain Managment: Goal: General experience of comfort will improve Outcome: Progressing

## 2022-10-09 NOTE — Progress Notes (Signed)
Peritoneal Dialysis post treatment note  PD treatment completed.   Patient tolerated treatment well.   PD effluent is clear/yellow with fibrin. No specimen collected. PD exit site clean, dry and intact.   Patient is awake, oriented and in no acute distress. Report given to bedside nurse.  Post treatment VS:see table below  Total UF removed:    Post treatment weight: 57.3kg bed scale    10/09/22 0110  Vitals  Temp 98.7 F (37.1 C)  Temp Source Oral  BP (!) 143/83  MAP (mmHg) 100  BP Location Left Arm  BP Method Automatic  Patient Position (if appropriate) Lying  Pulse Rate 71  Pulse Rate Source Dinamap  ECG Heart Rate 72  Resp (!) 24  Level of Consciousness  Level of Consciousness Alert  MEWS COLOR  MEWS Score Color Green  Oxygen Therapy  SpO2 96 %  O2 Device Room Air  Patient Activity (if Appropriate) In bed  Pulse Oximetry Type Intermittent  MEWS Score  MEWS Temp 0  MEWS Systolic 0  MEWS Pulse 0  MEWS RR 1  MEWS LOC 0  MEWS Score 1     Hulen Shouts RN Kidney Dialysis Unit

## 2022-10-09 NOTE — Progress Notes (Signed)
[facility]  ROUNDING NOTE   Subjective:   Peritoneal dialysis yesterday during the day. Tolerated treatment well.   Patient has eaten some broth this morning and is tolerating it. Denies any more nausea, vomiting or diarrhea.   Objective:  Vital signs in last 24 hours:  Temp:  [97.9 F (36.6 C)-99.1 F (37.3 C)] 98 F (36.7 C) (09/14 1035) Pulse Rate:  [58-72] 58 (09/14 1035) Resp:  [15-24] 18 (09/14 1035) BP: (136-172)/(63-128) 152/77 (09/14 1035) SpO2:  [95 %-98 %] 95 % (09/14 1035) Weight:  [54.7 kg-57.3 kg] 57.3 kg (09/14 0110)  Weight change: -6.535 kg Filed Weights   10/08/22 0458 10/08/22 1411 10/09/22 0110  Weight: 59.5 kg 54.7 kg 57.3 kg    Intake/Output: I/O last 3 completed shifts: In: 1294.5 [P.O.:960; I.V.:134.5; IV Piggyback:200] Out: 1228 [Urine:1150; Other:78]   Intake/Output this shift:  Total I/O In: 100 [IV Piggyback:100] Out: 225 [Urine:225]  Physical Exam: General: No acute distress  Head: Normocephalic, atraumatic. Moist oral mucosal membranes  Neck: Supple  Lungs:  Clear to auscultation, normal effort  Heart: regular  Abdomen:  Soft, mild diffuse tenderness.  Extremities: No peripheral edema.  Neurologic: Awake, alert, following commands  Skin: No acute rash  Access: PD catheter in place    Basic Metabolic Panel: Recent Labs  Lab 10/07/22 1507 10/07/22 1510 10/08/22 0501 10/09/22 0812  NA 139  --  142 136  K 3.9  --  3.7 3.0*  CL 108  --  111 102  CO2 15*  --  21* 22  GLUCOSE 143*  --  92 87  BUN 42*  --  48* 43*  CREATININE 6.83*  --  7.09* 6.92*  CALCIUM 8.6*  --  8.3* 7.3*  MG  --  1.8  --  1.5*    Liver Function Tests: Recent Labs  Lab 10/07/22 1507  AST 34  ALT 25  ALKPHOS 90  BILITOT 0.9  PROT 6.1*  ALBUMIN 3.1*   No results for input(s): "LIPASE", "AMYLASE" in the last 168 hours. No results for input(s): "AMMONIA" in the last 168 hours.  CBC: Recent Labs  Lab 10/07/22 1507 10/08/22 0501   WBC 9.4 9.7  NEUTROABS 8.7*  --   HGB 14.3 13.3  HCT 44.2 40.1  MCV 91.9 90.7  PLT 168 172    Cardiac Enzymes: No results for input(s): "CKTOTAL", "CKMB", "CKMBINDEX", "TROPONINI" in the last 168 hours.  BNP: Invalid input(s): "POCBNP"  CBG: Recent Labs  Lab 10/07/22 2326 10/08/22 1239 10/08/22 1624 10/08/22 2157 10/09/22 0740  GLUCAP 101* 70 122* 116* 91    Microbiology: Results for orders placed or performed during the hospital encounter of 06/26/22  SARS Coronavirus 2 by RT PCR (hospital order, performed in [facility] hospital lab) *cepheid single result test* Anterior Nasal Swab     Status: None   Collection Time: 06/26/22  5:13 PM   Specimen: Anterior Nasal Swab  Result Value Ref Range Status   SARS Coronavirus 2 by RT PCR NEGATIVE NEGATIVE Final    Comment: (NOTE) SARS-CoV-2 target nucleic acids are NOT DETECTED.  The SARS-CoV-2 RNA is generally detectable in upper and lower respiratory specimens during the acute phase of infection. The lowest concentration of SARS-CoV-2 viral copies this assay can detect is 250 copies / mL. A negative result does not preclude SARS-CoV-2 infection and should not be used as the sole basis for treatment or other patient management decisions.  A negative result may occur with improper specimen  collection / handling, submission of specimen other than nasopharyngeal swab, presence of viral mutation(s) within the areas targeted by this assay, and inadequate number of viral copies (<250 copies / mL). A negative result must be combined with clinical observations, patient history, and epidemiological information.  Fact Sheet for Patients:   RoadLapTop.co.za  Fact Sheet for Healthcare Providers: http://kim-miller.com/  This test is not yet approved or  cleared by the [place] FDA and has been authorized for detection and/or diagnosis of SARS-CoV-2 by FDA under an Emergency Use  Authorization (EUA).  This EUA will remain in effect (meaning this test can be used) for the duration of the COVID-19 declaration under Section 564(b)(1) of the Act, 21 U.S.C. section 360bbb-3(b)(1), unless the authorization is terminated or revoked sooner.  Performed at [facility], [address] [place], [place] [address]   MRSA Next Gen by PCR, Nasal     Status: None   Collection Time: 06/26/22  5:31 PM   Specimen: Nasal Mucosa; Nasal Swab  Result Value Ref Range Status   MRSA by PCR Next Gen NOT DETECTED NOT DETECTED Final    Comment: (NOTE) The GeneXpert MRSA Assay (FDA approved for NASAL specimens only), is one component of a comprehensive MRSA colonization surveillance program. It is not intended to diagnose MRSA infection nor to guide or monitor treatment for MRSA infections. Test performance is not FDA approved in patients less than 79 years old. Performed at [facility], [address] [place], [place] [address]     Coagulation Studies: No results for input(s): "LABPROT", "INR" in the last 72 hours.  Urinalysis: Recent Labs    10/07/22 2013  COLORURINE STRAW*  LABSPEC 1.012  PHURINE 8.0  GLUCOSEU >=500*  HGBUR SMALL*  BILIRUBINUR NEGATIVE  KETONESUR 5*  PROTEINUR >=300*  NITRITE NEGATIVE  LEUKOCYTESUR NEGATIVE      Imaging: CT ABDOMEN PELVIS W CONTRAST  Result Date: 10/07/2022 CLINICAL DATA:  Abdominal pain with vomiting EXAM: CT ABDOMEN AND PELVIS WITH CONTRAST TECHNIQUE: Multidetector CT imaging of the abdomen and pelvis was performed using the standard protocol following bolus administration of intravenous contrast. RADIATION DOSE REDUCTION: This exam was performed according to the departmental dose-optimization program which includes automated exposure control, adjustment of the mA and/or kV according to patient size and/or use of iterative reconstruction technique. CONTRAST:  OMNIPAQUE IOHEXOL 300 MG/ML  SOLN COMPARISON:   CT chest abdomen and pelvis 04/18/2022 FINDINGS: Lower chest: There is atelectasis in the lung bases. Hepatobiliary: No focal liver abnormality is seen. No gallstones, gallbladder wall thickening, or biliary dilatation. Pancreas: Unremarkable. No pancreatic ductal dilatation or surrounding inflammatory changes. Spleen: Normal in size without focal abnormality. Adrenals/Urinary Tract: There is a 2 cm cyst in the right kidney. There is mild nonspecific bilateral perinephric fat stranding. There is a subcentimeter rounded hypodensity in the lower pole the left kidney which is too small to characterize, but favored as cysts. There is no hydronephrosis. The adrenal glands and bladder are within normal limits. Stomach/Bowel: There is diffuse wall thickening of the colon, severe in the sigmoid colon. There is colonic diverticulosis as well without evidence for acute diverticulitis. The appendix is within normal limits. No bowel obstruction, pneumatosis or free air. The stomach is within normal limits. Vascular/Lymphatic: Aortic atherosclerosis. No enlarged abdominal or pelvic lymph nodes. Reproductive: Prostate is unremarkable. Other: No abdominal wall hernia or abnormality. No abdominopelvic ascites. Musculoskeletal: No acute or significant osseous findings. Left hip arthroplasty is present. IMPRESSION: 1. Pancolitis, severe in  the sigmoid colon. 2. Colonic diverticulosis without evidence for acute diverticulitis. 3. Bilateral perinephric fat stranding, nonspecific. Correlate clinically for infection. Electronically Signed   By: Darliss Cheney M.D.   On: 10/07/2022 19:31   DG Chest Port 1 View  Result Date: 10/07/2022 CLINICAL DATA:  Weakness vomiting and chest pain EXAM: PORTABLE CHEST 1 VIEW COMPARISON:  06/26/2022 FINDINGS: No acute airspace disease or pleural effusion. Linear atelectasis or scar at the right base. Stable cardiomediastinal silhouette. No pneumothorax IMPRESSION: No active disease. Linear atelectasis  or scar at the right base. Electronically Signed   By: Jasmine Pang M.D.   On: 10/07/2022 16:44     Medications:    cefTRIAXone (ROCEPHIN)  IV Stopped (10/09/22 0029)   dialysis solution 1.5% low-MG/low-CA     metronidazole 500 mg (10/09/22 0924)   sodium bicarbonate 150 mEq in sterile water 1,150 mL infusion 30 mL/hr at 10/08/22 0029    amLODipine  10 mg Oral Daily   apixaban  2.5 mg Oral BID   calcitRIOL  0.25 mcg Oral Daily   escitalopram  10 mg Oral Daily   gentamicin cream  1 Application Topical Daily   insulin aspart  0-5 Units Subcutaneous QHS   insulin aspart  0-6 Units Subcutaneous TID WC   isosorbide mononitrate  60 mg Oral Daily   losartan  100 mg Oral Daily   metoprolol succinate  100 mg Oral Daily   nitroGLYCERIN  1 inch Topical Q6H   pantoprazole  40 mg Oral Daily   rosuvastatin  40 mg Oral Daily   acetaminophen **OR** acetaminophen, HYDROmorphone (DILAUDID) injection, nitroGLYCERIN, ondansetron **OR** ondansetron (ZOFRAN) IV  Assessment/ Plan:   Casey French is a 72 y.o. 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  ESRD on peritoneal dialysis.  Patient reports that he did not perform treatment for 4 days prior to admission.  - Peritoneal dialysis for tonight.    2.  Pancolitis.   - empiric ceftriaxone and metronidazole.   - supportive treatment.   3.  Acute metabolic acidosis.  - discontinue bicarb infusion. No anion gap. Serum bicarbonate improved to 22.   4.  Hypertension: 152/77 - Continue amlodipine, losartan, isosorbide mononitrate and metoprolol  5.  Secondary hyperparathyroidism. - Continue Calcitrol 0.25 mcg p.o. daily.  6. Diabetes mellitus type II with chronic kidney disease - Continue glucose control.    LOS:  2 Sadako Cegielski 9/14/202410:50 AM

## 2022-10-09 NOTE — Consult Note (Signed)
[facility] Cardiology  CARDIOLOGY CONSULT NOTE  Patient ID: PISTOL GRZESKOWIAK MRN: [id number] DOB/AGE: 03-27-1950 72 y.o.  Admit date: 10/07/2022 Referring Physician Para March Primary Physician [facility] Primary Cardiologist Marijah Larranaga Reason for Consultation coronary artery disease  HPI: 72 year old gentleman referred for evaluation of coronary artery disease.  The patient has known history of coronary artery disease, paroxysmal atrial fibrillation, and essential hypertension, with end-stage renal disease on peritoneal dialysis.  Patient was in his usual state of health until 1 day prior to admission, developed nausea, vomiting, abdominal pain, and diarrhea.  He presented to [facility] ED where abdominal CT revealed pancolitis, severe in the sigmoid colon.  The patient has known coronary artery disease, history of inferior STEMI, Cypher stent RCA 11/22/2001.cardiac catheterization 09/03/2003 revealed occluded distal RCA with residual 60% stenosis proximal LAD.  The patient underwent PCI with 3.5 x 12 mm Xience Alpine DES mid LAD 07/21/2015.  ETT Myoview 04/02/2021 revealed mild inferior wall ischemia consistent with known coronary artery disease.  The patient currently denies chest pain.  ECG on admission revealed sinus rhythm at 94 bpm with left ventricular hypertrophy.  Troponin borderline elevated 39, 49, likely demand supply ischemia secondary to pancolitis and hypertensive urgency.  Blood pressure on admission 186/100, now much improved, 152/77.  2D echocardiogram performed earlier today revealed normal left ventricular function, with LVEF 60-65% with trivial valvular insufficiencies.  Patient has paroxysmal atrial fibrillation, on low-dose Eliquis 2.5 mg twice daily, with history of chronic anemia, followed by hematology, undergoes chronic iron infusions.  Review of systems complete and found to be negative unless listed above     Past Medical History:  Diagnosis Date   Acute diverticulitis 06/14/2014    Anemia    Anginal pain (HCC)    Aortic atherosclerosis (HCC)    Atrial fibrillation (HCC)    CKD (chronic kidney disease) stage 3, GFR 30-59 ml/min (HCC)    Coronary artery disease    Depression    Diabetes mellitus without complication (HCC)    Diverticulosis    GERD (gastroesophageal reflux disease)    GI bleed 05/15/2019   Gout    Heart murmur    Hematemesis 05/15/2019   High cholesterol    Hypertension    STEMI (ST elevation myocardial infarction) (HCC) 2003   Inferior    Past Surgical History:  Procedure Laterality Date   CAPD INSERTION N/A 08/13/2020   Procedure: LAPAROSCOPIC INSERTION CONTINUOUS AMBULATORY PERITONEAL DIALYSIS  (CAPD) CATHETER;  Surgeon: Campbell Lerner, MD;  Location: [facility] ORS;  Service: General;  Laterality: N/A;   CARDIAC CATHETERIZATION     CATARACT EXTRACTION W/PHACO Right 07/05/2019   Procedure: CATARACT EXTRACTION PHACO AND INTRAOCULAR LENS PLACEMENT (IOC) RIGHT DIABETIC;  Surgeon: Galen Manila, MD;  Location: [facility] ORS;  Service: Ophthalmology;  Laterality: Right;  US00:59.3 CDE8.41 LOT [id number] h   COLONOSCOPY WITH PROPOFOL N/A 04/01/2017   Procedure: COLONOSCOPY WITH PROPOFOL;  Surgeon: Christena Deem, MD;  Location: [facility] ENDOSCOPY;  Service: Endoscopy;  Laterality: N/A;   CORONARY ANGIOPLASTY WITH STENT PLACEMENT  10/2001   RCA   CORONARY ANGIOPLASTY WITH STENT PLACEMENT  07/2015   LAD   right hand surgery  2015   3 screws in right hand   TOTAL HIP ARTHROPLASTY Left 04/22/2020   Procedure: TOTAL HIP ARTHROPLASTY ANTERIOR APPROACH;  Surgeon: Kennedy Bucker, MD;  Location: [facility] ORS;  Service: Orthopedics;  Laterality: Left;    Medications Prior to Admission  Medication Sig Dispense Refill Last Dose   allopurinol (ZYLOPRIM) 100 MG tablet Take 100 mg  by mouth daily.      amLODipine (NORVASC) 10 MG tablet Take 1 tablet (10 mg total) by mouth daily. 30 tablet 1    apixaban (ELIQUIS) 2.5 MG TABS tablet Take 2.5 mg by mouth 2 (two) times daily.       aspirin 81 MG EC tablet Take 81 mg by mouth daily.      calcitRIOL (ROCALTROL) 0.25 MCG capsule Take 1 capsule (0.25 mcg total) by mouth daily. 30 capsule 2    escitalopram (LEXAPRO) 10 MG tablet Take 10 mg by mouth daily.      folic acid (FOLVITE) 1 MG tablet TAKE 2 TABLETS(2 MG) BY MOUTH DAILY (Patient taking differently: 1 mg. Take 1 tablet daily) 180 tablet 2    gabapentin (NEURONTIN) 300 MG capsule Take 300 mg by mouth at bedtime.  11    hydrocortisone (ANUSOL-HC) 2.5 % rectal cream Place 1 application rectally 2 (two) times daily as needed for hemorrhoids or anal itching.      isosorbide mononitrate (IMDUR) 60 MG 24 hr tablet Take 60 mg by mouth daily.      losartan (COZAAR) 100 MG tablet Take 1 tablet (100 mg total) by mouth daily. 30 tablet 1    meclizine (ANTIVERT) 25 MG tablet Take 1 tablet (25 mg total) by mouth 2 (two) times daily. 30 tablet 0    methocarbamol (ROBAXIN) 500 MG tablet Take 500 mg by mouth 3 (three) times daily as needed.      metoprolol succinate (TOPROL-XL) 100 MG 24 hr tablet Take 100 mg by mouth daily. Take with or immediately following a meal.      nitroGLYCERIN (NITROSTAT) 0.4 MG SL tablet Place 0.4 mg under the tongue every 5 (five) minutes as needed for chest pain.      omeprazole (PRILOSEC) 20 MG capsule Take 20 mg by mouth daily.      psyllium (METAMUCIL) 58.6 % packet Take 1 packet by mouth 2 (two) times daily.      rosuvastatin (CRESTOR) 40 MG tablet Take 40 mg by mouth daily.      Vitamin D, Ergocalciferol, (DRISDOL) 1.25 MG (50000 UNIT) CAPS capsule Take 50,000 Units by mouth once a week.      Social History   Socioeconomic History   Marital status: Married    Spouse name: Sherri   Number of children: 2   Years of education: Not on file   Highest education level: Not on file  Occupational History   Not on file  Tobacco Use   Smoking status: Former    Current packs/day: 0.00    Types: Cigarettes    Quit date: 03/24/2001    Years since  quitting: 21.5   Smokeless tobacco: Never  Vaping Use   Vaping status: Never Used  Substance and Sexual Activity   Alcohol use: No    Alcohol/week: 0.0 standard drinks of alcohol   Drug use: No   Sexual activity: Not on file  Other Topics Concern   Not on file  Social History Narrative   Not on file   Social Determinants of Health   Financial Resource Strain: Not on file  Food Insecurity: No Food Insecurity (10/07/2022)   Hunger Vital Sign    Worried About Running Out of Food in the Last Year: Never true    Ran Out of Food in the Last Year: Never true  Transportation Needs: No Transportation Needs (10/07/2022)   PRAPARE - Administrator, Civil Service (Medical): No  Lack of Transportation (Non-Medical): No  Physical Activity: Not on file  Stress: Not on file  Social Connections: Not on file  Intimate Partner Violence: Not At Risk (10/07/2022)   Humiliation, Afraid, Rape, and Kick questionnaire    Fear of Current or Ex-Partner: No    Emotionally Abused: No    Physically Abused: No    Sexually Abused: No    Family History  Problem Relation Age of Onset   Heart disease Mother    Heart disease Father    CAD Other       Review of systems complete and found to be negative unless listed above      PHYSICAL EXAM  General: Well developed, well nourished, in no acute distress HEENT:  Normocephalic and atramatic Neck:  No JVD.  Lungs: Clear bilaterally to auscultation and percussion. Heart: HRRR . Normal S1 and S2 without gallops or murmurs.  Abdomen: Bowel sounds are positive, abdomen soft and non-tender  Msk:  Back normal, normal gait. Normal strength and tone for age. Extremities: No clubbing, cyanosis or edema.   Neuro: Alert and oriented X 3. Psych:  Good affect, responds appropriately  Labs:   Lab Results  Component Value Date   WBC 9.7 10/08/2022   HGB 13.3 10/08/2022   HCT 40.1 10/08/2022   MCV 90.7 10/08/2022   PLT 172 10/08/2022     Recent Labs  Lab 10/07/22 1507 10/08/22 0501 10/09/22 0812  NA 139   < > 136  K 3.9   < > 3.0*  CL 108   < > 102  CO2 15*   < > 22  BUN 42*   < > 43*  CREATININE 6.83*   < > 6.92*  CALCIUM 8.6*   < > 7.3*  PROT 6.1*  --   --   BILITOT 0.9  --   --   ALKPHOS 90  --   --   ALT 25  --   --   AST 34  --   --   GLUCOSE 143*   < > 87   < > = values in this interval not displayed.   Lab Results  Component Value Date   CKTOTAL 198 03/23/2013   CKMB 4.6 (H) 03/23/2013   TROPONINI <0.03 06/26/2018    Lab Results  Component Value Date   CHOL 142 11/10/2016   CHOL 194 09/27/2012   Lab Results  Component Value Date   HDL 45 11/10/2016   HDL 49 09/27/2012   Lab Results  Component Value Date   LDLCALC 75 11/10/2016   LDLCALC 98 09/27/2012   Lab Results  Component Value Date   TRIG 109 11/10/2016   TRIG 233 (H) 09/27/2012   Lab Results  Component Value Date   CHOLHDL 3.2 11/10/2016   No results found for: "LDLDIRECT"    Radiology: ECHOCARDIOGRAM COMPLETE  Result Date: 10/09/2022    ECHOCARDIOGRAM REPORT   Patient Name:   MARRELL TRUCKS Date of Exam: 10/09/2022 Medical Rec #:  [id number]           Height:       69.0 in Accession #:    [id number]          Weight:       126.3 lb Date of Birth:  07-Apr-1950           BSA:          1.700 m Patient Age:    44 years  BP:           136/78 mmHg Patient Gender: M                   HR:           62 bpm. Exam Location:  [facility] Procedure: 2D Echo Indications:     Chest Pain R07.9  History:         Patient has prior history of Echocardiogram examinations, most                  recent 12/03/2020.  Sonographer:     Overton Mam RDCS, FASE Referring Phys:  [id number] Andris Baumann Diagnosing Phys: Marcina Millard MD IMPRESSIONS  1. Left ventricular ejection fraction, by estimation, is 60 to 65%. The left ventricle has normal function. The left ventricle has no regional wall motion abnormalities. Left ventricular diastolic  parameters are consistent with Grade I diastolic dysfunction (impaired relaxation).  2. Right ventricular systolic function is normal. The right ventricular size is normal.  3. The mitral valve is normal in structure. Trivial mitral valve regurgitation. No evidence of mitral stenosis.  4. The aortic valve is normal in structure. Aortic valve regurgitation is not visualized. No aortic stenosis is present.  5. The inferior vena cava is normal in size with greater than 50% respiratory variability, suggesting right atrial pressure of 3 mmHg. FINDINGS  Left Ventricle: Left ventricular ejection fraction, by estimation, is 60 to 65%. The left ventricle has normal function. The left ventricle has no regional wall motion abnormalities. The left ventricular internal cavity size was normal in size. There is  no left ventricular hypertrophy. Left ventricular diastolic parameters are consistent with Grade I diastolic dysfunction (impaired relaxation). Right Ventricle: The right ventricular size is normal. No increase in right ventricular wall thickness. Right ventricular systolic function is normal. Left Atrium: Left atrial size was normal in size. Right Atrium: Right atrial size was normal in size. Pericardium: There is no evidence of pericardial effusion. Mitral Valve: The mitral valve is normal in structure. Trivial mitral valve regurgitation. No evidence of mitral valve stenosis. Tricuspid Valve: The tricuspid valve is normal in structure. Tricuspid valve regurgitation is trivial. No evidence of tricuspid stenosis. Aortic Valve: The aortic valve is normal in structure. Aortic valve regurgitation is not visualized. No aortic stenosis is present. Aortic valve peak gradient measures 13.0 mmHg. Pulmonic Valve: The pulmonic valve was normal in structure. Pulmonic valve regurgitation is not visualized. No evidence of pulmonic stenosis. Aorta: The aortic root is normal in size and structure. Venous: The inferior vena cava is normal  in size with greater than 50% respiratory variability, suggesting right atrial pressure of 3 mmHg. IAS/Shunts: No atrial level shunt detected by color flow Doppler.  LEFT VENTRICLE PLAX 2D LVIDd:         3.80 cm   Diastology LVIDs:         2.20 cm   LV e' medial:    8.05 cm/s LV PW:         1.10 cm   LV E/e' medial:  11.8 LV IVS:        1.20 cm   LV e' lateral:   14.70 cm/s LVOT diam:     1.80 cm   LV E/e' lateral: 6.5 LV SV:         71 LV SV Index:   42 LVOT Area:     2.54 cm  RIGHT VENTRICLE RV Basal diam:  2.20 cm RV  S prime:     24.80 cm/s TAPSE (M-mode): 2.4 cm LEFT ATRIUM             Index        RIGHT ATRIUM           Index LA diam:        3.20 cm 1.88 cm/m   RA Area:     14.60 cm LA Vol (A2C):   29.8 ml 17.53 ml/m  RA Volume:   36.60 ml  21.54 ml/m LA Vol (A4C):   50.9 ml 29.95 ml/m LA Biplane Vol: 41.4 ml 24.36 ml/m  AORTIC VALVE                 PULMONIC VALVE AV Area (Vmax): 1.97 cm     PV Vmax:       1.14 m/s AV Vmax:        180.00 cm/s  PV Peak grad:  5.2 mmHg AV Peak Grad:   13.0 mmHg LVOT Vmax:      139.00 cm/s LVOT Vmean:     91.900 cm/s LVOT VTI:       0.278 m  AORTA Ao Root diam: 4.00 cm Ao Asc diam:  3.30 cm MITRAL VALVE                TRICUSPID VALVE MV Area (PHT): 2.55 cm     TR Peak grad:   26.0 mmHg MV Decel Time: 297 msec     TR Vmax:        255.00 cm/s MV E velocity: 94.90 cm/s MV A velocity: 108.00 cm/s  SHUNTS MV E/A ratio:  0.88         Systemic VTI:  0.28 m                             Systemic Diam: 1.80 cm Marcina Millard MD Electronically signed by Marcina Millard MD Signature Date/Time: 10/09/2022/11:12:11 AM    Final    CT ABDOMEN PELVIS W CONTRAST  Result Date: 10/07/2022 CLINICAL DATA:  Abdominal pain with vomiting EXAM: CT ABDOMEN AND PELVIS WITH CONTRAST TECHNIQUE: Multidetector CT imaging of the abdomen and pelvis was performed using the standard protocol following bolus administration of intravenous contrast. RADIATION DOSE REDUCTION: This exam was performed  according to the departmental dose-optimization program which includes automated exposure control, adjustment of the mA and/or kV according to patient size and/or use of iterative reconstruction technique. CONTRAST:  OMNIPAQUE IOHEXOL 300 MG/ML  SOLN COMPARISON:  CT chest abdomen and pelvis 04/18/2022 FINDINGS: Lower chest: There is atelectasis in the lung bases. Hepatobiliary: No focal liver abnormality is seen. No gallstones, gallbladder wall thickening, or biliary dilatation. Pancreas: Unremarkable. No pancreatic ductal dilatation or surrounding inflammatory changes. Spleen: Normal in size without focal abnormality. Adrenals/Urinary Tract: There is a 2 cm cyst in the right kidney. There is mild nonspecific bilateral perinephric fat stranding. There is a subcentimeter rounded hypodensity in the lower pole the left kidney which is too small to characterize, but favored as cysts. There is no hydronephrosis. The adrenal glands and bladder are within normal limits. Stomach/Bowel: There is diffuse wall thickening of the colon, severe in the sigmoid colon. There is colonic diverticulosis as well without evidence for acute diverticulitis. The appendix is within normal limits. No bowel obstruction, pneumatosis or free air. The stomach is within normal limits. Vascular/Lymphatic: Aortic atherosclerosis. No enlarged abdominal or pelvic lymph nodes. Reproductive: Prostate is unremarkable. Other:  No abdominal wall hernia or abnormality. No abdominopelvic ascites. Musculoskeletal: No acute or significant osseous findings. Left hip arthroplasty is present. IMPRESSION: 1. Pancolitis, severe in the sigmoid colon. 2. Colonic diverticulosis without evidence for acute diverticulitis. 3. Bilateral perinephric fat stranding, nonspecific. Correlate clinically for infection. Electronically Signed   By: Darliss Cheney M.D.   On: 10/07/2022 19:31   DG Chest Port 1 View  Result Date: 10/07/2022 CLINICAL DATA:  Weakness vomiting  and chest pain EXAM: PORTABLE CHEST 1 VIEW COMPARISON:  06/26/2022 FINDINGS: No acute airspace disease or pleural effusion. Linear atelectasis or scar at the right base. Stable cardiomediastinal silhouette. No pneumothorax IMPRESSION: No active disease. Linear atelectasis or scar at the right base. Electronically Signed   By: Jasmine Pang M.D.   On: 10/07/2022 16:44    EKG: Sinus rhythm with left ventricular perch if he and 94 bpm  ASSESSMENT AND PLAN:   1.  Coronary artery disease, history of inferior STEMI in Cypher DES RCA 10/29 2003, DES mid LAD 07/21/2015, currently without chest pain or ischemic ECG changes 2.  Borderline elevated troponin, likely demand supply ischemia, secondary to pancolitis and hypertensive urgency 3.  Pancolitis, slowly improving 4.  Hypertensive urgency, blood pressure much improved on metoprolol succinate, losartan, and amlodipine 5.  Paroxysmal atrial fibrillation, on low-dose Eliquis, with chronic anemia, followed by hematology, undergoes chronic iron infusions 6.  Hyperlipidemia, on rosuvastatin  Recommendations  1.  Agree with current plan 2.  Continue low-dose Eliquis for stroke prevention 3.  Continue metoprolol succinate, amlodipine, and losartan for blood pressure control 4.  Continue isosorbide mononitrate, DC Nitropaste 5.  No further cardiac diagnostics at this time  Signed: Marcina Millard MD,PhD, [facility] 10/09/2022, 11:22 AM

## 2022-10-10 DIAGNOSIS — K529 Noninfective gastroenteritis and colitis, unspecified: Secondary | ICD-10-CM | POA: Diagnosis not present

## 2022-10-10 LAB — BASIC METABOLIC PANEL
Anion gap: 13 (ref 5–15)
BUN: 39 mg/dL — ABNORMAL HIGH (ref 8–23)
CO2: 22 mmol/L (ref 22–32)
Calcium: 7.6 mg/dL — ABNORMAL LOW (ref 8.9–10.3)
Chloride: 102 mmol/L (ref 98–111)
Creatinine, Ser: 6.41 mg/dL — ABNORMAL HIGH (ref 0.61–1.24)
GFR, Estimated: 9 mL/min — ABNORMAL LOW (ref >60)
Glucose, Bld: 101 mg/dL — ABNORMAL HIGH (ref 70–99)
Potassium: 3 mmol/L — ABNORMAL LOW (ref 3.5–5.1)
Sodium: 137 mmol/L (ref 135–145)

## 2022-10-10 LAB — MAGNESIUM: Magnesium: 2 mg/dL (ref 1.7–2.4)

## 2022-10-10 LAB — GLUCOSE, CAPILLARY
Glucose-Capillary: 105 mg/dL — ABNORMAL HIGH (ref 70–99)
Glucose-Capillary: 97 mg/dL (ref 70–99)
Glucose-Capillary: 152 mg/dL — ABNORMAL HIGH (ref 70–99)

## 2022-10-10 MED ORDER — POTASSIUM CHLORIDE CRYS ER 20 MEQ PO TBCR
40.0000 meq | EXTENDED_RELEASE_TABLET | Freq: Two times a day (BID) | ORAL | Status: AC
  Administered 2022-10-10: 40 meq via ORAL
  Filled 2022-10-10: qty 2

## 2022-10-10 MED ORDER — SODIUM CHLORIDE 0.9 % IV SOLN
12.5000 mg | Freq: Four times a day (QID) | INTRAVENOUS | Status: DC | PRN
  Administered 2022-10-10: 12.5 mg via INTRAVENOUS
  Filled 2022-10-10: qty 0.5
  Filled 2022-10-10: qty 12.5

## 2022-10-10 MED ORDER — CHLORHEXIDINE GLUCONATE CLOTH 2 % EX PADS
6.0000 | MEDICATED_PAD | Freq: Every day | CUTANEOUS | Status: DC
  Administered 2022-10-10 – 2022-10-11 (×2): 6 via TOPICAL

## 2022-10-10 NOTE — Progress Notes (Addendum)
in the last 168 hours.  CBC: Recent Labs  Lab 10/07/22 1507 10/08/22 0501  WBC 9.4 9.7  NEUTROABS 8.7*  --   HGB 14.3 13.3  HCT 44.2 40.1  MCV 91.9 90.7  PLT 168 172   Cardiac Enzymes: No results for input(s): "CKTOTAL", "CKMB", "CKMBINDEX", "TROPONINI" in the last 168 hours. BNP (last 3 results) No results for input(s): "PROBNP" in the last 8760 hours. CBG: Recent Labs  Lab 10/08/22 2157 10/09/22 0740 10/09/22 1209 10/09/22 1648 10/09/22 2215  GLUCAP 116* 91 92 95 143*   D-Dimer: No results for input(s): "DDIMER" in the last 72 hours. Hgb A1c: No results for input(s): "HGBA1C" in the last 72 hours. Lipid Profile: No results for input(s): "CHOL", "HDL", "LDLCALC", "TRIG", "CHOLHDL", "LDLDIRECT" in the last 72 hours. Thyroid function studies: No results for input(s): "TSH", "T4TOTAL", "T3FREE", "THYROIDAB" in the last 72 hours.  Invalid input(s): "FREET3" Anemia work up: No results for input(s): "VITAMINB12", "FOLATE", "FERRITIN", "TIBC", "IRON", "RETICCTPCT" in the last 72 hours. Sepsis Labs: Recent Labs  Lab 10/07/22 1507 10/08/22 0501  WBC 9.4 9.7  LATICACIDVEN 1.6  --     Microbiology No results found for this or any previous visit (from the past 240 hour(s)).  Procedures and diagnostic studies:  ECHOCARDIOGRAM COMPLETE  Result Date: 10/09/2022    ECHOCARDIOGRAM REPORT   Patient Name:   Casey French Date of Exam: 10/09/2022 Medical Rec #:  [id number]           Height:       69.0 in Accession #:    [id number]          Weight:       126.3 lb Date of Birth:  1950/05/07           BSA:          1.700 m Patient Age:    72 years            BP:           136/78 mmHg Patient Gender: M                   HR:           62 bpm. Exam Location:  [facility] Procedure: 2D Echo Indications:     Chest Pain R07.9   History:         Patient has prior history of Echocardiogram examinations, most                  recent 12/03/2020.  Sonographer:     Overton Mam RDCS, FASE Referring Phys:  [id number] Andris Baumann Diagnosing Phys: Marcina Millard MD IMPRESSIONS  1. Left ventricular ejection fraction, by estimation, is 60 to 65%. The left ventricle has normal function. The left ventricle has no regional wall motion abnormalities. Left ventricular diastolic parameters are consistent with Grade I diastolic dysfunction (impaired relaxation).  2. Right ventricular systolic function is normal. The right ventricular size is normal.  3. The mitral valve is normal in structure. Trivial mitral valve regurgitation. No evidence of mitral stenosis.  4. The aortic valve is normal in structure. Aortic valve regurgitation is not visualized. No aortic stenosis is present.  5. The inferior vena cava is normal in size with greater than 50% respiratory variability, suggesting right atrial pressure of 3 mmHg. FINDINGS  Left Ventricle: Left ventricular ejection fraction, by estimation, is 60 to 65%. The left ventricle has normal function. The left ventricle has no regional wall  Progress Note    Casey French  FAO:[id number] DOB: 09/23/1950  DOA: 10/07/2022 PCP: Jerl Mina, MD      Brief Narrative:    Medical records reviewed and are as summarized below:  Casey French is a 72 y.o. male  with medical history significant for CAD s/p stent angioplasty LAD and RCA, DM, HTN, A-fib on Eliquis, history of GI bleed , history of diverticulitis, ESRD on PD who still produces urine, hospitalized in June 2024 with altered mental status and hypertensive emergency related to missing dialysis for 4 days and required urgent dialysis at that time.  He presented to the hospital because of general weakness, vomiting, abdominal pain and chest pain.  His BP was 220/110 when EMS picked him up.  In the ED, his BP was 209/100.  CT abdomen showed severe pancolitis in the sigmoid colon.   He was admitted to the hospital for hypertensive urgency and sigmoid colon pancolitis.      Assessment/Plan:   Principal Problem:   Acute colitis Active Problems:   CAD S/P percutaneous coronary angioplasty   Chest pain   Elevated troponin   ESRD on peritoneal dialysis (HCC)   High anion gap metabolic acidosis   Hypertensive urgency, malignant   PAF (paroxysmal atrial fibrillation) (HCC) on chronic anticoagulation   DM type 2 (diabetes mellitus, type 2) (HCC)    Body mass index is 18.52 kg/m.   Acute sigmoid colon pancolitis: He has nausea and vomiting.  Continue clear liquid diet for now and advance diet as tolerated.  Continue IV ceftriaxone and Flagyl.  Analgesics as needed for pain.     Hypertensive urgency: BP is better.  Continue antihypertensives.  (Amlodipine, losartan, metoprolol and isosorbide mononitrate)   ESRD on PD at home: He has been having overnight peritoneal dialysis.  Follow-up with nephrologist.   Hypokalemia: Continue potassium repletion.   Paroxysmal atrial fibrillation: Continue Eliquis   Mildly elevated and flat troponins and  chest pain in a patient with CAD s/p RCA (2003) and LAD (2017) stents: Chest pain has resolved.  Continue Eliquis.  No further cardiac diagnostics per cardiologist. 2D echo showed EF estimated at 60 to 65%, grade 1 diastolic dysfunction.   Type II DM: NovoLog as needed for hyperglycemia.    Diet Order             Diet clear liquid Room service appropriate? Yes; Fluid consistency: Thin  Diet effective now                            Consultants: Nephrologist  Procedures: None    Medications:    amLODipine  10 mg Oral Daily   apixaban  2.5 mg Oral BID   calcitRIOL  0.25 mcg Oral Daily   Chlorhexidine Gluconate Cloth  6 each Topical Daily   escitalopram  10 mg Oral Daily   gentamicin cream  1 Application Topical Daily   insulin aspart  0-5 Units Subcutaneous QHS   insulin aspart  0-6 Units Subcutaneous TID WC   isosorbide mononitrate  60 mg Oral Daily   losartan  100 mg Oral Daily   metoprolol succinate  100 mg Oral Daily   pantoprazole  40 mg Oral Daily   potassium chloride  40 mEq Oral BID   rosuvastatin  40 mg Oral Daily   Continuous Infusions:  cefTRIAXone (ROCEPHIN)  IV Stopped (10/09/22 2303)   dialysis solution 1.5% low-MG/low-CA  Progress Note    Casey French  FAO:[id number] DOB: 09/23/1950  DOA: 10/07/2022 PCP: Jerl Mina, MD      Brief Narrative:    Medical records reviewed and are as summarized below:  Casey French is a 72 y.o. male  with medical history significant for CAD s/p stent angioplasty LAD and RCA, DM, HTN, A-fib on Eliquis, history of GI bleed , history of diverticulitis, ESRD on PD who still produces urine, hospitalized in June 2024 with altered mental status and hypertensive emergency related to missing dialysis for 4 days and required urgent dialysis at that time.  He presented to the hospital because of general weakness, vomiting, abdominal pain and chest pain.  His BP was 220/110 when EMS picked him up.  In the ED, his BP was 209/100.  CT abdomen showed severe pancolitis in the sigmoid colon.   He was admitted to the hospital for hypertensive urgency and sigmoid colon pancolitis.      Assessment/Plan:   Principal Problem:   Acute colitis Active Problems:   CAD S/P percutaneous coronary angioplasty   Chest pain   Elevated troponin   ESRD on peritoneal dialysis (HCC)   High anion gap metabolic acidosis   Hypertensive urgency, malignant   PAF (paroxysmal atrial fibrillation) (HCC) on chronic anticoagulation   DM type 2 (diabetes mellitus, type 2) (HCC)    Body mass index is 18.52 kg/m.   Acute sigmoid colon pancolitis: He has nausea and vomiting.  Continue clear liquid diet for now and advance diet as tolerated.  Continue IV ceftriaxone and Flagyl.  Analgesics as needed for pain.     Hypertensive urgency: BP is better.  Continue antihypertensives.  (Amlodipine, losartan, metoprolol and isosorbide mononitrate)   ESRD on PD at home: He has been having overnight peritoneal dialysis.  Follow-up with nephrologist.   Hypokalemia: Continue potassium repletion.   Paroxysmal atrial fibrillation: Continue Eliquis   Mildly elevated and flat troponins and  chest pain in a patient with CAD s/p RCA (2003) and LAD (2017) stents: Chest pain has resolved.  Continue Eliquis.  No further cardiac diagnostics per cardiologist. 2D echo showed EF estimated at 60 to 65%, grade 1 diastolic dysfunction.   Type II DM: NovoLog as needed for hyperglycemia.    Diet Order             Diet clear liquid Room service appropriate? Yes; Fluid consistency: Thin  Diet effective now                            Consultants: Nephrologist  Procedures: None    Medications:    amLODipine  10 mg Oral Daily   apixaban  2.5 mg Oral BID   calcitRIOL  0.25 mcg Oral Daily   Chlorhexidine Gluconate Cloth  6 each Topical Daily   escitalopram  10 mg Oral Daily   gentamicin cream  1 Application Topical Daily   insulin aspart  0-5 Units Subcutaneous QHS   insulin aspart  0-6 Units Subcutaneous TID WC   isosorbide mononitrate  60 mg Oral Daily   losartan  100 mg Oral Daily   metoprolol succinate  100 mg Oral Daily   pantoprazole  40 mg Oral Daily   potassium chloride  40 mEq Oral BID   rosuvastatin  40 mg Oral Daily   Continuous Infusions:  cefTRIAXone (ROCEPHIN)  IV Stopped (10/09/22 2303)   dialysis solution 1.5% low-MG/low-CA  Progress Note    Casey French  FAO:[id number] DOB: 09/23/1950  DOA: 10/07/2022 PCP: Jerl Mina, MD      Brief Narrative:    Medical records reviewed and are as summarized below:  Casey French is a 72 y.o. male  with medical history significant for CAD s/p stent angioplasty LAD and RCA, DM, HTN, A-fib on Eliquis, history of GI bleed , history of diverticulitis, ESRD on PD who still produces urine, hospitalized in June 2024 with altered mental status and hypertensive emergency related to missing dialysis for 4 days and required urgent dialysis at that time.  He presented to the hospital because of general weakness, vomiting, abdominal pain and chest pain.  His BP was 220/110 when EMS picked him up.  In the ED, his BP was 209/100.  CT abdomen showed severe pancolitis in the sigmoid colon.   He was admitted to the hospital for hypertensive urgency and sigmoid colon pancolitis.      Assessment/Plan:   Principal Problem:   Acute colitis Active Problems:   CAD S/P percutaneous coronary angioplasty   Chest pain   Elevated troponin   ESRD on peritoneal dialysis (HCC)   High anion gap metabolic acidosis   Hypertensive urgency, malignant   PAF (paroxysmal atrial fibrillation) (HCC) on chronic anticoagulation   DM type 2 (diabetes mellitus, type 2) (HCC)    Body mass index is 18.52 kg/m.   Acute sigmoid colon pancolitis: He has nausea and vomiting.  Continue clear liquid diet for now and advance diet as tolerated.  Continue IV ceftriaxone and Flagyl.  Analgesics as needed for pain.     Hypertensive urgency: BP is better.  Continue antihypertensives.  (Amlodipine, losartan, metoprolol and isosorbide mononitrate)   ESRD on PD at home: He has been having overnight peritoneal dialysis.  Follow-up with nephrologist.   Hypokalemia: Continue potassium repletion.   Paroxysmal atrial fibrillation: Continue Eliquis   Mildly elevated and flat troponins and  chest pain in a patient with CAD s/p RCA (2003) and LAD (2017) stents: Chest pain has resolved.  Continue Eliquis.  No further cardiac diagnostics per cardiologist. 2D echo showed EF estimated at 60 to 65%, grade 1 diastolic dysfunction.   Type II DM: NovoLog as needed for hyperglycemia.    Diet Order             Diet clear liquid Room service appropriate? Yes; Fluid consistency: Thin  Diet effective now                            Consultants: Nephrologist  Procedures: None    Medications:    amLODipine  10 mg Oral Daily   apixaban  2.5 mg Oral BID   calcitRIOL  0.25 mcg Oral Daily   Chlorhexidine Gluconate Cloth  6 each Topical Daily   escitalopram  10 mg Oral Daily   gentamicin cream  1 Application Topical Daily   insulin aspart  0-5 Units Subcutaneous QHS   insulin aspart  0-6 Units Subcutaneous TID WC   isosorbide mononitrate  60 mg Oral Daily   losartan  100 mg Oral Daily   metoprolol succinate  100 mg Oral Daily   pantoprazole  40 mg Oral Daily   potassium chloride  40 mEq Oral BID   rosuvastatin  40 mg Oral Daily   Continuous Infusions:  cefTRIAXone (ROCEPHIN)  IV Stopped (10/09/22 2303)   dialysis solution 1.5% low-MG/low-CA  in the last 168 hours.  CBC: Recent Labs  Lab 10/07/22 1507 10/08/22 0501  WBC 9.4 9.7  NEUTROABS 8.7*  --   HGB 14.3 13.3  HCT 44.2 40.1  MCV 91.9 90.7  PLT 168 172   Cardiac Enzymes: No results for input(s): "CKTOTAL", "CKMB", "CKMBINDEX", "TROPONINI" in the last 168 hours. BNP (last 3 results) No results for input(s): "PROBNP" in the last 8760 hours. CBG: Recent Labs  Lab 10/08/22 2157 10/09/22 0740 10/09/22 1209 10/09/22 1648 10/09/22 2215  GLUCAP 116* 91 92 95 143*   D-Dimer: No results for input(s): "DDIMER" in the last 72 hours. Hgb A1c: No results for input(s): "HGBA1C" in the last 72 hours. Lipid Profile: No results for input(s): "CHOL", "HDL", "LDLCALC", "TRIG", "CHOLHDL", "LDLDIRECT" in the last 72 hours. Thyroid function studies: No results for input(s): "TSH", "T4TOTAL", "T3FREE", "THYROIDAB" in the last 72 hours.  Invalid input(s): "FREET3" Anemia work up: No results for input(s): "VITAMINB12", "FOLATE", "FERRITIN", "TIBC", "IRON", "RETICCTPCT" in the last 72 hours. Sepsis Labs: Recent Labs  Lab 10/07/22 1507 10/08/22 0501  WBC 9.4 9.7  LATICACIDVEN 1.6  --     Microbiology No results found for this or any previous visit (from the past 240 hour(s)).  Procedures and diagnostic studies:  ECHOCARDIOGRAM COMPLETE  Result Date: 10/09/2022    ECHOCARDIOGRAM REPORT   Patient Name:   Casey French Date of Exam: 10/09/2022 Medical Rec #:  [id number]           Height:       69.0 in Accession #:    [id number]          Weight:       126.3 lb Date of Birth:  1950/05/07           BSA:          1.700 m Patient Age:    72 years            BP:           136/78 mmHg Patient Gender: M                   HR:           62 bpm. Exam Location:  [facility] Procedure: 2D Echo Indications:     Chest Pain R07.9   History:         Patient has prior history of Echocardiogram examinations, most                  recent 12/03/2020.  Sonographer:     Overton Mam RDCS, FASE Referring Phys:  [id number] Andris Baumann Diagnosing Phys: Marcina Millard MD IMPRESSIONS  1. Left ventricular ejection fraction, by estimation, is 60 to 65%. The left ventricle has normal function. The left ventricle has no regional wall motion abnormalities. Left ventricular diastolic parameters are consistent with Grade I diastolic dysfunction (impaired relaxation).  2. Right ventricular systolic function is normal. The right ventricular size is normal.  3. The mitral valve is normal in structure. Trivial mitral valve regurgitation. No evidence of mitral stenosis.  4. The aortic valve is normal in structure. Aortic valve regurgitation is not visualized. No aortic stenosis is present.  5. The inferior vena cava is normal in size with greater than 50% respiratory variability, suggesting right atrial pressure of 3 mmHg. FINDINGS  Left Ventricle: Left ventricular ejection fraction, by estimation, is 60 to 65%. The left ventricle has normal function. The left ventricle has no regional wall

## 2022-10-10 NOTE — Progress Notes (Signed)
[facility] Cardiology  SUBJECTIVE: Patient laying in bed, denies chest pain or shortness of breath, but does complain of persistent nausea with ongoing pancolitis   Vitals:   10/09/22 2009 10/09/22 2055 10/10/22 0429 10/10/22 0900  BP:  (!) 143/78 (!) 140/87 (!) 162/89  Pulse:  63 64 73  Resp:  18 20 18   Temp:  98.3 F (36.8 C) 98.3 F (36.8 C) 97.8 F (36.6 C)  TempSrc:  Oral Oral Oral  SpO2:  97% 97% 96%  Weight: 57 kg  56.9 kg   Height:         Intake/Output Summary (Last 24 hours) at 10/10/2022 1021 Last data filed at 10/10/2022 7846 Gross per 24 hour  Intake 930 ml  Output 404 ml  Net 526 ml      PHYSICAL EXAM  General: Well developed, well nourished, in no acute distress HEENT:  Normocephalic and atramatic Neck:  No JVD.  Lungs: Clear bilaterally to auscultation and percussion. Heart: HRRR . Normal S1 and S2 without gallops or murmurs.  Abdomen: Bowel sounds are positive, abdomen soft and non-tender  Msk:  Back normal, normal gait. Normal strength and tone for age. Extremities: No clubbing, cyanosis or edema.   Neuro: Alert and oriented X 3. Psych:  Good affect, responds appropriately   LABS: Basic Metabolic Panel: Recent Labs    10/09/22 0812 10/10/22 0528  NA 136 137  K 3.0* 3.0*  CL 102 102  CO2 22 22  GLUCOSE 87 101*  BUN 43* 39*  CREATININE 6.92* 6.41*  CALCIUM 7.3* 7.6*  MG 1.5* 2.0   Liver Function Tests: Recent Labs    10/07/22 1507  AST 34  ALT 25  ALKPHOS 90  BILITOT 0.9  PROT 6.1*  ALBUMIN 3.1*   No results for input(s): "LIPASE", "AMYLASE" in the last 72 hours. CBC: Recent Labs    10/07/22 1507 10/08/22 0501  WBC 9.4 9.7  NEUTROABS 8.7*  --   HGB 14.3 13.3  HCT 44.2 40.1  MCV 91.9 90.7  PLT 168 172   Cardiac Enzymes: No results for input(s): "CKTOTAL", "CKMB", "CKMBINDEX", "TROPONINI" in the last 72 hours. BNP: Invalid input(s): "POCBNP" D-Dimer: No results for input(s): "DDIMER" in the last 72 hours. Hemoglobin  A1C: No results for input(s): "HGBA1C" in the last 72 hours. Fasting Lipid Panel: No results for input(s): "CHOL", "HDL", "LDLCALC", "TRIG", "CHOLHDL", "LDLDIRECT" in the last 72 hours. Thyroid Function Tests: No results for input(s): "TSH", "T4TOTAL", "T3FREE", "THYROIDAB" in the last 72 hours.  Invalid input(s): "FREET3" Anemia Panel: No results for input(s): "VITAMINB12", "FOLATE", "FERRITIN", "TIBC", "IRON", "RETICCTPCT" in the last 72 hours.  ECHOCARDIOGRAM COMPLETE  Result Date: 10/09/2022    ECHOCARDIOGRAM REPORT   Patient Name:   Casey French Date of Exam: 10/09/2022 Medical Rec #:  [id number]           Height:       69.0 in Accession #:    [id number]          Weight:       126.3 lb Date of Birth:  Nov 24, 1950           BSA:          1.700 m Patient Age:    72 years            BP:           136/78 mmHg Patient Gender: M  HR:           62 bpm. Exam Location:  [facility] Procedure: 2D Echo Indications:     Chest Pain R07.9  History:         Patient has prior history of Echocardiogram examinations, most                  recent 12/03/2020.  Sonographer:     Overton Mam RDCS, FASE Referring Phys:  [id number] Andris Baumann Diagnosing Phys: Marcina Millard MD IMPRESSIONS  1. Left ventricular ejection fraction, by estimation, is 60 to 65%. The left ventricle has normal function. The left ventricle has no regional wall motion abnormalities. Left ventricular diastolic parameters are consistent with Grade I diastolic dysfunction (impaired relaxation).  2. Right ventricular systolic function is normal. The right ventricular size is normal.  3. The mitral valve is normal in structure. Trivial mitral valve regurgitation. No evidence of mitral stenosis.  4. The aortic valve is normal in structure. Aortic valve regurgitation is not visualized. No aortic stenosis is present.  5. The inferior vena cava is normal in size with greater than 50% respiratory variability, suggesting right atrial  pressure of 3 mmHg. FINDINGS  Left Ventricle: Left ventricular ejection fraction, by estimation, is 60 to 65%. The left ventricle has normal function. The left ventricle has no regional wall motion abnormalities. The left ventricular internal cavity size was normal in size. There is  no left ventricular hypertrophy. Left ventricular diastolic parameters are consistent with Grade I diastolic dysfunction (impaired relaxation). Right Ventricle: The right ventricular size is normal. No increase in right ventricular wall thickness. Right ventricular systolic function is normal. Left Atrium: Left atrial size was normal in size. Right Atrium: Right atrial size was normal in size. Pericardium: There is no evidence of pericardial effusion. Mitral Valve: The mitral valve is normal in structure. Trivial mitral valve regurgitation. No evidence of mitral valve stenosis. Tricuspid Valve: The tricuspid valve is normal in structure. Tricuspid valve regurgitation is trivial. No evidence of tricuspid stenosis. Aortic Valve: The aortic valve is normal in structure. Aortic valve regurgitation is not visualized. No aortic stenosis is present. Aortic valve peak gradient measures 13.0 mmHg. Pulmonic Valve: The pulmonic valve was normal in structure. Pulmonic valve regurgitation is not visualized. No evidence of pulmonic stenosis. Aorta: The aortic root is normal in size and structure. Venous: The inferior vena cava is normal in size with greater than 50% respiratory variability, suggesting right atrial pressure of 3 mmHg. IAS/Shunts: No atrial level shunt detected by color flow Doppler.  LEFT VENTRICLE PLAX 2D LVIDd:         3.80 cm   Diastology LVIDs:         2.20 cm   LV e' medial:    8.05 cm/s LV PW:         1.10 cm   LV E/e' medial:  11.8 LV IVS:        1.20 cm   LV e' lateral:   14.70 cm/s LVOT diam:     1.80 cm   LV E/e' lateral: 6.5 LV SV:         71 LV SV Index:   42 LVOT Area:     2.54 cm  RIGHT VENTRICLE RV Basal diam:  2.20 cm  RV S prime:     24.80 cm/s TAPSE (M-mode): 2.4 cm LEFT ATRIUM             Index  RIGHT ATRIUM           Index LA diam:        3.20 cm 1.88 cm/m   RA Area:     14.60 cm LA Vol (A2C):   29.8 ml 17.53 ml/m  RA Volume:   36.60 ml  21.54 ml/m LA Vol (A4C):   50.9 ml 29.95 ml/m LA Biplane Vol: 41.4 ml 24.36 ml/m  AORTIC VALVE                 PULMONIC VALVE AV Area (Vmax): 1.97 cm     PV Vmax:       1.14 m/s AV Vmax:        180.00 cm/s  PV Peak grad:  5.2 mmHg AV Peak Grad:   13.0 mmHg LVOT Vmax:      139.00 cm/s LVOT Vmean:     91.900 cm/s LVOT VTI:       0.278 m  AORTA Ao Root diam: 4.00 cm Ao Asc diam:  3.30 cm MITRAL VALVE                TRICUSPID VALVE MV Area (PHT): 2.55 cm     TR Peak grad:   26.0 mmHg MV Decel Time: 297 msec     TR Vmax:        255.00 cm/s MV E velocity: 94.90 cm/s MV A velocity: 108.00 cm/s  SHUNTS MV E/A ratio:  0.88         Systemic VTI:  0.28 m                             Systemic Diam: 1.80 cm Marcina Millard MD Electronically signed by Marcina Millard MD Signature Date/Time: 10/09/2022/11:12:11 AM    Final      Echo LVEF 60-65%  TELEMETRY: Sinus rhythm:  ASSESSMENT AND PLAN:  Principal Problem:   Acute colitis Active Problems:   DM type 2 (diabetes mellitus, type 2) (HCC)   CAD S/P percutaneous coronary angioplasty   Chest pain   ESRD on peritoneal dialysis (HCC)   PAF (paroxysmal atrial fibrillation) (HCC) on chronic anticoagulation   Hypertensive urgency, malignant   Elevated troponin   High anion gap metabolic acidosis    1. Coronary artery disease, history of inferior STEMI in [place] DES RCA 10/29 2003, DES mid LAD 07/21/2015, currently without chest pain or ischemic ECG changes 2.  Borderline elevated troponin, likely demand supply ischemia, secondary to pancolitis and hypertensive urgency 3.  Pancolitis, with persistent nausea 4.  Hypertensive urgency, blood pressure improved, but still elevated, likely due to abdominal discomfort and  persistent nausea, on metoprolol succinate, losartan, and amlodipine 5.  Paroxysmal atrial fibrillation, on low-dose Eliquis, with chronic anemia, followed by hematology, undergoes chronic iron infusions 6.  Hyperlipidemia, on rosuvastatin   Recommendations   1.  Agree with current plan 2.  Continue low-dose Eliquis for stroke prevention 3.  Continue metoprolol succinate, amlodipine, and losartan for blood pressure control 4.  Continue isosorbide mononitrate, DC Nitropaste 5.  No further cardiac diagnostics at this time   Marcina Millard, MD, PhD, [facility] 10/10/2022 10:21 AM

## 2022-10-10 NOTE — Progress Notes (Signed)
Peritoneal Dialysis post treatment note  PD treatment completed.   Patient tolerated treatment well.   PD effluent is clear/yellow. No specimen collected. PD exit site clean, dry and intact.   Patient is awake, oriented and in no acute distress.C/O of abdominal pain 5/10 Report given to bedside nurse.  Post treatment VS:see table below  Total UF removed:  104  Post treatment weight: 56.9kg bed scale    10/10/22 0429  Vitals  Temp 98.3 F (36.8 C)  Temp Source Oral  BP (!) 140/87  MAP (mmHg) 102  BP Location Right Arm  BP Method Automatic  Patient Position (if appropriate) Lying  Pulse Rate 64  Pulse Rate Source Dinamap  Resp 20  Level of Consciousness  Level of Consciousness Alert  MEWS COLOR  MEWS Score Color Green  Oxygen Therapy  SpO2 97 %  O2 Device Room Air  Pain Assessment  Pain Scale 0-10  Pain Score 5  Pain Type Acute pain  Pain Location Abdomen  Height and Weight  Weight 56.9 kg (bed scale)  Type of Weight Post-Dialysis  BMI (Calculated) 18.52  MEWS Score  MEWS Temp 0  MEWS Systolic 0  MEWS Pulse 0  MEWS RR 0  MEWS LOC 0  MEWS Score 0     Hulen Shouts RN [facility]

## 2022-10-10 NOTE — Progress Notes (Signed)
[facility]  ROUNDING NOTE   Subjective:   Peritoneal dialysis overnight. Tolerated treatment well.   Patient with nausea and vomiting.   Objective:  Vital signs in last 24 hours:  Temp:  [97.8 F (36.6 C)-98.3 F (36.8 C)] 97.8 F (36.6 C) (09/15 0900) Pulse Rate:  [60-73] 73 (09/15 0900) Resp:  [16-20] 18 (09/15 0900) BP: (127-162)/(77-89) 162/89 (09/15 0900) SpO2:  [95 %-97 %] 96 % (09/15 0900) Weight:  [56.9 kg-57 kg] 56.9 kg (09/15 0429)  Weight change: 2.3 kg Filed Weights   10/09/22 0110 10/09/22 2009 10/10/22 0429  Weight: 57.3 kg 57 kg 56.9 kg    Intake/Output: I/O last 3 completed shifts: In: 1310 [P.O.:810; IV Piggyback:500] Out: 907 [Urine:725; Other:182]   Intake/Output this shift:  Total I/O In: 100 [IV Piggyback:100] Out: -   Physical Exam: General: No acute distress  Head: Normocephalic, atraumatic. Moist oral mucosal membranes  Neck: Supple  Lungs:  Clear to auscultation, normal effort  Heart: regular  Abdomen:  Soft, mild diffuse tenderness.  Extremities: No peripheral edema.  Neurologic: Awake, alert, following commands  Skin: No acute rash  Access: PD catheter in place    Basic Metabolic Panel: Recent Labs  Lab 10/07/22 1507 10/07/22 1510 10/08/22 0501 10/09/22 0812 10/10/22 0528  NA 139  --  142 136 137  K 3.9  --  3.7 3.0* 3.0*  CL 108  --  111 102 102  CO2 15*  --  21* 22 22  GLUCOSE 143*  --  92 87 101*  BUN 42*  --  48* 43* 39*  CREATININE 6.83*  --  7.09* 6.92* 6.41*  CALCIUM 8.6*  --  8.3* 7.3* 7.6*  MG  --  1.8  --  1.5* 2.0    Liver Function Tests: Recent Labs  Lab 10/07/22 1507  AST 34  ALT 25  ALKPHOS 90  BILITOT 0.9  PROT 6.1*  ALBUMIN 3.1*   No results for input(s): "LIPASE", "AMYLASE" in the last 168 hours. No results for input(s): "AMMONIA" in the last 168 hours.  CBC: Recent Labs  Lab 10/07/22 1507 10/08/22 0501  WBC 9.4 9.7  NEUTROABS 8.7*  --   HGB 14.3 13.3  HCT 44.2 40.1   MCV 91.9 90.7  PLT 168 172    Cardiac Enzymes: No results for input(s): "CKTOTAL", "CKMB", "CKMBINDEX", "TROPONINI" in the last 168 hours.  BNP: Invalid input(s): "POCBNP"  CBG: Recent Labs  Lab 10/08/22 2157 10/09/22 0740 10/09/22 1209 10/09/22 1648 10/09/22 2215  GLUCAP 116* 91 92 95 143*    Microbiology: Results for orders placed or performed during the hospital encounter of 06/26/22  SARS Coronavirus 2 by RT PCR (hospital order, performed in [facility] hospital lab) *cepheid single result test* Anterior Nasal Swab     Status: None   Collection Time: 06/26/22  5:13 PM   Specimen: Anterior Nasal Swab  Result Value Ref Range Status   SARS Coronavirus 2 by RT PCR NEGATIVE NEGATIVE Final    Comment: (NOTE) SARS-CoV-2 target nucleic acids are NOT DETECTED.  The SARS-CoV-2 RNA is generally detectable in upper and lower respiratory specimens during the acute phase of infection. The lowest concentration of SARS-CoV-2 viral copies this assay can detect is 250 copies / mL. A negative result does not preclude SARS-CoV-2 infection and should not be used as the sole basis for treatment or other patient management decisions.  A negative result may occur with improper specimen collection / handling, submission of specimen  other than nasopharyngeal swab, presence of viral mutation(s) within the areas targeted by this assay, and inadequate number of viral copies (<250 copies / mL). A negative result must be combined with clinical observations, patient history, and epidemiological information.  Fact Sheet for Patients:   RoadLapTop.co.za  Fact Sheet for Healthcare Providers: http://kim-miller.com/  This test is not yet approved or  cleared by the [place] FDA and has been authorized for detection and/or diagnosis of SARS-CoV-2 by FDA under an Emergency Use Authorization (EUA).  This EUA will remain in effect (meaning this test  can be used) for the duration of the COVID-19 declaration under Section 564(b)(1) of the Act, 21 U.S.C. section 360bbb-3(b)(1), unless the authorization is terminated or revoked sooner.  Performed at [facility], [address] [place], [place] [address]   MRSA Next Gen by PCR, Nasal     Status: None   Collection Time: 06/26/22  5:31 PM   Specimen: Nasal Mucosa; Nasal Swab  Result Value Ref Range Status   MRSA by PCR Next Gen NOT DETECTED NOT DETECTED Final    Comment: (NOTE) The GeneXpert MRSA Assay (FDA approved for NASAL specimens only), is one component of a comprehensive MRSA colonization surveillance program. It is not intended to diagnose MRSA infection nor to guide or monitor treatment for MRSA infections. Test performance is not FDA approved in patients less than 82 years old. Performed at [facility], [address] [place], [place] [address]     Coagulation Studies: No results for input(s): "LABPROT", "INR" in the last 72 hours.  Urinalysis: Recent Labs    10/07/22 2013  COLORURINE STRAW*  LABSPEC 1.012  PHURINE 8.0  GLUCOSEU >=500*  HGBUR SMALL*  BILIRUBINUR NEGATIVE  KETONESUR 5*  PROTEINUR >=300*  NITRITE NEGATIVE  LEUKOCYTESUR NEGATIVE      Imaging: ECHOCARDIOGRAM COMPLETE  Result Date: 10/09/2022    ECHOCARDIOGRAM REPORT   Patient Name:   Casey French Date of Exam: 10/09/2022 Medical Rec #:  [id number]           Height:       69.0 in Accession #:    [id number]          Weight:       126.3 lb Date of Birth:  11-11-1950           BSA:          1.700 m Patient Age:    72 years            BP:           136/78 mmHg Patient Gender: M                   HR:           62 bpm. Exam Location:  [facility] Procedure: 2D Echo Indications:     Chest Pain R07.9  History:         Patient has prior history of Echocardiogram examinations, most                  recent 12/03/2020.  Sonographer:     Overton Mam RDCS, FASE Referring Phys:  [id number]  Andris Baumann Diagnosing Phys: Marcina Millard MD IMPRESSIONS  1. Left ventricular ejection fraction, by estimation, is 60 to 65%. The left ventricle has normal function. The left ventricle has no regional wall motion abnormalities. Left ventricular diastolic parameters are consistent with Grade I diastolic dysfunction (impaired relaxation).  2. Right ventricular systolic  function is normal. The right ventricular size is normal.  3. The mitral valve is normal in structure. Trivial mitral valve regurgitation. No evidence of mitral stenosis.  4. The aortic valve is normal in structure. Aortic valve regurgitation is not visualized. No aortic stenosis is present.  5. The inferior vena cava is normal in size with greater than 50% respiratory variability, suggesting right atrial pressure of 3 mmHg. FINDINGS  Left Ventricle: Left ventricular ejection fraction, by estimation, is 60 to 65%. The left ventricle has normal function. The left ventricle has no regional wall motion abnormalities. The left ventricular internal cavity size was normal in size. There is  no left ventricular hypertrophy. Left ventricular diastolic parameters are consistent with Grade I diastolic dysfunction (impaired relaxation). Right Ventricle: The right ventricular size is normal. No increase in right ventricular wall thickness. Right ventricular systolic function is normal. Left Atrium: Left atrial size was normal in size. Right Atrium: Right atrial size was normal in size. Pericardium: There is no evidence of pericardial effusion. Mitral Valve: The mitral valve is normal in structure. Trivial mitral valve regurgitation. No evidence of mitral valve stenosis. Tricuspid Valve: The tricuspid valve is normal in structure. Tricuspid valve regurgitation is trivial. No evidence of tricuspid stenosis. Aortic Valve: The aortic valve is normal in structure. Aortic valve regurgitation is not visualized. No aortic stenosis is present. Aortic valve peak  gradient measures 13.0 mmHg. Pulmonic Valve: The pulmonic valve was normal in structure. Pulmonic valve regurgitation is not visualized. No evidence of pulmonic stenosis. Aorta: The aortic root is normal in size and structure. Venous: The inferior vena cava is normal in size with greater than 50% respiratory variability, suggesting right atrial pressure of 3 mmHg. IAS/Shunts: No atrial level shunt detected by color flow Doppler.  LEFT VENTRICLE PLAX 2D LVIDd:         3.80 cm   Diastology LVIDs:         2.20 cm   LV e' medial:    8.05 cm/s LV PW:         1.10 cm   LV E/e' medial:  11.8 LV IVS:        1.20 cm   LV e' lateral:   14.70 cm/s LVOT diam:     1.80 cm   LV E/e' lateral: 6.5 LV SV:         71 LV SV Index:   42 LVOT Area:     2.54 cm  RIGHT VENTRICLE RV Basal diam:  2.20 cm RV S prime:     24.80 cm/s TAPSE (M-mode): 2.4 cm LEFT ATRIUM             Index        RIGHT ATRIUM           Index LA diam:        3.20 cm 1.88 cm/m   RA Area:     14.60 cm LA Vol (A2C):   29.8 ml 17.53 ml/m  RA Volume:   36.60 ml  21.54 ml/m LA Vol (A4C):   50.9 ml 29.95 ml/m LA Biplane Vol: 41.4 ml 24.36 ml/m  AORTIC VALVE                 PULMONIC VALVE AV Area (Vmax): 1.97 cm     PV Vmax:       1.14 m/s AV Vmax:        180.00 cm/s  PV Peak grad:  5.2 mmHg AV Peak Grad:  13.0 mmHg LVOT Vmax:      139.00 cm/s LVOT Vmean:     91.900 cm/s LVOT VTI:       0.278 m  AORTA Ao Root diam: 4.00 cm Ao Asc diam:  3.30 cm MITRAL VALVE                TRICUSPID VALVE MV Area (PHT): 2.55 cm     TR Peak grad:   26.0 mmHg MV Decel Time: 297 msec     TR Vmax:        255.00 cm/s MV E velocity: 94.90 cm/s MV A velocity: 108.00 cm/s  SHUNTS MV E/A ratio:  0.88         Systemic VTI:  0.28 m                             Systemic Diam: 1.80 cm Marcina Millard MD Electronically signed by Marcina Millard MD Signature Date/Time: 10/09/2022/11:12:11 AM    Final      Medications:    cefTRIAXone (ROCEPHIN)  IV Stopped (10/09/22 2303)   dialysis  solution 1.5% low-MG/low-CA     metronidazole 500 mg (10/10/22 0900)   promethazine (PHENERGAN) injection (IM or IVPB)      amLODipine  10 mg Oral Daily   apixaban  2.5 mg Oral BID   calcitRIOL  0.25 mcg Oral Daily   Chlorhexidine Gluconate Cloth  6 each Topical Daily   escitalopram  10 mg Oral Daily   gentamicin cream  1 Application Topical Daily   insulin aspart  0-5 Units Subcutaneous QHS   insulin aspart  0-6 Units Subcutaneous TID WC   isosorbide mononitrate  60 mg Oral Daily   losartan  100 mg Oral Daily   metoprolol succinate  100 mg Oral Daily   pantoprazole  40 mg Oral Daily   rosuvastatin  40 mg Oral Daily   acetaminophen **OR** acetaminophen, HYDROmorphone (DILAUDID) injection, nitroGLYCERIN, ondansetron **OR** ondansetron (ZOFRAN) IV, oxyCODONE, promethazine (PHENERGAN) injection (IM or IVPB)  Assessment/ Plan:   Casey French is a 72 y.o. 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  ESRD on peritoneal dialysis.  Patient reports that he did not perform treatment for 4 days prior to admission.  - Peritoneal dialysis for tonight.    2.  Pancolitis.   - empiric ceftriaxone and metronidazole.   - supportive treatment.   3.  Hypertension: urgency on admission.  - Continue amlodipine, losartan, isosorbide mononitrate and metoprolol  4.  Secondary hyperparathyroidism. - Continue Calcitrol 0.25 mcg p.o. daily.  5. Diabetes mellitus type II with chronic kidney disease - Continue glucose control.    LOS: 3 Ranesha Val 9/15/202411:00 AM

## 2022-10-11 DIAGNOSIS — K529 Noninfective gastroenteritis and colitis, unspecified: Secondary | ICD-10-CM | POA: Diagnosis not present

## 2022-10-11 LAB — BASIC METABOLIC PANEL
Anion gap: 10 (ref 5–15)
BUN: 33 mg/dL — ABNORMAL HIGH (ref 8–23)
CO2: 22 mmol/L (ref 22–32)
Calcium: 7.7 mg/dL — ABNORMAL LOW (ref 8.9–10.3)
Chloride: 106 mmol/L (ref 98–111)
Creatinine, Ser: 6.16 mg/dL — ABNORMAL HIGH (ref 0.61–1.24)
GFR, Estimated: 9 mL/min — ABNORMAL LOW (ref >60)
Glucose, Bld: 82 mg/dL (ref 70–99)
Potassium: 3.4 mmol/L — ABNORMAL LOW (ref 3.5–5.1)
Sodium: 138 mmol/L (ref 135–145)

## 2022-10-11 LAB — GLUCOSE, CAPILLARY
Glucose-Capillary: 87 mg/dL (ref 70–99)
Glucose-Capillary: 124 mg/dL — ABNORMAL HIGH (ref 70–99)

## 2022-10-11 MED ORDER — FOLIC ACID 1 MG PO TABS: 1.0000 mg | ORAL_TABLET | Freq: Every day | ORAL | Status: DC

## 2022-10-11 MED ORDER — ACETAMINOPHEN 325 MG PO TABS: 650.0000 mg | ORAL_TABLET | Freq: Four times a day (QID) | ORAL | Status: AC | PRN

## 2022-10-11 MED ORDER — CIPROFLOXACIN HCL 250 MG PO TABS: 250.0000 mg | ORAL_TABLET | ORAL | 0 refills | Status: AC

## 2022-10-11 MED ORDER — CIPROFLOXACIN HCL 500 MG PO TABS: 250.0000 mg | ORAL_TABLET | ORAL | Status: DC

## 2022-10-11 MED ORDER — METRONIDAZOLE 500 MG PO TABS: 500.0000 mg | ORAL_TABLET | Freq: Two times a day (BID) | ORAL | 0 refills | Status: AC

## 2022-10-11 MED ORDER — METRONIDAZOLE 500 MG PO TABS
500.0000 mg | ORAL_TABLET | Freq: Two times a day (BID) | ORAL | Status: DC
  Administered 2022-10-11: 500 mg via ORAL
  Filled 2022-10-11: qty 1

## 2022-10-11 NOTE — Plan of Care (Signed)
  Problem: Education: Goal: Knowledge of General Education information will improve Description: Including pain rating scale, medication(s)/side effects and non-pharmacologic comfort measures Outcome: Adequate for Discharge   Problem: Health Behavior/Discharge Planning: Goal: Ability to manage health-related needs will improve Outcome: Adequate for Discharge   Problem: Clinical Measurements: Goal: Ability to maintain clinical measurements within normal limits will improve Outcome: Adequate for Discharge Goal: Will remain free from infection Outcome: Adequate for Discharge Goal: Diagnostic test results will improve Outcome: Adequate for Discharge Goal: Respiratory complications will improve Outcome: Adequate for Discharge Goal: Cardiovascular complication will be avoided Outcome: Adequate for Discharge   Problem: Activity: Goal: Risk for activity intolerance will decrease Outcome: Adequate for Discharge   Problem: Nutrition: Goal: Adequate nutrition will be maintained Outcome: Adequate for Discharge   Problem: Coping: Goal: Level of anxiety will decrease Outcome: Adequate for Discharge   Problem: Elimination: Goal: Will not experience complications related to bowel motility Outcome: Adequate for Discharge Goal: Will not experience complications related to urinary retention Outcome: Adequate for Discharge   Problem: Pain Managment: Goal: General experience of comfort will improve Outcome: Adequate for Discharge   Problem: Safety: Goal: Ability to remain free from injury will improve Outcome: Adequate for Discharge   Problem: Skin Integrity: Goal: Risk for impaired skin integrity will decrease Outcome: Adequate for Discharge    Pt ao x4, respirations even and unlabored on RA. Pt has all belongings. Pt has received all DC paperwork. Pt being taken home by family. Pt taken down to lobby with staff via wheelchair

## 2022-10-11 NOTE — Discharge Instructions (Addendum)
Low Fiber Nutrition Therapy   You may need a low-fiber diet if you have Crohn's disease, diverticulitis, gastroparesis, ulcerative colitis, a new colostomy, or new ileostomy. A low-fiber diet may also be needed following radiation therapy to the pelvis and lower bowel or recent intestinal surgery.  A low-fiber diet reduces the frequency and volume of your stools. This lessens irritation to the gastrointestinal (GI) tract and can help you heal. Use this diet if you have a stricture so your intestine doesn't get blocked. The goal of this diet is to get less than 8 grams of fiber daily. It's also important to eat enough protein foods while you are on a low-fiber diet.  Drink nutrition supplements that have 1 gram of fiber or less in each serving. If your stricture is severe or if your inflammation is severe, drink more liquids to reduce symptoms and to get enough calories and protein.  Tips Eat about 5 to 6 small meals daily or about every 3 to 4 hours. Do not skip meals.  Every time you eat, include a small amount of protein (1 to 2 ounces) plus an additional food. Low fiber starch foods are the best choice to eat with protein.  Limit acidic, spicy and high-fat or fried and greasy foods to reduce GI symptoms.  Do not eat raw fruits and vegetables while on this diet. All fruits and vegetables need to be cooked and without peels or skins.  Drink a lot of fluids, at least 8 cups of fluid each day. Limit drinks with caffeine, sugar, and sugar substitutes.  Plain water is the best choice. Avoid mixing drink packets or flavor drops into water. .  Take a chewable multivitamin with minerals. Gummy vitamins do not have enough minerals and can block an ostomy and non-chewable supplements are not easily digested. Chewable supplements must be used if you have a stricture or ostomy.  If you are lactose intolerant, you may need to eat low-lactose dairy products. If you can't tolerate dairy, ask your RDN about how  you can get enough calcium from other foods.  Do not take a calcium supplement. They can cause a blockage.  It is important to add high-calcium foods gradually to your diet and monitor for symptoms to avoid a blockage.  Do not add more fiber to your diet until your health care provider or registered dietitian nutritionist (RDN) tells you it's OK. Fiber is part of whole grains, fruits and vegetables (foods from plants) and needs to be slowly added back in to your diet when your body is healed.  Choose foods that have been safely handled and prepared to lower your risk of foodborne illness. Talk to your RDN or see the Food Safety Nutrition Therapy handout for more information.   Foods Recommended These foods are low in fat and fiber and will help with your GI symptoms. Food Group Foods Recommended  Grains  Choose grain foods with less than 2 grams of fiber per serving. Refined white flour products--for example, enriched white bread without seeds, crackers or pasta Cream of wheat or rice Grits (fine ground) Tortillas: white flour or corn White rice, well-cooked (do not rinse, or soak before cooking) Cold and hot cereals made from white or refined flour such as puffed rice or corn flakes  Protein Foods  Lean, very tender, well-cooked poultry or fish; red meats: beef, pork or lamb (slow cook until soft; chop meats if you have stricture or ostomy) Eggs, well-cooked Smooth nut butters such as almond,  peanut, or sunflower Tofu  Dairy  If you have lactose intolerance, drinking milk products from cows or goats may make diarrhea worse. Foods marked with an asterisk (*) have lactose. Milk: fat-free, 1% or 2% * (choose best tolerated) Lactose-free milk Buttermilk* Fortified non-dairy milks: almond, cashew, coconut, or rice (be aware that these options are not good sources of protein so you will need to eat an additional protein food) Kefir* (Don't include kefir in the diet until approved by your health  care provider) Yogurt*/lactose-free yogurt (without nuts, fruit, granola or chocolate) Mild cheese* (hard and aged cheeses tend to be lower in lactose such as cheddar, swiss or parmesan) Cottage cheese* or lactose-free cottage cheese Low-fat ice cream* or lactose-free ice cream Sherbet* (usually lower lactose)  Vegetables  Canned and well-cooked vegetables without seeds, skins, or hulls  Carrots or green beans, cooked White, red or yellow potatoes without skins Strained vegetable juice  Fruit Soft, and well-cooked fruits without skins, seeds, or membranes Canned fruit in juice: peaches, pears, or applesauce Fruit juice without pulp diluted by half with water may be tolerated better Fruit drinks fortified with vitamin C may be tolerated better than 100% fruit juice  Oils  When possible, choose healthy oils and fats, such as olive and canola oils, plant oils rather than solid fats.  Other  Broth and strained soups made from allowed foods Desserts (small portions) without whole grains, seeds, nuts, raisins, or coconut Jelly (clear)   Foods Not Recommended These foods are higher in fat and fiber and may make your GI symptoms worse.  Food Group Foods Not Recommended  Grains  Bread, whole wheat or with whole grain flour or seeds or nuts Brown rice, quinoa, kasha, barley Tortillas: whole grain Whole wheat pasta Whole grain and high-fiber cereals, including oatmeal, bran flakes or shredded wheat Popcorn  Protein Foods  Steak, pork chops, or other meats that are fatty or have gristle Fried meat, poultry, or fish Seafood with a tough or rubbery texture, such as shrimp Luncheon meats such as bologna and salami Sausage, bacon, or hot dogs Dried beans, peas, or lentils Hummus Sushi Nuts and chunky nut butters  Dairy  Whole milk Pea milk and soymilk (may cause diarrhea, gas, bloating, and abdominal pain) Cream Half-and-half Sour cream Yogurt with added fruit, nuts, or granola or chocolate   Vegetables  Alfalfa or bean sprouts (high fiber and risk for bacteria) Raw or undercooked vegetables: beets; broccoli; brussels sprouts; cabbage; cauliflower; collard, mustard, or turnip greens; corn; cucumber; green peas or any kind of peas; kale; lima beans; mushrooms; okra; olives; pickles and relish; onions; parsnips; peppers; potato skins; sauerkraut; spinach; tomatoes  Fruit Raw fruit Dried fruit Avocado, berries, coconut Canned fruit in syrup Canned fruit with mandarin oranges, papaya or pineapple Fruit juice with pulp Prune juice Fruit skin  Oils  Pork rinds   Low-Fiber (8 grams) Sample 1-Day Menu  Breakfast  cup cream of wheat (0.5 gram fiber)  1 slice white toast (1 gram fiber)  1 teaspoon margarine, soft tub  2 scrambled eggs   Morning Snack 1 cup lactose-free nutrition supplement  Lunch 2 slices white bread (2 grams fiber)  3 tablespoons tuna  1 tablespoon mayonnaise  1 cup chicken noodle soup (1 gram fiber)   cup apple juice   Afternoon Snack 6 saltine crackers (0.5 gram fiber)  2 ounces low-fat cheddar cheese  Evening Meal 3 ounces tender chicken breast  1 cup white rice (0.5 gram fiber)   cup  cooked canned green beans (2 grams fiber)   cup cranberry juice   Evening Snack 1 cup lactose-free nutrition supplement   Copyright 2020  Academy of [employer] and Dietetics  High Fiber Nutrition Therapy  Fiber and fluid may help you feel less constipated and bloated and can also help ease diarrhea. Increase fiber slowly over the course of a few weeks. This will keep your symptoms from getting worse. Tips Tips for Adding Fiber to Your Eating Plan Slowly increase the amount of fiber you eat to 25 to 35 grams per day. Eat whole grain breads and cereals. Look for choices with 100% whole wheat, rye, oats, or bran as the first or second ingredient. Have brown or wild rice instead of white rice or potatoes. Enjoy a variety of grains. Good choices include barley, oats,  farro, kamut, and quinoa. Bake with whole wheat flour. You can use it to replace some white or all-purpose flour in recipes. Enjoy baked beans more often! Add dried beans and peas to casseroles or soups. Choose fresh fruit and vegetables instead of juices. Eat fruits and vegetables with peels or skins on. Compare food labels of similar foods to find higher fiber choices. On packaged foods, the amount of fiber per serving is listed on the Nutrition Facts label. Check the Nutrition Facts labels and try to choose products with at least 4 g dietary fiber per serving. Drink plenty of fluids. Set a goal of at least 8 cups per day. You may need even more fluid as you eat higher amounts of fiber. Fluid helps your body process fiber without discomfort. Foods Recommended Foods With at Least 4 g Fiber per Serving Food Group Choose  Grains ?- cup high-fiber cereal  Dried beans and peas  cup cooked red beans, kidney beans, large lima beans, navy beans, pinto beans, white beans, lentils, or black-eyed peas  Vegetables 1 artichoke (cooked)  Fruits  cup blackberries or raspberries 4 dried prunes   Foods With 1 to 3 g Fiber per Serving Food Group Choose  Grains 1 bagel (3.5-inch diameter) 1 slice whole wheat, cracked wheat, pumpernickel, or rye bread 2-inch square cornbread 4 whole wheat crackers 1 bran, blueberry, cornmeal, or English muffin  cup cereal with 1-3 g fiber per serving (check dietary fiber on the product's Nutrition Facts label) 2 tablespoons wheat germ or whole wheat flour  Fruits 1 apple (3-inch diameter) or  cup applesauce  cup apricots (canned) 1 banana  cup cherries (canned or fresh)  cup cranberries (fresh) 3 dates 2 medium figs (fresh)  cup fruit cocktail (canned)  grapefruit 1 kiwi fruit 1 orange (2-inch diameter) 1 peach (fresh) or  cup peaches (canned) 1 pear (fresh) or  cup pears (canned) 1 plum (2-inch diameter)  cup raisins  cup strawberries  (fresh) 1 tangerine  Vegetables  cup bean sprouts (raw)  cup beets (diced, canned)  cup broccoli, brussels sprouts, or cabbage  (cooked)  cup carrots  cup cauliflower  cup corn  cup eggplant  cup okra (boiled)  cup potatoes (baked or mashed)  cup spinach, kale, or turnip greens (cooked)  cup squash--winter, summer, or zucchini (cooked)  cup sweet potatoes or yams  cup tomatoes (canned)  Other 2 tablespoons almonds or peanuts 1 cup popcorn (popped)   High Fiber Vegetarian (Lacto-Ovo) Sample 1-Day Menu  Breakfast  cup bran cereal  1 banana  cup blueberries 1 cup 1% milk  Lunch 2 slices whole wheat bread  2 tablespoons hummus 1 ounce cheddar cheese  1 leaf lettuce 2 slices tomato  cup vegetarian baked beans 1 orange 1 cup 1% milk  Evening Meal Stir fry made with:  cup tempeh   cup brown rice 1 cup frozen broccoli 1 tablespoon soy sauce  cup peanuts  1 pear  Evening Snack 6 ounces fruit yogurt  1 cup air popped popcorn  High Fiber Vegan Sample 1-Day Menu  Breakfast  cup bran cereal  1 banana  cup blueberries 1 cup soymilk fortified with calcium, vitamin B12, and vitamin D  Lunch  cup chili with beans with:   cup tempeh crumbles  cup crushed whole wheat crackers 1 apple 1 cup soymilk fortified with calcium, vitamin B12, and vitamin D  Evening Meal 1 veggie burger  1 whole wheat bun  1 leaf lettuce  1 slice tomato Salad made with: 1 cup lettuce  cup chickpeas   cucumbers 1 tablespoon italian dressing 1 cup strawberries   Evening Snack  cup almonds  1 cup carrot sticks  High Fiber Sample 1-Day Menu  Breakfast 1/2 cup orange juice, with pulp  1/2 cup raisin bran 1 cup fat-free milk 1 cup coffee  Morning Snack 1 cup plain yogurt  2 cups water  Lunch 1 1/2 cups chili  1/2 cup kidney beans 1/2 cup soy crumble 2 tablespoons shredded cheese 8 whole wheat crackers 1 apple (with skin)   Evening Meal 2 ounces sliced chicken  1/4 cup  tofu 2 cups mixed fresh vegetables 1 cup brown rice 1/2 cup strawberries 1 cup hot tea  Evening Snack 2 tablespoons almonds  1 cup hot chocolate   Copyright 2020  Academy of Nutrition and Dietetics

## 2022-10-11 NOTE — Care Management Important Message (Signed)
Important Message  Patient Details  Name: Casey French MRN: [id number] Date of Birth: 03-11-1950   Medicare Important Message Given:  Yes     Johnell Comings 10/11/2022, 10:11 AM

## 2022-10-11 NOTE — Progress Notes (Addendum)
[facility]  ROUNDING NOTE   Subjective:   Patient seen laying in bed Alert and oriented States he is hungry, awaiting breakfast.   Continues to complain of RLQ abd pain  UF -88ml with PD  Objective:  Vital signs in last 24 hours:  Temp:  [97.5 F (36.4 C)-98.4 F (36.9 C)] 97.5 F (36.4 C) (09/16 0757) Pulse Rate:  [61-66] 64 (09/16 0757) Resp:  [16-20] 18 (09/16 0757) BP: (146-152)/(81-86) 147/81 (09/16 0757) SpO2:  [96 %] 96 % (09/16 0757) Weight:  [58.7 kg] 58.7 kg (09/16 0500)  Weight change: 1.7 kg Filed Weights   10/09/22 2009 10/10/22 0429 10/11/22 0500  Weight: 57 kg 56.9 kg 58.7 kg    Intake/Output: I/O last 3 completed shifts: In: 590 [P.O.:240; IV Piggyback:350] Out: 1104 [Urine:1000; Other:104]   Intake/Output this shift:  Total I/O In: 328 [P.O.:240; Other:88] Out: -   Physical Exam: General: No acute distress  Head: Normocephalic, atraumatic. Moist oral mucosal membranes  Neck: Supple  Lungs:  Mild wheeze, normal effort  Heart: regular  Abdomen:  Soft, mild RLQ tenderness.  Extremities: No peripheral edema.  Neurologic: Awake, alert, following commands  Skin: No acute rash  Access: PD catheter in place    Basic Metabolic Panel: Recent Labs  Lab 10/07/22 1507 10/07/22 1510 10/08/22 0501 10/09/22 0812 10/10/22 0528 10/11/22 0509  NA 139  --  142 136 137 138  K 3.9  --  3.7 3.0* 3.0* 3.4*  CL 108  --  111 102 102 106  CO2 15*  --  21* 22 22 22   GLUCOSE 143*  --  92 87 101* 82  BUN 42*  --  48* 43* 39* 33*  CREATININE 6.83*  --  7.09* 6.92* 6.41* 6.16*  CALCIUM 8.6*  --  8.3* 7.3* 7.6* 7.7*  MG  --  1.8  --  1.5* 2.0  --     Liver Function Tests: Recent Labs  Lab 10/07/22 1507  AST 34  ALT 25  ALKPHOS 90  BILITOT 0.9  PROT 6.1*  ALBUMIN 3.1*   No results for input(s): "LIPASE", "AMYLASE" in the last 168 hours. No results for input(s): "AMMONIA" in the last 168 hours.  CBC: Recent Labs  Lab  10/07/22 1507 10/08/22 0501  WBC 9.4 9.7  NEUTROABS 8.7*  --   HGB 14.3 13.3  HCT 44.2 40.1  MCV 91.9 90.7  PLT 168 172    Cardiac Enzymes: No results for input(s): "CKTOTAL", "CKMB", "CKMBINDEX", "TROPONINI" in the last 168 hours.  BNP: Invalid input(s): "POCBNP"  CBG: Recent Labs  Lab 10/09/22 2215 10/10/22 1234 10/10/22 1655 10/10/22 2133 10/11/22 0759  GLUCAP 143* 105* 97 152* 87    Microbiology: Results for orders placed or performed during the hospital encounter of 06/26/22  SARS Coronavirus 2 by RT PCR (hospital order, performed in [facility] hospital lab) *cepheid single result test* Anterior Nasal Swab     Status: None   Collection Time: 06/26/22  5:13 PM   Specimen: Anterior Nasal Swab  Result Value Ref Range Status   SARS Coronavirus 2 by RT PCR NEGATIVE NEGATIVE Final    Comment: (NOTE) SARS-CoV-2 target nucleic acids are NOT DETECTED.  The SARS-CoV-2 RNA is generally detectable in upper and lower respiratory specimens during the acute phase of infection. The lowest concentration of SARS-CoV-2 viral copies this assay can detect is 250 copies / mL. A negative result does not preclude SARS-CoV-2 infection and should not be used as the  sole basis for treatment or other patient management decisions.  A negative result may occur with improper specimen collection / handling, submission of specimen other than nasopharyngeal swab, presence of viral mutation(s) within the areas targeted by this assay, and inadequate number of viral copies (<250 copies / mL). A negative result must be combined with clinical observations, patient history, and epidemiological information.  Fact Sheet for Patients:   RoadLapTop.co.za  Fact Sheet for Healthcare Providers: http://kim-miller.com/  This test is not yet approved or  cleared by the [place] FDA and has been authorized for detection and/or diagnosis of SARS-CoV-2  by FDA under an Emergency Use Authorization (EUA).  This EUA will remain in effect (meaning this test can be used) for the duration of the COVID-19 declaration under Section 564(b)(1) of the Act, 21 U.S.C. section 360bbb-3(b)(1), unless the authorization is terminated or revoked sooner.  Performed at [facility], [address] [place], [place] [address]   MRSA Next Gen by PCR, Nasal     Status: None   Collection Time: 06/26/22  5:31 PM   Specimen: Nasal Mucosa; Nasal Swab  Result Value Ref Range Status   MRSA by PCR Next Gen NOT DETECTED NOT DETECTED Final    Comment: (NOTE) The GeneXpert MRSA Assay (FDA approved for NASAL specimens only), is one component of a comprehensive MRSA colonization surveillance program. It is not intended to diagnose MRSA infection nor to guide or monitor treatment for MRSA infections. Test performance is not FDA approved in patients less than 21 years old. Performed at [facility], [address] [place], [place] [address]     Coagulation Studies: No results for input(s): "LABPROT", "INR" in the last 72 hours.  Urinalysis: No results for input(s): "COLORURINE", "LABSPEC", "PHURINE", "GLUCOSEU", "HGBUR", "BILIRUBINUR", "KETONESUR", "PROTEINUR", "UROBILINOGEN", "NITRITE", "LEUKOCYTESUR" in the last 72 hours.  Invalid input(s): "APPERANCEUR"     Imaging: No results found.   Medications:    dialysis solution 1.5% low-MG/low-CA     promethazine (PHENERGAN) injection (IM or IVPB) 12.5 mg (10/10/22 1237)    amLODipine  10 mg Oral Daily   apixaban  2.5 mg Oral BID   calcitRIOL  0.25 mcg Oral Daily   Chlorhexidine Gluconate Cloth  6 each Topical Daily   ciprofloxacin  250 mg Oral Q24H   escitalopram  10 mg Oral Daily   gentamicin cream  1 Application Topical Daily   insulin aspart  0-5 Units Subcutaneous QHS   insulin aspart  0-6 Units Subcutaneous TID WC   isosorbide mononitrate  60 mg Oral Daily   losartan  100 mg  Oral Daily   metoprolol succinate  100 mg Oral Daily   metroNIDAZOLE  500 mg Oral Q12H   pantoprazole  40 mg Oral Daily   rosuvastatin  40 mg Oral Daily   acetaminophen **OR** acetaminophen, HYDROmorphone (DILAUDID) injection, nitroGLYCERIN, ondansetron **OR** ondansetron (ZOFRAN) IV, oxyCODONE, promethazine (PHENERGAN) injection (IM or IVPB)  Assessment/ Plan:   [name] is a 72 y.o. male with end stage renal disease on hemodailysis, coronary artery diease, hypertension, diabetes mellitus type II, atrial fibrillation, hyperlipidemia, gout, GERD, and diverticulosis who is admitted to [facility] on 10/07/2022 for Colitis [K52.9] Acute colitis [K52.9]  1.  ESRD on peritoneal dialysis.  Patient reports that he did not perform treatment for 4 days prior to admission.  - Peritoneal dialysis received overnight, UF -88ml.   2.  Pancolitis.   - Continue ciprofloxacin and metronidazole.   - supportive treatment.  3.  Hypertension: urgency on admission.  - Continue amlodipine, losartan, isosorbide mononitrate and metoprolol  4.  Secondary hyperparathyroidism. - Continue Calcitrol 0.25 mcg p.o. daily.  5. Diabetes mellitus type II with chronic kidney disease - Glucose well controlled.   LOS: 4   9/16/202411:22 AM

## 2022-10-11 NOTE — Progress Notes (Signed)
[facility] Cardiology Progress Note  SUBJECTIVE: Patient feeling better today, still with abdominal pain but reports this is much improved. No episodes of vomiting today. Continues to deny any chest pain, shortness of breath, or palpitations.    Vitals:   10/10/22 2107 10/11/22 0409 10/11/22 0500 10/11/22 0757  BP: (!) 146/82 (!) 152/86  (!) 147/81  Pulse: 61 66  64  Resp: 16 20  18   Temp: 98.4 F (36.9 C) 98.1 F (36.7 C)  (!) 97.5 F (36.4 C)  TempSrc: Oral Oral    SpO2: 96% 96%  96%  Weight:   58.7 kg   Height:         Intake/Output Summary (Last 24 hours) at 10/11/2022 1031 Last data filed at 10/11/2022 0753 Gross per 24 hour  Intake 578 ml  Output 700 ml  Net -122 ml      PHYSICAL EXAM  General: Well developed, well nourished, in no acute distress HEENT:  Normocephalic and atramatic Neck:  No JVD.  Lungs: Clear bilaterally to auscultation and percussion. Heart: HRRR . Normal S1 and S2 without gallops or murmurs.  Abdomen: Bowel sounds are positive, abdomen soft and mildly tender Msk:  Back normal, normal gait. Normal strength and tone for age. Extremities: No clubbing, cyanosis or edema.   Neuro: Alert and oriented X 3. Psych:  Good affect, responds appropriately   LABS: Basic Metabolic Panel: Recent Labs    10/09/22 0812 10/10/22 0528 10/11/22 0509  NA 136 137 138  K 3.0* 3.0* 3.4*  CL 102 102 106  CO2 22 22 22   GLUCOSE 87 101* 82  BUN 43* 39* 33*  CREATININE 6.92* 6.41* 6.16*  CALCIUM 7.3* 7.6* 7.7*  MG 1.5* 2.0  --    Liver Function Tests: No results for input(s): "AST", "ALT", "ALKPHOS", "BILITOT", "PROT", "ALBUMIN" in the last 72 hours.  No results for input(s): "LIPASE", "AMYLASE" in the last 72 hours. CBC: No results for input(s): "WBC", "NEUTROABS", "HGB", "HCT", "MCV", "PLT" in the last 72 hours.  Cardiac Enzymes: No results for input(s): "CKTOTAL", "CKMB", "CKMBINDEX", "TROPONINI" in the last 72 hours. BNP: Invalid input(s):  "POCBNP" D-Dimer: No results for input(s): "DDIMER" in the last 72 hours. Hemoglobin A1C: No results for input(s): "HGBA1C" in the last 72 hours. Fasting Lipid Panel: No results for input(s): "CHOL", "HDL", "LDLCALC", "TRIG", "CHOLHDL", "LDLDIRECT" in the last 72 hours. Thyroid Function Tests: No results for input(s): "TSH", "T4TOTAL", "T3FREE", "THYROIDAB" in the last 72 hours.  Invalid input(s): "FREET3" Anemia Panel: No results for input(s): "VITAMINB12", "FOLATE", "FERRITIN", "TIBC", "IRON", "RETICCTPCT" in the last 72 hours.  No results found.   Echo LVEF 60-65%  TELEMETRY Reviewed (10/11/22): not on tele  Data reviewed (10/11/22): last 24h vitals tele labs imaging I/O hospitalist progress notes  ASSESSMENT AND PLAN:  Principal Problem:   Acute colitis Active Problems:   DM type 2 (diabetes mellitus, type 2) (HCC)   CAD S/P percutaneous coronary angioplasty   Chest pain   ESRD on peritoneal dialysis (HCC)   PAF (paroxysmal atrial fibrillation) (HCC) on chronic anticoagulation   Hypertensive urgency, malignant   Elevated troponin   High anion gap metabolic acidosis    1. Coronary artery disease, history of inferior STEMI in [place] DES RCA 10/29 2003, DES mid LAD 07/21/2015, currently without chest pain or ischemic ECG changes 2.  Borderline elevated troponin, likely demand supply ischemia, secondary to pancolitis and hypertensive urgency 3.  Pancolitis, with persistent nausea and abdominal pain 4.  Hypertensive urgency, blood  pressure improved, but still elevated, likely due to abdominal discomfort and persistent nausea, on metoprolol succinate, losartan, and amlodipine 5.  Paroxysmal atrial fibrillation, on low-dose Eliquis, with chronic anemia, followed by hematology, undergoes chronic iron infusions 6.  Hyperlipidemia, on rosuvastatin   Recommendations   1.  Agree with current plan 2.  Continue low-dose Eliquis for stroke prevention (dose appropriate given weight  <60 kg and Cr >1.5) 3.  Continue metoprolol succinate, amlodipine, and losartan for blood pressure control 4.  Continue isosorbide mononitrate 5.  No further cardiac diagnostics at this time  Cardiology will sign off. Please haiku with questions or re-engage if needed.    This patient's plan of care was discussed and created with Dr. Melton Alar and she is in agreement.    SignedGale Journey, PA-C  10/11/2022 10:31 AM

## 2022-10-11 NOTE — Discharge Summary (Addendum)
Physician Discharge Summary   Patient: Casey French MRN: [id number] DOB: 20-Jan-1951  Admit date:     10/07/2022  Discharge date: 10/11/22  Discharge Physician: Lurene Shadow   PCP: Jerl Mina, MD   Recommendations at discharge:   Follow-up with PCP in 1 week  Discharge Diagnoses: Principal Problem:   Acute colitis Active Problems:   CAD S/P percutaneous coronary angioplasty   Chest pain   Elevated troponin   ESRD on peritoneal dialysis (HCC)   High anion gap metabolic acidosis   Hypertensive urgency, malignant   PAF (paroxysmal atrial fibrillation) (HCC) on chronic anticoagulation   DM type 2 (diabetes mellitus, type 2) (HCC)  Resolved Problems:   * No resolved hospital problems. *  Hospital Course:  Casey French is a 72 y.o. male  with medical history significant for CAD s/p stent angioplasty LAD and RCA, DM, HTN, A-fib on Eliquis, history of GI bleed , history of diverticulitis, ESRD on PD who still produces urine, hospitalized in June 2024 with altered mental status and hypertensive emergency related to missing dialysis for 4 days and required urgent dialysis at that time.  He presented to the hospital because of general weakness, vomiting, abdominal pain and chest pain.  His BP was 220/110 when EMS picked him up.  In the ED, his BP was 209/100.  CT abdomen showed severe pancolitis in the sigmoid colon.     He was admitted to the hospital for hypertensive urgency and sigmoid colon pancolitis.      Assessment and Plan:  Acute sigmoid colon pancolitis: Improved.  He will be discharged on ciprofloxacin and Flagyl for 4 more days.      Hypertensive urgency: BP is better.  Continue antihypertensives.  (Amlodipine, losartan, metoprolol and isosorbide mononitrate)     ESRD on PD at home: Continue peritoneal dialysis at home and follow-up with nephrologist.     Hypokalemia: Improved     Paroxysmal atrial fibrillation: Continue Eliquis     Mildly  elevated and flat troponins and chest pain in a patient with CAD s/p RCA (2003) and LAD (2017) stents: Chest pain has resolved.  Continue Eliquis.  No further cardiac diagnostics per cardiologist. 2D echo showed EF estimated at 60 to 65%, grade 1 diastolic dysfunction.     Type II DM: NovoLog as needed for hyperglycemia.   His condition has improved and is deemed stable for discharge to home today.        Consultants: Nephrologist, cardiologist Procedures performed: None Disposition: Home Diet recommendation:  Discharge Diet Orders (From admission, onward)     Start     Ordered   10/11/22 0000  Diet - low sodium heart healthy        10/11/22 1404           Renal diet DISCHARGE MEDICATION: Allergies as of 10/11/2022       Reactions   Morphine Anxiety   agitation        Medication List     TAKE these medications    acetaminophen 325 MG tablet Commonly known as: TYLENOL Take 2 tablets (650 mg total) by mouth every 6 (six) hours as needed for mild pain (or Fever >/= 101).   allopurinol 100 MG tablet Commonly known as: ZYLOPRIM Take 100 mg by mouth daily.   amLODipine 10 MG tablet Commonly known as: NORVASC Take 1 tablet (10 mg total) by mouth daily.   apixaban 2.5 MG Tabs tablet Commonly known as: ELIQUIS Take 2.5 mg by mouth  2 (two) times daily.   aspirin EC 81 MG tablet Take 81 mg by mouth daily.   calcitRIOL 0.25 MCG capsule Commonly known as: ROCALTROL Take 1 capsule (0.25 mcg total) by mouth daily.   ciprofloxacin 250 MG tablet Commonly known as: CIPRO Take 1 tablet (250 mg total) by mouth daily for 4 days.   escitalopram 10 MG tablet Commonly known as: LEXAPRO Take 10 mg by mouth daily.   folic acid 1 MG tablet Commonly known as: FOLVITE Take 1 tablet (1 mg total) by mouth daily. Take 1 tablet daily What changed: See the new instructions.   gabapentin 300 MG capsule Commonly known as: NEURONTIN Take 300 mg by mouth at bedtime.    hydrocortisone 2.5 % rectal cream Commonly known as: ANUSOL-HC Place 1 application rectally 2 (two) times daily as needed for hemorrhoids or anal itching.   isosorbide mononitrate 60 MG 24 hr tablet Commonly known as: IMDUR Take 60 mg by mouth daily.   losartan 100 MG tablet Commonly known as: COZAAR Take 1 tablet (100 mg total) by mouth daily.   meclizine 25 MG tablet Commonly known as: ANTIVERT Take 1 tablet (25 mg total) by mouth 2 (two) times daily.   methocarbamol 500 MG tablet Commonly known as: ROBAXIN Take 500 mg by mouth 3 (three) times daily as needed.   metoprolol succinate 100 MG 24 hr tablet Commonly known as: TOPROL-XL Take 100 mg by mouth daily. Take with or immediately following a meal.   metroNIDAZOLE 500 MG tablet Commonly known as: FLAGYL Take 1 tablet (500 mg total) by mouth every 12 (twelve) hours for 4 days.   nitroGLYCERIN 0.4 MG SL tablet Commonly known as: NITROSTAT Place 0.4 mg under the tongue every 5 (five) minutes as needed for chest pain.   omeprazole 20 MG capsule Commonly known as: PRILOSEC Take 20 mg by mouth daily.   psyllium 58.6 % packet Commonly known as: METAMUCIL Take 1 packet by mouth 2 (two) times daily.   rosuvastatin 40 MG tablet Commonly known as: CRESTOR Take 40 mg by mouth daily.   Vitamin D (Ergocalciferol) 1.25 MG (50000 UNIT) Caps capsule Commonly known as: DRISDOL Take 50,000 Units by mouth once a week.               Discharge Care Instructions  (From admission, onward)           Start     Ordered   10/11/22 0000  Discharge wound care:       Comments: Peritoneal dialysis catheter exit site care:  Clean skin near exit site with chloraprep swab sticks.  Starting at catheter, use circular pattern around exit site, moving towards outer edges of area covered by dressing.  Apply gentamicin cream to site once daily.  Cover with dry dressing.   10/11/22 1404            Discharge Exam: Filed Weights    10/09/22 2009 10/10/22 0429 10/11/22 0500  Weight: 57 kg 56.9 kg 58.7 kg   GEN: NAD SKIN: Warm and dry EYES: No pallor or icterus ENT: MMM CV: RRR PULM: CTA B ABD: soft, ND, NT, +BS, +PD catheter in left side of abdomen CNS: AAO x 3, non focal EXT: No edema or tenderness   Condition at discharge: good  The results of significant diagnostics from this hospitalization (including imaging, microbiology, ancillary and laboratory) are listed below for reference.   Imaging Studies: ECHOCARDIOGRAM COMPLETE  Result Date: 10/09/2022    ECHOCARDIOGRAM REPORT  Patient Name:   KEIFFER SHORTALL Date of Exam: 10/09/2022 Medical Rec #:  [id number]           Height:       69.0 in Accession #:    [id number]          Weight:       126.3 lb Date of Birth:  24-Jun-1950           BSA:          1.700 m Patient Age:    72 years            BP:           136/78 mmHg Patient Gender: M                   HR:           62 bpm. Exam Location:  [facility] Procedure: 2D Echo Indications:     Chest Pain R07.9  History:         Patient has prior history of Echocardiogram examinations, most                  recent 12/03/2020.  Sonographer:     Overton Mam RDCS, FASE Referring Phys:  [id number] Andris Baumann Diagnosing Phys: Marcina Millard MD IMPRESSIONS  1. Left ventricular ejection fraction, by estimation, is 60 to 65%. The left ventricle has normal function. The left ventricle has no regional wall motion abnormalities. Left ventricular diastolic parameters are consistent with Grade I diastolic dysfunction (impaired relaxation).  2. Right ventricular systolic function is normal. The right ventricular size is normal.  3. The mitral valve is normal in structure. Trivial mitral valve regurgitation. No evidence of mitral stenosis.  4. The aortic valve is normal in structure. Aortic valve regurgitation is not visualized. No aortic stenosis is present.  5. The inferior vena cava is normal in size with greater than 50% respiratory  variability, suggesting right atrial pressure of 3 mmHg. FINDINGS  Left Ventricle: Left ventricular ejection fraction, by estimation, is 60 to 65%. The left ventricle has normal function. The left ventricle has no regional wall motion abnormalities. The left ventricular internal cavity size was normal in size. There is  no left ventricular hypertrophy. Left ventricular diastolic parameters are consistent with Grade I diastolic dysfunction (impaired relaxation). Right Ventricle: The right ventricular size is normal. No increase in right ventricular wall thickness. Right ventricular systolic function is normal. Left Atrium: Left atrial size was normal in size. Right Atrium: Right atrial size was normal in size. Pericardium: There is no evidence of pericardial effusion. Mitral Valve: The mitral valve is normal in structure. Trivial mitral valve regurgitation. No evidence of mitral valve stenosis. Tricuspid Valve: The tricuspid valve is normal in structure. Tricuspid valve regurgitation is trivial. No evidence of tricuspid stenosis. Aortic Valve: The aortic valve is normal in structure. Aortic valve regurgitation is not visualized. No aortic stenosis is present. Aortic valve peak gradient measures 13.0 mmHg. Pulmonic Valve: The pulmonic valve was normal in structure. Pulmonic valve regurgitation is not visualized. No evidence of pulmonic stenosis. Aorta: The aortic root is normal in size and structure. Venous: The inferior vena cava is normal in size with greater than 50% respiratory variability, suggesting right atrial pressure of 3 mmHg. IAS/Shunts: No atrial level shunt detected by color flow Doppler.  LEFT VENTRICLE PLAX 2D LVIDd:         3.80 cm   Diastology LVIDs:  2.20 cm   LV e' medial:    8.05 cm/s LV PW:         1.10 cm   LV E/e' medial:  11.8 LV IVS:        1.20 cm   LV e' lateral:   14.70 cm/s LVOT diam:     1.80 cm   LV E/e' lateral: 6.5 LV SV:         71 LV SV Index:   42 LVOT Area:     2.54 cm   RIGHT VENTRICLE RV Basal diam:  2.20 cm RV S prime:     24.80 cm/s TAPSE (M-mode): 2.4 cm LEFT ATRIUM             Index        RIGHT ATRIUM           Index LA diam:        3.20 cm 1.88 cm/m   RA Area:     14.60 cm LA Vol (A2C):   29.8 ml 17.53 ml/m  RA Volume:   36.60 ml  21.54 ml/m LA Vol (A4C):   50.9 ml 29.95 ml/m LA Biplane Vol: 41.4 ml 24.36 ml/m  AORTIC VALVE                 PULMONIC VALVE AV Area (Vmax): 1.97 cm     PV Vmax:       1.14 m/s AV Vmax:        180.00 cm/s  PV Peak grad:  5.2 mmHg AV Peak Grad:   13.0 mmHg LVOT Vmax:      139.00 cm/s LVOT Vmean:     91.900 cm/s LVOT VTI:       0.278 m  AORTA Ao Root diam: 4.00 cm Ao Asc diam:  3.30 cm MITRAL VALVE                TRICUSPID VALVE MV Area (PHT): 2.55 cm     TR Peak grad:   26.0 mmHg MV Decel Time: 297 msec     TR Vmax:        255.00 cm/s MV E velocity: 94.90 cm/s MV A velocity: 108.00 cm/s  SHUNTS MV E/A ratio:  0.88         Systemic VTI:  0.28 m                             Systemic Diam: 1.80 cm Marcina Millard MD Electronically signed by Marcina Millard MD Signature Date/Time: 10/09/2022/11:12:11 AM    Final    CT ABDOMEN PELVIS W CONTRAST  Result Date: 10/07/2022 CLINICAL DATA:  Abdominal pain with vomiting EXAM: CT ABDOMEN AND PELVIS WITH CONTRAST TECHNIQUE: Multidetector CT imaging of the abdomen and pelvis was performed using the standard protocol following bolus administration of intravenous contrast. RADIATION DOSE REDUCTION: This exam was performed according to the departmental dose-optimization program which includes automated exposure control, adjustment of the mA and/or kV according to patient size and/or use of iterative reconstruction technique. CONTRAST:  OMNIPAQUE IOHEXOL 300 MG/ML  SOLN COMPARISON:  CT chest abdomen and pelvis 04/18/2022 FINDINGS: Lower chest: There is atelectasis in the lung bases. Hepatobiliary: No focal liver abnormality is seen. No gallstones, gallbladder wall thickening, or biliary  dilatation. Pancreas: Unremarkable. No pancreatic ductal dilatation or surrounding inflammatory changes. Spleen: Normal in size without focal abnormality. Adrenals/Urinary Tract: There is a 2 cm cyst in the right kidney. There is  mild nonspecific bilateral perinephric fat stranding. There is a subcentimeter rounded hypodensity in the lower pole the left kidney which is too small to characterize, but favored as cysts. There is no hydronephrosis. The adrenal glands and bladder are within normal limits. Stomach/Bowel: There is diffuse wall thickening of the colon, severe in the sigmoid colon. There is colonic diverticulosis as well without evidence for acute diverticulitis. The appendix is within normal limits. No bowel obstruction, pneumatosis or free air. The stomach is within normal limits. Vascular/Lymphatic: Aortic atherosclerosis. No enlarged abdominal or pelvic lymph nodes. Reproductive: Prostate is unremarkable. Other: No abdominal wall hernia or abnormality. No abdominopelvic ascites. Musculoskeletal: No acute or significant osseous findings. Left hip arthroplasty is present. IMPRESSION: 1. Pancolitis, severe in the sigmoid colon. 2. Colonic diverticulosis without evidence for acute diverticulitis. 3. Bilateral perinephric fat stranding, nonspecific. Correlate clinically for infection. Electronically Signed   By: Darliss Cheney M.D.   On: 10/07/2022 19:31   DG Chest Port 1 View  Result Date: 10/07/2022 CLINICAL DATA:  Weakness vomiting and chest pain EXAM: PORTABLE CHEST 1 VIEW COMPARISON:  06/26/2022 FINDINGS: No acute airspace disease or pleural effusion. Linear atelectasis or scar at the right base. Stable cardiomediastinal silhouette. No pneumothorax IMPRESSION: No active disease. Linear atelectasis or scar at the right base. Electronically Signed   By: Jasmine Pang M.D.   On: 10/07/2022 16:44    Microbiology: Results for orders placed or performed during the hospital encounter of 06/26/22  SARS  Coronavirus 2 by RT PCR (hospital order, performed in [facility] hospital lab) *cepheid single result test* Anterior Nasal Swab     Status: None   Collection Time: 06/26/22  5:13 PM   Specimen: Anterior Nasal Swab  Result Value Ref Range Status   SARS Coronavirus 2 by RT PCR NEGATIVE NEGATIVE Final    Comment: (NOTE) SARS-CoV-2 target nucleic acids are NOT DETECTED.  The SARS-CoV-2 RNA is generally detectable in upper and lower respiratory specimens during the acute phase of infection. The lowest concentration of SARS-CoV-2 viral copies this assay can detect is 250 copies / mL. A negative result does not preclude SARS-CoV-2 infection and should not be used as the sole basis for treatment or other patient management decisions.  A negative result may occur with improper specimen collection / handling, submission of specimen other than nasopharyngeal swab, presence of viral mutation(s) within the areas targeted by this assay, and inadequate number of viral copies (<250 copies / mL). A negative result must be combined with clinical observations, patient history, and epidemiological information.  Fact Sheet for Patients:   RoadLapTop.co.za  Fact Sheet for Healthcare Providers: http://kim-miller.com/  This test is not yet approved or  cleared by the [place] FDA and has been authorized for detection and/or diagnosis of SARS-CoV-2 by FDA under an Emergency Use Authorization (EUA).  This EUA will remain in effect (meaning this test can be used) for the duration of the COVID-19 declaration under Section 564(b)(1) of the Act, 21 U.S.C. section 360bbb-3(b)(1), unless the authorization is terminated or revoked sooner.  Performed at [facility], [address] [place], [place] [address]   MRSA Next Gen by PCR, Nasal     Status: None   Collection Time: 06/26/22  5:31 PM   Specimen: Nasal Mucosa; Nasal Swab  Result Value Ref Range  Status   MRSA by PCR Next Gen NOT DETECTED NOT DETECTED Final    Comment: (NOTE) The GeneXpert MRSA Assay (FDA approved for NASAL specimens only), is  one component of a comprehensive MRSA colonization surveillance program. It is not intended to diagnose MRSA infection nor to guide or monitor treatment for MRSA infections. Test performance is not FDA approved in patients less than 84 years old. Performed at [facility], [address] [place], [place] [address]     Labs: CBC: Recent Labs  Lab 10/07/22 1507 10/08/22 0501  WBC 9.4 9.7  NEUTROABS 8.7*  --   HGB 14.3 13.3  HCT 44.2 40.1  MCV 91.9 90.7  PLT 168 172   Basic Metabolic Panel: Recent Labs  Lab 10/07/22 1507 10/07/22 1510 10/08/22 0501 10/09/22 0812 10/10/22 0528 10/11/22 0509  NA 139  --  142 136 137 138  K 3.9  --  3.7 3.0* 3.0* 3.4*  CL 108  --  111 102 102 106  CO2 15*  --  21* 22 22 22   GLUCOSE 143*  --  92 87 101* 82  BUN 42*  --  48* 43* 39* 33*  CREATININE 6.83*  --  7.09* 6.92* 6.41* 6.16*  CALCIUM 8.6*  --  8.3* 7.3* 7.6* 7.7*  MG  --  1.8  --  1.5* 2.0  --    Liver Function Tests: Recent Labs  Lab 10/07/22 1507  AST 34  ALT 25  ALKPHOS 90  BILITOT 0.9  PROT 6.1*  ALBUMIN 3.1*   CBG: Recent Labs  Lab 10/10/22 1234 10/10/22 1655 10/10/22 2133 10/11/22 0759 10/11/22 1147  GLUCAP 105* 97 152* 87 124*    Discharge time spent: greater than 30 minutes.  Signed: Lurene Shadow, MD [facility] 10/11/2022

## 2022-11-11 ENCOUNTER — Other Ambulatory Visit: Payer: Self-pay | Admitting: *Deleted

## 2022-11-11 DIAGNOSIS — N400 Enlarged prostate without lower urinary tract symptoms: Secondary | ICD-10-CM

## 2022-11-12 ENCOUNTER — Other Ambulatory Visit: Payer: Medicare PPO

## 2022-11-17 ENCOUNTER — Encounter: Payer: Self-pay | Admitting: Urology

## 2022-11-17 ENCOUNTER — Ambulatory Visit: Payer: Medicare PPO | Admitting: Urology

## 2022-11-17 VITALS — BP 141/83 | HR 68 | Ht 69.0 in | Wt 119.0 lb

## 2022-11-17 DIAGNOSIS — Z125 Encounter for screening for malignant neoplasm of prostate: Secondary | ICD-10-CM

## 2022-11-17 DIAGNOSIS — N400 Enlarged prostate without lower urinary tract symptoms: Secondary | ICD-10-CM

## 2022-11-17 DIAGNOSIS — R1030 Lower abdominal pain, unspecified: Secondary | ICD-10-CM

## 2022-11-17 LAB — URINALYSIS, COMPLETE
Bilirubin, UA: NEGATIVE
Ketones, UA: NEGATIVE
Leukocytes,UA: NEGATIVE
Nitrite, UA: NEGATIVE
Specific Gravity, UA: 1.02 (ref 1.005–1.030)
Urobilinogen, Ur: 0.2 mg/dL (ref 0.2–1.0)
pH, UA: 7 (ref 5.0–7.5)

## 2022-11-17 LAB — MICROSCOPIC EXAMINATION

## 2022-11-17 NOTE — Progress Notes (Signed)
I, Maysun Anabel Bene, acting as a scribe for Riki Altes, MD., have documented all relevant documentation on the behalf of Riki Altes, MD, as directed by Riki Altes, MD while in the presence of Riki Altes, MD.  11/17/2022 4:22 PM   Casey French 1950/05/03 [id number]  Referring provider: Jerl Mina, MD [address] [facility] [place],  [place] [address]  Chief Complaint  Patient presents with   Elevated PSA   Urologic history: 1. Elevated PSA PSA 10/24/2020 2.73 and has fluctuated since 2016   HPI: Casey French is a 72 y.o. male presents for follow-up visit.   Initially seen 04/08/21 for a PSA of 3.05 with baseline PSA ranging mid 1-upper 2 range. DRE was benign and it was felt the PSA of 3.05 was not significant. 6 month follow-up PSA was recommended, however he has not been seen in the last year. For 3 months he has had mild lower abdominal supubic burning sensation and a low back pain.  No bothersome LUTS Denies gross hematuria. Voiding pattern is stable.   PMH: Past Medical History:  Diagnosis Date   Acute diverticulitis 06/14/2014   Anemia    Anginal pain (HCC)    Aortic atherosclerosis (HCC)    Atrial fibrillation (HCC)    CKD (chronic kidney disease) stage 3, GFR 30-59 ml/min (HCC)    Coronary artery disease    Depression    Diabetes mellitus without complication (HCC)    Diverticulosis    GERD (gastroesophageal reflux disease)    GI bleed 05/15/2019   Gout    Heart murmur    Hematemesis 05/15/2019   High cholesterol    Hypertension    STEMI (ST elevation myocardial infarction) (HCC) 2003   Inferior    Surgical History: Past Surgical History:  Procedure Laterality Date   CAPD INSERTION N/A 08/13/2020   Procedure: LAPAROSCOPIC INSERTION CONTINUOUS AMBULATORY PERITONEAL DIALYSIS  (CAPD) CATHETER;  Surgeon: Campbell Lerner, MD;  Location: [facility] ORS;  Service: General;  Laterality: N/A;   CARDIAC CATHETERIZATION      CATARACT EXTRACTION W/PHACO Right 07/05/2019   Procedure: CATARACT EXTRACTION PHACO AND INTRAOCULAR LENS PLACEMENT (IOC) RIGHT DIABETIC;  Surgeon: Galen Manila, MD;  Location: [facility] ORS;  Service: Ophthalmology;  Laterality: Right;  US00:59.3 CDE8.41 LOT [id number] h   COLONOSCOPY WITH PROPOFOL N/A 04/01/2017   Procedure: COLONOSCOPY WITH PROPOFOL;  Surgeon: Christena Deem, MD;  Location: [facility] ENDOSCOPY;  Service: Endoscopy;  Laterality: N/A;   CORONARY ANGIOPLASTY WITH STENT PLACEMENT  10/2001   RCA   CORONARY ANGIOPLASTY WITH STENT PLACEMENT  07/2015   LAD   right hand surgery  2015   3 screws in right hand   TOTAL HIP ARTHROPLASTY Left 04/22/2020   Procedure: TOTAL HIP ARTHROPLASTY ANTERIOR APPROACH;  Surgeon: Kennedy Bucker, MD;  Location: [facility] ORS;  Service: Orthopedics;  Laterality: Left;    Home Medications:  Allergies as of 11/17/2022       Reactions   Morphine Anxiety   agitation        Medication List        Accurate as of November 17, 2022  4:22 PM. If you have any questions, ask your nurse or doctor.          acetaminophen 325 MG tablet Commonly known as: TYLENOL Take 2 tablets (650 mg total) by mouth every 6 (six) hours as needed for mild pain (or Fever >/= 101).   allopurinol 100 MG tablet Commonly known  as: ZYLOPRIM Take 100 mg by mouth daily.   amLODipine 10 MG tablet Commonly known as: NORVASC Take 1 tablet (10 mg total) by mouth daily.   apixaban 2.5 MG Tabs tablet Commonly known as: ELIQUIS Take 2.5 mg by mouth 2 (two) times daily.   aspirin EC 81 MG tablet Take 81 mg by mouth daily.   calcitRIOL 0.25 MCG capsule Commonly known as: ROCALTROL Take 1 capsule (0.25 mcg total) by mouth daily.   escitalopram 10 MG tablet Commonly known as: LEXAPRO Take 10 mg by mouth daily.   folic acid 1 MG tablet Commonly known as: FOLVITE Take 1 tablet (1 mg total) by mouth daily. Take 1 tablet daily   gabapentin 300 MG capsule Commonly known as:  NEURONTIN Take 300 mg by mouth at bedtime.   hydrocortisone 2.5 % rectal cream Commonly known as: ANUSOL-HC Place 1 application rectally 2 (two) times daily as needed for hemorrhoids or anal itching.   isosorbide mononitrate 60 MG 24 hr tablet Commonly known as: IMDUR Take 60 mg by mouth daily.   losartan 100 MG tablet Commonly known as: COZAAR Take 1 tablet (100 mg total) by mouth daily.   meclizine 25 MG tablet Commonly known as: ANTIVERT Take 1 tablet (25 mg total) by mouth 2 (two) times daily.   methocarbamol 500 MG tablet Commonly known as: ROBAXIN Take 500 mg by mouth 3 (three) times daily as needed.   metoprolol succinate 100 MG 24 hr tablet Commonly known as: TOPROL-XL Take 100 mg by mouth daily. Take with or immediately following a meal.   nitroGLYCERIN 0.4 MG SL tablet Commonly known as: NITROSTAT Place 0.4 mg under the tongue every 5 (five) minutes as needed for chest pain.   omeprazole 20 MG capsule Commonly known as: PRILOSEC Take 20 mg by mouth daily.   psyllium 58.6 % packet Commonly known as: METAMUCIL Take 1 packet by mouth 2 (two) times daily.   rosuvastatin 40 MG tablet Commonly known as: CRESTOR Take 40 mg by mouth daily.   Vitamin D (Ergocalciferol) 1.25 MG (50000 UNIT) Caps capsule Commonly known as: DRISDOL Take 50,000 Units by mouth once a week.        Allergies:  Allergies  Allergen Reactions   Morphine Anxiety    agitation     Family History: Family History  Problem Relation Age of Onset   Heart disease Mother    Heart disease Father    CAD Other     Social History:  reports that he quit smoking about 21 years ago. His smoking use included cigarettes. He has never used smokeless tobacco. He reports that he does not drink alcohol and does not use drugs.   Physical Exam: BP (!) 141/83   Pulse 68   Ht 5\' 9"  (1.753 m)   Wt 119 lb (54 kg)   BMI 17.57 kg/m   Constitutional:  Alert and oriented, No acute distress. HEENT:  [place] AT, moist mucus membranes.  Trachea midline, no masses. Cardiovascular: No clubbing, cyanosis, or edema. Respiratory: Normal respiratory effort, no increased work of breathing. GI: Abdomen is soft, nontender, nondistended, no abdominal masses Skin: No rashes, bruises or suspicious lesions. Neurologic: Grossly intact, no focal deficits, moving all 4 extremities. Psychiatric: Normal mood and affect.  Assessment & Plan:    1. Prostate cancer screening Repeat PSA today and he will be notified with the results.   2. Lower abdominal pain He has an appointment with Dr. Burnett Sheng next week for further evaluation.  Urinalysis was ordered. He will be notified with results and further recommendations if any abnormalities are noted on UA.  [facility] [address], [address] [place], [place] [address] [phone]

## 2022-11-18 ENCOUNTER — Encounter: Payer: Self-pay | Admitting: Urology

## 2022-11-18 ENCOUNTER — Other Ambulatory Visit: Payer: Self-pay | Admitting: *Deleted

## 2022-11-18 DIAGNOSIS — N2889 Other specified disorders of kidney and ureter: Secondary | ICD-10-CM

## 2022-11-18 LAB — PSA: Prostate Specific Ag, Serum: 3.7 ng/mL (ref 0.0–4.0)

## 2022-12-06 ENCOUNTER — Encounter: Payer: Self-pay | Admitting: Oncology

## 2022-12-07 ENCOUNTER — Ambulatory Visit: Admission: RE | Admit: 2022-12-07 | Payer: Medicare PPO | Source: Ambulatory Visit

## 2022-12-15 ENCOUNTER — Ambulatory Visit
Admission: RE | Admit: 2022-12-15 | Discharge: 2022-12-15 | Disposition: A | Discharge status: Home / Self Care | Payer: Medicare PPO | Source: Ambulatory Visit | Attending: Urology | Admitting: Urology

## 2022-12-15 DIAGNOSIS — N2889 Other specified disorders of kidney and ureter: Secondary | ICD-10-CM | POA: Insufficient documentation

## 2023-01-26 DIAGNOSIS — Z992 Dependence on renal dialysis: Secondary | ICD-10-CM | POA: Diagnosis not present

## 2023-01-26 DIAGNOSIS — N186 End stage renal disease: Secondary | ICD-10-CM | POA: Diagnosis not present

## 2023-01-27 DIAGNOSIS — Z992 Dependence on renal dialysis: Secondary | ICD-10-CM | POA: Diagnosis not present

## 2023-01-27 DIAGNOSIS — N186 End stage renal disease: Secondary | ICD-10-CM | POA: Diagnosis not present

## 2023-01-29 DIAGNOSIS — N186 End stage renal disease: Secondary | ICD-10-CM | POA: Diagnosis not present

## 2023-01-29 DIAGNOSIS — Z992 Dependence on renal dialysis: Secondary | ICD-10-CM | POA: Diagnosis not present

## 2023-01-30 DIAGNOSIS — N186 End stage renal disease: Secondary | ICD-10-CM | POA: Diagnosis not present

## 2023-01-30 DIAGNOSIS — Z992 Dependence on renal dialysis: Secondary | ICD-10-CM | POA: Diagnosis not present

## 2023-01-31 DIAGNOSIS — Z5181 Encounter for therapeutic drug level monitoring: Secondary | ICD-10-CM | POA: Diagnosis not present

## 2023-01-31 DIAGNOSIS — E119 Type 2 diabetes mellitus without complications: Secondary | ICD-10-CM | POA: Diagnosis not present

## 2023-01-31 DIAGNOSIS — N186 End stage renal disease: Secondary | ICD-10-CM | POA: Diagnosis not present

## 2023-01-31 DIAGNOSIS — E785 Hyperlipidemia, unspecified: Secondary | ICD-10-CM | POA: Diagnosis not present

## 2023-01-31 DIAGNOSIS — Z79899 Other long term (current) drug therapy: Secondary | ICD-10-CM | POA: Diagnosis not present

## 2023-01-31 DIAGNOSIS — Z794 Long term (current) use of insulin: Secondary | ICD-10-CM | POA: Diagnosis not present

## 2023-01-31 DIAGNOSIS — Z992 Dependence on renal dialysis: Secondary | ICD-10-CM | POA: Diagnosis not present

## 2023-02-01 DIAGNOSIS — Z992 Dependence on renal dialysis: Secondary | ICD-10-CM | POA: Diagnosis not present

## 2023-02-01 DIAGNOSIS — N186 End stage renal disease: Secondary | ICD-10-CM | POA: Diagnosis not present

## 2023-02-02 DIAGNOSIS — Z992 Dependence on renal dialysis: Secondary | ICD-10-CM | POA: Diagnosis not present

## 2023-02-02 DIAGNOSIS — N186 End stage renal disease: Secondary | ICD-10-CM | POA: Diagnosis not present

## 2023-02-03 DIAGNOSIS — Z992 Dependence on renal dialysis: Secondary | ICD-10-CM | POA: Diagnosis not present

## 2023-02-03 DIAGNOSIS — N186 End stage renal disease: Secondary | ICD-10-CM | POA: Diagnosis not present

## 2023-02-04 DIAGNOSIS — N186 End stage renal disease: Secondary | ICD-10-CM | POA: Diagnosis not present

## 2023-02-04 DIAGNOSIS — Z992 Dependence on renal dialysis: Secondary | ICD-10-CM | POA: Diagnosis not present

## 2023-02-05 DIAGNOSIS — Z992 Dependence on renal dialysis: Secondary | ICD-10-CM | POA: Diagnosis not present

## 2023-02-05 DIAGNOSIS — N186 End stage renal disease: Secondary | ICD-10-CM | POA: Diagnosis not present

## 2023-02-06 DIAGNOSIS — N186 End stage renal disease: Secondary | ICD-10-CM | POA: Diagnosis not present

## 2023-02-06 DIAGNOSIS — Z992 Dependence on renal dialysis: Secondary | ICD-10-CM | POA: Diagnosis not present

## 2023-02-07 DIAGNOSIS — Z992 Dependence on renal dialysis: Secondary | ICD-10-CM | POA: Diagnosis not present

## 2023-02-07 DIAGNOSIS — N186 End stage renal disease: Secondary | ICD-10-CM | POA: Diagnosis not present

## 2023-02-08 DIAGNOSIS — Z992 Dependence on renal dialysis: Secondary | ICD-10-CM | POA: Diagnosis not present

## 2023-02-08 DIAGNOSIS — N186 End stage renal disease: Secondary | ICD-10-CM | POA: Diagnosis not present

## 2023-02-09 ENCOUNTER — Encounter: Payer: Self-pay | Admitting: Oncology

## 2023-02-09 DIAGNOSIS — N186 End stage renal disease: Secondary | ICD-10-CM | POA: Diagnosis not present

## 2023-02-09 DIAGNOSIS — Z992 Dependence on renal dialysis: Secondary | ICD-10-CM | POA: Diagnosis not present

## 2023-02-10 DIAGNOSIS — Z992 Dependence on renal dialysis: Secondary | ICD-10-CM | POA: Diagnosis not present

## 2023-02-10 DIAGNOSIS — N186 End stage renal disease: Secondary | ICD-10-CM | POA: Diagnosis not present

## 2023-02-11 DIAGNOSIS — Z992 Dependence on renal dialysis: Secondary | ICD-10-CM | POA: Diagnosis not present

## 2023-02-11 DIAGNOSIS — N186 End stage renal disease: Secondary | ICD-10-CM | POA: Diagnosis not present

## 2023-02-12 DIAGNOSIS — Z992 Dependence on renal dialysis: Secondary | ICD-10-CM | POA: Diagnosis not present

## 2023-02-12 DIAGNOSIS — N186 End stage renal disease: Secondary | ICD-10-CM | POA: Diagnosis not present

## 2023-02-13 DIAGNOSIS — Z992 Dependence on renal dialysis: Secondary | ICD-10-CM | POA: Diagnosis not present

## 2023-02-13 DIAGNOSIS — N186 End stage renal disease: Secondary | ICD-10-CM | POA: Diagnosis not present

## 2023-02-14 DIAGNOSIS — Z992 Dependence on renal dialysis: Secondary | ICD-10-CM | POA: Diagnosis not present

## 2023-02-14 DIAGNOSIS — N186 End stage renal disease: Secondary | ICD-10-CM | POA: Diagnosis not present

## 2023-02-15 DIAGNOSIS — Z992 Dependence on renal dialysis: Secondary | ICD-10-CM | POA: Diagnosis not present

## 2023-02-15 DIAGNOSIS — N186 End stage renal disease: Secondary | ICD-10-CM | POA: Diagnosis not present

## 2023-02-16 ENCOUNTER — Ambulatory Visit (INDEPENDENT_AMBULATORY_CARE_PROVIDER_SITE_OTHER): Payer: Medicare PPO | Admitting: Urology

## 2023-02-16 VITALS — BP 104/67 | HR 94 | Ht 69.0 in | Wt 130.0 lb

## 2023-02-16 DIAGNOSIS — N2889 Other specified disorders of kidney and ureter: Secondary | ICD-10-CM

## 2023-02-16 DIAGNOSIS — R3129 Other microscopic hematuria: Secondary | ICD-10-CM

## 2023-02-16 DIAGNOSIS — N186 End stage renal disease: Secondary | ICD-10-CM | POA: Diagnosis not present

## 2023-02-16 DIAGNOSIS — D494 Neoplasm of unspecified behavior of bladder: Secondary | ICD-10-CM | POA: Diagnosis not present

## 2023-02-16 DIAGNOSIS — Z992 Dependence on renal dialysis: Secondary | ICD-10-CM | POA: Diagnosis not present

## 2023-02-16 LAB — MICROSCOPIC EXAMINATION: Epithelial Cells (non renal): >10 /[HPF] — AB (ref 0–10)

## 2023-02-16 LAB — URINALYSIS, COMPLETE
Bilirubin, UA: NEGATIVE
Glucose, UA: NEGATIVE
Ketones, UA: NEGATIVE
Leukocytes,UA: NEGATIVE
Nitrite, UA: NEGATIVE
Specific Gravity, UA: 1.02 (ref 1.005–1.030)
Urobilinogen, Ur: 0.2 mg/dL (ref 0.2–1.0)
pH, UA: 8 — ABNORMAL HIGH (ref 5.0–7.5)

## 2023-02-16 NOTE — Progress Notes (Signed)
   02/16/23  CC: No chief complaint on file.   HPI: 73 y.o. male with microhematuria.  CT abdomen/pelvis with contrast September 2024 showed a small right renal cyst but no significant upper tract abnormalities.  He was having some lower abdominal discomfort and a follow-up noncontrast CT was performed 12/15/2022 which showed no urinary tract calculi.  UA today with 3-10 RBC  Blood pressure 104/67, pulse 94, height 5\' 9"  (1.753 m), weight 130 lb (59 kg). NED. A&Ox3.   No respiratory distress   Abd soft, NT, ND Normal phallus with bilateral descended testicles  Cystoscopy Procedure Note  Patient identification was confirmed, informed consent was obtained, and patient was prepped using Betadine solution.  Lidocaine jelly was administered per urethral meatus.     Pre-Procedure: - Inspection reveals a normal caliber ureteral meatus.  Procedure: The flexible cystoscope was introduced without difficulty - No urethral strictures/lesions are present. 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 Zingg and recommend scheduling TURBT and post resection intravesical gemcitabine The procedure was discussed in detail including potential risks of bleeding, infection and bladder injury.  Possibility of urethral stricture was also discussed.  All questions were answered and he desires to proceed    Riki Altes, MD

## 2023-02-17 DIAGNOSIS — N186 End stage renal disease: Secondary | ICD-10-CM | POA: Diagnosis not present

## 2023-02-17 DIAGNOSIS — Z992 Dependence on renal dialysis: Secondary | ICD-10-CM | POA: Diagnosis not present

## 2023-02-18 ENCOUNTER — Encounter: Payer: Self-pay | Admitting: Urology

## 2023-02-18 DIAGNOSIS — N186 End stage renal disease: Secondary | ICD-10-CM | POA: Diagnosis not present

## 2023-02-18 DIAGNOSIS — Z992 Dependence on renal dialysis: Secondary | ICD-10-CM | POA: Diagnosis not present

## 2023-02-18 NOTE — Progress Notes (Addendum)
02/16/2023 5:49 PM   Casey French [birth year] [id number]  Referring provider: Jerl Mina, MD [address] [facility] [place],  [place] [address]   HPI: Casey French is a 73 y.o. male recently found to have microhematuria.  CT urogram was unremarkable however cystoscopy today showed papillary tumor on the posterior wall.  TURBT was recommended   PMH: Past Medical History:  Diagnosis Date   Acute diverticulitis 06/14/2014   Anemia    Anginal pain (HCC)    Aortic atherosclerosis (HCC)    Atrial fibrillation (HCC)    CKD (chronic kidney disease) stage 3, GFR 30-59 ml/min (HCC)    Coronary artery disease    Depression    Diabetes mellitus without complication (HCC)    Diverticulosis    GERD (gastroesophageal reflux disease)    GI bleed 05/15/2019   Gout    Heart murmur    Hematemesis 05/15/2019   High cholesterol    Hypertension    STEMI (ST elevation myocardial infarction) (HCC) 2003   Inferior    Surgical History: Past Surgical History:  Procedure Laterality Date   CAPD INSERTION N/A 08/13/2020   Procedure: LAPAROSCOPIC INSERTION CONTINUOUS AMBULATORY PERITONEAL DIALYSIS  (CAPD) CATHETER;  Surgeon: Campbell Lerner, MD;  Location: [facility] ORS;  Service: General;  Laterality: N/A;   CARDIAC CATHETERIZATION     CATARACT EXTRACTION W/PHACO Right 07/05/2019   Procedure: CATARACT EXTRACTION PHACO AND INTRAOCULAR LENS PLACEMENT (IOC) RIGHT DIABETIC;  Surgeon: Galen Manila, MD;  Location: [facility] ORS;  Service: Ophthalmology;  Laterality: Right;  US00:59.3 CDE8.41 LOT [id number] h   COLONOSCOPY WITH PROPOFOL N/A 04/01/2017   Procedure: COLONOSCOPY WITH PROPOFOL;  Surgeon: Christena Deem, MD;  Location: [facility] ENDOSCOPY;  Service: Endoscopy;  Laterality: N/A;   CORONARY ANGIOPLASTY WITH STENT PLACEMENT  10/2001   RCA   CORONARY ANGIOPLASTY WITH STENT PLACEMENT  07/2015   LAD   right hand surgery  2015   3 screws in right hand   TOTAL HIP ARTHROPLASTY  Left 04/22/2020   Procedure: TOTAL HIP ARTHROPLASTY ANTERIOR APPROACH;  Surgeon: Kennedy Bucker, MD;  Location: [facility] ORS;  Service: Orthopedics;  Laterality: Left;    Home Medications:  Allergies as of 02/16/2023       Reactions   Morphine Anxiety   agitation        Medication List        Accurate as of February 16, 2023 11:59 PM. If you have any questions, ask your nurse or doctor.          acetaminophen 325 MG tablet Commonly known as: TYLENOL Take 2 tablets (650 mg total) by mouth every 6 (six) hours as needed for mild pain (or Fever >/= 101).   allopurinol 100 MG tablet Commonly known as: ZYLOPRIM Take 100 mg by mouth daily.   amLODipine 10 MG tablet Commonly known as: NORVASC Take 1 tablet (10 mg total) by mouth daily.   apixaban 2.5 MG Tabs tablet Commonly known as: ELIQUIS Take 2.5 mg by mouth 2 (two) times daily.   aspirin EC 81 MG tablet Take 81 mg by mouth daily.   calcitRIOL 0.25 MCG capsule Commonly known as: ROCALTROL Take 1 capsule (0.25 mcg total) by mouth daily.   escitalopram 10 MG tablet Commonly known as: LEXAPRO Take 10 mg by mouth daily.   folic acid 1 MG tablet Commonly known as: FOLVITE Take 1 tablet (1 mg total) by mouth daily. Take 1 tablet daily   gabapentin 300 MG capsule Commonly known  as: NEURONTIN Take 300 mg by mouth at bedtime.   hydrocortisone 2.5 % rectal cream Commonly known as: ANUSOL-HC Place 1 application rectally 2 (two) times daily as needed for hemorrhoids or anal itching.   isosorbide mononitrate 60 MG 24 hr tablet Commonly known as: IMDUR Take 60 mg by mouth daily.   losartan 100 MG tablet Commonly known as: COZAAR Take 1 tablet (100 mg total) by mouth daily.   meclizine 25 MG tablet Commonly known as: ANTIVERT Take 1 tablet (25 mg total) by mouth 2 (two) times daily.   methocarbamol 500 MG tablet Commonly known as: ROBAXIN Take 500 mg by mouth 3 (three) times daily as needed.   metoprolol succinate  100 MG 24 hr tablet Commonly known as: TOPROL-XL Take 100 mg by mouth daily. Take with or immediately following a meal.   nitroGLYCERIN 0.4 MG SL tablet Commonly known as: NITROSTAT Place 0.4 mg under the tongue every 5 (five) minutes as needed for chest pain.   omeprazole 20 MG capsule Commonly known as: PRILOSEC Take 20 mg by mouth daily.   psyllium 58.6 % packet Commonly known as: METAMUCIL Take 1 packet by mouth 2 (two) times daily.   rosuvastatin 40 MG tablet Commonly known as: CRESTOR Take 40 mg by mouth daily.   Vitamin D (Ergocalciferol) 1.25 MG (50000 UNIT) Caps capsule Commonly known as: DRISDOL Take 50,000 Units by mouth once a week.        Allergies:  Allergies  Allergen Reactions   Morphine Anxiety    agitation     Family History: Family History  Problem Relation Age of Onset   Heart disease Mother    Heart disease Father    CAD Other     Social History:  reports that he quit smoking about 21 years ago. His smoking use included cigarettes. He has never used smokeless tobacco. He reports that he does not drink alcohol and does not use drugs.   Physical Exam: BP 104/67   Pulse 94   Ht 5\' 9"  (1.753 m)   Wt 130 lb (59 kg)   BMI 19.20 kg/m   Constitutional:  Alert and oriented, No acute distress. HEENT: [place] AT Cardiovascular: No clubbing, cyanosis, or edema. Respiratory: Normal respiratory effort, no increased work of breathing. Psychiatric: Normal mood and affect.    Assessment & Plan:    1.  Bladder tumor Scheduled for TURBT Will plan post resection intravesical gemcitabine The procedure was discussed in detail including potential risks of bleeding, infection and bladder injury. Possibility of urethral stricture was also discussed. All questions were answered and he desires to proceed    Riki Altes, MD  [facility] [address] [place], [place] [address] [phone]

## 2023-02-19 DIAGNOSIS — Z992 Dependence on renal dialysis: Secondary | ICD-10-CM | POA: Diagnosis not present

## 2023-02-19 DIAGNOSIS — N186 End stage renal disease: Secondary | ICD-10-CM | POA: Diagnosis not present

## 2023-02-20 DIAGNOSIS — Z992 Dependence on renal dialysis: Secondary | ICD-10-CM | POA: Diagnosis not present

## 2023-02-20 DIAGNOSIS — N186 End stage renal disease: Secondary | ICD-10-CM | POA: Diagnosis not present

## 2023-02-21 DIAGNOSIS — Z992 Dependence on renal dialysis: Secondary | ICD-10-CM | POA: Diagnosis not present

## 2023-02-21 DIAGNOSIS — N186 End stage renal disease: Secondary | ICD-10-CM | POA: Diagnosis not present

## 2023-02-22 DIAGNOSIS — N186 End stage renal disease: Secondary | ICD-10-CM | POA: Diagnosis not present

## 2023-02-22 DIAGNOSIS — Z992 Dependence on renal dialysis: Secondary | ICD-10-CM | POA: Diagnosis not present

## 2023-02-23 DIAGNOSIS — Z992 Dependence on renal dialysis: Secondary | ICD-10-CM | POA: Diagnosis not present

## 2023-02-23 DIAGNOSIS — N186 End stage renal disease: Secondary | ICD-10-CM | POA: Diagnosis not present

## 2023-02-24 DIAGNOSIS — Z992 Dependence on renal dialysis: Secondary | ICD-10-CM | POA: Diagnosis not present

## 2023-02-24 DIAGNOSIS — N186 End stage renal disease: Secondary | ICD-10-CM | POA: Diagnosis not present

## 2023-02-25 ENCOUNTER — Other Ambulatory Visit: Payer: Self-pay | Admitting: Urology

## 2023-02-25 ENCOUNTER — Other Ambulatory Visit: Payer: Self-pay

## 2023-02-25 DIAGNOSIS — D494 Neoplasm of unspecified behavior of bladder: Secondary | ICD-10-CM

## 2023-02-25 DIAGNOSIS — Z992 Dependence on renal dialysis: Secondary | ICD-10-CM | POA: Diagnosis not present

## 2023-02-25 DIAGNOSIS — N186 End stage renal disease: Secondary | ICD-10-CM | POA: Diagnosis not present

## 2023-02-25 NOTE — Progress Notes (Unsigned)
Surgical Physician Order Form [facility]  * Scheduling expectation : Next Available  *Length of Case: 30 minutes  *Clearance needed: yes-cardiology  *Anticoagulation Instructions: Hold all anticoagulants-on Eliquis  *Aspirin Instructions: Ok to continue Aspirin  *Post-op visit Date/Instructions:  1-2 week with pathology review  *Diagnosis: Bladder Tumor  *Procedure:  TURBT 2-5cm (52841)   Additional orders: Gemcitabine 2000mg  bladder instillation  -Admit type: OUTpatient  -Anesthesia: Choice  -VTE Prophylaxis Standing Order SCD's       Other:   -Standing Lab Orders Per Anesthesia    Lab other: UA&Urine Culture  -Standing Test orders EKG/Chest x-ray per Anesthesia       Test other:   - Medications:  Ancef 2gm IV  -Other orders:  N/A

## 2023-02-26 DIAGNOSIS — Z992 Dependence on renal dialysis: Secondary | ICD-10-CM | POA: Diagnosis not present

## 2023-02-26 DIAGNOSIS — N186 End stage renal disease: Secondary | ICD-10-CM | POA: Diagnosis not present

## 2023-02-27 DIAGNOSIS — N186 End stage renal disease: Secondary | ICD-10-CM | POA: Diagnosis not present

## 2023-02-27 DIAGNOSIS — Z992 Dependence on renal dialysis: Secondary | ICD-10-CM | POA: Diagnosis not present

## 2023-02-28 DIAGNOSIS — N186 End stage renal disease: Secondary | ICD-10-CM | POA: Diagnosis not present

## 2023-02-28 DIAGNOSIS — Z992 Dependence on renal dialysis: Secondary | ICD-10-CM | POA: Diagnosis not present

## 2023-03-01 DIAGNOSIS — Z992 Dependence on renal dialysis: Secondary | ICD-10-CM | POA: Diagnosis not present

## 2023-03-01 DIAGNOSIS — N186 End stage renal disease: Secondary | ICD-10-CM | POA: Diagnosis not present

## 2023-03-02 DIAGNOSIS — Z992 Dependence on renal dialysis: Secondary | ICD-10-CM | POA: Diagnosis not present

## 2023-03-02 DIAGNOSIS — N186 End stage renal disease: Secondary | ICD-10-CM | POA: Diagnosis not present

## 2023-03-03 DIAGNOSIS — Z992 Dependence on renal dialysis: Secondary | ICD-10-CM | POA: Diagnosis not present

## 2023-03-03 DIAGNOSIS — N186 End stage renal disease: Secondary | ICD-10-CM | POA: Diagnosis not present

## 2023-03-04 ENCOUNTER — Telehealth: Payer: Self-pay | Admitting: Urology

## 2023-03-04 DIAGNOSIS — N186 End stage renal disease: Secondary | ICD-10-CM | POA: Diagnosis not present

## 2023-03-04 DIAGNOSIS — Z992 Dependence on renal dialysis: Secondary | ICD-10-CM | POA: Diagnosis not present

## 2023-03-04 NOTE — Telephone Encounter (Signed)
 Pt's wife called and stated she was returning a call. Wife stated she has questions regarding pt's surgery. Please advise patient.

## 2023-03-05 DIAGNOSIS — Z992 Dependence on renal dialysis: Secondary | ICD-10-CM | POA: Diagnosis not present

## 2023-03-05 DIAGNOSIS — N186 End stage renal disease: Secondary | ICD-10-CM | POA: Diagnosis not present

## 2023-03-06 DIAGNOSIS — Z992 Dependence on renal dialysis: Secondary | ICD-10-CM | POA: Diagnosis not present

## 2023-03-06 DIAGNOSIS — N186 End stage renal disease: Secondary | ICD-10-CM | POA: Diagnosis not present

## 2023-03-07 ENCOUNTER — Telehealth: Payer: Self-pay

## 2023-03-07 DIAGNOSIS — Z992 Dependence on renal dialysis: Secondary | ICD-10-CM | POA: Diagnosis not present

## 2023-03-07 DIAGNOSIS — N186 End stage renal disease: Secondary | ICD-10-CM | POA: Diagnosis not present

## 2023-03-07 NOTE — Telephone Encounter (Signed)
  Per Dr. Cherylene Corrente, Patient is to be scheduled for Transurethral Resection of Bladder Tumor with Intravesical Instillation of Gemcitabine    Mr. Merchant was contacted and possible surgical dates were discussed, Tuesday February 18th, 2025 was agreed upon for surgery. Patient was instructed that Dr. Cherylene Corrente will require them to provide a pre-op UA & CX prior to surgery. This was ordered and scheduled drop off appointment was made for 03/08/2023.    Patient was directed to call [phone] between 1-3pm the day before surgery to find out surgical arrival time.  Instructions were given not to eat or drink from midnight on the night before surgery and have a driver for the day of surgery. On the surgery day patient was instructed to enter through the Medical Mall entrance of [facility] report the Same Day Surgery desk.   Pre-Admit Testing will be in contact via phone to set up an interview with the anesthesia team to review your history and medications prior to surgery.   Reminder of this information was sent via MyChart to the patient.   Patient is to hold anticoag's per Dr. Cherylene Corrente.  Currently taking Eliquis  by Dr. Parks Bollman. Clearance requested to stop 2days prior to surgery, clearance form was faxed to [facility]. Awaiting response

## 2023-03-07 NOTE — Telephone Encounter (Signed)
 Spoke with patient and wife, surgery scheduled.

## 2023-03-08 ENCOUNTER — Encounter: Payer: Self-pay | Admitting: Oncology

## 2023-03-08 ENCOUNTER — Telehealth: Payer: Self-pay

## 2023-03-08 ENCOUNTER — Other Ambulatory Visit: Payer: Medicare HMO

## 2023-03-08 DIAGNOSIS — Z992 Dependence on renal dialysis: Secondary | ICD-10-CM | POA: Diagnosis not present

## 2023-03-08 DIAGNOSIS — D494 Neoplasm of unspecified behavior of bladder: Secondary | ICD-10-CM | POA: Diagnosis not present

## 2023-03-08 DIAGNOSIS — N186 End stage renal disease: Secondary | ICD-10-CM | POA: Diagnosis not present

## 2023-03-08 NOTE — Telephone Encounter (Signed)
Received clearance from [facility]. Patient advised to hold Eliquis for 2 days prior to surgery. Patient verbalized understanding.

## 2023-03-09 ENCOUNTER — Other Ambulatory Visit: Payer: Self-pay

## 2023-03-09 ENCOUNTER — Encounter
Admission: RE | Admit: 2023-03-09 | Discharge: 2023-03-09 | Disposition: A | Discharge status: Home / Self Care | Payer: Medicare HMO | Source: Ambulatory Visit | Attending: Urology | Admitting: Urology

## 2023-03-09 VITALS — Ht 69.0 in | Wt 130.0 lb

## 2023-03-09 DIAGNOSIS — Z01812 Encounter for preprocedural laboratory examination: Secondary | ICD-10-CM

## 2023-03-09 DIAGNOSIS — I2510 Atherosclerotic heart disease of native coronary artery without angina pectoris: Secondary | ICD-10-CM

## 2023-03-09 DIAGNOSIS — Z01810 Encounter for preprocedural cardiovascular examination: Secondary | ICD-10-CM

## 2023-03-09 DIAGNOSIS — I10 Essential (primary) hypertension: Secondary | ICD-10-CM

## 2023-03-09 DIAGNOSIS — D631 Anemia in chronic kidney disease: Secondary | ICD-10-CM

## 2023-03-09 DIAGNOSIS — N186 End stage renal disease: Secondary | ICD-10-CM

## 2023-03-09 HISTORY — DX: Hyperlipidemia, unspecified: E78.5

## 2023-03-09 HISTORY — DX: Paroxysmal atrial fibrillation: I48.0

## 2023-03-09 HISTORY — DX: Atherosclerotic heart disease of native coronary artery without angina pectoris: I25.10

## 2023-03-09 HISTORY — DX: Long term (current) use of anticoagulants: Z79.01

## 2023-03-09 HISTORY — DX: End stage renal disease: N18.6

## 2023-03-09 HISTORY — DX: End stage renal disease: Z99.2

## 2023-03-09 LAB — URINALYSIS, COMPLETE
Bilirubin, UA: NEGATIVE
Ketones, UA: NEGATIVE
Leukocytes,UA: NEGATIVE
Nitrite, UA: NEGATIVE
Specific Gravity, UA: 1.025 (ref 1.005–1.030)
Urobilinogen, Ur: 0.2 mg/dL (ref 0.2–1.0)
pH, UA: 7.5 (ref 5.0–7.5)

## 2023-03-09 LAB — MICROSCOPIC EXAMINATION

## 2023-03-09 NOTE — Patient Instructions (Addendum)
Your procedure is scheduled on: Tuesday, February 18 Report to the Registration Desk on the 1st floor of the [facility] To find out your arrival time, please call [phone] between 1PM - 3PM on: Monday, February 17 If your arrival time is 6:00 am, do not arrive before that time as the Medical Mall entrance doors do not open until 6:00 am.  REMEMBER: Instructions that are not followed completely may result in serious medical risk, up to and including death; or upon the discretion of your surgeon and anesthesiologist your surgery may need to be rescheduled.  Do not eat or drink after midnight the night before surgery.  No gum chewing or hard candies.  One week prior to surgery: Stop Anti-inflammatories (NSAIDS) such as Advil, Aleve, Ibuprofen, Motrin, Naproxen, Naprosyn and Aspirin based products such as Excedrin, Goody's Powder, BC Powder. Stop ANY OVER THE COUNTER supplements until after surgery.  You may however, continue to take Tylenol if needed for pain up until the day of surgery.  Per Dr. Apolinar Junes; you may continue taking the 81 mg aspirin.  apixaban (ELIQUIS) - hold for 2 days before surgery. Last day to take is Saturday, February 15. Resume AFTER surgery per surgeon instructions.  Continue taking all of your other prescription medications up until the day of surgery.  ON THE DAY OF SURGERY ONLY TAKE THESE MEDICATIONS WITH SIPS OF WATER:  amLODipine (NORVASC)  escitalopram (LEXAPRO)  isosorbide mononitrate  metoprolol succinate  omeprazole (PRILOSEC)  rosuvastatin (CRESTOR)   No Alcohol for 24 hours before or after surgery.  No Smoking including e-cigarettes for 24 hours before surgery.  No chewable tobacco products for at least 6 hours before surgery.  No nicotine patches on the day of surgery.  Do not use any "recreational" drugs for at least a week (preferably 2 weeks) before your surgery.  Please be advised that the combination of cocaine and anesthesia may  have negative outcomes, up to and including death. If you test positive for cocaine, your surgery will be cancelled.  On the morning of surgery brush your teeth with toothpaste and water, you may rinse your mouth with mouthwash if you wish. Do not swallow any toothpaste or mouthwash.  Do not wear jewelry, make-up, hairpins, clips or nail polish.  For welded (permanent) jewelry: bracelets, anklets, waist bands, etc.  Please have this removed prior to surgery.  If it is not removed, there is a chance that hospital personnel will need to cut it off on the day of surgery.  Do not wear lotions, powders, or perfumes.   Do not shave body hair from the neck down 48 hours before surgery.  Contact lenses, hearing aids and dentures may not be worn into surgery.  Do not bring valuables to the hospital. [facility] is not responsible for any missing/lost belongings or valuables.   Notify your doctor if there is any change in your medical condition (cold, fever, infection).  Wear comfortable clothing (specific to your surgery type) to the hospital.  After surgery, you can help prevent lung complications by doing breathing exercises.  Take deep breaths and cough every 1-2 hours.   If you are being discharged the day of surgery, you will not be allowed to drive home. You will need a responsible individual to drive you home and stay with you for 24 hours after surgery.   If you are taking public transportation, you will need to have a responsible individual with you.  Please call the Pre-admissions Testing Dept.  at [phone] if you have any questions about these instructions.  Surgery Visitation Policy:  Patients having surgery or a procedure may have two visitors.  Children under the age of 39 must have an adult with them who is not the patient.  Temporary Visitor Restrictions Due to increasing cases of flu, RSV and COVID-19: Children ages 61 and under will not be able to visit patients  in [facility] hospitals under most circumstances.

## 2023-03-10 ENCOUNTER — Encounter: Payer: Self-pay | Admitting: Urgent Care

## 2023-03-10 ENCOUNTER — Encounter
Admission: RE | Admit: 2023-03-10 | Discharge: 2023-03-10 | Disposition: A | Discharge status: Home / Self Care | Payer: Medicare HMO | Source: Ambulatory Visit | Attending: Urology | Admitting: Urology

## 2023-03-10 DIAGNOSIS — N186 End stage renal disease: Secondary | ICD-10-CM | POA: Diagnosis not present

## 2023-03-10 DIAGNOSIS — D631 Anemia in chronic kidney disease: Secondary | ICD-10-CM | POA: Insufficient documentation

## 2023-03-10 DIAGNOSIS — Z9861 Coronary angioplasty status: Secondary | ICD-10-CM | POA: Insufficient documentation

## 2023-03-10 DIAGNOSIS — Z01810 Encounter for preprocedural cardiovascular examination: Secondary | ICD-10-CM

## 2023-03-10 DIAGNOSIS — Z01812 Encounter for preprocedural laboratory examination: Secondary | ICD-10-CM | POA: Insufficient documentation

## 2023-03-10 DIAGNOSIS — I120 Hypertensive chronic kidney disease with stage 5 chronic kidney disease or end stage renal disease: Secondary | ICD-10-CM | POA: Diagnosis not present

## 2023-03-10 DIAGNOSIS — I2510 Atherosclerotic heart disease of native coronary artery without angina pectoris: Secondary | ICD-10-CM | POA: Diagnosis not present

## 2023-03-10 DIAGNOSIS — Z992 Dependence on renal dialysis: Secondary | ICD-10-CM | POA: Insufficient documentation

## 2023-03-10 DIAGNOSIS — I10 Essential (primary) hypertension: Secondary | ICD-10-CM

## 2023-03-10 LAB — CBC
HCT: 27.6 % — ABNORMAL LOW (ref 39.0–52.0)
Hemoglobin: 9.2 g/dL — ABNORMAL LOW (ref 13.0–17.0)
MCH: 32.4 pg (ref 26.0–34.0)
MCHC: 33.3 g/dL (ref 30.0–36.0)
MCV: 97.2 fL (ref 80.0–100.0)
Platelets: 212 10*3/uL (ref 150–400)
RBC: 2.84 MIL/uL — ABNORMAL LOW (ref 4.22–5.81)
RDW: 13.6 % (ref 11.5–15.5)
WBC: 9.2 10*3/uL (ref 4.0–10.5)
nRBC: 0 % (ref 0.0–0.2)

## 2023-03-10 LAB — BASIC METABOLIC PANEL
Anion gap: 12 (ref 5–15)
BUN: 56 mg/dL — ABNORMAL HIGH (ref 8–23)
CO2: 18 mmol/L — ABNORMAL LOW (ref 22–32)
Calcium: 8.2 mg/dL — ABNORMAL LOW (ref 8.9–10.3)
Chloride: 110 mmol/L (ref 98–111)
Creatinine, Ser: 7.5 mg/dL — ABNORMAL HIGH (ref 0.61–1.24)
GFR, Estimated: 7 mL/min — ABNORMAL LOW (ref >60)
Glucose, Bld: 85 mg/dL (ref 70–99)
Potassium: 4.3 mmol/L (ref 3.5–5.1)
Sodium: 140 mmol/L (ref 135–145)

## 2023-03-11 ENCOUNTER — Encounter: Payer: Self-pay | Admitting: Urology

## 2023-03-11 DIAGNOSIS — N186 End stage renal disease: Secondary | ICD-10-CM | POA: Diagnosis not present

## 2023-03-11 DIAGNOSIS — Z992 Dependence on renal dialysis: Secondary | ICD-10-CM | POA: Diagnosis not present

## 2023-03-11 LAB — CULTURE, URINE COMPREHENSIVE

## 2023-03-11 NOTE — Progress Notes (Addendum)
Perioperative / Anesthesia Services  Pre-Admission Testing Clinical Review / Pre-Operative Anesthesia Consult  Date: 03/11/23  Patient Demographics:  Name: Casey French DOB: [birth year] MRN:   [id number]  Planned Surgical Procedure(s):    Case: [id number] Date/Time: 03/15/23 0816   Procedures:      TRANSURETHRAL RESECTION OF BLADDER TUMOR (TURBT)     BLADDER INSTILLATION OF GEMCITABINE   Anesthesia type: General   Pre-op diagnosis: Bladder Tumor   Location: [facility] OR ROOM 10 / [facility]S FOR ANEST[facility]   Surgeons: Riki Altes, MD      NOTE: Available PAT nursing documentation and vital signs have been reviewed. Clinical nursing staff has updated patient's PMH/PSHx, current medication list, and drug allergies/intolerances to ensure comprehensive history available to assist in medical decision making as it pertains to the aforementioned surgical procedure and anticipated anesthetic course. Extensive review of available clinical information personally performed. Casey French PMH and PSHx updated with any diagnoses/procedures that  may have been inadvertently omitted during his intake with the pre-admission testing department's nursing staff.  Clinical Discussion:  Casey French is a 72 y.o. male who is submitted for pre-surgical anesthesia review and clearance prior to him undergoing the above procedure. Patient is a Former Smoker (quit 02/2001). Pertinent PMH includes:  CAD, inferior STEMI (2003), myocardial bridge, PAF, multiple lacunar infarctions/CVA, chronic cerebral microvascular disease, cardiac murmur, unstable angina, aortic atherosclerosis, traumatic skull fracture with resulting SAH s/p fall on ice, HTN, HLD, T2DM, ESRD on PD, GERD (on daily PPI), anemia of chronic renal disease, bladder tumor, deep depression.   Patient is followed by cardiology Darrold Junker, MD). He was last seen in the cardiology clinic on 10/06/2022; notes reviewed. At the time of his clinic  visit, patient doing well overall from a cardiovascular perspective. Patient with intermittent episodes of palpitations that were self limiting. Patient denied any chest pain, shortness of breath, PND, orthopnea, significant peripheral edema, weakness, fatigue, vertiginous symptoms, or presyncope/syncope. Patient with a past medical history significant for cardiovascular diagnoses. Documented physical exam was grossly benign, providing no evidence of acute exacerbation and/or decompensation of the patient's known cardiovascular conditions.  Of note, complete records regarding patient's cardiovascular history unavailable for review at time of consult.  Information gathered from patient report and from extensive review of notes provided by his local care providers.  Patient underwent CT imaging of the head on 02/11/2007 revealing lacunar infarction involving the thalamus and external capsule region on the RIGHT.  Subsequent MRI imaging of the brain performed on 02/03/2020 revealed multiple remote lacunar infarcts involving the RIGHT basal ganglia, RIGHT thalamus, and LEFT middle cerebral peduncle.  Following remote neurological events, patient denies significant neurological deficits.  Patient suffered an inferior wall STEMI in 2003.  Patient underwent PCI and placement of a Cypher stent to the RCA.  Repeat diagnostic LEFT heart catheterization was performed in 08/2003 revealing an occluded distal RCA with residual 60% stenosis of the proximal LAD.  Interventional cardiology made the decision to defer intervention opting for medical management.  Diagnostic LEFT heart catheterization was performed on 08/21/2015 in the setting of unstable angina.  Procedure was done at [facility].  Catheterization revealed occlusion of the RCA at the site of the previously placed stent.  Additionally, there was 70% stenosis of the mid LAD and 50% of the distal LAD.  FFR analysis: 0.70 distal LAD and 0.79 mid LAD (ranges: < 0.75 high  likelihood of hemodynamically significant stenosis, 0.76-0.80 borderline, > 0.80 normal). Myocardial bridging in the distal LAD  was noted in a segment of the stenotic artery.  PCI was subsequently performed placing a 3.5 x 12 mm Xience DES x 1 to the mid LAD.  Procedure yielded excellent angiographic result and TIMI-3 flow.  Myocardial perfusion imaging study was performed on 11/11/2016 revealing normal left regular systolic function with an EF of 55 to 65%.  There were no regional wall motion abnormalities.  There was no evidence of stress-induced myocardial ischemia or arrhythmia; no scintigraphic evidence of scar.  Study was determined to be normal and low risk.  Most recent TTE was performed on 10/09/2022 revealing a normal left ventricular systolic function with an EF of 60 to 65%.  There were no regional wall motion abnormalities. Left ventricular diastolic Doppler parameters consistent with abnormal relaxation (G1DD).  Right ventricular size and function normal.  There was trivial mitral valve regurgitation. All transvalvular gradients were noted to be normal providing no evidence suggestive of valvular stenosis. Aorta normal in size with no evidence of ectasia or aneurysmal dilatation.  Patient with an atrial fibrillation diagnosis; CHA2DS2-VASc Score = 6 (age, HTN, CVA x 2, prior MI/vascular disease, T2DM). His rate and rhythm are currently being maintained on oral metoprolol succinate. He is chronically anticoagulated using dose reduced apixaban; reported to be compliant with therapy with no evidence or reports of GI/GU bleeding.  Blood pressure mildly elevated at 140/80 mmHg on currently prescribed CCB (amlodipine), nitrate (isosorbide mononitrate), ARB (losartan) and beta-blocker (metoprolol succinate) therapies.  In addition to his scheduled nitrates, patient has a supply of short acting nitrates to use on a as needed basis for angina/anginal equivalent symptoms uncontrolled by his Imdur; denied  recent use.  He is on rosuvastatin for his HLD diagnosis and further ASCVD prevention. T2DM well controlled on currently prescribed regimen; last HgbA1c was 5.4% when checked on 04/18/2022. Patient is able to complete all of his  ADL/IADLs without cardiovascular limitation.  Per the [facility]AS[facility], patient is able to achieve at least 4 METS of physical activity without experiencing any significant degree of angina/anginal equivalent symptoms.No changes were made to his medication regimen during his visit with cardiology.  Patient scheduled to follow-up with outpatient cardiology in 4 months or sooner if needed.  Casey French is scheduled for an elective TRANSURETHRAL RESECTION OF BLADDER TUMOR (TURBT); BLADDER INSTILLATION OF GEMCITABINE on 03/15/2023 with Dr. Irineo Axon, MD.  Given patient's past medical history significant for cardiovascular diagnoses, presurgical cardiac clearance was sought by the PAT team. Per cardiology, "this patient is optimized for surgery and may proceed with the planned procedural course with a MODERATE risk of significant perioperative cardiovascular complications".   Again, this patient is on daily oral anticoagulation therapy using a DOAC medication.  He has been instructed on recommendations for holding his apixaban for 2 days prior to his procedure with plans to restart as soon as postoperative bleeding risk felt to be minimized by his attending surgeon. The patient has been instructed that his last dose of apixaban should be on 03/12/2023.  Patient denies previous perioperative complications with anesthesia in the past. In review his EMR, it is noted that patient underwent a general anesthetic course here at [facility] ([facility]) in 07/2020 without documented complications.      03/09/2023    1:52 PM 02/16/2023    2:30 PM 11/17/2022   11:16 AM  Vitals with BMI  Height 5\' 9"  5\' 9"  5\' 9"   Weight 130 lbs 130 lbs 119 lbs  BMI 19.19 19.19  17.57  Systolic  104 141  Diastolic  67 83  Pulse  94 68   Providers/Specialists:  NOTE: Primary physician provider listed below. Patient may have been seen by APP or partner within same practice.   PROVIDER ROLE / SPECIALTY LAST Winfield Cunas, MD Urology (Surgeon) 02/16/2023  Jerl Mina, MD Primary Care Provider 11/24/2022  Marcina Millard, MD Cardiology 10/06/2022   Allergies:   Allergies  Allergen Reactions   Morphine Anxiety    agitation    Current Home Medications:   No current facility-administered medications for this encounter.    acetaminophen (TYLENOL) 325 MG tablet   allopurinol (ZYLOPRIM) 100 MG tablet   amLODipine (NORVASC) 10 MG tablet   apixaban (ELIQUIS) 2.5 MG TABS tablet   aspirin 81 MG EC tablet   calcitRIOL (ROCALTROL) 0.25 MCG capsule   escitalopram (LEXAPRO) 10 MG tablet   folic acid (FOLVITE) 1 MG tablet   gabapentin (NEURONTIN) 300 MG capsule   hydrocortisone (ANUSOL-HC) 2.5 % rectal cream   isosorbide mononitrate (IMDUR) 60 MG 24 hr tablet   losartan (COZAAR) 100 MG tablet   meclizine (ANTIVERT) 25 MG tablet   methocarbamol (ROBAXIN) 500 MG tablet   metoprolol succinate (TOPROL-XL) 100 MG 24 hr tablet   nitroGLYCERIN (NITROSTAT) 0.4 MG SL tablet   omeprazole (PRILOSEC) 20 MG capsule   psyllium (METAMUCIL) 58.6 % packet   rosuvastatin (CRESTOR) 40 MG tablet   Vitamin D, Ergocalciferol, (DRISDOL) 1.25 MG (50000 UNIT) CAPS capsule   History:   Past Medical History:  Diagnosis Date   Acute diverticulitis 06/14/2014   Acute ST elevation myocardial infarction (STEMI) of inferior wall (HCC) 2003   a.) PCI with Cypher stent to the RCA   Anemia of chronic renal failure    Aortic atherosclerosis (HCC)    Bladder tumor    CAP (community acquired pneumonia) 06/2022   Cerebral microvascular disease    Coronary artery disease involving native coronary artery of native heart without angina pectoris 2003   a.) PCI with Cypher stent  to the RCA 2003; b.) LHC 08/2003: 100% dRCA, 60% pLAD - med mgmt; c.) LHC/PCI 08/21/2015: occluded RCA at site of prior stent placement, 70% mLAD (FFR 0.79; 3.5 x 12 mm Xience DES), 50% dLAD (FFR 0.70); d.) MV 11/11/2016: no ischemia   Depression    Diverticulosis    End-stage renal disease on peritoneal dialysis (HCC)    GERD (gastroesophageal reflux disease)    GI bleed 05/15/2019   Gout    Heart murmur    Hematemesis 05/15/2019   Hepatic cyst    Hepatomegaly    Hyperlipidemia    Hypertension    Lacunar infarction (HCC) 02/11/2007   a.) CT head 02/11/2007: lacunar infarction involving thalamus and external capsule region on the RIGHT; b.) MRI brain 02/03/2020: multiple remote lacunar infarcts involving the RIGHT basal ganglia, RIGHT thalamus, and LEFT middle cerebellar peduncle   Long-term use of aspirin therapy    Myocardial bridge    On apixaban therapy    Pancolitis (HCC) 09/2022   Paroxysmal atrial fibrillation (HCC)    a.) CHA2DS2VASc = 6 (age, HTN, CVA x 2, prior MI/vascular disease, T2DM) as of 03/11/2023; b.) rate/rhythm maintained on oral metoprolol succinate; chronically anticoagulated with dose reduced apixaban   Renal cyst, right    Sigmoid diverticulitis 03/2022   T2DM (type 2 diabetes mellitus) (HCC)    Traumatic skull fracture and resulting subarachnoid hemorrhage 03/24/2013   a.) MOI was slipping and falling on ice  Unstable angina [facility])    Past Surgical History:  Procedure Laterality Date   CAPD INSERTION N/A 08/13/2020   Procedure: LAPAROSCOPIC INSERTION CONTINUOUS AMBULATORY PERITONEAL DIALYSIS  (CAPD) CATHETER;  Surgeon: Campbell Lerner, MD;  Location: [facility] ORS;  Service: General;  Laterality: N/A;   CARDIAC CATHETERIZATION     CATARACT EXTRACTION W/PHACO Right 07/05/2019   Procedure: CATARACT EXTRACTION PHACO AND INTRAOCULAR LENS PLACEMENT (IOC) RIGHT DIABETIC;  Surgeon: Galen Manila, MD;  Location: [facility] ORS;  Service: Ophthalmology;  Laterality: Right;   US00:59.3 CDE8.41 LOT [id number] h   COLONOSCOPY WITH PROPOFOL N/A 04/01/2017   Procedure: COLONOSCOPY WITH PROPOFOL;  Surgeon: Christena Deem, MD;  Location: [facility] ENDOSCOPY;  Service: Endoscopy;  Laterality: N/A;   CORONARY ANGIOPLASTY WITH STENT PLACEMENT  10/2001   RCA   CORONARY ANGIOPLASTY WITH STENT PLACEMENT  07/2015   LAD   HAND SURGERY  2015   3 screws in right hand   TOTAL HIP ARTHROPLASTY Left 04/22/2020   Procedure: TOTAL HIP ARTHROPLASTY ANTERIOR APPROACH;  Surgeon: Kennedy Bucker, MD;  Location: [facility] ORS;  Service: Orthopedics;  Laterality: Left;   Family History  Problem Relation Age of Onset   Heart disease Mother    Heart disease Father    CAD Other    Social History   Tobacco Use   Smoking status: Former    Current packs/day: 0.00    Types: Cigarettes    Quit date: 03/24/2001    Years since quitting: 21.9   Smokeless tobacco: Never  Substance Use Topics   Alcohol use: No    Alcohol/week: 0.0 standard drinks of alcohol   Pertinent Clinical Results:  LABS:  Hospital Outpatient Visit on 03/10/2023  Component Date Value Ref Range Status   Sodium 03/10/2023 140  135 - 145 mmol/L Final   Potassium 03/10/2023 4.3  3.5 - 5.1 mmol/L Final   Chloride 03/10/2023 110  98 - 111 mmol/L Final   CO2 03/10/2023 18 (L)  22 - 32 mmol/L Final   Glucose, Bld 03/10/2023 85  70 - 99 mg/dL Final   Glucose reference range applies only to samples taken after fasting for at least 8 hours.   BUN 03/10/2023 56 (H)  8 - 23 mg/dL Final   Creatinine, Ser 03/10/2023 7.50 (H)  0.61 - 1.24 mg/dL Final   Calcium 45/40/9811 8.2 (L)  8.9 - 10.3 mg/dL Final   GFR, Estimated 03/10/2023 7 (L)  >60 mL/min Final   Comment: (NOTE) Calculated using the CKD-EPI Creatinine Equation (2021)    Anion gap 03/10/2023 12  5 - 15 Final   Performed at [facility], [address] [place], [place] [address]   WBC 03/10/2023 9.2  4.0 - 10.5 K/uL Final   RBC 03/10/2023 2.84 (L)  4.22 - 5.81  MIL/uL Final   Hemoglobin 03/10/2023 9.2 (L)  13.0 - 17.0 g/dL Final   HCT 29/56/2130 27.6 (L)  39.0 - 52.0 % Final   MCV 03/10/2023 97.2  80.0 - 100.0 fL Final   MCH 03/10/2023 32.4  26.0 - 34.0 pg Final   MCHC 03/10/2023 33.3  30.0 - 36.0 g/dL Final   RDW 86/57/8469 13.6  11.5 - 15.5 % Final   Platelets 03/10/2023 212  150 - 400 K/uL Final   nRBC 03/10/2023 0.0  0.0 - 0.2 % Final   Performed at [facility], [address] [place], [place] [address]  Lab on 03/08/2023  Component Date Value Ref Range Status   Urine Culture, Comprehensive 03/08/2023  Final report   Final   Organism ID, Bacteria 03/08/2023 Comment   Final   No growth in 36 - 48 hours.   Specific Gravity, UA 03/08/2023 1.025  1.005 - 1.030 Final   pH, UA 03/08/2023 7.5  5.0 - 7.5 Final   Color, UA 03/08/2023 Yellow  Yellow Final   Appearance Ur 03/08/2023 Clear  Clear Final   Leukocytes,UA 03/08/2023 Negative  Negative Final   Protein,UA 03/08/2023 3+ (A)  Negative/Trace Final   Glucose, UA 03/08/2023 1+ (A)  Negative Final   Ketones, UA 03/08/2023 Negative  Negative Final   RBC, UA 03/08/2023 1+ (A)  Negative Final   Bilirubin, UA 03/08/2023 Negative  Negative Final   Urobilinogen, Ur 03/08/2023 0.2  0.2 - 1.0 mg/dL Final   Nitrite, UA 40/98/1191 Negative  Negative Final   Microscopic Examination 03/08/2023 See below:   Final   WBC, UA 03/08/2023 0-5  0 - 5 /hpf Final   RBC, Urine 03/08/2023 11-30 (A)  0 - 2 /hpf Final   Epithelial Cells (non renal) 03/08/2023 0-10  0 - 10 /hpf Final   Casts 03/08/2023 Present (A)  None seen /lpf Final   Cast Type 03/08/2023 Hyaline casts  N/A Final   Mucus, UA 03/08/2023 Present (A)  Not Estab. Final   Bacteria, UA 03/08/2023 Few  None seen/Few Final    ECG: Date: 10/07/2022  Time ECG obtained: 2026 PM Rate: 94 bpm Rhythm:  sinus rhythm Axis (leads I and aVF): borderline right Intervals: PR 137 ms. QRS 96 ms. QTc 484 ms. ST segment and T wave changes: Anterior ST  segment elevation likely related to LVH Evidence of a possible, age undetermined, prior infarct:  No Comparison: Similar to previous tracing obtained on 06/26/2022. Anterior ST elevation now present.    IMAGING / PROCEDURES: CT ABDOMEN PELVIS WO CONTRAST performed on 12/15/2022 Probable tiny cyst in the dome of the liver, no specific follow-up necessary.  Liver is enlarged, measuring approximately 19.5 cm.  Right renal cyst.  No follow-up necessary. Aortic atherosclerosis  Coronary artery calcification  MYOCARDIAL PERFUSION IMAGING STUDY (LEXISCAN) performed on 03/06/2020 LVEF 57% Regional wall motion reveals normal myocardial thickening and wall motion  There were no artifacts noted  Left ventricular cavity size normal Small perfusion abnormality of mild intensity present in the inferior region on stress imaging The overall quality of study is good   TRANSTHORACIC ECHOCARDIOGRAM performed on 02/04/2020 Left ventricular ejection fraction, by estimation, is 60 to 65%.  The left ventricle has normal function.  The left ventricle has no regional wall motion abnormalities.  Left ventricular diastolic parameters were normal.  Right ventricular systolic function is normal.  The right ventricular size is normal. The mitral valve is grossly normal. Trivial mitral valve regurgitation.  The aortic valve is normal in structure. Aortic valve regurgitation is not visualized.   MR BRAIN WO CONTRAST performed on 02/03/2020 Extensive patchy T2/FLAIR signal abnormality involving the cortical and subcortical aspects of both frontal, parietal, and occipital lobes as well as the medial thalami and cerebellum. Findings are nonspecific, but favored to reflect changes of PRES. Underlying age-related cerebral atrophy with chronic microvascular ischemic disease, with multiple remote lacunar infarcts involving the right basal ganglia, right thalamus, and left middle cerebellar peduncle.  LEFT HEART  CATHETERIZATION AND CORONARY ANGIOGRAPHY performed on 08/21/2015 RCA is occluded at the site of the prior stent 70% mid LAD stenosis and 50% distal LAD stenosis.   FFR in distal LAD was 0.70 and in the  mid LAD was 0.79 Myocardial bridging and distal LAD in the segment of the artery within the distal LAD stenosis Normal LVEDP of 9 mmHg Successful PCI of the mid LAD with placement of an Xience drug-eluting stent  Impression and Plan:  Casey French has been referred for pre-anesthesia review and clearance prior to him undergoing the planned anesthetic and procedural courses. Available labs, pertinent testing, and imaging results were personally reviewed by me in preparation for upcoming operative/procedural course. [facility] medical record has been updated following extensive record review and patient interview with PAT staff.   This patient has been appropriately cleared by cardiology with an overall MODERATE risk of experiencing significant perioperative cardiovascular complications. Based on clinical review performed today (03/11/23), barring any significant acute changes in the patient's overall condition, it is anticipated that he will be able to proceed with the planned surgical intervention. Any acute changes in clinical condition may necessitate his procedure being postponed and/or cancelled. Patient will meet with anesthesia team (MD and/or CRNA) on the day of his procedure for preoperative evaluation/assessment. Questions regarding anesthetic course will be fielded at that time.   Pre-surgical instructions were reviewed with the patient during his PAT appointment, and questions were fielded to satisfaction by PAT clinical staff. He has been instructed on which medications that he will need to hold prior to surgery, as well as the ones that have been deemed safe/appropriate to take on the day of his procedure. As part of the general education provided by PAT, patient made aware both  verbally and in writing, that he would need to abstain from the use of any illegal substances during his perioperative course. He was advised that failure to follow the provided instructions could necessitate case cancellation or result in serious perioperative complications up to and including death. Patient encouraged to contact PAT and/or his surgeon's office to discuss any questions or concerns that may arise prior to surgery; verbalized understanding.   Casey Mulling, MSN, APRN, FNP-C, CEN [facility]  Perioperative Services Nurse Practitioner Phone: [phone] Fax: [phone] 03/11/23 6:14 PM  NOTE: This note has been prepared using Dragon dictation software. Despite my best ability to proofread, there is always the potential that unintentional transcriptional errors may still occur from this process.

## 2023-03-12 DIAGNOSIS — N186 End stage renal disease: Secondary | ICD-10-CM | POA: Diagnosis not present

## 2023-03-12 DIAGNOSIS — Z992 Dependence on renal dialysis: Secondary | ICD-10-CM | POA: Diagnosis not present

## 2023-03-13 DIAGNOSIS — Z992 Dependence on renal dialysis: Secondary | ICD-10-CM | POA: Diagnosis not present

## 2023-03-13 DIAGNOSIS — N186 End stage renal disease: Secondary | ICD-10-CM | POA: Diagnosis not present

## 2023-03-15 ENCOUNTER — Other Ambulatory Visit: Payer: Self-pay

## 2023-03-15 ENCOUNTER — Encounter
Admission: RE | Disposition: A | Discharge status: Still In Hospital | Payer: Self-pay | Source: Home / Self Care | Attending: Urology

## 2023-03-15 ENCOUNTER — Ambulatory Visit
Admission: RE | Admit: 2023-03-15 | Discharge: 2023-03-15 | Disposition: A | Discharge status: Still In Hospital | Payer: Medicare HMO | Attending: Urology | Admitting: Urology

## 2023-03-15 ENCOUNTER — Ambulatory Visit: Payer: Medicare HMO | Admitting: Urgent Care

## 2023-03-15 ENCOUNTER — Ambulatory Visit: Payer: Medicare HMO

## 2023-03-15 ENCOUNTER — Emergency Department
Admission: EM | Admit: 2023-03-15 | Discharge: 2023-03-15 | Disposition: A | Discharge status: Home / Self Care | Payer: Medicare HMO | Source: Home / Self Care | Attending: Emergency Medicine | Admitting: Emergency Medicine

## 2023-03-15 ENCOUNTER — Encounter: Payer: Self-pay | Admitting: Urology

## 2023-03-15 DIAGNOSIS — Z992 Dependence on renal dialysis: Secondary | ICD-10-CM | POA: Insufficient documentation

## 2023-03-15 DIAGNOSIS — D631 Anemia in chronic kidney disease: Secondary | ICD-10-CM | POA: Insufficient documentation

## 2023-03-15 DIAGNOSIS — R112 Nausea with vomiting, unspecified: Secondary | ICD-10-CM | POA: Insufficient documentation

## 2023-03-15 DIAGNOSIS — N186 End stage renal disease: Secondary | ICD-10-CM | POA: Insufficient documentation

## 2023-03-15 DIAGNOSIS — J9811 Atelectasis: Secondary | ICD-10-CM | POA: Diagnosis not present

## 2023-03-15 DIAGNOSIS — Z7901 Long term (current) use of anticoagulants: Secondary | ICD-10-CM | POA: Insufficient documentation

## 2023-03-15 DIAGNOSIS — D494 Neoplasm of unspecified behavior of bladder: Secondary | ICD-10-CM

## 2023-03-15 DIAGNOSIS — I480 Paroxysmal atrial fibrillation: Secondary | ICD-10-CM | POA: Diagnosis not present

## 2023-03-15 DIAGNOSIS — E1122 Type 2 diabetes mellitus with diabetic chronic kidney disease: Secondary | ICD-10-CM | POA: Insufficient documentation

## 2023-03-15 DIAGNOSIS — I2510 Atherosclerotic heart disease of native coronary artery without angina pectoris: Secondary | ICD-10-CM | POA: Diagnosis not present

## 2023-03-15 DIAGNOSIS — I252 Old myocardial infarction: Secondary | ICD-10-CM | POA: Diagnosis not present

## 2023-03-15 DIAGNOSIS — I10 Essential (primary) hypertension: Secondary | ICD-10-CM | POA: Diagnosis not present

## 2023-03-15 DIAGNOSIS — I700 Atherosclerosis of aorta: Secondary | ICD-10-CM | POA: Diagnosis not present

## 2023-03-15 DIAGNOSIS — I120 Hypertensive chronic kidney disease with stage 5 chronic kidney disease or end stage renal disease: Secondary | ICD-10-CM | POA: Insufficient documentation

## 2023-03-15 DIAGNOSIS — Z01810 Encounter for preprocedural cardiovascular examination: Secondary | ICD-10-CM | POA: Diagnosis not present

## 2023-03-15 DIAGNOSIS — R079 Chest pain, unspecified: Secondary | ICD-10-CM | POA: Diagnosis not present

## 2023-03-15 DIAGNOSIS — Z87891 Personal history of nicotine dependence: Secondary | ICD-10-CM | POA: Insufficient documentation

## 2023-03-15 DIAGNOSIS — Z539 Procedure and treatment not carried out, unspecified reason: Secondary | ICD-10-CM | POA: Diagnosis not present

## 2023-03-15 DIAGNOSIS — Z01812 Encounter for preprocedural laboratory examination: Secondary | ICD-10-CM

## 2023-03-15 DIAGNOSIS — R61 Generalized hyperhidrosis: Secondary | ICD-10-CM | POA: Diagnosis not present

## 2023-03-15 DIAGNOSIS — K219 Gastro-esophageal reflux disease without esophagitis: Secondary | ICD-10-CM | POA: Diagnosis not present

## 2023-03-15 HISTORY — DX: Neoplasm of unspecified behavior of bladder: D49.4

## 2023-03-15 HISTORY — DX: Malformation of coronary vessels: Q24.5

## 2023-03-15 HISTORY — DX: Unstable angina: I20.0

## 2023-03-15 HISTORY — DX: Anemia in chronic kidney disease: D63.1

## 2023-03-15 HISTORY — DX: Hepatomegaly, not elsewhere classified: R16.0

## 2023-03-15 HISTORY — DX: Cyst of kidney, acquired: N28.1

## 2023-03-15 HISTORY — DX: Long term (current) use of aspirin: Z79.82

## 2023-03-15 HISTORY — DX: Type 2 diabetes mellitus without complications: E11.9

## 2023-03-15 HISTORY — DX: Other cerebrovascular disease: I67.89

## 2023-03-15 HISTORY — DX: Other specified diseases of liver: K76.89

## 2023-03-15 HISTORY — DX: Chronic kidney disease, unspecified: N18.9

## 2023-03-15 HISTORY — DX: Long term (current) use of anticoagulants: Z79.01

## 2023-03-15 LAB — CBC
HCT: 28.4 % — ABNORMAL LOW (ref 39.0–52.0)
Hemoglobin: 9.2 g/dL — ABNORMAL LOW (ref 13.0–17.0)
MCH: 31.5 pg (ref 26.0–34.0)
MCHC: 32.4 g/dL (ref 30.0–36.0)
MCV: 97.3 fL (ref 80.0–100.0)
Platelets: 233 10*3/uL (ref 150–400)
RBC: 2.92 MIL/uL — ABNORMAL LOW (ref 4.22–5.81)
RDW: 13.3 % (ref 11.5–15.5)
WBC: 10.6 10*3/uL — ABNORMAL HIGH (ref 4.0–10.5)
nRBC: 0 % (ref 0.0–0.2)

## 2023-03-15 LAB — URINALYSIS, ROUTINE W REFLEX MICROSCOPIC
Bacteria, UA: NONE SEEN
Bilirubin Urine: NEGATIVE
Glucose, UA: >=500 mg/dL — AB
Ketones, ur: 5 mg/dL — AB
Leukocytes,Ua: NEGATIVE
Nitrite: NEGATIVE
Protein, ur: >=300 mg/dL — AB
Specific Gravity, Urine: 1.01 (ref 1.005–1.030)
pH: 7 (ref 5.0–8.0)

## 2023-03-15 LAB — BASIC METABOLIC PANEL
Anion gap: 16 — ABNORMAL HIGH (ref 5–15)
BUN: 60 mg/dL — ABNORMAL HIGH (ref 8–23)
CO2: 15 mmol/L — ABNORMAL LOW (ref 22–32)
Calcium: 8.3 mg/dL — ABNORMAL LOW (ref 8.9–10.3)
Chloride: 106 mmol/L (ref 98–111)
Creatinine, Ser: 7.53 mg/dL — ABNORMAL HIGH (ref 0.61–1.24)
GFR, Estimated: 7 mL/min — ABNORMAL LOW (ref >60)
Glucose, Bld: 112 mg/dL — ABNORMAL HIGH (ref 70–99)
Potassium: 4.3 mmol/L (ref 3.5–5.1)
Sodium: 137 mmol/L (ref 135–145)

## 2023-03-15 LAB — HEPATIC FUNCTION PANEL
ALT: 14 U/L (ref 0–44)
AST: 17 U/L (ref 15–41)
Albumin: 2.7 g/dL — ABNORMAL LOW (ref 3.5–5.0)
Alkaline Phosphatase: 92 U/L (ref 38–126)
Bilirubin, Direct: <0.1 mg/dL (ref 0.0–0.2)
Total Bilirubin: 0.7 mg/dL (ref 0.0–1.2)
Total Protein: 6 g/dL — ABNORMAL LOW (ref 6.5–8.1)

## 2023-03-15 LAB — TROPONIN I (HIGH SENSITIVITY)
Troponin I (High Sensitivity): 16 ng/L (ref <18)
Troponin I (High Sensitivity): 15 ng/L (ref <18)

## 2023-03-15 LAB — POCT I-STAT, CHEM 8
BUN: 51 mg/dL — ABNORMAL HIGH (ref 8–23)
Calcium, Ion: 1.08 mmol/L — ABNORMAL LOW (ref 1.15–1.40)
Chloride: 112 mmol/L — ABNORMAL HIGH (ref 98–111)
Creatinine, Ser: 8.4 mg/dL — ABNORMAL HIGH (ref 0.61–1.24)
Glucose, Bld: 93 mg/dL (ref 70–99)
HCT: 28 % — ABNORMAL LOW (ref 39.0–52.0)
Hemoglobin: 9.5 g/dL — ABNORMAL LOW (ref 13.0–17.0)
Potassium: 4.8 mmol/L (ref 3.5–5.1)
Sodium: 136 mmol/L (ref 135–145)
TCO2: 19 mmol/L — ABNORMAL LOW (ref 22–32)

## 2023-03-15 LAB — LIPASE, BLOOD: Lipase: 50 U/L (ref 11–51)

## 2023-03-15 LAB — GLUCOSE, CAPILLARY: Glucose-Capillary: 109 mg/dL — ABNORMAL HIGH (ref 70–99)

## 2023-03-15 SURGERY — TRANSURETHRAL RESECTION OF BLADDER TUMOR (TURBT)
Anesthesia: General

## 2023-03-15 MED ORDER — SODIUM CHLORIDE 0.9 % IV SOLN: INTRAVENOUS | Status: DC

## 2023-03-15 MED ORDER — ONDANSETRON HCL 4 MG/2ML IJ SOLN
4.0000 mg | Freq: Once | INTRAMUSCULAR | Status: AC
  Administered 2023-03-15: 4 mg via INTRAVENOUS
  Filled 2023-03-15: qty 2

## 2023-03-15 MED ORDER — PROPOFOL 1000 MG/100ML IV EMUL
INTRAVENOUS | Status: AC
  Filled 2023-03-15: qty 100

## 2023-03-15 MED ORDER — ONDANSETRON 4 MG PO TBDP: 4.0000 mg | ORAL_TABLET | Freq: Three times a day (TID) | ORAL | 0 refills | Status: DC | PRN

## 2023-03-15 MED ORDER — CEFAZOLIN SODIUM-DEXTROSE 2-4 GM/100ML-% IV SOLN
INTRAVENOUS | Status: AC
  Filled 2023-03-15: qty 100

## 2023-03-15 MED ORDER — ORAL CARE MOUTH RINSE: 15.0000 mL | Freq: Once | OROMUCOSAL | Status: AC

## 2023-03-15 MED ORDER — CEFAZOLIN SODIUM-DEXTROSE 2-4 GM/100ML-% IV SOLN: 2.0000 g | INTRAVENOUS | Status: DC

## 2023-03-15 MED ORDER — GEMCITABINE CHEMO FOR BLADDER INSTILLATION 2000 MG
2000.0000 mg | Freq: Once | INTRAVENOUS | Status: DC
  Filled 2023-03-15: qty 52.6

## 2023-03-15 MED ORDER — MIDAZOLAM HCL 2 MG/2ML IJ SOLN
INTRAMUSCULAR | Status: AC
  Filled 2023-03-15: qty 2

## 2023-03-15 MED ORDER — FENTANYL CITRATE (PF) 100 MCG/2ML IJ SOLN
INTRAMUSCULAR | Status: AC
  Filled 2023-03-15: qty 2

## 2023-03-15 MED ORDER — CHLORHEXIDINE GLUCONATE 0.12 % MT SOLN
OROMUCOSAL | Status: AC
  Filled 2023-03-15: qty 15

## 2023-03-15 MED ORDER — CHLORHEXIDINE GLUCONATE 0.12 % MT SOLN
15.0000 mL | Freq: Once | OROMUCOSAL | Status: AC
  Administered 2023-03-15: 15 mL via OROMUCOSAL

## 2023-03-15 NOTE — ED Triage Notes (Signed)
Pt to ED from pre op, was being prepped for bladder surgery when started having n/v and became diaphoretic. Cardiac hx. NAD noted on arrival.

## 2023-03-15 NOTE — Progress Notes (Signed)
Patient was prepared for pre op for surgery. He started feeling diaphoretic, nauseas and vomited and had some loose stools. Vitals were checked, EKG was taken. Was is to be taken to ED per MD [place] for evaluation. Glucose 109.  Spouse at bedside

## 2023-03-15 NOTE — Anesthesia Preprocedure Evaluation (Signed)
Anesthesia Evaluation  Patient identified by MRN, date of birth, ID band Patient awake    Reviewed: Allergy & Precautions, NPO status , Patient's Chart, lab work & pertinent test results  History of Anesthesia Complications Negative for: history of anesthetic complications  Airway Mallampati: III  TM Distance: <3 FB Neck ROM: full    Dental  (+) Upper Dentures, Lower Dentures   Pulmonary neg pulmonary ROS, neg shortness of breath, former smoker   Pulmonary exam normal        Cardiovascular Exercise Tolerance: Good hypertension, (-) angina + CAD, + Past MI and + Cardiac Stents  Normal cardiovascular exam     Neuro/Psych  PSYCHIATRIC DISORDERS      negative neurological ROS     GI/Hepatic Neg liver ROS, PUD,GERD  Controlled,,  Endo/Other  diabetes, Type 2    Renal/GU DialysisRenal disease     Musculoskeletal   Abdominal   Peds  Hematology negative hematology ROS (+)   Anesthesia Other Findings Patient has cardiac clearance for this procedure.   Past Medical History: 06/14/2014: Acute diverticulitis 2003: Acute ST elevation myocardial infarction (STEMI) of inferior  wall (HCC)     Comment:  a.) PCI with Cypher stent to the RCA No date: Anemia of chronic renal failure No date: Aortic atherosclerosis (HCC) No date: Bladder tumor 06/2022: CAP (community acquired pneumonia) No date: Cerebral microvascular disease 2003: Coronary artery disease involving native coronary artery of  native heart without angina pectoris     Comment:  a.) PCI with Cypher stent to the RCA 2003; b.) LHC               08/2003: 100% dRCA, 60% pLAD - med mgmt; c.) LHC/PCI               08/21/2015: occluded RCA at site of prior stent               placement, 70% mLAD (FFR 0.79; 3.5 x 12 mm Xience DES),               50% dLAD (FFR 0.70); d.) MV 11/11/2016: no ischemia No date: Depression No date: Diverticulosis No date: End-stage renal  disease on peritoneal dialysis (HCC) No date: GERD (gastroesophageal reflux disease) 05/15/2019: GI bleed No date: Gout No date: Heart murmur 05/15/2019: Hematemesis No date: Hepatic cyst No date: Hepatomegaly No date: Hyperlipidemia No date: Hypertension 02/11/2007: Lacunar infarction [facility])     Comment:  a.) CT head 02/11/2007: lacunar infarction involving               thalamus and external capsule region on the RIGHT; b.)               MRI brain 02/03/2020: multiple remote lacunar infarcts               involving the RIGHT basal ganglia, RIGHT thalamus, and               LEFT middle cerebellar peduncle No date: Long-term use of aspirin therapy 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 No date: On apixaban therapy 09/2022: Pancolitis (HCC) No date: Paroxysmal atrial fibrillation (HCC)     Comment:  a.) CHA2DS2VASc = 6 (age, HTN, CVA x 2, prior               MI/vascular disease, T2DM) as of 03/11/2023; b.)               rate/rhythm maintained on oral metoprolol succinate;  chronically anticoagulated with dose reduced apixaban No date: Renal cyst, right 03/2022: Sigmoid diverticulitis No date: T2DM (type 2 diabetes mellitus) (HCC) 03/24/2013: Traumatic skull fracture and resulting subarachnoid  hemorrhage     Comment:  a.) MOI was slipping and falling on ice No date: Unstable angina [facility])  Past Surgical History: 08/13/2020: CAPD INSERTION; N/A     Comment:  Procedure: LAPAROSCOPIC INSERTION CONTINUOUS AMBULATORY               PERITONEAL DIALYSIS  (CAPD) CATHETER;  Surgeon:               Campbell Lerner, MD;  Location: [facility] ORS;  Service:               General;  Laterality: N/A; No date: CARDIAC CATHETERIZATION 07/05/2019: CATARACT EXTRACTION W/PHACO; Right     Comment:  Procedure: CATARACT EXTRACTION PHACO AND INTRAOCULAR               LENS PLACEMENT (IOC) RIGHT DIABETIC;  Surgeon: Galen Manila, MD;  Location: [facility] ORS;  Service:                Ophthalmology;  Laterality: Right;                US00:59.3 CDE8.41 LOT [id number] h 04/01/2017: COLONOSCOPY WITH PROPOFOL; N/A     Comment:  Procedure: COLONOSCOPY WITH PROPOFOL;  Surgeon:               Christena Deem, MD;  Location: [facility] ENDOSCOPY;                Service: Endoscopy;  Laterality: N/A; 10/2001: CORONARY ANGIOPLASTY WITH STENT PLACEMENT     Comment:  RCA 08/21/2015: CORONARY ANGIOPLASTY WITH STENT PLACEMENT; Left     Comment:  Procedure: CORONARY ANGIOPLASTY WITH STENT PLACEMENT               (mid LAD); Location: [facility]; Surgeon: Willene Hatchet, MD 2015: HAND SURGERY     Comment:  3 screws in right hand 04/22/2020: TOTAL HIP ARTHROPLASTY; Left     Comment:  Procedure: TOTAL HIP ARTHROPLASTY ANTERIOR APPROACH;                Surgeon: Kennedy Bucker, MD;  Location: [facility] ORS;                Service: Orthopedics;  Laterality: Left;     Reproductive/Obstetrics negative OB ROS                             Anesthesia Physical Anesthesia Plan  ASA: 4  Anesthesia Plan: General LMA   Post-op Pain Management:    Induction: Intravenous  PONV Risk Score and Plan: Dexamethasone, Ondansetron, Midazolam and Treatment may vary due to age or medical condition  Airway Management Planned: LMA  Additional Equipment:   Intra-op Plan:   Post-operative Plan: Extubation in OR  Informed Consent: I have reviewed the patients History and Physical, chart, labs and discussed the procedure including the risks, benefits and alternatives for the proposed anesthesia with the patient or authorized representative who has indicated his/her understanding and acceptance.     Dental Advisory Given  Plan Discussed with: Anesthesiologist, CRNA and Surgeon  Anesthesia Plan Comments: (Patient consented for risks of anesthesia including but not limited to:  - adverse reactions to medications - damage to eyes, teeth, lips or  other oral mucosa - nerve damage due to  positioning  - sore throat or hoarseness - Damage to heart, brain, nerves, lungs, other parts of body or loss of life  Patient voiced understanding and assent.)       Anesthesia Quick Evaluation

## 2023-03-15 NOTE — ED Provider Notes (Signed)
[facility] Provider Note    Event Date/Time   First MD Initiated Contact with Patient 03/15/23 1144     (approximate)   History   Nausea   HPI  Casey French is a 73 y.o. male who presents to the ED for evaluation of Nausea   I review a urology clinic visit from October.  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 He otherwise has a coronary history, A-fib on Eliquis, GERD, DM.  ESRD on peritoneal dialysis and still produces some urine.  Patient presents to the ED from the preoperative area due to an episode of emesis.  Patient reports poor sleep last night and a lot of anxiety about the upcoming procedure and the possibility of a bladder tumor.  Reports a "normal" bowel movement last night.  He is feeling okay and then in the preoperative area he reports feeling nervous.  Reports getting flushed and having an episode of emesis and shortly after this had a small-volume episode of loose stool without blood.  No abdominal pain.  Reports feeling a bit better now that he is in the ER, has no further episodes of emesis or recurrence of pain.    Physical Exam   Triage Vital Signs: ED Triage Vitals [03/15/23 0917]  Encounter Vitals Group     BP (!) 162/94     Systolic BP Percentile      Diastolic BP Percentile      Pulse Rate 80     Resp 18     Temp 98.1 F (36.7 C)     Temp Source Oral     SpO2 99 %     Weight 131 lb (59.4 kg)     Height 5\' 9"  (1.753 m)     Head Circumference      Peak Flow      Pain Score 0     Pain Loc      Pain Education      Exclude from Growth Chart     Most recent vital signs: Vitals:   03/15/23 0917  BP: (!) 162/94  Pulse: 80  Resp: 18  Temp: 98.1 F (36.7 C)  SpO2: 99%    General: Awake,  no distress.  CV:  Good peripheral perfusion.  Resp:  Normal effort.  Abd:  No distention.  Soft and benign, no tenderness MSK:  No deformity noted.  Neuro:  No focal deficits appreciated. Other:     ED Results / Procedures / Treatments   Labs (all labs ordered are listed, but only abnormal results are displayed) Labs Reviewed  BASIC METABOLIC PANEL - Abnormal; Notable for the following components:      Result Value   CO2 15 (*)    Glucose, Bld 112 (*)    BUN 60 (*)    Creatinine, Ser 7.53 (*)    Calcium 8.3 (*)    GFR, Estimated 7 (*)    Anion gap 16 (*)    All other components within normal limits  CBC - Abnormal; Notable for the following components:   WBC 10.6 (*)    RBC 2.92 (*)    Hemoglobin 9.2 (*)    HCT 28.4 (*)    All other components within normal limits  HEPATIC FUNCTION  PANEL - Abnormal; Notable for the following components:   Total Protein 6.0 (*)    Albumin 2.7 (*)    All other components within normal limits  URINALYSIS, ROUTINE W REFLEX MICROSCOPIC - Abnormal; Notable for the following components:   Color, Urine STRAW (*)    APPearance CLEAR (*)    Glucose, UA >=500 (*)    Hgb urine dipstick MODERATE (*)    Ketones, ur 5 (*)    Protein, ur >=300 (*)    All other components within normal limits  LIPASE, BLOOD  TROPONIN I (HIGH SENSITIVITY)  TROPONIN I (HIGH SENSITIVITY)    EKG Sinus rhythm with a rate of 80 bpm.  Normal axis and intervals.  Signs of LVH.  No clear signs of acute ischemia.  RADIOLOGY CXR interpreted by me without evidence of acute cardiopulmonary pathology.  Official radiology report(s): DG Chest 2 View Result Date: 03/15/2023 CLINICAL DATA:  Chest pain.  Nausea vomiting and diaphoresis. EXAM: CHEST - 2 VIEW COMPARISON:  Chest radiograph dated 10/07/2022. FINDINGS: Right lung base linear atelectasis/scarring. No focal consolidation, pleural effusion, or pneumothorax. The cardiac silhouette is within normal limits. Coronary  vascular calcification and atherosclerotic calcification of the aorta. Degenerative changes of the spine. No acute osseous pathology. IMPRESSION: No active cardiopulmonary disease. Electronically Signed   By: Elgie Collard M.D.   On: 03/15/2023 12:24    PROCEDURES and INTERVENTIONS:  Procedures  Medications  ondansetron (ZOFRAN) injection 4 mg (4 mg Intravenous Given 03/15/23 1248)     IMPRESSION / MDM / ASSESSMENT AND PLAN / ED COURSE  I reviewed the triage vital signs and the nursing notes.  Differential diagnosis includes, but is not limited to, SBO, SBP, pancreatitis, UTI, anxiety  {Patient presents with symptoms of an acute illness or injury that is potentially life-threatening.  Patient presents to the ED from preoperative area after an episode of nausea and emesis.  Has a benign workup, no recurrence of symptoms and suitable for outpatient management.  Possibly related to anxiety prior to his procedure.  Marginal leukocytosis of 10.6 is noted but he is no abdominal pain or tenderness I doubt SBP.  Chronic anemia, signs of CKD without acute metabolic changes.  Normal lipase, troponins, LFTs and UA.  He is tolerating p.o. intake, has controlled symptoms with antiemetics and suitable for outpatient management.  Clinical Course as of 03/15/23 1323  Tue Mar 15, 2023  1321 Reassessed.  Patient ports feeling well after Zofran.  Reexamined his abdomen.  Discussed reassuring workup and possible etiologies of symptoms.  He is wanting to go home I think this is reasonable.  We discussed ED return precautions, urology follow-up. [DS]    Clinical Course User Index [DS] Delton Prairie, MD     FINAL CLINICAL IMPRESSION(S) / ED DIAGNOSES   Final diagnoses:  Nausea and vomiting, unspecified vomiting type     Rx / DC Orders   ED Discharge Orders          Ordered    ondansetron (ZOFRAN-ODT) 4 MG disintegrating tablet  Every 8 hours PRN        03/15/23 1320             Note:   This document was prepared using Dragon voice recognition software and may include unintentional dictation errors.   Delton Prairie, MD 03/15/23 1324

## 2023-03-16 DIAGNOSIS — N186 End stage renal disease: Secondary | ICD-10-CM | POA: Diagnosis not present

## 2023-03-16 DIAGNOSIS — Z992 Dependence on renal dialysis: Secondary | ICD-10-CM | POA: Diagnosis not present

## 2023-03-17 DIAGNOSIS — N186 End stage renal disease: Secondary | ICD-10-CM | POA: Diagnosis not present

## 2023-03-17 DIAGNOSIS — Z992 Dependence on renal dialysis: Secondary | ICD-10-CM | POA: Diagnosis not present

## 2023-03-18 DIAGNOSIS — N186 End stage renal disease: Secondary | ICD-10-CM | POA: Diagnosis not present

## 2023-03-18 DIAGNOSIS — Z992 Dependence on renal dialysis: Secondary | ICD-10-CM | POA: Diagnosis not present

## 2023-03-19 DIAGNOSIS — Z992 Dependence on renal dialysis: Secondary | ICD-10-CM | POA: Diagnosis not present

## 2023-03-19 DIAGNOSIS — N186 End stage renal disease: Secondary | ICD-10-CM | POA: Diagnosis not present

## 2023-03-20 DIAGNOSIS — N186 End stage renal disease: Secondary | ICD-10-CM | POA: Diagnosis not present

## 2023-03-20 DIAGNOSIS — Z992 Dependence on renal dialysis: Secondary | ICD-10-CM | POA: Diagnosis not present

## 2023-03-21 ENCOUNTER — Other Ambulatory Visit: Payer: Self-pay

## 2023-03-21 DIAGNOSIS — Z992 Dependence on renal dialysis: Secondary | ICD-10-CM | POA: Diagnosis not present

## 2023-03-21 DIAGNOSIS — D494 Neoplasm of unspecified behavior of bladder: Secondary | ICD-10-CM

## 2023-03-21 DIAGNOSIS — N186 End stage renal disease: Secondary | ICD-10-CM | POA: Diagnosis not present

## 2023-03-21 NOTE — Progress Notes (Unsigned)
 Surgical Physician Order Form [facility]   Dr. Irineo Axon, MD   * Scheduling expectation : Next Available   *Length of Case: 30 minutes   *Clearance needed: yes-cardiology   *Anticoagulation Instructions: Hold all anticoagulants-on Eliquis   *Aspirin Instructions: Ok to continue Aspirin   *Post-op visit Date/Instructions:  1-2 week with pathology review   *Diagnosis: Bladder Tumor   *Procedure:  TURBT 2-5cm (96045)     Additional orders: Gemcitabine 2000mg  bladder instillation   -Admit type: OUTpatient   -Anesthesia: Choice   -VTE Prophylaxis Standing Order SCD's       Other:    -Standing Lab Orders Per Anesthesia     Lab other: UA&Urine Culture   -Standing Test orders EKG/Chest x-ray per Anesthesia             Test other:    - Medications:  Ancef 2gm IV   -Other orders:  N/A

## 2023-03-22 ENCOUNTER — Telehealth: Payer: Self-pay

## 2023-03-22 DIAGNOSIS — Z992 Dependence on renal dialysis: Secondary | ICD-10-CM | POA: Diagnosis not present

## 2023-03-22 DIAGNOSIS — N186 End stage renal disease: Secondary | ICD-10-CM | POA: Diagnosis not present

## 2023-03-22 NOTE — Telephone Encounter (Signed)
  Per Dr. Lonna Cobb, Patient is to be scheduled for Transurethral Resection of Bladder Tumor with Intravesical Instillation of Gemcitabine  Casey French and wife was contacted and possible surgical dates were discussed, 04/05/2023 was agreed upon for surgery.   Patient was directed to call [phone] between 1-3pm the day before surgery to find out surgical arrival time.  Instructions were given not to eat or drink from midnight on the night before surgery and have a driver for the day of surgery. On the surgery day patient was instructed to enter through the Medical Mall entrance of [facility] report the Same Day Surgery desk.   Pre-Admit Testing will be in contact via phone to set up an interview with the anesthesia team to review your history and medications prior to surgery.   Reminder of this information was sent via MyChart and Mail to the patient.

## 2023-03-22 NOTE — Progress Notes (Signed)
   [facility] Surgical Posting Form  Surgery Date: Date: 04/05/2023  Surgeon: Dr. Irineo Axon, MD  Inpt ( No  )   Outpt (Yes)   Obs ( No  )   Diagnosis: D49.4 Bladder Tumor  -CPT: 09811, [phone]  Surgery: Transurethral Resection of Bladder Tumor with Intravesical Instillation of Gemcitabine  Stop Anticoagulations: Yes and will need to hold Eliquis for 2 days prior to surgery  Cardiac/Medical/Pulmonary Clearance needed: yes, obtained already  *Orders entered into EPIC  Date: 03/22/23   *Case booked in [place]  Date: 03/22/23  *Notified pt of Surgery: Date: 03/22/23  PRE-OP UA & CX: no  *Placed into Prior Authorization Work [place] Date: 03/22/23  Assistant/laser/rep:No

## 2023-03-23 DIAGNOSIS — Z961 Presence of intraocular lens: Secondary | ICD-10-CM | POA: Diagnosis not present

## 2023-03-23 DIAGNOSIS — H34831 Tributary (branch) retinal vein occlusion, right eye, with macular edema: Secondary | ICD-10-CM | POA: Diagnosis not present

## 2023-03-23 DIAGNOSIS — H26491 Other secondary cataract, right eye: Secondary | ICD-10-CM | POA: Diagnosis not present

## 2023-03-23 DIAGNOSIS — H2512 Age-related nuclear cataract, left eye: Secondary | ICD-10-CM | POA: Diagnosis not present

## 2023-03-24 DIAGNOSIS — N186 End stage renal disease: Secondary | ICD-10-CM | POA: Diagnosis not present

## 2023-03-24 DIAGNOSIS — E1122 Type 2 diabetes mellitus with diabetic chronic kidney disease: Secondary | ICD-10-CM | POA: Diagnosis not present

## 2023-03-24 DIAGNOSIS — R197 Diarrhea, unspecified: Secondary | ICD-10-CM | POA: Diagnosis not present

## 2023-03-24 DIAGNOSIS — I480 Paroxysmal atrial fibrillation: Secondary | ICD-10-CM | POA: Diagnosis not present

## 2023-03-24 DIAGNOSIS — Z992 Dependence on renal dialysis: Secondary | ICD-10-CM | POA: Diagnosis not present

## 2023-03-25 DIAGNOSIS — N186 End stage renal disease: Secondary | ICD-10-CM | POA: Diagnosis not present

## 2023-03-25 DIAGNOSIS — Z992 Dependence on renal dialysis: Secondary | ICD-10-CM | POA: Diagnosis not present

## 2023-03-26 DIAGNOSIS — N186 End stage renal disease: Secondary | ICD-10-CM | POA: Diagnosis not present

## 2023-03-26 DIAGNOSIS — Z992 Dependence on renal dialysis: Secondary | ICD-10-CM | POA: Diagnosis not present

## 2023-03-27 DIAGNOSIS — Z992 Dependence on renal dialysis: Secondary | ICD-10-CM | POA: Diagnosis not present

## 2023-03-27 DIAGNOSIS — N186 End stage renal disease: Secondary | ICD-10-CM | POA: Diagnosis not present

## 2023-03-28 ENCOUNTER — Encounter
Admission: RE | Admit: 2023-03-28 | Discharge: 2023-03-28 | Disposition: A | Discharge status: Home / Self Care | Payer: Medicare HMO | Source: Ambulatory Visit | Attending: Urology | Admitting: Urology

## 2023-03-28 DIAGNOSIS — Z992 Dependence on renal dialysis: Secondary | ICD-10-CM | POA: Diagnosis not present

## 2023-03-28 DIAGNOSIS — N186 End stage renal disease: Secondary | ICD-10-CM | POA: Diagnosis not present

## 2023-03-28 NOTE — Patient Instructions (Addendum)
 Your procedure is scheduled on:04-05-23  Tuesday Report to the Registration Desk on the 1st floor of the Medical Mall.Then proceed to the 2nd floor Surgery Desk To find out your arrival time, please call [phone] between 1PM - 3PM on:04-04-23 Monday If your arrival time is 6:00 am, do not arrive before that time as the Medical Mall entrance doors do not open until 6:00 am.  REMEMBER: Instructions that are not followed completely may result in serious medical risk, up to and including death; or upon the discretion of your surgeon and anesthesiologist your surgery may need to be rescheduled.  Do not eat food OR drink any liquids after midnight the night before surgery.  No gum chewing or hard candies.  One week prior to surgery:Stop NOW (03-27-[birth year]) Stop Anti-inflammatories (NSAIDS) such as Advil, Aleve, Ibuprofen, Motrin, Naproxen, Naprosyn and Aspirin based products such as Excedrin, Goody's Powder, BC Powder. Stop ANY OVER THE COUNTER supplements until after surgery   You may however, continue to take Tylenol if needed for pain up until the day of surgery.  Stop apixaban (ELIQUIS) 2 days prior to surgery-Last dose will be on 04-02-23 Saturday  Continue taking all of your other prescription medications up until the day of surgery.  ON THE DAY OF SURGERY ONLY TAKE THESE MEDICATIONS WITH SIPS OF WATER: -amLODipine (NORVASC)  -escitalopram (LEXAPRO)  -isosorbide mononitrate (IMDUR)  -metoprolol succinate (TOPROL-XL)  -omeprazole (PRILOSEC)  -rosuvastatin (CRESTOR)   Continue your 81 mg Aspirin up until the day prior to surgery-Do NOT take the morning of surgery  No Alcohol for 24 hours before or after surgery.  No Smoking including e-cigarettes for 24 hours before surgery.  No chewable tobacco products for at least 6 hours before surgery.  No nicotine patches on the day of surgery.  Do not use any "recreational" drugs for at least a week (preferably 2 weeks) before your surgery.   Please be advised that the combination of cocaine and anesthesia may have negative outcomes, up to and including death. If you test positive for cocaine, your surgery will be cancelled.  On the morning of surgery brush your teeth with toothpaste and water, you may rinse your mouth with mouthwash if you wish. Do not swallow any toothpaste or mouthwash.  Do not wear jewelry, make-up, hairpins, clips or nail polish.  For welded (permanent) jewelry: bracelets, anklets, waist bands, etc.  Please have this removed prior to surgery.  If it is not removed, there is a chance that hospital personnel will need to cut it off on the day of surgery.  Do not wear lotions, powders, or perfumes.   Do not shave body hair from the neck down 48 hours before surgery.  Contact lenses, hearing aids and dentures may not be worn into surgery.  Do not bring valuables to the hospital. [facility] is not responsible for any missing/lost belongings or valuables.   Notify your doctor if there is any change in your medical condition (cold, fever, infection).  Wear comfortable clothing (specific to your surgery type) to the hospital.  After surgery, you can help prevent lung complications by doing breathing exercises.  Take deep breaths and cough every 1-2 hours. Your doctor may order a device called an Incentive Spirometer to help you take deep breaths. When coughing or sneezing, hold a pillow firmly against your incision with both hands. This is called "splinting." Doing this helps protect your incision. It also decreases belly discomfort.  If you are being admitted to the hospital  overnight, leave your suitcase in the car. After surgery it may be brought to your room.  In case of increased patient census, it may be necessary for you, the patient, to continue your postoperative care in the Same Day Surgery department.  If you are being discharged the day of surgery, you will not be allowed to drive home. You will  need a responsible individual to drive you home and stay with you for 24 hours after surgery.   If you are taking public transportation, you will need to have a responsible individual with you.  Please call the Pre-admissions Testing Dept. at [phone] if you have any questions about these instructions.  Surgery Visitation Policy:  Patients having surgery or a procedure may have two visitors.  Children under the age of 31 must have an adult with them who is not the patient.  Temporary Visitor Restrictions Due to increasing cases of flu, RSV and COVID-19: Children ages 65 and under will not be able to visit patients in [facility] hospitals under most circumstances.

## 2023-03-28 NOTE — Progress Notes (Signed)
  F/u anesthesia phone call after pt was in preop for urology surgery with Dr Lonna Cobb and pt became clammy, elevated bp, n/v/d-Pt did not have surgery and went to ED. Everything came back normal in ED so they let pt go home. ED thinking it was related to anxiety about procedure. Pt states he is doing much better now. Diarrhea is completely resolved

## 2023-03-29 DIAGNOSIS — Z992 Dependence on renal dialysis: Secondary | ICD-10-CM | POA: Diagnosis not present

## 2023-03-29 DIAGNOSIS — N186 End stage renal disease: Secondary | ICD-10-CM | POA: Diagnosis not present

## 2023-03-30 DIAGNOSIS — N186 End stage renal disease: Secondary | ICD-10-CM | POA: Diagnosis not present

## 2023-03-30 DIAGNOSIS — Z992 Dependence on renal dialysis: Secondary | ICD-10-CM | POA: Diagnosis not present

## 2023-03-31 DIAGNOSIS — Z992 Dependence on renal dialysis: Secondary | ICD-10-CM | POA: Diagnosis not present

## 2023-03-31 DIAGNOSIS — N186 End stage renal disease: Secondary | ICD-10-CM | POA: Diagnosis not present

## 2023-04-01 DIAGNOSIS — Z992 Dependence on renal dialysis: Secondary | ICD-10-CM | POA: Diagnosis not present

## 2023-04-01 DIAGNOSIS — N186 End stage renal disease: Secondary | ICD-10-CM | POA: Diagnosis not present

## 2023-04-02 DIAGNOSIS — Z992 Dependence on renal dialysis: Secondary | ICD-10-CM | POA: Diagnosis not present

## 2023-04-02 DIAGNOSIS — N186 End stage renal disease: Secondary | ICD-10-CM | POA: Diagnosis not present

## 2023-04-03 DIAGNOSIS — N186 End stage renal disease: Secondary | ICD-10-CM | POA: Diagnosis not present

## 2023-04-03 DIAGNOSIS — Z992 Dependence on renal dialysis: Secondary | ICD-10-CM | POA: Diagnosis not present

## 2023-04-04 DIAGNOSIS — Z992 Dependence on renal dialysis: Secondary | ICD-10-CM | POA: Diagnosis not present

## 2023-04-04 DIAGNOSIS — N186 End stage renal disease: Secondary | ICD-10-CM | POA: Diagnosis not present

## 2023-04-04 MED ORDER — ORAL CARE MOUTH RINSE: 15.0000 mL | Freq: Once | OROMUCOSAL | Status: AC

## 2023-04-04 MED ORDER — CHLORHEXIDINE GLUCONATE 0.12 % MT SOLN
15.0000 mL | Freq: Once | OROMUCOSAL | Status: AC
  Administered 2023-04-05: 15 mL via OROMUCOSAL

## 2023-04-04 MED ORDER — SODIUM CHLORIDE 0.9 % IV SOLN: INTRAVENOUS | Status: DC

## 2023-04-04 MED ORDER — CEFAZOLIN SODIUM-DEXTROSE 2-4 GM/100ML-% IV SOLN
2.0000 g | INTRAVENOUS | Status: AC
  Administered 2023-04-05: 2 g via INTRAVENOUS

## 2023-04-05 ENCOUNTER — Ambulatory Visit: Admitting: Anesthesiology

## 2023-04-05 ENCOUNTER — Encounter: Payer: Self-pay | Admitting: Urology

## 2023-04-05 ENCOUNTER — Ambulatory Visit
Admission: RE | Admit: 2023-04-05 | Discharge: 2023-04-05 | Disposition: A | Discharge status: Home / Self Care | Payer: Medicare HMO | Attending: Urology | Admitting: Urology

## 2023-04-05 ENCOUNTER — Encounter
Admission: RE | Disposition: A | Discharge status: Home / Self Care | Payer: Self-pay | Source: Home / Self Care | Attending: Urology

## 2023-04-05 ENCOUNTER — Ambulatory Visit: Payer: Self-pay | Admitting: Urgent Care

## 2023-04-05 ENCOUNTER — Other Ambulatory Visit: Payer: Self-pay

## 2023-04-05 DIAGNOSIS — N186 End stage renal disease: Secondary | ICD-10-CM | POA: Insufficient documentation

## 2023-04-05 DIAGNOSIS — Z992 Dependence on renal dialysis: Secondary | ICD-10-CM | POA: Diagnosis not present

## 2023-04-05 DIAGNOSIS — I252 Old myocardial infarction: Secondary | ICD-10-CM | POA: Insufficient documentation

## 2023-04-05 DIAGNOSIS — Z87891 Personal history of nicotine dependence: Secondary | ICD-10-CM | POA: Diagnosis not present

## 2023-04-05 DIAGNOSIS — K219 Gastro-esophageal reflux disease without esophagitis: Secondary | ICD-10-CM | POA: Diagnosis not present

## 2023-04-05 DIAGNOSIS — C674 Malignant neoplasm of posterior wall of bladder: Secondary | ICD-10-CM | POA: Diagnosis not present

## 2023-04-05 DIAGNOSIS — I120 Hypertensive chronic kidney disease with stage 5 chronic kidney disease or end stage renal disease: Secondary | ICD-10-CM | POA: Diagnosis not present

## 2023-04-05 DIAGNOSIS — D494 Neoplasm of unspecified behavior of bladder: Secondary | ICD-10-CM

## 2023-04-05 DIAGNOSIS — Z8673 Personal history of transient ischemic attack (TIA), and cerebral infarction without residual deficits: Secondary | ICD-10-CM | POA: Insufficient documentation

## 2023-04-05 DIAGNOSIS — I480 Paroxysmal atrial fibrillation: Secondary | ICD-10-CM | POA: Diagnosis not present

## 2023-04-05 DIAGNOSIS — Z955 Presence of coronary angioplasty implant and graft: Secondary | ICD-10-CM | POA: Insufficient documentation

## 2023-04-05 DIAGNOSIS — I2510 Atherosclerotic heart disease of native coronary artery without angina pectoris: Secondary | ICD-10-CM | POA: Insufficient documentation

## 2023-04-05 DIAGNOSIS — E1122 Type 2 diabetes mellitus with diabetic chronic kidney disease: Secondary | ICD-10-CM | POA: Insufficient documentation

## 2023-04-05 LAB — POCT I-STAT, CHEM 8
BUN: 36 mg/dL — ABNORMAL HIGH (ref 8–23)
Calcium, Ion: 0.95 mmol/L — ABNORMAL LOW (ref 1.15–1.40)
Chloride: 104 mmol/L (ref 98–111)
Creatinine, Ser: 7.2 mg/dL — ABNORMAL HIGH (ref 0.61–1.24)
Glucose, Bld: 83 mg/dL (ref 70–99)
HCT: 25 % — ABNORMAL LOW (ref 39.0–52.0)
Hemoglobin: 8.5 g/dL — ABNORMAL LOW (ref 13.0–17.0)
Potassium: 4.6 mmol/L (ref 3.5–5.1)
Sodium: 135 mmol/L (ref 135–145)
TCO2: 20 mmol/L — ABNORMAL LOW (ref 22–32)

## 2023-04-05 LAB — GLUCOSE, CAPILLARY: Glucose-Capillary: 133 mg/dL — ABNORMAL HIGH (ref 70–99)

## 2023-04-05 SURGERY — TURBT (TRANSURETHRAL RESECTION OF BLADDER TUMOR)
Anesthesia: General | Site: Bladder

## 2023-04-05 MED ORDER — PROPOFOL 10 MG/ML IV BOLUS
INTRAVENOUS | Status: AC
  Filled 2023-04-05: qty 20

## 2023-04-05 MED ORDER — ACETAMINOPHEN 10 MG/ML IV SOLN
INTRAVENOUS | Status: DC | PRN
  Administered 2023-04-05: 1000 mg via INTRAVENOUS

## 2023-04-05 MED ORDER — DEXAMETHASONE SODIUM PHOSPHATE 10 MG/ML IJ SOLN
INTRAMUSCULAR | Status: DC | PRN
  Administered 2023-04-05: 5 mg via INTRAVENOUS

## 2023-04-05 MED ORDER — FENTANYL CITRATE (PF) 100 MCG/2ML IJ SOLN
INTRAMUSCULAR | Status: DC | PRN
  Administered 2023-04-05: 50 ug via INTRAVENOUS
  Administered 2023-04-05 (×2): 25 ug via INTRAVENOUS

## 2023-04-05 MED ORDER — FENTANYL CITRATE (PF) 100 MCG/2ML IJ SOLN
INTRAMUSCULAR | Status: AC
  Filled 2023-04-05: qty 2

## 2023-04-05 MED ORDER — CEFAZOLIN SODIUM-DEXTROSE 2-4 GM/100ML-% IV SOLN
INTRAVENOUS | Status: AC
  Filled 2023-04-05: qty 100

## 2023-04-05 MED ORDER — EPHEDRINE SULFATE-NACL 50-0.9 MG/10ML-% IV SOSY
PREFILLED_SYRINGE | INTRAVENOUS | Status: DC | PRN
  Administered 2023-04-05: 5 mg via INTRAVENOUS
  Administered 2023-04-05: 10 mg via INTRAVENOUS
  Administered 2023-04-05: 7.5 mg via INTRAVENOUS
  Administered 2023-04-05: 10 mg via INTRAVENOUS
  Administered 2023-04-05: 5 mg via INTRAVENOUS
  Administered 2023-04-05: 12.5 mg via INTRAVENOUS

## 2023-04-05 MED ORDER — OXYCODONE HCL 5 MG/5ML PO SOLN: 5.0000 mg | Freq: Once | ORAL | Status: AC | PRN

## 2023-04-05 MED ORDER — SODIUM CHLORIDE 0.9 % IR SOLN
Status: DC | PRN
Start: 2023-04-05 — End: 2023-04-05
  Administered 2023-04-05 (×2): 3000 mL via INTRAVESICAL

## 2023-04-05 MED ORDER — GEMCITABINE CHEMO FOR BLADDER INSTILLATION 2000 MG
2000.0000 mg | Freq: Once | INTRAVENOUS | Status: DC
  Filled 2023-04-05: qty 52.6

## 2023-04-05 MED ORDER — HYDROCODONE-ACETAMINOPHEN 5-325 MG PO TABS: 1.0000 | ORAL_TABLET | Freq: Four times a day (QID) | ORAL | 0 refills | Status: DC | PRN

## 2023-04-05 MED ORDER — ACETAMINOPHEN 10 MG/ML IV SOLN: 1000.0000 mg | Freq: Once | INTRAVENOUS | Status: DC | PRN

## 2023-04-05 MED ORDER — PROPOFOL 10 MG/ML IV BOLUS
INTRAVENOUS | Status: DC | PRN
  Administered 2023-04-05: 120 mg via INTRAVENOUS
  Administered 2023-04-05: 20 mg via INTRAVENOUS

## 2023-04-05 MED ORDER — LIDOCAINE HCL (PF) 2 % IJ SOLN
INTRAMUSCULAR | Status: AC
  Filled 2023-04-05: qty 5

## 2023-04-05 MED ORDER — ONDANSETRON HCL 4 MG/2ML IJ SOLN
INTRAMUSCULAR | Status: AC
  Filled 2023-04-05: qty 2

## 2023-04-05 MED ORDER — GEMCITABINE CHEMO FOR BLADDER INSTILLATION 2000 MG
INTRAVENOUS | Status: DC | PRN
  Administered 2023-04-05: 2000 mg via INTRAVESICAL

## 2023-04-05 MED ORDER — ACETAMINOPHEN 10 MG/ML IV SOLN
INTRAVENOUS | Status: AC
  Filled 2023-04-05: qty 100

## 2023-04-05 MED ORDER — LACTATED RINGERS IV SOLN: INTRAVENOUS | Status: DC | PRN

## 2023-04-05 MED ORDER — FENTANYL CITRATE (PF) 100 MCG/2ML IJ SOLN
25.0000 ug | INTRAMUSCULAR | Status: DC | PRN
  Administered 2023-04-05 (×2): 25 ug via INTRAVENOUS

## 2023-04-05 MED ORDER — LIDOCAINE HCL (CARDIAC) PF 100 MG/5ML IV SOSY
PREFILLED_SYRINGE | INTRAVENOUS | Status: DC | PRN
  Administered 2023-04-05: 50 mg via INTRAVENOUS

## 2023-04-05 MED ORDER — ONDANSETRON HCL 4 MG/2ML IJ SOLN
INTRAMUSCULAR | Status: DC | PRN
  Administered 2023-04-05: 4 mg via INTRAVENOUS

## 2023-04-05 MED ORDER — DROPERIDOL 2.5 MG/ML IJ SOLN: 0.6250 mg | Freq: Once | INTRAMUSCULAR | Status: DC | PRN

## 2023-04-05 MED ORDER — DEXAMETHASONE SODIUM PHOSPHATE 10 MG/ML IJ SOLN
INTRAMUSCULAR | Status: AC
  Filled 2023-04-05: qty 1

## 2023-04-05 MED ORDER — EPHEDRINE 5 MG/ML INJ
INTRAVENOUS | Status: AC
  Filled 2023-04-05: qty 5

## 2023-04-05 MED ORDER — VASOPRESSIN 20 UNIT/ML IV SOLN
INTRAVENOUS | Status: DC | PRN
Start: 2023-04-05 — End: 2023-04-05
  Administered 2023-04-05 (×2): 1 [IU] via INTRAVENOUS

## 2023-04-05 MED ORDER — OXYCODONE HCL 5 MG PO TABS
5.0000 mg | ORAL_TABLET | Freq: Once | ORAL | Status: AC | PRN
  Administered 2023-04-05: 5 mg via ORAL

## 2023-04-05 MED ORDER — CHLORHEXIDINE GLUCONATE 0.12 % MT SOLN
OROMUCOSAL | Status: AC
  Filled 2023-04-05: qty 15

## 2023-04-05 MED ORDER — VASOPRESSIN 20 UNIT/ML IV SOLN
INTRAVENOUS | Status: AC
  Filled 2023-04-05: qty 1

## 2023-04-05 MED ORDER — GEMCITABINE CHEMO FOR BLADDER INSTILLATION 2000 MG
2000.0000 mg | Freq: Once | INTRAVENOUS | Status: DC
Start: 2023-04-05 — End: 2023-04-05
  Filled 2023-04-05: qty 52.6

## 2023-04-05 MED ORDER — STERILE WATER FOR IRRIGATION IR SOLN
Status: DC | PRN
  Administered 2023-04-05: 1000 mL

## 2023-04-05 MED ORDER — OXYCODONE HCL 5 MG PO TABS
ORAL_TABLET | ORAL | Status: AC
  Filled 2023-04-05: qty 1

## 2023-04-05 SURGICAL SUPPLY — 23 items
BAG DRAIN SIEMENS DORNER NS (MISCELLANEOUS) ×1 IMPLANT
BAG URINE DRAIN 2000ML AR STRL (UROLOGICAL SUPPLIES) ×1 IMPLANT
CATH FOLEY 2WAY 18X30 (CATHETERS) IMPLANT
CATH FOLEY 2WAY SIL 18X30 (CATHETERS) ×1 IMPLANT
DRAPE UTILITY 15X26 TOWEL STRL (DRAPES) ×1 IMPLANT
DRSG TELFA 3X4 N-ADH STERILE (GAUZE/BANDAGES/DRESSINGS) ×1 IMPLANT
ELECT LOOP 22F BIPOLAR SML (ELECTROSURGICAL) ×1 IMPLANT
ELECT REM PT RETURN 9FT ADLT (ELECTROSURGICAL) IMPLANT
ELECTRODE LOOP 22F BIPOLAR SML (ELECTROSURGICAL) IMPLANT
ELECTRODE REM PT RTRN 9FT ADLT (ELECTROSURGICAL) IMPLANT
GLOVE BIOGEL PI IND STRL 7.5 (GLOVE) ×1 IMPLANT
GOWN STRL REUS W/ TWL LRG LVL3 (GOWN DISPOSABLE) ×1 IMPLANT
GOWN STRL REUS W/TWL XL LVL4 (GOWN DISPOSABLE) ×1 IMPLANT
HOLDER FOLEY CATH W/STRAP (MISCELLANEOUS) IMPLANT
IV NS IRRIG 3000ML ARTHROMATIC (IV SOLUTION) ×2 IMPLANT
KIT TURNOVER CYSTO (KITS) ×1 IMPLANT
LOOP CUT BIPOLAR 24F LRG (ELECTROSURGICAL) IMPLANT
PACK CYSTO AR (MISCELLANEOUS) ×1 IMPLANT
SET IRRIG Y TYPE TUR BLADDER L (SET/KITS/TRAYS/PACK) ×1 IMPLANT
SURGILUBE 2OZ TUBE FLIPTOP (MISCELLANEOUS) ×1 IMPLANT
SYR TOOMEY IRRIG 70ML (MISCELLANEOUS) ×1 IMPLANT
SYRINGE TOOMEY IRRIG 70ML (MISCELLANEOUS) ×1 IMPLANT
WATER STERILE IRR 500ML POUR (IV SOLUTION) ×1 IMPLANT

## 2023-04-05 NOTE — Transfer of Care (Addendum)
 Immediate Anesthesia Transfer of Care Note  Patient: Casey French  Procedure(s) Performed: TURBT (TRANSURETHRAL RESECTION OF BLADDER TUMOR) (Bladder) BLADDER INSTILLATION OF GEMCITABINE (Bladder)  Patient Location: PACU  Anesthesia Type:General  Level of Consciousness: drowsy and patient cooperative  Airway & Oxygen Therapy: Patient Spontanous Breathing and Patient connected to face mask oxygen  Post-op Assessment: Report given to RN and Post -op Vital signs reviewed and stable  Post vital signs: Reviewed and stable  Last Vitals:  Vitals Value Taken Time  BP 140/83 04/05/23 0916  Temp    Pulse 80 04/05/23 0917  Resp 15 04/05/23 0917  SpO2 98 % 04/05/23 0917  Vitals shown include unfiled device data.  Last Pain:  Vitals:   04/05/23 0640  TempSrc: Oral  PainSc: 0-No pain         Complications: No notable events documented.

## 2023-04-05 NOTE — Discharge Instructions (Signed)
 Transurethral Resection of Bladder Tumor (TURBT) or Bladder Biopsy   Definition:  Transurethral Resection of the Bladder Tumor is a surgical procedure used to diagnose and remove tumors within the bladder. T  General instructions:     Your recent bladder surgery requires very little post hospital care but some definite precautions.  Despite the fact that no skin incisions were used, the area around the bladder incisions are raw and covered with scabs to promote healing and prevent bleeding. Certain precautions are needed to insure that the scabs are not disturbed over the next 2-4 weeks while the healing proceeds.  Because the raw surface inside your bladder and the irritating effects of urine you may expect frequency of urination and/or urgency (a stronger desire to urinate) and perhaps even getting up at night more often. This will usually resolve or improve slowly over the healing period. You may see some blood in your urine over the first 6 weeks. Do not be alarmed, even if the urine was clear for a while. Get off your feet and drink lots of fluids until clearing occurs. If you start to pass clots or don't improve call us.  Diet:  You may return to your normal diet immediately. Because of the raw surface of your bladder, alcohol, spicy foods, foods high in acid and drinks with caffeine may cause irritation or frequency and should be used in moderation. To keep your urine flowing freely and avoid constipation, drink plenty of fluids during the day (8-10 glasses). Tip: Avoid cranberry juice because it is very acidic.  Activity:  Your physical activity doesn't need to be restricted. However, if you are very active, you may see some blood in the urine. We suggest that you reduce your activity under the circumstances until the bleeding has stopped.  Bowels:  It is important to keep your bowels regular during the postoperative period. Straining with bowel movements can cause bleeding. A bowel  movement every other day is reasonable. Use a mild laxative if needed, such as milk of magnesia 2-3 tablespoons, or 2 Dulcolax tablets. Call if you continue to have problems. If you had been taking narcotics for pain, before, during or after your surgery, you may be constipated. Take a laxative if necessary.    Medication:  You should resume your pre-surgery medications unless told not to. In addition you may be given an antibiotic to prevent or treat infection. Antibiotics are not always necessary. All medication should be taken as prescribed until the bottles are finished unless you are having an unusual reaction to one of the drugs.  A prescription for pain medication was sent to your pharmacy if needed   [facility] [address], [address] [place], [place] [address] [phone]

## 2023-04-05 NOTE — Anesthesia Procedure Notes (Addendum)
 Procedure Name: LMA Insertion Date/Time: 04/05/2023 8:29 AM  Performed by: Otho Perl, CRNAPre-anesthesia Checklist: Patient identified, Patient being monitored, Timeout performed, Emergency Drugs available and Suction available Patient Re-evaluated:Patient Re-evaluated prior to induction Oxygen Delivery Method: Circle system utilized Preoxygenation: Pre-oxygenation with 100% oxygen Induction Type: IV induction Ventilation: Mask ventilation without difficulty LMA: LMA inserted LMA Size: 4.0 Tube type: Oral Number of attempts: 1 Placement Confirmation: positive ETCO2 and breath sounds checked- equal and bilateral Tube secured with: Tape Dental Injury: Teeth and Oropharynx as per pre-operative assessment

## 2023-04-05 NOTE — Op Note (Signed)
   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 Jariah Tarkowski, M.D.  Anesthesia: General  Complications: None  Intraoperative findings:  Cystoscopy: Urethra normal in caliber without stricture.  Prostate moderate lateral lobe large amount with elevated bladder neck.  Area papillary tumor mid posterior wall measured at 2 cm.  Additional adjacent papillary tumor just superior to this area measuring ~ 1 cm.  UOs normal-appearing bilaterally  EBL: Minimal  Specimens: Bladder tumor Base of bladder tumor      Indication: Casey French is a 73 y.o. male with a history of microhematuria and found to have bladder tumor on surveillance cystoscopy.  CT urogram showed no upper tract abnormalities.  After reviewing the management options for treatment, he elected to proceed with the above surgical procedure(s). We have discussed the potential benefits and risks of the procedure, side effects of the proposed treatment, the likelihood of the patient achieving the goals of the procedure, and any potential problems that might occur during the procedure or recuperation. Informed consent has been obtained.  Description of procedure:  The patient was taken to the operating room and general anesthesia was induced.  The patient was placed in the dorsal lithotomy position, prepped and draped in the usual sterile fashion, and preoperative antibiotics were administered. A preoperative time-out was performed.   The urethral meatus would not except a 26 French continuous-flow cystoscope sheath and the distal urethra was dilated with [employer] sounds from 20-28 [place].  A 24 French continuous-flow resectoscope sheath with obturator was duplicated and easily advanced into the bladder.  The obturator was replaced with an Wandra Scot resectoscope  with loop and cystoscopy was performed with findings as described above.    Using bipolar current the entire tumors were resected down to superficial muscle.  Hemostasis was obtained with cautery.  Cold biopsies of the tumor base were then obtained and sent separately.  The Iglesias resectoscope was replaced and hemostasis was obtained and noted to be excellent.  Repeat cystoscopy was performed and no other mucosal abnormalities were identified.  The bladder was then emptied and an 62F Foley catheter was placed with catheter guide.  After anesthetic reversal he was transported to the PACU in stable condition.  2000 mg of intravesical gemcitabine was instilled to the bladder.  This was allowed to dwell in the PACU for 1 hour.  This was well-tolerated.  After 1 hour, the catheter was drained and removed.  Plan: Will discharge home with an indwelling Foley with office follow-up 04/07/2023 for catheter removal He will be notified with the pathology results   Thang Flett C. Lonna Cobb,  MD

## 2023-04-05 NOTE — Anesthesia Preprocedure Evaluation (Addendum)
 Anesthesia Evaluation  Patient identified by MRN, date of birth, ID band Patient awake    Reviewed: Allergy & Precautions, H&P , NPO status , Patient's Chart, lab work & pertinent test results, reviewed documented beta blocker date and time   Airway Mallampati: II  TM Distance: >3 FB Neck ROM: full    Dental no notable dental hx.    Pulmonary former smoker   Pulmonary exam normal        Cardiovascular Exercise Tolerance: Poor hypertension, Pt. on home beta blockers and Pt. on medications (-) angina + CAD, + Past MI (2003) and + Cardiac Stents (PCI with Cypher stent to the RCA,  3.5 x 12 mm Xience Alpine stent mid LAD at [facility] 07/21/2015)  + dysrhythmias Atrial Fibrillation    The patient underwent ETT Myoview on 04/02/2021, exercising for 4 minutes and 25 seconds on a Bruce protocol without chest pain. Gated scintigraphy revealed normal left ventricular function with LVEF 57%. SPECT imaging revealed a mild inferior ischemia, consistent with patient's known coronary anatomy, and similar to previous Myoview in 2022.  ECHO 9/24:  1. Left ventricular ejection fraction, by estimation, is 60 to 65%. The  left ventricle has normal function. The left ventricle has no regional  wall motion abnormalities. Left ventricular diastolic parameters are  consistent with Grade I diastolic  dysfunction (impaired relaxation).   2. Right ventricular systolic function is normal. The right ventricular  size is normal.   3. The mitral valve is normal in structure. Trivial mitral valve  regurgitation. No evidence of mitral stenosis.   4. The aortic valve is normal in structure. Aortic valve regurgitation is  not visualized. No aortic stenosis is present.   5. The inferior vena cava is normal in size with greater than 50%  respiratory variability, suggesting right atrial pressure of 3 mmHg.     Neuro/Psych CVA (Lacunar infarction [facility])     Comment:  a.) CT  head 02/11/2007: lacunar infarction involving               thalamus and external capsule region on the RIGHT)  negative psych ROS   GI/Hepatic Neg liver ROS,GERD  Medicated and Controlled,,  Endo/Other  diabetes, Type 2    Renal/GU Renal Insufficiency and DialysisRenal disease     Musculoskeletal   Abdominal   Peds  Hematology  (+) Blood dyscrasia, anemia   Anesthesia Other Findings Past Medical History: 06/14/2014: Acute diverticulitis 2003: Acute ST elevation myocardial infarction (STEMI) of inferior  wall (HCC)     Comment:  a.) PCI with Cypher stent to the RCA No date: Anemia of chronic renal failure No date: Aortic atherosclerosis (HCC) No date: Bladder tumor 06/2022: CAP (community acquired pneumonia) No date: Cerebral microvascular disease 2003: Coronary artery disease involving native coronary artery of  native heart without angina pectoris     Comment:  a.) PCI with Cypher stent to the RCA 2003; b.) LHC               08/2003: 100% dRCA, 60% pLAD - med mgmt; c.) LHC/PCI               08/21/2015: occluded RCA at site of prior stent               placement, 70% mLAD (FFR 0.79; 3.5 x 12 mm Xience DES),               50% dLAD (FFR 0.70); d.) MV 11/11/2016: no ischemia No date: Depression No  date: Diverticulosis No date: End-stage renal disease on peritoneal dialysis (HCC) No date: GERD (gastroesophageal reflux disease) 05/15/2019: GI bleed No date: Gout No date: Heart murmur 05/15/2019: Hematemesis No date: Hepatic cyst No date: Hepatomegaly No date: Hyperlipidemia No date: Hypertension 02/11/2007: Lacunar infarction [facility])     Comment:  a.) CT head 02/11/2007: lacunar infarction involving               thalamus and external capsule region on the RIGHT; b.)               MRI brain 02/03/2020: multiple remote lacunar infarcts               involving the RIGHT basal ganglia, RIGHT thalamus, and               LEFT middle cerebellar peduncle No date: Long-term use  of aspirin therapy 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 No date: On apixaban therapy 09/2022: Pancolitis (HCC) No date: Paroxysmal atrial fibrillation (HCC)     Comment:  a.) CHA2DS2VASc = 6 (age, HTN, CVA x 2, prior               MI/vascular disease, T2DM) as of 03/11/2023; b.)               rate/rhythm maintained on oral metoprolol succinate;               chronically anticoagulated with dose reduced apixaban No date: Renal cyst, right 03/2022: Sigmoid diverticulitis No date: T2DM (type 2 diabetes mellitus) (HCC) 03/24/2013: Traumatic skull fracture and resulting subarachnoid  hemorrhage     Comment:  a.) MOI was slipping and falling on ice No date: Unstable angina [facility])  Past Surgical History: 08/13/2020: CAPD INSERTION; N/A     Comment:  Procedure: LAPAROSCOPIC INSERTION CONTINUOUS AMBULATORY               PERITONEAL DIALYSIS  (CAPD) CATHETER;  Surgeon:               Campbell Lerner, MD;  Location: [facility] ORS;  Service:               General;  Laterality: N/A; No date: CARDIAC CATHETERIZATION 07/05/2019: CATARACT EXTRACTION W/PHACO; Right     Comment:  Procedure: CATARACT EXTRACTION PHACO AND INTRAOCULAR               LENS PLACEMENT (IOC) RIGHT DIABETIC;  Surgeon: Galen Manila, MD;  Location: [facility] ORS;  Service:               Ophthalmology;  Laterality: Right;                US00:59.3 CDE8.41 LOT [id number] h 04/01/2017: COLONOSCOPY WITH PROPOFOL; N/A     Comment:  Procedure: COLONOSCOPY WITH PROPOFOL;  Surgeon:               Christena Deem, MD;  Location: [facility] ENDOSCOPY;                Service: Endoscopy;  Laterality: N/A; 10/2001: CORONARY ANGIOPLASTY WITH STENT PLACEMENT     Comment:  RCA 08/21/2015: CORONARY ANGIOPLASTY WITH STENT PLACEMENT; Left     Comment:  Procedure: CORONARY ANGIOPLASTY WITH STENT PLACEMENT               (mid LAD); Location: [facility]; Surgeon: Willene Hatchet, MD 2015: HAND SURGERY     Comment:  3 screws in right hand  04/22/2020: TOTAL HIP  ARTHROPLASTY; Left     Comment:  Procedure: TOTAL HIP ARTHROPLASTY ANTERIOR APPROACH;                Surgeon: Kennedy Bucker, MD;  Location: [facility] ORS;                Service: Orthopedics;  Laterality: Left;  BMI    Body Mass Index: 19.35 kg/m      Reproductive/Obstetrics negative OB ROS                              Anesthesia Physical Anesthesia Plan  ASA: 3  Anesthesia Plan: General LMA and General   Post-op Pain Management: Ofirmev IV (intra-op)* and Precedex   Induction: Intravenous  PONV Risk Score and Plan: Dexamethasone, Ondansetron, Midazolam and Treatment may vary due to age or medical condition  Airway Management Planned: LMA  Additional Equipment:   Intra-op Plan:   Post-operative Plan: Extubation in OR  Informed Consent: I have reviewed the patients History and Physical, chart, labs and discussed the procedure including the risks, benefits and alternatives for the proposed anesthesia with the patient or authorized representative who has indicated his/her understanding and acceptance.     Dental Advisory Given  Plan Discussed with: Anesthesiologist, CRNA and Surgeon  Anesthesia Plan Comments: (Patient consented for risks of anesthesia including but not limited to:  - adverse reactions to medications - damage to eyes, teeth, lips or other oral mucosa - nerve damage due to positioning  - sore throat or hoarseness - Damage to heart, brain, nerves, lungs, other parts of body or loss of life  Patient voiced understanding and assent.)         Anesthesia Quick Evaluation

## 2023-04-05 NOTE — H&P (Signed)
 Urology H&P   History of Present Illness: Casey French is a 73 y.o. male with a history of microhematuria and cystoscopy showing area of flat papillary tumor posterior wall measuring 2-3 cm and a 5 mm adjacent papillary tumor.  He was scheduled for TURBT last month however was canceled by anesthesia the day of the procedure due to diaphoresis, nausea and vomiting.  He has no complaints today  Past Medical History:  Diagnosis Date   Acute diverticulitis 06/14/2014   Acute ST elevation myocardial infarction (STEMI) of inferior wall (HCC) 2003   a.) PCI with Cypher stent to the RCA   Anemia of chronic renal failure    Aortic atherosclerosis (HCC)    Bladder tumor    CAP (community acquired pneumonia) 06/2022   Cerebral microvascular disease    Coronary artery disease involving native coronary artery of native heart without angina pectoris 2003   a.) PCI with Cypher stent to the RCA 2003; b.) LHC 08/2003: 100% dRCA, 60% pLAD - med mgmt; c.) LHC/PCI 08/21/2015: occluded RCA at site of prior stent placement, 70% mLAD (FFR 0.79; 3.5 x 12 mm Xience DES), 50% dLAD (FFR 0.70); d.) MV 11/11/2016: no ischemia   Depression    Diverticulosis    End-stage renal disease on peritoneal dialysis (HCC)    GERD (gastroesophageal reflux disease)    GI bleed 05/15/2019   Gout    Heart murmur    Hematemesis 05/15/2019   Hepatic cyst    Hepatomegaly    Hyperlipidemia    Hypertension    Lacunar infarction (HCC) 02/11/2007   a.) CT head 02/11/2007: lacunar infarction involving thalamus and external capsule region on the RIGHT; b.) MRI brain 02/03/2020: multiple remote lacunar infarcts involving the RIGHT basal ganglia, RIGHT thalamus, and LEFT middle cerebellar peduncle   Long-term use of aspirin therapy    Myocardial bridge    On apixaban therapy    Pancolitis (HCC) 09/2022   Paroxysmal atrial fibrillation (HCC)    a.) CHA2DS2VASc = 6 (age, HTN, CVA x 2, prior MI/vascular disease, T2DM) as of  03/11/2023; b.) rate/rhythm maintained on oral metoprolol succinate; chronically anticoagulated with dose reduced apixaban   Renal cyst, right    Sigmoid diverticulitis 03/2022   T2DM (type 2 diabetes mellitus) (HCC)    Traumatic skull fracture and resulting subarachnoid hemorrhage 03/24/2013   a.) MOI was slipping and falling on ice   Unstable angina (HCC)     Past Surgical History:  Procedure Laterality Date   CAPD INSERTION N/A 08/13/2020   Procedure: LAPAROSCOPIC INSERTION CONTINUOUS AMBULATORY PERITONEAL DIALYSIS  (CAPD) CATHETER;  Surgeon: Campbell Lerner, MD;  Location: [facility] ORS;  Service: General;  Laterality: N/A;   CARDIAC CATHETERIZATION     CATARACT EXTRACTION W/PHACO Right 07/05/2019   Procedure: CATARACT EXTRACTION PHACO AND INTRAOCULAR LENS PLACEMENT (IOC) RIGHT DIABETIC;  Surgeon: Galen Manila, MD;  Location: [facility] ORS;  Service: Ophthalmology;  Laterality: Right;  US00:59.3 CDE8.41 LOT [id number] h   COLONOSCOPY WITH PROPOFOL N/A 04/01/2017   Procedure: COLONOSCOPY WITH PROPOFOL;  Surgeon: Christena Deem, MD;  Location: [facility] ENDOSCOPY;  Service: Endoscopy;  Laterality: N/A;   CORONARY ANGIOPLASTY WITH STENT PLACEMENT  10/2001   RCA   CORONARY ANGIOPLASTY WITH STENT PLACEMENT Left 08/21/2015   Procedure: CORONARY ANGIOPLASTY WITH STENT PLACEMENT (mid LAD); Location: [facility]; Surgeon: Willene Hatchet, MD   HAND SURGERY  2015   3 screws in right hand   TOTAL HIP ARTHROPLASTY Left 04/22/2020   Procedure: TOTAL HIP ARTHROPLASTY ANTERIOR  APPROACH;  Surgeon: Kennedy Bucker, MD;  Location: [facility] ORS;  Service: Orthopedics;  Laterality: Left;    Home Medications:  Current Meds  Medication Sig   acetaminophen (TYLENOL) 325 MG tablet Take 2 tablets (650 mg total) by mouth every 6 (six) hours as needed for mild pain (or Fever >/= 101).   amLODipine (NORVASC) 10 MG tablet Take 1 tablet (10 mg total) by mouth daily. (Patient taking differently: Take 10 mg by mouth every morning.)    folic acid (FOLVITE) 1 MG tablet Take 1 tablet (1 mg total) by mouth daily. Take 1 tablet daily   gabapentin (NEURONTIN) 300 MG capsule Take 300 mg by mouth at bedtime.   hydrocortisone (ANUSOL-HC) 2.5 % rectal cream Place 1 application rectally 2 (two) times daily as needed for hemorrhoids or anal itching.   isosorbide mononitrate (IMDUR) 60 MG 24 hr tablet Take 60 mg by mouth every morning.   losartan (COZAAR) 100 MG tablet Take 1 tablet (100 mg total) by mouth daily.   meclizine (ANTIVERT) 25 MG tablet Take 1 tablet (25 mg total) by mouth 2 (two) times daily.   methocarbamol (ROBAXIN) 500 MG tablet Take 500 mg by mouth 3 (three) times daily as needed.   metoprolol succinate (TOPROL-XL) 100 MG 24 hr tablet Take 100 mg by mouth every morning. Take with or immediately following a meal.   nitroGLYCERIN (NITROSTAT) 0.4 MG SL tablet Place 0.4 mg under the tongue every 5 (five) minutes as needed for chest pain.   omeprazole (PRILOSEC) 20 MG capsule Take 20 mg by mouth every morning.   ondansetron (ZOFRAN-ODT) 4 MG disintegrating tablet Take 1 tablet (4 mg total) by mouth every 8 (eight) hours as needed.   psyllium (METAMUCIL) 58.6 % packet Take 1 packet by mouth 2 (two) times daily.   rosuvastatin (CRESTOR) 40 MG tablet Take 40 mg by mouth every morning.    Allergies:  Allergies  Allergen Reactions   Morphine Anxiety    agitation     Family History  Problem Relation Age of Onset   Heart disease Mother    Heart disease Father    CAD Other     Social History:  reports that he quit smoking about 22 years ago. His smoking use included cigarettes. He has never used smokeless tobacco. He reports that he does not drink alcohol and does not use drugs.  ROS: No cough, shortness of breath, chest pain  Physical Exam:  Vital signs in last 24 hours: Temp:  [97.5 F (36.4 C)] 97.5 F (36.4 C) (03/11 0640) Pulse Rate:  [71] 71 (03/11 0640) Resp:  [18] 18 (03/11 0640) BP: (135)/(67) 135/67  (03/11 0640) SpO2:  [95 %] 95 % (03/11 0640) Weight:  [59.4 kg] 59.4 kg (03/11 0640) Constitutional:  Alert and oriented, No acute distress HEENT: [place] AT Cardiovascular: Regular rate and rhythm, no clubbing, cyanosis, or edema. Respiratory: Normal respiratory effort, lungs clear bilaterally GU: No CVA tenderness Psychiatric: Normal mood and affect   Laboratory Data:  Recent Labs    04/05/23 0657  HGB 8.5*  HCT 25.0*   Recent Labs    04/05/23 0657  NA 135  K 4.6  CL 104  GLUCOSE 83  BUN 36*  CREATININE 7.20*   No results for input(s): "LABPT", "INR" in the last 72 hours. No results for input(s): "LABURIN" in the last 72 hours. Results for orders placed or performed in visit on 03/08/23  CULTURE, URINE COMPREHENSIVE     Status: None  Collection Time: 03/08/23  4:40 PM   Specimen: Urine   UR  Result Value Ref Range Status   Urine Culture, Comprehensive Final report  Final   Organism ID, Bacteria Comment  Final    Comment: No growth in 36 - 48 hours.  Microscopic Examination     Status: Abnormal   Collection Time: 03/08/23  4:40 PM   Urine  Result Value Ref Range Status   WBC, UA 0-5 0 - 5 /hpf Final   RBC, Urine 11-30 (A) 0 - 2 /hpf Final   Epithelial Cells (non renal) 0-10 0 - 10 /hpf Final   Casts Present (A) None seen /lpf Final   Cast Type Hyaline casts N/A Final   Mucus, UA Present (A) Not Estab. Final   Bacteria, UA Few None seen/Few Final     Radiologic Imaging: No results found.   Assessment/Plan: 73 y.o. male with papillary tumor consistent with urothelial carcinoma scheduled for TURBT The procedure has been discussed in detail including potential risks of bleeding, infection and bladder perforation.  All questions were answered and he desires to proceed   04/05/2023, 7:30 AM  Irineo Axon,  MD

## 2023-04-06 ENCOUNTER — Encounter: Payer: Self-pay | Admitting: Urology

## 2023-04-06 DIAGNOSIS — N186 End stage renal disease: Secondary | ICD-10-CM | POA: Diagnosis not present

## 2023-04-06 DIAGNOSIS — Z992 Dependence on renal dialysis: Secondary | ICD-10-CM | POA: Diagnosis not present

## 2023-04-06 LAB — SURGICAL PATHOLOGY

## 2023-04-06 NOTE — Progress Notes (Unsigned)
 04/07/2023 4:45 PM   Casey French [birth year] [id number]  Referring provider: Jerl Mina, MD [address] [place],  [place] [address]  Urological history: 1.  BPH with LUTS -PSA (10/2022) 3.7    No chief complaint on file.   HPI: Casey French is a 73 y.o. *** who presents today for ****  Previous records reviewed.   PMH: Past Medical History:  Diagnosis Date   Acute diverticulitis 06/14/2014   Acute ST elevation myocardial infarction (STEMI) of inferior wall (HCC) 2003   a.) PCI with Cypher stent to the RCA   Anemia of chronic renal failure    Aortic atherosclerosis (HCC)    Bladder tumor    CAP (community acquired pneumonia) 06/2022   Cerebral microvascular disease    Coronary artery disease involving native coronary artery of native heart without angina pectoris 2003   a.) PCI with Cypher stent to the RCA 2003; b.) LHC 08/2003: 100% dRCA, 60% pLAD - med mgmt; c.) LHC/PCI 08/21/2015: occluded RCA at site of prior stent placement, 70% mLAD (FFR 0.79; 3.5 x 12 mm Xience DES), 50% dLAD (FFR 0.70); d.) MV 11/11/2016: no ischemia   Depression    Diverticulosis    End-stage renal disease on peritoneal dialysis (HCC)    GERD (gastroesophageal reflux disease)    GI bleed 05/15/2019   Gout    Heart murmur    Hematemesis 05/15/2019   Hepatic cyst    Hepatomegaly    Hyperlipidemia    Hypertension    Lacunar infarction (HCC) 02/11/2007   a.) CT head 02/11/2007: lacunar infarction involving thalamus and external capsule region on the RIGHT; b.) MRI brain 02/03/2020: multiple remote lacunar infarcts involving the RIGHT basal ganglia, RIGHT thalamus, and LEFT middle cerebellar peduncle   Long-term use of aspirin therapy    Myocardial bridge    On apixaban therapy    Pancolitis (HCC) 09/2022   Paroxysmal atrial fibrillation (HCC)    a.) CHA2DS2VASc = 6 (age, HTN, CVA x 2, prior MI/vascular disease, T2DM) as of 03/11/2023; b.)  rate/rhythm maintained on oral metoprolol succinate; chronically anticoagulated with dose reduced apixaban   Renal cyst, right    Sigmoid diverticulitis 03/2022   T2DM (type 2 diabetes mellitus) (HCC)    Traumatic skull fracture and resulting subarachnoid hemorrhage 03/24/2013   a.) MOI was slipping and falling on ice   Unstable angina (HCC)     Surgical History: Past Surgical History:  Procedure Laterality Date   BLADDER INSTILLATION N/A 04/05/2023   Procedure: BLADDER INSTILLATION OF GEMCITABINE;  Surgeon: Riki Altes, MD;  Location: [facility];  Service: Urology;  Laterality: N/A;   CAPD INSERTION N/A 08/13/2020   Procedure: LAPAROSCOPIC INSERTION CONTINUOUS AMBULATORY PERITONEAL DIALYSIS  (CAPD) CATHETER;  Surgeon: Campbell Lerner, MD;  Location: [facility];  Service: General;  Laterality: N/A;   CARDIAC CATHETERIZATION     CATARACT EXTRACTION W/PHACO Right 07/05/2019   Procedure: CATARACT EXTRACTION PHACO AND INTRAOCULAR LENS PLACEMENT (IOC) RIGHT DIABETIC;  Surgeon: Galen Manila, MD;  Location: [facility];  Service: Ophthalmology;  Laterality: Right;  US00:59.3 CDE8.41 LOT [id number] h   COLONOSCOPY WITH PROPOFOL N/A 04/01/2017   Procedure: COLONOSCOPY WITH PROPOFOL;  Surgeon: Christena Deem, MD;  Location: [facility];  Service: Endoscopy;  Laterality: N/A;   CORONARY ANGIOPLASTY WITH STENT PLACEMENT  10/2001   RCA   CORONARY ANGIOPLASTY WITH STENT PLACEMENT Left 08/21/2015   Procedure: CORONARY ANGIOPLASTY WITH STENT PLACEMENT (mid LAD); Location: [facility]; Surgeon: Greggory Stallion  Stouffer, MD   HAND SURGERY  2015   3 screws in right hand   TOTAL HIP ARTHROPLASTY Left 04/22/2020   Procedure: TOTAL HIP ARTHROPLASTY ANTERIOR APPROACH;  Surgeon: Kennedy Bucker, MD;  Location: [facility];  Service: Orthopedics;  Laterality: Left;   TRANSURETHRAL RESECTION OF BLADDER TUMOR N/A 04/05/2023   Procedure: TURBT (TRANSURETHRAL RESECTION OF BLADDER TUMOR);  Surgeon: Riki Altes, MD;  Location:  [facility];  Service: Urology;  Laterality: N/A;    Home Medications:  Allergies as of 04/07/2023       Reactions   Morphine Anxiety   agitation        Medication List        Accurate as of April 06, 2023  4:45 PM. If you have any questions, ask your nurse or doctor.          acetaminophen 325 MG tablet Commonly known as: TYLENOL Take 2 tablets (650 mg total) by mouth every 6 (six) hours as needed for mild pain (or Fever >/= 101).   allopurinol 100 MG tablet Commonly known as: ZYLOPRIM Take 100 mg by mouth daily.   amLODipine 10 MG tablet Commonly known as: NORVASC Take 1 tablet (10 mg total) by mouth daily. What changed: when to take this   apixaban 2.5 MG Tabs tablet Commonly known as: ELIQUIS Take 2.5 mg by mouth 2 (two) times daily.   aspirin EC 81 MG tablet Take 81 mg by mouth daily.   calcitRIOL 0.25 MCG capsule Commonly known as: ROCALTROL Take 1 capsule (0.25 mcg total) by mouth daily.   escitalopram 10 MG tablet Commonly known as: LEXAPRO Take 10 mg by mouth every morning.   folic acid 1 MG tablet Commonly known as: FOLVITE Take 1 tablet (1 mg total) by mouth daily. Take 1 tablet daily   gabapentin 300 MG capsule Commonly known as: NEURONTIN Take 300 mg by mouth at bedtime.   HYDROcodone-acetaminophen 5-325 MG tablet Commonly known as: NORCO/VICODIN Take 1 tablet by mouth every 6 (six) hours as needed for moderate pain (pain score 4-6).   hydrocortisone 2.5 % rectal cream Commonly known as: ANUSOL-HC Place 1 application rectally 2 (two) times daily as needed for hemorrhoids or anal itching.   isosorbide mononitrate 60 MG 24 hr tablet Commonly known as: IMDUR Take 60 mg by mouth every morning.   losartan 100 MG tablet Commonly known as: COZAAR Take 1 tablet (100 mg total) by mouth daily.   meclizine 25 MG tablet Commonly known as: ANTIVERT Take 1 tablet (25 mg total) by mouth 2 (two) times daily.   methocarbamol 500 MG  tablet Commonly known as: ROBAXIN Take 500 mg by mouth 3 (three) times daily as needed.   metoprolol succinate 100 MG 24 hr tablet Commonly known as: TOPROL-XL Take 100 mg by mouth every morning. Take with or immediately following a meal.   nitroGLYCERIN 0.4 MG SL tablet Commonly known as: NITROSTAT Place 0.4 mg under the tongue every 5 (five) minutes as needed for chest pain.   omeprazole 20 MG capsule Commonly known as: PRILOSEC Take 20 mg by mouth every morning.   ondansetron 4 MG disintegrating tablet Commonly known as: ZOFRAN-ODT Take 1 tablet (4 mg total) by mouth every 8 (eight) hours as needed.   psyllium 58.6 % packet Commonly known as: METAMUCIL Take 1 packet by mouth 2 (two) times daily.   rosuvastatin 40 MG tablet Commonly known as: CRESTOR Take 40 mg by mouth every morning.   Vitamin D (Ergocalciferol)  1.25 MG (50000 UNIT) Caps capsule Commonly known as: DRISDOL Take 50,000 Units by mouth once a week.        Allergies:  Allergies  Allergen Reactions   Morphine Anxiety    agitation     Family History: Family History  Problem Relation Age of Onset   Heart disease Mother    Heart disease Father    CAD Other     Social History:  reports that he quit smoking about 22 years ago. His smoking use included cigarettes. He has never used smokeless tobacco. He reports that he does not drink alcohol and does not use drugs.  ROS: Pertinent ROS in HPI  Physical Exam: There were no vitals taken for this visit.  Constitutional:  Well nourished. Alert and oriented, No acute distress. HEENT: [place] AT, moist mucus membranes.  Trachea midline, no masses. Cardiovascular: No clubbing, cyanosis, or edema. Respiratory: Normal respiratory effort, no increased work of breathing. GI: Abdomen is soft, non tender, non distended, no abdominal masses. Liver and spleen not palpable.  No hernias appreciated.  Stool sample for occult testing is not indicated.   GU: No CVA  tenderness.  No bladder fullness or masses.  Patient with circumcised/uncircumcised phallus. ***Foreskin easily retracted***  Urethral meatus is patent.  No penile discharge. No penile lesions or rashes. Scrotum without lesions, cysts, rashes and/or edema.  Testicles are located scrotally bilaterally. No masses are appreciated in the testicles. Left and right epididymis are normal. Rectal: Patient with  normal sphincter tone. Anus and perineum without scarring or rashes. No rectal masses are appreciated. Prostate is approximately *** grams, *** nodules are appreciated. Seminal vesicles are normal. Skin: No rashes, bruises or suspicious lesions. Lymph: No cervical or inguinal adenopathy. Neurologic: Grossly intact, no focal deficits, moving all 4 extremities. Psychiatric: Normal mood and affect.  Laboratory Data: Lab Results  Component Value Date   WBC 10.6 (H) 03/15/2023   HGB 8.5 (L) 04/05/2023   HCT 25.0 (L) 04/05/2023   MCV 97.3 03/15/2023   PLT 233 03/15/2023    Lab Results  Component Value Date   CREATININE 7.20 (H) 04/05/2023    No results found for: "PSA"  No results found for: "TESTOSTERONE"  Lab Results  Component Value Date   HGBA1C 5.4 04/18/2022    Lab Results  Component Value Date   TSH 2.113 06/18/2020       Component Value Date/Time   CHOL 142 11/10/2016 2103   CHOL 194 09/27/2012 0113   HDL 45 11/10/2016 2103   HDL 49 09/27/2012 0113   CHOLHDL 3.2 11/10/2016 2103   VLDL 22 11/10/2016 2103   VLDL 47 (H) 09/27/2012 0113   LDLCALC 75 11/10/2016 2103   LDLCALC 98 09/27/2012 0113    Lab Results  Component Value Date   AST 17 03/15/2023   Lab Results  Component Value Date   ALT 14 03/15/2023   No components found for: "ALKALINEPHOPHATASE" No components found for: "BILIRUBINTOTAL"  No results found for: "ESTRADIOL"  Urinalysis See EPIC and HPI  I have reviewed the labs.   Pertinent Imaging: @CT @ @ultrasound @ @KUB @ I have independently  reviewed the films.    Assessment & Plan:  ***  There are no diagnoses linked to this encounter.  No follow-ups on file.  These notes generated with voice recognition software. I apologize for typographical errors.  Cloretta Ned  [facility] [address]  [address] [place], [place] [address] [phone]

## 2023-04-06 NOTE — Anesthesia Postprocedure Evaluation (Signed)
 Anesthesia Post Note  Patient: Casey French  Procedure(s) Performed: TURBT (TRANSURETHRAL RESECTION OF BLADDER TUMOR) (Bladder) BLADDER INSTILLATION OF GEMCITABINE (Bladder)  Patient location during evaluation: PACU Anesthesia Type: General Level of consciousness: awake and alert Pain management: pain level controlled Vital Signs Assessment: post-procedure vital signs reviewed and stable Respiratory status: spontaneous breathing, nonlabored ventilation and respiratory function stable Cardiovascular status: blood pressure returned to baseline and stable Postop Assessment: no apparent nausea or vomiting Anesthetic complications: no   No notable events documented.   Last Vitals:  Vitals:   04/05/23 1000 04/05/23 1023  BP: 127/77 (!) 162/72  Pulse: 79 73  Resp: (!) 22 16  Temp:  (!) 36.4 C  SpO2: 94% 95%    Last Pain:  Vitals:   04/05/23 1035  TempSrc:   PainSc: 3                  Foye Deer

## 2023-04-07 ENCOUNTER — Encounter: Payer: Self-pay | Admitting: Urology

## 2023-04-07 ENCOUNTER — Ambulatory Visit (INDEPENDENT_AMBULATORY_CARE_PROVIDER_SITE_OTHER): Admitting: Urology

## 2023-04-07 VITALS — BP 168/87 | HR 91 | Ht 69.0 in | Wt 133.0 lb

## 2023-04-07 DIAGNOSIS — Z992 Dependence on renal dialysis: Secondary | ICD-10-CM | POA: Diagnosis not present

## 2023-04-07 DIAGNOSIS — C674 Malignant neoplasm of posterior wall of bladder: Secondary | ICD-10-CM

## 2023-04-07 DIAGNOSIS — N186 End stage renal disease: Secondary | ICD-10-CM | POA: Diagnosis not present

## 2023-04-07 NOTE — Patient Instructions (Signed)

## 2023-04-08 DIAGNOSIS — Z992 Dependence on renal dialysis: Secondary | ICD-10-CM | POA: Diagnosis not present

## 2023-04-08 DIAGNOSIS — N186 End stage renal disease: Secondary | ICD-10-CM | POA: Diagnosis not present

## 2023-04-09 DIAGNOSIS — Z992 Dependence on renal dialysis: Secondary | ICD-10-CM | POA: Diagnosis not present

## 2023-04-09 DIAGNOSIS — N186 End stage renal disease: Secondary | ICD-10-CM | POA: Diagnosis not present

## 2023-04-12 DIAGNOSIS — Z992 Dependence on renal dialysis: Secondary | ICD-10-CM | POA: Diagnosis not present

## 2023-04-12 DIAGNOSIS — N186 End stage renal disease: Secondary | ICD-10-CM | POA: Diagnosis not present

## 2023-04-13 DIAGNOSIS — N186 End stage renal disease: Secondary | ICD-10-CM | POA: Diagnosis not present

## 2023-04-13 DIAGNOSIS — Z992 Dependence on renal dialysis: Secondary | ICD-10-CM | POA: Diagnosis not present

## 2023-04-14 DIAGNOSIS — N186 End stage renal disease: Secondary | ICD-10-CM | POA: Diagnosis not present

## 2023-04-14 DIAGNOSIS — Z992 Dependence on renal dialysis: Secondary | ICD-10-CM | POA: Diagnosis not present

## 2023-04-15 DIAGNOSIS — Z0100 Encounter for examination of eyes and vision without abnormal findings: Secondary | ICD-10-CM | POA: Diagnosis not present

## 2023-04-15 DIAGNOSIS — H2512 Age-related nuclear cataract, left eye: Secondary | ICD-10-CM | POA: Diagnosis not present

## 2023-04-15 DIAGNOSIS — H34831 Tributary (branch) retinal vein occlusion, right eye, with macular edema: Secondary | ICD-10-CM | POA: Diagnosis not present

## 2023-04-15 DIAGNOSIS — N186 End stage renal disease: Secondary | ICD-10-CM | POA: Diagnosis not present

## 2023-04-15 DIAGNOSIS — H353131 Nonexudative age-related macular degeneration, bilateral, early dry stage: Secondary | ICD-10-CM | POA: Diagnosis not present

## 2023-04-15 DIAGNOSIS — E119 Type 2 diabetes mellitus without complications: Secondary | ICD-10-CM | POA: Diagnosis not present

## 2023-04-15 DIAGNOSIS — Z992 Dependence on renal dialysis: Secondary | ICD-10-CM | POA: Diagnosis not present

## 2023-04-16 ENCOUNTER — Other Ambulatory Visit: Payer: Self-pay

## 2023-04-16 ENCOUNTER — Encounter: Payer: Self-pay | Admitting: Emergency Medicine

## 2023-04-16 ENCOUNTER — Emergency Department

## 2023-04-16 ENCOUNTER — Emergency Department
Admission: EM | Admit: 2023-04-16 | Discharge: 2023-04-16 | Disposition: A | Discharge status: Home / Self Care | Attending: Emergency Medicine | Admitting: Emergency Medicine

## 2023-04-16 DIAGNOSIS — Z992 Dependence on renal dialysis: Secondary | ICD-10-CM | POA: Diagnosis not present

## 2023-04-16 DIAGNOSIS — C679 Malignant neoplasm of bladder, unspecified: Secondary | ICD-10-CM | POA: Diagnosis not present

## 2023-04-16 DIAGNOSIS — R310 Gross hematuria: Secondary | ICD-10-CM | POA: Insufficient documentation

## 2023-04-16 DIAGNOSIS — R1030 Lower abdominal pain, unspecified: Secondary | ICD-10-CM | POA: Insufficient documentation

## 2023-04-16 DIAGNOSIS — N186 End stage renal disease: Secondary | ICD-10-CM | POA: Diagnosis not present

## 2023-04-16 DIAGNOSIS — R319 Hematuria, unspecified: Secondary | ICD-10-CM | POA: Diagnosis present

## 2023-04-16 DIAGNOSIS — R188 Other ascites: Secondary | ICD-10-CM | POA: Diagnosis not present

## 2023-04-16 DIAGNOSIS — K5730 Diverticulosis of large intestine without perforation or abscess without bleeding: Secondary | ICD-10-CM | POA: Diagnosis not present

## 2023-04-16 LAB — CBC
HCT: 27.4 % — ABNORMAL LOW (ref 39.0–52.0)
Hemoglobin: 8.9 g/dL — ABNORMAL LOW (ref 13.0–17.0)
MCH: 30.9 pg (ref 26.0–34.0)
MCHC: 32.5 g/dL (ref 30.0–36.0)
MCV: 95.1 fL (ref 80.0–100.0)
Platelets: 248 10*3/uL (ref 150–400)
RBC: 2.88 MIL/uL — ABNORMAL LOW (ref 4.22–5.81)
RDW: 13.1 % (ref 11.5–15.5)
WBC: 7.4 10*3/uL (ref 4.0–10.5)
nRBC: 0.7 % — ABNORMAL HIGH (ref 0.0–0.2)

## 2023-04-16 LAB — URINALYSIS, ROUTINE W REFLEX MICROSCOPIC
Bilirubin Urine: NEGATIVE
Glucose, UA: >=500 mg/dL — AB
Ketones, ur: NEGATIVE mg/dL
Leukocytes,Ua: NEGATIVE
Nitrite: NEGATIVE
Protein, ur: >=300 mg/dL — AB
Specific Gravity, Urine: 1.012 (ref 1.005–1.030)
pH: 8 (ref 5.0–8.0)

## 2023-04-16 LAB — LIPASE, BLOOD: Lipase: 84 U/L — ABNORMAL HIGH (ref 11–51)

## 2023-04-16 LAB — COMPREHENSIVE METABOLIC PANEL
ALT: 10 U/L (ref 0–44)
AST: 22 U/L (ref 15–41)
Albumin: 2.6 g/dL — ABNORMAL LOW (ref 3.5–5.0)
Alkaline Phosphatase: 97 U/L (ref 38–126)
Anion gap: 15 (ref 5–15)
BUN: 57 mg/dL — ABNORMAL HIGH (ref 8–23)
CO2: 17 mmol/L — ABNORMAL LOW (ref 22–32)
Calcium: 8.1 mg/dL — ABNORMAL LOW (ref 8.9–10.3)
Chloride: 104 mmol/L (ref 98–111)
Creatinine, Ser: 7.11 mg/dL — ABNORMAL HIGH (ref 0.61–1.24)
GFR, Estimated: 8 mL/min — ABNORMAL LOW (ref >60)
Glucose, Bld: 109 mg/dL — ABNORMAL HIGH (ref 70–99)
Potassium: 3.6 mmol/L (ref 3.5–5.1)
Sodium: 136 mmol/L (ref 135–145)
Total Bilirubin: 0.6 mg/dL (ref 0.0–1.2)
Total Protein: 6.7 g/dL (ref 6.5–8.1)

## 2023-04-16 MED ORDER — HYDROCODONE-ACETAMINOPHEN 5-325 MG PO TABS
1.0000 | ORAL_TABLET | Freq: Once | ORAL | Status: AC
  Administered 2023-04-16: 1 via ORAL
  Filled 2023-04-16: qty 1

## 2023-04-16 NOTE — ED Provider Notes (Signed)
 [facility] Provider Note    Event Date/Time   First MD Initiated Contact with Patient 04/16/23 1547     (approximate)   History   Hematuria   HPI  Casey French is a 73 y.o. male  with bladder cancer.  Reviewed recent notes from [facility]  Also patient end-stage renal disease on peritoneal dialysis nightly.  Reports no issues with completing his peritoneal dialysis last night  Patient urinates a few times every day.  This morning he noticed that he had blood in his urine it was very dark.  Along with that he has had pain in his lower abdomen around his "bladder".  He still able to empty it and feels like he needs to urinate now and is ready to try.  He has had no fevers no nausea or vomiting reports no abdominal pain but rather reports its pain over his bladder area.  No flank pain.     Physical Exam   Triage Vital Signs: ED Triage Vitals  Encounter Vitals Group     BP 04/16/23 1456 (!) 147/90     Systolic BP Percentile --      Diastolic BP Percentile --      Pulse Rate 04/16/23 1456 88     Resp 04/16/23 1456 20     Temp 04/16/23 1456 97.6 F (36.4 C)     Temp Source 04/16/23 1456 Oral     SpO2 04/16/23 1456 100 %     Weight --      Height --      Head Circumference --      Peak Flow --      Pain Score 04/16/23 1457 6     Pain Loc --      Pain Education --      Exclude from Growth Chart --     Most recent vital signs: Vitals:   04/16/23 2030 04/16/23 2100  BP: 130/81 127/66  Pulse: 80 76  Resp: (!) 22 (!) 21  Temp:    SpO2: 96% 100%     General: Awake, no distress.  CV:  Good peripheral perfusion.  Resp:  Normal effort.  Abd:  No distention.  Soft nontender nondistended except does report discomfort to palpation of suprapubic space.  There is no rebound or guarding.  There is no distention.  Peritoneal dialysis catheter in the left lower quadrant. Other:  Normal uncircumcised penis.  Foreskin retracts without  issue.  No active gross hematuria at this time.  Normal testicles and perineum/scrotal region   ED Results / Procedures / Treatments   Labs (all labs ordered are listed, but only abnormal results are displayed) Labs Reviewed  LIPASE, BLOOD - Abnormal; Notable for the following components:      Result Value   Lipase 84 (*)    All other components within normal limits  COMPREHENSIVE METABOLIC PANEL - Abnormal; Notable for the following components:   CO2 17 (*)    Glucose, Bld 109 (*)    BUN 57 (*)    Creatinine, Ser 7.11 (*)    Calcium 8.1 (*)    Albumin 2.6 (*)    GFR, Estimated 8 (*)    All other components within normal limits  CBC - Abnormal; Notable for the following components:   RBC 2.88 (*)    Hemoglobin 8.9 (*)    HCT 27.4 (*)    nRBC 0.7 (*)    All other components within normal limits  URINALYSIS,  ROUTINE W REFLEX MICROSCOPIC - Abnormal; Notable for the following components:   Color, Urine AMBER (*)    APPearance HAZY (*)    Glucose, UA >=500 (*)    Hgb urine dipstick LARGE (*)    Protein, ur >=300 (*)    All other components within normal limits     RADIOLOGY  CT ABDOMEN PELVIS WO CONTRAST Result Date: 04/16/2023 CLINICAL DATA:  Flank pain hematuria EXAM: CT ABDOMEN AND PELVIS WITHOUT CONTRAST TECHNIQUE: Multidetector CT imaging of the abdomen and pelvis was performed following the standard protocol without IV contrast. RADIATION DOSE REDUCTION: This exam was performed according to the departmental dose-optimization program which includes automated exposure control, adjustment of the mA and/or kV according to patient size and/or use of iterative reconstruction technique. COMPARISON:  CT 12/15/2022 FINDINGS: Lower chest: Lung bases demonstrate no acute airspace disease. Coronary vascular calcification. Hepatobiliary: No focal liver abnormality is seen. No gallstones, gallbladder wall thickening, or biliary dilatation. Pancreas: Unremarkable. No pancreatic ductal  dilatation or surrounding inflammatory changes. Spleen: Normal in size without focal abnormality. Adrenals/Urinary Tract: Adrenal glands are normal. Kidneys are slightly atrophic. No hydronephrosis. Right renal cyst for which no imaging follow-up is recommended. Slightly thick-walled appearance of the urinary bladder. Stomach/Bowel: Stomach nonenlarged. No dilated small bowel. No acute bowel wall thickening. Negative appendix. Diverticular disease of the left colon Vascular/Lymphatic: Aortic atherosclerosis. No enlarged abdominal or pelvic lymph nodes. Reproductive: Prostate is unremarkable. Other: No free air Musculoskeletal: Peritoneal dialysis catheter coiled in the posterior pelvis. Small volume abdominal ascites, small moderate volume pelvic ascites. IMPRESSION: 1. Negative for hydronephrosis or hydroureter. No obstructing renal or ureteral calculi. 2. Slightly thick-walled appearance of the urinary bladder, nonspecific and could be infectious/inflammatory or related to provided clinical history 3. Peritoneal dialysis catheter coiled in the posterior pelvis. Small volume abdominal ascites, small to moderate volume pelvic ascites. 4. Diverticular disease of the left colon without acute inflammatory process. 5. Aortic atherosclerosis. Electronically Signed   By: Jasmine Pang M.D.   On: 04/16/2023 19:02      PROCEDURES:  Critical Care performed: No  Procedures   MEDICATIONS ORDERED IN ED: Medications  HYDROcodone-acetaminophen (NORCO/VICODIN) 5-325 MG per tablet 1 tablet (1 tablet Oral Given 04/16/23 1702)     IMPRESSION / MDM / ASSESSMENT AND PLAN / ED COURSE  I reviewed the triage vital signs and the nursing notes.                              Differential diagnosis includes, but is not limited to, possible hematuria secondary to known bladder tumor, urinary tract infection nephrolithiasis, etc.  Patient reports suprapubic pain with associated hematuria.  He does report he is able to  urinate and does not have any symptoms of urinary retention noted at this time.  Postvoid residual is less than 100 mL.  Urine is noted to be red-tinged  He is anticoagulated but does not have evidence of acute major hemorrhage.  His hemoglobin is stable with his baseline 8-1/2-9 over the last month.  Electrolytes are consistent with baseline and being on peritoneal dialysis.  There is no associated abdominal pain or abdominal symptoms or other symptoms seem to be urinary in nature with suprapubic discomfort.  I do not think this represents an acute problem with his peritoneal dialysis catheter or findings or clinical history that would suggest SBP etc.    Patient's presentation is most consistent with acute complicated illness / injury  requiring diagnostic workup.   Discussed clinical history prior urologic history end-stage renal disease peritoneal dialysis with urologist Dr. Liliane Shi.  He advises have patient discontinue his anticoagulant for 48 hours and follow-up closely with urology.  Discussed with patient his wife and his daughter at the bedside they are comfortable with this plan.  He reports that the blood in his urine seems to be clearing up at this point.  He is on anticoagulation for A-fib but also has bladder cancer.  At this point I do not see clear evidence of infection or large gross hematuria ongoing.  He is hemodynamically stable.  He is able to empty his bladder and will continue home peritoneal dialysis as well.  Return precautions and treatment recommendations and follow-up discussed with the patient who is agreeable with the plan.        FINAL CLINICAL IMPRESSION(S) / ED DIAGNOSES   Final diagnoses:  Gross hematuria     Rx / DC Orders   ED Discharge Orders     None        Note:  This document was prepared using Dragon voice recognition software and may include unintentional dictation errors.   Sharyn Creamer, MD 04/16/23 2209

## 2023-04-16 NOTE — ED Notes (Signed)
 Assumed care of patient. Family at bedside and patient resting comfortably.

## 2023-04-16 NOTE — Discharge Instructions (Signed)
 Please hold your Eliquis for the next 48 hours.  You may resume this on Tuesday.  Call urology on Monday to schedule close follow-up.  Return to the ER right away if you cannot urinate, you develop a fever, severe abdominal pain, start passing large clots or large amounts of blood from the urine, or other concerns arise.

## 2023-04-16 NOTE — ED Triage Notes (Signed)
 Pt to ED via POV from home. Pt reports hematuria and pelvic pain that started this am. Pt dx with bladder cancer and starts chemo 4/29. Pt reports nausea. Pt reports increased fatigue. Pt appears pale. Pt is on Eliquis.

## 2023-04-17 DIAGNOSIS — Z992 Dependence on renal dialysis: Secondary | ICD-10-CM | POA: Diagnosis not present

## 2023-04-17 DIAGNOSIS — N186 End stage renal disease: Secondary | ICD-10-CM | POA: Diagnosis not present

## 2023-04-18 DIAGNOSIS — Z992 Dependence on renal dialysis: Secondary | ICD-10-CM | POA: Diagnosis not present

## 2023-04-18 DIAGNOSIS — N186 End stage renal disease: Secondary | ICD-10-CM | POA: Diagnosis not present

## 2023-04-19 DIAGNOSIS — N186 End stage renal disease: Secondary | ICD-10-CM | POA: Diagnosis not present

## 2023-04-19 DIAGNOSIS — Z992 Dependence on renal dialysis: Secondary | ICD-10-CM | POA: Diagnosis not present

## 2023-04-20 DIAGNOSIS — N186 End stage renal disease: Secondary | ICD-10-CM | POA: Diagnosis not present

## 2023-04-20 DIAGNOSIS — Z992 Dependence on renal dialysis: Secondary | ICD-10-CM | POA: Diagnosis not present

## 2023-04-22 ENCOUNTER — Inpatient Hospital Stay
Admission: EM | Admit: 2023-04-22 | Discharge: 2023-04-24 | DRG: 377 | Disposition: A | Discharge status: Home / Self Care | Attending: Internal Medicine | Admitting: Internal Medicine

## 2023-04-22 ENCOUNTER — Inpatient Hospital Stay

## 2023-04-22 ENCOUNTER — Other Ambulatory Visit: Payer: Self-pay

## 2023-04-22 ENCOUNTER — Emergency Department

## 2023-04-22 DIAGNOSIS — R1030 Lower abdominal pain, unspecified: Secondary | ICD-10-CM

## 2023-04-22 DIAGNOSIS — N2581 Secondary hyperparathyroidism of renal origin: Secondary | ICD-10-CM | POA: Diagnosis not present

## 2023-04-22 DIAGNOSIS — R310 Gross hematuria: Secondary | ICD-10-CM | POA: Diagnosis present

## 2023-04-22 DIAGNOSIS — R531 Weakness: Secondary | ICD-10-CM | POA: Diagnosis not present

## 2023-04-22 DIAGNOSIS — R0602 Shortness of breath: Secondary | ICD-10-CM

## 2023-04-22 DIAGNOSIS — K2970 Gastritis, unspecified, without bleeding: Secondary | ICD-10-CM | POA: Diagnosis not present

## 2023-04-22 DIAGNOSIS — K922 Gastrointestinal hemorrhage, unspecified: Secondary | ICD-10-CM | POA: Diagnosis not present

## 2023-04-22 DIAGNOSIS — N186 End stage renal disease: Secondary | ICD-10-CM | POA: Diagnosis not present

## 2023-04-22 DIAGNOSIS — K219 Gastro-esophageal reflux disease without esophagitis: Secondary | ICD-10-CM | POA: Diagnosis present

## 2023-04-22 DIAGNOSIS — K269 Duodenal ulcer, unspecified as acute or chronic, without hemorrhage or perforation: Secondary | ICD-10-CM | POA: Diagnosis not present

## 2023-04-22 DIAGNOSIS — I482 Chronic atrial fibrillation, unspecified: Secondary | ICD-10-CM | POA: Diagnosis not present

## 2023-04-22 DIAGNOSIS — E872 Acidosis, unspecified: Secondary | ICD-10-CM | POA: Diagnosis present

## 2023-04-22 DIAGNOSIS — G8929 Other chronic pain: Secondary | ICD-10-CM | POA: Diagnosis present

## 2023-04-22 DIAGNOSIS — Z79899 Other long term (current) drug therapy: Secondary | ICD-10-CM

## 2023-04-22 DIAGNOSIS — K5730 Diverticulosis of large intestine without perforation or abscess without bleeding: Secondary | ICD-10-CM | POA: Diagnosis not present

## 2023-04-22 DIAGNOSIS — K2971 Gastritis, unspecified, with bleeding: Secondary | ICD-10-CM | POA: Diagnosis present

## 2023-04-22 DIAGNOSIS — Z992 Dependence on renal dialysis: Secondary | ICD-10-CM | POA: Diagnosis not present

## 2023-04-22 DIAGNOSIS — Z87891 Personal history of nicotine dependence: Secondary | ICD-10-CM

## 2023-04-22 DIAGNOSIS — F32A Depression, unspecified: Secondary | ICD-10-CM | POA: Diagnosis present

## 2023-04-22 DIAGNOSIS — M109 Gout, unspecified: Secondary | ICD-10-CM | POA: Diagnosis present

## 2023-04-22 DIAGNOSIS — Z7901 Long term (current) use of anticoagulants: Secondary | ICD-10-CM

## 2023-04-22 DIAGNOSIS — Z9221 Personal history of antineoplastic chemotherapy: Secondary | ICD-10-CM

## 2023-04-22 DIAGNOSIS — Z96642 Presence of left artificial hip joint: Secondary | ICD-10-CM | POA: Diagnosis present

## 2023-04-22 DIAGNOSIS — Z66 Do not resuscitate: Secondary | ICD-10-CM | POA: Diagnosis present

## 2023-04-22 DIAGNOSIS — E785 Hyperlipidemia, unspecified: Secondary | ICD-10-CM | POA: Diagnosis present

## 2023-04-22 DIAGNOSIS — D631 Anemia in chronic kidney disease: Secondary | ICD-10-CM | POA: Diagnosis present

## 2023-04-22 DIAGNOSIS — I120 Hypertensive chronic kidney disease with stage 5 chronic kidney disease or end stage renal disease: Secondary | ICD-10-CM | POA: Diagnosis not present

## 2023-04-22 DIAGNOSIS — I10 Essential (primary) hypertension: Secondary | ICD-10-CM | POA: Diagnosis not present

## 2023-04-22 DIAGNOSIS — Z681 Body mass index (BMI) 19 or less, adult: Secondary | ICD-10-CM | POA: Diagnosis not present

## 2023-04-22 DIAGNOSIS — R112 Nausea with vomiting, unspecified: Secondary | ICD-10-CM | POA: Diagnosis not present

## 2023-04-22 DIAGNOSIS — R0689 Other abnormalities of breathing: Secondary | ICD-10-CM | POA: Diagnosis not present

## 2023-04-22 DIAGNOSIS — I2510 Atherosclerotic heart disease of native coronary artery without angina pectoris: Secondary | ICD-10-CM | POA: Diagnosis present

## 2023-04-22 DIAGNOSIS — D62 Acute posthemorrhagic anemia: Secondary | ICD-10-CM | POA: Diagnosis not present

## 2023-04-22 DIAGNOSIS — Z885 Allergy status to narcotic agent status: Secondary | ICD-10-CM

## 2023-04-22 DIAGNOSIS — I700 Atherosclerosis of aorta: Secondary | ICD-10-CM | POA: Diagnosis not present

## 2023-04-22 DIAGNOSIS — R319 Hematuria, unspecified: Secondary | ICD-10-CM

## 2023-04-22 DIAGNOSIS — R634 Abnormal weight loss: Secondary | ICD-10-CM | POA: Diagnosis present

## 2023-04-22 DIAGNOSIS — I252 Old myocardial infarction: Secondary | ICD-10-CM

## 2023-04-22 DIAGNOSIS — D649 Anemia, unspecified: Secondary | ICD-10-CM | POA: Diagnosis not present

## 2023-04-22 DIAGNOSIS — K264 Chronic or unspecified duodenal ulcer with hemorrhage: Principal | ICD-10-CM | POA: Diagnosis present

## 2023-04-22 DIAGNOSIS — I480 Paroxysmal atrial fibrillation: Secondary | ICD-10-CM | POA: Diagnosis not present

## 2023-04-22 DIAGNOSIS — Z8551 Personal history of malignant neoplasm of bladder: Secondary | ICD-10-CM

## 2023-04-22 DIAGNOSIS — Z7982 Long term (current) use of aspirin: Secondary | ICD-10-CM

## 2023-04-22 DIAGNOSIS — J439 Emphysema, unspecified: Secondary | ICD-10-CM | POA: Diagnosis not present

## 2023-04-22 DIAGNOSIS — K2990 Gastroduodenitis, unspecified, without bleeding: Secondary | ICD-10-CM | POA: Diagnosis not present

## 2023-04-22 DIAGNOSIS — J9811 Atelectasis: Secondary | ICD-10-CM | POA: Diagnosis not present

## 2023-04-22 DIAGNOSIS — Z860100 Personal history of colon polyps, unspecified: Secondary | ICD-10-CM

## 2023-04-22 DIAGNOSIS — E1122 Type 2 diabetes mellitus with diabetic chronic kidney disease: Secondary | ICD-10-CM | POA: Diagnosis not present

## 2023-04-22 DIAGNOSIS — C679 Malignant neoplasm of bladder, unspecified: Secondary | ICD-10-CM | POA: Diagnosis not present

## 2023-04-22 DIAGNOSIS — Z8673 Personal history of transient ischemic attack (TIA), and cerebral infarction without residual deficits: Secondary | ICD-10-CM

## 2023-04-22 DIAGNOSIS — N281 Cyst of kidney, acquired: Secondary | ICD-10-CM | POA: Diagnosis not present

## 2023-04-22 DIAGNOSIS — Z8249 Family history of ischemic heart disease and other diseases of the circulatory system: Secondary | ICD-10-CM

## 2023-04-22 DIAGNOSIS — Z955 Presence of coronary angioplasty implant and graft: Secondary | ICD-10-CM

## 2023-04-22 DIAGNOSIS — R1084 Generalized abdominal pain: Secondary | ICD-10-CM | POA: Diagnosis not present

## 2023-04-22 DIAGNOSIS — K921 Melena: Secondary | ICD-10-CM | POA: Diagnosis not present

## 2023-04-22 DIAGNOSIS — N261 Atrophy of kidney (terminal): Secondary | ICD-10-CM | POA: Diagnosis not present

## 2023-04-22 DIAGNOSIS — K625 Hemorrhage of anus and rectum: Secondary | ICD-10-CM | POA: Diagnosis not present

## 2023-04-22 LAB — BASIC METABOLIC PANEL WITH GFR
Anion gap: 16 — ABNORMAL HIGH (ref 5–15)
BUN: 53 mg/dL — ABNORMAL HIGH (ref 8–23)
CO2: 16 mmol/L — ABNORMAL LOW (ref 22–32)
Calcium: 7.8 mg/dL — ABNORMAL LOW (ref 8.9–10.3)
Chloride: 103 mmol/L (ref 98–111)
Creatinine, Ser: 7.1 mg/dL — ABNORMAL HIGH (ref 0.61–1.24)
GFR, Estimated: 8 mL/min — ABNORMAL LOW (ref >60)
Glucose, Bld: 107 mg/dL — ABNORMAL HIGH (ref 70–99)
Potassium: 4.2 mmol/L (ref 3.5–5.1)
Sodium: 135 mmol/L (ref 135–145)

## 2023-04-22 LAB — URINALYSIS, ROUTINE W REFLEX MICROSCOPIC
Bacteria, UA: NONE SEEN
Bilirubin Urine: NEGATIVE
Glucose, UA: 150 mg/dL — AB
Ketones, ur: NEGATIVE mg/dL
Leukocytes,Ua: NEGATIVE
Nitrite: NEGATIVE
Protein, ur: >=300 mg/dL — AB
RBC / HPF: >50 RBC/hpf (ref 0–5)
Specific Gravity, Urine: 1.02 (ref 1.005–1.030)
pH: 7 (ref 5.0–8.0)

## 2023-04-22 LAB — APTT: aPTT: 40 s — ABNORMAL HIGH (ref 24–36)

## 2023-04-22 LAB — CBC
HCT: 23.1 % — ABNORMAL LOW (ref 39.0–52.0)
Hemoglobin: 7.2 g/dL — ABNORMAL LOW (ref 13.0–17.0)
MCH: 31.4 pg (ref 26.0–34.0)
MCHC: 31.2 g/dL (ref 30.0–36.0)
MCV: 100.9 fL — ABNORMAL HIGH (ref 80.0–100.0)
Platelets: 500 10*3/uL — ABNORMAL HIGH (ref 150–400)
RBC: 2.29 MIL/uL — ABNORMAL LOW (ref 4.22–5.81)
RDW: 13.4 % (ref 11.5–15.5)
WBC: 11 10*3/uL — ABNORMAL HIGH (ref 4.0–10.5)
nRBC: 0.5 % — ABNORMAL HIGH (ref 0.0–0.2)

## 2023-04-22 LAB — HEPATIC FUNCTION PANEL
ALT: 10 U/L (ref 0–44)
AST: 19 U/L (ref 15–41)
Albumin: 2.3 g/dL — ABNORMAL LOW (ref 3.5–5.0)
Alkaline Phosphatase: 99 U/L (ref 38–126)
Bilirubin, Direct: <0.1 mg/dL (ref 0.0–0.2)
Total Bilirubin: 0.5 mg/dL (ref 0.0–1.2)
Total Protein: 5.5 g/dL — ABNORMAL LOW (ref 6.5–8.1)

## 2023-04-22 LAB — TROPONIN I (HIGH SENSITIVITY)
Troponin I (High Sensitivity): 15 ng/L (ref <18)
Troponin I (High Sensitivity): 14 ng/L (ref <18)

## 2023-04-22 LAB — PROTIME-INR
INR: 1.4 — ABNORMAL HIGH (ref 0.8–1.2)
Prothrombin Time: 17.7 s — ABNORMAL HIGH (ref 11.4–15.2)

## 2023-04-22 LAB — BRAIN NATRIURETIC PEPTIDE: B Natriuretic Peptide: 168.8 pg/mL — ABNORMAL HIGH (ref 0.0–100.0)

## 2023-04-22 LAB — IRON AND TIBC
Iron: 131 ug/dL (ref 45–182)
Saturation Ratios: 71 % — ABNORMAL HIGH (ref 17.9–39.5)
TIBC: 185 ug/dL — ABNORMAL LOW (ref 250–450)
UIBC: 54 ug/dL

## 2023-04-22 LAB — FOLATE: Folate: 5.9 ng/mL — ABNORMAL LOW (ref >5.9)

## 2023-04-22 LAB — PREPARE RBC (CROSSMATCH)

## 2023-04-22 MED ORDER — ROSUVASTATIN CALCIUM 10 MG PO TABS
40.0000 mg | ORAL_TABLET | ORAL | Status: DC
  Administered 2023-04-23: 40 mg via ORAL
  Filled 2023-04-22: qty 4

## 2023-04-22 MED ORDER — PANTOPRAZOLE SODIUM 40 MG IV SOLR
40.0000 mg | Freq: Two times a day (BID) | INTRAVENOUS | Status: DC
  Administered 2023-04-22 – 2023-04-23 (×3): 40 mg via INTRAVENOUS
  Filled 2023-04-22 (×3): qty 10

## 2023-04-22 MED ORDER — ONDANSETRON HCL 4 MG PO TABS
4.0000 mg | ORAL_TABLET | Freq: Four times a day (QID) | ORAL | Status: DC | PRN
Start: 2023-04-22 — End: 2023-04-24

## 2023-04-22 MED ORDER — ALLOPURINOL 100 MG PO TABS
100.0000 mg | ORAL_TABLET | Freq: Every day | ORAL | Status: DC
Start: 2023-04-23 — End: 2023-04-24
  Administered 2023-04-23: 100 mg via ORAL
  Filled 2023-04-22: qty 1

## 2023-04-22 MED ORDER — BOOST / RESOURCE BREEZE PO LIQD CUSTOM
1.0000 | Freq: Three times a day (TID) | ORAL | Status: DC
Start: 2023-04-22 — End: 2023-04-24
  Administered 2023-04-22 – 2023-04-23 (×4): 1 via ORAL

## 2023-04-22 MED ORDER — FOLIC ACID 1 MG PO TABS
1.0000 mg | ORAL_TABLET | Freq: Every day | ORAL | Status: DC
  Administered 2023-04-23: 1 mg via ORAL
  Filled 2023-04-22: qty 1

## 2023-04-22 MED ORDER — SODIUM BICARBONATE 8.4 % IV SOLN
INTRAVENOUS | Status: AC
  Filled 2023-04-22: qty 50

## 2023-04-22 MED ORDER — HYDROMORPHONE HCL 1 MG/ML IJ SOLN
0.5000 mg | Freq: Once | INTRAMUSCULAR | Status: AC
  Administered 2023-04-22: 0.5 mg via INTRAVENOUS
  Filled 2023-04-22: qty 0.5

## 2023-04-22 MED ORDER — GENTAMICIN SULFATE 0.1 % EX CREA
1.0000 | TOPICAL_CREAM | Freq: Every day | CUTANEOUS | Status: DC
  Administered 2023-04-23 (×3): 1 via TOPICAL
  Filled 2023-04-22: qty 15

## 2023-04-22 MED ORDER — SODIUM CHLORIDE 0.9% IV SOLUTION
Freq: Once | INTRAVENOUS | Status: AC
  Filled 2023-04-22: qty 250

## 2023-04-22 MED ORDER — GABAPENTIN 300 MG PO CAPS
300.0000 mg | ORAL_CAPSULE | Freq: Every day | ORAL | Status: DC
Start: 2023-04-22 — End: 2023-04-24
  Administered 2023-04-22 – 2023-04-23 (×2): 300 mg via ORAL
  Filled 2023-04-22 (×2): qty 1

## 2023-04-22 MED ORDER — ONDANSETRON HCL 4 MG/2ML IJ SOLN
4.0000 mg | Freq: Four times a day (QID) | INTRAMUSCULAR | Status: DC | PRN
  Administered 2023-04-22: 4 mg via INTRAVENOUS
  Filled 2023-04-22: qty 2

## 2023-04-22 MED ORDER — SODIUM CHLORIDE 0.9% FLUSH
3.0000 mL | Freq: Two times a day (BID) | INTRAVENOUS | Status: DC
  Administered 2023-04-22 – 2023-04-23 (×3): 3 mL via INTRAVENOUS

## 2023-04-22 MED ORDER — ACETAMINOPHEN 650 MG RE SUPP: 650.0000 mg | Freq: Four times a day (QID) | RECTAL | Status: DC | PRN

## 2023-04-22 MED ORDER — SODIUM BICARBONATE 650 MG PO TABS
650.0000 mg | ORAL_TABLET | Freq: Three times a day (TID) | ORAL | Status: DC
  Administered 2023-04-22 – 2023-04-23 (×5): 650 mg via ORAL
  Filled 2023-04-22 (×5): qty 1

## 2023-04-22 MED ORDER — SODIUM CHLORIDE 0.9 % IV SOLN
250.0000 mL | INTRAVENOUS | Status: AC | PRN
  Administered 2023-04-22: 250 mL via INTRAVENOUS

## 2023-04-22 MED ORDER — MECLIZINE HCL 25 MG PO TABS: 25.0000 mg | ORAL_TABLET | Freq: Two times a day (BID) | ORAL | Status: DC | PRN

## 2023-04-22 MED ORDER — SODIUM BICARBONATE 8.4 % IV SOLN
100.0000 meq | Freq: Once | INTRAVENOUS | Status: AC
  Administered 2023-04-22: 100 meq via INTRAVENOUS
  Filled 2023-04-22: qty 50

## 2023-04-22 MED ORDER — DELFLEX-LC/1.5% DEXTROSE 344 MOSM/L IP SOLN
INTRAPERITONEAL | Status: DC
  Filled 2023-04-22 (×3): qty 3000

## 2023-04-22 MED ORDER — HYDROCODONE-ACETAMINOPHEN 5-325 MG PO TABS
1.0000 | ORAL_TABLET | Freq: Four times a day (QID) | ORAL | Status: DC | PRN
  Administered 2023-04-22 – 2023-04-23 (×3): 1 via ORAL
  Filled 2023-04-22 (×3): qty 1

## 2023-04-22 MED ORDER — SODIUM CHLORIDE 0.9% FLUSH: 3.0000 mL | INTRAVENOUS | Status: DC | PRN

## 2023-04-22 MED ORDER — METHOCARBAMOL 500 MG PO TABS: 500.0000 mg | ORAL_TABLET | Freq: Three times a day (TID) | ORAL | Status: DC | PRN

## 2023-04-22 MED ORDER — PANTOPRAZOLE SODIUM 40 MG IV SOLR
80.0000 mg | Freq: Once | INTRAVENOUS | Status: AC
  Administered 2023-04-22: 80 mg via INTRAVENOUS
  Filled 2023-04-22: qty 20

## 2023-04-22 MED ORDER — ESCITALOPRAM OXALATE 10 MG PO TABS
10.0000 mg | ORAL_TABLET | ORAL | Status: DC
  Administered 2023-04-23: 10 mg via ORAL
  Filled 2023-04-22: qty 1

## 2023-04-22 MED ORDER — LACTATED RINGERS IV BOLUS
500.0000 mL | Freq: Once | INTRAVENOUS | Status: AC
  Administered 2023-04-22: 500 mL via INTRAVENOUS

## 2023-04-22 MED ORDER — ONDANSETRON HCL 4 MG/2ML IJ SOLN
4.0000 mg | Freq: Once | INTRAMUSCULAR | Status: AC
Start: 2023-04-22 — End: 2023-04-22
  Administered 2023-04-22: 4 mg via INTRAVENOUS
  Filled 2023-04-22: qty 2

## 2023-04-22 MED ORDER — CALCITRIOL 0.25 MCG PO CAPS
0.2500 ug | ORAL_CAPSULE | Freq: Every day | ORAL | Status: DC
  Administered 2023-04-23: 0.25 ug via ORAL
  Filled 2023-04-22 (×2): qty 1

## 2023-04-22 MED ORDER — IOHEXOL 350 MG/ML SOLN
100.0000 mL | Freq: Once | INTRAVENOUS | Status: AC | PRN
  Administered 2023-04-22: 100 mL via INTRAVENOUS

## 2023-04-22 MED ORDER — ORAL CARE MOUTH RINSE: 15.0000 mL | OROMUCOSAL | Status: DC | PRN

## 2023-04-22 MED ORDER — ACETAMINOPHEN 325 MG PO TABS: 650.0000 mg | ORAL_TABLET | Freq: Four times a day (QID) | ORAL | Status: DC | PRN

## 2023-04-22 NOTE — ED Notes (Signed)
 Pt had an episode of incontinence of diarrhea and an episode of vomiting.  Pt assisted to the BR where he was cleaned up and a gown placed on pt.  Pt was assisted back to his bed and denies any needs at this time.

## 2023-04-22 NOTE — H&P (Addendum)
 [facility] History and Physical   Patient: Casey French GYI:[id number]   PCP: Jerl Mina, MD DOB: May 06, 1950   DOA: 04/22/2023   DOS: 04/22/2023   DOS: the patient was seen and examined on 04/22/2023  Patient coming from: The patient is coming from Home  Chief Complaint: Dark stools x 2 days  and tinge of blood in urine x 6 days   HPI: TOMMEY BARRET is a 73 y.o. male with Past medical history of ESRD on PD, PAfib on DOAC (Last dose was on Friday 3/21) CAD, HTN, HLD, T2DM, Gout, urinary bladder cancer s/p chemo, presented at [facility] ED with complaining of feeling weak, shortness of breath, lower abdominal pain with nausea and vomiting.  Patient had blood in the urine last Saturday, which is improving but is still has tinge of blood and noticed darker stools for past 2 days.  Patient stopped Eliquis on last Friday 321 after noticing blood in the urine.  Patient is taking baby aspirin.  Denied any bright red blood in the stool.    ED Course: VS afebrile, HR 93, RR 20, BP 96/61, 100% on room air CMP: Bicarb 16, BG 107, BUN 53, creatinine 7.1, calcium 7.6, anion gap 16, albumin 2.3 BNP 168, troponin x 1 negative CBC WBC 11 slightly elevated, hemoglobin 7.2 low, platelet 500 K elevated CTA chest negative for PE CT a/p: IMPRESSION: 1. No acute abnormality in the abdomen/pelvis. Diminished bladder wall thickening from prior. 2. Peritoneal dialysis catheter from a left-sided approach. Small amount of free fluid in the pelvis, diminished from prior exam. 3. Colonic diverticulosis. Suspected mural hypertrophy of the sigmoid colon, can be seen with chronic inflammation, but no active diverticulitis.  TRH was consulted for admission and further management as below  Review of Systems: as mentioned in the history of present illness.  All other systems reviewed and are negative.  Past Medical History:  Diagnosis Date   Acute diverticulitis 06/14/2014   Acute ST elevation myocardial  infarction (STEMI) of inferior wall (HCC) 2003   a.) PCI with Cypher stent to the RCA   Anemia of chronic renal failure    Aortic atherosclerosis (HCC)    Bladder tumor    CAP (community acquired pneumonia) 06/2022   Cerebral microvascular disease    Coronary artery disease involving native coronary artery of native heart without angina pectoris 2003   a.) PCI with Cypher stent to the RCA 2003; b.) LHC 08/2003: 100% dRCA, 60% pLAD - med mgmt; c.) LHC/PCI 08/21/2015: occluded RCA at site of prior stent placement, 70% mLAD (FFR 0.79; 3.5 x 12 mm Xience DES), 50% dLAD (FFR 0.70); d.) MV 11/11/2016: no ischemia   Depression    Diverticulosis    End-stage renal disease on peritoneal dialysis (HCC)    GERD (gastroesophageal reflux disease)    GI bleed 05/15/2019   Gout    Heart murmur    Hematemesis 05/15/2019   Hepatic cyst    Hepatomegaly    Hyperlipidemia    Hypertension    Lacunar infarction (HCC) 02/11/2007   a.) CT head 02/11/2007: lacunar infarction involving thalamus and external capsule region on the RIGHT; b.) MRI brain 02/03/2020: multiple remote lacunar infarcts involving the RIGHT basal ganglia, RIGHT thalamus, and LEFT middle cerebellar peduncle   Long-term use of aspirin therapy    Myocardial bridge    On apixaban therapy    Pancolitis (HCC) 09/2022   Paroxysmal atrial fibrillation (HCC)    a.) CHA2DS2VASc = 6 (age, HTN,  CVA x 2, prior MI/vascular disease, T2DM) as of 03/11/2023; b.) rate/rhythm maintained on oral metoprolol succinate; chronically anticoagulated with dose reduced apixaban   Renal cyst, right    Sigmoid diverticulitis 03/2022   T2DM (type 2 diabetes mellitus) (HCC)    Traumatic skull fracture and resulting subarachnoid hemorrhage 03/24/2013   a.) MOI was slipping and falling on ice   Unstable angina [facility])    Past Surgical History:  Procedure Laterality Date   BLADDER INSTILLATION N/A 04/05/2023   Procedure: BLADDER INSTILLATION OF GEMCITABINE;  Surgeon:  Riki Altes, MD;  Location: [facility] ORS;  Service: Urology;  Laterality: N/A;   CAPD INSERTION N/A 08/13/2020   Procedure: LAPAROSCOPIC INSERTION CONTINUOUS AMBULATORY PERITONEAL DIALYSIS  (CAPD) CATHETER;  Surgeon: Campbell Lerner, MD;  Location: [facility] ORS;  Service: General;  Laterality: N/A;   CARDIAC CATHETERIZATION     CATARACT EXTRACTION W/PHACO Right 07/05/2019   Procedure: CATARACT EXTRACTION PHACO AND INTRAOCULAR LENS PLACEMENT (IOC) RIGHT DIABETIC;  Surgeon: Galen Manila, MD;  Location: [facility] ORS;  Service: Ophthalmology;  Laterality: Right;  US00:59.3 CDE8.41 LOT [id number] h   COLONOSCOPY WITH PROPOFOL N/A 04/01/2017   Procedure: COLONOSCOPY WITH PROPOFOL;  Surgeon: Christena Deem, MD;  Location: [facility] ENDOSCOPY;  Service: Endoscopy;  Laterality: N/A;   CORONARY ANGIOPLASTY WITH STENT PLACEMENT  10/2001   RCA   CORONARY ANGIOPLASTY WITH STENT PLACEMENT Left 08/21/2015   Procedure: CORONARY ANGIOPLASTY WITH STENT PLACEMENT (mid LAD); Location: [facility]; Surgeon: Willene Hatchet, MD   HAND SURGERY  2015   3 screws in right hand   TOTAL HIP ARTHROPLASTY Left 04/22/2020   Procedure: TOTAL HIP ARTHROPLASTY ANTERIOR APPROACH;  Surgeon: Kennedy Bucker, MD;  Location: [facility] ORS;  Service: Orthopedics;  Laterality: Left;   TRANSURETHRAL RESECTION OF BLADDER TUMOR N/A 04/05/2023   Procedure: TURBT (TRANSURETHRAL RESECTION OF BLADDER TUMOR);  Surgeon: Riki Altes, MD;  Location: [facility] ORS;  Service: Urology;  Laterality: N/A;   Social History:  reports that he quit smoking about 22 years ago. His smoking use included cigarettes. He has never used smokeless tobacco. He reports that he does not drink alcohol and does not use drugs.  Allergies  Allergen Reactions   Morphine Anxiety    agitation      Family history reviewed and not pertinent Family History  Problem Relation Age of Onset   Heart disease Mother    Heart disease Father    CAD Other      Prior to Admission  medications   Medication Sig Start Date End Date Taking? Authorizing Provider  acetaminophen (TYLENOL) 325 MG tablet Take 2 tablets (650 mg total) by mouth every 6 (six) hours as needed for mild pain (or Fever >/= 101). 10/11/22   Lurene Shadow, MD  allopurinol (ZYLOPRIM) 100 MG tablet Take 100 mg by mouth daily.    [provider]  amLODipine (NORVASC) 10 MG tablet Take 1 tablet (10 mg total) by mouth daily. Patient taking differently: Take 10 mg by mouth every morning. 06/28/22   Enedina Finner, MD  apixaban (ELIQUIS) 2.5 MG TABS tablet Take 2.5 mg by mouth 2 (two) times daily.    [provider]  aspirin 81 MG EC tablet Take 81 mg by mouth daily.    [provider]  calcitRIOL (ROCALTROL) 0.25 MCG capsule Take 1 capsule (0.25 mcg total) by mouth daily. 04/25/22   Loyce Dys, MD  escitalopram (LEXAPRO) 10 MG tablet Take 10 mg by mouth every morning. 05/25/22 05/20/23  [provider]  folic acid (FOLVITE) 1 MG tablet Take 1 tablet (1 mg total) by mouth daily. Take 1 tablet daily 10/11/22   Lurene Shadow, MD  gabapentin (NEURONTIN) 300 MG capsule Take 300 mg by mouth at bedtime. 09/25/16   [provider]  HYDROcodone-acetaminophen (NORCO/VICODIN) 5-325 MG tablet Take 1 tablet by mouth every 6 (six) hours as needed for moderate pain (pain score 4-6). 04/05/23   Stoioff, Verna Czech, MD  hydrocortisone (ANUSOL-HC) 2.5 % rectal cream Place 1 application rectally 2 (two) times daily as needed for hemorrhoids or anal itching.    [provider]  isosorbide mononitrate (IMDUR) 60 MG 24 hr tablet Take 60 mg by mouth every morning.    [provider]  losartan (COZAAR) 100 MG tablet Take 1 tablet (100 mg total) by mouth daily. 06/28/22   Enedina Finner, MD  meclizine (ANTIVERT) 25 MG tablet Take 1 tablet (25 mg total) by mouth 2 (two) times daily. 04/24/22   Loyce Dys, MD  methocarbamol (ROBAXIN) 500 MG tablet Take 500 mg by mouth 3 (three) times daily as  needed. 11/13/20   [provider]  metoprolol succinate (TOPROL-XL) 100 MG 24 hr tablet Take 100 mg by mouth every morning. Take with or immediately following a meal.    [provider]  nitroGLYCERIN (NITROSTAT) 0.4 MG SL tablet Place 0.4 mg under the tongue every 5 (five) minutes as needed for chest pain.    [provider]  omeprazole (PRILOSEC) 20 MG capsule Take 20 mg by mouth every morning.    [provider]  ondansetron (ZOFRAN-ODT) 4 MG disintegrating tablet Take 1 tablet (4 mg total) by mouth every 8 (eight) hours as needed. 03/15/23   Delton Prairie, MD  psyllium (METAMUCIL) 58.6 % packet Take 1 packet by mouth 2 (two) times daily.    [provider]  rosuvastatin (CRESTOR) 40 MG tablet Take 40 mg by mouth every morning. 03/06/19   [provider]  Vitamin D, Ergocalciferol, (DRISDOL) 1.25 MG (50000 UNIT) CAPS capsule Take 50,000 Units by mouth once a week. 10/23/20   [provider]    Physical Exam: Vitals:   04/22/23 1204 04/22/23 1530 04/22/23 1600 04/22/23 1630  BP: 96/61 (!) 95/58 (!) 93/52 104/62  Pulse: 93 82 81 77  Resp: 20     Temp: 98.1 F (36.7 C)     TempSrc: Oral     SpO2: 100% 99% 97% 96%    General: alert and oriented to time, place, and person. Appear in mild distress, affect appropriate Eyes: PERRLA, Conjunctiva normal ENT: Oral Mucosa Clear, moist  Neck: no JVD, no Abnormal Mass Or lumps Cardiovascular: S1 and S2 Present, no Murmur, peripheral pulses symmetrical Respiratory: good respiratory effort, Bilateral Air entry equal and Decreased, no signs of accessory muscle use, Clear to Auscultation, no Crackles, no wheezes Abdomen: Bowel Sound present, Soft and mild lower abd tenderness, no hernia Skin: no rashes  Extremities: no Pedal edema, no calf tenderness Neurologic: without any new focal findings Gait not checked due to patient safety concerns  Data Reviewed: I have personally reviewed and  interpreted labs, imaging as discussed below.  CBC: Recent Labs  Lab 04/16/23 1459 04/22/23 1206  WBC 7.4 11.0*  HGB 8.9* 7.2*  HCT 27.4* 23.1*  MCV 95.1 100.9*  PLT 248 500*   Basic Metabolic Panel: Recent Labs  Lab 04/16/23 1459 04/22/23 1206  NA 136 135  K 3.6 4.2  CL 104 103  CO2 17* 16*  GLUCOSE 109* 107*  BUN 57* 53*  CREATININE 7.11* 7.10*  CALCIUM 8.1* 7.8*   GFR: CrCl cannot be calculated (Unknown ideal weight.). Liver Function Tests: Recent Labs  Lab 04/16/23 1459 04/22/23 1206  AST 22 19  ALT 10 10  ALKPHOS 97 99  BILITOT 0.6 0.5  PROT 6.7 5.5*  ALBUMIN 2.6* 2.3*   Recent Labs  Lab 04/16/23 1459  LIPASE 84*   No results for input(s): "AMMONIA" in the last 168 hours. Coagulation Profile: No results for input(s): "INR", "PROTIME" in the last 168 hours. Cardiac Enzymes: No results for input(s): "CKTOTAL", "CKMB", "CKMBINDEX", "TROPONINI" in the last 168 hours. BNP (last 3 results) No results for input(s): "PROBNP" in the last 8760 hours. HbA1C: No results for input(s): "HGBA1C" in the last 72 hours. CBG: No results for input(s): "GLUCAP" in the last 168 hours. Lipid Profile: No results for input(s): "CHOL", "HDL", "LDLCALC", "TRIG", "CHOLHDL", "LDLDIRECT" in the last 72 hours. Thyroid Function Tests: No results for input(s): "TSH", "T4TOTAL", "FREET4", "T3FREE", "THYROIDAB" in the last 72 hours. Anemia Panel: No results for input(s): "VITAMINB12", "FOLATE", "FERRITIN", "TIBC", "IRON", "RETICCTPCT" in the last 72 hours. Urine analysis:    Component Value Date/Time   COLORURINE AMBER (A) 04/16/2023 1707   APPEARANCEUR HAZY (A) 04/16/2023 1707   APPEARANCEUR Clear 03/08/2023 1640   LABSPEC 1.012 04/16/2023 1707   PHURINE 8.0 04/16/2023 1707   GLUCOSEU >=500 (A) 04/16/2023 1707   HGBUR LARGE (A) 04/16/2023 1707   BILIRUBINUR NEGATIVE 04/16/2023 1707   BILIRUBINUR Negative 03/08/2023 1640   KETONESUR NEGATIVE 04/16/2023 1707   PROTEINUR  >=300 (A) 04/16/2023 1707   NITRITE NEGATIVE 04/16/2023 1707   LEUKOCYTESUR NEGATIVE 04/16/2023 1707    Radiological Exams on Admission: CT ABDOMEN PELVIS W CONTRAST Result Date: 04/22/2023 CLINICAL DATA:  Lower abdominal pain. Weakness. End-stage renal disease on peritoneal dialysis, history of bladder cancer. EXAM: CT ABDOMEN AND PELVIS WITH CONTRAST TECHNIQUE: Multidetector CT imaging of the abdomen and pelvis was performed using the standard protocol following bolus administration of intravenous contrast. RADIATION DOSE REDUCTION: This exam was performed according to the departmental dose-optimization program which includes automated exposure control, adjustment of the mA and/or kV according to patient size and/or use of iterative reconstruction technique. CONTRAST:  OMNIPAQUE IOHEXOL 350 MG/ML SOLN COMPARISON:  CT 6 days ago 04/16/2023 FINDINGS: Lower chest: No acute findings. Hepatobiliary: No focal liver abnormality is seen. No gallstones, gallbladder wall thickening, or biliary dilatation. Pancreas: Unremarkable. No pancreatic ductal dilatation or surrounding inflammatory changes. Spleen: Normal in size without focal abnormality. Adrenals/Urinary Tract: No adrenal nodule. Bilateral renal parenchymal atrophy. Bilateral renal cysts. No further follow-up imaging is recommended. Slight extrarenal pelvis configuration of the left kidney. No frank hydronephrosis. The urinary bladder is partially distended. Streak artifact from left hip arthroplasty obscures detailed bladder assessment. Stomach/Bowel: Decompressed stomach. Scattered fecalization of small bowel contents. No obstruction or inflammatory change. Colonic diverticulosis without diverticulitis. Suspected mural hypertrophy of the sigmoid. Normal appendix visualized. Vascular/Lymphatic: Dense aortic and branch atherosclerosis. No aortic aneurysm. The portal vein is patent. No enlarged lymph nodes in the abdomen or pelvis. Reproductive: Prostate  is unremarkable. Other: Peritoneal dialysis catheter from a left-sided approach. The tip is looped in the left mid abdomen. There is a small amount of free fluid in the pelvis, diminished from prior exam. Single focus of free air in the upper abdomen is not unexpected in the setting of peritoneal dialysis. No inguinal hernia. Musculoskeletal: Right L5 pars defect. No suspicious bone lesion. Left  hip arthroplasty IMPRESSION: 1. No acute abnormality in the abdomen/pelvis. Diminished bladder wall thickening from prior. 2. Peritoneal dialysis catheter from a left-sided approach. Small amount of free fluid in the pelvis, diminished from prior exam. 3. Colonic diverticulosis. Suspected mural hypertrophy of the sigmoid colon, can be seen with chronic inflammation, but no active diverticulitis. Aortic Atherosclerosis (ICD10-I70.0). Electronically Signed   By: Narda Rutherford M.D.   On: 04/22/2023 16:41   CT Angio Chest PE W/Cm &/Or Wo Cm Result Date: 04/22/2023 CLINICAL DATA:  High probability for PE.  Bladder cancer. EXAM: CT ANGIOGRAPHY CHEST WITH CONTRAST TECHNIQUE: Multidetector CT imaging of the chest was performed using the standard protocol during bolus administration of intravenous contrast. Multiplanar CT image reconstructions and MIPs were obtained to evaluate the vascular anatomy. RADIATION DOSE REDUCTION: This exam was performed according to the departmental dose-optimization program which includes automated exposure control, adjustment of the mA and/or kV according to patient size and/or use of iterative reconstruction technique. CONTRAST:  OMNIPAQUE IOHEXOL 350 MG/ML SOLN COMPARISON:  CT chest abdomen and pelvis 04/18/2022 FINDINGS: Cardiovascular: Satisfactory opacification of the pulmonary arteries to the segmental level. No evidence of pulmonary embolism. Normal heart size. No pericardial effusion. There are atherosclerotic calcifications of the aorta. Mediastinum/Nodes: Thyroid gland is mildly  diffusely heterogeneous. There are no enlarged mediastinal or hilar lymph nodes. Esophagus is within normal limits. Lungs/Pleura: Mild emphysematous changes are present. There is scarring in both lung apices. There is a band of atelectasis in the right lower lobe, unchanged. There is no new focal lung consolidation, pleural effusion or pneumothorax. Upper Abdomen: No acute abnormality. Musculoskeletal: No chest wall abnormality. No acute or significant osseous findings. Review of the MIP images confirms the above findings. IMPRESSION: 1. No evidence for pulmonary embolism. 2. Stable band of atelectasis in the right lower lobe. Aortic Atherosclerosis (ICD10-I70.0) and Emphysema (ICD10-J43.9). Electronically Signed   By: Darliss Cheney M.D.   On: 04/22/2023 16:41   DG Chest 2 View Result Date: 04/22/2023 CLINICAL DATA:  Shortness of breath. EXAM: CHEST - 2 VIEW COMPARISON:  03/15/2023 FINDINGS: Linear areas of atelectasis/scarring noted at the lung bases, right more than left. Bilateral lung fields are otherwise clear. No dense consolidation or lung collapse. Bilateral costophrenic angles are clear. Normal cardio-mediastinal silhouette. No acute osseous abnormalities. The soft tissues are within normal limits. IMPRESSION: No active cardiopulmonary disease. Electronically Signed   By: Jules Schick M.D.   On: 04/22/2023 13:58   EKG: Independently reviewed. normal EKG, normal sinus rhythm. HR 91  I reviewed all nursing notes, pharmacy notes, vitals, pertinent old records.  Assessment/Plan Principal Problem:   Acute GI bleeding  # Acute GI bleeding, most likely upper GI S/p pantoprazole 80 mg one-time dose given in the ED Started PPI 40 mg IV twice daily Started on clear liquid diet Follow-up with GI, secure chat text sent to Dr. Norma Fredrickson Please consult GI tomorrow a.m. again   # Acute blood loss anemia, hemoglobin 7.2 admission Hb 8.9 on 3/22 Transfuse 1 unit of PRBC today Monitor H&H and transfuse  if hemoglobin less than 7 Follow anemia workup   # Hematuria, history of bladder cancer Complaining of lower abdominal pain, no significant findings on CT scan Follow bladder scan to rule out urinary obstruction due to blood clots Follow renal sonogram Consider urology consult if patient needs CBI   # ESRD on peritoneal dialysis Metabolic acidosis, s/p bicarbonate 100 mEq IV push given Started oral bicarbonate supplement Monitor electrolytes Resumed Calcitrol   #  PAfib on DOAC (Last dose was on Friday 3/21) CAD, HTN, HLD Continue to monitor on telemetry Held aspirin and Eliquis due to bleeding Held antihypertensive medications due to low blood pressure Resume Crestor Monitor BP and titrate medications accordingly  History of diabetes mellitus, patient is not on any medication. Blood glucose slightly high at Continue to monitor  Depression: Lexapro 10 mg p.o. daily home dose Gout: Allopurinol Chronic pain: resumed home medications.   Nutrition: Clear liquid diet DVT Prophylaxis: SCD, pharmacological prophylaxis contraindicated due to GI bleeding  Advance goals of care discussion: DNR   Consults: I personally Discussed with nephrology and text message to GI  Family Communication: family was present at bedside, at the time of interview.  Opportunity was given to ask question and all questions were answered satisfactorily.  Disposition: Admitted as inpatient, telemetry unit. Likely to be discharged Home, in 2-3 days stable.  I have discussed plan of care as described above with RN and patient/family.  Severity of Illness: The appropriate patient status for this patient is INPATIENT. Inpatient status is judged to be reasonable and necessary in order to provide the required intensity of service to ensure the patient's safety. The patient's presenting symptoms, physical exam findings, and initial radiographic and laboratory data in the context of their chronic comorbidities  is felt to place them at high risk for further clinical deterioration. Furthermore, it is not anticipated that the patient will be medically stable for discharge from the hospital within 2 midnights of admission.   * I certify that at the point of admission it is my clinical judgment that the patient will require inpatient hospital care spanning beyond 2 midnights from the point of admission due to high intensity of service, high risk for further deterioration and high frequency of surveillance required.*   Total time spent: 80 min   Author: Gillis Santa, MD [facility] 04/22/2023 5:47 PM   To reach On-call, see care teams to locate the attending and reach out to them via [email or web address]. If 7PM-7AM, please contact night-coverage If you still have difficulty reaching the attending provider, please page the [facility] (Director on Call) for [facility] on amion for assistance.

## 2023-04-22 NOTE — ED Provider Notes (Signed)
-----------------------------------------   3:34 PM on 04/22/2023 -----------------------------------------  Blood pressure 96/61, pulse 93, temperature 98.1 F (36.7 C), temperature source Oral, resp. rate 20, SpO2 100%.  Assuming care from Dr. Jodie Echevaria.  In short, Casey French is a 73 y.o. male with a chief complaint of Weakness .  Refer to the original H&P for additional details.  The current plan of care is to follow-up CT imaging, plan for admission for GI bleed.  ----------------------------------------- 4:51 PM on 04/22/2023 ----------------------------------------- CTA chest is negative for PE or other acute process, CT of his abdomen/pelvis is also unremarkable.  Patient remains hemodynamically stable on reassessment with BP of 104/62, will trend hemoglobin but hold off on transfusion for now.  Case discussed with hospitalist for admission.     Chesley Noon, MD 04/22/23 [phone]

## 2023-04-22 NOTE — ED Triage Notes (Signed)
 Pt comes via [facility] from [facility]. Pt does peritoneal at home and didn't do his treatment last night. He does T, TH and Sat. Pt recently dx with bladder cancer.    Pt  might have some psych issues per Child psychotherapist who told EMS. Pt had access to weapons and may be unsafe.

## 2023-04-22 NOTE — ED Triage Notes (Signed)
 Pt to ED via [facility] from home. Pt reports pain in bladder and back. Pt reports feeling as if he is going to pass out. Pt also endorses SOB. Pt did not do PD at home last pm.   Pt recently dx with bladder cancer and started chemo end of April.   See first nurse note: Pt denies SI/HI. Pt reports feels safe at home.

## 2023-04-22 NOTE — ED Provider Notes (Signed)
 [facility] Provider Note    Event Date/Time   First MD Initiated Contact with Patient 04/22/23 1248     (approximate)   History   Weakness   HPI  Casey French is a 73 y.o. male with history of anemia, GERD, ESRD on peritoneal dialysis, bladder cancer, starting chemo in April, presenting with weakness, lower abdominal pain, nausea, vomiting, pink-tinged urine as well as black stools.  States he feels short of breath but no chest pain.  Wife is at bedside, states that he has not expressed any SI or HI to her.  He denies any SI or HI.  EMS had reported that social work was concerned about psych issues with the patient.  Wife states that he does have history of depression and has been down but does not feel that he is unsafe at home.  He denies any trauma or falls, he is on a blood thinner but has not been able to take it for the last 3 days due to his nausea vomiting.  Independent history obtained from wife.  On independent chart review, he had an echo done in September 2024 that showed normal EF, grade 1 diastolic dysfunction.  He was seen by urology in mid March, was there for catheter removal, noted clear yellow urine at that time.     Physical Exam   Triage Vital Signs: ED Triage Vitals [04/22/23 1204]  Encounter Vitals Group     BP 96/61     Systolic BP Percentile      Diastolic BP Percentile      Pulse Rate 93     Resp 20     Temp 98.1 F (36.7 C)     Temp Source Oral     SpO2 100 %     Weight      Height      Head Circumference      Peak Flow      Pain Score 5     Pain Loc      Pain Education      Exclude from Growth Chart     Most recent vital signs: Vitals:   04/22/23 1204 04/22/23 1530  BP: 96/61 (!) 95/58  Pulse: 93 82  Resp: 20   Temp: 98.1 F (36.7 C)   SpO2: 100% 99%     General: Awake, no distress.  CV:  Good peripheral perfusion.  Resp:  Normal effort.  Clear Abd:  No distention.  Soft, tender in the  suprapubic region Other:  No CVA tenderness.  Rectal exam was done with chaperone, dark brown stool in rectal vault that is Hemoccult positive.   ED Results / Procedures / Treatments   Labs (all labs ordered are listed, but only abnormal results are displayed) Labs Reviewed  BASIC METABOLIC PANEL WITH GFR - Abnormal; Notable for the following components:      Result Value   CO2 16 (*)    Glucose, Bld 107 (*)    BUN 53 (*)    Creatinine, Ser 7.10 (*)    Calcium 7.8 (*)    GFR, Estimated 8 (*)    Anion gap 16 (*)    All other components within normal limits  CBC - Abnormal; Notable for the following components:   WBC 11.0 (*)    RBC 2.29 (*)    Hemoglobin 7.2 (*)    HCT 23.1 (*)    MCV 100.9 (*)    Platelets 500 (*)  nRBC 0.5 (*)    All other components within normal limits  HEPATIC FUNCTION PANEL - Abnormal; Notable for the following components:   Total Protein 5.5 (*)    Albumin 2.3 (*)    All other components within normal limits  BRAIN NATRIURETIC PEPTIDE - Abnormal; Notable for the following components:   B Natriuretic Peptide 168.8 (*)    All other components within normal limits  URINALYSIS, ROUTINE W REFLEX MICROSCOPIC  PROTIME-INR  APTT  CBG MONITORING, ED  TYPE AND SCREEN  TROPONIN I (HIGH SENSITIVITY)  TROPONIN I (HIGH SENSITIVITY)     EKG  EKG shows sinus rhythm, rate 91, normal QRS, normal QTc, baseline is wandering but no obvious ischemic ST elevation, T wave inversion to aVL, T wave flattening to 1, inversions new compared to prior   RADIOLOGY Chest x-ray on my independent interpretation without obvious consolidation   PROCEDURES:  Critical Care performed: No  Procedures   MEDICATIONS ORDERED IN ED: Medications  iohexol (OMNIPAQUE) 350 MG/ML injection 100 mL (has no administration in time range)  pantoprazole (PROTONIX) injection 80 mg (80 mg Intravenous Given 04/22/23 1528)  lactated ringers bolus 500 mL (500 mLs Intravenous New  Bag/Given 04/22/23 1528)  HYDROmorphone (DILAUDID) injection 0.5 mg (0.5 mg Intravenous Given 04/22/23 1528)  ondansetron (ZOFRAN) injection 4 mg (4 mg Intravenous Given 04/22/23 1528)     IMPRESSION / MDM / ASSESSMENT AND PLAN / ED COURSE  I reviewed the triage vital signs and the nursing notes.                              Differential diagnosis includes, but is not limited to, anemia, GI bleed, hematuria possibly secondary to cancer versus UTI, also considered gastroenteritis, viral illness, ACS, patient does not appear fluid overloaded, considered CHF but this is less likely, also considered PE given his history of cancer and him not taking his Eliquis for 3 days.  Will get labs, EKG, troponin, chest x-ray, CT PE study, CT abdomen pelvis.  Will give him IV Protonix, will give him some IV fluids, IV Dilaudid, IV Zofran.  Patient's presentation is most consistent with acute presentation with potential threat to life or bodily function.  Independent review of labs, troponins not elevated, BNP is mildly elevated, LFTs are not significant change, creatinine is elevated but he is on peritoneal dialysis, electrolytes are not severely deranged, he has a leukocytosis, hemoglobin is down compared to prior likely in setting of the hematuria as well as GI bleed.  Patient signed out to oncoming team pending CT imaging results.  He will need to be admitted for further management.  Clinical Course as of 04/22/23 1554  Fri Apr 22, 2023  1553 DG Chest 2 View No active cardiopulmonary disease.  [TT]    Clinical Course User Index [TT] Claybon Jabs, MD     FINAL CLINICAL IMPRESSION(S) / ED DIAGNOSES   Final diagnoses:  Weakness  Lower abdominal pain  Nausea and vomiting, unspecified vomiting type  SOB (shortness of breath)  Gastrointestinal hemorrhage, unspecified gastrointestinal hemorrhage type  Hematuria, unspecified type     Rx / DC Orders   ED Discharge Orders     None         Note:  This document was prepared using Dragon voice recognition software and may include unintentional dictation errors.    Claybon Jabs, MD 04/22/23 [phone]

## 2023-04-23 DIAGNOSIS — K922 Gastrointestinal hemorrhage, unspecified: Secondary | ICD-10-CM | POA: Diagnosis not present

## 2023-04-23 DIAGNOSIS — R310 Gross hematuria: Secondary | ICD-10-CM | POA: Diagnosis not present

## 2023-04-23 DIAGNOSIS — I480 Paroxysmal atrial fibrillation: Secondary | ICD-10-CM

## 2023-04-23 DIAGNOSIS — D62 Acute posthemorrhagic anemia: Secondary | ICD-10-CM | POA: Diagnosis not present

## 2023-04-23 DIAGNOSIS — Z992 Dependence on renal dialysis: Secondary | ICD-10-CM

## 2023-04-23 DIAGNOSIS — N186 End stage renal disease: Secondary | ICD-10-CM

## 2023-04-23 LAB — TYPE AND SCREEN
ABO/RH(D): O POS
Antibody Screen: NEGATIVE
Unit division: 0

## 2023-04-23 LAB — BPAM RBC
Blood Product Expiration Date: 202504222359
ISSUE DATE / TIME: 202503282217
Unit Type and Rh: 5100

## 2023-04-23 LAB — VITAMIN B12: Vitamin B-12: 249 pg/mL (ref 180–914)

## 2023-04-23 LAB — CBC
HCT: 22.5 % — ABNORMAL LOW (ref 39.0–52.0)
Hemoglobin: 7.7 g/dL — ABNORMAL LOW (ref 13.0–17.0)
MCH: 31.6 pg (ref 26.0–34.0)
MCHC: 34.2 g/dL (ref 30.0–36.0)
MCV: 92.2 fL (ref 80.0–100.0)
Platelets: 381 10*3/uL (ref 150–400)
RBC: 2.44 MIL/uL — ABNORMAL LOW (ref 4.22–5.81)
RDW: 16.1 % — ABNORMAL HIGH (ref 11.5–15.5)
WBC: 7.3 10*3/uL (ref 4.0–10.5)
nRBC: 0.3 % — ABNORMAL HIGH (ref 0.0–0.2)

## 2023-04-23 LAB — PHOSPHORUS: Phosphorus: 6 mg/dL — ABNORMAL HIGH (ref 2.5–4.6)

## 2023-04-23 LAB — BASIC METABOLIC PANEL WITH GFR
Anion gap: 10 (ref 5–15)
BUN: 49 mg/dL — ABNORMAL HIGH (ref 8–23)
CO2: 23 mmol/L (ref 22–32)
Calcium: 7.4 mg/dL — ABNORMAL LOW (ref 8.9–10.3)
Chloride: 102 mmol/L (ref 98–111)
Creatinine, Ser: 6.83 mg/dL — ABNORMAL HIGH (ref 0.61–1.24)
GFR, Estimated: 8 mL/min — ABNORMAL LOW (ref >60)
Glucose, Bld: 99 mg/dL (ref 70–99)
Potassium: 3.4 mmol/L — ABNORMAL LOW (ref 3.5–5.1)
Sodium: 135 mmol/L (ref 135–145)

## 2023-04-23 LAB — FERRITIN: Ferritin: 1596 ng/mL — ABNORMAL HIGH (ref 24–336)

## 2023-04-23 LAB — MAGNESIUM: Magnesium: 1.5 mg/dL — ABNORMAL LOW (ref 1.7–2.4)

## 2023-04-23 MED ORDER — SODIUM CHLORIDE 0.9% FLUSH: 3.0000 mL | INTRAVENOUS | Status: DC | PRN

## 2023-04-23 MED ORDER — SODIUM CHLORIDE 0.9% FLUSH
3.0000 mL | Freq: Two times a day (BID) | INTRAVENOUS | Status: DC
  Administered 2023-04-23: 5 mL via INTRAVENOUS

## 2023-04-23 NOTE — Progress Notes (Signed)
 Progress Note   Patient: Casey French:[id number] DOB: 07/20/1950 DOA: 04/22/2023     1 DOS: the patient was seen and examined on 04/23/2023   Brief hospital course: Casey French is a 73 y.o. male with Past medical history of ESRD on PD, PAfib on DOAC (Last dose was on Friday 3/21) CAD, HTN, HLD, T2DM, Gout, urinary bladder cancer s/p chemo, presented at [facility] ED with complaining of feeling weak, shortness of breath, lower abdominal pain with nausea and vomiting.  Patient had blood in the urine last Saturday, which is improving but is still has tinge of blood and noticed darker stools for past 2 days.   Patient is admitted to the hospitalist service for further management evaluation of GI bleeding, acute blood loss anemia, hematuria.  Assessment and Plan: Acute GI bleeding, most likely upper GI Continue pantoprazole 40 mg IV twice daily Started on clear liquid diet.  Hold aspirin, Eliquis GI evaluation appreciated. Plan to do EGD tomorrow.   Acute blood loss anemia, hemoglobin 7.2 admission Hemoglobin 7.7 today1 unit of PRBC Monitor H&H and transfuse if hemoglobin less than 7 Ferritin add-on, folate level lower side.  Continue supplements.   Hematuria, history of bladder cancer Complaining of lower abdominal pain, no significant findings on CT scan Renal sono shows atrophic kidneys, medical renal disease. Hematuria better today Consider urology consult if patient needs CBI   ESRD on peritoneal dialysis Metabolic acidosis, s/p bicarbonate 100 mEq IV push given Continue oral bicarbonate supplement. Nephrology on board for PD night Monitor electrolytes. Resumed Calcitrol.   P paroxysmal Afib on DOAC (Last dose was on Friday 3/21) CAD, HTN, HLD Continue to monitor on telemetry Held aspirin and Eliquis due to bleeding Held antihypertensive medications due to low blood pressure Resume Crestor. Monitor BP and titrate medications accordingly   History of diabetes  mellitus, patient is not on any medication. Blood glucose slightly high at Continue to monitor   Depression: Lexapro 10 mg p.o. daily home dose.  Gout: Continue allopurinol.  Chronic pain: resumed home medications.      Out of bed to chair. Incentive spirometry. Nursing supportive care. Fall, aspiration precautions. Diet:  Diet Orders (From admission, onward)     Start     Ordered   04/24/23 0001  Diet NPO time specified  Diet effective midnight        04/23/23 0911   04/23/23 0911  Diet full liquid Fluid consistency: Thin  Diet effective now       Question:  Fluid consistency:  Answer:  Thin   04/23/23 0911           DVT prophylaxis: SCDs Start: 04/22/23 1725 Level of care: Telemetry Medical    Code Status: Limited: Do not attempt resuscitation (DNR) -DNR-LIMITED -Do Not Intubate/DNI   Subjective: Patient is seen and examined today morning. He has no rectal bleeding, nausea, vomiting, abdominal pain at this time, able to tolerate diet. Has PD last night.  Physical Exam: Vitals:   04/23/23 0123 04/23/23 0124 04/23/23 0524 04/23/23 0933  BP: 111/70 111/70 114/71 102/61  Pulse: 66 66 71 71  Resp: 18 18 18    Temp: 98.3 F (36.8 C) 98.3 F (36.8 C) 98.3 F (36.8 C) 97.9 F (36.6 C)  TempSrc: Oral Oral    SpO2: 97% 97% 100% 98%  Weight:   60.8 kg   Height:        General - Elderly thin built Caucasian male, no apparent distress HEENT - PERRLA, EOMI,  atraumatic head, non tender sinuses. Lung - Clear, basal rales, no rhonchi, wheezes. Heart - S1, S2 heard, no murmurs, rubs, trace pedal edema. Abdomen - Soft, non tender, bowel sounds good Neuro - Alert, awake and oriented x 3, non focal exam. Skin - Warm and dry.  Data Reviewed:      Latest Ref Rng & Units 04/23/2023    5:03 AM 04/22/2023   12:06 PM 04/16/2023    2:59 PM  CBC  WBC 4.0 - 10.5 K/uL 7.3  11.0  7.4   Hemoglobin 13.0 - 17.0 g/dL 7.7  7.2  8.9   Hematocrit 39.0 - 52.0 % 22.5  23.1  27.4    Platelets 150 - 400 K/uL 381  500  248       Latest Ref Rng & Units 04/23/2023    5:03 AM 04/22/2023   12:06 PM 04/16/2023    2:59 PM  BMP  Glucose 70 - 99 mg/dL 99  161  096   BUN 8 - 23 mg/dL 49  53  57   Creatinine 0.61 - 1.24 mg/dL 0.45  4.09  8.11   Sodium 135 - 145 mmol/L 135  135  136   Potassium 3.5 - 5.1 mmol/L 3.4  4.2  3.6   Chloride 98 - 111 mmol/L 102  103  104   CO2 22 - 32 mmol/L 23  16  17    Calcium 8.9 - 10.3 mg/dL 7.4  7.8  8.1    US RENAL Result Date: 04/22/2023 CLINICAL DATA:  Hematuria.  History of bladder cancer. EXAM: RENAL / URINARY TRACT ULTRASOUND COMPLETE COMPARISON:  CT 04/22/2023 FINDINGS: Right Kidney: Renal measurements: 8.2 x 3.2 x 3.4 cm = volume: 46 mL. 2.5 cm lower pole cyst. Cortical thinning with increased echotexture. No hydronephrosis. Left Kidney: Renal measurements: 5.9 x 3.3 x 2.6 cm = volume: 26 mL. Not well visualized. Increased echotexture. No hydronephrosis. Bladder: Appears normal for degree of bladder distention. Other: None. IMPRESSION: Atrophic kidneys bilaterally with increased echotexture compatible with chronic medical renal disease. No hydronephrosis. Electronically Signed   By: Charlett Nose M.D.   On: 04/22/2023 19:08   CT ABDOMEN PELVIS W CONTRAST Result Date: 04/22/2023 CLINICAL DATA:  Lower abdominal pain. Weakness. End-stage renal disease on peritoneal dialysis, history of bladder cancer. EXAM: CT ABDOMEN AND PELVIS WITH CONTRAST TECHNIQUE: Multidetector CT imaging of the abdomen and pelvis was performed using the standard protocol following bolus administration of intravenous contrast. RADIATION DOSE REDUCTION: This exam was performed according to the departmental dose-optimization program which includes automated exposure control, adjustment of the mA and/or kV according to patient size and/or use of iterative reconstruction technique. CONTRAST:  OMNIPAQUE IOHEXOL 350 MG/ML SOLN COMPARISON:  CT 6 days ago 04/16/2023 FINDINGS:  Lower chest: No acute findings. Hepatobiliary: No focal liver abnormality is seen. No gallstones, gallbladder wall thickening, or biliary dilatation. Pancreas: Unremarkable. No pancreatic ductal dilatation or surrounding inflammatory changes. Spleen: Normal in size without focal abnormality. Adrenals/Urinary Tract: No adrenal nodule. Bilateral renal parenchymal atrophy. Bilateral renal cysts. No further follow-up imaging is recommended. Slight extrarenal pelvis configuration of the left kidney. No frank hydronephrosis. The urinary bladder is partially distended. Streak artifact from left hip arthroplasty obscures detailed bladder assessment. Stomach/Bowel: Decompressed stomach. Scattered fecalization of small bowel contents. No obstruction or inflammatory change. Colonic diverticulosis without diverticulitis. Suspected mural hypertrophy of the sigmoid. Normal appendix visualized. Vascular/Lymphatic: Dense aortic and branch atherosclerosis. No aortic aneurysm. The portal vein is patent. No enlarged lymph  nodes in the abdomen or pelvis. Reproductive: Prostate is unremarkable. Other: Peritoneal dialysis catheter from a left-sided approach. The tip is looped in the left mid abdomen. There is a small amount of free fluid in the pelvis, diminished from prior exam. Single focus of free air in the upper abdomen is not unexpected in the setting of peritoneal dialysis. No inguinal hernia. Musculoskeletal: Right L5 pars defect. No suspicious bone lesion. Left hip arthroplasty IMPRESSION: 1. No acute abnormality in the abdomen/pelvis. Diminished bladder wall thickening from prior. 2. Peritoneal dialysis catheter from a left-sided approach. Small amount of free fluid in the pelvis, diminished from prior exam. 3. Colonic diverticulosis. Suspected mural hypertrophy of the sigmoid colon, can be seen with chronic inflammation, but no active diverticulitis. Aortic Atherosclerosis (ICD10-I70.0). Electronically Signed   By: Narda Rutherford M.D.   On: 04/22/2023 16:41   CT Angio Chest PE W/Cm &/Or Wo Cm Result Date: 04/22/2023 CLINICAL DATA:  High probability for PE.  Bladder cancer. EXAM: CT ANGIOGRAPHY CHEST WITH CONTRAST TECHNIQUE: Multidetector CT imaging of the chest was performed using the standard protocol during bolus administration of intravenous contrast. Multiplanar CT image reconstructions and MIPs were obtained to evaluate the vascular anatomy. RADIATION DOSE REDUCTION: This exam was performed according to the departmental dose-optimization program which includes automated exposure control, adjustment of the mA and/or kV according to patient size and/or use of iterative reconstruction technique. CONTRAST:  OMNIPAQUE IOHEXOL 350 MG/ML SOLN COMPARISON:  CT chest abdomen and pelvis 04/18/2022 FINDINGS: Cardiovascular: Satisfactory opacification of the pulmonary arteries to the segmental level. No evidence of pulmonary embolism. Normal heart size. No pericardial effusion. There are atherosclerotic calcifications of the aorta. Mediastinum/Nodes: Thyroid gland is mildly diffusely heterogeneous. There are no enlarged mediastinal or hilar lymph nodes. Esophagus is within normal limits. Lungs/Pleura: Mild emphysematous changes are present. There is scarring in both lung apices. There is a band of atelectasis in the right lower lobe, unchanged. There is no new focal lung consolidation, pleural effusion or pneumothorax. Upper Abdomen: No acute abnormality. Musculoskeletal: No chest wall abnormality. No acute or significant osseous findings. Review of the MIP images confirms the above findings. IMPRESSION: 1. No evidence for pulmonary embolism. 2. Stable band of atelectasis in the right lower lobe. Aortic Atherosclerosis (ICD10-I70.0) and Emphysema (ICD10-J43.9). Electronically Signed   By: Darliss Cheney M.D.   On: 04/22/2023 16:41   DG Chest 2 View Result Date: 04/22/2023 CLINICAL DATA:  Shortness of breath. EXAM: CHEST - 2 VIEW  COMPARISON:  03/15/2023 FINDINGS: Linear areas of atelectasis/scarring noted at the lung bases, right more than left. Bilateral lung fields are otherwise clear. No dense consolidation or lung collapse. Bilateral costophrenic angles are clear. Normal cardio-mediastinal silhouette. No acute osseous abnormalities. The soft tissues are within normal limits. IMPRESSION: No active cardiopulmonary disease. Electronically Signed   By: Jules Schick M.D.   On: 04/22/2023 13:58    Family Communication: Discussed with patient, he understand and agree. All questions answered.  Disposition: Status is: Inpatient Remains inpatient appropriate because: GI work up, H/H monitoring  Planned Discharge Destination: Home     Time spent: 39 minutes  Author: Marcelino Duster, MD 04/23/2023 2:40 PM Secure chat 7am to 7pm For on call review [email or web address].

## 2023-04-23 NOTE — Progress Notes (Signed)
 [facility]  ROUNDING NOTE   Subjective:   Casey French is a 73 y.o. male with past medical conditions including hypertension, hyperlipidemia, PAD, gout, type 2 diabetes, paroxysmal atrial fibrillation on Eliquis, and end-stage renal disease on peritoneal dialysis.  Patient presents to the emergency department for evaluation of shortness of breath and weakness and has been admitted for Weakness [R53.1] SOB (shortness of breath) [R06.02] Acute GI bleeding [K92.2] Lower abdominal pain [R10.30] Gastrointestinal hemorrhage, unspecified gastrointestinal hemorrhage type [K92.2] Hematuria, unspecified type [R31.9] Nausea and vomiting, unspecified vomiting type [R11.2]  Patient is known to our practice and is followed out of [facility] by Dr. Wynelle Link.  Patient states he has been experiencing increased weakness and shortness of breath for few days.  Does report dark stools for the past 2 days.  Labs on ED arrival concerning for serum bicarb 16, BUN 53, creatinine 7.10 with GFR 8, albumin 2.3, BNP 168, hemoglobin 7.2 with elevated white count 11.0.  INR 1.4.No acute findings seen on chest x-ray.  CT abdomen pelvis negative for acute findings, confirms proper placement of peritoneal dialysis catheter and presence of colonic diverticulosis without diverticulitis.  CT angio chest with and without contrast shows no evidence of pulmonary embolism.  Patient received blood transfusion in emergency department and GI has been consulted.  We have been consulted to maintain dialysis needs during this admission.   Objective:  Vital signs in last 24 hours:  Temp:  [97.7 F (36.5 C)-98.3 F (36.8 C)] 97.9 F (36.6 C) (03/29 0933) Pulse Rate:  [66-81] 71 (03/29 0933) Resp:  [16-21] 18 (03/29 0524) BP: (93-116)/(52-71) 102/61 (03/29 0933) SpO2:  [94 %-100 %] 98 % (03/29 0933) Weight:  [58.6 kg-60.8 kg] 60.8 kg (03/29 0524)  Weight change:  Filed Weights   04/22/23 2114 04/23/23 0046  04/23/23 0524  Weight: 58.6 kg 59.5 kg 60.8 kg    Intake/Output: I/O last 3 completed shifts: In: 970.6 [I.V.:132.6; Blood:338; IV Piggyback:500] Out: 50 [Urine:50]   Intake/Output this shift:  Total I/O In: -  Out: 126 [Other:126]  Physical Exam: General: NAD,   Head: Normocephalic, atraumatic. Moist oral mucosal membranes  Eyes: Anicteric  Neck: Supple  Lungs:  Clear to auscultation  Heart: Regular rate and rhythm  Abdomen:  Soft, nontender, PDC  Extremities: Trace peripheral edema.  Neurologic: Alert, moving all four extremities  Skin: No lesions  Access: PD catheter    Basic Metabolic Panel: Recent Labs  Lab 04/22/23 1206 04/23/23 0503  NA 135 135  K 4.2 3.4*  CL 103 102  CO2 16* 23  GLUCOSE 107* 99  BUN 53* 49*  CREATININE 7.10* 6.83*  CALCIUM 7.8* 7.4*  MG  --  1.5*  PHOS  --  6.0*    Liver Function Tests: Recent Labs  Lab 04/22/23 1206  AST 19  ALT 10  ALKPHOS 99  BILITOT 0.5  PROT 5.5*  ALBUMIN 2.3*   No results for input(s): "LIPASE", "AMYLASE" in the last 168 hours. No results for input(s): "AMMONIA" in the last 168 hours.  CBC: Recent Labs  Lab 04/22/23 1206 04/23/23 0503  WBC 11.0* 7.3  HGB 7.2* 7.7*  HCT 23.1* 22.5*  MCV 100.9* 92.2  PLT 500* 381    Cardiac Enzymes: No results for input(s): "CKTOTAL", "CKMB", "CKMBINDEX", "TROPONINI" in the last 168 hours.  BNP: Invalid input(s): "POCBNP"  CBG: No results for input(s): "GLUCAP" in the last 168 hours.  Microbiology: Results for orders placed or performed in visit on 03/08/23  CULTURE, URINE COMPREHENSIVE     Status: None   Collection Time: 03/08/23  4:40 PM   Specimen: Urine   UR  Result Value Ref Range Status   Urine Culture, Comprehensive Final report  Final   Organism ID, Bacteria Comment  Final    Comment: No growth in 36 - 48 hours.  Microscopic Examination     Status: Abnormal   Collection Time: 03/08/23  4:40 PM   Urine  Result Value Ref Range Status    WBC, UA 0-5 0 - 5 /hpf Final   RBC, Urine 11-30 (A) 0 - 2 /hpf Final   Epithelial Cells (non renal) 0-10 0 - 10 /hpf Final   Casts Present (A) None seen /lpf Final   Cast Type Hyaline casts N/A Final   Mucus, UA Present (A) Not Estab. Final   Bacteria, UA Few None seen/Few Final    Coagulation Studies: Recent Labs    04/22/23 1704  LABPROT 17.7*  INR 1.4*    Urinalysis: Recent Labs    04/22/23 1952  COLORURINE YELLOW*  LABSPEC 1.020  PHURINE 7.0  GLUCOSEU 150*  HGBUR MODERATE*  BILIRUBINUR NEGATIVE  KETONESUR NEGATIVE  PROTEINUR >=300*  NITRITE NEGATIVE  LEUKOCYTESUR NEGATIVE      Imaging: US RENAL Result Date: 04/22/2023 CLINICAL DATA:  Hematuria.  History of bladder cancer. EXAM: RENAL / URINARY TRACT ULTRASOUND COMPLETE COMPARISON:  CT 04/22/2023 FINDINGS: Right Kidney: Renal measurements: 8.2 x 3.2 x 3.4 cm = volume: 46 mL. 2.5 cm lower pole cyst. Cortical thinning with increased echotexture. No hydronephrosis. Left Kidney: Renal measurements: 5.9 x 3.3 x 2.6 cm = volume: 26 mL. Not well visualized. Increased echotexture. No hydronephrosis. Bladder: Appears normal for degree of bladder distention. Other: None. IMPRESSION: Atrophic kidneys bilaterally with increased echotexture compatible with chronic medical renal disease. No hydronephrosis. Electronically Signed   By: Charlett Nose M.D.   On: 04/22/2023 19:08   CT ABDOMEN PELVIS W CONTRAST Result Date: 04/22/2023 CLINICAL DATA:  Lower abdominal pain. Weakness. End-stage renal disease on peritoneal dialysis, history of bladder cancer. EXAM: CT ABDOMEN AND PELVIS WITH CONTRAST TECHNIQUE: Multidetector CT imaging of the abdomen and pelvis was performed using the standard protocol following bolus administration of intravenous contrast. RADIATION DOSE REDUCTION: This exam was performed according to the departmental dose-optimization program which includes automated exposure control, adjustment of the mA and/or kV according to  patient size and/or use of iterative reconstruction technique. CONTRAST:  OMNIPAQUE IOHEXOL 350 MG/ML SOLN COMPARISON:  CT 6 days ago 04/16/2023 FINDINGS: Lower chest: No acute findings. Hepatobiliary: No focal liver abnormality is seen. No gallstones, gallbladder wall thickening, or biliary dilatation. Pancreas: Unremarkable. No pancreatic ductal dilatation or surrounding inflammatory changes. Spleen: Normal in size without focal abnormality. Adrenals/Urinary Tract: No adrenal nodule. Bilateral renal parenchymal atrophy. Bilateral renal cysts. No further follow-up imaging is recommended. Slight extrarenal pelvis configuration of the left kidney. No frank hydronephrosis. The urinary bladder is partially distended. Streak artifact from left hip arthroplasty obscures detailed bladder assessment. Stomach/Bowel: Decompressed stomach. Scattered fecalization of small bowel contents. No obstruction or inflammatory change. Colonic diverticulosis without diverticulitis. Suspected mural hypertrophy of the sigmoid. Normal appendix visualized. Vascular/Lymphatic: Dense aortic and branch atherosclerosis. No aortic aneurysm. The portal vein is patent. No enlarged lymph nodes in the abdomen or pelvis. Reproductive: Prostate is unremarkable. Other: Peritoneal dialysis catheter from a left-sided approach. The tip is looped in the left mid abdomen. There is a small amount of free fluid in the pelvis, diminished  from prior exam. Single focus of free air in the upper abdomen is not unexpected in the setting of peritoneal dialysis. No inguinal hernia. Musculoskeletal: Right L5 pars defect. No suspicious bone lesion. Left hip arthroplasty IMPRESSION: 1. No acute abnormality in the abdomen/pelvis. Diminished bladder wall thickening from prior. 2. Peritoneal dialysis catheter from a left-sided approach. Small amount of free fluid in the pelvis, diminished from prior exam. 3. Colonic diverticulosis. Suspected mural hypertrophy of the  sigmoid colon, can be seen with chronic inflammation, but no active diverticulitis. Aortic Atherosclerosis (ICD10-I70.0). Electronically Signed   By: Narda Rutherford M.D.   On: 04/22/2023 16:41   CT Angio Chest PE W/Cm &/Or Wo Cm Result Date: 04/22/2023 CLINICAL DATA:  High probability for PE.  Bladder cancer. EXAM: CT ANGIOGRAPHY CHEST WITH CONTRAST TECHNIQUE: Multidetector CT imaging of the chest was performed using the standard protocol during bolus administration of intravenous contrast. Multiplanar CT image reconstructions and MIPs were obtained to evaluate the vascular anatomy. RADIATION DOSE REDUCTION: This exam was performed according to the departmental dose-optimization program which includes automated exposure control, adjustment of the mA and/or kV according to patient size and/or use of iterative reconstruction technique. CONTRAST:  OMNIPAQUE IOHEXOL 350 MG/ML SOLN COMPARISON:  CT chest abdomen and pelvis 04/18/2022 FINDINGS: Cardiovascular: Satisfactory opacification of the pulmonary arteries to the segmental level. No evidence of pulmonary embolism. Normal heart size. No pericardial effusion. There are atherosclerotic calcifications of the aorta. Mediastinum/Nodes: Thyroid gland is mildly diffusely heterogeneous. There are no enlarged mediastinal or hilar lymph nodes. Esophagus is within normal limits. Lungs/Pleura: Mild emphysematous changes are present. There is scarring in both lung apices. There is a band of atelectasis in the right lower lobe, unchanged. There is no new focal lung consolidation, pleural effusion or pneumothorax. Upper Abdomen: No acute abnormality. Musculoskeletal: No chest wall abnormality. No acute or significant osseous findings. Review of the MIP images confirms the above findings. IMPRESSION: 1. No evidence for pulmonary embolism. 2. Stable band of atelectasis in the right lower lobe. Aortic Atherosclerosis (ICD10-I70.0) and Emphysema (ICD10-J43.9). Electronically  Signed   By: Darliss Cheney M.D.   On: 04/22/2023 16:41   DG Chest 2 View Result Date: 04/22/2023 CLINICAL DATA:  Shortness of breath. EXAM: CHEST - 2 VIEW COMPARISON:  03/15/2023 FINDINGS: Linear areas of atelectasis/scarring noted at the lung bases, right more than left. Bilateral lung fields are otherwise clear. No dense consolidation or lung collapse. Bilateral costophrenic angles are clear. Normal cardio-mediastinal silhouette. No acute osseous abnormalities. The soft tissues are within normal limits. IMPRESSION: No active cardiopulmonary disease. Electronically Signed   By: Jules Schick M.D.   On: 04/22/2023 13:58     Medications:    sodium chloride 120 mL/hr at 04/22/23 2316   dialysis solution 1.5% low-MG/low-CA      allopurinol  100 mg Oral Daily   calcitRIOL  0.25 mcg Oral Daily   escitalopram  10 mg Oral BH-q7a   feeding supplement  1 Container Oral TID BM   folic acid  1 mg Oral Daily   gabapentin  300 mg Oral QHS   gentamicin cream  1 Application Topical Daily   pantoprazole (PROTONIX) IV  40 mg Intravenous Q12H   rosuvastatin  40 mg Oral BH-q7a   sodium bicarbonate  650 mg Oral TID   sodium chloride flush  3 mL Intravenous Q12H   sodium chloride flush  3-10 mL Intravenous Q12H   sodium chloride, acetaminophen **OR** acetaminophen, HYDROcodone-acetaminophen, meclizine, methocarbamol, ondansetron **OR** ondansetron (  ZOFRAN) IV, mouth rinse, sodium chloride flush, sodium chloride flush  Assessment/ Plan:  Mr. ZORIAN GUNDERMAN is a 73 y.o.  male with past medical conditions including hypertension, hyperlipidemia, PAD, gout, type 2 diabetes, paroxysmal atrial fibrillation on Eliquis, and end-stage renal disease on peritoneal dialysis.  Patient presents to the emergency department for evaluation of shortness of breath and weakness and has been admitted for Weakness [R53.1] SOB (shortness of breath) [R06.02] Acute GI bleeding [K92.2] Lower abdominal pain  [R10.30] Gastrointestinal hemorrhage, unspecified gastrointestinal hemorrhage type [K92.2] Hematuria, unspecified type [R31.9] Nausea and vomiting, unspecified vomiting type [R11.2]   End-stage renal disease on peritoneal dialysis.  Patient did receive dialysis overnight last night, UF 126 mL.  Due to decreased hemoglobin, will continue gentle overnight dialysis treatments with 1.5% dialysate.  2. Anemia of chronic kidney disease with acute blood loss Lab Results  Component Value Date   HGB 7.7 (L) 04/23/2023  Hemoglobin 7.2 on ED arrival, patient has received blood transfusion.  Recent history of melena and hematuria.  GI consulted.  3. Secondary Hyperparathyroidism: with outpatient labs: PTH 960, phosphorus 7.1, calcium 7.4 on 03/28/23.   Lab Results  Component Value Date   CALCIUM 7.4 (L) 04/23/2023   CAION 0.95 (L) 04/05/2023   PHOS 6.0 (H) 04/23/2023    Will continue to monitor bone minerals during this admission. Continue calcitriol during this admission.  4. Hypertension with chronic kidney disease.  Home regimen includes amlodipine, losartan, and metoprolol.  All currently held.   LOS: 1 Mafalda Mcginniss 3/29/20253:30 PM

## 2023-04-23 NOTE — H&P (View-Only) (Signed)
 GI Inpatient Consult Note  Reason for Consult: GI bleed   Attending Requesting Consult: Dr. Gillis Santa, MD  History of Present Illness: Casey French is a 73 y.o. male seen for evaluation of GIB at the request of admitting hospitalist - Dr. Gillis Santa. Patient has a PMH of ESRD on PD, AOCD, PAF on chronic anticoagulation, HTN, HLD, T2DM, gout, hx of recent diagnosis of urinary bladder cancer s/p TURBT 04/05/23, depression, and CAD s/p PCI/DES 2003. He presented to the [facility] ED 3/28 via EMS from home for chief complaint of generalized weakness, acute lower abdominal pain, shortness of breath, nausea, and vomiting. He reported a 2-day history of dark stools for the past 2-days. Upon presentation to the ED, BP soft 96/61 and otherwise normal vital signs. Labs significant for WBC 11, hemoglobin 7.2, platelets 500K, bicarb 16, BUN 53, serum creatinine 7.1, anion gap 16, and albumin 2.3. CTA chest negative for PE. CT abd/pelvis showed no acute abnormality in abdomen or pelvis. He was admitted to the floor. Hemoglobin 1 month ago was low 9s. Per ED physician, dark brown stool in rectal vault which was guaiac positive. He received PPI bolus and orders for IV BID for possible GI bleeding. He was ordered to have 1 unit pRBCs by admitting hospitalist.   Patient seen and examined this morning resting comfortably in hospital bed. No acute events overnight. He reports last BM was in the ED while waiting to be seen. No gross hematochezia. He reports no current abdominal pain. He did receive peritoneal dialysis last night. He feels a little bit stronger. He reports last dose of Eliquis was on 3/22 last week after being seen in the ED for gross hematuria. He did take 1 Excedrin migraine pill this week. He takes baby aspirin daily. Repeat blood work this AM showed hemoglobin 7.7. He does endorse a 10-15-lb weight loss over the past 3 months which he attributes to cancer diagnosis and poor appetite. He denies  any heartburn or acid reflux symptoms. No recurrent vomiting. He denies any complaints of esophageal dysphagia, odynophagia, early satiety, hoarseness, or epigastric abdominal pain.   Summary of GI Procedures:  CSY 04/01/2017 Marva Panda) - fair colon prep, diverticulosis in sigmoid, descending, and transverse colon, two 2 mm hyperplastic polyps removed from rectum, Grade II IH  Past Medical History:  Past Medical History:  Diagnosis Date   Acute diverticulitis 06/14/2014   Acute ST elevation myocardial infarction (STEMI) of inferior wall (HCC) 2003   a.) PCI with Cypher stent to the RCA   Anemia of chronic renal failure    Aortic atherosclerosis (HCC)    Bladder tumor    CAP (community acquired pneumonia) 06/2022   Cerebral microvascular disease    Coronary artery disease involving native coronary artery of native heart without angina pectoris 2003   a.) PCI with Cypher stent to the RCA 2003; b.) LHC 08/2003: 100% dRCA, 60% pLAD - med mgmt; c.) LHC/PCI 08/21/2015: occluded RCA at site of prior stent placement, 70% mLAD (FFR 0.79; 3.5 x 12 mm Xience DES), 50% dLAD (FFR 0.70); d.) MV 11/11/2016: no ischemia   Depression    Diverticulosis    End-stage renal disease on peritoneal dialysis (HCC)    GERD (gastroesophageal reflux disease)    GI bleed 05/15/2019   Gout    Heart murmur    Hematemesis 05/15/2019   Hepatic cyst    Hepatomegaly    Hyperlipidemia    Hypertension    Lacunar infarction (HCC) 02/11/2007  a.) CT head 02/11/2007: lacunar infarction involving thalamus and external capsule region on the RIGHT; b.) MRI brain 02/03/2020: multiple remote lacunar infarcts involving the RIGHT basal ganglia, RIGHT thalamus, and LEFT middle cerebellar peduncle   Long-term use of aspirin therapy    Myocardial bridge    On apixaban therapy    Pancolitis (HCC) 09/2022   Paroxysmal atrial fibrillation (HCC)    a.) CHA2DS2VASc = 6 (age, HTN, CVA x 2, prior MI/vascular disease, T2DM) as of  03/11/2023; b.) rate/rhythm maintained on oral metoprolol succinate; chronically anticoagulated with dose reduced apixaban   Renal cyst, right    Sigmoid diverticulitis 03/2022   T2DM (type 2 diabetes mellitus) (HCC)    Traumatic skull fracture and resulting subarachnoid hemorrhage 03/24/2013   a.) MOI was slipping and falling on ice   Unstable angina [facility])     Problem List: Patient Active Problem List   Diagnosis Date Noted   Acute GI bleeding 04/22/2023   Acute colitis 10/07/2022   Elevated troponin 10/07/2022   High anion gap metabolic acidosis 10/07/2022   Acute metabolic encephalopathy 06/26/2022   CAP (community acquired pneumonia) 06/26/2022   Acute respiratory failure with hypoxia (HCC) 06/26/2022   Hypertensive urgency, malignant 06/26/2022   Acute pulmonary edema (HCC) 06/26/2022   Coag negative Staphylococcus bacteremia 04/22/2022   Diverticulitis 04/18/2022   Hypokalemia 04/18/2022   Hyperkalemia 03/08/2021   Peritoneal dialysis catheter dysfunction (HCC) ESRD on peritoneal dialysis 03/07/2021   PAF (paroxysmal atrial fibrillation) (HCC) on chronic anticoagulation 03/07/2021   Chest pain 12/02/2020   HTN (hypertension) 12/02/2020   HLD (hyperlipidemia) 12/02/2020   Gout 12/02/2020   ESRD on peritoneal dialysis (HCC) 12/02/2020   B12 deficiency 07/10/2020   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   S/P hip replacement 04/22/2020   Hospital discharge follow-up 02/19/2020   Iron deficiency anemia 02/19/2020   Preop cardiovascular exam 01/09/2020   Anemia in chronic kidney disease 11/15/2019   Benign hypertensive kidney disease with chronic kidney disease 11/15/2019   Chronic kidney disease, stage 3b (HCC) 11/15/2019   Proteinuria 11/15/2019   Hematochezia 05/15/2019   Chronic anticoagulation 05/15/2019   Atrial fibrillation, chronic (HCC) 05/15/2019   Chronic renal disease, stage IV (HCC) 05/15/2019   Unstable angina (HCC) 11/10/2016   S/P cardiac  catheterization 09/09/2015   Angina at rest [facility]) 05/01/2015   Hypertensive urgency 06/14/2014   AKI (acute kidney injury) (HCC) 06/14/2014   Benign essential HTN 06/14/2014   Hyperlipidemia 06/14/2014   DM type 2 (diabetes mellitus, type 2) (HCC) 06/14/2014   CAD S/P percutaneous coronary angioplasty 06/14/2014   GERD without esophagitis 06/14/2014   Acute kidney failure (HCC) 06/14/2014   Tremor 03/18/2014   Fall at home 08/30/2013   SOB (shortness of breath) on exertion 08/30/2013   Closed head injury 03/24/2013   Unspecified injury of head, initial encounter 03/24/2013    Past Surgical History: Past Surgical History:  Procedure Laterality Date   BLADDER INSTILLATION N/A 04/05/2023   Procedure: BLADDER INSTILLATION OF GEMCITABINE;  Surgeon: Riki Altes, MD;  Location: [facility] ORS;  Service: Urology;  Laterality: N/A;   CAPD INSERTION N/A 08/13/2020   Procedure: LAPAROSCOPIC INSERTION CONTINUOUS AMBULATORY PERITONEAL DIALYSIS  (CAPD) CATHETER;  Surgeon: Campbell Lerner, MD;  Location: [facility] ORS;  Service: General;  Laterality: N/A;   CARDIAC CATHETERIZATION     CATARACT EXTRACTION W/PHACO Right 07/05/2019   Procedure: CATARACT EXTRACTION PHACO AND INTRAOCULAR LENS PLACEMENT (IOC) RIGHT DIABETIC;  Surgeon: Galen Manila, MD;  Location: [facility] ORS;  Service:  Ophthalmology;  Laterality: Right;  US00:59.3 CDE8.41 LOT [id number] h   COLONOSCOPY WITH PROPOFOL N/A 04/01/2017   Procedure: COLONOSCOPY WITH PROPOFOL;  Surgeon: Christena Deem, MD;  [facility] END[facility]COPY;  Service: Endoscopy;  Laterality: N/A;   CORONARY ANGIOPLASTY WITH STENT PLACEMENT  10/2001   RCA   CORONARY ANGIOPLASTY WITH STENT PLACEMENT Left 08/21/2015   Procedure: CORONARY ANGIOPLASTY WITH STENT PLACEMENT (mid LAD); Location: [facility]; Surgeon: Willene Hatchet, MD   HAND SURGERY  2015   3 screws in right hand   TOTAL HIP ARTHROPLASTY Left 04/22/2020   Procedure: TOTAL HIP ARTHROPLASTY ANTERIOR APPROACH;   Surgeon: Kennedy Bucker, MD;  Location: [facility] ORS;  Service: Orthopedics;  Laterality: Left;   TRANSURETHRAL RESECTION OF BLADDER TUMOR N/A 04/05/2023   Procedure: TURBT (TRANSURETHRAL RESECTION OF BLADDER TUMOR);  Surgeon: Riki Altes, MD;  Location: [facility] ORS;  Service: Urology;  Laterality: N/A;    Allergies: Allergies  Allergen Reactions   Morphine Anxiety    agitation     Home Medications: Medications Prior to Admission  Medication Sig Dispense Refill Last Dose/Taking   acetaminophen (TYLENOL) 325 MG tablet Take 2 tablets (650 mg total) by mouth every 6 (six) hours as needed for mild pain (or Fever >/= 101).   Taking As Needed   amLODipine (NORVASC) 10 MG tablet Take 1 tablet (10 mg total) by mouth daily. (Patient taking differently: Take 10 mg by mouth every morning.) 30 tablet 1 04/22/2023   apixaban (ELIQUIS) 2.5 MG TABS tablet Take 2.5 mg by mouth 2 (two) times daily.   Past Week   aspirin 81 MG EC tablet Take 81 mg by mouth daily.   04/22/2023   gabapentin (NEURONTIN) 300 MG capsule Take 300 mg by mouth at bedtime.  11 Taking   HYDROcodone-acetaminophen (NORCO/VICODIN) 5-325 MG tablet Take 1 tablet by mouth every 6 (six) hours as needed for moderate pain (pain score 4-6). 6 tablet 0 Past Week   hydrocortisone (ANUSOL-HC) 2.5 % rectal cream Place 1 application rectally 2 (two) times daily as needed for hemorrhoids or anal itching.   Taking As Needed   losartan (COZAAR) 100 MG tablet Take 1 tablet (100 mg total) by mouth daily. 30 tablet 1 04/22/2023   methocarbamol (ROBAXIN) 500 MG tablet Take 500 mg by mouth 3 (three) times daily as needed.   Taking As Needed   metoprolol succinate (TOPROL-XL) 100 MG 24 hr tablet Take 100 mg by mouth every morning. Take with or immediately following a meal.   04/22/2023   nitroGLYCERIN (NITROSTAT) 0.4 MG SL tablet Place 0.4 mg under the tongue every 5 (five) minutes as needed for chest pain.   Taking As Needed   omeprazole (PRILOSEC) 20 MG capsule  Take 20 mg by mouth every morning.   04/22/2023   ondansetron (ZOFRAN) 4 MG tablet Take 4 mg by mouth every 8 (eight) hours as needed for nausea or vomiting.   Taking As Needed   psyllium (METAMUCIL) 58.6 % packet Take 1 packet by mouth 2 (two) times daily.   Past Week   rosuvastatin (CRESTOR) 40 MG tablet Take 40 mg by mouth every morning.   04/22/2023   Vitamin D, Ergocalciferol, (DRISDOL) 1.25 MG (50000 UNIT) CAPS capsule Take 50,000 Units by mouth once a week.   Past Week   allopurinol (ZYLOPRIM) 100 MG tablet Take 100 mg by mouth daily. (Patient not taking: Reported on 04/23/2023)   Not Taking   calcitRIOL (ROCALTROL) 0.25 MCG capsule Take 1 capsule (0.25 mcg total)  by mouth daily. (Patient not taking: Reported on 04/22/2023) 30 capsule 2 Not Taking   escitalopram (LEXAPRO) 10 MG tablet Take 10 mg by mouth every morning. (Patient not taking: Reported on 04/22/2023)   Not Taking   folic acid (FOLVITE) 1 MG tablet Take 1 tablet (1 mg total) by mouth daily. Take 1 tablet daily (Patient not taking: Reported on 04/22/2023)   Not Taking   isosorbide mononitrate (IMDUR) 60 MG 24 hr tablet Take 60 mg by mouth every morning. (Patient not taking: Reported on 04/22/2023)   Not Taking   meclizine (ANTIVERT) 25 MG tablet Take 1 tablet (25 mg total) by mouth 2 (two) times daily. (Patient not taking: Reported on 04/23/2023) 30 tablet 0 Not Taking   ondansetron (ZOFRAN-ODT) 4 MG disintegrating tablet Take 1 tablet (4 mg total) by mouth every 8 (eight) hours as needed. (Patient not taking: Reported on 04/23/2023) 20 tablet 0 Not Taking   Home medication reconciliation was completed with the patient.   Scheduled Inpatient Medications:    allopurinol  100 mg Oral Daily   calcitRIOL  0.25 mcg Oral Daily   escitalopram  10 mg Oral BH-q7a   feeding supplement  1 Container Oral TID BM   folic acid  1 mg Oral Daily   gabapentin  300 mg Oral QHS   gentamicin cream  1 Application Topical Daily   pantoprazole (PROTONIX) IV   40 mg Intravenous Q12H   rosuvastatin  40 mg Oral BH-q7a   sodium bicarbonate  650 mg Oral TID   sodium chloride flush  3 mL Intravenous Q12H    Continuous Inpatient Infusions:    sodium chloride 120 mL/hr at 04/22/23 2316   dialysis solution 1.5% low-MG/low-CA      PRN Inpatient Medications:  sodium chloride, acetaminophen **OR** acetaminophen, HYDROcodone-acetaminophen, meclizine, methocarbamol, ondansetron **OR** ondansetron (ZOFRAN) IV, mouth rinse, sodium chloride flush  Family History: family history includes CAD in an other family member; Heart disease in his father and mother.  The patient's family history is negative for inflammatory bowel disorders, GI malignancy, or solid organ transplantation.  Social History:   reports that he quit smoking about 22 years ago. His smoking use included cigarettes. He has never used smokeless tobacco. He reports that he does not drink alcohol and does not use drugs. The patient denies ETOH, tobacco, or drug use.   Review of Systems: Constitutional: + weight loss Eyes: No changes in vision. ENT: No oral lesions, sore throat.  GI: see HPI.  Heme/Lymph: No easy bruising.  CV: No chest pain.  GU: + hematuria  Integumentary: No rashes.  Neuro: + headache Psych: + depression + anxiety Endocrine: No heat/cold intolerance.  Allergic/Immunologic: No urticaria.  Resp: No cough, SOB.  Musculoskeletal: No joint swelling.    Physical Examination: BP 114/71 (BP Location: Right Arm)   Pulse 71   Temp 98.3 F (36.8 C)   Resp 18   Ht 5\' 9"  (1.753 m)   Wt 60.8 kg   SpO2 100%   BMI 19.79 kg/m  Gen: NAD, alert and oriented x 4 HEENT: PEERLA, EOMI, Neck: supple, no JVD or thyromegaly Chest: CTA bilaterally, no wheezes, crackles, or other adventitious sounds CV: RRR, no m/g/c/r Abd: soft, NT, ND, +BS in all four quadrants; no HSM, guarding, ridigity, or rebound tenderness Ext: no edema, well perfused with 2+ pulses, Skin: no rash or  lesions noted Lymph: no LAD  Data: Lab Results  Component Value Date   WBC 7.3 04/23/2023  HGB 7.7 (L) 04/23/2023   HCT 22.5 (L) 04/23/2023   MCV 92.2 04/23/2023   PLT 381 04/23/2023   Recent Labs  Lab 04/16/23 1459 04/22/23 1206 04/23/23 0503  HGB 8.9* 7.2* 7.7*   Lab Results  Component Value Date   NA 135 04/23/2023   K 3.4 (L) 04/23/2023   CL 102 04/23/2023   CO2 23 04/23/2023   BUN 49 (H) 04/23/2023   CREATININE 6.83 (H) 04/23/2023   Lab Results  Component Value Date   ALT 10 04/22/2023   AST 19 04/22/2023   ALKPHOS 99 04/22/2023   BILITOT 0.5 04/22/2023   Recent Labs  Lab 04/22/23 1704  APTT 40*  INR 1.4*    Assessment/Plan:  73 y/o Caucasian male with a PMH of ESRD on PD, AOCD, PAF on chronic anticoagulation, HTN, HLD, T2DM, gout, hx of urinary bladder cancer s/p chemotherapy, depression, and CAD s/p PCI/DES 2003 presented to the [facility] ED 3/28 for chief complaint of generalized weakness, shortness of breath, lower abdominal pain, nausea, and vomiting. He was found to have anemia decreased from baseline hemoglobin 9s with reports of 2-day history of black stools concerning for GI blood loss. GI consulted for further evaluation and management.   GIB/Anemia - suspect UGI source. Differential includes peptic ulcer disease, gastritis +/- H pylori, erosive esophagitis, duodenitis, AVMs, malignancy, GAVE, or less likely small bowel/colon course.   Acute lower abdominal pain - CT chest/abd/pelvis negative, resolved   Hematuria - hx of recent dx bladder cancer s/p TURBT 04/05/23  ESRD on PD  PAF on chronic anticoagulation - last dose Eliquis 3/22 last week  Depression  Recommendations:  - Maintain 2 large bore IVs for access - Continue to monitor serial H&H. Transfuse for Hgb <7.0.  - No signs of overt GI bleeding. Continue to monitor for signs of GIB.  - Continue PPI IV BID for gastric protection - Supportive care per primary team - Continue PD nightly -  Correct electrolytes PRN per primary team - Continue to hold all anticoagulation/antiplatelet therapy - Full liquid diet. NPO midnight.  - Recommend EGD for endoluminal evaluation. Will plan for EGD tomorrow AM with Dr. Norma Fredrickson.  - See procedure note for findings and further recommendations - GI following along with you  I reviewed the risks (including bleeding, perforation, infection, anesthesia complications, cardiac/respiratory complications), benefits and alternatives of EGD. Patient consents to proceed.    Thank you for the consult. Please call with questions or concerns.  Gilda Crease, PA-C [facility] [phone]

## 2023-04-23 NOTE — Consult Note (Signed)
 GI Inpatient Consult Note  Reason for Consult: GI bleed   Attending Requesting Consult: Dr. Gillis Santa, MD  History of Present Illness: Casey French is a 73 y.o. male seen for evaluation of GIB at the request of admitting hospitalist - Dr. Gillis Santa. Patient has a PMH of ESRD on PD, AOCD, PAF on chronic anticoagulation, HTN, HLD, T2DM, gout, hx of recent diagnosis of urinary bladder cancer s/p TURBT 04/05/23, depression, and CAD s/p PCI/DES 2003. He presented to the [facility] ED 3/28 via EMS from home for chief complaint of generalized weakness, acute lower abdominal pain, shortness of breath, nausea, and vomiting. He reported a 2-day history of dark stools for the past 2-days. Upon presentation to the ED, BP soft 96/61 and otherwise normal vital signs. Labs significant for WBC 11, hemoglobin 7.2, platelets 500K, bicarb 16, BUN 53, serum creatinine 7.1, anion gap 16, and albumin 2.3. CTA chest negative for PE. CT abd/pelvis showed no acute abnormality in abdomen or pelvis. He was admitted to the floor. Hemoglobin 1 month ago was low 9s. Per ED physician, dark brown stool in rectal vault which was guaiac positive. He received PPI bolus and orders for IV BID for possible GI bleeding. He was ordered to have 1 unit pRBCs by admitting hospitalist.   Patient seen and examined this morning resting comfortably in hospital bed. No acute events overnight. He reports last BM was in the ED while waiting to be seen. No gross hematochezia. He reports no current abdominal pain. He did receive peritoneal dialysis last night. He feels a little bit stronger. He reports last dose of Eliquis was on 3/22 last week after being seen in the ED for gross hematuria. He did take 1 Excedrin migraine pill this week. He takes baby aspirin daily. Repeat blood work this AM showed hemoglobin 7.7. He does endorse a 10-15-lb weight loss over the past 3 months which he attributes to cancer diagnosis and poor appetite. He denies  any heartburn or acid reflux symptoms. No recurrent vomiting. He denies any complaints of esophageal dysphagia, odynophagia, early satiety, hoarseness, or epigastric abdominal pain.   Summary of GI Procedures:  CSY 04/01/2017 Marva Panda) - fair colon prep, diverticulosis in sigmoid, descending, and transverse colon, two 2 mm hyperplastic polyps removed from rectum, Grade II IH  Past Medical History:  Past Medical History:  Diagnosis Date   Acute diverticulitis 06/14/2014   Acute ST elevation myocardial infarction (STEMI) of inferior wall (HCC) 2003   a.) PCI with Cypher stent to the RCA   Anemia of chronic renal failure    Aortic atherosclerosis (HCC)    Bladder tumor    CAP (community acquired pneumonia) 06/2022   Cerebral microvascular disease    Coronary artery disease involving native coronary artery of native heart without angina pectoris 2003   a.) PCI with Cypher stent to the RCA 2003; b.) LHC 08/2003: 100% dRCA, 60% pLAD - med mgmt; c.) LHC/PCI 08/21/2015: occluded RCA at site of prior stent placement, 70% mLAD (FFR 0.79; 3.5 x 12 mm Xience DES), 50% dLAD (FFR 0.70); d.) MV 11/11/2016: no ischemia   Depression    Diverticulosis    End-stage renal disease on peritoneal dialysis (HCC)    GERD (gastroesophageal reflux disease)    GI bleed 05/15/2019   Gout    Heart murmur    Hematemesis 05/15/2019   Hepatic cyst    Hepatomegaly    Hyperlipidemia    Hypertension    Lacunar infarction (HCC) 02/11/2007  a.) CT head 02/11/2007: lacunar infarction involving thalamus and external capsule region on the RIGHT; b.) MRI brain 02/03/2020: multiple remote lacunar infarcts involving the RIGHT basal ganglia, RIGHT thalamus, and LEFT middle cerebellar peduncle   Long-term use of aspirin therapy    Myocardial bridge    On apixaban therapy    Pancolitis (HCC) 09/2022   Paroxysmal atrial fibrillation (HCC)    a.) CHA2DS2VASc = 6 (age, HTN, CVA x 2, prior MI/vascular disease, T2DM) as of  03/11/2023; b.) rate/rhythm maintained on oral metoprolol succinate; chronically anticoagulated with dose reduced apixaban   Renal cyst, right    Sigmoid diverticulitis 03/2022   T2DM (type 2 diabetes mellitus) (HCC)    Traumatic skull fracture and resulting subarachnoid hemorrhage 03/24/2013   a.) MOI was slipping and falling on ice   Unstable angina [facility])     Problem List: Patient Active Problem List   Diagnosis Date Noted   Acute GI bleeding 04/22/2023   Acute colitis 10/07/2022   Elevated troponin 10/07/2022   High anion gap metabolic acidosis 10/07/2022   Acute metabolic encephalopathy 06/26/2022   CAP (community acquired pneumonia) 06/26/2022   Acute respiratory failure with hypoxia (HCC) 06/26/2022   Hypertensive urgency, malignant 06/26/2022   Acute pulmonary edema (HCC) 06/26/2022   Coag negative Staphylococcus bacteremia 04/22/2022   Diverticulitis 04/18/2022   Hypokalemia 04/18/2022   Hyperkalemia 03/08/2021   Peritoneal dialysis catheter dysfunction (HCC) ESRD on peritoneal dialysis 03/07/2021   PAF (paroxysmal atrial fibrillation) (HCC) on chronic anticoagulation 03/07/2021   Chest pain 12/02/2020   HTN (hypertension) 12/02/2020   HLD (hyperlipidemia) 12/02/2020   Gout 12/02/2020   ESRD on peritoneal dialysis (HCC) 12/02/2020   B12 deficiency 07/10/2020   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   S/P hip replacement 04/22/2020   Hospital discharge follow-up 02/19/2020   Iron deficiency anemia 02/19/2020   Preop cardiovascular exam 01/09/2020   Anemia in chronic kidney disease 11/15/2019   Benign hypertensive kidney disease with chronic kidney disease 11/15/2019   Chronic kidney disease, stage 3b (HCC) 11/15/2019   Proteinuria 11/15/2019   Hematochezia 05/15/2019   Chronic anticoagulation 05/15/2019   Atrial fibrillation, chronic (HCC) 05/15/2019   Chronic renal disease, stage IV (HCC) 05/15/2019   Unstable angina (HCC) 11/10/2016   S/P cardiac  catheterization 09/09/2015   Angina at rest [facility]) 05/01/2015   Hypertensive urgency 06/14/2014   AKI (acute kidney injury) (HCC) 06/14/2014   Benign essential HTN 06/14/2014   Hyperlipidemia 06/14/2014   DM type 2 (diabetes mellitus, type 2) (HCC) 06/14/2014   CAD S/P percutaneous coronary angioplasty 06/14/2014   GERD without esophagitis 06/14/2014   Acute kidney failure (HCC) 06/14/2014   Tremor 03/18/2014   Fall at home 08/30/2013   SOB (shortness of breath) on exertion 08/30/2013   Closed head injury 03/24/2013   Unspecified injury of head, initial encounter 03/24/2013    Past Surgical History: Past Surgical History:  Procedure Laterality Date   BLADDER INSTILLATION N/A 04/05/2023   Procedure: BLADDER INSTILLATION OF GEMCITABINE;  Surgeon: Riki Altes, MD;  Location: [facility] ORS;  Service: Urology;  Laterality: N/A;   CAPD INSERTION N/A 08/13/2020   Procedure: LAPAROSCOPIC INSERTION CONTINUOUS AMBULATORY PERITONEAL DIALYSIS  (CAPD) CATHETER;  Surgeon: Campbell Lerner, MD;  Location: [facility] ORS;  Service: General;  Laterality: N/A;   CARDIAC CATHETERIZATION     CATARACT EXTRACTION W/PHACO Right 07/05/2019   Procedure: CATARACT EXTRACTION PHACO AND INTRAOCULAR LENS PLACEMENT (IOC) RIGHT DIABETIC;  Surgeon: Galen Manila, MD;  Location: [facility] ORS;  Service:  Ophthalmology;  Laterality: Right;  US00:59.3 CDE8.41 LOT [id number] h   COLONOSCOPY WITH PROPOFOL N/A 04/01/2017   Procedure: COLONOSCOPY WITH PROPOFOL;  Surgeon: Christena Deem, MD;  [facility] ENDOSCOPY;  Service: Endoscopy;  Laterality: N/A;   CORONARY ANGIOPLASTY WITH STENT PLACEMENT  10/2001   RCA   CORONARY ANGIOPLASTY WITH STENT PLACEMENT Left 08/21/2015   Procedure: CORONARY ANGIOPLASTY WITH STENT PLACEMENT (mid LAD); Location: [facility]; Surgeon: Willene Hatchet, MD   HAND SURGERY  2015   3 screws in right hand   TOTAL HIP ARTHROPLASTY Left 04/22/2020   Procedure: TOTAL HIP ARTHROPLASTY ANTERIOR APPROACH;   Surgeon: Kennedy Bucker, MD;  Location: [facility] ORS;  Service: Orthopedics;  Laterality: Left;   TRANSURETHRAL RESECTION OF BLADDER TUMOR N/A 04/05/2023   Procedure: TURBT (TRANSURETHRAL RESECTION OF BLADDER TUMOR);  Surgeon: Riki Altes, MD;  Location: [facility] ORS;  Service: Urology;  Laterality: N/A;    Allergies: Allergies  Allergen Reactions   Morphine Anxiety    agitation     Home Medications: Medications Prior to Admission  Medication Sig Dispense Refill Last Dose/Taking   acetaminophen (TYLENOL) 325 MG tablet Take 2 tablets (650 mg total) by mouth every 6 (six) hours as needed for mild pain (or Fever >/= 101).   Taking As Needed   amLODipine (NORVASC) 10 MG tablet Take 1 tablet (10 mg total) by mouth daily. (Patient taking differently: Take 10 mg by mouth every morning.) 30 tablet 1 04/22/2023   apixaban (ELIQUIS) 2.5 MG TABS tablet Take 2.5 mg by mouth 2 (two) times daily.   Past Week   aspirin 81 MG EC tablet Take 81 mg by mouth daily.   04/22/2023   gabapentin (NEURONTIN) 300 MG capsule Take 300 mg by mouth at bedtime.  11 Taking   HYDROcodone-acetaminophen (NORCO/VICODIN) 5-325 MG tablet Take 1 tablet by mouth every 6 (six) hours as needed for moderate pain (pain score 4-6). 6 tablet 0 Past Week   hydrocortisone (ANUSOL-HC) 2.5 % rectal cream Place 1 application rectally 2 (two) times daily as needed for hemorrhoids or anal itching.   Taking As Needed   losartan (COZAAR) 100 MG tablet Take 1 tablet (100 mg total) by mouth daily. 30 tablet 1 04/22/2023   methocarbamol (ROBAXIN) 500 MG tablet Take 500 mg by mouth 3 (three) times daily as needed.   Taking As Needed   metoprolol succinate (TOPROL-XL) 100 MG 24 hr tablet Take 100 mg by mouth every morning. Take with or immediately following a meal.   04/22/2023   nitroGLYCERIN (NITROSTAT) 0.4 MG SL tablet Place 0.4 mg under the tongue every 5 (five) minutes as needed for chest pain.   Taking As Needed   omeprazole (PRILOSEC) 20 MG capsule  Take 20 mg by mouth every morning.   04/22/2023   ondansetron (ZOFRAN) 4 MG tablet Take 4 mg by mouth every 8 (eight) hours as needed for nausea or vomiting.   Taking As Needed   psyllium (METAMUCIL) 58.6 % packet Take 1 packet by mouth 2 (two) times daily.   Past Week   rosuvastatin (CRESTOR) 40 MG tablet Take 40 mg by mouth every morning.   04/22/2023   Vitamin D, Ergocalciferol, (DRISDOL) 1.25 MG (50000 UNIT) CAPS capsule Take 50,000 Units by mouth once a week.   Past Week   allopurinol (ZYLOPRIM) 100 MG tablet Take 100 mg by mouth daily. (Patient not taking: Reported on 04/23/2023)   Not Taking   calcitRIOL (ROCALTROL) 0.25 MCG capsule Take 1 capsule (0.25 mcg total)  by mouth daily. (Patient not taking: Reported on 04/22/2023) 30 capsule 2 Not Taking   escitalopram (LEXAPRO) 10 MG tablet Take 10 mg by mouth every morning. (Patient not taking: Reported on 04/22/2023)   Not Taking   folic acid (FOLVITE) 1 MG tablet Take 1 tablet (1 mg total) by mouth daily. Take 1 tablet daily (Patient not taking: Reported on 04/22/2023)   Not Taking   isosorbide mononitrate (IMDUR) 60 MG 24 hr tablet Take 60 mg by mouth every morning. (Patient not taking: Reported on 04/22/2023)   Not Taking   meclizine (ANTIVERT) 25 MG tablet Take 1 tablet (25 mg total) by mouth 2 (two) times daily. (Patient not taking: Reported on 04/23/2023) 30 tablet 0 Not Taking   ondansetron (ZOFRAN-ODT) 4 MG disintegrating tablet Take 1 tablet (4 mg total) by mouth every 8 (eight) hours as needed. (Patient not taking: Reported on 04/23/2023) 20 tablet 0 Not Taking   Home medication reconciliation was completed with the patient.   Scheduled Inpatient Medications:    allopurinol  100 mg Oral Daily   calcitRIOL  0.25 mcg Oral Daily   escitalopram  10 mg Oral BH-q7a   feeding supplement  1 Container Oral TID BM   folic acid  1 mg Oral Daily   gabapentin  300 mg Oral QHS   gentamicin cream  1 Application Topical Daily   pantoprazole (PROTONIX) IV   40 mg Intravenous Q12H   rosuvastatin  40 mg Oral BH-q7a   sodium bicarbonate  650 mg Oral TID   sodium chloride flush  3 mL Intravenous Q12H    Continuous Inpatient Infusions:    sodium chloride 120 mL/hr at 04/22/23 2316   dialysis solution 1.5% low-MG/low-CA      PRN Inpatient Medications:  sodium chloride, acetaminophen **OR** acetaminophen, HYDROcodone-acetaminophen, meclizine, methocarbamol, ondansetron **OR** ondansetron (ZOFRAN) IV, mouth rinse, sodium chloride flush  Family History: family history includes CAD in an other family member; Heart disease in his father and mother.  The patient's family history is negative for inflammatory bowel disorders, GI malignancy, or solid organ transplantation.  Social History:   reports that he quit smoking about 22 years ago. His smoking use included cigarettes. He has never used smokeless tobacco. He reports that he does not drink alcohol and does not use drugs. The patient denies ETOH, tobacco, or drug use.   Review of Systems: Constitutional: + weight loss Eyes: No changes in vision. ENT: No oral lesions, sore throat.  GI: see HPI.  Heme/Lymph: No easy bruising.  CV: No chest pain.  GU: + hematuria  Integumentary: No rashes.  Neuro: + headache Psych: + depression + anxiety Endocrine: No heat/cold intolerance.  Allergic/Immunologic: No urticaria.  Resp: No cough, SOB.  Musculoskeletal: No joint swelling.    Physical Examination: BP 114/71 (BP Location: Right Arm)   Pulse 71   Temp 98.3 F (36.8 C)   Resp 18   Ht 5\' 9"  (1.753 m)   Wt 60.8 kg   SpO2 100%   BMI 19.79 kg/m  Gen: NAD, alert and oriented x 4 HEENT: PEERLA, EOMI, Neck: supple, no JVD or thyromegaly Chest: CTA bilaterally, no wheezes, crackles, or other adventitious sounds CV: RRR, no m/g/c/r Abd: soft, NT, ND, +BS in all four quadrants; no HSM, guarding, ridigity, or rebound tenderness Ext: no edema, well perfused with 2+ pulses, Skin: no rash or  lesions noted Lymph: no LAD  Data: Lab Results  Component Value Date   WBC 7.3 04/23/2023  HGB 7.7 (L) 04/23/2023   HCT 22.5 (L) 04/23/2023   MCV 92.2 04/23/2023   PLT 381 04/23/2023   Recent Labs  Lab 04/16/23 1459 04/22/23 1206 04/23/23 0503  HGB 8.9* 7.2* 7.7*   Lab Results  Component Value Date   NA 135 04/23/2023   K 3.4 (L) 04/23/2023   CL 102 04/23/2023   CO2 23 04/23/2023   BUN 49 (H) 04/23/2023   CREATININE 6.83 (H) 04/23/2023   Lab Results  Component Value Date   ALT 10 04/22/2023   AST 19 04/22/2023   ALKPHOS 99 04/22/2023   BILITOT 0.5 04/22/2023   Recent Labs  Lab 04/22/23 1704  APTT 40*  INR 1.4*    Assessment/Plan:  73 y/o Caucasian male with a PMH of ESRD on PD, AOCD, PAF on chronic anticoagulation, HTN, HLD, T2DM, gout, hx of urinary bladder cancer s/p chemotherapy, depression, and CAD s/p PCI/DES 2003 presented to the [facility] ED 3/28 for chief complaint of generalized weakness, shortness of breath, lower abdominal pain, nausea, and vomiting. He was found to have anemia decreased from baseline hemoglobin 9s with reports of 2-day history of black stools concerning for GI blood loss. GI consulted for further evaluation and management.   GIB/Anemia - suspect UGI source. Differential includes peptic ulcer disease, gastritis +/- H pylori, erosive esophagitis, duodenitis, AVMs, malignancy, GAVE, or less likely small bowel/colon course.   Acute lower abdominal pain - CT chest/abd/pelvis negative, resolved   Hematuria - hx of recent dx bladder cancer s/p TURBT 04/05/23  ESRD on PD  PAF on chronic anticoagulation - last dose Eliquis 3/22 last week  Depression  Recommendations:  - Maintain 2 large bore IVs for access - Continue to monitor serial H&H. Transfuse for Hgb <7.0.  - No signs of overt GI bleeding. Continue to monitor for signs of GIB.  - Continue PPI IV BID for gastric protection - Supportive care per primary team - Continue PD nightly -  Correct electrolytes PRN per primary team - Continue to hold all anticoagulation/antiplatelet therapy - Full liquid diet. NPO midnight.  - Recommend EGD for endoluminal evaluation. Will plan for EGD tomorrow AM with Dr. Norma Fredrickson.  - See procedure note for findings and further recommendations - GI following along with you  I reviewed the risks (including bleeding, perforation, infection, anesthesia complications, cardiac/respiratory complications), benefits and alternatives of EGD. Patient consents to proceed.    Thank you for the consult. Please call with questions or concerns.  Gilda Crease, PA-C [facility] [phone]

## 2023-04-23 NOTE — Progress Notes (Signed)
 Peritoneal Dialysis Treatment Initiation Note  Pre TX VS:see table below  Pre TX weight:59.5kg  Consent signed and in chart. PD treatment initiated via aseptic technique.  Patient is alert and oriented. No complaints of pain.   No specimen collected. PD exit site clean, dry and intact. Gentamycin and new dressing applied.   Hand-off given to the patient's nurse.  Bedside RN educated on PD machine and how to contact tech support when PD machine alarms.    04/23/23 0046  Vitals  Temp 97.7 F (36.5 C)  Temp Source Oral  BP 96/61  MAP (mmHg) 73  BP Location Right Arm  BP Method Automatic  Patient Position (if appropriate) Lying  Pulse Rate 71  Pulse Rate Source Monitor  Resp 18  Level of Consciousness  Level of Consciousness Alert  MEWS COLOR  MEWS Score Color Green  Oxygen Therapy  SpO2 97 %  O2 Device Room Air  Patient Activity (if Appropriate) In bed  Pulse Oximetry Type Intermittent  Height and Weight  Weight 59.5 kg  Type of Scale Used Bed  Type of Weight Pre-Dialysis  BMI (Calculated) 19.36  MEWS Score  MEWS Temp 0  MEWS Systolic 1  MEWS Pulse 0  MEWS RR 0  MEWS LOC 0  MEWS Score 1     Hulen Shouts RN [facility] Dialysis Unit

## 2023-04-23 NOTE — Progress Notes (Signed)
 Peritoneal Dialysis Treatment Initiation Note  Pre TX VS:see table below  Pre TX weight:59.5kg  Consent signed and in chart. PD treatment initiated via aseptic technique.  Patient is alert and oriented. No complaints of pain.   No specimen collected. PD exit site clean, dry and intact. Gentamycin and new dressing applied.   Hand-off given to the patient's nurse.  Bedside RN educated on PD machine and how to contact tech support when PD machine alarms.    04/23/23 1914  Vitals  Temp 97.8 F (36.6 C)  Temp Source Oral  BP 121/78  MAP (mmHg) 91  BP Location Left Arm  BP Method Automatic  Patient Position (if appropriate) Lying  Pulse Rate 79  Pulse Rate Source Monitor  Resp 16  Level of Consciousness  Level of Consciousness Alert  MEWS COLOR  MEWS Score Color Green  Oxygen Therapy  SpO2 94 %  O2 Device Room Air  Patient Activity (if Appropriate) In bed  Pulse Oximetry Type Intermittent  Height and Weight  Weight 59.5 kg  Type of Scale Used Bed  Type of Weight Pre-Dialysis  BMI (Calculated) 19.36  MEWS Score  MEWS Temp 0  MEWS Systolic 0  MEWS Pulse 0  MEWS RR 0  MEWS LOC 0  MEWS Score 0     Hulen Shouts RN [facility]

## 2023-04-23 NOTE — Progress Notes (Signed)
  Peritoneal Dialysis Treatment Disconnect Note     Post Treatment Weight: 56.8 Kg   Consent signed and in chart.  PD treatment disconnect via aseptic technique.    Patient is awake and alert. No complaints of pain.    PD exit site clean, dry and intact.     Hand-off given to the patient's nurse.    Ina Kick RN [facility] Dialysis Unit

## 2023-04-24 ENCOUNTER — Inpatient Hospital Stay: Payer: Self-pay | Admitting: Anesthesiology

## 2023-04-24 ENCOUNTER — Encounter
Admission: EM | Disposition: A | Discharge status: Home / Self Care | Payer: Self-pay | Source: Home / Self Care | Attending: Internal Medicine

## 2023-04-24 DIAGNOSIS — Z7901 Long term (current) use of anticoagulants: Secondary | ICD-10-CM

## 2023-04-24 DIAGNOSIS — D631 Anemia in chronic kidney disease: Secondary | ICD-10-CM

## 2023-04-24 DIAGNOSIS — I482 Chronic atrial fibrillation, unspecified: Secondary | ICD-10-CM

## 2023-04-24 DIAGNOSIS — D62 Acute posthemorrhagic anemia: Secondary | ICD-10-CM | POA: Diagnosis not present

## 2023-04-24 DIAGNOSIS — N186 End stage renal disease: Secondary | ICD-10-CM | POA: Diagnosis not present

## 2023-04-24 DIAGNOSIS — K922 Gastrointestinal hemorrhage, unspecified: Secondary | ICD-10-CM | POA: Diagnosis not present

## 2023-04-24 DIAGNOSIS — Z992 Dependence on renal dialysis: Secondary | ICD-10-CM | POA: Diagnosis not present

## 2023-04-24 DIAGNOSIS — I10 Essential (primary) hypertension: Secondary | ICD-10-CM

## 2023-04-24 LAB — PHOSPHORUS: Phosphorus: 5.5 mg/dL — ABNORMAL HIGH (ref 2.5–4.6)

## 2023-04-24 LAB — GLUCOSE, CAPILLARY: Glucose-Capillary: 81 mg/dL (ref 70–99)

## 2023-04-24 LAB — CBC
HCT: 24.8 % — ABNORMAL LOW (ref 39.0–52.0)
Hemoglobin: 8.4 g/dL — ABNORMAL LOW (ref 13.0–17.0)
MCH: 30.9 pg (ref 26.0–34.0)
MCHC: 33.9 g/dL (ref 30.0–36.0)
MCV: 91.2 fL (ref 80.0–100.0)
Platelets: 413 10*3/uL — ABNORMAL HIGH (ref 150–400)
RBC: 2.72 MIL/uL — ABNORMAL LOW (ref 4.22–5.81)
RDW: 16.1 % — ABNORMAL HIGH (ref 11.5–15.5)
WBC: 11.6 10*3/uL — ABNORMAL HIGH (ref 4.0–10.5)
nRBC: 0 % (ref 0.0–0.2)

## 2023-04-24 LAB — BASIC METABOLIC PANEL WITH GFR
Anion gap: 9 (ref 5–15)
BUN: 36 mg/dL — ABNORMAL HIGH (ref 8–23)
CO2: 26 mmol/L (ref 22–32)
Calcium: 7.3 mg/dL — ABNORMAL LOW (ref 8.9–10.3)
Chloride: 100 mmol/L (ref 98–111)
Creatinine, Ser: 6.04 mg/dL — ABNORMAL HIGH (ref 0.61–1.24)
GFR, Estimated: 9 mL/min — ABNORMAL LOW (ref >60)
Glucose, Bld: 93 mg/dL (ref 70–99)
Potassium: 3.4 mmol/L — ABNORMAL LOW (ref 3.5–5.1)
Sodium: 135 mmol/L (ref 135–145)

## 2023-04-24 LAB — MAGNESIUM: Magnesium: 1.5 mg/dL — ABNORMAL LOW (ref 1.7–2.4)

## 2023-04-24 SURGERY — EGD (ESOPHAGOGASTRODUODENOSCOPY)
Anesthesia: General

## 2023-04-24 MED ORDER — PHENYLEPHRINE 80 MCG/ML (10ML) SYRINGE FOR IV PUSH (FOR BLOOD PRESSURE SUPPORT)
PREFILLED_SYRINGE | INTRAVENOUS | Status: DC | PRN
  Administered 2023-04-24: 160 ug via INTRAVENOUS

## 2023-04-24 MED ORDER — LOSARTAN POTASSIUM 100 MG PO TABS: 50.0000 mg | ORAL_TABLET | Freq: Every day | ORAL | Status: DC

## 2023-04-24 MED ORDER — PROPOFOL 10 MG/ML IV BOLUS
INTRAVENOUS | Status: AC
  Filled 2023-04-24: qty 40

## 2023-04-24 MED ORDER — PROPOFOL 10 MG/ML IV BOLUS
INTRAVENOUS | Status: DC | PRN
  Administered 2023-04-24: 70 mg via INTRAVENOUS

## 2023-04-24 MED ORDER — FOLIC ACID 1 MG PO TABS: 1.0000 mg | ORAL_TABLET | Freq: Every day | ORAL | 1 refills | Status: DC

## 2023-04-24 MED ORDER — METOPROLOL SUCCINATE ER 100 MG PO TB24: 50.0000 mg | ORAL_TABLET | ORAL | 1 refills | Status: DC

## 2023-04-24 MED ORDER — DEXMEDETOMIDINE HCL IN NACL 80 MCG/20ML IV SOLN
INTRAVENOUS | Status: DC | PRN
  Administered 2023-04-24: 4 ug via INTRAVENOUS

## 2023-04-24 MED ORDER — PANTOPRAZOLE SODIUM 40 MG PO TBEC: 40.0000 mg | DELAYED_RELEASE_TABLET | Freq: Every day | ORAL | 1 refills | Status: DC

## 2023-04-24 MED ORDER — LIDOCAINE HCL (CARDIAC) PF 100 MG/5ML IV SOSY
PREFILLED_SYRINGE | INTRAVENOUS | Status: DC | PRN
  Administered 2023-04-24: 40 mg via INTRAVENOUS

## 2023-04-24 MED ORDER — PROPOFOL 500 MG/50ML IV EMUL
INTRAVENOUS | Status: DC | PRN
  Administered 2023-04-24: 175 ug/kg/min via INTRAVENOUS

## 2023-04-24 MED ORDER — SODIUM CHLORIDE 0.9 % IV SOLN
INTRAVENOUS | Status: DC | PRN
Start: 2023-04-24 — End: 2023-04-24

## 2023-04-24 MED ORDER — LIDOCAINE HCL (PF) 2 % IJ SOLN
INTRAMUSCULAR | Status: AC
  Filled 2023-04-24: qty 5

## 2023-04-24 NOTE — Anesthesia Procedure Notes (Signed)
 Date/Time: 04/24/2023 8:15 AM  Performed by: Stormy Fabian, CRNAPre-anesthesia Checklist: Patient identified, Emergency Drugs available, Suction available and Patient being monitored Patient Re-evaluated:Patient Re-evaluated prior to induction Oxygen Delivery Method: Nasal cannula Induction Type: IV induction Dental Injury: Teeth and Oropharynx as per pre-operative assessment  Comments: Nasal cannula with etCO2 monitoring

## 2023-04-24 NOTE — Plan of Care (Signed)

## 2023-04-24 NOTE — Transfer of Care (Signed)
 Immediate Anesthesia Transfer of Care Note  Patient: Casey French  Procedure(s) Performed: Procedure(s): EGD (ESOPHAGOGASTRODUODENOSCOPY) (N/A)  Patient Location: PACU and Endoscopy Unit  Anesthesia Type:General  Level of Consciousness: sedated  Airway & Oxygen Therapy: Patient Spontanous Breathing and Patient connected to nasal cannula oxygen  Post-op Assessment: Report given to RN and Post -op Vital signs reviewed and stable  Post vital signs: Reviewed and stable  Last Vitals:  Vitals:   04/24/23 0726 04/24/23 0827  BP: 138/68 (!) 83/51  Pulse: 86 74  Resp: 18 (!) 21  Temp:    SpO2: 96% 98%    Complications: No apparent anesthesia complications

## 2023-04-24 NOTE — Op Note (Signed)
 [facility] Patient Name: Casey French Procedure Date: 04/24/2023 7:15 AM MRN: [id number] Account #: [id number] Date of Birth: 1950/10/20 Admit Type: Inpatient Age: 73 Room: [facility] ENDO ROOM 4 Gender: Male Note Status: Finalized Instrument Name: Upper Endoscope [id number] Procedure:             Upper GI endoscopy Indications:           Acute post hemorrhagic anemia, Melena Providers:             Boykin Nearing. Norma Fredrickson MD, MD Referring MD:          Rhona Leavens. Burnett Sheng, MD (Referring MD) Medicines:             Propofol per Anesthesia Complications:         No immediate complications. Estimated blood loss: None. Procedure:             Pre-Anesthesia Assessment:                        - The risks and benefits of the procedure and the                         sedation options and risks were discussed with the                         patient. All questions were answered and informed                         consent was obtained.                        - Patient identification and proposed procedure were                         verified prior to the procedure by the nurse. The                         procedure was verified in the procedure room.                        - ASA Grade Assessment: II - A patient with mild                         systemic disease.                        - After reviewing the risks and benefits, the patient                         was deemed in satisfactory condition to undergo the                         procedure.                        After obtaining informed consent, the endoscope was                         passed under direct vision. Throughout the procedure,  the patient's blood pressure, pulse, and oxygen                         saturations were monitored continuously. The Endoscope                         was introduced through the mouth, and advanced to the                         third part of duodenum.  The upper GI endoscopy was                         accomplished without difficulty. The patient tolerated                         the procedure well. Findings:      The esophagus was normal.      Scattered mild inflammation characterized by congestion (edema) and       erythema was found in the gastric antrum.      The cardia and gastric fundus were normal on retroflexion.      The exam of the stomach was otherwise normal.      Multiple localized erosions without bleeding were found in the duodenal       bulb and at the minor papilla.      The second portion of the duodenum and third portion of the duodenum       were normal. Impression:            - Normal esophagus.                        - Gastritis.                        - Duodenal erosions without bleeding.                        - Normal second portion of the duodenum and third                         portion of the duodenum.                        - No specimens collected. Recommendation:        - Return patient to hospital ward for possible                         discharge same day.                        - Advance diet as tolerated.                        - Continue present medications.                        - Use Protonix (pantoprazole) 40 mg PO daily.                        - [employer] will sign off for now. Call us back if any  changes clinically or if we can help for any reason. Procedure Code(s):     --- Professional ---                        [phone], Esophagogastroduodenoscopy, flexible,                         transoral; diagnostic, including collection of                         specimen(s) by brushing or washing, when performed                         (separate procedure) Diagnosis Code(s):     --- Professional ---                        K92.1, Melena (includes Hematochezia)                        D62, Acute posthemorrhagic anemia                        K26.9, Duodenal ulcer, unspecified as  acute or                         chronic, without hemorrhage or perforation                        K29.70, Gastritis, unspecified, without bleeding CPT copyright 2022 American [employer] Association. All rights reserved. The codes documented in this report are preliminary and upon coder review may  be revised to meet current compliance requirements. Stanton Kidney MD, MD 04/24/2023 8:36:26 AM This report has been signed electronically. Number of Addenda: 0 Note Initiated On: 04/24/2023 7:15 AM Estimated Blood Loss:  Estimated blood loss: none.      [facility]

## 2023-04-24 NOTE — Discharge Summary (Signed)
 Physician Discharge Summary   Patient: Casey French MRN: [id number] DOB: 02/17/1950  Admit date:     04/22/2023  Discharge date: 04/24/23  Discharge Physician: Marcelino Duster   PCP: Jerl Mina, MD   Recommendations at discharge:   PCP follow up in 1 week. BP lower side, need adjustment of antihypertensives. PD as scheduled, follow nephrology for mircera adjustment, monitor h/h.  Discharge Diagnoses: Principal Problem:   Acute GI bleeding Active Problems:   ESRD on peritoneal dialysis (HCC)   Anemia in chronic kidney disease   Hyperlipidemia   Chronic anticoagulation   Atrial fibrillation, chronic (HCC)   HTN (hypertension)  Resolved Problems:   * No resolved hospital problems. *  Hospital Course: Casey French is a 73 y.o. male with Past medical history of ESRD on PD, PAfib on DOAC (Last dose was on Friday 3/21) CAD, HTN, HLD, T2DM, Gout, urinary bladder cancer s/p chemo, presented at [facility] ED with complaining of feeling weak, shortness of breath, lower abdominal pain with nausea and vomiting.  Patient had blood in the urine last Saturday, which is improving but is still has tinge of blood and noticed darker stools for past 2 days.    Patient is admitted to the hospitalist service for further management evaluation of GI bleeding, acute blood loss anemia, hematuria.  Assessment and Plan: Acute GI bleeding- GI evaluated him, EGD showed gastritis, duodenal erosions no active bleeding noted. Advised pantoprazole 40 mg daily. Able to tolerate diet.  Resumed Eliquis therapy.  Acute blood loss anemia Acute on chronic anemia- Hemoglobin 7.2 admission S/p 1 unit of PRBC, Hb 8.4 today. Nephro to follow and adjust Mircera dosing outpatient.  Continue folate supplements.   Hematuria, history of bladder cancer Complaining of lower abdominal pain, no significant findings on CT scan Renal sono shows atrophic kidneys, medical renal disease. Hematuria better  today. Outpatient urology follow up suggested.   ESRD on peritoneal dialysis Metabolic acidosis, s/p bicarbonate 100 mEq IV push given Continue oral bicarbonate supplement. He tolerated PD overnight well. Resumed Calcitrol.   P paroxysmal Afib on DOAC (Last dose was on Friday 3/21) CAD, HTN, HLD Continue to monitor on telemetry Resumed aspirin and Eliquis. Held Norvasc. Decreased dosage of Losartan and metoprolol to 50mg . Resumed Crestor.    History of diabetes mellitus,  Patient is not on any medication. Advised carb consistent diet.   Depression: Lexapro 10 mg p.o. daily home dose.   Gout: Continue allopurinol.   Chronic pain: resumed home medications.        Consultants: Nephrology, GI Procedures performed: EGD Disposition: Home Diet recommendation:  Discharge Diet Orders (From admission, onward)     Start     Ordered   04/24/23 0000  Diet - low sodium heart healthy        04/24/23 1208           Renal diet DISCHARGE MEDICATION: Allergies as of 04/24/2023       Reactions   Morphine Anxiety   agitation        Medication List     STOP taking these medications    allopurinol 100 MG tablet Commonly known as: ZYLOPRIM   amLODipine 10 MG tablet Commonly known as: NORVASC   escitalopram 10 MG tablet Commonly known as: LEXAPRO   isosorbide mononitrate 60 MG 24 hr tablet Commonly known as: IMDUR   omeprazole 20 MG capsule Commonly known as: PRILOSEC       TAKE these medications    acetaminophen 325 MG  tablet Commonly known as: TYLENOL Take 2 tablets (650 mg total) by mouth every 6 (six) hours as needed for mild pain (or Fever >/= 101).   apixaban 2.5 MG Tabs tablet Commonly known as: ELIQUIS Take 2.5 mg by mouth 2 (two) times daily.   aspirin EC 81 MG tablet Take 81 mg by mouth daily.   calcitRIOL 0.25 MCG capsule Commonly known as: ROCALTROL Take 1 capsule (0.25 mcg total) by mouth daily.   folic acid 1 MG tablet Commonly known  as: FOLVITE Take 1 tablet (1 mg total) by mouth daily. Take 1 tablet daily   gabapentin 300 MG capsule Commonly known as: NEURONTIN Take 300 mg by mouth at bedtime.   HYDROcodone-acetaminophen 5-325 MG tablet Commonly known as: NORCO/VICODIN Take 1 tablet by mouth every 6 (six) hours as needed for moderate pain (pain score 4-6).   hydrocortisone 2.5 % rectal cream Commonly known as: ANUSOL-HC Place 1 application rectally 2 (two) times daily as needed for hemorrhoids or anal itching.   losartan 100 MG tablet Commonly known as: COZAAR Take 0.5 tablets (50 mg total) by mouth daily. What changed: how much to take   meclizine 25 MG tablet Commonly known as: ANTIVERT Take 1 tablet (25 mg total) by mouth 2 (two) times daily.   methocarbamol 500 MG tablet Commonly known as: ROBAXIN Take 500 mg by mouth 3 (three) times daily as needed.   metoprolol succinate 100 MG 24 hr tablet Commonly known as: TOPROL-XL Take 0.5 tablets (50 mg total) by mouth every morning. Take with or immediately following a meal. What changed: how much to take   nitroGLYCERIN 0.4 MG SL tablet Commonly known as: NITROSTAT Place 0.4 mg under the tongue every 5 (five) minutes as needed for chest pain.   ondansetron 4 MG disintegrating tablet Commonly known as: ZOFRAN-ODT Take 1 tablet (4 mg total) by mouth every 8 (eight) hours as needed.   ondansetron 4 MG tablet Commonly known as: ZOFRAN Take 4 mg by mouth every 8 (eight) hours as needed for nausea or vomiting.   pantoprazole 40 MG tablet Commonly known as: Protonix Take 1 tablet (40 mg total) by mouth daily.   psyllium 58.6 % packet Commonly known as: METAMUCIL Take 1 packet by mouth 2 (two) times daily.   rosuvastatin 40 MG tablet Commonly known as: CRESTOR Take 40 mg by mouth every morning.   Vitamin D (Ergocalciferol) 1.25 MG (50000 UNIT) Caps capsule Commonly known as: DRISDOL Take 50,000 Units by mouth once a week.        Follow-up  Information     Jerl Mina, MD Follow up in 1 week(s).   Specialty: Family Medicine Contact information: [address] [place] [address] [phone]                Discharge Exam: Ceasar Mons Weights   04/23/23 1914 04/24/23 0521 04/24/23 0726  Weight: 59.5 kg 58.9 kg 58.9 kg      04/24/2023    8:49 AM 04/24/2023    8:43 AM 04/24/2023    8:31 AM  Vitals with BMI  Systolic 122 93 100  Diastolic 64 50 57  Pulse 81 83 69    General - Elderly thin built Caucasian male, no apparent distress HEENT - PERRLA, EOMI, atraumatic head, non tender sinuses. Lung - Clear, basal rales, no rhonchi, wheezes. Heart - S1, S2 heard, no murmurs, rubs, trace pedal edema. Abdomen - Soft, non tender, bowel sounds good Neuro - Alert, awake and oriented  x 3, non focal exam. Skin - Warm and dry.  Condition at discharge: stable  The results of significant diagnostics from this hospitalization (including imaging, microbiology, ancillary and laboratory) are listed below for reference.   Imaging Studies: US RENAL Result Date: 04/22/2023 CLINICAL DATA:  Hematuria.  History of bladder cancer. EXAM: RENAL / URINARY TRACT ULTRASOUND COMPLETE COMPARISON:  CT 04/22/2023 FINDINGS: Right Kidney: Renal measurements: 8.2 x 3.2 x 3.4 cm = volume: 46 mL. 2.5 cm lower pole cyst. Cortical thinning with increased echotexture. No hydronephrosis. Left Kidney: Renal measurements: 5.9 x 3.3 x 2.6 cm = volume: 26 mL. Not well visualized. Increased echotexture. No hydronephrosis. Bladder: Appears normal for degree of bladder distention. Other: None. IMPRESSION: Atrophic kidneys bilaterally with increased echotexture compatible with chronic medical renal disease. No hydronephrosis. Electronically Signed   By: Charlett Nose M.D.   On: 04/22/2023 19:08   CT ABDOMEN PELVIS W CONTRAST Result Date: 04/22/2023 CLINICAL DATA:  Lower abdominal pain. Weakness. End-stage renal disease on peritoneal dialysis, history of bladder  cancer. EXAM: CT ABDOMEN AND PELVIS WITH CONTRAST TECHNIQUE: Multidetector CT imaging of the abdomen and pelvis was performed using the standard protocol following bolus administration of intravenous contrast. RADIATION DOSE REDUCTION: This exam was performed according to the departmental dose-optimization program which includes automated exposure control, adjustment of the mA and/or kV according to patient size and/or use of iterative reconstruction technique. CONTRAST:  OMNIPAQUE IOHEXOL 350 MG/ML SOLN COMPARISON:  CT 6 days ago 04/16/2023 FINDINGS: Lower chest: No acute findings. Hepatobiliary: No focal liver abnormality is seen. No gallstones, gallbladder wall thickening, or biliary dilatation. Pancreas: Unremarkable. No pancreatic ductal dilatation or surrounding inflammatory changes. Spleen: Normal in size without focal abnormality. Adrenals/Urinary Tract: No adrenal nodule. Bilateral renal parenchymal atrophy. Bilateral renal cysts. No further follow-up imaging is recommended. Slight extrarenal pelvis configuration of the left kidney. No frank hydronephrosis. The urinary bladder is partially distended. Streak artifact from left hip arthroplasty obscures detailed bladder assessment. Stomach/Bowel: Decompressed stomach. Scattered fecalization of small bowel contents. No obstruction or inflammatory change. Colonic diverticulosis without diverticulitis. Suspected mural hypertrophy of the sigmoid. Normal appendix visualized. Vascular/Lymphatic: Dense aortic and branch atherosclerosis. No aortic aneurysm. The portal vein is patent. No enlarged lymph nodes in the abdomen or pelvis. Reproductive: Prostate is unremarkable. Other: Peritoneal dialysis catheter from a left-sided approach. The tip is looped in the left mid abdomen. There is a small amount of free fluid in the pelvis, diminished from prior exam. Single focus of free air in the upper abdomen is not unexpected in the setting of peritoneal dialysis. No  inguinal hernia. Musculoskeletal: Right L5 pars defect. No suspicious bone lesion. Left hip arthroplasty IMPRESSION: 1. No acute abnormality in the abdomen/pelvis. Diminished bladder wall thickening from prior. 2. Peritoneal dialysis catheter from a left-sided approach. Small amount of free fluid in the pelvis, diminished from prior exam. 3. Colonic diverticulosis. Suspected mural hypertrophy of the sigmoid colon, can be seen with chronic inflammation, but no active diverticulitis. Aortic Atherosclerosis (ICD10-I70.0). Electronically Signed   By: Narda Rutherford M.D.   On: 04/22/2023 16:41   CT Angio Chest PE W/Cm &/Or Wo Cm Result Date: 04/22/2023 CLINICAL DATA:  High probability for PE.  Bladder cancer. EXAM: CT ANGIOGRAPHY CHEST WITH CONTRAST TECHNIQUE: Multidetector CT imaging of the chest was performed using the standard protocol during bolus administration of intravenous contrast. Multiplanar CT image reconstructions and MIPs were obtained to evaluate the vascular anatomy. RADIATION DOSE REDUCTION: This exam was performed according to  the departmental dose-optimization program which includes automated exposure control, adjustment of the mA and/or kV according to patient size and/or use of iterative reconstruction technique. CONTRAST:  OMNIPAQUE IOHEXOL 350 MG/ML SOLN COMPARISON:  CT chest abdomen and pelvis 04/18/2022 FINDINGS: Cardiovascular: Satisfactory opacification of the pulmonary arteries to the segmental level. No evidence of pulmonary embolism. Normal heart size. No pericardial effusion. There are atherosclerotic calcifications of the aorta. Mediastinum/Nodes: Thyroid gland is mildly diffusely heterogeneous. There are no enlarged mediastinal or hilar lymph nodes. Esophagus is within normal limits. Lungs/Pleura: Mild emphysematous changes are present. There is scarring in both lung apices. There is a band of atelectasis in the right lower lobe, unchanged. There is no new focal lung  consolidation, pleural effusion or pneumothorax. Upper Abdomen: No acute abnormality. Musculoskeletal: No chest wall abnormality. No acute or significant osseous findings. Review of the MIP images confirms the above findings. IMPRESSION: 1. No evidence for pulmonary embolism. 2. Stable band of atelectasis in the right lower lobe. Aortic Atherosclerosis (ICD10-I70.0) and Emphysema (ICD10-J43.9). Electronically Signed   By: Darliss Cheney M.D.   On: 04/22/2023 16:41   DG Chest 2 View Result Date: 04/22/2023 CLINICAL DATA:  Shortness of breath. EXAM: CHEST - 2 VIEW COMPARISON:  03/15/2023 FINDINGS: Linear areas of atelectasis/scarring noted at the lung bases, right more than left. Bilateral lung fields are otherwise clear. No dense consolidation or lung collapse. Bilateral costophrenic angles are clear. Normal cardio-mediastinal silhouette. No acute osseous abnormalities. The soft tissues are within normal limits. IMPRESSION: No active cardiopulmonary disease. Electronically Signed   By: Jules Schick M.D.   On: 04/22/2023 13:58   CT ABDOMEN PELVIS WO CONTRAST Result Date: 04/16/2023 CLINICAL DATA:  Flank pain hematuria EXAM: CT ABDOMEN AND PELVIS WITHOUT CONTRAST TECHNIQUE: Multidetector CT imaging of the abdomen and pelvis was performed following the standard protocol without IV contrast. RADIATION DOSE REDUCTION: This exam was performed according to the departmental dose-optimization program which includes automated exposure control, adjustment of the mA and/or kV according to patient size and/or use of iterative reconstruction technique. COMPARISON:  CT 12/15/2022 FINDINGS: Lower chest: Lung bases demonstrate no acute airspace disease. Coronary vascular calcification. Hepatobiliary: No focal liver abnormality is seen. No gallstones, gallbladder wall thickening, or biliary dilatation. Pancreas: Unremarkable. No pancreatic ductal dilatation or surrounding inflammatory changes. Spleen: Normal in size without  focal abnormality. Adrenals/Urinary Tract: Adrenal glands are normal. Kidneys are slightly atrophic. No hydronephrosis. Right renal cyst for which no imaging follow-up is recommended. Slightly thick-walled appearance of the urinary bladder. Stomach/Bowel: Stomach nonenlarged. No dilated small bowel. No acute bowel wall thickening. Negative appendix. Diverticular disease of the left colon Vascular/Lymphatic: Aortic atherosclerosis. No enlarged abdominal or pelvic lymph nodes. Reproductive: Prostate is unremarkable. Other: No free air Musculoskeletal: Peritoneal dialysis catheter coiled in the posterior pelvis. Small volume abdominal ascites, small moderate volume pelvic ascites. IMPRESSION: 1. Negative for hydronephrosis or hydroureter. No obstructing renal or ureteral calculi. 2. Slightly thick-walled appearance of the urinary bladder, nonspecific and could be infectious/inflammatory or related to provided clinical history 3. Peritoneal dialysis catheter coiled in the posterior pelvis. Small volume abdominal ascites, small to moderate volume pelvic ascites. 4. Diverticular disease of the left colon without acute inflammatory process. 5. Aortic atherosclerosis. Electronically Signed   By: Jasmine Pang M.D.   On: 04/16/2023 19:02    Microbiology: Results for orders placed or performed in visit on 03/08/23  CULTURE, URINE COMPREHENSIVE     Status: None   Collection Time: 03/08/23  4:40 PM  Specimen: Urine   UR  Result Value Ref Range Status   Urine Culture, Comprehensive Final report  Final   Organism ID, Bacteria Comment  Final    Comment: No growth in 36 - 48 hours.  Microscopic Examination     Status: Abnormal   Collection Time: 03/08/23  4:40 PM   Urine  Result Value Ref Range Status   WBC, UA 0-5 0 - 5 /hpf Final   RBC, Urine 11-30 (A) 0 - 2 /hpf Final   Epithelial Cells (non renal) 0-10 0 - 10 /hpf Final   Casts Present (A) None seen /lpf Final   Cast Type Hyaline casts N/A Final   Mucus,  UA Present (A) Not Estab. Final   Bacteria, UA Few None seen/Few Final    Labs: CBC: Recent Labs  Lab 04/22/23 1206 04/23/23 0503 04/24/23 0444  WBC 11.0* 7.3 11.6*  HGB 7.2* 7.7* 8.4*  HCT 23.1* 22.5* 24.8*  MCV 100.9* 92.2 91.2  PLT 500* 381 413*   Basic Metabolic Panel: Recent Labs  Lab 04/22/23 1206 04/23/23 0503 04/24/23 0444  NA 135 135 135  K 4.2 3.4* 3.4*  CL 103 102 100  CO2 16* 23 26  GLUCOSE 107* 99 93  BUN 53* 49* 36*  CREATININE 7.10* 6.83* 6.04*  CALCIUM 7.8* 7.4* 7.3*  MG  --  1.5* 1.5*  PHOS  --  6.0* 5.5*   Liver Function Tests: Recent Labs  Lab 04/22/23 1206  AST 19  ALT 10  ALKPHOS 99  BILITOT 0.5  PROT 5.5*  ALBUMIN 2.3*   CBG: Recent Labs  Lab 04/24/23 0730  GLUCAP 81    Discharge time spent: 35 minutes.  Signed: Marcelino Duster, MD [facility] 04/24/2023

## 2023-04-24 NOTE — Anesthesia Postprocedure Evaluation (Signed)
 Anesthesia Post Note  Patient: Casey French  Procedure(s) Performed: EGD (ESOPHAGOGASTRODUODENOSCOPY)  Patient location during evaluation: Endoscopy Anesthesia Type: General Level of consciousness: awake and alert Pain management: pain level controlled Vital Signs Assessment: post-procedure vital signs reviewed and stable Respiratory status: spontaneous breathing, nonlabored ventilation, respiratory function stable and patient connected to nasal cannula oxygen Cardiovascular status: blood pressure returned to baseline and stable Postop Assessment: no apparent nausea or vomiting Anesthetic complications: no   No notable events documented.   Last Vitals:  Vitals:   04/24/23 0843 04/24/23 0849  BP: (!) 93/50 122/64  Pulse: 83 81  Resp: 20 19  Temp:    SpO2: 96% 97%    Last Pain:  Vitals:   04/24/23 0849  TempSrc:   PainSc: 0-No pain                 Louie Boston

## 2023-04-24 NOTE — Progress Notes (Signed)
 [facility]  ROUNDING NOTE   Subjective:   Casey French is a 73 y.o. male with past medical conditions including hypertension, hyperlipidemia, PAD, gout, type 2 diabetes, paroxysmal atrial fibrillation on Eliquis, and end-stage renal disease on peritoneal dialysis.  Patient presents to the emergency department for evaluation of shortness of breath and weakness and has been admitted for Weakness [R53.1] SOB (shortness of breath) [R06.02] Acute GI bleeding [K92.2] Lower abdominal pain [R10.30] Gastrointestinal hemorrhage, unspecified gastrointestinal hemorrhage type [K92.2] Hematuria, unspecified type [R31.9] Nausea and vomiting, unspecified vomiting type [R11.2]  Patient is known to our practice and is followed out of [facility] by Dr. Wynelle Link.    Patient laying in bed after GI procedure Alert and oriented Denies further signs of bleeding Denies shortness of breath  PD went well overnight   Objective:  Vital signs in last 24 hours:  Temp:  [97.8 F (36.6 C)-99.1 F (37.3 C)] 98.4 F (36.9 C) (03/30 0532) Pulse Rate:  [69-90] 81 (03/30 0849) Resp:  [16-21] 19 (03/30 0849) BP: (83-138)/(50-78) 122/64 (03/30 0849) SpO2:  [94 %-98 %] 97 % (03/30 0849) Weight:  [58.9 kg-59.5 kg] 58.9 kg (03/30 0726)  Weight change: 0.941 kg Filed Weights   04/23/23 1914 04/24/23 0521 04/24/23 0726  Weight: 59.5 kg 58.9 kg 58.9 kg    Intake/Output: I/O last 3 completed shifts: In: 710.6 [P.O.:240; I.V.:132.6; Blood:338] Out: 950 [Urine:675; Other:275]   Intake/Output this shift:  Total I/O In: 250 [I.V.:250] Out: -   Physical Exam: General: NAD,   Head: Normocephalic, atraumatic. Moist oral mucosal membranes  Eyes: Anicteric  Neck: Supple  Lungs:  Clear to auscultation, normal effort  Heart: Regular rate and rhythm  Abdomen:  Soft, nontender, PDC  Extremities: No peripheral edema.  Neurologic: Alert, moving all four extremities  Skin: No lesions   Access: PD catheter    Basic Metabolic Panel: Recent Labs  Lab 04/22/23 1206 04/23/23 0503 04/24/23 0444  NA 135 135 135  K 4.2 3.4* 3.4*  CL 103 102 100  CO2 16* 23 26  GLUCOSE 107* 99 93  BUN 53* 49* 36*  CREATININE 7.10* 6.83* 6.04*  CALCIUM 7.8* 7.4* 7.3*  MG  --  1.5* 1.5*  PHOS  --  6.0* 5.5*    Liver Function Tests: Recent Labs  Lab 04/22/23 1206  AST 19  ALT 10  ALKPHOS 99  BILITOT 0.5  PROT 5.5*  ALBUMIN 2.3*   No results for input(s): "LIPASE", "AMYLASE" in the last 168 hours. No results for input(s): "AMMONIA" in the last 168 hours.  CBC: Recent Labs  Lab 04/22/23 1206 04/23/23 0503 04/24/23 0444  WBC 11.0* 7.3 11.6*  HGB 7.2* 7.7* 8.4*  HCT 23.1* 22.5* 24.8*  MCV 100.9* 92.2 91.2  PLT 500* 381 413*    Cardiac Enzymes: No results for input(s): "CKTOTAL", "CKMB", "CKMBINDEX", "TROPONINI" in the last 168 hours.  BNP: Invalid input(s): "POCBNP"  CBG: Recent Labs  Lab 04/24/23 0730  GLUCAP 81    Microbiology: Results for orders placed or performed in visit on 03/08/23  CULTURE, URINE COMPREHENSIVE     Status: None   Collection Time: 03/08/23  4:40 PM   Specimen: Urine   UR  Result Value Ref Range Status   Urine Culture, Comprehensive Final report  Final   Organism ID, Bacteria Comment  Final    Comment: No growth in 36 - 48 hours.  Microscopic Examination     Status: Abnormal   Collection Time: 03/08/23  4:40 PM   Urine  Result Value Ref Range Status   WBC, UA 0-5 0 - 5 /hpf Final   RBC, Urine 11-30 (A) 0 - 2 /hpf Final   Epithelial Cells (non renal) 0-10 0 - 10 /hpf Final   Casts Present (A) None seen /lpf Final   Cast Type Hyaline casts N/A Final   Mucus, UA Present (A) Not Estab. Final   Bacteria, UA Few None seen/Few Final    Coagulation Studies: Recent Labs    04/22/23 1704  LABPROT 17.7*  INR 1.4*    Urinalysis: Recent Labs    04/22/23 1952  COLORURINE YELLOW*  LABSPEC 1.020  PHURINE 7.0  GLUCOSEU 150*   HGBUR MODERATE*  BILIRUBINUR NEGATIVE  KETONESUR NEGATIVE  PROTEINUR >=300*  NITRITE NEGATIVE  LEUKOCYTESUR NEGATIVE      Imaging: US RENAL Result Date: 04/22/2023 CLINICAL DATA:  Hematuria.  History of bladder cancer. EXAM: RENAL / URINARY TRACT ULTRASOUND COMPLETE COMPARISON:  CT 04/22/2023 FINDINGS: Right Kidney: Renal measurements: 8.2 x 3.2 x 3.4 cm = volume: 46 mL. 2.5 cm lower pole cyst. Cortical thinning with increased echotexture. No hydronephrosis. Left Kidney: Renal measurements: 5.9 x 3.3 x 2.6 cm = volume: 26 mL. Not well visualized. Increased echotexture. No hydronephrosis. Bladder: Appears normal for degree of bladder distention. Other: None. IMPRESSION: Atrophic kidneys bilaterally with increased echotexture compatible with chronic medical renal disease. No hydronephrosis. Electronically Signed   By: Charlett Nose M.D.   On: 04/22/2023 19:08   CT ABDOMEN PELVIS W CONTRAST Result Date: 04/22/2023 CLINICAL DATA:  Lower abdominal pain. Weakness. End-stage renal disease on peritoneal dialysis, history of bladder cancer. EXAM: CT ABDOMEN AND PELVIS WITH CONTRAST TECHNIQUE: Multidetector CT imaging of the abdomen and pelvis was performed using the standard protocol following bolus administration of intravenous contrast. RADIATION DOSE REDUCTION: This exam was performed according to the departmental dose-optimization program which includes automated exposure control, adjustment of the mA and/or kV according to patient size and/or use of iterative reconstruction technique. CONTRAST:  OMNIPAQUE IOHEXOL 350 MG/ML SOLN COMPARISON:  CT 6 days ago 04/16/2023 FINDINGS: Lower chest: No acute findings. Hepatobiliary: No focal liver abnormality is seen. No gallstones, gallbladder wall thickening, or biliary dilatation. Pancreas: Unremarkable. No pancreatic ductal dilatation or surrounding inflammatory changes. Spleen: Normal in size without focal abnormality. Adrenals/Urinary Tract: No adrenal  nodule. Bilateral renal parenchymal atrophy. Bilateral renal cysts. No further follow-up imaging is recommended. Slight extrarenal pelvis configuration of the left kidney. No frank hydronephrosis. The urinary bladder is partially distended. Streak artifact from left hip arthroplasty obscures detailed bladder assessment. Stomach/Bowel: Decompressed stomach. Scattered fecalization of small bowel contents. No obstruction or inflammatory change. Colonic diverticulosis without diverticulitis. Suspected mural hypertrophy of the sigmoid. Normal appendix visualized. Vascular/Lymphatic: Dense aortic and branch atherosclerosis. No aortic aneurysm. The portal vein is patent. No enlarged lymph nodes in the abdomen or pelvis. Reproductive: Prostate is unremarkable. Other: Peritoneal dialysis catheter from a left-sided approach. The tip is looped in the left mid abdomen. There is a small amount of free fluid in the pelvis, diminished from prior exam. Single focus of free air in the upper abdomen is not unexpected in the setting of peritoneal dialysis. No inguinal hernia. Musculoskeletal: Right L5 pars defect. No suspicious bone lesion. Left hip arthroplasty IMPRESSION: 1. No acute abnormality in the abdomen/pelvis. Diminished bladder wall thickening from prior. 2. Peritoneal dialysis catheter from a left-sided approach. Small amount of free fluid in the pelvis, diminished from prior exam. 3. Colonic diverticulosis.  Suspected mural hypertrophy of the sigmoid colon, can be seen with chronic inflammation, but no active diverticulitis. Aortic Atherosclerosis (ICD10-I70.0). Electronically Signed   By: Narda Rutherford M.D.   On: 04/22/2023 16:41   CT Angio Chest PE W/Cm &/Or Wo Cm Result Date: 04/22/2023 CLINICAL DATA:  High probability for PE.  Bladder cancer. EXAM: CT ANGIOGRAPHY CHEST WITH CONTRAST TECHNIQUE: Multidetector CT imaging of the chest was performed using the standard protocol during bolus administration of  intravenous contrast. Multiplanar CT image reconstructions and MIPs were obtained to evaluate the vascular anatomy. RADIATION DOSE REDUCTION: This exam was performed according to the departmental dose-optimization program which includes automated exposure control, adjustment of the mA and/or kV according to patient size and/or use of iterative reconstruction technique. CONTRAST:  OMNIPAQUE IOHEXOL 350 MG/ML SOLN COMPARISON:  CT chest abdomen and pelvis 04/18/2022 FINDINGS: Cardiovascular: Satisfactory opacification of the pulmonary arteries to the segmental level. No evidence of pulmonary embolism. Normal heart size. No pericardial effusion. There are atherosclerotic calcifications of the aorta. Mediastinum/Nodes: Thyroid gland is mildly diffusely heterogeneous. There are no enlarged mediastinal or hilar lymph nodes. Esophagus is within normal limits. Lungs/Pleura: Mild emphysematous changes are present. There is scarring in both lung apices. There is a band of atelectasis in the right lower lobe, unchanged. There is no new focal lung consolidation, pleural effusion or pneumothorax. Upper Abdomen: No acute abnormality. Musculoskeletal: No chest wall abnormality. No acute or significant osseous findings. Review of the MIP images confirms the above findings. IMPRESSION: 1. No evidence for pulmonary embolism. 2. Stable band of atelectasis in the right lower lobe. Aortic Atherosclerosis (ICD10-I70.0) and Emphysema (ICD10-J43.9). Electronically Signed   By: Darliss Cheney M.D.   On: 04/22/2023 16:41   DG Chest 2 View Result Date: 04/22/2023 CLINICAL DATA:  Shortness of breath. EXAM: CHEST - 2 VIEW COMPARISON:  03/15/2023 FINDINGS: Linear areas of atelectasis/scarring noted at the lung bases, right more than left. Bilateral lung fields are otherwise clear. No dense consolidation or lung collapse. Bilateral costophrenic angles are clear. Normal cardio-mediastinal silhouette. No acute osseous abnormalities. The soft  tissues are within normal limits. IMPRESSION: No active cardiopulmonary disease. Electronically Signed   By: Jules Schick M.D.   On: 04/22/2023 13:58     Medications:    dialysis solution 1.5% low-MG/low-CA      allopurinol  100 mg Oral Daily   calcitRIOL  0.25 mcg Oral Daily   escitalopram  10 mg Oral BH-q7a   feeding supplement  1 Container Oral TID BM   folic acid  1 mg Oral Daily   gabapentin  300 mg Oral QHS   gentamicin cream  1 Application Topical Daily   pantoprazole (PROTONIX) IV  40 mg Intravenous Q12H   rosuvastatin  40 mg Oral BH-q7a   sodium bicarbonate  650 mg Oral TID   sodium chloride flush  3 mL Intravenous Q12H   acetaminophen **OR** acetaminophen, HYDROcodone-acetaminophen, meclizine, methocarbamol, ondansetron **OR** ondansetron (ZOFRAN) IV, mouth rinse, sodium chloride flush  Assessment/ Plan:  Casey French is a 73 y.o.  male with past medical conditions including hypertension, hyperlipidemia, PAD, gout, type 2 diabetes, paroxysmal atrial fibrillation on Eliquis, and end-stage renal disease on peritoneal dialysis.  Patient presents to the emergency department for evaluation of shortness of breath and weakness and has been admitted for Weakness [R53.1] SOB (shortness of breath) [R06.02] Acute GI bleeding [K92.2] Lower abdominal pain [R10.30] Gastrointestinal hemorrhage, unspecified gastrointestinal hemorrhage type [K92.2] Hematuria, unspecified type [R31.9] Nausea and vomiting,  unspecified vomiting type [R11.2]   End-stage renal disease on peritoneal dialysis.   Due to decreased hemoglobin, will continue gentle overnight dialysis treatments with 1.5% dialysate. PD went well overnight, UF . Will defer discharge plan to primary team.   2. Anemia of chronic kidney disease with acute blood loss Lab Results  Component Value Date   HGB 8.4 (L) 04/24/2023  Hemoglobin 7.2 on ED arrival, patient has received blood transfusion.  Recent history of  melena and hematuria.  GI consulted and completed EGD today. Findings include gastritis and duodenal erosions without bleeding. Will adjust Mircera dosing outpatient.   3. Secondary Hyperparathyroidism: with outpatient labs: PTH 960, phosphorus 7.1, calcium 7.4 on 03/28/23.   Lab Results  Component Value Date   CALCIUM 7.3 (L) 04/24/2023   CAION 0.95 (L) 04/05/2023   PHOS 5.5 (H) 04/24/2023    Calcium remains decreased but stable. Continue calcitriol during this admission.  4. Hypertension with chronic kidney disease.  Home regimen includes amlodipine, losartan, and metoprolol.  All currently held.  Blood pressure 122/64.    LOS: 2 Roxie Gueye 3/30/202512:19 PM

## 2023-04-24 NOTE — Progress Notes (Signed)
 Peritoneal Dialysis post treatment note  PD treatment completed.   Patient tolerated treatment well.   PD effluent is yellow/clear. No specimen collected. PD exit site clean, dry and intact.   Patient is awake, oriented and in no acute distress. Report given to bedside nurse.  Post treatment VS:see table below  Total UF removed:  149  Post treatment weight: 58.9kg   04/24/23 0521  Vitals  Temp 99.1 F (37.3 C)  Temp Source Oral  BP 106/70  MAP (mmHg) 82  BP Location Left Arm  BP Method Automatic  Patient Position (if appropriate) Lying  Pulse Rate 89  Pulse Rate Source Monitor  Resp 18  Level of Consciousness  Level of Consciousness Alert  MEWS COLOR  MEWS Score Color Green  Oxygen Therapy  SpO2 98 %  O2 Device Room Air  Patient Activity (if Appropriate) In bed  Pulse Oximetry Type Intermittent  Height and Weight  Weight 58.9 kg  Type of Scale Used Bed  Type of Weight Post-Dialysis  BMI (Calculated) 19.17  MEWS Score  MEWS Temp 0  MEWS Systolic 0  MEWS Pulse 0  MEWS RR 0  MEWS LOC 0  MEWS Score 0    Hulen Shouts RN [facility]

## 2023-04-24 NOTE — Interval H&P Note (Signed)
 History and Physical Interval Note:  04/24/2023 8:42 AM  Casey French  has presented today for surgery, with the diagnosis of melena, anemia.  The various methods of treatment have been discussed with the patient and family. After consideration of risks, benefits and other options for treatment, the patient has consented to  Procedure(s): EGD (ESOPHAGOGASTRODUODENOSCOPY) (N/A) as a surgical intervention.  The patient's history has been reviewed, patient examined, no change in status, stable for surgery.  I have reviewed the patient's chart and labs.  Questions were answered to the patient's satisfaction.     Adeline, Vesta

## 2023-04-24 NOTE — Anesthesia Preprocedure Evaluation (Addendum)
 Anesthesia Evaluation  Patient identified by MRN, date of birth, ID band Patient awake    Reviewed: Allergy & Precautions, NPO status , Patient's Chart, lab work & pertinent test results  History of Anesthesia Complications Negative for: history of anesthetic complications  Airway Mallampati: III  TM Distance: >3 FB Neck ROM: full    Dental  (+) Edentulous Upper, Edentulous Lower   Pulmonary former smoker   Pulmonary exam normal        Cardiovascular hypertension, On Medications (-) angina + CAD, + Past MI and + Cardiac Stents  (-) DOE Normal cardiovascular exam     Neuro/Psych  PSYCHIATRIC DISORDERS  Depression    CVA    GI/Hepatic Neg liver ROS, PUD,GERD  ,,Acute GI bleed   Endo/Other  diabetes    Renal/GU ESRF and DialysisRenal disease  negative genitourinary   Musculoskeletal   Abdominal   Peds  Hematology  (+) Blood dyscrasia, anemia   Anesthesia Other Findings Past Medical History: 06/14/2014: Acute diverticulitis 2003: Acute ST elevation myocardial infarction (STEMI) of inferior  wall (HCC)     Comment:  a.) PCI with Cypher stent to the RCA No date: Anemia of chronic renal failure No date: Aortic atherosclerosis (HCC) No date: Bladder tumor 06/2022: CAP (community acquired pneumonia) No date: Cerebral microvascular disease 2003: Coronary artery disease involving native coronary artery of  native heart without angina pectoris     Comment:  a.) PCI with Cypher stent to the RCA 2003; b.) LHC               08/2003: 100% dRCA, 60% pLAD - med mgmt; c.) LHC/PCI               08/21/2015: occluded RCA at site of prior stent               placement, 70% mLAD (FFR 0.79; 3.5 x 12 mm Xience DES),               50% dLAD (FFR 0.70); d.) MV 11/11/2016: no ischemia No date: Depression No date: Diverticulosis No date: End-stage renal disease on peritoneal dialysis (HCC) No date: GERD (gastroesophageal reflux  disease) 05/15/2019: GI bleed No date: Gout No date: Heart murmur 05/15/2019: Hematemesis No date: Hepatic cyst No date: Hepatomegaly No date: Hyperlipidemia No date: Hypertension 02/11/2007: Lacunar infarction [facility])     Comment:  a.) CT head 02/11/2007: lacunar infarction involving               thalamus and external capsule region on the RIGHT; b.)               MRI brain 02/03/2020: multiple remote lacunar infarcts               involving the RIGHT basal ganglia, RIGHT thalamus, and               LEFT middle cerebellar peduncle No date: Long-term use of aspirin therapy 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 No date: On apixaban therapy 09/2022: Pancolitis (HCC) No date: Paroxysmal atrial fibrillation (HCC)     Comment:  a.) CHA2DS2VASc = 6 (age, HTN, CVA x 2, prior               MI/vascular disease, T2DM) as of 03/11/2023; b.)               rate/rhythm maintained on oral metoprolol succinate;               chronically anticoagulated with dose reduced apixaban  No date: Renal cyst, right 03/2022: Sigmoid diverticulitis No date: T2DM (type 2 diabetes mellitus) (HCC) 03/24/2013: Traumatic skull fracture and resulting subarachnoid  hemorrhage     Comment:  a.) MOI was slipping and falling on ice No date: Unstable angina [facility])  Past Surgical History: 04/05/2023: BLADDER INSTILLATION; N/A     Comment:  Procedure: BLADDER INSTILLATION OF GEMCITABINE;                Surgeon: Riki Altes, MD;  Location: [facility] ORS;                Service: Urology;  Laterality: N/A; 08/13/2020: CAPD INSERTION; N/A     Comment:  Procedure: LAPAROSCOPIC INSERTION CONTINUOUS AMBULATORY               PERITONEAL DIALYSIS  (CAPD) CATHETER;  Surgeon:               Campbell Lerner, MD;  Location: [facility] ORS;  Service:               General;  Laterality: N/A; No date: CARDIAC CATHETERIZATION 07/05/2019: CATARACT EXTRACTION W/PHACO; Right     Comment:  Procedure: CATARACT EXTRACTION PHACO AND INTRAOCULAR                LENS PLACEMENT (IOC) RIGHT DIABETIC;  Surgeon: Galen Manila, MD;  Location: [facility] ORS;  Service:               Ophthalmology;  Laterality: Right;                US00:59.3 [id number] LOT [id number] 04/01/2017: COLONOSCOPY WITH PROPOFOL; N/A     Comment:  Procedure: COLONOSCOPY WITH PROPOFOL;  Surgeon:               Christena Deem, MD;  Location: [facility] END[facility]COPY;                Service: Endoscopy;  Laterality: N/A; 10/2001: CORONARY ANGIOPLASTY WITH STENT PLACEMENT     Comment:  RCA 08/21/2015: CORONARY ANGIOPLASTY WITH STENT PLACEMENT; Left     Comment:  Procedure: CORONARY ANGIOPLASTY WITH STENT PLACEMENT               (mid LAD); Location: [facility]; Surgeon: Willene Hatchet, MD 2015: HAND SURGERY     Comment:  3 screws in right hand 04/22/2020: TOTAL HIP ARTHROPLASTY; Left     Comment:  Procedure: TOTAL HIP ARTHROPLASTY ANTERIOR APPROACH;                Surgeon: Kennedy Bucker, MD;  Location: [facility] ORS;                Service: Orthopedics;  Laterality: Left; 04/05/2023: TRANSURETHRAL RESECTION OF BLADDER TUMOR; N/A     Comment:  Procedure: TURBT (TRANSURETHRAL RESECTION OF BLADDER               TUMOR);  Surgeon: Riki Altes, MD;  Location: [facility]               ORS;  Service: Urology;  Laterality: N/A;  BMI    Body Mass Index: 19.18 kg/m      Reproductive/Obstetrics negative OB ROS                             Anesthesia Physical Anesthesia Plan  ASA: 3  Anesthesia Plan: General   Post-op Pain  Management: Minimal or no pain anticipated   Induction: Intravenous  PONV Risk Score and Plan: 1 and Propofol infusion and TIVA  Airway Management Planned: Natural Airway and Nasal Cannula  Additional Equipment:   Intra-op Plan:   Post-operative Plan:   Informed Consent: I have reviewed the patients History and Physical, chart, labs and discussed the procedure including the risks, benefits and alternatives for the proposed anesthesia  with the patient or authorized representative who has indicated his/her understanding and acceptance.     Dental Advisory Given  Plan Discussed with: Anesthesiologist, CRNA and Surgeon  Anesthesia Plan Comments: (Patient consented for risks of anesthesia including but not limited to:  - adverse reactions to medications - risk of airway placement if required - damage to eyes, teeth, lips or other oral mucosa - nerve damage due to positioning  - sore throat or hoarseness - Damage to heart, brain, nerves, lungs, other parts of body or loss of life  Patient voiced understanding and assent.)       Anesthesia Quick Evaluation

## 2023-04-25 ENCOUNTER — Encounter: Payer: Self-pay | Admitting: Internal Medicine

## 2023-04-25 DIAGNOSIS — N186 End stage renal disease: Secondary | ICD-10-CM | POA: Diagnosis not present

## 2023-04-25 DIAGNOSIS — Z992 Dependence on renal dialysis: Secondary | ICD-10-CM | POA: Diagnosis not present

## 2023-04-26 DIAGNOSIS — Z23 Encounter for immunization: Secondary | ICD-10-CM | POA: Diagnosis not present

## 2023-04-26 DIAGNOSIS — Z992 Dependence on renal dialysis: Secondary | ICD-10-CM | POA: Diagnosis not present

## 2023-04-26 DIAGNOSIS — N186 End stage renal disease: Secondary | ICD-10-CM | POA: Diagnosis not present

## 2023-04-27 DIAGNOSIS — N186 End stage renal disease: Secondary | ICD-10-CM | POA: Diagnosis not present

## 2023-04-27 DIAGNOSIS — Z23 Encounter for immunization: Secondary | ICD-10-CM | POA: Diagnosis not present

## 2023-04-27 DIAGNOSIS — Z992 Dependence on renal dialysis: Secondary | ICD-10-CM | POA: Diagnosis not present

## 2023-04-28 DIAGNOSIS — Z23 Encounter for immunization: Secondary | ICD-10-CM | POA: Diagnosis not present

## 2023-04-28 DIAGNOSIS — Z992 Dependence on renal dialysis: Secondary | ICD-10-CM | POA: Diagnosis not present

## 2023-04-28 DIAGNOSIS — N186 End stage renal disease: Secondary | ICD-10-CM | POA: Diagnosis not present

## 2023-04-29 DIAGNOSIS — N186 End stage renal disease: Secondary | ICD-10-CM | POA: Diagnosis not present

## 2023-04-29 DIAGNOSIS — Z23 Encounter for immunization: Secondary | ICD-10-CM | POA: Diagnosis not present

## 2023-04-29 DIAGNOSIS — Z992 Dependence on renal dialysis: Secondary | ICD-10-CM | POA: Diagnosis not present

## 2023-04-29 DIAGNOSIS — E119 Type 2 diabetes mellitus without complications: Secondary | ICD-10-CM | POA: Diagnosis not present

## 2023-04-29 DIAGNOSIS — Z794 Long term (current) use of insulin: Secondary | ICD-10-CM | POA: Diagnosis not present

## 2023-04-30 DIAGNOSIS — N186 End stage renal disease: Secondary | ICD-10-CM | POA: Diagnosis not present

## 2023-04-30 DIAGNOSIS — Z992 Dependence on renal dialysis: Secondary | ICD-10-CM | POA: Diagnosis not present

## 2023-04-30 DIAGNOSIS — Z23 Encounter for immunization: Secondary | ICD-10-CM | POA: Diagnosis not present

## 2023-05-01 DIAGNOSIS — Z992 Dependence on renal dialysis: Secondary | ICD-10-CM | POA: Diagnosis not present

## 2023-05-01 DIAGNOSIS — N186 End stage renal disease: Secondary | ICD-10-CM | POA: Diagnosis not present

## 2023-05-01 DIAGNOSIS — Z23 Encounter for immunization: Secondary | ICD-10-CM | POA: Diagnosis not present

## 2023-05-02 DIAGNOSIS — Z23 Encounter for immunization: Secondary | ICD-10-CM | POA: Diagnosis not present

## 2023-05-02 DIAGNOSIS — N186 End stage renal disease: Secondary | ICD-10-CM | POA: Diagnosis not present

## 2023-05-02 DIAGNOSIS — Z992 Dependence on renal dialysis: Secondary | ICD-10-CM | POA: Diagnosis not present

## 2023-05-03 ENCOUNTER — Telehealth: Payer: Self-pay

## 2023-05-03 DIAGNOSIS — N186 End stage renal disease: Secondary | ICD-10-CM | POA: Diagnosis not present

## 2023-05-03 DIAGNOSIS — Z23 Encounter for immunization: Secondary | ICD-10-CM | POA: Diagnosis not present

## 2023-05-03 DIAGNOSIS — Z992 Dependence on renal dialysis: Secondary | ICD-10-CM | POA: Diagnosis not present

## 2023-05-03 NOTE — Telephone Encounter (Signed)
 Auth Submission: APPROVED Site of care: [facility] Payer: [employer] medicare Medication & CPT/J Code(s) submitted: BCG J9030 Route of submission (phone, fax, portal): Portal Phone # Fax # Auth type: Buy/Bill PB Units/visits requested: 1000units Reference number: [id number] Approval from: 05/03/23 to 05/01/24    Authorization has been scanned in the media tab.

## 2023-05-04 DIAGNOSIS — Z23 Encounter for immunization: Secondary | ICD-10-CM | POA: Diagnosis not present

## 2023-05-04 DIAGNOSIS — Z992 Dependence on renal dialysis: Secondary | ICD-10-CM | POA: Diagnosis not present

## 2023-05-04 DIAGNOSIS — N186 End stage renal disease: Secondary | ICD-10-CM | POA: Diagnosis not present

## 2023-05-05 DIAGNOSIS — N186 End stage renal disease: Secondary | ICD-10-CM | POA: Diagnosis not present

## 2023-05-05 DIAGNOSIS — Z992 Dependence on renal dialysis: Secondary | ICD-10-CM | POA: Diagnosis not present

## 2023-05-05 DIAGNOSIS — Z23 Encounter for immunization: Secondary | ICD-10-CM | POA: Diagnosis not present

## 2023-05-06 DIAGNOSIS — Z992 Dependence on renal dialysis: Secondary | ICD-10-CM | POA: Diagnosis not present

## 2023-05-06 DIAGNOSIS — Z23 Encounter for immunization: Secondary | ICD-10-CM | POA: Diagnosis not present

## 2023-05-06 DIAGNOSIS — N186 End stage renal disease: Secondary | ICD-10-CM | POA: Diagnosis not present

## 2023-05-07 DIAGNOSIS — Z992 Dependence on renal dialysis: Secondary | ICD-10-CM | POA: Diagnosis not present

## 2023-05-07 DIAGNOSIS — Z23 Encounter for immunization: Secondary | ICD-10-CM | POA: Diagnosis not present

## 2023-05-07 DIAGNOSIS — N186 End stage renal disease: Secondary | ICD-10-CM | POA: Diagnosis not present

## 2023-05-09 DIAGNOSIS — G4700 Insomnia, unspecified: Secondary | ICD-10-CM | POA: Diagnosis not present

## 2023-05-09 DIAGNOSIS — Z992 Dependence on renal dialysis: Secondary | ICD-10-CM | POA: Diagnosis not present

## 2023-05-09 DIAGNOSIS — Z0000 Encounter for general adult medical examination without abnormal findings: Secondary | ICD-10-CM | POA: Diagnosis not present

## 2023-05-09 DIAGNOSIS — I120 Hypertensive chronic kidney disease with stage 5 chronic kidney disease or end stage renal disease: Secondary | ICD-10-CM | POA: Diagnosis not present

## 2023-05-09 DIAGNOSIS — G609 Hereditary and idiopathic neuropathy, unspecified: Secondary | ICD-10-CM | POA: Diagnosis not present

## 2023-05-09 DIAGNOSIS — D631 Anemia in chronic kidney disease: Secondary | ICD-10-CM | POA: Diagnosis not present

## 2023-05-09 DIAGNOSIS — F32A Depression, unspecified: Secondary | ICD-10-CM | POA: Diagnosis not present

## 2023-05-09 DIAGNOSIS — N186 End stage renal disease: Secondary | ICD-10-CM | POA: Diagnosis not present

## 2023-05-10 DIAGNOSIS — N186 End stage renal disease: Secondary | ICD-10-CM | POA: Diagnosis not present

## 2023-05-10 DIAGNOSIS — Z23 Encounter for immunization: Secondary | ICD-10-CM | POA: Diagnosis not present

## 2023-05-10 DIAGNOSIS — Z992 Dependence on renal dialysis: Secondary | ICD-10-CM | POA: Diagnosis not present

## 2023-05-11 DIAGNOSIS — R197 Diarrhea, unspecified: Secondary | ICD-10-CM | POA: Diagnosis not present

## 2023-05-11 DIAGNOSIS — Z992 Dependence on renal dialysis: Secondary | ICD-10-CM | POA: Diagnosis not present

## 2023-05-11 DIAGNOSIS — D649 Anemia, unspecified: Secondary | ICD-10-CM | POA: Diagnosis not present

## 2023-05-11 DIAGNOSIS — N186 End stage renal disease: Secondary | ICD-10-CM | POA: Diagnosis not present

## 2023-05-11 DIAGNOSIS — Z23 Encounter for immunization: Secondary | ICD-10-CM | POA: Diagnosis not present

## 2023-05-11 DIAGNOSIS — K219 Gastro-esophageal reflux disease without esophagitis: Secondary | ICD-10-CM | POA: Diagnosis not present

## 2023-05-11 DIAGNOSIS — R1032 Left lower quadrant pain: Secondary | ICD-10-CM | POA: Diagnosis not present

## 2023-05-11 DIAGNOSIS — R1031 Right lower quadrant pain: Secondary | ICD-10-CM | POA: Diagnosis not present

## 2023-05-12 DIAGNOSIS — Z992 Dependence on renal dialysis: Secondary | ICD-10-CM | POA: Diagnosis not present

## 2023-05-12 DIAGNOSIS — Z23 Encounter for immunization: Secondary | ICD-10-CM | POA: Diagnosis not present

## 2023-05-12 DIAGNOSIS — N186 End stage renal disease: Secondary | ICD-10-CM | POA: Diagnosis not present

## 2023-05-13 DIAGNOSIS — Z23 Encounter for immunization: Secondary | ICD-10-CM | POA: Diagnosis not present

## 2023-05-13 DIAGNOSIS — Z992 Dependence on renal dialysis: Secondary | ICD-10-CM | POA: Diagnosis not present

## 2023-05-13 DIAGNOSIS — N186 End stage renal disease: Secondary | ICD-10-CM | POA: Diagnosis not present

## 2023-05-13 NOTE — Telephone Encounter (Signed)
 Message sent via mychart by provider. I have been unsuccessful in faxing the lab to [facility] due to their systems being down. I called them, it rung but it went busy.

## 2023-05-14 DIAGNOSIS — Z992 Dependence on renal dialysis: Secondary | ICD-10-CM | POA: Diagnosis not present

## 2023-05-14 DIAGNOSIS — Z23 Encounter for immunization: Secondary | ICD-10-CM | POA: Diagnosis not present

## 2023-05-14 DIAGNOSIS — N186 End stage renal disease: Secondary | ICD-10-CM | POA: Diagnosis not present

## 2023-05-16 DIAGNOSIS — N186 End stage renal disease: Secondary | ICD-10-CM | POA: Diagnosis not present

## 2023-05-16 DIAGNOSIS — Z23 Encounter for immunization: Secondary | ICD-10-CM | POA: Diagnosis not present

## 2023-05-16 DIAGNOSIS — Z992 Dependence on renal dialysis: Secondary | ICD-10-CM | POA: Diagnosis not present

## 2023-05-17 DIAGNOSIS — D649 Anemia, unspecified: Secondary | ICD-10-CM | POA: Diagnosis not present

## 2023-05-17 DIAGNOSIS — R1031 Right lower quadrant pain: Secondary | ICD-10-CM | POA: Diagnosis not present

## 2023-05-17 DIAGNOSIS — K219 Gastro-esophageal reflux disease without esophagitis: Secondary | ICD-10-CM | POA: Diagnosis not present

## 2023-05-17 DIAGNOSIS — K5989 Other specified functional intestinal disorders: Secondary | ICD-10-CM | POA: Diagnosis not present

## 2023-05-17 DIAGNOSIS — R1032 Left lower quadrant pain: Secondary | ICD-10-CM | POA: Diagnosis not present

## 2023-05-17 DIAGNOSIS — R197 Diarrhea, unspecified: Secondary | ICD-10-CM | POA: Diagnosis not present

## 2023-05-18 DIAGNOSIS — Z23 Encounter for immunization: Secondary | ICD-10-CM | POA: Diagnosis not present

## 2023-05-18 DIAGNOSIS — N186 End stage renal disease: Secondary | ICD-10-CM | POA: Diagnosis not present

## 2023-05-18 DIAGNOSIS — Z992 Dependence on renal dialysis: Secondary | ICD-10-CM | POA: Diagnosis not present

## 2023-05-19 ENCOUNTER — Telehealth: Payer: Self-pay

## 2023-05-19 NOTE — Telephone Encounter (Signed)
 Sherri, patient's wife called and left a message stating that patient has C diff again and has started on Vancomycin  now and would like to know if this will interact with patient starting BCG next week or ok to proceed?

## 2023-05-20 DIAGNOSIS — Z992 Dependence on renal dialysis: Secondary | ICD-10-CM | POA: Diagnosis not present

## 2023-05-20 DIAGNOSIS — Z23 Encounter for immunization: Secondary | ICD-10-CM | POA: Diagnosis not present

## 2023-05-20 DIAGNOSIS — N186 End stage renal disease: Secondary | ICD-10-CM | POA: Diagnosis not present

## 2023-05-21 DIAGNOSIS — N186 End stage renal disease: Secondary | ICD-10-CM | POA: Diagnosis not present

## 2023-05-21 DIAGNOSIS — Z23 Encounter for immunization: Secondary | ICD-10-CM | POA: Diagnosis not present

## 2023-05-21 DIAGNOSIS — Z992 Dependence on renal dialysis: Secondary | ICD-10-CM | POA: Diagnosis not present

## 2023-05-22 DIAGNOSIS — Z992 Dependence on renal dialysis: Secondary | ICD-10-CM | POA: Diagnosis not present

## 2023-05-22 DIAGNOSIS — Z23 Encounter for immunization: Secondary | ICD-10-CM | POA: Diagnosis not present

## 2023-05-22 DIAGNOSIS — N186 End stage renal disease: Secondary | ICD-10-CM | POA: Diagnosis not present

## 2023-05-23 DIAGNOSIS — Z992 Dependence on renal dialysis: Secondary | ICD-10-CM | POA: Diagnosis not present

## 2023-05-23 DIAGNOSIS — N186 End stage renal disease: Secondary | ICD-10-CM | POA: Diagnosis not present

## 2023-05-23 DIAGNOSIS — Z23 Encounter for immunization: Secondary | ICD-10-CM | POA: Diagnosis not present

## 2023-05-24 ENCOUNTER — Ambulatory Visit: Admitting: Urology

## 2023-05-24 DIAGNOSIS — N186 End stage renal disease: Secondary | ICD-10-CM | POA: Diagnosis not present

## 2023-05-24 DIAGNOSIS — Z23 Encounter for immunization: Secondary | ICD-10-CM | POA: Diagnosis not present

## 2023-05-24 DIAGNOSIS — Z992 Dependence on renal dialysis: Secondary | ICD-10-CM | POA: Diagnosis not present

## 2023-05-24 NOTE — Telephone Encounter (Signed)
 Called both numbers in the chart, left detailed message to call back to re schedule. Has an appt today for this and hopefully will get my voicemail before heading out

## 2023-05-25 DIAGNOSIS — Z992 Dependence on renal dialysis: Secondary | ICD-10-CM | POA: Diagnosis not present

## 2023-05-25 DIAGNOSIS — Z23 Encounter for immunization: Secondary | ICD-10-CM | POA: Diagnosis not present

## 2023-05-25 DIAGNOSIS — N186 End stage renal disease: Secondary | ICD-10-CM | POA: Diagnosis not present

## 2023-05-26 DIAGNOSIS — N186 End stage renal disease: Secondary | ICD-10-CM | POA: Diagnosis not present

## 2023-05-26 DIAGNOSIS — Z992 Dependence on renal dialysis: Secondary | ICD-10-CM | POA: Diagnosis not present

## 2023-05-27 DIAGNOSIS — N186 End stage renal disease: Secondary | ICD-10-CM | POA: Diagnosis not present

## 2023-05-27 DIAGNOSIS — Z992 Dependence on renal dialysis: Secondary | ICD-10-CM | POA: Diagnosis not present

## 2023-05-29 DIAGNOSIS — Z992 Dependence on renal dialysis: Secondary | ICD-10-CM | POA: Diagnosis not present

## 2023-05-29 DIAGNOSIS — N186 End stage renal disease: Secondary | ICD-10-CM | POA: Diagnosis not present

## 2023-05-30 DIAGNOSIS — E785 Hyperlipidemia, unspecified: Secondary | ICD-10-CM | POA: Diagnosis not present

## 2023-05-30 DIAGNOSIS — N186 End stage renal disease: Secondary | ICD-10-CM | POA: Diagnosis not present

## 2023-05-30 DIAGNOSIS — J439 Emphysema, unspecified: Secondary | ICD-10-CM | POA: Diagnosis not present

## 2023-05-30 DIAGNOSIS — G4700 Insomnia, unspecified: Secondary | ICD-10-CM | POA: Diagnosis not present

## 2023-05-30 DIAGNOSIS — F4322 Adjustment disorder with anxiety: Secondary | ICD-10-CM | POA: Diagnosis not present

## 2023-05-30 DIAGNOSIS — I4891 Unspecified atrial fibrillation: Secondary | ICD-10-CM | POA: Diagnosis not present

## 2023-05-30 DIAGNOSIS — E46 Unspecified protein-calorie malnutrition: Secondary | ICD-10-CM | POA: Diagnosis not present

## 2023-05-30 DIAGNOSIS — I252 Old myocardial infarction: Secondary | ICD-10-CM | POA: Diagnosis not present

## 2023-05-30 DIAGNOSIS — Z7982 Long term (current) use of aspirin: Secondary | ICD-10-CM | POA: Diagnosis not present

## 2023-05-30 DIAGNOSIS — Z5941 Food insecurity: Secondary | ICD-10-CM | POA: Diagnosis not present

## 2023-05-30 DIAGNOSIS — N2581 Secondary hyperparathyroidism of renal origin: Secondary | ICD-10-CM | POA: Diagnosis not present

## 2023-05-30 DIAGNOSIS — Z7901 Long term (current) use of anticoagulants: Secondary | ICD-10-CM | POA: Diagnosis not present

## 2023-05-30 DIAGNOSIS — I509 Heart failure, unspecified: Secondary | ICD-10-CM | POA: Diagnosis not present

## 2023-05-30 DIAGNOSIS — Z5986 Financial insecurity: Secondary | ICD-10-CM | POA: Diagnosis not present

## 2023-05-30 DIAGNOSIS — Z681 Body mass index (BMI) 19 or less, adult: Secondary | ICD-10-CM | POA: Diagnosis not present

## 2023-05-30 DIAGNOSIS — K219 Gastro-esophageal reflux disease without esophagitis: Secondary | ICD-10-CM | POA: Diagnosis not present

## 2023-05-30 DIAGNOSIS — I700 Atherosclerosis of aorta: Secondary | ICD-10-CM | POA: Diagnosis not present

## 2023-05-30 DIAGNOSIS — H348392 Tributary (branch) retinal vein occlusion, unspecified eye, stable: Secondary | ICD-10-CM | POA: Diagnosis not present

## 2023-05-30 DIAGNOSIS — D6869 Other thrombophilia: Secondary | ICD-10-CM | POA: Diagnosis not present

## 2023-05-30 DIAGNOSIS — Z5948 Other specified lack of adequate food: Secondary | ICD-10-CM | POA: Diagnosis not present

## 2023-05-30 DIAGNOSIS — H3530 Unspecified macular degeneration: Secondary | ICD-10-CM | POA: Diagnosis not present

## 2023-05-30 DIAGNOSIS — F325 Major depressive disorder, single episode, in full remission: Secondary | ICD-10-CM | POA: Diagnosis not present

## 2023-05-30 DIAGNOSIS — D696 Thrombocytopenia, unspecified: Secondary | ICD-10-CM | POA: Diagnosis not present

## 2023-05-30 DIAGNOSIS — R160 Hepatomegaly, not elsewhere classified: Secondary | ICD-10-CM | POA: Diagnosis not present

## 2023-05-30 DIAGNOSIS — I25119 Atherosclerotic heart disease of native coronary artery with unspecified angina pectoris: Secondary | ICD-10-CM | POA: Diagnosis not present

## 2023-05-30 DIAGNOSIS — I679 Cerebrovascular disease, unspecified: Secondary | ICD-10-CM | POA: Diagnosis not present

## 2023-05-30 DIAGNOSIS — K5190 Ulcerative colitis, unspecified, without complications: Secondary | ICD-10-CM | POA: Diagnosis not present

## 2023-05-30 DIAGNOSIS — I132 Hypertensive heart and chronic kidney disease with heart failure and with stage 5 chronic kidney disease, or end stage renal disease: Secondary | ICD-10-CM | POA: Diagnosis not present

## 2023-05-30 DIAGNOSIS — M109 Gout, unspecified: Secondary | ICD-10-CM | POA: Diagnosis not present

## 2023-05-31 ENCOUNTER — Ambulatory Visit: Admitting: Urology

## 2023-05-31 DIAGNOSIS — N186 End stage renal disease: Secondary | ICD-10-CM | POA: Diagnosis not present

## 2023-05-31 DIAGNOSIS — Z992 Dependence on renal dialysis: Secondary | ICD-10-CM | POA: Diagnosis not present

## 2023-06-03 DIAGNOSIS — N186 End stage renal disease: Secondary | ICD-10-CM | POA: Diagnosis not present

## 2023-06-03 DIAGNOSIS — Z992 Dependence on renal dialysis: Secondary | ICD-10-CM | POA: Diagnosis not present

## 2023-06-04 DIAGNOSIS — Z992 Dependence on renal dialysis: Secondary | ICD-10-CM | POA: Diagnosis not present

## 2023-06-04 DIAGNOSIS — N186 End stage renal disease: Secondary | ICD-10-CM | POA: Diagnosis not present

## 2023-06-05 DIAGNOSIS — N186 End stage renal disease: Secondary | ICD-10-CM | POA: Diagnosis not present

## 2023-06-05 DIAGNOSIS — Z992 Dependence on renal dialysis: Secondary | ICD-10-CM | POA: Diagnosis not present

## 2023-06-06 DIAGNOSIS — N186 End stage renal disease: Secondary | ICD-10-CM | POA: Diagnosis not present

## 2023-06-06 DIAGNOSIS — Z992 Dependence on renal dialysis: Secondary | ICD-10-CM | POA: Diagnosis not present

## 2023-06-07 ENCOUNTER — Ambulatory Visit: Admitting: Urology

## 2023-06-07 DIAGNOSIS — Z992 Dependence on renal dialysis: Secondary | ICD-10-CM | POA: Diagnosis not present

## 2023-06-07 DIAGNOSIS — N186 End stage renal disease: Secondary | ICD-10-CM | POA: Diagnosis not present

## 2023-06-08 DIAGNOSIS — N186 End stage renal disease: Secondary | ICD-10-CM | POA: Diagnosis not present

## 2023-06-08 DIAGNOSIS — Z992 Dependence on renal dialysis: Secondary | ICD-10-CM | POA: Diagnosis not present

## 2023-06-10 DIAGNOSIS — N186 End stage renal disease: Secondary | ICD-10-CM | POA: Diagnosis not present

## 2023-06-10 DIAGNOSIS — Z992 Dependence on renal dialysis: Secondary | ICD-10-CM | POA: Diagnosis not present

## 2023-06-12 DIAGNOSIS — N186 End stage renal disease: Secondary | ICD-10-CM | POA: Diagnosis not present

## 2023-06-12 DIAGNOSIS — Z992 Dependence on renal dialysis: Secondary | ICD-10-CM | POA: Diagnosis not present

## 2023-06-13 DIAGNOSIS — Z992 Dependence on renal dialysis: Secondary | ICD-10-CM | POA: Diagnosis not present

## 2023-06-13 DIAGNOSIS — N186 End stage renal disease: Secondary | ICD-10-CM | POA: Diagnosis not present

## 2023-06-14 ENCOUNTER — Ambulatory Visit: Admitting: Urology

## 2023-06-14 DIAGNOSIS — N186 End stage renal disease: Secondary | ICD-10-CM | POA: Diagnosis not present

## 2023-06-14 DIAGNOSIS — Z992 Dependence on renal dialysis: Secondary | ICD-10-CM | POA: Diagnosis not present

## 2023-06-15 ENCOUNTER — Telehealth: Payer: Self-pay

## 2023-06-15 NOTE — Telephone Encounter (Signed)
 Pt's wife LM on triage line stating she is trying to get help for treating the pt's c-diff.  Called wife back, no answer. Left detailed message informing her that our office does not treat c-diff as a urology practice.

## 2023-06-16 DIAGNOSIS — N186 End stage renal disease: Secondary | ICD-10-CM | POA: Diagnosis not present

## 2023-06-16 DIAGNOSIS — Z992 Dependence on renal dialysis: Secondary | ICD-10-CM | POA: Diagnosis not present

## 2023-06-17 DIAGNOSIS — N186 End stage renal disease: Secondary | ICD-10-CM | POA: Diagnosis not present

## 2023-06-17 DIAGNOSIS — Z992 Dependence on renal dialysis: Secondary | ICD-10-CM | POA: Diagnosis not present

## 2023-06-18 DIAGNOSIS — Z992 Dependence on renal dialysis: Secondary | ICD-10-CM | POA: Diagnosis not present

## 2023-06-18 DIAGNOSIS — N186 End stage renal disease: Secondary | ICD-10-CM | POA: Diagnosis not present

## 2023-06-19 DIAGNOSIS — Z992 Dependence on renal dialysis: Secondary | ICD-10-CM | POA: Diagnosis not present

## 2023-06-19 DIAGNOSIS — N186 End stage renal disease: Secondary | ICD-10-CM | POA: Diagnosis not present

## 2023-06-21 ENCOUNTER — Ambulatory Visit: Admitting: Urology

## 2023-06-21 DIAGNOSIS — Z992 Dependence on renal dialysis: Secondary | ICD-10-CM | POA: Diagnosis not present

## 2023-06-21 DIAGNOSIS — N186 End stage renal disease: Secondary | ICD-10-CM | POA: Diagnosis not present

## 2023-06-22 ENCOUNTER — Telehealth: Payer: Self-pay

## 2023-06-22 DIAGNOSIS — N186 End stage renal disease: Secondary | ICD-10-CM | POA: Diagnosis not present

## 2023-06-22 DIAGNOSIS — Z992 Dependence on renal dialysis: Secondary | ICD-10-CM | POA: Diagnosis not present

## 2023-06-22 NOTE — Telephone Encounter (Signed)
 Casey French, Casey French's wife, called to let us  know that Casey French is starting another medication for Cdiff treatment that he has to take 1 tablet twice daily for 10 days and then will take another medication after that 1 tablet every other day for 20 days.   Casey French and Casey French had questions.  Can they go ahead and cancel their BCG appointments and call back once he is done with treatments and reschedule at that time? As of now he has follow up with GI 08/25/2023. Any other recommendations in the meantime?  Also, they are worried that waiting on BCG is going to harm him with his bladder cancer?  Advised Casey French I would send message to [facility] and Dr Cherylene Corrente and we would call back once we have feedback to give them.

## 2023-06-23 NOTE — Telephone Encounter (Signed)
 Left detailed message advising Casey French of [name]'s feedback and to let us  know how patient is doing and how we should proceed

## 2023-06-23 NOTE — Telephone Encounter (Signed)
 DIFICID 200MG 

## 2023-06-24 DIAGNOSIS — Z992 Dependence on renal dialysis: Secondary | ICD-10-CM | POA: Diagnosis not present

## 2023-06-24 DIAGNOSIS — N186 End stage renal disease: Secondary | ICD-10-CM | POA: Diagnosis not present

## 2023-06-25 DIAGNOSIS — N186 End stage renal disease: Secondary | ICD-10-CM | POA: Diagnosis not present

## 2023-06-25 DIAGNOSIS — Z992 Dependence on renal dialysis: Secondary | ICD-10-CM | POA: Diagnosis not present

## 2023-06-25 NOTE — Progress Notes (Deleted)
 BCG Bladder Instillation  BCG # 1/6  Due to Bladder Cancer patient is present today for a BCG treatment. Patient was cleaned and prepped in a sterile fashion with betadine . A 14 FR catheter was inserted, urine return was noted ***ml, urine was *** in color.  50ml of reconstituted BCG was instilled into the bladder. The catheter was then removed. Patient tolerated well, no complications were noted  Performed by: Matilde Son, PA-C and ***  Follow up/ Additional notes: One week for #2/6 BCG

## 2023-06-26 DIAGNOSIS — Z992 Dependence on renal dialysis: Secondary | ICD-10-CM | POA: Diagnosis not present

## 2023-06-26 DIAGNOSIS — N186 End stage renal disease: Secondary | ICD-10-CM | POA: Diagnosis not present

## 2023-06-27 DIAGNOSIS — N186 End stage renal disease: Secondary | ICD-10-CM | POA: Diagnosis not present

## 2023-06-27 DIAGNOSIS — Z992 Dependence on renal dialysis: Secondary | ICD-10-CM | POA: Diagnosis not present

## 2023-06-28 ENCOUNTER — Ambulatory Visit: Admitting: Urology

## 2023-06-28 DIAGNOSIS — C674 Malignant neoplasm of posterior wall of bladder: Secondary | ICD-10-CM

## 2023-06-29 DIAGNOSIS — Z992 Dependence on renal dialysis: Secondary | ICD-10-CM | POA: Diagnosis not present

## 2023-06-29 DIAGNOSIS — N186 End stage renal disease: Secondary | ICD-10-CM | POA: Diagnosis not present

## 2023-06-30 DIAGNOSIS — N186 End stage renal disease: Secondary | ICD-10-CM | POA: Diagnosis not present

## 2023-06-30 DIAGNOSIS — Z992 Dependence on renal dialysis: Secondary | ICD-10-CM | POA: Diagnosis not present

## 2023-07-01 DIAGNOSIS — Z992 Dependence on renal dialysis: Secondary | ICD-10-CM | POA: Diagnosis not present

## 2023-07-01 DIAGNOSIS — N186 End stage renal disease: Secondary | ICD-10-CM | POA: Diagnosis not present

## 2023-07-02 DIAGNOSIS — N186 End stage renal disease: Secondary | ICD-10-CM | POA: Diagnosis not present

## 2023-07-02 DIAGNOSIS — Z992 Dependence on renal dialysis: Secondary | ICD-10-CM | POA: Diagnosis not present

## 2023-07-03 DIAGNOSIS — N186 End stage renal disease: Secondary | ICD-10-CM | POA: Diagnosis not present

## 2023-07-03 DIAGNOSIS — Z992 Dependence on renal dialysis: Secondary | ICD-10-CM | POA: Diagnosis not present

## 2023-07-04 DIAGNOSIS — Z992 Dependence on renal dialysis: Secondary | ICD-10-CM | POA: Diagnosis not present

## 2023-07-04 DIAGNOSIS — N186 End stage renal disease: Secondary | ICD-10-CM | POA: Diagnosis not present

## 2023-07-04 NOTE — Progress Notes (Unsigned)
 BCG Bladder Instillation  BCG # 1/6  Due to Bladder Cancer patient is present today for a BCG treatment. Patient was cleaned and prepped in a sterile fashion with betadine . A 14 FR catheter was inserted, urine return was noted ***ml, urine was *** in color.  50ml of reconstituted BCG was instilled into the bladder. The catheter was then removed. Patient tolerated well, no complications were noted  Performed by: Matilde Son, PA-C and ***  Follow up/ Additional notes: One week for #2/6 BCG

## 2023-07-05 ENCOUNTER — Ambulatory Visit (INDEPENDENT_AMBULATORY_CARE_PROVIDER_SITE_OTHER): Admitting: Urology

## 2023-07-05 DIAGNOSIS — C674 Malignant neoplasm of posterior wall of bladder: Secondary | ICD-10-CM

## 2023-07-05 DIAGNOSIS — N186 End stage renal disease: Secondary | ICD-10-CM | POA: Diagnosis not present

## 2023-07-05 DIAGNOSIS — Z992 Dependence on renal dialysis: Secondary | ICD-10-CM | POA: Diagnosis not present

## 2023-07-05 LAB — URINALYSIS, COMPLETE
Bilirubin, UA: NEGATIVE
Ketones, UA: NEGATIVE
Leukocytes,UA: NEGATIVE
Nitrite, UA: NEGATIVE
Specific Gravity, UA: 1.02 (ref 1.005–1.030)
Urobilinogen, Ur: 0.2 mg/dL (ref 0.2–1.0)
pH, UA: 7 (ref 5.0–7.5)

## 2023-07-05 LAB — MICROSCOPIC EXAMINATION

## 2023-07-05 MED ORDER — BCG LIVE 50 MG IS SUSR
3.2400 mL | Freq: Once | INTRAVESICAL | Status: AC
  Administered 2023-07-05: 81 mg via INTRAVESICAL

## 2023-07-06 DIAGNOSIS — Z992 Dependence on renal dialysis: Secondary | ICD-10-CM | POA: Diagnosis not present

## 2023-07-06 DIAGNOSIS — N186 End stage renal disease: Secondary | ICD-10-CM | POA: Diagnosis not present

## 2023-07-06 NOTE — Patient Instructions (Signed)

## 2023-07-07 DIAGNOSIS — N186 End stage renal disease: Secondary | ICD-10-CM | POA: Diagnosis not present

## 2023-07-07 DIAGNOSIS — Z992 Dependence on renal dialysis: Secondary | ICD-10-CM | POA: Diagnosis not present

## 2023-07-08 DIAGNOSIS — Z992 Dependence on renal dialysis: Secondary | ICD-10-CM | POA: Diagnosis not present

## 2023-07-08 DIAGNOSIS — N186 End stage renal disease: Secondary | ICD-10-CM | POA: Diagnosis not present

## 2023-07-09 DIAGNOSIS — Z992 Dependence on renal dialysis: Secondary | ICD-10-CM | POA: Diagnosis not present

## 2023-07-09 DIAGNOSIS — N186 End stage renal disease: Secondary | ICD-10-CM | POA: Diagnosis not present

## 2023-07-10 DIAGNOSIS — N186 End stage renal disease: Secondary | ICD-10-CM | POA: Diagnosis not present

## 2023-07-10 DIAGNOSIS — Z992 Dependence on renal dialysis: Secondary | ICD-10-CM | POA: Diagnosis not present

## 2023-07-12 ENCOUNTER — Ambulatory Visit: Admitting: Urology

## 2023-07-12 ENCOUNTER — Ambulatory Visit (INDEPENDENT_AMBULATORY_CARE_PROVIDER_SITE_OTHER): Admitting: Physician Assistant

## 2023-07-12 DIAGNOSIS — D494 Neoplasm of unspecified behavior of bladder: Secondary | ICD-10-CM | POA: Diagnosis not present

## 2023-07-12 LAB — URINALYSIS, COMPLETE
Bilirubin, UA: NEGATIVE
Ketones, UA: NEGATIVE
Leukocytes,UA: NEGATIVE
Nitrite, UA: NEGATIVE
Specific Gravity, UA: 1.015 (ref 1.005–1.030)
Urobilinogen, Ur: 0.2 mg/dL (ref 0.2–1.0)
pH, UA: 6.5 (ref 5.0–7.5)

## 2023-07-12 LAB — MICROSCOPIC EXAMINATION: Bacteria, UA: NONE SEEN

## 2023-07-12 MED ORDER — BCG LIVE 50 MG IS SUSR
3.2400 mL | Freq: Once | INTRAVESICAL | Status: AC
  Administered 2023-07-12: 81 mg via INTRAVESICAL

## 2023-07-12 NOTE — Progress Notes (Signed)
 BCG Bladder Instillation  BCG # 2 of 6  Due to Bladder Cancer patient is present today for a BCG treatment. Patient was cleaned and prepped in a sterile fashion with betadine . A 14FR catheter was inserted, urine return was noted 3ml, urine was yellow in color.  50ml of reconstituted BCG was instilled into the bladder. The catheter was then removed. Patient tolerated well, no complications were noted  Performed by: Wilberto Console, PA-C and Jessica Qualls, CMA  Follow up: 1 week

## 2023-07-13 DIAGNOSIS — Z992 Dependence on renal dialysis: Secondary | ICD-10-CM | POA: Diagnosis not present

## 2023-07-13 DIAGNOSIS — N186 End stage renal disease: Secondary | ICD-10-CM | POA: Diagnosis not present

## 2023-07-15 DIAGNOSIS — Z992 Dependence on renal dialysis: Secondary | ICD-10-CM | POA: Diagnosis not present

## 2023-07-15 DIAGNOSIS — N186 End stage renal disease: Secondary | ICD-10-CM | POA: Diagnosis not present

## 2023-07-16 DIAGNOSIS — Z992 Dependence on renal dialysis: Secondary | ICD-10-CM | POA: Diagnosis not present

## 2023-07-16 DIAGNOSIS — N186 End stage renal disease: Secondary | ICD-10-CM | POA: Diagnosis not present

## 2023-07-17 DIAGNOSIS — N186 End stage renal disease: Secondary | ICD-10-CM | POA: Diagnosis not present

## 2023-07-17 DIAGNOSIS — Z992 Dependence on renal dialysis: Secondary | ICD-10-CM | POA: Diagnosis not present

## 2023-07-18 DIAGNOSIS — Z992 Dependence on renal dialysis: Secondary | ICD-10-CM | POA: Diagnosis not present

## 2023-07-18 DIAGNOSIS — N186 End stage renal disease: Secondary | ICD-10-CM | POA: Diagnosis not present

## 2023-07-19 ENCOUNTER — Ambulatory Visit (INDEPENDENT_AMBULATORY_CARE_PROVIDER_SITE_OTHER): Admitting: Urology

## 2023-07-19 DIAGNOSIS — D494 Neoplasm of unspecified behavior of bladder: Secondary | ICD-10-CM

## 2023-07-19 DIAGNOSIS — C679 Malignant neoplasm of bladder, unspecified: Secondary | ICD-10-CM

## 2023-07-19 DIAGNOSIS — N186 End stage renal disease: Secondary | ICD-10-CM | POA: Diagnosis not present

## 2023-07-19 DIAGNOSIS — Z992 Dependence on renal dialysis: Secondary | ICD-10-CM | POA: Diagnosis not present

## 2023-07-19 LAB — MICROSCOPIC EXAMINATION

## 2023-07-19 LAB — URINALYSIS, COMPLETE
Bilirubin, UA: NEGATIVE
Ketones, UA: NEGATIVE
Leukocytes,UA: NEGATIVE
Nitrite, UA: NEGATIVE
Specific Gravity, UA: 1.02 (ref 1.005–1.030)
Urobilinogen, Ur: 0.2 mg/dL (ref 0.2–1.0)
pH, UA: 7 (ref 5.0–7.5)

## 2023-07-19 MED ORDER — BCG LIVE 50 MG IS SUSR
3.2400 mL | Freq: Once | INTRAVESICAL | Status: AC
  Administered 2023-07-19: 81 mg via INTRAVESICAL

## 2023-07-19 NOTE — Addendum Note (Signed)
 Addended by: EFFIE POWELL DEL on: 07/19/2023 02:16 PM   Modules accepted: Orders

## 2023-07-19 NOTE — Progress Notes (Signed)
 BCG Bladder Instillation  BCG # 3 of 6   Due to Bladder Cancer patient is present today for a BCG treatment. Patient was cleaned and prepped in a sterile fashion with betadine . A 14 FR catheter was inserted, urine return was noted 0 ml.  50ml of reconstituted BCG was instilled into the bladder. The catheter was then removed. Patient tolerated well, no complications were noted  Performed by: CLOTILDA CORNWALL, PA-C and Powell Sink, CMA   Follow up/ Additional notes: One week for # 4 of 6 BCG

## 2023-07-20 DIAGNOSIS — N186 End stage renal disease: Secondary | ICD-10-CM | POA: Diagnosis not present

## 2023-07-20 DIAGNOSIS — Z992 Dependence on renal dialysis: Secondary | ICD-10-CM | POA: Diagnosis not present

## 2023-07-21 DIAGNOSIS — Z992 Dependence on renal dialysis: Secondary | ICD-10-CM | POA: Diagnosis not present

## 2023-07-21 DIAGNOSIS — N186 End stage renal disease: Secondary | ICD-10-CM | POA: Diagnosis not present

## 2023-07-22 DIAGNOSIS — N186 End stage renal disease: Secondary | ICD-10-CM | POA: Diagnosis not present

## 2023-07-22 DIAGNOSIS — Z992 Dependence on renal dialysis: Secondary | ICD-10-CM | POA: Diagnosis not present

## 2023-07-23 DIAGNOSIS — Z992 Dependence on renal dialysis: Secondary | ICD-10-CM | POA: Diagnosis not present

## 2023-07-23 DIAGNOSIS — N186 End stage renal disease: Secondary | ICD-10-CM | POA: Diagnosis not present

## 2023-07-24 DIAGNOSIS — Z992 Dependence on renal dialysis: Secondary | ICD-10-CM | POA: Diagnosis not present

## 2023-07-24 DIAGNOSIS — N186 End stage renal disease: Secondary | ICD-10-CM | POA: Diagnosis not present

## 2023-07-25 DIAGNOSIS — N186 End stage renal disease: Secondary | ICD-10-CM | POA: Diagnosis not present

## 2023-07-25 DIAGNOSIS — Z992 Dependence on renal dialysis: Secondary | ICD-10-CM | POA: Diagnosis not present

## 2023-07-26 ENCOUNTER — Ambulatory Visit (INDEPENDENT_AMBULATORY_CARE_PROVIDER_SITE_OTHER): Admitting: Urology

## 2023-07-26 DIAGNOSIS — C679 Malignant neoplasm of bladder, unspecified: Secondary | ICD-10-CM | POA: Diagnosis not present

## 2023-07-26 DIAGNOSIS — D494 Neoplasm of unspecified behavior of bladder: Secondary | ICD-10-CM

## 2023-07-26 DIAGNOSIS — N186 End stage renal disease: Secondary | ICD-10-CM | POA: Diagnosis not present

## 2023-07-26 DIAGNOSIS — Z992 Dependence on renal dialysis: Secondary | ICD-10-CM | POA: Diagnosis not present

## 2023-07-26 LAB — URINALYSIS, COMPLETE
Bilirubin, UA: NEGATIVE
Ketones, UA: NEGATIVE
Leukocytes,UA: NEGATIVE
Nitrite, UA: NEGATIVE
Specific Gravity, UA: 1.015 (ref 1.005–1.030)
Urobilinogen, Ur: 0.2 mg/dL (ref 0.2–1.0)
pH, UA: 8 — ABNORMAL HIGH (ref 5.0–7.5)

## 2023-07-26 LAB — MICROSCOPIC EXAMINATION

## 2023-07-26 MED ORDER — BCG LIVE 50 MG IS SUSR
3.2400 mL | Freq: Once | INTRAVESICAL | Status: AC
  Administered 2023-07-26: 81 mg via INTRAVESICAL

## 2023-07-26 NOTE — Addendum Note (Signed)
 Addended by: DEBBY LAYMON HERO on: 07/26/2023 10:58 AM   Modules accepted: Orders

## 2023-07-26 NOTE — Progress Notes (Signed)
 BCG Bladder Instillation  BCG # 4 of 6   Due to Bladder Cancer patient is present today for a BCG treatment. Patient was cleaned and prepped in a sterile fashion with betadine . A 14 FR catheter was inserted, urine return was noted 50 ml, urine was yellow in color.  50ml of reconstituted BCG was instilled into the bladder. The catheter was then removed. Patient tolerated well, no complications were noted  Performed by: CLOTILDA CORNWALL, PA-C and Laymon Ned, CMA    Follow up/ Additional notes: One week for # 5 of 6 BCG

## 2023-07-27 DIAGNOSIS — N186 End stage renal disease: Secondary | ICD-10-CM | POA: Diagnosis not present

## 2023-07-27 DIAGNOSIS — Z992 Dependence on renal dialysis: Secondary | ICD-10-CM | POA: Diagnosis not present

## 2023-07-28 DIAGNOSIS — N186 End stage renal disease: Secondary | ICD-10-CM | POA: Diagnosis not present

## 2023-07-28 DIAGNOSIS — Z992 Dependence on renal dialysis: Secondary | ICD-10-CM | POA: Diagnosis not present

## 2023-07-29 DIAGNOSIS — N186 End stage renal disease: Secondary | ICD-10-CM | POA: Diagnosis not present

## 2023-07-29 DIAGNOSIS — Z992 Dependence on renal dialysis: Secondary | ICD-10-CM | POA: Diagnosis not present

## 2023-07-31 DIAGNOSIS — Z992 Dependence on renal dialysis: Secondary | ICD-10-CM | POA: Diagnosis not present

## 2023-07-31 DIAGNOSIS — N186 End stage renal disease: Secondary | ICD-10-CM | POA: Diagnosis not present

## 2023-08-01 DIAGNOSIS — N186 End stage renal disease: Secondary | ICD-10-CM | POA: Diagnosis not present

## 2023-08-01 DIAGNOSIS — E119 Type 2 diabetes mellitus without complications: Secondary | ICD-10-CM | POA: Diagnosis not present

## 2023-08-01 DIAGNOSIS — Z794 Long term (current) use of insulin: Secondary | ICD-10-CM | POA: Diagnosis not present

## 2023-08-01 DIAGNOSIS — Z79899 Other long term (current) drug therapy: Secondary | ICD-10-CM | POA: Diagnosis not present

## 2023-08-01 DIAGNOSIS — Z992 Dependence on renal dialysis: Secondary | ICD-10-CM | POA: Diagnosis not present

## 2023-08-01 DIAGNOSIS — Z5181 Encounter for therapeutic drug level monitoring: Secondary | ICD-10-CM | POA: Diagnosis not present

## 2023-08-01 DIAGNOSIS — E785 Hyperlipidemia, unspecified: Secondary | ICD-10-CM | POA: Diagnosis not present

## 2023-08-02 ENCOUNTER — Ambulatory Visit: Admitting: Urology

## 2023-08-02 DIAGNOSIS — Z992 Dependence on renal dialysis: Secondary | ICD-10-CM | POA: Diagnosis not present

## 2023-08-02 DIAGNOSIS — N186 End stage renal disease: Secondary | ICD-10-CM | POA: Diagnosis not present

## 2023-08-02 NOTE — Progress Notes (Deleted)
 BCG Bladder Instillation  BCG # 5 of 6  Due to Bladder Cancer patient is present today for a BCG treatment. Patient was cleaned and prepped in a sterile fashion with betadine . A 14 FR catheter was inserted, urine return was noted ***ml, urine was *** in color.  50ml of reconstituted BCG was instilled into the bladder. The catheter was then removed. Patient tolerated well, no complications were noted  Performed by: CLOTILDA CORNWALL, PA-C and ***  Follow up/ Additional notes: One week for # 6 of 6 BCG

## 2023-08-03 DIAGNOSIS — Z992 Dependence on renal dialysis: Secondary | ICD-10-CM | POA: Diagnosis not present

## 2023-08-03 DIAGNOSIS — N186 End stage renal disease: Secondary | ICD-10-CM | POA: Diagnosis not present

## 2023-08-04 DIAGNOSIS — N186 End stage renal disease: Secondary | ICD-10-CM | POA: Diagnosis not present

## 2023-08-04 DIAGNOSIS — Z992 Dependence on renal dialysis: Secondary | ICD-10-CM | POA: Diagnosis not present

## 2023-08-05 DIAGNOSIS — N186 End stage renal disease: Secondary | ICD-10-CM | POA: Diagnosis not present

## 2023-08-05 DIAGNOSIS — Z992 Dependence on renal dialysis: Secondary | ICD-10-CM | POA: Diagnosis not present

## 2023-08-06 DIAGNOSIS — Z992 Dependence on renal dialysis: Secondary | ICD-10-CM | POA: Diagnosis not present

## 2023-08-06 DIAGNOSIS — N186 End stage renal disease: Secondary | ICD-10-CM | POA: Diagnosis not present

## 2023-08-07 DIAGNOSIS — N186 End stage renal disease: Secondary | ICD-10-CM | POA: Diagnosis not present

## 2023-08-07 DIAGNOSIS — Z992 Dependence on renal dialysis: Secondary | ICD-10-CM | POA: Diagnosis not present

## 2023-08-08 DIAGNOSIS — N186 End stage renal disease: Secondary | ICD-10-CM | POA: Diagnosis not present

## 2023-08-08 DIAGNOSIS — Z992 Dependence on renal dialysis: Secondary | ICD-10-CM | POA: Diagnosis not present

## 2023-08-08 NOTE — Progress Notes (Unsigned)
 BCG Bladder Instillation  BCG # 5 of 6   Due to Bladder Cancer patient is present today for a BCG treatment. Patient was cl5/6aned and prepped in a sterile fashion with betadine . A 14FR catheter was inserted, urine return was noted 5ml, urine was yellow in color.  50ml of reconstituted BCG was instilled into the bladder. The catheter was then removed. Patient tolerated well, no complications were noted  Performed by: Mathew Pinal RN, Harlene Franks CMA  Follow up/ Additional notes: Follow up in one week for # 6 of 6 BCG

## 2023-08-09 ENCOUNTER — Ambulatory Visit: Admitting: Urology

## 2023-08-09 DIAGNOSIS — N186 End stage renal disease: Secondary | ICD-10-CM | POA: Diagnosis not present

## 2023-08-09 DIAGNOSIS — C679 Malignant neoplasm of bladder, unspecified: Secondary | ICD-10-CM | POA: Diagnosis not present

## 2023-08-09 DIAGNOSIS — Z992 Dependence on renal dialysis: Secondary | ICD-10-CM | POA: Diagnosis not present

## 2023-08-09 DIAGNOSIS — D494 Neoplasm of unspecified behavior of bladder: Secondary | ICD-10-CM | POA: Diagnosis not present

## 2023-08-09 LAB — URINALYSIS, COMPLETE
Bilirubin, UA: NEGATIVE
Ketones, UA: NEGATIVE
Leukocytes,UA: NEGATIVE
Nitrite, UA: NEGATIVE
Specific Gravity, UA: 1.015 (ref 1.005–1.030)
Urobilinogen, Ur: 0.2 mg/dL (ref 0.2–1.0)
pH, UA: 7 (ref 5.0–7.5)

## 2023-08-09 LAB — MICROSCOPIC EXAMINATION

## 2023-08-09 MED ORDER — BCG LIVE 50 MG IS SUSR
3.2400 mL | Freq: Once | INTRAVESICAL | Status: AC
  Administered 2023-08-09: 81 mg via INTRAVESICAL

## 2023-08-10 DIAGNOSIS — Z992 Dependence on renal dialysis: Secondary | ICD-10-CM | POA: Diagnosis not present

## 2023-08-10 DIAGNOSIS — N186 End stage renal disease: Secondary | ICD-10-CM | POA: Diagnosis not present

## 2023-08-11 DIAGNOSIS — N186 End stage renal disease: Secondary | ICD-10-CM | POA: Diagnosis not present

## 2023-08-11 DIAGNOSIS — Z992 Dependence on renal dialysis: Secondary | ICD-10-CM | POA: Diagnosis not present

## 2023-08-12 DIAGNOSIS — N186 End stage renal disease: Secondary | ICD-10-CM | POA: Diagnosis not present

## 2023-08-12 DIAGNOSIS — Z992 Dependence on renal dialysis: Secondary | ICD-10-CM | POA: Diagnosis not present

## 2023-08-13 DIAGNOSIS — N186 End stage renal disease: Secondary | ICD-10-CM | POA: Diagnosis not present

## 2023-08-13 DIAGNOSIS — Z992 Dependence on renal dialysis: Secondary | ICD-10-CM | POA: Diagnosis not present

## 2023-08-14 DIAGNOSIS — N186 End stage renal disease: Secondary | ICD-10-CM | POA: Diagnosis not present

## 2023-08-14 DIAGNOSIS — Z992 Dependence on renal dialysis: Secondary | ICD-10-CM | POA: Diagnosis not present

## 2023-08-15 DIAGNOSIS — Z992 Dependence on renal dialysis: Secondary | ICD-10-CM | POA: Diagnosis not present

## 2023-08-15 DIAGNOSIS — N186 End stage renal disease: Secondary | ICD-10-CM | POA: Diagnosis not present

## 2023-08-16 ENCOUNTER — Ambulatory Visit (INDEPENDENT_AMBULATORY_CARE_PROVIDER_SITE_OTHER): Admitting: Urology

## 2023-08-16 VITALS — BP 155/82 | HR 87 | Ht 69.0 in | Wt 125.0 lb

## 2023-08-16 DIAGNOSIS — D494 Neoplasm of unspecified behavior of bladder: Secondary | ICD-10-CM

## 2023-08-16 NOTE — Progress Notes (Signed)
 BCG Bladder Instillation  BCG # 6 of 6  Due to Bladder Cancer patient is present today for a BCG treatment. Patient was cleaned and prepped in a sterile fashion with betadine . A 14 FR catheter was inserted, urine return was noted 5 ml, urine was yellow in color.  50ml of reconstituted BCG was instilled into the bladder. The catheter was then removed. Patient tolerated well, no complications were noted  Performed by: CLOTILDA CORNWALL, PA-C and Gordan Beams, CMA   Follow up/ Additional notes: He will need a follow-up cystoscopy with Dr. Twylla in 6 to 8 weeks

## 2023-08-18 DIAGNOSIS — N186 End stage renal disease: Secondary | ICD-10-CM | POA: Diagnosis not present

## 2023-08-18 DIAGNOSIS — Z992 Dependence on renal dialysis: Secondary | ICD-10-CM | POA: Diagnosis not present

## 2023-08-19 DIAGNOSIS — N186 End stage renal disease: Secondary | ICD-10-CM | POA: Diagnosis not present

## 2023-08-19 DIAGNOSIS — Z992 Dependence on renal dialysis: Secondary | ICD-10-CM | POA: Diagnosis not present

## 2023-08-20 DIAGNOSIS — N186 End stage renal disease: Secondary | ICD-10-CM | POA: Diagnosis not present

## 2023-08-20 DIAGNOSIS — Z992 Dependence on renal dialysis: Secondary | ICD-10-CM | POA: Diagnosis not present

## 2023-08-21 DIAGNOSIS — Z992 Dependence on renal dialysis: Secondary | ICD-10-CM | POA: Diagnosis not present

## 2023-08-21 DIAGNOSIS — N186 End stage renal disease: Secondary | ICD-10-CM | POA: Diagnosis not present

## 2023-08-22 DIAGNOSIS — N186 End stage renal disease: Secondary | ICD-10-CM | POA: Diagnosis not present

## 2023-08-22 DIAGNOSIS — Z992 Dependence on renal dialysis: Secondary | ICD-10-CM | POA: Diagnosis not present

## 2023-08-23 DIAGNOSIS — Z992 Dependence on renal dialysis: Secondary | ICD-10-CM | POA: Diagnosis not present

## 2023-08-23 DIAGNOSIS — N186 End stage renal disease: Secondary | ICD-10-CM | POA: Diagnosis not present

## 2023-08-24 DIAGNOSIS — N186 End stage renal disease: Secondary | ICD-10-CM | POA: Diagnosis not present

## 2023-08-24 DIAGNOSIS — Z992 Dependence on renal dialysis: Secondary | ICD-10-CM | POA: Diagnosis not present

## 2023-08-24 NOTE — Progress Notes (Signed)
 BCG Bladder Instillation  BCG # 5 of 6   Due to Bladder Cancer patient is present today for a BCG treatment. Patient was cl5/6aned and prepped in a sterile fashion with betadine . A 14FR catheter was inserted, urine return was noted 5ml, urine was yellow in color.  50ml of reconstituted BCG was instilled into the bladder. The catheter was then removed. Patient tolerated well, no complications were noted  Performed by: Mathew Pinal RN, Harlene Franks CMA  Follow up/ Additional notes: Follow up in one week for # 6 of 6 BCG

## 2023-08-25 DIAGNOSIS — Z992 Dependence on renal dialysis: Secondary | ICD-10-CM | POA: Diagnosis not present

## 2023-08-25 DIAGNOSIS — N186 End stage renal disease: Secondary | ICD-10-CM | POA: Diagnosis not present

## 2023-08-25 DIAGNOSIS — D649 Anemia, unspecified: Secondary | ICD-10-CM | POA: Diagnosis not present

## 2023-08-26 DIAGNOSIS — N186 End stage renal disease: Secondary | ICD-10-CM | POA: Diagnosis not present

## 2023-08-26 DIAGNOSIS — Z992 Dependence on renal dialysis: Secondary | ICD-10-CM | POA: Diagnosis not present

## 2023-08-27 DIAGNOSIS — Z992 Dependence on renal dialysis: Secondary | ICD-10-CM | POA: Diagnosis not present

## 2023-08-27 DIAGNOSIS — N186 End stage renal disease: Secondary | ICD-10-CM | POA: Diagnosis not present

## 2023-08-28 DIAGNOSIS — N186 End stage renal disease: Secondary | ICD-10-CM | POA: Diagnosis not present

## 2023-08-28 DIAGNOSIS — Z992 Dependence on renal dialysis: Secondary | ICD-10-CM | POA: Diagnosis not present

## 2023-08-29 DIAGNOSIS — R0602 Shortness of breath: Secondary | ICD-10-CM | POA: Diagnosis not present

## 2023-08-29 DIAGNOSIS — I2119 ST elevation (STEMI) myocardial infarction involving other coronary artery of inferior wall: Secondary | ICD-10-CM | POA: Diagnosis not present

## 2023-08-29 DIAGNOSIS — N186 End stage renal disease: Secondary | ICD-10-CM | POA: Diagnosis not present

## 2023-08-29 DIAGNOSIS — I480 Paroxysmal atrial fibrillation: Secondary | ICD-10-CM | POA: Diagnosis not present

## 2023-08-29 DIAGNOSIS — I10 Essential (primary) hypertension: Secondary | ICD-10-CM | POA: Diagnosis not present

## 2023-08-29 DIAGNOSIS — I2510 Atherosclerotic heart disease of native coronary artery without angina pectoris: Secondary | ICD-10-CM | POA: Diagnosis not present

## 2023-08-29 DIAGNOSIS — I160 Hypertensive urgency: Secondary | ICD-10-CM | POA: Diagnosis not present

## 2023-08-29 DIAGNOSIS — Z9889 Other specified postprocedural states: Secondary | ICD-10-CM | POA: Diagnosis not present

## 2023-08-29 DIAGNOSIS — E119 Type 2 diabetes mellitus without complications: Secondary | ICD-10-CM | POA: Diagnosis not present

## 2023-08-29 DIAGNOSIS — Z992 Dependence on renal dialysis: Secondary | ICD-10-CM | POA: Diagnosis not present

## 2023-08-29 DIAGNOSIS — I2101 ST elevation (STEMI) myocardial infarction involving left main coronary artery: Secondary | ICD-10-CM | POA: Diagnosis not present

## 2023-08-31 DIAGNOSIS — Z992 Dependence on renal dialysis: Secondary | ICD-10-CM | POA: Diagnosis not present

## 2023-08-31 DIAGNOSIS — N186 End stage renal disease: Secondary | ICD-10-CM | POA: Diagnosis not present

## 2023-09-01 DIAGNOSIS — Z992 Dependence on renal dialysis: Secondary | ICD-10-CM | POA: Diagnosis not present

## 2023-09-01 DIAGNOSIS — N186 End stage renal disease: Secondary | ICD-10-CM | POA: Diagnosis not present

## 2023-09-02 ENCOUNTER — Emergency Department

## 2023-09-02 ENCOUNTER — Observation Stay
Admission: EM | Admit: 2023-09-02 | Discharge: 2023-09-03 | Disposition: A | Discharge status: Home / Self Care | Attending: Internal Medicine | Admitting: Internal Medicine

## 2023-09-02 ENCOUNTER — Other Ambulatory Visit: Payer: Self-pay

## 2023-09-02 DIAGNOSIS — I5033 Acute on chronic diastolic (congestive) heart failure: Secondary | ICD-10-CM

## 2023-09-02 DIAGNOSIS — I160 Hypertensive urgency: Secondary | ICD-10-CM | POA: Diagnosis not present

## 2023-09-02 DIAGNOSIS — I2510 Atherosclerotic heart disease of native coronary artery without angina pectoris: Secondary | ICD-10-CM | POA: Insufficient documentation

## 2023-09-02 DIAGNOSIS — J9601 Acute respiratory failure with hypoxia: Secondary | ICD-10-CM | POA: Diagnosis present

## 2023-09-02 DIAGNOSIS — I509 Heart failure, unspecified: Secondary | ICD-10-CM

## 2023-09-02 DIAGNOSIS — I120 Hypertensive chronic kidney disease with stage 5 chronic kidney disease or end stage renal disease: Secondary | ICD-10-CM | POA: Diagnosis not present

## 2023-09-02 DIAGNOSIS — I480 Paroxysmal atrial fibrillation: Secondary | ICD-10-CM | POA: Diagnosis not present

## 2023-09-02 DIAGNOSIS — Z8551 Personal history of malignant neoplasm of bladder: Secondary | ICD-10-CM | POA: Diagnosis not present

## 2023-09-02 DIAGNOSIS — R0602 Shortness of breath: Secondary | ICD-10-CM | POA: Diagnosis not present

## 2023-09-02 DIAGNOSIS — Z7901 Long term (current) use of anticoagulants: Secondary | ICD-10-CM | POA: Insufficient documentation

## 2023-09-02 DIAGNOSIS — I132 Hypertensive heart and chronic kidney disease with heart failure and with stage 5 chronic kidney disease, or end stage renal disease: Secondary | ICD-10-CM | POA: Insufficient documentation

## 2023-09-02 DIAGNOSIS — I700 Atherosclerosis of aorta: Secondary | ICD-10-CM | POA: Diagnosis not present

## 2023-09-02 DIAGNOSIS — E1122 Type 2 diabetes mellitus with diabetic chronic kidney disease: Secondary | ICD-10-CM | POA: Insufficient documentation

## 2023-09-02 DIAGNOSIS — Z992 Dependence on renal dialysis: Secondary | ICD-10-CM | POA: Diagnosis not present

## 2023-09-02 DIAGNOSIS — Z79899 Other long term (current) drug therapy: Secondary | ICD-10-CM | POA: Insufficient documentation

## 2023-09-02 DIAGNOSIS — J90 Pleural effusion, not elsewhere classified: Secondary | ICD-10-CM | POA: Insufficient documentation

## 2023-09-02 DIAGNOSIS — Z7982 Long term (current) use of aspirin: Secondary | ICD-10-CM | POA: Diagnosis not present

## 2023-09-02 DIAGNOSIS — N186 End stage renal disease: Secondary | ICD-10-CM | POA: Diagnosis not present

## 2023-09-02 DIAGNOSIS — I5032 Chronic diastolic (congestive) heart failure: Secondary | ICD-10-CM | POA: Diagnosis not present

## 2023-09-02 DIAGNOSIS — R0789 Other chest pain: Secondary | ICD-10-CM | POA: Diagnosis not present

## 2023-09-02 DIAGNOSIS — E875 Hyperkalemia: Secondary | ICD-10-CM | POA: Insufficient documentation

## 2023-09-02 DIAGNOSIS — R918 Other nonspecific abnormal finding of lung field: Secondary | ICD-10-CM | POA: Diagnosis not present

## 2023-09-02 LAB — BASIC METABOLIC PANEL WITH GFR
Anion gap: 11 (ref 5–15)
BUN: 64 mg/dL — ABNORMAL HIGH (ref 8–23)
CO2: 18 mmol/L — ABNORMAL LOW (ref 22–32)
Calcium: 7.6 mg/dL — ABNORMAL LOW (ref 8.9–10.3)
Chloride: 114 mmol/L — ABNORMAL HIGH (ref 98–111)
Creatinine, Ser: 7.59 mg/dL — ABNORMAL HIGH (ref 0.61–1.24)
GFR, Estimated: 7 mL/min — ABNORMAL LOW (ref >60)
Glucose, Bld: 81 mg/dL (ref 70–99)
Potassium: 5.5 mmol/L — ABNORMAL HIGH (ref 3.5–5.1)
Sodium: 143 mmol/L (ref 135–145)

## 2023-09-02 LAB — CBC
HCT: 28.1 % — ABNORMAL LOW (ref 39.0–52.0)
Hemoglobin: 8.5 g/dL — ABNORMAL LOW (ref 13.0–17.0)
MCH: 30.9 pg (ref 26.0–34.0)
MCHC: 30.2 g/dL (ref 30.0–36.0)
MCV: 102.2 fL — ABNORMAL HIGH (ref 80.0–100.0)
Platelets: 245 K/uL (ref 150–400)
RBC: 2.75 MIL/uL — ABNORMAL LOW (ref 4.22–5.81)
RDW: 13.4 % (ref 11.5–15.5)
WBC: 7.3 K/uL (ref 4.0–10.5)
nRBC: 0 % (ref 0.0–0.2)

## 2023-09-02 LAB — TROPONIN I (HIGH SENSITIVITY)
Troponin I (High Sensitivity): 12 ng/L (ref <18)
Troponin I (High Sensitivity): 12 ng/L (ref <18)

## 2023-09-02 LAB — CBG MONITORING, ED: Glucose-Capillary: 97 mg/dL (ref 70–99)

## 2023-09-02 MED ORDER — ACETAMINOPHEN 325 MG PO TABS: 650.0000 mg | ORAL_TABLET | ORAL | Status: DC | PRN

## 2023-09-02 MED ORDER — HYDROCODONE-ACETAMINOPHEN 5-325 MG PO TABS
1.0000 | ORAL_TABLET | Freq: Four times a day (QID) | ORAL | Status: DC | PRN
  Administered 2023-09-02 – 2023-09-03 (×2): 1 via ORAL
  Filled 2023-09-02 (×3): qty 1

## 2023-09-02 MED ORDER — SODIUM CHLORIDE 0.9% FLUSH: 3.0000 mL | INTRAVENOUS | Status: DC | PRN

## 2023-09-02 MED ORDER — SODIUM CHLORIDE 0.9% FLUSH
3.0000 mL | Freq: Two times a day (BID) | INTRAVENOUS | Status: DC
  Administered 2023-09-02 – 2023-09-03 (×3): 3 mL via INTRAVENOUS

## 2023-09-02 MED ORDER — ACETAMINOPHEN 325 MG PO TABS: 650.0000 mg | ORAL_TABLET | Freq: Four times a day (QID) | ORAL | Status: DC | PRN

## 2023-09-02 MED ORDER — DEXTROSE 50 % IV SOLN
1.0000 | Freq: Once | INTRAVENOUS | Status: AC
  Administered 2023-09-02: 50 mL via INTRAVENOUS
  Filled 2023-09-02: qty 50

## 2023-09-02 MED ORDER — GABAPENTIN 300 MG PO CAPS
300.0000 mg | ORAL_CAPSULE | Freq: Every day | ORAL | Status: DC
  Administered 2023-09-02: 300 mg via ORAL
  Filled 2023-09-02: qty 1

## 2023-09-02 MED ORDER — CALCITRIOL 0.25 MCG PO CAPS
0.2500 ug | ORAL_CAPSULE | Freq: Every day | ORAL | Status: DC
  Administered 2023-09-03: 0.25 ug via ORAL
  Filled 2023-09-02: qty 1

## 2023-09-02 MED ORDER — LABETALOL HCL 5 MG/ML IV SOLN: 10.0000 mg | INTRAVENOUS | Status: DC | PRN

## 2023-09-02 MED ORDER — ALBUTEROL SULFATE (2.5 MG/3ML) 0.083% IN NEBU
10.0000 mg | INHALATION_SOLUTION | Freq: Once | RESPIRATORY_TRACT | Status: AC
  Administered 2023-09-02: 10 mg via RESPIRATORY_TRACT
  Filled 2023-09-02: qty 12

## 2023-09-02 MED ORDER — PANTOPRAZOLE SODIUM 40 MG PO TBEC
40.0000 mg | DELAYED_RELEASE_TABLET | Freq: Every day | ORAL | Status: DC
  Administered 2023-09-03: 40 mg via ORAL
  Filled 2023-09-02: qty 1

## 2023-09-02 MED ORDER — PATIROMER SORBITEX CALCIUM 8.4 G PO PACK
16.8000 g | PACK | Freq: Once | ORAL | Status: AC
  Administered 2023-09-02: 16.8 g via ORAL
  Filled 2023-09-02: qty 2

## 2023-09-02 MED ORDER — ASPIRIN 81 MG PO TBEC
81.0000 mg | DELAYED_RELEASE_TABLET | Freq: Every day | ORAL | Status: DC
  Administered 2023-09-03: 81 mg via ORAL
  Filled 2023-09-02: qty 1

## 2023-09-02 MED ORDER — ONDANSETRON HCL 4 MG/2ML IJ SOLN
4.0000 mg | Freq: Four times a day (QID) | INTRAMUSCULAR | Status: DC | PRN
  Administered 2023-09-02: 4 mg via INTRAVENOUS
  Filled 2023-09-02: qty 2

## 2023-09-02 MED ORDER — ROSUVASTATIN CALCIUM 10 MG PO TABS
40.0000 mg | ORAL_TABLET | Freq: Every day | ORAL | Status: DC
  Administered 2023-09-03: 40 mg via ORAL
  Filled 2023-09-02: qty 4

## 2023-09-02 MED ORDER — FUROSEMIDE 10 MG/ML IJ SOLN
80.0000 mg | Freq: Once | INTRAMUSCULAR | Status: AC
  Administered 2023-09-02: 80 mg via INTRAVENOUS
  Filled 2023-09-02: qty 8

## 2023-09-02 MED ORDER — DELFLEX-LC/2.5% DEXTROSE 394 MOSM/L IP SOLN
INTRAPERITONEAL | Status: DC
  Filled 2023-09-02 (×2): qty 3000

## 2023-09-02 MED ORDER — APIXABAN 2.5 MG PO TABS
2.5000 mg | ORAL_TABLET | Freq: Two times a day (BID) | ORAL | Status: DC
  Administered 2023-09-02 – 2023-09-03 (×2): 2.5 mg via ORAL
  Filled 2023-09-02 (×2): qty 1

## 2023-09-02 MED ORDER — MECLIZINE HCL 25 MG PO TABS
25.0000 mg | ORAL_TABLET | Freq: Two times a day (BID) | ORAL | Status: DC
  Administered 2023-09-02 – 2023-09-03 (×2): 25 mg via ORAL
  Filled 2023-09-02 (×2): qty 1

## 2023-09-02 MED ORDER — GENTAMICIN SULFATE 0.1 % EX CREA
1.0000 | TOPICAL_CREAM | Freq: Every day | CUTANEOUS | Status: DC
  Administered 2023-09-03: 1 via TOPICAL
  Filled 2023-09-02 (×2): qty 15

## 2023-09-02 MED ORDER — METOPROLOL SUCCINATE ER 50 MG PO TB24
50.0000 mg | ORAL_TABLET | ORAL | Status: DC
  Administered 2023-09-03: 50 mg via ORAL
  Filled 2023-09-02: qty 1

## 2023-09-02 MED ORDER — SODIUM ZIRCONIUM CYCLOSILICATE 10 G PO PACK
10.0000 g | PACK | Freq: Once | ORAL | Status: DC
  Filled 2023-09-02: qty 1

## 2023-09-02 MED ORDER — CALCIUM GLUCONATE 10 % IV SOLN
1.0000 g | Freq: Once | INTRAVENOUS | Status: AC
  Administered 2023-09-02: 1 g via INTRAVENOUS
  Filled 2023-09-02: qty 10

## 2023-09-02 MED ORDER — INSULIN ASPART 100 UNIT/ML IV SOLN
5.0000 [IU] | Freq: Once | INTRAVENOUS | Status: AC
  Administered 2023-09-02: 5 [IU] via INTRAVENOUS
  Filled 2023-09-02: qty 0.05

## 2023-09-02 MED ORDER — ONDANSETRON HCL 4 MG PO TABS: 4.0000 mg | ORAL_TABLET | Freq: Three times a day (TID) | ORAL | Status: DC | PRN

## 2023-09-02 MED ORDER — METHOCARBAMOL 500 MG PO TABS: 500.0000 mg | ORAL_TABLET | Freq: Three times a day (TID) | ORAL | Status: DC | PRN

## 2023-09-02 MED ORDER — SODIUM CHLORIDE 0.9 % IV SOLN: 250.0000 mL | INTRAVENOUS | Status: AC | PRN

## 2023-09-02 NOTE — Progress Notes (Signed)
 Heart Failure Navigator Progress Note  Assessed for Heart & Vascular TOC clinic readiness.  Patient does not meet criteria due to current [facility] patient and ESRD on Hemodialysis.   Navigator will sign off at this time.  Charmaine Pines, RN, BSN [facility] Heart Failure Navigator Secure Chat Only

## 2023-09-02 NOTE — ED Notes (Signed)
 This tech assisted pt. to the restroom at this time.

## 2023-09-02 NOTE — H&P (Addendum)
 History and Physical    Casey French FMW:[id number] DOB: [birth year] DOA: 09/02/2023  PCP: Valora Agent, MD (Confirm with patient/family/NH records and if not entered, this has to be entered at [facility] point of entry) Patient coming from: Home  I have personally briefly reviewed patient's old medical records in [facility] Health Link  Chief Complaint: SOB  HPI: Casey French is a 73 y.o. male with medical history significant of ESRD on PD, PAF on Eliquis , CAD, HTN, chronic HFpEF, HLD, IIDM, gout, urinary bladder cancer s/p chemotherapy presented with sudden onset of shortness of breath.  Symptoms started last night, patient was a few hours into sleep and PD, woke up with shortness of breath and tightness in the chest  cannot take deep breath, no cough no chest pains.  Denies any fever or chills.  He otherwise has been in his usual status of health.  EMS arrived and found patient hypertensive  ED Course: Afebrile, blood pressure 190/100, chest x-ray showed pulmonary congestion, blood work showed hyperkalemia K5.5 bicarb 18 BUN 64 creatinine 7.5, EKG showed tented T waves  Patient was given hyperkalemia cocktail including calcium  gluconate, insulin  and D50 and 1 DuoNeb treatment  Review of Systems: As per HPI otherwise 14 point review of systems negative.    Past Medical History:  Diagnosis Date   Acute diverticulitis 06/14/2014   Acute ST elevation myocardial infarction (STEMI) of inferior wall (HCC) 2003   a.) PCI with Cypher stent to the RCA   Anemia of chronic renal failure    Aortic atherosclerosis (HCC)    Bladder tumor    CAP (community acquired pneumonia) 06/2022   Cerebral microvascular disease    Coronary artery disease involving native coronary artery of native heart without angina pectoris 2003   a.) PCI with Cypher stent to the RCA 2003; b.) LHC 08/2003: 100% dRCA, 60% pLAD - med mgmt; c.) LHC/PCI 08/21/2015: occluded RCA at site of prior stent placement, 70% mLAD  (FFR 0.79; 3.5 x 12 mm Xience DES), 50% dLAD (FFR 0.70); d.) MV 11/11/2016: no ischemia   Depression    Diverticulosis    End-stage renal disease on peritoneal dialysis (HCC)    GERD (gastroesophageal reflux disease)    GI bleed 05/15/2019   Gout    Heart murmur    Hematemesis 05/15/2019   Hepatic cyst    Hepatomegaly    Hyperlipidemia    Hypertension    Lacunar infarction (HCC) 02/11/2007   a.) CT head 02/11/2007: lacunar infarction involving thalamus and external capsule region on the RIGHT; b.) MRI brain 02/03/2020: multiple remote lacunar infarcts involving the RIGHT basal ganglia, RIGHT thalamus, and LEFT middle cerebellar peduncle   Long-term use of aspirin  therapy    Myocardial bridge    On apixaban  therapy    Pancolitis (HCC) 09/2022   Paroxysmal atrial fibrillation (HCC)    a.) CHA2DS2VASc = 6 (age, HTN, CVA x 2, prior MI/vascular disease, T2DM) as of 03/11/2023; b.) rate/rhythm maintained on oral metoprolol  succinate; chronically anticoagulated with dose reduced apixaban    Renal cyst, right    Sigmoid diverticulitis 03/2022   T2DM (type 2 diabetes mellitus) (HCC)    Traumatic skull fracture and resulting subarachnoid hemorrhage 03/24/2013   a.) MOI was slipping and falling on ice   Unstable angina [facility])     Past Surgical History:  Procedure Laterality Date   BLADDER INSTILLATION N/A 04/05/2023   Procedure: BLADDER INSTILLATION OF GEMCITABINE ;  Surgeon: Twylla Glendia BROCKS, MD;  Location: [facility] ORS;  Service:  Urology;  Laterality: N/A;   CAPD INSERTION N/A 08/13/2020   Procedure: LAPAROSCOPIC INSERTION CONTINUOUS AMBULATORY PERITONEAL DIALYSIS  (CAPD) CATHETER;  Surgeon: Lane Shope, MD;  Location: [facility] ORS;  Service: General;  Laterality: N/A;   CARDIAC CATHETERIZATION     CATARACT EXTRACTION W/PHACO Right 07/05/2019   Procedure: CATARACT EXTRACTION PHACO AND INTRAOCULAR LENS PLACEMENT (IOC) RIGHT DIABETIC;  Surgeon: Jaye Fallow, MD;  Location: [facility] ORS;   Service: Ophthalmology;  Laterality: Right;  US00:59.3 CDE8.41 LOT [id number] h   COLONOSCOPY WITH PROPOFOL  N/A 04/01/2017   Procedure: COLONOSCOPY WITH PROPOFOL ;  Surgeon: Gaylyn Gladis PENNER, MD;  Location: [facility] ENDOSCOPY;  Service: Endoscopy;  Laterality: N/A;   CORONARY ANGIOPLASTY WITH STENT PLACEMENT  10/2001   RCA   CORONARY ANGIOPLASTY WITH STENT PLACEMENT Left 08/21/2015   Procedure: CORONARY ANGIOPLASTY WITH STENT PLACEMENT (mid LAD); Location: [facility]; Surgeon: Zachary Car, MD   ESOPHAGOGASTRODUODENOSCOPY N/A 04/24/2023   Procedure: EGD (ESOPHAGOGASTRODUODENOSCOPY);  Surgeon: Toledo, Ladell POUR, MD;  Location: [facility] ENDOSCOPY;  Service: Gastroenterology;  Laterality: N/A;   HAND SURGERY  2015   3 screws in right hand   TOTAL HIP ARTHROPLASTY Left 04/22/2020   Procedure: TOTAL HIP ARTHROPLASTY ANTERIOR APPROACH;  Surgeon: Kathlynn Sharper, MD;  Location: [facility] ORS;  Service: Orthopedics;  Laterality: Left;   TRANSURETHRAL RESECTION OF BLADDER TUMOR N/A 04/05/2023   Procedure: TURBT (TRANSURETHRAL RESECTION OF BLADDER TUMOR);  Surgeon: Twylla Glendia BROCKS, MD;  Location: [facility] ORS;  Service: Urology;  Laterality: N/A;     reports that he quit smoking about 22 years ago. His smoking use included cigarettes. He has never used smokeless tobacco. He reports that he does not drink alcohol and does not use drugs.  Allergies  Allergen Reactions   Morphine  Anxiety    agitation     Family History  Problem Relation Age of Onset   Heart disease Mother    Heart disease Father    CAD Other      Prior to Admission medications   Medication Sig Start Date End Date Taking? Authorizing Provider  acetaminophen  (TYLENOL ) 325 MG tablet Take 2 tablets (650 mg total) by mouth every 6 (six) hours as needed for mild pain (or Fever >/= 101). 10/11/22  Yes Jens Durand, MD  apixaban  (ELIQUIS ) 2.5 MG TABS tablet Take 2.5 mg by mouth 2 (two) times daily.   Yes [provider]  aspirin  81 MG EC tablet  Take 81 mg by mouth daily.   Yes [provider]  calcitRIOL  (ROCALTROL ) 0.25 MCG capsule Take 1 capsule (0.25 mcg total) by mouth daily. 04/25/22  Yes Dorinda Drue DASEN, MD  gabapentin  (NEURONTIN ) 300 MG capsule Take 300 mg by mouth at bedtime. 09/25/16  Yes [provider]  HYDROcodone -acetaminophen  (NORCO/VICODIN) 5-325 MG tablet Take 1 tablet by mouth every 6 (six) hours as needed for moderate pain (pain score 4-6). 04/05/23  Yes Stoioff, Glendia BROCKS, MD  hydrocortisone  (ANUSOL -HC) 2.5 % rectal cream Place 1 application rectally 2 (two) times daily as needed for hemorrhoids or anal itching.   Yes [provider]  losartan  (COZAAR ) 100 MG tablet Take 0.5 tablets (50 mg total) by mouth daily. 04/24/23  Yes Darci Pore, MD  meclizine  (ANTIVERT ) 25 MG tablet Take 1 tablet (25 mg total) by mouth 2 (two) times daily. 04/24/22  Yes Dorinda Drue DASEN, MD  methocarbamol  (ROBAXIN ) 500 MG tablet Take 500 mg by mouth 3 (three) times daily as needed. 11/13/20  Yes [provider]  metoprolol  succinate (TOPROL -XL) 100 MG 24  hr tablet Take 0.5 tablets (50 mg total) by mouth every morning. Take with or immediately following a meal. 04/24/23  Yes Sreeram, Concepcion, MD  nitroGLYCERIN  (NITROSTAT ) 0.4 MG SL tablet Place 0.4 mg under the tongue every 5 (five) minutes as needed for chest pain.   Yes [provider]  ondansetron  (ZOFRAN ) 4 MG tablet Take 4 mg by mouth every 8 (eight) hours as needed for nausea or vomiting. 04/22/23  Yes [provider]  pantoprazole  (PROTONIX ) 40 MG tablet Take 1 tablet (40 mg total) by mouth daily. 04/24/23 04/23/24 Yes Darci Concepcion, MD  psyllium (METAMUCIL) 58.6 % packet Take 1 packet by mouth 2 (two) times daily.   Yes [provider]  rosuvastatin  (CRESTOR ) 40 MG tablet Take 40 mg by mouth every morning. 03/06/19  Yes [provider]  folic acid  (FOLVITE ) 1 MG tablet Take 1 tablet (1 mg total) by mouth daily.  Take 1 tablet daily Patient not taking: Reported on 09/02/2023 04/24/23   Darci Concepcion, MD  ondansetron  (ZOFRAN -ODT) 4 MG disintegrating tablet Take 1 tablet (4 mg total) by mouth every 8 (eight) hours as needed. 03/15/23   Claudene Rover, MD  Vitamin D, Ergocalciferol, (DRISDOL) 1.25 MG (50000 UNIT) CAPS capsule Take 50,000 Units by mouth once a week. Patient not taking: Reported on 09/02/2023 10/23/20   [provider]    Physical Exam: Vitals:   09/02/23 1039  BP: (!) 190/102  Pulse: 89  Resp: (!) 26  Temp: 98.1 F (36.7 C)  TempSrc: Oral  SpO2: 98%    Constitutional: NAD, calm, comfortable Vitals:   09/02/23 1039  BP: (!) 190/102  Pulse: 89  Resp: (!) 26  Temp: 98.1 F (36.7 C)  TempSrc: Oral  SpO2: 98%   Eyes: PERRL, lids and conjunctivae normal ENMT: Mucous membranes are moist. Posterior pharynx clear of any exudate or lesions.Normal dentition.  Neck: normal, supple, no masses, no thyromegaly Respiratory: clear to auscultation bilaterally, no wheezing, fine crackles on bilateral lower fields, increasing respiratory effort. No accessory muscle use.  Cardiovascular: Regular rate and rhythm, no murmurs / rubs / gallops. No extremity edema. 2+ pedal pulses. No carotid bruits.  Abdomen: no tenderness, no masses palpated. No hepatosplenomegaly. Bowel sounds positive.  Musculoskeletal: no clubbing / cyanosis. No joint deformity upper and lower extremities. Good ROM, no contractures. Normal muscle tone.  Skin: no rashes, lesions, ulcers. No induration Neurologic: CN 2-12 grossly intact. Sensation intact, DTR normal. Strength 5/5 in all 4.  Psychiatric: Normal judgment and insight. Alert and oriented x 3. Normal mood.     Labs on Admission: I have personally reviewed following labs and imaging studies  CBC: Recent Labs  Lab 09/02/23 1050  WBC 7.3  HGB 8.5*  HCT 28.1*  MCV 102.2*  PLT 245   Basic Metabolic Panel: Recent Labs  Lab 09/02/23 1050  NA 143   K 5.5*  CL 114*  CO2 18*  GLUCOSE 81  BUN 64*  CREATININE 7.59*  CALCIUM  7.6*   GFR: CrCl cannot be calculated (Unknown ideal weight.). Liver Function Tests: No results for input(s): AST, ALT, ALKPHOS, BILITOT, PROT, ALBUMIN in the last 168 hours. No results for input(s): LIPASE, AMYLASE in the last 168 hours. No results for input(s): AMMONIA in the last 168 hours. Coagulation Profile: No results for input(s): INR, PROTIME in the last 168 hours. Cardiac Enzymes: No results for input(s): CKTOTAL, CKMB, CKMBINDEX, TROPONINI in the last 168 hours. BNP (last 3 results) No results for input(s): PROBNP in  the last 8760 hours. HbA1C: No results for input(s): HGBA1C in the last 72 hours. CBG: Recent Labs  Lab 09/02/23 1522  GLUCAP 97   Lipid Profile: No results for input(s): CHOL, HDL, LDLCALC, TRIG, CHOLHDL, LDLDIRECT in the last 72 hours. Thyroid  Function Tests: No results for input(s): TSH, T4TOTAL, FREET4, T3FREE, THYROIDAB in the last 72 hours. Anemia Panel: No results for input(s): VITAMINB12, FOLATE, FERRITIN, TIBC, IRON , RETICCTPCT in the last 72 hours. Urine analysis:    Component Value Date/Time   COLORURINE YELLOW (A) 04/22/2023 1952   APPEARANCEUR Clear 08/16/2023 1411   LABSPEC 1.020 04/22/2023 1952   PHURINE 7.0 04/22/2023 1952   GLUCOSEU 1+ (A) 08/16/2023 1411   HGBUR MODERATE (A) 04/22/2023 1952   BILIRUBINUR Negative 08/16/2023 1411   KETONESUR NEGATIVE 04/22/2023 1952   PROTEINUR 3+ (A) 08/16/2023 1411   PROTEINUR >=300 (A) 04/22/2023 1952   NITRITE Negative 08/16/2023 1411   NITRITE NEGATIVE 04/22/2023 1952   LEUKOCYTESUR Negative 08/16/2023 1411   LEUKOCYTESUR NEGATIVE 04/22/2023 1952    Radiological Exams on Admission: DG Chest 2 View Result Date: 09/02/2023 EXAM: 2 VIEW(S) XRAY OF THE CHEST 09/02/2023 11:01:00 AM COMPARISON: 04/22/2023 CLINICAL HISTORY: sob. woke up with SOB. Pt  reports some chest tightness. Pt hypertensive in triage FINDINGS: LUNGS AND PLEURA: Some increasing coarse opacities at the right lung base, with new blunting of the right lateral Costophrenic Angle, suggesting a small effusion. HEART AND MEDIASTINUM: Coronary and aortic calcification. BONES AND SOFT TISSUES: No acute osseous abnormality. IMPRESSION: 1. Increasing coarse opacities at the right lung base with new blunting of the right lateral costophrenic angle, suggesting a small effusion. Electronically signed by: Dayne Hassell MD 09/02/2023 11:28 AM EDT RP Workstation: [id number]    EKG: Independently reviewed.  Sinus rhythm, tented T waves on multiple leads  Assessment/Plan Principal Problem:   CHF (congestive heart failure) (HCC) Active Problems:   Acute respiratory failure with hypoxia (HCC)   ESRD on peritoneal dialysis (HCC)   Hypertensive urgency, malignant   Hyperkalemia  (please populate well all problems here in Problem List. (For example, if patient is on BP meds at home and you resume or decide to hold them, it is a problem that needs to be her. Same for CAD, COPD, HLD and so on)  Acute on chronic HFpEF acute decompensation Fluid overload ESRD on PD - Resume peritoneal dialysis ASAP, nephrology consulted - Other DDx, chest tenderness likely from fluid overload and CHF decompensation, troponin negative x 2 and EKG no ischemic ST changes.  ACS ruled out. - Check echocardiogram - 1 dose of IV Lasix  80 mg given  Hyperkalemia - With acute T wave changes on EKG - Hyperkalemia cocktail given in the ED including calcium  gluconate, insulin  and D50 - Nephrology consulted for setting up PD tonight.  HTN, uncontrolled - Add as needed labetalol  - Hold off ARB due to hyperkalemia - Resume metoprolol   PAF on Eliquis  - No acute concern - Continue metoprolol  - Continue Eliquis   Anxiety/depression - Mentation at baseline, continue Lexapro   Total time spent on patient care 55  minutes  DVT prophylaxis: Heparin  subcu Code Status: Full code Family Communication: Wife at bedside Disposition Plan: Expect less than 2 midnight hospital stay Consults called: Nephrology Admission status: Telemetry observation   Cort ONEIDA Mana MD [facility] Pager [phone]  09/02/2023, 3:31 PM

## 2023-09-02 NOTE — ED Provider Notes (Signed)
 [facility] Provider Note    Event Date/Time   First MD Initiated Contact with Patient 09/02/23 1206     (approximate)   History   Shortness of Breath  Pt to ED via POV from home. Pt reports was doing his PD during the night and woke up with SOB. Pt reports some chest tightness. Pt hypertensive in triage   HPI Casey French is a 73 y.o. male PMH multiple medical comorbidities including ESRD on peritoneal dialysis, hyperlipidemia, DM2, CAD with DES to LAD, A-fib on Eliquis , hypertension, prior GI bleed presents for evaluation of shortness of breath and chest discomfort - Patient was doing his peritoneal dialysis last night/early this morning but stopped in early because he was feeling very short of breath.  No significant cough or fever.  Denies any frank chest pain on my eval, no abdominal pain. - Has otherwise been in his usual state of health - He is taking his Eliquis  as prescribed         Physical Exam   Triage Vital Signs: ED Triage Vitals  Encounter Vitals Group     BP 09/02/23 1039 (!) 190/102     Girls Systolic BP Percentile --      Girls Diastolic BP Percentile --      Boys Systolic BP Percentile --      Boys Diastolic BP Percentile --      Pulse Rate 09/02/23 1039 89     Resp 09/02/23 1039 (!) 26     Temp 09/02/23 1039 98.1 F (36.7 C)     Temp Source 09/02/23 1039 Oral     SpO2 09/02/23 1039 98 %     Weight --      Height --      Head Circumference --      Peak Flow --      Pain Score 09/02/23 1040 6     Pain Loc --      Pain Education --      Exclude from Growth Chart --     Most recent vital signs: Vitals:   09/02/23 1039  BP: (!) 190/102  Pulse: 89  Resp: (!) 26  Temp: 98.1 F (36.7 C)  SpO2: 98%     General: Awake, mild tachypnea CV:  Good peripheral perfusion. RRR, RP 2+ Resp:  Mild tachypnea.  CTAB but coarse breath sounds noted in bilateral lower lung field Abd:  Mildly distended. Nontender to deep  palpation throughout Other:     ED Results / Procedures / Treatments   Labs (all labs ordered are listed, but only abnormal results are displayed) Labs Reviewed  BASIC METABOLIC PANEL WITH GFR - Abnormal; Notable for the following components:      Result Value   Potassium 5.5 (*)    Chloride 114 (*)    CO2 18 (*)    BUN 64 (*)    Creatinine, Ser 7.59 (*)    Calcium  7.6 (*)    GFR, Estimated 7 (*)    All other components within normal limits  CBC - Abnormal; Notable for the following components:   RBC 2.75 (*)    Hemoglobin 8.5 (*)    HCT 28.1 (*)    MCV 102.2 (*)    All other components within normal limits  HEPATITIS B SURFACE ANTIGEN  CBG MONITORING, ED  TROPONIN I (HIGH SENSITIVITY)  TROPONIN I (HIGH SENSITIVITY)     EKG  See ED course below   RADIOLOGY Radiology interpreted  by myself and radiology report reviewed.  Right pleural effusion noted.    PROCEDURES:  Critical Care performed: Yes  .Critical Care  Performed by: Clarine Ozell LABOR, MD Authorized by: Clarine Ozell LABOR, MD   Critical care provider statement:    Critical care time (minutes):  30   Critical care time was exclusive of:  Separately billable procedures and treating other patients   Critical care was necessary to treat or prevent imminent or life-threatening deterioration of the following conditions:  Metabolic crisis (hyperkalemia w/ ECG changes)   Critical care was time spent personally by me on the following activities:  Development of treatment plan with patient or surrogate, discussions with consultants, evaluation of patient's response to treatment, examination of patient, ordering and review of laboratory studies, ordering and review of radiographic studies, ordering and performing treatments and interventions, pulse oximetry, re-evaluation of patient's condition and review of old charts   I assumed direction of critical care for this patient from another provider in my specialty: no      Care discussed with: admitting provider      MEDICATIONS ORDERED IN ED: Medications  aspirin  EC tablet 81 mg (has no administration in time range)  HYDROcodone -acetaminophen  (NORCO/VICODIN) 5-325 MG per tablet 1 tablet (has no administration in time range)  metoprolol  succinate (TOPROL -XL) 24 hr tablet 50 mg (has no administration in time range)  rosuvastatin  (CRESTOR ) tablet 40 mg (has no administration in time range)  calcitRIOL  (ROCALTROL ) capsule 0.25 mcg (has no administration in time range)  meclizine  (ANTIVERT ) tablet 25 mg (has no administration in time range)  ondansetron  (ZOFRAN ) tablet 4 mg (has no administration in time range)  pantoprazole  (PROTONIX ) EC tablet 40 mg (has no administration in time range)  apixaban  (ELIQUIS ) tablet 2.5 mg (has no administration in time range)  gabapentin  (NEURONTIN ) capsule 300 mg (has no administration in time range)  methocarbamol  (ROBAXIN ) tablet 500 mg (has no administration in time range)  sodium chloride  flush (NS) 0.9 % injection 3 mL (3 mLs Intravenous Given 09/02/23 1509)  sodium chloride  flush (NS) 0.9 % injection 3 mL (has no administration in time range)  0.9 %  sodium chloride  infusion (has no administration in time range)  acetaminophen  (TYLENOL ) tablet 650 mg (has no administration in time range)  ondansetron  (ZOFRAN ) injection 4 mg (4 mg Intravenous Given 09/02/23 1508)  gentamicin  cream (GARAMYCIN ) 0.1 % 1 Application (0 Applications Topical Hold 09/02/23 1558)  dialysis solution 2.5% low-MG/low-CA dianeal  solution (0 mLs Intraperitoneal Hold 09/02/23 1558)  labetalol  (NORMODYNE ) injection 10 mg (has no administration in time range)  albuterol  (PROVENTIL ) (2.5 MG/3ML) 0.083% nebulizer solution 10 mg (10 mg Nebulization Given 09/02/23 1320)  calcium  gluconate inj 10% (1 g) URGENT USE ONLY! (1 g Intravenous Given 09/02/23 1357)  insulin  aspart (novoLOG ) injection 5 Units (5 Units Intravenous Given 09/02/23 1523)    And  dextrose  50 % solution  50 mL (50 mLs Intravenous Given 09/02/23 1523)  patiromer  (VELTASSA ) packet 16.8 g (16.8 g Oral Given 09/02/23 1524)  furosemide  (LASIX ) injection 80 mg (80 mg Intravenous Given 09/02/23 1508)     IMPRESSION / MDM / ASSESSMENT AND PLAN / ED COURSE  I reviewed the triage vital signs and the nursing notes.                              DDX/MDM/AP: Differential diagnosis includes, but is not limited to, hypervolemia/pulmonary edema or pleural effusion, consider pneumonia, considered but  doubt ACS do not clinically suspect PE and is already anticoagulated patient, consider symptomatic anemia.  Plan: - Labs  -EKG - Cardiac monitor - Chest x-ray - Reassess  Patient's presentation is most consistent with acute presentation with potential threat to life or bodily function.  The patient is on the cardiac monitor to evaluate for evidence of arrhythmia and/or significant heart rate changes.  ED course below.  Workup notable for right-sided pleural effusion, suspect this is etiology of patient's shortness of breath.  Did also have hyperkalemia to 5.5 with apparent peaking of T waves compared to prior EKGs, somewhat atypical with low level of hyperkalemia though present on the less.  Given this, to treat hyperkalemia aggressively and discussed with nephrology.  Admitted to hospitalist service for ongoing treatment of hyperkalemia, plan for more aggressive dialysis session tonight to help with fluid collection in lungs, can consider thoracentesis as needed, no indication for emergent thoracentesis at this time and patient is currently anticoagulated.  Clinical Course as of 09/02/23 1625  Fri Sep 02, 2023  1220 9M ESRD on PD On second bag of PD last night felt short of breath No cough, fever  [MM]  1221 CXR: IMPRESSION: 1. Increasing coarse opacities at the right lung base with new blunting of the right lateral costophrenic angle, suggesting a small effusion.   [MM]  1221 Trop wnl [MM]  1221 Cbc  at baseline  [MM]  1221 Cmp w/ mild hyperkalemia, otherwise at baseline [MM]  1248 Ecg = sinus rhythm, rate 94, no gross ST elevation or depression, does appear to have some peaked T waves compared to prior EKGs. [MM]  1252 Hyperkalemia medications ordered  Nephrology paged to discuss [MM]  1256 D/w Dr. Dennise of nephro - obs - dialysis tonight - can consider thora next wk prn [MM]  1306 Hospitalist consult order placed [MM]    Clinical Course User Index [MM] Clarine Ozell LABOR, MD     FINAL CLINICAL IMPRESSION(S) / ED DIAGNOSES   Final diagnoses:  Shortness of breath  Pleural effusion  Hyperkalemia  ESRD (end stage renal disease) (HCC)     Rx / DC Orders   ED Discharge Orders     None        Note:  This document was prepared using Dragon voice recognition software and may include unintentional dictation errors.   Clarine Ozell LABOR, MD 09/02/23 [phone]

## 2023-09-02 NOTE — ED Triage Notes (Signed)
 Pt to ED via POV from home. Pt reports was doing his PD during the night and woke up with SOB. Pt reports some chest tightness. Pt hypertensive in triage

## 2023-09-03 DIAGNOSIS — I5032 Chronic diastolic (congestive) heart failure: Secondary | ICD-10-CM | POA: Diagnosis not present

## 2023-09-03 DIAGNOSIS — E875 Hyperkalemia: Secondary | ICD-10-CM

## 2023-09-03 DIAGNOSIS — I160 Hypertensive urgency: Secondary | ICD-10-CM

## 2023-09-03 DIAGNOSIS — E8721 Acute metabolic acidosis: Secondary | ICD-10-CM | POA: Diagnosis not present

## 2023-09-03 DIAGNOSIS — N186 End stage renal disease: Secondary | ICD-10-CM | POA: Diagnosis not present

## 2023-09-03 DIAGNOSIS — J9600 Acute respiratory failure, unspecified whether with hypoxia or hypercapnia: Secondary | ICD-10-CM | POA: Diagnosis not present

## 2023-09-03 DIAGNOSIS — I5033 Acute on chronic diastolic (congestive) heart failure: Secondary | ICD-10-CM | POA: Diagnosis not present

## 2023-09-03 DIAGNOSIS — D631 Anemia in chronic kidney disease: Secondary | ICD-10-CM | POA: Diagnosis not present

## 2023-09-03 LAB — BASIC METABOLIC PANEL WITH GFR
Anion gap: 9 (ref 5–15)
BUN: 65 mg/dL — ABNORMAL HIGH (ref 8–23)
CO2: 20 mmol/L — ABNORMAL LOW (ref 22–32)
Calcium: 7.6 mg/dL — ABNORMAL LOW (ref 8.9–10.3)
Chloride: 111 mmol/L (ref 98–111)
Creatinine, Ser: 7.16 mg/dL — ABNORMAL HIGH (ref 0.61–1.24)
GFR, Estimated: 8 mL/min — ABNORMAL LOW (ref >60)
Glucose, Bld: 113 mg/dL — ABNORMAL HIGH (ref 70–99)
Potassium: 4.4 mmol/L (ref 3.5–5.1)
Sodium: 140 mmol/L (ref 135–145)

## 2023-09-03 LAB — HEPATITIS B SURFACE ANTIGEN: Hepatitis B Surface Ag: NONREACTIVE

## 2023-09-03 MED ORDER — SODIUM BICARBONATE 650 MG PO TABS
1300.0000 mg | ORAL_TABLET | Freq: Two times a day (BID) | ORAL | Status: DC
  Administered 2023-09-03: 1300 mg via ORAL
  Filled 2023-09-03: qty 2

## 2023-09-03 MED ORDER — SODIUM BICARBONATE 650 MG PO TABS
1300.0000 mg | ORAL_TABLET | Freq: Two times a day (BID) | ORAL | 1 refills | Status: DC
Start: 2023-09-03 — End: 2023-09-22

## 2023-09-03 NOTE — Progress Notes (Addendum)
 [facility]  ROUNDING NOTE   Subjective:   Casey French is a 73 y.o. male with past medical conditions including hypertension, hyperlipidemia, PAD, gout, type 2 diabetes, paroxysmal atrial fibrillation on Eliquis , and end-stage renal disease on peritoneal dialysis.  Patient presents to ED with shortness of breath after PD treatment and has been admitted for Shortness of breath [R06.02] Hyperkalemia [E87.5] CHF (congestive heart failure) (HCC) [I50.9] Pleural effusion [J90] ESRD (end stage renal disease) (HCC) [N18.6]  Patient is known to our practice and is followed out of [facility] by Dr. Douglas.  States he had completed his treatment overnight and awoke with shortness of breath. Denies recently missed treatments. States he has positive Uf's outpatient. Denies recent lower extremity swelling.   Labs on ED arrival include potassium 5.5, S bicarb 18, BUN 64, creatinine 7.59 with GFR 7, Hgb 8.5.  Chest x-ray shows coarse opacities in right lung base.  We have been consulted to manage dialysis needs.  Objective:  Vital signs in last 24 hours:  Temp:  [97.9 F (36.6 C)-98.5 F (36.9 C)] 98.2 F (36.8 C) (08/09 0841) Pulse Rate:  [84-115] 89 (08/09 1016) Resp:  [17-32] 17 (08/09 0841) BP: (131-193)/(61-100) 147/70 (08/09 1016) SpO2:  [96 %-100 %] 97 % (08/09 0841) Weight:  [61.8 kg] 61.8 kg (08/09 0500)  Weight change:  Filed Weights   09/03/23 0500  Weight: 61.8 kg    Intake/Output: No intake/output data recorded.   Intake/Output this shift:  Total I/O In: 493 [I.V.:10; Other:483] Out: -   Physical Exam: General: NAD  Head: Normocephalic, atraumatic. Moist oral mucosal membranes  Eyes: Anicteric  Neck: Supple  Lungs:  Clear to auscultation,normal effort  Heart: Regular rate and rhythm  Abdomen:  Soft, nontender PDC  Extremities:  No peripheral edema.  Neurologic: Awake, alert, conversant  Skin: Warm,dry, no rash  Access: PD catheter     Basic Metabolic Panel: Recent Labs  Lab 09/02/23 1050 09/03/23 0154  NA 143 140  K 5.5* 4.4  CL 114* 111  CO2 18* 20*  GLUCOSE 81 113*  BUN 64* 65*  CREATININE 7.59* 7.16*  CALCIUM  7.6* 7.6*    Liver Function Tests: No results for input(s): AST, ALT, ALKPHOS, BILITOT, PROT, ALBUMIN in the last 168 hours. No results for input(s): LIPASE, AMYLASE in the last 168 hours. No results for input(s): AMMONIA in the last 168 hours.  CBC: Recent Labs  Lab 09/02/23 1050  WBC 7.3  HGB 8.5*  HCT 28.1*  MCV 102.2*  PLT 245    Cardiac Enzymes: No results for input(s): CKTOTAL, CKMB, CKMBINDEX, TROPONINI in the last 168 hours.  BNP: Invalid input(s): POCBNP  CBG: Recent Labs  Lab 09/02/23 1522  GLUCAP 97    Microbiology: Results for orders placed or performed in visit on 08/16/23  Microscopic Examination     Status: Abnormal   Collection Time: 08/16/23  2:11 PM   Urine  Result Value Ref Range Status   WBC, UA 11-30 (A) 0 - 5 /hpf Final   RBC, Urine 3-10 (A) 0 - 2 /hpf Final   Epithelial Cells (non renal) 0-10 0 - 10 /hpf Final   Bacteria, UA Few None seen/Few Final    Coagulation Studies: No results for input(s): LABPROT, INR in the last 72 hours.  Urinalysis: No results for input(s): COLORURINE, LABSPEC, PHURINE, GLUCOSEU, HGBUR, BILIRUBINUR, KETONESUR, PROTEINUR, UROBILINOGEN, NITRITE, LEUKOCYTESUR in the last 72 hours.  Invalid input(s): APPERANCEUR    Imaging: DG Chest 2  View Result Date: 09/02/2023 EXAM: 2 VIEW(S) XRAY OF THE CHEST 09/02/2023 11:01:00 AM COMPARISON: 04/22/2023 CLINICAL HISTORY: sob. woke up with SOB. Pt reports some chest tightness. Pt hypertensive in triage FINDINGS: LUNGS AND PLEURA: Some increasing coarse opacities at the right lung base, with new blunting of the right lateral Costophrenic Angle, suggesting a small effusion. HEART AND MEDIASTINUM: Coronary and aortic calcification.  BONES AND SOFT TISSUES: No acute osseous abnormality. IMPRESSION: 1. Increasing coarse opacities at the right lung base with new blunting of the right lateral costophrenic angle, suggesting a small effusion. Electronically signed by: Dayne Hassell MD 09/02/2023 11:28 AM EDT RP Workstation: [id number]     Medications:    sodium chloride      dialysis solution 2.5% low-MG/low-CA Stopped (09/02/23 1558)    apixaban   2.5 mg Oral BID   aspirin  EC  81 mg Oral Daily   calcitRIOL   0.25 mcg Oral Daily   gabapentin   300 mg Oral QHS   gentamicin  cream  1 Application Topical Daily   meclizine   25 mg Oral BID   metoprolol  succinate  50 mg Oral [id number]   pantoprazole   40 mg Oral Daily   rosuvastatin   40 mg Oral Daily   sodium bicarbonate   1,300 mg Oral BID   sodium chloride  flush  3 mL Intravenous Q12H   sodium chloride , acetaminophen , HYDROcodone -acetaminophen , labetalol , methocarbamol , ondansetron  (ZOFRAN ) IV, ondansetron , sodium chloride  flush  Assessment/ Plan:  Casey French is a 73 y.o.  male  with past medical conditions including hypertension, hyperlipidemia, PAD, gout, type 2 diabetes, paroxysmal atrial fibrillation on Eliquis , and end-stage renal disease on peritoneal dialysis.    Acute respiratory failure, complaints of shortness of breath on ED arrival. Chest xray shows right lung base opacities. Has remained on room air. Primary team ordered IV Furosemide  80mg  once. Respiratory status has improved since admission.   2. End stage renal disease on peritoneal dialysis. Reports no missed treatments. Completed treatment last night with 2.5% dialysate. UF -483. Will continue nightly treatment while inpatient.   3. Acute metabolic acidosis, S bicarb 18 on admission. This can also cause shortness of breath. Will order oral sodium bicarbonate  1300mg  twice daily.   4. Anemia of chronic kidney disease Lab Results  Component Value Date   HGB 8.5 (L) 09/02/2023    Hgb decreased.  Patient receives Mircera at outpatient clinic.   5. Secondary Hyperparathyroidism: with outpatient labs: PTH 1494, phosphorus 7.1, calcium  6.8 on 08/31/23.   Lab Results  Component Value Date   CALCIUM  7.6 (L) 09/03/2023   CAION 0.95 (L) 04/05/2023   PHOS 5.5 (H) 04/24/2023    Will continue to monitor bone minerals during this admission.     LOS: 0 Jaquell Seddon 8/9/202511:03 AM

## 2023-09-03 NOTE — Hospital Course (Addendum)
 Taken from H&P.   Casey French is a 73 y.o. male with medical history significant of ESRD on PD, PAF on Eliquis , CAD, HTN, chronic HFpEF, HLD, IIDM, gout, urinary bladder cancer s/p chemotherapy presented with sudden onset of shortness of breath.  No chest pain.  On presentation patient had elevated blood pressure at 190/100, chest x-ray concerning for pulmonary congestion, labs with hyperkalemia at 5.5, bicarb 18, BUN 64, creatinine 7.5 EKG shows tented T waves.  Patient received hyperkalemia protocol medical management which include calcium  gluconate, insulin  and breathing treatment, along with Veltassa  and Lasix .  Nephrology was consulted and echocardiogram ordered.  8/9: Mildly elevated blood pressure at 161/88, hyperkalemia resolved with potassium at 4.4 today, bicarb of 20-he was started on p.o. bicarb supplement after discussing with nephrology.  Patient clinically appears euvolemic and at baseline.  After discussing with nephrology he is being discharged on his prior home medications with a close outpatient follow-up.  If potassium remains the issue then they might have to change his home losartan .  He will continue with his routine peritoneal dialysis and current medications and need to have a close follow-up with his providers for further assistance.

## 2023-09-03 NOTE — Plan of Care (Signed)

## 2023-09-03 NOTE — Plan of Care (Signed)
  Problem: Education: Goal: Knowledge of General Education information will improve Description: Including pain rating scale, medication(s)/side effects and non-pharmacologic comfort measures Outcome: Progressing   Problem: Health Behavior/Discharge Planning: Goal: Ability to manage health-related needs will improve Outcome: Progressing   Problem: Clinical Measurements: Goal: Ability to maintain clinical measurements within normal limits will improve Outcome: Progressing Goal: Will remain free from infection Outcome: Progressing Goal: Diagnostic test results will improve Outcome: Progressing Goal: Respiratory complications will improve Outcome: Progressing Goal: Cardiovascular complication will be avoided Outcome: Progressing   Problem: Nutrition: Goal: Adequate nutrition will be maintained Outcome: Progressing   Problem: Elimination: Goal: Will not experience complications related to bowel motility Outcome: Progressing Goal: Will not experience complications related to urinary retention Outcome: Progressing   Problem: Pain Managment: Goal: General experience of comfort will improve and/or be controlled Outcome: Progressing   Problem: Safety: Goal: Ability to remain free from injury will improve Outcome: Progressing   Problem: Skin Integrity: Goal: Risk for impaired skin integrity will decrease Outcome: Progressing

## 2023-09-03 NOTE — Discharge Summary (Signed)
 Physician Discharge Summary   Patient: Casey French MRN: [id number] DOB: 05-Apr-1950  Admit date:     09/02/2023  Discharge date: 09/03/23  Discharge Physician: Amaryllis Dare   PCP: Valora Agent, MD   Recommendations at discharge:  Please obtain renal function with the next few days and if potassium seems higher then consider switching from losartan  Follow-up with nephrology Follow-up with primary care provider  Discharge Diagnoses: Principal Problem:   CHF (congestive heart failure) (HCC) Active Problems:   Acute respiratory failure with hypoxia (HCC)   ESRD (end stage renal disease) (HCC)   Hypertensive urgency, malignant   Hyperkalemia   Hospital Course: Taken from H&P.   Casey French is a 73 y.o. male with medical history significant of ESRD on PD, PAF on Eliquis , CAD, HTN, chronic HFpEF, HLD, IIDM, gout, urinary bladder cancer s/p chemotherapy presented with sudden onset of shortness of breath.  No chest pain.  On presentation patient had elevated blood pressure at 190/100, chest x-ray concerning for pulmonary congestion, labs with hyperkalemia at 5.5, bicarb 18, BUN 64, creatinine 7.5 EKG shows tented T waves.  Patient received hyperkalemia protocol medical management which include calcium  gluconate, insulin  and breathing treatment, along with Veltassa  and Lasix .  Nephrology was consulted and echocardiogram ordered.  8/9: Mildly elevated blood pressure at 161/88, hyperkalemia resolved with potassium at 4.4 today, bicarb of 20-he was started on p.o. bicarb supplement after discussing with nephrology.  Patient clinically appears euvolemic and at baseline.  After discussing with nephrology he is being discharged on his prior home medications with a close outpatient follow-up.  If potassium remains the issue then they might have to change his home losartan .  He will continue with his routine peritoneal dialysis and current medications and need to have a close  follow-up with his providers for further assistance.   Consultants: Nephrology Procedures performed: Peritoneal dialysis Disposition: Home Diet recommendation:  Discharge Diet Orders (From admission, onward)     Start     Ordered   09/03/23 0000  Diet - low sodium heart healthy        09/03/23 1200           Renal diet DISCHARGE MEDICATION: Allergies as of 09/03/2023       Reactions   Morphine  Anxiety   agitation        Medication List     TAKE these medications    acetaminophen  325 MG tablet Commonly known as: TYLENOL  Take 2 tablets (650 mg total) by mouth every 6 (six) hours as needed for mild pain (or Fever >/= 101).   apixaban  2.5 MG Tabs tablet Commonly known as: ELIQUIS  Take 2.5 mg by mouth 2 (two) times daily.   aspirin  EC 81 MG tablet Take 81 mg by mouth daily.   calcitRIOL  0.25 MCG capsule Commonly known as: ROCALTROL  Take 1 capsule (0.25 mcg total) by mouth daily.   folic acid  1 MG tablet Commonly known as: FOLVITE  Take 1 tablet (1 mg total) by mouth daily. Take 1 tablet daily   gabapentin  300 MG capsule Commonly known as: NEURONTIN  Take 300 mg by mouth at bedtime.   HYDROcodone -acetaminophen  5-325 MG tablet Commonly known as: NORCO/VICODIN Take 1 tablet by mouth every 6 (six) hours as needed for moderate pain (pain score 4-6).   hydrocortisone  2.5 % rectal cream Commonly known as: ANUSOL -HC Place 1 application rectally 2 (two) times daily as needed for hemorrhoids or anal itching.   losartan  100 MG tablet Commonly known as: COZAAR  Take  0.5 tablets (50 mg total) by mouth daily.   meclizine  25 MG tablet Commonly known as: ANTIVERT  Take 1 tablet (25 mg total) by mouth 2 (two) times daily.   methocarbamol  500 MG tablet Commonly known as: ROBAXIN  Take 500 mg by mouth 3 (three) times daily as needed.   metoprolol  succinate 100 MG 24 hr tablet Commonly known as: TOPROL -XL Take 0.5 tablets (50 mg total) by mouth every morning. Take with  or immediately following a meal.   nitroGLYCERIN  0.4 MG SL tablet Commonly known as: NITROSTAT  Place 0.4 mg under the tongue every 5 (five) minutes as needed for chest pain.   ondansetron  4 MG disintegrating tablet Commonly known as: ZOFRAN -ODT Take 1 tablet (4 mg total) by mouth every 8 (eight) hours as needed.   ondansetron  4 MG tablet Commonly known as: ZOFRAN  Take 4 mg by mouth every 8 (eight) hours as needed for nausea or vomiting.   pantoprazole  40 MG tablet Commonly known as: Protonix  Take 1 tablet (40 mg total) by mouth daily.   psyllium 58.6 % packet Commonly known as: METAMUCIL Take 1 packet by mouth 2 (two) times daily.   rosuvastatin  40 MG tablet Commonly known as: CRESTOR  Take 40 mg by mouth every morning.   sodium bicarbonate  650 MG tablet Take 2 tablets (1,300 mg total) by mouth 2 (two) times daily.   Vitamin D (Ergocalciferol) 1.25 MG (50000 UNIT) Caps capsule Commonly known as: DRISDOL Take 50,000 Units by mouth once a week.               Discharge Care Instructions  (From admission, onward)           Start     Ordered   09/03/23 0000  Discharge wound care:       Comments: Clean skin near exit site with chloraprep swab sticks.  Starting at catheter, use circular pattern around exit site, moving towards outer edges of area covered by dressing.  Apply gentamicin  cream to site once daily.  Cover with dry dressing.   09/03/23 1200            Follow-up Information     Valora Agent, MD. Schedule an appointment as soon as possible for a visit in 1 week(s).   Specialty: Family Medicine Contact information: [address] [place] [address] [phone]                Discharge Exam: Fredricka Weights   09/03/23 0500  Weight: 61.8 kg   General.  Frail and malnourished elderly man, in no acute distress. Pulmonary.  Lungs clear bilaterally, normal respiratory effort. CV.  Regular rate and rhythm, no JVD, rub or murmur. Abdomen.   Soft, nontender, nondistended, BS positive. CNS.  Alert and oriented .  No focal neurologic deficit. Extremities.  No edema, pulses intact and symmetrical. Psychiatry.  Judgment and insight appears normal.   Condition at discharge: stable  The results of significant diagnostics from this hospitalization (including imaging, microbiology, ancillary and laboratory) are listed below for reference.   Imaging Studies: DG Chest 2 View Result Date: 09/02/2023 EXAM: 2 VIEW(S) XRAY OF THE CHEST 09/02/2023 11:01:00 AM COMPARISON: 04/22/2023 CLINICAL HISTORY: sob. woke up with SOB. Pt reports some chest tightness. Pt hypertensive in triage FINDINGS: LUNGS AND PLEURA: Some increasing coarse opacities at the right lung base, with new blunting of the right lateral Costophrenic Angle, suggesting a small effusion. HEART AND MEDIASTINUM: Coronary and aortic calcification. BONES AND SOFT TISSUES: No acute osseous abnormality. IMPRESSION:  1. Increasing coarse opacities at the right lung base with new blunting of the right lateral costophrenic angle, suggesting a small effusion. Electronically signed by: Katheleen Faes MD 09/02/2023 11:28 AM EDT RP Workstation: [id number]    Microbiology: Results for orders placed or performed in visit on 08/16/23  Microscopic Examination     Status: Abnormal   Collection Time: 08/16/23  2:11 PM   Urine  Result Value Ref Range Status   WBC, UA 11-30 (A) 0 - 5 /hpf Final   RBC, Urine 3-10 (A) 0 - 2 /hpf Final   Epithelial Cells (non renal) 0-10 0 - 10 /hpf Final   Bacteria, UA Few None seen/Few Final    Labs: CBC: Recent Labs  Lab 09/02/23 1050  WBC 7.3  HGB 8.5*  HCT 28.1*  MCV 102.2*  PLT 245   Basic Metabolic Panel: Recent Labs  Lab 09/02/23 1050 09/03/23 0154  NA 143 140  K 5.5* 4.4  CL 114* 111  CO2 18* 20*  GLUCOSE 81 113*  BUN 64* 65*  CREATININE 7.59* 7.16*  CALCIUM  7.6* 7.6*   Liver Function Tests: No results for input(s): AST, ALT, ALKPHOS,  BILITOT, PROT, ALBUMIN in the last 168 hours. CBG: Recent Labs  Lab 09/02/23 1522  GLUCAP 97    Discharge time spent: greater than 30 minutes.  This record has been created using Conservation officer, historic buildings. Errors have been sought and corrected,but may not always be located. Such creation errors do not reflect on the standard of care.   Signed: Amaryllis Dare, MD [facility] 09/03/2023

## 2023-09-03 NOTE — Progress Notes (Signed)
   Peritoneal Dialysis Treatment Disconnect Note    Consent signed and in chart.  PD treatment disconnected via aseptic technique.    Patient is awake and alert. No complaints of pain.    PD exit site clean, dry and intact.     Hand-off given to the patient's nurse.        Lynann Beaver LPN [facility]

## 2023-09-03 NOTE — Progress Notes (Signed)
 Pt has called spouse , no answer , to come take him home, will attempt again later. Pt given dc/rx instructions. Pt advised to return to an ED as needed/worse. Pt voices understanding. Pt to call when spouse arrives. Pt with no complaints, no distress. Pt has his dentures, and clothing  no other valuables nor belongings.

## 2023-09-04 DIAGNOSIS — N186 End stage renal disease: Secondary | ICD-10-CM | POA: Diagnosis not present

## 2023-09-04 DIAGNOSIS — Z992 Dependence on renal dialysis: Secondary | ICD-10-CM | POA: Diagnosis not present

## 2023-09-05 DIAGNOSIS — Z992 Dependence on renal dialysis: Secondary | ICD-10-CM | POA: Diagnosis not present

## 2023-09-05 DIAGNOSIS — N186 End stage renal disease: Secondary | ICD-10-CM | POA: Diagnosis not present

## 2023-09-06 DIAGNOSIS — Z992 Dependence on renal dialysis: Secondary | ICD-10-CM | POA: Diagnosis not present

## 2023-09-06 DIAGNOSIS — N186 End stage renal disease: Secondary | ICD-10-CM | POA: Diagnosis not present

## 2023-09-07 DIAGNOSIS — N186 End stage renal disease: Secondary | ICD-10-CM | POA: Diagnosis not present

## 2023-09-07 DIAGNOSIS — Z992 Dependence on renal dialysis: Secondary | ICD-10-CM | POA: Diagnosis not present

## 2023-09-08 DIAGNOSIS — Z992 Dependence on renal dialysis: Secondary | ICD-10-CM | POA: Diagnosis not present

## 2023-09-08 DIAGNOSIS — N186 End stage renal disease: Secondary | ICD-10-CM | POA: Diagnosis not present

## 2023-09-09 DIAGNOSIS — Z992 Dependence on renal dialysis: Secondary | ICD-10-CM | POA: Diagnosis not present

## 2023-09-09 DIAGNOSIS — N186 End stage renal disease: Secondary | ICD-10-CM | POA: Diagnosis not present

## 2023-09-10 DIAGNOSIS — Z992 Dependence on renal dialysis: Secondary | ICD-10-CM | POA: Diagnosis not present

## 2023-09-10 DIAGNOSIS — N186 End stage renal disease: Secondary | ICD-10-CM | POA: Diagnosis not present

## 2023-09-11 DIAGNOSIS — N186 End stage renal disease: Secondary | ICD-10-CM | POA: Diagnosis not present

## 2023-09-11 DIAGNOSIS — Z992 Dependence on renal dialysis: Secondary | ICD-10-CM | POA: Diagnosis not present

## 2023-09-12 DIAGNOSIS — I509 Heart failure, unspecified: Secondary | ICD-10-CM | POA: Diagnosis not present

## 2023-09-12 DIAGNOSIS — N186 End stage renal disease: Secondary | ICD-10-CM | POA: Diagnosis not present

## 2023-09-12 DIAGNOSIS — I132 Hypertensive heart and chronic kidney disease with heart failure and with stage 5 chronic kidney disease, or end stage renal disease: Secondary | ICD-10-CM | POA: Diagnosis not present

## 2023-09-12 DIAGNOSIS — Z992 Dependence on renal dialysis: Secondary | ICD-10-CM | POA: Diagnosis not present

## 2023-09-12 DIAGNOSIS — E875 Hyperkalemia: Secondary | ICD-10-CM | POA: Diagnosis not present

## 2023-09-13 DIAGNOSIS — N186 End stage renal disease: Secondary | ICD-10-CM | POA: Diagnosis not present

## 2023-09-13 DIAGNOSIS — Z992 Dependence on renal dialysis: Secondary | ICD-10-CM | POA: Diagnosis not present

## 2023-09-14 DIAGNOSIS — E875 Hyperkalemia: Secondary | ICD-10-CM | POA: Diagnosis not present

## 2023-09-16 DIAGNOSIS — N186 End stage renal disease: Secondary | ICD-10-CM | POA: Diagnosis not present

## 2023-09-16 DIAGNOSIS — Z992 Dependence on renal dialysis: Secondary | ICD-10-CM | POA: Diagnosis not present

## 2023-09-17 DIAGNOSIS — N186 End stage renal disease: Secondary | ICD-10-CM | POA: Diagnosis not present

## 2023-09-17 DIAGNOSIS — Z992 Dependence on renal dialysis: Secondary | ICD-10-CM | POA: Diagnosis not present

## 2023-09-18 DIAGNOSIS — Z992 Dependence on renal dialysis: Secondary | ICD-10-CM | POA: Diagnosis not present

## 2023-09-18 DIAGNOSIS — N186 End stage renal disease: Secondary | ICD-10-CM | POA: Diagnosis not present

## 2023-09-19 DIAGNOSIS — E875 Hyperkalemia: Secondary | ICD-10-CM | POA: Diagnosis not present

## 2023-09-19 DIAGNOSIS — Z992 Dependence on renal dialysis: Secondary | ICD-10-CM | POA: Diagnosis not present

## 2023-09-19 DIAGNOSIS — N186 End stage renal disease: Secondary | ICD-10-CM | POA: Diagnosis not present

## 2023-09-20 ENCOUNTER — Other Ambulatory Visit: Payer: Self-pay

## 2023-09-20 ENCOUNTER — Inpatient Hospital Stay
Admission: EM | Admit: 2023-09-20 | Discharge: 2023-09-22 | DRG: 640 | Disposition: A | Discharge status: Home / Self Care | Source: Ambulatory Visit | Attending: Student | Admitting: Student

## 2023-09-20 DIAGNOSIS — Q245 Malformation of coronary vessels: Secondary | ICD-10-CM

## 2023-09-20 DIAGNOSIS — I480 Paroxysmal atrial fibrillation: Secondary | ICD-10-CM | POA: Diagnosis present

## 2023-09-20 DIAGNOSIS — E785 Hyperlipidemia, unspecified: Secondary | ICD-10-CM | POA: Diagnosis present

## 2023-09-20 DIAGNOSIS — R1110 Vomiting, unspecified: Secondary | ICD-10-CM | POA: Diagnosis not present

## 2023-09-20 DIAGNOSIS — I2510 Atherosclerotic heart disease of native coronary artery without angina pectoris: Secondary | ICD-10-CM | POA: Diagnosis present

## 2023-09-20 DIAGNOSIS — M109 Gout, unspecified: Secondary | ICD-10-CM | POA: Diagnosis present

## 2023-09-20 DIAGNOSIS — Z66 Do not resuscitate: Secondary | ICD-10-CM | POA: Diagnosis present

## 2023-09-20 DIAGNOSIS — Z7901 Long term (current) use of anticoagulants: Secondary | ICD-10-CM

## 2023-09-20 DIAGNOSIS — E1140 Type 2 diabetes mellitus with diabetic neuropathy, unspecified: Secondary | ICD-10-CM | POA: Diagnosis present

## 2023-09-20 DIAGNOSIS — K219 Gastro-esophageal reflux disease without esophagitis: Secondary | ICD-10-CM | POA: Diagnosis present

## 2023-09-20 DIAGNOSIS — E1122 Type 2 diabetes mellitus with diabetic chronic kidney disease: Secondary | ICD-10-CM | POA: Diagnosis present

## 2023-09-20 DIAGNOSIS — I252 Old myocardial infarction: Secondary | ICD-10-CM

## 2023-09-20 DIAGNOSIS — Z91158 Patient's noncompliance with renal dialysis for other reason: Secondary | ICD-10-CM

## 2023-09-20 DIAGNOSIS — G629 Polyneuropathy, unspecified: Secondary | ICD-10-CM

## 2023-09-20 DIAGNOSIS — I120 Hypertensive chronic kidney disease with stage 5 chronic kidney disease or end stage renal disease: Secondary | ICD-10-CM | POA: Diagnosis present

## 2023-09-20 DIAGNOSIS — D7589 Other specified diseases of blood and blood-forming organs: Secondary | ICD-10-CM | POA: Diagnosis present

## 2023-09-20 DIAGNOSIS — Z7982 Long term (current) use of aspirin: Secondary | ICD-10-CM

## 2023-09-20 DIAGNOSIS — Z79899 Other long term (current) drug therapy: Secondary | ICD-10-CM

## 2023-09-20 DIAGNOSIS — Z8673 Personal history of transient ischemic attack (TIA), and cerebral infarction without residual deficits: Secondary | ICD-10-CM

## 2023-09-20 DIAGNOSIS — E875 Hyperkalemia: Principal | ICD-10-CM | POA: Diagnosis present

## 2023-09-20 DIAGNOSIS — Z992 Dependence on renal dialysis: Secondary | ICD-10-CM | POA: Diagnosis not present

## 2023-09-20 DIAGNOSIS — E1151 Type 2 diabetes mellitus with diabetic peripheral angiopathy without gangrene: Secondary | ICD-10-CM | POA: Diagnosis present

## 2023-09-20 DIAGNOSIS — E538 Deficiency of other specified B group vitamins: Secondary | ICD-10-CM | POA: Diagnosis present

## 2023-09-20 DIAGNOSIS — D631 Anemia in chronic kidney disease: Secondary | ICD-10-CM | POA: Diagnosis present

## 2023-09-20 DIAGNOSIS — Z955 Presence of coronary angioplasty implant and graft: Secondary | ICD-10-CM

## 2023-09-20 DIAGNOSIS — Z885 Allergy status to narcotic agent status: Secondary | ICD-10-CM

## 2023-09-20 DIAGNOSIS — N186 End stage renal disease: Secondary | ICD-10-CM | POA: Diagnosis present

## 2023-09-20 DIAGNOSIS — Z681 Body mass index (BMI) 19 or less, adult: Secondary | ICD-10-CM

## 2023-09-20 DIAGNOSIS — Z96642 Presence of left artificial hip joint: Secondary | ICD-10-CM | POA: Diagnosis present

## 2023-09-20 DIAGNOSIS — Z8249 Family history of ischemic heart disease and other diseases of the circulatory system: Secondary | ICD-10-CM

## 2023-09-20 DIAGNOSIS — I10 Essential (primary) hypertension: Secondary | ICD-10-CM | POA: Diagnosis not present

## 2023-09-20 DIAGNOSIS — Z87891 Personal history of nicotine dependence: Secondary | ICD-10-CM

## 2023-09-20 LAB — CBC WITH DIFFERENTIAL/PLATELET
Abs Immature Granulocytes: 0.03 K/uL (ref 0.00–0.07)
Basophils Absolute: 0.1 K/uL (ref 0.0–0.1)
Basophils Relative: 1 %
Eosinophils Absolute: 0.2 K/uL (ref 0.0–0.5)
Eosinophils Relative: 3 %
HCT: 26.5 % — ABNORMAL LOW (ref 39.0–52.0)
Hemoglobin: 8.1 g/dL — ABNORMAL LOW (ref 13.0–17.0)
Immature Granulocytes: 1 %
Lymphocytes Relative: 14 %
Lymphs Abs: 0.9 K/uL (ref 0.7–4.0)
MCH: 30.9 pg (ref 26.0–34.0)
MCHC: 30.6 g/dL (ref 30.0–36.0)
MCV: 101.1 fL — ABNORMAL HIGH (ref 80.0–100.0)
Monocytes Absolute: 0.7 K/uL (ref 0.1–1.0)
Monocytes Relative: 11 %
Neutro Abs: 4.5 K/uL (ref 1.7–7.7)
Neutrophils Relative %: 70 %
Platelets: 242 K/uL (ref 150–400)
RBC: 2.62 MIL/uL — ABNORMAL LOW (ref 4.22–5.81)
RDW: 14.1 % (ref 11.5–15.5)
WBC: 6.3 K/uL (ref 4.0–10.5)
nRBC: 0 % (ref 0.0–0.2)

## 2023-09-20 LAB — COMPREHENSIVE METABOLIC PANEL WITH GFR
ALT: 19 U/L (ref 0–44)
AST: 22 U/L (ref 15–41)
Albumin: 2.7 g/dL — ABNORMAL LOW (ref 3.5–5.0)
Alkaline Phosphatase: 147 U/L — ABNORMAL HIGH (ref 38–126)
Anion gap: 10 (ref 5–15)
BUN: 63 mg/dL — ABNORMAL HIGH (ref 8–23)
CO2: 19 mmol/L — ABNORMAL LOW (ref 22–32)
Calcium: 8 mg/dL — ABNORMAL LOW (ref 8.9–10.3)
Chloride: 113 mmol/L — ABNORMAL HIGH (ref 98–111)
Creatinine, Ser: 8.29 mg/dL — ABNORMAL HIGH (ref 0.61–1.24)
GFR, Estimated: 6 mL/min — ABNORMAL LOW (ref >60)
Glucose, Bld: 89 mg/dL (ref 70–99)
Potassium: 5.9 mmol/L — ABNORMAL HIGH (ref 3.5–5.1)
Sodium: 142 mmol/L (ref 135–145)
Total Bilirubin: 0.6 mg/dL (ref 0.0–1.2)
Total Protein: 6.3 g/dL — ABNORMAL LOW (ref 6.5–8.1)

## 2023-09-20 LAB — PROTIME-INR
INR: 1 (ref 0.8–1.2)
Prothrombin Time: 13.8 s (ref 11.4–15.2)

## 2023-09-20 LAB — CBG MONITORING, ED: Glucose-Capillary: 161 mg/dL — ABNORMAL HIGH (ref 70–99)

## 2023-09-20 MED ORDER — LOKELMA 10 G PO PACK: 10.0000 g | PACK | Freq: Every day | ORAL | 0 refills | Status: AC

## 2023-09-20 MED ORDER — CALCITRIOL 0.25 MCG PO CAPS: 0.2500 ug | ORAL_CAPSULE | Freq: Every day | ORAL | Status: DC

## 2023-09-20 MED ORDER — LOSARTAN POTASSIUM 50 MG PO TABS: 50.0000 mg | ORAL_TABLET | Freq: Every day | ORAL | Status: DC

## 2023-09-20 MED ORDER — PANTOPRAZOLE SODIUM 40 MG PO TBEC
40.0000 mg | DELAYED_RELEASE_TABLET | Freq: Every day | ORAL | Status: DC
  Administered 2023-09-21 – 2023-09-22 (×2): 40 mg via ORAL
  Filled 2023-09-20 (×2): qty 1

## 2023-09-20 MED ORDER — ACETAMINOPHEN 650 MG RE SUPP: 650.0000 mg | Freq: Four times a day (QID) | RECTAL | Status: DC | PRN

## 2023-09-20 MED ORDER — APIXABAN 2.5 MG PO TABS
2.5000 mg | ORAL_TABLET | Freq: Two times a day (BID) | ORAL | Status: DC
  Administered 2023-09-21 – 2023-09-22 (×3): 2.5 mg via ORAL
  Filled 2023-09-20 (×3): qty 1

## 2023-09-20 MED ORDER — ONDANSETRON HCL 4 MG PO TABS
4.0000 mg | ORAL_TABLET | Freq: Four times a day (QID) | ORAL | Status: DC | PRN
  Administered 2023-09-22: 4 mg via ORAL
  Filled 2023-09-20: qty 1

## 2023-09-20 MED ORDER — MAGNESIUM HYDROXIDE 400 MG/5ML PO SUSP: 30.0000 mL | Freq: Every day | ORAL | Status: DC | PRN

## 2023-09-20 MED ORDER — ASPIRIN 81 MG PO TBEC
81.0000 mg | DELAYED_RELEASE_TABLET | Freq: Every day | ORAL | Status: DC
  Administered 2023-09-21 – 2023-09-22 (×2): 81 mg via ORAL
  Filled 2023-09-20 (×2): qty 1

## 2023-09-20 MED ORDER — DEXTROSE 50 % IV SOLN
2.0000 | Freq: Once | INTRAVENOUS | Status: AC
  Administered 2023-09-20: 100 mL via INTRAVENOUS
  Filled 2023-09-20: qty 100

## 2023-09-20 MED ORDER — SODIUM ZIRCONIUM CYCLOSILICATE 10 G PO PACK
10.0000 g | PACK | ORAL | Status: AC
  Administered 2023-09-20: 10 g via ORAL
  Filled 2023-09-20: qty 1

## 2023-09-20 MED ORDER — GABAPENTIN 300 MG PO CAPS
300.0000 mg | ORAL_CAPSULE | Freq: Every day | ORAL | Status: DC
  Administered 2023-09-21 (×2): 300 mg via ORAL
  Filled 2023-09-20 (×2): qty 1

## 2023-09-20 MED ORDER — METOPROLOL SUCCINATE ER 50 MG PO TB24
50.0000 mg | ORAL_TABLET | ORAL | Status: DC
  Administered 2023-09-21 – 2023-09-22 (×2): 50 mg via ORAL
  Filled 2023-09-20 (×2): qty 1

## 2023-09-20 MED ORDER — HYDROCODONE-ACETAMINOPHEN 5-325 MG PO TABS
1.0000 | ORAL_TABLET | Freq: Four times a day (QID) | ORAL | Status: DC | PRN
  Administered 2023-09-21 – 2023-09-22 (×2): 1 via ORAL
  Filled 2023-09-20 (×2): qty 1

## 2023-09-20 MED ORDER — CALCIUM GLUCONATE 10 % IV SOLN
1.0000 g | Freq: Once | INTRAVENOUS | Status: AC
  Administered 2023-09-20: 1 g via INTRAVENOUS
  Filled 2023-09-20: qty 10

## 2023-09-20 MED ORDER — SODIUM BICARBONATE 650 MG PO TABS
1300.0000 mg | ORAL_TABLET | Freq: Two times a day (BID) | ORAL | Status: DC
  Administered 2023-09-21 – 2023-09-22 (×4): 1300 mg via ORAL
  Filled 2023-09-20 (×4): qty 2

## 2023-09-20 MED ORDER — METHOCARBAMOL 500 MG PO TABS: 500.0000 mg | ORAL_TABLET | Freq: Three times a day (TID) | ORAL | Status: DC | PRN

## 2023-09-20 MED ORDER — PSYLLIUM 95 % PO PACK: 1.0000 | PACK | Freq: Two times a day (BID) | ORAL | Status: DC

## 2023-09-20 MED ORDER — NITROGLYCERIN 0.4 MG SL SUBL: 0.4000 mg | SUBLINGUAL_TABLET | SUBLINGUAL | Status: DC | PRN

## 2023-09-20 MED ORDER — TRAZODONE HCL 50 MG PO TABS: 25.0000 mg | ORAL_TABLET | Freq: Every evening | ORAL | Status: DC | PRN

## 2023-09-20 MED ORDER — INSULIN ASPART 100 UNIT/ML IJ SOLN
10.0000 [IU] | Freq: Once | INTRAMUSCULAR | Status: AC
  Administered 2023-09-20: 10 [IU] via INTRAVENOUS
  Filled 2023-09-20: qty 10

## 2023-09-20 MED ORDER — ACETAMINOPHEN 325 MG PO TABS: 650.0000 mg | ORAL_TABLET | Freq: Four times a day (QID) | ORAL | Status: DC | PRN

## 2023-09-20 MED ORDER — ONDANSETRON HCL 4 MG/2ML IJ SOLN: 4.0000 mg | Freq: Four times a day (QID) | INTRAMUSCULAR | Status: DC | PRN

## 2023-09-20 MED ORDER — ROSUVASTATIN CALCIUM 10 MG PO TABS
10.0000 mg | ORAL_TABLET | Freq: Every day | ORAL | Status: DC
  Administered 2023-09-21 – 2023-09-22 (×2): 10 mg via ORAL
  Filled 2023-09-20 (×2): qty 1

## 2023-09-20 MED ORDER — MECLIZINE HCL 25 MG PO TABS: 12.5000 mg | ORAL_TABLET | Freq: Two times a day (BID) | ORAL | Status: DC | PRN

## 2023-09-20 NOTE — Assessment & Plan Note (Signed)
-   Will continue Neurontin .

## 2023-09-20 NOTE — ED Notes (Signed)
 400 ml of urine emptied from pts urinal.

## 2023-09-20 NOTE — Assessment & Plan Note (Signed)
-   We will use statin therapy.

## 2023-09-20 NOTE — ED Provider Notes (Addendum)
 [facility] Provider Note    Event Date/Time   First MD Initiated Contact with Patient 09/20/23 1756     (approximate)   History   Chief Complaint: abnormal labs   HPI  Casey French is a 73 y.o. male with a history of ESRD on peritoneal dialysis, paroxysmal atrial fibrillation on Eliquis  who comes ED due to outpatient labs showing a potassium of 6.2.  Denies any complaints, no chest pain shortness of breath edema or pain, no vomiting or diarrhea.  Reports poor appetite but otherwise normal oral intake.  Reports that he did miss a few peritoneal dialysis sessions because he was busy doing things around the house, but last dialysis session was last night.        Past Medical History:  Diagnosis Date   Acute diverticulitis 06/14/2014   Acute ST elevation myocardial infarction (STEMI) of inferior wall (HCC) 2003   a.) PCI with Cypher stent to the RCA   Anemia of chronic renal failure    Aortic atherosclerosis (HCC)    Bladder tumor    CAP (community acquired pneumonia) 06/2022   Cerebral microvascular disease    Coronary artery disease involving native coronary artery of native heart without angina pectoris 2003   a.) PCI with Cypher stent to the RCA 2003; b.) LHC 08/2003: 100% dRCA, 60% pLAD - med mgmt; c.) LHC/PCI 08/21/2015: occluded RCA at site of prior stent placement, 70% mLAD (FFR 0.79; 3.5 x 12 mm Xience DES), 50% dLAD (FFR 0.70); d.) MV 11/11/2016: no ischemia   Depression    Diverticulosis    End-stage renal disease on peritoneal dialysis (HCC)    GERD (gastroesophageal reflux disease)    GI bleed 05/15/2019   Gout    Heart murmur    Hematemesis 05/15/2019   Hepatic cyst    Hepatomegaly    Hyperlipidemia    Hypertension    Lacunar infarction (HCC) 02/11/2007   a.) CT head 02/11/2007: lacunar infarction involving thalamus and external capsule region on the RIGHT; b.) MRI brain 02/03/2020: multiple remote lacunar infarcts involving  the RIGHT basal ganglia, RIGHT thalamus, and LEFT middle cerebellar peduncle   Long-term use of aspirin  therapy    Myocardial bridge    On apixaban  therapy    Pancolitis (HCC) 09/2022   Paroxysmal atrial fibrillation (HCC)    a.) CHA2DS2VASc = 6 (age, HTN, CVA x 2, prior MI/vascular disease, T2DM) as of 03/11/2023; b.) rate/rhythm maintained on oral metoprolol  succinate; chronically anticoagulated with dose reduced apixaban    Renal cyst, right    Sigmoid diverticulitis 03/2022   T2DM (type 2 diabetes mellitus) (HCC)    Traumatic skull fracture and resulting subarachnoid hemorrhage 03/24/2013   a.) MOI was slipping and falling on ice   Unstable angina [facility])     Current Outpatient Rx   Order #: [id number] Class: Normal   Order #: [id number] Class: OTC   Order #: [id number] Class: Historical Med   Order #: [id number] Class: Historical Med   Order #: [id number] Class: Normal   Order #: [id number] Class: No Print   Order #: [id number] Class: Historical Med   Order #: [id number] Class: Normal   Order #: [id number] Class: Historical Med   Order #: [id number] Class: No Print   Order #: [id number] Class: Normal   Order #: [id number] Class: Historical Med   Order #: [id number] Class: Normal   Order #: [id number] Class: Historical Med   Order #: [id number] Class: Historical Med   Order #: [id number] Class: Normal   Order #: [id number] Class: Normal   Order #: [id number] Class:  Historical Med   Order #: [id number] Class: Historical Med   Order #: [id number] Class: Normal   Order #: [id number] Class: Historical Med    Past Surgical History:  Procedure Laterality Date   BLADDER INSTILLATION N/A 04/05/2023   Procedure: BLADDER INSTILLATION OF GEMCITABINE ;  Surgeon: Twylla Glendia BROCKS, MD;  Location: [facility] ORS;  Service: Urology;  Laterality: N/A;   CAPD INSERTION N/A 08/13/2020   Procedure: LAPAROSCOPIC INSERTION CONTINUOUS AMBULATORY PERITONEAL DIALYSIS  (CAPD) CATHETER;  Surgeon: Lane Shope, MD;  Location: [facility] ORS;   Service: General;  Laterality: N/A;   CARDIAC CATHETERIZATION     CATARACT EXTRACTION W/PHACO Right 07/05/2019   Procedure: CATARACT EXTRACTION PHACO AND INTRAOCULAR LENS PLACEMENT (IOC) RIGHT DIABETIC;  Surgeon: Jaye Fallow, MD;  Location: [facility] ORS;  Service: Ophthalmology;  Laterality: Right;  US00:59.3 CDE8.41 LOT [id number] h   COLONOSCOPY WITH PROPOFOL  N/A 04/01/2017   Procedure: COLONOSCOPY WITH PROPOFOL ;  Surgeon: Gaylyn Gladis PENNER, MD;  Location: [facility] ENDOSCOPY;  Service: Endoscopy;  Laterality: N/A;   CORONARY ANGIOPLASTY WITH STENT PLACEMENT  10/2001   RCA   CORONARY ANGIOPLASTY WITH STENT PLACEMENT Left 08/21/2015   Procedure: CORONARY ANGIOPLASTY WITH STENT PLACEMENT (mid LAD); Location: [facility]; Surgeon: Zachary Car, MD   ESOPHAGOGASTRODUODENOSCOPY N/A 04/24/2023   Procedure: EGD (ESOPHAGOGASTRODUODENOSCOPY);  Surgeon: Toledo, Ladell POUR, MD;  Location: [facility] ENDOSCOPY;  Service: Gastroenterology;  Laterality: N/A;   HAND SURGERY  2015   3 screws in right hand   TOTAL HIP ARTHROPLASTY Left 04/22/2020   Procedure: TOTAL HIP ARTHROPLASTY ANTERIOR APPROACH;  Surgeon: Kathlynn Sharper, MD;  Location: [facility] ORS;  Service: Orthopedics;  Laterality: Left;   TRANSURETHRAL RESECTION OF BLADDER TUMOR N/A 04/05/2023   Procedure: TURBT (TRANSURETHRAL RESECTION OF BLADDER TUMOR);  Surgeon: Twylla Glendia BROCKS, MD;  Location: [facility] ORS;  Service: Urology;  Laterality: N/A;    Physical Exam   Triage Vital Signs: ED Triage Vitals  Encounter Vitals Group     BP 09/20/23 1712 (!) 152/75     Girls Systolic BP Percentile --      Girls Diastolic BP Percentile --      Boys Systolic BP Percentile --      Boys Diastolic BP Percentile --      Pulse Rate 09/20/23 1712 76     Resp 09/20/23 1712 18     Temp 09/20/23 1712 98.3 F (36.8 C)     Temp src --      SpO2 09/20/23 1712 97 %     Weight 09/20/23 1716 130 lb (59 kg)     Height 09/20/23 1716 5' 9 (1.753 m)     Head Circumference --      Peak  Flow --      Pain Score 09/20/23 1716 0     Pain Loc --      Pain Education --      Exclude from Growth Chart --     Most recent vital signs: Vitals:   09/20/23 1712  BP: (!) 152/75  Pulse: 76  Resp: 18  Temp: 98.3 F (36.8 C)  SpO2: 97%    General: Awake, no distress.  CV:  Good peripheral perfusion.  Regular rate Resp:  Normal effort.  Clear lungs Abd:  No distention.  Soft nontender.  PD catheter in place Other:  No lower extremity edema   ED Results / Procedures / Treatments   Labs (all labs ordered are listed, but only abnormal results are displayed) Labs Reviewed  COMPREHENSIVE METABOLIC PANEL WITH GFR - Abnormal; Notable  for the following components:      Result Value   Potassium 5.9 (*)    Chloride 113 (*)    CO2 19 (*)    BUN 63 (*)    Creatinine, Ser 8.29 (*)    Calcium  8.0 (*)    Total Protein 6.3 (*)    Albumin 2.7 (*)    Alkaline Phosphatase 147 (*)    GFR, Estimated 6 (*)    All other components within normal limits  CBC WITH DIFFERENTIAL/PLATELET - Abnormal; Notable for the following components:   RBC 2.62 (*)    Hemoglobin 8.1 (*)    HCT 26.5 (*)    MCV 101.1 (*)    All other components within normal limits  PROTIME-INR  CBC WITH DIFFERENTIAL/PLATELET     EKG Interpreted by me Normal sinus rhythm rate of 71.  Normal axis, normal intervals.  Normal QRS and ST segments.  Peaked T waves anteriorly.   RADIOLOGY    PROCEDURES:  .Critical Care  Performed by: Viviann Pastor, MD Authorized by: Viviann Pastor, MD   Critical care provider statement:    Critical care time (minutes):  35   Critical care time was exclusive of:  Separately billable procedures and treating other patients   Critical care was necessary to treat or prevent imminent or life-threatening deterioration of the following conditions:  Metabolic crisis and renal failure   Critical care was time spent personally by me on the following activities:  Development of  treatment plan with patient or surrogate, discussions with consultants, evaluation of patient's response to treatment, examination of patient, obtaining history from patient or surrogate, ordering and performing treatments and interventions, ordering and review of laboratory studies, ordering and review of radiographic studies, pulse oximetry, re-evaluation of patient's condition and review of old charts   Care discussed with: admitting provider      MEDICATIONS ORDERED IN ED: Medications  sodium zirconium cyclosilicate  (LOKELMA ) packet 10 g (10 g Oral Given 09/20/23 1840)     IMPRESSION / MDM / ASSESSMENT AND PLAN / ED COURSE  I reviewed the triage vital signs and the nursing notes.  DDx: Hyperkalemia, anemia, lab error, cardiac rhythm disturbance  Patient's presentation is most consistent with acute presentation with potential threat to life or bodily function.  Patient sent to the ED with hyperkalemia in the setting of PD noncompliance.  Potassium in the ED is 5.9, EKG shows peaked T waves.  He is not symptomatic, but will need overnight telemetry monitoring to ensure improvement and cardiac stability.  Oral Lokelma  given.  Case discussed with nephrology Dr. Douglas who agrees.   ----------------------------------------- 8:10 PM on 09/20/2023 ----------------------------------------- Case discussed with hospitalist      FINAL CLINICAL IMPRESSION(S) / ED DIAGNOSES   Final diagnoses:  ESRD on peritoneal dialysis [facility])     Rx / DC Orders   ED Discharge Orders          Ordered    sodium zirconium cyclosilicate  (LOKELMA ) 10 g PACK packet  Daily        09/20/23 1844             Note:  This document was prepared using Dragon voice recognition software and may include unintentional dictation errors.   Viviann Pastor, MD 09/20/23 [place]    Viviann Pastor, MD 09/20/23 2010

## 2023-09-20 NOTE — Discharge Instructions (Signed)
 Your potassium level is 5.9 in the ED, which can be managed by taking Lokelma  once daily for the next 3 days to remove excess potassium from your body. Continue doing your peritoneal dialysis and follow up with your doctor later this week for lab recheck.

## 2023-09-20 NOTE — ED Notes (Signed)
 2030 medications due in [facility] are not pharmacy verified at this time.

## 2023-09-20 NOTE — Telephone Encounter (Signed)
 I called patient and spoke with his wife and himself due to his Labs from his PCP in regards to his urgent lab results. After speaking with Dr. Douglas, he advised that patient needs to go to the ED to be evaluated. Patients potassium is 6.2 and he has also not had his dialysis treatments in the last couple of days. Patients wife states that they will head over to the ED.

## 2023-09-20 NOTE — Assessment & Plan Note (Signed)
-   This is associated with end-stage renal disease on hemodialysis. - The patient will be admitted to a progressive unit observation bed. - Hyperkalemia is maximally treated. - Nephrology consult will be obtained. - Dr. Douglas was notified and is aware about the patient.

## 2023-09-20 NOTE — Assessment & Plan Note (Signed)
-   Will continue Eliquis  and Toprol -XL.

## 2023-09-20 NOTE — ED Triage Notes (Signed)
 Pt comes in via pov after being sent over by his primary care doctor for elevated potassium. Pt has no complaints of chest pain, SOB or dizziness. Pt is alert and oriented x4 with no signs of acute distress at this time.

## 2023-09-20 NOTE — Assessment & Plan Note (Signed)
-   Will continue PPI therapy.

## 2023-09-20 NOTE — Assessment & Plan Note (Signed)
-   Will continue antihypertensive therapy.

## 2023-09-20 NOTE — H&P (Signed)
 [place]   PATIENT NAME: Casey French    MR#:  [id number]  DATE OF BIRTH:  [birth year]  DATE OF ADMISSION:  09/20/2023  PRIMARY CARE PHYSICIAN: Valora Agent, MD   Patient is coming from: Home  REQUESTING/REFERRING PHYSICIAN: Viviann Mungo, MD  CHIEF COMPLAINT:   Chief Complaint  Patient presents with   abnormal labs    HISTORY OF PRESENT ILLNESS:  Casey French is a 73 y.o. Caucasian male with medical history significant for coronary artery disease, diverticulitis, depression, ESRD on PD, paroxysmal atrial fibrillation on Eliquis , GERD, dyslipidemia, hypertension, CVA and gout, who presented to the emergency room with Acute onset of hyperkalemia.  He was told by his nephrologist to come to the ED based on outpatient labs.  The patient denies any chest pain or palpitations.  No cough or wheezing or dyspnea.  No nausea or vomiting or abdominal pain.  No diarrhea or melena or bright red bleeding per rectum.  No dysuria, urinary frequency or urgency or flank pain.  ED Course: Upon presentation to the ER, vital signs were within normal.  Labs revealed hyperkalemia of 5.9 and chloride of 113, CO2 19 and BUN 63 with creatinine 8.29 and calcium  8.  Alk phos was 147 and lipase 2.7 and total protein 6.3.  CBC showed hemoglobin 8.1 hematocrit 26.1 close to baseline with macrocytosis  EKG as reviewed by me :  EKG showed normal sinus rhythm with a rate of 71 with minimal voltage criteria for LVH and peaked T waves. Imaging: None.  The patient will given 10 g of p.o. Lokelma , 10 units of IV NovoLog  and 2 A of D50, an amp of calcium  gluconate to 1 g IV he will be admitted to a progressive unit observation bed for further evaluation and management. PAST MEDICAL HISTORY:   Past Medical History:  Diagnosis Date   Acute diverticulitis 06/14/2014   Acute ST elevation myocardial infarction (STEMI) of inferior wall (HCC) 2003   a.) PCI with Cypher stent to the RCA   Anemia of  chronic renal failure    Aortic atherosclerosis (HCC)    Bladder tumor    CAP (community acquired pneumonia) 06/2022   Cerebral microvascular disease    Coronary artery disease involving native coronary artery of native heart without angina pectoris 2003   a.) PCI with Cypher stent to the RCA 2003; b.) LHC 08/2003: 100% dRCA, 60% pLAD - med mgmt; c.) LHC/PCI 08/21/2015: occluded RCA at site of prior stent placement, 70% mLAD (FFR 0.79; 3.5 x 12 mm Xience DES), 50% dLAD (FFR 0.70); d.) MV 11/11/2016: no ischemia   Depression    Diverticulosis    End-stage renal disease on peritoneal dialysis (HCC)    GERD (gastroesophageal reflux disease)    GI bleed 05/15/2019   Gout    Heart murmur    Hematemesis 05/15/2019   Hepatic cyst    Hepatomegaly    Hyperlipidemia    Hypertension    Lacunar infarction (HCC) 02/11/2007   a.) CT head 02/11/2007: lacunar infarction involving thalamus and external capsule region on the RIGHT; b.) MRI brain 02/03/2020: multiple remote lacunar infarcts involving the RIGHT basal ganglia, RIGHT thalamus, and LEFT middle cerebellar peduncle   Long-term use of aspirin  therapy    Myocardial bridge    On apixaban  therapy    Pancolitis (HCC) 09/2022   Paroxysmal atrial fibrillation (HCC)    a.) CHA2DS2VASc = 6 (age, HTN, CVA x 2, prior MI/vascular disease, T2DM) as of  03/11/2023; b.) rate/rhythm maintained on oral metoprolol  succinate; chronically anticoagulated with dose reduced apixaban    Renal cyst, right    Sigmoid diverticulitis 03/2022   T2DM (type 2 diabetes mellitus) (HCC)    Traumatic skull fracture and resulting subarachnoid hemorrhage 03/24/2013   a.) MOI was slipping and falling on ice   Unstable angina (HCC)     PAST SURGICAL HISTORY:   Past Surgical History:  Procedure Laterality Date   BLADDER INSTILLATION N/A 04/05/2023   Procedure: BLADDER INSTILLATION OF GEMCITABINE ;  Surgeon: Twylla Glendia BROCKS, MD;  Location: [facility] ORS;  Service: Urology;   Laterality: N/A;   CAPD INSERTION N/A 08/13/2020   Procedure: LAPAROSCOPIC INSERTION CONTINUOUS AMBULATORY PERITONEAL DIALYSIS  (CAPD) CATHETER;  Surgeon: Lane Shope, MD;  Location: [facility] ORS;  Service: General;  Laterality: N/A;   CARDIAC CATHETERIZATION     CATARACT EXTRACTION W/PHACO Right 07/05/2019   Procedure: CATARACT EXTRACTION PHACO AND INTRAOCULAR LENS PLACEMENT (IOC) RIGHT DIABETIC;  Surgeon: Jaye Fallow, MD;  Location: [facility] ORS;  Service: Ophthalmology;  Laterality: Right;  US00:59.3 CDE8.41 LOT [id number] h   COLONOSCOPY WITH PROPOFOL  N/A 04/01/2017   Procedure: COLONOSCOPY WITH PROPOFOL ;  Surgeon: Gaylyn Gladis PENNER, MD;  Location: [facility] ENDOSCOPY;  Service: Endoscopy;  Laterality: N/A;   CORONARY ANGIOPLASTY WITH STENT PLACEMENT  10/2001   RCA   CORONARY ANGIOPLASTY WITH STENT PLACEMENT Left 08/21/2015   Procedure: CORONARY ANGIOPLASTY WITH STENT PLACEMENT (mid LAD); Location: [facility]; Surgeon: Zachary Car, MD   ESOPHAGOGASTRODUODENOSCOPY N/A 04/24/2023   Procedure: EGD (ESOPHAGOGASTRODUODENOSCOPY);  Surgeon: Toledo, Ladell POUR, MD;  Location: [facility] ENDOSCOPY;  Service: Gastroenterology;  Laterality: N/A;   HAND SURGERY  2015   3 screws in right hand   TOTAL HIP ARTHROPLASTY Left 04/22/2020   Procedure: TOTAL HIP ARTHROPLASTY ANTERIOR APPROACH;  Surgeon: Kathlynn Sharper, MD;  Location: [facility];  Service: Orthopedics;  Laterality: Left;   TRANSURETHRAL RESECTION OF BLADDER TUMOR N/A 04/05/2023   Procedure: TURBT (TRANSURETHRAL RESECTION OF BLADDER TUMOR);  Surgeon: Twylla Glendia BROCKS, MD;  Location: [facility] OR[facility];  Service: Urology;  Laterality: N/A;    SOCIAL HISTORY:   Social History   Tobacco Use   Smoking status: Former    Current packs/day: 0.00    Types: Cigarettes    Quit date: 03/24/2001    Years since quitting: 22.5   Smokeless tobacco: Never  Substance Use Topics   Alcohol use: No    Alcohol/week: 0.0 standard drinks of alcohol    FAMILY HISTORY:   Family  History  Problem Relation Age of Onset   Heart disease Mother    Heart disease Father    CAD Other     DRUG ALLERGIES:   Allergies  Allergen Reactions   Morphine  Anxiety    agitation     REVIEW OF SYSTEMS:   ROS As per history of present illness. All pertinent systems were reviewed above. Constitutional, HEENT, cardiovascular, respiratory, GI, GU, musculoskeletal, neuro, psychiatric, endocrine, integumentary and hematologic systems were reviewed and are otherwise negative/unremarkable except for positive findings mentioned above in the HPI.   MEDICATIONS AT HOME:   Prior to Admission medications   Medication Sig Start Date End Date Taking? Authorizing Provider  sodium zirconium cyclosilicate  (LOKELMA ) 10 g PACK packet Take 10 g by mouth daily for 3 days. 09/20/23 09/23/23 Yes Viviann Pastor, MD  acetaminophen  (TYLENOL ) 325 MG tablet Take 2 tablets (650 mg total) by mouth every 6 (six) hours as needed for mild pain (or Fever >/= 101). 10/11/22   Jens Durand, MD  apixaban  (ELIQUIS ) 2.5 MG TABS tablet Take 2.5 mg by mouth 2 (two) times daily.    [provider]  aspirin  81 MG EC tablet Take 81 mg by mouth daily.    [provider]  calcitRIOL  (ROCALTROL ) 0.25 MCG capsule Take 1 capsule (0.25 mcg total) by mouth daily. 04/25/22   Dorinda Drue DASEN, MD  folic acid  (FOLVITE ) 1 MG tablet Take 1 tablet (1 mg total) by mouth daily. Take 1 tablet daily Patient not taking: Reported on 09/02/2023 04/24/23   Darci Pore, MD  gabapentin  (NEURONTIN ) 300 MG capsule Take 300 mg by mouth at bedtime. 09/25/16   [provider]  HYDROcodone -acetaminophen  (NORCO/VICODIN) 5-325 MG tablet Take 1 tablet by mouth every 6 (six) hours as needed for moderate pain (pain score 4-6). 04/05/23   Stoioff, Glendia BROCKS, MD  hydrocortisone  (ANUSOL -HC) 2.5 % rectal cream Place 1 application rectally 2 (two) times daily as needed for hemorrhoids or anal itching.    [provider]   losartan  (COZAAR ) 100 MG tablet Take 0.5 tablets (50 mg total) by mouth daily. 04/24/23   Darci Pore, MD  meclizine  (ANTIVERT ) 25 MG tablet Take 1 tablet (25 mg total) by mouth 2 (two) times daily. 04/24/22   Dorinda Drue DASEN, MD  methocarbamol  (ROBAXIN ) 500 MG tablet Take 500 mg by mouth 3 (three) times daily as needed. 11/13/20   [provider]  metoprolol  succinate (TOPROL -XL) 100 MG 24 hr tablet Take 0.5 tablets (50 mg total) by mouth every morning. Take with or immediately following a meal. 04/24/23   Darci Pore, MD  nitroGLYCERIN  (NITROSTAT ) 0.4 MG SL tablet Place 0.4 mg under the tongue every 5 (five) minutes as needed for chest pain.    [provider]  ondansetron  (ZOFRAN ) 4 MG tablet Take 4 mg by mouth every 8 (eight) hours as needed for nausea or vomiting. 04/22/23   [provider]  ondansetron  (ZOFRAN -ODT) 4 MG disintegrating tablet Take 1 tablet (4 mg total) by mouth every 8 (eight) hours as needed. 03/15/23   Claudene Rover, MD  pantoprazole  (PROTONIX ) 40 MG tablet Take 1 tablet (40 mg total) by mouth daily. 04/24/23 04/23/24  Darci Pore, MD  psyllium (METAMUCIL) 58.6 % packet Take 1 packet by mouth 2 (two) times daily.    [provider]  rosuvastatin  (CRESTOR ) 40 MG tablet Take 40 mg by mouth every morning. 03/06/19   [provider]  sodium bicarbonate  650 MG tablet Take 2 tablets (1,300 mg total) by mouth 2 (two) times daily. 09/03/23   Caleen Qualia, MD  Vitamin D , Ergocalciferol , (DRISDOL) 1.25 MG (50000 UNIT) CAPS capsule Take 50,000 Units by mouth once a week. Patient not taking: Reported on 09/02/2023 10/23/20   [provider]      VITAL SIGNS:  Blood pressure (!) 152/75, pulse 76, temperature 98.3 F (36.8 C), resp. rate 18, height 5' 9 (1.753 m), weight 59 kg, SpO2 97%.  PHYSICAL EXAMINATION:  Physical Exam  GENERAL:  73 y.o.-year-old Caucasian male patient lying in the bed with no acute  distress.  EYES: Pupils equal, round, reactive to light and accommodation. No scleral icterus. Extraocular muscles intact.  HEENT: Head atraumatic, normocephalic. Oropharynx and nasopharynx clear.  NECK:  Supple, no jugular venous distention. No thyroid  enlargement, no tenderness.  LUNGS: Normal breath sounds bilaterally, no wheezing, rales,rhonchi or crepitation. No use of accessory muscles of respiratio essential n.  CARDIOVASCULAR: Regular rate and rhythm, S1, S2 normal. No murmurs, rubs, or gallops.  ABDOMEN: Soft,  nondistended, nontender. Bowel sounds present. No organomegaly or mass.  EXTREMITIES: No pedal edema, cyanosis, or clubbing.  NEUROLOGIC: Cranial nerves II through XII are intact. Muscle strength 5/5 in all extremities. Sensation intact. Gait not checked.  PSYCHIATRIC: The patient is alert and oriented x 3.  Normal affect and good eye contact. SKIN: No obvious rash, lesion, or ulcer.   LABORATORY PANEL:   CBC Recent Labs  Lab 09/20/23 1802  WBC 6.3  HGB 8.1*  HCT 26.5*  PLT 242   ------------------------------------------------------------------------------------------------------------------  Chemistries  Recent Labs  Lab 09/20/23 1718  NA 142  K 5.9*  CL 113*  CO2 19*  GLUCOSE 89  BUN 63*  CREATININE 8.29*  CALCIUM  8.0*  AST 22  ALT 19  ALKPHOS 147*  BILITOT 0.6   ------------------------------------------------------------------------------------------------------------------  Cardiac Enzymes No results for input(s): TROPONINI in the last 168 hours. ------------------------------------------------------------------------------------------------------------------  RADIOLOGY:  No results found.    IMPRESSION AND PLAN:  Assessment and Plan: * Hyperkalemia - This is associated with end-stage renal disease on hemodialysis. - The patient will be admitted to a progressive unit observation bed. - Hyperkalemia is maximally treated. - Nephrology  consult will be obtained. - Dr. Douglas was notified and is aware about the patient.  Paroxysmal atrial fibrillation (HCC) - Will continue Eliquis  and Toprol -XL.  Peripheral neuropathy Will continue Neurontin .  Dyslipidemia - We will use statin therapy.  Essential hypertension - Will continue antihypertensive therapy.  GERD without esophagitis - Will continue PPI therapy.   DVT prophylaxis: Eliquis . Advanced Care Planning:  Code Status: The patient is DNR only. Family Communication:  The plan of care was discussed in details with the patient (and family). I answered all questions. The patient agreed to proceed with the above mentioned plan. Further management will depend upon hospital course. Disposition Plan: Back to previous home environment Consults called: none. All the records are reviewed and case discussed with ED provider.  Status is: Observation  I certify that at the time of admission, it is my clinical judgment that the patient will require  hospital care extending less than 2 midnights.                            Dispo: The patient is from: Home              Anticipated d/c is to: Home              Patient currently is not medically stable to d/c.              Difficult to place patient: No  [place]ison DELENA Peaches M.D on 09/20/2023 at 10:06 PM  [facility]   From 7 PM-7 AM, contact night-coverage [email or web address]  CC: Primary care physician; Valora Agent, MD

## 2023-09-21 DIAGNOSIS — Z66 Do not resuscitate: Secondary | ICD-10-CM | POA: Diagnosis present

## 2023-09-21 DIAGNOSIS — M109 Gout, unspecified: Secondary | ICD-10-CM | POA: Diagnosis present

## 2023-09-21 DIAGNOSIS — E1140 Type 2 diabetes mellitus with diabetic neuropathy, unspecified: Secondary | ICD-10-CM | POA: Diagnosis present

## 2023-09-21 DIAGNOSIS — Z681 Body mass index (BMI) 19 or less, adult: Secondary | ICD-10-CM | POA: Diagnosis not present

## 2023-09-21 DIAGNOSIS — Q245 Malformation of coronary vessels: Secondary | ICD-10-CM | POA: Diagnosis not present

## 2023-09-21 DIAGNOSIS — Z7901 Long term (current) use of anticoagulants: Secondary | ICD-10-CM | POA: Diagnosis not present

## 2023-09-21 DIAGNOSIS — R1110 Vomiting, unspecified: Secondary | ICD-10-CM | POA: Diagnosis not present

## 2023-09-21 DIAGNOSIS — I10 Essential (primary) hypertension: Secondary | ICD-10-CM | POA: Diagnosis not present

## 2023-09-21 DIAGNOSIS — Z7982 Long term (current) use of aspirin: Secondary | ICD-10-CM | POA: Diagnosis not present

## 2023-09-21 DIAGNOSIS — Z79899 Other long term (current) drug therapy: Secondary | ICD-10-CM | POA: Diagnosis not present

## 2023-09-21 DIAGNOSIS — E538 Deficiency of other specified B group vitamins: Secondary | ICD-10-CM | POA: Diagnosis present

## 2023-09-21 DIAGNOSIS — I480 Paroxysmal atrial fibrillation: Secondary | ICD-10-CM | POA: Diagnosis present

## 2023-09-21 DIAGNOSIS — E1151 Type 2 diabetes mellitus with diabetic peripheral angiopathy without gangrene: Secondary | ICD-10-CM | POA: Diagnosis present

## 2023-09-21 DIAGNOSIS — E875 Hyperkalemia: Secondary | ICD-10-CM | POA: Diagnosis not present

## 2023-09-21 DIAGNOSIS — I120 Hypertensive chronic kidney disease with stage 5 chronic kidney disease or end stage renal disease: Secondary | ICD-10-CM | POA: Diagnosis present

## 2023-09-21 DIAGNOSIS — K219 Gastro-esophageal reflux disease without esophagitis: Secondary | ICD-10-CM | POA: Diagnosis present

## 2023-09-21 DIAGNOSIS — Z91158 Patient's noncompliance with renal dialysis for other reason: Secondary | ICD-10-CM | POA: Diagnosis not present

## 2023-09-21 DIAGNOSIS — I2510 Atherosclerotic heart disease of native coronary artery without angina pectoris: Secondary | ICD-10-CM | POA: Diagnosis present

## 2023-09-21 DIAGNOSIS — D7589 Other specified diseases of blood and blood-forming organs: Secondary | ICD-10-CM | POA: Diagnosis present

## 2023-09-21 DIAGNOSIS — E1122 Type 2 diabetes mellitus with diabetic chronic kidney disease: Secondary | ICD-10-CM | POA: Diagnosis not present

## 2023-09-21 DIAGNOSIS — N186 End stage renal disease: Secondary | ICD-10-CM | POA: Diagnosis not present

## 2023-09-21 DIAGNOSIS — D631 Anemia in chronic kidney disease: Secondary | ICD-10-CM | POA: Diagnosis not present

## 2023-09-21 DIAGNOSIS — E785 Hyperlipidemia, unspecified: Secondary | ICD-10-CM | POA: Diagnosis present

## 2023-09-21 DIAGNOSIS — Z992 Dependence on renal dialysis: Secondary | ICD-10-CM | POA: Diagnosis not present

## 2023-09-21 LAB — VITAMIN B12: Vitamin B-12: 243 pg/mL (ref 180–914)

## 2023-09-21 LAB — CBC
HCT: 25.6 % — ABNORMAL LOW (ref 39.0–52.0)
Hemoglobin: 8.1 g/dL — ABNORMAL LOW (ref 13.0–17.0)
MCH: 31.3 pg (ref 26.0–34.0)
MCHC: 31.6 g/dL (ref 30.0–36.0)
MCV: 98.8 fL (ref 80.0–100.0)
Platelets: 210 K/uL (ref 150–400)
RBC: 2.59 MIL/uL — ABNORMAL LOW (ref 4.22–5.81)
RDW: 13.9 % (ref 11.5–15.5)
WBC: 6.8 K/uL (ref 4.0–10.5)
nRBC: 0 % (ref 0.0–0.2)

## 2023-09-21 LAB — BASIC METABOLIC PANEL WITH GFR
Anion gap: 10 (ref 5–15)
BUN: 64 mg/dL — ABNORMAL HIGH (ref 8–23)
CO2: 19 mmol/L — ABNORMAL LOW (ref 22–32)
Calcium: 7.5 mg/dL — ABNORMAL LOW (ref 8.9–10.3)
Chloride: 111 mmol/L (ref 98–111)
Creatinine, Ser: 8.35 mg/dL — ABNORMAL HIGH (ref 0.61–1.24)
GFR, Estimated: 6 mL/min — ABNORMAL LOW (ref >60)
Glucose, Bld: 74 mg/dL (ref 70–99)
Potassium: 5.9 mmol/L — ABNORMAL HIGH (ref 3.5–5.1)
Sodium: 140 mmol/L (ref 135–145)

## 2023-09-21 LAB — FOLATE: Folate: 5.4 ng/mL — ABNORMAL LOW (ref >5.9)

## 2023-09-21 LAB — VITAMIN D 25 HYDROXY (VIT D DEFICIENCY, FRACTURES): Vit D, 25-Hydroxy: 35.19 ng/mL (ref 30–100)

## 2023-09-21 MED ORDER — GENTAMICIN SULFATE 0.1 % EX CREA
1.0000 | TOPICAL_CREAM | Freq: Every day | CUTANEOUS | Status: DC
  Administered 2023-09-22: 1 via TOPICAL
  Filled 2023-09-21 (×2): qty 15

## 2023-09-21 MED ORDER — SODIUM ZIRCONIUM CYCLOSILICATE 10 G PO PACK
10.0000 g | PACK | ORAL | Status: AC
  Administered 2023-09-21: 10 g via ORAL
  Filled 2023-09-21 (×2): qty 1

## 2023-09-21 MED ORDER — DELFLEX-LC/2.5% DEXTROSE 394 MOSM/L IP SOLN
INTRAPERITONEAL | Status: DC
  Filled 2023-09-21 (×2): qty 3000

## 2023-09-21 NOTE — Progress Notes (Signed)
 Pts CCPD was initiated at 1850 per orders-no issues with initial drain or fill Pts VS are WNL 2.5% dextrose  PD solution used--total of 89999 orders 8 hours and 23 minutes for therapy time DSD change to pts lft quadrant PD catheter site---no s/s of any redness or any drainage at the exit site--gent cream applied per order---

## 2023-09-21 NOTE — Care Management Obs Status (Signed)
 MEDICARE OBSERVATION STATUS NOTIFICATION   Patient Details  Name: Casey French MRN: [id number] Date of Birth: 1950/05/19   Medicare Observation Status Notification Given:  Yes    Fausto Sampedro W, CMA 09/21/2023, 11:16 AM

## 2023-09-21 NOTE — Progress Notes (Signed)
 [facility]  ROUNDING NOTE   Subjective:   Casey French  is a 73 y.o. male with past medical conditions including hypertension, hyperlipidemia, PAD, gout, type 2 diabetes, paroxysmal atrial fibrillation on Eliquis , and end-stage renal disease on peritoneal dialysis. He presents to the ED due to abnormal labs. He has been admitted for Hyperkalemia [E87.5]  Patient is known to our practice and is followed by [facility] for PD by Dr Douglas. He admits to missing 3 treatments last week. States he went to his PCP yesterday and found to have elevated potassium. Denies recent diet changes.   Labs on ED arrival concerning for potassium 5.9, s bicarb 19, BUNM 63, creatinine 8.29, and hgb 8.1.   We have been consulted to manage dialysis needs.    Objective:  Vital signs in last 24 hours:  Temp:  [97.9 F (36.6 C)-98.3 F (36.8 C)] 98 F (36.7 C) (08/27 1210) Pulse Rate:  [64-89] 64 (08/27 1100) Resp:  [18-27] 19 (08/27 1100) BP: (121-161)/(65-93) 149/70 (08/27 1100) SpO2:  [95 %-99 %] 96 % (08/27 1100) Weight:  [59 kg] 59 kg (08/26 1716)  Weight change:  Filed Weights   09/20/23 1716  Weight: 59 kg    Intake/Output: No intake/output data recorded.   Intake/Output this shift:  No intake/output data recorded.  Physical Exam: General: NAD  Head: Normocephalic, atraumatic. Moist oral mucosal membranes  Eyes: Anicteric  Neck: Supple  Lungs:  Clear to auscultation  Heart: Regular rate and rhythm  Abdomen:  Soft, nontender  Extremities:  No peripheral edema.  Neurologic: Awake, alert, conversant  Skin: Warm,dry, no rash  Access: PD catheter    Basic Metabolic Panel: Recent Labs  Lab 09/20/23 1718 09/21/23 0545  NA 142 140  K 5.9* 5.9*  CL 113* 111  CO2 19* 19*  GLUCOSE 89 74  BUN 63* 64*  CREATININE 8.29* 8.35*  CALCIUM  8.0* 7.5*    Liver Function Tests: Recent Labs  Lab 09/20/23 1718  AST 22  ALT 19  ALKPHOS 147*  BILITOT 0.6  PROT  6.3*  ALBUMIN 2.7*   No results for input(s): LIPASE, AMYLASE in the last 168 hours. No results for input(s): AMMONIA in the last 168 hours.  CBC: Recent Labs  Lab 09/20/23 1802 09/21/23 0545  WBC 6.3 6.8  NEUTROABS 4.5  --   HGB 8.1* 8.1*  HCT 26.5* 25.6*  MCV 101.1* 98.8  PLT 242 210    Cardiac Enzymes: No results for input(s): CKTOTAL, CKMB, CKMBINDEX, TROPONINI in the last 168 hours.  BNP: Invalid input(s): POCBNP  CBG: Recent Labs  Lab 09/20/23 2144  GLUCAP 161*    Microbiology: Results for orders placed or performed in visit on 08/16/23  Microscopic Examination     Status: Abnormal   Collection Time: 08/16/23  2:11 PM   Urine  Result Value Ref Range Status   WBC, UA 11-30 (A) 0 - 5 /hpf Final   RBC, Urine 3-10 (A) 0 - 2 /hpf Final   Epithelial Cells (non renal) 0-10 0 - 10 /hpf Final   Bacteria, UA Few None seen/Few Final    Coagulation Studies: Recent Labs    09/20/23 1718  LABPROT 13.8  INR 1.0    Urinalysis: No results for input(s): COLORURINE, LABSPEC, PHURINE, GLUCOSEU, HGBUR, BILIRUBINUR, KETONESUR, PROTEINUR, UROBILINOGEN, NITRITE, LEUKOCYTESUR in the last 72 hours.  Invalid input(s): APPERANCEUR    Imaging: No results found.   Medications:    dialysis solution 2.5% low-MG/low-CA  apixaban   2.5 mg Oral BID   aspirin  EC  81 mg Oral Daily   gabapentin   300 mg Oral QHS   gentamicin  cream  1 Application Topical Daily   metoprolol  succinate  50 mg Oral BH-q7a   pantoprazole   40 mg Oral Daily   rosuvastatin   10 mg Oral Daily   sodium bicarbonate   1,300 mg Oral BID   acetaminophen  **OR** acetaminophen , HYDROcodone -acetaminophen , magnesium  hydroxide, methocarbamol , nitroGLYCERIN , ondansetron  **OR** ondansetron  (ZOFRAN ) IV, traZODone   Assessment/ Plan:  [name] is a 73 y.o.  male with past medical conditions including hypertension, hyperlipidemia, PAD, gout, type 2 diabetes,  paroxysmal atrial fibrillation on Eliquis , and end-stage renal disease on peritoneal dialysis. He presents to the ED due to abnormal labs. He has been admitted for Hyperkalemia [E87.5]   Hyperkalemia with end stage renal disease on peritoneal dialysis. Last treatment the night before ED arrival. Does admit to missing 3 treatments in the last week. Potassium 5.9 on ED arrival. Given lokelma  10g yesterday. Will order additional dose today. Will schedule PD treatment tonight per outpatient schedule.   2. Anemia of chronic kidney disease Lab Results  Component Value Date   HGB 8.1 (L) 09/21/2023    Patient receives Mircera outpatient.   3. Hypertension with chronic kidney disease. Receiving metoprolol .   4. Diabetes mellitus type II with chronic kidney disease/renal manifestations: noninsulin dependent.  Most recent hemoglobin A1c is 5.4 on 04/18/22.       LOS: 0 Aylin Rhoads 8/27/20251:22 PM

## 2023-09-21 NOTE — ED Notes (Signed)
Floor transport requested.

## 2023-09-21 NOTE — ED Notes (Signed)
 Called CCMD and added pt to board

## 2023-09-21 NOTE — Progress Notes (Signed)
 [facility] Progress Note  Patient: Casey French    FMW:[id number]  DOA: 09/20/2023     Date of Service: the patient was seen and examined on 09/21/2023  Chief Complaint  Patient presents with   abnormal labs   Brief hospital course: VENCENT HAUSCHILD is a 73 y.o. Caucasian male with medical history significant for coronary artery disease, diverticulitis, depression, ESRD on PD, paroxysmal atrial fibrillation on Eliquis , GERD, dyslipidemia, hypertension, CVA and gout, who presented to the emergency room with Acute onset of hyperkalemia.  He was told by his nephrologist to come to the ED based on outpatient labs.  The patient denies any chest pain or palpitations.  No cough or wheezing or dyspnea.  No nausea or vomiting or abdominal pain.  No diarrhea or melena or bright red bleeding per rectum.  No dysuria, urinary frequency or urgency or flank pain.   ED Course: Upon presentation to the ER, vital signs were within normal.  Labs revealed hyperkalemia of 5.9 and chloride of 113, CO2 19 and BUN 63 with creatinine 8.29 and calcium  8.  Alk phos was 147 and lipase 2.7 and total protein 6.3.  CBC showed hemoglobin 8.1 hematocrit 26.1 close to baseline with macrocytosis   EKG as reviewed by me :  EKG showed normal sinus rhythm with a rate of 71 with minimal voltage criteria for LVH and peaked T waves. Imaging: None.   The patient will given 10 g of p.o. Lokelma , 10 units of IV NovoLog  and 2 A of D50, an amp of calcium  gluconate to 1 g IV he will be admitted to a progressive unit observation bed for further evaluation and management.   Assessment and Plan:  Hyperkalemia - This is associated with end-stage renal disease on hemodialysis. - Hyperkalemia is maximally treated. - Nephrology consulted for HD.  Paroxysmal atrial fibrillation (HCC) - continue Eliquis  and Toprol -XL.   Peripheral neuropathy  continue Neurontin .   Dyslipidemia -On statin therapy.   Essential  hypertension - continue antihypertensive therapy.   GERD without esophagitis - continue PPI therapy.    Body mass index is 19.2 kg/m.  Interventions:  Diet: Renal diet DVT Prophylaxis: Eliquis   Advance goals of care discussion: DNR-intervene  Family Communication: family was not present at bedside, at the time of interview.  The pt provided permission to discuss medical plan with the family. Opportunity was given to ask question and all questions were answered satisfactorily.   Disposition:  Pt is from home, admitted with hyperkalemia, still has elevated potassium, needs HD, which precludes a safe discharge. Discharge to home, when cleared by nephrology.  Subjective: No significant events overnight.  Patient was resting comfortably, awaiting for hemodialysis.  Denied any complaints.  Physical Exam: General: NAD, lying comfortably Appear in no distress, affect appropriate Eyes: PERRLA ENT: Oral Mucosa Clear, moist  Neck: no JVD,  Cardiovascular: S1 and S2 Present, no Murmur,  Respiratory: good respiratory effort, Bilateral Air entry equal and Decreased, no Crackles, no wheezes Abdomen: Bowel Sound present, Soft and no tenderness,  Skin: no rashes Extremities: no Pedal edema, no calf tenderness Neurologic: without any new focal findings Gait not checked due to patient safety concerns  Vitals:   09/21/23 1030 09/21/23 1100 09/21/23 1210 09/21/23 1353  BP: 135/65 (!) 149/70  (!) 157/78  Pulse: 64 64  66  Resp: 20 19    Temp:   98 F (36.7 C) 97.9 F (36.6 C)  TempSrc:   Oral Oral  SpO2: 95% 96%  98%  Weight:      Height:       No intake or output data in the 24 hours ending 09/21/23 1646 Filed Weights   09/20/23 1716  Weight: 59 kg    Data Reviewed: I have personally reviewed and interpreted daily labs, tele strips, imagings as discussed above. I reviewed all nursing notes, pharmacy notes, vitals, pertinent old records I have discussed plan of care as  described above with RN and patient/family.  CBC: Recent Labs  Lab 09/20/23 1802 09/21/23 0545  WBC 6.3 6.8  NEUTROABS 4.5  --   HGB 8.1* 8.1*  HCT 26.5* 25.6*  MCV 101.1* 98.8  PLT 242 210   Basic Metabolic Panel: Recent Labs  Lab 09/20/23 1718 09/21/23 0545  NA 142 140  K 5.9* 5.9*  CL 113* 111  CO2 19* 19*  GLUCOSE 89 74  BUN 63* 64*  CREATININE 8.29* 8.35*  CALCIUM  8.0* 7.5*    Studies: No results found.  Scheduled Meds:  apixaban   2.5 mg Oral BID   aspirin  EC  81 mg Oral Daily   gabapentin   300 mg Oral QHS   gentamicin  cream  1 Application Topical Daily   metoprolol  succinate  50 mg Oral BH-q7a   pantoprazole   40 mg Oral Daily   rosuvastatin   10 mg Oral Daily   sodium bicarbonate   1,300 mg Oral BID   Continuous Infusions:  dialysis solution 2.5% low-MG/low-CA     PRN Meds: acetaminophen  **OR** acetaminophen , HYDROcodone -acetaminophen , magnesium  hydroxide, methocarbamol , nitroGLYCERIN , ondansetron  **OR** ondansetron  (ZOFRAN ) IV, traZODone   Time spent: 35 minutes  Author: ELVAN SOR. MD [facility] 09/21/2023 4:46 PM  To reach On-call, see care teams to locate the attending and reach out to them via [email or web address]. If 7PM-7AM, please contact night-coverage If you still have difficulty reaching the attending provider, please page the [facility] (Director on Call) for [facility] on amion for assistance.

## 2023-09-22 DIAGNOSIS — E875 Hyperkalemia: Secondary | ICD-10-CM | POA: Diagnosis not present

## 2023-09-22 LAB — CBC
HCT: 27.1 % — ABNORMAL LOW (ref 39.0–52.0)
Hemoglobin: 8.6 g/dL — ABNORMAL LOW (ref 13.0–17.0)
MCH: 30.8 pg (ref 26.0–34.0)
MCHC: 31.7 g/dL (ref 30.0–36.0)
MCV: 97.1 fL (ref 80.0–100.0)
Platelets: 223 K/uL (ref 150–400)
RBC: 2.79 MIL/uL — ABNORMAL LOW (ref 4.22–5.81)
RDW: 13.6 % (ref 11.5–15.5)
WBC: 6.8 K/uL (ref 4.0–10.5)
nRBC: 0 % (ref 0.0–0.2)

## 2023-09-22 LAB — PHOSPHORUS: Phosphorus: 6 mg/dL — ABNORMAL HIGH (ref 2.5–4.6)

## 2023-09-22 LAB — BASIC METABOLIC PANEL WITH GFR
Anion gap: 10 (ref 5–15)
BUN: 59 mg/dL — ABNORMAL HIGH (ref 8–23)
CO2: 22 mmol/L (ref 22–32)
Calcium: 7.8 mg/dL — ABNORMAL LOW (ref 8.9–10.3)
Chloride: 109 mmol/L (ref 98–111)
Creatinine, Ser: 8.4 mg/dL — ABNORMAL HIGH (ref 0.61–1.24)
GFR, Estimated: 6 mL/min — ABNORMAL LOW (ref >60)
Glucose, Bld: 98 mg/dL (ref 70–99)
Potassium: 4.8 mmol/L (ref 3.5–5.1)
Sodium: 141 mmol/L (ref 135–145)

## 2023-09-22 LAB — MAGNESIUM: Magnesium: 1.9 mg/dL (ref 1.7–2.4)

## 2023-09-22 MED ORDER — HYDRALAZINE HCL 20 MG/ML IJ SOLN: 10.0000 mg | Freq: Four times a day (QID) | INTRAMUSCULAR | Status: DC | PRN

## 2023-09-22 MED ORDER — VITAMIN B-12 1000 MCG PO TABS: 1000.0000 ug | ORAL_TABLET | Freq: Every day | ORAL | Status: DC

## 2023-09-22 MED ORDER — FOLIC ACID 1 MG PO TABS: 1.0000 mg | ORAL_TABLET | Freq: Every day | ORAL | 2 refills | Status: DC

## 2023-09-22 MED ORDER — SEVELAMER CARBONATE 800 MG PO TABS: 800.0000 mg | ORAL_TABLET | Freq: Three times a day (TID) | ORAL | 0 refills | Status: AC

## 2023-09-22 MED ORDER — CYANOCOBALAMIN 1000 MCG PO TABS
1000.0000 ug | ORAL_TABLET | Freq: Every day | ORAL | 2 refills | Status: DC
Start: 2023-09-23 — End: 2023-12-24

## 2023-09-22 MED ORDER — CALCITRIOL 0.25 MCG PO CAPS
0.2500 ug | ORAL_CAPSULE | Freq: Every day | ORAL | Status: DC
  Administered 2023-09-22: 0.25 ug via ORAL
  Filled 2023-09-22: qty 1

## 2023-09-22 MED ORDER — ISOSORBIDE MONONITRATE ER 30 MG PO TB24
30.0000 mg | ORAL_TABLET | Freq: Every day | ORAL | Status: DC
  Administered 2023-09-22: 30 mg via ORAL
  Filled 2023-09-22: qty 1

## 2023-09-22 MED ORDER — SODIUM BICARBONATE 650 MG PO TABS: 1300.0000 mg | ORAL_TABLET | Freq: Two times a day (BID) | ORAL | 11 refills | Status: DC

## 2023-09-22 MED ORDER — ISOSORBIDE MONONITRATE ER 30 MG PO TB24: 30.0000 mg | ORAL_TABLET | Freq: Every day | ORAL | 1 refills | Status: DC | PRN

## 2023-09-22 MED ORDER — HYDRALAZINE HCL 50 MG PO TABS: 50.0000 mg | ORAL_TABLET | Freq: Four times a day (QID) | ORAL | Status: DC | PRN

## 2023-09-22 MED ORDER — GABAPENTIN 300 MG PO CAPS: 300.0000 mg | ORAL_CAPSULE | Freq: Every evening | ORAL | Status: DC | PRN

## 2023-09-22 MED ORDER — CALCITRIOL 0.25 MCG PO CAPS: 0.2500 ug | ORAL_CAPSULE | Freq: Every day | ORAL | 2 refills | Status: DC

## 2023-09-22 MED ORDER — CYANOCOBALAMIN 1000 MCG/ML IJ SOLN
1000.0000 ug | Freq: Once | INTRAMUSCULAR | Status: AC
  Administered 2023-09-22: 1000 ug via INTRAMUSCULAR
  Filled 2023-09-22: qty 1

## 2023-09-22 MED ORDER — SEVELAMER CARBONATE 800 MG PO TABS
800.0000 mg | ORAL_TABLET | Freq: Three times a day (TID) | ORAL | Status: DC
  Administered 2023-09-22: 800 mg via ORAL
  Filled 2023-09-22: qty 1

## 2023-09-22 NOTE — TOC Transition Note (Signed)
 Transition of Care [facility]) - Discharge Note   Patient Details  Name: Casey French MRN: [id number] Date of Birth: [birth year]  Transition of Care [facility]) CM/SW Contact:  [name], LCSW Phone Number: 09/22/2023, 3:19 PM   Clinical Narrative:   Patient has orders to discharge home today. Readmission prevention screen complete. CSW met with patient. No supports at bedside. CSW introduced role and explained that discharge planning would be discussed. PCP is Lynwood Null, MD. He drives himself to appointments. Pharmacy is [facility] on the corner of [address] and [address]. No issues obtaining medications. Patient lives home with his wife. No home health or DME use prior to admission. No further concerns. His wife will transport him home at discharge. CSW signing off.  Final next level of care: Home/Self Care Barriers to Discharge: Barriers Resolved   Patient Goals and CMS Choice            Discharge Placement                Patient to be transferred to facility by: Wife   Patient and family notified of of transfer: 09/22/23  Discharge Plan and Services Additional resources added to the After Visit Summary for                                       Social Drivers of Health (SDOH) Interventions SDOH Screenings   Food Insecurity: No Food Insecurity (09/02/2023)  Housing: Low Risk  (09/02/2023)  Transportation Needs: No Transportation Needs (09/02/2023)  Utilities: Not At Risk (09/02/2023)  Financial Resource Strain: Patient Declined (11/24/2022)   Received from [facility]  Social Connections: Unknown (09/02/2023)  Tobacco Use: Medium Risk (09/20/2023)     Readmission Risk Interventions    09/22/2023    3:18 PM 10/09/2022   11:58 AM 06/28/2022   10:16 AM  Readmission Risk Prevention Plan  Transportation Screening Complete Complete Complete  PCP or Specialist Appt within 3-5 Days  Complete Complete  HRI or Home Care Consult   Complete   Social Work Consult for Recovery Care Planning/Counseling  Complete Complete  Palliative Care Screening  Not Applicable Not Applicable  Medication Review Oceanographer) Complete Complete Complete  PCP or Specialist appointment within 3-5 days of discharge Complete    SW Recovery Care/Counseling Consult Complete    Palliative Care Screening Not Applicable    Skilled Nursing Facility Not Applicable

## 2023-09-22 NOTE — Progress Notes (Signed)
 [facility]  ROUNDING NOTE   Subjective:   Casey French  is a 73 y.o. male with past medical conditions including hypertension, hyperlipidemia, PAD, gout, type 2 diabetes, paroxysmal atrial fibrillation on Eliquis , and end-stage renal disease on peritoneal dialysis. He presents to the ED due to abnormal labs. He has been admitted for Hyperkalemia [E87.5] ESRD on peritoneal dialysis [facility]) [N18.6, Z99.2]  Patient is known to our practice and is followed by [facility] for PD by Dr Douglas. He admits to missing 3 treatments last week.   Update: Patient seen laying in bed Breakfast tray at bedside Room air Denies shortness of breath Tolerated dialysis overnight  UF -142   Objective:  Vital signs in last 24 hours:  Temp:  [97.7 F (36.5 C)-98.5 F (36.9 C)] 98.5 F (36.9 C) (08/28 0742) Pulse Rate:  [66-90] 70 (08/28 0742) Resp:  [18-22] 22 (08/28 0742) BP: (128-157)/(78-92) 157/87 (08/28 0742) SpO2:  [96 %-98 %] 97 % (08/28 0742) Weight:  [58.3 kg] 58.3 kg (08/28 0500)  Weight change: -0.668 kg Filed Weights   09/20/23 1716 09/22/23 0500  Weight: 59 kg 58.3 kg    Intake/Output: I/O last 3 completed shifts: In: 120 [P.O.:120] Out: 500 [Urine:500]   Intake/Output this shift:  Total I/O In: -142  Out: -   Physical Exam: General: NAD  Head: Normocephalic, atraumatic. Moist oral mucosal membranes  Eyes: Anicteric  Neck: Supple  Lungs:  Clear to auscultation  Heart: Regular rate and rhythm  Abdomen:  Soft, nontender  Extremities:  No peripheral edema.  Neurologic: Awake, alert, conversant  Skin: Warm,dry, no rash  Access: PD catheter    Basic Metabolic Panel: Recent Labs  Lab 09/20/23 1718 09/21/23 0545 09/22/23 0608  NA 142 140 141  K 5.9* 5.9* 4.8  CL 113* 111 109  CO2 19* 19* 22  GLUCOSE 89 74 98  BUN 63* 64* 59*  CREATININE 8.29* 8.35* 8.40*  CALCIUM  8.0* 7.5* 7.8*  MG  --   --  1.9  PHOS  --   --  6.0*    Liver  Function Tests: Recent Labs  Lab 09/20/23 1718  AST 22  ALT 19  ALKPHOS 147*  BILITOT 0.6  PROT 6.3*  ALBUMIN 2.7*   No results for input(s): LIPASE, AMYLASE in the last 168 hours. No results for input(s): AMMONIA in the last 168 hours.  CBC: Recent Labs  Lab 09/20/23 1802 09/21/23 0545 09/22/23 0608  WBC 6.3 6.8 6.8  NEUTROABS 4.5  --   --   HGB 8.1* 8.1* 8.6*  HCT 26.5* 25.6* 27.1*  MCV 101.1* 98.8 97.1  PLT 242 210 223    Cardiac Enzymes: No results for input(s): CKTOTAL, CKMB, CKMBINDEX, TROPONINI in the last 168 hours.  BNP: Invalid input(s): POCBNP  CBG: Recent Labs  Lab 09/20/23 2144  GLUCAP 161*    Microbiology: Results for orders placed or performed in visit on 08/16/23  Microscopic Examination     Status: Abnormal   Collection Time: 08/16/23  2:11 PM   Urine  Result Value Ref Range Status   WBC, UA 11-30 (A) 0 - 5 /hpf Final   RBC, Urine 3-10 (A) 0 - 2 /hpf Final   Epithelial Cells (non renal) 0-10 0 - 10 /hpf Final   Bacteria, UA Few None seen/Few Final    Coagulation Studies: Recent Labs    09/20/23 1718  LABPROT 13.8  INR 1.0    Urinalysis: No results for input(s): COLORURINE, LABSPEC,  PHURINE, GLUCOSEU, HGBUR, BILIRUBINUR, KETONESUR, PROTEINUR, UROBILINOGEN, NITRITE, LEUKOCYTESUR in the last 72 hours.  Invalid input(s): APPERANCEUR    Imaging: No results found.   Medications:    dialysis solution 2.5% low-MG/low-CA      apixaban   2.5 mg Oral BID   aspirin  EC  81 mg Oral Daily   calcitRIOL   0.25 mcg Oral Daily   [START ON 09/23/2023] vitamin B-12  1,000 mcg Oral Daily   gentamicin  cream  1 Application Topical Daily   isosorbide  mononitrate  30 mg Oral Daily   metoprolol  succinate  50 mg Oral BH-q7a   pantoprazole   40 mg Oral Daily   rosuvastatin   10 mg Oral Daily   sevelamer  carbonate  800 mg Oral TID WC   sodium bicarbonate   1,300 mg Oral BID   acetaminophen  **OR**  acetaminophen , gabapentin , HYDROcodone -acetaminophen , magnesium  hydroxide, methocarbamol , nitroGLYCERIN , ondansetron  **OR** ondansetron  (ZOFRAN ) IV, traZODone   Assessment/ Plan:  [name] is a 73 y.o.  male with past medical conditions including hypertension, hyperlipidemia, PAD, gout, type 2 diabetes, paroxysmal atrial fibrillation on Eliquis , and end-stage renal disease on peritoneal dialysis. He presents to the ED due to abnormal labs. He has been admitted for Hyperkalemia [E87.5] ESRD on peritoneal dialysis (HCC) [N18.6, Z99.2]   Hyperkalemia with end stage renal disease on peritoneal dialysis. Last treatment the night before ED arrival. Does admit to missing 3 treatments in the last week. Potassium 5.9 on ED arrival.  Potassium  4.8 today. Received dialysis last night, no concerns during treatment. Patient cleared to discharge from renal stance. Will defer to primary team.   2. Anemia of chronic kidney disease Lab Results  Component Value Date   HGB 8.6 (L) 09/22/2023    Patient receives Mircera outpatient. Hgb slightly decreased. Will monitor for now  3. Hypertension with chronic kidney disease. Receiving metoprolol  and isosorbide .  4. Diabetes mellitus type II with chronic kidney disease/renal manifestations: noninsulin dependent.  Most recent hemoglobin A1c is 5.4 on 04/18/22.       LOS: 1 Mitsugi Schrader 8/28/202511:38 AM

## 2023-09-22 NOTE — Plan of Care (Signed)

## 2023-09-22 NOTE — Discharge Summary (Signed)
 [facility] Discharge Summary   Patient: Casey French FMW:[id number]  PCP: Valora Agent, MD  Date of admission: 09/20/2023   Date of discharge:  09/22/2023     Discharge Diagnoses:  Principal Problem:   Hyperkalemia Active Problems:   GERD without esophagitis   Essential hypertension   Dyslipidemia   Peripheral neuropathy   Paroxysmal atrial fibrillation (HCC)   Admitted From: Home Disposition:  Home   Recommendations for Outpatient Follow-up:  F/u with PCP in 1 wk, monitor BP and titrate medications accordingly Follow-up with nephrology, check BMP in 1 week, continue peritoneal dialysis.  Counseling was done to be compliant with peritoneal dialysis. Repeat B12 and folate level after 3 months, and vitamin D  level after 3 months Follow up LABS/TEST:  As above   Follow-up Information     Valora Agent, MD.   Specialty: Family Medicine Contact information: [address] [facility] [place] [address] [phone]         [facility] Emergency Department at [facility].   Specialty: Emergency Medicine Why: If symptoms worsen Contact information: [address] [place]  [address] [phone]               Diet recommendation: Renal diet  Activity: The patient is advised to gradually reintroduce usual activities, as tolerated  Discharge Condition: stable  Code Status: DNR   History of present illness: As per the H and P dictated on admission. Hospital Course:  Casey French is a 73 y.o. Caucasian male with medical history significant for coronary artery disease, diverticulitis, depression, ESRD on PD, paroxysmal atrial fibrillation on Eliquis , GERD, dyslipidemia, hypertension, CVA and gout, who presented to the emergency room with Acute onset of hyperkalemia.  He was told by his nephrologist to come to the ED based on outpatient labs.  The patient denies any chest pain or palpitations.  No cough or  wheezing or dyspnea.  No nausea or vomiting or abdominal pain.  No diarrhea or melena or bright red bleeding per rectum.  No dysuria, urinary frequency or urgency or flank pain.   ED Course: Upon presentation to the ER, vital signs were within normal.  Labs revealed hyperkalemia of 5.9 and chloride of 113, CO2 19 and BUN 63 with creatinine 8.29 and calcium  8.  Alk phos was 147 and lipase 2.7 and total protein 6.3.  CBC showed hemoglobin 8.1 hematocrit 26.1 close to baseline with macrocytosis   EKG as reviewed by me :  EKG showed normal sinus rhythm with a rate of 71 with minimal voltage criteria for LVH and peaked T waves. Imaging: None.   The patient will given 10 g of p.o. Lokelma , 10 units of IV NovoLog  and 2 A of D50, an amp of calcium  gluconate to 1 g IV he will be admitted to a progressive unit observation bed for further evaluation and management.     Assessment and Plan:   # Hyperkalemia: Due to ESRD on PD.  Patient missed 3 PD treatments at home.  Patient was sent to ED due to hyperkalemia.  Patient received temporizing measures as above but potassium did not improve.  Nephrology was consulted and patient was restarted on peritoneal dialysis.  Repeat potassium level within normal range.  Hyperkalemia resolved.  Patient was cleared by nephrology and follow-up as an outpatient.  Complaints with peritoneal dialysis was enforced.  Patient verbalized understanding.  Continued home meds.  # Hyperphosphatemia due to ESRD: Started Renvela   # Essential hypertension: continued  amlodipine  10 mg, Toprol -XL 50 mg, started Imdur  30 mg daily if systolic BP greater than 140 mmHg.  Discontinued losartan  due to hyperkalemia.  Patient with advised to monitor BP at home and follow with PCP and nephrology to titrate medication accordingly.   # Paroxysmal atrial fibrillation: continue Eliquis  and Toprol -XL. # Peripheral neuropathy: continue Neurontin . # Dyslipidemia: On statin therapy. # GERD without  esophagitis: continue PPI therapy.  # Folic acid  deficiency, folic acid  level 5.4.  Started folic acid  1 mg p.o. daily.  Continue for treatment and then follow-up with PCP to repeat folic acid  level.  # Vitamin B12 level 243 at lower end, goal >400.  Vitamin B12 1000 mcg IM injection x 1 dose given followed by vitamin B12 oral supplement.  Follow-up with PCP repeat B12 level after 3 to 6 months.  Body mass index is 18.98 kg/m.  Nutrition Interventions:  - Patient was instructed, not to drive, operate heavy machinery, perform activities at heights, swimming or participation in water  activities or provide baby sitting services while on Pain, Sleep and Anxiety Medications; until his outpatient Physician has advised to do so again.  - Also recommended to not to take more than prescribed Pain, Sleep and Anxiety Medications.  Patient was ambulatory without any assistance. Soon after discharge paperwork patient had an episode of vomiting, received Zofran .  Patient stated few hours in the hospital and then gradually he started feeling better.  Patient was given an option to stay another night if he is not feeling well but patient was back to his baseline and he decided to go home.  Patient was discharged in stable condition.  On the day of the discharge the patient's vitals were stable, and no other acute medical condition were reported by patient. the patient was felt safe to be discharge at Home.  Consultants: Nephrology Procedures: Peritoneal dialysis  Discharge Exam: General: Appear in no distress, no Rash; Oral Mucosa Clear, moist. Cardiovascular: S1 and S2 Present, no Murmur, Respiratory: normal respiratory effort, Bilateral Air entry present and no Crackles, no wheezes Abdomen: Bowel Sound present, Soft and no tenderness, no hernia Extremities: no Pedal edema, no calf tenderness Neurology: alert and oriented to time, place, and person affect appropriate.  Filed Weights   09/20/23 1716  09/22/23 0500  Weight: 59 kg 58.3 kg   Vitals:   09/22/23 1216 09/22/23 1233  BP: (!) 174/88 133/83  Pulse: 75   Resp: (!) 22   Temp: 97.8 F (36.6 C)   SpO2: 98%     DISCHARGE MEDICATION: Allergies as of 09/22/2023       Reactions   Morphine  Anxiety   agitation        Medication List     STOP taking these medications    losartan  100 MG tablet Commonly known as: COZAAR    meclizine  25 MG tablet Commonly known as: ANTIVERT    methocarbamol  500 MG tablet Commonly known as: ROBAXIN    ondansetron  4 MG disintegrating tablet Commonly known as: ZOFRAN -ODT   psyllium 58.6 % packet Commonly known as: METAMUCIL   rosuvastatin  40 MG tablet Commonly known as: CRESTOR    sodium polystyrene powder Commonly known as: KAYEXALATE   Vitamin D  (Ergocalciferol ) 1.25 MG (50000 UNIT) Caps capsule Commonly known as: DRISDOL       TAKE these medications    acetaminophen  325 MG tablet Commonly known as: TYLENOL  Take 2 tablets (650 mg total) by mouth every 6 (six) hours as needed for mild pain (or Fever >/= 101).  amLODipine  10 MG tablet Commonly known as: NORVASC  Take 10 mg by mouth daily. What changed: Another medication with the same name was removed. Continue taking this medication, and follow the directions you see here.   apixaban  2.5 MG Tabs tablet Commonly known as: ELIQUIS  Take 2.5 mg by mouth 2 (two) times daily.   aspirin  EC 81 MG tablet Take 81 mg by mouth daily.   calcitRIOL  0.25 MCG capsule Commonly known as: ROCALTROL  Take 1 capsule (0.25 mcg total) by mouth daily.   cyanocobalamin  1000 MCG tablet Take 1 tablet (1,000 mcg total) by mouth daily. Start taking on: September 23, 2023   folic acid  1 MG tablet Commonly known as: FOLVITE  Take 1 tablet (1 mg total) by mouth daily. Take 1 tablet daily   gabapentin  300 MG capsule Commonly known as: NEURONTIN  Take 300 mg by mouth at bedtime.   HYDROcodone -acetaminophen  5-325 MG tablet Commonly known as:  NORCO/VICODIN Take 1 tablet by mouth every 6 (six) hours as needed for moderate pain (pain score 4-6).   hydrocortisone  2.5 % rectal cream Commonly known as: ANUSOL -HC Place 1 application rectally 2 (two) times daily as needed for hemorrhoids or anal itching.   isosorbide  mononitrate 30 MG 24 hr tablet Commonly known as: IMDUR  Take 1 tablet (30 mg total) by mouth daily as needed (Systolic BP greater than 140).   Lokelma  10 g Pack packet Generic drug: sodium zirconium cyclosilicate  Take 10 g by mouth daily for 3 days.   metoprolol  succinate 100 MG 24 hr tablet Commonly known as: TOPROL -XL Take 0.5 tablets (50 mg total) by mouth every morning. Take with or immediately following a meal.   nitroGLYCERIN  0.4 MG SL tablet Commonly known as: NITROSTAT  Place 0.4 mg under the tongue every 5 (five) minutes as needed for chest pain.   ondansetron  4 MG tablet Commonly known as: ZOFRAN  Take 4 mg by mouth every 8 (eight) hours as needed for nausea or vomiting.   pantoprazole  40 MG tablet Commonly known as: Protonix  Take 1 tablet (40 mg total) by mouth daily.   sevelamer  carbonate 800 MG tablet Commonly known as: RENVELA  Take 1 tablet (800 mg total) by mouth 3 (three) times daily with meals.   sodium bicarbonate  650 MG tablet Take 2 tablets (1,300 mg total) by mouth 2 (two) times daily.               Discharge Care Instructions  (From admission, onward)           Start     Ordered   09/22/23 0000  Discharge wound care:       Comments: As above   09/22/23 1306           Allergies  Allergen Reactions   Morphine  Anxiety    agitation    Discharge Instructions     Call MD for:  difficulty breathing, headache or visual disturbances   Complete by: As directed    Call MD for:  extreme fatigue   Complete by: As directed    Call MD for:  persistant dizziness or light-headedness   Complete by: As directed    Call MD for:  persistant nausea and vomiting   Complete  by: As directed    Call MD for:  severe uncontrolled pain   Complete by: As directed    Call MD for:  temperature >100.4   Complete by: As directed    Diet - low sodium heart healthy   Complete by: As directed  Discharge instructions   Complete by: As directed    F/u with PCP in 1 wk, monitor BP and titrate medications accordingly Follow-up with nephrology, check BMP in 1 week, continue peritoneal dialysis.  Counseling was done to be compliant with peritoneal dialysis. Repeat B12 and folate level after 3 months, and vitamin D  level after 3 months   Discharge wound care:   Complete by: As directed    As above   Increase activity slowly   Complete by: As directed        The results of significant diagnostics from this hospitalization (including imaging, microbiology, ancillary and laboratory) are listed below for reference.    Significant Diagnostic Studies: DG Chest 2 View Result Date: 09/02/2023 EXAM: 2 VIEW(S) XRAY OF THE CHEST 09/02/2023 11:01:00 AM COMPARISON: 04/22/2023 CLINICAL HISTORY: sob. woke up with SOB. Pt reports some chest tightness. Pt hypertensive in triage FINDINGS: LUNGS AND PLEURA: Some increasing coarse opacities at the right lung base, with new blunting of the right lateral Costophrenic Angle, suggesting a small effusion. HEART AND MEDIASTINUM: Coronary and aortic calcification. BONES AND SOFT TISSUES: No acute osseous abnormality. IMPRESSION: 1. Increasing coarse opacities at the right lung base with new blunting of the right lateral costophrenic angle, suggesting a small effusion. Electronically signed by: Dayne Hassell MD 09/02/2023 11:28 AM EDT RP Workstation: [id number]    Microbiology: No results found for this or any previous visit (from the past 240 hours).   Labs: CBC: Recent Labs  Lab 09/20/23 1802 09/21/23 0545 09/22/23 0608  WBC 6.3 6.8 6.8  NEUTROABS 4.5  --   --   HGB 8.1* 8.1* 8.6*  HCT 26.5* 25.6* 27.1*  MCV 101.1* 98.8 97.1  PLT 242 210  223   Basic Metabolic Panel: Recent Labs  Lab 09/20/23 1718 09/21/23 0545 09/22/23 0608  NA 142 140 141  K 5.9* 5.9* 4.8  CL 113* 111 109  CO2 19* 19* 22  GLUCOSE 89 74 98  BUN 63* 64* 59*  CREATININE 8.29* 8.35* 8.40*  CALCIUM  8.0* 7.5* 7.8*  MG  --   --  1.9  PHOS  --   --  6.0*   Liver Function Tests: Recent Labs  Lab 09/20/23 1718  AST 22  ALT 19  ALKPHOS 147*  BILITOT 0.6  PROT 6.3*  ALBUMIN 2.7*   No results for input(s): LIPASE, AMYLASE in the last 168 hours. No results for input(s): AMMONIA in the last 168 hours. Cardiac Enzymes: No results for input(s): CKTOTAL, CKMB, CKMBINDEX, TROPONINI in the last 168 hours. BNP (last 3 results) Recent Labs    04/22/23 1206  BNP 168.8*   CBG: Recent Labs  Lab 09/20/23 2144  GLUCAP 161*    Time spent: 35 minutes  Signed:  Elvan Sor  [facility] 09/22/2023 1:07 PM

## 2023-09-22 NOTE — Progress Notes (Signed)
   09/22/23 9294  Peritoneal Catheter Left lower abdomen Continuous ambulatory  Placement Date/Time: 08/13/20 1246   Procedural Verification: Medical records & consent reviewed  Person Inserting LDA: Dr Derrill  Catheter Location: Left lower abdomen  Catheter Conversion: Extension added  Serial / Lot #: [id number]  Dialysis Type: C...  Site Assessment Clean, Dry, Intact;Clean;Dry;Intact  Dressing Occlusive  Dressing Status Clean, Dry, Intact;Clean  Dressing Intervention Assessed, no intervention needed  Completion  Treatment Status Complete  Initial Drain Volume 96  Average Dwell Time-Hour(s) 1  Average Dwell Time-Min(s) 36  Average Drain Time 18  Total Therapy Volume 7500  Total Therapy Time-Hour(s) 6  Total Therapy Time-Min(s) 8  Weight after Drain 116 lb 6.5 oz (52.8 kg)  Effluent Appearance Clear  Fluid Balance - CCPD  Total UF (- value on cycler, pt gain) -142 mL  Procedure Comments  Tolerated treatment well? Yes  Education / Care Plan  Dialysis Education Provided Yes  Hand-off documentation  Hand-off Received Received from shift RN/LPN  Report received from (Full Name) Randine, RN   PD post treatment note  PD treatment completed. Patient tolerated treatment well. PD effluent is clear. No specimen collected.  PD exit site clean, dry and intact. Patient is awake, oriented and in no acute distress.  Report given to bedside nurse.   Post treatment VS:   Total UF removed:     Post treatment weight: 52.8kg

## 2023-09-25 DIAGNOSIS — Z992 Dependence on renal dialysis: Secondary | ICD-10-CM | POA: Diagnosis not present

## 2023-09-25 DIAGNOSIS — N186 End stage renal disease: Secondary | ICD-10-CM | POA: Diagnosis not present

## 2023-09-26 DIAGNOSIS — N186 End stage renal disease: Secondary | ICD-10-CM | POA: Diagnosis not present

## 2023-09-26 DIAGNOSIS — Z992 Dependence on renal dialysis: Secondary | ICD-10-CM | POA: Diagnosis not present

## 2023-09-27 DIAGNOSIS — Z992 Dependence on renal dialysis: Secondary | ICD-10-CM | POA: Diagnosis not present

## 2023-09-27 DIAGNOSIS — N186 End stage renal disease: Secondary | ICD-10-CM | POA: Diagnosis not present

## 2023-09-28 DIAGNOSIS — N186 End stage renal disease: Secondary | ICD-10-CM | POA: Diagnosis not present

## 2023-09-28 DIAGNOSIS — Z992 Dependence on renal dialysis: Secondary | ICD-10-CM | POA: Diagnosis not present

## 2023-09-29 DIAGNOSIS — N186 End stage renal disease: Secondary | ICD-10-CM | POA: Diagnosis not present

## 2023-09-29 DIAGNOSIS — Z992 Dependence on renal dialysis: Secondary | ICD-10-CM | POA: Diagnosis not present

## 2023-09-30 DIAGNOSIS — Z992 Dependence on renal dialysis: Secondary | ICD-10-CM | POA: Diagnosis not present

## 2023-09-30 DIAGNOSIS — N186 End stage renal disease: Secondary | ICD-10-CM | POA: Diagnosis not present

## 2023-09-30 DIAGNOSIS — E878 Other disorders of electrolyte and fluid balance, not elsewhere classified: Secondary | ICD-10-CM | POA: Diagnosis not present

## 2023-10-01 DIAGNOSIS — N186 End stage renal disease: Secondary | ICD-10-CM | POA: Diagnosis not present

## 2023-10-01 DIAGNOSIS — Z992 Dependence on renal dialysis: Secondary | ICD-10-CM | POA: Diagnosis not present

## 2023-10-02 DIAGNOSIS — N186 End stage renal disease: Secondary | ICD-10-CM | POA: Diagnosis not present

## 2023-10-02 DIAGNOSIS — Z992 Dependence on renal dialysis: Secondary | ICD-10-CM | POA: Diagnosis not present

## 2023-10-03 DIAGNOSIS — N186 End stage renal disease: Secondary | ICD-10-CM | POA: Diagnosis not present

## 2023-10-03 DIAGNOSIS — Z992 Dependence on renal dialysis: Secondary | ICD-10-CM | POA: Diagnosis not present

## 2023-10-04 ENCOUNTER — Encounter: Payer: Self-pay | Admitting: Urology

## 2023-10-04 ENCOUNTER — Ambulatory Visit (INDEPENDENT_AMBULATORY_CARE_PROVIDER_SITE_OTHER): Admitting: Urology

## 2023-10-04 VITALS — BP 172/87 | HR 92 | Ht 69.0 in | Wt 130.0 lb

## 2023-10-04 DIAGNOSIS — Z992 Dependence on renal dialysis: Secondary | ICD-10-CM | POA: Diagnosis not present

## 2023-10-04 DIAGNOSIS — C679 Malignant neoplasm of bladder, unspecified: Secondary | ICD-10-CM

## 2023-10-04 DIAGNOSIS — D494 Neoplasm of unspecified behavior of bladder: Secondary | ICD-10-CM | POA: Diagnosis not present

## 2023-10-04 DIAGNOSIS — N186 End stage renal disease: Secondary | ICD-10-CM | POA: Diagnosis not present

## 2023-10-04 LAB — URINALYSIS, COMPLETE
Bilirubin, UA: NEGATIVE
Ketones, UA: NEGATIVE
Leukocytes,UA: NEGATIVE
Nitrite, UA: NEGATIVE
Specific Gravity, UA: 1.02 (ref 1.005–1.030)
Urobilinogen, Ur: 0.2 mg/dL (ref 0.2–1.0)
pH, UA: 8 — ABNORMAL HIGH (ref 5.0–7.5)

## 2023-10-04 LAB — MICROSCOPIC EXAMINATION

## 2023-10-04 NOTE — Progress Notes (Signed)
   10/04/23  CC:  Chief Complaint  Patient presents with   Cysto   Urologic history:  1.  T1 high-grade urothelial carcinoma bladder Cystoscopy 02/16/2023 performed for microhematuria with papillary tumor mid posterior wall TURBT 04/05/2023: 2 cm mid posterior wall papillary tumor with a 1 cm adjacent tumor just superior.  Path: High-grade urothelial carcinoma with lamina propria invasion Induction BCG completed 08/16/2023   HPI: No complaints since last visit.  Tolerated BCG without problems.  Denies gross hematuria.  Blood pressure (!) 172/87, pulse 92, height 5' 9 (1.753 m), weight 130 lb (59 kg).   Cystoscopy Procedure Note  Patient identification was confirmed, informed consent was obtained, and patient was prepped using Betadine  solution.  Lidocaine  jelly was administered per urethral meatus.     Pre-Procedure: - Inspection reveals a normal caliber urethral meatus.  Procedure: The flexible cystoscope was introduced without difficulty - No urethral strictures/lesions are present. - Mild lateral lobe enlargement prostate  -  bladder neck - Bilateral ureteral orifices identified - Bladder mucosa  reveals no ulcers, tumors, or lesions.  Scar posterior wall without erythema or lesion - No bladder stones - Mild trabeculation  Retroflexion shows no abnormalities   Post-Procedure: - Patient tolerated the procedure well  Assessment/ Plan: No evidence of recurrent bladder tumor Maintenance BCG was discussed and he is interested in pursuing Maintenance BCG x 3 late October 2025 Surveillance cystoscopy December 2025    Glendia JAYSON Barba, MD

## 2023-10-05 DIAGNOSIS — Z992 Dependence on renal dialysis: Secondary | ICD-10-CM | POA: Diagnosis not present

## 2023-10-05 DIAGNOSIS — N186 End stage renal disease: Secondary | ICD-10-CM | POA: Diagnosis not present

## 2023-10-06 DIAGNOSIS — Z992 Dependence on renal dialysis: Secondary | ICD-10-CM | POA: Diagnosis not present

## 2023-10-06 DIAGNOSIS — N186 End stage renal disease: Secondary | ICD-10-CM | POA: Diagnosis not present

## 2023-10-07 DIAGNOSIS — Z992 Dependence on renal dialysis: Secondary | ICD-10-CM | POA: Diagnosis not present

## 2023-10-07 DIAGNOSIS — N186 End stage renal disease: Secondary | ICD-10-CM | POA: Diagnosis not present

## 2023-10-08 DIAGNOSIS — N186 End stage renal disease: Secondary | ICD-10-CM | POA: Diagnosis not present

## 2023-10-08 DIAGNOSIS — Z992 Dependence on renal dialysis: Secondary | ICD-10-CM | POA: Diagnosis not present

## 2023-10-09 DIAGNOSIS — N186 End stage renal disease: Secondary | ICD-10-CM | POA: Diagnosis not present

## 2023-10-09 DIAGNOSIS — Z992 Dependence on renal dialysis: Secondary | ICD-10-CM | POA: Diagnosis not present

## 2023-10-10 DIAGNOSIS — N186 End stage renal disease: Secondary | ICD-10-CM | POA: Diagnosis not present

## 2023-10-10 DIAGNOSIS — Z992 Dependence on renal dialysis: Secondary | ICD-10-CM | POA: Diagnosis not present

## 2023-10-11 DIAGNOSIS — N186 End stage renal disease: Secondary | ICD-10-CM | POA: Diagnosis not present

## 2023-10-11 DIAGNOSIS — Z992 Dependence on renal dialysis: Secondary | ICD-10-CM | POA: Diagnosis not present

## 2023-10-12 DIAGNOSIS — E875 Hyperkalemia: Secondary | ICD-10-CM | POA: Diagnosis not present

## 2023-10-12 DIAGNOSIS — I120 Hypertensive chronic kidney disease with stage 5 chronic kidney disease or end stage renal disease: Secondary | ICD-10-CM | POA: Diagnosis not present

## 2023-10-12 DIAGNOSIS — N186 End stage renal disease: Secondary | ICD-10-CM | POA: Diagnosis not present

## 2023-10-12 DIAGNOSIS — Z992 Dependence on renal dialysis: Secondary | ICD-10-CM | POA: Diagnosis not present

## 2023-10-13 DIAGNOSIS — Z992 Dependence on renal dialysis: Secondary | ICD-10-CM | POA: Diagnosis not present

## 2023-10-13 DIAGNOSIS — N186 End stage renal disease: Secondary | ICD-10-CM | POA: Diagnosis not present

## 2023-10-14 DIAGNOSIS — Z992 Dependence on renal dialysis: Secondary | ICD-10-CM | POA: Diagnosis not present

## 2023-10-14 DIAGNOSIS — N186 End stage renal disease: Secondary | ICD-10-CM | POA: Diagnosis not present

## 2023-10-15 DIAGNOSIS — N186 End stage renal disease: Secondary | ICD-10-CM | POA: Diagnosis not present

## 2023-10-15 DIAGNOSIS — Z992 Dependence on renal dialysis: Secondary | ICD-10-CM | POA: Diagnosis not present

## 2023-10-16 DIAGNOSIS — Z992 Dependence on renal dialysis: Secondary | ICD-10-CM | POA: Diagnosis not present

## 2023-10-16 DIAGNOSIS — N186 End stage renal disease: Secondary | ICD-10-CM | POA: Diagnosis not present

## 2023-10-18 DIAGNOSIS — N186 End stage renal disease: Secondary | ICD-10-CM | POA: Diagnosis not present

## 2023-10-18 DIAGNOSIS — Z992 Dependence on renal dialysis: Secondary | ICD-10-CM | POA: Diagnosis not present

## 2023-10-19 DIAGNOSIS — Z992 Dependence on renal dialysis: Secondary | ICD-10-CM | POA: Diagnosis not present

## 2023-10-19 DIAGNOSIS — N186 End stage renal disease: Secondary | ICD-10-CM | POA: Diagnosis not present

## 2023-10-20 DIAGNOSIS — N186 End stage renal disease: Secondary | ICD-10-CM | POA: Diagnosis not present

## 2023-10-20 DIAGNOSIS — Z992 Dependence on renal dialysis: Secondary | ICD-10-CM | POA: Diagnosis not present

## 2023-10-21 DIAGNOSIS — N186 End stage renal disease: Secondary | ICD-10-CM | POA: Diagnosis not present

## 2023-10-21 DIAGNOSIS — Z992 Dependence on renal dialysis: Secondary | ICD-10-CM | POA: Diagnosis not present

## 2023-10-22 DIAGNOSIS — Z992 Dependence on renal dialysis: Secondary | ICD-10-CM | POA: Diagnosis not present

## 2023-10-22 DIAGNOSIS — N186 End stage renal disease: Secondary | ICD-10-CM | POA: Diagnosis not present

## 2023-10-23 DIAGNOSIS — N186 End stage renal disease: Secondary | ICD-10-CM | POA: Diagnosis not present

## 2023-10-23 DIAGNOSIS — Z992 Dependence on renal dialysis: Secondary | ICD-10-CM | POA: Diagnosis not present

## 2023-10-24 DIAGNOSIS — N186 End stage renal disease: Secondary | ICD-10-CM | POA: Diagnosis not present

## 2023-10-24 DIAGNOSIS — Z992 Dependence on renal dialysis: Secondary | ICD-10-CM | POA: Diagnosis not present

## 2023-10-25 DIAGNOSIS — N186 End stage renal disease: Secondary | ICD-10-CM | POA: Diagnosis not present

## 2023-10-25 DIAGNOSIS — Z992 Dependence on renal dialysis: Secondary | ICD-10-CM | POA: Diagnosis not present

## 2023-10-26 DIAGNOSIS — Z992 Dependence on renal dialysis: Secondary | ICD-10-CM | POA: Diagnosis not present

## 2023-10-27 DIAGNOSIS — Z8551 Personal history of malignant neoplasm of bladder: Secondary | ICD-10-CM | POA: Diagnosis not present

## 2023-10-27 DIAGNOSIS — R202 Paresthesia of skin: Secondary | ICD-10-CM | POA: Diagnosis not present

## 2023-10-27 DIAGNOSIS — E1122 Type 2 diabetes mellitus with diabetic chronic kidney disease: Secondary | ICD-10-CM | POA: Diagnosis not present

## 2023-10-27 DIAGNOSIS — N2581 Secondary hyperparathyroidism of renal origin: Secondary | ICD-10-CM | POA: Diagnosis not present

## 2023-10-27 DIAGNOSIS — D631 Anemia in chronic kidney disease: Secondary | ICD-10-CM | POA: Diagnosis not present

## 2023-10-27 DIAGNOSIS — M109 Gout, unspecified: Secondary | ICD-10-CM | POA: Diagnosis not present

## 2023-10-27 DIAGNOSIS — N186 End stage renal disease: Secondary | ICD-10-CM | POA: Diagnosis not present

## 2023-10-27 DIAGNOSIS — F17211 Nicotine dependence, cigarettes, in remission: Secondary | ICD-10-CM | POA: Diagnosis not present

## 2023-10-27 DIAGNOSIS — D6869 Other thrombophilia: Secondary | ICD-10-CM | POA: Diagnosis not present

## 2023-10-27 DIAGNOSIS — Z8673 Personal history of transient ischemic attack (TIA), and cerebral infarction without residual deficits: Secondary | ICD-10-CM | POA: Diagnosis not present

## 2023-10-27 DIAGNOSIS — I132 Hypertensive heart and chronic kidney disease with heart failure and with stage 5 chronic kidney disease, or end stage renal disease: Secondary | ICD-10-CM | POA: Diagnosis not present

## 2023-10-27 DIAGNOSIS — I6529 Occlusion and stenosis of unspecified carotid artery: Secondary | ICD-10-CM | POA: Diagnosis not present

## 2023-10-27 DIAGNOSIS — K219 Gastro-esophageal reflux disease without esophagitis: Secondary | ICD-10-CM | POA: Diagnosis not present

## 2023-10-27 DIAGNOSIS — D8481 Immunodeficiency due to conditions classified elsewhere: Secondary | ICD-10-CM | POA: Diagnosis not present

## 2023-10-27 DIAGNOSIS — I480 Paroxysmal atrial fibrillation: Secondary | ICD-10-CM | POA: Diagnosis not present

## 2023-10-27 DIAGNOSIS — I5032 Chronic diastolic (congestive) heart failure: Secondary | ICD-10-CM | POA: Diagnosis not present

## 2023-10-27 DIAGNOSIS — Z992 Dependence on renal dialysis: Secondary | ICD-10-CM | POA: Diagnosis not present

## 2023-10-28 DIAGNOSIS — N186 End stage renal disease: Secondary | ICD-10-CM | POA: Diagnosis not present

## 2023-10-29 DIAGNOSIS — N186 End stage renal disease: Secondary | ICD-10-CM | POA: Diagnosis not present

## 2023-11-05 DIAGNOSIS — N186 End stage renal disease: Secondary | ICD-10-CM | POA: Diagnosis not present

## 2023-11-07 DIAGNOSIS — N186 End stage renal disease: Secondary | ICD-10-CM | POA: Diagnosis not present

## 2023-11-16 ENCOUNTER — Other Ambulatory Visit: Payer: Self-pay

## 2023-11-16 DIAGNOSIS — N400 Enlarged prostate without lower urinary tract symptoms: Secondary | ICD-10-CM

## 2023-11-16 DIAGNOSIS — N186 End stage renal disease: Secondary | ICD-10-CM | POA: Diagnosis not present

## 2023-11-17 ENCOUNTER — Other Ambulatory Visit: Payer: Self-pay

## 2023-11-18 ENCOUNTER — Ambulatory Visit: Payer: Self-pay | Admitting: Urology

## 2023-11-18 ENCOUNTER — Ambulatory Visit: Admitting: Urology

## 2023-11-18 DIAGNOSIS — N186 End stage renal disease: Secondary | ICD-10-CM | POA: Diagnosis not present

## 2023-11-18 DIAGNOSIS — Z992 Dependence on renal dialysis: Secondary | ICD-10-CM | POA: Diagnosis not present

## 2023-11-22 DIAGNOSIS — Z992 Dependence on renal dialysis: Secondary | ICD-10-CM | POA: Diagnosis not present

## 2023-11-22 DIAGNOSIS — N186 End stage renal disease: Secondary | ICD-10-CM | POA: Diagnosis not present

## 2023-11-24 ENCOUNTER — Other Ambulatory Visit

## 2023-11-24 DIAGNOSIS — N400 Enlarged prostate without lower urinary tract symptoms: Secondary | ICD-10-CM | POA: Diagnosis not present

## 2023-11-25 DIAGNOSIS — N186 End stage renal disease: Secondary | ICD-10-CM | POA: Diagnosis not present

## 2023-11-25 DIAGNOSIS — Z992 Dependence on renal dialysis: Secondary | ICD-10-CM | POA: Diagnosis not present

## 2023-11-25 LAB — PSA: Prostate Specific Ag, Serum: 4 ng/mL (ref 0.0–4.0)

## 2023-11-26 DIAGNOSIS — N186 End stage renal disease: Secondary | ICD-10-CM | POA: Diagnosis not present

## 2023-11-26 DIAGNOSIS — Z992 Dependence on renal dialysis: Secondary | ICD-10-CM | POA: Diagnosis not present

## 2023-11-28 DIAGNOSIS — Z992 Dependence on renal dialysis: Secondary | ICD-10-CM | POA: Diagnosis not present

## 2023-11-28 DIAGNOSIS — I2510 Atherosclerotic heart disease of native coronary artery without angina pectoris: Secondary | ICD-10-CM | POA: Diagnosis not present

## 2023-11-28 DIAGNOSIS — Z87891 Personal history of nicotine dependence: Secondary | ICD-10-CM | POA: Diagnosis not present

## 2023-11-28 DIAGNOSIS — E119 Type 2 diabetes mellitus without complications: Secondary | ICD-10-CM | POA: Diagnosis not present

## 2023-11-28 DIAGNOSIS — J069 Acute upper respiratory infection, unspecified: Secondary | ICD-10-CM | POA: Diagnosis not present

## 2023-11-28 DIAGNOSIS — I480 Paroxysmal atrial fibrillation: Secondary | ICD-10-CM | POA: Diagnosis not present

## 2023-11-28 DIAGNOSIS — I120 Hypertensive chronic kidney disease with stage 5 chronic kidney disease or end stage renal disease: Secondary | ICD-10-CM | POA: Diagnosis not present

## 2023-11-28 DIAGNOSIS — N186 End stage renal disease: Secondary | ICD-10-CM | POA: Diagnosis not present

## 2023-11-28 DIAGNOSIS — Z0000 Encounter for general adult medical examination without abnormal findings: Secondary | ICD-10-CM | POA: Diagnosis not present

## 2023-11-29 DIAGNOSIS — N186 End stage renal disease: Secondary | ICD-10-CM | POA: Diagnosis not present

## 2023-11-30 ENCOUNTER — Ambulatory Visit: Admitting: Urology

## 2023-11-30 DIAGNOSIS — Z992 Dependence on renal dialysis: Secondary | ICD-10-CM | POA: Diagnosis not present

## 2023-11-30 DIAGNOSIS — N186 End stage renal disease: Secondary | ICD-10-CM | POA: Diagnosis not present

## 2023-12-01 DIAGNOSIS — N186 End stage renal disease: Secondary | ICD-10-CM | POA: Diagnosis not present

## 2023-12-02 DIAGNOSIS — Z992 Dependence on renal dialysis: Secondary | ICD-10-CM | POA: Diagnosis not present

## 2023-12-02 DIAGNOSIS — N186 End stage renal disease: Secondary | ICD-10-CM | POA: Diagnosis not present

## 2023-12-06 DIAGNOSIS — Z794 Long term (current) use of insulin: Secondary | ICD-10-CM | POA: Diagnosis not present

## 2023-12-08 DIAGNOSIS — N186 End stage renal disease: Secondary | ICD-10-CM | POA: Diagnosis not present

## 2023-12-08 DIAGNOSIS — Z992 Dependence on renal dialysis: Secondary | ICD-10-CM | POA: Diagnosis not present

## 2023-12-10 DIAGNOSIS — N186 End stage renal disease: Secondary | ICD-10-CM | POA: Diagnosis not present

## 2023-12-11 DIAGNOSIS — N186 End stage renal disease: Secondary | ICD-10-CM | POA: Diagnosis not present

## 2023-12-12 DIAGNOSIS — Z992 Dependence on renal dialysis: Secondary | ICD-10-CM | POA: Diagnosis not present

## 2023-12-12 DIAGNOSIS — N186 End stage renal disease: Secondary | ICD-10-CM | POA: Diagnosis not present

## 2023-12-12 DIAGNOSIS — E878 Other disorders of electrolyte and fluid balance, not elsewhere classified: Secondary | ICD-10-CM | POA: Diagnosis not present

## 2023-12-15 DIAGNOSIS — N186 End stage renal disease: Secondary | ICD-10-CM | POA: Diagnosis not present

## 2023-12-16 DIAGNOSIS — Z992 Dependence on renal dialysis: Secondary | ICD-10-CM | POA: Diagnosis not present

## 2023-12-16 DIAGNOSIS — N186 End stage renal disease: Secondary | ICD-10-CM | POA: Diagnosis not present

## 2023-12-21 ENCOUNTER — Emergency Department

## 2023-12-21 ENCOUNTER — Other Ambulatory Visit: Payer: Self-pay

## 2023-12-21 ENCOUNTER — Inpatient Hospital Stay
Admission: EM | Admit: 2023-12-21 | Discharge: 2023-12-24 | DRG: 640 | Disposition: A | Discharge status: Home / Self Care | Source: Ambulatory Visit | Attending: Osteopathic Medicine | Admitting: Osteopathic Medicine

## 2023-12-21 DIAGNOSIS — Z955 Presence of coronary angioplasty implant and graft: Secondary | ICD-10-CM

## 2023-12-21 DIAGNOSIS — Z9889 Other specified postprocedural states: Secondary | ICD-10-CM | POA: Diagnosis not present

## 2023-12-21 DIAGNOSIS — I480 Paroxysmal atrial fibrillation: Secondary | ICD-10-CM | POA: Diagnosis not present

## 2023-12-21 DIAGNOSIS — R0600 Dyspnea, unspecified: Secondary | ICD-10-CM | POA: Diagnosis not present

## 2023-12-21 DIAGNOSIS — I10 Essential (primary) hypertension: Secondary | ICD-10-CM

## 2023-12-21 DIAGNOSIS — D631 Anemia in chronic kidney disease: Secondary | ICD-10-CM | POA: Diagnosis present

## 2023-12-21 DIAGNOSIS — Z992 Dependence on renal dialysis: Secondary | ICD-10-CM | POA: Diagnosis not present

## 2023-12-21 DIAGNOSIS — Z7982 Long term (current) use of aspirin: Secondary | ICD-10-CM

## 2023-12-21 DIAGNOSIS — I2510 Atherosclerotic heart disease of native coronary artery without angina pectoris: Secondary | ICD-10-CM | POA: Diagnosis not present

## 2023-12-21 DIAGNOSIS — Z59868 Other specified financial insecurity: Secondary | ICD-10-CM

## 2023-12-21 DIAGNOSIS — N186 End stage renal disease: Secondary | ICD-10-CM | POA: Diagnosis present

## 2023-12-21 DIAGNOSIS — Z7901 Long term (current) use of anticoagulants: Secondary | ICD-10-CM | POA: Diagnosis not present

## 2023-12-21 DIAGNOSIS — E785 Hyperlipidemia, unspecified: Secondary | ICD-10-CM | POA: Diagnosis not present

## 2023-12-21 DIAGNOSIS — E875 Hyperkalemia: Secondary | ICD-10-CM | POA: Diagnosis not present

## 2023-12-21 DIAGNOSIS — Z96642 Presence of left artificial hip joint: Secondary | ICD-10-CM | POA: Diagnosis present

## 2023-12-21 DIAGNOSIS — I700 Atherosclerosis of aorta: Secondary | ICD-10-CM | POA: Diagnosis not present

## 2023-12-21 DIAGNOSIS — Z9841 Cataract extraction status, right eye: Secondary | ICD-10-CM

## 2023-12-21 DIAGNOSIS — K219 Gastro-esophageal reflux disease without esophagitis: Secondary | ICD-10-CM | POA: Diagnosis present

## 2023-12-21 DIAGNOSIS — I120 Hypertensive chronic kidney disease with stage 5 chronic kidney disease or end stage renal disease: Secondary | ICD-10-CM | POA: Diagnosis present

## 2023-12-21 DIAGNOSIS — I2489 Other forms of acute ischemic heart disease: Secondary | ICD-10-CM | POA: Diagnosis present

## 2023-12-21 DIAGNOSIS — R531 Weakness: Secondary | ICD-10-CM | POA: Diagnosis not present

## 2023-12-21 DIAGNOSIS — Z681 Body mass index (BMI) 19 or less, adult: Secondary | ICD-10-CM

## 2023-12-21 DIAGNOSIS — I440 Atrioventricular block, first degree: Secondary | ICD-10-CM | POA: Diagnosis present

## 2023-12-21 DIAGNOSIS — Z91158 Patient's noncompliance with renal dialysis for other reason: Secondary | ICD-10-CM

## 2023-12-21 DIAGNOSIS — E1122 Type 2 diabetes mellitus with diabetic chronic kidney disease: Secondary | ICD-10-CM | POA: Diagnosis not present

## 2023-12-21 DIAGNOSIS — Z885 Allergy status to narcotic agent status: Secondary | ICD-10-CM

## 2023-12-21 DIAGNOSIS — K529 Noninfective gastroenteritis and colitis, unspecified: Secondary | ICD-10-CM | POA: Diagnosis present

## 2023-12-21 DIAGNOSIS — Z87891 Personal history of nicotine dependence: Secondary | ICD-10-CM

## 2023-12-21 DIAGNOSIS — I2101 ST elevation (STEMI) myocardial infarction involving left main coronary artery: Secondary | ICD-10-CM | POA: Diagnosis not present

## 2023-12-21 DIAGNOSIS — R079 Chest pain, unspecified: Secondary | ICD-10-CM | POA: Diagnosis not present

## 2023-12-21 DIAGNOSIS — Z66 Do not resuscitate: Secondary | ICD-10-CM | POA: Diagnosis present

## 2023-12-21 DIAGNOSIS — Z8673 Personal history of transient ischemic attack (TIA), and cerebral infarction without residual deficits: Secondary | ICD-10-CM

## 2023-12-21 DIAGNOSIS — I2119 ST elevation (STEMI) myocardial infarction involving other coronary artery of inferior wall: Secondary | ICD-10-CM | POA: Diagnosis not present

## 2023-12-21 DIAGNOSIS — R0602 Shortness of breath: Secondary | ICD-10-CM | POA: Diagnosis not present

## 2023-12-21 DIAGNOSIS — Z8249 Family history of ischemic heart disease and other diseases of the circulatory system: Secondary | ICD-10-CM

## 2023-12-21 DIAGNOSIS — R5381 Other malaise: Secondary | ICD-10-CM | POA: Diagnosis present

## 2023-12-21 DIAGNOSIS — N184 Chronic kidney disease, stage 4 (severe): Secondary | ICD-10-CM | POA: Diagnosis not present

## 2023-12-21 DIAGNOSIS — Z79899 Other long term (current) drug therapy: Secondary | ICD-10-CM

## 2023-12-21 DIAGNOSIS — R0789 Other chest pain: Secondary | ICD-10-CM | POA: Diagnosis not present

## 2023-12-21 DIAGNOSIS — Z5941 Food insecurity: Secondary | ICD-10-CM

## 2023-12-21 DIAGNOSIS — I252 Old myocardial infarction: Secondary | ICD-10-CM

## 2023-12-21 DIAGNOSIS — M109 Gout, unspecified: Secondary | ICD-10-CM | POA: Diagnosis present

## 2023-12-21 DIAGNOSIS — M545 Low back pain, unspecified: Secondary | ICD-10-CM | POA: Diagnosis present

## 2023-12-21 DIAGNOSIS — N2581 Secondary hyperparathyroidism of renal origin: Secondary | ICD-10-CM | POA: Diagnosis not present

## 2023-12-21 DIAGNOSIS — R636 Underweight: Secondary | ICD-10-CM | POA: Diagnosis present

## 2023-12-21 LAB — COMPREHENSIVE METABOLIC PANEL WITH GFR
ALT: 24 U/L (ref 0–44)
AST: 16 U/L (ref 15–41)
Albumin: 3.3 g/dL — ABNORMAL LOW (ref 3.5–5.0)
Alkaline Phosphatase: 169 U/L — ABNORMAL HIGH (ref 38–126)
Anion gap: 16 — ABNORMAL HIGH (ref 5–15)
BUN: 73 mg/dL — ABNORMAL HIGH (ref 8–23)
CO2: 14 mmol/L — ABNORMAL LOW (ref 22–32)
Calcium: 7.4 mg/dL — ABNORMAL LOW (ref 8.9–10.3)
Chloride: 108 mmol/L (ref 98–111)
Creatinine, Ser: 8.85 mg/dL — ABNORMAL HIGH (ref 0.61–1.24)
GFR, Estimated: 6 mL/min — ABNORMAL LOW (ref >60)
Glucose, Bld: 90 mg/dL (ref 70–99)
Potassium: 5.8 mmol/L — ABNORMAL HIGH (ref 3.5–5.1)
Sodium: 139 mmol/L (ref 135–145)
Total Bilirubin: 0.2 mg/dL (ref 0.0–1.2)
Total Protein: 6.2 g/dL — ABNORMAL LOW (ref 6.5–8.1)

## 2023-12-21 LAB — CBC WITH DIFFERENTIAL/PLATELET
Abs Immature Granulocytes: 0.05 K/uL (ref 0.00–0.07)
Basophils Absolute: 0.1 K/uL (ref 0.0–0.1)
Basophils Relative: 1 %
Eosinophils Absolute: 0 K/uL (ref 0.0–0.5)
Eosinophils Relative: 0 %
HCT: 27.2 % — ABNORMAL LOW (ref 39.0–52.0)
Hemoglobin: 8.3 g/dL — ABNORMAL LOW (ref 13.0–17.0)
Immature Granulocytes: 1 %
Lymphocytes Relative: 9 %
Lymphs Abs: 0.8 K/uL (ref 0.7–4.0)
MCH: 30.1 pg (ref 26.0–34.0)
MCHC: 30.5 g/dL (ref 30.0–36.0)
MCV: 98.6 fL (ref 80.0–100.0)
Monocytes Absolute: 0.6 K/uL (ref 0.1–1.0)
Monocytes Relative: 7 %
Neutro Abs: 6.8 K/uL (ref 1.7–7.7)
Neutrophils Relative %: 82 %
Platelets: 253 K/uL (ref 150–400)
RBC: 2.76 MIL/uL — ABNORMAL LOW (ref 4.22–5.81)
RDW: 15.8 % — ABNORMAL HIGH (ref 11.5–15.5)
WBC: 8.3 K/uL (ref 4.0–10.5)
nRBC: 0 % (ref 0.0–0.2)

## 2023-12-21 LAB — TROPONIN T, HIGH SENSITIVITY
Troponin T High Sensitivity: 177 ng/L (ref 0–19)
Troponin T High Sensitivity: 170 ng/L (ref 0–19)

## 2023-12-21 MED ORDER — ONDANSETRON HCL 4 MG PO TABS
4.0000 mg | ORAL_TABLET | Freq: Four times a day (QID) | ORAL | Status: DC | PRN
Start: 2023-12-21 — End: 2023-12-24

## 2023-12-21 MED ORDER — OXYCODONE-ACETAMINOPHEN 5-325 MG PO TABS
1.0000 | ORAL_TABLET | Freq: Once | ORAL | Status: AC
  Administered 2023-12-21: 1 via ORAL
  Filled 2023-12-21: qty 1

## 2023-12-21 MED ORDER — SODIUM CHLORIDE 0.9 % IV BOLUS
500.0000 mL | Freq: Once | INTRAVENOUS | Status: AC
  Administered 2023-12-21: 500 mL via INTRAVENOUS

## 2023-12-21 MED ORDER — PANTOPRAZOLE SODIUM 40 MG PO TBEC
40.0000 mg | DELAYED_RELEASE_TABLET | Freq: Every day | ORAL | Status: DC
  Administered 2023-12-22 – 2023-12-24 (×3): 40 mg via ORAL
  Filled 2023-12-21 (×3): qty 1

## 2023-12-21 MED ORDER — ONDANSETRON HCL 4 MG/2ML IJ SOLN
4.0000 mg | Freq: Four times a day (QID) | INTRAMUSCULAR | Status: DC | PRN
  Administered 2023-12-23 – 2023-12-24 (×2): 4 mg via INTRAVENOUS
  Filled 2023-12-21 (×2): qty 2

## 2023-12-21 MED ORDER — ASPIRIN 81 MG PO TBEC: 81.0000 mg | DELAYED_RELEASE_TABLET | Freq: Every day | ORAL | Status: DC

## 2023-12-21 MED ORDER — AMLODIPINE BESYLATE 5 MG PO TABS: 10.0000 mg | ORAL_TABLET | Freq: Every day | ORAL | Status: DC

## 2023-12-21 MED ORDER — TRAZODONE HCL 50 MG PO TABS: 25.0000 mg | ORAL_TABLET | Freq: Every evening | ORAL | Status: DC | PRN

## 2023-12-21 MED ORDER — HYDROCORTISONE (PERIANAL) 2.5 % EX CREA: 1.0000 | TOPICAL_CREAM | Freq: Two times a day (BID) | CUTANEOUS | Status: DC | PRN

## 2023-12-21 MED ORDER — SODIUM ZIRCONIUM CYCLOSILICATE 5 G PO PACK
5.0000 g | PACK | Freq: Once | ORAL | Status: DC
  Filled 2023-12-21: qty 1

## 2023-12-21 MED ORDER — MAGNESIUM HYDROXIDE 400 MG/5ML PO SUSP: 30.0000 mL | Freq: Every day | ORAL | Status: DC | PRN

## 2023-12-21 MED ORDER — METOPROLOL SUCCINATE ER 50 MG PO TB24
50.0000 mg | ORAL_TABLET | Freq: Every day | ORAL | Status: DC
  Administered 2023-12-22 – 2023-12-23 (×2): 50 mg via ORAL
  Filled 2023-12-21 (×2): qty 1

## 2023-12-21 MED ORDER — VITAMIN B-12 1000 MCG PO TABS
1000.0000 ug | ORAL_TABLET | Freq: Every day | ORAL | Status: DC
  Administered 2023-12-22 – 2023-12-24 (×3): 1000 ug via ORAL
  Filled 2023-12-21: qty 1
  Filled 2023-12-21: qty 2
  Filled 2023-12-21: qty 1
  Filled 2023-12-21 (×2): qty 2

## 2023-12-21 MED ORDER — SODIUM ZIRCONIUM CYCLOSILICATE 10 G PO PACK
10.0000 g | PACK | Freq: Once | ORAL | Status: AC
  Administered 2023-12-21: 10 g via ORAL
  Filled 2023-12-21: qty 1

## 2023-12-21 MED ORDER — CALCITRIOL 0.25 MCG PO CAPS
0.2500 ug | ORAL_CAPSULE | Freq: Every day | ORAL | Status: DC
  Administered 2023-12-22 – 2023-12-24 (×3): 0.25 ug via ORAL
  Filled 2023-12-21 (×3): qty 1

## 2023-12-21 MED ORDER — ACETAMINOPHEN 325 MG PO TABS: 650.0000 mg | ORAL_TABLET | Freq: Four times a day (QID) | ORAL | Status: DC | PRN

## 2023-12-21 MED ORDER — ACETAMINOPHEN 650 MG RE SUPP: 650.0000 mg | Freq: Four times a day (QID) | RECTAL | Status: DC | PRN

## 2023-12-21 MED ORDER — SODIUM BICARBONATE 650 MG PO TABS
1300.0000 mg | ORAL_TABLET | Freq: Two times a day (BID) | ORAL | Status: DC
  Administered 2023-12-22 – 2023-12-24 (×5): 1300 mg via ORAL
  Filled 2023-12-21 (×5): qty 2

## 2023-12-21 MED ORDER — NITROGLYCERIN 0.4 MG SL SUBL: 0.4000 mg | SUBLINGUAL_TABLET | SUBLINGUAL | Status: DC | PRN

## 2023-12-21 MED ORDER — HYDROCODONE-ACETAMINOPHEN 5-325 MG PO TABS
1.0000 | ORAL_TABLET | Freq: Four times a day (QID) | ORAL | Status: DC | PRN
  Administered 2023-12-22 – 2023-12-24 (×6): 1 via ORAL
  Filled 2023-12-21 (×6): qty 1

## 2023-12-21 MED ORDER — ISOSORBIDE MONONITRATE ER 30 MG PO TB24
30.0000 mg | ORAL_TABLET | Freq: Every day | ORAL | Status: DC | PRN
  Administered 2023-12-22: 30 mg via ORAL
  Filled 2023-12-21: qty 1

## 2023-12-21 MED ORDER — SODIUM BICARBONATE 8.4 % IV SOLN
50.0000 meq | Freq: Once | INTRAVENOUS | Status: AC
  Administered 2023-12-22: 50 meq via INTRAVENOUS
  Filled 2023-12-21: qty 50

## 2023-12-21 MED ORDER — GABAPENTIN 300 MG PO CAPS
300.0000 mg | ORAL_CAPSULE | Freq: Every day | ORAL | Status: DC
  Administered 2023-12-22 – 2023-12-23 (×3): 300 mg via ORAL
  Filled 2023-12-21 (×3): qty 1

## 2023-12-21 MED ORDER — FOLIC ACID 1 MG PO TABS
1.0000 mg | ORAL_TABLET | Freq: Every day | ORAL | Status: DC
  Administered 2023-12-22 – 2023-12-24 (×3): 1 mg via ORAL
  Filled 2023-12-21 (×3): qty 1

## 2023-12-21 MED ORDER — APIXABAN 2.5 MG PO TABS
2.5000 mg | ORAL_TABLET | Freq: Two times a day (BID) | ORAL | Status: DC
  Administered 2023-12-22: 2.5 mg via ORAL
  Filled 2023-12-21 (×2): qty 1

## 2023-12-21 NOTE — ED Triage Notes (Addendum)
 Ambulatory to triage from [facility] due to reported high blood Calcium  and Potassium levels. Pt reports not feeling good for the past few days. Feeling increased SOB with chest pressure when laying down flat. Reports last peritoneal dialysis x2 days ago - I've missed a few treatments.

## 2023-12-21 NOTE — ED Provider Notes (Signed)
 [facility] Provider Note    Event Date/Time   First MD Initiated Contact with Patient 12/21/23 2121     (approximate)   History   No chief complaint on file.   HPI  Casey French is a 73 y.o. male who presents today with concern of increased fatigue and abnormal labs.  History of peritoneal dialysis, over the last few days has been feeling unwell and has not been doing his regular dialysis sessions.  Unfortunate is resulted in feeling even further unwell, had cardiology appointment today was getting routine labs done and was instructed to come into the emergency department for evaluation after labs resulted.  He states that he feels very weak, apparently has been dealing with persistent symptoms of chest pain shortness of breath lightheadedness for many months now, and also with persistent nausea vomiting for a few weeks without any changes acutely over the last few days.     Physical Exam   Triage Vital Signs: ED Triage Vitals  Encounter Vitals Group     BP 12/21/23 1939 (!) 167/89     Girls Systolic BP Percentile --      Girls Diastolic BP Percentile --      Boys Systolic BP Percentile --      Boys Diastolic BP Percentile --      Pulse Rate 12/21/23 1939 88     Resp 12/21/23 1939 20     Temp 12/21/23 1939 98 F (36.7 C)     Temp Source 12/21/23 1939 Oral     SpO2 12/21/23 1939 100 %     Weight 12/21/23 1939 130 lb (59 kg)     Height 12/21/23 1939 5' 9 (1.753 m)     Head Circumference --      Peak Flow --      Pain Score 12/21/23 1947 10     Pain Loc --      Pain Education --      Exclude from Growth Chart --     Most recent vital signs: Vitals:   12/21/23 1939  BP: (!) 167/89  Pulse: 88  Resp: 20  Temp: 98 F (36.7 C)  SpO2: 100%     General: Awake, no distress.  CV:  Good peripheral perfusion.  Resp:  Normal effort.  Abd:  No distention.  Soft nondistended Other:     ED Results / Procedures / Treatments    Labs (all labs ordered are listed, but only abnormal results are displayed) Labs Reviewed  CBC WITH DIFFERENTIAL/PLATELET - Abnormal; Notable for the following components:      Result Value   RBC 2.76 (*)    Hemoglobin 8.3 (*)    HCT 27.2 (*)    RDW 15.8 (*)    All other components within normal limits  COMPREHENSIVE METABOLIC PANEL WITH GFR - Abnormal; Notable for the following components:   Potassium 5.8 (*)    CO2 14 (*)    BUN 73 (*)    Creatinine, Ser 8.85 (*)    Calcium  7.4 (*)    Total Protein 6.2 (*)    Albumin 3.3 (*)    Alkaline Phosphatase 169 (*)    GFR, Estimated 6 (*)    Anion gap 16 (*)    All other components within normal limits  TROPONIN T, HIGH SENSITIVITY - Abnormal; Notable for the following components:   Troponin T High Sensitivity 177 (*)    All other components within normal limits  TROPONIN T,  HIGH SENSITIVITY - Abnormal; Notable for the following components:   Troponin T High Sensitivity 170 (*)    All other components within normal limits  BASIC METABOLIC PANEL WITH GFR  CBC     EKG  Sinus rhythm with a rate of about 85, axis of about 70, intervals appear to be within normal limits, no obvious ischemia and I appreciate this EKG   RADIOLOGY No acute cardiopulmonary process  PROCEDURES:  Critical Care performed: No  Procedures   MEDICATIONS ORDERED IN ED: Medications  sodium bicarbonate  injection 50 mEq (has no administration in time range)  aspirin  EC tablet 81 mg (has no administration in time range)  HYDROcodone -acetaminophen  (NORCO/VICODIN) 5-325 MG per tablet 1 tablet (has no administration in time range)  amLODipine  (NORVASC ) tablet 10 mg (has no administration in time range)  isosorbide  mononitrate (IMDUR ) 24 hr tablet 30 mg (has no administration in time range)  metoprolol  succinate (TOPROL -XL) 24 hr tablet 50 mg (has no administration in time range)  nitroGLYCERIN  (NITROSTAT ) SL tablet 0.4 mg (has no administration in time  range)  calcitRIOL  (ROCALTROL ) capsule 0.25 mcg (has no administration in time range)  pantoprazole  (PROTONIX ) EC tablet 40 mg (has no administration in time range)  sodium bicarbonate  tablet 1,300 mg (has no administration in time range)  apixaban  (ELIQUIS ) tablet 2.5 mg (has no administration in time range)  cyanocobalamin  (VITAMIN B12) tablet 1,000 mcg (has no administration in time range)  folic acid  (FOLVITE ) tablet 1 mg (has no administration in time range)  gabapentin  (NEURONTIN ) capsule 300 mg (has no administration in time range)  hydrocortisone  (ANUSOL -HC) 2.5 % rectal cream 1 Application (has no administration in time range)  acetaminophen  (TYLENOL ) tablet 650 mg (has no administration in time range)    Or  acetaminophen  (TYLENOL ) suppository 650 mg (has no administration in time range)  traZODone  (DESYREL ) tablet 25 mg (has no administration in time range)  magnesium  hydroxide (MILK OF MAGNESIA) suspension 30 mL (has no administration in time range)  ondansetron  (ZOFRAN ) tablet 4 mg (has no administration in time range)    Or  ondansetron  (ZOFRAN ) injection 4 mg (has no administration in time range)  sodium bicarbonate  injection 50 mEq (has no administration in time range)  sodium chloride  0.9 % bolus 500 mL (500 mLs Intravenous New Bag/Given 12/21/23 2218)  oxyCODONE -acetaminophen  (PERCOCET/ROXICET) 5-325 MG per tablet 1 tablet (1 tablet Oral Given 12/21/23 2217)  sodium zirconium cyclosilicate  (LOKELMA ) packet 10 g (10 g Oral Given 12/21/23 2230)     IMPRESSION / MDM / ASSESSMENT AND PLAN / ED COURSE  I reviewed the triage vital signs and the nursing notes.                               Patient's presentation is most consistent with acute complicated illness / injury requiring diagnostic workup.  73 year old male who presents primarily for abnormal labs and also having increased weakness and fatigue over the last few days it seems.  Here potassium is elevated at 5.8 and  he has a BUN of 73 which is likely contributing to his increased fatigue and weakness.  Troponin here is 177, unfortunately given his dialysis history I wonder if this may be near his baseline, I currently not having active chest pain and I feel unlikely ACS his EKG is without evidence of ischemia.  He likes needs to get dialysis restarted now, reaching out to medicine, unfortunately will be unable to get  dialysis done immediately here, but would be beneficial to have this done in the morning.  Clinical Course as of 12/21/23 2331  Wed Dec 21, 2023  2204 I spoke with Dr. Marcelino who agrees with plan for peritoneal dialysis tomorrow morning.  I spoke with Dr. Georgian who has agreed to evaluate the patient determine course for further medical management. [SK]    Clinical Course User Index [SK] Fernand Rossie HERO, MD     FINAL CLINICAL IMPRESSION(S) / ED DIAGNOSES   Final diagnoses:  Hyperkalemia     Rx / DC Orders   ED Discharge Orders     None        Note:  This document was prepared using Dragon voice recognition software and may include unintentional dictation errors.   Fernand Rossie HERO, MD 12/21/23 [phone]

## 2023-12-21 NOTE — H&P (Incomplete)
 [place]   PATIENT NAME: Casey French    MR#:  [id number]  DATE OF BIRTH:  1950/07/09  DATE OF ADMISSION:  12/21/2023  PRIMARY CARE PHYSICIAN: Valora Lynwood FALCON, MD   Patient is coming from: Home  REQUESTING/REFERRING PHYSICIAN: Fernand Barge, MD  CHIEF COMPLAINT:  Weakness  HISTORY OF PRESENT ILLNESS:  Casey French is a 73 y.o. Caucasian male with medical history significant for coronary artery disease, hypertension, dyslipidemia and type 2 diabetes mellitus, gout and GERD, who presented to the emergency room with acute onset of generalized weakness over the last couple of months which got significantly worse over the last couple of days.  The patient missed his peritoneal dialysis over the last couple of days.  He admitted to nausea and vomiting with mild diarrhea.  He has been having significant low back pain that has been giving him occasional dyspnea.  No cough or wheezing.  No chest pain or palpitations.  No fever or chills.  ED Course: When he came to the ER BP was 167/89 with otherwise normal vital signs.  Labs revealed potassium 5.8 and CO2 of 14 with a BUN of 73 and creatinine 8.85, calcium  7.4 anion gap 16.  Alk phos was 169 with albumin 3.3 and total protein of 6.2.  High sensitive troponin I was 177 and later 170.  CBC showed hemoglobin 8.3 and hematocrit 27.2 close to previous levels. EKG as reviewed by me : EKG showed sinus rhythm with a rate of 88 with first-degree AV block and minimal voltage criteria for LVH. Imaging: Portable chest x-ray showed no acute cardiopulmonary disease.  The patient was given 10 g of p.o. Lokelma , 500 mL IV normal saline bolus, 2 and feels of sodium bicarbonate  and 1 p.o. Percocet.  He will be admitted to the progressive unit bed for further evaluation and management. PAST MEDICAL HISTORY:   Past Medical History:  Diagnosis Date   Acute diverticulitis 06/14/2014   Acute ST elevation myocardial infarction (STEMI) of  inferior wall (HCC) 2003   a.) PCI with Cypher stent to the RCA   Anemia of chronic renal failure    Aortic atherosclerosis    Bladder tumor    CAP (community acquired pneumonia) 06/2022   Cerebral microvascular disease    Coronary artery disease involving native coronary artery of native heart without angina pectoris 2003   a.) PCI with Cypher stent to the RCA 2003; b.) LHC 08/2003: 100% dRCA, 60% pLAD - med mgmt; c.) LHC/PCI 08/21/2015: occluded RCA at site of prior stent placement, 70% mLAD (FFR 0.79; 3.5 x 12 mm Xience DES), 50% dLAD (FFR 0.70); d.) MV 11/11/2016: no ischemia   Depression    Diverticulosis    End-stage renal disease on peritoneal dialysis (HCC)    GERD (gastroesophageal reflux disease)    GI bleed 05/15/2019   Gout    Heart murmur    Hematemesis 05/15/2019   Hepatic cyst    Hepatomegaly    Hyperlipidemia    Hypertension    Lacunar infarction (HCC) 02/11/2007   a.) CT head 02/11/2007: lacunar infarction involving thalamus and external capsule region on the RIGHT; b.) MRI brain 02/03/2020: multiple remote lacunar infarcts involving the RIGHT basal ganglia, RIGHT thalamus, and LEFT middle cerebellar peduncle   Long-term use of aspirin  therapy    Myocardial bridge    On apixaban  therapy    Pancolitis 09/2022   Paroxysmal atrial fibrillation (HCC)    a.) CHA2DS2VASc = 6 (age, HTN,  CVA x 2, prior MI/vascular disease, T2DM) as of 03/11/2023; b.) rate/rhythm maintained on oral metoprolol  succinate; chronically anticoagulated with dose reduced apixaban    Renal cyst, right    Sigmoid diverticulitis 03/2022   T2DM (type 2 diabetes mellitus) (HCC)    Traumatic skull fracture and resulting subarachnoid hemorrhage 03/24/2013   a.) MOI was slipping and falling on ice   Unstable angina (HCC)     PAST SURGICAL HISTORY:   Past Surgical History:  Procedure Laterality Date   BLADDER INSTILLATION N/A 04/05/2023   Procedure: BLADDER INSTILLATION OF GEMCITABINE ;  Surgeon:  Twylla Glendia BROCKS, MD;  Location: [facility] ORS;  Service: Urology;  Laterality: N/A;   CAPD INSERTION N/A 08/13/2020   Procedure: LAPAROSCOPIC INSERTION CONTINUOUS AMBULATORY PERITONEAL DIALYSIS  (CAPD) CATHETER;  Surgeon: Lane Shope, MD;  Location: [facility] ORS;  Service: General;  Laterality: N/A;   CARDIAC CATHETERIZATION     CATARACT EXTRACTION W/PHACO Right 07/05/2019   Procedure: CATARACT EXTRACTION PHACO AND INTRAOCULAR LENS PLACEMENT (IOC) RIGHT DIABETIC;  Surgeon: Jaye Fallow, MD;  Location: [facility] ORS;  Service: Ophthalmology;  Laterality: Right;  US00:59.3 CDE8.41 LOT [id number] h   COLONOSCOPY WITH PROPOFOL  N/A 04/01/2017   Procedure: COLONOSCOPY WITH PROPOFOL ;  Surgeon: Gaylyn Gladis PENNER, MD;  Location: [facility] ENDOSCOPY;  Service: Endoscopy;  Laterality: N/A;   CORONARY ANGIOPLASTY WITH STENT PLACEMENT  10/2001   RCA   CORONARY ANGIOPLASTY WITH STENT PLACEMENT Left 08/21/2015   Procedure: CORONARY ANGIOPLASTY WITH STENT PLACEMENT (mid LAD); Location: [facility]; Surgeon: Zachary Car, MD   ESOPHAGOGASTRODUODENOSCOPY N/A 04/24/2023   Procedure: EGD (ESOPHAGOGASTRODUODENOSCOPY);  Surgeon: Toledo, Ladell POUR, MD;  Location: [facility] ENDOSCOPY;  Service: Gastroenterology;  Laterality: N/A;   HAND SURGERY  2015   3 screws in right hand   TOTAL HIP ARTHROPLASTY Left 04/22/2020   Procedure: TOTAL HIP ARTHROPLASTY ANTERIOR APPROACH;  Surgeon: Kathlynn Sharper, MD;  Location: [facility] ORS;  Service: Orthopedics;  Laterality: Left;   TRANSURETHRAL RESECTION OF BLADDER TUMOR N/A 04/05/2023   Procedure: TURBT (TRANSURETHRAL RESECTION OF BLADDER TUMOR);  Surgeon: Twylla Glendia BROCKS, MD;  Location: [facility] ORS;  Service: Urology;  Laterality: N/A;    SOCIAL HISTORY:   Social History   Tobacco Use   Smoking status: Former    Current packs/day: 0.00    Types: Cigarettes    Quit date: 03/24/2001    Years since quitting: 22.7   Smokeless tobacco: Never  Substance Use Topics   Alcohol use: No    Alcohol/week:  0.0 standard drinks of alcohol    FAMILY HISTORY:   Family History  Problem Relation Age of Onset   Heart disease Mother    Heart disease Father    CAD Other     DRUG ALLERGIES:   Allergies  Allergen Reactions   Morphine  Anxiety    agitation     REVIEW OF SYSTEMS:   ROS As per history of present illness. All pertinent systems were reviewed above. Constitutional, HEENT, cardiovascular, respiratory, GI, GU, musculoskeletal, neuro, psychiatric, endocrine, integumentary and hematologic systems were reviewed and are otherwise negative/unremarkable except for positive findings mentioned above in the HPI.   MEDICATIONS AT HOME:   Prior to Admission medications   Medication Sig Start Date End Date Taking? Authorizing Provider  acetaminophen  (TYLENOL ) 325 MG tablet Take 2 tablets (650 mg total) by mouth every 6 (six) hours as needed for mild pain (or Fever >/= 101). 10/11/22   Jens Durand, MD  amLODipine  (NORVASC ) 10 MG tablet Take 10 mg by mouth daily. 09/16/23 09/15/24  [provider]  apixaban  (ELIQUIS ) 2.5 MG TABS tablet Take 2.5 mg by mouth 2 (two) times daily.    [provider]  aspirin  81 MG EC tablet Take 81 mg by mouth daily.    [provider]  calcitRIOL  (ROCALTROL ) 0.25 MCG capsule Take 1 capsule (0.25 mcg total) by mouth daily. 09/22/23 12/21/23  Von Bellis, MD  cyanocobalamin  1000 MCG tablet Take 1 tablet (1,000 mcg total) by mouth daily. 09/23/23 12/22/23  Von Bellis, MD  folic acid  (FOLVITE ) 1 MG tablet Take 1 tablet (1 mg total) by mouth daily. Take 1 tablet daily 09/22/23 12/21/23  Von Bellis, MD  gabapentin  (NEURONTIN ) 300 MG capsule Take 300 mg by mouth at bedtime. 09/25/16   [provider]  HYDROcodone -acetaminophen  (NORCO/VICODIN) 5-325 MG tablet Take 1 tablet by mouth every 6 (six) hours as needed for moderate pain (pain score 4-6). 04/05/23   Stoioff, Glendia BROCKS, MD  hydrocortisone  (ANUSOL -HC) 2.5 % rectal cream Place 1  application rectally 2 (two) times daily as needed for hemorrhoids or anal itching.    [provider]  isosorbide  mononitrate (IMDUR ) 30 MG 24 hr tablet Take 1 tablet (30 mg total) by mouth daily as needed (Systolic BP greater than 140). 09/22/23 12/21/23  Von Bellis, MD  metoprolol  succinate (TOPROL -XL) 100 MG 24 hr tablet Take 0.5 tablets (50 mg total) by mouth every morning. Take with or immediately following a meal. 04/24/23   Darci Pore, MD  nitroGLYCERIN  (NITROSTAT ) 0.4 MG SL tablet Place 0.4 mg under the tongue every 5 (five) minutes as needed for chest pain.    [provider]  ondansetron  (ZOFRAN ) 4 MG tablet Take 4 mg by mouth every 8 (eight) hours as needed for nausea or vomiting. 04/22/23   [provider]  pantoprazole  (PROTONIX ) 40 MG tablet Take 1 tablet (40 mg total) by mouth daily. 04/24/23 04/23/24  Darci Pore, MD  sodium bicarbonate  650 MG tablet Take 2 tablets (1,300 mg total) by mouth 2 (two) times daily. 09/22/23 09/21/24  Von Bellis, MD      VITAL SIGNS:  Blood pressure (!) 150/91, pulse (!) 101, temperature 98.2 F (36.8 C), temperature source Oral, resp. rate (!) 22, height 5' 9 (1.753 m), weight 59 kg, SpO2 98%.  PHYSICAL EXAMINATION:  Physical Exam  GENERAL:  73 y.o.-year-old Caucasian male patient lying in the bed with no acute distress.  EYES: Pupils equal, round, reactive to light and accommodation. No scleral icterus. Extraocular muscles intact.  HEENT: Head atraumatic, normocephalic. Oropharynx and nasopharynx clear.  NECK:  Supple, no jugular venous distention. No thyroid  enlargement, no tenderness.  LUNGS: Normal breath sounds bilaterally, no wheezing, rales,rhonchi or crepitation. No use of accessory muscles of respiration.  CARDIOVASCULAR: Regular rate and rhythm, S1, S2 normal. No murmurs, rubs, or gallops.  ABDOMEN: Soft, nondistended, nontender. Bowel sounds present. No organomegaly or mass.  EXTREMITIES:  No pedal edema, cyanosis, or clubbing.  NEUROLOGIC: Cranial nerves II through XII are intact. Muscle strength 5/5 in all extremities. Sensation intact. Gait not checked.  PSYCHIATRIC: The patient is alert and oriented x 3.  Normal affect and good eye contact. SKIN: No obvious rash, lesion, or ulcer.   LABORATORY PANEL:   CBC Recent Labs  Lab 12/21/23 2001  WBC 8.3  HGB 8.3*  HCT 27.2*  PLT 253   ------------------------------------------------------------------------------------------------------------------  Chemistries  Recent Labs  Lab 12/21/23 2001  NA 139  K 5.8*  CL 108  CO2 14*  GLUCOSE 90  BUN 73*  CREATININE 8.85*  CALCIUM  7.4*  AST 16  ALT 24  ALKPHOS 169*  BILITOT 0.2   ------------------------------------------------------------------------------------------------------------------  Cardiac Enzymes No results for input(s): TROPONINI in the last 168 hours. ------------------------------------------------------------------------------------------------------------------  RADIOLOGY:  DG Chest Portable 1 View Result Date: 12/21/2023 EXAM: 1 VIEW(S) XRAY OF THE CHEST 12/21/2023 08:04:00 PM COMPARISON: 09/02/2023 CLINICAL HISTORY: SOB FINDINGS: LUNGS AND PLEURA: Linear scarring at the right lung base. Biapical scarring. No pleural effusion. No pneumothorax. HEART AND MEDIASTINUM: Aortic atherosclerosis. No acute abnormality of the cardiac and mediastinal silhouettes. BONES AND SOFT TISSUES: No acute osseous abnormality. IMPRESSION: 1. No acute cardiopulmonary process. Electronically signed by: Franky Crease MD 12/21/2023 08:09 PM EST RP Workstation: [id number]      IMPRESSION AND PLAN:  Assessment and Plan: * Generalized weakness - This is like secondary to generalized to worsening uremia with end-stage renal disease secondary to missing PD sessions. - This could be partly related to mild gastroenteritis given nausea, vomiting and diarrhea. - The patient  will be admitted to a progressive unit bed. - Nephrology consult will be obtained. - Dr. Marcelino was notified about the patient and will plan dialysis in a.m. - The patient was managed for subsequent hyperkalemia and potassium will be followed.  Essential hypertension - Will continue antihypertensive therapy.  Dyslipidemia - Will continue statin therapy.  Coronary artery disease - Will continue aspirin , Imdur , beta-blocker therapy, Imdur  and statin therapy  Low back pain - Pain management will be provided.  Paroxysmal atrial fibrillation (HCC) - Will continue Eliquis  and Toprol -XL.  GERD without esophagitis - Will continue PPI therapy.   DVT prophylaxis: SQ heparin  Advanced Care Planning:  Code Status: The patient is DNR only. Family Communication:  The plan of care was discussed in details with the patient (and family). I answered all questions. The patient agreed to proceed with the above mentioned plan. Further management will depend upon hospital course. Disposition Plan: Back to previous home environment Consults called: Nephrology. All the records are reviewed and case discussed with ED provider.  Status is: Inpatient  At the time of the admission, it appears that the appropriate admission status for this patient is inpatient.  This is judged to be reasonable and necessary in order to provide the required intensity of service to ensure the patient's safety given the presenting symptoms, physical exam findings and initial radiographic and laboratory data in the context of comorbid conditions.  The patient requires inpatient status due to high intensity of service, high risk of further deterioration and high frequency of surveillance required.  I certify that at the time of admission, it is my clinical judgment that the patient will require inpatient hospital care extending more than 2 midnights.                            Dispo: The patient is from: Home               Anticipated d/c is to: Home              Patient currently is not medically stable to d/c.              Difficult to place patient: No  Madison DELENA Peaches M.D on 12/22/2023 at 2:54 AM  [facility]   From 7 PM-7 AM, contact night-coverage [email or web address]  CC: Primary care physician; Valora Lynwood FALCON, MD

## 2023-12-21 NOTE — ED Notes (Signed)
 Pt stated that Casey French told him that his potassium was too high and sent him to the ED.

## 2023-12-21 NOTE — Progress Notes (Signed)
 Established Patient Visit   Chief Complaint: Chief Complaint  Patient presents with  . Coronary Artery Disease   Date of Service: 12/21/2023 Date of Birth: Mar 26, 1950 PCP: Valora Lynwood FALCON, MD  History of Present Illness: Casey French is a 73 y.o.male patient who returns for   1.  Inferior STEMI, Cypher stent RCA 11/22/2001  2.  Occluded distal RCA, residual 60% stenosis prox LAD, 09/03/2003  3.  Paroxysmal atrial fibrillation  4.  Essential hypertension  5.  Hyperlipidemia  6.  Type 2 diabetes  7.  3.5 x 12 mm Xience Alpine stent mid LAD at [facility] 07/21/2015  8.  ESRD, peritoneal dialysis  9.  No longer on kidney transplant list at [facility]  The patient was admitted to [facility] 1/9-1/14/22 for hypertensive emergency, AKI on CKD, mild chest pain, and GI bleed. Renal biopsy showed diabetic nephropathy. He had mildly elevated troponin secondary to hypertensive emergency. Hemoglobin was down to 7.5. 2D echocardiogram on 02/03/2020 showed normal left ventricular function with LVEF 60-65% with no wall motion abnormalities with no significant valvular abnormalities. At discharge, metoprolol  succinate was increased to 200 mg daily, but patient was unable to tolerate it due to dizziness and vision changes, and he self reduced it to 150 mg daily with resolution of symptoms. Repeat labs on 09/12/2020 per hematology showed hemoglobin 7.3, hematocrit 22.6. He is currently receiving iron  transfusion.   The patient underwent successful left hip replacement surgery 04/22/2020.  Preoperative Lexiscan  Myoview  03/06/2020 revealed LVEF 57%, with mild inferior scar with peri-infarct ischemia consistent with his known coronary anatomy.   The patient was hospitalized at [facility] 12/02/2020 with atypical chest pain, with normal high-sensitivity troponin he denies chest pain.  2D echocardiogram 12/03/2020 revealed normal left ventricular function, with LVEF 60 to 65%.   The patient returns for follow-up, reports doing okay.  He  reports new onset chest pain, described as chest tightness, increasing in frequency and severity for 2 months, associated with exertional dyspnea and orthopnea.  He reports infrequent palpitations or heart racing.  He denies peripheral edema.  The patient mains active, walks his dog around the block.  Patient is on peritoneal dialysis, no longer on the kidney transplant list at [facility].  The patient also completed chemotherapy for bladder cancer.   The patient underwent ETT Myoview  on 04/02/2021, exercising for 4 minutes and 25 seconds on a Bruce protocol without chest pain.  Gated scintigraphy revealed normal left ventricular function with LVEF 57%.  SPECT imaging revealed a mild inferior ischemia, consistent with patient's known coronary anatomy, and similar to previous Myoview  in 2022.   The patient has paroxysmal atrial fibrillation, chads vasc score 4, on Eliquis  2.5 mg daily for stroke prevention. The patient has chronic anemia, followed by hematology, and undergoing iron  infusions.  The patient has essential hypertension, systolic blood pressure mildly elevated, currently on metoprolol  succinate, losartan , and amlodipine , which are well tolerated without apparent side effects at current doses.  Blood pressures at home are in normal range.  The patient follows a low-sodium, no added salt diet.  The patient has hyperlipidemia; LDL cholesterol was 65 on 2/75/7976, on rosuvastatin , which is well tolerated without apparent side effects, followed by his primary care provider.  Patient follows a low-cholesterol, low-fat diet.  The patient has type 2 diabetes, hemoglobin A1c was 6.5 on 02/16/2021, on low carbohydrate, ADA diet.  Past Medical and Surgical History  Past Medical History Past Medical History:  Diagnosis Date  . Anemia   . Atrial fibrillation (CMS/HHS-HCC)   .  CAD (coronary artery disease)   . Decreased hearing   . Diabetes mellitus type 2, uncomplicated (CMS/HHS-HCC)   . Diverticulitis   .  GERD (gastroesophageal reflux disease)   . Gout   . History of alcohol use disorder   . History of substance use   . History of tobacco abuse   . HTN (hypertension)   . Hyperlipidemia   . Myocardial infarction (CMS/HHS-HCC) 2003   with a stent and RCA in 2003  . On apixaban  therapy   . Wears dentures     Past Surgical History He has a past surgical history that includes Right hand surgery; Colonoscopy (02/2006); pneumovax (07/2011); Colonoscopy (12/14/2012); Colonoscopy (04/01/2017); arthroplasty hip total; Insertion Peritoneal Cannula/Cath For Dialysis; and EGD @ [facility] (04/24/2023).   Medications and Allergies  Current Medications  Current Outpatient Medications  Medication Sig Dispense Refill  . allopurinoL  (ZYLOPRIM ) 100 MG tablet Take 1 tablet (100 mg total) by mouth once daily 90 tablet 3  . amitriptyline (ELAVIL) 10 MG tablet Take 1 tablet (10 mg total) by mouth at bedtime 30 tablet 11  . amLODIPine  (NORVASC ) 5 MG tablet Take 1 tablet (5 mg total) by mouth once daily 30 tablet 5  . apixaban  (ELIQUIS ) 2.5 mg tablet Take 1 tablet (2.5 mg total) by mouth every 12 (twelve) hours 60 tablet 11  . aspirin  81 MG EC tablet Take 81 mg by mouth once daily.    . aspirin -acetaminophen -caffeine  (EXCEDRIN  MIGRAINE) 250-250-65 mg per tablet Take by mouth.    . ergocalciferol , vitamin D2, 1,250 mcg (50,000 unit) capsule Take 50,000 Units by mouth every 7 (seven) days    . fluticasone  (FLONASE ) 50 mcg/actuation nasal spray SHAKE WELL AND USE 2 SPRAYS IN EACH NOSTRIL EVERY DAY 16 g 5  . gabapentin  (NEURONTIN ) 300 MG capsule Take 1 capsule (300 mg total) by mouth at bedtime 90 capsule 3  . hydrocortisone  2.5 % cream Apply topically 2 (two) times daily. Apply after BM 20 g 1  . isosorbide  mononitrate (IMDUR ) 60 MG ER tablet Take 1 tablet (60 mg total) by mouth once daily 90 tablet 3  . losartan  (COZAAR ) 100 MG tablet Take 1 tablet (100 mg total) by mouth once daily 90 tablet 3  . methocarbamoL   (ROBAXIN ) 500 MG tablet Take 1 tablet (500 mg total) by mouth 4 (four) times daily 30 tablet 0  . metoprolol  succinate (TOPROL -XL) 100 MG XL tablet Take 1 tablet (100 mg total) by mouth once daily 90 tablet 3  . nitroGLYcerin  (NITROSTAT ) 0.4 MG SL tablet Place 1 tablet (0.4 mg total) under the tongue every 5 (five) minutes as needed for Chest pain May take up to 3 doses. 25 tablet 4  . pantoprazole  (PROTONIX ) 40 MG DR tablet Take 1 tablet (40 mg total) by mouth once daily 30 tablet 3  . psyllium husk, with sugar, (METAMUCIL) 3.4 gram/12 gram oral powder Take 3.4 g by mouth once daily Mix with a full glass (240 mL) of fluid.    . rosuvastatin  (CRESTOR ) 40 MG tablet Take 1 tablet (40 mg total) by mouth once daily 90 tablet 3  . sodium bicarbonate  650 MG tablet Take 1,300 mg by mouth 2 (two) times daily    . sodium polystyrene (KAYEXALATE) powder Take 15 g by mouth once daily 454 g 0  . blood glucose diagnostic test strip 1 each (1 strip total) 3 (three) times daily 100 each 11  . blood glucose meter kit as directed 1 each 0  . calcitRIOL  (  ROCALTROL ) 0.25 MCG capsule Take 0.25 mcg by mouth once daily    . escitalopram  oxalate (LEXAPRO ) 10 MG tablet Take 1 tablet (10 mg total) by mouth once daily for 360 days (Patient not taking: Reported on 12/09/2023) 90 tablet 3  . lancets Use 1 each 3 (three) times daily 100 each 11  . predniSONE (DELTASONE) 10 MG tablet Take 1 tablet (10 mg total) by mouth once daily (Patient not taking: Reported on 12/21/2023) 10 tablet 0   No current facility-administered medications for this visit.    Allergies: Morphine   Social and Family History  Social History  reports that he quit smoking about 22 years ago. His smoking use included cigarettes. He started smoking about 24 years ago. He has a 60.1 pack-year smoking history. He has never used smokeless tobacco. He reports that he does not currently use alcohol. He reports that he does not currently use drugs after having  used the following drugs: Marijuana, MDMA (Ecstacy), Heroin, and Other-see comments.  Family History Family History  Problem Relation Name Age of Onset  . Stroke Mother    . Other Father         CVA  . Seizures Father    . Parkinsonism Brother    . Lung cancer Brother    . No Known Problems Daughter    . No Known Problems Daughter      Review of Systems   Review of Systems: The patient denies chest pain, with mild chronic exertional shortness of breath, without orthopnea, paroxysmal nocturnal dyspnea, with chronic pedal edema, with occasional palpitations, without heart racing, presyncope, syncope, with chronic hemrrhoidal bleeding, with recurrent falls, with hip pain. Review of 8 Systems is negative except as described above.  Physical Examination   Vitals: BP (!) 156/68   Pulse 90   Ht 172.7 cm (5' 8)   Wt 59.9 kg (132 lb)   SpO2 93%   BMI 20.07 kg/m  Ht:172.7 cm (5' 8) Wt:59.9 kg (132 lb) ADJ:Anib surface area is 1.7 meters squared. Body mass index is 20.07 kg/m.  General: Alert and oriented. No acute distress. HEENT: Pupils equally reactive to light and accomodation    Neck: Supple, no JVD Lungs: Normal effort of breathing; clear to auscultation bilaterally; no wheezes, rales, rhonchi Heart: Regular rate and rhythm. No murmur, rub, or gallop Abdomen: nondistended, with normal bowel sounds Extremities: no cyanosis, clubbing, with 2+ bilateral lower extremity pitting edema Peripheral Pulses: 2+ radial Skin: Warm, dry, no diaphoresis  Assessment   73 y.o. male with  1. Atypical chest pain   2. Coronary artery disease involving native coronary artery of native heart without angina pectoris   3. Chest pain with high risk for cardiac etiology   4. Inferior MI (CMS/HHS-HCC)   5. ST elevation myocardial infarction involving left main coronary artery (CMS/HHS-HCC)   6. Paroxysmal atrial fibrillation (CMS/HHS-HCC)   7. S/P cardiac catheterization   8. SOB (shortness  of breath) on exertion   9. Type 2 diabetes mellitus without complication, without long-term current use of insulin  (CMS/HHS-HCC)   10. Hyperlipidemia, unspecified hyperlipidemia type   11. Chronic kidney disease, stage IV (severe) (CMS/HHS-HCC)    73 year old gentleman with known coronary artery disease, status post prior coronary stent, DES of mid LAD. The patient has paroxysmal atrial fibrillation on Eliquis  for stroke prevention.  The patient has essential hypertension, systolic blood pressure mildly elevated today on multiple BP medications. The patient has hyperlipidemia with mildly elevated LDL cholesterol on Crestor . The  patient has ESRD, on peritoneal dialysis, no longer on the kidney transplant list at [facility].  The patient reports a 63-month history of chest pain, shortness of breath, and orthopnea, increasing in frequency and severity over 2 months.   Plan   1.  Continue current medications 2.  Counseled patient about low-sodium diet 3.  DASH diet printed instructions given to the patient 4.  Counseled patient about low-cholesterol diet 5.  Continue rosuvastatin  for hyperlipidemia management 6.  Mediterranean diet printed instructions given to the patient 7.  Check CBC 8.  2D echocardiogram 9.  Lexiscan  Myoview  10.  Return to clinic after 2D echocardiogram and Lexiscan  Myoview   Orders Placed This Encounter  Procedures  . NM myocardial perfusion SPECT multiple (stress and rest)  . CBC w/auto Differential (5 Part)  . Basic Metabolic Panel (BMP)  . ECG 12-lead  . ECG stress test only  . Echo complete    Return in about 1 week (around 12/28/2023), or after echo, Lexi-Myo.   MARSA DOOMS, MD PhD [facility]

## 2023-12-22 ENCOUNTER — Encounter: Payer: Self-pay | Admitting: Family Medicine

## 2023-12-22 DIAGNOSIS — M545 Low back pain, unspecified: Secondary | ICD-10-CM | POA: Insufficient documentation

## 2023-12-22 DIAGNOSIS — I2510 Atherosclerotic heart disease of native coronary artery without angina pectoris: Secondary | ICD-10-CM

## 2023-12-22 DIAGNOSIS — R531 Weakness: Secondary | ICD-10-CM

## 2023-12-22 LAB — BASIC METABOLIC PANEL WITH GFR
Anion gap: 16 — ABNORMAL HIGH (ref 5–15)
BUN: 72 mg/dL — ABNORMAL HIGH (ref 8–23)
CO2: 16 mmol/L — ABNORMAL LOW (ref 22–32)
Calcium: 7 mg/dL — ABNORMAL LOW (ref 8.9–10.3)
Chloride: 111 mmol/L (ref 98–111)
Creatinine, Ser: 8.75 mg/dL — ABNORMAL HIGH (ref 0.61–1.24)
GFR, Estimated: 6 mL/min — ABNORMAL LOW (ref >60)
Glucose, Bld: 73 mg/dL (ref 70–99)
Potassium: 5.5 mmol/L — ABNORMAL HIGH (ref 3.5–5.1)
Sodium: 143 mmol/L (ref 135–145)

## 2023-12-22 LAB — CBC
HCT: 21.6 % — ABNORMAL LOW (ref 39.0–52.0)
Hemoglobin: 6.7 g/dL — ABNORMAL LOW (ref 13.0–17.0)
MCH: 30.2 pg (ref 26.0–34.0)
MCHC: 31 g/dL (ref 30.0–36.0)
MCV: 97.3 fL (ref 80.0–100.0)
Platelets: 226 K/uL (ref 150–400)
RBC: 2.22 MIL/uL — ABNORMAL LOW (ref 4.22–5.81)
RDW: 15.8 % — ABNORMAL HIGH (ref 11.5–15.5)
WBC: 6.9 K/uL (ref 4.0–10.5)
nRBC: 0 % (ref 0.0–0.2)

## 2023-12-22 LAB — PREPARE RBC (CROSSMATCH)

## 2023-12-22 LAB — TROPONIN T, HIGH SENSITIVITY
Troponin T High Sensitivity: 249 ng/L (ref 0–19)
Troponin T High Sensitivity: 245 ng/L (ref 0–19)

## 2023-12-22 LAB — HEMOGLOBIN AND HEMATOCRIT, BLOOD
HCT: 26.1 % — ABNORMAL LOW (ref 39.0–52.0)
Hemoglobin: 8.3 g/dL — ABNORMAL LOW (ref 13.0–17.0)

## 2023-12-22 MED ORDER — AMLODIPINE BESYLATE 10 MG PO TABS
10.0000 mg | ORAL_TABLET | Freq: Every day | ORAL | Status: DC
  Administered 2023-12-22 – 2023-12-24 (×3): 10 mg via ORAL
  Filled 2023-12-22 (×3): qty 1

## 2023-12-22 MED ORDER — HYDRALAZINE HCL 20 MG/ML IJ SOLN
5.0000 mg | Freq: Four times a day (QID) | INTRAMUSCULAR | Status: DC | PRN
Start: 2023-12-22 — End: 2023-12-24
  Administered 2023-12-22: 5 mg via INTRAVENOUS
  Filled 2023-12-22: qty 1

## 2023-12-22 MED ORDER — APIXABAN 2.5 MG PO TABS
2.5000 mg | ORAL_TABLET | Freq: Two times a day (BID) | ORAL | Status: DC
  Administered 2023-12-22 – 2023-12-24 (×4): 2.5 mg via ORAL
  Filled 2023-12-22 (×4): qty 1

## 2023-12-22 MED ORDER — ASPIRIN 81 MG PO TBEC
81.0000 mg | DELAYED_RELEASE_TABLET | Freq: Every day | ORAL | Status: DC
  Administered 2023-12-22 – 2023-12-24 (×3): 81 mg via ORAL
  Filled 2023-12-22 (×3): qty 1

## 2023-12-22 MED ORDER — SODIUM CHLORIDE 0.9% IV SOLUTION
Freq: Once | INTRAVENOUS | Status: AC
  Filled 2023-12-22: qty 250

## 2023-12-22 MED ORDER — CALCIUM CARBONATE ANTACID 500 MG PO CHEW
2.0000 | CHEWABLE_TABLET | Freq: Three times a day (TID) | ORAL | Status: DC
  Administered 2023-12-23 – 2023-12-24 (×4): 400 mg via ORAL
  Filled 2023-12-22 (×4): qty 2

## 2023-12-22 MED ORDER — DELFLEX-LC/1.5% DEXTROSE 344 MOSM/L IP SOLN
INTRAPERITONEAL | Status: DC
  Administered 2023-12-22: 6000 mL via INTRAPERITONEAL
  Filled 2023-12-22 (×3): qty 3000

## 2023-12-22 MED ORDER — GENTAMICIN SULFATE 0.1 % EX CREA
1.0000 | TOPICAL_CREAM | Freq: Every day | CUTANEOUS | Status: DC
  Administered 2023-12-22 – 2023-12-23 (×2): 1 via TOPICAL
  Filled 2023-12-22 (×3): qty 15

## 2023-12-22 NOTE — Assessment & Plan Note (Signed)
-   Will continue Eliquis  and Toprol -XL.

## 2023-12-22 NOTE — Assessment & Plan Note (Signed)
-   Will continue antihypertensive therapy.

## 2023-12-22 NOTE — Assessment & Plan Note (Signed)
-   Will continue PPI therapy.

## 2023-12-22 NOTE — Progress Notes (Signed)
 PROGRESS NOTE    Casey French   FMW:[id number] DOB: Apr 28, 1950  DOA: 12/21/2023 Date of Service: 12/22/23 which is hospital day 1  PCP: Valora Lynwood FALCON, MD    Hospital course / significant events:   HPI: Casey French is a 73 y.o. Caucasian male with medical history significant for coronary artery disease, hypertension, dyslipidemia and type 2 diabetes mellitus, gout and GERD, who presented to the emergency room with worsening generalized weakness, significantly worse over the last couple of days.  The patient missed his peritoneal dialysis over the last couple of days.  He reported nausea and vomiting with mild diarrhea.  Advised to come to the ED d/t high K on labs at his cardiologist   11/26: to ED. K 5.8. Given lokelma , also 500 cc bolus NS, sodium bicarb. Admitted to hospitalist.  11/27: reports persistent chest pain / pressure, repeating troponin EKG. Got dialysis today. Hgb drop, got 1 unit PRBC no bleeding and HH improved, cautious restart ASA/plavix.      Consultants:  Nephrology   Procedures/Surgeries: none      ASSESSMENT & PLAN:   Generalized weakness. Treat underlying cause(s) as below  Chest pain atypical Elevation troponin due to demand ischemia in setting of ESRD + gastroenteritis  Repeating troponin expect will be high w/ ESRD but looking at trend EKG.  Echo  ESRD Hyperkalemia d/t missed dialysis Worsening uremia with end-stage renal disease  Got dialysis today. Nephrology to follow Monitor BMP  Mild gastroenteritis  Supportive symptomatic care   Acute anemia on anemia chronic disease, no evidence for bleeding, likely is dilutional component Hgb at admission 8.3 on 12/21/23 --> 6.7 on 12/22/23   Given drop to <7 will order 1 unit PRBC Follow HH closely  HH improved and no bleeding, cautious restart ASA/Eliquis     Essential hypertension Lower BP this morning but not hypotensive, given amenia will hold amlodipine  --> BP higher  afternoon restart amlodpinine and added hydralazine  prn  Continue metoprolol  given Afib to avoid rebound tachycardia  HLD Statin  Paroxysmal Afib Anemia --> hold Eliquis  --> restart Continue metoprolol    Coronary artery disease Anemia --> hold ASA --> restart  Continue beta-blocker, Imdur  and statin    Low back pain Pain mgt   GERD without esophagitis PPI      Borderline underweight based on BMI: Body mass index is 19.2 kg/m.SABRA Significantly low or high BMI is associated with higher medical risk.  Underweight - under 18  overweight - 25 to 29 obese - 30 or more Class 1 obesity: BMI of 30.0 to 34 Class 2 obesity: BMI of 35.0 to 39 Class 3 obesity: BMI of 40.0 to 49 Super Morbid Obesity: BMI 50-59 Super-super Morbid Obesity: BMI 60+ Healthy nutrition and physical activity advised as adjunct to other disease management and risk reduction treatments    DVT prophylaxis: eliquis  IV fluids: no continuous IV fluids  Nutrition: renal Central lines / other devices: none  Code Status: DNR ACP documentation reviewed:  none on file in [facility]  TOC needs: TBD Medical barriers to dispo: anemai, hyperk, chest pain, pend echo. Expected medical readiness for discharge 1-2 days.              Subjective / Brief ROS:  Patient reports chest tightness w/ deep breath, reproducible, describes as tightness/pressure Denies SOB.  Pain controlled.  Denies new weakness.  Tolerating diet.  Reports no concerns w/ urination/defecation.   Family Communication: wife at bedside     Objective Findings:  Vitals:   12/22/23 1200 12/22/23 1230 12/22/23 1301 12/22/23 1423  BP: (!) 151/88 (!) 150/88 (!) 152/91 (!) 166/92  Pulse: 81 83 80 84  Resp: 20 (!) 25 18   Temp:   (!) 97.4 F (36.3 C) 97.9 F (36.6 C)  TempSrc:   Oral Oral  SpO2:  92%  93%  Weight:      Height:        Intake/Output Summary (Last 24 hours) at 12/22/2023 1820 Last data filed at 12/22/2023 1504 Gross  per 24 hour  Intake 956.5 ml  Output --  Net 956.5 ml   Filed Weights   12/21/23 1939  Weight: 59 kg    Examination:  Physical Exam Constitutional:      General: He is not in acute distress.    Appearance: He is ill-appearing.  Cardiovascular:     Rate and Rhythm: Normal rate and regular rhythm.  Pulmonary:     Effort: Pulmonary effort is normal.     Breath sounds: Rales (very faint) present.  Musculoskeletal:     Right lower leg: No edema.     Left lower leg: No edema.  Skin:    General: Skin is warm and dry.  Neurological:     Mental Status: He is alert and oriented to person, place, and time. Mental status is at baseline.          Scheduled Medications:   amLODipine   10 mg Oral Daily   apixaban   2.5 mg Oral BID   aspirin  EC  81 mg Oral Daily   calcitRIOL   0.25 mcg Oral Daily   [START ON 12/23/2023] calcium  carbonate  2 tablet Oral TID WC   cyanocobalamin   1,000 mcg Oral Daily   folic acid   1 mg Oral Daily   gabapentin   300 mg Oral QHS   gentamicin  cream  1 Application Topical Daily   metoprolol  succinate  50 mg Oral Q breakfast   pantoprazole   40 mg Oral Daily   sodium bicarbonate   1,300 mg Oral BID    Continuous Infusions:  dialysis solution 1.5% low-MG/low-CA      PRN Medications:  acetaminophen  **OR** acetaminophen , hydrALAZINE , HYDROcodone -acetaminophen , hydrocortisone , isosorbide  mononitrate, magnesium  hydroxide, nitroGLYCERIN , ondansetron  **OR** ondansetron  (ZOFRAN ) IV, traZODone   Antimicrobials from admission:  Anti-infectives (From admission, onward)    None           Data Reviewed:  I have personally reviewed the following...  CBC: Recent Labs  Lab 12/21/23 2001 12/22/23 0456 12/22/23 1501  WBC 8.3 6.9  --   NEUTROABS 6.8  --   --   HGB 8.3* 6.7* 8.3*  HCT 27.2* 21.6* 26.1*  MCV 98.6 97.3  --   PLT 253 226  --    Basic Metabolic Panel: Recent Labs  Lab 12/21/23 2001 12/22/23 0456  NA 139 143  K 5.8* 5.5*  CL 108  111  CO2 14* 16*  GLUCOSE 90 73  BUN 73* 72*  CREATININE 8.85* 8.75*  CALCIUM  7.4* 7.0*   GFR: Estimated Creatinine Clearance: 6.4 mL/min (A) (by C-G formula based on SCr of 8.75 mg/dL (H)). Liver Function Tests: Recent Labs  Lab 12/21/23 2001  AST 16  ALT 24  ALKPHOS 169*  BILITOT 0.2  PROT 6.2*  ALBUMIN 3.3*   No results for input(s): LIPASE, AMYLASE in the last 168 hours. No results for input(s): AMMONIA in the last 168 hours. Coagulation Profile: No results for input(s): INR, PROTIME in the last 168 hours. Cardiac Enzymes: No  results for input(s): CKTOTAL, CKMB, CKMBINDEX, TROPONINI in the last 168 hours. BNP (last 3 results) No results for input(s): PROBNP in the last 8760 hours. HbA1C: No results for input(s): HGBA1C in the last 72 hours. CBG: No results for input(s): GLUCAP in the last 168 hours. Lipid Profile: No results for input(s): CHOL, HDL, LDLCALC, TRIG, CHOLHDL, LDLDIRECT in the last 72 hours. Thyroid  Function Tests: No results for input(s): TSH, T4TOTAL, FREET4, T3FREE, THYROIDAB in the last 72 hours. Anemia Panel: No results for input(s): VITAMINB12, FOLATE, FERRITIN, TIBC, IRON , RETICCTPCT in the last 72 hours. Most Recent Urinalysis On File:     Component Value Date/Time   COLORURINE YELLOW (A) 04/22/2023 1952   APPEARANCEUR Clear 10/04/2023 1305   LABSPEC 1.020 04/22/2023 1952   PHURINE 7.0 04/22/2023 1952   GLUCOSEU 1+ (A) 10/04/2023 1305   HGBUR MODERATE (A) 04/22/2023 1952   BILIRUBINUR Negative 10/04/2023 1305   KETONESUR NEGATIVE 04/22/2023 1952   PROTEINUR 3+ (A) 10/04/2023 1305   PROTEINUR >=300 (A) 04/22/2023 1952   NITRITE Negative 10/04/2023 1305   NITRITE NEGATIVE 04/22/2023 1952   LEUKOCYTESUR Negative 10/04/2023 1305   LEUKOCYTESUR NEGATIVE 04/22/2023 1952   Sepsis Labs: @LABRCNTIP (procalcitonin:4,lacticidven:4) Microbiology: No results found for this or any previous  visit (from the past 240 hours).    Radiology Studies last 3 days: DG Chest Portable 1 View Result Date: 12/21/2023 EXAM: 1 VIEW(S) XRAY OF THE CHEST 12/21/2023 08:04:00 PM COMPARISON: 09/02/2023 CLINICAL HISTORY: SOB FINDINGS: LUNGS AND PLEURA: Linear scarring at the right lung base. Biapical scarring. No pleural effusion. No pneumothorax. HEART AND MEDIASTINUM: Aortic atherosclerosis. No acute abnormality of the cardiac and mediastinal silhouettes. BONES AND SOFT TISSUES: No acute osseous abnormality. IMPRESSION: 1. No acute cardiopulmonary process. Electronically signed by: Franky Crease MD 12/21/2023 08:09 PM EST RP Workstation: [id number]       Time spent: 50 min     Lillyonna Armstead, DO [facility] 12/22/2023, 6:20 PM    Dictation software may have been used to generate the above note. Typos may occur and escape review in typed/dictated notes. Please contact Dr Marsa directly for clarity if needed.  Staff may message me via secure chat in Epic  but this may not receive an immediate response,  please page me for urgent matters!  If 7PM-7AM, please contact night coverage [email or web address]

## 2023-12-22 NOTE — ED Notes (Signed)
 Message left for incoming hospitalist regarding morning lab results/hemoglobin/crit. Care team aware of trops and Plan of care--to have dialysis this morning. No distress, mental status at baseline. Moving all extremities well. Call bell in reach,

## 2023-12-22 NOTE — Plan of Care (Signed)

## 2023-12-22 NOTE — Progress Notes (Signed)
 [facility]  ROUNDING NOTE   Subjective:   Patient well-known to us  from prior admissions. Came in with generalized weakness. Has missed several days of dialysis treatments. Patient on peritoneal dialysis at home. Has left lower quadrant catheter in place. Found to have hyperkalemia. Potassium down to 5.5 this a.m.  Objective:  Vital signs in last 24 hours:  Temp:  [97.9 F (36.6 C)-98.2 F (36.8 C)] 97.9 F (36.6 C) (11/27 0857) Pulse Rate:  [88-102] 98 (11/27 0800) Resp:  [18-26] 18 (11/27 0800) BP: (130-187)/(70-92) 136/78 (11/27 0800) SpO2:  [97 %-100 %] 97 % (11/27 0823) Weight:  [59 kg] 59 kg (11/26 1939)  Weight change:  Filed Weights   12/21/23 1939  Weight: 59 kg    Intake/Output: I/O last 3 completed shifts: In: 500 [IV Piggyback:500] Out: -    Intake/Output this shift:  No intake/output data recorded.  Physical Exam: General: No acute distress  Head: Normocephalic, atraumatic. Moist oral mucosal membranes  Neck: Supple  Lungs:  Clear to auscultation, normal effort  Heart: S1S2 no rubs  Abdomen:  Soft, nontender, bowel sounds present  Extremities: No peripheral edema.  Neurologic: Awake, alert, following commands  Skin: No acute rash  Access: PD catheter in place    Basic Metabolic Panel: Recent Labs  Lab 12/21/23 2001 12/22/23 0456  NA 139 143  K 5.8* 5.5*  CL 108 111  CO2 14* 16*  GLUCOSE 90 73  BUN 73* 72*  CREATININE 8.85* 8.75*  CALCIUM  7.4* 7.0*    Liver Function Tests: Recent Labs  Lab 12/21/23 2001  AST 16  ALT 24  ALKPHOS 169*  BILITOT 0.2  PROT 6.2*  ALBUMIN 3.3*   No results for input(s): LIPASE, AMYLASE in the last 168 hours. No results for input(s): AMMONIA in the last 168 hours.  CBC: Recent Labs  Lab 12/21/23 2001 12/22/23 0456  WBC 8.3 6.9  NEUTROABS 6.8  --   HGB 8.3* 6.7*  HCT 27.2* 21.6*  MCV 98.6 97.3  PLT 253 226    Cardiac Enzymes: No results for input(s): CKTOTAL,  CKMB, CKMBINDEX, TROPONINI in the last 168 hours.  BNP: Invalid input(s): POCBNP  CBG: No results for input(s): GLUCAP in the last 168 hours.  Microbiology: Results for orders placed or performed in visit on 10/04/23  Microscopic Examination     Status: Abnormal   Collection Time: 10/04/23  1:05 PM   Urine  Result Value Ref Range Status   WBC, UA 6-10 (A) 0 - 5 /hpf Final   RBC, Urine 3-10 (A) 0 - 2 /hpf Final   Epithelial Cells (non renal) 0-10 0 - 10 /hpf Final   Bacteria, UA Few None seen/Few Final    Coagulation Studies: No results for input(s): LABPROT, INR in the last 72 hours.  Urinalysis: No results for input(s): COLORURINE, LABSPEC, PHURINE, GLUCOSEU, HGBUR, BILIRUBINUR, KETONESUR, PROTEINUR, UROBILINOGEN, NITRITE, LEUKOCYTESUR in the last 72 hours.  Invalid input(s): APPERANCEUR    Imaging: DG Chest Portable 1 View Result Date: 12/21/2023 EXAM: 1 VIEW(S) XRAY OF THE CHEST 12/21/2023 08:04:00 PM COMPARISON: 09/02/2023 CLINICAL HISTORY: SOB FINDINGS: LUNGS AND PLEURA: Linear scarring at the right lung base. Biapical scarring. No pleural effusion. No pneumothorax. HEART AND MEDIASTINUM: Aortic atherosclerosis. No acute abnormality of the cardiac and mediastinal silhouettes. BONES AND SOFT TISSUES: No acute osseous abnormality. IMPRESSION: 1. No acute cardiopulmonary process. Electronically signed by: Franky Crease MD 12/21/2023 08:09 PM EST RP Workstation: [id number]     Medications:  dialysis solution 1.5% low-MG/low-CA      calcitRIOL   0.25 mcg Oral Daily   cyanocobalamin   1,000 mcg Oral Daily   folic acid   1 mg Oral Daily   gabapentin   300 mg Oral QHS   gentamicin  cream  1 Application Topical Daily   metoprolol  succinate  50 mg Oral Q breakfast   pantoprazole   40 mg Oral Daily   sodium bicarbonate   1,300 mg Oral BID   acetaminophen  **OR** acetaminophen , HYDROcodone -acetaminophen , hydrocortisone , isosorbide  mononitrate,  magnesium  hydroxide, nitroGLYCERIN , ondansetron  **OR** ondansetron  (ZOFRAN ) IV, traZODone   Assessment/ Plan:  73 y.o. male with past medical history of coronary artery disease, hypertension, hyperlipidemia, diabetes mellitus type 2, gout, GERD, ESRD on PD, anemia chronic kidney disease, secondary hyperparathyroidism who presented with generalized weakness and missed peritoneal dialysis treatments.  1.  ESRD on PD.  Patient has missed several days of peritoneal dialysis due to generalized weakness.  We will resume PD this a.m. and perform dialysis treatment during the daytime.  We will plan to resume normal treatments tomorrow evening if still here.  2.  Hyperkalemia.  Serum potassium down to 5.5.  This was treated with potassium binders.  Patient to be placed on PD shortly and this should help to bring potassium down.  3.  Anemia of chronic kidney disease.  Hemoglobin down to 6.7.  Patient receiving blood transfusion this a.m.  Monitor CBC.  Continue Mircera as outpatient.  4.  Secondary hyperparathyroidism.  Monitor bone metabolism parameters over the course of the hospitalization.   LOS: 1 Charlese Gruetzmacher 11/27/202510:16 AM

## 2023-12-22 NOTE — ED Notes (Signed)
 Advised nurse that patient has ready bed

## 2023-12-22 NOTE — Assessment & Plan Note (Addendum)
-   This is like secondary to generalized to worsening uremia with end-stage renal disease secondary to missing PD sessions. - This could be partly related to mild gastroenteritis given nausea, vomiting and diarrhea. - The patient will be admitted to a progressive unit bed. - Nephrology consult will be obtained. - Dr. Marcelino was notified about the patient and will plan dialysis in a.m. - The patient was managed for subsequent hyperkalemia and potassium will be followed.

## 2023-12-22 NOTE — Assessment & Plan Note (Signed)
 Will continue statin therapy

## 2023-12-22 NOTE — Hospital Course (Addendum)
 Hospital course / significant events:   HPI: Casey French is a 73 y.o. Caucasian male with medical history significant for coronary artery disease, hypertension, dyslipidemia and type 2 diabetes mellitus, gout and GERD, who presented to the emergency room with worsening generalized weakness, significantly worse over the last couple of days.  The patient missed his peritoneal dialysis over the last couple of days.  He reported nausea and vomiting with mild diarrhea.  Advised to come to the ED d/t high K on labs at his cardiologist   11/26: to ED. K 5.8. Given lokelma , also 500 cc bolus NS, sodium bicarb. Admitted to hospitalist.  11/27: reports persistent chest pain / pressure, repeating troponin EKG. Got dialysis today. Hgb drop, got 1 unit PRBC no bleeding and HH improved, cautious restart ASA/plavix.  11/28: troponin up, echo no concerns. Cardiology consult - nothing invasive planned and tropes though up are not concerning for ACS. Keep another night to ensure effective PD, debility will request PT/OT see him  11/29: PT/OT amb ***     Consultants:  Nephrology  Cardiology   Procedures/Surgeries: none      ASSESSMENT & PLAN:   Generalized weakness. Treat underlying cause(s) as below  Chest pain atypical Elevation troponin due to demand ischemia in setting of ESRD + gastroenteritis  Repeating troponin expect will be high w/ ESRD but looking at trend EKG.  Echo  ESRD Hyperkalemia d/t missed dialysis Worsening uremia with end-stage renal disease  Got dialysis today. Nephrology to follow Monitor BMP  Mild gastroenteritis  Supportive symptomatic care   Acute anemia on anemia chronic disease, no evidence for bleeding, likely is dilutional component Hgb at admission 8.3 on 12/21/23 --> 6.7 on 12/22/23   Given drop to <7 will order 1 unit PRBC Follow HH closely  HH improved and no bleeding, cautious restart ASA/Eliquis     Essential hypertension Lower BP this morning  but not hypotensive, given amenia will hold amlodipine  --> BP higher afternoon restart amlodpinine and added hydralazine  prn  Continue metoprolol  given Afib to avoid rebound tachycardia  HLD Statin  Paroxysmal Afib Anemia --> hold Eliquis  --> restart Continue metoprolol    Coronary artery disease Anemia --> hold ASA --> restart  Continue beta-blocker, Imdur  and statin    Low back pain Pain mgt   GERD without esophagitis PPI      Borderline underweight based on BMI: Body mass index is 18.1 kg/m.SABRA Significantly low or high BMI is associated with higher medical risk.  Underweight - under 18  overweight - 25 to 29 obese - 30 or more Class 1 obesity: BMI of 30.0 to 34 Class 2 obesity: BMI of 35.0 to 39 Class 3 obesity: BMI of 40.0 to 49 Super Morbid Obesity: BMI 50-59 Super-super Morbid Obesity: BMI 60+ Healthy nutrition and physical activity advised as adjunct to other disease management and risk reduction treatments    DVT prophylaxis: eliquis  IV fluids: no continuous IV fluids  Nutrition: renal Central lines / other devices: none  Code Status: DNR ACP documentation reviewed:  none on file in [facility]  TOC needs: TBD Medical barriers to dispo: anemai, hyperk, chest pain, pend echo. Expected medical readiness for discharge 1-2 days.

## 2023-12-22 NOTE — Progress Notes (Signed)
 Patient complaining of chest tightness. Natalie Alexander, DO present at bedside. Trops ordered. EKG ordered.   EKG completed by this RN.

## 2023-12-22 NOTE — Assessment & Plan Note (Signed)
-   Will continue aspirin , Imdur , beta-blocker therapy, Imdur  and statin therapy

## 2023-12-22 NOTE — Progress Notes (Signed)
 Patient tolerated treatment well   12/22/23 2130  Peritoneal Catheter Left lower abdomen Continuous ambulatory  Placement Date/Time: 08/13/20 1246   Procedural Verification: Medical records & consent reviewed  Person Inserting LDA: Dr Derrill  Catheter Location: Left lower abdomen  Catheter Conversion: Extension added  Serial / Lot #: [id number]  Dialysis Type: C...  Site Assessment Clean, Dry, Intact  Drainage Description None  Catheter status Deaccessed;Capped;Clamped  Dressing Gauze/Drain sponge  Dressing Status Clean, Dry, Intact  Dressing Intervention Assessed, no intervention needed  Completion  Treatment Status Complete  Initial Drain Volume 7  Average Dwell Time-Hour(s) 1  Average Dwell Time-Min(s) 30  Average Drain Time 18  Total Therapy Volume 8000  Total Therapy Time-Hour(s) 7  Total Therapy Time-Min(s) 50  Weight after Drain 123 lb 3.8 oz (55.9 kg)  Effluent Appearance Clear;Yellow  Fluid Balance - CCPD  Total UF (- value on cycler, pt gain) -346 mL  Procedure Comments  Tolerated treatment well? Yes  Education / Care Plan  Dialysis Education Provided Yes  Hand-off documentation  Hand-off Given Given to shift RN/LPN  Report given to (Full Name) Biesecker, Jeoffrey SAILOR, RN

## 2023-12-22 NOTE — Progress Notes (Signed)
  Peritoneal Dialysis Treatment Initiation Note     Pre Treatment Weight: 57.7 kg   Consent signed and in chart.  PD treatment initiated via aseptic technique.    Patient is awake and alert. No complaints of pain.    PD exit site clean, dry and intact.  Gentamicin  and new dressing applied.    Hand-off given to the patient's nurse.  Education provided to dept staff  regarding PD machine and how  to contact tech support if machine  alarms.    Ozell Jubilee RN [facility]

## 2023-12-22 NOTE — Assessment & Plan Note (Signed)
-   Pain management will be provided. 

## 2023-12-23 ENCOUNTER — Inpatient Hospital Stay: Admit: 2023-12-23 | Discharge: 2023-12-23 | Disposition: A | Attending: Osteopathic Medicine

## 2023-12-23 DIAGNOSIS — R531 Weakness: Secondary | ICD-10-CM | POA: Diagnosis not present

## 2023-12-23 DIAGNOSIS — Z992 Dependence on renal dialysis: Secondary | ICD-10-CM | POA: Diagnosis not present

## 2023-12-23 DIAGNOSIS — N186 End stage renal disease: Secondary | ICD-10-CM | POA: Diagnosis not present

## 2023-12-23 LAB — PHOSPHORUS: Phosphorus: 8.4 mg/dL — ABNORMAL HIGH (ref 2.5–4.6)

## 2023-12-23 LAB — BASIC METABOLIC PANEL WITH GFR
Anion gap: 16 — ABNORMAL HIGH (ref 5–15)
BUN: 61 mg/dL — ABNORMAL HIGH (ref 8–23)
CO2: 19 mmol/L — ABNORMAL LOW (ref 22–32)
Calcium: 7.2 mg/dL — ABNORMAL LOW (ref 8.9–10.3)
Chloride: 108 mmol/L (ref 98–111)
Creatinine, Ser: 8.07 mg/dL — ABNORMAL HIGH (ref 0.61–1.24)
GFR, Estimated: 7 mL/min — ABNORMAL LOW (ref >60)
Glucose, Bld: 86 mg/dL (ref 70–99)
Potassium: 4.9 mmol/L (ref 3.5–5.1)
Sodium: 143 mmol/L (ref 135–145)

## 2023-12-23 LAB — CBC
HCT: 26.2 % — ABNORMAL LOW (ref 39.0–52.0)
Hemoglobin: 8.6 g/dL — ABNORMAL LOW (ref 13.0–17.0)
MCH: 30.1 pg (ref 26.0–34.0)
MCHC: 32.8 g/dL (ref 30.0–36.0)
MCV: 91.6 fL (ref 80.0–100.0)
Platelets: 224 K/uL (ref 150–400)
RBC: 2.86 MIL/uL — ABNORMAL LOW (ref 4.22–5.81)
RDW: 16 % — ABNORMAL HIGH (ref 11.5–15.5)
WBC: 8.6 K/uL (ref 4.0–10.5)
nRBC: 0 % (ref 0.0–0.2)

## 2023-12-23 LAB — TYPE AND SCREEN
ABO/RH(D): O POS
Antibody Screen: NEGATIVE
Unit division: 0

## 2023-12-23 LAB — ECHOCARDIOGRAM COMPLETE
AR max vel: 2.26 cm2
AV Area VTI: 2.69 cm2
AV Area mean vel: 2.3 cm2
AV Mean grad: 7 mmHg
AV Peak grad: 13.1 mmHg
Ao pk vel: 1.81 m/s
Area-P 1/2: 6.6 cm2
Height: 69 in
MV VTI: 2.58 cm2
S' Lateral: 3.1 cm
Weight: 1961.21 [oz_av]

## 2023-12-23 LAB — BPAM RBC
Blood Product Expiration Date: 202512282359
ISSUE DATE / TIME: 202511271038
Unit Type and Rh: 5100

## 2023-12-23 MED ORDER — METOPROLOL SUCCINATE ER 100 MG PO TB24
100.0000 mg | ORAL_TABLET | Freq: Every day | ORAL | Status: DC
  Administered 2023-12-24: 100 mg via ORAL
  Filled 2023-12-23: qty 1

## 2023-12-23 NOTE — Progress Notes (Signed)
 [facility]  ROUNDING NOTE   Subjective:   Patient well-known to us  from prior admissions. Came in with generalized weakness. Has missed several days of dialysis treatments.  Update: Patient resting comfortably in bed Peritoneal dialysis received yesterday, UF - with treatment.  Room air  Objective:  Vital signs in last 24 hours:  Temp:  [97.4 F (36.3 C)-98.3 F (36.8 C)] 98 F (36.7 C) (11/28 1001) Pulse Rate:  [80-98] 96 (11/28 1001) Resp:  [18-28] 18 (11/28 1001) BP: (133-178)/(64-92) 178/86 (11/28 1001) SpO2:  [91 %-97 %] 97 % (11/28 1001) Weight:  [55.6 kg] 55.6 kg (11/28 0428)  Weight change: -3.368 kg Filed Weights   12/21/23 1939 12/23/23 0428  Weight: 59 kg 55.6 kg    Intake/Output: I/O last 3 completed shifts: In: 610.5 [P.O.:120; I.V.:46.5; Blood:290; IV Piggyback:500] Out: 1150 [Urine:1150]   Intake/Output this shift:  Total I/O In: 120 [P.O.:120] Out: 425 [Urine:425]  Physical Exam: General: No acute distress  Head: Normocephalic, atraumatic. Moist oral mucosal membranes  Lungs:  Clear to auscultation, normal effort  Heart: S1S2 no rubs  Abdomen:  Soft, nontender, bowel sounds present  Extremities: No peripheral edema.  Neurologic: Awake, alert, following commands  Skin: No acute rash  Access: PD catheter in place    Basic Metabolic Panel: Recent Labs  Lab 12/21/23 2001 12/22/23 0456 12/23/23 0451  NA 139 143 143  K 5.8* 5.5* 4.9  CL 108 111 108  CO2 14* 16* 19*  GLUCOSE 90 73 86  BUN 73* 72* 61*  CREATININE 8.85* 8.75* 8.07*  CALCIUM  7.4* 7.0* 7.2*    Liver Function Tests: Recent Labs  Lab 12/21/23 2001  AST 16  ALT 24  ALKPHOS 169*  BILITOT 0.2  PROT 6.2*  ALBUMIN 3.3*   No results for input(s): LIPASE, AMYLASE in the last 168 hours. No results for input(s): AMMONIA in the last 168 hours.  CBC: Recent Labs  Lab 12/21/23 2001 12/22/23 0456 12/22/23 1501 12/23/23 0451  WBC 8.3 6.9  --   8.6  NEUTROABS 6.8  --   --   --   HGB 8.3* 6.7* 8.3* 8.6*  HCT 27.2* 21.6* 26.1* 26.2*  MCV 98.6 97.3  --  91.6  PLT 253 226  --  224    Cardiac Enzymes: No results for input(s): CKTOTAL, CKMB, CKMBINDEX, TROPONINI in the last 168 hours.  BNP: Invalid input(s): POCBNP  CBG: No results for input(s): GLUCAP in the last 168 hours.  Microbiology: Results for orders placed or performed in visit on 10/04/23  Microscopic Examination     Status: Abnormal   Collection Time: 10/04/23  1:05 PM   Urine  Result Value Ref Range Status   WBC, UA 6-10 (A) 0 - 5 /hpf Final   RBC, Urine 3-10 (A) 0 - 2 /hpf Final   Epithelial Cells (non renal) 0-10 0 - 10 /hpf Final   Bacteria, UA Few None seen/Few Final    Coagulation Studies: No results for input(s): LABPROT, INR in the last 72 hours.  Urinalysis: No results for input(s): COLORURINE, LABSPEC, PHURINE, GLUCOSEU, HGBUR, BILIRUBINUR, KETONESUR, PROTEINUR, UROBILINOGEN, NITRITE, LEUKOCYTESUR in the last 72 hours.  Invalid input(s): APPERANCEUR    Imaging: ECHOCARDIOGRAM COMPLETE Result Date: 12/23/2023    ECHOCARDIOGRAM REPORT   Patient Name:   Casey French Date of Exam: 12/23/2023 Medical Rec #:  [id number]           Height:       69.0 in Accession #:  [id number]          Weight:       122.6 lb Date of Birth:  [birth year]           BSA:          1.678 m Patient Age:    73 years            BP:           162/88 mmHg Patient Gender: M                   HR:           94 bpm. Exam Location:  [facility] Procedure: 2D Echo, Cardiac Doppler and Color Doppler (Both Spectral and Color            Flow Doppler were utilized during procedure). Indications:     Dyspnea R06.00  History:         Patient has prior history of Echocardiogram examinations, most                  recent 10/09/2022. Signs/Symptoms:Murmur.  Sonographer:     Christopher Furnace Referring Phys:  [id number] LANETA BLUNT Diagnosing Phys: Sabina Custovic  IMPRESSIONS  1. Left ventricular ejection fraction, by estimation, is 60 to 65%. The left ventricle has normal function. The left ventricle has no regional wall motion abnormalities. Left ventricular diastolic parameters are consistent with Grade II diastolic dysfunction (pseudonormalization).  2. Right ventricular systolic function is normal. The right ventricular size is normal.  3. The mitral valve is normal in structure. Mild mitral valve regurgitation. No evidence of mitral stenosis.  4. The aortic valve is normal in structure. Aortic valve regurgitation is not visualized. Aortic valve sclerosis/calcification is present, without any evidence of aortic stenosis.  5. The inferior vena cava is normal in size with greater than 50% respiratory variability, suggesting right atrial pressure of 3 mmHg. FINDINGS  Left Ventricle: Left ventricular ejection fraction, by estimation, is 60 to 65%. The left ventricle has normal function. The left ventricle has no regional wall motion abnormalities. The left ventricular internal cavity size was normal in size. There is  no left ventricular hypertrophy. Left ventricular diastolic parameters are consistent with Grade II diastolic dysfunction (pseudonormalization). Right Ventricle: The right ventricular size is normal. No increase in right ventricular wall thickness. Right ventricular systolic function is normal. Left Atrium: Left atrial size was normal in size. Right Atrium: Right atrial size was normal in size. Pericardium: There is no evidence of pericardial effusion. Mitral Valve: The mitral valve is normal in structure. Mild mitral valve regurgitation. No evidence of mitral valve stenosis. MV peak gradient, 24.6 mmHg. The mean mitral valve gradient is 12.0 mmHg. Tricuspid Valve: The tricuspid valve is normal in structure. Tricuspid valve regurgitation is mild. Aortic Valve: The aortic valve is normal in structure. Aortic valve regurgitation is not visualized. Aortic valve  sclerosis/calcification is present, without any evidence of aortic stenosis. Aortic valve mean gradient measures 7.0 mmHg. Aortic valve peak  gradient measures 13.1 mmHg. Aortic valve area, by VTI measures 2.69 cm. Pulmonic Valve: The pulmonic valve was normal in structure. Pulmonic valve regurgitation is not visualized. Aorta: The aortic root is normal in size and structure. Venous: The inferior vena cava is normal in size with greater than 50% respiratory variability, suggesting right atrial pressure of 3 mmHg. IAS/Shunts: No atrial level shunt detected by color flow Doppler.  LEFT VENTRICLE PLAX 2D LVIDd:  4.50 cm   Diastology LVIDs:         3.10 cm   LV e' medial:    7.62 cm/s LV PW:         1.00 cm   LV E/e' medial:  18.5 LV IVS:        1.00 cm   LV e' lateral:   9.25 cm/s LVOT diam:     2.00 cm   LV E/e' lateral: 15.2 LV SV:         89 LV SV Index:   53 LVOT Area:     3.14 cm LV IVRT:       98 msec  RIGHT VENTRICLE RV Basal diam:  2.10 cm     PULMONARY VEINS RV Mid diam:    2.40 cm     Diastolic Velocity: 47.20 cm/s RV S prime:     15.90 cm/s  S/D Velocity:       0.60 TAPSE (M-mode): 2.4 cm      Systolic Velocity:  28.10 cm/s LEFT ATRIUM           Index        RIGHT ATRIUM          Index LA diam:      2.80 cm 1.67 cm/m   RA Area:     7.26 cm LA Vol (A2C): 41.1 ml 24.49 ml/m  RA Volume:   9.54 ml  5.69 ml/m LA Vol (A4C): 47.7 ml 28.43 ml/m  AORTIC VALVE AV Area (Vmax):    2.26 cm AV Area (Vmean):   2.30 cm AV Area (VTI):     2.69 cm AV Vmax:           181.00 cm/s AV Vmean:          124.000 cm/s AV VTI:            0.330 m AV Peak Grad:      13.1 mmHg AV Mean Grad:      7.0 mmHg LVOT Vmax:         130.00 cm/s LVOT Vmean:        90.900 cm/s LVOT VTI:          0.283 m LVOT/AV VTI ratio: 0.86  AORTA Ao Root diam: 3.60 cm MITRAL VALVE                TRICUSPID VALVE MV Area (PHT): 6.60 cm     TR Peak grad:   26.4 mmHg MV Area VTI:   2.58 cm     TR Vmax:        257.00 cm/s MV Peak grad:  24.6 mmHg MV  Mean grad:  12.0 mmHg    SHUNTS MV Vmax:       2.48 m/s     Systemic VTI:  0.28 m MV Vmean:      161.0 cm/s   Systemic Diam: 2.00 cm MV Decel Time: 115 msec MV E velocity: 141.00 cm/s MV A velocity: 212.00 cm/s MV E/A ratio:  0.67 Sabina Custovic Electronically signed by Annalee Casa Signature Date/Time: 12/23/2023/9:42:24 AM    Final    DG Chest Portable 1 View Result Date: 12/21/2023 EXAM: 1 VIEW(S) XRAY OF THE CHEST 12/21/2023 08:04:00 PM COMPARISON: 09/02/2023 CLINICAL HISTORY: SOB FINDINGS: LUNGS AND PLEURA: Linear scarring at the right lung base. Biapical scarring. No pleural effusion. No pneumothorax. HEART AND MEDIASTINUM: Aortic atherosclerosis. No acute abnormality of the cardiac and mediastinal silhouettes. BONES AND SOFT TISSUES: No acute osseous abnormality.  IMPRESSION: 1. No acute cardiopulmonary process. Electronically signed by: Franky Crease MD 12/21/2023 08:09 PM EST RP Workstation: [id number]     Medications:    dialysis solution 1.5% low-MG/low-CA      amLODipine   10 mg Oral Daily   apixaban   2.5 mg Oral BID   aspirin  EC  81 mg Oral Daily   calcitRIOL   0.25 mcg Oral Daily   calcium  carbonate  2 tablet Oral TID WC   cyanocobalamin   1,000 mcg Oral Daily   folic acid   1 mg Oral Daily   gabapentin   300 mg Oral QHS   gentamicin  cream  1 Application Topical Daily   metoprolol  succinate  50 mg Oral Q breakfast   pantoprazole   40 mg Oral Daily   sodium bicarbonate   1,300 mg Oral BID   acetaminophen  **OR** acetaminophen , hydrALAZINE , HYDROcodone -acetaminophen , hydrocortisone , isosorbide  mononitrate, magnesium  hydroxide, nitroGLYCERIN , ondansetron  **OR** ondansetron  (ZOFRAN ) IV, traZODone   Assessment/ Plan:  73 y.o. male with past medical history of coronary artery disease, hypertension, hyperlipidemia, diabetes mellitus type 2, gout, GERD, ESRD on PD, anemia chronic kidney disease, secondary hyperparathyroidism who presented with generalized weakness and missed peritoneal  dialysis treatments.  1.  ESRD on PD.  Patient has missed several days of peritoneal dialysis due to generalized weakness.  PD received during daytime yesterday, UF - . Will continue nightly treatments, if remains inpatient. Will defer discharge plan to primary team.   2.  Hyperkalemia.  Serum potassium 4.9 today. Will continue to monitor for need of temporizing measures.   3.  Anemia of chronic kidney disease.  Hgb has improved to 8.6, after blood transfusion. Continue Mircera as outpatient.  4.  Secondary hyperparathyroidism.  Corrected calcium  7.8. Awaiting updated phos. Continue calcitriol  and calcium  carbonate.    LOS: 2 Casey French 11/28/202510:14 AM

## 2023-12-23 NOTE — Progress Notes (Signed)
*  PRELIMINARY RESULTS* Echocardiogram 2D Echocardiogram has been performed.  Floydene Harder 12/23/2023, 8:02 AM

## 2023-12-23 NOTE — Plan of Care (Signed)
  Problem: Clinical Measurements: Goal: Ability to maintain clinical measurements within normal limits will improve Outcome: Progressing Goal: Will remain free from infection Outcome: Progressing Goal: Diagnostic test results will improve Outcome: Progressing Goal: Cardiovascular complication will be avoided Outcome: Progressing   Problem: Activity: Goal: Risk for activity intolerance will decrease Outcome: Progressing   Problem: Nutrition: Goal: Adequate nutrition will be maintained Outcome: Progressing   Problem: Elimination: Goal: Will not experience complications related to urinary retention Outcome: Progressing   Problem: Pain Managment: Goal: General experience of comfort will improve and/or be controlled Outcome: Progressing   Problem: Safety: Goal: Ability to remain free from injury will improve Outcome: Progressing   Problem: Skin Integrity: Goal: Risk for impaired skin integrity will decrease Outcome: Progressing

## 2023-12-23 NOTE — TOC CM/SW Note (Signed)
 Transition of Care [facility]) CM/SW Note    Transition of Care [facility]) - Inpatient Brief Assessment   Patient Details  Name: Casey French MRN: [id number] Date of Birth: Mar 13, 1950  [facility] CM/SW Contact:    Alfonso Rummer, LCSW Phone Number: 12/23/2023, 10:23 AM   Clinical Narrative: Completed toc chart review. No toc needs identified. Please contact toc should needs arise.    Transition of Care Asessment: Insurance and Status: Insurance coverage has been reviewed Patient has primary care physician: Yes (HEDRICK, JAMES F) Home environment has been reviewed: single family  home   Prior/Current Home Services: No current home services Social Drivers of Health Review: SDOH reviewed no interventions necessary Readmission risk has been reviewed: No Transition of care needs: no transition of care needs at this time

## 2023-12-23 NOTE — Consult Note (Addendum)
 [facility] Cardiology  CARDIOLOGY CONSULT NOTE  Patient ID: Casey French MRN: [id number] DOB/AGE: [birth year] 73 y.o.  Admit date: 12/21/2023 Referring Physician Marsa Primary Physician [facility] Primary Cardiologist Paraschos Reason for Consultation chest pain, hyperkalemia d/t skipped PD  HPI: 73 year old gentleman referred for evaluation of chest pain and hyperkalemia due to missed PD. The patient has known history of coronary artery disease, paroxysmal atrial fibrillation, and essential hypertension, with end-stage renal disease on peritoneal dialysis.    Troponins found to be elevated in the setting of renal failure due to missed dialysis. Patient complaining of chest pain with breathing, not with exertion.   This morning patient is chest pain free. He is only complaining of back pain. We will plan to do outpatient Lexi as was previously ordered. He denies chest pain, shortness of breath, palpitations, diaphoresis, syncope.  Review of systems complete and found to be negative unless listed above     Past Medical History:  Diagnosis Date   Acute diverticulitis 06/14/2014   Acute ST elevation myocardial infarction (STEMI) of inferior wall (HCC) 2003   a.) PCI with Cypher stent to the RCA   Anemia of chronic renal failure    Aortic atherosclerosis    Bladder tumor    CAP (community acquired pneumonia) 06/2022   Cerebral microvascular disease    Coronary artery disease involving native coronary artery of native heart without angina pectoris 2003   a.) PCI with Cypher stent to the RCA 2003; b.) LHC 08/2003: 100% dRCA, 60% pLAD - med mgmt; c.) LHC/PCI 08/21/2015: occluded RCA at site of prior stent placement, 70% mLAD (FFR 0.79; 3.5 x 12 mm Xience DES), 50% dLAD (FFR 0.70); d.) MV 11/11/2016: no ischemia   Depression    Diverticulosis    End-stage renal disease on peritoneal dialysis (HCC)    GERD (gastroesophageal reflux disease)    GI bleed 05/15/2019   Gout    Heart murmur     Hematemesis 05/15/2019   Hepatic cyst    Hepatomegaly    Hyperlipidemia    Hypertension    Lacunar infarction (HCC) 02/11/2007   a.) CT head 02/11/2007: lacunar infarction involving thalamus and external capsule region on the RIGHT; b.) MRI brain 02/03/2020: multiple remote lacunar infarcts involving the RIGHT basal ganglia, RIGHT thalamus, and LEFT middle cerebellar peduncle   Long-term use of aspirin  therapy    Myocardial bridge    On apixaban  therapy    Pancolitis 09/2022   Paroxysmal atrial fibrillation (HCC)    a.) CHA2DS2VASc = 6 (age, HTN, CVA x 2, prior MI/vascular disease, T2DM) as of 03/11/2023; b.) rate/rhythm maintained on oral metoprolol  succinate; chronically anticoagulated with dose reduced apixaban    Renal cyst, right    Sigmoid diverticulitis 03/2022   T2DM (type 2 diabetes mellitus) (HCC)    Traumatic skull fracture and resulting subarachnoid hemorrhage 03/24/2013   a.) MOI was slipping and falling on ice   Unstable angina [facility])     Past Surgical History:  Procedure Laterality Date   BLADDER INSTILLATION N/A 04/05/2023   Procedure: BLADDER INSTILLATION OF GEMCITABINE ;  Surgeon: Twylla Glendia BROCKS, MD;  Location: [facility] ORS;  Service: Urology;  Laterality: N/A;   CAPD INSERTION N/A 08/13/2020   Procedure: LAPAROSCOPIC INSERTION CONTINUOUS AMBULATORY PERITONEAL DIALYSIS  (CAPD) CATHETER;  Surgeon: Lane Shope, MD;  Location: [facility] ORS;  Service: General;  Laterality: N/A;   CARDIAC CATHETERIZATION     CATARACT EXTRACTION W/PHACO Right 07/05/2019   Procedure: CATARACT EXTRACTION PHACO AND INTRAOCULAR LENS PLACEMENT (IOC) RIGHT  DIABETIC;  Surgeon: Jaye Fallow, MD;  Location: [facility] ORS;  Service: Ophthalmology;  Laterality: Right;  US00:59.3 CDE8.41 LOT [id number] h   COLONOSCOPY WITH PROPOFOL  N/A 04/01/2017   Procedure: COLONOSCOPY WITH PROPOFOL ;  Surgeon: Gaylyn Gladis PENNER, MD;  Location: [facility] ENDOSCOPY;  Service: Endoscopy;  Laterality: N/A;   CORONARY  ANGIOPLASTY WITH STENT PLACEMENT  10/2001   RCA   CORONARY ANGIOPLASTY WITH STENT PLACEMENT Left 08/21/2015   Procedure: CORONARY ANGIOPLASTY WITH STENT PLACEMENT (mid LAD); Location: [facility]; Surgeon: Zachary Car, MD   ESOPHAGOGASTRODUODENOSCOPY N/A 04/24/2023   Procedure: EGD (ESOPHAGOGASTRODUODENOSCOPY);  Surgeon: Toledo, Ladell POUR, MD;  Location: [facility] ENDOSCOPY;  Service: Gastroenterology;  Laterality: N/A;   HAND SURGERY  2015   3 screws in right hand   TOTAL HIP ARTHROPLASTY Left 04/22/2020   Procedure: TOTAL HIP ARTHROPLASTY ANTERIOR APPROACH;  Surgeon: Kathlynn Sharper, MD;  Location: [facility] ORS;  Service: Orthopedics;  Laterality: Left;   TRANSURETHRAL RESECTION OF BLADDER TUMOR N/A 04/05/2023   Procedure: TURBT (TRANSURETHRAL RESECTION OF BLADDER TUMOR);  Surgeon: Twylla Glendia BROCKS, MD;  Location: [facility] ORS;  Service: Urology;  Laterality: N/A;    Medications Prior to Admission  Medication Sig Dispense Refill Last Dose/Taking   acetaminophen  (TYLENOL ) 325 MG tablet Take 2 tablets (650 mg total) by mouth every 6 (six) hours as needed for mild pain (or Fever >/= 101).   Unknown   amLODipine  (NORVASC ) 10 MG tablet Take 10 mg by mouth daily.   Past Week   apixaban  (ELIQUIS ) 2.5 MG TABS tablet Take 2.5 mg by mouth 2 (two) times daily.   Past Month   aspirin  81 MG EC tablet Take 81 mg by mouth daily.   Unknown   calcium  carbonate (TUMS) 500 MG chewable tablet Chew 2 tablets by mouth 3 (three) times daily with meals.   Past Week   [EXPIRED] cyanocobalamin  1000 MCG tablet Take 1 tablet (1,000 mcg total) by mouth daily. 30 tablet 2 Past Week   gabapentin  (NEURONTIN ) 300 MG capsule Take 300 mg by mouth at bedtime.  11 Past Week   hydrocortisone  (ANUSOL -HC) 2.5 % rectal cream Place 1 application rectally 2 (two) times daily as needed for hemorrhoids or anal itching.   Past Week   nitroGLYCERIN  (NITROSTAT ) 0.4 MG SL tablet Place 0.4 mg under the tongue every 5 (five) minutes as needed for chest pain.    Unknown   ondansetron  (ZOFRAN ) 4 MG tablet Take 4 mg by mouth every 8 (eight) hours as needed for nausea or vomiting.   Past Month   pantoprazole  (PROTONIX ) 40 MG tablet Take 1 tablet (40 mg total) by mouth daily. 30 tablet 1 Past Week   amitriptyline (ELAVIL) 25 MG tablet Take 25 mg by mouth at bedtime. (Patient not taking: Reported on 12/22/2023)   Not Taking   [EXPIRED] calcitRIOL  (ROCALTROL ) 0.25 MCG capsule Take 1 capsule (0.25 mcg total) by mouth daily. 30 capsule 2    [EXPIRED] folic acid  (FOLVITE ) 1 MG tablet Take 1 tablet (1 mg total) by mouth daily. Take 1 tablet daily 30 tablet 2    HYDROcodone -acetaminophen  (NORCO/VICODIN) 5-325 MG tablet Take 1 tablet by mouth every 6 (six) hours as needed for moderate pain (pain score 4-6). (Patient not taking: Reported on 12/22/2023) 6 tablet 0 Not Taking   isosorbide  mononitrate (IMDUR ) 30 MG 24 hr tablet Take 1 tablet (30 mg total) by mouth daily as needed (Systolic BP greater than 140). 30 tablet 1    metoprolol  succinate (TOPROL -XL) 100 MG 24 hr tablet  Take 0.5 tablets (50 mg total) by mouth every morning. Take with or immediately following a meal. (Patient not taking: Reported on 12/22/2023) 30 tablet 1 Not Taking   sodium bicarbonate  650 MG tablet Take 2 tablets (1,300 mg total) by mouth 2 (two) times daily. (Patient not taking: Reported on 12/22/2023) 120 tablet 11 Not Taking   Social History   Socioeconomic History   Marital status: Married    Spouse name: Sherri   Number of children: 2   Years of education: Not on file   Highest education level: Not on file  Occupational History   Not on file  Tobacco Use   Smoking status: Former    Current packs/day: 0.00    Types: Cigarettes    Quit date: 03/24/2001    Years since quitting: 22.7   Smokeless tobacco: Never  Vaping Use   Vaping status: Never Used  Substance and Sexual Activity   Alcohol use: No    Alcohol/week: 0.0 standard drinks of alcohol   Drug use: No   Sexual activity:  Not on file  Other Topics Concern   Not on file  Social History Narrative   Lives with wife   Social Drivers of Health   Financial Resource Strain: Medium Risk (11/28/2023)   Received from [facility]   Overall Financial Resource Strain (CARDIA)    Difficulty of Paying Living Expenses: Somewhat hard  Food Insecurity: Food Insecurity Present (12/22/2023)   Hunger Vital Sign    Worried About Running Out of Food in the Last Year: Sometimes true    Ran Out of Food in the Last Year: Never true  Transportation Needs: No Transportation Needs (12/22/2023)   PRAPARE - Administrator, Civil Service (Medical): No    Lack of Transportation (Non-Medical): No  Physical Activity: Not on file  Stress: Not on file  Social Connections: Socially Isolated (12/22/2023)   Social Connection and Isolation Panel    Frequency of Communication with Friends and Family: Once a week    Frequency of Social Gatherings with Friends and Family: Never    Attends Religious Services: Never    Database Administrator or Organizations: No    Attends Banker Meetings: Never    Marital Status: Married  Catering Manager Violence: Not At Risk (12/22/2023)   Humiliation, Afraid, Rape, and Kick questionnaire    Fear of Current or Ex-Partner: No    Emotionally Abused: No    Physically Abused: No    Sexually Abused: No    Family History  Problem Relation Age of Onset   Heart disease Mother    Heart disease Father    CAD Other       Review of systems complete and found to be negative unless listed above      PHYSICAL EXAM  General: Well developed, well nourished, in no acute distress HEENT:  Normocephalic and atramatic Neck:  No JVD.  Lungs: Clear bilaterally to auscultation and percussion. Heart: HRRR . Normal S1 and S2 without gallops or murmurs.  Abdomen: Bowel sounds are positive, abdomen soft and non-tender  Msk:  Back normal, normal gait. Normal strength and  tone for age. Extremities: No clubbing, cyanosis or edema.   Neuro: Alert and oriented X 3. Psych:  Good affect, responds appropriately  Labs:   Lab Results  Component Value Date   WBC 8.6 12/23/2023   HGB 8.6 (L) 12/23/2023   HCT 26.2 (L) 12/23/2023   MCV 91.6 12/23/2023  PLT 224 12/23/2023    Recent Labs  Lab 12/21/23 2001 12/22/23 0456 12/23/23 0451  NA 139   < > 143  K 5.8*   < > 4.9  CL 108   < > 108  CO2 14*   < > 19*  BUN 73*   < > 61*  CREATININE 8.85*   < > 8.07*  CALCIUM  7.4*   < > 7.2*  PROT 6.2*  --   --   BILITOT 0.2  --   --   ALKPHOS 169*  --   --   ALT 24  --   --   AST 16  --   --   GLUCOSE 90   < > 86   < > = values in this interval not displayed.   Lab Results  Component Value Date   CKTOTAL 198 03/23/2013   CKMB 4.6 (H) 03/23/2013   TROPONINI <0.03 06/26/2018    Lab Results  Component Value Date   CHOL 142 11/10/2016   CHOL 194 09/27/2012   Lab Results  Component Value Date   HDL 45 11/10/2016   HDL 49 09/27/2012   Lab Results  Component Value Date   LDLCALC 75 11/10/2016   LDLCALC 98 09/27/2012   Lab Results  Component Value Date   TRIG 109 11/10/2016   TRIG 233 (H) 09/27/2012   Lab Results  Component Value Date   CHOLHDL 3.2 11/10/2016   No results found for: LDLDIRECT    Radiology: ECHOCARDIOGRAM COMPLETE Result Date: 12/23/2023    ECHOCARDIOGRAM REPORT   Patient Name:   WALT GEATHERS Date of Exam: 12/23/2023 Medical Rec #:  [id number]           Height:       69.0 in Accession #:    [id number]          Weight:       122.6 lb Date of Birth:  06-11-1950           BSA:          1.678 m Patient Age:    73 years            BP:           162/88 mmHg Patient Gender: M                   HR:           94 bpm. Exam Location:  [facility] Procedure: 2D Echo, Cardiac Doppler and Color Doppler (Both Spectral and Color            Flow Doppler were utilized during procedure). Indications:     Dyspnea R06.00  History:         Patient has  prior history of Echocardiogram examinations, most                  recent 10/09/2022. Signs/Symptoms:Murmur.  Sonographer:     Christopher Furnace Referring Phys:  [id number] LANETA BLUNT Diagnosing Phys: Murat Rideout IMPRESSIONS  1. Left ventricular ejection fraction, by estimation, is 60 to 65%. The left ventricle has normal function. The left ventricle has no regional wall motion abnormalities. Left ventricular diastolic parameters are consistent with Grade II diastolic dysfunction (pseudonormalization).  2. Right ventricular systolic function is normal. The right ventricular size is normal.  3. The mitral valve is normal in structure. Mild mitral valve regurgitation. No evidence of mitral stenosis.  4. The aortic valve is normal in structure. Aortic  valve regurgitation is not visualized. Aortic valve sclerosis/calcification is present, without any evidence of aortic stenosis.  5. The inferior vena cava is normal in size with greater than 50% respiratory variability, suggesting right atrial pressure of 3 mmHg. FINDINGS  Left Ventricle: Left ventricular ejection fraction, by estimation, is 60 to 65%. The left ventricle has normal function. The left ventricle has no regional wall motion abnormalities. The left ventricular internal cavity size was normal in size. There is  no left ventricular hypertrophy. Left ventricular diastolic parameters are consistent with Grade II diastolic dysfunction (pseudonormalization). Right Ventricle: The right ventricular size is normal. No increase in right ventricular wall thickness. Right ventricular systolic function is normal. Left Atrium: Left atrial size was normal in size. Right Atrium: Right atrial size was normal in size. Pericardium: There is no evidence of pericardial effusion. Mitral Valve: The mitral valve is normal in structure. Mild mitral valve regurgitation. No evidence of mitral valve stenosis. MV peak gradient, 24.6 mmHg. The mean mitral valve gradient is 12.0 mmHg.  Tricuspid Valve: The tricuspid valve is normal in structure. Tricuspid valve regurgitation is mild. Aortic Valve: The aortic valve is normal in structure. Aortic valve regurgitation is not visualized. Aortic valve sclerosis/calcification is present, without any evidence of aortic stenosis. Aortic valve mean gradient measures 7.0 mmHg. Aortic valve peak  gradient measures 13.1 mmHg. Aortic valve area, by VTI measures 2.69 cm. Pulmonic Valve: The pulmonic valve was normal in structure. Pulmonic valve regurgitation is not visualized. Aorta: The aortic root is normal in size and structure. Venous: The inferior vena cava is normal in size with greater than 50% respiratory variability, suggesting right atrial pressure of 3 mmHg. IAS/Shunts: No atrial level shunt detected by color flow Doppler.  LEFT VENTRICLE PLAX 2D LVIDd:         4.50 cm   Diastology LVIDs:         3.10 cm   LV e' medial:    7.62 cm/s LV PW:         1.00 cm   LV E/e' medial:  18.5 LV IVS:        1.00 cm   LV e' lateral:   9.25 cm/s LVOT diam:     2.00 cm   LV E/e' lateral: 15.2 LV SV:         89 LV SV Index:   53 LVOT Area:     3.14 cm LV IVRT:       98 msec  RIGHT VENTRICLE RV Basal diam:  2.10 cm     PULMONARY VEINS RV Mid diam:    2.40 cm     Diastolic Velocity: 47.20 cm/s RV S prime:     15.90 cm/s  S/D Velocity:       0.60 TAPSE (M-mode): 2.4 cm      Systolic Velocity:  28.10 cm/s LEFT ATRIUM           Index        RIGHT ATRIUM          Index LA diam:      2.80 cm 1.67 cm/m   RA Area:     7.26 cm LA Vol (A2C): 41.1 ml 24.49 ml/m  RA Volume:   9.54 ml  5.69 ml/m LA Vol (A4C): 47.7 ml 28.43 ml/m  AORTIC VALVE AV Area (Vmax):    2.26 cm AV Area (Vmean):   2.30 cm AV Area (VTI):     2.69 cm AV Vmax:  181.00 cm/s AV Vmean:          124.000 cm/s AV VTI:            0.330 m AV Peak Grad:      13.1 mmHg AV Mean Grad:      7.0 mmHg LVOT Vmax:         130.00 cm/s LVOT Vmean:        90.900 cm/s LVOT VTI:          0.283 m LVOT/AV VTI ratio:  0.86  AORTA Ao Root diam: 3.60 cm MITRAL VALVE                TRICUSPID VALVE MV Area (PHT): 6.60 cm     TR Peak grad:   26.4 mmHg MV Area VTI:   2.58 cm     TR Vmax:        257.00 cm/s MV Peak grad:  24.6 mmHg MV Mean grad:  12.0 mmHg    SHUNTS MV Vmax:       2.48 m/s     Systemic VTI:  0.28 m MV Vmean:      161.0 cm/s   Systemic Diam: 2.00 cm MV Decel Time: 115 msec MV E velocity: 141.00 cm/s MV A velocity: 212.00 cm/s MV E/A ratio:  0.67 Knute Mazzuca Electronically signed by Annalee Casa Signature Date/Time: 12/23/2023/9:42:24 AM    Final    DG Chest Portable 1 View Result Date: 12/21/2023 EXAM: 1 VIEW(S) XRAY OF THE CHEST 12/21/2023 08:04:00 PM COMPARISON: 09/02/2023 CLINICAL HISTORY: SOB FINDINGS: LUNGS AND PLEURA: Linear scarring at the right lung base. Biapical scarring. No pleural effusion. No pneumothorax. HEART AND MEDIASTINUM: Aortic atherosclerosis. No acute abnormality of the cardiac and mediastinal silhouettes. BONES AND SOFT TISSUES: No acute osseous abnormality. IMPRESSION: 1. No acute cardiopulmonary process. Electronically signed by: Franky Crease MD 12/21/2023 08:09 PM EST RP Workstation: [id number]    EKG:   ASSESSMENT AND PLAN:   Chest pain atypical Elevation troponin due to demand ischemia in setting of ESRD  Echo is unchanged compared to prior. No wall motion abnormality or reduced LVEF. Seen in office by Dr Ammon earlier this week, echo and stress test ordered to evaluate for chest pain. Patient in the meantime sent to ED for hyperkalemia after missing dialysis. No plans for invasive procedures Will obtain outpatient Lexi as previously planned. No need to further trend trops Stable for discharge home from cardiac standpoint.   Paroxysmal Afib Restart Eliquis  once hgb stable Continue metoprolol     Coronary artery disease Restart ASA once hgb stable Continue beta-blocker, Imdur  and statin History of inferior STEMI, Cypher stent RCA 11/22/2001.cardiac  catheterization 09/03/2003 revealed occluded distal RCA with residual 60% stenosis proximal LAD.  The patient underwent PCI with 3.5 x 12 mm Xience Alpine DES mid LAD 07/21/2015.    Essential hypertension Restart home meds as BP allows   HLD On statin  Anemia of CKD S/P 1 unit pRBCs Primary team managing  ESRD Hyperkalemia d/t missed dialysis Nephrology following   Signed: Annalee Bolden Hagerman DO 12/23/2023, 12:18 PM

## 2023-12-23 NOTE — Progress Notes (Signed)
*  PRELIMINARY RESULTS* Echocardiogram 2D Echocardiogram has been performed.  Floydene Harder 12/23/2023, 8:01 AM

## 2023-12-23 NOTE — Progress Notes (Signed)
 Mobility Specialist - Progress Note    12/23/23 1300  Mobility  Activity Ambulated with assistance;Stood at bedside  Level of Assistance Contact guard assist, steadying assist  Assistive Device Front wheel walker  Distance Ambulated (ft) 40 ft  Range of Motion/Exercises Active;All extremities  Activity Response Tolerated well  Mobility visit 1 Mobility  Mobility Specialist Start Time (ACUTE ONLY) 1321  Mobility Specialist Stop Time (ACUTE ONLY) 1336  Mobility Specialist Time Calculation (min) (ACUTE ONLY) 15 min   Pt was supine in bed on RA upon entry. Pt agreed to mobility. Pt is able today to get to the EOB independently with bed features. Pt is able today to STS independently with a 2 WW. Pt ambulated well. Pt did need a recovery break to check vitals. After activity pt returned to the room in bed with needs in reach and bed alarm on.  Clem Rodes Mobility Specialist 12/23/23, 1:50 PM

## 2023-12-23 NOTE — Progress Notes (Signed)
 PROGRESS NOTE    Casey French   FMW:[id number] DOB: 20-Dec-1950  DOA: 12/21/2023 Date of Service: 12/23/23 which is hospital day 2  PCP: Valora Lynwood FALCON, MD    Hospital course / significant events:   HPI: Casey French is a 73 y.o. Caucasian male with medical history significant for coronary artery disease, hypertension, dyslipidemia and type 2 diabetes mellitus, gout and GERD, who presented to the emergency room with worsening generalized weakness, significantly worse over the last couple of days.  The patient missed his peritoneal dialysis over the last couple of days.  He reported nausea and vomiting with mild diarrhea.  Advised to come to the ED d/t high K on labs at his cardiologist   11/26: to ED. K 5.8. Given lokelma , also 500 cc bolus NS, sodium bicarb. Admitted to hospitalist.  11/27: reports persistent chest pain / pressure, repeating troponin EKG. Got dialysis today. Hgb drop, got 1 unit PRBC no bleeding and HH improved, cautious restart ASA/plavix.  11/28: troponin up, echo no concerns. Cardiology consult - nothing invasive planned and tropes though up are not concerning for ACS. Keep another night to ensure effective PD, debility will request PT/OT see him      Consultants:  Nephrology  Cardiology   Procedures/Surgeries: none      ASSESSMENT & PLAN:   Generalized weakness. Treat underlying cause(s) as below  Chest pain atypical Elevation troponin due to demand ischemia in setting of ESRD + gastroenteritis  Repeating troponin expect will be high w/ ESRD but looking at trend EKG.  Echo  ESRD Hyperkalemia d/t missed dialysis Worsening uremia with end-stage renal disease  Got dialysis today. Nephrology to follow Monitor BMP  Mild gastroenteritis  Supportive symptomatic care   Acute anemia on anemia chronic disease, no evidence for bleeding, likely is dilutional component Hgb at admission 8.3 on 12/21/23 --> 6.7 on 12/22/23   Given drop to  <7 will order 1 unit PRBC Follow HH closely  HH improved and no bleeding, cautious restart ASA/Eliquis     Essential hypertension Lower BP this morning but not hypotensive, given amenia will hold amlodipine  --> BP higher afternoon restart amlodpinine and added hydralazine  prn  Continue metoprolol  given Afib to avoid rebound tachycardia  HLD Statin  Paroxysmal Afib Anemia --> hold Eliquis  --> restart Continue metoprolol    Coronary artery disease Anemia --> hold ASA --> restart  Continue beta-blocker, Imdur  and statin    Low back pain Pain mgt   GERD without esophagitis PPI      Borderline underweight based on BMI: Body mass index is 18.1 kg/m.Casey French Significantly low or high BMI is associated with higher medical risk.  Underweight - under 18  overweight - 25 to 29 obese - 30 or more Class 1 obesity: BMI of 30.0 to 34 Class 2 obesity: BMI of 35.0 to 39 Class 3 obesity: BMI of 40.0 to 49 Super Morbid Obesity: BMI 50-59 Super-super Morbid Obesity: BMI 60+ Healthy nutrition and physical activity advised as adjunct to other disease management and risk reduction treatments    DVT prophylaxis: eliquis  IV fluids: no continuous IV fluids  Nutrition: renal Central lines / other devices: none  Code Status: DNR ACP documentation reviewed:  none on file in [facility]  TOC needs: TBD Medical barriers to dispo: anemai, hyperk, chest pain, pend echo. Expected medical readiness for discharge 1-2 days.              Subjective / Brief ROS:  Patient reports chest tightness w/ deep  breath, better but still there Denies SOB.  Pain controlled.  Denies new weakness.  Tolerating diet.    Family Communication: none at bedside     Objective Findings:  Vitals:   12/23/23 0039 12/23/23 0427 12/23/23 0428 12/23/23 1001  BP: (!) 160/88 (!) 162/88  (!) 178/86  Pulse: 98 94  96  Resp: 19 19  18   Temp: 98.2 F (36.8 C) 98.3 F (36.8 C)  98 F (36.7 C)  TempSrc:  Oral  Oral   SpO2: 91% 94%  97%  Weight:   55.6 kg   Height:        Intake/Output Summary (Last 24 hours) at 12/23/2023 1354 Last data filed at 12/23/2023 1013 Gross per 24 hour  Intake -59.5 ml  Output 1575 ml  Net -1634.5 ml   Filed Weights   12/21/23 1939 12/23/23 0428  Weight: 59 kg 55.6 kg    Examination:  Physical Exam Constitutional:      General: He is not in acute distress.    Appearance: He is ill-appearing.  Cardiovascular:     Rate and Rhythm: Normal rate and regular rhythm.  Pulmonary:     Effort: Pulmonary effort is normal.     Breath sounds: No rales.  Musculoskeletal:     Right lower leg: No edema.     Left lower leg: No edema.  Skin:    General: Skin is warm and dry.  Neurological:     Mental Status: He is alert and oriented to person, place, and time. Mental status is at baseline.          Scheduled Medications:   amLODipine   10 mg Oral Daily   apixaban   2.5 mg Oral BID   aspirin  EC  81 mg Oral Daily   calcitRIOL   0.25 mcg Oral Daily   calcium  carbonate  2 tablet Oral TID WC   cyanocobalamin   1,000 mcg Oral Daily   folic acid   1 mg Oral Daily   gabapentin   300 mg Oral QHS   gentamicin  cream  1 Application Topical Daily   [START ON 12/24/2023] metoprolol  succinate  100 mg Oral Q breakfast   pantoprazole   40 mg Oral Daily   sodium bicarbonate   1,300 mg Oral BID    Continuous Infusions:  dialysis solution 1.5% low-MG/low-CA      PRN Medications:  acetaminophen  **OR** acetaminophen , hydrALAZINE , HYDROcodone -acetaminophen , hydrocortisone , isosorbide  mononitrate, magnesium  hydroxide, nitroGLYCERIN , ondansetron  **OR** ondansetron  (ZOFRAN ) IV, traZODone   Antimicrobials from admission:  Anti-infectives (From admission, onward)    None           Data Reviewed:  I have personally reviewed the following...  CBC: Recent Labs  Lab 12/21/23 2001 12/22/23 0456 12/22/23 1501 12/23/23 0451  WBC 8.3 6.9  --  8.6  NEUTROABS 6.8  --   --   --    HGB 8.3* 6.7* 8.3* 8.6*  HCT 27.2* 21.6* 26.1* 26.2*  MCV 98.6 97.3  --  91.6  PLT 253 226  --  224   Basic Metabolic Panel: Recent Labs  Lab 12/21/23 2001 12/22/23 0456 12/23/23 0451  NA 139 143 143  K 5.8* 5.5* 4.9  CL 108 111 108  CO2 14* 16* 19*  GLUCOSE 90 73 86  BUN 73* 72* 61*  CREATININE 8.85* 8.75* 8.07*  CALCIUM  7.4* 7.0* 7.2*  PHOS  --   --  8.4*   GFR: Estimated Creatinine Clearance: 6.5 mL/min (A) (by C-G formula based on SCr of 8.07 mg/dL (H)). Liver  Function Tests: Recent Labs  Lab 12/21/23 2001  AST 16  ALT 24  ALKPHOS 169*  BILITOT 0.2  PROT 6.2*  ALBUMIN 3.3*   No results for input(s): LIPASE, AMYLASE in the last 168 hours. No results for input(s): AMMONIA in the last 168 hours. Coagulation Profile: No results for input(s): INR, PROTIME in the last 168 hours. Cardiac Enzymes: No results for input(s): CKTOTAL, CKMB, CKMBINDEX, TROPONINI in the last 168 hours. BNP (last 3 results) No results for input(s): PROBNP in the last 8760 hours. HbA1C: No results for input(s): HGBA1C in the last 72 hours. CBG: No results for input(s): GLUCAP in the last 168 hours. Lipid Profile: No results for input(s): CHOL, HDL, LDLCALC, TRIG, CHOLHDL, LDLDIRECT in the last 72 hours. Thyroid  Function Tests: No results for input(s): TSH, T4TOTAL, FREET4, T3FREE, THYROIDAB in the last 72 hours. Anemia Panel: No results for input(s): VITAMINB12, FOLATE, FERRITIN, TIBC, IRON , RETICCTPCT in the last 72 hours. Most Recent Urinalysis On File:     Component Value Date/Time   COLORURINE YELLOW (A) 04/22/2023 1952   APPEARANCEUR Clear 10/04/2023 1305   LABSPEC 1.020 04/22/2023 1952   PHURINE 7.0 04/22/2023 1952   GLUCOSEU 1+ (A) 10/04/2023 1305   HGBUR MODERATE (A) 04/22/2023 1952   BILIRUBINUR Negative 10/04/2023 1305   KETONESUR NEGATIVE 04/22/2023 1952   PROTEINUR 3+ (A) 10/04/2023 1305   PROTEINUR >=300 (A)  04/22/2023 1952   NITRITE Negative 10/04/2023 1305   NITRITE NEGATIVE 04/22/2023 1952   LEUKOCYTESUR Negative 10/04/2023 1305   LEUKOCYTESUR NEGATIVE 04/22/2023 1952   Sepsis Labs: @LABRCNTIP (procalcitonin:4,lacticidven:4) Microbiology: No results found for this or any previous visit (from the past 240 hours).    Radiology Studies last 3 days: ECHOCARDIOGRAM COMPLETE Result Date: 12/23/2023    ECHOCARDIOGRAM REPORT   Patient Name:   NAJI MEHRINGER Date of Exam: 12/23/2023 Medical Rec #:  [id number]           Height:       69.0 in Accession #:    [id number]          Weight:       122.6 lb Date of Birth:  05/21/1950           BSA:          1.678 m Patient Age:    73 years            BP:           162/88 mmHg Patient Gender: M                   HR:           94 bpm. Exam Location:  [facility] Procedure: 2D Echo, Cardiac Doppler and Color Doppler (Both Spectral and Color            Flow Doppler were utilized during procedure). Indications:     Dyspnea R06.00  History:         Patient has prior history of Echocardiogram examinations, most                  recent 10/09/2022. Signs/Symptoms:Murmur.  Sonographer:     Christopher Furnace Referring Phys:  [id number] LANETA BLUNT Diagnosing Phys: Sabina Custovic IMPRESSIONS  1. Left ventricular ejection fraction, by estimation, is 60 to 65%. The left ventricle has normal function. The left ventricle has no regional wall motion abnormalities. Left ventricular diastolic parameters are consistent with Grade II diastolic dysfunction (pseudonormalization).  2. Right ventricular systolic function is  normal. The right ventricular size is normal.  3. The mitral valve is normal in structure. Mild mitral valve regurgitation. No evidence of mitral stenosis.  4. The aortic valve is normal in structure. Aortic valve regurgitation is not visualized. Aortic valve sclerosis/calcification is present, without any evidence of aortic stenosis.  5. The inferior vena cava is normal in size  with greater than 50% respiratory variability, suggesting right atrial pressure of 3 mmHg. FINDINGS  Left Ventricle: Left ventricular ejection fraction, by estimation, is 60 to 65%. The left ventricle has normal function. The left ventricle has no regional wall motion abnormalities. The left ventricular internal cavity size was normal in size. There is  no left ventricular hypertrophy. Left ventricular diastolic parameters are consistent with Grade II diastolic dysfunction (pseudonormalization). Right Ventricle: The right ventricular size is normal. No increase in right ventricular wall thickness. Right ventricular systolic function is normal. Left Atrium: Left atrial size was normal in size. Right Atrium: Right atrial size was normal in size. Pericardium: There is no evidence of pericardial effusion. Mitral Valve: The mitral valve is normal in structure. Mild mitral valve regurgitation. No evidence of mitral valve stenosis. MV peak gradient, 24.6 mmHg. The mean mitral valve gradient is 12.0 mmHg. Tricuspid Valve: The tricuspid valve is normal in structure. Tricuspid valve regurgitation is mild. Aortic Valve: The aortic valve is normal in structure. Aortic valve regurgitation is not visualized. Aortic valve sclerosis/calcification is present, without any evidence of aortic stenosis. Aortic valve mean gradient measures 7.0 mmHg. Aortic valve peak  gradient measures 13.1 mmHg. Aortic valve area, by VTI measures 2.69 cm. Pulmonic Valve: The pulmonic valve was normal in structure. Pulmonic valve regurgitation is not visualized. Aorta: The aortic root is normal in size and structure. Venous: The inferior vena cava is normal in size with greater than 50% respiratory variability, suggesting right atrial pressure of 3 mmHg. IAS/Shunts: No atrial level shunt detected by color flow Doppler.  LEFT VENTRICLE PLAX 2D LVIDd:         4.50 cm   Diastology LVIDs:         3.10 cm   LV e' medial:    7.62 cm/s LV PW:         1.00 cm    LV E/e' medial:  18.5 LV IVS:        1.00 cm   LV e' lateral:   9.25 cm/s LVOT diam:     2.00 cm   LV E/e' lateral: 15.2 LV SV:         89 LV SV Index:   53 LVOT Area:     3.14 cm LV IVRT:       98 msec  RIGHT VENTRICLE RV Basal diam:  2.10 cm     PULMONARY VEINS RV Mid diam:    2.40 cm     Diastolic Velocity: 47.20 cm/s RV S prime:     15.90 cm/s  S/D Velocity:       0.60 TAPSE (M-mode): 2.4 cm      Systolic Velocity:  28.10 cm/s LEFT ATRIUM           Index        RIGHT ATRIUM          Index LA diam:      2.80 cm 1.67 cm/m   RA Area:     7.26 cm LA Vol (A2C): 41.1 ml 24.49 ml/m  RA Volume:   9.54 ml  5.69 ml/m LA Vol (A4C): 47.7 ml  28.43 ml/m  AORTIC VALVE AV Area (Vmax):    2.26 cm AV Area (Vmean):   2.30 cm AV Area (VTI):     2.69 cm AV Vmax:           181.00 cm/s AV Vmean:          124.000 cm/s AV VTI:            0.330 m AV Peak Grad:      13.1 mmHg AV Mean Grad:      7.0 mmHg LVOT Vmax:         130.00 cm/s LVOT Vmean:        90.900 cm/s LVOT VTI:          0.283 m LVOT/AV VTI ratio: 0.86  AORTA Ao Root diam: 3.60 cm MITRAL VALVE                TRICUSPID VALVE MV Area (PHT): 6.60 cm     TR Peak grad:   26.4 mmHg MV Area VTI:   2.58 cm     TR Vmax:        257.00 cm/s MV Peak grad:  24.6 mmHg MV Mean grad:  12.0 mmHg    SHUNTS MV Vmax:       2.48 m/s     Systemic VTI:  0.28 m MV Vmean:      161.0 cm/s   Systemic Diam: 2.00 cm MV Decel Time: 115 msec MV E velocity: 141.00 cm/s MV A velocity: 212.00 cm/s MV E/A ratio:  0.67 Sabina Custovic Electronically signed by Annalee Casa Signature Date/Time: 12/23/2023/9:42:24 AM    Final    DG Chest Portable 1 View Result Date: 12/21/2023 EXAM: 1 VIEW(S) XRAY OF THE CHEST 12/21/2023 08:04:00 PM COMPARISON: 09/02/2023 CLINICAL HISTORY: SOB FINDINGS: LUNGS AND PLEURA: Linear scarring at the right lung base. Biapical scarring. No pleural effusion. No pneumothorax. HEART AND MEDIASTINUM: Aortic atherosclerosis. No acute abnormality of the cardiac and mediastinal  silhouettes. BONES AND SOFT TISSUES: No acute osseous abnormality. IMPRESSION: 1. No acute cardiopulmonary process. Electronically signed by: Franky Crease MD 12/21/2023 08:09 PM EST RP Workstation: [id number]       Time spent: 50 min     Jasmon Graffam, DO [facility] 12/23/2023, 1:54 PM    Dictation software may have been used to generate the above note. Typos may occur and escape review in typed/dictated notes. Please contact Dr Marsa directly for clarity if needed.  Staff may message me via secure chat in Epic  but this may not receive an immediate response,  please page me for urgent matters!  If 7PM-7AM, please contact night coverage [email or web address]

## 2023-12-23 NOTE — Care Management Important Message (Signed)
 Important Message  Patient Details  Name: Casey French MRN: [id number] Date of Birth: [birth year]   Important Message Given:  Yes - Medicare IM     [name] [name] 12/23/2023, 5:13 PM

## 2023-12-24 ENCOUNTER — Other Ambulatory Visit: Payer: Self-pay

## 2023-12-24 ENCOUNTER — Encounter: Payer: Self-pay | Admitting: Oncology

## 2023-12-24 DIAGNOSIS — R531 Weakness: Secondary | ICD-10-CM | POA: Diagnosis not present

## 2023-12-24 MED ORDER — SCOPOLAMINE 1 MG/3DAYS TD PT72
1.0000 | MEDICATED_PATCH | TRANSDERMAL | Status: DC
Start: 2023-12-24 — End: 2023-12-24
  Administered 2023-12-24: 1 mg via TRANSDERMAL
  Filled 2023-12-24: qty 1

## 2023-12-24 MED ORDER — PROMETHAZINE HCL 25 MG PO TABS
25.0000 mg | ORAL_TABLET | Freq: Three times a day (TID) | ORAL | 0 refills | Status: DC | PRN
  Filled 2023-12-24: qty 20, 7d supply, fill #0

## 2023-12-24 MED ORDER — FOLIC ACID 1 MG PO TABS
1.0000 mg | ORAL_TABLET | Freq: Every day | ORAL | 0 refills | Status: AC
  Filled 2023-12-24: qty 30, 30d supply, fill #0

## 2023-12-24 MED ORDER — CALCITRIOL 0.25 MCG PO CAPS
0.2500 ug | ORAL_CAPSULE | Freq: Every day | ORAL | 0 refills | Status: AC
  Filled 2023-12-24: qty 30, 30d supply, fill #0

## 2023-12-24 MED ORDER — ACETAMINOPHEN 325 MG PO TABS: 650.0000 mg | ORAL_TABLET | Freq: Four times a day (QID) | ORAL | Status: DC | PRN

## 2023-12-24 MED ORDER — CYANOCOBALAMIN 500 MCG PO TABS
500.0000 ug | ORAL_TABLET | Freq: Every day | ORAL | 0 refills | Status: AC
  Filled 2023-12-24: qty 30, 30d supply, fill #0

## 2023-12-24 MED ORDER — SODIUM BICARBONATE 650 MG PO TABS
1300.0000 mg | ORAL_TABLET | Freq: Two times a day (BID) | ORAL | 0 refills | Status: AC
  Filled 2023-12-24: qty 120, 30d supply, fill #0

## 2023-12-24 MED ORDER — METOPROLOL SUCCINATE ER 100 MG PO TB24
100.0000 mg | ORAL_TABLET | Freq: Every day | ORAL | 0 refills | Status: DC
  Filled 2023-12-24: qty 30, 30d supply, fill #0

## 2023-12-24 NOTE — Plan of Care (Signed)

## 2023-12-24 NOTE — Progress Notes (Signed)
   12/24/23 0717  Peritoneal Catheter Left lower abdomen Continuous ambulatory  Placement Date/Time: 08/13/20 1246   Procedural Verification: Medical records & consent reviewed  Person Inserting LDA: Dr Derrill  Catheter Location: Left lower abdomen  Catheter Conversion: Extension added  Serial / Lot #: [id number]  Dialysis Type: C...  Site Assessment Clean, Dry, Intact;Clean;Dry  Drainage Description None  Catheter status Deaccessed  Dressing Occlusive  Dressing Status Clean, Dry, Intact;Clean;Dry  Dressing Intervention Assessed, no intervention needed  Completion  Treatment Status Complete  Initial Drain Volume 43  Average Dwell Time-Hour(s) 1  Average Dwell Time-Min(s) 30  Average Drain Time 83  Total Therapy Volume 8002  Total Therapy Time-Hour(s) 12  Total Therapy Time-Min(s) 17  Weight after Drain 122 lb 2.2 oz (55.4 kg)  Effluent Appearance Clear;Yellow  Fluid Balance - CCPD  Total UF (- value on cycler, pt gain) 440 mL  Procedure Comments  Tolerated treatment well? Yes  Hand-off documentation  Hand-off Given Given to shift RN/LPN  Hand-off Received Received from shift RN/LPN  Report received from (Full Name) Joane Click

## 2023-12-24 NOTE — Progress Notes (Signed)
 Discharge instructions given to and reviewed with patient. Patient verbalized understanding of all discharge instructions including changes to medications, new prescription and when to call the doctor. Meds to bed complete. Patient's wife coming to pick him up.

## 2023-12-24 NOTE — Discharge Summary (Signed)
 Physician Discharge Summary   Patient: Casey French MRN: [id number]  DOB: 01/26/1951   Admit:     Date of Admission: 12/21/2023 Admitted from: home   Discharge: Date of discharge: 12/24/23 Disposition: Home Condition at discharge: good  CODE STATUS: DNR     Discharge Physician: Laneta Blunt, DO [facility]     PCP: Valora Lynwood FALCON, MD  Recommendations for Outpatient Follow-up:  Follow up with PCP Valora Lynwood FALCON, MD in 1-2 weeks        Discharge Diagnoses: Principal Problem:   Generalized weakness Active Problems:   Essential hypertension   Dyslipidemia   Coronary artery disease   GERD without esophagitis   Paroxysmal atrial fibrillation (HCC)   Low back pain       Hospital course / significant events:   HPI: Casey French is a 73 y.o. Caucasian male with medical history significant for coronary artery disease, hypertension, dyslipidemia and type 2 diabetes mellitus, gout and GERD, who presented to the emergency room with worsening generalized weakness, significantly worse over the last couple of days.  The patient missed his peritoneal dialysis over the last couple of days.  He reported nausea and vomiting with mild diarrhea.  Advised to come to the ED d/t high K on labs at his cardiologist   11/26: to ED. K 5.8. Given lokelma , also 500 cc bolus NS, sodium bicarb. Admitted to hospitalist.  11/27: reports persistent chest pain / pressure, repeating troponin EKG. Got dialysis today. Hgb drop, got 1 unit PRBC no bleeding and HH improved, cautious restart ASA/plavix.  11/28: troponin up, echo no concerns. Cardiology consult - nothing invasive planned and tropes though up are not concerning for ACS. Keep another night to ensure effective PD, debility will request PT/OT see him  11/29: PT/OT amb w/ pt and doing well, still some nausea but pt feeling overall better, no CP, ready to go home      Consultants:  Nephrology   Cardiology   Procedures/Surgeries: none      ASSESSMENT & PLAN:   Generalized weakness. Treat underlying cause(s) as below  Chest pain atypical Elevation troponin due to demand ischemia in setting of ESRD + gastroenteritis  Follow for stress test w/ cardiology   ESRD Hyperkalemia d/t missed dialysis Worsening uremia with end-stage renal disease  Follow as directed w nephrology Advised pt If he is sick enough to miss dialysis he is sick enough to come to the ED - please seek care in the future rather than skip dialysis   Mild gastroenteritis  Supportive symptomatic care  Scopolamine patch placed here can remove this in a few days Phenergan  for breakthrough nausea If still symptoms seek care outpatient / ED   Acute anemia on anemia chronic disease, no evidence for bleeding, likely is dilutional component Hgb at admission 8.3 on 12/21/23 --> 6.7 on 12/22/23   We ordered 1 unit PRBC HH improved and no bleeding, cautious restart Eliquis  and continue hold ASA for now given fall risk will avoid combination pending further clinical improvement / outpatient follow up   Essential hypertension Lower BP intially d/t illness resyme/adjusted home meds as below Continue metoprolol  given Afib to avoid rebound tachycardia  HLD Statin  Paroxysmal Afib Anemia --> hold Eliquis  --> restart Continue metoprolol    Coronary artery disease Anemia --> hold ASA --> restart  Continue beta-blocker, Imdur  and statin    Low back pain Pain mgt   GERD without esophagitis PPI  Borderline underweight based on BMI: Body mass index is 18.1 kg/m.SABRA Significantly low or high BMI is associated with higher medical risk.  Underweight - under 18  overweight - 25 to 29 obese - 30 or more Class 1 obesity: BMI of 30.0 to 34 Class 2 obesity: BMI of 35.0 to 39 Class 3 obesity: BMI of 40.0 to 49 Super Morbid Obesity: BMI 50-59 Super-super Morbid Obesity: BMI 60+ Healthy nutrition and  physical activity advised as adjunct to other disease management and risk reduction treatments                Discharge Instructions  Allergies as of 12/24/2023       Reactions   Morphine  Anxiety   agitation        Medication List     STOP taking these medications    amitriptyline 25 MG tablet Commonly known as: ELAVIL   HYDROcodone -acetaminophen  5-325 MG tablet Commonly known as: NORCO/VICODIN   ondansetron  4 MG tablet Commonly known as: ZOFRAN        TAKE these medications    acetaminophen  325 MG tablet Commonly known as: TYLENOL  Take 2 tablets (650 mg total) by mouth every 6 (six) hours as needed for mild pain (or Fever >/= 101). What changed: Another medication with the same name was added. Make sure you understand how and when to take each.   acetaminophen  325 MG tablet Commonly known as: TYLENOL  Take 2 tablets (650 mg total) by mouth every 6 (six) hours as needed for mild pain (pain score 1-3) or fever (or Fever >/= 101). What changed: You were already taking a medication with the same name, and this prescription was added. Make sure you understand how and when to take each.   amLODipine  10 MG tablet Commonly known as: NORVASC  Take 10 mg by mouth daily.   apixaban  2.5 MG Tabs tablet Commonly known as: ELIQUIS  Take 2.5 mg by mouth 2 (two) times daily.   aspirin  EC 81 MG tablet Take 81 mg by mouth daily.   calcitRIOL  0.25 MCG capsule Commonly known as: ROCALTROL  Take 1 capsule (0.25 mcg total) by mouth daily.   cyanocobalamin  500 MCG tablet Commonly known as: VITAMIN B12 Take 1 tablet (500 mcg total) by mouth daily. What changed:  medication strength how much to take   folic acid  1 MG tablet Commonly known as: FOLVITE  Take 1 tablet (1 mg total) by mouth daily. Take 1 tablet daily   gabapentin  300 MG capsule Commonly known as: NEURONTIN  Take 300 mg by mouth at bedtime.   hydrocortisone  2.5 % rectal cream Commonly known as:  ANUSOL -HC Place 1 application rectally 2 (two) times daily as needed for hemorrhoids or anal itching.   isosorbide  mononitrate 30 MG 24 hr tablet Commonly known as: IMDUR  Take 1 tablet (30 mg total) by mouth daily as needed (Systolic BP greater than 140).   metoprolol  succinate 100 MG 24 hr tablet Commonly known as: TOPROL -XL Take 1 tablet (100 mg total) by mouth daily with breakfast. Take with or immediately following a meal. Start taking on: December 25, 2023 What changed:  how much to take when to take this   nitroGLYCERIN  0.4 MG SL tablet Commonly known as: NITROSTAT  Place 0.4 mg under the tongue every 5 (five) minutes as needed for chest pain.   pantoprazole  40 MG tablet Commonly known as: Protonix  Take 1 tablet (40 mg total) by mouth daily.   promethazine  25 MG tablet Commonly known as: PHENERGAN  Take 1 tablet (25 mg total)  by mouth every 8 (eight) hours as needed for refractory nausea / vomiting.   sodium bicarbonate  650 MG tablet Take 2 tablets (1,300 mg total) by mouth 2 (two) times daily.   Tums 500 MG chewable tablet Generic drug: calcium  carbonate Chew 2 tablets by mouth 3 (three) times daily with meals.          Allergies  Allergen Reactions   Morphine  Anxiety    agitation      Subjective: doing well, still some nausea but pt feeling overall better, no CP, ready to go home    Discharge Exam: BP (!) 163/90   Pulse 93   Temp 97.6 F (36.4 C) (Oral)   Resp 15   Ht 5' 9 (1.753 m)   Wt 55.6 kg   SpO2 96%   BMI 18.10 kg/m  General: Pt is alert, awake, not in acute distress Cardiovascular: RRR, S1/S2 +, no rubs, no gallops Respiratory: CTA bilaterally, no wheezing, no rhonchi Abdominal: Soft, NT, ND, bowel sounds + Extremities: no edema, no cyanosis     The results of significant diagnostics from this hospitalization (including imaging, microbiology, ancillary and laboratory) are listed below for reference.     Microbiology: No results  found for this or any previous visit (from the past 240 hours).   Labs: BNP (last 3 results) Recent Labs    04/22/23 1206  BNP 168.8*   Basic Metabolic Panel: Recent Labs  Lab 12/21/23 2001 12/22/23 0456 12/23/23 0451  NA 139 143 143  K 5.8* 5.5* 4.9  CL 108 111 108  CO2 14* 16* 19*  GLUCOSE 90 73 86  BUN 73* 72* 61*  CREATININE 8.85* 8.75* 8.07*  CALCIUM  7.4* 7.0* 7.2*  PHOS  --   --  8.4*   Liver Function Tests: Recent Labs  Lab 12/21/23 2001  AST 16  ALT 24  ALKPHOS 169*  BILITOT 0.2  PROT 6.2*  ALBUMIN 3.3*   No results for input(s): LIPASE, AMYLASE in the last 168 hours. No results for input(s): AMMONIA in the last 168 hours. CBC: Recent Labs  Lab 12/21/23 2001 12/22/23 0456 12/22/23 1501 12/23/23 0451  WBC 8.3 6.9  --  8.6  NEUTROABS 6.8  --   --   --   HGB 8.3* 6.7* 8.3* 8.6*  HCT 27.2* 21.6* 26.1* 26.2*  MCV 98.6 97.3  --  91.6  PLT 253 226  --  224   Cardiac Enzymes: No results for input(s): CKTOTAL, CKMB, CKMBINDEX, TROPONINI in the last 168 hours. BNP: Invalid input(s): POCBNP CBG: No results for input(s): GLUCAP in the last 168 hours. D-Dimer No results for input(s): DDIMER in the last 72 hours. Hgb A1c No results for input(s): HGBA1C in the last 72 hours. Lipid Profile No results for input(s): CHOL, HDL, LDLCALC, TRIG, CHOLHDL, LDLDIRECT in the last 72 hours. Thyroid  function studies No results for input(s): TSH, T4TOTAL, T3FREE, THYROIDAB in the last 72 hours.  Invalid input(s): FREET3 Anemia work up No results for input(s): VITAMINB12, FOLATE, FERRITIN, TIBC, IRON , RETICCTPCT in the last 72 hours. Urinalysis    Component Value Date/Time   COLORURINE YELLOW (A) 04/22/2023 1952   APPEARANCEUR Clear 10/04/2023 1305   LABSPEC 1.020 04/22/2023 1952   PHURINE 7.0 04/22/2023 1952   GLUCOSEU 1+ (A) 10/04/2023 1305   HGBUR MODERATE (A) 04/22/2023 1952   BILIRUBINUR Negative  10/04/2023 1305   KETONESUR NEGATIVE 04/22/2023 1952   PROTEINUR 3+ (A) 10/04/2023 1305   PROTEINUR >=300 (A) 04/22/2023 1952  NITRITE Negative 10/04/2023 1305   NITRITE NEGATIVE 04/22/2023 1952   LEUKOCYTESUR Negative 10/04/2023 1305   LEUKOCYTESUR NEGATIVE 04/22/2023 1952   Sepsis Labs Recent Labs  Lab 12/21/23 2001 12/22/23 0456 12/23/23 0451  WBC 8.3 6.9 8.6   Microbiology No results found for this or any previous visit (from the past 240 hours). Imaging ECHOCARDIOGRAM COMPLETE Result Date: 12/23/2023    ECHOCARDIOGRAM REPORT   Patient Name:   NATAN HARTOG Date of Exam: 12/23/2023 Medical Rec #:  [id number]           Height:       69.0 in Accession #:    [id number]          Weight:       122.6 lb Date of Birth:  09/24/1950           BSA:          1.678 m Patient Age:    73 years            BP:           162/88 mmHg Patient Gender: M                   HR:           94 bpm. Exam Location:  [facility] Procedure: 2D Echo, Cardiac Doppler and Color Doppler (Both Spectral and Color            Flow Doppler were utilized during procedure). Indications:     Dyspnea R06.00  History:         Patient has prior history of Echocardiogram examinations, most                  recent 10/09/2022. Signs/Symptoms:Murmur.  Sonographer:     Christopher Furnace Referring Phys:  [id number] LANETA BLUNT Diagnosing Phys: Sabina Custovic IMPRESSIONS  1. Left ventricular ejection fraction, by estimation, is 60 to 65%. The left ventricle has normal function. The left ventricle has no regional wall motion abnormalities. Left ventricular diastolic parameters are consistent with Grade II diastolic dysfunction (pseudonormalization).  2. Right ventricular systolic function is normal. The right ventricular size is normal.  3. The mitral valve is normal in structure. Mild mitral valve regurgitation. No evidence of mitral stenosis.  4. The aortic valve is normal in structure. Aortic valve regurgitation is not visualized. Aortic valve  sclerosis/calcification is present, without any evidence of aortic stenosis.  5. The inferior vena cava is normal in size with greater than 50% respiratory variability, suggesting right atrial pressure of 3 mmHg. FINDINGS  Left Ventricle: Left ventricular ejection fraction, by estimation, is 60 to 65%. The left ventricle has normal function. The left ventricle has no regional wall motion abnormalities. The left ventricular internal cavity size was normal in size. There is  no left ventricular hypertrophy. Left ventricular diastolic parameters are consistent with Grade II diastolic dysfunction (pseudonormalization). Right Ventricle: The right ventricular size is normal. No increase in right ventricular wall thickness. Right ventricular systolic function is normal. Left Atrium: Left atrial size was normal in size. Right Atrium: Right atrial size was normal in size. Pericardium: There is no evidence of pericardial effusion. Mitral Valve: The mitral valve is normal in structure. Mild mitral valve regurgitation. No evidence of mitral valve stenosis. MV peak gradient, 24.6 mmHg. The mean mitral valve gradient is 12.0 mmHg. Tricuspid Valve: The tricuspid valve is normal in structure. Tricuspid valve regurgitation is mild. Aortic Valve: The aortic valve is normal in structure.  Aortic valve regurgitation is not visualized. Aortic valve sclerosis/calcification is present, without any evidence of aortic stenosis. Aortic valve mean gradient measures 7.0 mmHg. Aortic valve peak  gradient measures 13.1 mmHg. Aortic valve area, by VTI measures 2.69 cm. Pulmonic Valve: The pulmonic valve was normal in structure. Pulmonic valve regurgitation is not visualized. Aorta: The aortic root is normal in size and structure. Venous: The inferior vena cava is normal in size with greater than 50% respiratory variability, suggesting right atrial pressure of 3 mmHg. IAS/Shunts: No atrial level shunt detected by color flow Doppler.  LEFT  VENTRICLE PLAX 2D LVIDd:         4.50 cm   Diastology LVIDs:         3.10 cm   LV e' medial:    7.62 cm/s LV PW:         1.00 cm   LV E/e' medial:  18.5 LV IVS:        1.00 cm   LV e' lateral:   9.25 cm/s LVOT diam:     2.00 cm   LV E/e' lateral: 15.2 LV SV:         89 LV SV Index:   53 LVOT Area:     3.14 cm LV IVRT:       98 msec  RIGHT VENTRICLE RV Basal diam:  2.10 cm     PULMONARY VEINS RV Mid diam:    2.40 cm     Diastolic Velocity: 47.20 cm/s RV S prime:     15.90 cm/s  S/D Velocity:       0.60 TAPSE (M-mode): 2.4 cm      Systolic Velocity:  28.10 cm/s LEFT ATRIUM           Index        RIGHT ATRIUM          Index LA diam:      2.80 cm 1.67 cm/m   RA Area:     7.26 cm LA Vol (A2C): 41.1 ml 24.49 ml/m  RA Volume:   9.54 ml  5.69 ml/m LA Vol (A4C): 47.7 ml 28.43 ml/m  AORTIC VALVE AV Area (Vmax):    2.26 cm AV Area (Vmean):   2.30 cm AV Area (VTI):     2.69 cm AV Vmax:           181.00 cm/s AV Vmean:          124.000 cm/s AV VTI:            0.330 m AV Peak Grad:      13.1 mmHg AV Mean Grad:      7.0 mmHg LVOT Vmax:         130.00 cm/s LVOT Vmean:        90.900 cm/s LVOT VTI:          0.283 m LVOT/AV VTI ratio: 0.86  AORTA Ao Root diam: 3.60 cm MITRAL VALVE                TRICUSPID VALVE MV Area (PHT): 6.60 cm     TR Peak grad:   26.4 mmHg MV Area VTI:   2.58 cm     TR Vmax:        257.00 cm/s MV Peak grad:  24.6 mmHg MV Mean grad:  12.0 mmHg    SHUNTS MV Vmax:       2.48 m/s     Systemic VTI:  0.28 m MV Vmean:  161.0 cm/s   Systemic Diam: 2.00 cm MV Decel Time: 115 msec MV E velocity: 141.00 cm/s MV A velocity: 212.00 cm/s MV E/A ratio:  0.67 Sabina Custovic Electronically signed by Annalee Casa Signature Date/Time: 12/23/2023/9:42:24 AM    Final       Time coordinating discharge: over 30 minutes  SIGNED:  Laneta Blunt DO [facility]

## 2023-12-24 NOTE — Evaluation (Signed)
 Physical Therapy Evaluation Patient Details Name: Casey French MRN: [id number] DOB: 05-07-1950 Today's Date: 12/24/2023  History of Present Illness  73 y.o. Caucasian male with medical history significant for coronary artery disease, hypertension, dyslipidemia and type 2 diabetes mellitus, gout and GERD, who presented to the emergency room with worsening generalized weakness, significantly worse over the last couple of days.  The patient missed his peritoneal dialysis over the last couple of days.  He reported nausea and vomiting with mild diarrhea.  Advised to come to the ED d/t high K on labs at his cardiologist   Clinical Impression  Patient received in recliner, he is very pleasant and agreeable to PT assessment. Patient performed all mobility at mod I level. No LOB, no difficulty with ambulation using RW 175 feet. Patient has a walker at home to use as needed. He does not require skilled PT follow up at this time. Signing off.          If plan is discharge home, recommend the following: Assist for transportation   Can travel by private vehicle    yes    Equipment Recommendations None recommended by PT  Recommendations for Other Services       Functional Status Assessment Patient has not had a recent decline in their functional status     Precautions / Restrictions Precautions Precautions: None Recall of Precautions/Restrictions: Intact Restrictions Weight Bearing Restrictions Per Provider Order: No      Mobility  Bed Mobility               General bed mobility comments: NT, in recliner    Transfers Overall transfer level: Modified independent Equipment used: Rolling walker (2 wheels) Transfers: Sit to/from Stand Sit to Stand: Modified independent (Device/Increase time)                Ambulation/Gait Ambulation/Gait assistance: Modified independent (Device/Increase time) Gait Distance (Feet): 175 Feet Assistive device: Rolling walker (2  wheels) Gait Pattern/deviations: WFL(Within Functional Limits) Gait velocity: WFL     General Gait Details: no difficulties with ambulation using RW. No nausea this session. No LOB.  Stairs            Wheelchair Mobility     Tilt Bed    Modified Rankin (Stroke Patients Only)       Balance Overall balance assessment: Modified Independent                                           Pertinent Vitals/Pain Pain Assessment Pain Assessment: No/denies pain    Home Living Family/patient expects to be discharged to:: Private residence Living Arrangements: Spouse/significant other Available Help at Discharge: Family;Available 24 hours/day Type of Home: House Home Access: Stairs to enter Entrance Stairs-Rails: Right;Left;Can reach both Entrance Stairs-Number of Steps: 4   Home Layout: One level Home Equipment: Agricultural Consultant (2 wheels);Cane - single point;BSC/3in1      Prior Function Prior Level of Function : Driving;Independent/Modified Independent             Mobility Comments: No using AD at baseline, but has walker as needed ADLs Comments: independent     Extremity/Trunk Assessment   Upper Extremity Assessment Upper Extremity Assessment: Defer to OT evaluation    Lower Extremity Assessment Lower Extremity Assessment: Overall WFL for tasks assessed    Cervical / Trunk Assessment Cervical / Trunk Assessment: Normal  Communication  Communication Communication: No apparent difficulties    Cognition Arousal: Alert Behavior During Therapy: WFL for tasks assessed/performed   PT - Cognitive impairments: No apparent impairments                         Following commands: Intact       Cueing Cueing Techniques: Verbal cues     General Comments General comments (skin integrity, edema, etc.): HR in 90's throughout, pt endorsed lightheaded with mobility improving wiht standing rest break, nausea at end of mobility, RN  notified    Exercises     Assessment/Plan    PT Assessment Patient does not need any further PT services  PT Problem List         PT Treatment Interventions      PT Goals (Current goals can be found in the Care Plan section)  Acute Rehab PT Goals Patient Stated Goal: return home today PT Goal Formulation: With patient Time For Goal Achievement: 12/25/23 Potential to Achieve Goals: Good    Frequency       Co-evaluation               AM-PAC PT 6 Clicks Mobility  Outcome Measure Help needed turning from your back to your side while in a flat bed without using bedrails?: None Help needed moving from lying on your back to sitting on the side of a flat bed without using bedrails?: None Help needed moving to and from a bed to a chair (including a wheelchair)?: None Help needed standing up from a chair using your arms (e.g., wheelchair or bedside chair)?: None Help needed to walk in hospital room?: None Help needed climbing 3-5 steps with a railing? : None 6 Click Score: 24    End of Session   Activity Tolerance: Patient tolerated treatment well Patient left: in chair;with call bell/phone within reach Nurse Communication: Mobility status      Time: 8972-8963 PT Time Calculation (min) (ACUTE ONLY): 9 min   Charges:   PT Evaluation $PT Eval Low Complexity: 1 Low   PT General Charges $$ ACUTE PT VISIT: 1 Visit         Trenton Verne, PT, GCS 12/24/23,10:44 AM

## 2023-12-24 NOTE — Evaluation (Signed)
 Occupational Therapy Evaluation Patient Details Name: Casey French MRN: [id number] DOB: 01-29-1950 Today's Date: 12/24/2023   History of Present Illness   73 y.o. Caucasian male with medical history significant for coronary artery disease, hypertension, dyslipidemia and type 2 diabetes mellitus, gout and GERD, who presented to the emergency room with worsening generalized weakness, significantly worse over the last couple of days.  The patient missed his peritoneal dialysis over the last couple of days.  He reported nausea and vomiting with mild diarrhea.  Advised to come to the ED d/t high K on labs at his cardiologist     Clinical Impressions Pt was seen for OT evaluation this date. Prior to hospital admission, pt was indep in all aspects, living with his spouse of 73yrs. Pt presents with deficits in strength, activity tolerance, mild balance deficits, and nausea with mobility, affecting safe and optimal ADL completion. Pt currently requires increased time/effort for ADL transfers, PRN MIN A for LB ADL, SBA-CGA for ADL mobility with RW. Pt ambulated ~140' + 85' with RW and brief standing rest break. Pt educated in ECS and use of AE/DME for ADL to maximize safety/indep. Pt endorsed lightheadedness improving with brief standing rest break 2/3rd of the way through the walk and then endorsed nausea upon returning to recliner. RN notified and emesis bag provided. Pt notes the same thing happened yesterday when walking. Pt would benefit from skilled OT services to address noted impairments and functional limitations (see below for any additional details) in order to maximize safety and independence while minimizing future risk of falls, injury, and readmission. Anticipate the need for follow up OT services upon acute hospital DC.    If plan is discharge home, recommend the following:   A little help with bathing/dressing/bathroom;A little help with walking and/or transfers;Assist for  transportation;Help with stairs or ramp for entrance;Assistance with cooking/housework     Functional Status Assessment   Patient has had a recent decline in their functional status and demonstrates the ability to make significant improvements in function in a reasonable and predictable amount of time.     Equipment Recommendations   Tub/shower bench     Recommendations for Other Services         Precautions/Restrictions   Precautions Precautions: Fall Recall of Precautions/Restrictions: Intact Restrictions Weight Bearing Restrictions Per Provider Order: No     Mobility Bed Mobility               General bed mobility comments: NT, in recliner    Transfers Overall transfer level: Needs assistance Equipment used: Rolling walker (2 wheels) Transfers: Sit to/from Stand Sit to Stand: Modified independent (Device/Increase time)                  Balance Overall balance assessment: Mild deficits observed, not formally tested                                         ADL either performed or assessed with clinical judgement   ADL Overall ADL's : Needs assistance/impaired                                       General ADL Comments: Pt grossly PRN MIN A for LB ADL tasks, anticipate Supv-CGA for toileting in standing, SBA-CGA for ADL mobility wiht RW  Vision         Perception         Praxis         Pertinent Vitals/Pain Pain Assessment Pain Assessment: 0-10 Pain Score: 2  Pain Location: low back Pain Descriptors / Indicators: Aching Pain Intervention(s): Limited activity within patient's tolerance, Monitored during session, Premedicated before session, Repositioned     Extremity/Trunk Assessment Upper Extremity Assessment Upper Extremity Assessment: Generalized weakness   Lower Extremity Assessment Lower Extremity Assessment: Defer to PT evaluation       Communication Communication Communication:  No apparent difficulties   Cognition Arousal: Alert Behavior During Therapy: WFL for tasks assessed/performed Cognition: No apparent impairments                               Following commands: Intact       Cueing  General Comments   Cueing Techniques: Verbal cues  HR in 90's throughout, pt endorsed lightheaded with mobility improving wiht standing rest break, nausea at end of mobility, RN notified   Exercises Other Exercises Other Exercises: Pt ambulated ~140' + 50' with RW and brief standing rest break. Pt educated in ECS and use of AE/DME for ADL to maximize safety/indep   Shoulder Instructions      Home Living Family/patient expects to be discharged to:: Private residence Living Arrangements: Spouse/significant other Available Help at Discharge: Family;Available 24 hours/day Type of Home: House Home Access: Stairs to enter [employer] of Steps: 4 Entrance Stairs-Rails: Right;Left;Can reach both Home Layout: One level     Bathroom Shower/Tub: Chief Strategy Officer: Standard     Home Equipment: [employer] (2 wheels);Cane - single point;BSC/3in1          Prior Functioning/Environment Prior Level of Function : Driving;Independent/Modified Independent                    OT Problem List: Decreased strength;Decreased activity tolerance;Impaired balance (sitting and/or standing);Decreased knowledge of use of DME or AE   OT Treatment/Interventions: Self-care/ADL training;Therapeutic exercise;Therapeutic activities;Energy conservation;DME and/or AE instruction;Patient/family education;Balance training      OT Goals(Current goals can be found in the care plan section)   Acute Rehab OT Goals Patient Stated Goal: get better and go home OT Goal Formulation: With patient Time For Goal Achievement: 01/07/24 Potential to Achieve Goals: Good ADL Goals Additional ADL Goal #1: Pt will complete all aspects of morning  routine including toileting, dressing, and grooming with mod indep, using learned ECS as needed, 2/2 opportunities. Additional ADL Goal #2: Pt will verbalize plan to implement at least 2 learned ECS into home ADL/IADL routines to maximize safety.   OT Frequency:  Min 2X/week    Co-evaluation              AM-PAC OT 6 Clicks Daily Activity     Outcome Measure Help from another person eating meals?: None Help from another person taking care of personal grooming?: None Help from another person toileting, which includes using toliet, bedpan, or urinal?: A Little Help from another person bathing (including washing, rinsing, drying)?: A Little Help from another person to put on and taking off regular upper body clothing?: None Help from another person to put on and taking off regular lower body clothing?: A Little 6 Click Score: 21   End of Session Equipment Utilized During Treatment: Rolling walker (2 wheels) Nurse Communication: Mobility status;Other (comment) (nausea)  Activity Tolerance: Treatment  limited secondary to medical complications (Comment) (nausea) Patient left: in chair;with call bell/phone within reach  OT Visit Diagnosis: Unsteadiness on feet (R26.81);Muscle weakness (generalized) (M62.81)                Time: 9095-9079 OT Time Calculation (min): 16 min Charges:  OT General Charges $OT Visit: 1 Visit OT Evaluation $OT Eval Low Complexity: 1 Low OT Treatments $Self Care/Home Management : 8-22 mins  Warren SAUNDERS., MPH, MS, OTR/L ascom [phone] 12/24/23, 9:32 AM

## 2023-12-24 NOTE — Progress Notes (Signed)
 [facility]  ROUNDING NOTE   Subjective:   Patient well-known to us  from prior admissions. Came in with generalized weakness. Has missed several days of dialysis treatments.  Update: Patient seen sitting up in chair Eating breakfast States he was nauseous yesterday ambulating with mobility, vomited when returned to room.   PD UF  Objective:  Vital signs in last 24 hours:  Temp:  [97.6 F (36.4 C)-97.9 F (36.6 C)] 97.6 F (36.4 C) (11/29 0735) Pulse Rate:  [87-93] 93 (11/29 0754) Resp:  [15-20] 15 (11/29 0753) BP: (147-163)/(79-94) 163/90 (11/29 0754) SpO2:  [94 %-98 %] 96 % (11/29 0753)  Weight change:  Filed Weights   12/21/23 1939 12/23/23 0428  Weight: 59 kg 55.6 kg    Intake/Output: I/O last 3 completed shifts: In: 254 [P.O.:600] Out: 2650 [Urine:2650]   Intake/Output this shift:  Total I/O In: 560 [P.O.:120; Other:440] Out: -   Physical Exam: General: No acute distress  Head: Normocephalic, atraumatic. Moist oral mucosal membranes  Lungs:  Clear to auscultation, normal effort  Heart: S1S2 no rubs  Abdomen:  Soft, nontender, bowel sounds present  Extremities: No peripheral edema.  Neurologic: Awake, alert, following commands  Skin: No acute rash  Access: PD catheter in place    Basic Metabolic Panel: Recent Labs  Lab 12/21/23 2001 12/22/23 0456 12/23/23 0451  NA 139 143 143  K 5.8* 5.5* 4.9  CL 108 111 108  CO2 14* 16* 19*  GLUCOSE 90 73 86  BUN 73* 72* 61*  CREATININE 8.85* 8.75* 8.07*  CALCIUM  7.4* 7.0* 7.2*  PHOS  --   --  8.4*    Liver Function Tests: Recent Labs  Lab 12/21/23 2001  AST 16  ALT 24  ALKPHOS 169*  BILITOT 0.2  PROT 6.2*  ALBUMIN 3.3*   No results for input(s): LIPASE, AMYLASE in the last 168 hours. No results for input(s): AMMONIA in the last 168 hours.  CBC: Recent Labs  Lab 12/21/23 2001 12/22/23 0456 12/22/23 1501 12/23/23 0451  WBC 8.3 6.9  --  8.6  NEUTROABS 6.8  --    --   --   HGB 8.3* 6.7* 8.3* 8.6*  HCT 27.2* 21.6* 26.1* 26.2*  MCV 98.6 97.3  --  91.6  PLT 253 226  --  224    Cardiac Enzymes: No results for input(s): CKTOTAL, CKMB, CKMBINDEX, TROPONINI in the last 168 hours.  BNP: Invalid input(s): POCBNP  CBG: No results for input(s): GLUCAP in the last 168 hours.  Microbiology: Results for orders placed or performed in visit on 10/04/23  Microscopic Examination     Status: Abnormal   Collection Time: 10/04/23  1:05 PM   Urine  Result Value Ref Range Status   WBC, UA 6-10 (A) 0 - 5 /hpf Final   RBC, Urine 3-10 (A) 0 - 2 /hpf Final   Epithelial Cells (non renal) 0-10 0 - 10 /hpf Final   Bacteria, UA Few None seen/Few Final    Coagulation Studies: No results for input(s): LABPROT, INR in the last 72 hours.  Urinalysis: No results for input(s): COLORURINE, LABSPEC, PHURINE, GLUCOSEU, HGBUR, BILIRUBINUR, KETONESUR, PROTEINUR, UROBILINOGEN, NITRITE, LEUKOCYTESUR in the last 72 hours.  Invalid input(s): APPERANCEUR    Imaging: ECHOCARDIOGRAM COMPLETE Result Date: 12/23/2023    ECHOCARDIOGRAM REPORT   Patient Name:   QUAMAINE WEBB Date of Exam: 12/23/2023 Medical Rec #:  [id number]           Height:  69.0 in Accession #:    [id number]          Weight:       122.6 lb Date of Birth:  1951/01/10           BSA:          1.678 m Patient Age:    73 years            BP:           162/88 mmHg Patient Gender: M                   HR:           94 bpm. Exam Location:  [facility] Procedure: 2D Echo, Cardiac Doppler and Color Doppler (Both Spectral and Color            Flow Doppler were utilized during procedure). Indications:     Dyspnea R06.00  History:         Patient has prior history of Echocardiogram examinations, most                  recent 10/09/2022. Signs/Symptoms:Murmur.  Sonographer:     Christopher Furnace Referring Phys:  [id number] LANETA BLUNT Diagnosing Phys: Sabina Custovic IMPRESSIONS  1. Left  ventricular ejection fraction, by estimation, is 60 to 65%. The left ventricle has normal function. The left ventricle has no regional wall motion abnormalities. Left ventricular diastolic parameters are consistent with Grade II diastolic dysfunction (pseudonormalization).  2. Right ventricular systolic function is normal. The right ventricular size is normal.  3. The mitral valve is normal in structure. Mild mitral valve regurgitation. No evidence of mitral stenosis.  4. The aortic valve is normal in structure. Aortic valve regurgitation is not visualized. Aortic valve sclerosis/calcification is present, without any evidence of aortic stenosis.  5. The inferior vena cava is normal in size with greater than 50% respiratory variability, suggesting right atrial pressure of 3 mmHg. FINDINGS  Left Ventricle: Left ventricular ejection fraction, by estimation, is 60 to 65%. The left ventricle has normal function. The left ventricle has no regional wall motion abnormalities. The left ventricular internal cavity size was normal in size. There is  no left ventricular hypertrophy. Left ventricular diastolic parameters are consistent with Grade II diastolic dysfunction (pseudonormalization). Right Ventricle: The right ventricular size is normal. No increase in right ventricular wall thickness. Right ventricular systolic function is normal. Left Atrium: Left atrial size was normal in size. Right Atrium: Right atrial size was normal in size. Pericardium: There is no evidence of pericardial effusion. Mitral Valve: The mitral valve is normal in structure. Mild mitral valve regurgitation. No evidence of mitral valve stenosis. MV peak gradient, 24.6 mmHg. The mean mitral valve gradient is 12.0 mmHg. Tricuspid Valve: The tricuspid valve is normal in structure. Tricuspid valve regurgitation is mild. Aortic Valve: The aortic valve is normal in structure. Aortic valve regurgitation is not visualized. Aortic valve sclerosis/calcification  is present, without any evidence of aortic stenosis. Aortic valve mean gradient measures 7.0 mmHg. Aortic valve peak  gradient measures 13.1 mmHg. Aortic valve area, by VTI measures 2.69 cm. Pulmonic Valve: The pulmonic valve was normal in structure. Pulmonic valve regurgitation is not visualized. Aorta: The aortic root is normal in size and structure. Venous: The inferior vena cava is normal in size with greater than 50% respiratory variability, suggesting right atrial pressure of 3 mmHg. IAS/Shunts: No atrial level shunt detected by color flow Doppler.  LEFT VENTRICLE PLAX 2D LVIDd:  4.50 cm   Diastology LVIDs:         3.10 cm   LV e' medial:    7.62 cm/s LV PW:         1.00 cm   LV E/e' medial:  18.5 LV IVS:        1.00 cm   LV e' lateral:   9.25 cm/s LVOT diam:     2.00 cm   LV E/e' lateral: 15.2 LV SV:         89 LV SV Index:   53 LVOT Area:     3.14 cm LV IVRT:       98 msec  RIGHT VENTRICLE RV Basal diam:  2.10 cm     PULMONARY VEINS RV Mid diam:    2.40 cm     Diastolic Velocity: 47.20 cm/s RV S prime:     15.90 cm/s  S/D Velocity:       0.60 TAPSE (M-mode): 2.4 cm      Systolic Velocity:  28.10 cm/s LEFT ATRIUM           Index        RIGHT ATRIUM          Index LA diam:      2.80 cm 1.67 cm/m   RA Area:     7.26 cm LA Vol (A2C): 41.1 ml 24.49 ml/m  RA Volume:   9.54 ml  5.69 ml/m LA Vol (A4C): 47.7 ml 28.43 ml/m  AORTIC VALVE AV Area (Vmax):    2.26 cm AV Area (Vmean):   2.30 cm AV Area (VTI):     2.69 cm AV Vmax:           181.00 cm/s AV Vmean:          124.000 cm/s AV VTI:            0.330 m AV Peak Grad:      13.1 mmHg AV Mean Grad:      7.0 mmHg LVOT Vmax:         130.00 cm/s LVOT Vmean:        90.900 cm/s LVOT VTI:          0.283 m LVOT/AV VTI ratio: 0.86  AORTA Ao Root diam: 3.60 cm MITRAL VALVE                TRICUSPID VALVE MV Area (PHT): 6.60 cm     TR Peak grad:   26.4 mmHg MV Area VTI:   2.58 cm     TR Vmax:        257.00 cm/s MV Peak grad:  24.6 mmHg MV Mean grad:  12.0 mmHg     SHUNTS MV Vmax:       2.48 m/s     Systemic VTI:  0.28 m MV Vmean:      161.0 cm/s   Systemic Diam: 2.00 cm MV Decel Time: 115 msec MV E velocity: 141.00 cm/s MV A velocity: 212.00 cm/s MV E/A ratio:  0.67 Sabina Custovic Electronically signed by Annalee Casa Signature Date/Time: 12/23/2023/9:42:24 AM    Final      Medications:    dialysis solution 1.5% low-MG/low-CA      amLODipine   10 mg Oral Daily   apixaban   2.5 mg Oral BID   aspirin  EC  81 mg Oral Daily   calcitRIOL   0.25 mcg Oral Daily   calcium  carbonate  2 tablet Oral TID WC   cyanocobalamin   1,000 mcg Oral  Daily   folic acid   1 mg Oral Daily   gabapentin   300 mg Oral QHS   gentamicin  cream  1 Application Topical Daily   metoprolol  succinate  100 mg Oral Q breakfast   pantoprazole   40 mg Oral Daily   scopolamine  1 patch Transdermal Q72H   sodium bicarbonate   1,300 mg Oral BID   acetaminophen  **OR** acetaminophen , hydrALAZINE , HYDROcodone -acetaminophen , hydrocortisone , isosorbide  mononitrate, magnesium  hydroxide, nitroGLYCERIN , ondansetron  **OR** ondansetron  (ZOFRAN ) IV, traZODone   Assessment/ Plan:  73 y.o. male with past medical history of coronary artery disease, hypertension, hyperlipidemia, diabetes mellitus type 2, gout, GERD, ESRD on PD, anemia chronic kidney disease, secondary hyperparathyroidism who presented with generalized weakness and missed peritoneal dialysis treatments.  1.  ESRD on PD.  Patient has missed several days of peritoneal dialysis due to generalized weakness.  Received PD overnight, no alarms. UF .    2.  Hyperkalemia.  Will continue to monitor for need of temporizing measures.   3.  Anemia of chronic kidney disease.  Hgb stable after blood transfusion. Continue Mircera as outpatient.  4.  Secondary hyperparathyroidism.  Corrected calcium  7.8. Phos 8.4. Continue calcitriol  and calcium  carbonate.    LOS: 3 Aryan Sparks 11/29/202512:17 PM

## 2023-12-25 DIAGNOSIS — Z992 Dependence on renal dialysis: Secondary | ICD-10-CM | POA: Diagnosis not present

## 2023-12-25 DIAGNOSIS — N186 End stage renal disease: Secondary | ICD-10-CM | POA: Diagnosis not present

## 2023-12-27 DIAGNOSIS — N186 End stage renal disease: Secondary | ICD-10-CM | POA: Diagnosis not present

## 2023-12-27 DIAGNOSIS — Z992 Dependence on renal dialysis: Secondary | ICD-10-CM | POA: Diagnosis not present

## 2023-12-29 ENCOUNTER — Emergency Department: Admission: EM | Admit: 2023-12-29 | Discharge: 2023-12-29 | Disposition: A | Discharge status: Home / Self Care

## 2023-12-29 ENCOUNTER — Other Ambulatory Visit: Payer: Self-pay

## 2023-12-29 DIAGNOSIS — I120 Hypertensive chronic kidney disease with stage 5 chronic kidney disease or end stage renal disease: Secondary | ICD-10-CM | POA: Insufficient documentation

## 2023-12-29 DIAGNOSIS — E1122 Type 2 diabetes mellitus with diabetic chronic kidney disease: Secondary | ICD-10-CM | POA: Insufficient documentation

## 2023-12-29 DIAGNOSIS — I2510 Atherosclerotic heart disease of native coronary artery without angina pectoris: Secondary | ICD-10-CM | POA: Insufficient documentation

## 2023-12-29 DIAGNOSIS — N186 End stage renal disease: Secondary | ICD-10-CM | POA: Insufficient documentation

## 2023-12-29 DIAGNOSIS — E875 Hyperkalemia: Secondary | ICD-10-CM | POA: Insufficient documentation

## 2023-12-29 DIAGNOSIS — R7989 Other specified abnormal findings of blood chemistry: Secondary | ICD-10-CM

## 2023-12-29 DIAGNOSIS — Z992 Dependence on renal dialysis: Secondary | ICD-10-CM | POA: Insufficient documentation

## 2023-12-29 LAB — URINALYSIS, ROUTINE W REFLEX MICROSCOPIC
Bilirubin Urine: NEGATIVE
Glucose, UA: >=500 mg/dL — AB
Hgb urine dipstick: NEGATIVE
Ketones, ur: NEGATIVE mg/dL
Leukocytes,Ua: NEGATIVE
Nitrite: NEGATIVE
Protein, ur: >=300 mg/dL — AB
Specific Gravity, Urine: 1.009 (ref 1.005–1.030)
pH: 8 (ref 5.0–8.0)

## 2023-12-29 LAB — COMPREHENSIVE METABOLIC PANEL WITH GFR
ALT: 18 U/L (ref 0–44)
AST: 18 U/L (ref 15–41)
Albumin: 3.5 g/dL (ref 3.5–5.0)
Alkaline Phosphatase: 179 U/L — ABNORMAL HIGH (ref 38–126)
Anion gap: 16 — ABNORMAL HIGH (ref 5–15)
BUN: 96 mg/dL — ABNORMAL HIGH (ref 8–23)
CO2: 18 mmol/L — ABNORMAL LOW (ref 22–32)
Calcium: 7.5 mg/dL — ABNORMAL LOW (ref 8.9–10.3)
Chloride: 106 mmol/L (ref 98–111)
Creatinine, Ser: 8.2 mg/dL — ABNORMAL HIGH (ref 0.61–1.24)
GFR, Estimated: 6 mL/min — ABNORMAL LOW (ref >60)
Glucose, Bld: 94 mg/dL (ref 70–99)
Potassium: 5.8 mmol/L — ABNORMAL HIGH (ref 3.5–5.1)
Sodium: 141 mmol/L (ref 135–145)
Total Bilirubin: 0.2 mg/dL (ref 0.0–1.2)
Total Protein: 6.6 g/dL (ref 6.5–8.1)

## 2023-12-29 LAB — MAGNESIUM: Magnesium: 1.7 mg/dL (ref 1.7–2.4)

## 2023-12-29 LAB — CBC
HCT: 28.9 % — ABNORMAL LOW (ref 39.0–52.0)
Hemoglobin: 8.9 g/dL — ABNORMAL LOW (ref 13.0–17.0)
MCH: 29.9 pg (ref 26.0–34.0)
MCHC: 30.8 g/dL (ref 30.0–36.0)
MCV: 97 fL (ref 80.0–100.0)
Platelets: 295 K/uL (ref 150–400)
RBC: 2.98 MIL/uL — ABNORMAL LOW (ref 4.22–5.81)
RDW: 16 % — ABNORMAL HIGH (ref 11.5–15.5)
WBC: 10.4 K/uL (ref 4.0–10.5)
nRBC: 0 % (ref 0.0–0.2)

## 2023-12-29 LAB — PHOSPHORUS: Phosphorus: 7.2 mg/dL — ABNORMAL HIGH (ref 2.5–4.6)

## 2023-12-29 MED ORDER — CALCIUM GLUCONATE-NACL 1-0.675 GM/50ML-% IV SOLN
1.0000 g | Freq: Once | INTRAVENOUS | Status: AC
  Administered 2023-12-29: 1000 mg via INTRAVENOUS
  Filled 2023-12-29: qty 50

## 2023-12-29 MED ORDER — SODIUM ZIRCONIUM CYCLOSILICATE 10 G PO PACK
10.0000 g | PACK | Freq: Once | ORAL | Status: AC
  Administered 2023-12-29: 10 g via ORAL
  Filled 2023-12-29: qty 1

## 2023-12-29 NOTE — ED Triage Notes (Signed)
 Patient sent over from Dialysis due to elevated Calcium ; reports feeling fatigued. States he missed approximately 2 days of his peritoneal dialysis.

## 2023-12-29 NOTE — ED Provider Notes (Signed)
 [facility] Provider Note    Event Date/Time   First MD Initiated Contact with Patient 12/29/23 1642     (approximate)   History   Abnormal Labs   HPI  Casey French is a 73 y.o. male  caucasian male with medical history significant for coronary artery disease, hypertension, dyslipidemia and type 2 diabetes mellitus, gout and GERD, end-stage renal disease on nightly peritoneal dialysis who presented to the emergency room, last admitted 12/21/2023-12/24/2023 for electrolyte derangements after skipping his peritoneal dialysis who presents with abnormal labs.  Patient had routine blood work obtained today by his dialysis team and it was noted that he had a low calcium .  Patient does report that earlier this week he did miss 2 nightly episodes of peritoneal dialysis as he felt too nauseous to complete this.  He did give himself dialysis yesterday.  He feels generally weak but denies any chest pain shortness of breath abdominal pain changes in urinary or bowel habits.  Denies any SI HI or AVH S.  He presents with his wife who contributes to the history      Physical Exam   Triage Vital Signs: ED Triage Vitals [12/29/23 1501]  Encounter Vitals Group     BP (!) 166/84     Girls Systolic BP Percentile      Girls Diastolic BP Percentile      Boys Systolic BP Percentile      Boys Diastolic BP Percentile      Pulse Rate 89     Resp 18     Temp 97.7 F (36.5 C)     Temp Source Oral     SpO2 94 %     Weight 130 lb (59 kg)     Height 5' 9 (1.753 m)     Head Circumference      Peak Flow      Pain Score 0     Pain Loc      Pain Education      Exclude from Growth Chart     Most recent vital signs: Vitals:   12/29/23 1730 12/29/23 1800  BP: (!) 140/87 (!) 152/86  Pulse: 77 93  Resp: (!) 24 (!) 21  Temp:    SpO2: 97% 95%    Nursing Triage Note reviewed. Vital signs reviewed and patients oxygen saturation is normoxic  General: Patient is well  nourished, well developed, awake and alert, resting comfortably in no acute distress Head: Normocephalic and atraumatic Eyes: Normal inspection, extraocular muscles intact, no conjunctival pallor Ear, nose, throat: Normal external exam Neck: Normal range of motion Respiratory: Patient is in no respiratory distress, lungs CTAB Cardiovascular: Patient is not tachycardic, RRR without murmur appreciated GI: Abd SNT with no guarding or rebound Peritoneal dialysis in place clean dry and intact Back: Normal inspection of the back with good strength and range of motion throughout all ext Extremities: pulses intact with good cap refills, no LE pitting edema or calf tenderness Neuro: The patient is alert and oriented to person, place, and time, appropriately conversive, with 5/5 bilat UE/LE strength, no gross motor or sensory defects noted. Coordination appears to be adequate. Skin: Warm, dry, and intact Psych: normal mood and affect, no SI or HI  ED Results / Procedures / Treatments   Labs (all labs ordered are listed, but only abnormal results are displayed) Labs Reviewed  COMPREHENSIVE METABOLIC PANEL WITH GFR - Abnormal; Notable for the following components:      Result Value  Potassium 5.8 (*)    CO2 18 (*)    BUN 96 (*)    Creatinine, Ser 8.20 (*)    Calcium  7.5 (*)    Alkaline Phosphatase 179 (*)    GFR, Estimated 6 (*)    Anion gap 16 (*)    All other components within normal limits  CBC - Abnormal; Notable for the following components:   RBC 2.98 (*)    Hemoglobin 8.9 (*)    HCT 28.9 (*)    RDW 16.0 (*)    All other components within normal limits  URINALYSIS, ROUTINE W REFLEX MICROSCOPIC - Abnormal; Notable for the following components:   Color, Urine STRAW (*)    APPearance CLEAR (*)    Glucose, UA >=500 (*)    Protein, ur >=300 (*)    Bacteria, UA RARE (*)    All other components within normal limits  PHOSPHORUS - Abnormal; Notable for the following components:    Phosphorus 7.2 (*)    All other components within normal limits  MAGNESIUM      EKG EKG and rhythm strip are interpreted by myself:   EKG: [Normal sinus rhythm] at heart rate of 83, normal QRS duration, QTc 434, nonspecific ST segments and T waves no ectopy EKG not consistent with Acute STEMI Rhythm strip: NSR in lead II   RADIOLOGY None    PROCEDURES:  Critical Care performed: No  Procedures   MEDICATIONS ORDERED IN ED: Medications  sodium zirconium cyclosilicate  (LOKELMA ) packet 10 g (10 g Oral Given by Other 12/29/23 1731)  calcium  gluconate 1 g/ 50 mL sodium chloride  IVPB (0 mg Intravenous Stopped 12/29/23 1829)     IMPRESSION / MDM / ASSESSMENT AND PLAN / ED COURSE                                Differential diagnosis includes, but is not limited to, electrolyte derangement, UTI, anemia  ED course: Patient presents with similar presentation that brought him into the emergency department less than a week ago.  Blood work demonstrated a low calcium  (in contrast to what is documented in the triage note).  Patient otherwise has no physical complaints and thus his case was discussed with on-call nephrologist Dr. Marcelino who reviewed the workup thus far and agrees that patient does not need admission today.  His calcium  was repleted along with him receiving a dose of Lokelma .  He will ensure to follow-up with his nephrologist and continue his peritoneal dialysis tonight.  All questions answered to patient and wife satisfaction and they requested discharge   Clinical Course as of 12/30/23 0022  Thu Dec 29, 2023  1659 Calcium (!): 7.5 Low [HD]  1727 Nephrology paged [HD]  1728 Case discussed with Dr. Marcelino, nephrologist on call.  He agrees with given the amp of calcium  gluconate and Lokelma  (10 g) and states that the patient will be safe for discharge afterwards from the standpoint.  He will talk with his partners to determine whether patient should continue with outpatient  PD or switch to a chronic basis but he will follow-up. [HD]  1758 Phosphorus(!): 7.2 Slightly more elevated than usual but can be accounted for with peritoneal dialysis [HD]  1758 Magnesium : 1.7 No derangement [HD]  1759 Patient reexamined, voiced agreement with the plan.  He will do his dialysis tonight.  He will call his nephrologist if he does not hear from anyone in the next 48 hours.  All questions answered to patient and wife satisfaction [HD]    Clinical Course User Index [HD] Nicholaus Rolland BRAVO, MD   At time of discharge there is no evidence of acute life, limb, vision, or fertility threat. Patient has stable vital signs, pain is well controlled, patient is ambulatory and p.o. tolerant.  Discharge instructions were completed using the EPIC system. I would refer you to those at this time. All warnings prescriptions follow-up etc. were discussed in detail with the patient. Patient indicates understanding and is agreeable with this plan. All questions answered.  Patient is made aware that they may return to the emergency department for any worsening or new condition or for any other emergency.   -- Risk: 5 This patient has a high risk of morbidity due to further diagnostic testing or treatment. Rationale: This patient's evaluation and management involve a high risk of morbidity due to the potential severity of presenting symptoms, need for diagnostic testing, and/or initiation of treatment that may require close monitoring. The differential includes conditions with potential for significant deterioration or requiring escalation of care. Treatment decisions in the ED, including medication administration, procedural interventions, or disposition planning, reflect this level of risk. COPA: 5 The patient has the following acute or chronic illness/injury that poses a possible threat to life or bodily function: [X] : The patient has a potentially serious acute condition or an acute exacerbation of a  chronic illness requiring urgent evaluation and management in the Emergency Department. The clinical presentation necessitates immediate consideration of life-threatening or function-threatening diagnoses, even if they are ultimately ruled out.   FINAL CLINICAL IMPRESSION(S) / ED DIAGNOSES   Final diagnoses:  Peritoneal dialysis catheter in place  Hypocalcemia  Hyperkalemia  Serum phosphate elevated     Rx / DC Orders   ED Discharge Orders     None        Note:  This document was prepared using Dragon voice recognition software and may include unintentional dictation errors.   Nicholaus Rolland BRAVO, MD 12/30/23 [id number]

## 2023-12-29 NOTE — Discharge Instructions (Signed)
 You were seen in our emergency department for abnormal electrolyte levels.  It is very important that you do not miss any doses of your peritoneal dialysis including tonight.  Continue your regular medications.  Your nephrology team should reach out to you within the next 24 hours but if you do not hear please give them a call.  Return if any acutely worsening symptoms or any other emergency. -- RETURN PRECAUTIONS & AFTERCARE: (ENGLISH) RETURN PRECAUTIONS: Return immediately to the emergency department or see/call your doctor if you feel worse, weak or have changes in speech or vision, are short of breath, have fever, vomiting, pain, bleeding or dark stool, trouble urinating or any new issues. Return here or see/call your doctor if not improving as expected for your suspected condition. FOLLOW-UP CARE: Call your doctor and/or any doctors we referred you to for more advice and to make an appointment. Do this today, tomorrow or after the weekend. Some doctors only take PPO insurance so if you have HMO insurance you may want to contact your HMO or your regular doctor for referral to a specialist within your plan. Either way tell the doctor's office that it was a referral from the emergency department so you get the soonest possible appointment.  YOUR TEST RESULTS: Take result reports of any blood or urine tests, imaging tests and EKG's to your doctor and any referral doctor. Have any abnormal tests repeated. Your doctor or a referral doctor can let you know when this should be done. Also make sure your doctor contacts this hospital to get any test results that are not currently available such as cultures or special tests for infection and final imaging reports, which are often not available at the time you leave the ER but which may list additional important findings that are not documented on the preliminary report. BLOOD PRESSURE: If your blood pressure was greater than 120/80 have your blood pressure rechecked  within 1 to 2 weeks. MEDICATION SIDE EFFECTS: Do not drive, walk, bike, take the bus, etc. if you have received or are being prescribed any sedating medications such as those for pain or anxiety or certain antihistamines like Benadryl . If you have been give one of these here get a taxi home or have a friend drive you home. Ask your pharmacist to counsel you on potential side effects of any new medication

## 2024-01-03 ENCOUNTER — Encounter: Payer: Self-pay | Admitting: Urology

## 2024-01-03 ENCOUNTER — Ambulatory Visit: Admitting: Urology

## 2024-01-03 VITALS — BP 173/91 | HR 90 | Ht 69.0 in | Wt 130.0 lb

## 2024-01-03 DIAGNOSIS — Z8551 Personal history of malignant neoplasm of bladder: Secondary | ICD-10-CM

## 2024-01-03 DIAGNOSIS — D494 Neoplasm of unspecified behavior of bladder: Secondary | ICD-10-CM | POA: Diagnosis not present

## 2024-01-03 NOTE — Progress Notes (Signed)
   01/03/24  CC:  Chief Complaint  Patient presents with   Cysto   Urologic history:  1.  T1 high-grade urothelial carcinoma bladder Cystoscopy 02/16/2023 performed for microhematuria with papillary tumor mid posterior wall TURBT 04/05/2023: 2 cm mid posterior wall papillary tumor with a 1 cm adjacent tumor just superior.  Path: High-grade urothelial carcinoma with lamina propria invasion Induction BCG completed 08/16/2023   HPI: No complaints since last visit.  Tolerated BCG without problems.  Denies gross hematuria.  Maintenance BCG has not yet been scheduled due to BCG shortage  Blood pressure (!) 172/87, pulse 92, height 5' 9 (1.753 m), weight 130 lb (59 kg).   Cystoscopy Procedure Note  Patient identification was confirmed, informed consent was obtained, and patient was prepped using Betadine  solution.  Lidocaine  jelly was administered per urethral meatus.     Pre-Procedure: - Inspection reveals a normal caliber urethral meatus.  Procedure: The flexible cystoscope was introduced without difficulty - No urethral strictures/lesions are present. - Mild lateral lobe enlargement prostate  -  bladder neck - Bilateral ureteral orifices identified - Bladder mucosa  reveals no ulcers, tumors, or lesions.  Scar posterior wall without erythema or lesion - No bladder stones - Mild trabeculation  Retroflexion shows no abnormalities   Post-Procedure: - Patient tolerated the procedure well  Assessment/ Plan: No evidence of recurrent bladder tumor Maintenance BCG was discussed and he is interested in pursuing Maintenance BCG x 3 when available Surveillance cystoscopy March 2026    Glendia JAYSON Barba, MD

## 2024-01-04 LAB — MICROSCOPIC EXAMINATION

## 2024-01-04 LAB — URINALYSIS, COMPLETE
Bilirubin, UA: NEGATIVE
Leukocytes,UA: NEGATIVE
Nitrite, UA: NEGATIVE
Specific Gravity, UA: 1.02 (ref 1.005–1.030)
Urobilinogen, Ur: 0.2 mg/dL (ref 0.2–1.0)
pH, UA: 7 (ref 5.0–7.5)

## 2024-01-09 LAB — CYTOLOGY PLUS MONITORING PROFILE: PAP & FEULGEN

## 2024-01-10 ENCOUNTER — Ambulatory Visit: Payer: Self-pay | Admitting: Urology

## 2024-01-16 NOTE — Progress Notes (Signed)
 Established Patient Visit   Chief Complaint: Chief Complaint  Patient presents with   Follow-up    Follow up testing    Date of Service: 01/12/2024 Date of Birth: Nov 13, 1950 PCP: Casey Lynwood FALCON, MD  History of Present Illness: Casey French is a 73 y.o.male patient who returns for   1.  Inferior STEMI, Cypher stent RCA 11/22/2001  2.  Occluded distal RCA, residual 60% stenosis prox LAD, 09/03/2003  3.  Paroxysmal atrial fibrillation  4.  Essential hypertension  5.  Hyperlipidemia  6.  Type 2 diabetes  7.  3.5 x 12 mm Xience Alpine stent mid LAD at [facility] 07/21/2015  8.  ESRD, peritoneal dialysis  9.  No longer on kidney transplant list at [facility]  The patient was admitted to [facility] 1/9-1/14/22 for hypertensive emergency, AKI on CKD, mild chest pain, and GI bleed. Renal biopsy showed diabetic nephropathy. He had mildly elevated troponin secondary to hypertensive emergency. Hemoglobin was down to 7.5. 2D echocardiogram on 02/03/2020 showed normal left ventricular function with LVEF 60-65% with no wall motion abnormalities with no significant valvular abnormalities. At discharge, metoprolol  succinate was increased to 200 mg daily, but patient was unable to tolerate it due to dizziness and vision changes, and he self reduced it to 150 mg daily with resolution of symptoms. Repeat labs on 09/12/2020 per hematology showed hemoglobin 7.3, hematocrit 22.6. He is currently receiving iron  transfusion.   The patient underwent successful left hip replacement surgery 04/22/2020.  Preoperative Lexiscan  Myoview  03/06/2020 revealed LVEF 57%, with mild inferior scar with peri-infarct ischemia consistent with his known coronary anatomy.   The patient was hospitalized at [facility] 12/02/2020 with atypical chest pain, with normal high-sensitivity troponin he denies chest pain.  2D echocardiogram 12/03/2020 revealed normal left ventricular function, with LVEF 60 to 65%.   The patient underwent ETT Myoview  on 04/02/2021,  exercising for 4 minutes and 25 seconds on a Bruce protocol without chest pain.  Gated scintigraphy revealed normal left ventricular function with LVEF 57%.  SPECT imaging revealed a mild inferior ischemia, consistent with patient's known coronary anatomy, and similar to previous Myoview  in 2022.   Patient is on peritoneal dialysis, no longer on the kidney transplant list at [facility].  The patient also completed chemotherapy for bladder cancer.   [name] presents today to review results of Myoview  and echocardiogram.  At the last office visit, he reported new onset chest pain, described as chest tightness, increasing in frequency and severity in the last 2 months, with associated shortness of breath and orthopnea. He was admitted to [facility] from 11/26- 12/24/2023 for hyperkalemia of 5.8, received fluids, bicarb, and Lokelma . He reported chest pain during admission. Troponins were 177- 249- 245. Hemoglobin was 6.7, and he received 1 unit of PRBCs. Eliquis  and aspirin  were briefly held, then restarted. Echocardiogram 12/20/2023 during admission showed normal left ventricular function with LVEF 60-65% with G2DD with with mild mitral regurgitation. He returned to the ED on 12/29/2023 as sent by his dialysis team for low calcium . He reported missing 2 nights of peritoneal dialysis. Calcium  was replenished, and he was discharged. He underwent Lexiscan  Myoview  on 01/05/2024. Gated scintigraphy revealed LVEF 52%. SPECT imaging revealed a small reversible inferior wall defect of mild intensity consistent with mild ischemia; clinical correlation suggested.  He still reports experiencing orthopnea, and has chest pressure with exertion with associated shortness of breath. He denies edema, presyncope, or syncope.    The patient has paroxysmal atrial fibrillation, chads vasc score 4, on Eliquis   2.5 mg daily for stroke prevention. The patient has chronic anemia, followed by hematology, and undergoing iron  infusions.  The  patient has essential hypertension, blood pressure controlled today, currently on metoprolol  succinate, losartan , and amlodipine , which are well tolerated without apparent side effects at current doses.  The patient has hyperlipidemia; LDL cholesterol was 65 on 2/75/7976, on rosuvastatin , which is well tolerated without apparent side effects, followed by his primary care provider.    The patient has type 2 diabetes, hemoglobin A1c was 5.4 on 04/18/2022.  Past Medical and Surgical History  Past Medical History Past Medical History:  Diagnosis Date   Anemia    Atrial fibrillation (CMS/HHS-HCC)    CAD (coronary artery disease)    Decreased hearing    Diabetes mellitus type 2, uncomplicated (CMS/HHS-HCC)    Diverticulitis    GERD (gastroesophageal reflux disease)    Gout    History of alcohol use disorder    History of substance use    History of tobacco abuse    HTN (hypertension)    Hyperlipidemia    Myocardial infarction (CMS/HHS-HCC) 2003   with a stent and RCA in 2003   On apixaban  therapy    Wears dentures     Past Surgical History He has a past surgical history that includes Right hand surgery; Colonoscopy (02/2006); pneumovax (07/2011); Colonoscopy (12/14/2012); Colonoscopy (04/01/2017); arthroplasty hip total; Insertion Peritoneal Cannula/Cath For Dialysis; and EGD @ [facility] (04/24/2023).   Medications and Allergies  Current Medications  Current Outpatient Medications  Medication Sig Dispense Refill   allopurinoL  (ZYLOPRIM ) 100 MG tablet Take 1 tablet (100 mg total) by mouth once daily 90 tablet 3   amitriptyline (ELAVIL) 10 MG tablet Take 1 tablet (10 mg total) by mouth at bedtime 30 tablet 11   amLODIPine  (NORVASC ) 5 MG tablet Take 1 tablet (5 mg total) by mouth once daily 30 tablet 5   apixaban  (ELIQUIS ) 2.5 mg tablet Take 1 tablet (2.5 mg total) by mouth every 12 (twelve) hours 60 tablet 11   aspirin  81 MG EC tablet Take 81 mg by mouth once daily.      aspirin -acetaminophen -caffeine  (EXCEDRIN  MIGRAINE) 250-250-65 mg per tablet Take by mouth.     ergocalciferol , vitamin D2, 1,250 mcg (50,000 unit) capsule Take 50,000 Units by mouth every 7 (seven) days     escitalopram  oxalate (LEXAPRO ) 10 MG tablet Take 1 tablet (10 mg total) by mouth once daily for 360 days 90 tablet 3   fluticasone  (FLONASE ) 50 mcg/actuation nasal spray SHAKE WELL AND USE 2 SPRAYS IN EACH NOSTRIL EVERY DAY 16 g 5   gabapentin  (NEURONTIN ) 300 MG capsule Take 1 capsule (300 mg total) by mouth at bedtime 90 capsule 3   hydrocortisone  2.5 % cream Apply topically 2 (two) times daily. Apply after BM 20 g 1   isosorbide  mononitrate (IMDUR ) 60 MG ER tablet Take 1 tablet (60 mg total) by mouth once daily 90 tablet 3   methocarbamoL  (ROBAXIN ) 500 MG tablet Take 1 tablet (500 mg total) by mouth 4 (four) times daily 30 tablet 0   metoprolol  succinate (TOPROL -XL) 100 MG XL tablet Take 1 tablet (100 mg total) by mouth once daily 90 tablet 3   nitroGLYcerin  (NITROSTAT ) 0.4 MG SL tablet Place 1 tablet (0.4 mg total) under the tongue every 5 (five) minutes as needed for Chest pain May take up to 3 doses. 25 tablet 4   pantoprazole  (PROTONIX ) 40 MG DR tablet Take 1 tablet (40 mg total) by mouth once daily  30 tablet 3   predniSONE (DELTASONE) 10 MG tablet Take 1 tablet (10 mg total) by mouth once daily 10 tablet 0   psyllium husk, with sugar, (METAMUCIL) 3.4 gram/12 gram oral powder Take 3.4 g by mouth once daily Mix with a full glass (240 mL) of fluid.     rosuvastatin  (CRESTOR ) 40 MG tablet Take 1 tablet (40 mg total) by mouth once daily 90 tablet 3   sodium bicarbonate  650 MG tablet Take 1,300 mg by mouth 2 (two) times daily     sodium polystyrene (KAYEXALATE) powder Take 15 g by mouth once daily 454 g 0   blood glucose diagnostic test strip 1 each (1 strip total) 3 (three) times daily 100 each 11   blood glucose meter kit as directed 1 each 0   calcitRIOL  (ROCALTROL )  0.25 MCG capsule Take 0.25 mcg by mouth once daily     FUROsemide  (LASIX ) 20 MG tablet Take 1 tablet (20 mg total) by mouth once daily 30 tablet 0   lancets Use 1 each 3 (three) times daily 100 each 11   No current facility-administered medications for this visit.    Allergies: Morphine   Social and Family History  Social History  reports that he quit smoking about 22 years ago. His smoking use included cigarettes. He started smoking about 24 years ago. He has a 60.1 pack-year smoking history. He has never used smokeless tobacco. He reports that he does not currently use alcohol. He reports that he does not currently use drugs after having used the following drugs: Marijuana, MDMA (Ecstacy), Heroin, and Other-see comments.  Family History Family History  Problem Relation Name Age of Onset   Stroke Mother     Other Father         CVA   Seizures Father     Parkinsonism Brother     Lung cancer Brother     No Known Problems Daughter     No Known Problems Daughter      Review of Systems   Review of Systems: The patient denies chest pain, with mild chronic exertional shortness of breath, without orthopnea, paroxysmal nocturnal dyspnea, with chronic pedal edema, with occasional palpitations, without heart racing, presyncope, syncope, with chronic hemrrhoidal bleeding, with recurrent falls, with hip pain. Review of 8 Systems is negative except as described above.  Physical Examination   Vitals: BP 134/80   Pulse 86   Ht 172.7 cm (5' 8)   Wt 61.6 kg (135 lb 12.8 oz)   SpO2 97%   BMI 20.65 kg/m  Ht:172.7 cm (5' 8) Wt:61.6 kg (135 lb 12.8 oz) ADJ:Anib surface area is 1.72 meters squared. Body mass index is 20.65 kg/m.  General: Alert and oriented. No acute distress. HEENT: Pupils equally reactive to light and accomodation    Neck: Supple, no JVD Lungs: Normal effort of breathing; clear to auscultation bilaterally; no wheezes, rales, rhonchi Heart: Regular rate and  rhythm. No murmur, rub, or gallop Abdomen: nondistended Extremities: no cyanosis, clubbing, with minimal lower extremity edema Peripheral Pulses: 2+ radial Skin: Warm, dry, no diaphoresis  Assessment   73 y.o. male with  1. Paroxysmal atrial fibrillation (CMS/HHS-HCC)   2. Essential (primary) hypertension   3. SOB (shortness of breath) on exertion   4. Hyperlipidemia, unspecified hyperlipidemia type   5. Chronic kidney disease, stage IV (severe) (CMS/HHS-HCC)     73 year old gentleman with known coronary artery disease, status post prior coronary stent, DES of mid LAD. The patient has paroxysmal atrial  fibrillation on Eliquis  for stroke prevention.  The patient has essential hypertension, blood pressure controlled today on multiple BP medications. The patient has hyperlipidemia on Crestor . The patient has ESRD, on peritoneal dialysis, no longer on the kidney transplant list at [facility].  The patient reports a 44-month history of chest pain, shortness of breath, and orthopnea, increasing in frequency and severity over 2 months. Myoview  showed small reversible inferior wall defect of mild intensity, consistent with patient's known coronary anatomy, and similar to last two previous Myoview  studies.    Plan   1.  Continue current medications 2.  Continue rosuvastatin  for hyperlipidemia management 3.  Continue Eliquis  for stroke prevention 4.  Continue rate control strategy 5.  Trial of Lasix  20 mg daily 6.   Follow up with nephrology as scheduled 7.  Return to clinic in 2 weeks   No orders of the defined types were placed in this encounter.   Return in about 2 weeks (around 01/26/2024).  Attestation Statement:   I personally performed the service, non-incident to. (WP)   ANNA MARIA DRANE, PA-C

## 2024-01-23 ENCOUNTER — Emergency Department

## 2024-01-23 ENCOUNTER — Other Ambulatory Visit: Payer: Self-pay

## 2024-01-23 ENCOUNTER — Inpatient Hospital Stay
Admission: EM | Admit: 2024-01-23 | Discharge: 2024-01-27 | DRG: 280 | Disposition: A | Discharge status: Home / Self Care | Attending: Student in an Organized Health Care Education/Training Program | Admitting: Student in an Organized Health Care Education/Training Program

## 2024-01-23 DIAGNOSIS — Z66 Do not resuscitate: Secondary | ICD-10-CM | POA: Diagnosis present

## 2024-01-23 DIAGNOSIS — E1122 Type 2 diabetes mellitus with diabetic chronic kidney disease: Secondary | ICD-10-CM | POA: Diagnosis present

## 2024-01-23 DIAGNOSIS — N2581 Secondary hyperparathyroidism of renal origin: Secondary | ICD-10-CM | POA: Diagnosis present

## 2024-01-23 DIAGNOSIS — Z5941 Food insecurity: Secondary | ICD-10-CM

## 2024-01-23 DIAGNOSIS — Z91158 Patient's noncompliance with renal dialysis for other reason: Secondary | ICD-10-CM

## 2024-01-23 DIAGNOSIS — J9601 Acute respiratory failure with hypoxia: Secondary | ICD-10-CM | POA: Diagnosis present

## 2024-01-23 DIAGNOSIS — I21A1 Myocardial infarction type 2: Secondary | ICD-10-CM | POA: Diagnosis not present

## 2024-01-23 DIAGNOSIS — I480 Paroxysmal atrial fibrillation: Secondary | ICD-10-CM | POA: Diagnosis present

## 2024-01-23 DIAGNOSIS — Z8249 Family history of ischemic heart disease and other diseases of the circulatory system: Secondary | ICD-10-CM

## 2024-01-23 DIAGNOSIS — Z7189 Other specified counseling: Secondary | ICD-10-CM | POA: Diagnosis not present

## 2024-01-23 DIAGNOSIS — R0902 Hypoxemia: Secondary | ICD-10-CM | POA: Diagnosis not present

## 2024-01-23 DIAGNOSIS — E875 Hyperkalemia: Secondary | ICD-10-CM | POA: Diagnosis present

## 2024-01-23 DIAGNOSIS — J810 Acute pulmonary edema: Secondary | ICD-10-CM | POA: Diagnosis not present

## 2024-01-23 DIAGNOSIS — D631 Anemia in chronic kidney disease: Secondary | ICD-10-CM | POA: Diagnosis present

## 2024-01-23 DIAGNOSIS — Z992 Dependence on renal dialysis: Secondary | ICD-10-CM

## 2024-01-23 DIAGNOSIS — Z7982 Long term (current) use of aspirin: Secondary | ICD-10-CM | POA: Diagnosis not present

## 2024-01-23 DIAGNOSIS — I132 Hypertensive heart and chronic kidney disease with heart failure and with stage 5 chronic kidney disease, or end stage renal disease: Principal | ICD-10-CM | POA: Diagnosis present

## 2024-01-23 DIAGNOSIS — I2510 Atherosclerotic heart disease of native coronary artery without angina pectoris: Secondary | ICD-10-CM | POA: Diagnosis present

## 2024-01-23 DIAGNOSIS — E872 Acidosis, unspecified: Secondary | ICD-10-CM | POA: Diagnosis present

## 2024-01-23 DIAGNOSIS — Z955 Presence of coronary angioplasty implant and graft: Secondary | ICD-10-CM

## 2024-01-23 DIAGNOSIS — Z79899 Other long term (current) drug therapy: Secondary | ICD-10-CM

## 2024-01-23 DIAGNOSIS — N186 End stage renal disease: Secondary | ICD-10-CM | POA: Diagnosis present

## 2024-01-23 DIAGNOSIS — K219 Gastro-esophageal reflux disease without esophagitis: Secondary | ICD-10-CM | POA: Diagnosis present

## 2024-01-23 DIAGNOSIS — I700 Atherosclerosis of aorta: Secondary | ICD-10-CM | POA: Diagnosis present

## 2024-01-23 DIAGNOSIS — I509 Heart failure, unspecified: Secondary | ICD-10-CM | POA: Diagnosis present

## 2024-01-23 DIAGNOSIS — E785 Hyperlipidemia, unspecified: Secondary | ICD-10-CM | POA: Diagnosis present

## 2024-01-23 DIAGNOSIS — F419 Anxiety disorder, unspecified: Secondary | ICD-10-CM | POA: Diagnosis present

## 2024-01-23 DIAGNOSIS — Z961 Presence of intraocular lens: Secondary | ICD-10-CM | POA: Diagnosis present

## 2024-01-23 DIAGNOSIS — I5021 Acute systolic (congestive) heart failure: Secondary | ICD-10-CM | POA: Diagnosis not present

## 2024-01-23 DIAGNOSIS — I252 Old myocardial infarction: Secondary | ICD-10-CM | POA: Diagnosis not present

## 2024-01-23 DIAGNOSIS — Z8673 Personal history of transient ischemic attack (TIA), and cerebral infarction without residual deficits: Secondary | ICD-10-CM

## 2024-01-23 DIAGNOSIS — Z59868 Other specified financial insecurity: Secondary | ICD-10-CM

## 2024-01-23 DIAGNOSIS — F32A Depression, unspecified: Secondary | ICD-10-CM | POA: Diagnosis present

## 2024-01-23 DIAGNOSIS — R45851 Suicidal ideations: Secondary | ICD-10-CM | POA: Diagnosis present

## 2024-01-23 DIAGNOSIS — I440 Atrioventricular block, first degree: Secondary | ICD-10-CM | POA: Diagnosis not present

## 2024-01-23 DIAGNOSIS — Z885 Allergy status to narcotic agent status: Secondary | ICD-10-CM

## 2024-01-23 DIAGNOSIS — I5020 Unspecified systolic (congestive) heart failure: Secondary | ICD-10-CM | POA: Diagnosis not present

## 2024-01-23 DIAGNOSIS — Z96642 Presence of left artificial hip joint: Secondary | ICD-10-CM | POA: Diagnosis present

## 2024-01-23 DIAGNOSIS — Z87891 Personal history of nicotine dependence: Secondary | ICD-10-CM

## 2024-01-23 DIAGNOSIS — I5033 Acute on chronic diastolic (congestive) heart failure: Secondary | ICD-10-CM | POA: Diagnosis present

## 2024-01-23 DIAGNOSIS — R0602 Shortness of breath: Secondary | ICD-10-CM | POA: Diagnosis not present

## 2024-01-23 DIAGNOSIS — Z7901 Long term (current) use of anticoagulants: Secondary | ICD-10-CM

## 2024-01-23 DIAGNOSIS — Z789 Other specified health status: Secondary | ICD-10-CM | POA: Diagnosis not present

## 2024-01-23 DIAGNOSIS — Z515 Encounter for palliative care: Secondary | ICD-10-CM | POA: Diagnosis not present

## 2024-01-23 LAB — PROTIME-INR
INR: 1.2 (ref 0.8–1.2)
Prothrombin Time: 16.2 s — ABNORMAL HIGH (ref 11.4–15.2)

## 2024-01-23 LAB — BASIC METABOLIC PANEL WITH GFR
Anion gap: 18 — ABNORMAL HIGH (ref 5–15)
Anion gap: 16 — ABNORMAL HIGH (ref 5–15)
BUN: 90 mg/dL — ABNORMAL HIGH (ref 8–23)
BUN: 93 mg/dL — ABNORMAL HIGH (ref 8–23)
CO2: 13 mmol/L — ABNORMAL LOW (ref 22–32)
CO2: 13 mmol/L — ABNORMAL LOW (ref 22–32)
Calcium: 7.6 mg/dL — ABNORMAL LOW (ref 8.9–10.3)
Calcium: 7.5 mg/dL — ABNORMAL LOW (ref 8.9–10.3)
Chloride: 112 mmol/L — ABNORMAL HIGH (ref 98–111)
Chloride: 112 mmol/L — ABNORMAL HIGH (ref 98–111)
Creatinine, Ser: 9.79 mg/dL — ABNORMAL HIGH (ref 0.61–1.24)
Creatinine, Ser: 9.94 mg/dL — ABNORMAL HIGH (ref 0.61–1.24)
GFR, Estimated: 5 mL/min — ABNORMAL LOW
GFR, Estimated: 5 mL/min — ABNORMAL LOW
Glucose, Bld: 82 mg/dL (ref 70–99)
Glucose, Bld: 83 mg/dL (ref 70–99)
Potassium: 6 mmol/L — ABNORMAL HIGH (ref 3.5–5.1)
Potassium: 5.9 mmol/L — ABNORMAL HIGH (ref 3.5–5.1)
Sodium: 142 mmol/L (ref 135–145)
Sodium: 142 mmol/L (ref 135–145)

## 2024-01-23 LAB — CBC WITH DIFFERENTIAL/PLATELET
Abs Immature Granulocytes: 0.07 K/uL (ref 0.00–0.07)
Basophils Absolute: 0.1 K/uL (ref 0.0–0.1)
Basophils Relative: 1 %
Eosinophils Absolute: 0.2 K/uL (ref 0.0–0.5)
Eosinophils Relative: 2 %
HCT: 28.2 % — ABNORMAL LOW (ref 39.0–52.0)
Hemoglobin: 8.7 g/dL — ABNORMAL LOW (ref 13.0–17.0)
Immature Granulocytes: 1 %
Lymphocytes Relative: 9 %
Lymphs Abs: 0.8 K/uL (ref 0.7–4.0)
MCH: 30.2 pg (ref 26.0–34.0)
MCHC: 30.9 g/dL (ref 30.0–36.0)
MCV: 97.9 fL (ref 80.0–100.0)
Monocytes Absolute: 0.7 K/uL (ref 0.1–1.0)
Monocytes Relative: 7 %
Neutro Abs: 7.7 K/uL (ref 1.7–7.7)
Neutrophils Relative %: 80 %
Platelets: 239 K/uL (ref 150–400)
RBC: 2.88 MIL/uL — ABNORMAL LOW (ref 4.22–5.81)
RDW: 17.1 % — ABNORMAL HIGH (ref 11.5–15.5)
WBC: 9.5 K/uL (ref 4.0–10.5)
nRBC: 0 % (ref 0.0–0.2)

## 2024-01-23 LAB — APTT: aPTT: 41 s — ABNORMAL HIGH (ref 24–36)

## 2024-01-23 LAB — HEPARIN LEVEL (UNFRACTIONATED)
Heparin Unfractionated: 0.13 [IU]/mL — ABNORMAL LOW (ref 0.30–0.70)
Heparin Unfractionated: 0.29 [IU]/mL — ABNORMAL LOW (ref 0.30–0.70)

## 2024-01-23 LAB — TROPONIN T, HIGH SENSITIVITY
Troponin T High Sensitivity: 186 ng/L (ref 0–19)
Troponin T High Sensitivity: 215 ng/L (ref 0–19)

## 2024-01-23 MED ORDER — APIXABAN 2.5 MG PO TABS: 2.5000 mg | ORAL_TABLET | Freq: Two times a day (BID) | ORAL | Status: DC

## 2024-01-23 MED ORDER — PANTOPRAZOLE SODIUM 40 MG PO TBEC
40.0000 mg | DELAYED_RELEASE_TABLET | Freq: Every day | ORAL | Status: DC
  Administered 2024-01-24 – 2024-01-27 (×4): 40 mg via ORAL
  Filled 2024-01-23 (×4): qty 1

## 2024-01-23 MED ORDER — IOHEXOL 350 MG/ML SOLN
75.0000 mL | Freq: Once | INTRAVENOUS | Status: AC | PRN
  Administered 2024-01-23: 75 mL via INTRAVENOUS

## 2024-01-23 MED ORDER — CALCITRIOL 0.25 MCG PO CAPS
0.2500 ug | ORAL_CAPSULE | Freq: Every day | ORAL | Status: DC
  Administered 2024-01-24 – 2024-01-27 (×4): 0.25 ug via ORAL
  Filled 2024-01-23 (×5): qty 1

## 2024-01-23 MED ORDER — SODIUM BICARBONATE 650 MG PO TABS
1300.0000 mg | ORAL_TABLET | Freq: Two times a day (BID) | ORAL | Status: DC
  Administered 2024-01-24 – 2024-01-27 (×7): 1300 mg via ORAL
  Filled 2024-01-23 (×7): qty 2

## 2024-01-23 MED ORDER — SODIUM ZIRCONIUM CYCLOSILICATE 10 G PO PACK
10.0000 g | PACK | Freq: Once | ORAL | Status: AC
  Administered 2024-01-23: 10 g via ORAL
  Filled 2024-01-23: qty 1

## 2024-01-23 MED ORDER — SODIUM CHLORIDE 0.9% FLUSH
3.0000 mL | Freq: Two times a day (BID) | INTRAVENOUS | Status: DC
  Administered 2024-01-23 – 2024-01-27 (×8): 3 mL via INTRAVENOUS

## 2024-01-23 MED ORDER — METOPROLOL SUCCINATE ER 100 MG PO TB24
100.0000 mg | ORAL_TABLET | Freq: Every day | ORAL | Status: DC
  Administered 2024-01-24 – 2024-01-26 (×3): 100 mg via ORAL
  Filled 2024-01-23 (×2): qty 1
  Filled 2024-01-23: qty 2

## 2024-01-23 MED ORDER — HEPARIN (PORCINE) 25000 UT/250ML-% IV SOLN
900.0000 [IU]/h | INTRAVENOUS | Status: DC
  Administered 2024-01-23: 800 [IU]/h via INTRAVENOUS
  Administered 2024-01-23: 700 [IU]/h via INTRAVENOUS
  Filled 2024-01-23 (×2): qty 250

## 2024-01-23 MED ORDER — FUROSEMIDE 10 MG/ML IJ SOLN
60.0000 mg | Freq: Once | INTRAMUSCULAR | Status: AC
  Administered 2024-01-23: 60 mg via INTRAVENOUS
  Filled 2024-01-23: qty 8

## 2024-01-23 MED ORDER — ACETAMINOPHEN 325 MG PO TABS: 650.0000 mg | ORAL_TABLET | ORAL | Status: DC | PRN

## 2024-01-23 MED ORDER — ZOLPIDEM TARTRATE 5 MG PO TABS
5.0000 mg | ORAL_TABLET | Freq: Every evening | ORAL | Status: DC | PRN
  Administered 2024-01-24: 5 mg via ORAL
  Filled 2024-01-23: qty 1

## 2024-01-23 MED ORDER — ISOSORBIDE MONONITRATE ER 30 MG PO TB24
30.0000 mg | ORAL_TABLET | Freq: Every day | ORAL | Status: DC
  Administered 2024-01-23 – 2024-01-24 (×2): 30 mg via ORAL
  Filled 2024-01-23 (×2): qty 1

## 2024-01-23 MED ORDER — HEPARIN BOLUS VIA INFUSION
900.0000 [IU] | Freq: Once | INTRAVENOUS | Status: AC
  Administered 2024-01-23: 900 [IU] via INTRAVENOUS
  Filled 2024-01-23: qty 900

## 2024-01-23 MED ORDER — FUROSEMIDE 10 MG/ML IJ SOLN
60.0000 mg | Freq: Two times a day (BID) | INTRAMUSCULAR | Status: DC
  Administered 2024-01-23 – 2024-01-25 (×4): 60 mg via INTRAVENOUS
  Filled 2024-01-23 (×2): qty 6
  Filled 2024-01-23 (×2): qty 8

## 2024-01-23 MED ORDER — NITROGLYCERIN 2 % TD OINT
1.0000 [in_us] | TOPICAL_OINTMENT | Freq: Once | TRANSDERMAL | Status: AC
  Administered 2024-01-23: 1 [in_us] via TOPICAL
  Filled 2024-01-23: qty 1

## 2024-01-23 MED ORDER — SODIUM CHLORIDE 0.9% FLUSH: 3.0000 mL | INTRAVENOUS | Status: DC | PRN

## 2024-01-23 MED ORDER — DELFLEX-LC/2.5% DEXTROSE 394 MOSM/L IP SOLN
INTRAPERITONEAL | Status: DC
  Administered 2024-01-24: 6000 mL via INTRAPERITONEAL

## 2024-01-23 MED ORDER — HYDROMORPHONE HCL 1 MG/ML IJ SOLN
0.5000 mg | INTRAMUSCULAR | Status: DC | PRN
  Administered 2024-01-23 – 2024-01-25 (×5): 0.5 mg via INTRAVENOUS
  Filled 2024-01-23 (×5): qty 0.5

## 2024-01-23 MED ORDER — CALCIUM CARBONATE ANTACID 500 MG PO CHEW
2.0000 | CHEWABLE_TABLET | Freq: Three times a day (TID) | ORAL | Status: DC
  Administered 2024-01-23 – 2024-01-27 (×12): 400 mg via ORAL
  Filled 2024-01-23 (×12): qty 2

## 2024-01-23 MED ORDER — SODIUM ZIRCONIUM CYCLOSILICATE 10 G PO PACK
10.0000 g | PACK | Freq: Once | ORAL | Status: DC
  Filled 2024-01-23: qty 1

## 2024-01-23 MED ORDER — GENTAMICIN SULFATE 0.1 % EX CREA
1.0000 | TOPICAL_CREAM | Freq: Every day | CUTANEOUS | Status: DC
  Administered 2024-01-24 – 2024-01-26 (×5): 1 via TOPICAL
  Filled 2024-01-23 (×2): qty 15

## 2024-01-23 MED ORDER — ONDANSETRON HCL 4 MG/2ML IJ SOLN
4.0000 mg | Freq: Four times a day (QID) | INTRAMUSCULAR | Status: DC | PRN
  Administered 2024-01-25: 4 mg via INTRAVENOUS
  Filled 2024-01-23: qty 2

## 2024-01-23 MED ORDER — IPRATROPIUM-ALBUTEROL 0.5-2.5 (3) MG/3ML IN SOLN
3.0000 mL | Freq: Once | RESPIRATORY_TRACT | Status: AC
  Administered 2024-01-23: 3 mL via RESPIRATORY_TRACT
  Filled 2024-01-23: qty 3

## 2024-01-23 MED ORDER — HYDRALAZINE HCL 20 MG/ML IJ SOLN
5.0000 mg | Freq: Four times a day (QID) | INTRAMUSCULAR | Status: DC | PRN
  Administered 2024-01-26: 5 mg via INTRAVENOUS
  Filled 2024-01-23: qty 1

## 2024-01-23 MED ORDER — AMLODIPINE BESYLATE 10 MG PO TABS
10.0000 mg | ORAL_TABLET | Freq: Every day | ORAL | Status: DC
  Administered 2024-01-24 – 2024-01-27 (×4): 10 mg via ORAL
  Filled 2024-01-23: qty 1
  Filled 2024-01-23: qty 2
  Filled 2024-01-23 (×2): qty 1

## 2024-01-23 MED ORDER — SODIUM CHLORIDE 0.9 % IV SOLN: 250.0000 mL | INTRAVENOUS | Status: AC | PRN

## 2024-01-23 MED ORDER — ONDANSETRON HCL 4 MG/2ML IJ SOLN
4.0000 mg | Freq: Once | INTRAMUSCULAR | Status: AC
  Administered 2024-01-23: 4 mg via INTRAVENOUS
  Filled 2024-01-23: qty 2

## 2024-01-23 MED ORDER — AMLODIPINE BESYLATE 5 MG PO TABS: 10.0000 mg | ORAL_TABLET | Freq: Every day | ORAL | Status: DC

## 2024-01-23 NOTE — ED Triage Notes (Signed)
 Patient took 3 nitroglycerin  and 5 baby 81 mg asa prior to calling EMS

## 2024-01-23 NOTE — ED Notes (Signed)
 Pt placed back on bipap at this time. Pt's O2 dropped to 84-90% via 4L [place]. Pt started to complain of chest tightness as well. Pt is currently 96% and states chest tightness Is better.

## 2024-01-23 NOTE — H&P (Addendum)
 " History and Physical    Casey French:[id number] DOB: May 22, 1950 DOA: 01/23/2024  PCP: Casey Lynwood FALCON, MD (Confirm with patient/family/NH records and if not entered, this has to be entered at [facility] point of entry) Patient coming from: Home  I have personally briefly reviewed patient's old medical records in [facility]  Chief Complaint: Chest pain  HPI: Casey French is a 73 y.o. male with medical history significant of ESRD on PD MWF, PAF on Eliquis , HTN/chronic HFpEF, HLD, CAD, IIDM, presented with worsening of chest pain and shortness of breath.  Symptoms started last Thursday, patient started to feel intermittent pressure-like chest pain, associated with increasing shortness of breath.  He did not do his regular Friday PD  because I felt sick and neither did he do PD on Sunday.  Last night he could not lie flat because of shortness of breath and with increasing pressure-like chest pain.  Denied any cough no fever or chills.  ED Course: Afebrile, heart rate 95, blood pressure 170/80 O2 saturation 97%.  Chest x-ray showed pulmonary edema and patient was given IV Lasix  and put on BiPAP.  Blood work showed hemoglobin 8.7 WBC 9.5 K6.0 bicarb 13 BUN 90 creatinine 1.7 glucose 82.  Nephrology was informed for inpatient PD  Review of Systems: As per HPI otherwise 14 point review of systems negative.    Past Medical History:  Diagnosis Date   Acute diverticulitis 06/14/2014   Acute ST elevation myocardial infarction (STEMI) of inferior wall (HCC) 2003   a.) PCI with Cypher stent to the RCA   Anemia of chronic renal failure    Aortic atherosclerosis    Bladder tumor    CAP (community acquired pneumonia) 06/2022   Cerebral microvascular disease    Coronary artery disease involving native coronary artery of native heart without angina pectoris 2003   a.) PCI with Cypher stent to the RCA 2003; b.) LHC 08/2003: 100% dRCA, 60% pLAD - med mgmt; c.) LHC/PCI 08/21/2015:  occluded RCA at site of prior stent placement, 70% mLAD (FFR 0.79; 3.5 x 12 mm Xience DES), 50% dLAD (FFR 0.70); d.) MV 11/11/2016: no ischemia   Depression    Diverticulosis    End-stage renal disease on peritoneal dialysis (HCC)    GERD (gastroesophageal reflux disease)    GI bleed 05/15/2019   Gout    Heart murmur    Hematemesis 05/15/2019   Hepatic cyst    Hepatomegaly    Hyperlipidemia    Hypertension    Lacunar infarction (HCC) 02/11/2007   a.) CT head 02/11/2007: lacunar infarction involving thalamus and external capsule region on the RIGHT; b.) MRI brain 02/03/2020: multiple remote lacunar infarcts involving the RIGHT basal ganglia, RIGHT thalamus, and LEFT middle cerebellar peduncle   Long-term use of aspirin  therapy    Myocardial bridge    On apixaban  therapy    Pancolitis 09/2022   Paroxysmal atrial fibrillation (HCC)    a.) CHA2DS2VASc = 6 (age, HTN, CVA x 2, prior MI/vascular disease, T2DM) as of 03/11/2023; b.) rate/rhythm maintained on oral metoprolol  succinate; chronically anticoagulated with dose reduced apixaban    Renal cyst, right    Sigmoid diverticulitis 03/2022   T2DM (type 2 diabetes mellitus) (HCC)    Traumatic skull fracture and resulting subarachnoid hemorrhage 03/24/2013   a.) MOI was slipping and falling on ice   Unstable angina [facility])     Past Surgical History:  Procedure Laterality Date   BLADDER INSTILLATION N/A 04/05/2023   Procedure: BLADDER  INSTILLATION OF GEMCITABINE ;  Surgeon: Twylla Glendia BROCKS, MD;  Location: [facility] ORS;  Service: Urology;  Laterality: N/A;   CAPD INSERTION N/A 08/13/2020   Procedure: LAPAROSCOPIC INSERTION CONTINUOUS AMBULATORY PERITONEAL DIALYSIS  (CAPD) CATHETER;  Surgeon: Lane Shope, MD;  Location: [facility] ORS;  Service: General;  Laterality: N/A;   CARDIAC CATHETERIZATION     CATARACT EXTRACTION W/PHACO Right 07/05/2019   Procedure: CATARACT EXTRACTION PHACO AND INTRAOCULAR LENS PLACEMENT (IOC) RIGHT DIABETIC;  Surgeon:  Jaye Fallow, MD;  Location: [facility] ORS;  Service: Ophthalmology;  Laterality: Right;  US00:59.3 CDE8.41 LOT [id number] h   COLONOSCOPY WITH PROPOFOL  N/A 04/01/2017   Procedure: COLONOSCOPY WITH PROPOFOL ;  Surgeon: Gaylyn Gladis PENNER, MD;  Location: [facility] ENDOSCOPY;  Service: Endoscopy;  Laterality: N/A;   CORONARY ANGIOPLASTY WITH STENT PLACEMENT  10/2001   RCA   CORONARY ANGIOPLASTY WITH STENT PLACEMENT Left 08/21/2015   Procedure: CORONARY ANGIOPLASTY WITH STENT PLACEMENT (mid LAD); Location: [facility]; Surgeon: Zachary Car, MD   ESOPHAGOGASTRODUODENOSCOPY N/A 04/24/2023   Procedure: EGD (ESOPHAGOGASTRODUODENOSCOPY);  Surgeon: Toledo, Ladell POUR, MD;  Location: [facility] ENDOSCOPY;  Service: Gastroenterology;  Laterality: N/A;   HAND SURGERY  2015   3 screws in right hand   TOTAL HIP ARTHROPLASTY Left 04/22/2020   Procedure: TOTAL HIP ARTHROPLASTY ANTERIOR APPROACH;  Surgeon: Kathlynn Sharper, MD;  Location: [facility] ORS;  Service: Orthopedics;  Laterality: Left;   TRANSURETHRAL RESECTION OF BLADDER TUMOR N/A 04/05/2023   Procedure: TURBT (TRANSURETHRAL RESECTION OF BLADDER TUMOR);  Surgeon: Twylla Glendia BROCKS, MD;  Location: [facility] ORS;  Service: Urology;  Laterality: N/A;     reports that he quit smoking about 22 years ago. His smoking use included cigarettes. He has never used smokeless tobacco. He reports that he does not drink alcohol and does not use drugs.  Allergies[1]  Family History  Problem Relation Age of Onset   Heart disease Mother    Heart disease Father    CAD Other     Prior to Admission medications  Medication Sig Start Date End Date Taking? Authorizing Provider  escitalopram  (LEXAPRO ) 10 MG tablet Take 10 mg by mouth daily. For 360 days 01/12/24 01/06/25 Yes [provider]  furosemide  (LASIX ) 20 MG tablet Take 20 mg by mouth daily. 01/12/24 01/11/25 Yes [provider]  furosemide  (LASIX ) 80 MG tablet Take 80 mg by mouth daily. 01/13/24  Yes [provider]  ondansetron  (ZOFRAN ) 4 MG tablet Take 4 mg by mouth every 8 (eight) hours as needed. 01/16/24  Yes [provider]  acetaminophen  (TYLENOL ) 325 MG tablet Take 2 tablets (650 mg total) by mouth every 6 (six) hours as needed for mild pain (or Fever >/= 101). 10/11/22   Jens Durand, MD  acetaminophen  (TYLENOL ) 325 MG tablet Take 2 tablets (650 mg total) by mouth every 6 (six) hours as needed for mild pain (pain score 1-3) or fever (or Fever >/= 101). 12/24/23   Alexander, Natalie, DO  amLODipine  (NORVASC ) 10 MG tablet Take 10 mg by mouth daily. 09/16/23 09/15/24  [provider]  apixaban  (ELIQUIS ) 2.5 MG TABS tablet Take 2.5 mg by mouth 2 (two) times daily.    [provider]  calcitRIOL  (ROCALTROL ) 0.25 MCG capsule Take 1 capsule (0.25 mcg total) by mouth daily. 12/24/23 03/23/24  Alexander, Natalie, DO  calcium  carbonate (TUMS) 500 MG chewable tablet Chew 2 tablets by mouth 3 (three) times daily with meals. 08/05/21   [provider]  cyanocobalamin  (VITAMIN B12) 500 MCG tablet Take 1 tablet (500 mcg  total) by mouth daily. 12/24/23   Alexander, Natalie, DO  folic acid  (FOLVITE ) 1 MG tablet Take 1 tablet (1 mg total) by mouth daily. 12/24/23 03/23/24  Alexander, Natalie, DO  gabapentin  (NEURONTIN ) 300 MG capsule Take 300 mg by mouth at bedtime. 09/25/16   [provider]  hydrocortisone  (ANUSOL -HC) 2.5 % rectal cream Place 1 application rectally 2 (two) times daily as needed for hemorrhoids or anal itching.    [provider]  isosorbide  mononitrate (IMDUR ) 30 MG 24 hr tablet Take 1 tablet (30 mg total) by mouth daily as needed (Systolic BP greater than 140). 09/22/23 01/03/24  Von Bellis, MD  metoprolol  succinate (TOPROL -XL) 100 MG 24 hr tablet Take 1 tablet (100 mg total) by mouth daily with breakfast. Take with or immediately following a meal. 12/25/23   Marsa Edelman, DO  nitroGLYCERIN  (NITROSTAT ) 0.4 MG SL tablet Place 0.4 mg under the  tongue every 5 (five) minutes as needed for chest pain.    [provider]  pantoprazole  (PROTONIX ) 40 MG tablet Take 1 tablet (40 mg total) by mouth daily. 04/24/23 04/23/24  Darci Pore, MD  promethazine  (PHENERGAN ) 25 MG tablet Take 1 tablet (25 mg total) by mouth every 8 (eight) hours as needed for refractory nausea / vomiting. 12/24/23   Alexander, Natalie, DO  sodium bicarbonate  650 MG tablet Take 2 tablets (1,300 mg total) by mouth 2 (two) times daily. 12/24/23 12/23/24  Marsa Edelman, DO    Physical Exam: Vitals:   01/23/24 0518 01/23/24 0525 01/23/24 0608 01/23/24 0800  BP:  (!) 173/92  (!) 173/86  Pulse:  95  96  Resp:  (!) 33  (!) 29  Temp:  98.1 F (36.7 C)  97.9 F (36.6 C)  TempSrc:  Oral  Axillary  SpO2:  98% 98% 97%  Weight: 59.1 kg       Constitutional: NAD, calm, comfortable Vitals:   01/23/24 0518 01/23/24 0525 01/23/24 0608 01/23/24 0800  BP:  (!) 173/92  (!) 173/86  Pulse:  95  96  Resp:  (!) 33  (!) 29  Temp:  98.1 F (36.7 C)  97.9 F (36.6 C)  TempSrc:  Oral  Axillary  SpO2:  98% 98% 97%  Weight: 59.1 kg      Eyes: PERRL, lids and conjunctivae normal ENMT: Mucous membranes are moist. Posterior pharynx clear of any exudate or lesions.Normal dentition.  Neck: normal, supple, no masses, no thyromegaly Respiratory: clear to auscultation bilaterally, no wheezing, fine crackles on bilateral lower fields, increasing respiratory effort. No accessory muscle use.  Cardiovascular: Regular rate and rhythm, no murmurs / rubs / gallops. No extremity edema. 2+ pedal pulses. No carotid bruits.  Abdomen: no tenderness, no masses palpated. No hepatosplenomegaly. Bowel sounds positive.  Musculoskeletal: no clubbing / cyanosis. No joint deformity upper and lower extremities. Good ROM, no contractures. Normal muscle tone.  Skin: no rashes, lesions, ulcers. No induration Neurologic: CN 2-12 grossly intact. Sensation intact, DTR normal. Strength 5/5 in  all 4.  Psychiatric: Normal judgment and insight. Alert and oriented x 3. Normal mood.    Labs on Admission: I have personally reviewed following labs and imaging studies  CBC: Recent Labs  Lab 01/23/24 0527  WBC 9.5  NEUTROABS 7.7  HGB 8.7*  HCT 28.2*  MCV 97.9  PLT 239   Basic Metabolic Panel: Recent Labs  Lab 01/23/24 0527  NA 142  K 6.0*  CL 112*  CO2 13*  GLUCOSE 82  BUN 90*  CREATININE 9.79*  CALCIUM  7.6*   GFR: Estimated Creatinine Clearance: 5.7 mL/min (A) (by C-G formula based on SCr of 9.79 mg/dL (H)). Liver Function Tests: No results for input(s): AST, ALT, ALKPHOS, BILITOT, PROT, ALBUMIN in the last 168 hours. No results for input(s): LIPASE, AMYLASE in the last 168 hours. No results for input(s): AMMONIA in the last 168 hours. Coagulation Profile: No results for input(s): INR, PROTIME in the last 168 hours. Cardiac Enzymes: No results for input(s): CKTOTAL, CKMB, CKMBINDEX, TROPONINI in the last 168 hours. BNP (last 3 results) No results for input(s): PROBNP in the last 8760 hours. HbA1C: No results for input(s): HGBA1C in the last 72 hours. CBG: No results for input(s): GLUCAP in the last 168 hours. Lipid Profile: No results for input(s): CHOL, HDL, LDLCALC, TRIG, CHOLHDL, LDLDIRECT in the last 72 hours. Thyroid  Function Tests: No results for input(s): TSH, T4TOTAL, FREET4, T3FREE, THYROIDAB in the last 72 hours. Anemia Panel: No results for input(s): VITAMINB12, FOLATE, FERRITIN, TIBC, IRON , RETICCTPCT in the last 72 hours. Urine analysis:    Component Value Date/Time   COLORURINE STRAW (A) 12/29/2023 1504   APPEARANCEUR Clear 01/03/2024 1341   LABSPEC 1.009 12/29/2023 1504   PHURINE 8.0 12/29/2023 1504   GLUCOSEU 1+ (A) 01/03/2024 1341   HGBUR NEGATIVE 12/29/2023 1504   BILIRUBINUR Negative 01/03/2024 1341   KETONESUR NEGATIVE 12/29/2023 1504   PROTEINUR 3+ (A)  01/03/2024 1341   PROTEINUR >=300 (A) 12/29/2023 1504   NITRITE Negative 01/03/2024 1341   NITRITE NEGATIVE 12/29/2023 1504   LEUKOCYTESUR Negative 01/03/2024 1341   LEUKOCYTESUR NEGATIVE 12/29/2023 1504    Radiological Exams on Admission: CT Angio Chest PE W and/or Wo Contrast Result Date: 01/23/2024 EXAM: CTA CHEST 01/23/2024 07:59:59 AM TECHNIQUE: CTA of the chest was performed without and with the administration of 75 mL of intravenous iohexol  (OMNIPAQUE ) 350 MG/ML injection. Multiplanar reformatted images are provided for review. MIP images are provided for review. Automated exposure control, iterative reconstruction, and/or weight based adjustment of the mA/kV was utilized to reduce the radiation dose to as low as reasonably achievable. COMPARISON: 04/22/2023 aortic atherosclerotic calcification. Coronary artery calcifications. CLINICAL HISTORY: hypoxia FINDINGS: PULMONARY ARTERIES: Pulmonary arteries are adequately opacified for evaluation. No acute pulmonary embolus. Main pulmonary artery is normal in caliber. MEDIASTINUM: Coronary artery calcifications. The pericardium demonstrates no acute abnormality. Aortic atherosclerotic calcification. There is no acute abnormality of the thoracic aorta. LYMPH NODES: Right hilar lymph node has increased in size, measuring 1.3 cm. No mediastinal or axillary lymphadenopathy. LUNGS AND PLEURA: Diffuse interlobular septal thickening is identified along with ground glass attenuation, most likely representing pulmonary edema. Band-like area of consolidative change within the posterior and anterior basal right lower lobe compatible with atelectasis and/or pneumonia. Consolidative change within the posterior left base. Emphysema. Small right pleural effusion. No pneumothorax. UPPER ABDOMEN: Perihepatic and perisplenic ascites noted. SOFT TISSUES AND BONES: No acute or suspicious osseous findings. No acute soft tissue abnormality. IMPRESSION: 1. Diffuse interlobular  septal thickening and ground glass attenuation, most likely representing pulmonary edema. 2. Small right pleural effusion. 3. Band-like area of consolidative change within the posterior and anterior basal right lower lobe compatible with atelectasis and/or pneumonia. 4. Consolidative change within the posterior left base, pneumonia versus aspiration vs asymmetric edema. Electronically signed by: Waddell Calk MD 01/23/2024 08:16 AM EST RP Workstation: [id number]   DG Chest 1 View Result Date: 01/23/2024 EXAM: 1 VIEW(S) XRAY OF THE CHEST 01/23/2024 05:35:00 AM COMPARISON: 12/21/2023 CLINICAL HISTORY: 73 year old male with chest pain. FINDINGS: LUNGS  AND PLEURA: Pulmonary interstitial prominence, no overt pulmonary edema. Hazy opacity at right lung base, possible airspace consolidation or atelectasis. Small right pleural effusion. No pneumothorax. Mildly lower lung volumes. HEART AND MEDIASTINUM: Cardiomegaly. Calcified aorta. BONES AND SOFT TISSUES: No acute osseous abnormality. Negative visible bowel gas. IMPRESSION: 1. Lower lung volumes with Right basilar opacity, atelectasis vs infection, probable small effusion. Electronically signed by: Helayne Hurst MD 01/23/2024 05:48 AM EST RP Workstation: [id number]    EKG: Independently reviewed.  Sinus rhythm, no acute ST changes.  No tented T waves.  Assessment/Plan Principal Problem:   CHF (congestive heart failure) (HCC) Active Problems:   Acute pulmonary edema (HCC)  (please populate well all problems here in Problem List. (For example, if patient is on BP meds at home and you resume or decide to hold them, it is a problem that needs to be her. Same for CAD, COPD, HLD and so on)  Hyperkalemia - Received Lokelma  and IV Lasix  in the ED - Recheck potassium level this afternoon.  Nephrology consulted, Dr. Dennise to rearrange PD.  Acute on chronic HFpEF decompensation Acute hypoxic respiratory failure - Continue BiPAP - Received 1 dose of IV Lasix  -  Agreed with Nitropaste for now - Add a as needed hydralazine   Angina like chest pain Hx of CAD -Troponin trending 186> 215.  Pattern is flat implying demand ischemia secondary to CHF decompensation.  However there was a recent stress test done 2 weeks ago which showed small reversible inferior wall defect of mild intensity consistent with mild ischemia.  Cardiology reviewed patient past stress test which showed similar inferior reversible ischemia as a stress test in 2023.  Cardiology recommended 48 hours of heparin  drip.  ESRD on PD - As above.  PAF - In sinus rhythm -Switch to heparin  drip for ACS  HTN - On Nitropaste -Continue metoprolol  - As needed hydralazine   DVT prophylaxis: Eliquis  Code Status: DNR Family Communication: Wife and daughter at bedside Disposition Plan: Patient is sick with acute on chronic CHF decompensation, history of ESRD requiring emergency PD inpatient, expect more than 2 midnight hospital stay. Consults called: Nephrology, curbside consult with cardiology. Admission status: PCU admit   Cort ONEIDA Mana MD [facility] Pager 3 [phone]  01/23/2024, 11:52 AM        [1]  Allergies Allergen Reactions   Morphine  Anxiety    agitation    "

## 2024-01-23 NOTE — Progress Notes (Signed)
 PD pt with home out-pt clinic at [facility]). Navigator following to assist with any HD needs.   Suzen Satchel Dialysis Navigator [phone]

## 2024-01-23 NOTE — Progress Notes (Addendum)
 PHARMACY - ANTICOAGULATION CONSULT NOTE  Pharmacy Consult for Heparin  Infusion Indication: chest pain/ACS  Allergies[1]  Patient Measurements: Weight: 59.1 kg (130 lb 4.7 oz)  Vital Signs: Temp: 97.9 F (36.6 C) (12/29 0800) Temp Source: Axillary (12/29 0800) BP: 173/86 (12/29 0800) Pulse Rate: 96 (12/29 0800)  Labs: Recent Labs    01/23/24 0527 01/23/24 1232  HGB 8.7*  --   HCT 28.2*  --   PLT 239  --   APTT  --  41*  LABPROT  --  16.2*  INR  --  1.2  HEPARINUNFRC  --  0.13*  CREATININE 9.79* 9.94*    Estimated Creatinine Clearance: 5.6 mL/min (A) (by C-G formula based on SCr of 9.94 mg/dL (H)).   Medical History: Past Medical History:  Diagnosis Date   Acute diverticulitis 06/14/2014   Acute ST elevation myocardial infarction (STEMI) of inferior wall (HCC) 2003   a.) PCI with Cypher stent to the RCA   Anemia of chronic renal failure    Aortic atherosclerosis    Bladder tumor    CAP (community acquired pneumonia) 06/2022   Cerebral microvascular disease    Coronary artery disease involving native coronary artery of native heart without angina pectoris 2003   a.) PCI with Cypher stent to the RCA 2003; b.) LHC 08/2003: 100% dRCA, 60% pLAD - med mgmt; c.) LHC/PCI 08/21/2015: occluded RCA at site of prior stent placement, 70% mLAD (FFR 0.79; 3.5 x 12 mm Xience DES), 50% dLAD (FFR 0.70); d.) MV 11/11/2016: no ischemia   Depression    Diverticulosis    End-stage renal disease on peritoneal dialysis (HCC)    GERD (gastroesophageal reflux disease)    GI bleed 05/15/2019   Gout    Heart murmur    Hematemesis 05/15/2019   Hepatic cyst    Hepatomegaly    Hyperlipidemia    Hypertension    Lacunar infarction (HCC) 02/11/2007   a.) CT head 02/11/2007: lacunar infarction involving thalamus and external capsule region on the RIGHT; b.) MRI brain 02/03/2020: multiple remote lacunar infarcts involving the RIGHT basal ganglia, RIGHT thalamus, and LEFT middle cerebellar  peduncle   Long-term use of aspirin  therapy    Myocardial bridge    On apixaban  therapy    Pancolitis 09/2022   Paroxysmal atrial fibrillation (HCC)    a.) CHA2DS2VASc = 6 (age, HTN, CVA x 2, prior MI/vascular disease, T2DM) as of 03/11/2023; b.) rate/rhythm maintained on oral metoprolol  succinate; chronically anticoagulated with dose reduced apixaban    Renal cyst, right    Sigmoid diverticulitis 03/2022   T2DM (type 2 diabetes mellitus) (HCC)    Traumatic skull fracture and resulting subarachnoid hemorrhage 03/24/2013   a.) MOI was slipping and falling on ice   Unstable angina (HCC)     Medications:  Per chart review, it appears patient takes apixaban  2.5 mg daily- med rec pending  Assessment: Patient is a 73 year old male with a past medical history of ESRD on PD MWF, PAF on Eliquis , HTN/chronic HFpEF, HLD, CAD, IIDM, presented with worsening of chest pain and shortness of breath. Tropon trending [id number]. Per provider H&P: Pattern is flat implying demand ischemia secondary to CHF decompensation.  However there was a recent stress test done 2 weeks ago which showed small reversible inferior wall defect of mild intensity consistent with mild ischemia.  Cardiology reviewed patient past stress test which showed similar inferior reversible ischemia as a stress test in 2023.  Cardiology recommended 48 hours of heparin  drip. Pharmacy has been consulted  to start patient on a heparin  infusion.  Baseline INR 1.2, aPTT 41, and heparin  level 0.13. No signs/symptoms of bleeding noted in chart.  Goal of Therapy:  Heparin  level 0.3-0.7 units/ml aPTT 66-102 seconds Monitor platelets by anticoagulation protocol: Yes   Plan:  No bolus given recent apixaban  use Start heparin  infusion at a rate of 700 units/hr Given heparin  level is not falsely elevated, can proceed with monitor via heparin  level Check HL level in 8 hours after start of infusion Monitor CBC daily while on heparin   Lum VEAR Mania,  PharmD, BCPS 01/23/2024,2:00 PM      [1]  Allergies Allergen Reactions   Morphine  Anxiety    agitation

## 2024-01-23 NOTE — Progress Notes (Signed)
 " [facility]  ROUNDING NOTE   Subjective:   Casey French is a 73 y.o. male with past medical history of coronary artery disease, hypertension, hyperlipidemia, diabetes mellitus type 2, gout, GERD, ESRD on PD, anemia chronic kidney disease, secondary hyperparathyroidism who presented with chest pain and has been admitted for CHF (congestive heart failure) (HCC) [I50.9]  Patient is known to our practice and receives outpatient treatment through the [facility], supervised by Dr Douglas. He states his last treatment was completed on 12/25. He told ED staff that he was tired of doing dialysis and stopped. He is seen rsting on stretcher, bipap in place. When asked if that was the patient's wishes, he shook his head no. He states he would not like to stop treatment.   Potassium elevated, 6.0. Troponin 186 and Hgb 8.7.Chest xray shows right basilar opacity, atelectasis vs infection. Possible small effusion. CT angio chest suspicious for pulmonary edema.  We have consulted to manage dialysis needs.    Objective:  Vital signs in last 24 hours:  Temp:  [97.9 F (36.6 C)-98.1 F (36.7 C)] 98 F (36.7 C) (12/29 1407) Pulse Rate:  [79-96] 79 (12/29 1410) Resp:  [20-33] 20 (12/29 1410) BP: (153-173)/(82-92) 153/82 (12/29 1410) SpO2:  [97 %-100 %] 100 % (12/29 1410) FiO2 (%):  [40 %] 40 % (12/29 1407) Weight:  [59.1 kg] 59.1 kg (12/29 0518)  Weight change:  Filed Weights   01/23/24 0518  Weight: 59.1 kg    Intake/Output: No intake/output data recorded.   Intake/Output this shift:  No intake/output data recorded.  Physical Exam: General: NAD  Head: Normocephalic, atraumatic.   Eyes: Anicteric  Lungs:  Basilar crackles, bipap  Heart: Regular rate and rhythm  Abdomen:  Soft, nontender, PDC  Extremities:  No peripheral edema.  Neurologic: Awake, alert, conversant  Skin: Warm,dry, no rash  Access: [facility]    Basic Metabolic Panel: Recent Labs  Lab  01/23/24 0527 01/23/24 1232  NA 142 142  K 6.0* 5.9*  CL 112* 112*  CO2 13* 13*  GLUCOSE 82 83  BUN 90* 93*  CREATININE 9.79* 9.94*  CALCIUM  7.6* 7.5*    Liver Function Tests: No results for input(s): AST, ALT, ALKPHOS, BILITOT, PROT, ALBUMIN in the last 168 hours. No results for input(s): LIPASE, AMYLASE in the last 168 hours. No results for input(s): AMMONIA in the last 168 hours.  CBC: Recent Labs  Lab 01/23/24 0527  WBC 9.5  NEUTROABS 7.7  HGB 8.7*  HCT 28.2*  MCV 97.9  PLT 239    Cardiac Enzymes: No results for input(s): CKTOTAL, CKMB, CKMBINDEX, TROPONINI in the last 168 hours.  BNP: Invalid input(s): POCBNP  CBG: No results for input(s): GLUCAP in the last 168 hours.  Microbiology: Results for orders placed or performed in visit on 01/03/24  Microscopic Examination     Status: Abnormal   Collection Time: 01/03/24  1:41 PM   Urine  Result Value Ref Range Status   WBC, UA 0-5 0 - 5 /hpf Final   RBC, Urine 11-30 (A) 0 - 2 /hpf Final   Epithelial Cells (non renal) 0-10 0 - 10 /hpf Final   Casts Present (A) None seen /lpf Final   Cast Type Granular casts (A) N/A Final   Mucus, UA Present (A) Not Estab. Final   Bacteria, UA Few None seen/Few Final    Coagulation Studies: Recent Labs    01/23/24 1232  LABPROT 16.2*  INR 1.2  Urinalysis: No results for input(s): COLORURINE, LABSPEC, PHURINE, GLUCOSEU, HGBUR, BILIRUBINUR, KETONESUR, PROTEINUR, UROBILINOGEN, NITRITE, LEUKOCYTESUR in the last 72 hours.  Invalid input(s): APPERANCEUR    Imaging: CT Angio Chest PE W and/or Wo Contrast Result Date: 01/23/2024 EXAM: CTA CHEST 01/23/2024 07:59:59 AM TECHNIQUE: CTA of the chest was performed without and with the administration of 75 mL of intravenous iohexol  (OMNIPAQUE ) 350 MG/ML injection. Multiplanar reformatted images are provided for review. MIP images are provided for review. Automated exposure  control, iterative reconstruction, and/or weight based adjustment of the mA/kV was utilized to reduce the radiation dose to as low as reasonably achievable. COMPARISON: 04/22/2023 aortic atherosclerotic calcification. Coronary artery calcifications. CLINICAL HISTORY: hypoxia FINDINGS: PULMONARY ARTERIES: Pulmonary arteries are adequately opacified for evaluation. No acute pulmonary embolus. Main pulmonary artery is normal in caliber. MEDIASTINUM: Coronary artery calcifications. The pericardium demonstrates no acute abnormality. Aortic atherosclerotic calcification. There is no acute abnormality of the thoracic aorta. LYMPH NODES: Right hilar lymph node has increased in size, measuring 1.3 cm. No mediastinal or axillary lymphadenopathy. LUNGS AND PLEURA: Diffuse interlobular septal thickening is identified along with ground glass attenuation, most likely representing pulmonary edema. Band-like area of consolidative change within the posterior and anterior basal right lower lobe compatible with atelectasis and/or pneumonia. Consolidative change within the posterior left base. Emphysema. Small right pleural effusion. No pneumothorax. UPPER ABDOMEN: Perihepatic and perisplenic ascites noted. SOFT TISSUES AND BONES: No acute or suspicious osseous findings. No acute soft tissue abnormality. IMPRESSION: 1. Diffuse interlobular septal thickening and ground glass attenuation, most likely representing pulmonary edema. 2. Small right pleural effusion. 3. Band-like area of consolidative change within the posterior and anterior basal right lower lobe compatible with atelectasis and/or pneumonia. 4. Consolidative change within the posterior left base, pneumonia versus aspiration vs asymmetric edema. Electronically signed by: Waddell Calk MD 01/23/2024 08:16 AM EST RP Workstation: [id number]   DG Chest 1 View Result Date: 01/23/2024 EXAM: 1 VIEW(S) XRAY OF THE CHEST 01/23/2024 05:35:00 AM COMPARISON: 12/21/2023 CLINICAL  HISTORY: 73 year old male with chest pain. FINDINGS: LUNGS AND PLEURA: Pulmonary interstitial prominence, no overt pulmonary edema. Hazy opacity at right lung base, possible airspace consolidation or atelectasis. Small right pleural effusion. No pneumothorax. Mildly lower lung volumes. HEART AND MEDIASTINUM: Cardiomegaly. Calcified aorta. BONES AND SOFT TISSUES: No acute osseous abnormality. Negative visible bowel gas. IMPRESSION: 1. Lower lung volumes with Right basilar opacity, atelectasis vs infection, probable small effusion. Electronically signed by: Helayne Hurst MD 01/23/2024 05:48 AM EST RP Workstation: [id number]     Medications:    sodium chloride      dialysis solution 2.5% low-MG/low-CA     heparin  700 Units/hr (01/23/24 1229)    amLODipine   10 mg Oral Daily   calcitRIOL   0.25 mcg Oral Daily   calcium  carbonate  2 tablet Oral TID WC   furosemide   60 mg Intravenous Q12H   gentamicin  cream  1 Application Topical Daily   [START ON 01/24/2024] metoprolol  succinate  100 mg Oral Q breakfast   pantoprazole   40 mg Oral Daily   sodium bicarbonate   1,300 mg Oral BID   sodium chloride  flush  3 mL Intravenous Q12H   sodium zirconium cyclosilicate   10 g Oral Once   sodium chloride , acetaminophen , hydrALAZINE , ondansetron  (ZOFRAN ) IV, sodium chloride  flush, zolpidem   Assessment/ Plan:  Mr. AKIEM URIETA is a 73 y.o.  male with past medical history of coronary artery disease, hypertension, hyperlipidemia, diabetes mellitus type 2, gout, GERD, ESRD on PD, anemia chronic kidney disease, secondary hyperparathyroidism  who presented with chest pain and has been admitted for CHF (congestive heart failure) (HCC) [I50.9]  [facility]  End stage renal disease on Peritoneal dialysis. Last treatment completed on 12/25. Concerned that patient does not want to continue dialysis. Patient states he wants to continue. Offered to transition to in center or respite services via outpatient home  clinic. Patient refused, and would like to continue current modality. Will order treatment tonight with 2.5% dialysate for fluid removal.   2. Acute respiratory failure, Pulmonary edema noted on CT angio chest. Missed multiple dialysis treatments. Will perform dialysis tonight per home schedule. Also continue IV Furosemide  60mg  twice daily.   3. Hyperkalemia, potassium 6.0, due to lack of dialysis. Given Lokelma  this morning, will order dose this evening also.   4. Anemia of chronic kidney disease Lab Results  Component Value Date   HGB 8.7 (L) 01/23/2024    Hgb stable, will monitor and assess need for ESA    LOS: 0 Jamell Opfer 12/29/20254:34 PM   "

## 2024-01-23 NOTE — ED Provider Notes (Signed)
 Care assumed of patient from outgoing provider.  See their note for initial history, exam and plan.  Clinical Course as of 01/23/24 0847  Mon Jan 23, 2024  0708 ESRD on PD, CAD - no PD over the past couple of days. Noncompliance with meds. Chest pain and hypoxia. CXR reassuring but concern for edema. Nitroglycerine and bipap.   []  CTA PE f/u []  CXR w/ pna? - no symptoms, no abx at this point []  f/u trop  Given lokelma  given w/ EKG changes of peaked t waves.  Plan to discuss with nephro and admit  [SM]  [phone] Patient Dors is mild cough but no significant fever or chills.  CTA with findings concerning for pulmonary edema and either atelectasis versus focal findings of pneumonia.  No leukocytosis or fever.  Consulted hospitalist for admission for acute hypoxic respiratory failure and hyperkalemia. Previous doctor discussed with nephrology who will follow with patient. [SM]    Clinical Course User Index [SM] Suzanne Kirsch, MD     Suzanne Kirsch, MD 01/23/24 [phone]

## 2024-01-23 NOTE — Progress Notes (Signed)
 PHARMACY - ANTICOAGULATION CONSULT NOTE  Pharmacy Consult for Heparin  Infusion Indication: chest pain/ACS  Allergies[1]  Patient Measurements: Weight: 60.1 kg (132 lb 7.9 oz)  Vital Signs: Temp: 98.1 F (36.7 C) (12/29 1900) Temp Source: Oral (12/29 1900) BP: 153/95 (12/29 2100) Pulse Rate: 83 (12/29 2100)  Labs: Recent Labs    01/23/24 0527 01/23/24 1232 01/23/24 1914  HGB 8.7*  --   --   HCT 28.2*  --   --   PLT 239  --   --   APTT  --  41*  --   LABPROT  --  16.2*  --   INR  --  1.2  --   HEPARINUNFRC  --  0.13* 0.29*  CREATININE 9.79* 9.94*  --     Estimated Creatinine Clearance: 5.7 mL/min (A) (by C-G formula based on SCr of 9.94 mg/dL (H)).   Medical History: Past Medical History:  Diagnosis Date   Acute diverticulitis 06/14/2014   Acute ST elevation myocardial infarction (STEMI) of inferior wall (HCC) 2003   a.) PCI with Cypher stent to the RCA   Anemia of chronic renal failure    Aortic atherosclerosis    Bladder tumor    CAP (community acquired pneumonia) 06/2022   Cerebral microvascular disease    Coronary artery disease involving native coronary artery of native heart without angina pectoris 2003   a.) PCI with Cypher stent to the RCA 2003; b.) LHC 08/2003: 100% dRCA, 60% pLAD - med mgmt; c.) LHC/PCI 08/21/2015: occluded RCA at site of prior stent placement, 70% mLAD (FFR 0.79; 3.5 x 12 mm Xience DES), 50% dLAD (FFR 0.70); d.) MV 11/11/2016: no ischemia   Depression    Diverticulosis    End-stage renal disease on peritoneal dialysis (HCC)    GERD (gastroesophageal reflux disease)    GI bleed 05/15/2019   Gout    Heart murmur    Hematemesis 05/15/2019   Hepatic cyst    Hepatomegaly    Hyperlipidemia    Hypertension    Lacunar infarction (HCC) 02/11/2007   a.) CT head 02/11/2007: lacunar infarction involving thalamus and external capsule region on the RIGHT; b.) MRI brain 02/03/2020: multiple remote lacunar infarcts involving the RIGHT basal  ganglia, RIGHT thalamus, and LEFT middle cerebellar peduncle   Long-term use of aspirin  therapy    Myocardial bridge    On apixaban  therapy    Pancolitis 09/2022   Paroxysmal atrial fibrillation (HCC)    a.) CHA2DS2VASc = 6 (age, HTN, CVA x 2, prior MI/vascular disease, T2DM) as of 03/11/2023; b.) rate/rhythm maintained on oral metoprolol  succinate; chronically anticoagulated with dose reduced apixaban    Renal cyst, right    Sigmoid diverticulitis 03/2022   T2DM (type 2 diabetes mellitus) (HCC)    Traumatic skull fracture and resulting subarachnoid hemorrhage 03/24/2013   a.) MOI was slipping and falling on ice   Unstable angina (HCC)     Medications:  Per chart review, it appears patient takes apixaban  2.5 mg daily- med rec pending  Assessment: Patient is a 73 year old male with a past medical history of ESRD on PD MWF, PAF on Eliquis , HTN/chronic HFpEF, HLD, CAD, IIDM, presented with worsening of chest pain and shortness of breath. Tropon trending [id number]. Per provider H&P: Pattern is flat implying demand ischemia secondary to CHF decompensation.  However there was a recent stress test done 2 weeks ago which showed small reversible inferior wall defect of mild intensity consistent with mild ischemia.  Cardiology reviewed patient past stress test  which showed similar inferior reversible ischemia as a stress test in 2023.  Cardiology recommended 48 hours of heparin  drip. Pharmacy has been consulted to start patient on a heparin  infusion.  Baseline INR 1.2, aPTT 41, and heparin  level 0.13. No signs/symptoms of bleeding noted in chart.  Goal of Therapy:  Heparin  level 0.3-0.7 units/ml aPTT 66-102 seconds Monitor platelets by anticoagulation protocol: Yes   Plan:  12/29:  HL @ 1914 = 0.29, SUBtherapeutic - will order heparin  900 units IV X 1 bolus and increase drip rate to 800 units/hr - will recheck HL 8 hrs after rate change  - Monitor CBC daily while on heparin   Casey French D,  PharmD 01/23/2024,10:37 PM       [1]  Allergies Allergen Reactions   Morphine  Anxiety    agitation

## 2024-01-23 NOTE — ED Notes (Signed)
 Pt wanted to remove Bipap due to pressure from mask on the top of his nose. Pt does have some redness to the top of his nose. Pt placed on 4L via [place]. Pt's O2 saturation is 100%.

## 2024-01-23 NOTE — ED Provider Notes (Signed)
 "  [facility] Provider Note    Event Date/Time   First MD Initiated Contact with Patient 01/23/24 [phone]     (approximate)   History   Chest pain   HPI  REMBERTO French is a 73 y.o. male who presents to the emergency department today because of concerns for chest pain and some shortness of breath.  Patient has a history of end-stage renal disease and is supposed to be on peritoneal dialysis.  He states he has not given himself his dialysis in the past 3 to 4 days because he is tired of it.  Additionally patient has history of cardiac disease but intermittently takes his medication.  The patient symptoms started last night.  They progressively got worse.     Physical Exam   Triage Vital Signs: ED Triage Vitals  Encounter Vitals Group     BP --      Girls Systolic BP Percentile --      Girls Diastolic BP Percentile --      Boys Systolic BP Percentile --      Boys Diastolic BP Percentile --      Pulse --      Resp --      Temp --      Temp src --      SpO2 --      Weight 01/23/24 0518 130 lb 4.7 oz (59.1 kg)     Height --      Head Circumference --      Peak Flow --      Pain Score 01/23/24 0519 9     Pain Loc --      Pain Education --      Exclude from Growth Chart --     Most recent vital signs: Vitals:   01/23/24 0525 01/23/24 0608  BP: (!) 173/92   Pulse: 95   Resp: (!) 33   Temp: 98.1 F (36.7 C)   SpO2: 98% 98%    General: Awake, alert, oriented. CV:  Good peripheral perfusion. Regular rate and rhythm. Resp:  Tachypnea. Poor air movement. Abd:  No distention. Non tender.   ED Results / Procedures / Treatments   Labs (all labs ordered are listed, but only abnormal results are displayed) Labs Reviewed  CBC WITH DIFFERENTIAL/PLATELET - Abnormal; Notable for the following components:      Result Value   RBC 2.88 (*)    Hemoglobin 8.7 (*)    HCT 28.2 (*)    RDW 17.1 (*)    All other components within normal limits  BASIC  METABOLIC PANEL WITH GFR - Abnormal; Notable for the following components:   Potassium 6.0 (*)    Chloride 112 (*)    CO2 13 (*)    BUN 90 (*)    Creatinine, Ser 9.79 (*)    Calcium  7.6 (*)    GFR, Estimated 5 (*)    Anion gap 18 (*)    All other components within normal limits  TROPONIN T, HIGH SENSITIVITY - Abnormal; Notable for the following components:   Troponin T High Sensitivity 186 (*)    All other components within normal limits  TROPONIN T, HIGH SENSITIVITY     EKG  I, Casey French, attending physician, personally viewed and interpreted this EKG  EKG Time: 0527 Rate: 92 Rhythm: sinus rhythm Axis: normal Intervals: qtc 441 QRS: narrow, LVH ST changes: no st elevation Impression: abnormal ekg    RADIOLOGY I independently interpreted and  visualized the CXR. My interpretation: opacity in right lower lung Radiology interpretation:  IMPRESSION:  1. Lower lung volumes with Right basilar opacity, atelectasis vs infection,  probable small effusion.     PROCEDURES:  Critical Care performed: Yes  CRITICAL CARE Performed by: Casey French   Total critical care time: 40 minutes  Critical care time was exclusive of separately billable procedures and treating other patients.  Critical care was necessary to treat or prevent imminent or life-threatening deterioration.  Critical care was time spent personally by me on the following activities: development of treatment plan with patient and/or surrogate as well as nursing, discussions with consultants, evaluation of patient's response to treatment, examination of patient, obtaining history from patient or surrogate, ordering and performing treatments and interventions, ordering and review of laboratory studies, ordering and review of radiographic studies, pulse oximetry and re-evaluation of patient's condition.    MEDICATIONS ORDERED IN ED: Medications - No data to display   IMPRESSION / MDM / ASSESSMENT AND  PLAN / ED COURSE  I reviewed the triage vital signs and the nursing notes.                              Differential diagnosis includes, but is not limited to, pneumonia, viral URI, PE, edema  Patient's presentation is most consistent with acute presentation with potential threat to life or bodily function.   The patient is on the cardiac monitor to evaluate for evidence of arrhythmia and/or significant heart rate changes.  Patient presented to the emergency department today because concerns for chest pain and shortness of breath.  Patient does state that he has missed the past 3 to 4 days in terms of his peritoneal dialysis.  Upon initial exam patient did have some increased work of breathing.  I did initiate workup including chest x-ray and blood work.  However somewhat shortly after arrival to the emergency department his breathing became significantly worse.  He dropped his oxygen saturation into the 80s.  His blood pressure was noted to be significantly elevated.  I did have concern for possible pulmonary edema.  Chest x-ray had not shown any significant anemia however I did ask for the patient be placed on BiPAP.  Wrote for Nitropaste and Lasix .  Patient's blood work shows mild hyperkalemia.  He was written for Lokelma .  Additionally troponin was elevated, however per chart review patient has history of elevated trop. I discussed with Dr. Dennise with nephrology to help expedite dialysis.  Additionally wrote for a CT angio to assure that there is no other cause for the hypoxia.  Chest x-ray had read a possible Monia however the patient is afebrile, no leukocytosis.  Will defer treatment unless CT scan is consistent with pneumonia.  Awaiting CT scan at time of signout.  FINAL CLINICAL IMPRESSION(S) / ED DIAGNOSES   Final diagnoses:  Hypoxia     Note:  This document was prepared using Dragon voice recognition software and may include unintentional dictation errors.    French Guadalupe,  MD 01/23/24 [phone]  "

## 2024-01-23 NOTE — ED Notes (Signed)
 Informed ER doctor that the patient was place on 5 L NS for feeling SOB, and dropping his oxygen level to 89%, patient also had increase work of breathing.

## 2024-01-24 DIAGNOSIS — Z515 Encounter for palliative care: Secondary | ICD-10-CM

## 2024-01-24 DIAGNOSIS — Z66 Do not resuscitate: Secondary | ICD-10-CM

## 2024-01-24 DIAGNOSIS — J810 Acute pulmonary edema: Secondary | ICD-10-CM | POA: Diagnosis not present

## 2024-01-24 DIAGNOSIS — Z7189 Other specified counseling: Secondary | ICD-10-CM

## 2024-01-24 DIAGNOSIS — Z789 Other specified health status: Secondary | ICD-10-CM

## 2024-01-24 DIAGNOSIS — R45851 Suicidal ideations: Secondary | ICD-10-CM

## 2024-01-24 LAB — BASIC METABOLIC PANEL WITH GFR
Anion gap: 13 (ref 5–15)
BUN: 77 mg/dL — ABNORMAL HIGH (ref 8–23)
CO2: 19 mmol/L — ABNORMAL LOW (ref 22–32)
Calcium: 7.8 mg/dL — ABNORMAL LOW (ref 8.9–10.3)
Chloride: 108 mmol/L (ref 98–111)
Creatinine, Ser: 8.58 mg/dL — ABNORMAL HIGH (ref 0.61–1.24)
GFR, Estimated: 6 mL/min — ABNORMAL LOW
Glucose, Bld: 118 mg/dL — ABNORMAL HIGH (ref 70–99)
Potassium: 4.8 mmol/L (ref 3.5–5.1)
Sodium: 140 mmol/L (ref 135–145)

## 2024-01-24 LAB — CBC
HCT: 24.5 % — ABNORMAL LOW (ref 39.0–52.0)
Hemoglobin: 7.7 g/dL — ABNORMAL LOW (ref 13.0–17.0)
MCH: 30.3 pg (ref 26.0–34.0)
MCHC: 31.4 g/dL (ref 30.0–36.0)
MCV: 96.5 fL (ref 80.0–100.0)
Platelets: 202 K/uL (ref 150–400)
RBC: 2.54 MIL/uL — ABNORMAL LOW (ref 4.22–5.81)
RDW: 17.1 % — ABNORMAL HIGH (ref 11.5–15.5)
WBC: 7 K/uL (ref 4.0–10.5)
nRBC: 0 % (ref 0.0–0.2)

## 2024-01-24 LAB — HEPARIN LEVEL (UNFRACTIONATED)
Heparin Unfractionated: 0.3 [IU]/mL (ref 0.30–0.70)
Heparin Unfractionated: 0.26 [IU]/mL — ABNORMAL LOW (ref 0.30–0.70)

## 2024-01-24 LAB — TROPONIN T, HIGH SENSITIVITY: Troponin T High Sensitivity: 271 ng/L (ref 0–19)

## 2024-01-24 LAB — HEPATITIS B SURFACE ANTIGEN: Hepatitis B Surface Ag: NONREACTIVE

## 2024-01-24 MED ORDER — EPOETIN ALFA-EPBX 10000 UNIT/ML IJ SOLN
20000.0000 [IU] | INTRAMUSCULAR | Status: DC
  Filled 2024-01-24 (×2): qty 2

## 2024-01-24 MED ORDER — HEPARIN BOLUS VIA INFUSION
900.0000 [IU] | Freq: Once | INTRAVENOUS | Status: AC
  Administered 2024-01-24: 900 [IU] via INTRAVENOUS
  Filled 2024-01-24: qty 900

## 2024-01-24 MED ORDER — ISOSORBIDE MONONITRATE ER 60 MG PO TB24
120.0000 mg | ORAL_TABLET | Freq: Every day | ORAL | Status: DC
  Administered 2024-01-25: 120 mg via ORAL
  Filled 2024-01-24: qty 2

## 2024-01-24 MED ORDER — METOPROLOL TARTRATE 5 MG/5ML IV SOLN
5.0000 mg | INTRAVENOUS | Status: AC
  Filled 2024-01-24: qty 5

## 2024-01-24 NOTE — Evaluation (Signed)
 Physical Therapy Evaluation Patient Details Name: Casey French MRN: [id number] DOB: 1950-08-21 Today's Date: 01/24/2024  History of Present Illness  Pt is a 73 year old male admitted with Acute on chronic HFpEF decompensation, Acute hypoxic respiratory failure, hyperkalemia       PMH significant for ESRD on PD MWF, PAF on Eliquis , HTN/chronic HFpEF, HLD, CAD, IIDM  Clinical Impression  Patient is agreeable to PT evaluation. He is independent at baseline and lives with his spouse.  Today the patient required no physical assistance for bed mobility. Sitting balance and standing balance are good without assist required. He is able to take a few steps without loss of balance. Further mobility deferred due to heart rate in the 130's with activity. Sp02 100% with no significant shortness of breath noted with activity. Patient reports feeling better with movement. PT will follow up while in the hospital to maximize independence.       If plan is discharge home, recommend the following: Assist for transportation   Can travel by private vehicle        Equipment Recommendations None recommended by PT  Recommendations for Other Services       Functional Status Assessment Patient has had a recent decline in their functional status and demonstrates the ability to make significant improvements in function in a reasonable and predictable amount of time.     Precautions / Restrictions Precautions Precautions: Fall Recall of Precautions/Restrictions: Intact Restrictions Weight Bearing Restrictions Per Provider Order: No      Mobility  Bed Mobility Overal bed mobility: Needs Assistance Bed Mobility: Supine to Sit     Supine to sit: Supervision     General bed mobility comments: no physical assistance required    Transfers Overall transfer level: Needs assistance Equipment used: None Transfers: Sit to/from Stand Sit to Stand: Supervision                 Ambulation/Gait             Pre-gait activities: patient able to take several side steps without loss of balance. further ambulation deferred due to elevated heart rate in the 130's    Stairs            Wheelchair Mobility     Tilt Bed    Modified Rankin (Stroke Patients Only)       Balance Overall balance assessment: Needs assistance Sitting-balance support: Feet supported Sitting balance-Leahy Scale: Normal     Standing balance support: No upper extremity supported Standing balance-Leahy Scale: Good                               Pertinent Vitals/Pain Pain Assessment Pain Assessment: Faces Faces Pain Scale: Hurts a little bit Pain Location: chest tightness (not new) Pain Descriptors / Indicators: Tightness Pain Intervention(s): Limited activity within patient's tolerance, Monitored during session    Home Living Family/patient expects to be discharged to:: Private residence Living Arrangements: Spouse/significant other Available Help at Discharge: Family;Available 24 hours/day Type of Home: House Home Access: Stairs to enter Entrance Stairs-Rails: Right;Left;Can reach both Entrance Stairs-Number of Steps: 4   Home Layout: One level Home Equipment: Agricultural [employer]ant (2 wheels);Cane - single point;BSC/3in1      Prior Function Prior Level of Function : Driving;Independent/Modified Independent                     Extremity/Trunk Assessment   Upper Extremity Assessment Upper Extremity  Assessment: Overall WFL for tasks assessed    Lower Extremity Assessment Lower Extremity Assessment: Defer to PT evaluation       Communication   Communication Communication: No apparent difficulties    Cognition Arousal: Alert Behavior During Therapy: WFL for tasks assessed/performed   PT - Cognitive impairments: No apparent impairments                         Following commands: Intact       Cueing Cueing Techniques:  Verbal cues     General Comments General comments (skin integrity, edema, etc.): patient reports feeling better with mobility. Sp02 100%. heart rate is variable and elevated into the 130's    Exercises     Assessment/Plan    PT Assessment Patient needs continued PT services  PT Problem List Decreased activity tolerance;Decreased mobility;Cardiopulmonary status limiting activity       PT Treatment Interventions DME instruction;Gait training;Stair training;Functional mobility training;Therapeutic activities;Therapeutic exercise;Patient/family education    PT Goals (Current goals can be found in the Care Plan section)  Acute Rehab PT Goals Patient Stated Goal: home PT Goal Formulation: With patient Time For Goal Achievement: 02/07/24 Potential to Achieve Goals: Good    Frequency Min 1X/week     Co-evaluation   Reason for Co-Treatment:  (tolerance)           AM-PAC PT 6 Clicks Mobility  Outcome Measure Help needed turning from your back to your side while in a flat bed without using bedrails?: None Help needed moving from lying on your back to sitting on the side of a flat bed without using bedrails?: None Help needed moving to and from a bed to a chair (including a wheelchair)?: A Little Help needed standing up from a chair using your arms (e.g., wheelchair or bedside chair)?: A Little Help needed to walk in hospital room?: A Little Help needed climbing 3-5 steps with a railing? : A Little 6 Click Score: 20    End of Session Equipment Utilized During Treatment: Oxygen Activity Tolerance: Patient tolerated treatment well Patient left:  (seated on bed for breakfast per patient preference) Nurse Communication: Mobility status PT Visit Diagnosis: Muscle weakness (generalized) (M62.81);Unsteadiness on feet (R26.81)    Time: 9092-9082 PT Time Calculation (min) (ACUTE ONLY): 10 min   Charges:   PT Evaluation $PT Eval Low Complexity: 1 Low   PT General Charges $$  ACUTE PT VISIT: 1 Visit         Randine Essex, PT, MPT  Randine LULLA Essex 01/24/2024, 10:29 AM

## 2024-01-24 NOTE — Progress Notes (Signed)
 " [facility]  ROUNDING NOTE   Subjective:   Casey French is a 73 y.o. male with past medical history of coronary artery disease, hypertension, hyperlipidemia, diabetes mellitus type 2, gout, GERD, ESRD on PD, anemia chronic kidney disease, secondary hyperparathyroidism who presented with chest pain and has been admitted for CHF (congestive heart failure) (HCC) [I50.9] Hypoxia [R09.02]  Patient is known to our practice and receives outpatient treatment through the [facility], supervised by Dr Douglas. He states his last treatment was completed on 12/25.   Update Patient sitting up in bed Appears more comfortable today Remains on 3L Denies shortness of breath   Objective:  Vital signs in last 24 hours:  Temp:  [97.6 F (36.4 C)-98.1 F (36.7 C)] 98 F (36.7 C) (12/30 1508) Pulse Rate:  [67-122] 89 (12/30 1508) Resp:  [10-44] 20 (12/30 1508) BP: (124-184)/(74-103) 126/74 (12/30 1508) SpO2:  [90 %-100 %] 100 % (12/30 1508) FiO2 (%):  [40 %] 40 % (12/29 2139) Weight:  [60.1 kg] 60.1 kg (12/29 1900)  Weight change: 1 kg Filed Weights   01/23/24 0518 01/23/24 1900  Weight: 59.1 kg 60.1 kg    Intake/Output: I/O last 3 completed shifts: In: -  Out: 400 [Urine:400]   Intake/Output this shift:  Total I/O In: -674  Out: 625 [Urine:625]  Physical Exam: General: NAD  Head: Normocephalic, atraumatic.   Eyes: Anicteric  Lungs:  Diminished, [place] O2  Heart: Regular rate and rhythm  Abdomen:  Soft, nontender, PDC  Extremities:  No peripheral edema.  Neurologic: Awake, alert, conversant  Skin: Warm,dry, no rash  Access: [facility]    Basic Metabolic Panel: Recent Labs  Lab 01/23/24 0527 01/23/24 1232 01/24/24 0453  NA 142 142 140  K 6.0* 5.9* 4.8  CL 112* 112* 108  CO2 13* 13* 19*  GLUCOSE 82 83 118*  BUN 90* 93* 77*  CREATININE 9.79* 9.94* 8.58*  CALCIUM  7.6* 7.5* 7.8*    Liver Function Tests: No results for input(s): AST, ALT,  ALKPHOS, BILITOT, PROT, ALBUMIN in the last 168 hours. No results for input(s): LIPASE, AMYLASE in the last 168 hours. No results for input(s): AMMONIA in the last 168 hours.  CBC: Recent Labs  Lab 01/23/24 0527 01/24/24 0453  WBC 9.5 7.0  NEUTROABS 7.7  --   HGB 8.7* 7.7*  HCT 28.2* 24.5*  MCV 97.9 96.5  PLT 239 202    Cardiac Enzymes: No results for input(s): CKTOTAL, CKMB, CKMBINDEX, TROPONINI in the last 168 hours.  BNP: Invalid input(s): POCBNP  CBG: No results for input(s): GLUCAP in the last 168 hours.  Microbiology: Results for orders placed or performed in visit on 01/03/24  Microscopic Examination     Status: Abnormal   Collection Time: 01/03/24  1:41 PM   Urine  Result Value Ref Range Status   WBC, UA 0-5 0 - 5 /hpf Final   RBC, Urine 11-30 (A) 0 - 2 /hpf Final   Epithelial Cells (non renal) 0-10 0 - 10 /hpf Final   Casts Present (A) None seen /lpf Final   Cast Type Granular casts (A) N/A Final   Mucus, UA Present (A) Not Estab. Final   Bacteria, UA Few None seen/Few Final    Coagulation Studies: Recent Labs    01/23/24 1232  LABPROT 16.2*  INR 1.2    Urinalysis: No results for input(s): COLORURINE, LABSPEC, PHURINE, GLUCOSEU, HGBUR, BILIRUBINUR, KETONESUR, PROTEINUR, UROBILINOGEN, NITRITE, LEUKOCYTESUR in the last 72 hours.  Invalid input(s):  APPERANCEUR    Imaging: CT Angio Chest PE W and/or Wo Contrast Result Date: 01/23/2024 EXAM: CTA CHEST 01/23/2024 07:59:59 AM TECHNIQUE: CTA of the chest was performed without and with the administration of 75 mL of intravenous iohexol  (OMNIPAQUE ) 350 MG/ML injection. Multiplanar reformatted images are provided for review. MIP images are provided for review. Automated exposure control, iterative reconstruction, and/or weight based adjustment of the mA/kV was utilized to reduce the radiation dose to as low as reasonably achievable. COMPARISON: 04/22/2023 aortic  atherosclerotic calcification. Coronary artery calcifications. CLINICAL HISTORY: hypoxia FINDINGS: PULMONARY ARTERIES: Pulmonary arteries are adequately opacified for evaluation. No acute pulmonary embolus. Main pulmonary artery is normal in caliber. MEDIASTINUM: Coronary artery calcifications. The pericardium demonstrates no acute abnormality. Aortic atherosclerotic calcification. There is no acute abnormality of the thoracic aorta. LYMPH NODES: Right hilar lymph node has increased in size, measuring 1.3 cm. No mediastinal or axillary lymphadenopathy. LUNGS AND PLEURA: Diffuse interlobular septal thickening is identified along with ground glass attenuation, most likely representing pulmonary edema. Band-like area of consolidative change within the posterior and anterior basal right lower lobe compatible with atelectasis and/or pneumonia. Consolidative change within the posterior left base. Emphysema. Small right pleural effusion. No pneumothorax. UPPER ABDOMEN: Perihepatic and perisplenic ascites noted. SOFT TISSUES AND BONES: No acute or suspicious osseous findings. No acute soft tissue abnormality. IMPRESSION: 1. Diffuse interlobular septal thickening and ground glass attenuation, most likely representing pulmonary edema. 2. Small right pleural effusion. 3. Band-like area of consolidative change within the posterior and anterior basal right lower lobe compatible with atelectasis and/or pneumonia. 4. Consolidative change within the posterior left base, pneumonia versus aspiration vs asymmetric edema. Electronically signed by: Waddell Calk MD 01/23/2024 08:16 AM EST RP Workstation: [id number]   DG Chest 1 View Result Date: 01/23/2024 EXAM: 1 VIEW(S) XRAY OF THE CHEST 01/23/2024 05:35:00 AM COMPARISON: 12/21/2023 CLINICAL HISTORY: 73 year old male with chest pain. FINDINGS: LUNGS AND PLEURA: Pulmonary interstitial prominence, no overt pulmonary edema. Hazy opacity at right lung base, possible airspace  consolidation or atelectasis. Small right pleural effusion. No pneumothorax. Mildly lower lung volumes. HEART AND MEDIASTINUM: Cardiomegaly. Calcified aorta. BONES AND SOFT TISSUES: No acute osseous abnormality. Negative visible bowel gas. IMPRESSION: 1. Lower lung volumes with Right basilar opacity, atelectasis vs infection, probable small effusion. Electronically signed by: Helayne Hurst MD 01/23/2024 05:48 AM EST RP Workstation: [id number]     Medications:    dialysis solution 2.5% low-MG/low-CA     heparin  900 Units/hr (01/24/24 1502)    amLODipine   10 mg Oral Daily   calcitRIOL   0.25 mcg Oral Daily   calcium  carbonate  2 tablet Oral TID WC   furosemide   60 mg Intravenous Q12H   gentamicin  cream  1 Application Topical Daily   isosorbide  mononitrate  30 mg Oral Daily   metoprolol  succinate  100 mg Oral Q breakfast   metoprolol  tartrate  5 mg Intravenous NOW   pantoprazole   40 mg Oral Daily   sodium bicarbonate   1,300 mg Oral BID   sodium chloride  flush  3 mL Intravenous Q12H   sodium zirconium cyclosilicate   10 g Oral Once   acetaminophen , hydrALAZINE , HYDROmorphone  (DILAUDID ) injection, ondansetron  (ZOFRAN ) IV, sodium chloride  flush, zolpidem   Assessment/ Plan:  Casey French is a 73 y.o.  male with past medical history of coronary artery disease, hypertension, hyperlipidemia, diabetes mellitus type 2, gout, GERD, ESRD on PD, anemia chronic kidney disease, secondary hyperparathyroidism who presented with chest pain and has been admitted for CHF (congestive heart failure) (  HCC) [I50.9] Hypoxia [R09.02]  [facility] [place]  End stage renal disease on Peritoneal dialysis. Received dialysis overnight, UF -674. Will continue night treatments with 2.5% dialysate. Discussed with patient his options of dialysis and he wants to continue PD, does not wish to transition to HD at this time. Will continue to monitor outpatient.   2. Acute respiratory failure, Pulmonary edema noted on CT  angio chest. Missed multiple dialysis treatments. Respiratory status has improved. Continue IV Furosemide  60mg  twice daily.   3. Hyperkalemia, potassium 6.0, due to lack of dialysis. Potassium corrected with dialysis and binders.   4. Anemia of chronic kidney disease Lab Results  Component Value Date   HGB 7.7 (L) 01/24/2024    Hgb decreased, will order Retacrit  20000 units weekly    LOS: 1 Jameika Kinn 12/30/20254:23 PM   "

## 2024-01-24 NOTE — Progress Notes (Signed)
 "  PROGRESS NOTE    Casey French  FMW:[id number]  DOB: [birth year]  DOA: 01/23/2024 PCP: Valora Lynwood FALCON, MD Outpatient Specialists:   Hospital course: 73 year old old male with ESRD on PD MWF, PAF on Eliquis , HTN/chronic HFpEF, HLD, CAD, IIDM, presented with worsening of chest pain and shortness of breath after cutting down on his home PD because he was feeling unwell.  Workup in the ED revealed pulmonary edema on chest x-ray and patient was treated with Lasix  and placed on BiPAP.  Patient was treated with hemodialysis with improvement however developed A-fib with RVR.  Elevation in troponin was thought to be type II NSTEMI in setting of increased filling pressures, pulmonary congestion having missed dialysis.  Echocardiogram without any new wall motion abnormalities.  Patient spontaneously converted back to normal sinus rhythm.   Subjective: Patient states that he feels short of breath.  Had felt better until his heart rate started going fast.  He did feel better after dialysis but then became short of breath again with his heart rate going so fast.   Objective: Vitals:   01/24/24 1050 01/24/24 1052 01/24/24 1508 01/24/24 1635  BP: (!) 160/84 (!) 160/84 126/74 (!) 145/86  Pulse: 77  89 83  Resp: 18  20 20   Temp: 97.6 F (36.4 C)  98 F (36.7 C) 98.1 F (36.7 C)  TempSrc: Oral  Oral Oral  SpO2: 100%  100% 97%  Weight:        Intake/Output Summary (Last 24 hours) at 01/24/2024 1816 Last data filed at 01/24/2024 1100 Gross per 24 hour  Intake -674 ml  Output 1025 ml  Net -1699 ml   Filed Weights   01/23/24 0518 01/23/24 1900  Weight: 59.1 kg 60.1 kg     Exam:  General: Patient sitting on side of bed with tachypnea, minimally labored Eyes: sclera anicteric, conjuctiva mild injection bilaterally CVS: S1-S2, regular  Respiratory: Bilateral rales at bases  GI: NABS, soft, NT  LE: Warm and well-perfused Neuro: A/O x 3 Psych: patient is logical and coherent,  judgement and insight appear normal, mood and affect appropriate to situation.  Data Reviewed:  Basic Metabolic Panel: Recent Labs  Lab 01/23/24 0527 01/23/24 1232 01/24/24 0453  NA 142 142 140  K 6.0* 5.9* 4.8  CL 112* 112* 108  CO2 13* 13* 19*  GLUCOSE 82 83 118*  BUN 90* 93* 77*  CREATININE 9.79* 9.94* 8.58*  CALCIUM  7.6* 7.5* 7.8*    CBC: Recent Labs  Lab 01/23/24 0527 01/24/24 0453  WBC 9.5 7.0  NEUTROABS 7.7  --   HGB 8.7* 7.7*  HCT 28.2* 24.5*  MCV 97.9 96.5  PLT 239 202     Scheduled Meds:  amLODipine   10 mg Oral Daily   calcitRIOL   0.25 mcg Oral Daily   calcium  carbonate  2 tablet Oral TID WC   epoetin  alfa-epbx (RETACRIT ) injection  20,000 Units Subcutaneous Weekly   furosemide   60 mg Intravenous Q12H   gentamicin  cream  1 Application Topical Daily   [START ON 01/25/2024] isosorbide  mononitrate  120 mg Oral Daily   metoprolol  succinate  100 mg Oral Q breakfast   metoprolol  tartrate  5 mg Intravenous NOW   pantoprazole   40 mg Oral Daily   sodium bicarbonate   1,300 mg Oral BID   sodium chloride  flush  3 mL Intravenous Q12H   sodium zirconium cyclosilicate   10 g Oral Once   Continuous Infusions:  dialysis solution 2.5% low-MG/low-CA  heparin  900 Units/hr (01/24/24 1502)     Assessment & Plan:   SOB secondary to pulmonary edema secondary to missed PD ESRD on PD, missed PD Patient considering stopping PD, declines transition to HD. Secondary to missed peritoneal dialysis Shortness of breath improved with dialysis overnight Patient seen by palliative care who notes that he is willing to continue PD because he does not want to die.  No interested in starting HD at present.  PAF with RVR  Chest pain NSTEMI, troponin 271 today Patient seen by cardiology, NSTEMI thought to be type II secondary to increased filling pressures from having missed dialysis/pulmonary edema. Patient is on heparin  drip, Toprol -XL and Imdur  Of note, patient had troponin  172-245 last month, will repeat tomorrow Echocardiogram showed no new wall motion abnormalities Patient spontaneously converted back into sinus rhythm Patient is to be discharged home on his usual metoprolol , increased Imdur  and Eliquis    DVT prophylaxis: Heparin  drip Code Status: DNR Family Communication: None today     Studies: CT Angio Chest PE W and/or Wo Contrast Result Date: 01/23/2024 EXAM: CTA CHEST 01/23/2024 07:59:59 AM TECHNIQUE: CTA of the chest was performed without and with the administration of 75 mL of intravenous iohexol  (OMNIPAQUE ) 350 MG/ML injection. Multiplanar reformatted images are provided for review. MIP images are provided for review. Automated exposure control, iterative reconstruction, and/or weight based adjustment of the mA/kV was utilized to reduce the radiation dose to as low as reasonably achievable. COMPARISON: 04/22/2023 aortic atherosclerotic calcification. Coronary artery calcifications. CLINICAL HISTORY: hypoxia FINDINGS: PULMONARY ARTERIES: Pulmonary arteries are adequately opacified for evaluation. No acute pulmonary embolus. Main pulmonary artery is normal in caliber. MEDIASTINUM: Coronary artery calcifications. The pericardium demonstrates no acute abnormality. Aortic atherosclerotic calcification. There is no acute abnormality of the thoracic aorta. LYMPH NODES: Right hilar lymph node has increased in size, measuring 1.3 cm. No mediastinal or axillary lymphadenopathy. LUNGS AND PLEURA: Diffuse interlobular septal thickening is identified along with ground glass attenuation, most likely representing pulmonary edema. Band-like area of consolidative change within the posterior and anterior basal right lower lobe compatible with atelectasis and/or pneumonia. Consolidative change within the posterior left base. Emphysema. Small right pleural effusion. No pneumothorax. UPPER ABDOMEN: Perihepatic and perisplenic ascites noted. SOFT TISSUES AND BONES: No acute or  suspicious osseous findings. No acute soft tissue abnormality. IMPRESSION: 1. Diffuse interlobular septal thickening and ground glass attenuation, most likely representing pulmonary edema. 2. Small right pleural effusion. 3. Band-like area of consolidative change within the posterior and anterior basal right lower lobe compatible with atelectasis and/or pneumonia. 4. Consolidative change within the posterior left base, pneumonia versus aspiration vs asymmetric edema. Electronically signed by: Waddell Calk MD 01/23/2024 08:16 AM EST RP Workstation: [id number]   DG Chest 1 View Result Date: 01/23/2024 EXAM: 1 VIEW(S) XRAY OF THE CHEST 01/23/2024 05:35:00 AM COMPARISON: 12/21/2023 CLINICAL HISTORY: 73 year old male with chest pain. FINDINGS: LUNGS AND PLEURA: Pulmonary interstitial prominence, no overt pulmonary edema. Hazy opacity at right lung base, possible airspace consolidation or atelectasis. Small right pleural effusion. No pneumothorax. Mildly lower lung volumes. HEART AND MEDIASTINUM: Cardiomegaly. Calcified aorta. BONES AND SOFT TISSUES: No acute osseous abnormality. Negative visible bowel gas. IMPRESSION: 1. Lower lung volumes with Right basilar opacity, atelectasis vs infection, probable small effusion. Electronically signed by: Helayne Hurst MD 01/23/2024 05:48 AM EST RP Workstation: [id number]    Principal Problem:   CHF (congestive heart failure) (HCC) Active Problems:   Acute pulmonary edema (HCC)   Passive suicidal ideations  Ivan Vangie Pike, [facility]  If 7PM-7AM, please contact night-coverage [email or web address]   LOS: 1 day  "

## 2024-01-24 NOTE — Progress Notes (Signed)
"  °  Peritoneal Dialysis Treatment Disconnection Note     Post Treatment Weight: 60 Kg   Consent signed and in chart.  PD treatment disconnected via aseptic technique.    Patient is awake and alert. No complaints of pain.    PD exit site clean, dry and intact.    Hand-off given to the patient's nurse.    Ozell Jubilee RN [facility] "

## 2024-01-24 NOTE — Progress Notes (Signed)
 PHARMACY - ANTICOAGULATION CONSULT NOTE  Pharmacy Consult for Heparin  Infusion Indication: chest pain/ACS  Allergies[1]  Patient Measurements: Weight: 60.1 kg (132 lb 7.9 oz)  Vital Signs: Temp: 98 F (36.7 C) (12/30 0330) Temp Source: Axillary (12/30 0330) BP: 164/82 (12/30 0630) Pulse Rate: 79 (12/30 0630)  Labs: Recent Labs    01/23/24 0527 01/23/24 1232 01/23/24 1914 01/24/24 0453  HGB 8.7*  --   --  7.7*  HCT 28.2*  --   --  24.5*  PLT 239  --   --  202  APTT  --  41*  --   --   LABPROT  --  16.2*  --   --   INR  --  1.2  --   --   HEPARINUNFRC  --  0.13* 0.29* 0.30  CREATININE 9.79* 9.94*  --  8.58*    Estimated Creatinine Clearance: 6.6 mL/min (A) (by C-G formula based on SCr of 8.58 mg/dL (H)).   Medical History: Past Medical History:  Diagnosis Date   Acute diverticulitis 06/14/2014   Acute ST elevation myocardial infarction (STEMI) of inferior wall (HCC) 2003   a.) PCI with Cypher stent to the RCA   Anemia of chronic renal failure    Aortic atherosclerosis    Bladder tumor    CAP (community acquired pneumonia) 06/2022   Cerebral microvascular disease    Coronary artery disease involving native coronary artery of native heart without angina pectoris 2003   a.) PCI with Cypher stent to the RCA 2003; b.) LHC 08/2003: 100% dRCA, 60% pLAD - med mgmt; c.) LHC/PCI 08/21/2015: occluded RCA at site of prior stent placement, 70% mLAD (FFR 0.79; 3.5 x 12 mm Xience DES), 50% dLAD (FFR 0.70); d.) MV 11/11/2016: no ischemia   Depression    Diverticulosis    End-stage renal disease on peritoneal dialysis (HCC)    GERD (gastroesophageal reflux disease)    GI bleed 05/15/2019   Gout    Heart murmur    Hematemesis 05/15/2019   Hepatic cyst    Hepatomegaly    Hyperlipidemia    Hypertension    Lacunar infarction (HCC) 02/11/2007   a.) CT head 02/11/2007: lacunar infarction involving thalamus and external capsule region on the RIGHT; b.) MRI brain 02/03/2020:  multiple remote lacunar infarcts involving the RIGHT basal ganglia, RIGHT thalamus, and LEFT middle cerebellar peduncle   Long-term use of aspirin  therapy    Myocardial bridge    On apixaban  therapy    Pancolitis 09/2022   Paroxysmal atrial fibrillation (HCC)    a.) CHA2DS2VASc = 6 (age, HTN, CVA x 2, prior MI/vascular disease, T2DM) as of 03/11/2023; b.) rate/rhythm maintained on oral metoprolol  succinate; chronically anticoagulated with dose reduced apixaban    Renal cyst, right    Sigmoid diverticulitis 03/2022   T2DM (type 2 diabetes mellitus) (HCC)    Traumatic skull fracture and resulting subarachnoid hemorrhage 03/24/2013   a.) MOI was slipping and falling on ice   Unstable angina (HCC)     Medications:  Per chart review, it appears patient takes apixaban  2.5 mg daily- med rec pending  Assessment: Patient is a 73 year old male with a past medical history of ESRD on PD MWF, PAF on Eliquis , HTN/chronic HFpEF, HLD, CAD, IIDM, presented with worsening of chest pain and shortness of breath. Tropon trending [id number]. Per provider H&P: Pattern is flat implying demand ischemia secondary to CHF decompensation.  However there was a recent stress test done 2 weeks ago which showed small reversible inferior  wall defect of mild intensity consistent with mild ischemia.  Cardiology reviewed patient past stress test which showed similar inferior reversible ischemia as a stress test in 2023.  Cardiology recommended 48 hours of heparin  drip. Pharmacy has been consulted to start patient on a heparin  infusion.  Baseline INR 1.2, aPTT 41, and heparin  level 0.13. No signs/symptoms of bleeding noted in chart.  Goal of Therapy:  Heparin  level 0.3-0.7 units/ml aPTT 66-102 seconds Monitor platelets by anticoagulation protocol: Yes   Plan:  12/30:  HL @ 0453 = 0.30, therapeutic X 1  - will continue pt on current rate and recheck HL in 8 hrs - Monitor CBC daily while on heparin   Brandice Busser D,  PharmD 01/24/2024,6:45 AM        [1]  Allergies Allergen Reactions   Morphine  Anxiety    agitation

## 2024-01-24 NOTE — Progress Notes (Signed)
 PHARMACY - ANTICOAGULATION CONSULT NOTE  Pharmacy Consult for Heparin  Infusion Indication: chest pain/ACS  Allergies[1]  Patient Measurements: Weight: 60.1 kg (132 lb 7.9 oz)  Vital Signs: Temp: 97.6 F (36.4 C) (12/30 1050) Temp Source: Oral (12/30 1050) BP: 160/84 (12/30 1052) Pulse Rate: 77 (12/30 1050)  Labs: Recent Labs    01/23/24 0527 01/23/24 1232 01/23/24 1232 01/23/24 1914 01/24/24 0453 01/24/24 1352  HGB 8.7*  --   --   --  7.7*  --   HCT 28.2*  --   --   --  24.5*  --   PLT 239  --   --   --  202  --   APTT  --  41*  --   --   --   --   LABPROT  --  16.2*  --   --   --   --   INR  --  1.2  --   --   --   --   HEPARINUNFRC  --  0.13*   < > 0.29* 0.30 0.26*  CREATININE 9.79* 9.94*  --   --  8.58*  --    < > = values in this interval not displayed.    Estimated Creatinine Clearance: 6.6 mL/min (A) (by C-G formula based on SCr of 8.58 mg/dL (H)).   Medical History: Past Medical History:  Diagnosis Date   Acute diverticulitis 06/14/2014   Acute ST elevation myocardial infarction (STEMI) of inferior wall (HCC) 2003   a.) PCI with Cypher stent to the RCA   Anemia of chronic renal failure    Aortic atherosclerosis    Bladder tumor    CAP (community acquired pneumonia) 06/2022   Cerebral microvascular disease    Coronary artery disease involving native coronary artery of native heart without angina pectoris 2003   a.) PCI with Cypher stent to the RCA 2003; b.) LHC 08/2003: 100% dRCA, 60% pLAD - med mgmt; c.) LHC/PCI 08/21/2015: occluded RCA at site of prior stent placement, 70% mLAD (FFR 0.79; 3.5 x 12 mm Xience DES), 50% dLAD (FFR 0.70); d.) MV 11/11/2016: no ischemia   Depression    Diverticulosis    End-stage renal disease on peritoneal dialysis (HCC)    GERD (gastroesophageal reflux disease)    GI bleed 05/15/2019   Gout    Heart murmur    Hematemesis 05/15/2019   Hepatic cyst    Hepatomegaly    Hyperlipidemia    Hypertension    Lacunar  infarction (HCC) 02/11/2007   a.) CT head 02/11/2007: lacunar infarction involving thalamus and external capsule region on the RIGHT; b.) MRI brain 02/03/2020: multiple remote lacunar infarcts involving the RIGHT basal ganglia, RIGHT thalamus, and LEFT middle cerebellar peduncle   Long-term use of aspirin  therapy    Myocardial bridge    On apixaban  therapy    Pancolitis 09/2022   Paroxysmal atrial fibrillation (HCC)    a.) CHA2DS2VASc = 6 (age, HTN, CVA x 2, prior MI/vascular disease, T2DM) as of 03/11/2023; b.) rate/rhythm maintained on oral metoprolol  succinate; chronically anticoagulated with dose reduced apixaban    Renal cyst, right    Sigmoid diverticulitis 03/2022   T2DM (type 2 diabetes mellitus) (HCC)    Traumatic skull fracture and resulting subarachnoid hemorrhage 03/24/2013   a.) MOI was slipping and falling on ice   Unstable angina (HCC)     Medications:  Per chart review, it appears patient takes apixaban  2.5 mg daily- med rec pending  Assessment: Patient is a 73 year old  male with a past medical history of ESRD on PD MWF, PAF on Eliquis , HTN/chronic HFpEF, HLD, CAD, IIDM, presented with worsening of chest pain and shortness of breath. Tropon trending [id number]. Per provider H&P: Pattern is flat implying demand ischemia secondary to CHF decompensation.  However there was a recent stress test done 2 weeks ago which showed small reversible inferior wall defect of mild intensity consistent with mild ischemia.  Cardiology reviewed patient past stress test which showed similar inferior reversible ischemia as a stress test in 2023.  Cardiology recommended 48 hours of heparin  drip. Pharmacy has been consulted to start patient on a heparin  infusion.  Baseline INR 1.2, aPTT 41, and heparin  level 0.13. No signs/symptoms of bleeding noted in chart.  12/30:  HL @ 0453 = 0.30, therapeutic X 1  12/30: HL @ 1352 = 0.26, subtherapeutic   Goal of Therapy:  Heparin  level 0.3-0.7 units/ml aPTT  66-102 seconds Monitor platelets by anticoagulation protocol: Yes   Plan:  Give 900 unit bolus x 1 Increase heparin  infusion rate to 900 units/hr Check heparin  level in 8 hours Monitor CBC daily while on heparin   Lum VEAR Mania, PharmD 01/24/2024,2:36 PM         [1]  Allergies Allergen Reactions   Morphine  Anxiety    agitation

## 2024-01-24 NOTE — Consult Note (Signed)
 "                                                  Palliative Care Consult Note                                  Date: 01/24/2024   Patient Name: Casey French  DOB: [birth year]  MRN: [id number]  Age / Sex: 73 y.o., male  PCP: Valora Lynwood FALCON, MD Referring Physician: Dana Ivan Masse*  Reason for Consultation: Establishing goals of care  Past Medical History:  Diagnosis Date   Acute diverticulitis 06/14/2014   Acute ST elevation myocardial infarction (STEMI) of inferior wall (HCC) 2003   a.) PCI with Cypher stent to the RCA   Anemia of chronic renal failure    Aortic atherosclerosis    Bladder tumor    CAP (community acquired pneumonia) 06/2022   Cerebral microvascular disease    Coronary artery disease involving native coronary artery of native heart without angina pectoris 2003   a.) PCI with Cypher stent to the RCA 2003; b.) LHC 08/2003: 100% dRCA, 60% pLAD - med mgmt; c.) LHC/PCI 08/21/2015: occluded RCA at site of prior stent placement, 70% mLAD (FFR 0.79; 3.5 x 12 mm Xience DES), 50% dLAD (FFR 0.70); d.) MV 11/11/2016: no ischemia   Depression    Diverticulosis    End-stage renal disease on peritoneal dialysis (HCC)    GERD (gastroesophageal reflux disease)    GI bleed 05/15/2019   Gout    Heart murmur    Hematemesis 05/15/2019   Hepatic cyst    Hepatomegaly    Hyperlipidemia    Hypertension    Lacunar infarction (HCC) 02/11/2007   a.) CT head 02/11/2007: lacunar infarction involving thalamus and external capsule region on the RIGHT; b.) MRI brain 02/03/2020: multiple remote lacunar infarcts involving the RIGHT basal ganglia, RIGHT thalamus, and LEFT middle cerebellar peduncle   Long-term use of aspirin  therapy    Myocardial bridge    On apixaban  therapy    Pancolitis 09/2022   Paroxysmal atrial fibrillation (HCC)    a.) CHA2DS2VASc = 6 (age, HTN, CVA x 2, prior MI/vascular disease, T2DM) as of 03/11/2023; b.) rate/rhythm maintained on oral metoprolol   succinate; chronically anticoagulated with dose reduced apixaban    Renal cyst, right    Sigmoid diverticulitis 03/2022   T2DM (type 2 diabetes mellitus) (HCC)    Traumatic skull fracture and resulting subarachnoid hemorrhage 03/24/2013   a.) MOI was slipping and falling on ice   Unstable angina (HCC)     Subjective:   This NP Camellia Kays reviewed medical records, received report from team, assessed the patient and then meet at the patient's bedside to discuss diagnosis, prognosis, GOC, EOL wishes disposition and options.  Before meeting with the patient/family, I spent time reviewing the chart notes including admission H&P from yesterday, ED nursing notes from yesterday, nephrology note from yesterday, RT note from today, nursing note from today, PT and OT notes from today.  Later in the day also reviewed behavioral health note from today. I also reviewed vital signs, nursing flowsheets, medication administrations record, labs, and imaging. Labs reviewed include BMP which shows improvement in creatinine from 9.94 yesterday to 8.58 today, improvement in BNP from 93 yesterday to 77 today after  round of peritoneal dialysis overnight.  I met with the patient at bedside, no family was present.   We meet to discuss diagnosis prognosis, GOC, EOL wishes, disposition and options. Concept of Palliative Care was introduced as specialized medical care for people and their families living with serious illness.  If focuses on providing relief from the symptoms and stress of a serious illness.  The goal is to improve quality of life for both the patient and the family. Values and goals of care important to patient and family were attempted to be elicited.  Created space and opportunity for patient  and family to explore thoughts and feelings regarding current medical situation   Natural trajectory and current clinical status were discussed. Questions and concerns addressed. Patient  encouraged to call with  questions or concerns.    Patient/Family Understanding of Illness: The patient understands he has kidney failure, started peritoneal dialysis 3 years ago and has to do it nightly.  He also has heart troubles and has had to have 2 stents previously placed.  He notes he had a stress test 2 weeks ago with no changes.  We spent a substantial amount of time talking about his acute on chronic HFpEF, ESRD on PD and the interplay between the heart and the kidneys, subsequent volume overload likely from missing peritoneal dialysis for 2 episodes.  Life Review: The patient has been married for 50 years and has 2 daughters: Andrea and [address]  His daughter Nat is technically his granddaughter but they raised her so they consider her other daughter.  He worked previously [employer] in [place] and [employer].  His wife works there as a diplomatic services operational officer.  He retired sometime around 2007 or 2008.  His key tip to marriage that he would pass onto younger generations is your wife is always right.  Previously he enjoyed doing yard work, especially mowing the grass but he has been unable to do this due to back pain and his overall health.  He also enjoys tinkering on his old truck, puttering around.  He is faithful and practices in the Christian tradition.  Baseline Status: Patient currently lives at home with his wife and his daughter Andrea who moved them to help.  He is independent of IADLs prior to admission.  Today's Discussion: In addition to discussions described above he had extensive discussion on various topics.  We spent a lot of time talking about kidney failure and dialysis.  We discussed the frustrations and symptoms that are associated with dialysis.  We talked about dialysis as truly life support, although not as 24/7 invasive as a ventilator.  The patient felt safe and venting his frustrations with dialysis and having to do it every night.  He states that he is tired of doing dialysis, but understands  that if he stops then he would pass away shortly.  We spent time talking about his comments to previous providers that not wanting to do dialysis anymore.  We talked about telling his family he wants to die, likely an indication of his frustration with his dialysis.  We talked about telling other providers that he does want to continue dialysis.  We spent a good amount of time having open and honest conversation.  At the end of our conversation, the patient is clear that while he does not like doing dialysis and does not want to continue it, he will continue it in order to continue living.  I allowed him space and opportunity to express his  feelings about dialysis and encouraged him to continue conversations with his nephrology team.  I shared that it is not uncommon that patients on dialysis at some point during her journey decide to discontinue dialysis and that is okay, as long as he understands what that means for him.  He states that he would feel comfortable talking about this with his nephrologist, if and when that time comes, and I encouraged him to do so.  He does have some hope and that nephrology may be able to offer him a 3 times a week option.  I encouraged him to continue these conversations.  I shared that palliative medicine would follow-up in a couple days if he remains admitted for ongoing conversations.  While he is in the hospital the medical team will treat his acute illness with a goal of discharging when medically stable.  I provided emotional and general support through therapeutic listening, empathy, sharing of stories, and other techniques. I answered all questions and addressed all concerns to the best of my ability.  Goals: DNR-Limited, continue full scope of care otherwise including open to ongoing peritoneal dialysis.  Encouraged ongoing goals of care conversations with his nephrology team if/when he decides to set limits to his care.  Review of Systems  Constitutional:         Denies pain in general  Respiratory:  Positive for shortness of breath (Significantly improved).   Gastrointestinal:  Negative for abdominal pain, nausea and vomiting.    Objective:   Primary Diagnoses: Present on Admission:  Acute pulmonary edema (HCC)   Vital Signs:  BP (!) 124/93   Pulse (!) 122   Temp 98 F (36.7 C) (Axillary)   Resp (!) 23   Wt 60.1 kg   SpO2 100%   BMI 19.57 kg/m   Physical Exam Vitals and nursing note reviewed.  Constitutional:      General: He is not in acute distress.    Appearance: He is ill-appearing.     Comments: Sitting edge of bed  HENT:     Head: Normocephalic and atraumatic.  Cardiovascular:     Rate and Rhythm: Normal rate.  Pulmonary:     Effort: Pulmonary effort is normal. No respiratory distress.  Abdominal:     General: Abdomen is flat. Bowel sounds are normal. There is no distension.     Palpations: Abdomen is soft.  Skin:    General: Skin is warm and dry.  Neurological:     General: No focal deficit present.     Mental Status: He is alert and oriented to person, place, and time.  Psychiatric:        Mood and Affect: Mood normal.        Behavior: Behavior normal.     Palliative Assessment/Data: 70-80%   Assessment & Plan:   HPI/Patient Profile: 73 y.o. male  with past medical history of ESRD on PD MWF, PAF on Eliquis , HTN/chronic HFpEF, HLD, CAD, IIDM, presented with worsening of chest pain and shortness of breath.  He was admitted on 01/23/2024 with hyperkalemia, acute on chronic HFpEF decompensation, acute hypoxic respiratory failure, anginal-like chest pain, ESRD on PD, and others.   Palliative medicine was consulted for GOC conversations given patient expressing wish to stop peritoneal dialysis.  SUMMARY OF RECOMMENDATIONS   DNR-Limited Continue full scope of care otherwise Patient remains open to continuing peritoneal dialysis Hopeful for other options on less frequent treatment Encouraged ongoing  conversations about limit-setting with nephrology when desired Palliative medicine will  follow-up in a couple days for ongoing conversations  Symptom Management:  Per primary team Palliative medicine is available to assist as needed  Code Status: DNR - Limited (DNR/DNI)  Prognosis:  Unable to determine  Discharge Planning:  To Be Determined   Discussed with: Patient, medical team, nursing team    Thank you for allowing us  to participate in the care of Nancyann JONELLE Phlegm PMT will continue to support holistically.  Time Total: 60 min  Detailed review of medical records (labs, imaging, vital signs), medically appropriate exam, discussed with treatment team, counseling and education to patient, family, & staff, documenting clinical information, medication management, coordination of care  Signed by: Camellia Kays, NP Palliative Medicine Team  Team Phone # [phone] (Nights/Weekends)  01/24/2024, 10:12 AM  "

## 2024-01-24 NOTE — Progress Notes (Signed)
 Found pt to have mask off and in his hands. Pt speaking in complete sentences, SPO2 98% on RA. Placed pt on 3L [place], Bipap on SBY, call bell in reach and RN(Cathy) made aware of pt status.

## 2024-01-24 NOTE — Consult Note (Signed)
 " [facility] CLINIC CARDIOLOGY CONSULT NOTE       Patient ID: Casey French MRN: [id number] DOB/AGE: 27-Jul-1950 73 y.o.  Admit date: 01/23/2024 Referring Physician Dr. Dana Primary Physician Casey Lynwood FALCON, MD  Primary Cardiologist Casey Pierre, PA Reason for Consultation AoCHF, elevated troponin  HPI: TODDRICK French is a 73 y.o. male  with a past medical history of coronary artery disease s/p multiple stents most recently 2017 @ [facility], paroxysmal atrial fibrillation, hypertension, hyperlipidemia, type II diabetes, end-stage renal disease on peritoneal dialysis  who presented to the ED on 01/23/2024 for SOB and CP. Cardiology was consulted for further evaluation.   Patient came to ED yesterday for evaluation of SOB and CP, skipped PD Friday and Sunday. Workup in the ED notable for creatinine 9.79, potassium 6.0, hemoglobin 8.7, WBC 9.5. Troponins 186 > 215 > 271, BNP not checked. EKG in the ED NSR rate 92 bpm, no acute ischemic changes. CTA chest in the ED yesterday consistent with pulmonary edema, as well as possible pneumonia.   At the time of my evaluation this afternoon, patient is resting comfortably in hospital bed.  We discussed his symptoms in further detail.  He endorses chest tightness and associated shortness of breath and orthopnea which started on Sunday into Monday after he skipped his peritoneal dialysis on Friday and again on Sunday.  After receiving PD last night he reports that he is feeling better overall today.  Denies any recurrence of chest discomfort.  He is back in normal sinus rhythm this afternoon after episode of A-fib RVR earlier today.  Review of systems complete and found to be negative unless listed above    Past Medical History:  Diagnosis Date   Acute diverticulitis 06/14/2014   Acute ST elevation myocardial infarction (STEMI) of inferior wall (HCC) 2003   a.) PCI with Cypher stent to the RCA   Anemia of chronic renal failure    Aortic  atherosclerosis    Bladder tumor    CAP (community acquired pneumonia) 06/2022   Cerebral microvascular disease    Coronary artery disease involving native coronary artery of native heart without angina pectoris 2003   a.) PCI with Cypher stent to the RCA 2003; b.) LHC 08/2003: 100% dRCA, 60% pLAD - med mgmt; c.) LHC/PCI 08/21/2015: occluded RCA at site of prior stent placement, 70% mLAD (FFR 0.79; 3.5 x 12 mm Xience DES), 50% dLAD (FFR 0.70); d.) MV 11/11/2016: no ischemia   Depression    Diverticulosis    End-stage renal disease on peritoneal dialysis (HCC)    GERD (gastroesophageal reflux disease)    GI bleed 05/15/2019   Gout    Heart murmur    Hematemesis 05/15/2019   Hepatic cyst    Hepatomegaly    Hyperlipidemia    Hypertension    Lacunar infarction (HCC) 02/11/2007   a.) CT head 02/11/2007: lacunar infarction involving thalamus and external capsule region on the RIGHT; b.) MRI brain 02/03/2020: multiple remote lacunar infarcts involving the RIGHT basal ganglia, RIGHT thalamus, and LEFT middle cerebellar peduncle   Long-term use of aspirin  therapy    Myocardial bridge    On apixaban  therapy    Pancolitis 09/2022   Paroxysmal atrial fibrillation (HCC)    a.) CHA2DS2VASc = 6 (age, HTN, CVA x 2, prior MI/vascular disease, T2DM) as of 03/11/2023; b.) rate/rhythm maintained on oral metoprolol  succinate; chronically anticoagulated with dose reduced apixaban    Renal cyst, right    Sigmoid diverticulitis 03/2022   T2DM (type 2  diabetes mellitus) (HCC)    Traumatic skull fracture and resulting subarachnoid hemorrhage 03/24/2013   a.) MOI was slipping and falling on ice   Unstable angina [facility])     Past Surgical History:  Procedure Laterality Date   BLADDER INSTILLATION N/A 04/05/2023   Procedure: BLADDER INSTILLATION OF GEMCITABINE ;  Surgeon: Twylla Glendia BROCKS, MD;  Location: [facility] ORS;  Service: Urology;  Laterality: N/A;   CAPD INSERTION N/A 08/13/2020   Procedure: LAPAROSCOPIC  INSERTION CONTINUOUS AMBULATORY PERITONEAL DIALYSIS  (CAPD) CATHETER;  Surgeon: Lane Shope, MD;  Location: [facility] ORS;  Service: General;  Laterality: N/A;   CARDIAC CATHETERIZATION     CATARACT EXTRACTION W/PHACO Right 07/05/2019   Procedure: CATARACT EXTRACTION PHACO AND INTRAOCULAR LENS PLACEMENT (IOC) RIGHT DIABETIC;  Surgeon: Jaye Fallow, MD;  Location: [facility] ORS;  Service: Ophthalmology;  Laterality: Right;  US00:59.3 CDE8.41 LOT [id number] h   COLONOSCOPY WITH PROPOFOL  N/A 04/01/2017   Procedure: COLONOSCOPY WITH PROPOFOL ;  Surgeon: Gaylyn Gladis PENNER, MD;  Location: [facility] ENDOSCOPY;  Service: Endoscopy;  Laterality: N/A;   CORONARY ANGIOPLASTY WITH STENT PLACEMENT  10/2001   RCA   CORONARY ANGIOPLASTY WITH STENT PLACEMENT Left 08/21/2015   Procedure: CORONARY ANGIOPLASTY WITH STENT PLACEMENT (mid LAD); Location: [facility]; Surgeon: Zachary Car, MD   ESOPHAGOGASTRODUODENOSCOPY N/A 04/24/2023   Procedure: EGD (ESOPHAGOGASTRODUODENOSCOPY);  Surgeon: Toledo, Ladell POUR, MD;  Location: [facility] ENDOSCOPY;  Service: Gastroenterology;  Laterality: N/A;   HAND SURGERY  2015   3 screws in right hand   TOTAL HIP ARTHROPLASTY Left 04/22/2020   Procedure: TOTAL HIP ARTHROPLASTY ANTERIOR APPROACH;  Surgeon: Kathlynn Sharper, MD;  Location: [facility] ORS;  Service: Orthopedics;  Laterality: Left;   TRANSURETHRAL RESECTION OF BLADDER TUMOR N/A 04/05/2023   Procedure: TURBT (TRANSURETHRAL RESECTION OF BLADDER TUMOR);  Surgeon: Twylla Glendia BROCKS, MD;  Location: [facility] ORS;  Service: Urology;  Laterality: N/A;    (Not in a hospital admission)  Social History   Socioeconomic History   Marital status: Married    Spouse name: Casey French   Number of children: 2   Years of education: Not on file   Highest education level: Not on file  Occupational History   Not on file  Tobacco Use   Smoking status: Former    Current packs/day: 0.00    Types: Cigarettes    Quit date: 03/24/2001    Years since quitting: 22.8    Smokeless tobacco: Never  Vaping Use   Vaping status: Never Used  Substance and Sexual Activity   Alcohol use: No    Alcohol/week: 0.0 standard drinks of alcohol   Drug use: No   Sexual activity: Not on file  Other Topics Concern   Not on file  Social History Narrative   Lives with wife   Social Drivers of Health   Tobacco Use: Medium Risk (01/12/2024)   Received [facility]   Patient History    Smoking Tobacco Use: Former    Smokeless Tobacco Use: Never    Passive Exposure: Not on file  Financial Resource Strain: Medium Risk (11/28/2023)   Received from [facility]   Overall Financial Resource Strain (CARDIA)    Difficulty of Paying Living Expenses: Somewhat hard  Food Insecurity: Food Insecurity Present (12/22/2023)   Epic    Worried About Programme Researcher, Broadcasting/film/video in the Last Year: Sometimes true    Ran Out of Food in the Last Year: Never true  Transportation Needs: No Transportation Needs (12/22/2023)   Epic  Lack of Transportation (Medical): No    Lack of Transportation (Non-Medical): No  Physical Activity: Not on file  Stress: Not on file  Social Connections: Socially Isolated (12/22/2023)   Social Connection and Isolation Panel    Frequency of Communication with Friends and Family: Once a week    Frequency of Social Gatherings with Friends and Family: Never    Attends Religious Services: Never    Database Administrator or Organizations: No    Attends Banker Meetings: Never    Marital Status: Married  Catering Manager Violence: Not At Risk (12/22/2023)   Epic    Fear of Current or Ex-Partner: No    Emotionally Abused: No    Physically Abused: No    Sexually Abused: No  Depression (PHQ2-9): Not on file  Alcohol Screen: Not on file  Housing: Low Risk (12/22/2023)   Epic    Unable to Pay for Housing in the Last Year: No    Number of Times Moved in the Last Year: 0    Homeless in the Last Year: No  Utilities:  Not At Risk (12/22/2023)   Epic    Threatened with loss of utilities: No  Health Literacy: Not on file    Family History  Problem Relation Age of Onset   Heart disease Mother    Heart disease Father    CAD Other      Vitals:   01/24/24 0932 01/24/24 1050 01/24/24 1052 01/24/24 1508  BP:  (!) 160/84 (!) 160/84 126/74  Pulse:  77  89  Resp:  18  20  Temp:  97.6 F (36.4 C)  98 F (36.7 C)  TempSrc:  Oral  Oral  SpO2: 100% 100%  100%  Weight:        PHYSICAL EXAM General: Chronically ill-appearing male, well nourished, in no acute distress. HEENT: Normocephalic and atraumatic. Neck: No JVD.  Lungs: Normal respiratory effort on 3L [place]. Clear bilaterally to auscultation. No wheezes, crackles, rhonchi.  Heart: HRRR. Normal S1 and S2 without gallops or murmurs.  Abdomen: Non-distended appearing.  Msk: Normal strength and tone for age. Extremities: Warm and well perfused. No clubbing, cyanosis.  No edema.  Neuro: Alert and oriented X 3. Psych: Answers questions appropriately.   Labs: Basic Metabolic Panel: Recent Labs    01/23/24 1232 01/24/24 0453  NA 142 140  K 5.9* 4.8  CL 112* 108  CO2 13* 19*  GLUCOSE 83 118*  BUN 93* 77*  CREATININE 9.94* 8.58*  CALCIUM  7.5* 7.8*   Liver Function Tests: No results for input(s): AST, ALT, ALKPHOS, BILITOT, PROT, ALBUMIN in the last 72 hours. No results for input(s): LIPASE, AMYLASE in the last 72 hours. CBC: Recent Labs    01/23/24 0527 01/24/24 0453  WBC 9.5 7.0  NEUTROABS 7.7  --   HGB 8.7* 7.7*  HCT 28.2* 24.5*  MCV 97.9 96.5  PLT 239 202   Cardiac Enzymes: No results for input(s): CKTOTAL, CKMB, CKMBINDEX, TROPONINIHS in the last 72 hours. BNP: No results for input(s): BNP in the last 72 hours. D-Dimer: No results for input(s): DDIMER in the last 72 hours. Hemoglobin A1C: No results for input(s): HGBA1C in the last 72 hours. Fasting Lipid Panel: No results for input(s): CHOL,  HDL, LDLCALC, TRIG, CHOLHDL, LDLDIRECT in the last 72 hours. Thyroid  Function Tests: No results for input(s): TSH, T4TOTAL, T3FREE, THYROIDAB in the last 72 hours.  Invalid input(s): FREET3 Anemia Panel: No results for input(s): VITAMINB12, FOLATE, FERRITIN,  TIBC, IRON , RETICCTPCT in the last 72 hours.   Radiology: CT Angio Chest PE W and/or Wo Contrast Result Date: 01/23/2024 EXAM: CTA CHEST 01/23/2024 07:59:59 AM TECHNIQUE: CTA of the chest was performed without and with the administration of 75 mL of intravenous iohexol  (OMNIPAQUE ) 350 MG/ML injection. Multiplanar reformatted images are provided for review. MIP images are provided for review. Automated exposure control, iterative reconstruction, and/or weight based adjustment of the mA/kV was utilized to reduce the radiation dose to as low as reasonably achievable. COMPARISON: 04/22/2023 aortic atherosclerotic calcification. Coronary artery calcifications. CLINICAL HISTORY: hypoxia FINDINGS: PULMONARY ARTERIES: Pulmonary arteries are adequately opacified for evaluation. No acute pulmonary embolus. Main pulmonary artery is normal in caliber. MEDIASTINUM: Coronary artery calcifications. The pericardium demonstrates no acute abnormality. Aortic atherosclerotic calcification. There is no acute abnormality of the thoracic aorta. LYMPH NODES: Right hilar lymph node has increased in size, measuring 1.3 cm. No mediastinal or axillary lymphadenopathy. LUNGS AND PLEURA: Diffuse interlobular septal thickening is identified along with ground glass attenuation, most likely representing pulmonary edema. Band-like area of consolidative change within the posterior and anterior basal right lower lobe compatible with atelectasis and/or pneumonia. Consolidative change within the posterior left base. Emphysema. Small right pleural effusion. No pneumothorax. UPPER ABDOMEN: Perihepatic and perisplenic ascites noted. SOFT TISSUES AND BONES: No  acute or suspicious osseous findings. No acute soft tissue abnormality. IMPRESSION: 1. Diffuse interlobular septal thickening and ground glass attenuation, most likely representing pulmonary edema. 2. Small right pleural effusion. 3. Band-like area of consolidative change within the posterior and anterior basal right lower lobe compatible with atelectasis and/or pneumonia. 4. Consolidative change within the posterior left base, pneumonia versus aspiration vs asymmetric edema. Electronically signed by: Waddell Calk MD 01/23/2024 08:16 AM EST RP Workstation: [id number]   DG Chest 1 View Result Date: 01/23/2024 EXAM: 1 VIEW(S) XRAY OF THE CHEST 01/23/2024 05:35:00 AM COMPARISON: 12/21/2023 CLINICAL HISTORY: 73 year old male with chest pain. FINDINGS: LUNGS AND PLEURA: Pulmonary interstitial prominence, no overt pulmonary edema. Hazy opacity at right lung base, possible airspace consolidation or atelectasis. Small right pleural effusion. No pneumothorax. Mildly lower lung volumes. HEART AND MEDIASTINUM: Cardiomegaly. Calcified aorta. BONES AND SOFT TISSUES: No acute osseous abnormality. Negative visible bowel gas. IMPRESSION: 1. Lower lung volumes with Right basilar opacity, atelectasis vs infection, probable small effusion. Electronically signed by: Helayne Hurst MD 01/23/2024 05:48 AM EST RP Workstation: [id number]    ECHO 12/22/2023: 1. Left ventricular ejection fraction, by estimation, is 60 to 65%. The left ventricle has normal function. The left ventricle has no regional wall motion abnormalities. Left ventricular diastolic parameters are consistent with Grade II diastolic dysfunction (pseudonormalization).   2. Right ventricular systolic function is normal. The right ventricular  size is normal.   3. The mitral valve is normal in structure. Mild mitral valve  regurgitation. No evidence of mitral stenosis.   4. The aortic valve is normal in structure. Aortic valve regurgitation is  not visualized.  Aortic valve sclerosis/calcification is present, without  any evidence of aortic stenosis.   5. The inferior vena cava is normal in size with greater than 50%  respiratory variability, suggesting right atrial pressure of 3 mmHg.   Nuclear stress test 01/05/2024: FINDINGS:  Regional wall motion:  reveals normal myocardial thickening and wall  motion.  The overall quality of the study is good.   Artifacts noted: no  Left ventricular cavity: normal.   Perfusion Analysis:  SPECT images demonstrate small perfusion abnormality  of mild intensity is present in the  inferior region on the stress images.  Defect type : Reversible     TELEMETRY (personally reviewed): Sinus rhythm rate 70s  EKG (personally reviewed): AF RVR rate 138 bpm   Data reviewed by me 01/24/2024: last 24h vitals tele labs imaging I/O ED provider note, admission H&P  Principal Problem:   CHF (congestive heart failure) (HCC) Active Problems:   Acute pulmonary edema (HCC)    ASSESSMENT AND PLAN:  SAMEUL TAGLE is a 73 y.o. male  with a past medical history of coronary artery disease s/p multiple stents most recently 2017 @ [facility], paroxysmal atrial fibrillation, hypertension, hyperlipidemia, type II diabetes, end-stage renal disease on peritoneal dialysis  who presented to the ED on 01/23/2024 for SOB and CP. Cardiology was consulted for further evaluation.   # Acute on chronic HFpEF  # Atrial fibrillation RVR # Paroxysmal atrial fibrillation # Coronary artery disease # Demand ischemia Patient presented for evaluation of SOB, CP after skipping two PD treatments. Started on BiPAP and IV lasix  in the ED and underwent PD. Has had some UOP.  Echo last admission with preserved EF. - Continue IV Lasix  60 mg twice daily.  Nephrology following, appreciate their recommendations. - Increase Imdur  to 120 mg daily (was taking 60 mg daily at home.  Continue amlodipine  10 mg daily (he can titrate dose down if BP becomes soft with  increase in Imdur  dose) and continue metoprolol  succinate 100 mg daily. - Continue IV heparin  until 48 hours have been completed (tomorrow afternoon).  Then we will plan to transition back to home Eliquis  2.5 mg twice daily (weight and renal function).  -Mild and flat troponins most consistent with demand/supply mismatch and not ACS in the setting of acute heart failure after missed dialysis sessions.  No further cardiac diagnostics recommended at this time. - Agree with palliative and psychiatry evaluations.    This patient's plan of care was discussed and created with Dr. Wilburn and he is in agreement.  Signed: Danita Bloch, PA-C  01/24/2024, 4:07 PM [facility]      "

## 2024-01-24 NOTE — Progress Notes (Signed)
"  °  Peritoneal Dialysis Treatment Initiation Note     Pre Treatment Weight: 57.2 Kg   Consent signed and in chart.  PD treatment initiated via aseptic technique.    Patient is awake and alert. No complaints of pain.    PD exit site clean, dry and intact.  Gentamicin  and new dressing applied.    Hand-off given to the patient's nurse.  Education provided to dept staff  regarding PD machine and how  to contact tech support if machine  alarms.    Ozell Jubilee RN [facility] "

## 2024-01-24 NOTE — Consult Note (Signed)
 [facility] Psychiatric Consult Initial  Patient Name: .Casey French  MRN: [id number]  DOB: [birth year]  Consult Order details:  Orders (From admission, onward)     Start     Ordered   01/24/24 1348  IP CONSULT TO PSYCHIATRY       Ordering Provider: Dennise Capri, MD  Provider:  (Not yet assigned)  Question Answer Comment  Location [facility]   Reason for Consult? Depression Suicidal ideation      01/24/24 1348             Mode of Visit: In person    Psychiatry Consult Evaluation  Service Date: January 24, 2024 LOS:  LOS: 1 day  Chief Complaint psychiatry consult requested by medical team  Primary Psychiatric Diagnoses  Suicidal ideations   Assessment   Depression is highly prevalent in dialysis patients. The burden of chronic dialysis, including frequent treatments, dietary restrictions, fatigue, loss of independence, and declining functional status, significantly impacts quality of life and contributes to depressive symptoms in this population.  Patient does not meet [facility]  involuntary commitment criteria at this time. While collateral information from wife indicates patient made passive statements about wishing he could shoot himself to a friend, patient denies current suicidal ideation with plan or intent to this provider. Patient has no history of suicide attempts or self-harm behaviors. All firearms have been removed from the home by family, and wife reports patient has never attempted to access firearms to harm himself. Patient's statements appear to reflect frustration with the burden of dialysis and functional limitations rather than active intent to end his life. Patient does not present as an imminent danger to himself or others at this time, and a less restrictive intervention is appropriate.  Potential medication options, including sertraline , were discussed extensively with patient. Sertraline  has evidence for efficacy in  treating depression in dialysis patients and is generally well-tolerated in patients with end-stage renal disease. However, patient declined medication management at this time. The recommendation to participate in dialysis support groups for peer support and potential cognitive behavioral therapy interventions was discussed extensively with patient. These interventions have demonstrated benefit in improving quality of life, coping skills, and depressive symptoms in dialysis patients. Patient acknowledged these groups are available through his current dialysis provider but reported he has never participated because he did not feel he needed it. Patient is strongly encouraged to reconsider participation given his current symptoms and concerns.  It is important to note that this risk assessment reflects patient's current presentation at this moment in time. Suicide risk is not static and is constantly evolving based on changes in clinical status, stressors, symptom severity, access to means, support systems, and other dynamic factors. Patients with chronic medical conditions such as end-stage renal disease requiring dialysis are at elevated baseline risk for suicidal ideation and behavior due to the ongoing burden of illness, functional decline, and psychological distress associated with their medical condition. While patient does not meet criteria for involuntary commitment at this current assessment, ongoing monitoring and reassessment is essential, as risk can escalate or fluctuate over time. Changes in medical status, worsening depressive symptoms, increased hopelessness, treatment complications, or new psychosocial stressors could alter the risk profile. Therefore, continuous evaluation by the treatment team is necessary throughout the hospitalization and following discharge.  Wife verbalized understanding of the assessment, treatment recommendations, and reasons patient does not meet criteria for involuntary  commitment at this time. Psychiatry will continue to round on patient while he is  medically admitted to assess for any changes in presentation or updated psychiatric needs. Medical team is encouraged to notify psychiatry if patient expresses suicidal ideation, attempts self-harm, or exhibits other concerning behavioral changes. The above assessment and recommendations were relayed to the primary medical team.    Diagnoses:  Active Hospital problems: Principal Problem:   CHF (congestive heart failure) (HCC) Active Problems:   Acute pulmonary edema (HCC)    Plan   ## Psychiatric Medication Recommendations:  Patient declined at this time  ## Medical Decision Making Capacity: Not specifically addressed in this encounter  ## Further Work-up:   -- most recent EKG on 01/24/2024 had QtC of 484 -- Pertinent labwork reviewed earlier this admission includes: BMP, CBC   ## Disposition:-- There are no psychiatric contraindications to discharge at this time  ## Behavioral / Environmental: - No specific recommendations at this time.     ## Safety and Observation Level:  - Based on my clinical evaluation, I estimate the patient to be at low risk of self harm in the current setting. - At this time, we recommend  routine. This decision is based on my review of the chart including patient's history and current presentation, interview of the patient, mental status examination, and consideration of suicide risk including evaluating suicidal ideation, plan, intent, suicidal or self-harm behaviors, risk factors, and protective factors. This judgment is based on our ability to directly address suicide risk, implement suicide prevention strategies, and develop a safety plan while the patient is in the clinical setting. Please contact our team if there is a concern that risk level has changed.  CSSR Risk Category:C-SSRS RISK CATEGORY: No Risk  Suicide Risk Assessment: Patient has following modifiable risk  factors for suicide: triggering events, which we are addressing by utilizing therapeutic communication for patient to be able to discuss concerns. Patient has following non-modifiable or demographic risk factors for suicide: male gender Patient has the following protective factors against suicide: Supportive family, no history of suicide attempts, and no history of NSSIB  Thank you for this consult request. Recommendations have been communicated to the primary team.  We will sign off at this time.   Zelda Sharps, NP        History of Present Illness  Relevant Aspects of [facility]   Patient Report:  Patient was seen today for psychiatric evaluation. Patient denied suicidal ideation to this provider and denied any desire to go to sleep and not wake up. Patient denied any history of prior self-harm or suicide attempts. After further discussion, patient clarified that his concerns are more about the burden of dialysis rather than wanting to stop living. He stated he does not want to stop dialysis completely but is interested in cutting back on frequency due to significant fatigue and interference with his daily life. Patient endorsed some depressive symptoms related to functional limitations, specifically mentioning not being able to do things like mowing the yard anymore due to back pain.  Patient declined psychotropic medication management at this time. Dialysis support groups were discussed, and patient confirmed these are offered through his current dialysis provider. He reported he has not attended in the past because he does not feel he needs it. Patient provided permission for collateral information to be obtained from his wife.  Collateral information was obtained from patient's wife via telephone. Wife reported that patient had made statements to a friend of approximately 60 years that he wishes he could shoot himself. However, wife denied that patient has ever  tried to harm himself.  Wife reported that patient has no access to firearms or weapons, as the family has removed all firearms from the house. Patient is not aware the firearms have been removed. Wife reported it does not appear that patient has ever attempted to access a firearm to hurt himself.  On mental status examination, patient was alert and oriented to person, place, time, and situation. As stated above, patient denied current suicidal or homicidal ideation. He denied auditory or visual hallucinations. Patient reiterated that he does not want to stop dialysis but would like to cut back on it, as he finds it very draining at this time. On current examination, there was no evidence of mania or psychosis, and patient did not appear to be responding to internal stimuli.  Psych ROS:  Depression: Endorsed related to functional changes/decreased ability to do his normal activities that he previously enjoyed such as mowing the yard Anxiety: Denied Mania (lifetime and current): Denied Psychosis: (lifetime and current): Denied  Collateral information:  Contacted wife at number listed in chart on 01/24/2024    Psychiatric and Social History  Psychiatric History:  Information collected from patient and chart review  Prev Dx/Sx: Denied Current Psych Provider: Denied Home Meds (current): Denied Previous Med Trials: Denied Therapy: Denied  Prior Psych Hospitalization: Denied  Prior Self Harm: Denied Prior Violence: Denied  Family Psych History: Denied Family Hx suicide: Denied  Social History:    Occupational Hx: Not currently employed [employer] Hx: Denied Living Situation: With his wife  Access to weapons/lethal means: Denied  Substance History Alcohol: Denied Tobacco: Denied Illicit drugs: Denied Prescription drug abuse: Denied Rehab hx: Denied  Exam Findings  Physical Exam: Reviewed and agree with the physical exam findings conducted by the medical provider Vital Signs:  Temp:  [97.6 F (36.4  C)-98.1 F (36.7 C)] 97.6 F (36.4 C) (12/30 1050) Pulse Rate:  [67-122] 77 (12/30 1050) Resp:  [10-44] 18 (12/30 1050) BP: (124-184)/(80-103) 160/84 (12/30 1052) SpO2:  [90 %-100 %] 100 % (12/30 1050) FiO2 (%):  [40 %] 40 % (12/29 2139) Weight:  [60.1 kg] 60.1 kg (12/29 1900) Blood pressure (!) 160/84, pulse 77, temperature 97.6 F (36.4 C), temperature source Oral, resp. rate 18, weight 60.1 kg, SpO2 100%. Body mass index is 19.57 kg/m.    Mental Status Exam: General Appearance: Casual  Orientation:  Full (Time, Place, and Person)  Memory:  Good  Concentration:  Concentration: Good  Recall:  Good  Attention  Good  Eye Contact:  Good  Speech:  Clear and Coherent  Language:  Good  Volume:  Normal  Mood: Euthymic  Affect:  Appropriate and Congruent  Thought Process:  Coherent, Goal Directed, and Linear  Thought Content:  WDL and Logical  Suicidal Thoughts:  No  Homicidal Thoughts:  No  Judgement:  Fair  Insight:  Fair  Psychomotor Activity:  Normal  Akathisia:  No  Fund of Knowledge:  Fair      Assets:  Engineer, Maintenance Intimacy Social Support Transportation  Cognition:  WNL  ADL's:  Impaired  AIMS (if indicated):        Other History   These have been pulled in through the EMR, reviewed, and updated if appropriate.  Family History:  The patient's family history includes CAD in an other family member; Heart disease in his father and mother.  Medical History: Past Medical History:  Diagnosis Date   Acute diverticulitis 06/14/2014   Acute ST elevation myocardial infarction (STEMI) of  inferior wall (HCC) 2003   a.) PCI with Cypher stent to the RCA   Anemia of chronic renal failure    Aortic atherosclerosis    Bladder tumor    CAP (community acquired pneumonia) 06/2022   Cerebral microvascular disease    Coronary artery disease involving native coronary artery of native heart without angina pectoris 2003   a.) PCI with Cypher stent to the RCA  2003; b.) LHC 08/2003: 100% dRCA, 60% pLAD - med mgmt; c.) LHC/PCI 08/21/2015: occluded RCA at site of prior stent placement, 70% mLAD (FFR 0.79; 3.5 x 12 mm Xience DES), 50% dLAD (FFR 0.70); d.) MV 11/11/2016: no ischemia   Depression    Diverticulosis    End-stage renal disease on peritoneal dialysis (HCC)    GERD (gastroesophageal reflux disease)    GI bleed 05/15/2019   Gout    Heart murmur    Hematemesis 05/15/2019   Hepatic cyst    Hepatomegaly    Hyperlipidemia    Hypertension    Lacunar infarction (HCC) 02/11/2007   a.) CT head 02/11/2007: lacunar infarction involving thalamus and external capsule region on the RIGHT; b.) MRI brain 02/03/2020: multiple remote lacunar infarcts involving the RIGHT basal ganglia, RIGHT thalamus, and LEFT middle cerebellar peduncle   Long-term use of aspirin  therapy    Myocardial bridge    On apixaban  therapy    Pancolitis 09/2022   Paroxysmal atrial fibrillation (HCC)    a.) CHA2DS2VASc = 6 (age, HTN, CVA x 2, prior MI/vascular disease, T2DM) as of 03/11/2023; b.) rate/rhythm maintained on oral metoprolol  succinate; chronically anticoagulated with dose reduced apixaban    Renal cyst, right    Sigmoid diverticulitis 03/2022   T2DM (type 2 diabetes mellitus) (HCC)    Traumatic skull fracture and resulting subarachnoid hemorrhage 03/24/2013   a.) MOI was slipping and falling on ice   Unstable angina (HCC)     Surgical History: Past Surgical History:  Procedure Laterality Date   BLADDER INSTILLATION N/A 04/05/2023   Procedure: BLADDER INSTILLATION OF GEMCITABINE ;  Surgeon: Twylla Glendia BROCKS, MD;  Location: [facility];  Service: Urology;  Laterality: N/A;   CAPD INSERTION N/A 08/13/2020   Procedure: LAPAROSCOPIC INSERTION CONTINUOUS AMBULATORY PERITONEAL DIALYSIS  (CAPD) CATHETER;  Surgeon: Lane Shope, MD;  Location: [facility];  Service: General;  Laterality: N/A;   CARDIAC CATHETERIZATION     CATARACT EXTRACTION W/PHACO Right 07/05/2019    Procedure: CATARACT EXTRACTION PHACO AND INTRAOCULAR LENS PLACEMENT (IOC) RIGHT DIABETIC;  Surgeon: Jaye Fallow, MD;  Location: [facility];  Service: Ophthalmology;  Laterality: Right;  US00:59.3 CDE8.41 LOT [id number] h   COLONOSCOPY WITH PROPOFOL  N/A 04/01/2017   Procedure: COLONOSCOPY WITH PROPOFOL ;  Surgeon: Gaylyn Gladis PENNER, MD;  Location: [facility];  Service: Endoscopy;  Laterality: N/A;   CORONARY ANGIOPLASTY WITH STENT PLACEMENT  10/2001   RCA   CORONARY ANGIOPLASTY WITH STENT PLACEMENT Left 08/21/2015   Procedure: CORONARY ANGIOPLASTY WITH STENT PLACEMENT (mid LAD); Location: [facility]; Surgeon: Zachary Car, MD   ESOPHAGOGASTRODUODENOSCOPY N/A 04/24/2023   Procedure: EGD (ESOPHAGOGASTRODUODENOSCOPY);  Surgeon: Toledo, Ladell POUR, MD;  Location: [facility];  Service: Gastroenterology;  Laterality: N/A;   HAND SURGERY  2015   3 screws in right hand   TOTAL HIP ARTHROPLASTY Left 04/22/2020   Procedure: TOTAL HIP ARTHROPLASTY ANTERIOR APPROACH;  Surgeon: Kathlynn Sharper, MD;  Location: [facility];  Service: Orthopedics;  Laterality: Left;   TRANSURETHRAL RESECTION OF BLADDER TUMOR N/A 04/05/2023   Procedure: TURBT (TRANSURETHRAL RESECTION OF BLADDER TUMOR);  Surgeon: Twylla,  Glendia BROCKS, MD;  Location: [facility];  Service: Urology;  Laterality: N/A;     Medications:  Current Medications[1]  Allergies: Allergies[2]  Zelda Sharps, NP This note was created using Dragon dictation software. Please excuse any inadvertent transcription errors. Case was discussed with supervising physician Dr. Hellen who is agreeable with current plan.       [1]  Current Facility-Administered Medications:    acetaminophen  (TYLENOL ) tablet 650 mg, 650 mg, Oral, Q4H PRN, Zhang, Ping T, MD   amLODipine  (NORVASC ) tablet 10 mg, 10 mg, Oral, Daily, Niels Kayla FALCON, RPH, 10 mg at 01/24/24 1052   calcitRIOL  (ROCALTROL ) capsule 0.25 mcg, 0.25 mcg, Oral, Daily, Laurita Manor T, MD, 0.25 mcg at 01/24/24  1055   calcium  carbonate (TUMS - dosed in mg elemental calcium ) chewable tablet 400 mg of elemental calcium , 2 tablet, Oral, TID WC, Laurita Manor T, MD, 400 mg of elemental calcium  at 01/24/24 1133   dialysis solution 2.5% low-MG/low-CA dianeal  solution, , Intraperitoneal, Q24H, Breeze, Shantelle, NP   furosemide  (LASIX ) injection 60 mg, 60 mg, Intravenous, Q12H, Breeze, Shantelle, NP, 60 mg at 01/24/24 0410   gentamicin  cream (GARAMYCIN ) 0.1 % 1 Application, 1 Application, Topical, Daily, Breeze, Shantelle, NP, 1 Application at 01/24/24 1057   heparin  ADULT infusion 100 units/mL (25000 units/250mL), 800 Units/hr, Intravenous, Continuous, Laurita Manor T, MD, Last Rate: 8 mL/hr at 01/24/24 1058, 800 Units/hr at 01/24/24 1058   hydrALAZINE  (APRESOLINE ) injection 5 mg, 5 mg, Intravenous, Q6H PRN, Laurita Manor T, MD   HYDROmorphone  (DILAUDID ) injection 0.5 mg, 0.5 mg, Intravenous, Q4H PRN, Laurita Manor T, MD, 0.5 mg at 01/24/24 0120   isosorbide  mononitrate (IMDUR ) 24 hr tablet 30 mg, 30 mg, Oral, Daily, Laurita Manor T, MD, 30 mg at 01/24/24 1053   metoprolol  succinate (TOPROL -XL) 24 hr tablet 100 mg, 100 mg, Oral, Q breakfast, Laurita Manor T, MD, 100 mg at 01/24/24 9193   metoprolol  tartrate (LOPRESSOR ) injection 5 mg, 5 mg, Intravenous, NOW, Dana, Srobona Tublu, MD   ondansetron  (ZOFRAN ) injection 4 mg, 4 mg, Intravenous, Q6H PRN, Laurita Manor T, MD   pantoprazole  (PROTONIX ) EC tablet 40 mg, 40 mg, Oral, Daily, Laurita Manor T, MD, 40 mg at 01/24/24 1053   sodium bicarbonate  tablet 1,300 mg, 1,300 mg, Oral, BID, Laurita Manor T, MD, 1,300 mg at 01/24/24 1052   sodium chloride  flush (NS) 0.9 % injection 3 mL, 3 mL, Intravenous, Q12H, Laurita Manor T, MD, 3 mL at 01/24/24 1057   sodium chloride  flush (NS) 0.9 % injection 3 mL, 3 mL, Intravenous, PRN, Laurita Manor T, MD   sodium zirconium cyclosilicate  (LOKELMA ) packet 10 g, 10 g, Oral, Once, Goodman, Graydon, MD   zolpidem  (AMBIEN ) tablet 5 mg, 5 mg, Oral, QHS  PRN, Laurita Manor DASEN, MD  Current Outpatient Medications:    acetaminophen  (TYLENOL ) 325 MG tablet, Take 2 tablets (650 mg total) by mouth every 6 (six) hours as needed for mild pain (or Fever >/= 101)., Disp: , Rfl:    amLODipine  (NORVASC ) 10 MG tablet, Take 10 mg by mouth daily., Disp: , Rfl:    apixaban  (ELIQUIS ) 2.5 MG TABS tablet, Take 2.5 mg by mouth 2 (two) times daily., Disp: , Rfl:    calcitRIOL  (ROCALTROL ) 0.25 MCG capsule, Take 1 capsule (0.25 mcg total) by mouth daily., Disp: 30 capsule, Rfl: 0   calcium  carbonate (TUMS) 500 MG chewable tablet, Chew 2 tablets by mouth 3 (three) times daily with meals., Disp: , Rfl:    cyanocobalamin  (VITAMIN  B12) 500 MCG tablet, Take 1 tablet (500 mcg total) by mouth daily., Disp: 30 tablet, Rfl: 0   folic acid  (FOLVITE ) 1 MG tablet, Take 1 tablet (1 mg total) by mouth daily., Disp: 30 tablet, Rfl: 0   furosemide  (LASIX ) 80 MG tablet, Take 80 mg by mouth daily., Disp: , Rfl:    gabapentin  (NEURONTIN ) 300 MG capsule, Take 300 mg by mouth at bedtime., Disp: , Rfl: 11   hydrocortisone  (ANUSOL -HC) 2.5 % rectal cream, Place 1 application rectally 2 (two) times daily as needed for hemorrhoids or anal itching., Disp: , Rfl:    metoprolol  succinate (TOPROL -XL) 100 MG 24 hr tablet, Take 1 tablet (100 mg total) by mouth daily with breakfast. Take with or immediately following a meal., Disp: 30 tablet, Rfl: 0   nitroGLYCERIN  (NITROSTAT ) 0.4 MG SL tablet, Place 0.4 mg under the tongue every 5 (five) minutes as needed for chest pain., Disp: , Rfl:    omeprazole 20 MG TBDD disintegrating tablet, Take 20 mg by mouth daily., Disp: , Rfl:    ondansetron  (ZOFRAN ) 4 MG tablet, Take 4 mg by mouth every 8 (eight) hours as needed., Disp: , Rfl:    pantoprazole  (PROTONIX ) 40 MG tablet, Take 1 tablet (40 mg total) by mouth daily., Disp: 30 tablet, Rfl: 1   promethazine  (PHENERGAN ) 25 MG tablet, Take 1 tablet (25 mg total) by mouth every 8 (eight) hours as needed for refractory  nausea / vomiting., Disp: 20 tablet, Rfl: 0   sevelamer  carbonate (RENVELA ) 800 MG tablet, Take 800 mg by mouth 3 (three) times daily with meals., Disp: , Rfl:    sodium bicarbonate  650 MG tablet, Take 2 tablets (1,300 mg total) by mouth 2 (two) times daily., Disp: 120 tablet, Rfl: 0   acetaminophen  (TYLENOL ) 325 MG tablet, Take 2 tablets (650 mg total) by mouth every 6 (six) hours as needed for mild pain (pain score 1-3) or fever (or Fever >/= 101)., Disp: , Rfl:    escitalopram  (LEXAPRO ) 10 MG tablet, Take 10 mg by mouth daily. For 360 days (Patient not taking: Reported on 01/23/2024), Disp: , Rfl:    furosemide  (LASIX ) 20 MG tablet, Take 20 mg by mouth daily. (Patient not taking: Reported on 01/23/2024), Disp: , Rfl:    isosorbide  mononitrate (IMDUR ) 30 MG 24 hr tablet, Take 1 tablet (30 mg total) by mouth daily as needed (Systolic BP greater than 140)., Disp: 30 tablet, Rfl: 1 [2]  Allergies Allergen Reactions   Morphine  Anxiety    agitation

## 2024-01-24 NOTE — Evaluation (Signed)
 Occupational Therapy Evaluation Patient Details Name: Casey French MRN: [id number] DOB: 1951/01/26 Today's Date: 01/24/2024   History of Present Illness   Pt is a 73 year old male admitted with Acute on chronic HFpEF decompensation, Acute hypoxic respiratory failure, hyperkalemia       PMH significant for ESRD on PD MWF, PAF on Eliquis , HTN/chronic HFpEF, HLD, CAD, IIDM     Clinical Impressions Chart reviewed to date, pt greeted semi supine in bed, alert and oriented x4. He reports he is indep in ADL/IADL PTA, amb with no AD. Pt presents with deficits in activity tolerance and endurance on this date. Bed mobility completed with supervision, STS with supervision, lateral steps up the bed/simulated short amb transfers with supervision. SET UP required for feeding tasks. Pt provided education re energy conservation techniques with appropriate carry over. He will benefit from further education to improve ADL status. Pt is left sitting on edge of bed, all neesd met. OT will follow acutely.   HR to 138 bpm upon initial supine>sit. HR 92 bpm sitting on edge of bed eating breakfast. Spo2 >90% on 5L via [place] throughout. No dizziness reported during position changes.      If plan is discharge home, recommend the following:   A little help with bathing/dressing/bathroom;Assistance with cooking/housework     Functional Status Assessment   Patient has had a recent decline in their functional status and demonstrates the ability to make significant improvements in function in a reasonable and predictable amount of time.     Equipment Recommendations   Tub/shower seat     Recommendations for Other Services         Precautions/Restrictions   Precautions Precautions: Fall Recall of Precautions/Restrictions: Intact     Mobility Bed Mobility Overal bed mobility: Needs Assistance Bed Mobility: Supine to Sit, Sit to Supine     Supine to sit: Supervision, HOB elevated Sit to  supine: Supervision   General bed mobility comments: from ED stretcher    Transfers Overall transfer level: Needs assistance Equipment used: None Transfers: Sit to/from Stand Sit to Stand: Supervision                  Balance Overall balance assessment: Needs assistance Sitting-balance support: Feet supported Sitting balance-Leahy Scale: Normal     Standing balance support: No upper extremity supported Standing balance-Leahy Scale: Good                             ADL either performed or assessed with clinical judgement   ADL Overall ADL's : Needs assistance/impaired Eating/Feeding: Set up;Sitting   Grooming: Set up;Sitting               Lower Body Dressing: Supervision/safety;Sitting/lateral leans   Toilet Transfer: Supervision/safety;Ambulation Toilet Transfer Details (indicate cue type and reason): simulated                 Vision Patient Visual Report: No change from baseline       Perception         Praxis         Pertinent Vitals/Pain Pain Assessment Pain Assessment: Faces Faces Pain Scale: Hurts a little bit Pain Location: chest tightness     Extremity/Trunk Assessment Upper Extremity Assessment Upper Extremity Assessment: Overall WFL for tasks assessed   Lower Extremity Assessment Lower Extremity Assessment: Defer to PT evaluation       Communication Communication Communication: No apparent difficulties   Cognition  Arousal: Alert Behavior During Therapy: WFL for tasks assessed/performed Cognition: No apparent impairments                               Following commands: Intact       Cueing  General Comments   Cueing Techniques: Verbal cues      Exercises Other Exercises Other Exercises: edu re role of OT, role of rehab, discharge recommendations, energy conservation techniques   Shoulder Instructions      Home Living Family/patient expects to be discharged to:: Private  residence Living Arrangements: Spouse/significant other Available Help at Discharge: Family;Available 24 hours/day Type of Home: House Home Access: Stairs to enter [employer] of Steps: 4 Entrance Stairs-Rails: Right;Left;Can reach both Home Layout: One level     Bathroom Shower/Tub: Chief Strategy Officer: Standard     Home Equipment: [employer] (2 wheels);Cane - single point;BSC/3in1          Prior Functioning/Environment Prior Level of Function : Driving;Independent/Modified Independent                    OT Problem List: Decreased activity tolerance;Decreased knowledge of use of DME or AE;Decreased knowledge of precautions   OT Treatment/Interventions: Self-care/ADL training;Energy conservation;Patient/family education;Therapeutic activities      OT Goals(Current goals can be found in the care plan section)   Acute Rehab OT Goals Patient Stated Goal: improve function OT Goal Formulation: With patient Time For Goal Achievement: 02/07/24 Potential to Achieve Goals: Good ADL Goals Pt Will Perform Grooming: with modified independence;sitting;standing Pt Will Perform Lower Body Dressing: with modified independence;sitting/lateral leans;sit to/from stand Pt Will Transfer to Toilet: with modified independence;ambulating Pt Will Perform Toileting - Clothing Manipulation and hygiene: with modified independence;sitting/lateral leans;sit to/from stand Additional ADL Goal #1: Pt will improve ease of ADL completion as evidenced by reporting 3 energy conservation techniques during ADLs with no vcs required   OT Frequency:  Min 2X/week    Co-evaluation PT/OT/SLP Co-Evaluation/Treatment: Yes Reason for Co-Treatment:  (tolerance)          AM-PAC OT 6 Clicks Daily Activity     Outcome Measure Help from another person eating meals?: None Help from another person taking care of personal grooming?: None Help from another person toileting,  which includes using toliet, bedpan, or urinal?: None Help from another person bathing (including washing, rinsing, drying)?: A Little Help from another person to put on and taking off regular upper body clothing?: None Help from another person to put on and taking off regular lower body clothing?: A Little 6 Click Score: 22   End of Session Equipment Utilized During Treatment: Oxygen Nurse Communication: Mobility status  Activity Tolerance: Patient tolerated treatment well Patient left: with call bell/phone within reach (sitting on edge of bed)  OT Visit Diagnosis: Other abnormalities of gait and mobility (R26.89)                Time: 9093-9080 OT Time Calculation (min): 13 min Charges:  OT General Charges $OT Visit: 1 Visit OT Evaluation $OT Eval Moderate Complexity: 1 Mod  Therisa Sheffield, OTD OTR/L  01/24/2024, 9:42 AM

## 2024-01-25 ENCOUNTER — Inpatient Hospital Stay

## 2024-01-25 DIAGNOSIS — R0602 Shortness of breath: Secondary | ICD-10-CM

## 2024-01-25 DIAGNOSIS — I440 Atrioventricular block, first degree: Secondary | ICD-10-CM

## 2024-01-25 LAB — BASIC METABOLIC PANEL WITH GFR
Anion gap: 15 (ref 5–15)
BUN: 64 mg/dL — ABNORMAL HIGH (ref 8–23)
CO2: 20 mmol/L — ABNORMAL LOW (ref 22–32)
Calcium: 7.4 mg/dL — ABNORMAL LOW (ref 8.9–10.3)
Chloride: 106 mmol/L (ref 98–111)
Creatinine, Ser: 7.49 mg/dL — ABNORMAL HIGH (ref 0.61–1.24)
GFR, Estimated: 7 mL/min — ABNORMAL LOW
Glucose, Bld: 106 mg/dL — ABNORMAL HIGH (ref 70–99)
Potassium: 4.4 mmol/L (ref 3.5–5.1)
Sodium: 141 mmol/L (ref 135–145)

## 2024-01-25 LAB — CBC
HCT: 23.7 % — ABNORMAL LOW (ref 39.0–52.0)
Hemoglobin: 7.5 g/dL — ABNORMAL LOW (ref 13.0–17.0)
MCH: 29.5 pg (ref 26.0–34.0)
MCHC: 31.6 g/dL (ref 30.0–36.0)
MCV: 93.3 fL (ref 80.0–100.0)
Platelets: 210 K/uL (ref 150–400)
RBC: 2.54 MIL/uL — ABNORMAL LOW (ref 4.22–5.81)
RDW: 16.9 % — ABNORMAL HIGH (ref 11.5–15.5)
WBC: 7 K/uL (ref 4.0–10.5)
nRBC: 0 % (ref 0.0–0.2)

## 2024-01-25 LAB — HEPARIN LEVEL (UNFRACTIONATED): Heparin Unfractionated: 0.3 [IU]/mL (ref 0.30–0.70)

## 2024-01-25 LAB — TROPONIN T, HIGH SENSITIVITY: Troponin T High Sensitivity: 255 ng/L (ref 0–19)

## 2024-01-25 MED ORDER — HYDROXYZINE HCL 10 MG PO TABS
10.0000 mg | ORAL_TABLET | Freq: Three times a day (TID) | ORAL | Status: DC | PRN
  Administered 2024-01-26: 10 mg via ORAL
  Filled 2024-01-25 (×3): qty 1

## 2024-01-25 MED ORDER — APIXABAN 2.5 MG PO TABS
2.5000 mg | ORAL_TABLET | Freq: Two times a day (BID) | ORAL | Status: DC
  Administered 2024-01-25 (×2): 2.5 mg via ORAL
  Filled 2024-01-25 (×3): qty 1

## 2024-01-25 MED ORDER — IPRATROPIUM-ALBUTEROL 0.5-2.5 (3) MG/3ML IN SOLN
3.0000 mL | Freq: Once | RESPIRATORY_TRACT | Status: AC
  Administered 2024-01-25: 3 mL via RESPIRATORY_TRACT

## 2024-01-25 MED ORDER — ALUM & MAG HYDROXIDE-SIMETH 200-200-20 MG/5ML PO SUSP
30.0000 mL | Freq: Once | ORAL | Status: AC
  Administered 2024-01-25: 30 mL via ORAL
  Filled 2024-01-25: qty 30

## 2024-01-25 MED ORDER — TORSEMIDE 20 MG PO TABS
100.0000 mg | ORAL_TABLET | Freq: Every day | ORAL | Status: DC
  Administered 2024-01-26 – 2024-01-27 (×2): 100 mg via ORAL
  Filled 2024-01-25 (×2): qty 5

## 2024-01-25 MED ORDER — ISOSORBIDE MONONITRATE ER 60 MG PO TB24
120.0000 mg | ORAL_TABLET | Freq: Two times a day (BID) | ORAL | Status: DC
  Administered 2024-01-25 – 2024-01-27 (×4): 120 mg via ORAL
  Filled 2024-01-25 (×4): qty 2

## 2024-01-25 NOTE — Progress Notes (Signed)
 Occupational Therapy Treatment Patient Details Name: Casey French MRN: [id number] DOB: 1950-07-10 Today's Date: 01/25/2024   History of present illness Pt is a 73 year old male admitted with Acute on chronic HFpEF decompensation, Acute hypoxic respiratory failure, hyperkalemia       PMH significant for ESRD on PD MWF, PAF on Eliquis , HTN/chronic HFpEF, HLD, CAD, IIDM   OT comments  Mr Kundrat was seen for OT treatment on this date. Upon arrival to room pt in bed, agreeable to tx. Pt requires SUPERVISION toilet t/f no AD use. Trialed on RA with desat to mid 80s in sitting, with activity on 2L [place] SpO2 95%. Reviewed ECS, Upon return to bed pt endorses swimmy headedness, RN notified. Pt making good progress toward goals, will continue to follow POC. Discharge recommendation remains appropriate.        If plan is discharge home, recommend the following:  A little help with bathing/dressing/bathroom;Assistance with cooking/housework   Equipment Recommendations  Tub/shower seat    Recommendations for Other Services      Precautions / Restrictions Precautions Precautions: Fall Recall of Precautions/Restrictions: Intact Restrictions Weight Bearing Restrictions Per Provider Order: No       Mobility Bed Mobility Overal bed mobility: Modified Independent                  Transfers Overall transfer level: Needs assistance Equipment used: None Transfers: Sit to/from Stand Sit to Stand: Supervision                 Balance Overall balance assessment: Needs assistance Sitting-balance support: Feet supported Sitting balance-Leahy Scale: Normal     Standing balance support: No upper extremity supported Standing balance-Leahy Scale: Good                             ADL either performed or assessed with clinical judgement   ADL Overall ADL's : Needs assistance/impaired                                       General ADL Comments:  SUPERVISION toilet t/f no AD use     Communication Communication Communication: No apparent difficulties   Cognition Arousal: Alert Behavior During Therapy: WFL for tasks assessed/performed Cognition: No apparent impairments                                                      Pertinent Vitals/ Pain       Pain Assessment Pain Assessment: Faces Faces Pain Scale: Hurts a little bit Pain Location: chest tightness Pain Descriptors / Indicators: Tightness Pain Intervention(s): Limited activity within patient's tolerance, Repositioned   Frequency  Min 2X/week        Progress Toward Goals  OT Goals(current goals can now be found in the care plan section)  Progress towards OT goals: Progressing toward goals  Acute Rehab OT Goals OT Goal Formulation: With patient Time For Goal Achievement: 02/07/24 Potential to Achieve Goals: Good ADL Goals Pt Will Perform Grooming: with modified independence;sitting;standing Pt Will Perform Lower Body Dressing: with modified independence;sitting/lateral leans;sit to/from stand Pt Will Transfer to Toilet: with modified independence;ambulating Pt Will Perform Toileting - Clothing Manipulation and hygiene: with modified independence;sitting/lateral leans;sit  to/from stand Additional ADL Goal #1: Pt will improve ease of ADL completion as evidenced by reporting 3 energy conservation techniques during ADLs with no vcs required  Plan      Co-evaluation                 AM-PAC OT 6 Clicks Daily Activity     Outcome Measure   Help from another person eating meals?: None Help from another person taking care of personal grooming?: None Help from another person toileting, which includes using toliet, bedpan, or urinal?: None Help from another person bathing (including washing, rinsing, drying)?: A Little Help from another person to put on and taking off regular upper body clothing?: None Help from another person to  put on and taking off regular lower body clothing?: A Little 6 Click Score: 22    End of Session Equipment Utilized During Treatment: Oxygen  OT Visit Diagnosis: Other abnormalities of gait and mobility (R26.89)   Activity Tolerance Patient tolerated treatment well   Patient Left in bed;with call bell/phone within reach   Nurse Communication Mobility status        Time: 8879-8854 OT Time Calculation (min): 25 min  Charges: OT General Charges $OT Visit: 1 Visit OT Treatments $Self Care/Home Management : 23-37 mins  Elston Slot, M.S. OTR/L  01/25/2024, 1:16 PM  ascom [phone]

## 2024-01-25 NOTE — Progress Notes (Incomplete)
" °  ° °      Overnight   NAME: KIPPER BUCH MRN: [id number] DOB : October 06, 1950    Date of Service   01/25/2024   HPI/Events of Note   HPI: 73 year old male with end-stage renal disease on PD Monday Wednesday Friday, PAF on Eliquis , hypertension with chronic heart failure.  Ejection fraction, HLD, CAD, type 2 diabetes, presented with worsening of chest pain and shortness of breath after cutting down his home PD because he was feeling unwell.  Workup in the ED revealed pulmonary edema on chest x-ray and patient was treated with Lasix  and placed on BiPAP.  Patient was treated with hemodialysis with improvement however developed A-fib RVR.  Elevation in troponin was thought to be type II NSTEMI in setting of increased filling pressures, pulmonary congestion having missed dialysis.  Echocardiogram without any new wall motion abnormalities.  Patient spontaneously converted back to normal sinus rhythm.  Patient currently receiving ongoing PD however is still reporting shortness of breath.   Overnight: RN notified me of worsening shortness of breath, chest tightness and anxiety while receiving PD on evaluation of patient he is on nasal cannula.  DuoNeb and Atarax ordered.  Patient reported mild improvement with DuoNeb however was still having shortness of breath chest x-ray ordered.  Physical Exam Constitutional:      General: He is in acute distress.  Eyes:     Pupils: Pupils are equal, round, and reactive to light.  Cardiovascular:     Rate and Rhythm: Normal rate and regular rhythm.     Pulses:          Radial pulses are 2+ on the right side and 2+ on the left side.     Heart sounds: Normal heart sounds.  Pulmonary:     Effort: Tachypnea and respiratory distress present.     Breath sounds: Examination of the right-upper field reveals decreased breath sounds. Examination of the left-upper field reveals decreased breath sounds. Examination of the right-middle field reveals decreased breath  sounds. Examination of the left-middle field reveals decreased breath sounds. Decreased breath sounds present.  Skin:    General: Skin is warm and dry.     Capillary Refill: Capillary refill takes 2 to 3 seconds.     Coloration: Skin is pale.  Neurological:     General: No focal deficit present.     Mental Status: He is alert and oriented to person, place, and time.       Interventions/ Plan   EKG normal sinus with first-degree AV block Cxray Atrax  Pain meds- PRN medication as needed   Updates 0015-rapid response was called due to worsening shortness of breath.  On arrival to room rapid response nurse, attending physician, charge nurse, and bedside RN present at bedside.  Patient was tachypneic hypertensive and appeared anxious.  At this time 40 mg IV Lasix  was ordered, half an inch of Nitropaste, Compazine, VBG, troponin, CBC, BNP.  - Troponin 220 down from yesterday which was 255 - VBG with improvement in CO2, mild metabolic acidosis  0130-RN reports patient is resting more comfortably now with decrease in blood pressure.    Gerold Sar Donati- Aram BSN RN CCRN AGACNP-BC Acute Care Nurse Practitioner [facility] [place]  "

## 2024-01-25 NOTE — Care Management Important Message (Signed)
 Important Message  Patient Details  Name: Casey French MRN: [id number] Date of Birth: Oct 15, 1950   Important Message Given:  Yes - Medicare IM     [name] [name] 01/25/2024, 2:43 PM

## 2024-01-25 NOTE — Consult Note (Signed)
 [facility] Psychiatric Consult Follow Up  Patient Name: [name]  MRN: [id number]  DOB: 19-Sep-1950  Consult Order details:  Orders (From admission, onward)     Start     Ordered   01/24/24 1348  IP CONSULT TO PSYCHIATRY       Ordering Provider: Dennise Capri, MD  Provider:  (Not yet assigned)  Question Answer Comment  Location [facility]   Reason for Consult? Depression Suicidal ideation      01/24/24 1348             Mode of Visit: In person    Psychiatry Consult Evaluation  Service Date: January 25, 2024 LOS:  LOS: 2 days  Chief Complaint psychiatry consult requested by medical team  Primary Psychiatric Diagnoses  Suicidal ideations   Assessment   Depression is highly prevalent in dialysis patients. The burden of chronic dialysis, including frequent treatments, dietary restrictions, fatigue, loss of independence, and declining functional status, significantly impacts quality of life and contributes to depressive symptoms in this population.  Patient does not meet [place]  involuntary commitment criteria at this time. While collateral information from wife indicates patient made passive statements about wishing he could shoot himself to a friend, patient denies current suicidal ideation with plan or intent to this provider. Patient has no history of suicide attempts or self-harm behaviors. All firearms have been removed from the home by family, and wife reports patient has never attempted to access firearms to harm himself. Patient's statements appear to reflect frustration with the burden of dialysis and functional limitations rather than active intent to end his life. Patient does not present as an imminent danger to himself or others at this time, and a less restrictive intervention is appropriate.  Potential medication options, including sertraline , were discussed extensively with patient. Sertraline  has evidence for efficacy in  treating depression in dialysis patients and is generally well-tolerated in patients with end-stage renal disease. However, patient declined medication management at this time. The recommendation to participate in dialysis support groups for peer support and potential cognitive behavioral therapy interventions was discussed extensively with patient. These interventions have demonstrated benefit in improving quality of life, coping skills, and depressive symptoms in dialysis patients. Patient acknowledged these groups are available through his current dialysis provider but reported he has never participated because he did not feel he needed it. Patient is strongly encouraged to reconsider participation given his current symptoms and concerns.  It is important to note that this risk assessment reflects patient's current presentation at this moment in time. Suicide risk is not static and is constantly evolving based on changes in clinical status, stressors, symptom severity, access to means, support systems, and other dynamic factors. Patients with chronic medical conditions such as end-stage renal disease requiring dialysis are at elevated baseline risk for suicidal ideation and behavior due to the ongoing burden of illness, functional decline, and psychological distress associated with their medical condition. While patient does not meet criteria for involuntary commitment at this current assessment, ongoing monitoring and reassessment is essential, as risk can escalate or fluctuate over time. Changes in medical status, worsening depressive symptoms, increased hopelessness, treatment complications, or new psychosocial stressors could alter the risk profile. Therefore, continuous evaluation by the treatment team is necessary throughout the hospitalization and following discharge.  Wife verbalized understanding of the assessment, treatment recommendations, and reasons patient does not meet criteria for involuntary  commitment at this time. Psychiatry will continue to round on patient while he  is medically admitted to assess for any changes in presentation or updated psychiatric needs. Medical team is encouraged to notify psychiatry if patient expresses suicidal ideation, attempts self-harm, or exhibits other concerning behavioral changes. The above assessment and recommendations were relayed to the primary medical team.  01/25/2024: Psychiatry attempted to round on patient today. However, nursing team was at bedside actively assessing patient for reported chest tightness. Nursing staff was in the process of obtaining vital signs and administering medications to address patient's acute medical concern at the time psychiatry arrived. Psychiatric assessment was deferred at this time to allow nursing and medical team to prioritize evaluation and management of patient's chest pain complaint.  Psychiatry will continue to round on patient while medically admitted.    Diagnoses:  Active Hospital problems: Principal Problem:   CHF (congestive heart failure) (HCC) Active Problems:   Acute pulmonary edema (HCC)   Passive suicidal ideations    Plan   ## Psychiatric Medication Recommendations:  Patient declined at this time  ## Medical Decision Making Capacity: Not specifically addressed in this encounter  ## Further Work-up:   -- most recent EKG on 01/24/2024 had QtC of 484 -- Pertinent labwork reviewed earlier this admission includes: BMP, CBC   ## Disposition:-- There are no psychiatric contraindications to discharge at this time  ## Behavioral / Environmental: - No specific recommendations at this time.     ## Safety and Observation Level:  - Based on my clinical evaluation, I estimate the patient to be at low risk of self harm in the current setting. - At this time, we recommend  routine. This decision is based on my review of the chart including patient's history and current presentation, interview  of the patient, mental status examination, and consideration of suicide risk including evaluating suicidal ideation, plan, intent, suicidal or self-harm behaviors, risk factors, and protective factors. This judgment is based on our ability to directly address suicide risk, implement suicide prevention strategies, and develop a safety plan while the patient is in the clinical setting. Please contact our team if there is a concern that risk level has changed.  CSSR Risk Category:C-SSRS RISK CATEGORY: No Risk  Suicide Risk Assessment: Patient has following modifiable risk factors for suicide: triggering events, which we are addressing by utilizing therapeutic communication for patient to be able to discuss concerns. Patient has following non-modifiable or demographic risk factors for suicide: male gender Patient has the following protective factors against suicide: Supportive family, no history of suicide attempts, and no history of NSSIB  Thank you for this consult request. Recommendations have been communicated to the primary team.  We will sign off at this time.   Zelda Sharps, NP        History of Present Illness  Relevant Aspects of [facility]   Patient Report:  Patient was seen today for psychiatric evaluation. Patient denied suicidal ideation to this provider and denied any desire to go to sleep and not wake up. Patient denied any history of prior self-harm or suicide attempts. After further discussion, patient clarified that his concerns are more about the burden of dialysis rather than wanting to stop living. He stated he does not want to stop dialysis completely but is interested in cutting back on frequency due to significant fatigue and interference with his daily life. Patient endorsed some depressive symptoms related to functional limitations, specifically mentioning not being able to do things like mowing the yard anymore due to back pain.  Patient declined psychotropic  medication  management at this time. Dialysis support groups were discussed, and patient confirmed these are offered through his current dialysis provider. He reported he has not attended in the past because he does not feel he needs it. Patient provided permission for collateral information to be obtained from his wife.  Collateral information was obtained from patient's wife via telephone. Wife reported that patient had made statements to a friend of approximately 60 years that he wishes he could shoot himself. However, wife denied that patient has ever tried to harm himself. Wife reported that patient has no access to firearms or weapons, as the family has removed all firearms from the house. Patient is not aware the firearms have been removed. Wife reported it does not appear that patient has ever attempted to access a firearm to hurt himself.  On mental status examination, patient was alert and oriented to person, place, time, and situation. As stated above, patient denied current suicidal or homicidal ideation. He denied auditory or visual hallucinations. Patient reiterated that he does not want to stop dialysis but would like to cut back on it, as he finds it very draining at this time. On current examination, there was no evidence of mania or psychosis, and patient did not appear to be responding to internal stimuli.  Psych ROS:  Depression: Endorsed related to functional changes/decreased ability to do his normal activities that he previously enjoyed such as mowing the yard Anxiety: Denied Mania (lifetime and current): Denied Psychosis: (lifetime and current): Denied  Collateral information:  Contacted wife at number listed in chart on 01/24/2024    Psychiatric and Social History  Psychiatric History:  Information collected from patient and chart review  Prev Dx/Sx: Denied Current Psych Provider: Denied Home Meds (current): Denied Previous Med Trials: Denied Therapy: Denied  Prior  Psych Hospitalization: Denied  Prior Self Harm: Denied Prior Violence: Denied  Family Psych History: Denied Family Hx suicide: Denied  Social History:    Occupational Hx: Not currently employed [employer] Hx: Denied Living Situation: With his wife  Access to weapons/lethal means: Denied  Substance History Alcohol: Denied Tobacco: Denied Illicit drugs: Denied Prescription drug abuse: Denied Rehab hx: Denied  Exam Findings  Physical Exam: Reviewed and agree with the physical exam findings conducted by the medical provider Vital Signs:  Temp:  [97.5 F (36.4 C)-98.3 F (36.8 C)] 97.5 F (36.4 C) (12/31 0433) Pulse Rate:  [68-94] 84 (12/31 1014) Resp:  [18-22] 18 (12/31 0433) BP: (126-160)/(72-87) 155/84 (12/31 1014) SpO2:  [97 %-100 %] 97 % (12/31 0704) Weight:  [57.2 kg-61.1 kg] 61.1 kg (12/31 0500) Blood pressure (!) 155/84, pulse 84, temperature (!) 97.5 F (36.4 C), resp. rate 18, height 5' 9 (1.753 m), weight 61.1 kg, SpO2 97%. Body mass index is 19.89 kg/m.    Mental Status Exam: General Appearance: Casual  Orientation:  Full (Time, Place, and Person)  Memory:  Good  Concentration:  Concentration: Good  Recall:  Good  Attention  Good  Eye Contact:  Good  Speech:  Clear and Coherent  Language:  Good  Volume:  Normal  Mood: Euthymic  Affect:  Appropriate and Congruent  Thought Process:  Coherent, Goal Directed, and Linear  Thought Content:  WDL and Logical  Suicidal Thoughts:  No  Homicidal Thoughts:  No  Judgement:  Fair  Insight:  Fair  Psychomotor Activity:  Normal  Akathisia:  No  Fund of Knowledge:  Fair      Assets:  Teacher, Early Years/pre  Cognition:  WNL  ADL's:  Impaired  AIMS (if indicated):        Other History   These have been pulled in through the EMR, reviewed, and updated if appropriate.  Family History:  The patient's family history includes CAD in an other family member; Heart  disease in his father and mother.  Medical History: Past Medical History:  Diagnosis Date   Acute diverticulitis 06/14/2014   Acute ST elevation myocardial infarction (STEMI) of inferior wall (HCC) 2003   a.) PCI with Cypher stent to the RCA   Anemia of chronic renal failure    Aortic atherosclerosis    Bladder tumor    CAP (community acquired pneumonia) 06/2022   Cerebral microvascular disease    Coronary artery disease involving native coronary artery of native heart without angina pectoris 2003   a.) PCI with Cypher stent to the RCA 2003; b.) LHC 08/2003: 100% dRCA, 60% pLAD - med mgmt; c.) LHC/PCI 08/21/2015: occluded RCA at site of prior stent placement, 70% mLAD (FFR 0.79; 3.5 x 12 mm Xience DES), 50% dLAD (FFR 0.70); d.) MV 11/11/2016: no ischemia   Depression    Diverticulosis    End-stage renal disease on peritoneal dialysis (HCC)    GERD (gastroesophageal reflux disease)    GI bleed 05/15/2019   Gout    Heart murmur    Hematemesis 05/15/2019   Hepatic cyst    Hepatomegaly    Hyperlipidemia    Hypertension    Lacunar infarction (HCC) 02/11/2007   a.) CT head 02/11/2007: lacunar infarction involving thalamus and external capsule region on the RIGHT; b.) MRI brain 02/03/2020: multiple remote lacunar infarcts involving the RIGHT basal ganglia, RIGHT thalamus, and LEFT middle cerebellar peduncle   Long-term use of aspirin  therapy    Myocardial bridge    On apixaban  therapy    Pancolitis 09/2022   Paroxysmal atrial fibrillation (HCC)    a.) CHA2DS2VASc = 6 (age, HTN, CVA x 2, prior MI/vascular disease, T2DM) as of 03/11/2023; b.) rate/rhythm maintained on oral metoprolol  succinate; chronically anticoagulated with dose reduced apixaban    Renal cyst, right    Sigmoid diverticulitis 03/2022   T2DM (type 2 diabetes mellitus) (HCC)    Traumatic skull fracture and resulting subarachnoid hemorrhage 03/24/2013   a.) MOI was slipping and falling on ice   Unstable angina (HCC)      Surgical History: Past Surgical History:  Procedure Laterality Date   BLADDER INSTILLATION N/A 04/05/2023   Procedure: BLADDER INSTILLATION OF GEMCITABINE ;  Surgeon: Twylla Glendia BROCKS, MD;  Location: [facility] ORS;  Service: Urology;  Laterality: N/A;   CAPD INSERTION N/A 08/13/2020   Procedure: LAPAROSCOPIC INSERTION CONTINUOUS AMBULATORY PERITONEAL DIALYSIS  (CAPD) CATHETER;  Surgeon: Lane Shope, MD;  Location: [facility] ORS;  Service: General;  Laterality: N/A;   CARDIAC CATHETERIZATION     CATARACT EXTRACTION W/PHACO Right 07/05/2019   Procedure: CATARACT EXTRACTION PHACO AND INTRAOCULAR LENS PLACEMENT (IOC) RIGHT DIABETIC;  Surgeon: Jaye Fallow, MD;  Location: [facility] ORS;  Service: Ophthalmology;  Laterality: Right;  US00:59.3 CDE8.41 LOT [id number] h   COLONOSCOPY WITH PROPOFOL  N/A 04/01/2017   Procedure: COLONOSCOPY WITH PROPOFOL ;  Surgeon: Gaylyn Gladis PENNER, MD;  Location: [facility] ENDOSCOPY;  Service: Endoscopy;  Laterality: N/A;   CORONARY ANGIOPLASTY WITH STENT PLACEMENT  10/2001   RCA   CORONARY ANGIOPLASTY WITH STENT PLACEMENT Left 08/21/2015   Procedure: CORONARY ANGIOPLASTY WITH STENT PLACEMENT (mid LAD); Location: [facility]; Surgeon: Zachary Car, MD   ESOPHAGOGASTRODUODENOSCOPY N/A 04/24/2023   Procedure: EGD (ESOPHAGOGASTRODUODENOSCOPY);  Surgeon: Toledo, Ladell POUR, MD;  Location: [facility] ENDOSCOPY;  Service: Gastroenterology;  Laterality: N/A;   HAND SURGERY  2015   3 screws in right hand   TOTAL HIP ARTHROPLASTY Left 04/22/2020   Procedure: TOTAL HIP ARTHROPLASTY ANTERIOR APPROACH;  Surgeon: Kathlynn Sharper, MD;  Location: [facility] ORS;  Service: Orthopedics;  Laterality: Left;   TRANSURETHRAL RESECTION OF BLADDER TUMOR N/A 04/05/2023   Procedure: TURBT (TRANSURETHRAL RESECTION OF BLADDER TUMOR);  Surgeon: Twylla Glendia BROCKS, MD;  Location: [facility]S;  Service: Urology;  Laterality: N/A;     Medications:  Current Medications[1]  Allergies: Allergies[2]  Zelda Sharps, NP This note  was created using Dragon dictation software. Please excuse any inadvertent transcription errors. Case was discussed with supervising physician Dr. Ruther who is agreeable with current plan.       [1]  Current Facility-Administered Medications:    acetaminophen  (TYLENOL ) tablet 650 mg, 650 mg, Oral, Q4H PRN, Zhang, Ping T, MD   alum & mag hydroxide-simeth (MAALOX/MYLANTA) 200-200-20 MG/5ML suspension 30 mL, 30 mL, Oral, Once, Dennise Capri, MD   amLODipine  (NORVASC ) tablet 10 mg, 10 mg, Oral, Daily, Niels Kayla FALCON, RPH, 10 mg at 01/25/24 1014   apixaban  (ELIQUIS ) tablet 2.5 mg, 2.5 mg, Oral, BID, Dana, Srobona Tublu, MD   calcitRIOL  (ROCALTROL ) capsule 0.25 mcg, 0.25 mcg, Oral, Daily, Zhang, Ping T, MD, 0.25 mcg at 01/24/24 1055   calcium  carbonate (TUMS - dosed in mg elemental calcium ) chewable tablet 400 mg of elemental calcium , 2 tablet, Oral, TID WC, Laurita Manor T, MD, 400 mg of elemental calcium  at 01/25/24 1014   dialysis solution 2.5% low-MG/low-CA dianeal  solution, , Intraperitoneal, Q24H, Breeze, Shantelle, NP, 6,000 mL at 01/24/24 1944   epoetin  alfa-epbx (RETACRIT ) injection 20,000 Units, 20,000 Units, Subcutaneous, Weekly, Breeze, Shantelle, NP   furosemide  (LASIX ) injection 60 mg, 60 mg, Intravenous, Q12H, Breeze, Shantelle, NP, 60 mg at 01/25/24 0505   gentamicin  cream (GARAMYCIN ) 0.1 % 1 Application, 1 Application, Topical, Daily, Breeze, Shantelle, NP, 1 Application at 01/25/24 1016   hydrALAZINE  (APRESOLINE ) injection 5 mg, 5 mg, Intravenous, Q6H PRN, Laurita Manor T, MD   HYDROmorphone  (DILAUDID ) injection 0.5 mg, 0.5 mg, Intravenous, Q4H PRN, Laurita Manor T, MD, 0.5 mg at 01/25/24 9340   isosorbide  mononitrate (IMDUR ) 24 hr tablet 120 mg, 120 mg, Oral, Daily, Hudson, Caralyn, PA-C, 120 mg at 01/25/24 1014   metoprolol  succinate (TOPROL -XL) 24 hr tablet 100 mg, 100 mg, Oral, Q breakfast, Laurita, Ping T, MD, 100 mg at 01/25/24 1014   ondansetron  (ZOFRAN ) injection 4 mg,  4 mg, Intravenous, Q6H PRN, Laurita Manor T, MD   pantoprazole  (PROTONIX ) EC tablet 40 mg, 40 mg, Oral, Daily, Laurita, Ping T, MD, 40 mg at 01/25/24 1014   sodium bicarbonate  tablet 1,300 mg, 1,300 mg, Oral, BID, Zhang, Ping T, MD, 1,300 mg at 01/25/24 1014   sodium chloride  flush (NS) 0.9 % injection 3 mL, 3 mL, Intravenous, Q12H, Zhang, Ping T, MD, 3 mL at 01/25/24 1016   sodium chloride  flush (NS) 0.9 % injection 3 mL, 3 mL, Intravenous, PRN, Laurita Manor T, MD   sodium zirconium cyclosilicate  (LOKELMA ) packet 10 g, 10 g, Oral, Once, Goodman, Graydon, MD   zolpidem  (AMBIEN ) tablet 5 mg, 5 mg, Oral, QHS PRN, Laurita Manor T, MD, 5 mg at 01/24/24 2123 [2]  Allergies Allergen Reactions   Morphine  Anxiety    agitation

## 2024-01-25 NOTE — Progress Notes (Signed)
 "  PROGRESS NOTE    Casey French  FMW:[id number]  DOB: [birth year]  DOA: 01/23/2024 PCP: Valora Lynwood FALCON, MD Outpatient Specialists:   Hospital course: 73 year old old male with ESRD on PD MWF, PAF on Eliquis , HTN/chronic HFpEF, HLD, CAD, IIDM, presented with worsening of chest pain and shortness of breath after cutting down on his home PD because he was feeling unwell.  Workup in the ED revealed pulmonary edema on chest x-ray and patient was treated with Lasix  and placed on BiPAP.  Patient was treated with hemodialysis with improvement however developed A-fib with RVR.  Elevation in troponin was thought to be type II NSTEMI in setting of increased filling pressures, pulmonary congestion having missed dialysis.  Echocardiogram without any new wall motion abnormalities.  Patient spontaneously converted back to normal sinus rhythm.   Subjective:  Patient feels better than yesterday but still is somewhat short of breath, not back to baseline.  Notes he feels quite tired.   Objective: Vitals:   01/25/24 0433 01/25/24 0500 01/25/24 0704 01/25/24 1014  BP: (!) 152/78  (!) 150/74 (!) 155/84  Pulse: 87  94 84  Resp: 18     Temp: (!) 97.5 F (36.4 C)     TempSrc:      SpO2: 97%  97%   Weight:  61.1 kg    Height:        Intake/Output Summary (Last 24 hours) at 01/25/2024 1026 Last data filed at 01/25/2024 1016 Gross per 24 hour  Intake 335.65 ml  Output 771 ml  Net -435.35 ml   Filed Weights   01/23/24 1900 01/24/24 2045 01/25/24 0500  Weight: 60.1 kg 57.2 kg 61.1 kg     Exam:  General: Patient sitting up in bed in no distress, appears tired. Eyes: sclera anicteric, conjuctiva mild injection bilaterally CVS: S1-S2, regular  Respiratory: Bilateral rales at bases  GI: NABS, soft, NT  LE: Warm and well-perfused Neuro: A/O x 3 Psych: patient is logical and coherent, judgement and insight appear normal, mood and affect appropriate to situation.  Data  Reviewed:  Basic Metabolic Panel: Recent Labs  Lab 01/23/24 0527 01/23/24 1232 01/24/24 0453 01/25/24 0357  NA 142 142 140 141  K 6.0* 5.9* 4.8 4.4  CL 112* 112* 108 106  CO2 13* 13* 19* 20*  GLUCOSE 82 83 118* 106*  BUN 90* 93* 77* 64*  CREATININE 9.79* 9.94* 8.58* 7.49*  CALCIUM  7.6* 7.5* 7.8* 7.4*    CBC: Recent Labs  Lab 01/23/24 0527 01/24/24 0453 01/25/24 0357  WBC 9.5 7.0 7.0  NEUTROABS 7.7  --   --   HGB 8.7* 7.7* 7.5*  HCT 28.2* 24.5* 23.7*  MCV 97.9 96.5 93.3  PLT 239 202 210     Scheduled Meds:  amLODipine   10 mg Oral Daily   apixaban   2.5 mg Oral BID   calcitRIOL   0.25 mcg Oral Daily   calcium  carbonate  2 tablet Oral TID WC   epoetin  alfa-epbx (RETACRIT ) injection  20,000 Units Subcutaneous Weekly   furosemide   60 mg Intravenous Q12H   gentamicin  cream  1 Application Topical Daily   isosorbide  mononitrate  120 mg Oral Daily   metoprolol  succinate  100 mg Oral Q breakfast   pantoprazole   40 mg Oral Daily   sodium bicarbonate   1,300 mg Oral BID   sodium chloride  flush  3 mL Intravenous Q12H   sodium zirconium cyclosilicate   10 g Oral Once   Continuous Infusions:  dialysis solution 2.5%  low-MG/low-CA       Assessment & Plan:   SOB secondary to pulmonary edema secondary to missed PD ESRD on PD, missed PD Patient improved with ongoing PD However still feeling short of breath Continue PD per nephrology Patient can be discharged home once fluid status is optimized.  Patient considering stopping PD, declines transition to HD. Patient seen by palliative care who notes that he is willing to continue PD because he does not want to die.  No interested in starting HD at present. Patient is not suicidal, psych consult was discontinued.  PAF with RVR  Chest pain NSTEMI, troponin 271 today Patient seen by cardiology, troponins are chronically elevated around 250, however cardiology felt this was a type II NSTEMI secondary to increased filling  pressures. Patient completed 2 days of heparin  drip, Toprol  and Imdur  were optimized by cardiology. Patient spontaneously converted back into sinus rhythm. Now back on Eliquis  Echocardiogram showed no new wall motion abnormalities Patient is to be discharged home on his usual metoprolol , increased Imdur  and Eliquis    DVT prophylaxis: Eliquis  Code Status: DNR Family Communication: None today     Studies: No results found.   Principal Problem:   CHF (congestive heart failure) (HCC) Active Problems:   Acute pulmonary edema (HCC)   Passive suicidal ideations     [name], [facility]  If 7PM-7AM, please contact night-coverage [email or web address]   LOS: 2 days  "

## 2024-01-25 NOTE — Progress Notes (Signed)
 Heart Failure Navigator Progress Note  Assessed for Heart & Vascular TOC clinic readiness.  Patient does not meet criteria due to current [facility] patient and ESRD on PD.   Navigator will sign off at this time.  Charmaine Pines, RN, BSN [facility] Heart Failure Navigator Secure Chat Only

## 2024-01-25 NOTE — TOC CM/SW Note (Signed)
 Transition of Care [facility]) - Inpatient Brief Assessment   Patient Details  Name: DAREION KNEECE MRN: [id number] Date of Birth: 1950/12/28  Transition of Care [facility]) CM/SW Contact:    Shasta DELENA Daring, RN Phone Number: 01/25/2024, 10:21 AM   Clinical Narrative:  Transition of Care [facility]) Screening Note   Patient Details  Name: AMALIO LOE Date of Birth: 07/29/1950   Transition of Care [facility]) CM/SW Contact:    Shasta DELENA Daring, RN Phone Number: 01/25/2024, 10:21 AM    Transition of Care Department [facility]) has reviewed patient and no TOC needs have been identified at this time. We will continue to monitor patient advancement through interdisciplinary progression rounds. If new patient transition needs arise, please place a TOC consult.     Transition of Care Asessment: Insurance and Status: Insurance coverage has been reviewed Patient has primary care physician: Yes   Prior level of function:: independent Prior/Current Home Services: No current home services Social Drivers of Health Review: SDOH reviewed no interventions necessary Readmission risk has been reviewed: Yes Transition of care needs: no transition of care needs at this time

## 2024-01-25 NOTE — Progress Notes (Signed)
 " [facility]  ROUNDING NOTE   Subjective:   Casey French is a 73 y.o. male with past medical history of coronary artery disease, hypertension, hyperlipidemia, diabetes mellitus type 2, gout, GERD, ESRD on PD, anemia chronic kidney disease, secondary hyperparathyroidism who presented with chest pain and has been admitted for CHF (congestive heart failure) (HCC) [I50.9] Hypoxia [R09.02]  Patient is known to our practice and receives outpatient treatment through the [facility], supervised by Dr Douglas. He states his last treatment was completed on 12/25.   Update Patient seen laying in bed Complaints of chest discomfort, feeling unwell Denies shortness of breath  PD UF   Objective:  Vital signs in last 24 hours:  Temp:  [97.5 F (36.4 C)-98.3 F (36.8 C)] 98.3 F (36.8 C) (12/31 1116) Pulse Rate:  [68-94] 78 (12/31 1116) Resp:  [14-22] 14 (12/31 1116) BP: (126-155)/(72-87) 150/78 (12/31 1116) SpO2:  [97 %-100 %] 98 % (12/31 1116) Weight:  [57.2 kg-61.1 kg] 61.1 kg (12/31 0500)  Weight change: -2.9 kg Filed Weights   01/23/24 1900 01/24/24 2045 01/25/24 0500  Weight: 60.1 kg 57.2 kg 61.1 kg    Intake/Output: I/O last 3 completed shifts: In: -341.4 [I.V.:332.7] Out: 1025 [Urine:1025]   Intake/Output this shift:  Total I/O In: 3 [I.V.:3] Out: 496 [Urine:250; Other:246]  Physical Exam: General: NAD  Head: Normocephalic, atraumatic.   Eyes: Anicteric  Lungs:  Diminished, [place] O2  Heart: Regular rate and rhythm  Abdomen:  Soft, nontender, PDC  Extremities:  No peripheral edema.  Neurologic: Awake, alert, conversant  Skin: Warm,dry, no rash  Access: [facility]    Basic Metabolic Panel: Recent Labs  Lab 01/23/24 0527 01/23/24 1232 01/24/24 0453 01/25/24 0357  NA 142 142 140 141  K 6.0* 5.9* 4.8 4.4  CL 112* 112* 108 106  CO2 13* 13* 19* 20*  GLUCOSE 82 83 118* 106*  BUN 90* 93* 77* 64*  CREATININE 9.79* 9.94* 8.58* 7.49*   CALCIUM  7.6* 7.5* 7.8* 7.4*    Liver Function Tests: No results for input(s): AST, ALT, ALKPHOS, BILITOT, PROT, ALBUMIN in the last 168 hours. No results for input(s): LIPASE, AMYLASE in the last 168 hours. No results for input(s): AMMONIA in the last 168 hours.  CBC: Recent Labs  Lab 01/23/24 0527 01/24/24 0453 01/25/24 0357  WBC 9.5 7.0 7.0  NEUTROABS 7.7  --   --   HGB 8.7* 7.7* 7.5*  HCT 28.2* 24.5* 23.7*  MCV 97.9 96.5 93.3  PLT 239 202 210    Cardiac Enzymes: No results for input(s): CKTOTAL, CKMB, CKMBINDEX, TROPONINI in the last 168 hours.  BNP: Invalid input(s): POCBNP  CBG: No results for input(s): GLUCAP in the last 168 hours.  Microbiology: Results for orders placed or performed in visit on 01/03/24  Microscopic Examination     Status: Abnormal   Collection Time: 01/03/24  1:41 PM   Urine  Result Value Ref Range Status   WBC, UA 0-5 0 - 5 /hpf Final   RBC, Urine 11-30 (A) 0 - 2 /hpf Final   Epithelial Cells (non renal) 0-10 0 - 10 /hpf Final   Casts Present (A) None seen /lpf Final   Cast Type Granular casts (A) N/A Final   Mucus, UA Present (A) Not Estab. Final   Bacteria, UA Few None seen/Few Final    Coagulation Studies: Recent Labs    01/23/24 1232  LABPROT 16.2*  INR 1.2    Urinalysis: No results for  input(s): COLORURINE, LABSPEC, PHURINE, GLUCOSEU, HGBUR, BILIRUBINUR, KETONESUR, PROTEINUR, UROBILINOGEN, NITRITE, LEUKOCYTESUR in the last 72 hours.  Invalid input(s): APPERANCEUR    Imaging: No results found.    Medications:    dialysis solution 2.5% low-MG/low-CA      amLODipine   10 mg Oral Daily   apixaban   2.5 mg Oral BID   calcitRIOL   0.25 mcg Oral Daily   calcium  carbonate  2 tablet Oral TID WC   epoetin  alfa-epbx (RETACRIT ) injection  20,000 Units Subcutaneous Weekly   furosemide   60 mg Intravenous Q12H   gentamicin  cream  1 Application Topical Daily   isosorbide   mononitrate  120 mg Oral BID   metoprolol  succinate  100 mg Oral Q breakfast   pantoprazole   40 mg Oral Daily   sodium bicarbonate   1,300 mg Oral BID   sodium chloride  flush  3 mL Intravenous Q12H   sodium zirconium cyclosilicate   10 g Oral Once   acetaminophen , hydrALAZINE , HYDROmorphone  (DILAUDID ) injection, ondansetron  (ZOFRAN ) IV, sodium chloride  flush, zolpidem   Assessment/ Plan:  Casey French is a 73 y.o.  male with past medical history of coronary artery disease, hypertension, hyperlipidemia, diabetes mellitus type 2, gout, GERD, ESRD on PD, anemia chronic kidney disease, secondary hyperparathyroidism who presented with chest pain and has been admitted for CHF (congestive heart failure) (HCC) [I50.9] Hypoxia [R09.02]  [facility] [place]  End stage renal disease on Peritoneal dialysis. Dialysis received overnight, UF . Will continue nightly treatments as prescribed.   2. Acute respiratory failure, Pulmonary edema noted on CT angio chest. Missed multiple dialysis treatments. Remains on 2L [place].. Continue IV Furosemide  60mg  twice daily.   3. Hyperkalemia, potassium 6.0, due to lack of dialysis. Potassium corrected with dialysis and binders.   4. Anemia of chronic kidney disease Lab Results  Component Value Date   HGB 7.5 (L) 01/25/2024    Continue Retacrit  20000 units weekly    LOS: 2 [name] 12/31/20251:21 PM   "

## 2024-01-25 NOTE — Progress Notes (Signed)
 " [facility] CLINIC CARDIOLOGY PROGRESS NOTE       Patient ID: Casey French MRN: [id number] DOB/AGE: [birth year] 73 y.o.  Admit date: 01/23/2024 Referring Physician Dr. Dana Primary Physician Valora Lynwood FALCON, MD  Primary Cardiologist Therisa Pierre, PA Reason for Consultation AoCHF, elevated troponin  HPI: Casey French is a 73 y.o. male  with a past medical history of coronary artery disease s/p multiple stents most recently 2017 @ [facility], paroxysmal atrial fibrillation, hypertension, hyperlipidemia, type II diabetes, end-stage renal disease on peritoneal dialysis  who presented to the ED on 01/23/2024 for SOB and CP. Cardiology was consulted for further evaluation.   Interval history: -Patient seen and examined this AM, resting in bed.  -States he still feels SOB today and with this has some chest tightness.  -BP and HR remain stable. No events noted on tele.   Review of systems complete and found to be negative unless listed above    Past Medical History:  Diagnosis Date   Acute diverticulitis 06/14/2014   Acute ST elevation myocardial infarction (STEMI) of inferior wall (HCC) 2003   a.) PCI with Cypher stent to the RCA   Anemia of chronic renal failure    Aortic atherosclerosis    Bladder tumor    CAP (community acquired pneumonia) 06/2022   Cerebral microvascular disease    Coronary artery disease involving native coronary artery of native heart without angina pectoris 2003   a.) PCI with Cypher stent to the RCA 2003; b.) LHC 08/2003: 100% dRCA, 60% pLAD - med mgmt; c.) LHC/PCI 08/21/2015: occluded RCA at site of prior stent placement, 70% mLAD (FFR 0.79; 3.5 x 12 mm Xience DES), 50% dLAD (FFR 0.70); d.) MV 11/11/2016: no ischemia   Depression    Diverticulosis    End-stage renal disease on peritoneal dialysis (HCC)    GERD (gastroesophageal reflux disease)    GI bleed 05/15/2019   Gout    Heart murmur    Hematemesis 05/15/2019   Hepatic cyst    Hepatomegaly     Hyperlipidemia    Hypertension    Lacunar infarction (HCC) 02/11/2007   a.) CT head 02/11/2007: lacunar infarction involving thalamus and external capsule region on the RIGHT; b.) MRI brain 02/03/2020: multiple remote lacunar infarcts involving the RIGHT basal ganglia, RIGHT thalamus, and LEFT middle cerebellar peduncle   Long-term use of aspirin  therapy    Myocardial bridge    On apixaban  therapy    Pancolitis 09/2022   Paroxysmal atrial fibrillation (HCC)    a.) CHA2DS2VASc = 6 (age, HTN, CVA x 2, prior MI/vascular disease, T2DM) as of 03/11/2023; b.) rate/rhythm maintained on oral metoprolol  succinate; chronically anticoagulated with dose reduced apixaban    Renal cyst, right    Sigmoid diverticulitis 03/2022   T2DM (type 2 diabetes mellitus) (HCC)    Traumatic skull fracture and resulting subarachnoid hemorrhage 03/24/2013   a.) MOI was slipping and falling on ice   Unstable angina [facility])     Past Surgical History:  Procedure Laterality Date   BLADDER INSTILLATION N/A 04/05/2023   Procedure: BLADDER INSTILLATION OF GEMCITABINE ;  Surgeon: Twylla Glendia BROCKS, MD;  Location: [facility] ORS;  Service: Urology;  Laterality: N/A;   CAPD INSERTION N/A 08/13/2020   Procedure: LAPAROSCOPIC INSERTION CONTINUOUS AMBULATORY PERITONEAL DIALYSIS  (CAPD) CATHETER;  Surgeon: Lane Shope, MD;  Location: [facility] ORS;  Service: General;  Laterality: N/A;   CARDIAC CATHETERIZATION     CATARACT EXTRACTION W/PHACO Right 07/05/2019   Procedure: CATARACT EXTRACTION PHACO AND INTRAOCULAR  LENS PLACEMENT (IOC) RIGHT DIABETIC;  Surgeon: Jaye Fallow, MD;  Location: [facility] ORS;  Service: Ophthalmology;  Laterality: Right;  US00:59.3 CDE8.41 LOT [id number] h   COLONOSCOPY WITH PROPOFOL  N/A 04/01/2017   Procedure: COLONOSCOPY WITH PROPOFOL ;  Surgeon: Gaylyn Gladis PENNER, MD;  Location: [facility] ENDOSCOPY;  Service: Endoscopy;  Laterality: N/A;   CORONARY ANGIOPLASTY WITH STENT PLACEMENT  10/2001   RCA   CORONARY  ANGIOPLASTY WITH STENT PLACEMENT Left 08/21/2015   Procedure: CORONARY ANGIOPLASTY WITH STENT PLACEMENT (mid LAD); Location: [facility]; Surgeon: Zachary Car, MD   ESOPHAGOGASTRODUODENOSCOPY N/A 04/24/2023   Procedure: EGD (ESOPHAGOGASTRODUODENOSCOPY);  Surgeon: Toledo, Ladell POUR, MD;  Location: [facility] ENDOSCOPY;  Service: Gastroenterology;  Laterality: N/A;   HAND SURGERY  2015   3 screws in right hand   TOTAL HIP ARTHROPLASTY Left 04/22/2020   Procedure: TOTAL HIP ARTHROPLASTY ANTERIOR APPROACH;  Surgeon: Kathlynn Sharper, MD;  Location: [facility] ORS;  Service: Orthopedics;  Laterality: Left;   TRANSURETHRAL RESECTION OF BLADDER TUMOR N/A 04/05/2023   Procedure: TURBT (TRANSURETHRAL RESECTION OF BLADDER TUMOR);  Surgeon: Twylla Glendia BROCKS, MD;  Location: [facility] ORS;  Service: Urology;  Laterality: N/A;    Medications Prior to Admission  Medication Sig Dispense Refill Last Dose/Taking   acetaminophen  (TYLENOL ) 325 MG tablet Take 2 tablets (650 mg total) by mouth every 6 (six) hours as needed for mild pain (or Fever >/= 101).   Unknown   amLODipine  (NORVASC ) 10 MG tablet Take 10 mg by mouth daily.   Unknown   apixaban  (ELIQUIS ) 2.5 MG TABS tablet Take 2.5 mg by mouth 2 (two) times daily.   Unknown   calcitRIOL  (ROCALTROL ) 0.25 MCG capsule Take 1 capsule (0.25 mcg total) by mouth daily. 30 capsule 0 Unknown   calcium  carbonate (TUMS) 500 MG chewable tablet Chew 2 tablets by mouth 3 (three) times daily with meals.   Unknown   cyanocobalamin  (VITAMIN B12) 500 MCG tablet Take 1 tablet (500 mcg total) by mouth daily. 30 tablet 0 Unknown   folic acid  (FOLVITE ) 1 MG tablet Take 1 tablet (1 mg total) by mouth daily. 30 tablet 0 Unknown   furosemide  (LASIX ) 80 MG tablet Take 80 mg by mouth daily.   Unknown   gabapentin  (NEURONTIN ) 300 MG capsule Take 300 mg by mouth at bedtime.  11 Unknown   hydrocortisone  (ANUSOL -HC) 2.5 % rectal cream Place 1 application rectally 2 (two) times daily as needed for hemorrhoids or anal  itching.   Unknown   metoprolol  succinate (TOPROL -XL) 100 MG 24 hr tablet Take 1 tablet (100 mg total) by mouth daily with breakfast. Take with or immediately following a meal. 30 tablet 0 Unknown   nitroGLYCERIN  (NITROSTAT ) 0.4 MG SL tablet Place 0.4 mg under the tongue every 5 (five) minutes as needed for chest pain.   Unknown   omeprazole 20 MG TBDD disintegrating tablet Take 20 mg by mouth daily.   Unknown   ondansetron  (ZOFRAN ) 4 MG tablet Take 4 mg by mouth every 8 (eight) hours as needed.   Unknown   pantoprazole  (PROTONIX ) 40 MG tablet Take 1 tablet (40 mg total) by mouth daily. 30 tablet 1 Unknown   promethazine  (PHENERGAN ) 25 MG tablet Take 1 tablet (25 mg total) by mouth every 8 (eight) hours as needed for refractory nausea / vomiting. 20 tablet 0 Unknown   sevelamer  carbonate (RENVELA ) 800 MG tablet Take 800 mg by mouth 3 (three) times daily with meals.   Unknown   sodium bicarbonate  650 MG tablet Take 2 tablets (1,300  mg total) by mouth 2 (two) times daily. 120 tablet 0 Unknown   acetaminophen  (TYLENOL ) 325 MG tablet Take 2 tablets (650 mg total) by mouth every 6 (six) hours as needed for mild pain (pain score 1-3) or fever (or Fever >/= 101).      escitalopram  (LEXAPRO ) 10 MG tablet Take 10 mg by mouth daily. For 360 days (Patient not taking: Reported on 01/23/2024)   Not Taking   furosemide  (LASIX ) 20 MG tablet Take 20 mg by mouth daily. (Patient not taking: Reported on 01/23/2024)   Not Taking   isosorbide  mononitrate (IMDUR ) 30 MG 24 hr tablet Take 1 tablet (30 mg total) by mouth daily as needed (Systolic BP greater than 140). 30 tablet 1    Social History   Socioeconomic History   Marital status: Married    Spouse name: Sherri   Number of children: 2   Years of education: Not on file   Highest education level: Not on file  Occupational History   Not on file  Tobacco Use   Smoking status: Former    Current packs/day: 0.00    Types: Cigarettes    Quit date: 03/24/2001     Years since quitting: 22.8   Smokeless tobacco: Never  Vaping Use   Vaping status: Never Used  Substance and Sexual Activity   Alcohol use: No    Alcohol/week: 0.0 standard drinks of alcohol   Drug use: No   Sexual activity: Not on file  Other Topics Concern   Not on file  Social History Narrative   Lives with wife   Social Drivers of Health   Tobacco Use: Medium Risk (01/12/2024)   [facility]   Patient History    Smoking Tobacco Use: Former    Smokeless Tobacco Use: Never    Passive Exposure: Not on file  Financial Resource Strain: Medium Risk (11/28/2023)   Received from [facility]   Overall Financial Resource Strain (CARDIA)    Difficulty of Paying Living Expenses: Somewhat hard  Food Insecurity: Food Insecurity Present (01/24/2024)   Epic    Worried About Programme Researcher, Broadcasting/film/video in the Last Year: Sometimes true    Ran Out of Food in the Last Year: Never true  Transportation Needs: No Transportation Needs (01/24/2024)   Epic    Lack of Transportation (Medical): No    Lack of Transportation (Non-Medical): No  Physical Activity: Not on file  Stress: Not on file  Social Connections: Socially Isolated (01/24/2024)   Social Connection and Isolation Panel    Frequency of Communication with Friends and Family: Once a week    Frequency of Social Gatherings with Friends and Family: Never    Attends Religious Services: Never    Database Administrator or Organizations: No    Attends Banker Meetings: Never    Marital Status: Married  Catering Manager Violence: Not At Risk (01/24/2024)   Epic    Fear of Current or Ex-Partner: No    Emotionally Abused: No    Physically Abused: No    Sexually Abused: No  Depression (PHQ2-9): Not on file  Alcohol Screen: Not on file  Housing: Low Risk (01/24/2024)   Epic    Unable to Pay for Housing in the Last Year: No    Number of Times Moved in the Last Year: 0    Homeless in  the Last Year: No  Utilities: Not At Risk (01/24/2024)   Epic  Threatened with loss of utilities: No  Health Literacy: Not on file    Family History  Problem Relation Age of Onset   Heart disease Mother    Heart disease Father    CAD Other      Vitals:   01/25/24 0500 01/25/24 0704 01/25/24 1014 01/25/24 1116  BP:  (!) 150/74 (!) 155/84 (!) 150/78  Pulse:  94 84 78  Resp:    14  Temp:    98.3 F (36.8 C)  TempSrc:      SpO2:  97%  98%  Weight: 61.1 kg     Height:        PHYSICAL EXAM General: Chronically ill-appearing male, well nourished, in no acute distress. HEENT: Normocephalic and atraumatic. Neck: No JVD.  Lungs: Normal respiratory effort on 2L [place]. Clear bilaterally to auscultation. No wheezes, crackles, rhonchi.  Heart: HRRR. Normal S1 and S2 without gallops or murmurs.  Abdomen: Non-distended appearing.  Msk: Normal strength and tone for age. Extremities: Warm and well perfused. No clubbing, cyanosis.  No edema.  Neuro: Alert and oriented X 3. Psych: Answers questions appropriately.   Labs: Basic Metabolic Panel: Recent Labs    01/24/24 0453 01/25/24 0357  NA 140 141  K 4.8 4.4  CL 108 106  CO2 19* 20*  GLUCOSE 118* 106*  BUN 77* 64*  CREATININE 8.58* 7.49*  CALCIUM  7.8* 7.4*   Liver Function Tests: No results for input(s): AST, ALT, ALKPHOS, BILITOT, PROT, ALBUMIN in the last 72 hours. No results for input(s): LIPASE, AMYLASE in the last 72 hours. CBC: Recent Labs    01/23/24 0527 01/24/24 0453 01/25/24 0357  WBC 9.5 7.0 7.0  NEUTROABS 7.7  --   --   HGB 8.7* 7.7* 7.5*  HCT 28.2* 24.5* 23.7*  MCV 97.9 96.5 93.3  PLT 239 202 210   Cardiac Enzymes: No results for input(s): CKTOTAL, CKMB, CKMBINDEX, TROPONINIHS in the last 72 hours. BNP: No results for input(s): BNP in the last 72 hours. D-Dimer: No results for input(s): DDIMER in the last 72 hours. Hemoglobin A1C: No results for input(s): HGBA1C in the  last 72 hours. Fasting Lipid Panel: No results for input(s): CHOL, HDL, LDLCALC, TRIG, CHOLHDL, LDLDIRECT in the last 72 hours. Thyroid  Function Tests: No results for input(s): TSH, T4TOTAL, T3FREE, THYROIDAB in the last 72 hours.  Invalid input(s): FREET3 Anemia Panel: No results for input(s): VITAMINB12, FOLATE, FERRITIN, TIBC, IRON , RETICCTPCT in the last 72 hours.   Radiology: CT Angio Chest PE W and/or Wo Contrast Result Date: 01/23/2024 EXAM: CTA CHEST 01/23/2024 07:59:59 AM TECHNIQUE: CTA of the chest was performed without and with the administration of 75 mL of intravenous iohexol  (OMNIPAQUE ) 350 MG/ML injection. Multiplanar reformatted images are provided for review. MIP images are provided for review. Automated exposure control, iterative reconstruction, and/or weight based adjustment of the mA/kV was utilized to reduce the radiation dose to as low as reasonably achievable. COMPARISON: 04/22/2023 aortic atherosclerotic calcification. Coronary artery calcifications. CLINICAL HISTORY: hypoxia FINDINGS: PULMONARY ARTERIES: Pulmonary arteries are adequately opacified for evaluation. No acute pulmonary embolus. Main pulmonary artery is normal in caliber. MEDIASTINUM: Coronary artery calcifications. The pericardium demonstrates no acute abnormality. Aortic atherosclerotic calcification. There is no acute abnormality of the thoracic aorta. LYMPH NODES: Right hilar lymph node has increased in size, measuring 1.3 cm. No mediastinal or axillary lymphadenopathy. LUNGS AND PLEURA: Diffuse interlobular septal thickening is identified along with ground glass attenuation, most likely representing pulmonary edema. Band-like area of consolidative change  within the posterior and anterior basal right lower lobe compatible with atelectasis and/or pneumonia. Consolidative change within the posterior left base. Emphysema. Small right pleural effusion. No pneumothorax. UPPER ABDOMEN:  Perihepatic and perisplenic ascites noted. SOFT TISSUES AND BONES: No acute or suspicious osseous findings. No acute soft tissue abnormality. IMPRESSION: 1. Diffuse interlobular septal thickening and ground glass attenuation, most likely representing pulmonary edema. 2. Small right pleural effusion. 3. Band-like area of consolidative change within the posterior and anterior basal right lower lobe compatible with atelectasis and/or pneumonia. 4. Consolidative change within the posterior left base, pneumonia versus aspiration vs asymmetric edema. Electronically signed by: Waddell Calk MD 01/23/2024 08:16 AM EST RP Workstation: [id number]   DG Chest 1 View Result Date: 01/23/2024 EXAM: 1 VIEW(S) XRAY OF THE CHEST 01/23/2024 05:35:00 AM COMPARISON: 12/21/2023 CLINICAL HISTORY: 73 year old male with chest pain. FINDINGS: LUNGS AND PLEURA: Pulmonary interstitial prominence, no overt pulmonary edema. Hazy opacity at right lung base, possible airspace consolidation or atelectasis. Small right pleural effusion. No pneumothorax. Mildly lower lung volumes. HEART AND MEDIASTINUM: Cardiomegaly. Calcified aorta. BONES AND SOFT TISSUES: No acute osseous abnormality. Negative visible bowel gas. IMPRESSION: 1. Lower lung volumes with Right basilar opacity, atelectasis vs infection, probable small effusion. Electronically signed by: Helayne Hurst MD 01/23/2024 05:48 AM EST RP Workstation: [id number]    ECHO 12/22/2023: 1. Left ventricular ejection fraction, by estimation, is 60 to 65%. The left ventricle has normal function. The left ventricle has no regional wall motion abnormalities. Left ventricular diastolic parameters are consistent with Grade II diastolic dysfunction (pseudonormalization).   2. Right ventricular systolic function is normal. The right ventricular  size is normal.   3. The mitral valve is normal in structure. Mild mitral valve  regurgitation. No evidence of mitral stenosis.   4. The aortic valve is  normal in structure. Aortic valve regurgitation is  not visualized. Aortic valve sclerosis/calcification is present, without  any evidence of aortic stenosis.   5. The inferior vena cava is normal in size with greater than 50%  respiratory variability, suggesting right atrial pressure of 3 mmHg.   Nuclear stress test 01/05/2024: FINDINGS:  Regional wall motion:  reveals normal myocardial thickening and wall  motion.  The overall quality of the study is good.   Artifacts noted: no  Left ventricular cavity: normal.   Perfusion Analysis:  SPECT images demonstrate small perfusion abnormality  of mild intensity is present in the inferior region on the stress images.  Defect type : Reversible     TELEMETRY (personally reviewed): Sinus rhythm rate 80s  EKG (personally reviewed): AF RVR rate 138 bpm   Data reviewed by me 01/25/2024: last 24h vitals tele labs imaging I/O ED provider note, admission H&P, hospitalist progress note  Principal Problem:   CHF (congestive heart failure) (HCC) Active Problems:   Acute pulmonary edema (HCC)   Passive suicidal ideations    ASSESSMENT AND PLAN:  EVANDER MACARAEG is a 73 y.o. male  with a past medical history of coronary artery disease s/p multiple stents most recently 2017 @ [facility], paroxysmal atrial fibrillation, hypertension, hyperlipidemia, type II diabetes, end-stage renal disease on peritoneal dialysis  who presented to the ED on 01/23/2024 for SOB and CP. Cardiology was consulted for further evaluation.   # Acute on chronic HFpEF  # Atrial fibrillation RVR # Paroxysmal atrial fibrillation # Coronary artery disease # Demand ischemia Patient presented for evaluation of SOB, CP after skipping two PD treatments. Started on BiPAP and IV lasix  in the  ED and underwent PD. Has had some UOP.  Echo last admission with preserved EF. - Continue IV Lasix  60 mg twice daily.  Nephrology following, appreciate their recommendations. - Increase Imdur  to  120 mg twice daily.Continue amlodipine  10 mg daily (he can titrate dose down if BP becomes soft with increase in Imdur  dose) and continue metoprolol  succinate 100 mg daily. - Continue IV heparin  until 48 hours have been completed (this afternoon).  Then we will plan to transition back to home Eliquis  2.5 mg twice daily (weight and renal function).  -Mild and flat troponins most consistent with demand/supply mismatch and not ACS in the setting of acute heart failure after missed dialysis sessions.  No further cardiac diagnostics recommended at this time. - Agree with palliative and psychiatry evaluations.    This patient's plan of care was discussed and created with Dr. Wilburn and he is in agreement.  Signed: Danita Bloch, PA-C  01/25/2024, 11:23 AM [facility] Cardiology      "

## 2024-01-25 NOTE — Progress Notes (Signed)
" °   01/25/24 0724  Peritoneal Catheter Left lower abdomen Continuous ambulatory  Placement Date/Time: 08/13/20 1246   Procedural Verification: Medical records & consent reviewed  Person Inserting LDA: Dr Derrill  Catheter Location: Left lower abdomen  Catheter Conversion: Extension added  Serial / Lot #: [id number]  Dialysis Type: C...  Site Assessment Clean, Dry, Intact;Clean  Drainage Description None  Catheter status Deaccessed;Patent;Capped  Dressing Gauze/Drain sponge;Occlusive  Dressing Status Clean, Dry, Intact;Clean;Dry  Dressing Intervention Assessed, no intervention needed  Completion  Treatment Status Complete  Initial Drain Volume 32  Average Dwell Time-Hour(s) 1  Average Dwell Time-Min(s) 30  Average Drain Time 37  Total Therapy Volume 89999  Total Therapy Time-Hour(s) 9  Total Therapy Time-Min(s) 30  Weight after Drain 130 lb 11.7 oz (59.3 kg)  Effluent Appearance Clear;Yellow  Fluid Balance - CCPD  Total UF (+ value on cycler, pt loss) 246 mL  Procedure Comments  Tolerated treatment well? Yes  Peritoneal Dialysis Comments Pt tolerated well. No verbalized concerns. removed.  Education / Care Plan  Dialysis Education Provided Yes  Hand-off documentation  Hand-off Given Given to shift RN/LPN  Report given to (Full Name) Delon Hock RN  Hand-off Received Received from shift RN/LPN  Report received from (Full Name) Joane Manifold RN    "

## 2024-01-25 NOTE — Progress Notes (Addendum)
 PHARMACY - ANTICOAGULATION CONSULT NOTE  Pharmacy Consult for Heparin  Infusion Indication: chest pain/ACS  Allergies[1]  Patient Measurements: Weight: 60.1 kg (132 lb 7.9 oz)  Vital Signs: Temp: 97.5 F (36.4 C) (12/31 0433) Temp Source: Oral (12/30 2315) BP: 152/78 (12/31 0433) Pulse Rate: 87 (12/31 0433)  Labs: Recent Labs    01/23/24 0527 01/23/24 1232 01/23/24 1914 01/24/24 0453 01/24/24 1352 01/24/24 2356  HGB 8.7*  --   --  7.7*  --   --   HCT 28.2*  --   --  24.5*  --   --   PLT 239  --   --  202  --   --   APTT  --  41*  --   --   --   --   LABPROT  --  16.2*  --   --   --   --   INR  --  1.2  --   --   --   --   HEPARINUNFRC  --  0.13*   < > 0.30 0.26* 0.30  CREATININE 9.79* 9.94*  --  8.58*  --   --    < > = values in this interval not displayed.    Estimated Creatinine Clearance: 6.6 mL/min (A) (by C-G formula based on SCr of 8.58 mg/dL (H)).   Medical History: Past Medical History:  Diagnosis Date   Acute diverticulitis 06/14/2014   Acute ST elevation myocardial infarction (STEMI) of inferior wall (HCC) 2003   a.) PCI with Cypher stent to the RCA   Anemia of chronic renal failure    Aortic atherosclerosis    Bladder tumor    CAP (community acquired pneumonia) 06/2022   Cerebral microvascular disease    Coronary artery disease involving native coronary artery of native heart without angina pectoris 2003   a.) PCI with Cypher stent to the RCA 2003; b.) LHC 08/2003: 100% dRCA, 60% pLAD - med mgmt; c.) LHC/PCI 08/21/2015: occluded RCA at site of prior stent placement, 70% mLAD (FFR 0.79; 3.5 x 12 mm Xience DES), 50% dLAD (FFR 0.70); d.) MV 11/11/2016: no ischemia   Depression    Diverticulosis    End-stage renal disease on peritoneal dialysis (HCC)    GERD (gastroesophageal reflux disease)    GI bleed 05/15/2019   Gout    Heart murmur    Hematemesis 05/15/2019   Hepatic cyst    Hepatomegaly    Hyperlipidemia    Hypertension    Lacunar  infarction (HCC) 02/11/2007   a.) CT head 02/11/2007: lacunar infarction involving thalamus and external capsule region on the RIGHT; b.) MRI brain 02/03/2020: multiple remote lacunar infarcts involving the RIGHT basal ganglia, RIGHT thalamus, and LEFT middle cerebellar peduncle   Long-term use of aspirin  therapy    Myocardial bridge    On apixaban  therapy    Pancolitis 09/2022   Paroxysmal atrial fibrillation (HCC)    a.) CHA2DS2VASc = 6 (age, HTN, CVA x 2, prior MI/vascular disease, T2DM) as of 03/11/2023; b.) rate/rhythm maintained on oral metoprolol  succinate; chronically anticoagulated with dose reduced apixaban    Renal cyst, right    Sigmoid diverticulitis 03/2022   T2DM (type 2 diabetes mellitus) (HCC)    Traumatic skull fracture and resulting subarachnoid hemorrhage 03/24/2013   a.) MOI was slipping and falling on ice   Unstable angina (HCC)     Medications:  Per chart review, it appears patient takes apixaban  2.5 mg daily- med rec pending  Assessment: Patient is a 73 year old  male with a past medical history of ESRD on PD MWF, PAF on Eliquis , HTN/chronic HFpEF, HLD, CAD, IIDM, presented with worsening of chest pain and shortness of breath. Tropon trending [id number]. Per provider H&P: Pattern is flat implying demand ischemia secondary to CHF decompensation.  However there was a recent stress test done 2 weeks ago which showed small reversible inferior wall defect of mild intensity consistent with mild ischemia.  Cardiology reviewed patient past stress test which showed similar inferior reversible ischemia as a stress test in 2023.  Cardiology recommended 48 hours of heparin  drip. Pharmacy has been consulted to start patient on a heparin  infusion.  Baseline INR 1.2, aPTT 41, and heparin  level 0.13. No signs/symptoms of bleeding noted in chart.  12/30:  HL @ 0453 = 0.30, therapeutic X 1  12/30: HL @ 1352 = 0.26, subtherapeutic  12/30: HL @ 2356 = 0.30, therapeutic X 1   Goal of  Therapy:  Heparin  level 0.3-0.7 units/ml aPTT 66-102 seconds Monitor platelets by anticoagulation protocol: Yes   Plan:  12/31:  RN messaged to state pt lost IV access @ ~ 0400, heparin  drip off since then.  - HL ordered for 0800 cancelled , will need to f/u with RN regarding restarting heparin  - order HL 8 hrs after restart , consider rebolus  12/31:  Heparin  gtt restarted around 0530 ,  heparin  was off for about 1.5 hrs  - will retime next HL for 8 hrs after restart @ 1300 - Monitor CBC daily while on heparin   Johnavon Mcclafferty D, PharmD 01/25/2024,4:51 AM           [1]  Allergies Allergen Reactions   Morphine  Anxiety    agitation

## 2024-01-25 NOTE — Progress Notes (Signed)
 PHARMACY - ANTICOAGULATION CONSULT NOTE  Pharmacy Consult for Heparin  Infusion Indication: chest pain/ACS  Allergies[1]  Patient Measurements: Weight: 60.1 kg (132 lb 7.9 oz)  Vital Signs: Temp: 98.2 F (36.8 C) (12/30 2351) Temp Source: Oral (12/30 2315) BP: 142/87 (12/30 2351) Pulse Rate: 91 (12/30 2351)  Labs: Recent Labs    01/23/24 0527 01/23/24 1232 01/23/24 1914 01/24/24 0453 01/24/24 1352 01/24/24 2356  HGB 8.7*  --   --  7.7*  --   --   HCT 28.2*  --   --  24.5*  --   --   PLT 239  --   --  202  --   --   APTT  --  41*  --   --   --   --   LABPROT  --  16.2*  --   --   --   --   INR  --  1.2  --   --   --   --   HEPARINUNFRC  --  0.13*   < > 0.30 0.26* 0.30  CREATININE 9.79* 9.94*  --  8.58*  --   --    < > = values in this interval not displayed.    Estimated Creatinine Clearance: 6.6 mL/min (A) (by C-G formula based on SCr of 8.58 mg/dL (H)).   Medical History: Past Medical History:  Diagnosis Date   Acute diverticulitis 06/14/2014   Acute ST elevation myocardial infarction (STEMI) of inferior wall (HCC) 2003   a.) PCI with Cypher stent to the RCA   Anemia of chronic renal failure    Aortic atherosclerosis    Bladder tumor    CAP (community acquired pneumonia) 06/2022   Cerebral microvascular disease    Coronary artery disease involving native coronary artery of native heart without angina pectoris 2003   a.) PCI with Cypher stent to the RCA 2003; b.) LHC 08/2003: 100% dRCA, 60% pLAD - med mgmt; c.) LHC/PCI 08/21/2015: occluded RCA at site of prior stent placement, 70% mLAD (FFR 0.79; 3.5 x 12 mm Xience DES), 50% dLAD (FFR 0.70); d.) MV 11/11/2016: no ischemia   Depression    Diverticulosis    End-stage renal disease on peritoneal dialysis (HCC)    GERD (gastroesophageal reflux disease)    GI bleed 05/15/2019   Gout    Heart murmur    Hematemesis 05/15/2019   Hepatic cyst    Hepatomegaly    Hyperlipidemia    Hypertension    Lacunar  infarction (HCC) 02/11/2007   a.) CT head 02/11/2007: lacunar infarction involving thalamus and external capsule region on the RIGHT; b.) MRI brain 02/03/2020: multiple remote lacunar infarcts involving the RIGHT basal ganglia, RIGHT thalamus, and LEFT middle cerebellar peduncle   Long-term use of aspirin  therapy    Myocardial bridge    On apixaban  therapy    Pancolitis 09/2022   Paroxysmal atrial fibrillation (HCC)    a.) CHA2DS2VASc = 6 (age, HTN, CVA x 2, prior MI/vascular disease, T2DM) as of 03/11/2023; b.) rate/rhythm maintained on oral metoprolol  succinate; chronically anticoagulated with dose reduced apixaban    Renal cyst, right    Sigmoid diverticulitis 03/2022   T2DM (type 2 diabetes mellitus) (HCC)    Traumatic skull fracture and resulting subarachnoid hemorrhage 03/24/2013   a.) MOI was slipping and falling on ice   Unstable angina (HCC)     Medications:  Per chart review, it appears patient takes apixaban  2.5 mg daily- med rec pending  Assessment: Patient is a 73 year old  male with a past medical history of ESRD on PD MWF, PAF on Eliquis , HTN/chronic HFpEF, HLD, CAD, IIDM, presented with worsening of chest pain and shortness of breath. Tropon trending [id number]. Per provider H&P: Pattern is flat implying demand ischemia secondary to CHF decompensation.  However there was a recent stress test done 2 weeks ago which showed small reversible inferior wall defect of mild intensity consistent with mild ischemia.  Cardiology reviewed patient past stress test which showed similar inferior reversible ischemia as a stress test in 2023.  Cardiology recommended 48 hours of heparin  drip. Pharmacy has been consulted to start patient on a heparin  infusion.  Baseline INR 1.2, aPTT 41, and heparin  level 0.13. No signs/symptoms of bleeding noted in chart.  12/30:  HL @ 0453 = 0.30, therapeutic X 1  12/30: HL @ 1352 = 0.26, subtherapeutic  12/30: HL @ 2356 = 0.30, therapeutic X 1   Goal of  Therapy:  Heparin  level 0.3-0.7 units/ml aPTT 66-102 seconds Monitor platelets by anticoagulation protocol: Yes   Plan:  12/30:  HL @ 2356 = 0.30, therapeutic X 1  - will continue pt on current rate and recheck heparin  level in 8 hours - Monitor CBC daily while on heparin   Brianne Maina D, PharmD 01/25/2024,12:32 AM          [1]  Allergies Allergen Reactions   Morphine  Anxiety    agitation

## 2024-01-26 DIAGNOSIS — Z992 Dependence on renal dialysis: Secondary | ICD-10-CM | POA: Diagnosis not present

## 2024-01-26 DIAGNOSIS — R0902 Hypoxemia: Secondary | ICD-10-CM | POA: Diagnosis not present

## 2024-01-26 DIAGNOSIS — N186 End stage renal disease: Secondary | ICD-10-CM

## 2024-01-26 DIAGNOSIS — I5021 Acute systolic (congestive) heart failure: Secondary | ICD-10-CM

## 2024-01-26 DIAGNOSIS — I5020 Unspecified systolic (congestive) heart failure: Secondary | ICD-10-CM | POA: Diagnosis not present

## 2024-01-26 LAB — CBC WITH DIFFERENTIAL/PLATELET
Abs Immature Granulocytes: 0.03 K/uL (ref 0.00–0.07)
Basophils Absolute: 0.1 K/uL (ref 0.0–0.1)
Basophils Relative: 1 %
Eosinophils Absolute: 0.1 K/uL (ref 0.0–0.5)
Eosinophils Relative: 1 %
HCT: 27 % — ABNORMAL LOW (ref 39.0–52.0)
Hemoglobin: 8.5 g/dL — ABNORMAL LOW (ref 13.0–17.0)
Immature Granulocytes: 0 %
Lymphocytes Relative: 11 %
Lymphs Abs: 1 K/uL (ref 0.7–4.0)
MCH: 30 pg (ref 26.0–34.0)
MCHC: 31.5 g/dL (ref 30.0–36.0)
MCV: 95.4 fL (ref 80.0–100.0)
Monocytes Absolute: 0.9 K/uL (ref 0.1–1.0)
Monocytes Relative: 10 %
Neutro Abs: 6.9 K/uL (ref 1.7–7.7)
Neutrophils Relative %: 77 %
Platelets: 248 K/uL (ref 150–400)
RBC: 2.83 MIL/uL — ABNORMAL LOW (ref 4.22–5.81)
RDW: 16.8 % — ABNORMAL HIGH (ref 11.5–15.5)
WBC: 9 K/uL (ref 4.0–10.5)
nRBC: 0 % (ref 0.0–0.2)

## 2024-01-26 LAB — BASIC METABOLIC PANEL WITH GFR
Anion gap: 17 — ABNORMAL HIGH (ref 5–15)
BUN: 59 mg/dL — ABNORMAL HIGH (ref 8–23)
CO2: 19 mmol/L — ABNORMAL LOW (ref 22–32)
Calcium: 7.5 mg/dL — ABNORMAL LOW (ref 8.9–10.3)
Chloride: 102 mmol/L (ref 98–111)
Creatinine, Ser: 7.12 mg/dL — ABNORMAL HIGH (ref 0.61–1.24)
GFR, Estimated: 8 mL/min — ABNORMAL LOW
Glucose, Bld: 182 mg/dL — ABNORMAL HIGH (ref 70–99)
Potassium: 4.1 mmol/L (ref 3.5–5.1)
Sodium: 138 mmol/L (ref 135–145)

## 2024-01-26 LAB — TROPONIN T, HIGH SENSITIVITY
Troponin T High Sensitivity: 220 ng/L (ref 0–19)
Troponin T High Sensitivity: 212 ng/L (ref 0–19)

## 2024-01-26 LAB — BLOOD GAS, VENOUS
Acid-base deficit: 2.8 mmol/L — ABNORMAL HIGH (ref 0.0–2.0)
Bicarbonate: 21.9 mmol/L (ref 20.0–28.0)
O2 Saturation: 95.1 %
Patient temperature: 37
pCO2, Ven: 37 mmHg — ABNORMAL LOW (ref 44–60)
pH, Ven: 7.38 (ref 7.25–7.43)
pO2, Ven: 71 mmHg — ABNORMAL HIGH (ref 32–45)

## 2024-01-26 LAB — GLUCOSE, CAPILLARY: Glucose-Capillary: 157 mg/dL — ABNORMAL HIGH (ref 70–99)

## 2024-01-26 LAB — APTT: aPTT: 61 s — ABNORMAL HIGH (ref 24–36)

## 2024-01-26 MED ORDER — FUROSEMIDE 10 MG/ML IJ SOLN: 40.0000 mg | INTRAMUSCULAR | Status: AC

## 2024-01-26 MED ORDER — NITROGLYCERIN 2 % TD OINT
0.5000 [in_us] | TOPICAL_OINTMENT | Freq: Once | TRANSDERMAL | Status: AC
  Administered 2024-01-26: 0.5 [in_us] via TOPICAL
  Filled 2024-01-26: qty 1

## 2024-01-26 MED ORDER — FUROSEMIDE 10 MG/ML IJ SOLN
INTRAMUSCULAR | Status: AC
  Administered 2024-01-26: 40 mg via INTRAVENOUS
  Filled 2024-01-26: qty 4

## 2024-01-26 MED ORDER — HEPARIN BOLUS VIA INFUSION
3000.0000 [IU] | Freq: Once | INTRAVENOUS | Status: AC
  Administered 2024-01-26: 3000 [IU] via INTRAVENOUS
  Filled 2024-01-26: qty 3000

## 2024-01-26 MED ORDER — PROCHLORPERAZINE EDISYLATE 10 MG/2ML IJ SOLN
10.0000 mg | Freq: Once | INTRAMUSCULAR | Status: AC
  Administered 2024-01-26: 10 mg via INTRAVENOUS
  Filled 2024-01-26: qty 2

## 2024-01-26 MED ORDER — METOPROLOL TARTRATE 50 MG PO TABS
75.0000 mg | ORAL_TABLET | Freq: Two times a day (BID) | ORAL | Status: DC
  Administered 2024-01-26 – 2024-01-27 (×2): 75 mg via ORAL
  Filled 2024-01-26 (×2): qty 1

## 2024-01-26 MED ORDER — HEPARIN BOLUS VIA INFUSION
850.0000 [IU] | Freq: Once | INTRAVENOUS | Status: AC
  Administered 2024-01-26: 850 [IU] via INTRAVENOUS
  Filled 2024-01-26: qty 850

## 2024-01-26 MED ORDER — HEPARIN (PORCINE) 25000 UT/250ML-% IV SOLN
850.0000 [IU]/h | INTRAVENOUS | Status: DC
  Administered 2024-01-26: 650 [IU]/h via INTRAVENOUS
  Administered 2024-01-26: 750 [IU]/h via INTRAVENOUS
  Filled 2024-01-26: qty 250

## 2024-01-26 MED ORDER — ASPIRIN 81 MG PO TBEC
81.0000 mg | DELAYED_RELEASE_TABLET | Freq: Every day | ORAL | Status: DC
  Administered 2024-01-26 – 2024-01-27 (×2): 81 mg via ORAL
  Filled 2024-01-26 (×2): qty 1

## 2024-01-26 NOTE — Progress Notes (Signed)
"  °  Peritoneal Dialysis Treatment Initiation Note     Pre Treatment Weight: 56.3 Kg   Consent signed and in chart.  PD treatment initiated via aseptic technique.    Patient is awake and alert. No complaints of pain.    PD exit site clean, dry and intact.  Gentamicin  and new dressing applied.    Hand-off given to the patient's nurse.  Education provided to dept staff  regarding PD machine and how  to contact tech support if machine  alarms.    Ozell Jubilee RN [facility] "

## 2024-01-26 NOTE — Progress Notes (Signed)
 " Daily Progress Note   Date: 01/26/2024   Patient Name: Casey French  DOB: November 16, 1950  MRN: [id number]  Age / Sex: 74 y.o., male  Attending Physician: Lenon Marien CROME, MD Primary Care Physician: Valora Lynwood FALCON, MD Admit Date: 01/23/2024 Length of Stay: 3 days  Reason for Follow-up: Establishing goals of care  Past Medical History:  Diagnosis Date   Acute diverticulitis 06/14/2014   Acute ST elevation myocardial infarction (STEMI) of inferior wall (HCC) 2003   a.) PCI with Cypher stent to the RCA   Anemia of chronic renal failure    Aortic atherosclerosis    Bladder tumor    CAP (community acquired pneumonia) 06/2022   Cerebral microvascular disease    Coronary artery disease involving native coronary artery of native heart without angina pectoris 2003   a.) PCI with Cypher stent to the RCA 2003; b.) LHC 08/2003: 100% dRCA, 60% pLAD - med mgmt; c.) LHC/PCI 08/21/2015: occluded RCA at site of prior stent placement, 70% mLAD (FFR 0.79; 3.5 x 12 mm Xience DES), 50% dLAD (FFR 0.70); d.) MV 11/11/2016: no ischemia   Depression    Diverticulosis    End-stage renal disease on peritoneal dialysis (HCC)    GERD (gastroesophageal reflux disease)    GI bleed 05/15/2019   Gout    Heart murmur    Hematemesis 05/15/2019   Hepatic cyst    Hepatomegaly    Hyperlipidemia    Hypertension    Lacunar infarction (HCC) 02/11/2007   a.) CT head 02/11/2007: lacunar infarction involving thalamus and external capsule region on the RIGHT; b.) MRI brain 02/03/2020: multiple remote lacunar infarcts involving the RIGHT basal ganglia, RIGHT thalamus, and LEFT middle cerebellar peduncle   Long-term use of aspirin  therapy    Myocardial bridge    On apixaban  therapy    Pancolitis 09/2022   Paroxysmal atrial fibrillation (HCC)    a.) CHA2DS2VASc = 6 (age, HTN, CVA x 2, prior MI/vascular disease, T2DM) as of 03/11/2023; b.) rate/rhythm maintained on oral metoprolol  succinate; chronically  anticoagulated with dose reduced apixaban    Renal cyst, right    Sigmoid diverticulitis 03/2022   T2DM (type 2 diabetes mellitus) (HCC)    Traumatic skull fracture and resulting subarachnoid hemorrhage 03/24/2013   a.) MOI was slipping and falling on ice   Unstable angina (HCC)     Subjective:   Subjective: Chart Reviewed. Updates received. Patient Assessed. Created space and opportunity for patient  and family to explore thoughts and feelings regarding current medical situation.  Today's Discussion: Today before meeting with the patient/family, I reviewed the chart notes including nursing note from yesterday, cardiology note from yesterday, TOC note from yesterday, hospitalist note from yesterday, psych note from yesterday, multiple nursing and hospitalist notes from early morning related to rapid response event, hospitalist note from today, cardiology note from today. I also reviewed vital signs, nursing flowsheets, medication administrations record, labs, and imaging. Labs reviewed include BMP which shows progressive improvement in BUN/creatinine now down to 59/7.12 in the setting of ongoing episodes of peritoneal dialysis.  Troponins remain elevated but flat, slightly improving given cardiac disease and chest pain overnight with troponins trending from 271 to 255 to 220 to 212 all from overnight and today.  Overall cardiology is questioning whether to proceed with cardiac cath for possible evaluation of recurrent episodes of chest pain/dyspnea responsive to medications.  However, there is a risk of worsening creatinine and kidney function.  The patient could end up requiring hemodialysis, which  he has previously that he does not want.  However, conversations are ongoing between the patient, cardiology, nephrology.  Today saw the patient bedside, his wife was present.  We spent time talking about the events over the past 48 hours since I last seen him.  We talked about his rapid response event  overnight with chest pain and dyspnea, which both the patient and wife share with scary.  I shared, with patient permission, that cardiology seems to be indicating utility of cardiac cath.  However, this would be high risk related to his kidneys with the use of contrast and I explained physiologically why this happens.  There is a concern that he may end up hemodialysis dependent which he has previously said he did not want.  We spent time talking about wanting a treatment versus accepting a treatment in order to live.  We again discussed how previously he said he does not want to do peritoneal dialysis, but when we discussed further and deeper he indicated that he does not want to but he would in order to continue living.  He shares that he is reconsidering possibility of hemodialysis, if it will help him overall and in the big picture.  We spent time talking about the differences between peritoneal dialysis and hemodialysis both physiologically and his impact on quality of life.  I encouraged ongoing thoughtful discussions between cardiology and nephrology.  I encouraged the patient to discuss all of this with his wife as well.  The patient and wife shared that they are very attached to their primary nephrologist Casey French, and if possible it would be very helpful in their decision making if they could speak to him.  Unfortunately I shared that this may not be possible with the holiday and their office being closed until Monday.  Even if he was willing, he may not even be in town.  However, shared that I would put this in my note and discuss with the medical team as well.  I shared that while it does not seem that an urgent decision is needed right now, it would likely be needed in the next 1 to 2 days to plan for care.  I shared that no matter what the patient decides, palliative medicine will continue to support them in their deliberations, decision making, and results of decisions.  I encouraged them to  reach out to us  for new questions or concerns.  I shared that I would follow-up tomorrow for ongoing conversations.  I provided emotional and general support through therapeutic listening, empathy, sharing of stories, and other techniques. I answered all questions and addressed all concerns to the best of my ability.  Review of Systems  Respiratory:  Negative for shortness of breath.   Cardiovascular:  Negative for chest pain.  Gastrointestinal:  Negative for abdominal pain, nausea and vomiting.    Objective:   Primary Diagnoses: Present on Admission:  Acute pulmonary edema (HCC)   Vital Signs:  BP (!) 118/97   Pulse 72   Temp 98.5 F (36.9 C) (Oral)   Resp (!) 23   Ht 5' 9 (1.753 m)   Wt 56.2 kg   SpO2 99%   BMI 18.30 kg/m   Physical Exam Vitals and nursing note reviewed.  Constitutional:      General: He is not in acute distress.    Appearance: He is ill-appearing. He is not toxic-appearing.  HENT:     Head: Normocephalic and atraumatic.  Cardiovascular:     Rate  and Rhythm: Normal rate.  Pulmonary:     Effort: Pulmonary effort is normal. No respiratory distress.  Abdominal:     General: Abdomen is flat.     Palpations: Abdomen is soft.  Skin:    General: Skin is warm and dry.  Neurological:     General: No focal deficit present.     Mental Status: He is alert and oriented to person, place, and time.  Psychiatric:        Mood and Affect: Mood normal.        Behavior: Behavior normal.     Palliative Assessment/Data: 40-50%   Existing Vynca/ACP Documentation: None  Assessment & Plan:   HPI/Patient Profile:  74 y.o. male  with past medical history of ESRD on PD MWF, PAF on Eliquis , HTN/chronic HFpEF, HLD, CAD, IIDM, presented with worsening of chest pain and shortness of breath.  He was admitted on 01/23/2024 with hyperkalemia, acute on chronic HFpEF decompensation, acute hypoxic respiratory failure, anginal-like chest pain, ESRD on PD, and others.     Palliative medicine was consulted for GOC conversations given patient expressing wish to stop peritoneal dialysis.  SUMMARY OF RECOMMENDATIONS   DNR-Limited Continue full scope of care otherwise Patient is considering cardiac cath and/or hemodialysis Would be helpful if primary nephrologist Casey French would be able to speak with the patient and his wife, if possible Encouraged patient and family discussions for decisions sooner than later to allow planning for care Palliative medicine will continue to follow  Symptom Management:  Per primary team Palliative medicine is available to assist as needed  Code Status: DNR - Limited (DNR/DNI)  Prognosis: Unable to determine  Discharge Planning: To Be Determined  Discussed with: Patient, family, medical team, nursing team  Thank you for allowing us  to participate in the care of Casey French PMT will continue to support holistically.  Time Total: 40 min  Detailed review of medical records (labs, imaging, vital signs), medically appropriate exam, discussed with treatment team, counseling and education to patient, family, & staff, documenting clinical information, medication management, coordination of care  Casey Kays, NP Palliative Medicine Team  Team Phone # [phone] (Nights/Weekends)  09/23/2020, 8:17 AM  "

## 2024-01-26 NOTE — Progress Notes (Signed)
 "  PROGRESS NOTE AVEDIS BEVIS  FMW:[id number]  DOB: 08/10/1950  DOA: 01/23/2024 PCP: Valora Lynwood FALCON, MD  Hospital course: LAVERT MATOUSEK is a 74 year old old male with ESRD on PD MWF, PAF on Eliquis , HTN/chronic HFpEF, HLD, CAD, IIDM, presented with worsening of chest pain and shortness of breath after cutting down on his home PD because he was feeling unwell.  Workup in the ED revealed pulmonary edema on chest x-ray and patient was treated with Lasix  and placed on BiPAP.  Patient was treated with hemodialysis with improvement however developed A-fib with RVR. Elevation in troponin was thought to be type II NSTEMI in setting of increased filling pressures, pulmonary congestion having missed dialysis.  Echocardiogram without any new wall motion abnormalities.  Patient spontaneously converted back to normal sinus rhythm.  Subjective: Patient reports feeling well. Has no abdominal pain or complaints. Respiratory status improved.   Objective: Blood pressure 127/72, pulse 88, temperature 98.3 F (36.8 C), resp. rate (!) 23, height 5' 9 (1.753 m), weight 56.2 kg, SpO2 98%.  Exam: General: Patient sitting up in bed in no distress Eyes: sclera anicteric, conjuctiva mild injection bilaterally CVS: S1-S2, regular  Respiratory: Bilateral CTAB. No wheezing LE: Warm and well-perfused Neuro: A/O x 3 Psych: patient is logical and coherent, judgement and insight appear normal, mood and affect appropriate to situation.  Data Reviewed:  Basic Metabolic Panel: Recent Labs  Lab 01/23/24 0527 01/23/24 1232 01/24/24 0453 01/25/24 0357 01/26/24 0036  NA 142 142 140 141 138  K 6.0* 5.9* 4.8 4.4 4.1  CL 112* 112* 108 106 102  CO2 13* 13* 19* 20* 19*  GLUCOSE 82 83 118* 106* 182*  BUN 90* 93* 77* 64* 59*  CREATININE 9.79* 9.94* 8.58* 7.49* 7.12*  CALCIUM  7.6* 7.5* 7.8* 7.4* 7.5*    CBC: Recent Labs  Lab 01/23/24 0527 01/24/24 0453 01/25/24 0357 01/26/24 0036  WBC 9.5 7.0 7.0  9.0  NEUTROABS 7.7  --   --  6.9  HGB 8.7* 7.7* 7.5* 8.5*  HCT 28.2* 24.5* 23.7* 27.0*  MCV 97.9 96.5 93.3 95.4  PLT 239 202 210 248   Assessment & Plan:   SOB secondary to pulmonary edema secondary to missed PD ESRD on PD, missed PD- currently on 3L - wean O2 as tolerated.  - Continue PD per nephrology - PO torsemide  started - palliative was consulted to evaluate and better define GOC  Hyperkalemia- improved with PD  PAF with RVR- rate controlled.  Chest pain- resolved NSTEMI Patient seen by cardiology, troponins are chronically elevated around 250, however cardiology felt this was a type II NSTEMI secondary to increased filling pressures. Patient completed 2 days of heparin  drip, Toprol  and Imdur  were optimized by cardiology. Patient spontaneously converted back into sinus rhythm. Now back on Eliquis  Echocardiogram showed no new wall motion abnormalities Patient is to be discharged home on his usual metoprolol , increased Imdur  and Eliquis   DVT prophylaxis: Eliquis  Code Status: DNR Family Communication: None today  Studies: DG Chest 1 View Result Date: 01/26/2024 EXAM: 1 VIEW(S) XRAY OF THE CHEST 01/25/2024 10:28:00 PM COMPARISON: 01/23/2024 CLINICAL HISTORY: SOB (shortness of breath) FINDINGS: LUNGS AND PLEURA: Low lung volumes. Bibasilar airspace opacities, likely atelectasis. Small bilateral pleural effusions. No pneumothorax. HEART AND MEDIASTINUM: Mild cardiomegaly. Aortic arch atherosclerosis. BONES AND SOFT TISSUES: No acute osseous abnormality. IMPRESSION: 1. Low lung volumes with bibasilar airspace opacities, likely atelectasis. 2. Small bilateral pleural effusions. 3. Mild cardiomegaly and aortic arch atherosclerosis. Electronically signed by: Dorethia  Parikh MD 01/26/2024 02:18 AM EST RP Workstation: [id number]    Marien LITTIE Piety, [facility]  If 7PM-7AM, please contact night-coverage [email or web address]   LOS: 3 days  "

## 2024-01-26 NOTE — Progress Notes (Signed)
" °   01/26/24 0015  Spiritual Encounters  Type of Visit Initial  Care provided to: Pt and family  Conversation partners present during encounter Nurse  Referral source Code page  Reason for visit Code  OnCall Visit Yes   Chaplain responded to a RRT page to the patient's room and found the wife in the hallway.  Wife was tearful and Chaplain walked the Unit with the patient's wife so she could regulate herself as the staff worked with the patient.  Chaplain offered a compassionate presence and reflective listening.  Patient's daughter arrived and Chaplain provided drinks and said she'd be available, as needed.    Rev. Rana M. Nicholaus, M.Div. Chaplain Resident [facility] "

## 2024-01-26 NOTE — Progress Notes (Addendum)
 [facility]  CARDIOLOGY PROGRESS NOTE  Patient ID: Casey French MRN: [id number] DOB/AGE: 1950-03-17 74 y.o.  Admit date: 01/23/2024 Referring Physician Dr. Lenon Reason for Consultation Heart Failure, elevated troponin  HPI: Patient details that he again had episode of chest tightness and shortness of breath last night.  Review of systems complete and found to be negative unless listed above     Past Medical History:  Diagnosis Date   Acute diverticulitis 06/14/2014   Acute ST elevation myocardial infarction (STEMI) of inferior wall (HCC) 2003   a.) PCI with Cypher stent to the RCA   Anemia of chronic renal failure    Aortic atherosclerosis    Bladder tumor    CAP (community acquired pneumonia) 06/2022   Cerebral microvascular disease    Coronary artery disease involving native coronary artery of native heart without angina pectoris 2003   a.) PCI with Cypher stent to the RCA 2003; b.) LHC 08/2003: 100% dRCA, 60% pLAD - med mgmt; c.) LHC/PCI 08/21/2015: occluded RCA at site of prior stent placement, 70% mLAD (FFR 0.79; 3.5 x 12 mm Xience DES), 50% dLAD (FFR 0.70); d.) MV 11/11/2016: no ischemia   Depression    Diverticulosis    End-stage renal disease on peritoneal dialysis (HCC)    GERD (gastroesophageal reflux disease)    GI bleed 05/15/2019   Gout    Heart murmur    Hematemesis 05/15/2019   Hepatic cyst    Hepatomegaly    Hyperlipidemia    Hypertension    Lacunar infarction (HCC) 02/11/2007   a.) CT head 02/11/2007: lacunar infarction involving thalamus and external capsule region on the RIGHT; b.) MRI brain 02/03/2020: multiple remote lacunar infarcts involving the RIGHT basal ganglia, RIGHT thalamus, and LEFT middle cerebellar peduncle   Long-term use of aspirin  therapy    Myocardial bridge    On apixaban  therapy    Pancolitis 09/2022   Paroxysmal atrial fibrillation (HCC)    a.) CHA2DS2VASc = 6 (age, HTN, CVA x 2, prior MI/vascular disease, T2DM) as of  03/11/2023; b.) rate/rhythm maintained on oral metoprolol  succinate; chronically anticoagulated with dose reduced apixaban    Renal cyst, right    Sigmoid diverticulitis 03/2022   T2DM (type 2 diabetes mellitus) (HCC)    Traumatic skull fracture and resulting subarachnoid hemorrhage 03/24/2013   a.) MOI was slipping and falling on ice   Unstable angina [facility])     Past Surgical History:  Procedure Laterality Date   BLADDER INSTILLATION N/A 04/05/2023   Procedure: BLADDER INSTILLATION OF GEMCITABINE ;  Surgeon: Twylla Glendia BROCKS, MD;  Location: [facility] ORS;  Service: Urology;  Laterality: N/A;   CAPD INSERTION N/A 08/13/2020   Procedure: LAPAROSCOPIC INSERTION CONTINUOUS AMBULATORY PERITONEAL DIALYSIS  (CAPD) CATHETER;  Surgeon: Lane Shope, MD;  Location: [facility] ORS;  Service: General;  Laterality: N/A;   CARDIAC CATHETERIZATION     CATARACT EXTRACTION W/PHACO Right 07/05/2019   Procedure: CATARACT EXTRACTION PHACO AND INTRAOCULAR LENS PLACEMENT (IOC) RIGHT DIABETIC;  Surgeon: Jaye Fallow, MD;  Location: [facility] ORS;  Service: Ophthalmology;  Laterality: Right;  US00:59.3 CDE8.41 LOT [id number] h   COLONOSCOPY WITH PROPOFOL  N/A 04/01/2017   Procedure: COLONOSCOPY WITH PROPOFOL ;  Surgeon: Gaylyn Gladis PENNER, MD;  Location: [facility] ENDOSCOPY;  Service: Endoscopy;  Laterality: N/A;   CORONARY ANGIOPLASTY WITH STENT PLACEMENT  10/2001   RCA   CORONARY ANGIOPLASTY WITH STENT PLACEMENT Left 08/21/2015   Procedure: CORONARY ANGIOPLASTY WITH STENT PLACEMENT (mid LAD); Location: [facility]; Surgeon: Zachary Car, MD   ESOPHAGOGASTRODUODENOSCOPY N/A 04/24/2023  Procedure: EGD (ESOPHAGOGASTRODUODENOSCOPY);  Surgeon: Toledo, Ladell POUR, MD;  Location: [facility] ENDOSCOPY;  Service: Gastroenterology;  Laterality: N/A;   HAND SURGERY  2015   3 screws in right hand   TOTAL HIP ARTHROPLASTY Left 04/22/2020   Procedure: TOTAL HIP ARTHROPLASTY ANTERIOR APPROACH;  Surgeon: Kathlynn Sharper, MD;  Location: [facility] ORS;  Service:  Orthopedics;  Laterality: Left;   TRANSURETHRAL RESECTION OF BLADDER TUMOR N/A 04/05/2023   Procedure: TURBT (TRANSURETHRAL RESECTION OF BLADDER TUMOR);  Surgeon: Twylla Glendia BROCKS, MD;  Location: [facility] ORS;  Service: Urology;  Laterality: N/A;    Medications Prior to Admission  Medication Sig Dispense Refill Last Dose/Taking   acetaminophen  (TYLENOL ) 325 MG tablet Take 2 tablets (650 mg total) by mouth every 6 (six) hours as needed for mild pain (or Fever >/= 101).   Unknown   amLODipine  (NORVASC ) 10 MG tablet Take 10 mg by mouth daily.   Unknown   apixaban  (ELIQUIS ) 2.5 MG TABS tablet Take 2.5 mg by mouth 2 (two) times daily.   Unknown   calcitRIOL  (ROCALTROL ) 0.25 MCG capsule Take 1 capsule (0.25 mcg total) by mouth daily. 30 capsule 0 Unknown   calcium  carbonate (TUMS) 500 MG chewable tablet Chew 2 tablets by mouth 3 (three) times daily with meals.   Unknown   cyanocobalamin  (VITAMIN B12) 500 MCG tablet Take 1 tablet (500 mcg total) by mouth daily. 30 tablet 0 Unknown   folic acid  (FOLVITE ) 1 MG tablet Take 1 tablet (1 mg total) by mouth daily. 30 tablet 0 Unknown   furosemide  (LASIX ) 80 MG tablet Take 80 mg by mouth daily.   Unknown   gabapentin  (NEURONTIN ) 300 MG capsule Take 300 mg by mouth at bedtime.  11 Unknown   hydrocortisone  (ANUSOL -HC) 2.5 % rectal cream Place 1 application rectally 2 (two) times daily as needed for hemorrhoids or anal itching.   Unknown   metoprolol  succinate (TOPROL -XL) 100 MG 24 hr tablet Take 1 tablet (100 mg total) by mouth daily with breakfast. Take with or immediately following a meal. 30 tablet 0 Unknown   nitroGLYCERIN  (NITROSTAT ) 0.4 MG SL tablet Place 0.4 mg under the tongue every 5 (five) minutes as needed for chest pain.   Unknown   omeprazole 20 MG TBDD disintegrating tablet Take 20 mg by mouth daily.   Unknown   ondansetron  (ZOFRAN ) 4 MG tablet Take 4 mg by mouth every 8 (eight) hours as needed.   Unknown   pantoprazole  (PROTONIX ) 40 MG tablet Take 1  tablet (40 mg total) by mouth daily. 30 tablet 1 Unknown   promethazine  (PHENERGAN ) 25 MG tablet Take 1 tablet (25 mg total) by mouth every 8 (eight) hours as needed for refractory nausea / vomiting. 20 tablet 0 Unknown   sevelamer  carbonate (RENVELA ) 800 MG tablet Take 800 mg by mouth 3 (three) times daily with meals.   Unknown   sodium bicarbonate  650 MG tablet Take 2 tablets (1,300 mg total) by mouth 2 (two) times daily. 120 tablet 0 Unknown   acetaminophen  (TYLENOL ) 325 MG tablet Take 2 tablets (650 mg total) by mouth every 6 (six) hours as needed for mild pain (pain score 1-3) or fever (or Fever >/= 101).      escitalopram  (LEXAPRO ) 10 MG tablet Take 10 mg by mouth daily. For 360 days (Patient not taking: Reported on 01/23/2024)   Not Taking   furosemide  (LASIX ) 20 MG tablet Take 20 mg by mouth daily. (Patient not taking: Reported on 01/23/2024)   Not Taking  isosorbide  mononitrate (IMDUR ) 30 MG 24 hr tablet Take 1 tablet (30 mg total) by mouth daily as needed (Systolic BP greater than 140). 30 tablet 1    Social History   Socioeconomic History   Marital status: Married    Spouse name: Sherri   Number of children: 2   Years of education: Not on file   Highest education level: Not on file  Occupational History   Not on file  Tobacco Use   Smoking status: Former    Current packs/day: 0.00    Types: Cigarettes    Quit date: 03/24/2001    Years since quitting: 22.8   Smokeless tobacco: Never  Vaping Use   Vaping status: Never Used  Substance and Sexual Activity   Alcohol use: No    Alcohol/week: 0.0 standard drinks of alcohol   Drug use: No   Sexual activity: Not on file  Other Topics Concern   Not on file  Social History Narrative   Lives with wife   Social Drivers of Health   Tobacco Use: Medium Risk (01/12/2024)   Received from [facility]   Patient History    Smoking Tobacco Use: Former    Smokeless Tobacco Use: Never    Passive Exposure: Not on  file  Financial Resource Strain: Medium Risk (11/28/2023)   Received from [facility]   Overall Financial Resource Strain (CARDIA)    Difficulty of Paying Living Expenses: Somewhat hard  Food Insecurity: Food Insecurity Present (01/24/2024)   Epic    Worried About Programme Researcher, Broadcasting/film/video in the Last Year: Sometimes true    Ran Out of Food in the Last Year: Never true  Transportation Needs: No Transportation Needs (01/24/2024)   Epic    Lack of Transportation (Medical): No    Lack of Transportation (Non-Medical): No  Physical Activity: Not on file  Stress: Not on file  Social Connections: Socially Isolated (01/24/2024)   Social Connection and Isolation Panel    Frequency of Communication with Friends and Family: Once a week    Frequency of Social Gatherings with Friends and Family: Never    Attends Religious Services: Never    Database Administrator or Organizations: No    Attends Banker Meetings: Never    Marital Status: Married  Catering Manager Violence: Not At Risk (01/24/2024)   Epic    Fear of Current or Ex-Partner: No    Emotionally Abused: No    Physically Abused: No    Sexually Abused: No  Depression (PHQ2-9): Not on file  Alcohol Screen: Not on file  Housing: Low Risk (01/24/2024)   Epic    Unable to Pay for Housing in the Last Year: No    Number of Times Moved in the Last Year: 0    Homeless in the Last Year: No  Utilities: Not At Risk (01/24/2024)   Epic    Threatened with loss of utilities: No  Health Literacy: Not on file    Family History  Problem Relation Age of Onset   Heart disease Mother    Heart disease Father    CAD Other       Review of systems complete and found to be negative unless listed above      PHYSICAL EXAM  Alert and oriented x 3 Grade 2 systolic murmur aortic area No pedal edema No wheeze or crackles  Labs:   Lab Results  Component Value Date   WBC 9.0 01/26/2024  HGB 8.5 (L) 01/26/2024    HCT 27.0 (L) 01/26/2024   MCV 95.4 01/26/2024   PLT 248 01/26/2024    Recent Labs  Lab 01/26/24 0036  NA 138  K 4.1  CL 102  CO2 19*  BUN 59*  CREATININE 7.12*  CALCIUM  7.5*  GLUCOSE 182*   Lab Results  Component Value Date   CKTOTAL 198 03/23/2013   CKMB 4.6 (H) 03/23/2013   TROPONINI <0.03 06/26/2018    Lab Results  Component Value Date   CHOL 142 11/10/2016   CHOL 194 09/27/2012   Lab Results  Component Value Date   HDL 45 11/10/2016   HDL 49 09/27/2012   Lab Results  Component Value Date   LDLCALC 75 11/10/2016   LDLCALC 98 09/27/2012   Lab Results  Component Value Date   TRIG 109 11/10/2016   TRIG 233 (H) 09/27/2012   Lab Results  Component Value Date   CHOLHDL 3.2 11/10/2016   No results found for: LDLDIRECT    Radiology: DG Chest 1 View Result Date: 01/26/2024 EXAM: 1 VIEW(S) XRAY OF THE CHEST 01/25/2024 10:28:00 PM COMPARISON: 01/23/2024 CLINICAL HISTORY: SOB (shortness of breath) FINDINGS: LUNGS AND PLEURA: Low lung volumes. Bibasilar airspace opacities, likely atelectasis. Small bilateral pleural effusions. No pneumothorax. HEART AND MEDIASTINUM: Mild cardiomegaly. Aortic arch atherosclerosis. BONES AND SOFT TISSUES: No acute osseous abnormality. IMPRESSION: 1. Low lung volumes with bibasilar airspace opacities, likely atelectasis. 2. Small bilateral pleural effusions. 3. Mild cardiomegaly and aortic arch atherosclerosis. Electronically signed by: Dorethia Molt MD 01/26/2024 02:18 AM EST RP Workstation: [id number]   CT Angio Chest PE W and/or Wo Contrast Result Date: 01/23/2024 EXAM: CTA CHEST 01/23/2024 07:59:59 AM TECHNIQUE: CTA of the chest was performed without and with the administration of 75 mL of intravenous iohexol  (OMNIPAQUE ) 350 MG/ML injection. Multiplanar reformatted images are provided for review. MIP images are provided for review. Automated exposure control, iterative reconstruction, and/or weight based adjustment of the mA/kV was  utilized to reduce the radiation dose to as low as reasonably achievable. COMPARISON: 04/22/2023 aortic atherosclerotic calcification. Coronary artery calcifications. CLINICAL HISTORY: hypoxia FINDINGS: PULMONARY ARTERIES: Pulmonary arteries are adequately opacified for evaluation. No acute pulmonary embolus. Main pulmonary artery is normal in caliber. MEDIASTINUM: Coronary artery calcifications. The pericardium demonstrates no acute abnormality. Aortic atherosclerotic calcification. There is no acute abnormality of the thoracic aorta. LYMPH NODES: Right hilar lymph node has increased in size, measuring 1.3 cm. No mediastinal or axillary lymphadenopathy. LUNGS AND PLEURA: Diffuse interlobular septal thickening is identified along with ground glass attenuation, most likely representing pulmonary edema. Band-like area of consolidative change within the posterior and anterior basal right lower lobe compatible with atelectasis and/or pneumonia. Consolidative change within the posterior left base. Emphysema. Small right pleural effusion. No pneumothorax. UPPER ABDOMEN: Perihepatic and perisplenic ascites noted. SOFT TISSUES AND BONES: No acute or suspicious osseous findings. No acute soft tissue abnormality. IMPRESSION: 1. Diffuse interlobular septal thickening and ground glass attenuation, most likely representing pulmonary edema. 2. Small right pleural effusion. 3. Band-like area of consolidative change within the posterior and anterior basal right lower lobe compatible with atelectasis and/or pneumonia. 4. Consolidative change within the posterior left base, pneumonia versus aspiration vs asymmetric edema. Electronically signed by: Waddell Calk MD 01/23/2024 08:16 AM EST RP Workstation: [id number]   DG Chest 1 View Result Date: 01/23/2024 EXAM: 1 VIEW(S) XRAY OF THE CHEST 01/23/2024 05:35:00 AM COMPARISON: 12/21/2023 CLINICAL HISTORY: 74 year old male with chest pain. FINDINGS: LUNGS AND PLEURA: Pulmonary  interstitial prominence,  no overt pulmonary edema. Hazy opacity at right lung base, possible airspace consolidation or atelectasis. Small right pleural effusion. No pneumothorax. Mildly lower lung volumes. HEART AND MEDIASTINUM: Cardiomegaly. Calcified aorta. BONES AND SOFT TISSUES: No acute osseous abnormality. Negative visible bowel gas. IMPRESSION: 1. Lower lung volumes with Right basilar opacity, atelectasis vs infection, probable small effusion. Electronically signed by: Helayne Hurst MD 01/23/2024 05:48 AM EST RP Workstation: [id number]    ASSESSMENT AND PLAN:  Acute on chronic heart failure with preserved EF NSTEMI more likely type II A-fib with RVR Coronary artery disease with prior stents ESRD on peritoneal dialysis Hypertension, hyperlipidemia  Patient who presented with shortness of breath/chest tightness after missing dialysis. He is back on peritoneal dialysis in last few days.  Continues to have intermittent episodes of shortness of breath with chest tightness. Not sure if peritoneal dialysis is adequate enough, hemodialysis was discussed by nephro and he is not interested.  Clinically not volume overload.  Other possible etiology including CAD contributing to some of his symptoms.  Although echo with preserved LVEF/wall motion and recent stress test with only small area of mild ischemia similar to before. He is on antianginal therapy with amlodipine , metoprolol  and Imdur . Going to discuss with nephrology if peritoneal dialysis is adequate.  Also discussed with patient about consideration of heart cath with his continued symptoms, although this will cause further renal worsening-currently has some urine output.  Need to address this as patient is reluctant to have hemodialysis if needed. Will stop Eliquis  for now and start him on heparin  drip. Low-dose aspirin   Addendum: Discussed with nephro and also patient/wife.  Will try to increase fluid removal through peritoneal dialysis.  I  will also increase metoprolol  and see how he does.  Signed: Keller JAYSON Paterson MD 01/26/2024, 8:46 AM

## 2024-01-26 NOTE — Progress Notes (Addendum)
" °   01/26/24 0700  Peritoneal Catheter Left lower abdomen Continuous ambulatory  Placement Date/Time: 08/13/20 1246   Procedural Verification: Medical records & consent reviewed  Person Inserting LDA: Dr Derrill  Catheter Location: Left lower abdomen  Catheter Conversion: Extension added  Serial / Lot #: [id number]  Dialysis Type: C...  Site Assessment Clean, Dry, Intact  Drainage Description None  Catheter status Deaccessed;Clamped  Dressing Gauze/Drain sponge  Dressing Status Clean, Dry, Intact  Dressing Intervention Assessed, no intervention needed  Completion  Treatment Status Complete  Initial Drain Volume 28  Average Dwell Time-Hour(s) 90  Average Drain Time 32  Total Therapy Volume 89999  Total Therapy Time-Hour(s) 8  Total Therapy Time-Min(s) 58  Weight after Drain 123 lb 14.4 oz (56.2 kg)  Effluent Appearance Clear;Yellow  Fluid Balance - CCPD  Total UF (+ value on cycler, pt loss) -7 mL  Procedure Comments  Tolerated treatment well? Yes  Peritoneal Dialysis Comments Pt completed tx. Night nurse stated a rapid was called on pt for Cp, sob vomiting and diaphoretic. Pt is stable at this time. no other issues.   UF -7 "

## 2024-01-26 NOTE — Progress Notes (Signed)
 "       SIGNIFICANT EVENT NOTE  Includes rapid response, CODE BLUE, acute worsening of clinical condition, need for bedside evaluation/urgent imaging, labs, consults, therapeutics  Event: Overhead Rapid Response  NAME: Casey French MRN: [id number] DOB : 1950-02-12    Reason for event   Acute respiratory distress, requiring O2 at 3 L up from 2 L earlier, nausea vomiting     Past medical history context -74 yo with ESRD on PD, paroxysmal A-fib, HFpEF admitted with fluid overload--he had skipped PD x 2 nights. -Also noted to have elevated troponin around 250, which is chronic. -Was on heparin  drip 11/29 x 2 days-now back on Eliquis .  -Declining HD, though apparently recommended by nephrology  Sequence of events -Chat messages earlier in the night to NP as patient was anxious and nauseous, and short of breath -DuoNeb, hydroxyzine and Compazine were ordered by NP -EKG was done which showed sinus rhythm with first-degree AV block -Chest x-ray was done, not yet resulted -Rapid response called after patient developed worsening shortness of breath  Initial patient rapid assessment Vitals trend: Date/Time Temp Pulse Resp BP SpO2 O2 Device O2  (L/min)  01/26/24 0000 -- 91 25 Abnormal  174/88  96 % -- --  01/25/24 2300 -- 83 23 Abnormal  138/78 99 % -- --  01/25/24 2200 -- -- 20 -- -- -- --  01/25/24 2105 -- -- -- -- 98 % Nasal Cannula 3 L/min  Upon my arrival to the bedside, rapid response team present along with bedside nurse Patient sitting with head of bed elevated.  Tachypneic, vomiting into emesis bag, looks unwell, speaking in one-word sentences. Nurse reports he had chest tightness earlier Physical Exam Vitals and nursing note reviewed.  Constitutional:      Appearance: He is ill-appearing.     Comments: Vomiting into emesis bag  Cardiovascular:     Rate and Rhythm: Regular rhythm. Tachycardia present.  Pulmonary:     Effort: Tachypnea, accessory muscle usage and  respiratory distress present.     Breath sounds: Wheezing present.      Initial diagnostic considerations/ plan/interventions -Suspect acute worsening of respiratory failure secondary to fluid overload, inadequate removal during dialysis -Suspect uremic symptoms given missed PD sessions and patient declining HD -Concern for ACS given prior elevated troponins and complaint of chest tightness  Diagnostic Workup -VBG to evaluate for hypercapnia -Consideration for BiPAP/HFNC if evidence of CO2 retention -Troponin to evaluate for ACS -BMP for concern for uremia -Follow-up chest x-ray to evaluate for pulmonary edema -Empiric Lasix  40 mg IV and Nitropaste half inch -Strict intake and output monitoring -Hold anxiolytics for now -Follow-up official chest x-ray report -Keep n.p.o. until more stable Results: Troponins flat in the low 200s   Results Troponins flat in the 200s-baseline EKG without ischemia Chest x-ray showing small bilateral effusions ABG    Component Value Date/Time   HCO3 21.9 01/26/2024 0038   TCO2 20 (L) 04/05/2023 0657   ACIDBASEDEF 2.8 (H) 01/26/2024 0038   O2SAT 95.1 01/26/2024 0038      Latest Ref Rng & Units 01/26/2024   12:36 AM 01/25/2024    3:57 AM 01/24/2024    4:53 AM  BMP  Glucose 70 - 99 mg/dL 817  893  881   BUN 8 - 23 mg/dL 59  64  77   Creatinine 0.61 - 1.24 mg/dL 2.87  2.50  1.41   Sodium 135 - 145 mmol/L 138  141  140   Potassium 3.5 -  5.1 mmol/L 4.1  4.4  4.8   Chloride 98 - 111 mmol/L 102  106  108   CO2 22 - 32 mmol/L 19  20  19    Calcium  8.9 - 10.3 mg/dL 7.5  7.4  7.8       Latest Ref Rng & Units 01/26/2024   12:36 AM 01/25/2024    3:57 AM 01/24/2024    4:53 AM  CBC  WBC 4.0 - 10.5 K/uL 9.0  7.0  7.0   Hemoglobin 13.0 - 17.0 g/dL 8.5  7.5  7.7   Hematocrit 39.0 - 52.0 % 27.0  23.7  24.5   Platelets 150 - 400 K/uL 248  210  202       Response to interventions Patient improved with above therapy Vitals improved: Date/Time Temp Pulse  Resp BP SpO2 O2  (L/min)  01/26/24 0330 -- 72 23 Abnormal  118/97 Abnormal  99 % --3  01/26/24 0300 -- 74 18 120/58 Abnormal  98 % --  01/26/24 0230 -- 75 18 124/53 Abnormal  97 % --  01/26/24 0200 -- 76 19 111/53 Abnormal  96 % --  01/26/24 0130 -- 73 17 129/105 Abnormal  97 % --    Final assessment     Assessment:  -Fluid overload secondary to need for dialysis--improved with Lasix  and Nitropaste -Pleural effusion and cardiomegaly secondary to fluid overload -Metabolic acidosis secondary to kidney disease with compensatory hyperventilation  Plan: Continue primary management Renal follow-up in the a.m. X       CRITICAL CARE Performed by: Delayne LULLA Solian   Total critical care time: 80 minutes  Critical care time was exclusive of separately billable procedures and treating other patients.  Critical care was necessary to treat or prevent imminent or life-threatening deterioration.  Critical care was time spent personally by me on the following activities: development of treatment plan with patient and/or surrogate as well as nursing, discussions with consultants, evaluation of patient's response to treatment, examination of patient, obtaining history from patient or surrogate, ordering and performing treatments and interventions, ordering and review of laboratory studies, ordering and review of radiographic studies, pulse oximetry and re-evaluation of patient's condition.        "

## 2024-01-26 NOTE — Consult Note (Addendum)
 PHARMACY - ANTICOAGULATION CONSULT NOTE  Pharmacy Consult for Heparin   Indication: chest pain/ACS  Allergies[1]  Patient Measurements: Height: 5' 9 (175.3 cm) Weight: 56.2 kg (123 lb 14.4 oz) IBW/kg (Calculated) : 70.7 HEPARIN  DW (KG): 57.2  Vital Signs: BP: 118/97 (01/01 0330) Pulse Rate: 72 (01/01 0330)  Labs: Recent Labs    01/23/24 1232 01/23/24 1914 01/24/24 0453 01/24/24 1352 01/24/24 2356 01/25/24 0357 01/26/24 0036  HGB  --    < > 7.7*  --   --  7.5* 8.5*  HCT  --   --  24.5*  --   --  23.7* 27.0*  PLT  --   --  202  --   --  210 248  APTT 41*  --   --   --   --   --   --   LABPROT 16.2*  --   --   --   --   --   --   INR 1.2  --   --   --   --   --   --   HEPARINUNFRC 0.13*   < > 0.30 0.26* 0.30  --   --   CREATININE 9.94*  --  8.58*  --   --  7.49* 7.12*   < > = values in this interval not displayed.    Estimated Creatinine Clearance: 7.5 mL/min (A) (by C-G formula based on SCr of 7.12 mg/dL (H)).   Medical History: Past Medical History:  Diagnosis Date   Acute diverticulitis 06/14/2014   Acute ST elevation myocardial infarction (STEMI) of inferior wall (HCC) 2003   a.) PCI with Cypher stent to the RCA   Anemia of chronic renal failure    Aortic atherosclerosis    Bladder tumor    CAP (community acquired pneumonia) 06/2022   Cerebral microvascular disease    Coronary artery disease involving native coronary artery of native heart without angina pectoris 2003   a.) PCI with Cypher stent to the RCA 2003; b.) LHC 08/2003: 100% dRCA, 60% pLAD - med mgmt; c.) LHC/PCI 08/21/2015: occluded RCA at site of prior stent placement, 70% mLAD (FFR 0.79; 3.5 x 12 mm Xience DES), 50% dLAD (FFR 0.70); d.) MV 11/11/2016: no ischemia   Depression    Diverticulosis    End-stage renal disease on peritoneal dialysis (HCC)    GERD (gastroesophageal reflux disease)    GI bleed 05/15/2019   Gout    Heart murmur    Hematemesis 05/15/2019   Hepatic cyst    Hepatomegaly     Hyperlipidemia    Hypertension    Lacunar infarction (HCC) 02/11/2007   a.) CT head 02/11/2007: lacunar infarction involving thalamus and external capsule region on the RIGHT; b.) MRI brain 02/03/2020: multiple remote lacunar infarcts involving the RIGHT basal ganglia, RIGHT thalamus, and LEFT middle cerebellar peduncle   Long-term use of aspirin  therapy    Myocardial bridge    On apixaban  therapy    Pancolitis 09/2022   Paroxysmal atrial fibrillation (HCC)    a.) CHA2DS2VASc = 6 (age, HTN, CVA x 2, prior MI/vascular disease, T2DM) as of 03/11/2023; b.) rate/rhythm maintained on oral metoprolol  succinate; chronically anticoagulated with dose reduced apixaban    Renal cyst, right    Sigmoid diverticulitis 03/2022   T2DM (type 2 diabetes mellitus) (HCC)    Traumatic skull fracture and resulting subarachnoid hemorrhage 03/24/2013   a.) MOI was slipping and falling on ice   Unstable angina (HCC)     Medications:  Medications Prior to Admission  Medication Sig Dispense Refill Last Dose/Taking   acetaminophen  (TYLENOL ) 325 MG tablet Take 2 tablets (650 mg total) by mouth every 6 (six) hours as needed for mild pain (or Fever >/= 101).   Unknown   amLODipine  (NORVASC ) 10 MG tablet Take 10 mg by mouth daily.   Unknown   apixaban  (ELIQUIS ) 2.5 MG TABS tablet Take 2.5 mg by mouth 2 (two) times daily.   Unknown   calcitRIOL  (ROCALTROL ) 0.25 MCG capsule Take 1 capsule (0.25 mcg total) by mouth daily. 30 capsule 0 Unknown   calcium  carbonate (TUMS) 500 MG chewable tablet Chew 2 tablets by mouth 3 (three) times daily with meals.   Unknown   cyanocobalamin  (VITAMIN B12) 500 MCG tablet Take 1 tablet (500 mcg total) by mouth daily. 30 tablet 0 Unknown   folic acid  (FOLVITE ) 1 MG tablet Take 1 tablet (1 mg total) by mouth daily. 30 tablet 0 Unknown   furosemide  (LASIX ) 80 MG tablet Take 80 mg by mouth daily.   Unknown   gabapentin  (NEURONTIN ) 300 MG capsule Take 300 mg by mouth at bedtime.  11 Unknown    hydrocortisone  (ANUSOL -HC) 2.5 % rectal cream Place 1 application rectally 2 (two) times daily as needed for hemorrhoids or anal itching.   Unknown   metoprolol  succinate (TOPROL -XL) 100 MG 24 hr tablet Take 1 tablet (100 mg total) by mouth daily with breakfast. Take with or immediately following a meal. 30 tablet 0 Unknown   nitroGLYCERIN  (NITROSTAT ) 0.4 MG SL tablet Place 0.4 mg under the tongue every 5 (five) minutes as needed for chest pain.   Unknown   omeprazole 20 MG TBDD disintegrating tablet Take 20 mg by mouth daily.   Unknown   ondansetron  (ZOFRAN ) 4 MG tablet Take 4 mg by mouth every 8 (eight) hours as needed.   Unknown   pantoprazole  (PROTONIX ) 40 MG tablet Take 1 tablet (40 mg total) by mouth daily. 30 tablet 1 Unknown   promethazine  (PHENERGAN ) 25 MG tablet Take 1 tablet (25 mg total) by mouth every 8 (eight) hours as needed for refractory nausea / vomiting. 20 tablet 0 Unknown   sevelamer  carbonate (RENVELA ) 800 MG tablet Take 800 mg by mouth 3 (three) times daily with meals.   Unknown   sodium bicarbonate  650 MG tablet Take 2 tablets (1,300 mg total) by mouth 2 (two) times daily. 120 tablet 0 Unknown   acetaminophen  (TYLENOL ) 325 MG tablet Take 2 tablets (650 mg total) by mouth every 6 (six) hours as needed for mild pain (pain score 1-3) or fever (or Fever >/= 101).      escitalopram  (LEXAPRO ) 10 MG tablet Take 10 mg by mouth daily. For 360 days (Patient not taking: Reported on 01/23/2024)   Not Taking   furosemide  (LASIX ) 20 MG tablet Take 20 mg by mouth daily. (Patient not taking: Reported on 01/23/2024)   Not Taking   isosorbide  mononitrate (IMDUR ) 30 MG 24 hr tablet Take 1 tablet (30 mg total) by mouth daily as needed (Systolic BP greater than 140). 30 tablet 1    Scheduled:   amLODipine   10 mg Oral Daily   aspirin  EC  81 mg Oral Daily   calcitRIOL   0.25 mcg Oral Daily   calcium  carbonate  2 tablet Oral TID WC   epoetin  alfa-epbx (RETACRIT ) injection  20,000 Units Subcutaneous  Weekly   gentamicin  cream  1 Application Topical Daily   isosorbide  mononitrate  120 mg Oral BID   metoprolol   succinate  100 mg Oral Q breakfast   pantoprazole   40 mg Oral Daily   sodium bicarbonate   1,300 mg Oral BID   sodium chloride  flush  3 mL Intravenous Q12H   torsemide   100 mg Oral Daily   Infusions:   dialysis solution 2.5% low-MG/low-CA     PRN: acetaminophen , hydrALAZINE , HYDROmorphone  (DILAUDID ) injection, hydrOXYzine, ondansetron  (ZOFRAN ) IV, sodium chloride  flush, zolpidem  Anti-infectives (From admission, onward)    None       Assessment: 74 year old old male with ESRD on PD MWF, PAF on Eliquis , HTN/chronic HFpEF, HLD, CAD, IIDM, presented with worsening of chest pain and shortness of breath after cutting down on his home PD because he was feeling unwell. Trop 255 > 212. Will use aPTT to monitor heparin .   Last dose apixaban  12/31 ~2200.   Goal of Therapy:  Heparin  level 0.3-0.7 units/ml once aPTT and heparin  level correlate.  aPTT 66-102 seconds Monitor platelets by anticoagulation protocol: Yes   Plan:  Give 3000 units bolus x 1 Start heparin  infusion at 650 units/hr Check aPTT level in 8 hours and daily while on heparin  Continue to monitor H&H and platelets  Cathaleen GORMAN Blanch, PharmD, BCPS 01/26/2024,10:33 AM      [1]  Allergies Allergen Reactions   Morphine  Anxiety    agitation

## 2024-01-26 NOTE — Significant Event (Signed)
 Rapid Response Event Note   Reason for Call :  SOB and Vomiting   Initial Focused Assessment:  Patient with increase work of breathing vomiting in emesis bag.  BP 182/108 HR 82 RR 30 O2 94% on 3L.  Patient able to speak in sentences  but labored complaining of nausea. Dr Cleatus and NP  Dontai-Garmon at Bedside. Lasix 's, compazine, Nitroglycerin  Paste and labs ordered.     0215 check in with patient resting in bed. Patient woke up when I entered the room and reported he felt much better no longer short of breath or nauseous.  Spoke to primary nurse and she reported he has been rest since he got the medication listed above in the rapid. Primary nurse encouraged  to reach out with any changes or concerns.    Event Summary:   MD Notified:  Cleatus MD and  Call Time:0022 Arrival Time:0025 End Upfz:[id number]  Lesley LOISE Shams, RN

## 2024-01-26 NOTE — Plan of Care (Signed)

## 2024-01-26 NOTE — Consult Note (Signed)
 PHARMACY - ANTICOAGULATION CONSULT NOTE  Pharmacy Consult for Heparin   Indication: chest pain/ACS  Allergies[1]  Patient Measurements: Height: 5' 9 (175.3 cm) Weight: 56.2 kg (123 lb 14.4 oz) IBW/kg (Calculated) : 70.7 HEPARIN  DW (KG): 57.2  Vital Signs: Temp: 98.3 F (36.8 C) (01/01 1514) BP: 130/78 (01/01 1800) Pulse Rate: 96 (01/01 1800)  Labs: Recent Labs    01/24/24 0453 01/24/24 1352 01/24/24 2356 01/25/24 0357 01/26/24 0036 01/26/24 1842  HGB 7.7*  --   --  7.5* 8.5*  --   HCT 24.5*  --   --  23.7* 27.0*  --   PLT 202  --   --  210 248  --   APTT  --   --   --   --   --  61*  HEPARINUNFRC 0.30 0.26* 0.30  --   --   --   CREATININE 8.58*  --   --  7.49* 7.12*  --     Estimated Creatinine Clearance: 7.5 mL/min (A) (by C-G formula based on SCr of 7.12 mg/dL (H)).   Medical History: Past Medical History:  Diagnosis Date   Acute diverticulitis 06/14/2014   Acute ST elevation myocardial infarction (STEMI) of inferior wall (HCC) 2003   a.) PCI with Cypher stent to the RCA   Anemia of chronic renal failure    Aortic atherosclerosis    Bladder tumor    CAP (community acquired pneumonia) 06/2022   Cerebral microvascular disease    Coronary artery disease involving native coronary artery of native heart without angina pectoris 2003   a.) PCI with Cypher stent to the RCA 2003; b.) LHC 08/2003: 100% dRCA, 60% pLAD - med mgmt; c.) LHC/PCI 08/21/2015: occluded RCA at site of prior stent placement, 70% mLAD (FFR 0.79; 3.5 x 12 mm Xience DES), 50% dLAD (FFR 0.70); d.) MV 11/11/2016: no ischemia   Depression    Diverticulosis    End-stage renal disease on peritoneal dialysis (HCC)    GERD (gastroesophageal reflux disease)    GI bleed 05/15/2019   Gout    Heart murmur    Hematemesis 05/15/2019   Hepatic cyst    Hepatomegaly    Hyperlipidemia    Hypertension    Lacunar infarction (HCC) 02/11/2007   a.) CT head 02/11/2007: lacunar infarction involving thalamus and  external capsule region on the RIGHT; b.) MRI brain 02/03/2020: multiple remote lacunar infarcts involving the RIGHT basal ganglia, RIGHT thalamus, and LEFT middle cerebellar peduncle   Long-term use of aspirin  therapy    Myocardial bridge    On apixaban  therapy    Pancolitis 09/2022   Paroxysmal atrial fibrillation (HCC)    a.) CHA2DS2VASc = 6 (age, HTN, CVA x 2, prior MI/vascular disease, T2DM) as of 03/11/2023; b.) rate/rhythm maintained on oral metoprolol  succinate; chronically anticoagulated with dose reduced apixaban    Renal cyst, right    Sigmoid diverticulitis 03/2022   T2DM (type 2 diabetes mellitus) (HCC)    Traumatic skull fracture and resulting subarachnoid hemorrhage 03/24/2013   a.) MOI was slipping and falling on ice   Unstable angina (HCC)     Medications:  Medications Prior to Admission  Medication Sig Dispense Refill Last Dose/Taking   acetaminophen  (TYLENOL ) 325 MG tablet Take 2 tablets (650 mg total) by mouth every 6 (six) hours as needed for mild pain (or Fever >/= 101).   Unknown   amLODipine  (NORVASC ) 10 MG tablet Take 10 mg by mouth daily.   Unknown   apixaban  (ELIQUIS ) 2.5 MG  TABS tablet Take 2.5 mg by mouth 2 (two) times daily.   Unknown   calcitRIOL  (ROCALTROL ) 0.25 MCG capsule Take 1 capsule (0.25 mcg total) by mouth daily. 30 capsule 0 Unknown   calcium  carbonate (TUMS) 500 MG chewable tablet Chew 2 tablets by mouth 3 (three) times daily with meals.   Unknown   cyanocobalamin  (VITAMIN B12) 500 MCG tablet Take 1 tablet (500 mcg total) by mouth daily. 30 tablet 0 Unknown   folic acid  (FOLVITE ) 1 MG tablet Take 1 tablet (1 mg total) by mouth daily. 30 tablet 0 Unknown   furosemide  (LASIX ) 80 MG tablet Take 80 mg by mouth daily.   Unknown   gabapentin  (NEURONTIN ) 300 MG capsule Take 300 mg by mouth at bedtime.  11 Unknown   hydrocortisone  (ANUSOL -HC) 2.5 % rectal cream Place 1 application rectally 2 (two) times daily as needed for hemorrhoids or anal itching.    Unknown   metoprolol  succinate (TOPROL -XL) 100 MG 24 hr tablet Take 1 tablet (100 mg total) by mouth daily with breakfast. Take with or immediately following a meal. 30 tablet 0 Unknown   nitroGLYCERIN  (NITROSTAT ) 0.4 MG SL tablet Place 0.4 mg under the tongue every 5 (five) minutes as needed for chest pain.   Unknown   omeprazole 20 MG TBDD disintegrating tablet Take 20 mg by mouth daily.   Unknown   ondansetron  (ZOFRAN ) 4 MG tablet Take 4 mg by mouth every 8 (eight) hours as needed.   Unknown   pantoprazole  (PROTONIX ) 40 MG tablet Take 1 tablet (40 mg total) by mouth daily. 30 tablet 1 Unknown   promethazine  (PHENERGAN ) 25 MG tablet Take 1 tablet (25 mg total) by mouth every 8 (eight) hours as needed for refractory nausea / vomiting. 20 tablet 0 Unknown   sevelamer  carbonate (RENVELA ) 800 MG tablet Take 800 mg by mouth 3 (three) times daily with meals.   Unknown   sodium bicarbonate  650 MG tablet Take 2 tablets (1,300 mg total) by mouth 2 (two) times daily. 120 tablet 0 Unknown   acetaminophen  (TYLENOL ) 325 MG tablet Take 2 tablets (650 mg total) by mouth every 6 (six) hours as needed for mild pain (pain score 1-3) or fever (or Fever >/= 101).      escitalopram  (LEXAPRO ) 10 MG tablet Take 10 mg by mouth daily. For 360 days (Patient not taking: Reported on 01/23/2024)   Not Taking   furosemide  (LASIX ) 20 MG tablet Take 20 mg by mouth daily. (Patient not taking: Reported on 01/23/2024)   Not Taking   isosorbide  mononitrate (IMDUR ) 30 MG 24 hr tablet Take 1 tablet (30 mg total) by mouth daily as needed (Systolic BP greater than 140). 30 tablet 1    Scheduled:   amLODipine   10 mg Oral Daily   aspirin  EC  81 mg Oral Daily   calcitRIOL   0.25 mcg Oral Daily   calcium  carbonate  2 tablet Oral TID WC   epoetin  alfa-epbx (RETACRIT ) injection  20,000 Units Subcutaneous Weekly   gentamicin  cream  1 Application Topical Daily   isosorbide  mononitrate  120 mg Oral BID   metoprolol  tartrate  75 mg Oral BID    pantoprazole   40 mg Oral Daily   sodium bicarbonate   1,300 mg Oral BID   sodium chloride  flush  3 mL Intravenous Q12H   torsemide   100 mg Oral Daily   Infusions:   dialysis solution 2.5% low-MG/low-CA     heparin  650 Units/hr (01/26/24 1227)   PRN: acetaminophen , hydrALAZINE ,  HYDROmorphone  (DILAUDID ) injection, hydrOXYzine, ondansetron  (ZOFRAN ) IV, sodium chloride  flush, zolpidem  Anti-infectives (From admission, onward)    None       Assessment: 74 year old old male with ESRD on PD MWF, PAF on Eliquis , HTN/chronic HFpEF, HLD, CAD, IIDM, presented with worsening of chest pain and shortness of breath after cutting down on his home PD because he was feeling unwell. Trop 255 > 212. Will use aPTT to monitor heparin .   Last dose apixaban  12/31 ~2200.   Goal of Therapy:  Heparin  level 0.3-0.7 units/ml once aPTT and heparin  level correlate.  aPTT 66-102 seconds Monitor platelets by anticoagulation protocol: Yes  1/1 @ 1842: aPTT 61, SUBtherapeutic   Plan:  Give 850 unit bolus x 1 Increase heparin  infusion rate to 750 units/hour Check aPTT level in 8 hours after rate change and daily while on heparin  Continue to monitor H&H and platelets  Will M. Lenon, PharmD, BCPS Clinical Pharmacist 01/26/2024 7:34 PM     [1]  Allergies Allergen Reactions   Morphine  Anxiety    agitation

## 2024-01-26 NOTE — Progress Notes (Signed)
 " [facility]  ROUNDING NOTE   Subjective:   Casey French is a 74 y.o. male with past medical history of coronary artery disease, hypertension, hyperlipidemia, diabetes mellitus type 2, gout, GERD, ESRD on PD, anemia chronic kidney disease, secondary hyperparathyroidism who presented with chest pain and has been admitted for CHF (congestive heart failure) (HCC) [I50.9] Hypoxia [R09.02]  Patient is known to our practice and receives outpatient treatment through the [facility], supervised by Dr Douglas. He states his last treatment was completed on 12/25.   Update Patient resting quietly Easily aroused Continues to report chest discomfort and tightness, cardiology following  PD UF -7ml   Objective:  Vital signs in last 24 hours:  Temp:  [97.9 F (36.6 C)-98.5 F (36.9 C)] 97.9 F (36.6 C) (01/01 1154) Pulse Rate:  [72-96] 72 (01/01 0330) Resp:  [17-34] 23 (01/01 0330) BP: (111-174)/(53-119) 118/97 (01/01 0330) SpO2:  [95 %-99 %] 99 % (01/01 0330) Weight:  [56.2 kg-61.6 kg] 56.2 kg (01/01 0700)  Weight change: 4.4 kg Filed Weights   01/25/24 0500 01/26/24 0500 01/26/24 0700  Weight: 61.1 kg 61.6 kg 56.2 kg    Intake/Output: I/O last 3 completed shifts: In: 568.7 [P.O.:240; I.V.:335.7] Out: 496 [Urine:250; Other:246]   Intake/Output this shift:  No intake/output data recorded.  Physical Exam: General: NAD  Head: Normocephalic, atraumatic.   Eyes: Anicteric  Lungs:  Diminished, [place] O2  Heart: Regular rate and rhythm  Abdomen:  Soft, nontender, PDC  Extremities:  No peripheral edema.  Neurologic: Awake, alert, conversant  Skin: Warm,dry, no rash  Access: [facility]    Basic Metabolic Panel: Recent Labs  Lab 01/23/24 0527 01/23/24 1232 01/24/24 0453 01/25/24 0357 01/26/24 0036  NA 142 142 140 141 138  K 6.0* 5.9* 4.8 4.4 4.1  CL 112* 112* 108 106 102  CO2 13* 13* 19* 20* 19*  GLUCOSE 82 83 118* 106* 182*  BUN 90* 93* 77* 64* 59*   CREATININE 9.79* 9.94* 8.58* 7.49* 7.12*  CALCIUM  7.6* 7.5* 7.8* 7.4* 7.5*    Liver Function Tests: No results for input(s): AST, ALT, ALKPHOS, BILITOT, PROT, ALBUMIN in the last 168 hours. No results for input(s): LIPASE, AMYLASE in the last 168 hours. No results for input(s): AMMONIA in the last 168 hours.  CBC: Recent Labs  Lab 01/23/24 0527 01/24/24 0453 01/25/24 0357 01/26/24 0036  WBC 9.5 7.0 7.0 9.0  NEUTROABS 7.7  --   --  6.9  HGB 8.7* 7.7* 7.5* 8.5*  HCT 28.2* 24.5* 23.7* 27.0*  MCV 97.9 96.5 93.3 95.4  PLT 239 202 210 248    Cardiac Enzymes: No results for input(s): CKTOTAL, CKMB, CKMBINDEX, TROPONINI in the last 168 hours.  BNP: Invalid input(s): POCBNP  CBG: Recent Labs  Lab 01/26/24 0023  GLUCAP 157*    Microbiology: Results for orders placed or performed in visit on 01/03/24  Microscopic Examination     Status: Abnormal   Collection Time: 01/03/24  1:41 PM   Urine  Result Value Ref Range Status   WBC, UA 0-5 0 - 5 /hpf Final   RBC, Urine 11-30 (A) 0 - 2 /hpf Final   Epithelial Cells (non renal) 0-10 0 - 10 /hpf Final   Casts Present (A) None seen /lpf Final   Cast Type Granular casts (A) N/A Final   Mucus, UA Present (A) Not Estab. Final   Bacteria, UA Few None seen/Few Final    Coagulation Studies: Recent Labs  01/23/24 1232  LABPROT 16.2*  INR 1.2    Urinalysis: No results for input(s): COLORURINE, LABSPEC, PHURINE, GLUCOSEU, HGBUR, BILIRUBINUR, KETONESUR, PROTEINUR, UROBILINOGEN, NITRITE, LEUKOCYTESUR in the last 72 hours.  Invalid input(s): APPERANCEUR    Imaging: DG Chest 1 View Result Date: 01/26/2024 EXAM: 1 VIEW(S) XRAY OF THE CHEST 01/25/2024 10:28:00 PM COMPARISON: 01/23/2024 CLINICAL HISTORY: SOB (shortness of breath) FINDINGS: LUNGS AND PLEURA: Low lung volumes. Bibasilar airspace opacities, likely atelectasis. Small bilateral pleural effusions. No pneumothorax. HEART  AND MEDIASTINUM: Mild cardiomegaly. Aortic arch atherosclerosis. BONES AND SOFT TISSUES: No acute osseous abnormality. IMPRESSION: 1. Low lung volumes with bibasilar airspace opacities, likely atelectasis. 2. Small bilateral pleural effusions. 3. Mild cardiomegaly and aortic arch atherosclerosis. Electronically signed by: Dorethia Molt MD 01/26/2024 02:18 AM EST RP Workstation: [id number]      Medications:    dialysis solution 2.5% low-MG/low-CA     heparin       amLODipine   10 mg Oral Daily   aspirin  EC  81 mg Oral Daily   calcitRIOL   0.25 mcg Oral Daily   calcium  carbonate  2 tablet Oral TID WC   epoetin  alfa-epbx (RETACRIT ) injection  20,000 Units Subcutaneous Weekly   gentamicin  cream  1 Application Topical Daily   heparin   3,000 Units Intravenous Once   isosorbide  mononitrate  120 mg Oral BID   metoprolol  tartrate  75 mg Oral BID   pantoprazole   40 mg Oral Daily   sodium bicarbonate   1,300 mg Oral BID   sodium chloride  flush  3 mL Intravenous Q12H   torsemide   100 mg Oral Daily   acetaminophen , hydrALAZINE , HYDROmorphone  (DILAUDID ) injection, hydrOXYzine, ondansetron  (ZOFRAN ) IV, sodium chloride  flush, zolpidem   Assessment/ Plan:  Casey French is a 74 y.o.  male with past medical history of coronary artery disease, hypertension, hyperlipidemia, diabetes mellitus type 2, gout, GERD, ESRD on PD, anemia chronic kidney disease, secondary hyperparathyroidism who presented with chest pain and has been admitted for CHF (congestive heart failure) (HCC) [I50.9] Hypoxia [R09.02]  [facility]  End stage renal disease on Peritoneal dialysis. Dialysis received overnight, UF -7ml. Will continue to monitor and provide nightly treatments with 2.5% dialysate.   2. Acute respiratory failure, Pulmonary edema noted on CT angio chest. Missed multiple dialysis treatments. Remains on 2L [place], room air at baseline.. Transitioned to Torsemide  100mg  daily.   3. Hyperkalemia, potassium  6.0, due to lack of dialysis. Potassium corrected with dialysis and binders.   4. Anemia of chronic kidney disease Lab Results  Component Value Date   HGB 8.5 (L) 01/26/2024    Continue Retacrit  20000 units weekly    LOS: 3 Casey French 1/1/202612:09 PM   "

## 2024-01-27 ENCOUNTER — Other Ambulatory Visit: Payer: Self-pay

## 2024-01-27 ENCOUNTER — Encounter: Payer: Self-pay | Admitting: Oncology

## 2024-01-27 DIAGNOSIS — R0902 Hypoxemia: Secondary | ICD-10-CM | POA: Diagnosis not present

## 2024-01-27 DIAGNOSIS — I5020 Unspecified systolic (congestive) heart failure: Secondary | ICD-10-CM | POA: Diagnosis not present

## 2024-01-27 DIAGNOSIS — Z992 Dependence on renal dialysis: Secondary | ICD-10-CM

## 2024-01-27 LAB — BASIC METABOLIC PANEL WITH GFR
Anion gap: 12 (ref 5–15)
BUN: 52 mg/dL — ABNORMAL HIGH (ref 8–23)
CO2: 26 mmol/L (ref 22–32)
Calcium: 7.6 mg/dL — ABNORMAL LOW (ref 8.9–10.3)
Chloride: 102 mmol/L (ref 98–111)
Creatinine, Ser: 7.01 mg/dL — ABNORMAL HIGH (ref 0.61–1.24)
GFR, Estimated: 8 mL/min — ABNORMAL LOW
Glucose, Bld: 109 mg/dL — ABNORMAL HIGH (ref 70–99)
Potassium: 3.8 mmol/L (ref 3.5–5.1)
Sodium: 140 mmol/L (ref 135–145)

## 2024-01-27 LAB — CBC
HCT: 23.4 % — ABNORMAL LOW (ref 39.0–52.0)
Hemoglobin: 7.3 g/dL — ABNORMAL LOW (ref 13.0–17.0)
MCH: 29.8 pg (ref 26.0–34.0)
MCHC: 31.2 g/dL (ref 30.0–36.0)
MCV: 95.5 fL (ref 80.0–100.0)
Platelets: 209 K/uL (ref 150–400)
RBC: 2.45 MIL/uL — ABNORMAL LOW (ref 4.22–5.81)
RDW: 16.6 % — ABNORMAL HIGH (ref 11.5–15.5)
WBC: 7 K/uL (ref 4.0–10.5)
nRBC: 0 % (ref 0.0–0.2)

## 2024-01-27 LAB — PREPARE RBC (CROSSMATCH)

## 2024-01-27 LAB — HEPARIN LEVEL (UNFRACTIONATED): Heparin Unfractionated: 0.46 [IU]/mL (ref 0.30–0.70)

## 2024-01-27 LAB — APTT: aPTT: 61 s — ABNORMAL HIGH (ref 24–36)

## 2024-01-27 MED ORDER — APIXABAN 2.5 MG PO TABS
2.5000 mg | ORAL_TABLET | Freq: Two times a day (BID) | ORAL | Status: DC
  Administered 2024-01-27: 2.5 mg via ORAL
  Filled 2024-01-27: qty 1

## 2024-01-27 MED ORDER — ASPIRIN 81 MG PO TBEC
81.0000 mg | DELAYED_RELEASE_TABLET | Freq: Every day | ORAL | 0 refills | Status: AC
  Filled 2024-01-27: qty 30, 30d supply, fill #0

## 2024-01-27 MED ORDER — HEPARIN BOLUS VIA INFUSION
850.0000 [IU] | Freq: Once | INTRAVENOUS | Status: AC
  Administered 2024-01-27: 850 [IU] via INTRAVENOUS
  Filled 2024-01-27: qty 850

## 2024-01-27 MED ORDER — METOPROLOL TARTRATE 50 MG PO TABS
75.0000 mg | ORAL_TABLET | Freq: Two times a day (BID) | ORAL | 0 refills | Status: DC
  Filled 2024-01-27: qty 90, 30d supply, fill #0

## 2024-01-27 MED ORDER — SODIUM CHLORIDE 0.9% IV SOLUTION: Freq: Once | INTRAVENOUS | Status: AC

## 2024-01-27 MED ORDER — ISOSORBIDE MONONITRATE ER 120 MG PO TB24
120.0000 mg | ORAL_TABLET | Freq: Two times a day (BID) | ORAL | 0 refills | Status: AC
  Filled 2024-01-27: qty 60, 30d supply, fill #0

## 2024-01-27 NOTE — Plan of Care (Signed)

## 2024-01-27 NOTE — Progress Notes (Signed)
 Physical Therapy Treatment Patient Details Name: Casey French MRN: [id number] DOB: 1950/12/23 Today's Date: 01/27/2024   History of Present Illness Pt is a 74 year old male admitted with Acute on chronic HFpEF decompensation, Acute hypoxic respiratory failure, hyperkalemia       PMH significant for ESRD on PD MWF, PAF on Eliquis , HTN/chronic HFpEF, HLD, CAD, IIDM    PT Comments  Pt was long sitting in bed upon arrival. A and O x 4. Cooperative and pleasant. Limited session 2/2 to pt's breakfast tray arrived. Pt was on 2L o2 however discontinued during session and placed to rm air. No desaturation. RN staff made aware. Sao2 > 94%. HR slightly tachy but pt endorses no symptoms of distress. He demonstrated safe abilities to exit bed, stand and ambulate short distance without AD. No LOB or safety concerns during mobilization. Pt continues to move well from an acute PT standpoint. Will get mobility team involved for increased OOB/ambulation throughout the day. DC recs remain appropriate. Acute PT will continue to follow per current POC.    If plan is discharge home, recommend the following: Assist for transportation;Help with stairs or ramp for entrance     Equipment Recommendations  None recommended by PT       Precautions / Restrictions Precautions Precautions: Fall Recall of Precautions/Restrictions: Intact Restrictions Weight Bearing Restrictions Per Provider Order: No     Mobility  Bed Mobility Overal bed mobility: Modified Independent  General bed mobility comments: no physical assistance to safely exit bed    Transfers Overall transfer level: Needs assistance Equipment used: None Transfers: Sit to/from Stand Sit to Stand: Supervision  General transfer comment: no physical assist. author only assisted with lines management. pt on on O2 upon arrival however O2 discontinued with pt maintaining > 94% on rm air    Ambulation/Gait Ambulation/Gait assistance: Supervision Gait  Distance (Feet): 12 Feet Assistive device: None Gait Pattern/deviations: Step-through pattern Gait velocity: decreased  General Gait Details: Gait distance limited by breakfast tray arrival. He was easily and safely able to ambulate withotu AD however author discussed possible use of AD at home until baseline strength and abilities return. Author will return later to progress further gait distances.     Balance Overall balance assessment: Needs assistance Sitting-balance support: Feet supported Sitting balance-Leahy Scale: Normal  Standing balance support: No upper extremity supported Standing balance-Leahy Scale: Good       Communication Communication Communication: No apparent difficulties  Cognition Arousal: Alert Behavior During Therapy: WFL for tasks assessed/performed   PT - Cognitive impairments: No apparent impairments      PT - Cognition Comments: Pt is A and O x 4 Following commands: Intact      Cueing Cueing Techniques: Verbal cues         Pertinent Vitals/Pain Pain Assessment Pain Assessment: No/denies pain Faces Pain Scale: No hurt Pain Intervention(s): Monitored during session, Limited activity within patient's tolerance     PT Goals (current goals can now be found in the care plan section) Acute Rehab PT Goals Patient Stated Goal: go home Progress towards PT goals: Progressing toward goals    Frequency    Min 1X/week       AM-PAC PT 6 Clicks Mobility   Outcome Measure  Help needed turning from your back to your side while in a flat bed without using bedrails?: None Help needed moving from lying on your back to sitting on the side of a flat bed without using bedrails?: None Help needed moving  to and from a bed to a chair (including a wheelchair)?: A Little Help needed standing up from a chair using your arms (e.g., wheelchair or bedside chair)?: A Little Help needed to walk in hospital room?: A Little Help needed climbing 3-5 steps with a  railing? : A Little 6 Click Score: 20    End of Session Equipment Utilized During Treatment: Oxygen (discontinued O2 andwas able to maintain > 94% on rm air. RN staff aware post session.) Activity Tolerance: Patient tolerated treatment well;Other (comment) (limited by breakfast tray arrival.) Patient left: in chair;with call bell/phone within reach;with chair alarm set Nurse Communication: Mobility status PT Visit Diagnosis: Muscle weakness (generalized) (M62.81);Unsteadiness on feet (R26.81)     Time: 9164-9154 PT Time Calculation (min) (ACUTE ONLY): 10 min  Charges:    $Therapeutic Activity: 8-22 mins PT General Charges $$ ACUTE PT VISIT: 1 Visit                    Rankin Essex PTA 01/27/2024, 10:18 AM

## 2024-01-27 NOTE — Progress Notes (Incomplete)
 "  PROGRESS NOTE Casey French  FMW:[id number]  DOB: [birth year]  DOA: 01/23/2024 PCP: Valora Lynwood FALCON, MD  Hospital course: Casey French is a 74 year old old male with ESRD on PD MWF, PAF on Eliquis , HTN/chronic HFpEF, HLD, CAD, IIDM, presented with worsening of chest pain and shortness of breath after cutting down on his home PD because he was feeling unwell.  Workup in the ED revealed pulmonary edema on chest x-ray and patient was treated with Lasix  and placed on BiPAP.  Patient was treated with hemodialysis with improvement however developed A-fib with RVR. Elevation in troponin was thought to be type II NSTEMI in setting of increased filling pressures, pulmonary congestion having missed dialysis.  Echocardiogram without any new wall motion abnormalities.  Patient spontaneously converted back to normal sinus rhythm.  Subjective: Patient reports feeling well. Has no abdominal pain or complaints. Respiratory status improved.   Objective: Blood pressure 127/72, pulse 88, temperature 98.3 F (36.8 C), resp. rate (!) 23, height 5' 9 (1.753 m), weight 56.2 kg, SpO2 98%.  Exam: General: Patient sitting up in bed in no distress Eyes: sclera anicteric, conjuctiva mild injection bilaterally CVS: S1-S2, regular  Respiratory: Bilateral CTAB. No wheezing LE: Warm and well-perfused Neuro: A/O x 3 Psych: patient is logical and coherent, judgement and insight appear normal, mood and affect appropriate to situation.  Data Reviewed:  Basic Metabolic Panel: Recent Labs  Lab 01/23/24 1232 01/24/24 0453 01/25/24 0357 01/26/24 0036 01/27/24 0432  NA 142 140 141 138 140  K 5.9* 4.8 4.4 4.1 3.8  CL 112* 108 106 102 102  CO2 13* 19* 20* 19* 26  GLUCOSE 83 118* 106* 182* 109*  BUN 93* 77* 64* 59* 52*  CREATININE 9.94* 8.58* 7.49* 7.12* 7.01*  CALCIUM  7.5* 7.8* 7.4* 7.5* 7.6*    CBC: Recent Labs  Lab 01/23/24 0527 01/24/24 0453 01/25/24 0357 01/26/24 0036 01/27/24 0432   WBC 9.5 7.0 7.0 9.0 7.0  NEUTROABS 7.7  --   --  6.9  --   HGB 8.7* 7.7* 7.5* 8.5* 7.3*  HCT 28.2* 24.5* 23.7* 27.0* 23.4*  MCV 97.9 96.5 93.3 95.4 95.5  PLT 239 202 210 248 209   Assessment & Plan:   SOB secondary to pulmonary edema secondary to missed PD ESRD on PD, missed PD- currently on 3L - wean O2 as tolerated.  - Continue PD per nephrology - PO torsemide  started - palliative was consulted to evaluate and better define GOC  Hyperkalemia- improved with PD  PAF with RVR- rate controlled.  Chest pain- resolved NSTEMI Patient seen by cardiology, troponins are chronically elevated around 250, however cardiology felt this was a type II NSTEMI secondary to increased filling pressures. Patient completed 2 days of heparin  drip, Toprol  and Imdur  were optimized by cardiology. Patient spontaneously converted back into sinus rhythm. Now back on Eliquis  Echocardiogram showed no new wall motion abnormalities Patient is to be discharged home on his usual metoprolol , increased Imdur  and Eliquis   DVT prophylaxis: Eliquis  Code Status: DNR Family Communication: None today  Studies: DG Chest 1 View Result Date: 01/26/2024 EXAM: 1 VIEW(S) XRAY OF THE CHEST 01/25/2024 10:28:00 PM COMPARISON: 01/23/2024 CLINICAL HISTORY: SOB (shortness of breath) FINDINGS: LUNGS AND PLEURA: Low lung volumes. Bibasilar airspace opacities, likely atelectasis. Small bilateral pleural effusions. No pneumothorax. HEART AND MEDIASTINUM: Mild cardiomegaly. Aortic arch atherosclerosis. BONES AND SOFT TISSUES: No acute osseous abnormality. IMPRESSION: 1. Low lung volumes with bibasilar airspace opacities, likely atelectasis. 2. Small bilateral pleural effusions. 3.  Mild cardiomegaly and aortic arch atherosclerosis. Electronically signed by: Dorethia Molt MD 01/26/2024 02:18 AM EST RP Workstation: [id number]    Casey French, [facility]  If 7PM-7AM, please contact night-coverage [email or web address]   LOS: 4  days  "

## 2024-01-27 NOTE — TOC CM/SW Note (Signed)
 Patient has orders to discharge home today. OT recommending a shower chair but he confirmed he has a 3-in-1 at home that he can use. His wife will transport him home. SDOH flags for food and social isolation. Added resources to AVS. CSW signing off.  Lauraine Carpen, CSW [phone]

## 2024-01-27 NOTE — Progress Notes (Signed)
 " Daily Progress Note   Date: 01/27/2024   Patient Name: Casey French  DOB: 25-Oct-1950  MRN: [id number]  Age / Sex: 74 y.o., male  Attending Physician: Lenon Marien CROME, MD Primary Care Physician: Valora Lynwood FALCON, MD Admit Date: 01/23/2024 Length of Stay: 4 days  Reason for Follow-up: Establishing goals of care  Past Medical History:  Diagnosis Date   Acute diverticulitis 06/14/2014   Acute ST elevation myocardial infarction (STEMI) of inferior wall (HCC) 2003   a.) PCI with Cypher stent to the RCA   Anemia of chronic renal failure    Aortic atherosclerosis    Bladder tumor    CAP (community acquired pneumonia) 06/2022   Cerebral microvascular disease    Coronary artery disease involving native coronary artery of native heart without angina pectoris 2003   a.) PCI with Cypher stent to the RCA 2003; b.) LHC 08/2003: 100% dRCA, 60% pLAD - med mgmt; c.) LHC/PCI 08/21/2015: occluded RCA at site of prior stent placement, 70% mLAD (FFR 0.79; 3.5 x 12 mm Xience DES), 50% dLAD (FFR 0.70); d.) MV 11/11/2016: no ischemia   Depression    Diverticulosis    End-stage renal disease on peritoneal dialysis (HCC)    GERD (gastroesophageal reflux disease)    GI bleed 05/15/2019   Gout    Heart murmur    Hematemesis 05/15/2019   Hepatic cyst    Hepatomegaly    Hyperlipidemia    Hypertension    Lacunar infarction (HCC) 02/11/2007   a.) CT head 02/11/2007: lacunar infarction involving thalamus and external capsule region on the RIGHT; b.) MRI brain 02/03/2020: multiple remote lacunar infarcts involving the RIGHT basal ganglia, RIGHT thalamus, and LEFT middle cerebellar peduncle   Long-term use of aspirin  therapy    Myocardial bridge    On apixaban  therapy    Pancolitis 09/2022   Paroxysmal atrial fibrillation (HCC)    a.) CHA2DS2VASc = 6 (age, HTN, CVA x 2, prior MI/vascular disease, T2DM) as of 03/11/2023; b.) rate/rhythm maintained on oral metoprolol  succinate; chronically  anticoagulated with dose reduced apixaban    Renal cyst, right    Sigmoid diverticulitis 03/2022   T2DM (type 2 diabetes mellitus) (HCC)    Traumatic skull fracture and resulting subarachnoid hemorrhage 03/24/2013   a.) MOI was slipping and falling on ice   Unstable angina (HCC)     Subjective:   Subjective: Chart Reviewed. Updates received. Patient Assessed. Created space and opportunity for patient  and family to explore thoughts and feelings regarding current medical situation.  Today's Discussion: Today before meeting with the patient/family, I reviewed the chart notes including nephrology note from yesterday, nursing note from yesterday, nursing notes from today, cardiology note from today, hospitalist note from today, PT note from today. I also reviewed vital signs, nursing flowsheets, medication administrations record, labs, and imaging. Labs reviewed include BMP which shows progressive improvement in BUN/creatinine now down to 52/7.01 in the setting of ongoing episodes of peritoneal dialysis.  CBC does show drop in hemoglobin from 8.5 yesterday to 7.3 today (was 7.5 two days ago).  It appears a type and screen has been ordered by the primary team.  At this point volume status is much improved with ongoing peritoneal dialysis.  Per cardiology patient not very willing to do hemodialysis.  Anticipate discharge in the next 24 to 48 hours with continued stability.  Today saw the patient bedside, no family was present although the patient was sitting in the bedside chair.  He tells me that  he is feeling much better today and that they have indicated he may be able to discharge soon.  He confirms that he does want to go home.  I asked him if he and his family had talk more about possibility of hemodialysis if it becomes necessary.  He says they have but he has not really made a decision yet.  I encouraged him to continue having these conversations, especially with his primary nephrologist that he  really wants to talk to.  I encouraged discussion of what-ifs including if he continues to have chest pains which he want a cardiac cath, if his kidneys progressively got worse and it looks like hemodialysis was necessary would he want that.  My impression is that he will be much more comfortable discussing these with his outpatient primary care team as he has a lot of trust in them.  I shared that with his continued improvement and possible discharge sometime today or over the weekend that palliative medicine would back off at this time as goals are clear and further decisions will need to be made after discussing with outpatient providers.  I shared that we are available if anything changes.  I provided emotional and general support through therapeutic listening, empathy, sharing of stories, and other techniques. I answered all questions and addressed all concerns to the best of my ability.  Review of Systems  Respiratory:  Negative for shortness of breath.   Cardiovascular:  Negative for chest pain.  Gastrointestinal:  Negative for abdominal pain, nausea and vomiting.    Objective:   Primary Diagnoses: Present on Admission:  Acute pulmonary edema (HCC)   Vital Signs:  BP 132/66   Pulse 78   Temp 97.7 F (36.5 C) (Oral)   Resp (!) 27   Ht 5' 9 (1.753 m)   Wt 50.8 kg   SpO2 96%   BMI 16.54 kg/m   Physical Exam Vitals and nursing note reviewed.  Constitutional:      General: He is not in acute distress.    Appearance: He is ill-appearing. He is not toxic-appearing.  HENT:     Head: Normocephalic and atraumatic.  Cardiovascular:     Rate and Rhythm: Normal rate.  Pulmonary:     Effort: Pulmonary effort is normal. No respiratory distress.  Abdominal:     General: Abdomen is flat.     Palpations: Abdomen is soft.  Skin:    General: Skin is warm and dry.  Neurological:     General: No focal deficit present.     Mental Status: He is alert and oriented to person, place,  and time.  Psychiatric:        Mood and Affect: Mood normal.        Behavior: Behavior normal.     Palliative Assessment/Data: 40-50%   Existing Vynca/ACP Documentation: None  Assessment & Plan:   HPI/Patient Profile:  74 y.o. male  with past medical history of ESRD on PD MWF, PAF on Eliquis , HTN/chronic HFpEF, HLD, CAD, IIDM, presented with worsening of chest pain and shortness of breath.  He was admitted on 01/23/2024 with hyperkalemia, acute on chronic HFpEF decompensation, acute hypoxic respiratory failure, anginal-like chest pain, ESRD on PD, and others.    Palliative medicine was consulted for GOC conversations given patient expressing wish to stop peritoneal dialysis.  SUMMARY OF RECOMMENDATIONS   DNR-Limited Continue full scope of care otherwise Anticipate discharge in the next 24 to 48 hours Encouraged patient to continue conversations with primary nephrologist  about what-ifs related to hemodialysis Palliative medicine will continue to follow  Symptom Management:  Per primary team Palliative medicine is available to assist as needed  Code Status: DNR - Limited (DNR/DNI)  Prognosis: Unable to determine  Discharge Planning: Home  Discussed with: Patient, medical team, nursing team  Thank you for allowing us  to participate in the care of Casey French PMT will continue to support holistically.  Time Total: 30 min  Detailed review of medical records (labs, imaging, vital signs), medically appropriate exam, discussed with treatment team, counseling and education to patient, family, & staff, documenting clinical information, medication management, coordination of care  Casey Kays, NP Palliative Medicine Team  Team Phone # [phone] (Nights/Weekends)  09/23/2020, 8:17 AM  "

## 2024-01-27 NOTE — Discharge Summary (Signed)
 "  Physician Discharge Summary  Patient: Casey French FMW:[id number] DOB: May 27, 1950   Code Status: Limited: Do not attempt resuscitation (DNR) -DNR-LIMITED -Do Not Intubate/DNI  Admit date: 01/23/2024 Discharge date: 01/27/2024 Disposition: Home, No home health services recommended PCP: Valora Lynwood FALCON, MD  Recommendations for Outpatient Follow-up:  Follow up with PCP within 1-2 weeks Regarding general hospital follow up and preventative care Recommend CBC to monitor for hgb > goal of 8. S/p 1u pRBC Follow up with nephrology  Follow up with cardiology   Discharge Diagnoses:  Principal Problem:   CHF (congestive heart failure) (HCC) Active Problems:   Acute pulmonary edema (HCC)   Passive suicidal ideations  Brief Hospital Course Summary: Casey French is a 74 year old old male with ESRD on PD MWF, PAF on Eliquis , HTN/chronic HFpEF, HLD, CAD, IIDM. They presented with worsening of chest pain and shortness of breath after missing several PD sessions.  ED course: revealed pulmonary edema on chest x-ray and patient was treated with Lasix  and placed on BiPAP.    Nephrology was consulted and re-initiated PD which patient tolerated well. With fluid removal, he was able to wean off bipap and eventually back to baseline of room air prior to dc.   Palliative was consulted due to concern about the patient stating he did not wish to continue dialysis. They helped to define his GOC and he eventually decided to continue PD and will do so at home going forward.   During his stay, he developed Afib RVR. He was initially started on a heparin  gtt. Cardiology was consulted to manage. He was treated with increased doses of imdur  and metoprolol  as well as fluid removal with HD.  Echocardiogram without any new wall motion abnormalities.  Patient spontaneously converted back to normal sinus rhythm.  He was transitioned to eliquis  and low dose aspirin  prior to dc.   Due to his extensive CAD,  his hgb goal is >8 and had decreased to 7.3 on day of dc. No acute bleeding appreciated but should be monitored since on eliquis . Most likely due to chronic kidney disease. He received one unit of blood prior to dc.   All other chronic conditions were treated with home medications.    Discharge Condition: Good, improved Recommended discharge diet: Regular healthy diet  Consultations: Cardiology  Nephrology   Procedures/Studies: PD Echo  Blood transfusion   Allergies as of 01/27/2024       Reactions   Morphine  Anxiety   agitation        Medication List     STOP taking these medications    escitalopram  10 MG tablet Commonly known as: LEXAPRO    metoprolol  succinate 100 MG 24 hr tablet Commonly known as: TOPROL -XL   pantoprazole  40 MG tablet Commonly known as: Protonix        TAKE these medications    acetaminophen  325 MG tablet Commonly known as: TYLENOL  Take 2 tablets (650 mg total) by mouth every 6 (six) hours as needed for mild pain (or Fever >/= 101). What changed: Another medication with the same name was removed. Continue taking this medication, and follow the directions you see here.   amLODipine  10 MG tablet Commonly known as: NORVASC  Take 10 mg by mouth daily.   apixaban  2.5 MG Tabs tablet Commonly known as: ELIQUIS  Take 2.5 mg by mouth 2 (two) times daily.   aspirin  EC 81 MG tablet Take 1 tablet (81 mg total) by mouth daily. Swallow whole. Start taking on: January 28, 2024  calcitRIOL  0.25 MCG capsule Commonly known as: ROCALTROL  Take 1 capsule (0.25 mcg total) by mouth daily.   folic acid  1 MG tablet Commonly known as: FOLVITE  Take 1 tablet (1 mg total) by mouth daily.   furosemide  80 MG tablet Commonly known as: LASIX  Take 80 mg by mouth daily. What changed: Another medication with the same name was removed. Continue taking this medication, and follow the directions you see here.   gabapentin  300 MG capsule Commonly known as:  NEURONTIN  Take 300 mg by mouth at bedtime.   hydrocortisone  2.5 % rectal cream Commonly known as: ANUSOL -HC Place 1 application rectally 2 (two) times daily as needed for hemorrhoids or anal itching.   isosorbide  mononitrate 120 MG 24 hr tablet Commonly known as: IMDUR  Take 1 tablet (120 mg total) by mouth 2 (two) times daily. What changed:  medication strength how much to take when to take this reasons to take this   Metoprolol  Tartrate 75 MG Tabs Take 1 tablet (75 mg total) by mouth 2 (two) times daily.   nitroGLYCERIN  0.4 MG SL tablet Commonly known as: NITROSTAT  Place 0.4 mg under the tongue every 5 (five) minutes as needed for chest pain.   omeprazole 20 MG Tbdd disintegrating tablet Take 20 mg by mouth daily.   ondansetron  4 MG tablet Commonly known as: ZOFRAN  Take 4 mg by mouth every 8 (eight) hours as needed.   promethazine  25 MG tablet Commonly known as: PHENERGAN  Take 1 tablet (25 mg total) by mouth every 8 (eight) hours as needed for refractory nausea / vomiting.   sevelamer  carbonate 800 MG tablet Commonly known as: RENVELA  Take 800 mg by mouth 3 (three) times daily with meals.   sodium bicarbonate  650 MG tablet Take 2 tablets (1,300 mg total) by mouth 2 (two) times daily.   Tums 500 MG chewable tablet Generic drug: calcium  carbonate Chew 2 tablets by mouth 3 (three) times daily with meals.   vitamin B-12 500 MCG tablet Commonly known as: CYANOCOBALAMIN  Take 1 tablet (500 mcg total) by mouth daily.        Follow-up Information     Clarisa Kung, [place]. Go in 1 week(s).   Contact information: [address] [facility] [place] [address] [phone]                Subjective   Pt reports no complaints. He is tolerating PD well. Denies chest pain or SOB.   All questions and concerns were addressed at time of discharge.  Objective  Blood pressure (!) 111/57, pulse 75, temperature 98.2 F (36.8 C), temperature source Oral,  resp. rate (!) 27, height 5' 9 (1.753 m), weight 50.8 kg, SpO2 97%.   General: Pt is alert, awake, not in acute distress Cardiovascular: RRR, S1/S2 +, no rubs, no gallops Respiratory: CTA bilaterally, no wheezing, no rhonchi Abdominal: Soft, NT, ND, bowel sounds + Extremities: no edema, no cyanosis  The results of significant diagnostics from this hospitalization (including imaging, microbiology, ancillary and laboratory) are listed below for reference.   Imaging studies: DG Chest 1 View Result Date: 01/26/2024 EXAM: 1 VIEW(S) XRAY OF THE CHEST 01/25/2024 10:28:00 PM COMPARISON: 01/23/2024 CLINICAL HISTORY: SOB (shortness of breath) FINDINGS: LUNGS AND PLEURA: Low lung volumes. Bibasilar airspace opacities, likely atelectasis. Small bilateral pleural effusions. No pneumothorax. HEART AND MEDIASTINUM: Mild cardiomegaly. Aortic arch atherosclerosis. BONES AND SOFT TISSUES: No acute osseous abnormality. IMPRESSION: 1. Low lung volumes with bibasilar airspace opacities, likely atelectasis. 2. Small bilateral pleural effusions. 3.  Mild cardiomegaly and aortic arch atherosclerosis. Electronically signed by: Dorethia Molt MD 01/26/2024 02:18 AM EST RP Workstation: [id number]   CT Angio Chest PE W and/or Wo Contrast Result Date: 01/23/2024 EXAM: CTA CHEST 01/23/2024 07:59:59 AM TECHNIQUE: CTA of the chest was performed without and with the administration of 75 mL of intravenous iohexol  (OMNIPAQUE ) 350 MG/ML injection. Multiplanar reformatted images are provided for review. MIP images are provided for review. Automated exposure control, iterative reconstruction, and/or weight based adjustment of the mA/kV was utilized to reduce the radiation dose to as low as reasonably achievable. COMPARISON: 04/22/2023 aortic atherosclerotic calcification. Coronary artery calcifications. CLINICAL HISTORY: hypoxia FINDINGS: PULMONARY ARTERIES: Pulmonary arteries are adequately opacified for evaluation. No acute pulmonary  embolus. Main pulmonary artery is normal in caliber. MEDIASTINUM: Coronary artery calcifications. The pericardium demonstrates no acute abnormality. Aortic atherosclerotic calcification. There is no acute abnormality of the thoracic aorta. LYMPH NODES: Right hilar lymph node has increased in size, measuring 1.3 cm. No mediastinal or axillary lymphadenopathy. LUNGS AND PLEURA: Diffuse interlobular septal thickening is identified along with ground glass attenuation, most likely representing pulmonary edema. Band-like area of consolidative change within the posterior and anterior basal right lower lobe compatible with atelectasis and/or pneumonia. Consolidative change within the posterior left base. Emphysema. Small right pleural effusion. No pneumothorax. UPPER ABDOMEN: Perihepatic and perisplenic ascites noted. SOFT TISSUES AND BONES: No acute or suspicious osseous findings. No acute soft tissue abnormality. IMPRESSION: 1. Diffuse interlobular septal thickening and ground glass attenuation, most likely representing pulmonary edema. 2. Small right pleural effusion. 3. Band-like area of consolidative change within the posterior and anterior basal right lower lobe compatible with atelectasis and/or pneumonia. 4. Consolidative change within the posterior left base, pneumonia versus aspiration vs asymmetric edema. Electronically signed by: Waddell Calk MD 01/23/2024 08:16 AM EST RP Workstation: [id number]   DG Chest 1 View Result Date: 01/23/2024 EXAM: 1 VIEW(S) XRAY OF THE CHEST 01/23/2024 05:35:00 AM COMPARISON: 12/21/2023 CLINICAL HISTORY: 74 year old male with chest pain. FINDINGS: LUNGS AND PLEURA: Pulmonary interstitial prominence, no overt pulmonary edema. Hazy opacity at right lung base, possible airspace consolidation or atelectasis. Small right pleural effusion. No pneumothorax. Mildly lower lung volumes. HEART AND MEDIASTINUM: Cardiomegaly. Calcified aorta. BONES AND SOFT TISSUES: No acute osseous  abnormality. Negative visible bowel gas. IMPRESSION: 1. Lower lung volumes with Right basilar opacity, atelectasis vs infection, probable small effusion. Electronically signed by: Helayne Hurst MD 01/23/2024 05:48 AM EST RP Workstation: [id number]    Labs: Basic Metabolic Panel: Recent Labs  Lab 01/23/24 1232 01/24/24 0453 01/25/24 0357 01/26/24 0036 01/27/24 0432  NA 142 140 141 138 140  K 5.9* 4.8 4.4 4.1 3.8  CL 112* 108 106 102 102  CO2 13* 19* 20* 19* 26  GLUCOSE 83 118* 106* 182* 109*  BUN 93* 77* 64* 59* 52*  CREATININE 9.94* 8.58* 7.49* 7.12* 7.01*  CALCIUM  7.5* 7.8* 7.4* 7.5* 7.6*   CBC: Recent Labs  Lab 01/23/24 0527 01/24/24 0453 01/25/24 0357 01/26/24 0036 01/27/24 0432  WBC 9.5 7.0 7.0 9.0 7.0  NEUTROABS 7.7  --   --  6.9  --   HGB 8.7* 7.7* 7.5* 8.5* 7.3*  HCT 28.2* 24.5* 23.7* 27.0* 23.4*  MCV 97.9 96.5 93.3 95.4 95.5  PLT 239 202 210 248 209   Microbiology: Results for orders placed or performed in visit on 01/03/24  Microscopic Examination     Status: Abnormal   Collection Time: 01/03/24  1:41 PM   Urine  Result Value Ref Range Status  WBC, UA 0-5 0 - 5 /hpf Final   RBC, Urine 11-30 (A) 0 - 2 /hpf Final   Epithelial Cells (non renal) 0-10 0 - 10 /hpf Final   Casts Present (A) None seen /lpf Final   Cast Type Granular casts (A) N/A Final   Mucus, UA Present (A) Not Estab. Final   Bacteria, UA Few None seen/Few Final    Time coordinating discharge: Over 30 minutes  Marien LITTIE Piety, MD  [facility] 01/27/2024, 12:18 PM  "

## 2024-01-27 NOTE — Progress Notes (Signed)
 Occupational Therapy Treatment Patient Details Name: Casey French MRN: [id number] DOB: 1950-03-20 Today's Date: 01/27/2024   History of present illness Pt is a 74 year old male admitted with Acute on chronic HFpEF decompensation, Acute hypoxic respiratory failure, hyperkalemia       PMH significant for ESRD on PD MWF, PAF on Eliquis , HTN/chronic HFpEF, HLD, CAD, IIDM   OT comments  Casey French was seen for OT treatment on this date. Upon arrival to room pt in bed, agreeable to tx. Pt MOD IND bed mobility, SUP sit<>stand x3 no AD use. Reviewed HEP including standing mini squats, calf raises, and marching. Good tolerance for standing functional balance task, no LOB in tandem 10 sec, requires UE support for>5 sec single leg stance. No LOB picking items up from floor. Pt making good progress toward goals, will continue to follow POC. Discharge recommendation remains appropriate.       If plan is discharge home, recommend the following:  A little help with bathing/dressing/bathroom;Assistance with cooking/housework   Equipment Recommendations  None recommended by OT    Recommendations for Other Services      Precautions / Restrictions Precautions Precautions: Fall Recall of Precautions/Restrictions: Intact Restrictions Weight Bearing Restrictions Per Provider Order: No       Mobility Bed Mobility Overal bed mobility: Modified Independent             General bed mobility comments: HOB elevated    Transfers Overall transfer level: Needs assistance Equipment used: None Transfers: Sit to/from Stand Sit to Stand: Supervision                 Balance Overall balance assessment: Needs assistance Sitting-balance support: Feet supported Sitting balance-Leahy Scale: Normal     Standing balance support: No upper extremity supported Standing balance-Leahy Scale: Good                             ADL either performed or assessed with clinical judgement    ADL Overall ADL's : Needs assistance/impaired                                       General ADL Comments: MOD I for LB access in sitting     Communication Communication Communication: No apparent difficulties   Cognition Arousal: Alert Behavior During Therapy: WFL for tasks assessed/performed Cognition: No apparent impairments                               Following commands: Intact                      Pertinent Vitals/ Pain       Pain Assessment Pain Assessment: No/denies pain   Frequency  Min 2X/week        Progress Toward Goals  OT Goals(current goals can now be found in the care plan section)  Progress towards OT goals: Progressing toward goals      AM-PAC OT 6 Clicks Daily Activity     Outcome Measure   Help from another person eating meals?: None Help from another person taking care of personal grooming?: None Help from another person toileting, which includes using toliet, bedpan, or urinal?: None Help from another person bathing (including washing, rinsing, drying)?: A Little Help from another person to put  on and taking off regular upper body clothing?: None Help from another person to put on and taking off regular lower body clothing?: None 6 Click Score: 23    End of Session Equipment Utilized During Treatment: Oxygen  OT Visit Diagnosis: Other abnormalities of gait and mobility (R26.89)   Activity Tolerance Patient tolerated treatment well   Patient Left in bed;with call bell/phone within reach   Nurse Communication          Time: 8666-8657 OT Time Calculation (min): 9 min  Charges: OT General Charges $OT Visit: 1 Visit OT Treatments $Therapeutic Exercise: 8-22 mins  Casey French, M.S. OTR/L  01/27/2024, 1:48 PM  ascom [phone]

## 2024-01-27 NOTE — Progress Notes (Signed)
" °   01/27/24 9295  Peritoneal Catheter Left lower abdomen Continuous ambulatory  Placement Date/Time: 08/13/20 1246   Procedural Verification: Medical records & consent reviewed  Person Inserting LDA: Dr Derrill  Catheter Location: Left lower abdomen  Catheter Conversion: Extension added  Serial / Lot #: [id number]  Dialysis Type: C...  Site Assessment Clean, Dry, Intact  Drainage Description None  Catheter status Deaccessed;Patent  Dressing Gauze/Drain sponge;Occlusive  Dressing Status Clean, Dry, Intact;Clean;Dry  Dressing Intervention Assessed, no intervention needed  Completion  Treatment Status Complete  Initial Drain Volume 20  Average Dwell Time-Hour(s) 60  Average Dwell Time-Min(s) 30  Average Drain Time 20  Total Therapy Volume 9999  Total Therapy Time-Hour(s) 8  Total Therapy Time-Min(s) 5  Weight after Drain 126 lb 1.7 oz (57.2 kg)  Fluid Balance - CCPD  Total UF (+ value on cycler, pt loss) 206 mL  Procedure Comments  Tolerated treatment well? Yes  Peritoneal Dialysis Comments Tx tolerated. Patient has no verbalized concerns. Access clean and dry.  Education / Care Plan  Dialysis Education Provided Yes  Hand-off documentation  Hand-off Given Given to shift RN/LPN  Report given to (Full Name) Nena Pagoda RN  Hand-off Received Received from shift RN/LPN  Report received from (Full Name) Joane Manifold RN    "

## 2024-01-27 NOTE — Progress Notes (Signed)
 [facility]  CARDIOLOGY PROGRESS NOTE  Patient ID: Casey French MRN: [id number] DOB/AGE: May 27, 1950 74 y.o.  Admit date: 01/23/2024 Referring Physician Dr. Lenon Reason for Consultation Heart Failure, elevated troponin  HPI: No overnight events. SOB better, no chest pain  Review of systems complete and found to be negative unless listed above     Past Medical History:  Diagnosis Date   Acute diverticulitis 06/14/2014   Acute ST elevation myocardial infarction (STEMI) of inferior wall (HCC) 2003   a.) PCI with Cypher stent to the RCA   Anemia of chronic renal failure    Aortic atherosclerosis    Bladder tumor    CAP (community acquired pneumonia) 06/2022   Cerebral microvascular disease    Coronary artery disease involving native coronary artery of native heart without angina pectoris 2003   a.) PCI with Cypher stent to the RCA 2003; b.) LHC 08/2003: 100% dRCA, 60% pLAD - med mgmt; c.) LHC/PCI 08/21/2015: occluded RCA at site of prior stent placement, 70% mLAD (FFR 0.79; 3.5 x 12 mm Xience DES), 50% dLAD (FFR 0.70); d.) MV 11/11/2016: no ischemia   Depression    Diverticulosis    End-stage renal disease on peritoneal dialysis (HCC)    GERD (gastroesophageal reflux disease)    GI bleed 05/15/2019   Gout    Heart murmur    Hematemesis 05/15/2019   Hepatic cyst    Hepatomegaly    Hyperlipidemia    Hypertension    Lacunar infarction (HCC) 02/11/2007   a.) CT head 02/11/2007: lacunar infarction involving thalamus and external capsule region on the RIGHT; b.) MRI brain 02/03/2020: multiple remote lacunar infarcts involving the RIGHT basal ganglia, RIGHT thalamus, and LEFT middle cerebellar peduncle   Long-term use of aspirin  therapy    Myocardial bridge    On apixaban  therapy    Pancolitis 09/2022   Paroxysmal atrial fibrillation (HCC)    a.) CHA2DS2VASc = 6 (age, HTN, CVA x 2, prior MI/vascular disease, T2DM) as of 03/11/2023; b.) rate/rhythm maintained on oral  metoprolol  succinate; chronically anticoagulated with dose reduced apixaban    Renal cyst, right    Sigmoid diverticulitis 03/2022   T2DM (type 2 diabetes mellitus) (HCC)    Traumatic skull fracture and resulting subarachnoid hemorrhage 03/24/2013   a.) MOI was slipping and falling on ice   Unstable angina [facility]     Past Surgical History:  Procedure Laterality Date   BLADDER INSTILLATION N/A 04/05/2023   Procedure: BLADDER INSTILLATION OF GEMCITABINE ;  Surgeon: Twylla Glendia BROCKS, MD;  Location: [facility] ORS;  Service: Urology;  Laterality: N/A;   CAPD INSERTION N/A 08/13/2020   Procedure: LAPAROSCOPIC INSERTION CONTINUOUS AMBULATORY PERITONEAL DIALYSIS  (CAPD) CATHETER;  Surgeon: Lane Shope, MD;  Location: [facility] OR[facility];  Service: General;  Laterality: N/A;   CARDIAC CATHETERIZATION     CATARACT EXTRACTION W/PHACO Right 07/05/2019   Procedure: CATARACT EXTRACTION PHACO AND INTRAOCULAR LENS PLACEMENT (IOC) RIGHT DIABETIC;  Surgeon: Jaye Fallow, MD;  Location: [facility];  Service: Ophthalmology;  Laterality: Right;  US00:59.3 CDE8.41 LOT [id number] h   COLONOSCOPY WITH PROPOFOL  N/A 04/01/2017   Procedure: COLONOSCOPY WITH PROPOFOL ;  Surgeon: Gaylyn Gladis PENNER, MD;  Location: [facility];  Service: Endoscopy;  Laterality: N/A;   CORONARY ANGIOPLASTY WITH STENT PLACEMENT  10/2001   RCA   CORONARY ANGIOPLASTY WITH STENT PLACEMENT Left 08/21/2015   Procedure: CORONARY ANGIOPLASTY WITH STENT PLACEMENT (mid LAD); Location: [facility]; Surgeon: Zachary Car, MD   ESOPHAGOGASTRODUODENOSCOPY N/A 04/24/2023   Procedure: EGD (ESOPHAGOGASTRODUODENOSCOPY);  Surgeon: [place],  Teodoro K, MD;  Location: [facility];  Service: Gastroenterology;  Laterality: N/A;   HAND SURGERY  2015   3 screws in right hand   TOTAL HIP ARTHROPLASTY Left 04/22/2020   Procedure: TOTAL HIP ARTHROPLASTY ANTERIOR APPROACH;  Surgeon: Kathlynn Sharper, MD;  Location: [facility];  Service: Orthopedics;  Laterality: Left;   TRANSURETHRAL  RESECTION OF BLADDER TUMOR N/A 04/05/2023   Procedure: TURBT (TRANSURETHRAL RESECTION OF BLADDER TUMOR);  Surgeon: Twylla Glendia BROCKS, MD;  Location: [facility];  Service: Urology;  Laterality: N/A;    Medications Prior to Admission  Medication Sig Dispense Refill Last Dose/Taking   acetaminophen  (TYLENOL ) 325 MG tablet Take 2 tablets (650 mg total) by mouth every 6 (six) hours as needed for mild pain (or Fever >/= 101).   Unknown   amLODipine  (NORVASC ) 10 MG tablet Take 10 mg by mouth daily.   Unknown   apixaban  (ELIQUIS ) 2.5 MG TABS tablet Take 2.5 mg by mouth 2 (two) times daily.   Unknown   calcitRIOL  (ROCALTROL ) 0.25 MCG capsule Take 1 capsule (0.25 mcg total) by mouth daily. 30 capsule 0 Unknown   calcium  carbonate (TUMS) 500 MG chewable tablet Chew 2 tablets by mouth 3 (three) times daily with meals.   Unknown   cyanocobalamin  (VITAMIN B12) 500 MCG tablet Take 1 tablet (500 mcg total) by mouth daily. 30 tablet 0 Unknown   folic acid  (FOLVITE ) 1 MG tablet Take 1 tablet (1 mg total) by mouth daily. 30 tablet 0 Unknown   furosemide  (LASIX ) 80 MG tablet Take 80 mg by mouth daily.   Unknown   gabapentin  (NEURONTIN ) 300 MG capsule Take 300 mg by mouth at bedtime.  11 Unknown   hydrocortisone  (ANUSOL -HC) 2.5 % rectal cream Place 1 application rectally 2 (two) times daily as needed for hemorrhoids or anal itching.   Unknown   metoprolol  succinate (TOPROL -XL) 100 MG 24 hr tablet Take 1 tablet (100 mg total) by mouth daily with breakfast. Take with or immediately following a meal. 30 tablet 0 Unknown   nitroGLYCERIN  (NITROSTAT ) 0.4 MG SL tablet Place 0.4 mg under the tongue every 5 (five) minutes as needed for chest pain.   Unknown   omeprazole 20 MG TBDD disintegrating tablet Take 20 mg by mouth daily.   Unknown   ondansetron  (ZOFRAN ) 4 MG tablet Take 4 mg by mouth every 8 (eight) hours as needed.   Unknown   pantoprazole  (PROTONIX ) 40 MG tablet Take 1 tablet (40 mg total) by mouth daily. 30 tablet 1  Unknown   promethazine  (PHENERGAN ) 25 MG tablet Take 1 tablet (25 mg total) by mouth every 8 (eight) hours as needed for refractory nausea / vomiting. 20 tablet 0 Unknown   sevelamer  carbonate (RENVELA ) 800 MG tablet Take 800 mg by mouth 3 (three) times daily with meals.   Unknown   sodium bicarbonate  650 MG tablet Take 2 tablets (1,300 mg total) by mouth 2 (two) times daily. 120 tablet 0 Unknown   acetaminophen  (TYLENOL ) 325 MG tablet Take 2 tablets (650 mg total) by mouth every 6 (six) hours as needed for mild pain (pain score 1-3) or fever (or Fever >/= 101).      escitalopram  (LEXAPRO ) 10 MG tablet Take 10 mg by mouth daily. For 360 days (Patient not taking: Reported on 01/23/2024)   Not Taking   furosemide  (LASIX ) 20 MG tablet Take 20 mg by mouth daily. (Patient not taking: Reported on 01/23/2024)   Not Taking   isosorbide  mononitrate (IMDUR ) 30 MG  24 hr tablet Take 1 tablet (30 mg total) by mouth daily as needed (Systolic BP greater than 140). 30 tablet 1    Social History   Socioeconomic History   Marital status: Married    Spouse name: Sherri   Number of children: 2   Years of education: Not on file   Highest education level: Not on file  Occupational History   Not on file  Tobacco Use   Smoking status: Former    Current packs/day: 0.00    Types: Cigarettes    Quit date: 03/24/2001    Years since quitting: 22.8   Smokeless tobacco: Never  Vaping Use   Vaping status: Never Used  Substance and Sexual Activity   Alcohol use: No    Alcohol/week: 0.0 standard drinks of alcohol   Drug use: No   Sexual activity: Not on file  Other Topics Concern   Not on file  Social History Narrative   Lives with wife   Social Drivers of Health   Tobacco Use: Medium Risk (01/12/2024)   Received from [facility]   Patient History    Smoking Tobacco Use: Former    Smokeless Tobacco Use: Never    Passive Exposure: Not on file  Financial Resource Strain: Medium Risk  (11/28/2023)   Received from [facility]   Overall Financial Resource Strain (CARDIA)    Difficulty of Paying Living Expenses: Somewhat hard  Food Insecurity: Food Insecurity Present (01/24/2024)   Epic    Worried About Programme Researcher, Broadcasting/film/video in the Last Year: Sometimes true    Ran Out of Food in the Last Year: Never true  Transportation Needs: No Transportation Needs (01/24/2024)   Epic    Lack of Transportation (Medical): No    Lack of Transportation (Non-Medical): No  Physical Activity: Not on file  Stress: Not on file  Social Connections: Socially Isolated (01/24/2024)   Social Connection and Isolation Panel    Frequency of Communication with Friends and Family: Once a week    Frequency of Social Gatherings with Friends and Family: Never    Attends Religious Services: Never    Database Administrator or Organizations: No    Attends Banker Meetings: Never    Marital Status: Married  Catering Manager Violence: Not At Risk (01/24/2024)   Epic    Fear of Current or Ex-Partner: No    Emotionally Abused: No    Physically Abused: No    Sexually Abused: No  Depression (PHQ2-9): Not on file  Alcohol Screen: Not on file  Housing: Low Risk (01/24/2024)   Epic    Unable to Pay for Housing in the Last Year: No    Number of Times Moved in the Last Year: 0    Homeless in the Last Year: No  Utilities: Not At Risk (01/24/2024)   Epic    Threatened with loss of utilities: No  Health Literacy: Not on file    Family History  Problem Relation Age of Onset   Heart disease Mother    Heart disease Father    CAD Other       Review of systems complete and found to be negative unless listed above      PHYSICAL EXAM  Alert and oriented x 3 Grade 2 systolic murmur aortic area No pedal edema No wheeze or crackles  Labs:   Lab Results  Component Value Date   WBC 7.0 01/27/2024   HGB 7.3 (L) 01/27/2024  HCT 23.4 (L) 01/27/2024   MCV 95.5 01/27/2024    PLT 209 01/27/2024    Recent Labs  Lab 01/27/24 0432  NA 140  K 3.8  CL 102  CO2 26  BUN 52*  CREATININE 7.01*  CALCIUM  7.6*  GLUCOSE 109*   Lab Results  Component Value Date   CKTOTAL 198 03/23/2013   CKMB 4.6 (H) 03/23/2013   TROPONINI <0.03 06/26/2018    Lab Results  Component Value Date   CHOL 142 11/10/2016   CHOL 194 09/27/2012   Lab Results  Component Value Date   HDL 45 11/10/2016   HDL 49 09/27/2012   Lab Results  Component Value Date   LDLCALC 75 11/10/2016   LDLCALC 98 09/27/2012   Lab Results  Component Value Date   TRIG 109 11/10/2016   TRIG 233 (H) 09/27/2012   Lab Results  Component Value Date   CHOLHDL 3.2 11/10/2016   No results found for: LDLDIRECT    Radiology: DG Chest 1 View Result Date: 01/26/2024 EXAM: 1 VIEW(S) XRAY OF THE CHEST 01/25/2024 10:28:00 PM COMPARISON: 01/23/2024 CLINICAL HISTORY: SOB (shortness of breath) FINDINGS: LUNGS AND PLEURA: Low lung volumes. Bibasilar airspace opacities, likely atelectasis. Small bilateral pleural effusions. No pneumothorax. HEART AND MEDIASTINUM: Mild cardiomegaly. Aortic arch atherosclerosis. BONES AND SOFT TISSUES: No acute osseous abnormality. IMPRESSION: 1. Low lung volumes with bibasilar airspace opacities, likely atelectasis. 2. Small bilateral pleural effusions. 3. Mild cardiomegaly and aortic arch atherosclerosis. Electronically signed by: Dorethia Molt MD 01/26/2024 02:18 AM EST RP Workstation: [id number]   CT Angio Chest PE W and/or Wo Contrast Result Date: 01/23/2024 EXAM: CTA CHEST 01/23/2024 07:59:59 AM TECHNIQUE: CTA of the chest was performed without and with the administration of 75 mL of intravenous iohexol  (OMNIPAQUE ) 350 MG/ML injection. Multiplanar reformatted images are provided for review. MIP images are provided for review. Automated exposure control, iterative reconstruction, and/or weight based adjustment of the mA/kV was utilized to reduce the radiation dose to as low as  reasonably achievable. COMPARISON: 04/22/2023 aortic atherosclerotic calcification. Coronary artery calcifications. CLINICAL HISTORY: hypoxia FINDINGS: PULMONARY ARTERIES: Pulmonary arteries are adequately opacified for evaluation. No acute pulmonary embolus. Main pulmonary artery is normal in caliber. MEDIASTINUM: Coronary artery calcifications. The pericardium demonstrates no acute abnormality. Aortic atherosclerotic calcification. There is no acute abnormality of the thoracic aorta. LYMPH NODES: Right hilar lymph node has increased in size, measuring 1.3 cm. No mediastinal or axillary lymphadenopathy. LUNGS AND PLEURA: Diffuse interlobular septal thickening is identified along with ground glass attenuation, most likely representing pulmonary edema. Band-like area of consolidative change within the posterior and anterior basal right lower lobe compatible with atelectasis and/or pneumonia. Consolidative change within the posterior left base. Emphysema. Small right pleural effusion. No pneumothorax. UPPER ABDOMEN: Perihepatic and perisplenic ascites noted. SOFT TISSUES AND BONES: No acute or suspicious osseous findings. No acute soft tissue abnormality. IMPRESSION: 1. Diffuse interlobular septal thickening and ground glass attenuation, most likely representing pulmonary edema. 2. Small right pleural effusion. 3. Band-like area of consolidative change within the posterior and anterior basal right lower lobe compatible with atelectasis and/or pneumonia. 4. Consolidative change within the posterior left base, pneumonia versus aspiration vs asymmetric edema. Electronically signed by: Waddell Calk MD 01/23/2024 08:16 AM EST RP Workstation: [id number]   DG Chest 1 View Result Date: 01/23/2024 EXAM: 1 VIEW(S) XRAY OF THE CHEST 01/23/2024 05:35:00 AM COMPARISON: 12/21/2023 CLINICAL HISTORY: 74 year old male with chest pain. FINDINGS: LUNGS AND PLEURA: Pulmonary interstitial prominence, no overt pulmonary edema. Hazy  opacity  at right lung base, possible airspace consolidation or atelectasis. Small right pleural effusion. No pneumothorax. Mildly lower lung volumes. HEART AND MEDIASTINUM: Cardiomegaly. Calcified aorta. BONES AND SOFT TISSUES: No acute osseous abnormality. Negative visible bowel gas. IMPRESSION: 1. Lower lung volumes with Right basilar opacity, atelectasis vs infection, probable small effusion. Electronically signed by: Helayne Hurst MD 01/23/2024 05:48 AM EST RP Workstation: [id number]    ASSESSMENT AND PLAN:  Acute on chronic heart failure with preserved EF NSTEMI more likely type II A-fib with RVR Coronary artery disease with prior stents ESRD on peritoneal dialysis Hypertension, hyperlipidemia  Today patient doing better. SOB improved. No chest pain. Continue aggressive fluid removal with peritoneal dialysis (patient not willing for hemodialysis). Continue current antianginal therapy with amlodipine , increased dose of imdur  and metoprolol .  Will change heparin  drip to low dose eliquis . Low dose aspirin  Goal Hb > 8   Signed: Keller JAYSON Paterson MD 01/27/2024, 7:45 AM

## 2024-01-27 NOTE — Consult Note (Signed)
 PHARMACY - ANTICOAGULATION CONSULT NOTE  Pharmacy Consult for Heparin   Indication: chest pain/ACS  Allergies[1]  Patient Measurements: Height: 5' 9 (175.3 cm) Weight: 50.8 kg (111 lb 15.9 oz) IBW/kg (Calculated) : 70.7 HEPARIN  DW (KG): 57.2  Vital Signs: Temp: 98.3 F (36.8 C) (01/02 0400) Temp Source: Oral (01/02 0400) BP: 150/69 (01/02 0400) Pulse Rate: 82 (01/02 0400)  Labs: Recent Labs    01/24/24 1352 01/24/24 2356 01/25/24 0357 01/25/24 0357 01/26/24 0036 01/26/24 1842 01/27/24 0432  HGB  --   --  7.5*   < > 8.5*  --  7.3*  HCT  --   --  23.7*  --  27.0*  --  23.4*  PLT  --   --  210  --  248  --  209  APTT  --   --   --   --   --  61* 61*  HEPARINUNFRC 0.26* 0.30  --   --   --   --  0.46  CREATININE  --   --  7.49*  --  7.12*  --  7.01*   < > = values in this interval not displayed.    Estimated Creatinine Clearance: 6.8 mL/min (A) (by C-G formula based on SCr of 7.01 mg/dL (H)).   Medical History: Past Medical History:  Diagnosis Date   Acute diverticulitis 06/14/2014   Acute ST elevation myocardial infarction (STEMI) of inferior wall (HCC) 2003   a.) PCI with Cypher stent to the RCA   Anemia of chronic renal failure    Aortic atherosclerosis    Bladder tumor    CAP (community acquired pneumonia) 06/2022   Cerebral microvascular disease    Coronary artery disease involving native coronary artery of native heart without angina pectoris 2003   a.) PCI with Cypher stent to the RCA 2003; b.) LHC 08/2003: 100% dRCA, 60% pLAD - med mgmt; c.) LHC/PCI 08/21/2015: occluded RCA at site of prior stent placement, 70% mLAD (FFR 0.79; 3.5 x 12 mm Xience DES), 50% dLAD (FFR 0.70); d.) MV 11/11/2016: no ischemia   Depression    Diverticulosis    End-stage renal disease on peritoneal dialysis (HCC)    GERD (gastroesophageal reflux disease)    GI bleed 05/15/2019   Gout    Heart murmur    Hematemesis 05/15/2019   Hepatic cyst    Hepatomegaly    Hyperlipidemia     Hypertension    Lacunar infarction (HCC) 02/11/2007   a.) CT head 02/11/2007: lacunar infarction involving thalamus and external capsule region on the RIGHT; b.) MRI brain 02/03/2020: multiple remote lacunar infarcts involving the RIGHT basal ganglia, RIGHT thalamus, and LEFT middle cerebellar peduncle   Long-term use of aspirin  therapy    Myocardial bridge    On apixaban  therapy    Pancolitis 09/2022   Paroxysmal atrial fibrillation (HCC)    a.) CHA2DS2VASc = 6 (age, HTN, CVA x 2, prior MI/vascular disease, T2DM) as of 03/11/2023; b.) rate/rhythm maintained on oral metoprolol  succinate; chronically anticoagulated with dose reduced apixaban    Renal cyst, right    Sigmoid diverticulitis 03/2022   T2DM (type 2 diabetes mellitus) (HCC)    Traumatic skull fracture and resulting subarachnoid hemorrhage 03/24/2013   a.) MOI was slipping and falling on ice   Unstable angina (HCC)     Medications:  Medications Prior to Admission  Medication Sig Dispense Refill Last Dose/Taking   acetaminophen  (TYLENOL ) 325 MG tablet Take 2 tablets (650 mg total) by mouth every 6 (six)  hours as needed for mild pain (or Fever >/= 101).   Unknown   amLODipine  (NORVASC ) 10 MG tablet Take 10 mg by mouth daily.   Unknown   apixaban  (ELIQUIS ) 2.5 MG TABS tablet Take 2.5 mg by mouth 2 (two) times daily.   Unknown   calcitRIOL  (ROCALTROL ) 0.25 MCG capsule Take 1 capsule (0.25 mcg total) by mouth daily. 30 capsule 0 Unknown   calcium  carbonate (TUMS) 500 MG chewable tablet Chew 2 tablets by mouth 3 (three) times daily with meals.   Unknown   cyanocobalamin  (VITAMIN B12) 500 MCG tablet Take 1 tablet (500 mcg total) by mouth daily. 30 tablet 0 Unknown   folic acid  (FOLVITE ) 1 MG tablet Take 1 tablet (1 mg total) by mouth daily. 30 tablet 0 Unknown   furosemide  (LASIX ) 80 MG tablet Take 80 mg by mouth daily.   Unknown   gabapentin  (NEURONTIN ) 300 MG capsule Take 300 mg by mouth at bedtime.  11 Unknown   hydrocortisone   (ANUSOL -HC) 2.5 % rectal cream Place 1 application rectally 2 (two) times daily as needed for hemorrhoids or anal itching.   Unknown   metoprolol  succinate (TOPROL -XL) 100 MG 24 hr tablet Take 1 tablet (100 mg total) by mouth daily with breakfast. Take with or immediately following a meal. 30 tablet 0 Unknown   nitroGLYCERIN  (NITROSTAT ) 0.4 MG SL tablet Place 0.4 mg under the tongue every 5 (five) minutes as needed for chest pain.   Unknown   omeprazole 20 MG TBDD disintegrating tablet Take 20 mg by mouth daily.   Unknown   ondansetron  (ZOFRAN ) 4 MG tablet Take 4 mg by mouth every 8 (eight) hours as needed.   Unknown   pantoprazole  (PROTONIX ) 40 MG tablet Take 1 tablet (40 mg total) by mouth daily. 30 tablet 1 Unknown   promethazine  (PHENERGAN ) 25 MG tablet Take 1 tablet (25 mg total) by mouth every 8 (eight) hours as needed for refractory nausea / vomiting. 20 tablet 0 Unknown   sevelamer  carbonate (RENVELA ) 800 MG tablet Take 800 mg by mouth 3 (three) times daily with meals.   Unknown   sodium bicarbonate  650 MG tablet Take 2 tablets (1,300 mg total) by mouth 2 (two) times daily. 120 tablet 0 Unknown   acetaminophen  (TYLENOL ) 325 MG tablet Take 2 tablets (650 mg total) by mouth every 6 (six) hours as needed for mild pain (pain score 1-3) or fever (or Fever >/= 101).      escitalopram  (LEXAPRO ) 10 MG tablet Take 10 mg by mouth daily. For 360 days (Patient not taking: Reported on 01/23/2024)   Not Taking   furosemide  (LASIX ) 20 MG tablet Take 20 mg by mouth daily. (Patient not taking: Reported on 01/23/2024)   Not Taking   isosorbide  mononitrate (IMDUR ) 30 MG 24 hr tablet Take 1 tablet (30 mg total) by mouth daily as needed (Systolic BP greater than 140). 30 tablet 1    Scheduled:   amLODipine   10 mg Oral Daily   apixaban   2.5 mg Oral BID   aspirin  EC  81 mg Oral Daily   calcitRIOL   0.25 mcg Oral Daily   calcium  carbonate  2 tablet Oral TID WC   epoetin  alfa-epbx (RETACRIT ) injection  20,000  Units Subcutaneous Weekly   gentamicin  cream  1 Application Topical Daily   isosorbide  mononitrate  120 mg Oral BID   metoprolol  tartrate  75 mg Oral BID   pantoprazole   40 mg Oral Daily   sodium bicarbonate   1,300 mg  Oral BID   sodium chloride  flush  3 mL Intravenous Q12H   torsemide   100 mg Oral Daily   Infusions:   dialysis solution 2.5% low-MG/low-CA     heparin  850 Units/hr (01/27/24 0554)   PRN: acetaminophen , hydrALAZINE , HYDROmorphone  (DILAUDID ) injection, hydrOXYzine, ondansetron  (ZOFRAN ) IV, sodium chloride  flush, zolpidem  Anti-infectives (From admission, onward)    None       Assessment: 74 year old old male with ESRD on PD MWF, PAF on Eliquis , HTN/chronic HFpEF, HLD, CAD, IIDM, presented with worsening of chest pain and shortness of breath after cutting down on his home PD because he was feeling unwell. Trop 255 > 212. Will use aPTT to monitor heparin .   Last dose apixaban  12/31 ~2200.  1/2: Per cards eval, NSTEMI likely type II, transition back to apixaban   Plan:  D/C heparin  infusion at 0900 and resume apixaban  2.5 mg bid at that time. Communicated plan to RN. Will f/u TOC plans.  Bari Hamilton D Clinical Pharmacist 01/27/2024 8:03 AM       [1]  Allergies Allergen Reactions   Morphine  Anxiety    agitation

## 2024-01-27 NOTE — Discharge Instructions (Addendum)
 Please review your medications carefully.  Your metoprolol  dose has changed, imdur  was added for your blood pressure control.  You were also started on aspirin  to take in addition to your eliquis .   Please continue on your routine PD.   Follow up with your PCP in 1-2 weeks to recheck your blood work.   Food Resources  Agency Name: [facility] Address: [address] [place], [place] [address] Phone: [phone] Website: [email or web address] Service(s) Offered: Housing services, self-sufficiency, congregate meal program, weatherization program, event organiser program, emergency food assistance,  housing counseling, home ownership program, wheels - to work program.  [employer] free for 60 and older at various locations from usaa, Monday-Friday:  [employer], [address] [place], [phone] -[facility], [address] [place] [phone]  -[facility], [address], [place] [phone]  -[address], [place], [phone]  Agency Name: [facility] Address: [phone] [address], [place], [place] [address] Phone: [phone] Website: [email or web address] Service(s) Offered: Home delivered hot, frozen, and emergency  meals. Grocery assistance program which matches  volunteers one-on-one with seniors unable to grocery shop  for themselves. Must be 60 years and older; less than 20  hours of in-home aide service, limited or no driving ability;  live alone or with someone with a disability; live in  [place].  Agency Name: [facility] [employer]) Address: [address], [place], [place] [address] Phone: [phone] Service(s) Offered: Food is served to shut-ins, homeless, elderly, and low income people in the community every Saturday (11:30 am-12:30 pm) and Sunday (12:30 pm-1:30pm). Volunteers also offer help and  encouragement in seeking employment,  and spiritual guidance.  Agency Name: [employer] Address: [address] [place], [place] [address] Phone: [phone] Service(s) Offered: Child support services; child welfare services; food stamps; Medicaid; work first family assistance; and aid with fuel,  rent, food and medicine.  Agency Name: [employer] Address: [address], [place], [place] Phone: [phone] Website: [email or web address] Services Offered: Monday 10:00am-12:00, 8:00pm-9:00pm, and Friday 10:00am-12:00.  Agency Name: [employer] Address: [address], [place], [place] [address] Phone: [phone] Website: [email or web address] Service(s) Offered: Serves weekday meals, open from 11:30 am- 1:00 pm., and 6:30-7:30pm, Monday-Wednesday-Friday distributes food 3:30-6pm, Monday-Wednesday-Friday.  Agency Name: [facility] Address: [address] [place], [place] Phone: [phone] Website: [email or web address] Services Offered: Distributes food the 4th Saturday of the month, starting at 8:00 am  Agency Name: [facility] Address: [phone] [place], [place] [address] Phone: [phone] Website: [email or web address] Service(s) Offered: Bread of life, weekly food pantry. Open Wednesdays from 10:00am-noon.  Agency Name: [facility] [employer] Address: [address] [place], [place], [place] Phone: [phone] Services Offered: Distributes food 9am-1pm, Monday-Thursday. Call for details.  Agency Name: [facility] Address: [address], [place], [place] [address] Phone: [phone] Website: [email or web address] Service(s) Offered: Games Developer. Call for assistance.  Agency Name: [facility] Address: [address] [place], [place] [address] Phone: [phone] Service Offered: Emergency  Food Pantry. Call for appointment.  Agency Name: [facility] Address: [address], [place], [place] [address] Phone: [phone] Website: [email or web address] Services Offered: Games Developer. Call for details  Agency Name: [facility] Address: [address] [place], [place] Phone: [phone] Website: [email or web address] Service(s) Offered: Emergency Food Pantry. Call for details.  Agency Name: Holiday Representative Address: [phone]  [address], [place], [place] [address] Phone: [phone] or [phone] Website: [email or web address] Service(s) Offered: Distribute food 9am-11:30 am, Tuesday-Friday, and 1-3:30pm, Monday-Friday. Food pantry Monday-Friday 1pm-3pm, fresh items, Mon.-Wed.-Fri.  Agency Name: [facility] Address: [address] [place], [place] [address] Phone: [phone] Website: [email or web address] Services Offered: Distribute food Tues and Sats from 9:00am-noon. Closed 1st Saturday of each month. Call for details  Agency Name: [employer] Address: Fayrene Boatman [facility] [address] [place] [address] Phone: [phone]  Services Offered: Delivers meals every Thursday   Do you feel isolated?  [facility] offers a [employer] that anyone can call toll free at [phone]. The friendship line is available 24 hours a day  [facility] is a Program of All-inclusive Care for the Elderly (PACE). Their mission is to promote and sustain the independence of seniors wishing to remain in the community. They provide seniors with comprehensive long-term health, social, medical and dietary care. Their program is a safe alternative to nursing home care. [phone]  [facility] Address [facility] [address] [place], [place] [address] Phone: [phone]. . Online zoom yoga class, connect with others without leaving your  home [facility] offers [place] dance cardio sessions for individuals via Zoom. This program provides: - Dance fitness activities Please contact program for more information. Servinganyone in need adults 18+ hiv/aids individuals families Call [phone]  Email [email or web address] to get more info  [employer] offers an online [employer] to individuals where they can receive help to focus on their best health. Whether you're a [employer] member or not, the neighborhood center offers a... Main Serviceshealth education  exercise & fitness  community support services  recreation  virtual support Other Servicessupport groups Servinganyone in need adults young adults teens seniors individuals families [email or web address] to get more info  Schedule on their website  [facility] offers an array of activities for adults age 22 and over. This program provides:- Fitness and health programs- Tech classes- Activity books Main Serviceshealth education  community support services  exercise & fitness  recreation  more education Servingseniors  Call [phone]    For more resources go online to [email or web address] and type in you zipcode

## 2024-01-27 NOTE — Consult Note (Signed)
 PHARMACY - ANTICOAGULATION CONSULT NOTE  Pharmacy Consult for Heparin   Indication: chest pain/ACS  Allergies[1]  Patient Measurements: Height: 5' 9 (175.3 cm) Weight: 50.8 kg (111 lb 15.9 oz) IBW/kg (Calculated) : 70.7 HEPARIN  DW (KG): 57.2  Vital Signs: Temp: 98.3 F (36.8 C) (01/02 0400) Temp Source: Oral (01/02 0400) BP: 150/69 (01/02 0400) Pulse Rate: 82 (01/02 0400)  Labs: Recent Labs    01/24/24 1352 01/24/24 2356 01/25/24 0357 01/25/24 0357 01/26/24 0036 01/26/24 1842 01/27/24 0432  HGB  --   --  7.5*   < > 8.5*  --  7.3*  HCT  --   --  23.7*  --  27.0*  --  23.4*  PLT  --   --  210  --  248  --  209  APTT  --   --   --   --   --  61* 61*  HEPARINUNFRC 0.26* 0.30  --   --   --   --  0.46  CREATININE  --   --  7.49*  --  7.12*  --  7.01*   < > = values in this interval not displayed.    Estimated Creatinine Clearance: 6.8 mL/min (A) (by C-G formula based on SCr of 7.01 mg/dL (H)).   Medical History: Past Medical History:  Diagnosis Date   Acute diverticulitis 06/14/2014   Acute ST elevation myocardial infarction (STEMI) of inferior wall (HCC) 2003   a.) PCI with Cypher stent to the RCA   Anemia of chronic renal failure    Aortic atherosclerosis    Bladder tumor    CAP (community acquired pneumonia) 06/2022   Cerebral microvascular disease    Coronary artery disease involving native coronary artery of native heart without angina pectoris 2003   a.) PCI with Cypher stent to the RCA 2003; b.) LHC 08/2003: 100% dRCA, 60% pLAD - med mgmt; c.) LHC/PCI 08/21/2015: occluded RCA at site of prior stent placement, 70% mLAD (FFR 0.79; 3.5 x 12 mm Xience DES), 50% dLAD (FFR 0.70); d.) MV 11/11/2016: no ischemia   Depression    Diverticulosis    End-stage renal disease on peritoneal dialysis (HCC)    GERD (gastroesophageal reflux disease)    GI bleed 05/15/2019   Gout    Heart murmur    Hematemesis 05/15/2019   Hepatic cyst    Hepatomegaly    Hyperlipidemia     Hypertension    Lacunar infarction (HCC) 02/11/2007   a.) CT head 02/11/2007: lacunar infarction involving thalamus and external capsule region on the RIGHT; b.) MRI brain 02/03/2020: multiple remote lacunar infarcts involving the RIGHT basal ganglia, RIGHT thalamus, and LEFT middle cerebellar peduncle   Long-term use of aspirin  therapy    Myocardial bridge    On apixaban  therapy    Pancolitis 09/2022   Paroxysmal atrial fibrillation (HCC)    a.) CHA2DS2VASc = 6 (age, HTN, CVA x 2, prior MI/vascular disease, T2DM) as of 03/11/2023; b.) rate/rhythm maintained on oral metoprolol  succinate; chronically anticoagulated with dose reduced apixaban    Renal cyst, right    Sigmoid diverticulitis 03/2022   T2DM (type 2 diabetes mellitus) (HCC)    Traumatic skull fracture and resulting subarachnoid hemorrhage 03/24/2013   a.) MOI was slipping and falling on ice   Unstable angina (HCC)     Medications:  Medications Prior to Admission  Medication Sig Dispense Refill Last Dose/Taking   acetaminophen  (TYLENOL ) 325 MG tablet Take 2 tablets (650 mg total) by mouth every 6 (six)  hours as needed for mild pain (or Fever >/= 101).   Unknown   amLODipine  (NORVASC ) 10 MG tablet Take 10 mg by mouth daily.   Unknown   apixaban  (ELIQUIS ) 2.5 MG TABS tablet Take 2.5 mg by mouth 2 (two) times daily.   Unknown   calcitRIOL  (ROCALTROL ) 0.25 MCG capsule Take 1 capsule (0.25 mcg total) by mouth daily. 30 capsule 0 Unknown   calcium  carbonate (TUMS) 500 MG chewable tablet Chew 2 tablets by mouth 3 (three) times daily with meals.   Unknown   cyanocobalamin  (VITAMIN B12) 500 MCG tablet Take 1 tablet (500 mcg total) by mouth daily. 30 tablet 0 Unknown   folic acid  (FOLVITE ) 1 MG tablet Take 1 tablet (1 mg total) by mouth daily. 30 tablet 0 Unknown   furosemide  (LASIX ) 80 MG tablet Take 80 mg by mouth daily.   Unknown   gabapentin  (NEURONTIN ) 300 MG capsule Take 300 mg by mouth at bedtime.  11 Unknown   hydrocortisone   (ANUSOL -HC) 2.5 % rectal cream Place 1 application rectally 2 (two) times daily as needed for hemorrhoids or anal itching.   Unknown   metoprolol  succinate (TOPROL -XL) 100 MG 24 hr tablet Take 1 tablet (100 mg total) by mouth daily with breakfast. Take with or immediately following a meal. 30 tablet 0 Unknown   nitroGLYCERIN  (NITROSTAT ) 0.4 MG SL tablet Place 0.4 mg under the tongue every 5 (five) minutes as needed for chest pain.   Unknown   omeprazole 20 MG TBDD disintegrating tablet Take 20 mg by mouth daily.   Unknown   ondansetron  (ZOFRAN ) 4 MG tablet Take 4 mg by mouth every 8 (eight) hours as needed.   Unknown   pantoprazole  (PROTONIX ) 40 MG tablet Take 1 tablet (40 mg total) by mouth daily. 30 tablet 1 Unknown   promethazine  (PHENERGAN ) 25 MG tablet Take 1 tablet (25 mg total) by mouth every 8 (eight) hours as needed for refractory nausea / vomiting. 20 tablet 0 Unknown   sevelamer  carbonate (RENVELA ) 800 MG tablet Take 800 mg by mouth 3 (three) times daily with meals.   Unknown   sodium bicarbonate  650 MG tablet Take 2 tablets (1,300 mg total) by mouth 2 (two) times daily. 120 tablet 0 Unknown   acetaminophen  (TYLENOL ) 325 MG tablet Take 2 tablets (650 mg total) by mouth every 6 (six) hours as needed for mild pain (pain score 1-3) or fever (or Fever >/= 101).      escitalopram  (LEXAPRO ) 10 MG tablet Take 10 mg by mouth daily. For 360 days (Patient not taking: Reported on 01/23/2024)   Not Taking   furosemide  (LASIX ) 20 MG tablet Take 20 mg by mouth daily. (Patient not taking: Reported on 01/23/2024)   Not Taking   isosorbide  mononitrate (IMDUR ) 30 MG 24 hr tablet Take 1 tablet (30 mg total) by mouth daily as needed (Systolic BP greater than 140). 30 tablet 1    Scheduled:   amLODipine   10 mg Oral Daily   aspirin  EC  81 mg Oral Daily   calcitRIOL   0.25 mcg Oral Daily   calcium  carbonate  2 tablet Oral TID WC   epoetin  alfa-epbx (RETACRIT ) injection  20,000 Units Subcutaneous Weekly    gentamicin  cream  1 Application Topical Daily   heparin   850 Units Intravenous Once   isosorbide  mononitrate  120 mg Oral BID   metoprolol  tartrate  75 mg Oral BID   pantoprazole   40 mg Oral Daily   sodium bicarbonate   1,300 mg  Oral BID   sodium chloride  flush  3 mL Intravenous Q12H   torsemide   100 mg Oral Daily   Infusions:   dialysis solution 2.5% low-MG/low-CA     heparin  750 Units/hr (01/26/24 2314)   PRN: acetaminophen , hydrALAZINE , HYDROmorphone  (DILAUDID ) injection, hydrOXYzine, ondansetron  (ZOFRAN ) IV, sodium chloride  flush, zolpidem  Anti-infectives (From admission, onward)    None       Assessment: 74 year old old male with ESRD on PD MWF, PAF on Eliquis , HTN/chronic HFpEF, HLD, CAD, IIDM, presented with worsening of chest pain and shortness of breath after cutting down on his home PD because he was feeling unwell. Trop 255 > 212. Will use aPTT to monitor heparin .   Last dose apixaban  12/31 ~2200.   Goal of Therapy:  Heparin  level 0.3-0.7 units/ml once aPTT and heparin  level correlate.  aPTT 66-102 seconds Monitor platelets by anticoagulation protocol: Yes  1/1 @ 1842: aPTT 61, SUBtherapeutic 1/2 @ 0432: aPTT 61,  HL 0.46   Plan:  1/2 @ 0432:  aPTT = 61,  HL = 0.46 - aPTT subtherapeutic , not correlating with HL  - will order heparin  850 units IV X 1 bolus and increase drip rate to 850 units/hr - recheck aPTT level in 8 hours after rate change - recheck HL on 1/03 with AM labs - use aPTT to guide dosing until correlating with HL  - Continue to monitor H&H and platelets  Laporsha Grealish D Clinical Pharmacist 01/27/2024 5:50 AM      [1]  Allergies Allergen Reactions   Morphine  Anxiety    agitation

## 2024-01-27 NOTE — Progress Notes (Signed)
 " [facility]  ROUNDING NOTE   Subjective:   Casey French is a 74 y.o. male with past medical history of coronary artery disease, hypertension, hyperlipidemia, diabetes mellitus type 2, gout, GERD, ESRD on PD, anemia chronic kidney disease, secondary hyperparathyroidism who presented with chest pain and has been admitted for CHF (congestive heart failure) (HCC) [I50.9] Hypoxia [R09.02]  Patient is known to our practice and receives outpatient treatment through the [facility], supervised by Dr Casey French. He states his last treatment was completed on 12/25.   Update Patient seen laying in bed Denies chest pain Denies shortness of breath  PD UF 206   Objective:  Vital signs in last 24 hours:  Temp:  [97.4 F (36.3 C)-98.3 F (36.8 C)] 97.4 F (36.3 C) (01/02 1316) Pulse Rate:  [69-96] 85 (01/02 1316) Resp:  [15-37] 29 (01/02 1316) BP: (111-150)/(57-81) 126/67 (01/02 1316) SpO2:  [88 %-100 %] 95 % (01/02 1316) Weight:  [50.8 kg] 50.8 kg (01/02 0500)  Weight change: -10.8 kg Filed Weights   01/26/24 0500 01/26/24 0700 01/27/24 0500  Weight: 61.6 kg 56.2 kg 50.8 kg    Intake/Output: I/O last 3 completed shifts: In: 596 [P.O.:600; I.V.:3] Out: 1000 [Urine:1000]   Intake/Output this shift:  Total I/O In: 529.6 [P.O.:240; Blood:289.6] Out: 206 [Other:206]  Physical Exam: General: NAD  Head: Normocephalic, atraumatic.   Eyes: Anicteric  Lungs:  Diminished, [place] O2  Heart: Regular rate and rhythm  Abdomen:  Soft, nontender, PDC  Extremities:  No peripheral edema.  Neurologic: Awake, alert, conversant  Skin: Warm,dry, no rash  Access: [facility]    Basic Metabolic Panel: Recent Labs  Lab 01/23/24 1232 01/24/24 0453 01/25/24 0357 01/26/24 0036 01/27/24 0432  NA 142 140 141 138 140  K 5.9* 4.8 4.4 4.1 3.8  CL 112* 108 106 102 102  CO2 13* 19* 20* 19* 26  GLUCOSE 83 118* 106* 182* 109*  BUN 93* 77* 64* 59* 52*  CREATININE 9.94* 8.58* 7.49*  7.12* 7.01*  CALCIUM  7.5* 7.8* 7.4* 7.5* 7.6*    Liver Function Tests: No results for input(s): AST, ALT, ALKPHOS, BILITOT, PROT, ALBUMIN in the last 168 hours. No results for input(s): LIPASE, AMYLASE in the last 168 hours. No results for input(s): AMMONIA in the last 168 hours.  CBC: Recent Labs  Lab 01/23/24 0527 01/24/24 0453 01/25/24 0357 01/26/24 0036 01/27/24 0432  WBC 9.5 7.0 7.0 9.0 7.0  NEUTROABS 7.7  --   --  6.9  --   HGB 8.7* 7.7* 7.5* 8.5* 7.3*  HCT 28.2* 24.5* 23.7* 27.0* 23.4*  MCV 97.9 96.5 93.3 95.4 95.5  PLT 239 202 210 248 209    Cardiac Enzymes: No results for input(s): CKTOTAL, CKMB, CKMBINDEX, TROPONINI in the last 168 hours.  BNP: Invalid input(s): POCBNP  CBG: Recent Labs  Lab 01/26/24 0023  GLUCAP 157*    Microbiology: Results for orders placed or performed in visit on 01/03/24  Microscopic Examination     Status: Abnormal   Collection Time: 01/03/24  1:41 PM   Urine  Result Value Ref Range Status   WBC, UA 0-5 0 - 5 /hpf Final   RBC, Urine 11-30 (A) 0 - 2 /hpf Final   Epithelial Cells (non renal) 0-10 0 - 10 /hpf Final   Casts Present (A) None seen /lpf Final   Cast Type Granular casts (A) N/A Final   Mucus, UA Present (A) Not Estab. Final   Bacteria, UA Few None seen/Few  Final    Coagulation Studies: No results for input(s): LABPROT, INR in the last 72 hours.   Urinalysis: No results for input(s): COLORURINE, LABSPEC, PHURINE, GLUCOSEU, HGBUR, BILIRUBINUR, KETONESUR, PROTEINUR, UROBILINOGEN, NITRITE, LEUKOCYTESUR in the last 72 hours.  Invalid input(s): APPERANCEUR    Imaging: DG Chest 1 View Result Date: 01/26/2024 EXAM: 1 VIEW(S) XRAY OF THE CHEST 01/25/2024 10:28:00 PM COMPARISON: 01/23/2024 CLINICAL HISTORY: SOB (shortness of breath) FINDINGS: LUNGS AND PLEURA: Low lung volumes. Bibasilar airspace opacities, likely atelectasis. Small bilateral pleural effusions. No  pneumothorax. HEART AND MEDIASTINUM: Mild cardiomegaly. Aortic arch atherosclerosis. BONES AND SOFT TISSUES: No acute osseous abnormality. IMPRESSION: 1. Low lung volumes with bibasilar airspace opacities, likely atelectasis. 2. Small bilateral pleural effusions. 3. Mild cardiomegaly and aortic arch atherosclerosis. Electronically signed by: Dorethia Molt MD 01/26/2024 02:18 AM EST RP Workstation: [id number]      Medications:    dialysis solution 2.5% low-MG/low-CA      amLODipine   10 mg Oral Daily   apixaban   2.5 mg Oral BID   aspirin  EC  81 mg Oral Daily   calcitRIOL   0.25 mcg Oral Daily   calcium  carbonate  2 tablet Oral TID WC   epoetin  alfa-epbx (RETACRIT ) injection  20,000 Units Subcutaneous Weekly   gentamicin  cream  1 Application Topical Daily   isosorbide  mononitrate  120 mg Oral BID   metoprolol  tartrate  75 mg Oral BID   pantoprazole   40 mg Oral Daily   sodium bicarbonate   1,300 mg Oral BID   sodium chloride  flush  3 mL Intravenous Q12H   torsemide   100 mg Oral Daily   acetaminophen , HYDROmorphone  (DILAUDID ) injection, hydrOXYzine, ondansetron  (ZOFRAN ) IV, sodium chloride  flush, zolpidem   Assessment/ Plan:  [name] is a 74 y.o.  male with past medical history of coronary artery disease, hypertension, hyperlipidemia, diabetes mellitus type 2, gout, GERD, ESRD on PD, anemia chronic kidney disease, secondary hyperparathyroidism who presented with chest pain and has been admitted for CHF (congestive heart failure) (HCC) [I50.9] Hypoxia [R09.02]  [facility] [place]  End stage renal disease on Peritoneal dialysis. Dialysis received overnight, UF . Will continue to monitor and provide nightly treatments with 2.5% dialysate.   2. Acute respiratory failure, Pulmonary edema noted on CT angio chest. Weaned to room air. Continue Torsemide  100mg  daily.   3. Hyperkalemia, potassium 6.0, due to lack of dialysis. Potassium corrected with dialysis and binders.   4.  Anemia of chronic kidney disease Lab Results  Component Value Date   HGB 7.3 (L) 01/27/2024    Continue Retacrit  20000 units weekly    LOS: 4 [place] Grayer 1/2/20264:00 PM   "

## 2024-01-28 LAB — TYPE AND SCREEN
ABO/RH(D): O POS
Antibody Screen: NEGATIVE
Unit division: 0

## 2024-01-28 LAB — BPAM RBC
Blood Product Expiration Date: 202601252359
ISSUE DATE / TIME: 202601021040
Unit Type and Rh: 5100

## 2024-01-31 ENCOUNTER — Ambulatory Visit: Payer: Self-pay | Admitting: Urology

## 2024-01-31 LAB — URINALYSIS, COMPLETE
Bilirubin, UA: NEGATIVE
Ketones, UA: NEGATIVE
Leukocytes,UA: NEGATIVE
Nitrite, UA: NEGATIVE
Specific Gravity, UA: 1.015 (ref 1.005–1.030)
Urobilinogen, Ur: 0.2 mg/dL (ref 0.2–1.0)
pH, UA: 8 — ABNORMAL HIGH (ref 5.0–7.5)

## 2024-01-31 LAB — MICROSCOPIC EXAMINATION

## 2024-02-01 ENCOUNTER — Telehealth: Payer: Self-pay

## 2024-02-01 NOTE — Telephone Encounter (Signed)
 Called patient on 02/01/2024 at 11:25 am. to get Patient scheduled for a surveillance cystoscopy in March with Dr. Twylla.

## 2024-02-07 ENCOUNTER — Telehealth: Payer: Self-pay

## 2024-02-07 NOTE — Telephone Encounter (Signed)
 Called patient to get scheduled for a cysto with Dr.stoioff left message to call back.

## 2024-02-07 NOTE — Progress Notes (Signed)
 Patient had cardiogenic pulmonary edema, evidence by Chest x-ray showed pulmonary edema and patient was given IV Lasix  and put on BiPAP.

## 2024-02-09 ENCOUNTER — Telehealth: Payer: Self-pay

## 2024-02-09 NOTE — Telephone Encounter (Signed)
 Called patient on 02/09/2024 at 8:42am to get patient scheduled for cysto with Dr.Stoioff and no answer. Left a message for patient to call back

## 2024-03-01 ENCOUNTER — Observation Stay
Admission: EM | Admit: 2024-03-01 | Discharge: 2024-03-02 | Disposition: A | Source: Home / Self Care | Attending: Family Medicine | Admitting: Family Medicine

## 2024-03-01 ENCOUNTER — Other Ambulatory Visit (HOSPITAL_COMMUNITY): Payer: Self-pay

## 2024-03-01 ENCOUNTER — Other Ambulatory Visit: Payer: Self-pay

## 2024-03-01 ENCOUNTER — Telehealth (HOSPITAL_COMMUNITY): Payer: Self-pay | Admitting: Pharmacy Technician

## 2024-03-01 ENCOUNTER — Emergency Department

## 2024-03-01 ENCOUNTER — Encounter: Payer: Self-pay | Admitting: Emergency Medicine

## 2024-03-01 DIAGNOSIS — I214 Non-ST elevation (NSTEMI) myocardial infarction: Secondary | ICD-10-CM

## 2024-03-01 DIAGNOSIS — F32A Depression, unspecified: Secondary | ICD-10-CM | POA: Insufficient documentation

## 2024-03-01 DIAGNOSIS — E785 Hyperlipidemia, unspecified: Secondary | ICD-10-CM | POA: Diagnosis present

## 2024-03-01 DIAGNOSIS — E876 Hypokalemia: Secondary | ICD-10-CM | POA: Diagnosis present

## 2024-03-01 DIAGNOSIS — E1122 Type 2 diabetes mellitus with diabetic chronic kidney disease: Secondary | ICD-10-CM | POA: Insufficient documentation

## 2024-03-01 DIAGNOSIS — E8770 Fluid overload, unspecified: Secondary | ICD-10-CM | POA: Diagnosis present

## 2024-03-01 DIAGNOSIS — I160 Hypertensive urgency: Secondary | ICD-10-CM | POA: Diagnosis present

## 2024-03-01 DIAGNOSIS — J810 Acute pulmonary edema: Secondary | ICD-10-CM

## 2024-03-01 DIAGNOSIS — R079 Chest pain, unspecified: Secondary | ICD-10-CM | POA: Diagnosis present

## 2024-03-01 DIAGNOSIS — Z992 Dependence on renal dialysis: Secondary | ICD-10-CM

## 2024-03-01 DIAGNOSIS — R9431 Abnormal electrocardiogram [ECG] [EKG]: Secondary | ICD-10-CM

## 2024-03-01 DIAGNOSIS — I249 Acute ischemic heart disease, unspecified: Secondary | ICD-10-CM

## 2024-03-01 DIAGNOSIS — R0602 Shortness of breath: Principal | ICD-10-CM

## 2024-03-01 DIAGNOSIS — N186 End stage renal disease: Secondary | ICD-10-CM

## 2024-03-01 DIAGNOSIS — A0472 Enterocolitis due to Clostridium difficile, not specified as recurrent: Secondary | ICD-10-CM

## 2024-03-01 LAB — COMPREHENSIVE METABOLIC PANEL WITH GFR
ALT: 14 U/L (ref 0–44)
ALT: 12 U/L (ref 0–44)
AST: 31 U/L (ref 15–41)
AST: 28 U/L (ref 15–41)
Albumin: 3.3 g/dL — ABNORMAL LOW (ref 3.5–5.0)
Albumin: 3 g/dL — ABNORMAL LOW (ref 3.5–5.0)
Alkaline Phosphatase: 178 U/L — ABNORMAL HIGH (ref 38–126)
Alkaline Phosphatase: 159 U/L — ABNORMAL HIGH (ref 38–126)
Anion gap: 21 — ABNORMAL HIGH (ref 5–15)
Anion gap: 17 — ABNORMAL HIGH (ref 5–15)
BUN: 69 mg/dL — ABNORMAL HIGH (ref 8–23)
BUN: 65 mg/dL — ABNORMAL HIGH (ref 8–23)
CO2: 13 mmol/L — ABNORMAL LOW (ref 22–32)
CO2: 15 mmol/L — ABNORMAL LOW (ref 22–32)
Calcium: 7 mg/dL — ABNORMAL LOW (ref 8.9–10.3)
Calcium: 6.9 mg/dL — ABNORMAL LOW (ref 8.9–10.3)
Chloride: 108 mmol/L (ref 98–111)
Chloride: 112 mmol/L — ABNORMAL HIGH (ref 98–111)
Creatinine, Ser: 8.46 mg/dL — ABNORMAL HIGH (ref 0.61–1.24)
Creatinine, Ser: 8.24 mg/dL — ABNORMAL HIGH (ref 0.61–1.24)
GFR, Estimated: 6 mL/min — ABNORMAL LOW
GFR, Estimated: 6 mL/min — ABNORMAL LOW
Glucose, Bld: 190 mg/dL — ABNORMAL HIGH (ref 70–99)
Glucose, Bld: 86 mg/dL (ref 70–99)
Potassium: 2.6 mmol/L — CL (ref 3.5–5.1)
Potassium: 3.2 mmol/L — ABNORMAL LOW (ref 3.5–5.1)
Sodium: 143 mmol/L (ref 135–145)
Sodium: 144 mmol/L (ref 135–145)
Total Bilirubin: <0.2 mg/dL (ref 0.0–1.2)
Total Bilirubin: <0.2 mg/dL (ref 0.0–1.2)
Total Protein: 5.9 g/dL — ABNORMAL LOW (ref 6.5–8.1)
Total Protein: 5.4 g/dL — ABNORMAL LOW (ref 6.5–8.1)

## 2024-03-01 LAB — URINALYSIS, ROUTINE W REFLEX MICROSCOPIC
Bacteria, UA: NONE SEEN
Bilirubin Urine: NEGATIVE
Glucose, UA: >=500 mg/dL — AB
Ketones, ur: 5 mg/dL — AB
Leukocytes,Ua: NEGATIVE
Nitrite: NEGATIVE
Protein, ur: >=300 mg/dL — AB
Specific Gravity, Urine: 1.005 (ref 1.005–1.030)
Squamous Epithelial / HPF: 0 /HPF (ref 0–5)
pH: 7 (ref 5.0–8.0)

## 2024-03-01 LAB — CBC WITH DIFFERENTIAL/PLATELET
Abs Immature Granulocytes: 0.12 10*3/uL — ABNORMAL HIGH (ref 0.00–0.07)
Abs Immature Granulocytes: 0.06 10*3/uL (ref 0.00–0.07)
Basophils Absolute: 0 10*3/uL (ref 0.0–0.1)
Basophils Absolute: 0 10*3/uL (ref 0.0–0.1)
Basophils Relative: 0 %
Basophils Relative: 0 %
Eosinophils Absolute: 0.1 10*3/uL (ref 0.0–0.5)
Eosinophils Absolute: 0 10*3/uL (ref 0.0–0.5)
Eosinophils Relative: 1 %
Eosinophils Relative: 0 %
HCT: 28 % — ABNORMAL LOW (ref 39.0–52.0)
HCT: 25.9 % — ABNORMAL LOW (ref 39.0–52.0)
Hemoglobin: 9 g/dL — ABNORMAL LOW (ref 13.0–17.0)
Hemoglobin: 8.4 g/dL — ABNORMAL LOW (ref 13.0–17.0)
Immature Granulocytes: 1 %
Immature Granulocytes: 1 %
Lymphocytes Relative: 5 %
Lymphocytes Relative: 7 %
Lymphs Abs: 0.5 10*3/uL — ABNORMAL LOW (ref 0.7–4.0)
Lymphs Abs: 0.7 10*3/uL (ref 0.7–4.0)
MCH: 29.7 pg (ref 26.0–34.0)
MCH: 30.2 pg (ref 26.0–34.0)
MCHC: 32.1 g/dL (ref 30.0–36.0)
MCHC: 32.4 g/dL (ref 30.0–36.0)
MCV: 92.4 fL (ref 80.0–100.0)
MCV: 93.2 fL (ref 80.0–100.0)
Monocytes Absolute: 0.8 10*3/uL (ref 0.1–1.0)
Monocytes Absolute: 0.8 10*3/uL (ref 0.1–1.0)
Monocytes Relative: 8 %
Monocytes Relative: 8 %
Neutro Abs: 8.4 10*3/uL — ABNORMAL HIGH (ref 1.7–7.7)
Neutro Abs: 8.4 10*3/uL — ABNORMAL HIGH (ref 1.7–7.7)
Neutrophils Relative %: 85 %
Neutrophils Relative %: 84 %
Platelets: 259 10*3/uL (ref 150–400)
Platelets: 220 10*3/uL (ref 150–400)
RBC: 3.03 MIL/uL — ABNORMAL LOW (ref 4.22–5.81)
RBC: 2.78 MIL/uL — ABNORMAL LOW (ref 4.22–5.81)
RDW: 18 % — ABNORMAL HIGH (ref 11.5–15.5)
RDW: 18 % — ABNORMAL HIGH (ref 11.5–15.5)
WBC: 10 10*3/uL (ref 4.0–10.5)
WBC: 10 10*3/uL (ref 4.0–10.5)
nRBC: 0 % (ref 0.0–0.2)
nRBC: 0 % (ref 0.0–0.2)

## 2024-03-01 LAB — HEPATITIS B SURFACE ANTIGEN: Hepatitis B Surface Ag: NONREACTIVE

## 2024-03-01 LAB — LIPASE, BLOOD: Lipase: 49 U/L (ref 11–51)

## 2024-03-01 LAB — TROPONIN T, HIGH SENSITIVITY
Troponin T High Sensitivity: 414 ng/L (ref 0–19)
Troponin T High Sensitivity: 415 ng/L (ref 0–19)
Troponin T High Sensitivity: 322 ng/L (ref 0–19)
Troponin T High Sensitivity: 401 ng/L (ref 0–19)

## 2024-03-01 LAB — APTT
aPTT: 39 s — ABNORMAL HIGH (ref 24–36)
aPTT: 47 s — ABNORMAL HIGH (ref 24–36)

## 2024-03-01 LAB — RESP PANEL BY RT-PCR (RSV, FLU A&B, COVID)  RVPGX2
Influenza A by PCR: NEGATIVE
Influenza B by PCR: NEGATIVE
Resp Syncytial Virus by PCR: NEGATIVE
SARS Coronavirus 2 by RT PCR: NEGATIVE

## 2024-03-01 LAB — PRO BRAIN NATRIURETIC PEPTIDE: Pro Brain Natriuretic Peptide: 32287 pg/mL — ABNORMAL HIGH

## 2024-03-01 LAB — PROTIME-INR
INR: 1.2 (ref 0.8–1.2)
Prothrombin Time: 15.4 s — ABNORMAL HIGH (ref 11.4–15.2)

## 2024-03-01 LAB — HEPARIN LEVEL (UNFRACTIONATED): Heparin Unfractionated: <0.1 [IU]/mL — ABNORMAL LOW (ref 0.30–0.70)

## 2024-03-01 LAB — MAGNESIUM: Magnesium: 1.9 mg/dL (ref 1.7–2.4)

## 2024-03-01 MED ORDER — FUROSEMIDE 10 MG/ML IJ SOLN
60.0000 mg | Freq: Two times a day (BID) | INTRAMUSCULAR | Status: DC
  Administered 2024-03-01 – 2024-03-02 (×3): 60 mg via INTRAVENOUS
  Filled 2024-03-01: qty 6
  Filled 2024-03-01: qty 8
  Filled 2024-03-01: qty 6

## 2024-03-01 MED ORDER — FUROSEMIDE 10 MG/ML IJ SOLN
40.0000 mg | Freq: Once | INTRAMUSCULAR | Status: AC
  Administered 2024-03-01: 40 mg via INTRAVENOUS
  Filled 2024-03-01: qty 4

## 2024-03-01 MED ORDER — METOPROLOL TARTRATE 50 MG PO TABS
75.0000 mg | ORAL_TABLET | Freq: Two times a day (BID) | ORAL | Status: DC
  Administered 2024-03-01 – 2024-03-02 (×3): 75 mg via ORAL
  Filled 2024-03-01 (×2): qty 1
  Filled 2024-03-01: qty 3

## 2024-03-01 MED ORDER — HYDRALAZINE HCL 20 MG/ML IJ SOLN
10.0000 mg | Freq: Four times a day (QID) | INTRAMUSCULAR | Status: DC | PRN
  Administered 2024-03-02: 10 mg via INTRAVENOUS
  Filled 2024-03-01: qty 1

## 2024-03-01 MED ORDER — SODIUM BICARBONATE 650 MG PO TABS
1300.0000 mg | ORAL_TABLET | Freq: Two times a day (BID) | ORAL | Status: DC
  Administered 2024-03-01 – 2024-03-02 (×3): 1300 mg via ORAL
  Filled 2024-03-01 (×3): qty 2

## 2024-03-01 MED ORDER — MAGNESIUM HYDROXIDE 400 MG/5ML PO SUSP: 30.0000 mL | Freq: Every day | ORAL | Status: DC | PRN

## 2024-03-01 MED ORDER — TRAZODONE HCL 50 MG PO TABS: 25.0000 mg | ORAL_TABLET | Freq: Every evening | ORAL | Status: DC | PRN

## 2024-03-01 MED ORDER — LABETALOL HCL 5 MG/ML IV SOLN
20.0000 mg | INTRAVENOUS | Status: DC | PRN
  Administered 2024-03-02: 20 mg via INTRAVENOUS
  Filled 2024-03-01: qty 4

## 2024-03-01 MED ORDER — DELFLEX-LC/2.5% DEXTROSE 394 MOSM/L IP SOLN
INTRAPERITONEAL | Status: DC
  Administered 2024-03-01: 6000 mL via INTRAPERITONEAL
  Filled 2024-03-01 (×2): qty 3000

## 2024-03-01 MED ORDER — PANTOPRAZOLE SODIUM 40 MG IV SOLR
40.0000 mg | Freq: Two times a day (BID) | INTRAVENOUS | Status: DC
  Administered 2024-03-01 – 2024-03-02 (×3): 40 mg via INTRAVENOUS
  Filled 2024-03-01 (×3): qty 10

## 2024-03-01 MED ORDER — ISOSORBIDE MONONITRATE ER 60 MG PO TB24
120.0000 mg | ORAL_TABLET | Freq: Two times a day (BID) | ORAL | Status: DC
  Administered 2024-03-01 – 2024-03-02 (×3): 120 mg via ORAL
  Filled 2024-03-01 (×3): qty 2

## 2024-03-01 MED ORDER — GENTAMICIN SULFATE 0.1 % EX CREA
1.0000 | TOPICAL_CREAM | Freq: Every day | CUTANEOUS | Status: DC
  Administered 2024-03-01 – 2024-03-02 (×2): 1 via TOPICAL
  Filled 2024-03-01 (×2): qty 15

## 2024-03-01 MED ORDER — HEPARIN BOLUS VIA INFUSION
1800.0000 [IU] | Freq: Once | INTRAVENOUS | Status: AC
  Administered 2024-03-01: 1800 [IU] via INTRAVENOUS
  Filled 2024-03-01: qty 1800

## 2024-03-01 MED ORDER — ACETAMINOPHEN 650 MG RE SUPP: 650.0000 mg | Freq: Four times a day (QID) | RECTAL | Status: DC | PRN

## 2024-03-01 MED ORDER — NITROGLYCERIN 0.4 MG SL SUBL: 0.4000 mg | SUBLINGUAL_TABLET | SUBLINGUAL | Status: DC | PRN

## 2024-03-01 MED ORDER — AMLODIPINE BESYLATE 10 MG PO TABS
10.0000 mg | ORAL_TABLET | Freq: Every day | ORAL | Status: DC
  Administered 2024-03-01 – 2024-03-02 (×2): 10 mg via ORAL
  Filled 2024-03-01: qty 1
  Filled 2024-03-01: qty 2

## 2024-03-01 MED ORDER — POTASSIUM CHLORIDE 20 MEQ PO PACK
40.0000 meq | PACK | Freq: Once | ORAL | Status: AC
  Administered 2024-03-01: 40 meq via ORAL
  Filled 2024-03-01: qty 2

## 2024-03-01 MED ORDER — LORAZEPAM 2 MG/ML IJ SOLN
0.5000 mg | Freq: Once | INTRAMUSCULAR | Status: AC
  Administered 2024-03-01: 0.5 mg via INTRAVENOUS
  Filled 2024-03-01: qty 1

## 2024-03-01 MED ORDER — OXYCODONE HCL 5 MG PO TABS
5.0000 mg | ORAL_TABLET | Freq: Four times a day (QID) | ORAL | Status: DC | PRN
  Administered 2024-03-01 (×2): 5 mg via ORAL
  Filled 2024-03-01 (×2): qty 1

## 2024-03-01 MED ORDER — CALCITRIOL 0.25 MCG PO CAPS
0.2500 ug | ORAL_CAPSULE | Freq: Every day | ORAL | Status: DC
  Administered 2024-03-01 – 2024-03-02 (×2): 0.25 ug via ORAL
  Filled 2024-03-01 (×2): qty 1

## 2024-03-01 MED ORDER — ASPIRIN 81 MG PO TBEC
81.0000 mg | DELAYED_RELEASE_TABLET | Freq: Every day | ORAL | Status: DC
  Administered 2024-03-01 – 2024-03-02 (×2): 81 mg via ORAL
  Filled 2024-03-01 (×2): qty 1

## 2024-03-01 MED ORDER — CHLORHEXIDINE GLUCONATE CLOTH 2 % EX PADS
6.0000 | MEDICATED_PAD | Freq: Every day | CUTANEOUS | Status: DC
  Administered 2024-03-01 – 2024-03-02 (×2): 6 via TOPICAL

## 2024-03-01 MED ORDER — METOPROLOL TARTRATE 5 MG/5ML IV SOLN
5.0000 mg | Freq: Once | INTRAVENOUS | Status: AC
  Administered 2024-03-01: 5 mg via INTRAVENOUS
  Filled 2024-03-01: qty 5

## 2024-03-01 MED ORDER — SEVELAMER CARBONATE 800 MG PO TABS
800.0000 mg | ORAL_TABLET | Freq: Three times a day (TID) | ORAL | Status: DC
  Administered 2024-03-01 – 2024-03-02 (×4): 800 mg via ORAL
  Filled 2024-03-01 (×6): qty 1

## 2024-03-01 MED ORDER — CALCIUM CARBONATE ANTACID 500 MG PO CHEW
2.0000 | CHEWABLE_TABLET | Freq: Three times a day (TID) | ORAL | Status: DC
  Administered 2024-03-01 – 2024-03-02 (×5): 400 mg via ORAL
  Filled 2024-03-01 (×5): qty 2

## 2024-03-01 MED ORDER — ASPIRIN 81 MG PO CHEW: 324.0000 mg | CHEWABLE_TABLET | Freq: Once | ORAL | Status: DC

## 2024-03-01 MED ORDER — GABAPENTIN 300 MG PO CAPS
300.0000 mg | ORAL_CAPSULE | Freq: Every day | ORAL | Status: DC
  Administered 2024-03-01: 300 mg via ORAL
  Filled 2024-03-01: qty 1

## 2024-03-01 MED ORDER — POTASSIUM CHLORIDE 10 MEQ/100ML IV SOLN
10.0000 meq | INTRAVENOUS | Status: AC
  Administered 2024-03-01 (×2): 10 meq via INTRAVENOUS
  Filled 2024-03-01 (×2): qty 100

## 2024-03-01 MED ORDER — ACETAMINOPHEN 325 MG PO TABS
650.0000 mg | ORAL_TABLET | Freq: Four times a day (QID) | ORAL | Status: DC | PRN
  Administered 2024-03-02: 650 mg via ORAL
  Filled 2024-03-01: qty 2

## 2024-03-01 MED ORDER — HEPARIN (PORCINE) 25000 UT/250ML-% IV SOLN
950.0000 [IU]/h | INTRAVENOUS | Status: DC
  Administered 2024-03-01: 800 [IU]/h via INTRAVENOUS
  Administered 2024-03-02: 1100 [IU]/h via INTRAVENOUS
  Filled 2024-03-01 (×2): qty 250

## 2024-03-01 MED ORDER — FIDAXOMICIN 200 MG PO TABS
200.0000 mg | ORAL_TABLET | Freq: Two times a day (BID) | ORAL | Status: DC
  Administered 2024-03-01 – 2024-03-02 (×3): 200 mg via ORAL
  Filled 2024-03-01 (×3): qty 1

## 2024-03-01 MED ORDER — FOLIC ACID 1 MG PO TABS
1.0000 mg | ORAL_TABLET | Freq: Every day | ORAL | Status: DC
  Administered 2024-03-01 – 2024-03-02 (×2): 1 mg via ORAL
  Filled 2024-03-01 (×2): qty 1

## 2024-03-01 MED ORDER — FIDAXOMICIN 200 MG PO TABS
200.0000 mg | ORAL_TABLET | Freq: Two times a day (BID) | ORAL | Status: DC
  Filled 2024-03-01: qty 1

## 2024-03-01 MED ORDER — POTASSIUM CHLORIDE 20 MEQ PO PACK: 40.0000 meq | PACK | Freq: Once | ORAL | Status: DC

## 2024-03-01 NOTE — Assessment & Plan Note (Signed)
 Will restart his Dificid 

## 2024-03-01 NOTE — Progress Notes (Signed)
" ° °  Interim no charge progress note   This patient, Casey French is a 74 y.o. male who was admitted by colleague earlier this morning for a.m. overload, chest pain, hypertensive urgency. At the time my evaluation patient reports he is starting to feel better.  He does endorse daily bowel movements multiple times a day.  He reports he has not been taking his Dificid  outpatient as it was cost prohibitive.  We discussed the cost of p.o. vancomycin  at $60 a month and he reports this is something he can afford.  Later in the day I was alerted by RN that patient was in A-fib RVR.  He was given 5 mg metoprolol  IVP and pulse resolved into the 80s.  I reevaluated the patient and he was breathing comfortably and denied any acute chest pain.  He does continue to have significant lab derangements consistent with needing dialysis.  He is scheduled to receive peritoneal dialysis this evening.  Will otherwise resume all home medications. Cardiology has evaluated the patient for chest pain and believes this is secondary to volume overload and denies need for acute intervention.  Continue to monitor on telemetry.  Repeat all labs in a.m.      03/01/2024    3:43 PM 03/01/2024    2:56 PM 03/01/2024   11:35 AM  Vitals with BMI  Systolic 154 135 840  Diastolic 97 82 104  Pulse 94 92 59       Latest Ref Rng & Units 03/01/2024   10:55 AM 03/01/2024    3:43 AM 01/27/2024    4:32 AM  CBC  WBC 4.0 - 10.5 K/uL 10.0  10.0  7.0   Hemoglobin 13.0 - 17.0 g/dL 8.4  9.0  7.3   Hematocrit 39.0 - 52.0 % 25.9  28.0  23.4   Platelets 150 - 400 K/uL 220  259  209        Latest Ref Rng & Units 03/01/2024   10:55 AM 03/01/2024    3:43 AM 01/27/2024    4:32 AM  BMP  Glucose 70 - 99 mg/dL 86  809  890   BUN 8 - 23 mg/dL 65  69  52   Creatinine 0.61 - 1.24 mg/dL 1.75  1.53  2.98   Sodium 135 - 145 mmol/L 144  143  140   Potassium 3.5 - 5.1 mmol/L 3.2  2.6  3.8   Chloride 98 - 111 mmol/L 112  108  102   CO2 22 - 32 mmol/L  15  13  26    Calcium  8.9 - 10.3 mg/dL 6.9  7.0  7.6      "

## 2024-03-01 NOTE — Assessment & Plan Note (Signed)
-   He has associated elevated troponin concerning for ACS. - He will be continued on IV heparin . - 2D echo and cardiology consult will be obtained. - I notified Dr. Florencio about the patient.

## 2024-03-01 NOTE — Assessment & Plan Note (Addendum)
-   This is associated with end-stage renal disease on peritoneal dialysis. - His pulmonary edema is likely secondary to noncompliance with PD. - He will be admitted to a progressive unit bed. - Nephrology consult to be obtained. - Dr. Douglas was notified about the patient. 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 - Will follow LFTs.

## 2024-03-01 NOTE — Progress Notes (Signed)
 ANTICOAGULATION CONSULT NOTE  Pharmacy Consult for heparin  infusion Indication: ACS/STEMI  Allergies[1]  Patient Measurements: Height: 5' 9 (175.3 cm) Weight: 59 kg (130 lb) IBW/kg (Calculated) : 70.7 HEPARIN  DW (KG): 59  Vital Signs: Temp: 98.5 F (36.9 C) (02/05 1543) Temp Source: Oral (02/05 1456) BP: 154/97 (02/05 1543) Pulse Rate: 94 (02/05 1543)  Labs: Recent Labs    03/01/24 0342 03/01/24 0343 03/01/24 1055 03/01/24 1520  HGB  --  9.0* 8.4*  --   HCT  --  28.0* 25.9*  --   PLT  --  259 220  --   APTT 39*  --   --  47*  LABPROT 15.4*  --   --   --   INR 1.2  --   --   --   HEPARINUNFRC <0.10*  --   --   --   CREATININE  --  8.46* 8.24*  --     Estimated Creatinine Clearance: 6.8 mL/min (A) (by C-G formula based on SCr of 8.24 mg/dL (H)).   Medical History: Past Medical History:  Diagnosis Date   Acute diverticulitis 06/14/2014   Acute ST elevation myocardial infarction (STEMI) of inferior wall (HCC) 2003   a.) PCI with Cypher stent to the RCA   Anemia of chronic renal failure    Aortic atherosclerosis    Bladder tumor    CAP (community acquired pneumonia) 06/2022   Cerebral microvascular disease    Coronary artery disease involving native coronary artery of native heart without angina pectoris 2003   a.) PCI with Cypher stent to the RCA 2003; b.) LHC 08/2003: 100% dRCA, 60% pLAD - med mgmt; c.) LHC/PCI 08/21/2015: occluded RCA at site of prior stent placement, 70% mLAD (FFR 0.79; 3.5 x 12 mm Xience DES), 50% dLAD (FFR 0.70); d.) MV 11/11/2016: no ischemia   Depression    Diverticulosis    End-stage renal disease on peritoneal dialysis (HCC)    GERD (gastroesophageal reflux disease)    GI bleed 05/15/2019   Gout    Heart murmur    Hematemesis 05/15/2019   Hepatic cyst    Hepatomegaly    Hyperlipidemia    Hypertension    Lacunar infarction (HCC) 02/11/2007   a.) CT head 02/11/2007: lacunar infarction involving thalamus and external capsule region  on the RIGHT; b.) MRI brain 02/03/2020: multiple remote lacunar infarcts involving the RIGHT basal ganglia, RIGHT thalamus, and LEFT middle cerebellar peduncle   Long-term use of aspirin  therapy    Myocardial bridge    On apixaban  therapy    Pancolitis 09/2022   Paroxysmal atrial fibrillation (HCC)    a.) CHA2DS2VASc = 6 (age, HTN, CVA x 2, prior MI/vascular disease, T2DM) as of 03/11/2023; b.) rate/rhythm maintained on oral metoprolol  succinate; chronically anticoagulated with dose reduced apixaban    Renal cyst, right    Sigmoid diverticulitis 03/2022   T2DM (type 2 diabetes mellitus) (HCC)    Traumatic skull fracture and resulting subarachnoid hemorrhage 03/24/2013   a.) MOI was slipping and falling on ice   Unstable angina (HCC)     Medications:  PTA Meds: Apixaban  2.5 mg BID, last does unknown  Assessment: Pt is a 74 yo male on home peritoneal dialysis presenting to ED c/o SOB and chest tightness found with elevated troponin and BNP levels.  Baseline HL <0.10  and aPTT 39 on 2/5 @ 0649  Goal of Therapy:  Heparin  level 0.3-0.7 units/ml aPTT 66-102 seconds Monitor platelets by anticoagulation protocol: Yes   Date Time  aPTT Rate/Comment  02/05 1520 47 Subtherapeutic @ 800 units/hr (gtt paused for ~1 hour around noon)  Plan:  aPTT is subtherapeutic Will give 1,800 unit bolus anf increase heparin  infusion to 950 units/hr Will follow aPTT until correlation w/ HL confirmed Will check aPTT in 8 hr after infusion rate change HL & CBC daily while on heparin   Annabella LOISE Banks, PharmD Clinical Pharmacist 03/01/2024 4:14 PM          [1]  Allergies Allergen Reactions   Morphine  Anxiety    agitation

## 2024-03-01 NOTE — Progress Notes (Signed)
 " [facility]  ROUNDING NOTE   Subjective:   Casey French is a 74 y.o. male with past medical history of coronary artery disease, hypertension, hyperlipidemia, diabetes mellitus type 2, gout, GERD, ESRD on PD, anemia chronic kidney disease, secondary hyperparathyroidism who presented with shortness of breath and chest tightness. He has been admitted for Fluid overload [E87.70]  Patient is known to our practice and receives outpatient treatment through the [facility]nic, supervised by Dr Douglas. Last treatment received on Monday. Patient seen resting on stretcher, ill appearing. Denies pain or chest discomfort at this time. 2L [place] in place. Also complains of nausea and vomiting since Tuesday.   Labs on Ed arrival concerning for potassium 2.6, s bicar 13, BUN 69, BNP greater than 32000, troponin 141 and Hgb 9.0. Chest xray shows vascular congestion with mild interstitial edema and bilateral small pleural effusions.   We have been consulted to manage peritoneal dialysis during this admission.     Objective:  Vital signs in last 24 hours:  Temp:  [94.4 F (34.7 C)-98.4 F (36.9 C)] 98.1 F (36.7 C) (02/05 1456) Pulse Rate:  [51-95] 92 (02/05 1456) Resp:  [21-31] 28 (02/05 1456) BP: (133-174)/(82-108) 135/82 (02/05 1456) SpO2:  [92 %-97 %] 97 % (02/05 1456) Weight:  [59 kg] 59 kg (02/05 0337)  Weight change:  Filed Weights   03/01/24 0337  Weight: 59 kg    Intake/Output: No intake/output data recorded.   Intake/Output this shift:  Total I/O In: 61.5 [I.V.:61.5] Out: -   Physical Exam: General: NAD  Head: Normocephalic, atraumatic. Moist oral mucosal membranes  Eyes: Anicteric  Lungs:  Diminished [place] O2  Heart: Regular rate and rhythm  Abdomen:  Soft, nontender  Extremities:  No peripheral edema.  Neurologic: Awake, alert, conversant  Skin: Warm,dry, no rash  Access: [facility]    Basic Metabolic Panel: Recent Labs  Lab 03/01/24 0343 03/01/24 1055   NA 143 144  K 2.6* 3.2*  CL 108 112*  CO2 13* 15*  GLUCOSE 190* 86  BUN 69* 65*  CREATININE 8.46* 8.24*  CALCIUM  7.0* 6.9*  MG 1.9  --     Liver Function Tests: Recent Labs  Lab 03/01/24 0343 03/01/24 1055  AST 31 28  ALT 14 12  ALKPHOS 178* 159*  BILITOT <0.2 <0.2  PROT 5.9* 5.4*  ALBUMIN 3.3* 3.0*   Recent Labs  Lab 03/01/24 0343  LIPASE 49   No results for input(s): AMMONIA in the last 168 hours.  CBC: Recent Labs  Lab 03/01/24 0343 03/01/24 1055  WBC 10.0 10.0  NEUTROABS 8.4* 8.4*  HGB 9.0* 8.4*  HCT 28.0* 25.9*  MCV 92.4 93.2  PLT 259 220    Cardiac Enzymes: No results for input(s): CKTOTAL, CKMB, CKMBINDEX, TROPONINI in the last 168 hours.  BNP: Invalid input(s): POCBNP  CBG: No results for input(s): GLUCAP in the last 168 hours.  Microbiology: Results for orders placed or performed during the hospital encounter of 03/01/24  Resp panel by RT-PCR (RSV, Flu A&B, Covid) Anterior Nasal Swab     Status: None   Collection Time: 03/01/24  3:43 AM   Specimen: Anterior Nasal Swab  Result Value Ref Range Status   SARS Coronavirus 2 by RT PCR NEGATIVE NEGATIVE Final    Comment: (NOTE) SARS-CoV-2 target nucleic acids are NOT DETECTED.  The SARS-CoV-2 RNA is generally detectable in upper respiratory specimens during the acute phase of infection. The lowest concentration of SARS-CoV-2 viral copies this assay  can detect is 138 copies/mL. A negative result does not preclude SARS-Cov-2 infection and should not be used as the sole basis for treatment or other patient management decisions. A negative result may occur with  improper specimen collection/handling, submission of specimen other than nasopharyngeal swab, presence of viral mutation(s) within the areas targeted by this assay, and inadequate number of viral copies(<138 copies/mL). A negative result must be combined with clinical observations, patient history, and  epidemiological information. The expected result is Negative.  Fact Sheet for Patients:  bloggercourse.com  Fact Sheet for Healthcare Providers:  seriousbroker.it  This test is no t yet approved or cleared by the [place]  FDA and  has been authorized for detection and/or diagnosis of SARS-CoV-2 by FDA under an Emergency Use Authorization (EUA). This EUA will remain  in effect (meaning this test can be used) for the duration of the COVID-19 declaration under Section 564(b)(1) of the Act, 21 U.S.C.section 360bbb-3(b)(1), unless the authorization is terminated  or revoked sooner.       Influenza A by PCR NEGATIVE NEGATIVE Final   Influenza B by PCR NEGATIVE NEGATIVE Final    Comment: (NOTE) The Xpert Xpress SARS-CoV-2/FLU/RSV plus assay is intended as an aid in the diagnosis of influenza from Nasopharyngeal swab specimens and should not be used as a sole basis for treatment. Nasal washings and aspirates are unacceptable for Xpert Xpress SARS-CoV-2/FLU/RSV testing.  Fact Sheet for Patients: bloggercourse.com  Fact Sheet for Healthcare Providers: seriousbroker.it  This test is not yet approved or cleared by the [place]  FDA and has been authorized for detection and/or diagnosis of SARS-CoV-2 by FDA under an Emergency Use Authorization (EUA). This EUA will remain in effect (meaning this test can be used) for the duration of the COVID-19 declaration under Section 564(b)(1) of the Act, 21 U.S.C. section 360bbb-3(b)(1), unless the authorization is terminated or revoked.     Resp Syncytial Virus by PCR NEGATIVE NEGATIVE Final    Comment: (NOTE) Fact Sheet for Patients: bloggercourse.com  Fact Sheet for Healthcare Providers: seriousbroker.it  This test is not yet approved or cleared by the [place]  FDA and has been  authorized for detection and/or diagnosis of SARS-CoV-2 by FDA under an Emergency Use Authorization (EUA). This EUA will remain in effect (meaning this test can be used) for the duration of the COVID-19 declaration under Section 564(b)(1) of the Act, 21 U.S.C. section 360bbb-3(b)(1), unless the authorization is terminated or revoked.  Performed at [facility], [address] [place], [place] [address]     Coagulation Studies: Recent Labs    03/01/24 0342  LABPROT 15.4*  INR 1.2    Urinalysis: Recent Labs    03/01/24 0400  COLORURINE COLORLESS*  LABSPEC 1.005  PHURINE 7.0  GLUCOSEU >=500*  HGBUR SMALL*  BILIRUBINUR NEGATIVE  KETONESUR 5*  PROTEINUR >=300*  NITRITE NEGATIVE  LEUKOCYTESUR NEGATIVE      Imaging: DG Chest Portable 1 View Result Date: 03/01/2024 EXAM: 1 VIEW(S) XRAY OF THE CHEST 03/01/2024 04:02:00 AM COMPARISON: Portable chest 01/25/2024. CLINICAL HISTORY: SOB Shortness of breath. FINDINGS: LINES, TUBES AND DEVICES: Telemetry leads overlie the chest. LUNGS AND PLEURA: There are bilateral small pleural effusions with improvement compared with 01/25/2024. Development of mild interstitial edema in the lung bases. There are low lung volumes, continued patchy atelectasis or airspace disease in the hypoinflated lung bases. No pneumothorax. HEART AND MEDIASTINUM: Stable cardiomegaly. There is increased central vascular congestion. Mediastinum is stable with aortic atherosclerosis. BONES AND SOFT TISSUES: Osteopenia and  degenerative change. IMPRESSION: 1. Increased central vascular congestion and development of mild interstitial edema in the lung bases. 2. Bilateral small pleural effusions, improved compared to 01/25/24. 3. Low inspiration with continued patchy atelectasis or infiltrates in the hypoinflated bases. 4. Stable cardiomegaly. Electronically signed by: Francis Quam MD 03/01/2024 04:15 AM EST RP Workstation: [id number]     Medications:     dialysis solution 2.5% low-MG/low-CA     heparin  800 Units/hr (03/01/24 1423)    amLODipine   10 mg Oral Daily   aspirin  EC  81 mg Oral Daily   calcitRIOL   0.25 mcg Oral Daily   calcium  carbonate  2 tablet Oral TID WC   fidaxomicin   200 mg Oral BID   folic acid   1 mg Oral Daily   furosemide   60 mg Intravenous Q12H   gabapentin   300 mg Oral QHS   gentamicin  cream  1 Application Topical Daily   isosorbide  mononitrate  120 mg Oral BID   metoprolol  tartrate  75 mg Oral BID   pantoprazole  (PROTONIX ) IV  40 mg Intravenous Q12H   potassium chloride   40 mEq Oral Once   sevelamer  carbonate  800 mg Oral TID WC   sodium bicarbonate   1,300 mg Oral BID   acetaminophen  **OR** acetaminophen , hydrALAZINE , labetalol , magnesium  hydroxide, nitroGLYCERIN , oxyCODONE , traZODone   Assessment/ Plan:  Casey French is a 74 y.o.  male  with past medical history of coronary artery disease, hypertension, hyperlipidemia, diabetes mellitus type 2, gout, GERD, ESRD on PD, anemia chronic kidney disease, secondary hyperparathyroidism who presented with SOB and has been admitted for Shortness of breath [R06.02] Hypokalemia [E87.6] Acute pulmonary edema (HCC) [J81.0] Prolonged Q-T interval on ECG [R94.31] C. difficile diarrhea [A04.72] NSTEMI (non-ST elevated myocardial infarction) (HCC) [I21.4] Fluid overload [E87.70] Chest pain, unspecified type [R07.9]   End stage renal disease on peritoneal dialysis.  Last treatment received on Monday.  Will continue nightly treatments.  Due to shortness of breath, will utilize 2.5% dialysate for fluid removal.  2.  Hypokalemia, likely secondary to vomiting, GI losses potassium 2.6 on ED arrival..  IV and oral supplementation ordered  3.  Volume overload, pulmonary edema seen on chest x-ray.  Last PD treatment on Monday.  Will proceed with nightly treatments to facilitate fluid removal.  Agree with current IV furosemide  60 mg twice daily.  4. Anemia of chronic kidney  disease Lab Results  Component Value Date   HGB 8.4 (L) 03/01/2024    Hemoglobin below optimal range.  Patient does receive Mircera at outpatient clinic.  5.  Secondary Hyperparathyroidism: with outpatient labs: None available Lab Results  Component Value Date   CALCIUM  6.9 (L) 03/01/2024   CAION 0.95 (L) 04/05/2023   PHOS 7.2 (H) 12/29/2023    Will continue to monitor bone minerals during this admission.  6.  Hypertensive urgency.  Blood pressure 133/108 on ED arrival.  Currently prescribed amlodipine , furosemide , isosorbide , and metoprolol .  Patient received additional IV 5 mg metoprolol  due to increased heart rate with chest tightness.   LOS: 0 Chanese Hartsough 2/5/20263:34 PM   "

## 2024-03-01 NOTE — ED Notes (Signed)
 Pt's HR is currently in the range of 80's-90's.

## 2024-03-01 NOTE — Consult Note (Cosign Needed)
 " [facility] CARDIOLOGY CONSULT NOTE       Patient ID: Casey French MRN: [id number] DOB/AGE: May 15, 1950 74 y.o.  Admit date: 03/01/2024 Referring Physician Dr. Madison Peaches Primary Physician Valora Lynwood FALCON, MD  Primary Cardiologist Therisa Pierre, PA  Reason for Consultation AoCHF, elevated troponin  HPI: Casey French is a 74 y.o. male  with a past medical history of coronary artery disease s/p multiple stents most recently 2017 @ [facility], paroxysmal atrial fibrillation, hypertension, hyperlipidemia, type II diabetes, end-stage renal disease on peritoneal dialysis who presented to the ED on 03/01/2024 for SOB and CP. Cardiology was consulted for further evaluation.   Patient presented with complaints of SOB and CP since Monday, has not completed PD since Monday. Workup in the ED notable for creatinine 8.46, potassium 2.6, hemoglobin 9.0, WBC 10.0. Troponins 414 > 415 > 322, BNP 32,287. EKG in the ED NSR rate 91 bpm. CXR with pulmonary vascular congestion. Started on IV lasix  in the ED and had a PD treatment but did not complete overnight.  At the time of my evaluation this AM, he is resting comfortably in hospital bed. Reports feeling some better today. States that he did not do PD since Monday because he was tired and did not feel like doing it. States that since then he had progressively worsening SOB. Also had some CP which is now improved. Endorses occasional episodes of palpitations.    Review of systems complete and found to be negative unless listed above    Past Medical History:  Diagnosis Date   Acute diverticulitis 06/14/2014   Acute ST elevation myocardial infarction (STEMI) of inferior wall (HCC) 2003   a.) PCI with Cypher stent to the RCA   Anemia of chronic renal failure    Aortic atherosclerosis    Bladder tumor    CAP (community acquired pneumonia) 06/2022   Cerebral microvascular disease    Coronary artery disease involving native coronary artery of native heart  without angina pectoris 2003   a.) PCI with Cypher stent to the RCA 2003; b.) LHC 08/2003: 100% dRCA, 60% pLAD - med mgmt; c.) LHC/PCI 08/21/2015: occluded RCA at site of prior stent placement, 70% mLAD (FFR 0.79; 3.5 x 12 mm Xience DES), 50% dLAD (FFR 0.70); d.) MV 11/11/2016: no ischemia   Depression    Diverticulosis    End-stage renal disease on peritoneal dialysis (HCC)    GERD (gastroesophageal reflux disease)    GI bleed 05/15/2019   Gout    Heart murmur    Hematemesis 05/15/2019   Hepatic cyst    Hepatomegaly    Hyperlipidemia    Hypertension    Lacunar infarction (HCC) 02/11/2007   a.) CT head 02/11/2007: lacunar infarction involving thalamus and external capsule region on the RIGHT; b.) MRI brain 02/03/2020: multiple remote lacunar infarcts involving the RIGHT basal ganglia, RIGHT thalamus, and LEFT middle cerebellar peduncle   Long-term use of aspirin  therapy    Myocardial bridge    On apixaban  therapy    Pancolitis 09/2022   Paroxysmal atrial fibrillation (HCC)    a.) CHA2DS2VASc = 6 (age, HTN, CVA x 2, prior MI/vascular disease, T2DM) as of 03/11/2023; b.) rate/rhythm maintained on oral metoprolol  succinate; chronically anticoagulated with dose reduced apixaban    Renal cyst, right    Sigmoid diverticulitis 03/2022   T2DM (type 2 diabetes mellitus) (HCC)    Traumatic skull fracture and resulting subarachnoid hemorrhage 03/24/2013   a.) MOI was slipping and falling on ice  Unstable angina [facility])     Past Surgical History:  Procedure Laterality Date   BLADDER INSTILLATION N/A 04/05/2023   Procedure: BLADDER INSTILLATION OF GEMCITABINE ;  Surgeon: Twylla Glendia BROCKS, MD;  Location: [facility] ORS;  Service: Urology;  Laterality: N/A;   CAPD INSERTION N/A 08/13/2020   Procedure: LAPAROSCOPIC INSERTION CONTINUOUS AMBULATORY PERITONEAL DIALYSIS  (CAPD) CATHETER;  Surgeon: Lane Shope, MD;  Location: [facility] ORS;  Service: General;  Laterality: N/A;   CARDIAC CATHETERIZATION      CATARACT EXTRACTION W/PHACO Right 07/05/2019   Procedure: CATARACT EXTRACTION PHACO AND INTRAOCULAR LENS PLACEMENT (IOC) RIGHT DIABETIC;  Surgeon: Jaye Fallow, MD;  Location: [facility] ORS;  Service: Ophthalmology;  Laterality: Right;  US00:59.3 CDE8.41 LOT [id number] h   COLONOSCOPY WITH PROPOFOL  N/A 04/01/2017   Procedure: COLONOSCOPY WITH PROPOFOL ;  Surgeon: Gaylyn Gladis PENNER, MD;  Location: [facility] ENDOSCOPY;  Service: Endoscopy;  Laterality: N/A;   CORONARY ANGIOPLASTY WITH STENT PLACEMENT  10/2001   RCA   CORONARY ANGIOPLASTY WITH STENT PLACEMENT Left 08/21/2015   Procedure: CORONARY ANGIOPLASTY WITH STENT PLACEMENT (mid LAD); Location: [facility]; Surgeon: Zachary Car, MD   ESOPHAGOGASTRODUODENOSCOPY N/A 04/24/2023   Procedure: EGD (ESOPHAGOGASTRODUODENOSCOPY);  Surgeon: Toledo, Ladell POUR, MD;  Location: [facility] ENDOSCOPY;  Service: Gastroenterology;  Laterality: N/A;   HAND SURGERY  2015   3 screws in right hand   TOTAL HIP ARTHROPLASTY Left 04/22/2020   Procedure: TOTAL HIP ARTHROPLASTY ANTERIOR APPROACH;  Surgeon: Kathlynn Sharper, MD;  Location: [facility] ORS;  Service: Orthopedics;  Laterality: Left;   TRANSURETHRAL RESECTION OF BLADDER TUMOR N/A 04/05/2023   Procedure: TURBT (TRANSURETHRAL RESECTION OF BLADDER TUMOR);  Surgeon: Twylla Glendia BROCKS, MD;  Location: [facility] ORS;  Service: Urology;  Laterality: N/A;    (Not in a hospital admission)  Social History   Socioeconomic History   Marital status: Married    Spouse name: Sherri   Number of children: 2   Years of education: Not on file   Highest education level: Not on file  Occupational History   Not on file  Tobacco Use   Smoking status: Former    Current packs/day: 0.00    Types: Cigarettes    Quit date: 03/24/2001    Years since quitting: 22.9   Smokeless tobacco: Never  Vaping Use   Vaping status: Never Used  Substance and Sexual Activity   Alcohol use: No    Alcohol/week: 0.0 standard drinks of alcohol   Drug use: No   Sexual  activity: Not on file  Other Topics Concern   Not on file  Social History Narrative   Lives with wife   Social Drivers of Health   Tobacco Use: Medium Risk (03/01/2024)   Patient History    Smoking Tobacco Use: Former    Smokeless Tobacco Use: Never    Passive Exposure: Not on file  Financial Resource Strain: Medium Risk (11/28/2023)   Received from [facility]   Overall Financial Resource Strain (CARDIA)    Difficulty of Paying Living Expenses: Somewhat hard  Food Insecurity: Food Insecurity Present (01/24/2024)   Epic    Worried About Programme Researcher, Broadcasting/film/video in the Last Year: Sometimes true    Ran Out of Food in the Last Year: Never true  Transportation Needs: No Transportation Needs (01/24/2024)   Epic    Lack of Transportation (Medical): No    Lack of Transportation (Non-Medical): No  Physical Activity: Not on file  Stress: Not on file  Social Connections: Socially Isolated (01/24/2024)   Social Connection  and Isolation Panel    Frequency of Communication with Friends and Family: Once a week    Frequency of Social Gatherings with Friends and Family: Never    Attends Religious Services: Never    Database Administrator or Organizations: No    Attends Banker Meetings: Never    Marital Status: Married  Catering Manager Violence: Not At Risk (01/24/2024)   Epic    Fear of Current or Ex-Partner: No    Emotionally Abused: No    Physically Abused: No    Sexually Abused: No  Depression (PHQ2-9): Not on file  Alcohol Screen: Not on file  Housing: Low Risk (01/24/2024)   Epic    Unable to Pay for Housing in the Last Year: No    Number of Times Moved in the Last Year: 0    Homeless in the Last Year: No  Utilities: Not At Risk (01/24/2024)   Epic    Threatened with loss of utilities: No  Health Literacy: Not on file    Family History  Problem Relation Age of Onset   Heart disease Mother    Heart disease Father    CAD Other      Vitals:    03/01/24 0322 03/01/24 0336 03/01/24 0337 03/01/24 1025  BP:   (!) 133/108 (!) 174/108  Pulse:  90  95  Resp:  (!) 21    Temp:  97.6 F (36.4 C)    TempSrc:  Oral    SpO2:  93%    Weight:   59 kg   Height: 5' 9 (1.753 m)  5' 9 (1.753 m)     PHYSICAL EXAM General: Chronically ill appearing male, well nourished, in no acute distress. HEENT: Normocephalic and atraumatic. Neck: No JVD.  Lungs: Normal respiratory effort on room air. Bilateral crackles Heart: HRRR. Normal S1 and S2 without gallops or murmurs.  Abdomen: Non-distended appearing.  Msk: Normal strength and tone for age. Extremities: Warm and well perfused. No clubbing, cyanosis. Trace edema.  Neuro: Alert and oriented X 3. Psych: Answers questions appropriately.   Labs: Basic Metabolic Panel: Recent Labs    03/01/24 0343  NA 143  K 2.6*  CL 108  CO2 13*  GLUCOSE 190*  BUN 69*  CREATININE 8.46*  CALCIUM  7.0*  MG 1.9   Liver Function Tests: Recent Labs    03/01/24 0343  AST 31  ALT 14  ALKPHOS 178*  BILITOT <0.2  PROT 5.9*  ALBUMIN 3.3*   Recent Labs    03/01/24 0343  LIPASE 49   CBC: Recent Labs    03/01/24 0343  WBC 10.0  NEUTROABS 8.4*  HGB 9.0*  HCT 28.0*  MCV 92.4  PLT 259   Cardiac Enzymes: No results for input(s): CKTOTAL, CKMB, CKMBINDEX, TROPONINIHS in the last 72 hours. BNP: No results for input(s): BNP in the last 72 hours. D-Dimer: No results for input(s): DDIMER in the last 72 hours. Hemoglobin A1C: No results for input(s): HGBA1C in the last 72 hours. Fasting Lipid Panel: No results for input(s): CHOL, HDL, LDLCALC, TRIG, CHOLHDL, LDLDIRECT in the last 72 hours. Thyroid  Function Tests: No results for input(s): TSH, T4TOTAL, T3FREE, THYROIDAB in the last 72 hours.  Invalid input(s): FREET3 Anemia Panel: No results for input(s): VITAMINB12, FOLATE, FERRITIN, TIBC, IRON , RETICCTPCT in the last 72 hours.   Radiology: DG  Chest Portable 1 View Result Date: 03/01/2024 EXAM: 1 VIEW(S) XRAY OF THE CHEST 03/01/2024 04:02:00 AM COMPARISON: Portable chest 01/25/2024. CLINICAL  HISTORY: SOB Shortness of breath. FINDINGS: LINES, TUBES AND DEVICES: Telemetry leads overlie the chest. LUNGS AND PLEURA: There are bilateral small pleural effusions with improvement compared with 01/25/2024. Development of mild interstitial edema in the lung bases. There are low lung volumes, continued patchy atelectasis or airspace disease in the hypoinflated lung bases. No pneumothorax. HEART AND MEDIASTINUM: Stable cardiomegaly. There is increased central vascular congestion. Mediastinum is stable with aortic atherosclerosis. BONES AND SOFT TISSUES: Osteopenia and degenerative change. IMPRESSION: 1. Increased central vascular congestion and development of mild interstitial edema in the lung bases. 2. Bilateral small pleural effusions, improved compared to 01/25/24. 3. Low inspiration with continued patchy atelectasis or infiltrates in the hypoinflated bases. 4. Stable cardiomegaly. Electronically signed by: Francis Quam MD 03/01/2024 04:15 AM EST RP Workstation: [id number]    ECHO 11/2023: 1. Left ventricular ejection fraction, by estimation, is 60 to 65%. The left ventricle has normal function. The left ventricle has no regional wall motion abnormalities. Left ventricular diastolic parameters are consistent with Grade II diastolic dysfunction (pseudonormalization).   2. Right ventricular systolic function is normal. The right ventricular  size is normal.   3. The mitral valve is normal in structure. Mild mitral valve  regurgitation. No evidence of mitral stenosis.   4. The aortic valve is normal in structure. Aortic valve regurgitation is  not visualized. Aortic valve sclerosis/calcification is present, without  any evidence of aortic stenosis.   5. The inferior vena cava is normal in size with greater than 50%  respiratory variability, suggesting  right atrial pressure of 3 mmHg.   TELEMETRY (personally reviewed): not on tele  EKG (personally reviewed): NSR rate 91 bpm  Data reviewed by me 03/01/2024: last 24h vitals tele labs imaging I/O ED provider note, admission H&P  Principal Problem:   Fluid overload Active Problems:   Hypertensive urgency   Chest pain   Hypokalemia   Dyslipidemia   Depression   Type 2 diabetes mellitus with end-stage renal disease (HCC)   C. difficile enteritis    ASSESSMENT AND PLAN:  Casey French is a 74 y.o. male  with a past medical history of coronary artery disease s/p multiple stents most recently 2017 @ [facility], paroxysmal atrial fibrillation, hypertension, hyperlipidemia, type II diabetes, end-stage renal disease on peritoneal dialysis who presented to the ED on 03/01/2024 for SOB and CP. Cardiology was consulted for further evaluation.   # Acute on chronic HFpEF  # Atrial fibrillation RVR # Paroxysmal atrial fibrillation # Coronary artery disease # Demand ischemia Patient presented for evaluation of SOB, CP after skipping PD since Monday. Started on IV lasix  in the ED and underwent PD session but did not complete this. Still makes urine.  Echo 11/2023 with preserved EF. - Continue IV Lasix  60 mg twice daily.  Nephrology following, appreciate their recommendations. - Continue home Imdur  to 120 mg daily, amlodipine  10 mg daily, metoprolol  tartrate 75 mg twice daily. - Can continue IV heparin  until 48 hours have been completed, then we will plan to transition back to home Eliquis  2.5 mg twice daily (weight and renal function).  -Mild and flat troponins most consistent with demand/supply mismatch and not ACS in the setting of acute heart failure after missed dialysis sessions.  No further cardiac diagnostics recommended at this time.   This patient's plan of care was discussed and created with Dr. Florencio and he is in agreement.  Signed: Danita Bloch, PA-C  03/01/2024, 11:02 AM [facility] Card[facility]      "

## 2024-03-01 NOTE — ED Notes (Signed)
 CCMD called to initiate cardiac monitoring.

## 2024-03-01 NOTE — ED Provider Notes (Signed)
 "  [facility] Provider Note    Event Date/Time   First MD Initiated Contact with Patient 03/01/24 [phone]     (approximate)   History   Shortness of Breath   HPI  Casey French is a 74 y.o. male with history of CAD, grade 2 diastolic dysfunction, hypertension, hyperlipidemia, diabetes, paroxysmal atrial fibrillation, CVA, end-stage renal disease on peritoneal dialysis who presents emergency department complaints of chest pain and shortness of breath today.  Sats 92% on room air with EMS.  They placed him on oxygen for comfort.  Was extremely hypertensive.  Gave nitroglycerin  paste.  Patient states he does still make urine.  Reports history of CHF.  Denies COPD or asthma.  No fevers, productive cough.  States he has not done peritoneal dialysis since Monday, February 2 because he has felt so poorly with nausea, vomiting abdominal pain as he states he was recently diagnosed with C. difficile.  He states he has not started on antibiotics because he cannot afford them.  He denies any abdominal discomfort.   History provided by patient and EMS.    Past Medical History:  Diagnosis Date   Acute diverticulitis 06/14/2014   Acute ST elevation myocardial infarction (STEMI) of inferior wall (HCC) 2003   a.) PCI with Cypher stent to the RCA   Anemia of chronic renal failure    Aortic atherosclerosis    Bladder tumor    CAP (community acquired pneumonia) 06/2022   Cerebral microvascular disease    Coronary artery disease involving native coronary artery of native heart without angina pectoris 2003   a.) PCI with Cypher stent to the RCA 2003; b.) LHC 08/2003: 100% dRCA, 60% pLAD - med mgmt; c.) LHC/PCI 08/21/2015: occluded RCA at site of prior stent placement, 70% mLAD (FFR 0.79; 3.5 x 12 mm Xience DES), 50% dLAD (FFR 0.70); d.) MV 11/11/2016: no ischemia   Depression    Diverticulosis    End-stage renal disease on peritoneal dialysis (HCC)    GERD (gastroesophageal  reflux disease)    GI bleed 05/15/2019   Gout    Heart murmur    Hematemesis 05/15/2019   Hepatic cyst    Hepatomegaly    Hyperlipidemia    Hypertension    Lacunar infarction (HCC) 02/11/2007   a.) CT head 02/11/2007: lacunar infarction involving thalamus and external capsule region on the RIGHT; b.) MRI brain 02/03/2020: multiple remote lacunar infarcts involving the RIGHT basal ganglia, RIGHT thalamus, and LEFT middle cerebellar peduncle   Long-term use of aspirin  therapy    Myocardial bridge    On apixaban  therapy    Pancolitis 09/2022   Paroxysmal atrial fibrillation (HCC)    a.) CHA2DS2VASc = 6 (age, HTN, CVA x 2, prior MI/vascular disease, T2DM) as of 03/11/2023; b.) rate/rhythm maintained on oral metoprolol  succinate; chronically anticoagulated with dose reduced apixaban    Renal cyst, right    Sigmoid diverticulitis 03/2022   T2DM (type 2 diabetes mellitus) (HCC)    Traumatic skull fracture and resulting subarachnoid hemorrhage 03/24/2013   a.) MOI was slipping and falling on ice   Unstable angina [facility])     Past Surgical History:  Procedure Laterality Date   BLADDER INSTILLATION N/A 04/05/2023   Procedure: BLADDER INSTILLATION OF GEMCITABINE ;  Surgeon: Twylla Glendia BROCKS, MD;  Location: [facility] ORS;  Service: Urology;  Laterality: N/A;   CAPD INSERTION N/A 08/13/2020   Procedure: LAPAROSCOPIC INSERTION CONTINUOUS AMBULATORY PERITONEAL DIALYSIS  (CAPD) CATHETER;  Surgeon: Lane Shope, MD;  Location:  [facility] ORS;  Service: General;  Laterality: N/A;   CARDIAC CATHETERIZATION     CATARACT EXTRACTION W/PHACO Right 07/05/2019   Procedure: CATARACT EXTRACTION PHACO AND INTRAOCULAR LENS PLACEMENT (IOC) RIGHT DIABETIC;  Surgeon: Jaye Fallow, MD;  Location: [facility] ORS;  Service: Ophthalmology;  Laterality: Right;  US00:59.3 CDE8.41 LOT [id number] h   COLONOSCOPY WITH PROPOFOL  N/A 04/01/2017   Procedure: COLONOSCOPY WITH PROPOFOL ;  Surgeon: Gaylyn Gladis PENNER, MD;  Location: [facility]  ENDOSCOPY;  Service: Endoscopy;  Laterality: N/A;   CORONARY ANGIOPLASTY WITH STENT PLACEMENT  10/2001   RCA   CORONARY ANGIOPLASTY WITH STENT PLACEMENT Left 08/21/2015   Procedure: CORONARY ANGIOPLASTY WITH STENT PLACEMENT (mid LAD); Location: [facility]; Surgeon: Zachary Car, MD   ESOPHAGOGASTRODUODENOSCOPY N/A 04/24/2023   Procedure: EGD (ESOPHAGOGASTRODUODENOSCOPY);  Surgeon: Toledo, Ladell POUR, MD;  Location: [facility] ENDOSCOPY;  Service: Gastroenterology;  Laterality: N/A;   HAND SURGERY  2015   3 screws in right hand   TOTAL HIP ARTHROPLASTY Left 04/22/2020   Procedure: TOTAL HIP ARTHROPLASTY ANTERIOR APPROACH;  Surgeon: Kathlynn Sharper, MD;  Location: [facility] ORS;  Service: Orthopedics;  Laterality: Left;   TRANSURETHRAL RESECTION OF BLADDER TUMOR N/A 04/05/2023   Procedure: TURBT (TRANSURETHRAL RESECTION OF BLADDER TUMOR);  Surgeon: Twylla Glendia BROCKS, MD;  Location: [facility] ORS;  Service: Urology;  Laterality: N/A;    MEDICATIONS:  Prior to Admission medications  Medication Sig Start Date End Date Taking? Authorizing Provider  acetaminophen  (TYLENOL ) 325 MG tablet Take 2 tablets (650 mg total) by mouth every 6 (six) hours as needed for mild pain (or Fever >/= 101). 10/11/22   Jens Durand, MD  amLODipine  (NORVASC ) 10 MG tablet Take 10 mg by mouth daily. 09/16/23 09/15/24  [provider]  apixaban  (ELIQUIS ) 2.5 MG TABS tablet Take 2.5 mg by mouth 2 (two) times daily.    [provider]  aspirin  EC 81 MG tablet Take 1 tablet (81 mg total) by mouth daily. Swallow whole. 01/28/24   Lenon Marien CROME, MD  calcitRIOL  (ROCALTROL ) 0.25 MCG capsule Take 1 capsule (0.25 mcg total) by mouth daily. 12/24/23 03/23/24  Alexander, Natalie, DO  calcium  carbonate (TUMS) 500 MG chewable tablet Chew 2 tablets by mouth 3 (three) times daily with meals. 08/05/21   [provider]  cyanocobalamin  (VITAMIN B12) 500 MCG tablet Take 1 tablet (500 mcg total) by mouth daily. 12/24/23   Alexander,  Natalie, DO  folic acid  (FOLVITE ) 1 MG tablet Take 1 tablet (1 mg total) by mouth daily. 12/24/23 03/23/24  Alexander, Natalie, DO  furosemide  (LASIX ) 80 MG tablet Take 80 mg by mouth daily. 01/13/24   [provider]  gabapentin  (NEURONTIN ) 300 MG capsule Take 300 mg by mouth at bedtime. 09/25/16   [provider]  hydrocortisone  (ANUSOL -HC) 2.5 % rectal cream Place 1 application rectally 2 (two) times daily as needed for hemorrhoids or anal itching.    [provider]  isosorbide  mononitrate (IMDUR ) 120 MG 24 hr tablet Take 1 tablet (120 mg total) by mouth 2 (two) times daily. 01/27/24 02/26/24  Lenon Marien CROME, MD  metoprolol  tartrate (LOPRESSOR ) 50 MG tablet Take 1.5 tablets (75 mg total) by mouth 2 (two) times daily. 01/27/24   Lenon Marien CROME, MD  nitroGLYCERIN  (NITROSTAT ) 0.4 MG SL tablet Place 0.4 mg under the tongue every 5 (five) minutes as needed for chest pain.    [provider]  omeprazole 20 MG TBDD disintegrating tablet Take 20 mg by mouth daily. 08/05/21   [provider]  ondansetron  (ZOFRAN )  4 MG tablet Take 4 mg by mouth every 8 (eight) hours as needed. 01/16/24   [provider]  promethazine  (PHENERGAN ) 25 MG tablet Take 1 tablet (25 mg total) by mouth every 8 (eight) hours as needed for refractory nausea / vomiting. 12/24/23   Alexander, Natalie, DO  sevelamer  carbonate (RENVELA ) 800 MG tablet Take 800 mg by mouth 3 (three) times daily with meals. 09/23/23   [provider]  sodium bicarbonate  650 MG tablet Take 2 tablets (1,300 mg total) by mouth 2 (two) times daily. 12/24/23 12/23/24  Marsa Edelman, DO    Physical Exam   Triage Vital Signs: ED Triage Vitals  Encounter Vitals Group     BP 03/01/24 0337 (!) 133/108     Girls Systolic BP Percentile --      Girls Diastolic BP Percentile --      Boys Systolic BP Percentile --      Boys Diastolic BP Percentile --      Pulse Rate 03/01/24 0336 90     Resp  03/01/24 0336 (!) 21     Temp 03/01/24 0336 97.6 F (36.4 C)     Temp Source 03/01/24 0336 Oral     SpO2 03/01/24 0321 92 %     Weight 03/01/24 0337 130 lb (59 kg)     Height 03/01/24 0322 5' 9 (1.753 m)     Head Circumference --      Peak Flow --      Pain Score 03/01/24 0328 3     Pain Loc --      Pain Education --      Exclude from Growth Chart --     Most recent vital signs: Vitals:   03/01/24 0336 03/01/24 0337  BP:  (!) 133/108  Pulse: 90   Resp: (!) 21   Temp: 97.6 F (36.4 C)   SpO2: 93%     CONSTITUTIONAL: Alert, responds appropriately to questions.  Chronically ill-appearing, appears short of breath HEAD: Normocephalic, atraumatic EYES: Conjunctivae clear, pupils appear equal, sclera nonicteric ENT: normal nose; moist mucous membranes NECK: Supple, normal ROM CARD: RRR; S1 and S2 appreciated RESP: Tachypneic with diminished aeration at bases bilaterally.  No wheezing, rhonchi.  No hypoxia on nasal cannula. ABD/GI: Non-distended; soft, non-tender, no rebound, no guarding, no peritoneal signs BACK: The back appears normal EXT: Normal ROM in all joints; no deformity noted, no edema, no calf tenderness or calf swelling SKIN: Normal color for age and race; warm; no rash on exposed skin NEURO: Moves all extremities equally, normal speech PSYCH: The patient's mood and manner are appropriate.   ED Results / Procedures / Treatments   LABS: (all labs ordered are listed, but only abnormal results are displayed) Labs Reviewed  CBC WITH DIFFERENTIAL/PLATELET - Abnormal; Notable for the following components:      Result Value   RBC 3.03 (*)    Hemoglobin 9.0 (*)    HCT 28.0 (*)    RDW 18.0 (*)    Neutro Abs 8.4 (*)    Lymphs Abs 0.5 (*)    Abs Immature Granulocytes 0.12 (*)    All other components within normal limits  COMPREHENSIVE METABOLIC PANEL WITH GFR - Abnormal; Notable for the following components:   Potassium 2.6 (*)    CO2 13 (*)    Glucose, Bld 190  (*)    BUN 69 (*)    Creatinine, Ser 8.46 (*)    Calcium  7.0 (*)    Total Protein 5.9 (*)  Albumin 3.3 (*)    Alkaline Phosphatase 178 (*)    GFR, Estimated 6 (*)    Anion gap 21 (*)    All other components within normal limits  PRO BRAIN NATRIURETIC PEPTIDE - Abnormal; Notable for the following components:   Pro Brain Natriuretic Peptide 32,287.0 (*)    All other components within normal limits  URINALYSIS, ROUTINE W REFLEX MICROSCOPIC - Abnormal; Notable for the following components:   Color, Urine COLORLESS (*)    APPearance CLEAR (*)    Glucose, UA >=500 (*)    Hgb urine dipstick SMALL (*)    Ketones, ur 5 (*)    Protein, ur >=300 (*)    All other components within normal limits  TROPONIN T, HIGH SENSITIVITY - Abnormal; Notable for the following components:   Troponin T High Sensitivity 414 (*)    All other components within normal limits  RESP PANEL BY RT-PCR (RSV, FLU A&B, COVID)  RVPGX2  LIPASE, BLOOD  MAGNESIUM   HEPARIN  LEVEL (UNFRACTIONATED)  APTT  PROTIME-INR  TROPONIN T, HIGH SENSITIVITY     EKG:  EKG Interpretation Date/Time:  Thursday March 01 2024 03:34:44 EST Ventricular Rate:  91 PR Interval:  160 QRS Duration:  94 QT Interval:  434 QTC Calculation: 533 R Axis:   79  Text Interpretation: Normal sinus rhythm Left ventricular hypertrophy with repolarization abnormality ( Sokolow-Lyon ) Prolonged QT Abnormal ECG When compared with ECG of 25-Jan-2024 22:14, PR interval has decreased QT has lengthened Confirmed by Neomi Neptune [phone]) on 03/01/2024 3:39:37 AM         RADIOLOGY: My personal review and interpretation of imaging: Chest x-ray shows pulmonary edema.  I have personally reviewed all radiology reports.   DG Chest Portable 1 View Result Date: 03/01/2024 EXAM: 1 VIEW(S) XRAY OF THE CHEST 03/01/2024 04:02:00 AM COMPARISON: Portable chest 01/25/2024. CLINICAL HISTORY: SOB Shortness of breath. FINDINGS: LINES, TUBES AND DEVICES: Telemetry  leads overlie the chest. LUNGS AND PLEURA: There are bilateral small pleural effusions with improvement compared with 01/25/2024. Development of mild interstitial edema in the lung bases. There are low lung volumes, continued patchy atelectasis or airspace disease in the hypoinflated lung bases. No pneumothorax. HEART AND MEDIASTINUM: Stable cardiomegaly. There is increased central vascular congestion. Mediastinum is stable with aortic atherosclerosis. BONES AND SOFT TISSUES: Osteopenia and degenerative change. IMPRESSION: 1. Increased central vascular congestion and development of mild interstitial edema in the lung bases. 2. Bilateral small pleural effusions, improved compared to 01/25/24. 3. Low inspiration with continued patchy atelectasis or infiltrates in the hypoinflated bases. 4. Stable cardiomegaly. Electronically signed by: Francis Quam MD 03/01/2024 04:15 AM EST RP Workstation: [id number]     PROCEDURES:  Critical Care performed: No    .1-3 Lead EKG Interpretation  Performed by: Keyerra Lamere, Neptune SAILOR, DO Authorized by: Joell Buerger, Neptune SAILOR, DO     Interpretation: normal     ECG rate:  90   ECG rate assessment: normal     Rhythm: sinus rhythm     Ectopy: none     Conduction: normal       IMPRESSION / MDM / ASSESSMENT AND PLAN / ED COURSE  I reviewed the triage vital signs and the nursing notes.    Patient here with shortness of breath, chest pain.  Slight increased work of breathing and sats in the low 90s.  Missed dialysis x 4 days.  The patient is on the cardiac monitor to evaluate for evidence of arrhythmia and/or significant heart rate changes.  DIFFERENTIAL DIAGNOSIS (includes but not limited to):   Pulmonary edema, CHF exacerbation, ACS, pneumothorax, pneumonia, anemia, PE   Patient's presentation is most consistent with acute presentation with potential threat to life or bodily function.   PLAN: Will obtain labs, chest x-ray, EKG.  Patient on oxygen for comfort.  Not  truly hypoxic with EMS but does have increased work of breathing and appeared dyspneic on exam.   MEDICATIONS GIVEN IN ED: Medications  aspirin  chewable tablet 324 mg (324 mg Oral Not Given 03/01/24 0527)  potassium chloride  (KLOR-CON ) packet 40 mEq (has no administration in time range)  potassium chloride  10 mEq in 100 mL IVPB (has no administration in time range)  heparin  ADULT infusion 100 units/mL (25000 units/250mL) (has no administration in time range)  furosemide  (LASIX ) injection 40 mg (40 mg Intravenous Given 03/01/24 0515)     ED COURSE: EKG shows no ischemia.  Does show prolonged QT interval.  Will check electrolytes.  Hemoglobin of 9 which has improved from 7.3 a month ago and likely due to his chronic kidney disease.  Chest x-ray reviewed and interpreted by myself and the radiologist and shows mild interstitial edema.  Atelectasis versus infiltrate seen at the bases of his lungs.  He denies any infectious symptoms and has no leukocytosis, productive cough, fever here.  I favor that this is atelectasis.  States he still makes urine.  Will give IV Lasix .  Will discuss with hospitalist for admission.  5:45 AM  Pt's troponin has come back elevated at 414.  This is more elevated than he has been previously in the 200s.  He reports his chest pain is now gone and his blood pressure has improved.  Will start heparin .  Received full dose aspirin  already with EMS.  Potassium also low at 2.6.  Will give oral and IV replacement especially given prolonged QT interval on EKG and that he needs diuresis.  Respiratory rate has improved.  Patient reports feeling better.   CONSULTS:  Consulted and discussed patient's case with hospitalist, Dr. Lawence.  I have recommended admission and consulting physician agrees and will place admission orders.  Patient (and family if present) agree with this plan.   I reviewed all nursing notes, vitals, pertinent previous records.  All labs, EKGs, imaging ordered  have been independently reviewed and interpreted by myself.    OUTSIDE RECORDS REVIEWED: Reviewed last primary care doctor notes.  Reviewed recent cardiology note.       FINAL CLINICAL IMPRESSION(S) / ED DIAGNOSES   Final diagnoses:  Shortness of breath  Chest pain, unspecified type  NSTEMI (non-ST elevated myocardial infarction) (HCC)  Acute pulmonary edema (HCC)  Prolonged Q-T interval on ECG  Hypokalemia     Rx / DC Orders   ED Discharge Orders     None        Note:  This document was prepared using Dragon voice recognition software and may include unintentional dictation errors.   Machel Violante, Josette SAILOR, DO 03/01/24 [phone]  "

## 2024-03-01 NOTE — Progress Notes (Signed)
"  °  Peritoneal Dialysis Treatment Initiation Note     Pre Treatment Weight: 64.2 Kg   Consent signed and in chart.  PD treatment initiated via aseptic technique.    Patient is awake and alert. No complaints of pain.    PD exit site clean, dry and intact.  Gentamicin  and new dressing applied.    Hand-off given to the patient's nurse.  Education provided to dept staff  regarding PD machine and how  to contact tech support if machine  alarms.    Ozell Jubilee RN [facility] "

## 2024-03-01 NOTE — Assessment & Plan Note (Signed)
We will resume statin therapy. ?

## 2024-03-01 NOTE — Plan of Care (Signed)
" °  Problem: Clinical Measurements: Goal: Ability to maintain clinical measurements within normal limits will improve Outcome: Progressing Goal: Respiratory complications will improve Outcome: Progressing Goal: Cardiovascular complication will be avoided Outcome: Progressing   Problem: Activity: Goal: Risk for activity intolerance will decrease Outcome: Progressing   Problem: Nutrition: Goal: Adequate nutrition will be maintained Outcome: Progressing   Problem: Elimination: Goal: Will not experience complications related to bowel motility Outcome: Progressing   Problem: Pain Managment: Goal: General experience of comfort will improve and/or be controlled Outcome: Progressing   Problem: Safety: Goal: Ability to remain free from injury will improve Outcome: Progressing   "

## 2024-03-01 NOTE — Assessment & Plan Note (Signed)
"   Will continue Lexapro .  "

## 2024-03-01 NOTE — Progress Notes (Signed)
 ANTICOAGULATION CONSULT NOTE  Pharmacy Consult for heparin  infusion Indication: ACS/STEMI  Allergies[1]  Patient Measurements: Height: 5' 9 (175.3 cm) Weight: 59 kg (130 lb) IBW/kg (Calculated) : 70.7 HEPARIN  DW (KG): 59  Vital Signs: Temp: 97.6 F (36.4 C) (02/05 0336) Temp Source: Oral (02/05 0336) BP: 133/108 (02/05 0337) Pulse Rate: 90 (02/05 0336)  Labs: Recent Labs    03/01/24 0343  HGB 9.0*  HCT 28.0*  PLT 259  CREATININE 8.46*    Estimated Creatinine Clearance: 6.6 mL/min (A) (by C-G formula based on SCr of 8.46 mg/dL (H)).   Medical History: Past Medical History:  Diagnosis Date   Acute diverticulitis 06/14/2014   Acute ST elevation myocardial infarction (STEMI) of inferior wall (HCC) 2003   a.) PCI with Cypher stent to the RCA   Anemia of chronic renal failure    Aortic atherosclerosis    Bladder tumor    CAP (community acquired pneumonia) 06/2022   Cerebral microvascular disease    Coronary artery disease involving native coronary artery of native heart without angina pectoris 2003   a.) PCI with Cypher stent to the RCA 2003; b.) LHC 08/2003: 100% dRCA, 60% pLAD - med mgmt; c.) LHC/PCI 08/21/2015: occluded RCA at site of prior stent placement, 70% mLAD (FFR 0.79; 3.5 x 12 mm Xience DES), 50% dLAD (FFR 0.70); d.) MV 11/11/2016: no ischemia   Depression    Diverticulosis    End-stage renal disease on peritoneal dialysis (HCC)    GERD (gastroesophageal reflux disease)    GI bleed 05/15/2019   Gout    Heart murmur    Hematemesis 05/15/2019   Hepatic cyst    Hepatomegaly    Hyperlipidemia    Hypertension    Lacunar infarction (HCC) 02/11/2007   a.) CT head 02/11/2007: lacunar infarction involving thalamus and external capsule region on the RIGHT; b.) MRI brain 02/03/2020: multiple remote lacunar infarcts involving the RIGHT basal ganglia, RIGHT thalamus, and LEFT middle cerebellar peduncle   Long-term use of aspirin  therapy    Myocardial bridge     On apixaban  therapy    Pancolitis 09/2022   Paroxysmal atrial fibrillation (HCC)    a.) CHA2DS2VASc = 6 (age, HTN, CVA x 2, prior MI/vascular disease, T2DM) as of 03/11/2023; b.) rate/rhythm maintained on oral metoprolol  succinate; chronically anticoagulated with dose reduced apixaban    Renal cyst, right    Sigmoid diverticulitis 03/2022   T2DM (type 2 diabetes mellitus) (HCC)    Traumatic skull fracture and resulting subarachnoid hemorrhage 03/24/2013   a.) MOI was slipping and falling on ice   Unstable angina (HCC)     Medications:  PTA Meds: Apixaban  2.5 mg BID, last does unknown  Assessment: Pt is a 74 yo male on home peritoneal dialysis presenting to ED c/o SOB and chest tightness found with elevated troponin and BNP levels.  Goal of Therapy:  Heparin  level 0.3-0.7 units/ml aPTT 66-102 seconds Monitor platelets by anticoagulation protocol: Yes   Plan:  Will not given initial bolus Start heparin  infusion at 800 units/hr Will follow aPTT until correlation w/ HL confirmed Will check aPTT in 8 hr after start of infusion HL & CBC daily while on heparin   Rankin CANDIE Dills, PharmD, [facility] 03/01/2024 5:29 AM        [1]  Allergies Allergen Reactions   Morphine  Anxiety    agitation

## 2024-03-01 NOTE — Assessment & Plan Note (Addendum)
-   Potassium was replaced and will be followed and magnesium  level was checked and came back 1.9.

## 2024-03-01 NOTE — ED Triage Notes (Signed)
 SOB with hx of CHF. Denies chest pain but reports some tightness. Started peritoneal dialysis at home tonight and was unable to complete treatment due to the SOB. Pt states hasn't done PD in the 2 days prior earlier. Also endorsing dx of cdiff but no abx due to cost of abx. Pt alert and oriented.   229/123- 2 inches nitroglycerin  given-- 227/122 92% RA- place on 2L and improved to 97-98% HR ST 105 2 inches nitroglycerine paste 0307 by ems  324 asa given by ems

## 2024-03-01 NOTE — Assessment & Plan Note (Signed)
-   The patient will be placed on supplemental coverage with NovoLog . - Would hold off metformin . - He will be placed on Neurontin  for diabetic neuropathy.

## 2024-03-01 NOTE — Telephone Encounter (Addendum)
 Patient Product/process Development Scientist completed.    The patient is insured through [employer]. Patient has Medicare and is not eligible for a copay card, but may be able to apply for patient assistance or Medicare RX Payment Plan (Patient Must reach out to their plan, if eligible for payment plan), if available.    Ran test claim for fidaxomicin  (Dificid ) 200 mg and the current 10 day co-pay is $1,386.81.  Ran test claim for vancomycin  125 mg and the current 10 day co-pay is $57.98.   This test claim was processed through [facility]- copay amounts may vary at other pharmacies due to pharmacy/plan contracts, or as the patient moves through the different stages of their insurance plan.     Reyes Sharps, CPHT Pharmacy Technician Patient Advocate Specialist Lead [facility] Patient Advocate Team Direct Number: [phone]  Fax: [phone]

## 2024-03-01 NOTE — Assessment & Plan Note (Signed)
-   The patient will be continued on his antihypertensive therapy. - Will place him on as needed IV labetalol  and hydralazine .

## 2024-03-01 NOTE — ED Triage Notes (Signed)
 Pt arrived via [facility] from home with increased work in breathing since last night prior to starting his @ home peritoneal dialysis. Pt has hx/o CHF and MI. Initial O2 saturation with EMS was 92% room air.

## 2024-03-01 NOTE — ED Notes (Addendum)
 Dezii MD messaged at 1344 due to pt's heart rate going to 150's. Pt stating that his chest feels tight. Pt's HR consistently in 120's-130's range. This RN asked Melody LPN to page MD about pt. Donnice RN messaged MD at [phone] about pt's HR. MD responded at 1411, and ordered IVP metoprolol  at 1413. Pt's HR has yet to improve after metoprolol  given. Dezii MD notified via secure chat. MD to come and check on pt shortly. Attempted to get PD started early per MD suggestion, however resources are not available until this evening. Nephro also aware.

## 2024-03-01 NOTE — H&P (Signed)
 "     [place]   PATIENT NAME: Casey French    MR#:  [id number]  DATE OF BIRTH:  [birth year]  DATE OF ADMISSION:  03/01/2024  PRIMARY CARE PHYSICIAN: Valora Lynwood FALCON, MD   Patient is coming from: Home  REQUESTING/REFERRING PHYSICIAN: Ward, Josette SAILOR, DO  CHIEF COMPLAINT:   Chief Complaint  Patient presents with   Shortness of Breath    HISTORY OF PRESENT ILLNESS:  Casey French is a 74 y.o. male with medical history significant for coronary artery disease, grade 2 diastolic dysfunction, end-stage renal disease on peritoneal dialysis, recent C. difficile, hypertension, dyslipidemia and paroxysmal atrial fibrillation and gout, who presented to the emergency room with acute onset of worsening dyspnea with a pulse currently of 92% on room air per EMS.  He has not had his peritoneal dialysis at home for 3 days.  He has not filled Dificid  prescription for C. difficile that was recently given.  His blood pressure was elevated and he was given a ride to glycerin paste he continues to make a small amount of urine.  He was having orthopnea and dyspnea on exertion but denies significant cough.  He has been having nausea and vomiting with associated mild abdominal pain.  No fever or chills.  ED Course: When he came to the ER, BP was 133/108 with respiratory rate of 21 and Pulsoxymeter is 93% on room air.  BP was later up to 205/115 and respiratory rate was up to 38 then 29.  Labs revealed potassium of 2.6 and CO2 13 with blood glucose 190, BUN of 69 and creatinine 8.46 with calcium  of 7 with anion gap of 21.  Alk phos was 178 and albumin 3.3 with total protein of 5.9.  proBNP was 32,287 and high-sensitivity troponin T was 414 and later 415 then 322.  CBC showed hemoglobin 9 hematocrit 28.  PT was 15.4 and PTT 39 with INR 1.2.  Rest Thana panel came back negative.  UA showed more than 300 protein and more than 500 glucose EKG as reviewed by me : EKG showed sinus rhythm with a rate of 91  with LVH and prolonged QT interval with QTc of 533 MS. Imaging: Portable chest x-ray showed the following: 1. Increased central vascular congestion and development of mild interstitial edema in the lung bases. 2. Bilateral small pleural effusions, improved compared to 01/25/24. 3. Low inspiration with continued patchy atelectasis or infiltrates in the hypoinflated bases. 4. Stable cardiomegaly.  The patient was given 40 minutes with phone.  Potassium chloride  and 10 mill equivalents IV potassium chloride , IV heparin  given elevated troponin, 40 mg of IV Lasix  and Dr. Douglas was notified about patient.  The patient will be admitted to a Progressive unit bed for further evaluation and management. PAST MEDICAL HISTORY:   Past Medical History:  Diagnosis Date   Acute diverticulitis 06/14/2014   Acute ST elevation myocardial infarction (STEMI) of inferior wall (HCC) 2003   a.) PCI with Cypher stent to the RCA   Anemia of chronic renal failure    Aortic atherosclerosis    Bladder tumor    CAP (community acquired pneumonia) 06/2022   Cerebral microvascular disease    Coronary artery disease involving native coronary artery of native heart without angina pectoris 2003   a.) PCI with Cypher stent to the RCA 2003; b.) LHC 08/2003: 100% dRCA, 60% pLAD - med mgmt; c.) LHC/PCI 08/21/2015: occluded RCA at site of prior stent placement, 70% mLAD (FFR 0.79; 3.5  x 12 mm Xience DES), 50% dLAD (FFR 0.70); d.) MV 11/11/2016: no ischemia   Depression    Diverticulosis    End-stage renal disease on peritoneal dialysis (HCC)    GERD (gastroesophageal reflux disease)    GI bleed 05/15/2019   Gout    Heart murmur    Hematemesis 05/15/2019   Hepatic cyst    Hepatomegaly    Hyperlipidemia    Hypertension    Lacunar infarction (HCC) 02/11/2007   a.) CT head 02/11/2007: lacunar infarction involving thalamus and external capsule region on the RIGHT; b.) MRI brain 02/03/2020: multiple remote lacunar infarcts  involving the RIGHT basal ganglia, RIGHT thalamus, and LEFT middle cerebellar peduncle   Long-term use of aspirin  therapy    Myocardial bridge    On apixaban  therapy    Pancolitis 09/2022   Paroxysmal atrial fibrillation (HCC)    a.) CHA2DS2VASc = 6 (age, HTN, CVA x 2, prior MI/vascular disease, T2DM) as of 03/11/2023; b.) rate/rhythm maintained on oral metoprolol  succinate; chronically anticoagulated with dose reduced apixaban    Renal cyst, right    Sigmoid diverticulitis 03/2022   T2DM (type 2 diabetes mellitus) (HCC)    Traumatic skull fracture and resulting subarachnoid hemorrhage 03/24/2013   a.) MOI was slipping and falling on ice   Unstable angina (HCC)     PAST SURGICAL HISTORY:   Past Surgical History:  Procedure Laterality Date   BLADDER INSTILLATION N/A 04/05/2023   Procedure: BLADDER INSTILLATION OF GEMCITABINE ;  Surgeon: Twylla Glendia BROCKS, MD;  Location: [facility] OR[facility];  Service: Urology;  Laterality: N/A;   CAPD INSERTION N/A 08/13/2020   Procedure: LAPAROSCOPIC INSERTION CONTINUOUS AMBULATORY PERITONEAL DIALYSIS  (CAPD) CATHETER;  Surgeon: Lane Shope, MD;  Location: [facility] OR[facility];  Service: General;  Laterality: N/A;   CARDIAC CATHETERIZATION     CATARACT EXTRACTION W/PHACO Right 07/05/2019   Procedure: CATARACT EXTRACTION PHACO AND INTRAOCULAR LENS PLACEMENT (IOC) RIGHT DIABETIC;  Surgeon: Jaye Fallow, MD;  Location: [facility];  Service: Ophthalmology;  Laterality: Right;  US00:59.3 CDE8.41 LOT [id number] h   COLONOSCOPY WITH PROPOFOL  N/A 04/01/2017   Procedure: COLONOSCOPY WITH PROPOFOL ;  Surgeon: Gaylyn Gladis PENNER, MD;  Location: [facility];  Service: Endoscopy;  Laterality: N/A;   CORONARY ANGIOPLASTY WITH STENT PLACEMENT  10/2001   RCA   CORONARY ANGIOPLASTY WITH STENT PLACEMENT Left 08/21/2015   Procedure: CORONARY ANGIOPLASTY WITH STENT PLACEMENT (mid LAD); Location: [facility]; Surgeon: Zachary Car, MD   ESOPHAGOGASTRODUODENOSCOPY N/A 04/24/2023   Procedure: EGD  (ESOPHAGOGASTRODUODENOSCOPY);  Surgeon: Toledo, Ladell POUR, MD;  Location: [facility] ENDOSCOPY;  Service: Gastroenterology;  Laterality: N/A;   HAND SURGERY  2015   3 screws in right hand   TOTAL HIP ARTHROPLASTY Left 04/22/2020   Procedure: TOTAL HIP ARTHROPLASTY ANTERIOR APPROACH;  Surgeon: Kathlynn Sharper, MD;  Location: [facility];  Service: Orthopedics;  Laterality: Left;   TRANSURETHRAL RESECTION OF BLADDER TUMOR N/A 04/05/2023   Procedure: TURBT (TRANSURETHRAL RESECTION OF BLADDER TUMOR);  Surgeon: Twylla Glendia BROCKS, MD;  Location: [facility];  Service: Urology;  Laterality: N/A;    SOCIAL HISTORY:   Social History   Tobacco Use   Smoking status: Former    Current packs/day: 0.00    Types: Cigarettes    Quit date: 03/24/2001    Years since quitting: 22.9   Smokeless tobacco: Never  Substance Use Topics   Alcohol use: No    Alcohol/week: 0.0 standard drinks of alcohol    FAMILY HISTORY:   Family History  Problem Relation Age of Onset  Heart disease Mother    Heart disease Father    CAD Other     DRUG ALLERGIES:  Allergies[1]  REVIEW OF SYSTEMS:   ROS As per history of present illness. All pertinent systems were reviewed above. Constitutional, HEENT, cardiovascular, respiratory, GI, GU, musculoskeletal, neuro, psychiatric, endocrine, integumentary and hematologic systems were reviewed and are otherwise negative/unremarkable except for positive findings mentioned above in the HPI.   MEDICATIONS AT HOME:   Prior to Admission medications  Medication Sig Start Date End Date Taking? Authorizing Provider  acetaminophen  (TYLENOL ) 325 MG tablet Take 2 tablets (650 mg total) by mouth every 6 (six) hours as needed for mild pain (or Fever >/= 101). 10/11/22   Jens Durand, MD  amLODipine  (NORVASC ) 10 MG tablet Take 10 mg by mouth daily. 09/16/23 09/15/24  [provider]  apixaban  (ELIQUIS ) 2.5 MG TABS tablet Take 2.5 mg by mouth 2 (two) times daily.    [provider]   aspirin  EC 81 MG tablet Take 1 tablet (81 mg total) by mouth daily. Swallow whole. 01/28/24   Lenon Marien CROME, MD  calcitRIOL  (ROCALTROL ) 0.25 MCG capsule Take 1 capsule (0.25 mcg total) by mouth daily. 12/24/23 03/23/24  Alexander, Natalie, DO  calcium  carbonate (TUMS) 500 MG chewable tablet Chew 2 tablets by mouth 3 (three) times daily with meals. 08/05/21   [provider]  cyanocobalamin  (VITAMIN B12) 500 MCG tablet Take 1 tablet (500 mcg total) by mouth daily. 12/24/23   Alexander, Natalie, DO  folic acid  (FOLVITE ) 1 MG tablet Take 1 tablet (1 mg total) by mouth daily. 12/24/23 03/23/24  Alexander, Natalie, DO  furosemide  (LASIX ) 80 MG tablet Take 80 mg by mouth daily. 01/13/24   [provider]  gabapentin  (NEURONTIN ) 300 MG capsule Take 300 mg by mouth at bedtime. 09/25/16   [provider]  hydrocortisone  (ANUSOL -HC) 2.5 % rectal cream Place 1 application rectally 2 (two) times daily as needed for hemorrhoids or anal itching.    [provider]  isosorbide  mononitrate (IMDUR ) 120 MG 24 hr tablet Take 1 tablet (120 mg total) by mouth 2 (two) times daily. 01/27/24 02/26/24  Lenon Marien CROME, MD  metoprolol  tartrate (LOPRESSOR ) 50 MG tablet Take 1.5 tablets (75 mg total) by mouth 2 (two) times daily. 01/27/24   Lenon Marien CROME, MD  nitroGLYCERIN  (NITROSTAT ) 0.4 MG SL tablet Place 0.4 mg under the tongue every 5 (five) minutes as needed for chest pain.    [provider]  omeprazole 20 MG TBDD disintegrating tablet Take 20 mg by mouth daily. 08/05/21   [provider]  ondansetron  (ZOFRAN ) 4 MG tablet Take 4 mg by mouth every 8 (eight) hours as needed. 01/16/24   [provider]  promethazine  (PHENERGAN ) 25 MG tablet Take 1 tablet (25 mg total) by mouth every 8 (eight) hours as needed for refractory nausea / vomiting. 12/24/23   Alexander, Natalie, DO  sevelamer  carbonate (RENVELA ) 800 MG tablet Take 800 mg by mouth 3 (three) times daily  with meals. 09/23/23   [provider]  sodium bicarbonate  650 MG tablet Take 2 tablets (1,300 mg total) by mouth 2 (two) times daily. 12/24/23 12/23/24  Marsa Edelman, DO      VITAL SIGNS:  Blood pressure (!) 133/108, pulse 90, temperature 97.6 F (36.4 C), temperature source Oral, resp. rate (!) 21, height 5' 9 (1.753 m), weight 59 kg, SpO2 93%.  PHYSICAL EXAMINATION:  Physical Exam  GENERAL:  74 y.o.-year-old patient lying in the bed  with no mild respiratory distress with conversational dyspnea. EYES: Pupils equal, round, reactive to light and accommodation. No scleral icterus. Extraocular muscles intact.  HEENT: Head atraumatic, normocephalic. Oropharynx and nasopharynx clear.  NECK:  Supple, no jugular venous distention. No thyroid  enlargement, no tenderness.  LUNGS: Diminished bibasilar breath sounds with bibasilar rales.  No use of accessory muscles of respiration.  CARDIOVASCULAR: Regular rate and rhythm, S1, S2 normal. No murmurs, rubs, or gallops.  ABDOMEN: Soft, nondistended, nontender. Bowel sounds present. No organomegaly or mass.  EXTREMITIES: No pedal edema, cyanosis, or clubbing.  NEUROLOGIC: Cranial nerves II through XII are intact. Muscle strength 5/5 in all extremities. Sensation intact. Gait not checked.  PSYCHIATRIC: The patient is alert and oriented x 3.  Normal affect and good eye contact. SKIN: No obvious rash, lesion, or ulcer.   LABORATORY PANEL:   CBC Recent Labs  Lab 03/01/24 0343  WBC 10.0  HGB 9.0*  HCT 28.0*  PLT 259   ------------------------------------------------------------------------------------------------------------------  Chemistries  Recent Labs  Lab 03/01/24 0343  NA 143  K 2.6*  CL 108  CO2 13*  GLUCOSE 190*  BUN 69*  CREATININE 8.46*  CALCIUM  7.0*  MG 1.9  AST 31  ALT 14  ALKPHOS 178*  BILITOT <0.2    ------------------------------------------------------------------------------------------------------------------  Cardiac Enzymes No results for input(s): TROPONINI in the last 168 hours. ------------------------------------------------------------------------------------------------------------------  RADIOLOGY:  DG Chest Portable 1 View Result Date: 03/01/2024 EXAM: 1 VIEW(S) XRAY OF THE CHEST 03/01/2024 04:02:00 AM COMPARISON: Portable chest 01/25/2024. CLINICAL HISTORY: SOB Shortness of breath. FINDINGS: LINES, TUBES AND DEVICES: Telemetry leads overlie the chest. LUNGS AND PLEURA: There are bilateral small pleural effusions with improvement compared with 01/25/2024. Development of mild interstitial edema in the lung bases. There are low lung volumes, continued patchy atelectasis or airspace disease in the hypoinflated lung bases. No pneumothorax. HEART AND MEDIASTINUM: Stable cardiomegaly. There is increased central vascular congestion. Mediastinum is stable with aortic atherosclerosis. BONES AND SOFT TISSUES: Osteopenia and degenerative change. IMPRESSION: 1. Increased central vascular congestion and development of mild interstitial edema in the lung bases. 2. Bilateral small pleural effusions, improved compared to 01/25/24. 3. Low inspiration with continued patchy atelectasis or infiltrates in the hypoinflated bases. 4. Stable cardiomegaly. Electronically signed by: Francis Quam MD 03/01/2024 04:15 AM EST RP Workstation: [id number]      IMPRESSION AND PLAN:  Assessment and Plan: * Fluid overload - This is associated with end-stage renal disease on peritoneal dialysis. - His pulmonary edema is likely secondary to noncompliance with PD. - He will be admitted to a progressive unit bed. - Nephrology consult to be obtained. - Dr. Douglas was notified about the patient. 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 - Will follow LFTs.  Chest pain -  He has associated elevated troponin concerning for ACS. - He will be continued on IV heparin . - 2D echo and cardiology consult will be obtained. - I notified Dr. Florencio about the patient.  Hypertensive urgency - The patient will be continued on his antihypertensive therapy. - Will place him on as needed IV labetalol  and hydralazine .  C. difficile enteritis Will restart his Dificid   Dyslipidemia - We will resume statin therapy.  Type 2 diabetes mellitus with end-stage renal disease (HCC) - The patient will be placed on supplemental coverage with NovoLog . - Would hold off metformin . - He will be placed on Neurontin  for diabetic neuropathy.  Depression - Will continue Lexapro .  Hypokalemia - Potassium was replaced and will  be followed and magnesium  level was checked and came back 1.9.    DVT prophylaxis: IV Heparin  Advanced Care Planning:  Code Status: He is DNR and DNI. Family Communication:  The plan of care was discussed in details with the patient (and family). I answered all questions. The patient agreed to proceed with the above mentioned plan. Further management will depend upon hospital course. Disposition Plan: Back to previous home environment Consults called: none.  All the records are reviewed and case discussed with ED provider.  Status is: Inpatient  At the time of the admission, it appears that the appropriate admission status for this patient is inpatient.  This is judged to be reasonable and necessary in order to provide the required intensity of service to ensure the patient's safety given the presenting symptoms, physical exam findings and initial radiographic and laboratory data in the context of comorbid conditions.  The patient requires inpatient status due to high intensity of service, high risk of further deterioration and high frequency of surveillance required.  I certify that at the time of admission, it is my clinical judgment that the patient will  require inpatient hospital care extending more than 2 midnights.                            Dispo: The patient is from: Home              Anticipated d/c is to: Home              Patient currently is not medically stable to d/c.              Difficult to place patient: No  Madison DELENA Peaches M.D on 03/01/2024 at 9:20 AM  [facility]   From 7 PM-7 AM, contact night-coverage [email or web address]  CC: Primary care physician; Valora Lynwood FALCON, MD     [1]  Allergies Allergen Reactions   Morphine  Anxiety    agitation    "

## 2024-03-02 ENCOUNTER — Other Ambulatory Visit: Payer: Self-pay

## 2024-03-02 DIAGNOSIS — E8770 Fluid overload, unspecified: Secondary | ICD-10-CM | POA: Diagnosis present

## 2024-03-02 LAB — CBC
HCT: 24.3 % — ABNORMAL LOW (ref 39.0–52.0)
Hemoglobin: 7.8 g/dL — ABNORMAL LOW (ref 13.0–17.0)
MCH: 29.9 pg (ref 26.0–34.0)
MCHC: 32.1 g/dL (ref 30.0–36.0)
MCV: 93.1 fL (ref 80.0–100.0)
Platelets: 213 10*3/uL (ref 150–400)
RBC: 2.61 MIL/uL — ABNORMAL LOW (ref 4.22–5.81)
RDW: 18 % — ABNORMAL HIGH (ref 11.5–15.5)
WBC: 8.4 10*3/uL (ref 4.0–10.5)
nRBC: 0 % (ref 0.0–0.2)

## 2024-03-02 LAB — COMPREHENSIVE METABOLIC PANEL WITH GFR
ALT: 11 U/L (ref 0–44)
AST: 20 U/L (ref 15–41)
Albumin: 2.6 g/dL — ABNORMAL LOW (ref 3.5–5.0)
Alkaline Phosphatase: 142 U/L — ABNORMAL HIGH (ref 38–126)
Anion gap: 15 (ref 5–15)
BUN: 59 mg/dL — ABNORMAL HIGH (ref 8–23)
CO2: 18 mmol/L — ABNORMAL LOW (ref 22–32)
Calcium: 7 mg/dL — ABNORMAL LOW (ref 8.9–10.3)
Chloride: 108 mmol/L (ref 98–111)
Creatinine, Ser: 7.59 mg/dL — ABNORMAL HIGH (ref 0.61–1.24)
GFR, Estimated: 7 mL/min — ABNORMAL LOW
Glucose, Bld: 135 mg/dL — ABNORMAL HIGH (ref 70–99)
Potassium: 2.9 mmol/L — ABNORMAL LOW (ref 3.5–5.1)
Sodium: 141 mmol/L (ref 135–145)
Total Bilirubin: <0.2 mg/dL (ref 0.0–1.2)
Total Protein: 4.7 g/dL — ABNORMAL LOW (ref 6.5–8.1)

## 2024-03-02 LAB — APTT
aPTT: 63 s — ABNORMAL HIGH (ref 24–36)
aPTT: 73 s — ABNORMAL HIGH (ref 24–36)

## 2024-03-02 LAB — HEPATITIS B SURFACE ANTIBODY, QUANTITATIVE: Hep B S AB Quant (Post): 911 m[IU]/mL

## 2024-03-02 LAB — HEPARIN LEVEL (UNFRACTIONATED): Heparin Unfractionated: 0.22 [IU]/mL — ABNORMAL LOW (ref 0.30–0.70)

## 2024-03-02 LAB — PHOSPHORUS: Phosphorus: 5.7 mg/dL — ABNORMAL HIGH (ref 2.5–4.6)

## 2024-03-02 LAB — MAGNESIUM: Magnesium: 1.7 mg/dL (ref 1.7–2.4)

## 2024-03-02 MED ORDER — APIXABAN 2.5 MG PO TABS: 2.5000 mg | ORAL_TABLET | Freq: Two times a day (BID) | ORAL | Status: DC

## 2024-03-02 MED ORDER — HEPARIN BOLUS VIA INFUSION
900.0000 [IU] | Freq: Once | INTRAVENOUS | Status: AC
  Administered 2024-03-02: 900 [IU] via INTRAVENOUS
  Filled 2024-03-02: qty 900

## 2024-03-02 MED ORDER — VANCOMYCIN HCL 125 MG PO CAPS
125.0000 mg | ORAL_CAPSULE | Freq: Four times a day (QID) | ORAL | Status: DC
  Filled 2024-03-02 (×2): qty 1

## 2024-03-02 MED ORDER — FUROSEMIDE 80 MG PO TABS
ORAL_TABLET | ORAL | 0 refills | Status: AC
  Filled 2024-03-02: qty 40, 35d supply, fill #0

## 2024-03-02 MED ORDER — VANCOMYCIN HCL 125 MG PO CAPS
125.0000 mg | ORAL_CAPSULE | Freq: Four times a day (QID) | ORAL | 0 refills | Status: AC
  Filled 2024-03-02: qty 40, 10d supply, fill #0

## 2024-03-02 MED ORDER — MAGNESIUM SULFATE 2 GM/50ML IV SOLN
2.0000 g | Freq: Once | INTRAVENOUS | Status: AC
  Administered 2024-03-02: 2 g via INTRAVENOUS
  Filled 2024-03-02: qty 50

## 2024-03-02 MED ORDER — POTASSIUM CHLORIDE CRYS ER 20 MEQ PO TBCR
40.0000 meq | EXTENDED_RELEASE_TABLET | Freq: Once | ORAL | Status: AC
  Administered 2024-03-02: 40 meq via ORAL
  Filled 2024-03-02: qty 2

## 2024-03-02 NOTE — Progress Notes (Cosign Needed)
 " [facility] CLINIC CARDIOLOGY PROGRESS NOTE       Patient ID: Casey French MRN: [id number] DOB/AGE: [birth year] 73 y.o.  Admit date: 03/01/2024 Referring Physician Dr. Madison Peaches Primary Physician Valora Lynwood FALCON, MD  Primary Cardiologist Therisa Pierre, PA  Reason for Consultation AoCHF, elevated troponin  HPI: Casey French is a 74 y.o. male  with a past medical history of coronary artery disease s/p multiple stents most recently 2017 @ [facility], paroxysmal atrial fibrillation, hypertension, hyperlipidemia, type II diabetes, end-stage renal disease on peritoneal dialysis who presented to the ED on 03/01/2024 for SOB and CP. Cardiology was consulted for further evaluation.   Interval history: -Patient seen and examined this AM, resting in hospital bed.  -Completed PD session overnight, reports feeling much better today.  -Denies any CP, SOB. No events noted on tele.   Review of systems complete and found to be negative unless listed above    Past Medical History:  Diagnosis Date   Acute diverticulitis 06/14/2014   Acute ST elevation myocardial infarction (STEMI) of inferior wall (HCC) 2003   a.) PCI with Cypher stent to the RCA   Anemia of chronic renal failure    Aortic atherosclerosis    Bladder tumor    CAP (community acquired pneumonia) 06/2022   Cerebral microvascular disease    Coronary artery disease involving native coronary artery of native heart without angina pectoris 2003   a.) PCI with Cypher stent to the RCA 2003; b.) LHC 08/2003: 100% dRCA, 60% pLAD - med mgmt; c.) LHC/PCI 08/21/2015: occluded RCA at site of prior stent placement, 70% mLAD (FFR 0.79; 3.5 x 12 mm Xience DES), 50% dLAD (FFR 0.70); d.) MV 11/11/2016: no ischemia   Depression    Diverticulosis    End-stage renal disease on peritoneal dialysis (HCC)    GERD (gastroesophageal reflux disease)    GI bleed 05/15/2019   Gout    Heart murmur    Hematemesis 05/15/2019   Hepatic cyst    Hepatomegaly     Hyperlipidemia    Hypertension    Lacunar infarction (HCC) 02/11/2007   a.) CT head 02/11/2007: lacunar infarction involving thalamus and external capsule region on the RIGHT; b.) MRI brain 02/03/2020: multiple remote lacunar infarcts involving the RIGHT basal ganglia, RIGHT thalamus, and LEFT middle cerebellar peduncle   Long-term use of aspirin  therapy    Myocardial bridge    On apixaban  therapy    Pancolitis 09/2022   Paroxysmal atrial fibrillation (HCC)    a.) CHA2DS2VASc = 6 (age, HTN, CVA x 2, prior MI/vascular disease, T2DM) as of 03/11/2023; b.) rate/rhythm maintained on oral metoprolol  succinate; chronically anticoagulated with dose reduced apixaban    Renal cyst, right    Sigmoid diverticulitis 03/2022   T2DM (type 2 diabetes mellitus) (HCC)    Traumatic skull fracture and resulting subarachnoid hemorrhage 03/24/2013   a.) MOI was slipping and falling on ice   Unstable angina [facility])     Past Surgical History:  Procedure Laterality Date   BLADDER INSTILLATION N/A 04/05/2023   Procedure: BLADDER INSTILLATION OF GEMCITABINE ;  Surgeon: Twylla Glendia BROCKS, MD;  Location: [facility] ORS;  Service: Urology;  Laterality: N/A;   CAPD INSERTION N/A 08/13/2020   Procedure: LAPAROSCOPIC INSERTION CONTINUOUS AMBULATORY PERITONEAL DIALYSIS  (CAPD) CATHETER;  Surgeon: Lane Shope, MD;  Location: [facility] ORS;  Service: General;  Laterality: N/A;   CARDIAC CATHETERIZATION     CATARACT EXTRACTION W/PHACO Right 07/05/2019   Procedure: CATARACT EXTRACTION PHACO AND INTRAOCULAR LENS PLACEMENT (IOC)  RIGHT DIABETIC;  Surgeon: Jaye Fallow, MD;  Location: [facility] ORS;  Service: Ophthalmology;  Laterality: Right;  US00:59.3 CDE8.41 LOT [id number] h   COLONOSCOPY WITH PROPOFOL  N/A 04/01/2017   Procedure: COLONOSCOPY WITH PROPOFOL ;  Surgeon: Gaylyn Gladis PENNER, MD;  Location: [facility] ENDOSCOPY;  Service: Endoscopy;  Laterality: N/A;   CORONARY ANGIOPLASTY WITH STENT PLACEMENT  10/2001   RCA   CORONARY ANGIOPLASTY  WITH STENT PLACEMENT Left 08/21/2015   Procedure: CORONARY ANGIOPLASTY WITH STENT PLACEMENT (mid LAD); Location: [facility]; Surgeon: Zachary Car, MD   ESOPHAGOGASTRODUODENOSCOPY N/A 04/24/2023   Procedure: EGD (ESOPHAGOGASTRODUODENOSCOPY);  Surgeon: Toledo, Ladell POUR, MD;  Location: [facility] ENDOSCOPY;  Service: Gastroenterology;  Laterality: N/A;   HAND SURGERY  2015   3 screws in right hand   TOTAL HIP ARTHROPLASTY Left 04/22/2020   Procedure: TOTAL HIP ARTHROPLASTY ANTERIOR APPROACH;  Surgeon: Kathlynn Sharper, MD;  Location: [facility] ORS;  Service: Orthopedics;  Laterality: Left;   TRANSURETHRAL RESECTION OF BLADDER TUMOR N/A 04/05/2023   Procedure: TURBT (TRANSURETHRAL RESECTION OF BLADDER TUMOR);  Surgeon: Twylla Glendia BROCKS, MD;  Location: [facility] ORS;  Service: Urology;  Laterality: N/A;    Medications Prior to Admission  Medication Sig Dispense Refill Last Dose/Taking   amitriptyline (ELAVIL) 10 MG tablet Take 10 mg by mouth at bedtime.   02/29/2024   amLODipine  (NORVASC ) 10 MG tablet Take 10 mg by mouth daily.   02/29/2024   apixaban  (ELIQUIS ) 2.5 MG TABS tablet Take 2.5 mg by mouth 2 (two) times daily.   02/29/2024   aspirin  EC 81 MG tablet Take 1 tablet (81 mg total) by mouth daily. Swallow whole. 30 tablet 0 02/29/2024   calcium  carbonate (TUMS) 500 MG chewable tablet Chew 2 tablets by mouth 3 (three) times daily with meals.   02/29/2024   cyanocobalamin  (VITAMIN B12) 500 MCG tablet Take 1 tablet (500 mcg total) by mouth daily. 30 tablet 0 02/29/2024   folic acid  (FOLVITE ) 1 MG tablet Take 1 tablet (1 mg total) by mouth daily. 30 tablet 0 02/29/2024   furosemide  (LASIX ) 80 MG tablet Take 80 mg by mouth daily.   02/29/2024   gabapentin  (NEURONTIN ) 300 MG capsule Take 300 mg by mouth at bedtime.  11 02/29/2024   isosorbide  mononitrate (IMDUR ) 120 MG 24 hr tablet Take 1 tablet (120 mg total) by mouth 2 (two) times daily. 60 tablet 0 02/29/2024   losartan  (COZAAR ) 100 MG tablet Take 100 mg by mouth daily.   02/29/2024    Metoprolol  Tartrate 75 MG TABS Take 1 tablet by mouth 2 (two) times daily.   02/29/2024   nitroGLYCERIN  (NITROSTAT ) 0.4 MG SL tablet Place 0.4 mg under the tongue every 5 (five) minutes as needed for chest pain.   Taking As Needed   ondansetron  (ZOFRAN ) 4 MG tablet Take 4 mg by mouth every 8 (eight) hours as needed.   Taking As Needed   promethazine  (PHENERGAN ) 12.5 MG tablet Take 12.5 mg by mouth every 6 (six) hours as needed.   Taking As Needed   rosuvastatin  (CRESTOR ) 40 MG tablet Take 40 mg by mouth daily.   02/29/2024   sodium bicarbonate  650 MG tablet Take 2 tablets (1,300 mg total) by mouth 2 (two) times daily. 120 tablet 0 02/29/2024   Vitamin D , Ergocalciferol , (DRISDOL) 1.25 MG (50000 UNIT) CAPS capsule Take 50,000 Units by mouth once a week.   Taking   acetaminophen  (TYLENOL ) 325 MG tablet Take 2 tablets (650 mg total) by mouth every 6 (six) hours as needed for mild  pain (or Fever >/= 101).      allopurinol  (ZYLOPRIM ) 100 MG tablet Take 100 mg by mouth daily.   Unknown   calcitRIOL  (ROCALTROL ) 0.25 MCG capsule Take 1 capsule (0.25 mcg total) by mouth daily. 30 capsule 0 Unknown   hydrocortisone  (ANUSOL -HC) 2.5 % rectal cream Place 1 application rectally 2 (two) times daily as needed for hemorrhoids or anal itching.      metoprolol  tartrate (LOPRESSOR ) 50 MG tablet Take 1.5 tablets (75 mg total) by mouth 2 (two) times daily. (Patient not taking: Reported on 03/01/2024) 90 tablet 0 Not Taking   sevelamer  carbonate (RENVELA ) 800 MG tablet Take 800 mg by mouth 3 (three) times daily with meals. (Patient not taking: Reported on 03/01/2024)   Not Taking   Social History   Socioeconomic History   Marital status: Married    Spouse name: Sherri   Number of children: 2   Years of education: Not on file   Highest education level: Not on file  Occupational History   Not on file  Tobacco Use   Smoking status: Former    Current packs/day: 0.00    Types: Cigarettes    Quit date: 03/24/2001    Years  since quitting: 22.9   Smokeless tobacco: Never  Vaping Use   Vaping status: Never Used  Substance and Sexual Activity   Alcohol use: No    Alcohol/week: 0.0 standard drinks of alcohol   Drug use: No   Sexual activity: Not on file  Other Topics Concern   Not on file  Social History Narrative   Lives with wife   Social Drivers of Health   Tobacco Use: Medium Risk (03/01/2024)   Patient History    Smoking Tobacco Use: Former    Smokeless Tobacco Use: Never    Passive Exposure: Not on file  Financial Resource Strain: Medium Risk (11/28/2023)   Received from [facility]   Overall Financial Resource Strain (CARDIA)    Difficulty of Paying Living Expenses: Somewhat hard  Food Insecurity: Food Insecurity Present (03/01/2024)   Epic    Worried About Programme Researcher, Broadcasting/film/video in the Last Year: Sometimes true    Ran Out of Food in the Last Year: Never true  Transportation Needs: No Transportation Needs (03/01/2024)   Epic    Lack of Transportation (Medical): No    Lack of Transportation (Non-Medical): No  Physical Activity: Not on file  Stress: Not on file  Social Connections: Socially Isolated (03/01/2024)   Social Connection and Isolation Panel    Frequency of Communication with Friends and Family: Once a week    Frequency of Social Gatherings with Friends and Family: Never    Attends Religious Services: Never    Database Administrator or Organizations: No    Attends Banker Meetings: Never    Marital Status: Married  Catering Manager Violence: Not At Risk (03/01/2024)   Epic    Fear of Current or Ex-Partner: No    Emotionally Abused: No    Physically Abused: No    Sexually Abused: No  Depression (PHQ2-9): Not on file  Alcohol Screen: Not on file  Housing: Low Risk (03/01/2024)   Epic    Unable to Pay for Housing in the Last Year: No    Number of Times Moved in the Last Year: 0    Homeless in the Last Year: No  Utilities: Not At Risk (03/01/2024)   Epic     Threatened with loss of  utilities: No  Health Literacy: Not on file    Family History  Problem Relation Age of Onset   Heart disease Mother    Heart disease Father    CAD Other      Vitals:   03/02/24 0036 03/02/24 0130 03/02/24 0500 03/02/24 0556  BP: (!) 162/91 132/61  (!) 151/79  Pulse: 71   70  Resp:  (!) 21  20  Temp:    97.7 F (36.5 C)  TempSrc:    Oral  SpO2:    98%  Weight:   61.1 kg   Height:        PHYSICAL EXAM General: Chronically ill appearing male, well nourished, in no acute distress. HEENT: Normocephalic and atraumatic. Neck: No JVD.  Lungs: Normal respiratory effort on room air. Bilateral crackles Heart: HRRR. Normal S1 and S2 without gallops or murmurs.  Abdomen: Non-distended appearing.  Msk: Normal strength and tone for age. Extremities: Warm and well perfused. No clubbing, cyanosis. Trace edema.  Neuro: Alert and oriented X 3. Psych: Answers questions appropriately.   Labs: Basic Metabolic Panel: Recent Labs    03/01/24 0343 03/01/24 1055 03/02/24 0415  NA 143 144 141  K 2.6* 3.2* 2.9*  CL 108 112* 108  CO2 13* 15* 18*  GLUCOSE 190* 86 135*  BUN 69* 65* 59*  CREATININE 8.46* 8.24* 7.59*  CALCIUM  7.0* 6.9* 7.0*  MG 1.9  --  1.7  PHOS  --   --  5.7*   Liver Function Tests: Recent Labs    03/01/24 1055 03/02/24 0415  AST 28 20  ALT 12 11  ALKPHOS 159* 142*  BILITOT <0.2 <0.2  PROT 5.4* 4.7*  ALBUMIN 3.0* 2.6*   Recent Labs    03/01/24 0343  LIPASE 49   CBC: Recent Labs    03/01/24 0343 03/01/24 1055 03/02/24 0415  WBC 10.0 10.0 8.4  NEUTROABS 8.4* 8.4*  --   HGB 9.0* 8.4* 7.8*  HCT 28.0* 25.9* 24.3*  MCV 92.4 93.2 93.1  PLT 259 220 213   Cardiac Enzymes: No results for input(s): CKTOTAL, CKMB, CKMBINDEX, TROPONINIHS in the last 72 hours. BNP: No results for input(s): BNP in the last 72 hours. D-Dimer: No results for input(s): DDIMER in the last 72 hours. Hemoglobin A1C: No results for input(s):  HGBA1C in the last 72 hours. Fasting Lipid Panel: No results for input(s): CHOL, HDL, LDLCALC, TRIG, CHOLHDL, LDLDIRECT in the last 72 hours. Thyroid  Function Tests: No results for input(s): TSH, T4TOTAL, T3FREE, THYROIDAB in the last 72 hours.  Invalid input(s): FREET3 Anemia Panel: No results for input(s): VITAMINB12, FOLATE, FERRITIN, TIBC, IRON , RETICCTPCT in the last 72 hours.   Radiology: DG Chest Portable 1 View Result Date: 03/01/2024 EXAM: 1 VIEW(S) XRAY OF THE CHEST 03/01/2024 04:02:00 AM COMPARISON: Portable chest 01/25/2024. CLINICAL HISTORY: SOB Shortness of breath. FINDINGS: LINES, TUBES AND DEVICES: Telemetry leads overlie the chest. LUNGS AND PLEURA: There are bilateral small pleural effusions with improvement compared with 01/25/2024. Development of mild interstitial edema in the lung bases. There are low lung volumes, continued patchy atelectasis or airspace disease in the hypoinflated lung bases. No pneumothorax. HEART AND MEDIASTINUM: Stable cardiomegaly. There is increased central vascular congestion. Mediastinum is stable with aortic atherosclerosis. BONES AND SOFT TISSUES: Osteopenia and degenerative change. IMPRESSION: 1. Increased central vascular congestion and development of mild interstitial edema in the lung bases. 2. Bilateral small pleural effusions, improved compared to 01/25/24. 3. Low inspiration with continued patchy atelectasis or infiltrates in  the hypoinflated bases. 4. Stable cardiomegaly. Electronically signed by: Francis Quam MD 03/01/2024 04:15 AM EST RP Workstation: [id number]    ECHO 11/2023: 1. Left ventricular ejection fraction, by estimation, is 60 to 65%. The left ventricle has normal function. The left ventricle has no regional wall motion abnormalities. Left ventricular diastolic parameters are consistent with Grade II diastolic dysfunction (pseudonormalization).   2. Right ventricular systolic function is normal.  The right ventricular  size is normal.   3. The mitral valve is normal in structure. Mild mitral valve  regurgitation. No evidence of mitral stenosis.   4. The aortic valve is normal in structure. Aortic valve regurgitation is  not visualized. Aortic valve sclerosis/calcification is present, without  any evidence of aortic stenosis.   5. The inferior vena cava is normal in size with greater than 50%  respiratory variability, suggesting right atrial pressure of 3 mmHg.   TELEMETRY (personally reviewed): sinus rhythm rate 70s  EKG (personally reviewed): NSR rate 91 bpm  Data reviewed by me 03/02/2024: last 24h vitals tele labs imaging I/O ED provider note, admission H&P, hospitalist progress note  Principal Problem:   Fluid overload Active Problems:   Hypertensive urgency   Chest pain   Hypokalemia   Dyslipidemia   Depression   Type 2 diabetes mellitus with end-stage renal disease (HCC)   C. difficile enteritis    ASSESSMENT AND PLAN:  Casey French is a 74 y.o. male  with a past medical history of coronary artery disease s/p multiple stents most recently 2017 @ [facility], paroxysmal atrial fibrillation, hypertension, hyperlipidemia, type II diabetes, end-stage renal disease on peritoneal dialysis who presented to the ED on 03/01/2024 for SOB and CP. Cardiology was consulted for further evaluation.   # Acute on chronic HFpEF  # Atrial fibrillation RVR # Paroxysmal atrial fibrillation # Coronary artery disease # Demand ischemia Patient presented for evaluation of SOB, CP after skipping PD since Monday. Started on IV lasix  in the ED and underwent PD session but did not complete this. Still makes urine.  Echo 11/2023 with preserved EF. - Continue IV Lasix  60 mg twice daily.  Nephrology following, appreciate their recommendations. - Continue home Imdur  to 120 mg daily, amlodipine  10 mg daily, metoprolol  tartrate 75 mg twice daily. - Can continue IV heparin  until 48 hours have been  completed (03/03/24 at 0600 - stop time added to order), then we will plan to transition back to home Eliquis  2.5 mg twice daily (weight and renal function).  -Mild and flat troponins most consistent with demand/supply mismatch and not ACS in the setting of acute heart failure after missed dialysis sessions.  No further cardiac diagnostics recommended at this time.   This patient's plan of care was discussed and created with Dr. Florencio and he is in agreement.  Signed: Danita Bloch, PA-C  03/02/2024, 8:55 AM [facility] Card[facility]      "

## 2024-03-02 NOTE — Discharge Instructions (Addendum)
 Food Resources  Agency Name: [facility] Agency Address: [address], [place], [place] [address] Phone: [phone] Website: [email or web address] Service(s) Offered: Housing services, self-sufficiency, congregate meal program, weatherization program, Event organiser program, emergency food assistance,  housing counseling, home ownership program, wheels - to work program.  [employer] free for 60 and older at various locations from [employer], Monday-Friday:  [employer], [address] [place], [phone] -[facility], [address], [place] [phone]  -[facility], [address], [place] [phone]  -[address] [place], [phone]  Agency Name: [facility] Address: [id number], [place], [place] [address] Phone: [phone] Website: [email or web address] Service(s) Offered: Home delivered hot, frozen, and emergency  meals. Grocery assistance program which matches  volunteers one-on-one with seniors unable to grocery shop  for themselves. Must be 60 years and older; less than 20  hours of in-home aide service, limited or no driving ability;  live alone or with someone with a disability; live in  [place].  Agency Name: [facility] [employer]) Address: [address] [place], [place] [address] Phone: [phone] Service(s) Offered: Food is served to shut-ins, homeless, elderly, and low income people in the community every Saturday (11:30 am-12:30 pm) and Sunday (12:30 pm-1:30pm). Volunteers also offer help and encouragement in seeking employment,  and spiritual guidance.  Agency Name: [employer] Address: [address] [place], [place] [address] Phone: [phone] Service(s) Offered: Child support services; child welfare services; food stamps; Medicaid; work first family assistance; and aid  with fuel,  rent, food and medicine.  Agency Name: [employer] Address: [address], [place], [place] Phone: [phone] Website: [email or web address] Services Offered: Monday 10:00am-12:00, 8:00pm-9:00pm, and Friday 10:00am-12:00.  Agency Name: [employer] [facility] Address: [address] [place], [place] [address] Phone: [phone] Website: [email or web address] Service(s) Offered: Serves weekday meals, open from 11:30 am- 1:00 pm., and 6:30-7:30pm, Monday-Wednesday-Friday distributes food 3:30-6pm, Monday-Wednesday-Friday.  Agency Name: [facility] Address: [address], [place], [place] Phone: [phone] Website: [email or web address] Services Offered: Distributes food the 4th Saturday of the month, starting at 8:00 am  Agency Name: [facility] Address: [phone], [place], [place] [address] Phone: [phone] Website: [email or web address] Service(s) Offered: [employer] life, weekly food pantry. Open Wednesdays from 10:00am-noon.  Agency Name: [facility] [employer] Address: [address] [place], [place], [place] Phone: [phone] Services Offered: Distributes food 9am-1pm, Monday-Thursday. Call for details.  Agency Name: [facility] Address: [address], [place], [place] [address] Phone: [phone] Website: [email or web address] Service(s) Offered: Games developer. Call for assistance.  Agency Name: [facility] Address: [address] [place], [place] [address] Phone: [phone] Service Offered: Emergency Food Pantry. Call for appointment.  Agency Name: [facility] Address: [address], [place], [place] [address] Phone: [phone] Website: [email or web address] Services Offered: Games developer. Call for details  Agency Name: [facility] Address: [address] [place], [place] Phone:  [phone] Website: [email or web address] Service(s) Offered: Emergency Food Pantry. Call for details.  Agency Name: Holiday representative Address: [address] [place], [place] [address] Phone: [phone] or [phone] Website: [email or web address] Service(s) Offered: Distribute food 9am-11:30 am, Tuesday-Friday, and 1-3:30pm, Monday-Friday. Food pantry Monday-Friday 1pm-3pm, fresh items, Mon.-Wed.-Fri.  Agency Name: [facility] Address: [address] [place], [place] [address] Phone: [phone] Website: [email or web address] Services Offered: Distribute food Tues and Sats from 9:00am-noon.  Closed 1st Saturday of each month. Call for details  Agency Name: [employer] Address: Fayrene Boatman [facility] [address] [place] [address] Phone: [phone]  Services Offered: Delivers meals every Thursday   Do you feel isolated?  [facility] [employer] [facility] offers a [employer] that anyone can call toll free at [phone]. The friendship line is available 24 hours a day  [employer] is a Program of All-inclusive Care for the Elderly (PACE). Their mission is to promote and sustain the independence of seniors wishing to remain in the community. They provide seniors with comprehensive Boehner-term health, social, medical and dietary care. Their program is a safe alternative to nursing home care. [phone]  [facility] Address [facility] [address] [place], [place] [address] Phone: [phone]. . Online zoom yoga class, connect with others without leaving your home [facility] offers [place] dance cardio sessions for individuals via Zoom. This program provides: - Dance fitness activities Please contact program for more information. Servinganyone in need adults 18+ hiv/aids individuals families Call [phone]  Email [email or web address] to get more info  [employer]  offers an online [employer] to individuals where they can receive help to focus on their best health. Whether you're a [employer] member or not, the neighborhood center offers a... Main Serviceshealth education  exercise & fitness  community support services  recreation  virtual support Other Servicessupport groups Servinganyone in need adults young adults teens seniors individuals families [email or web address] to get more info  Schedule on their website  [facility] offers an array of activities for adults age 8 and over. This program provides:- Fitness and health programs- Tech classes- Activity books Main Serviceshealth education  community support services  exercise & fitness  recreation  more education Servingseniors  Call [phone]    For more resources go online to [email or web address] and type in you zipcode  [facility]  POST OPERATIVE INSTRUCTIONS FOR DR. ASHLEY AND DR. BAKER [facility] CLINIC PODIATRY DEPARTMENT   Take your medication as prescribed.  Pain medication should be taken only as needed.  Keep the dressing clean, dry and intact.  Keep your foot elevated above the heart level for the first 48 hours.  Walking to the bathroom and brief periods of walking are acceptable, unless we have instructed you to be non-weight bearing.  Always wear your post-op shoe when walking.  Always use your crutches if you are to be non-weight bearing.  Do not take a shower. Baths are permissible as [place] as the foot is kept out of the water.   Every hour you are awake:  Bend your knee 15 times. Flex foot 15 times Massage calf 15 times  Call [facility] [phone]) if any of the following problems occur: You develop a temperature or fever. The bandage becomes saturated with blood. Medication does not stop your pain. Injury of the foot occurs. Any symptoms of infection including  redness, odor, or red streaks running from wound.

## 2024-03-02 NOTE — Progress Notes (Signed)
 Pt's bp was 131/113 checked three different times with different dynamaps. IV PRN Hydralazine  given and 2 hours later his bp was 126/79. Asymptomatic the whole time.

## 2024-03-02 NOTE — Discharge Summary (Signed)
 [address]    DELVECCHIO MADOLE FMW:[id number] DOB: [birth year] DOA: 03/01/2024  PCP: Valora Lynwood FALCON, MD  Admit date: 03/01/2024 Discharge date: 03/02/2024   Recommendations for Outpatient Follow-up:  Follow up with PCP in 1-2 weeks to continue chronic condition management.  Wean off oxygen as able  Hospital Course: Casey French is a 74 year old male with CAD, grade 2 diastolic dysfunction, ESRD on peritoneal dialysis, recent C. difficile, not taking his Dificid  due to cost, hypertension, dyslipidemia, paroxysmal A-fib, gout, who presented to the emergency department with dyspnea and chest pain.  Patient has not been performing peritoneal dialysis at home regularly.  Has not had PD in over 3 days.  Patient was volume overloaded with a proBNP of 32,000, and high-sensitivity troponin over 400.  Cardiology was consulted and patient was started on heparin .  Nephrology was consulted.  Patient was resumed on peritoneal dialysis After first session of peritoneal dialysis patient reported feeling much better.  He remains mildly volume overloaded but is responsive to diuretics and PD I had extensive discussions with the patient as well as with his family members regarding his inconsistent PD at home.  Patient reports sometimes he just does not feel like performing peritoneal dialysis.  We offered the patient to transition to hemodialysis and he has refused.  We also offered to speak with palliative care and consider hospice but patient reports he is going to try to stick to a peritoneal dialysis schedule instead. On 2/6 patient returned to his physiologic baseline.  He does continue to desaturate with exertion, some of this hypoxia may be acute secondary to his volume overload.  He will need to take higher dose of Lasix  for 5 additional days.  I do suspect some of his chronic deconditioning is playing a role in his desaturation and thus we have arranged for home O2 at [place].  Was offered home  health services but declined  Peritoneal dialysis Medication noncompliance Volume overload - Offered patient to transition to hemodialysis.  Also discussed possibility of palliative care versus hospice.  Patient would like to continue current treatment plan.  He is not interested in transitioning to hemodialysis or hospice at this time.  He wants to continue receiving PD at home.  Reiterated the importance of taking all medications as prescribed and daily PD.  He endorses understanding. - Lasix  p.o. 80 mg twice daily for 5 days then return to 80 daily  Hypoxia - Continues to desaturate with ambulation.  Likely multifactorial in setting of volume overload and generalized deconditioning. Home O2 arranged.  This may not be permanent, he may require further diuresis.  Patient will need to continue to wean off oxygen outpatient with his primary care team.   C. difficile infection - Patient has not been taking Dificid  at home as it is cost prohibitive.  We were able to switch him to p.o. vancomycin  which he reports is affordable.  This was delivered to bedside via meds to beds prior to DC  Elevated troponin Chest pain - Cardiology evaluated the patient.  Believes all chest pain is secondary to volume overload.  It did resolve with further peritoneal dialysis. - Resume home medications.  Follow-up closely with cardiology outpatient  Also evaluated and treated for during this admission: Hypertension Dyslipidemia Type 2 diabetes Depression Hypokalemia Hypomagnesemia Hypoalbuminemia Hyperphosphatemia Hypocalcemia Chronic anemia  Discharge Instructions  Discharge Instructions     Call MD for:  difficulty breathing, headache or visual disturbances   Complete by: As directed  Call MD for:  persistant dizziness or light-headedness   Complete by: As directed    Call MD for:  persistant nausea and vomiting   Complete by: As directed    Call MD for:  severe uncontrolled pain   Complete by:  As directed    Call MD for:  temperature >100.4   Complete by: As directed    Diet general   Complete by: As directed    Discharge instructions   Complete by: As directed    Please follow up with your primary care team to discuss oxygen weaning.  Follow-up closely with nephrology as scheduled.  Please continue peritoneal dialysis every day as intended.  You have been sent home on a higher dose of Lasix  for the next 5 days.  Please take your Lasix  twice daily for the next 5 days and then you can resume taking once daily after that   Increase activity slowly   Complete by: As directed    No wound care   Complete by: As directed       Allergies as of 03/02/2024       Reactions   Morphine  Anxiety   agitation        Medication List     STOP taking these medications    sevelamer  carbonate 800 MG tablet Commonly known as: RENVELA        TAKE these medications    acetaminophen  325 MG tablet Commonly known as: TYLENOL  Take 2 tablets (650 mg total) by mouth every 6 (six) hours as needed for mild pain (or Fever >/= 101).   allopurinol  100 MG tablet Commonly known as: ZYLOPRIM  Take 100 mg by mouth daily.   amitriptyline 10 MG tablet Commonly known as: ELAVIL Take 10 mg by mouth at bedtime.   amLODipine  10 MG tablet Commonly known as: NORVASC  Take 10 mg by mouth daily.   apixaban  2.5 MG Tabs tablet Commonly known as: ELIQUIS  Take 2.5 mg by mouth 2 (two) times daily.   Aspirin  EC Adult Low Dose 81 MG tablet Generic drug: aspirin  EC Take 1 tablet (81 mg total) by mouth daily. Swallow whole.   calcitRIOL  0.25 MCG capsule Commonly known as: ROCALTROL  Take 1 capsule (0.25 mcg total) by mouth daily.   folic acid  1 MG tablet Commonly known as: FOLVITE  Take 1 tablet (1 mg total) by mouth daily.   furosemide  80 MG tablet Commonly known as: LASIX  Take 1 tablet (80 mg total) by mouth 2 (two) times daily for 5 days, THEN 1 tablet (80 mg total) daily. Start taking on:  March 02, 2024 What changed: See the new instructions.   gabapentin  300 MG capsule Commonly known as: NEURONTIN  Take 300 mg by mouth at bedtime.   hydrocortisone  2.5 % rectal cream Commonly known as: ANUSOL -HC Place 1 application rectally 2 (two) times daily as needed for hemorrhoids or anal itching.   isosorbide  mononitrate 120 MG 24 hr tablet Commonly known as: IMDUR  Take 1 tablet (120 mg total) by mouth 2 (two) times daily.   losartan  100 MG tablet Commonly known as: COZAAR  Take 100 mg by mouth daily.   Metoprolol  Tartrate 75 MG Tabs Take 1 tablet by mouth 2 (two) times daily. What changed: Another medication with the same name was removed. Continue taking this medication, and follow the directions you see here.   nitroGLYCERIN  0.4 MG SL tablet Commonly known as: NITROSTAT  Place 0.4 mg under the tongue every 5 (five) minutes as needed for chest pain.   ondansetron   4 MG tablet Commonly known as: ZOFRAN  Take 4 mg by mouth every 8 (eight) hours as needed.   promethazine  12.5 MG tablet Commonly known as: PHENERGAN  Take 12.5 mg by mouth every 6 (six) hours as needed.   rosuvastatin  40 MG tablet Commonly known as: CRESTOR  Take 40 mg by mouth daily.   sodium bicarbonate  650 MG tablet Take 2 tablets (1,300 mg total) by mouth 2 (two) times daily.   Tums 500 MG chewable tablet Generic drug: calcium  carbonate Chew 2 tablets by mouth 3 (three) times daily with meals.   vancomycin  125 MG capsule Commonly known as: VANCOCIN  Take 1 capsule (125 mg total) by mouth 4 (four) times daily for 10 days.   vitamin B-12 500 MCG tablet Commonly known as: CYANOCOBALAMIN  Take 1 tablet (500 mcg total) by mouth daily.   Vitamin D  (Ergocalciferol ) 1.25 MG (50000 UNIT) Caps capsule Commonly known as: DRISDOL Take 50,000 Units by mouth once a week.               Durable Medical Equipment  (From admission, onward)           Start     Ordered   03/02/24 1431  For home use  only DME oxygen  Once       Question Answer Comment  Length of Need Lifetime   Mode or (Route) Nasal cannula   Liters per Minute 2   Frequency Continuous (stationary and portable oxygen unit needed)   Oxygen delivery system: Gas   Oxygen delivery system: Portable concentrator (POC)   Oxygen delivery system: Concentrator      03/02/24 1431            Follow-up Information     Clarisa Kung, PA-C. Go in 1 week(s).   Contact information: [address] [facility] [place] [address] [phone]                Allergies[1]  Consultations: Treatment Team:  Douglas Rule, MD Florencio Cara BIRCH, MD   Procedures/Studies: DG Chest Portable 1 View Result Date: 03/01/2024 EXAM: 1 VIEW(S) XRAY OF THE CHEST 03/01/2024 04:02:00 AM COMPARISON: Portable chest 01/25/2024. CLINICAL HISTORY: SOB Shortness of breath. FINDINGS: LINES, TUBES AND DEVICES: Telemetry leads overlie the chest. LUNGS AND PLEURA: There are bilateral small pleural effusions with improvement compared with 01/25/2024. Development of mild interstitial edema in the lung bases. There are low lung volumes, continued patchy atelectasis or airspace disease in the hypoinflated lung bases. No pneumothorax. HEART AND MEDIASTINUM: Stable cardiomegaly. There is increased central vascular congestion. Mediastinum is stable with aortic atherosclerosis. BONES AND SOFT TISSUES: Osteopenia and degenerative change. IMPRESSION: 1. Increased central vascular congestion and development of mild interstitial edema in the lung bases. 2. Bilateral small pleural effusions, improved compared to 01/25/24. 3. Low inspiration with continued patchy atelectasis or infiltrates in the hypoinflated bases. 4. Stable cardiomegaly. Electronically signed by: Francis Quam MD 03/01/2024 04:15 AM EST RP Workstation: [id number]      Discharge Exam: Vitals:   03/02/24 1436 03/02/24 1637  BP: 126/79 132/74  Pulse: 89 92  Resp:  18  Temp:   (!) 97.5 F (36.4 C)  SpO2: 99% 95%   Vitals:   03/02/24 0904 03/02/24 1219 03/02/24 1436 03/02/24 1637  BP: (!) 153/135 (!) 131/113 126/79 132/74  Pulse: 77 75 89 92  Resp: 17   18  Temp: 98.2 F (36.8 C)   (!) 97.5 F (36.4 C)  TempSrc:    Oral  SpO2: 97%  99% 95%  Weight:      Height:        Constitutional:  Normal appearance. Non toxic-appearing.  Cardiovascular: Rate and Rhythm: Normal rate and regular rhythm.  Pulmonary: Non labored, symmetric rise of chest wall.  Abdomen: Nondistended, nontender to palpation Neurological alert. Oriented.  Psychiatric: Mood and Affect congruent.    The results of significant diagnostics from this hospitalization (including imaging, microbiology, ancillary and laboratory) are listed below for reference.     Microbiology: Recent Results (from the past 240 hours)  Resp panel by RT-PCR (RSV, Flu A&B, Covid) Anterior Nasal Swab     Status: None   Collection Time: 03/01/24  3:43 AM   Specimen: Anterior Nasal Swab  Result Value Ref Range Status   SARS Coronavirus 2 by RT PCR NEGATIVE NEGATIVE Final    Comment: (NOTE) SARS-CoV-2 target nucleic acids are NOT DETECTED.  The SARS-CoV-2 RNA is generally detectable in upper respiratory specimens during the acute phase of infection. The lowest concentration of SARS-CoV-2 viral copies this assay can detect is 138 copies/mL. A negative result does not preclude SARS-Cov-2 infection and should not be used as the sole basis for treatment or other patient management decisions. A negative result may occur with  improper specimen collection/handling, submission of specimen other than nasopharyngeal swab, presence of viral mutation(s) within the areas targeted by this assay, and inadequate number of viral copies(<138 copies/mL). A negative result must be combined with clinical observations, patient history, and epidemiological information. The expected result is Negative.  Fact Sheet for Patients:   bloggercourse.com  Fact Sheet for Healthcare Providers:  seriousbroker.it  This test is no t yet approved or cleared by the [place]  FDA and  has been authorized for detection and/or diagnosis of SARS-CoV-2 by FDA under an Emergency Use Authorization (EUA). This EUA will remain  in effect (meaning this test can be used) for the duration of the COVID-19 declaration under Section 564(b)(1) of the Act, 21 U.S.C.section 360bbb-3(b)(1), unless the authorization is terminated  or revoked sooner.       Influenza A by PCR NEGATIVE NEGATIVE Final   Influenza B by PCR NEGATIVE NEGATIVE Final    Comment: (NOTE) The Xpert Xpress SARS-CoV-2/FLU/RSV plus assay is intended as an aid in the diagnosis of influenza from Nasopharyngeal swab specimens and should not be used as a sole basis for treatment. Nasal washings and aspirates are unacceptable for Xpert Xpress SARS-CoV-2/FLU/RSV testing.  Fact Sheet for Patients: bloggercourse.com  Fact Sheet for Healthcare Providers: seriousbroker.it  This test is not yet approved or cleared by the [place]  FDA and has been authorized for detection and/or diagnosis of SARS-CoV-2 by FDA under an Emergency Use Authorization (EUA). This EUA will remain in effect (meaning this test can be used) for the duration of the COVID-19 declaration under Section 564(b)(1) of the Act, 21 U.S.C. section 360bbb-3(b)(1), unless the authorization is terminated or revoked.     Resp Syncytial Virus by PCR NEGATIVE NEGATIVE Final    Comment: (NOTE) Fact Sheet for Patients: bloggercourse.com  Fact Sheet for Healthcare Providers: seriousbroker.it  This test is not yet approved or cleared by the [place]  FDA and has been authorized for detection and/or diagnosis of SARS-CoV-2 by FDA under an Emergency Use  Authorization (EUA). This EUA will remain in effect (meaning this test can be used) for the duration of the COVID-19 declaration under Section 564(b)(1) of the Act, 21 U.S.C. section 360bbb-3(b)(1), unless the authorization is terminated or revoked.  Performed at  [facility], [address] [place], [place] [address]      Labs: BNP (last 3 results) Recent Labs    04/22/23 1206  BNP 168.8*   Basic Metabolic Panel: Recent Labs  Lab 03/01/24 0343 03/01/24 1055 03/02/24 0415  NA 143 144 141  K 2.6* 3.2* 2.9*  CL 108 112* 108  CO2 13* 15* 18*  GLUCOSE 190* 86 135*  BUN 69* 65* 59*  CREATININE 8.46* 8.24* 7.59*  CALCIUM  7.0* 6.9* 7.0*  MG 1.9  --  1.7  PHOS  --   --  5.7*   Liver Function Tests: Recent Labs  Lab 03/01/24 0343 03/01/24 1055 03/02/24 0415  AST 31 28 20   ALT 14 12 11   ALKPHOS 178* 159* 142*  BILITOT <0.2 <0.2 <0.2  PROT 5.9* 5.4* 4.7*  ALBUMIN 3.3* 3.0* 2.6*   Recent Labs  Lab 03/01/24 0343  LIPASE 49   No results for input(s): AMMONIA in the last 168 hours. CBC: Recent Labs  Lab 03/01/24 0343 03/01/24 1055 03/02/24 0415  WBC 10.0 10.0 8.4  NEUTROABS 8.4* 8.4*  --   HGB 9.0* 8.4* 7.8*  HCT 28.0* 25.9* 24.3*  MCV 92.4 93.2 93.1  PLT 259 220 213   Cardiac Enzymes: No results for input(s): CKTOTAL, CKMB, CKMBINDEX, TROPONINI in the last 168 hours. BNP: Invalid input(s): POCBNP CBG: No results for input(s): GLUCAP in the last 168 hours. D-Dimer No results for input(s): DDIMER in the last 72 hours. Hgb A1c No results for input(s): HGBA1C in the last 72 hours. Lipid Profile No results for input(s): CHOL, HDL, LDLCALC, TRIG, CHOLHDL, LDLDIRECT in the last 72 hours. Thyroid  function studies No results for input(s): TSH, T4TOTAL, T3FREE, THYROIDAB in the last 72 hours.  Invalid input(s): FREET3 Anemia work up No results for input(s): VITAMINB12, FOLATE, FERRITIN, TIBC,  IRON , RETICCTPCT in the last 72 hours. Urinalysis    Component Value Date/Time   COLORURINE COLORLESS (A) 03/01/2024 0400   APPEARANCEUR CLEAR (A) 03/01/2024 0400   APPEARANCEUR Clear 01/03/2024 1341   LABSPEC 1.005 03/01/2024 0400   PHURINE 7.0 03/01/2024 0400   GLUCOSEU >=500 (A) 03/01/2024 0400   HGBUR SMALL (A) 03/01/2024 0400   BILIRUBINUR NEGATIVE 03/01/2024 0400   BILIRUBINUR Negative 01/03/2024 1341   KETONESUR 5 (A) 03/01/2024 0400   PROTEINUR >=300 (A) 03/01/2024 0400   NITRITE NEGATIVE 03/01/2024 0400   LEUKOCYTESUR NEGATIVE 03/01/2024 0400   Sepsis Labs Recent Labs  Lab 03/01/24 0343 03/01/24 1055 03/02/24 0415  WBC 10.0 10.0 8.4   Microbiology Recent Results (from the past 240 hours)  Resp panel by RT-PCR (RSV, Flu A&B, Covid) Anterior Nasal Swab     Status: None   Collection Time: 03/01/24  3:43 AM   Specimen: Anterior Nasal Swab  Result Value Ref Range Status   SARS Coronavirus 2 by RT PCR NEGATIVE NEGATIVE Final    Comment: (NOTE) SARS-CoV-2 target nucleic acids are NOT DETECTED.  The SARS-CoV-2 RNA is generally detectable in upper respiratory specimens during the acute phase of infection. The lowest concentration of SARS-CoV-2 viral copies this assay can detect is 138 copies/mL. A negative result does not preclude SARS-Cov-2 infection and should not be used as the sole basis for treatment or other patient management decisions. A negative result may occur with  improper specimen collection/handling, submission of specimen other than nasopharyngeal swab, presence of viral mutation(s) within the areas targeted by this assay, and inadequate number of viral copies(<138 copies/mL). A negative result must be combined  with clinical observations, patient history, and epidemiological information. The expected result is Negative.  Fact Sheet for Patients:  bloggercourse.com  Fact Sheet for Healthcare Providers:   seriousbroker.it  This test is no t yet approved or cleared by the [place]  FDA and  has been authorized for detection and/or diagnosis of SARS-CoV-2 by FDA under an Emergency Use Authorization (EUA). This EUA will remain  in effect (meaning this test can be used) for the duration of the COVID-19 declaration under Section 564(b)(1) of the Act, 21 U.S.C.section 360bbb-3(b)(1), unless the authorization is terminated  or revoked sooner.       Influenza A by PCR NEGATIVE NEGATIVE Final   Influenza B by PCR NEGATIVE NEGATIVE Final    Comment: (NOTE) The Xpert Xpress SARS-CoV-2/FLU/RSV plus assay is intended as an aid in the diagnosis of influenza from Nasopharyngeal swab specimens and should not be used as a sole basis for treatment. Nasal washings and aspirates are unacceptable for Xpert Xpress SARS-CoV-2/FLU/RSV testing.  Fact Sheet for Patients: bloggercourse.com  Fact Sheet for Healthcare Providers: seriousbroker.it  This test is not yet approved or cleared by the [place]  FDA and has been authorized for detection and/or diagnosis of SARS-CoV-2 by FDA under an Emergency Use Authorization (EUA). This EUA will remain in effect (meaning this test can be used) for the duration of the COVID-19 declaration under Section 564(b)(1) of the Act, 21 U.S.C. section 360bbb-3(b)(1), unless the authorization is terminated or revoked.     Resp Syncytial Virus by PCR NEGATIVE NEGATIVE Final    Comment: (NOTE) Fact Sheet for Patients: bloggercourse.com  Fact Sheet for Healthcare Providers: seriousbroker.it  This test is not yet approved or cleared by the [place]  FDA and has been authorized for detection and/or diagnosis of SARS-CoV-2 by FDA under an Emergency Use Authorization (EUA). This EUA will remain in effect (meaning this test can be used) for  the duration of the COVID-19 declaration under Section 564(b)(1) of the Act, 21 U.S.C. section 360bbb-3(b)(1), unless the authorization is terminated or revoked.  Performed at [facility], [address] [place], [place] [address]      Time coordinating discharge: 32 min   SIGNED: Lorane Poland, DO [facility] 03/02/2024, 4:50 PM Pager   If 7PM-7AM, please contact night-coverage     [1]  Allergies Allergen Reactions   Morphine  Anxiety    agitation    "

## 2024-03-02 NOTE — TOC Initial Note (Addendum)
 Transition of Care [facility]) - Initial/Assessment Note    Patient Details  Name: Casey French MRN: [id number] Date of Birth: Jul 19, 1950  Transition of Care [facility]) CM/SW Contact:    Casey JAYSON Carpen, LCSW Phone Number: 03/02/2024, 11:58 AM  Clinical Narrative:  Readmission prevention screen complete. CSW met with patient. No family at bedside. CSW introduced role and explained that discharge planning would be discussed. PCP is Lynwood Null, MD. Patient drives himself to appointments. Pharmacy is [facility] on the corner of [address] and [address]. No issues affording medications. Patient lives home with his wife. His oldest daughter is also temporarily staying with them to help at home. No home health or DME use prior to admission. He does have a walker and canes at home if needed. Patient confirmed he does not use oxygen at home. He is currently on 3 L. Will follow for this potential discharge need. No further concerns. CSW will continue to follow patient for support and facilitate return home once stable. His wife will transport him home at discharge.         2:51 pm: Per MD, potential discharge home today. OT note shows sats which qualify him for home oxygen. Patient does not have a preferred agency. CSW ordered through [employer]. Patient declined home health PT.   4:05 pm: Per [employer], oxygen has been delivered.        Expected Discharge Plan: Home/Self Care Barriers to Discharge: Continued Medical Work up   Patient Goals and CMS Choice            Expected Discharge Plan and Services     Post Acute Care Choice: NA Living arrangements for the past 2 months: Single Family Home                                      Prior Living Arrangements/Services Living arrangements for the past 2 months: Single Family Home Lives with:: Adult Children, Spouse Patient language and need for interpreter reviewed:: Yes Do you feel safe going back to the place where you live?:  Yes      Need for Family Participation in Patient Care: Yes (Comment) Care giver support system in place?: Yes (comment)   Criminal Activity/Legal Involvement Pertinent to Current Situation/Hospitalization: No - Comment as needed  Activities of Daily Living   ADL Screening (condition at time of admission) Independently performs ADLs?: Yes (appropriate for developmental age) Is the patient deaf or have difficulty hearing?: No Does the patient have difficulty seeing, even when wearing glasses/contacts?: No Does the patient have difficulty concentrating, remembering, or making decisions?: No  Permission Sought/Granted                  Emotional Assessment Appearance:: Appears stated age Attitude/Demeanor/Rapport: Engaged, Gracious Affect (typically observed): Accepting, Appropriate, Calm, Pleasant Orientation: : Oriented to Self, Oriented to Place, Oriented to  Time, Oriented to Situation Alcohol / Substance Use: Not Applicable Psych Involvement: No (comment)  Admission diagnosis:  Shortness of breath [R06.02] Hypokalemia [E87.6] Acute pulmonary edema (HCC) [J81.0] Prolonged Q-T interval on ECG [R94.31] C. difficile diarrhea [A04.72] NSTEMI (non-ST elevated myocardial infarction) (HCC) [I21.4] Fluid overload [E87.70] Chest pain, unspecified type [R07.9] Patient Active Problem List   Diagnosis Date Noted   Fluid overload 03/01/2024   Depression 03/01/2024   Type 2 diabetes mellitus with end-stage renal disease (HCC) 03/01/2024   C. difficile enteritis 03/01/2024  Hypoxia 01/27/2024   Peritoneal dialysis status 01/27/2024   Passive suicidal ideations 01/24/2024   Coronary artery disease 12/22/2023   Low back pain 12/22/2023   Generalized weakness 12/21/2023   Essential hypertension 09/20/2023   Dyslipidemia 09/20/2023   Peripheral neuropathy 09/20/2023   Paroxysmal atrial fibrillation (HCC) 09/20/2023   CHF (congestive heart failure) (HCC) 09/02/2023   Acute blood  loss anemia 04/24/2023   Acute GI bleeding 04/22/2023   Acute colitis 10/07/2022   Elevated troponin 10/07/2022   High anion gap metabolic acidosis 10/07/2022   Acute metabolic encephalopathy 06/26/2022   CAP (community acquired pneumonia) 06/26/2022   Acute respiratory failure with hypoxia (HCC) 06/26/2022   Hypertensive urgency, malignant 06/26/2022   Acute pulmonary edema (HCC) 06/26/2022   Coag negative Staphylococcus bacteremia 04/22/2022   Diverticulitis 04/18/2022   Hypokalemia 04/18/2022   Hyperkalemia 03/08/2021   Peritoneal dialysis catheter dysfunction (HCC) ESRD on peritoneal dialysis 03/07/2021   PAF (paroxysmal atrial fibrillation) (HCC) on chronic anticoagulation 03/07/2021   Chest pain 12/02/2020   HTN (hypertension) 12/02/2020   HLD (hyperlipidemia) 12/02/2020   Gout 12/02/2020   ESRD (end stage renal disease) (HCC) 12/02/2020   B12 deficiency 07/10/2020   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   S/P hip replacement 04/22/2020   Hospital discharge follow-up 02/19/2020   Iron  deficiency anemia 02/19/2020   Preop cardiovascular exam 01/09/2020   Anemia in chronic kidney disease 11/15/2019   Benign hypertensive kidney disease with chronic kidney disease 11/15/2019   Chronic kidney disease, stage 3b (HCC) 11/15/2019   Proteinuria 11/15/2019   Hematochezia 05/15/2019   Chronic anticoagulation 05/15/2019   Atrial fibrillation, chronic (HCC) 05/15/2019   Chronic renal disease, stage IV (HCC) 05/15/2019   Unstable angina (HCC) 11/10/2016   S/P cardiac catheterization 09/09/2015   Angina at rest 05/01/2015   Hypertensive urgency 06/14/2014   AKI (acute kidney injury) 06/14/2014   Benign essential HTN 06/14/2014   Hyperlipidemia 06/14/2014   DM type 2 (diabetes mellitus, type 2) (HCC) 06/14/2014   CAD S/P percutaneous coronary angioplasty 06/14/2014   GERD without esophagitis 06/14/2014   Acute kidney failure 06/14/2014   Tremor 03/18/2014   Fall  at home 08/30/2013   SOB (shortness of breath) on exertion 08/30/2013   Closed head injury 03/24/2013   Unspecified injury of head, initial encounter 03/24/2013   PCP:  Valora Lynwood FALCON, MD Pharmacy:   [id number] [address], [place] - [address] AT [facility] OF [address] [place] [address] Phone: [phone] Fax: [phone]  [facility] [address] [place] [address] Phone: [phone] Fax: [phone]     Social Drivers of Health (SDOH) Social History: SDOH Screenings   Food Insecurity: Food Insecurity Present (03/01/2024)  Housing: Low Risk (03/01/2024)  Transportation Needs: No Transportation Needs (03/01/2024)  Utilities: Not At Risk (03/01/2024)  Financial Resource Strain: Medium Risk (11/28/2023)   Received from [facility]  Social Connections: Socially Isolated (03/01/2024)  Tobacco Use: Medium Risk (03/01/2024)   SDOH Interventions:     Readmission Risk Interventions    03/02/2024   11:56 AM 09/22/2023    3:18 PM 10/09/2022   11:58 AM  Readmission Risk Prevention Plan  Transportation Screening Complete Complete Complete  PCP or Specialist Appt within 3-5 Days   Complete  HRI or Home Care Consult   Complete  Social Work Consult for Recovery Care Planning/Counseling   Complete  Palliative Care Screening  Not Applicable  Medication Review (RN Care Manager) Complete Complete Complete  PCP or Specialist appointment within 3-5 days of discharge Complete Complete   SW Recovery Care/Counseling Consult Complete Complete   Palliative Care Screening Not Applicable Not Applicable   Skilled Nursing Facility Not Applicable Not Applicable

## 2024-03-02 NOTE — Progress Notes (Signed)
 ANTICOAGULATION CONSULT NOTE  Pharmacy Consult for heparin  infusion Indication: ACS/STEMI  Allergies[1]  Patient Measurements: Height: 5' 9 (175.3 cm) Weight: 59 kg (130 lb) IBW/kg (Calculated) : 70.7 HEPARIN  DW (KG): 59  Vital Signs: Temp: 97.5 F (36.4 C) (02/05 2307) Temp Source: Oral (02/05 2307) BP: 162/91 (02/06 0036) Pulse Rate: 71 (02/06 0036)  Labs: Recent Labs    03/01/24 0342 03/01/24 0343 03/01/24 1055 03/01/24 1520 03/02/24 0049  HGB  --  9.0* 8.4*  --   --   HCT  --  28.0* 25.9*  --   --   PLT  --  259 220  --   --   APTT 39*  --   --  47* 63*  LABPROT 15.4*  --   --   --   --   INR 1.2  --   --   --   --   HEPARINUNFRC <0.10*  --   --   --   --   CREATININE  --  8.46* 8.24*  --   --     Estimated Creatinine Clearance: 6.8 mL/min (A) (by C-G formula based on SCr of 8.24 mg/dL (H)).   Medical History: Past Medical History:  Diagnosis Date   Acute diverticulitis 06/14/2014   Acute ST elevation myocardial infarction (STEMI) of inferior wall (HCC) 2003   a.) PCI with Cypher stent to the RCA   Anemia of chronic renal failure    Aortic atherosclerosis    Bladder tumor    CAP (community acquired pneumonia) 06/2022   Cerebral microvascular disease    Coronary artery disease involving native coronary artery of native heart without angina pectoris 2003   a.) PCI with Cypher stent to the RCA 2003; b.) LHC 08/2003: 100% dRCA, 60% pLAD - med mgmt; c.) LHC/PCI 08/21/2015: occluded RCA at site of prior stent placement, 70% mLAD (FFR 0.79; 3.5 x 12 mm Xience DES), 50% dLAD (FFR 0.70); d.) MV 11/11/2016: no ischemia   Depression    Diverticulosis    End-stage renal disease on peritoneal dialysis (HCC)    GERD (gastroesophageal reflux disease)    GI bleed 05/15/2019   Gout    Heart murmur    Hematemesis 05/15/2019   Hepatic cyst    Hepatomegaly    Hyperlipidemia    Hypertension    Lacunar infarction (HCC) 02/11/2007   a.) CT head 02/11/2007: lacunar  infarction involving thalamus and external capsule region on the RIGHT; b.) MRI brain 02/03/2020: multiple remote lacunar infarcts involving the RIGHT basal ganglia, RIGHT thalamus, and LEFT middle cerebellar peduncle   Long-term use of aspirin  therapy    Myocardial bridge    On apixaban  therapy    Pancolitis 09/2022   Paroxysmal atrial fibrillation (HCC)    a.) CHA2DS2VASc = 6 (age, HTN, CVA x 2, prior MI/vascular disease, T2DM) as of 03/11/2023; b.) rate/rhythm maintained on oral metoprolol  succinate; chronically anticoagulated with dose reduced apixaban    Renal cyst, right    Sigmoid diverticulitis 03/2022   T2DM (type 2 diabetes mellitus) (HCC)    Traumatic skull fracture and resulting subarachnoid hemorrhage 03/24/2013   a.) MOI was slipping and falling on ice   Unstable angina (HCC)     Medications:  PTA Meds: Apixaban  2.5 mg BID, last does unknown  Assessment: Pt is a 74 yo male on home peritoneal dialysis presenting to ED c/o SOB and chest tightness found with elevated troponin and BNP levels.  Baseline HL <0.10  and aPTT 39 on 2/5 @  9350  Goal of Therapy:  Heparin  level 0.3-0.7 units/ml aPTT 66-102 seconds Monitor platelets by anticoagulation protocol: Yes   Date Time aPTT Rate/Comment  02/05 1520 47 Subtherapeutic @ 800 units/hr (gtt paused for ~1 hour around noon) 02/06 0049 63 Subtherapeutic  Plan:  Will give 900 unit bolus anf increase heparin  infusion to 1100 units/hr Will follow aPTT until correlation w/ HL confirmed Will check aPTT in 8 hr after infusion rate change HL & CBC daily while on heparin   Casey French, PharmD, [facility] 03/02/2024 2:42 AM           [1]  Allergies Allergen Reactions   Morphine  Anxiety    agitation

## 2024-03-02 NOTE — Care Management Obs Status (Signed)
 MEDICARE OBSERVATION STATUS NOTIFICATION   Patient Details  Name: Casey French MRN: [id number] Date of Birth: 10/27/1950   Medicare Observation Status Notification Given:  Yes    [name], LCSW 03/02/2024, 4:39 PM

## 2024-03-02 NOTE — Care Management CC44 (Signed)
"         Condition Code 44 Documentation Completed  Patient Details  Name: Casey French MRN: [id number] Date of Birth: [birth year]   Condition Code 44 given:  Yes Patient signature on Condition Code 44 notice:  Yes Documentation of 2 MD's agreement:  Yes Code 44 added to claim:  Yes    [name], LCSW 03/02/2024, 4:39 PM  "

## 2024-03-02 NOTE — TOC Transition Note (Signed)
 Transition of Care [facility]) - Discharge Note   Patient Details  Name: Casey French MRN: [id number] Date of Birth: 1950/10/25  Transition of Care [facility]) CM/SW Contact:  [name], LCSW Phone Number: 03/02/2024, 4:47 PM   Clinical Narrative:   Patient has orders to discharge home today. No further concerns. CSW signing off.  Final next level of care: Home/Self Care Barriers to Discharge: Barriers Resolved   Patient Goals and CMS Choice            Discharge Placement                Patient to be transferred to facility by: Wife   Patient and family notified of of transfer: 03/02/24  Discharge Plan and Services Additional resources added to the After Visit Summary for       Post Acute Care Choice: NA                               Social Drivers of Health (SDOH) Interventions SDOH Screenings   Food Insecurity: Food Insecurity Present (03/01/2024)  Housing: Low Risk (03/01/2024)  Transportation Needs: No Transportation Needs (03/01/2024)  Utilities: Not At Risk (03/01/2024)  Financial Resource Strain: Medium Risk (11/28/2023)   Received from [facility]  Social Connections: Socially Isolated (03/01/2024)  Tobacco Use: Medium Risk (03/01/2024)     Readmission Risk Interventions    03/02/2024   11:56 AM 09/22/2023    3:18 PM 10/09/2022   11:58 AM  Readmission Risk Prevention Plan  Transportation Screening Complete Complete Complete  PCP or Specialist Appt within 3-5 Days   Complete  HRI or Home Care Consult   Complete  Social Work Consult for Recovery Care Planning/Counseling   Complete  Palliative Care Screening   Not Applicable  Medication Review Oceanographer) Complete Complete Complete  PCP or Specialist appointment within 3-5 days of discharge Complete Complete   SW Recovery Care/Counseling Consult Complete Complete   Palliative Care Screening Not Applicable Not Applicable   Skilled Nursing Facility Not Applicable Not Applicable

## 2024-03-02 NOTE — Evaluation (Signed)
 Physical Therapy Evaluation Patient Details Name: Casey French MRN: [id number] DOB: Jun 02, 1950 Today's Date: 03/02/2024  History of Present Illness  Pt is a 74 year old male admitted with overload, chest pain, hypertensive urgency, later developed a-fib RVR     PMH significant for coronary artery disease, grade 2 diastolic dysfunction, end-stage renal disease on peritoneal dialysis, recent C. difficile, hypertension, dyslipidemia and paroxysmal atrial fibrillation and gout  Clinical Impression  Patient seated in recliner upon arrival to session (up since OT eval this AM); alert and oriented, follows commands and agreeable to participation with treatment session.  Generally fatigued and requests return to bed end of session Bilat UE/LE strength and ROM grossly symmetrical and WFL for basic transfers and gait; no focal weakness appreciated.  Able to complete bed mobility with mod indep; sit/stand, basic transfers and gait (40') without assist device, sup/mod indep. Demonstrates reciprocal stepping pattern with good step height/length; fair cadence; good gait confidence. Safely negotiates turns, obstalces without difficulty. Mild SOB with gait efforts; sats >90% on 3L throughout distance  Would benefit from skilled PT to address above deficits and promote optimal return to PLOF.; will maintain on caseload for remainder of hospital stay for progressive mobilization. Anticipate no formal PT needs upon discharge.        If plan is discharge home, recommend the following: A little help with walking and/or transfers;A little help with bathing/dressing/bathroom   Can travel by private vehicle        Equipment Recommendations    Recommendations for Other Services       Functional Status Assessment Patient has had a recent decline in their functional status and demonstrates the ability to make significant improvements in function in a reasonable and predictable amount of time.     Precautions /  Restrictions Precautions Precautions: Fall Recall of Precautions/Restrictions: Intact Restrictions Weight Bearing Restrictions Per Provider Order: No      Mobility  Bed Mobility Overal bed mobility: Independent                  Transfers Overall transfer level: Needs assistance Equipment used: None Transfers: Sit to/from Stand Sit to Stand: Supervision                Ambulation/Gait Ambulation/Gait assistance: Supervision Gait Distance (Feet): 40 Feet Assistive device: None         General Gait Details: reciprocal stepping pattern with good step height/length; fair cadence; good gait confidence.  Safely negotiates turns, obstalces without difficulty.  Mild SOB with gait efforts; sats >90% on 3L throughout distance  Stairs            Wheelchair Mobility     Tilt Bed    Modified Rankin (Stroke Patients Only)       Balance Overall balance assessment: Needs assistance Sitting-balance support: No upper extremity supported, Feet supported Sitting balance-Leahy Scale: Normal     Standing balance support: No upper extremity supported Standing balance-Leahy Scale: Good                               Pertinent Vitals/Pain Pain Assessment Pain Assessment: No/denies pain    Home Living Family/patient expects to be discharged to:: Private residence Living Arrangements: Spouse/significant other Available Help at Discharge: Family;Available 24 hours/day Type of Home: House Home Access: Stairs to enter Entrance Stairs-Rails: Can reach both Entrance Stairs-Number of Steps: 4   Home Layout: One level Home Equipment: Agricultural Consultant (2  wheels);Cane - single point;BSC/3in1      Prior Function Prior Level of Function : Driving;Independent/Modified Independent             Mobility Comments: amb with no AD, cane and RW for PRN use; denies fall history; no home O2       Extremity/Trunk Assessment   Upper Extremity  Assessment Upper Extremity Assessment: Overall WFL for tasks assessed    Lower Extremity Assessment Lower Extremity Assessment: Overall WFL for tasks assessed       Communication        Cognition Arousal: Alert Behavior During Therapy: WFL for tasks assessed/performed   PT - Cognitive impairments: No apparent impairments                         Following commands: Intact       Cueing       General Comments      Exercises     Assessment/Plan    PT Assessment Patient needs continued PT services  PT Problem List Decreased activity tolerance;Cardiopulmonary status limiting activity       PT Treatment Interventions      PT Goals (Current goals can be found in the Care Plan section)  Acute Rehab PT Goals Patient Stated Goal: to return home PT Goal Formulation: With patient Time For Goal Achievement: 03/16/24 Potential to Achieve Goals: Good    Frequency Min 1X/week     Co-evaluation               AM-PAC PT 6 Clicks Mobility  Outcome Measure Help needed turning from your back to your side while in a flat bed without using bedrails?: None Help needed moving from lying on your back to sitting on the side of a flat bed without using bedrails?: None Help needed moving to and from a bed to a chair (including a wheelchair)?: None Help needed standing up from a chair using your arms (e.g., wheelchair or bedside chair)?: None Help needed to walk in hospital room?: None Help needed climbing 3-5 steps with a railing? : A Little 6 Click Score: 23    End of Session Equipment Utilized During Treatment: Oxygen Activity Tolerance: Patient tolerated treatment well Patient left: in bed;with call bell/phone within reach;with bed alarm set   PT Visit Diagnosis: Other abnormalities of gait and mobility (R26.89)    Time: 8563-8551 PT Time Calculation (min) (ACUTE ONLY): 12 min   Charges:   PT Evaluation $PT Eval Low Complexity: 1 Low   PT General  Charges $$ ACUTE PT VISIT: 1 Visit        Joany Khatib H. Delores, PT, DPT, NCS 03/02/24, 11:53 PM [phone]

## 2024-03-02 NOTE — Progress Notes (Signed)
" °  Peritoneal Dialysis Post Treatment  Note     Post Treatment Weight: 62.5 kg   Consent signed and in chart.  PD treatment initiated via aseptic technique.    Patient is awake and alert. No complaints of pain.    PD exit site clean, dry and intact.  Gentamicin  and new dressing applied in the beginning of tx.  No issues with tx.  Hand-off given to the patient's nurse.       GEANNIE Sar LPN [facility]     "

## 2024-03-02 NOTE — Evaluation (Signed)
 Occupational Therapy Evaluation Patient Details Name: Casey French MRN: [id number] DOB: February 14, 1950 Today's Date: 03/02/2024   History of Present Illness   Pt is a 74 year old male admitted with overload, chest pain, hypertensive urgency, later developed a-fib RVR     PMH significant for coronary artery disease, grade 2 diastolic dysfunction, end-stage renal disease on peritoneal dialysis, recent C. difficile, hypertension, dyslipidemia and paroxysmal atrial fibrillation and gout     Clinical Impressions Chart reviewed to date, pt greeted semi supine in bed. PTA Pt is generally MOD I in ADL, amb with no AD. Pt presents with deficits in activity tolerance/endurance, Pt is performing ADL/functional mobility grossly at a CGA-supervision level. Education provided re: AE/DME for safer ADL completion. Pt will benefit from acute OT to address functional deficits/facilitate optimal ADL/functional mobility engagement.   Pt requested to trial amb on RA, spo2 >90% on RA in sitting, after amb to bathroom pt 87-88% on RA. Donned 3L with spo2 >90%. HR 80s bpm after mobility.      If plan is discharge home, recommend the following:   Assistance with cooking/housework;Help with stairs or ramp for entrance     Functional Status Assessment   Patient has had a recent decline in their functional status and demonstrates the ability to make significant improvements in function in a reasonable and predictable amount of time.     Equipment Recommendations   None recommended by OT     Recommendations for Other Services         Precautions/Restrictions   Precautions Precautions: Fall Recall of Precautions/Restrictions: Intact Restrictions Weight Bearing Restrictions Per Provider Order: No     Mobility Bed Mobility Overal bed mobility: Modified Independent                  Transfers Overall transfer level: Needs assistance   Transfers: Sit to/from Stand Sit to Stand:  Supervision                  Balance Overall balance assessment: Needs assistance Sitting-balance support: Feet supported Sitting balance-Leahy Scale: Normal     Standing balance support: No upper extremity supported Standing balance-Leahy Scale: Good                             ADL either performed or assessed with clinical judgement   ADL Overall ADL's : Needs assistance/impaired Eating/Feeding: Set up   Grooming: Supervision/safety;Standing Grooming Details (indicate cue type and reason): with no AD at sink level             Lower Body Dressing: Modified independent;Sitting/lateral leans   Toilet Transfer: Modified Independent;Ambulation;Regular Toilet   Toileting- Architect and Hygiene: Supervision/safety;Sitting/lateral lean Toileting - Clothing Manipulation Details (indicate cue type and reason): continent BM on toilet     Functional mobility during ADLs: Supervision/safety (approx 20' two attempts with no AD, pushes IV pole but does not require for balance)       Vision Patient Visual Report: No change from baseline       Perception         Praxis         Pertinent Vitals/Pain Pain Assessment Pain Assessment: No/denies pain     Extremity/Trunk Assessment Upper Extremity Assessment Upper Extremity Assessment: Overall WFL for tasks assessed   Lower Extremity Assessment Lower Extremity Assessment: Generalized weakness       Communication Communication Communication: No apparent difficulties   Cognition Arousal: Alert Behavior  During Therapy: WFL for tasks assessed/performed Cognition: No apparent impairments                               Following commands: Intact       Cueing  General Comments   Cueing Techniques: Verbal cues      Exercises Other Exercises Other Exercises: edu re role of OT, role of rehab, discharge recommendations   Shoulder Instructions      Home Living  Family/patient expects to be discharged to:: Private residence Living Arrangements: Spouse/significant other Available Help at Discharge: Family;Available 24 hours/day Type of Home: House Home Access: Stairs to enter [employer] of Steps: 4 Entrance Stairs-Rails: Can reach both Home Layout: One level     Bathroom Shower/Tub: Chief Strategy Officer: Standard     Home Equipment: Agricultural Consultant (2 wheels);Cane - single point;BSC/3in1          Prior Functioning/Environment Prior Level of Function : Driving;Independent/Modified Independent             Mobility Comments: amb with no AD, cane and RW for PRN use ADLs Comments: MOD I-I in ADL/IADL    OT Problem List: Decreased activity tolerance;Impaired balance (sitting and/or standing)   OT Treatment/Interventions: Self-care/ADL training;Therapeutic exercise;Energy conservation;DME and/or AE instruction;Balance training;Patient/family education;Therapeutic activities      OT Goals(Current goals can be found in the care plan section)   Acute Rehab OT Goals Patient Stated Goal: go home OT Goal Formulation: With patient Time For Goal Achievement: 03/16/24 Potential to Achieve Goals: Good ADL Goals Pt Will Perform Grooming: with modified independence;sitting;standing Pt Will Perform Lower Body Dressing: with modified independence;sitting/lateral leans;sit to/from stand Pt Will Transfer to Toilet: with modified independence;ambulating Pt Will Perform Toileting - Clothing Manipulation and hygiene: with modified independence;sit to/from stand;sitting/lateral leans   OT Frequency:  Min 1X/week    Co-evaluation              AM-PAC OT 6 Clicks Daily Activity     Outcome Measure Help from another person eating meals?: None Help from another person taking care of personal grooming?: None Help from another person toileting, which includes using toliet, bedpan, or urinal?: None Help from another  person bathing (including washing, rinsing, drying)?: None Help from another person to put on and taking off regular upper body clothing?: None Help from another person to put on and taking off regular lower body clothing?: A Little 6 Click Score: 23   End of Session Equipment Utilized During Treatment: Oxygen Nurse Communication: Mobility status  Activity Tolerance: Patient tolerated treatment well Patient left: in chair;with call bell/phone within reach;with chair alarm set  OT Visit Diagnosis: Other abnormalities of gait and mobility (R26.89)                Time: 8895-8864 OT Time Calculation (min): 31 min Charges:  OT General Charges $OT Visit: 1 Visit OT Evaluation $OT Eval Moderate Complexity: 1 Mod  Therisa Sheffield, OTD OTR/L  03/02/24, 12:55 PM

## 2024-03-02 NOTE — Progress Notes (Cosign Needed Addendum)
 ANTICOAGULATION CONSULT NOTE  Pharmacy Consult for heparin  infusion Indication: ACS/STEMI  Allergies[1]  Patient Measurements: Height: 5' 9 (175.3 cm) Weight: 61.1 kg (134 lb 12.8 oz) IBW/kg (Calculated) : 70.7 HEPARIN  DW (KG): 59  Vital Signs: Temp: 98.2 F (36.8 C) (02/06 0904) Temp Source: Oral (02/06 0556) BP: 126/79 (02/06 1436) Pulse Rate: 89 (02/06 1436)  Labs: Recent Labs    03/01/24 0342 03/01/24 0343 03/01/24 0343 03/01/24 1055 03/01/24 1520 03/02/24 0049 03/02/24 0415 03/02/24 1257  HGB  --  9.0*   < > 8.4*  --   --  7.8*  --   HCT  --  28.0*  --  25.9*  --   --  24.3*  --   PLT  --  259  --  220  --   --  213  --   APTT 39*  --   --   --  47* 63*  --  73*  LABPROT 15.4*  --   --   --   --   --   --   --   INR 1.2  --   --   --   --   --   --   --   HEPARINUNFRC <0.10*  --   --   --   --   --   --  0.22*  CREATININE  --  8.46*  --  8.24*  --   --  7.59*  --    < > = values in this interval not displayed.    Estimated Creatinine Clearance: 7.6 mL/min (A) (by C-G formula based on SCr of 7.59 mg/dL (H)).   Medical History: Past Medical History:  Diagnosis Date   Acute diverticulitis 06/14/2014   Acute ST elevation myocardial infarction (STEMI) of inferior wall (HCC) 2003   a.) PCI with Cypher stent to the RCA   Anemia of chronic renal failure    Aortic atherosclerosis    Bladder tumor    CAP (community acquired pneumonia) 06/2022   Cerebral microvascular disease    Coronary artery disease involving native coronary artery of native heart without angina pectoris 2003   a.) PCI with Cypher stent to the RCA 2003; b.) LHC 08/2003: 100% dRCA, 60% pLAD - med mgmt; c.) LHC/PCI 08/21/2015: occluded RCA at site of prior stent placement, 70% mLAD (FFR 0.79; 3.5 x 12 mm Xience DES), 50% dLAD (FFR 0.70); d.) MV 11/11/2016: no ischemia   Depression    Diverticulosis    End-stage renal disease on peritoneal dialysis (HCC)    GERD (gastroesophageal reflux  disease)    GI bleed 05/15/2019   Gout    Heart murmur    Hematemesis 05/15/2019   Hepatic cyst    Hepatomegaly    Hyperlipidemia    Hypertension    Lacunar infarction (HCC) 02/11/2007   a.) CT head 02/11/2007: lacunar infarction involving thalamus and external capsule region on the RIGHT; b.) MRI brain 02/03/2020: multiple remote lacunar infarcts involving the RIGHT basal ganglia, RIGHT thalamus, and LEFT middle cerebellar peduncle   Long-term use of aspirin  therapy    Myocardial bridge    On apixaban  therapy    Pancolitis 09/2022   Paroxysmal atrial fibrillation (HCC)    a.) CHA2DS2VASc = 6 (age, HTN, CVA x 2, prior MI/vascular disease, T2DM) as of 03/11/2023; b.) rate/rhythm maintained on oral metoprolol  succinate; chronically anticoagulated with dose reduced apixaban    Renal cyst, right    Sigmoid diverticulitis 03/2022   T2DM (type 2 diabetes  mellitus) (HCC)    Traumatic skull fracture and resulting subarachnoid hemorrhage 03/24/2013   a.) MOI was slipping and falling on ice   Unstable angina (HCC)     Medications:  PTA Meds: Apixaban  2.5 mg BID, last does unknown  Assessment: Pt is a 74 yo male on home peritoneal dialysis presenting to ED c/o SOB and chest tightness found with elevated troponin and BNP levels.  Baseline HL <0.10  and aPTT 39 on 2/5 @ 0649  Goal of Therapy:  Heparin  level 0.3-0.7 units/ml aPTT 66-102 seconds Monitor platelets by anticoagulation protocol: Yes   Date Time aPTT Rate/Comment  02/05 1520 47 Subtherapeutic @ 800 units/hr (gtt paused for ~1 hour around noon) 02/06 0049 63 Subtherapeutic 02/06 1257 73 Therapeutic x 1, HL 0.22  Plan:  - Will continue heparin  infusion at 1100 units/hr - Will stop Heparin  gtt on 2/7 @ 0600 and transition back to PTA Eliquis   - Will follow aPTT until correlation w/ HL confirmed - Will recheck aPTT in 8 hr  - HL & CBC daily while on heparin    Ransom Blanch PGY-1 Pharmacy Resident  [place] [facility]  [facility]  03/02/2024 2:58 PM            [1]  Allergies Allergen Reactions   Morphine  Anxiety    agitation

## 2024-03-02 NOTE — Plan of Care (Signed)
 Pt is alert oriented x 4, pt has been complaining of headache, prn oxycodone  and prn tylenol  given per order, effective. BP elevated, prn labetalol  given per order, effective. Call button within reach. No distress noted.    Problem: Education: Goal: Knowledge of General Education information will improve Description: Including pain rating scale, medication(s)/side effects and non-pharmacologic comfort measures Outcome: Progressing   Problem: Health Behavior/Discharge Planning: Goal: Ability to manage health-related needs will improve Outcome: Progressing   Problem: Clinical Measurements: Goal: Ability to maintain clinical measurements within normal limits will improve Outcome: Progressing Goal: Will remain free from infection Outcome: Progressing Goal: Diagnostic test results will improve Outcome: Progressing Goal: Respiratory complications will improve Outcome: Progressing Goal: Cardiovascular complication will be avoided Outcome: Progressing   Problem: Activity: Goal: Risk for activity intolerance will decrease Outcome: Progressing   Problem: Nutrition: Goal: Adequate nutrition will be maintained Outcome: Progressing   Problem: Coping: Goal: Level of anxiety will decrease Outcome: Progressing   Problem: Elimination: Goal: Will not experience complications related to bowel motility Outcome: Progressing Goal: Will not experience complications related to urinary retention Outcome: Progressing   Problem: Pain Managment: Goal: General experience of comfort will improve and/or be controlled Outcome: Progressing   Problem: Safety: Goal: Ability to remain free from injury will improve Outcome: Progressing   Problem: Skin Integrity: Goal: Risk for impaired skin integrity will decrease Outcome: Progressing

## 2024-03-02 NOTE — Progress Notes (Signed)
 " [facility]  ROUNDING NOTE   Subjective:   Casey French is a 74 y.o. male with past medical history of coronary artery disease, hypertension, hyperlipidemia, diabetes mellitus type 2, gout, GERD, ESRD on PD, anemia chronic kidney disease, secondary hyperparathyroidism who presented with shortness of breath and chest tightness. He has been admitted for Shortness of breath [R06.02] Hypokalemia [E87.6] Acute pulmonary edema (HCC) [J81.0] Prolonged Q-T interval on ECG [R94.31] C. difficile diarrhea [A04.72] NSTEMI (non-ST elevated myocardial infarction) (HCC) [I21.4] Fluid overload [E87.70] Chest pain, unspecified type [R07.9]  Patient is known to our practice and receives outpatient treatment through the [facility], supervised by Dr Douglas.   Update: Patient sitting up in bed States he feels well today, better than arrival Completed dialysis overnight, UF    Objective:  Vital signs in last 24 hours:  Temp:  [97.5 F (36.4 C)-98.5 F (36.9 C)] 98.2 F (36.8 C) (02/06 0904) Pulse Rate:  [70-94] 89 (02/06 1436) Resp:  [17-28] 17 (02/06 0904) BP: (126-180)/(61-135) 126/79 (02/06 1436) SpO2:  [97 %-99 %] 99 % (02/06 1436) Weight:  [61.1 kg] 61.1 kg (02/06 0500)  Weight change: 2.177 kg Filed Weights   03/01/24 0337 03/02/24 0500  Weight: 59 kg 61.1 kg    Intake/Output: I/O last 3 completed shifts: In: 361.5 [P.O.:300; I.V.:61.5] Out: 350 [Urine:100; Emesis/NG output:250]   Intake/Output this shift:  Total I/O In: 0  Out: 623 [Urine:300; Other:323]  Physical Exam: General: NAD  Head: Normocephalic, atraumatic. Moist oral mucosal membranes  Eyes: Anicteric  Lungs:  Diminished [place]oll O2  Heart: Regular rate and rhythm  Abdomen:  Soft, nontender  Extremities:  No peripheral edema.  Neurologic: Awake, alert, conversant  Skin: Warm,dry, no rash  Access: [facility]    Basic Metabolic Panel: Recent Labs  Lab 03/01/24 0343 03/01/24 1055  03/02/24 0415  NA 143 144 141  K 2.6* 3.2* 2.9*  CL 108 112* 108  CO2 13* 15* 18*  GLUCOSE 190* 86 135*  BUN 69* 65* 59*  CREATININE 8.46* 8.24* 7.59*  CALCIUM  7.0* 6.9* 7.0*  MG 1.9  --  1.7  PHOS  --   --  5.7*    Liver Function Tests: Recent Labs  Lab 03/01/24 0343 03/01/24 1055 03/02/24 0415  AST 31 28 20   ALT 14 12 11   ALKPHOS 178* 159* 142*  BILITOT <0.2 <0.2 <0.2  PROT 5.9* 5.4* 4.7*  ALBUMIN 3.3* 3.0* 2.6*   Recent Labs  Lab 03/01/24 0343  LIPASE 49   No results for input(s): AMMONIA in the last 168 hours.  CBC: Recent Labs  Lab 03/01/24 0343 03/01/24 1055 03/02/24 0415  WBC 10.0 10.0 8.4  NEUTROABS 8.4* 8.4*  --   HGB 9.0* 8.4* 7.8*  HCT 28.0* 25.9* 24.3*  MCV 92.4 93.2 93.1  PLT 259 220 213    Cardiac Enzymes: No results for input(s): CKTOTAL, CKMB, CKMBINDEX, TROPONINI in the last 168 hours.  BNP: Invalid input(s): POCBNP  CBG: No results for input(s): GLUCAP in the last 168 hours.  Microbiology: Results for orders placed or performed during the hospital encounter of 03/01/24  Resp panel by RT-PCR (RSV, Flu A&B, Covid) Anterior Nasal Swab     Status: None   Collection Time: 03/01/24  3:43 AM   Specimen: Anterior Nasal Swab  Result Value Ref Range Status   SARS Coronavirus 2 by RT PCR NEGATIVE NEGATIVE Final    Comment: (NOTE) SARS-CoV-2 target nucleic acids are NOT DETECTED.  The  SARS-CoV-2 RNA is generally detectable in upper respiratory specimens during the acute phase of infection. The lowest concentration of SARS-CoV-2 viral copies this assay can detect is 138 copies/mL. A negative result does not preclude SARS-Cov-2 infection and should not be used as the sole basis for treatment or other patient management decisions. A negative result may occur with  improper specimen collection/handling, submission of specimen other than nasopharyngeal swab, presence of viral mutation(s) within the areas targeted by this assay,  and inadequate number of viral copies(<138 copies/mL). A negative result must be combined with clinical observations, patient history, and epidemiological information. The expected result is Negative.  Fact Sheet for Patients:  bloggercourse.com  Fact Sheet for Healthcare Providers:  seriousbroker.it  This test is no t yet approved or cleared by the [place]  FDA and  has been authorized for detection and/or diagnosis of SARS-CoV-2 by FDA under an Emergency Use Authorization (EUA). This EUA will remain  in effect (meaning this test can be used) for the duration of the COVID-19 declaration under Section 564(b)(1) of the Act, 21 U.S.C.section 360bbb-3(b)(1), unless the authorization is terminated  or revoked sooner.       Influenza A by PCR NEGATIVE NEGATIVE Final   Influenza B by PCR NEGATIVE NEGATIVE Final    Comment: (NOTE) The Xpert Xpress SARS-CoV-2/FLU/RSV plus assay is intended as an aid in the diagnosis of influenza from Nasopharyngeal swab specimens and should not be used as a sole basis for treatment. Nasal washings and aspirates are unacceptable for Xpert Xpress SARS-CoV-2/FLU/RSV testing.  Fact Sheet for Patients: bloggercourse.com  Fact Sheet for Healthcare Providers: seriousbroker.it  This test is not yet approved or cleared by the [place]  FDA and has been authorized for detection and/or diagnosis of SARS-CoV-2 by FDA under an Emergency Use Authorization (EUA). This EUA will remain in effect (meaning this test can be used) for the duration of the COVID-19 declaration under Section 564(b)(1) of the Act, 21 U.S.C. section 360bbb-3(b)(1), unless the authorization is terminated or revoked.     Resp Syncytial Virus by PCR NEGATIVE NEGATIVE Final    Comment: (NOTE) Fact Sheet for Patients: bloggercourse.com  Fact Sheet for  Healthcare Providers: seriousbroker.it  This test is not yet approved or cleared by the [place]  FDA and has been authorized for detection and/or diagnosis of SARS-CoV-2 by FDA under an Emergency Use Authorization (EUA). This EUA will remain in effect (meaning this test can be used) for the duration of the COVID-19 declaration under Section 564(b)(1) of the Act, 21 U.S.C. section 360bbb-3(b)(1), unless the authorization is terminated or revoked.  Performed at [facility], [address] [place], [place] [address]     Coagulation Studies: Recent Labs    03/01/24 0342  LABPROT 15.4*  INR 1.2    Urinalysis: Recent Labs    03/01/24 0400  COLORURINE COLORLESS*  LABSPEC 1.005  PHURINE 7.0  GLUCOSEU >=500*  HGBUR SMALL*  BILIRUBINUR NEGATIVE  KETONESUR 5*  PROTEINUR >=300*  NITRITE NEGATIVE  LEUKOCYTESUR NEGATIVE      Imaging: DG Chest Portable 1 View Result Date: 03/01/2024 EXAM: 1 VIEW(S) XRAY OF THE CHEST 03/01/2024 04:02:00 AM COMPARISON: Portable chest 01/25/2024. CLINICAL HISTORY: SOB Shortness of breath. FINDINGS: LINES, TUBES AND DEVICES: Telemetry leads overlie the chest. LUNGS AND PLEURA: There are bilateral small pleural effusions with improvement compared with 01/25/2024. Development of mild interstitial edema in the lung bases. There are low lung volumes, continued patchy atelectasis or airspace disease in the hypoinflated lung bases. No  pneumothorax. HEART AND MEDIASTINUM: Stable cardiomegaly. There is increased central vascular congestion. Mediastinum is stable with aortic atherosclerosis. BONES AND SOFT TISSUES: Osteopenia and degenerative change. IMPRESSION: 1. Increased central vascular congestion and development of mild interstitial edema in the lung bases. 2. Bilateral small pleural effusions, improved compared to 01/25/24. 3. Low inspiration with continued patchy atelectasis or infiltrates in the hypoinflated bases. 4.  Stable cardiomegaly. Electronically signed by: Francis Quam MD 03/01/2024 04:15 AM EST RP Workstation: [id number]     Medications:    dialysis solution 2.5% low-MG/low-CA     heparin  1,100 Units/hr (03/02/24 0916)    amLODipine   10 mg Oral Daily   [START ON 03/03/2024] apixaban   2.5 mg Oral BID   aspirin  EC  81 mg Oral Daily   calcitRIOL   0.25 mcg Oral Daily   calcium  carbonate  2 tablet Oral TID WC   Chlorhexidine  Gluconate Cloth  6 each Topical Daily   folic acid   1 mg Oral Daily   furosemide   60 mg Intravenous Q12H   gabapentin   300 mg Oral QHS   gentamicin  cream  1 Application Topical Daily   isosorbide  mononitrate  120 mg Oral BID   metoprolol  tartrate  75 mg Oral BID   pantoprazole  (PROTONIX ) IV  40 mg Intravenous Q12H   potassium chloride   40 mEq Oral Once   sevelamer  carbonate  800 mg Oral TID WC   sodium bicarbonate   1,300 mg Oral BID   vancomycin   125 mg Oral QID   acetaminophen  **OR** acetaminophen , hydrALAZINE , labetalol , magnesium  hydroxide, nitroGLYCERIN , oxyCODONE , traZODone   Assessment/ Plan:  [name] is a 74 y.o.  male  with past medical history of coronary artery disease, hypertension, hyperlipidemia, diabetes mellitus type 2, gout, GERD, ESRD on PD, anemia chronic kidney disease, secondary hyperparathyroidism who presented with SOB and has been admitted for Shortness of breath [R06.02] Hypokalemia [E87.6] Acute pulmonary edema (HCC) [J81.0] Prolonged Q-T interval on ECG [R94.31] C. difficile diarrhea [A04.72] NSTEMI (non-ST elevated myocardial infarction) (HCC) [I21.4] Fluid overload [E87.70] Chest pain, unspecified type [R07.9]   End stage renal disease on peritoneal dialysis.  Treatment received overnight, uf . Patient would like to continue peritoneal dialysis at home.  Will continue nightly treatments during this admission.  2.  Hypokalemia, likely secondary to vomiting, GI losses potassium 2.6 on ED arrival..  Potassium remains  2.9, will order one-time dose of potassium chloride  40 mill equivalents.  3.  Volume overload, pulmonary edema seen on chest x-ray.  Last PD treatment on Monday.  Will proceed with nightly treatments to facilitate fluid removal.  Volume status has improved along with respiratory status.  Patient currently on 2 L nasal cannula  4. Anemia of chronic kidney disease Lab Results  Component Value Date   HGB 7.8 (L) 03/02/2024    Patient does receive Mircera at outpatient clinic.  5.  Secondary Hyperparathyroidism: with outpatient labs: None available Lab Results  Component Value Date   CALCIUM  7.0 (L) 03/02/2024   CAION 0.95 (L) 04/05/2023   PHOS 5.7 (H) 03/02/2024    Hypocalcemia noted, 7.0.  Corrected calcium  8.1.  Phosphorus slightly elevated.  Continue calcitriol , calcium  carbonate, and sevelamer  as prescribed.  Will continue to monitor for now.  6.  Hypertensive urgency.  Blood pressure 133/108 on ED arrival.  Currently prescribed amlodipine , furosemide , isosorbide , and metoprolol .  Blood pressure acceptable, 151/79 for this patient.   LOS: 1 Yuvaan Olander 2/6/20262:38 PM   "
